# Patient Record
Sex: Female | Born: 1965 | Race: Black or African American | Hispanic: No | Smoking: Former smoker
Health system: Southern US, Community
[De-identification: ages and names within clinical notes are randomized; demographics above are authoritative.]

## PROBLEM LIST (undated history)

## (undated) DIAGNOSIS — F1990 Other psychoactive substance use, unspecified, uncomplicated: Secondary | ICD-10-CM

## (undated) DIAGNOSIS — Z86718 Personal history of other venous thrombosis and embolism: Secondary | ICD-10-CM

## (undated) DIAGNOSIS — F319 Bipolar disorder, unspecified: Secondary | ICD-10-CM

## (undated) DIAGNOSIS — E669 Obesity, unspecified: Secondary | ICD-10-CM

## (undated) DIAGNOSIS — M1711 Unilateral primary osteoarthritis, right knee: Secondary | ICD-10-CM

## (undated) DIAGNOSIS — R569 Unspecified convulsions: Secondary | ICD-10-CM

## (undated) DIAGNOSIS — J189 Pneumonia, unspecified organism: Secondary | ICD-10-CM

## (undated) DIAGNOSIS — M549 Dorsalgia, unspecified: Secondary | ICD-10-CM

## (undated) DIAGNOSIS — R5383 Other fatigue: Secondary | ICD-10-CM

## (undated) DIAGNOSIS — J40 Bronchitis, not specified as acute or chronic: Secondary | ICD-10-CM

## (undated) DIAGNOSIS — F419 Anxiety disorder, unspecified: Secondary | ICD-10-CM

## (undated) DIAGNOSIS — N946 Dysmenorrhea, unspecified: Secondary | ICD-10-CM

## (undated) DIAGNOSIS — E785 Hyperlipidemia, unspecified: Secondary | ICD-10-CM

## (undated) DIAGNOSIS — I10 Essential (primary) hypertension: Secondary | ICD-10-CM

## (undated) DIAGNOSIS — R0602 Shortness of breath: Secondary | ICD-10-CM

## (undated) DIAGNOSIS — M2550 Pain in unspecified joint: Secondary | ICD-10-CM

## (undated) DIAGNOSIS — J101 Influenza due to other identified influenza virus with other respiratory manifestations: Secondary | ICD-10-CM

## (undated) DIAGNOSIS — Z720 Tobacco use: Secondary | ICD-10-CM

## (undated) DIAGNOSIS — R7303 Prediabetes: Secondary | ICD-10-CM

## (undated) DIAGNOSIS — I82402 Acute embolism and thrombosis of unspecified deep veins of left lower extremity: Secondary | ICD-10-CM

## (undated) DIAGNOSIS — F1010 Alcohol abuse, uncomplicated: Secondary | ICD-10-CM

## (undated) DIAGNOSIS — I639 Cerebral infarction, unspecified: Secondary | ICD-10-CM

## (undated) DIAGNOSIS — D179 Benign lipomatous neoplasm, unspecified: Secondary | ICD-10-CM

## (undated) DIAGNOSIS — J441 Chronic obstructive pulmonary disease with (acute) exacerbation: Secondary | ICD-10-CM

## (undated) HISTORY — DX: Alcohol abuse, uncomplicated: F10.10

## (undated) HISTORY — DX: Prediabetes: R73.03

## (undated) HISTORY — DX: Dorsalgia, unspecified: M54.9

## (undated) HISTORY — DX: Shortness of breath: R06.02

## (undated) HISTORY — DX: Essential (primary) hypertension: I10

## (undated) HISTORY — PX: CYSTECTOMY: SUR359

## (undated) HISTORY — DX: Personal history of other venous thrombosis and embolism: Z86.718

## (undated) HISTORY — DX: Benign lipomatous neoplasm, unspecified: D17.9

## (undated) HISTORY — DX: Other psychoactive substance use, unspecified, uncomplicated: F19.90

## (undated) HISTORY — DX: Pain in unspecified joint: M25.50

## (undated) HISTORY — DX: Obesity, unspecified: E66.9

## (undated) HISTORY — DX: Bipolar disorder, unspecified: F31.9

## (undated) HISTORY — PX: TUBAL LIGATION: SHX77

## (undated) HISTORY — DX: Bronchitis, not specified as acute or chronic: J40

## (undated) HISTORY — DX: Unspecified convulsions: R56.9

## (undated) HISTORY — DX: Tobacco use: Z72.0

## (undated) HISTORY — DX: Hyperlipidemia, unspecified: E78.5

## (undated) HISTORY — DX: Cerebral infarction, unspecified: I63.9

## (undated) HISTORY — DX: Unilateral primary osteoarthritis, right knee: M17.11

## (undated) HISTORY — DX: Other fatigue: R53.83

---
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
## 2003-08-10 ENCOUNTER — Encounter: Admission: RE | Admit: 2003-08-10 | Discharge: 2003-08-10 | Payer: Self-pay | Admitting: Internal Medicine

## 2003-10-12 ENCOUNTER — Encounter: Admission: RE | Admit: 2003-10-12 | Discharge: 2003-10-12 | Payer: Self-pay | Admitting: Internal Medicine

## 2003-11-09 ENCOUNTER — Emergency Department (HOSPITAL_COMMUNITY): Admission: EM | Admit: 2003-11-09 | Discharge: 2003-11-09 | Payer: Self-pay | Admitting: Emergency Medicine

## 2005-03-04 ENCOUNTER — Encounter (INDEPENDENT_AMBULATORY_CARE_PROVIDER_SITE_OTHER): Payer: Self-pay | Admitting: *Deleted

## 2005-03-04 LAB — CONVERTED CEMR LAB

## 2005-03-27 ENCOUNTER — Other Ambulatory Visit: Admission: RE | Admit: 2005-03-27 | Discharge: 2005-03-27 | Payer: Self-pay | Admitting: Family Medicine

## 2005-03-27 ENCOUNTER — Ambulatory Visit: Payer: Self-pay | Admitting: Family Medicine

## 2005-04-03 ENCOUNTER — Ambulatory Visit: Payer: Self-pay | Admitting: Family Medicine

## 2005-04-30 ENCOUNTER — Ambulatory Visit: Payer: Self-pay | Admitting: Sports Medicine

## 2005-08-09 ENCOUNTER — Ambulatory Visit: Payer: Self-pay | Admitting: Family Medicine

## 2005-08-17 ENCOUNTER — Emergency Department (HOSPITAL_COMMUNITY): Admission: EM | Admit: 2005-08-17 | Discharge: 2005-08-17 | Payer: Self-pay | Admitting: Emergency Medicine

## 2005-08-30 ENCOUNTER — Ambulatory Visit: Payer: Self-pay | Admitting: Family Medicine

## 2005-08-30 ENCOUNTER — Encounter: Admission: RE | Admit: 2005-08-30 | Discharge: 2005-08-30 | Payer: Self-pay | Admitting: Family Medicine

## 2005-09-12 ENCOUNTER — Ambulatory Visit: Payer: Self-pay | Admitting: Family Medicine

## 2005-11-19 ENCOUNTER — Ambulatory Visit: Payer: Self-pay | Admitting: Family Medicine

## 2006-02-07 ENCOUNTER — Ambulatory Visit: Payer: Self-pay | Admitting: Family Medicine

## 2006-11-12 ENCOUNTER — Ambulatory Visit: Payer: Self-pay | Admitting: Family Medicine

## 2006-11-12 ENCOUNTER — Other Ambulatory Visit: Admission: RE | Admit: 2006-11-12 | Discharge: 2006-11-12 | Payer: Self-pay | Admitting: Family Medicine

## 2006-11-24 ENCOUNTER — Ambulatory Visit: Payer: Self-pay | Admitting: Sports Medicine

## 2006-11-24 ENCOUNTER — Encounter (INDEPENDENT_AMBULATORY_CARE_PROVIDER_SITE_OTHER): Payer: Self-pay | Admitting: Family Medicine

## 2006-11-24 LAB — CONVERTED CEMR LAB
BUN: 10 mg/dL (ref 6–23)
CO2: 22 meq/L (ref 19–32)
Calcium: 9.1 mg/dL (ref 8.4–10.5)
Chloride: 105 meq/L (ref 96–112)
Cholesterol: 209 mg/dL — ABNORMAL HIGH (ref 0–200)
Creatinine, Ser: 0.7 mg/dL (ref 0.40–1.20)
Glucose, Bld: 81 mg/dL (ref 70–99)
HDL: 36 mg/dL — ABNORMAL LOW (ref >39)
LDL Cholesterol: 144 mg/dL — ABNORMAL HIGH (ref 0–99)
Potassium: 4.3 meq/L (ref 3.5–5.3)
Sodium: 140 meq/L (ref 135–145)
Total CHOL/HDL Ratio: 5.8
Triglycerides: 144 mg/dL (ref <150)
VLDL: 29 mg/dL (ref 0–40)

## 2006-12-09 ENCOUNTER — Encounter: Admission: RE | Admit: 2006-12-09 | Discharge: 2006-12-09 | Payer: Self-pay | Admitting: Family Medicine

## 2006-12-10 ENCOUNTER — Ambulatory Visit: Payer: Self-pay | Admitting: Family Medicine

## 2006-12-19 ENCOUNTER — Ambulatory Visit: Payer: Self-pay | Admitting: Family Medicine

## 2007-01-01 DIAGNOSIS — Z6841 Body Mass Index (BMI) 40.0 and over, adult: Secondary | ICD-10-CM

## 2007-01-01 DIAGNOSIS — I10 Essential (primary) hypertension: Secondary | ICD-10-CM | POA: Insufficient documentation

## 2007-01-02 ENCOUNTER — Encounter (INDEPENDENT_AMBULATORY_CARE_PROVIDER_SITE_OTHER): Payer: Self-pay | Admitting: *Deleted

## 2007-01-26 ENCOUNTER — Ambulatory Visit: Payer: Self-pay | Admitting: Family Medicine

## 2007-02-23 ENCOUNTER — Telehealth: Payer: Self-pay | Admitting: *Deleted

## 2007-02-24 ENCOUNTER — Encounter: Admission: RE | Admit: 2007-02-24 | Discharge: 2007-02-24 | Payer: Self-pay | Admitting: Sports Medicine

## 2007-02-24 ENCOUNTER — Ambulatory Visit: Payer: Self-pay | Admitting: Family Medicine

## 2007-02-24 ENCOUNTER — Ambulatory Visit (HOSPITAL_COMMUNITY): Admission: RE | Admit: 2007-02-24 | Discharge: 2007-02-24 | Payer: Self-pay | Admitting: Family Medicine

## 2007-03-03 ENCOUNTER — Telehealth: Payer: Self-pay | Admitting: *Deleted

## 2007-03-05 ENCOUNTER — Ambulatory Visit: Payer: Self-pay | Admitting: Sports Medicine

## 2007-03-19 ENCOUNTER — Ambulatory Visit: Payer: Self-pay | Admitting: Family Medicine

## 2007-03-23 ENCOUNTER — Ambulatory Visit: Payer: Self-pay | Admitting: Sports Medicine

## 2007-03-23 ENCOUNTER — Telehealth (INDEPENDENT_AMBULATORY_CARE_PROVIDER_SITE_OTHER): Payer: Self-pay | Admitting: *Deleted

## 2007-03-23 LAB — CONVERTED CEMR LAB: Rapid Strep: POSITIVE

## 2007-04-02 ENCOUNTER — Ambulatory Visit: Payer: Self-pay | Admitting: Family Medicine

## 2007-04-30 ENCOUNTER — Ambulatory Visit: Payer: Self-pay | Admitting: Sports Medicine

## 2007-04-30 ENCOUNTER — Encounter (INDEPENDENT_AMBULATORY_CARE_PROVIDER_SITE_OTHER): Payer: Self-pay | Admitting: Family Medicine

## 2007-04-30 DIAGNOSIS — E785 Hyperlipidemia, unspecified: Secondary | ICD-10-CM | POA: Insufficient documentation

## 2007-04-30 LAB — CONVERTED CEMR LAB
Cholesterol: 199 mg/dL (ref 0–200)
HDL: 33 mg/dL — ABNORMAL LOW (ref >39)
LDL Cholesterol: 126 mg/dL — ABNORMAL HIGH (ref 0–99)
Total CHOL/HDL Ratio: 6
Triglycerides: 200 mg/dL — ABNORMAL HIGH (ref <150)
VLDL: 40 mg/dL (ref 0–40)

## 2007-05-05 ENCOUNTER — Ambulatory Visit: Payer: Self-pay | Admitting: Sports Medicine

## 2007-05-06 ENCOUNTER — Encounter (INDEPENDENT_AMBULATORY_CARE_PROVIDER_SITE_OTHER): Payer: Self-pay | Admitting: Family Medicine

## 2007-05-22 ENCOUNTER — Encounter (INDEPENDENT_AMBULATORY_CARE_PROVIDER_SITE_OTHER): Payer: Self-pay | Admitting: Family Medicine

## 2007-06-22 ENCOUNTER — Emergency Department (HOSPITAL_COMMUNITY): Admission: EM | Admit: 2007-06-22 | Discharge: 2007-06-22 | Payer: Self-pay | Admitting: Emergency Medicine

## 2007-09-02 ENCOUNTER — Ambulatory Visit: Payer: Self-pay | Admitting: Family Medicine

## 2007-09-21 ENCOUNTER — Ambulatory Visit: Payer: Self-pay | Admitting: Family Medicine

## 2007-11-03 ENCOUNTER — Ambulatory Visit: Payer: Self-pay | Admitting: Family Medicine

## 2007-11-10 ENCOUNTER — Encounter (INDEPENDENT_AMBULATORY_CARE_PROVIDER_SITE_OTHER): Payer: Self-pay | Admitting: *Deleted

## 2007-11-13 ENCOUNTER — Telehealth: Payer: Self-pay | Admitting: *Deleted

## 2007-11-23 ENCOUNTER — Encounter (INDEPENDENT_AMBULATORY_CARE_PROVIDER_SITE_OTHER): Payer: Self-pay | Admitting: Family Medicine

## 2007-11-23 ENCOUNTER — Ambulatory Visit: Payer: Self-pay | Admitting: Sports Medicine

## 2007-11-30 ENCOUNTER — Ambulatory Visit: Payer: Self-pay | Admitting: Family Medicine

## 2007-12-07 ENCOUNTER — Telehealth: Payer: Self-pay | Admitting: Psychology

## 2007-12-08 LAB — CONVERTED CEMR LAB
Glucose, Bld: 83 mg/dL (ref 70–99)
Triglycerides: 173 mg/dL — ABNORMAL HIGH (ref <150)
Vit D, 1,25-Dihydroxy: 8 — ABNORMAL LOW (ref 30–89)

## 2007-12-10 ENCOUNTER — Ambulatory Visit: Payer: Self-pay | Admitting: Family Medicine

## 2007-12-10 ENCOUNTER — Encounter: Payer: Self-pay | Admitting: Family Medicine

## 2007-12-14 ENCOUNTER — Ambulatory Visit: Payer: Self-pay | Admitting: Family Medicine

## 2007-12-14 ENCOUNTER — Encounter: Admission: RE | Admit: 2007-12-14 | Discharge: 2007-12-14 | Payer: Self-pay | Admitting: Family Medicine

## 2007-12-21 ENCOUNTER — Ambulatory Visit: Payer: Self-pay | Admitting: Sports Medicine

## 2007-12-21 ENCOUNTER — Encounter (INDEPENDENT_AMBULATORY_CARE_PROVIDER_SITE_OTHER): Payer: Self-pay | Admitting: Family Medicine

## 2007-12-31 ENCOUNTER — Ambulatory Visit: Payer: Self-pay | Admitting: Family Medicine

## 2008-01-05 ENCOUNTER — Telehealth: Payer: Self-pay | Admitting: Psychology

## 2008-01-18 ENCOUNTER — Telehealth: Payer: Self-pay | Admitting: Psychology

## 2008-01-20 ENCOUNTER — Encounter (INDEPENDENT_AMBULATORY_CARE_PROVIDER_SITE_OTHER): Payer: Self-pay | Admitting: Family Medicine

## 2008-01-20 ENCOUNTER — Ambulatory Visit: Payer: Self-pay | Admitting: Family Medicine

## 2008-01-20 DIAGNOSIS — D179 Benign lipomatous neoplasm, unspecified: Secondary | ICD-10-CM | POA: Insufficient documentation

## 2008-01-20 HISTORY — DX: Benign lipomatous neoplasm, unspecified: D17.9

## 2008-01-21 ENCOUNTER — Telehealth (INDEPENDENT_AMBULATORY_CARE_PROVIDER_SITE_OTHER): Payer: Self-pay | Admitting: Family Medicine

## 2008-01-25 ENCOUNTER — Encounter: Payer: Self-pay | Admitting: *Deleted

## 2008-02-01 ENCOUNTER — Ambulatory Visit: Payer: Self-pay | Admitting: Family Medicine

## 2008-02-04 ENCOUNTER — Ambulatory Visit: Payer: Self-pay | Admitting: Family Medicine

## 2008-02-15 ENCOUNTER — Encounter: Payer: Self-pay | Admitting: Psychology

## 2008-03-02 ENCOUNTER — Telehealth (INDEPENDENT_AMBULATORY_CARE_PROVIDER_SITE_OTHER): Payer: Self-pay | Admitting: *Deleted

## 2008-04-19 ENCOUNTER — Ambulatory Visit: Payer: Self-pay | Admitting: Family Medicine

## 2008-05-11 ENCOUNTER — Encounter (INDEPENDENT_AMBULATORY_CARE_PROVIDER_SITE_OTHER): Payer: Self-pay | Admitting: *Deleted

## 2008-05-23 ENCOUNTER — Telehealth (INDEPENDENT_AMBULATORY_CARE_PROVIDER_SITE_OTHER): Payer: Self-pay | Admitting: Family Medicine

## 2008-05-23 ENCOUNTER — Encounter: Payer: Self-pay | Admitting: *Deleted

## 2008-06-02 ENCOUNTER — Encounter (INDEPENDENT_AMBULATORY_CARE_PROVIDER_SITE_OTHER): Payer: Self-pay | Admitting: *Deleted

## 2008-06-02 ENCOUNTER — Ambulatory Visit: Payer: Self-pay | Admitting: Family Medicine

## 2008-07-12 ENCOUNTER — Encounter (INDEPENDENT_AMBULATORY_CARE_PROVIDER_SITE_OTHER): Payer: Self-pay | Admitting: Family Medicine

## 2008-07-12 ENCOUNTER — Other Ambulatory Visit: Admission: RE | Admit: 2008-07-12 | Discharge: 2008-07-12 | Payer: Self-pay | Admitting: Family Medicine

## 2008-07-12 ENCOUNTER — Ambulatory Visit: Payer: Self-pay | Admitting: Family Medicine

## 2008-07-12 LAB — CONVERTED CEMR LAB
Chlamydia, DNA Probe: NEGATIVE
GC Probe Amp, Genital: NEGATIVE
Pap Smear: NORMAL

## 2008-07-13 ENCOUNTER — Encounter (INDEPENDENT_AMBULATORY_CARE_PROVIDER_SITE_OTHER): Payer: Self-pay | Admitting: Family Medicine

## 2008-07-14 ENCOUNTER — Ambulatory Visit: Payer: Self-pay | Admitting: Family Medicine

## 2008-07-14 ENCOUNTER — Encounter (INDEPENDENT_AMBULATORY_CARE_PROVIDER_SITE_OTHER): Payer: Self-pay | Admitting: Family Medicine

## 2008-07-25 ENCOUNTER — Encounter (INDEPENDENT_AMBULATORY_CARE_PROVIDER_SITE_OTHER): Payer: Self-pay | Admitting: Family Medicine

## 2008-07-25 LAB — CONVERTED CEMR LAB
Cholesterol: 196 mg/dL (ref 0–200)
HDL: 40 mg/dL (ref >39)
LDL Cholesterol: 125 mg/dL — ABNORMAL HIGH (ref 0–99)
Total CHOL/HDL Ratio: 4.9
Triglycerides: 155 mg/dL — ABNORMAL HIGH (ref <150)
VLDL: 31 mg/dL (ref 0–40)
Vit D, 1,25-Dihydroxy: 24 — ABNORMAL LOW (ref 30–89)

## 2008-08-30 ENCOUNTER — Ambulatory Visit: Payer: Self-pay | Admitting: Family Medicine

## 2008-10-03 ENCOUNTER — Telehealth: Payer: Self-pay | Admitting: *Deleted

## 2008-11-15 ENCOUNTER — Telehealth (INDEPENDENT_AMBULATORY_CARE_PROVIDER_SITE_OTHER): Payer: Self-pay | Admitting: Family Medicine

## 2008-11-16 ENCOUNTER — Telehealth: Payer: Self-pay | Admitting: *Deleted

## 2008-11-18 ENCOUNTER — Telehealth: Payer: Self-pay | Admitting: *Deleted

## 2008-12-07 ENCOUNTER — Telehealth (INDEPENDENT_AMBULATORY_CARE_PROVIDER_SITE_OTHER): Payer: Self-pay | Admitting: Family Medicine

## 2008-12-28 ENCOUNTER — Telehealth (INDEPENDENT_AMBULATORY_CARE_PROVIDER_SITE_OTHER): Payer: Self-pay | Admitting: Family Medicine

## 2009-01-04 ENCOUNTER — Ambulatory Visit: Payer: Self-pay | Admitting: Family Medicine

## 2009-01-13 ENCOUNTER — Telehealth: Payer: Self-pay | Admitting: *Deleted

## 2009-03-21 ENCOUNTER — Ambulatory Visit: Payer: Self-pay | Admitting: Family Medicine

## 2009-03-21 ENCOUNTER — Encounter (INDEPENDENT_AMBULATORY_CARE_PROVIDER_SITE_OTHER): Payer: Self-pay | Admitting: Family Medicine

## 2009-03-21 LAB — CONVERTED CEMR LAB: Whiff Test: POSITIVE

## 2009-03-22 LAB — CONVERTED CEMR LAB
Chlamydia, DNA Probe: NEGATIVE
GC Probe Amp, Genital: NEGATIVE

## 2009-04-19 ENCOUNTER — Ambulatory Visit (HOSPITAL_COMMUNITY): Admission: RE | Admit: 2009-04-19 | Discharge: 2009-04-19 | Payer: Self-pay | Admitting: Family Medicine

## 2009-04-19 ENCOUNTER — Ambulatory Visit: Payer: Self-pay | Admitting: Family Medicine

## 2009-04-20 ENCOUNTER — Encounter (INDEPENDENT_AMBULATORY_CARE_PROVIDER_SITE_OTHER): Payer: Self-pay | Admitting: Family Medicine

## 2009-04-21 ENCOUNTER — Telehealth (INDEPENDENT_AMBULATORY_CARE_PROVIDER_SITE_OTHER): Payer: Self-pay | Admitting: Family Medicine

## 2009-05-17 ENCOUNTER — Telehealth: Payer: Self-pay | Admitting: Family Medicine

## 2009-05-18 ENCOUNTER — Telehealth (INDEPENDENT_AMBULATORY_CARE_PROVIDER_SITE_OTHER): Payer: Self-pay | Admitting: *Deleted

## 2009-05-22 ENCOUNTER — Ambulatory Visit: Payer: Self-pay | Admitting: Family Medicine

## 2009-06-26 ENCOUNTER — Ambulatory Visit: Payer: Self-pay | Admitting: Family Medicine

## 2009-07-25 ENCOUNTER — Telehealth: Payer: Self-pay | Admitting: Family Medicine

## 2009-08-23 ENCOUNTER — Ambulatory Visit: Payer: Self-pay | Admitting: Family Medicine

## 2009-08-23 DIAGNOSIS — Z87891 Personal history of nicotine dependence: Secondary | ICD-10-CM | POA: Insufficient documentation

## 2009-08-25 ENCOUNTER — Telehealth: Payer: Self-pay | Admitting: *Deleted

## 2009-09-21 ENCOUNTER — Telehealth: Payer: Self-pay | Admitting: Family Medicine

## 2009-10-02 ENCOUNTER — Telehealth: Payer: Self-pay | Admitting: Family Medicine

## 2009-11-09 ENCOUNTER — Ambulatory Visit: Payer: Self-pay | Admitting: Family Medicine

## 2009-12-12 ENCOUNTER — Telehealth: Payer: Self-pay | Admitting: Family Medicine

## 2009-12-14 ENCOUNTER — Ambulatory Visit: Payer: Self-pay | Admitting: Family Medicine

## 2009-12-15 ENCOUNTER — Ambulatory Visit: Payer: Self-pay | Admitting: Vascular Surgery

## 2009-12-15 ENCOUNTER — Ambulatory Visit (HOSPITAL_COMMUNITY): Admission: RE | Admit: 2009-12-15 | Discharge: 2009-12-15 | Payer: Self-pay | Admitting: Family Medicine

## 2009-12-15 ENCOUNTER — Encounter (INDEPENDENT_AMBULATORY_CARE_PROVIDER_SITE_OTHER): Payer: Self-pay | Admitting: Nephrology

## 2009-12-18 ENCOUNTER — Ambulatory Visit: Payer: Self-pay | Admitting: Family Medicine

## 2010-01-22 ENCOUNTER — Telehealth: Payer: Self-pay | Admitting: Family Medicine

## 2010-02-14 ENCOUNTER — Encounter: Payer: Self-pay | Admitting: Family Medicine

## 2010-02-14 ENCOUNTER — Ambulatory Visit: Payer: Self-pay | Admitting: Family Medicine

## 2010-02-14 ENCOUNTER — Encounter: Admission: RE | Admit: 2010-02-14 | Discharge: 2010-02-14 | Payer: Self-pay | Admitting: General Surgery

## 2010-02-14 ENCOUNTER — Ambulatory Visit (HOSPITAL_COMMUNITY): Admission: RE | Admit: 2010-02-14 | Discharge: 2010-02-14 | Payer: Self-pay | Admitting: Family Medicine

## 2010-02-16 ENCOUNTER — Telehealth: Payer: Self-pay | Admitting: Family Medicine

## 2010-02-16 LAB — CONVERTED CEMR LAB
ALT: 10 units/L (ref 0–35)
AST: 11 units/L (ref 0–37)
Albumin: 3.7 g/dL (ref 3.5–5.2)
Alkaline Phosphatase: 45 units/L (ref 39–117)
BUN: 10 mg/dL (ref 6–23)
CO2: 20 meq/L (ref 19–32)
Calcium: 9.4 mg/dL (ref 8.4–10.5)
Chloride: 105 meq/L (ref 96–112)
Cholesterol: 202 mg/dL — ABNORMAL HIGH (ref 0–200)
Creatinine, Ser: 0.67 mg/dL (ref 0.40–1.20)
Glucose, Bld: 95 mg/dL (ref 70–99)
HCT: 38.3 % (ref 36.0–46.0)
HDL: 37 mg/dL — ABNORMAL LOW (ref >39)
Hemoglobin: 12 g/dL (ref 12.0–15.0)
LDL Cholesterol: 129 mg/dL — ABNORMAL HIGH (ref 0–99)
MCHC: 31.3 g/dL (ref 30.0–36.0)
MCV: 79.1 fL (ref 78.0–100.0)
Platelets: 400 10*3/uL (ref 150–400)
Potassium: 4.3 meq/L (ref 3.5–5.3)
RBC: 4.84 M/uL (ref 3.87–5.11)
RDW: 16.2 % — ABNORMAL HIGH (ref 11.5–15.5)
Sodium: 140 meq/L (ref 135–145)
Total Bilirubin: 0.3 mg/dL (ref 0.3–1.2)
Total CHOL/HDL Ratio: 5.5
Total Protein: 6.8 g/dL (ref 6.0–8.3)
Triglycerides: 180 mg/dL — ABNORMAL HIGH (ref <150)
VLDL: 36 mg/dL (ref 0–40)
WBC: 10.7 10*3/uL — ABNORMAL HIGH (ref 4.0–10.5)

## 2010-02-19 ENCOUNTER — Encounter: Payer: Self-pay | Admitting: Family Medicine

## 2010-04-10 ENCOUNTER — Ambulatory Visit: Payer: Self-pay | Admitting: Family Medicine

## 2010-06-25 ENCOUNTER — Encounter: Payer: Self-pay | Admitting: Family Medicine

## 2010-07-03 ENCOUNTER — Ambulatory Visit: Payer: Self-pay | Admitting: Family Medicine

## 2010-07-03 DIAGNOSIS — F319 Bipolar disorder, unspecified: Secondary | ICD-10-CM | POA: Insufficient documentation

## 2010-07-10 ENCOUNTER — Telehealth: Payer: Self-pay | Admitting: Psychology

## 2010-07-20 ENCOUNTER — Ambulatory Visit: Payer: Self-pay | Admitting: Family Medicine

## 2010-07-20 ENCOUNTER — Encounter: Payer: Self-pay | Admitting: Family Medicine

## 2010-07-20 ENCOUNTER — Other Ambulatory Visit: Admission: RE | Admit: 2010-07-20 | Discharge: 2010-07-20 | Payer: Self-pay | Admitting: Family Medicine

## 2010-07-20 LAB — CONVERTED CEMR LAB
Chlamydia, DNA Probe: NEGATIVE
GC Probe Amp, Genital: NEGATIVE
Pap Smear: NEGATIVE
Whiff Test: POSITIVE

## 2010-07-23 ENCOUNTER — Telehealth: Payer: Self-pay | Admitting: *Deleted

## 2010-07-24 ENCOUNTER — Encounter: Payer: Self-pay | Admitting: Family Medicine

## 2010-07-24 ENCOUNTER — Telehealth: Payer: Self-pay | Admitting: *Deleted

## 2010-08-24 ENCOUNTER — Telehealth: Payer: Self-pay | Admitting: *Deleted

## 2010-09-04 ENCOUNTER — Encounter: Payer: Self-pay | Admitting: Family Medicine

## 2010-09-13 ENCOUNTER — Telehealth: Payer: Self-pay | Admitting: *Deleted

## 2010-09-20 ENCOUNTER — Telehealth: Payer: Self-pay | Admitting: Family Medicine

## 2010-11-13 ENCOUNTER — Encounter: Payer: Self-pay | Admitting: Family Medicine

## 2010-11-13 ENCOUNTER — Ambulatory Visit: Admission: RE | Admit: 2010-11-13 | Discharge: 2010-11-13 | Payer: Self-pay | Source: Home / Self Care

## 2010-11-13 LAB — CONVERTED CEMR LAB
BUN: 7 mg/dL (ref 6–23)
CO2: 26 meq/L (ref 19–32)
Calcium: 9.4 mg/dL (ref 8.4–10.5)
Chloride: 103 meq/L (ref 96–112)
Creatinine, Ser: 0.63 mg/dL (ref 0.40–1.20)
Glucose, Bld: 87 mg/dL (ref 70–99)
Potassium: 3.9 meq/L (ref 3.5–5.3)
Sodium: 140 meq/L (ref 135–145)

## 2010-11-25 ENCOUNTER — Encounter: Payer: Self-pay | Admitting: Family Medicine

## 2010-11-26 ENCOUNTER — Encounter: Payer: Self-pay | Admitting: Family Medicine

## 2010-11-27 ENCOUNTER — Ambulatory Visit: Admit: 2010-11-27 | Payer: Self-pay

## 2010-12-04 NOTE — Miscellaneous (Signed)
Summary: Orders Update  Clinical Lists Changes  Problems: Added new problem of UNSPECIFIED SUBJECTIVE VISUAL DISTURBANCE (ICD-368.10) Orders: Added new Referral order of Ophthalmology Referral (Ophthalmology) - Signed

## 2010-12-04 NOTE — Miscellaneous (Signed)
Summary: crying  Clinical Lists Changes called crying. states she is having a hard time x 1 wk. states she does not have a hx of depression. missed her appt with Dr. Jeanice Lim. told her we can reschedule. (later said she was on wellbutrin once)  she has separated from her husband 6 months ago after 16 years.  He left her. states there is no hope of reconciliation. she is also out of work. has a 45 yr old dtr. states dtr has been "fine". has 3 other children who are grown & out of home. mother & ex are helping "a little" financially.  states she can talk to her mom. has her best friend to talk to as well.  sleeping very little. does not get out of bed during the day. poor appetite. drinks ok. does not smoke. will drink beer at times. had one yesterday. rarely drinks more than 2. I explained that alcohol is a depressant. asked her to not drink any. states she is out of it anyway. used to walk for exercise. does not remember the last time she was out of the house. does not cook meals. dtr will fix if she wants something. she does have food in the house.   she denied SI or HI. will listen to music or watch tv at times. declined OV today. wants her pcp & has to find a way to get here as she does not drive.  Appt made for tomorrow with pcp. told her she is free to call back any time or come in. if she needs to speak with a doctor after we are closed, we do have someone on call at all times. told her about the [facility] or [facility] ED if she gets worse or is suicidal.  asked her to go get dressed, eat something and step outside for a few minutes to get some fresh air. when she comes back in pull out something for dinner & call her friend or mom. states she is going to call her friend. mom & friend are aware of her situation & are supportive per pt.  she was no longer crying at this point. praised her for the strengths she is showing- asking for help, keeping friends & family close, trying to not let her dtr know  how bad she feels. she is taking her bp meds as directed. she promised to keep appt & reach out for more help if she feels worse.Marland KitchenMarland KitchenGolden Circle RN  June 25, 2010 10:50 AM

## 2010-12-04 NOTE — Miscellaneous (Signed)
Summary: NPI for surgical consult  Clinical Lists Changes they are sseing her again for evaluation of abd mass & o=pain. gave npi for one visit. asked that she fax all OV notes to the pcp here. asked that she ask md if surgery is in the plan or not contemplated at this time. we are giving npi regularly. md wants to know the plan.Golden Circle RN  February 19, 2010 3:20 PM

## 2010-12-04 NOTE — Progress Notes (Signed)
Summary: re: labs  ---- Converted from flag ---- ---- 07/23/2010 2:00 PM, Milinda Antis MD wrote: Please call pt today and let her know her HIV test was negative Her test for Gonorrhea and Chlamydia was negative ------------------------------  attemtted to call pt, no answer no voicemailpt has upcoming appt 07-25-10 in [facility].Arlyss Repress CMA,  July 23, 2010 4:31 PM

## 2010-12-04 NOTE — Progress Notes (Signed)
Summary: phn msg  Phone Note Call from Patient Call back at [facility] Phone [phone]   Caller: Patient Summary of Call: Pt returning phone call from yesterday. Initial call taken by: Clydell Hakim,  July 24, 2010 9:10 AM  Follow-up for Phone Call        informed pt of neg labs Follow-up by: Arlyss Repress CMA,,  July 24, 2010 10:12 AM

## 2010-12-04 NOTE — Assessment & Plan Note (Signed)
Summary: f/u,df   Vital Signs:  Patient profile:   45 year old female Weight:      296.4 pounds BMI:     51.06 Temp:     98.1 degrees F Pulse rate:   71 / minute BP sitting:   143 / 87  Vitals Entered By: Starleen Blue RN (April 10, 2010 2:55 PM)  Serial Vital Signs/Assessments:  Time      Position  BP       Pulse  Resp  Temp     By 3:33 PM             122/78                         Loralee Pacas CMA  CC: f/y bp Is Patient Diabetic? No Pain Assessment Patient in pain? no        Primary Care Provider:  Milinda Antis MD  CC:  f/y bp.  History of Present Illness:  1.Weight loss- not using the Alli system, avoid fatty foods, exercising 1 hr - cardio, tennis.Marland Kitchen Work-outs with mother and daughter, plans to see Dr. Gerilyn Pilgrim on 14th  Eating fruit or activa yogurt for snacks, eating breakfast, lunch, dinner, now drinking milk , if hungry in middle of the night eating carrots-- down 5lbs today   2.HTN- tolerating medications, no side effects, no chest pain, no headache.      Habits & Providers  Alcohol-Tobacco-Diet     Tobacco Status: never  Current Medications (verified): 1)  Hydrochlorothiazide 25 Mg Tabs (Hydrochlorothiazide) .Marland Kitchen.. 1 By Mouth Daily  Allergies (verified): No Known Drug Allergies  Social History: Smoking Status:  never  Physical Exam  General:  Obese, NAD , AXOX3  Lungs:  CTAB Heart:  RRR, no murmur Pulses:  2+ radial, DP   Impression & Recommendations:  Problem # 1:  HYPERTENSION, BENIGN SYSTEMIC (ICD-401.1) Assessment Improved  no change in meds, use large cuff for adequate readings Her updated medication list for this problem includes:    Hydrochlorothiazide 25 Mg Tabs (Hydrochlorothiazide) .Marland Kitchen... 1 by mouth daily  Orders: [facility]- Est Level  3 ([address])  Problem # 2:  OBESITY, NOS (ICD-278.00) Assessment: Improved  Not usiing diet system as previously discussed, decided to try on her own as she could not afford . RTC with Dr. Gerilyn Pilgrim for  nutrition  Orders: [facility]- Est Level  3 ([address])  Complete Medication List: 1)  Hydrochlorothiazide 25 Mg Tabs (Hydrochlorothiazide) .Marland Kitchen.. 1 by mouth daily  Patient Instructions: 1)  Please follow-up your PAP Smear 2)  Please get your Mammogram 3)  I am very proud of your weight loss, keep up the good work. 4)  No change to your medications today 5)  Repeat blood work will be in the fall.    Prevention & Chronic Care Immunizations   Influenza vaccine: Fluvax 3+  (08/30/2008)   Influenza vaccine due: 09/01/2008    Tetanus booster: 08/04/2005: Done.   Tetanus booster due: 08/05/2015    Pneumococcal vaccine: Not documented  Other Screening   Pap smear: normal  (07/12/2008)   Pap smear due: 07/2009    Mammogram: normal  (12/15/2007)   Mammogram due: 12/14/2008   Smoking status: never  (04/10/2010)  Lipids   Total Cholesterol: 202  (02/14/2010)   LDL: 129  (02/14/2010)   LDL Direct: Not documented   HDL: 37  (02/14/2010)   Triglycerides: 180  (02/14/2010)    SGOT (AST): 11  (02/14/2010)  SGPT (ALT): 10  (02/14/2010)   Alkaline phosphatase: 45  (02/14/2010)   Total bilirubin: 0.3  (02/14/2010)  Hypertension   Last Blood Pressure: 143 / 87  (04/10/2010)   Serum creatinine: 0.67  (02/14/2010)   Serum potassium 4.3  (02/14/2010)    Hypertension flowsheet reviewed?: Yes   Progress toward BP goal: At goal  Self-Management Support :   Personal Goals (by the next clinic visit) :      Personal blood pressure goal: 140/90  (12/14/2009)     Personal LDL goal: 130  (04/10/2010)    Hypertension self-management support: Not documented    Lipid self-management support: Not documented

## 2010-12-04 NOTE — Progress Notes (Signed)
Summary: Rx Req  Phone Note Refill Request Call back at [phone] Message from:  Patient  Refills Requested: Medication #1:  NORVASC 10 MG TABS 1 by mouth daily for high blood pressure PT USES [facility] [address].  Initial call taken by: Clydell Hakim,  December 12, 2009 9:31 AM  Follow-up for Phone Call        will forward to MD. Follow-up by: Theresia Lo RN,  December 12, 2009 11:25 AM    Prescriptions: NORVASC 10 MG TABS (AMLODIPINE BESYLATE) 1 by mouth daily for high blood pressure  #30 x 2   Entered and Authorized by:   Milinda Antis MD   Signed by:   Milinda Antis MD on 12/12/2009   Method used:   Electronically to        [facility] [address]. [phone]* (retail)       [address] [place], [place]  [address]       Ph: [phone]       Fax: [phone]   RxID:   [id number]

## 2010-12-04 NOTE — Progress Notes (Signed)
  Phone Note Call from Patient   Caller: Patient Call For: [phone] Summary of Call: Patient calling regarding referral to Opthamologist.  Referral was put in but no appt showing.  Patient would like for nurse or provider to contact Dr. Alexandria Lodge office because they won't see her without it. Initial call taken by: Abundio Miu,  September 13, 2010 2:03 PM  Follow-up for Phone Call        called pt, told her that I would call her back this afternoon with the appt information after I schedule it. Follow-up by: Tessie Fass CMA,  September 14, 2010 11:58 AM  Additional Follow-up for Phone Call Additional follow up Details #1::        office closes early on firday's will schedule on monday Additional Follow-up by: Tessie Fass CMA,  September 14, 2010 3:10 PM    Additional Follow-up for Phone Call Additional follow up Details #2::    appt scheduled with Dr Emily Filbert 09/19/10 @ 2:15. pt ntified. referral faxed. Follow-up by: Tessie Fass CMA,  September 17, 2010 9:33 AM
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
## 2010-12-04 NOTE — Assessment & Plan Note (Signed)
Summary: f/up,tcb   Vital Signs:  Patient profile:   45 year old female Height:      64 inches Weight:      300 pounds BMI:     51.68 Temp:     97.7 degrees F oral Pulse rate:   76 / minute BP sitting:   149 / 79  (left arm) Cuff size:   large  Vitals Entered By: Tessie Fass CMA (December 14, 2009 2:19 PM) CC: F/U BP meds/ abdominal mass Is Patient Diabetic? No Pain Assessment Patient in pain? no        Primary Care Provider:  Levander Campion MD  CC:  F/U BP meds/ abdominal mass.  History of Present Illness:  Phone [phone] 1. HTN- out of BP meds x 2 days, complains of leg swelling which started shortly after starting new med, libido increased but pt now seperated from husband.  Right leg swells > than left  ROS- occasional SOB with exertion which is new for her, no HA, no chest pain  2. Abdominal mass- stated has had a mass in her epigastric region for approx 1 year. 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 Now has intermittant pain over mass that radiates to RUQ, pain not associated with food or exertion, denies protrusion of the mass. No chang einbowels, no change in size of mass noted  Quit smoking 3 months ago  Habits & Providers  Alcohol-Tobacco-Diet     Tobacco Status: quit  Current Medications (verified): 1)  Hydrochlorothiazide 25 Mg Tabs (Hydrochlorothiazide) .Marland Kitchen.. 1 By Mouth Daily 2)  Ativan 1 Mg Tabs (Lorazepam) .Marland Kitchen.. 1 By Mouth Prior To Procedure  Allergies (verified): No Known Drug Allergies  Social History: Smoking Status:  quit  Physical Exam  General:  Obese, NAD , AXOX3 Eyes:  EOMI. Perrla. Funduscopic exam benign, without hemorrhages, exudates or papilledema.  Lungs:  CTAB Heart:  RRR, no murmur Abdomen:  soft and normal bowel sounds.  3-4cm mass in epigastric region, non mobile , TTP, neg rosvings, no protrusion, unable to cause protrusion with valsalvar  maneuver Extremities:  +1 non pitting edema R Trace edema Calf NT   Impression & Recommendations:  Problem # 1:  HYPERTENSION, BENIGN SYSTEMIC (ICD-401.1) Assessment Deteriorated  Will d/c norvasc in setting of recent leg swelling. Discussed with pt other meds, states because she is seperated will return to HCTZ- she is not concerned with libido. BMET next visit The following medications were removed from the medication list:    Norvasc 10 Mg Tabs (Amlodipine besylate) .Marland Kitchen... 1 by mouth daily for high blood pressure Her updated medication list for this problem includes:    Hydrochlorothiazide 25 Mg Tabs (Hydrochlorothiazide) .Marland Kitchen... 1 by mouth daily  Orders: [facility]- Est  Level 4 ([address])  Problem # 2:  EDEMA LEG (ICD-782.3) Assessment: New Concern for DVT with more unliateral leg swelling and h/o SOB, other differential medication induced Her updated medication list for this problem includes:    Hydrochlorothiazide 25 Mg Tabs (Hydrochlorothiazide) .Marland Kitchen... 1 by mouth daily  Orders: Radiology other (Radiology Other) [facility]- Est  Level 4 ([address]34)  Problem # 3:  ABDOMINAL MASS (ICD-789.30) Assessment: Deteriorated Patient with abdominal pain at site of mass, diff lipoma, hernia, sarcoma. Send to surgery for evaluation, hold on imaging allow surgeon to decide Orders: Surgical Referral (Surgery) [facility]- Est  Level 4 ([address]259)  Complete Medication List: 1)  Hydrochlorothiazide 25 Mg  Tabs (Hydrochlorothiazide) .Marland Kitchen.. 1 by mouth daily 2)  Ativan 1 Mg Tabs (Lorazepam) .Marland Kitchen.. 1 by mouth prior to procedure  Patient Instructions: 1)  I will refer you to [place] [facility] and let them decide on which imaging study to do 2)  For your blood pressure please start the HCTZ once a day 3)  For your legs, get the ultrasound and I will call you with results 4)  Please update your phone number at the front desk 5)  Please schedule a follow-up appointment in 1 month for blood pressure Prescriptions: ATIVAN  1 MG TABS (LORAZEPAM) 1 by mouth prior to procedure  #2 x 0   Entered and Authorized by:   Milinda Antis MD   Signed by:   Milinda Antis MD on 12/14/2009   Method used:   Handwritten   RxID:   [id number] HYDROCHLOROTHIAZIDE 25 MG TABS (HYDROCHLOROTHIAZIDE) 1 by mouth daily  #31 x 3   Entered and Authorized by:   Milinda Antis MD   Signed by:   Milinda Antis MD on 12/14/2009   Method used:   Electronically to        [facility]. [phone]* (retail)       [address]       [place], [place]  [address]       Ph: [phone]       Fax: [phone]   RxID:   [id number]    Prevention & Chronic Care Immunizations   Influenza vaccine: Fluvax 3+  (08/30/2008)   Influenza vaccine due: 09/01/2008    Tetanus booster: 08/04/2005: Done.   Tetanus booster due: 08/05/2015    Pneumococcal vaccine: Not documented  Other Screening   Pap smear: normal  (07/12/2008)   Pap smear due: 07/2009    Mammogram: normal  (12/15/2007)   Mammogram due: 12/14/2008   Smoking status: quit  (12/14/2009)  Lipids   Total Cholesterol: 196  (07/14/2008)   LDL: 125  (07/14/2008)   LDL Direct: Not documented   HDL: 40  (07/14/2008)   Triglycerides: 155  (07/14/2008)    SGOT (AST): Not documented   SGPT (ALT): Not documented   Alkaline phosphatase: Not documented   Total bilirubin: Not documented  Hypertension   Last Blood Pressure: 149 / 79  (12/14/2009)   Serum creatinine: 0.70  (11/24/2006)   Serum potassium 4.3  (11/24/2006)    Hypertension flowsheet reviewed?: Yes   Progress toward BP goal: Deteriorated  Self-Management Support :   Personal Goals (by the next clinic visit) :      Personal blood pressure goal: 140/90  (12/14/2009)     Personal LDL goal: 100  (12/14/2009)    Hypertension self-management support: Not documented    Lipid self-management support: Not documented

## 2010-12-04 NOTE — Assessment & Plan Note (Signed)
Summary: f/up,tcb   Vital Signs:  Patient profile:   45 year old female Height:      64 inches Weight:      301 pounds BMI:     51.85 Temp:     98.6 degrees F oral Pulse rate:   80 / minute BP sitting:   160 / 80  (right arm) Cuff size:   large  Vitals Entered By: Tessie Fass CMA (February 14, 2010 11:38 AM) CC: F/U BP meds Is Patient Diabetic? No Pain Assessment Patient in pain? no        Primary Care Provider:  Milinda Antis MD  CC:  F/U BP meds.  History of Present Illness:   HTN- tolerating medications, no side effects, no chest pain, no headache. No HCTZ taken today secondary to imaging study, pt told to be NPO   Abdominal mass- pt to be evaluated by surgeon, imaging scheduled today  Weight loss- continues to follow-up with nutrition and exercises 3x a week with waking and weight tones,  now smoke free for 5  months, pt very upset as she has not lost any weight even with the measures above, she wants to try Alli     Leg swelling- no persistant swelling, Dopplers negative for DVT in Feb 2011    Habits & Providers  Alcohol-Tobacco-Diet     Tobacco Status: quit  Current Medications (verified): 1)  Hydrochlorothiazide 25 Mg Tabs (Hydrochlorothiazide) .Marland Kitchen.. 1 By Mouth Daily  Allergies (verified): No Known Drug Allergies  Review of Systems       Per HPI  Physical Exam  General:  Obese, NAD , AXOX3  Lungs:  CTAB Heart:  RRR, no murmur Extremities:  no edema pulses regular 2+   Impression & Recommendations:  Problem # 1:  HYPERTENSION, BENIGN SYSTEMIC (ICD-401.1) Assessment Unchanged No meds today secondary to procedure, continue current regimine EKG, labs done today EKG-NSR no LVF Her updated medication list for this problem includes:    Hydrochlorothiazide 25 Mg Tabs (Hydrochlorothiazide) .Marland Kitchen... 1 by mouth daily  Orders: CBC-[facility] ([address]) Comp Met-[facility] ([address]696) Lipid-[facility] (29528-41324) [facility]- Est Level  3 ([address])  Problem # 2:  OBESITY,  NOS (ICD-278.00) Assessment: Unchanged  Will plan to start Orilistat as pt has tried and continues with lifestyle changes but with difficulty boosting her weight loss. Discussed side effects of medication including flatus, oily stools, cramping , etc pt states as she does not work and stays at home would like to try it. Baseline cardiac labs done per above Will plan to start after surgical intervention, pt will continue with nutrition and exercise program, can not afford bariatric surgery  Orders: [facility]- Est Level  3 ([address]36)  Problem # 3:  HYPERLIPIDEMIA (ICD-272.4) Assessment: Unchanged Check lipids Orders: Lipid-[facility] (0011001100) [facility]- Est Level  3 ([address])  Complete Medication List: 1)  Hydrochlorothiazide 25 Mg Tabs (Hydrochlorothiazide) .Marland Kitchen.. 1 by mouth daily  Patient Instructions: 1)  Give me a call about the surgery 2)  We will start the weight loss medication after the surgery 3)  Continue your blood pressure pill daily 4)  Follow up after the surgery  5)  I will call you with your blood work  Appended Document: Orders Update    Clinical Lists Changes  Orders: Added new Test order of EKG- [facility] (EKG) - Signed

## 2010-12-04 NOTE — Progress Notes (Signed)
  Phone Note Outgoing Call   Call placed by: Milinda Antis MD,  February 16, 2010 12:19 PM Details for Reason: Discuss lab results Summary of Call: Discussed lab results with patient, will hold on statin at this time. PT diagnosed with cyst in abdomen by surgery but at this time does not want intervention, therefore we will start the weight loss program. Will send Orlistat over. Pt to f/u 1 month or call sooner if having any trouble .    New/Updated Medications: XENICAL 120 MG CAPS (ORLISTAT) 1 by mouth three times a day with meals Prescriptions: XENICAL 120 MG CAPS (ORLISTAT) 1 by mouth three times a day with meals  #90 x 6   Entered and Authorized by:   Milinda Antis MD   Signed by:   Milinda Antis MD on 02/16/2010   Method used:   Electronically to        [facility]. [phone]* (retail)       [address]       [place], [place]  [address]       Ph: [phone]       Fax: [phone]   RxID:   [id number]

## 2010-12-04 NOTE — Letter (Signed)
Summary: Normal Pap Smear  All     ,     Phone:   Fax:     July 24, 2010     [facility] [address] [place], [place] [address]    Dear Ms. [facility],  The results of your recent pap smear were: Normal, no evidence of abnormal or malignant cells.   Our recommendations are as follows: Repeat PAP smear in 1 year.  Your other blood work was normal, no other evidence of infections.We attempted to call you with these results.    Please contact our office if you have any questions.  Sincerely,   Milinda Antis MD  Appended Document: Normal Pap Smear mailed.

## 2010-12-04 NOTE — Progress Notes (Signed)
Summary: Rx Prob  Phone Note Call from Patient Call back at [phone]   Caller: Patient Summary of Call: The medication that was sent in for her Medicaid does not pay for.  Is there something else along the same lines that medicaid will pay for? Initial call taken by: Clydell Hakim,  February 16, 2010 4:50 PM  Follow-up for Phone Call        will forward to MD. Follow-up by: Theresia Lo RN,  February 16, 2010 5:06 PM  Additional Follow-up for Phone Call Additional follow up Details #1::        There is no other medication , besides the over the ecounter Alli system Additional Follow-up by: Milinda Antis MD,  February 17, 2010 11:02 AM     Appended Document: Rx Prob patient notified.Marland Kitchen

## 2010-12-04 NOTE — Progress Notes (Signed)
Summary: FYI-No Showed x 2 with [facility]  Phone Note From Other Clinic   Caller: [facility] Summary of Call: Calling to let us know that [place] has no showed two appointments with them. Initial call taken by: Terese Door,  September 20, 2010 4:24 PM

## 2010-12-04 NOTE — Progress Notes (Signed)
Summary: Schedule [facility]  Phone Note Call from Patient   Caller: Patient Call For: Spero Geralds, Psy.D. Summary of Call: Patient called to schedule an appt.  We decided on 07/25/10 at 11:00. Initial call taken by: Spero Geralds PsyD,  July 10, 2010 5:30 PM

## 2010-12-04 NOTE — Assessment & Plan Note (Signed)
Summary: F/U per Gerilyn Pilgrim / JCS   Vital Signs:  Patient profile:   45 year old female Height:      64 inches Weight:      298.6 pounds BMI:     51.44  Vitals Entered By: Wyona Almas PHD (November 09, 2009 1:57 PM)  Primary Care Provider:  Levander Campion MD   History of Present Illness: Assessment:  Spent 60 min with pt.  Carla Hanson has fallen off of her exercise routine during the holidays.  She plans to get back to walking regularly, but dysthmia often seems to contribute to skipping her exercise.  24-hr recall suggests kcal intake of 1500-1600: B (8 AM)- 2 scr eggs, 1 apple, 8 oz blueberry-pomegranate juice; L (12:30 PM)- 2 oz fried chx, 1 c corn, 1 1/2 c green beans, total of  ~1 t margarine; 10 oz Pepsi; Snk of 24 oz water; D (6:30 PM)- 2 slc homemade broc & cheese pizza made w/ bought thin crust, 1 c coffee w/ hazelnut creamer, 8 oz blueberry-pomegranate juice; Snk (PM)- water.    Nutrition Diagnosis:  Physical inactivity (NB-2.1) related to poor motivation as evidenced by no activity in last two weeks.  Inappropriate intake of types of carbohydrate (NI-53.3) related to beverages especially as evidenced by daily consumption of juice and Pepsi.    Intervention: See patient instructions.    Monitoring/Eval: Dietary intake, body weight, and exercise at 3-wk F/U.    Allergies: No Known Drug Allergies   Complete Medication List: 1)  Celexa 10 Mg Tabs (Citalopram hydrobromide) .Marland Kitchen.. 1 by mouth daily 2)  Norvasc 10 Mg Tabs (Amlodipine besylate) .Marland Kitchen.. 1 by mouth daily for high blood pressure 3)  Chantix Starting Month Pak 0.5 Mg X 11 & 1 Mg X 42 Tabs (Varenicline tartrate) .... Take as directed 4)  Chantix Continuing Month Pak 1 Mg Tabs (Varenicline tartrate) .... Take as directed  [facility]  Patient Instructions: 1)  The 5-minute rule of exercise:  Promise yourself that you'll walk at least 5 minutes.   2)  REASONS TO EXERCISE:  Maintaining  muscle mass (which means better balance, lower risk of falls and fractures, greater independence), and better psychological and cognitive function.   3)  Exercise goal: At least 20 minutes at least 5 X wk (w/ mom when possible).   4)  Your lipids in Sept were as follows:  196 Total, 125 LDL, 40 HDL, 155 Triglycerides.  Ideally, your TG would be under 150, and your HDL a bit higher.  Exercise, weight loss, and limiting your refined carbohydrates will help normalize these values.   5)  Dietary goals:  Limit beverages to water, fat-free or 1% milk, coffee, unsweetened tea, flavored (diet) water, and herb tea.   6)  Remember that sweet taste preference will be promoted by continuing to consume sweet beverages, so do your best to decrease the sweetness of your drinks.   7)  Food cravings:  Delay, Distract, and Distance yourself! 8)  Write out a list of "distractions," read, clean, sewing or other project, organize closet.   9)  Daily food record in WRITING.  Remember to review this regularly.  Bring this record to your next appt. on Jan 25 at 11:30.

## 2010-12-04 NOTE — Progress Notes (Signed)
Summary: triage  Phone Note Call from Patient Call back at [phone]   Caller: Patient Summary of Call: Wants to start the [facility] and wants to know if this is o.k. with the medications she currently takes? Initial call taken by: Clydell Hakim,  January 22, 2010 1:52 PM  Follow-up for Phone Call        told her md will have to approve. will call her when I get a response. told her how it worked & possible fecal incontinence at times. Follow-up by: Golden Circle RN,  January 22, 2010 1:56 PM  Additional Follow-up for Phone Call Additional follow up Details #1::        Alli or Orilistat does not interact with HCTZ, but it has not been found to cause any efficacious weight loss. Healthy diet and exercise is best. I do not reccomend any diet pills  Please remind her this medication causes fecal urgency/incontinence in many people Additional Follow-up by: Milinda Antis MD,  January 22, 2010 4:16 PM    Additional Follow-up for Phone Call Additional follow up Details #2::    told her what md said. urged her to avoid fats such as butter & oil as a start to losing weight as they have more than double the calories of any other foods. Follow-up by: Golden Circle RN,  January 22, 2010 4:39 PM

## 2010-12-04 NOTE — Assessment & Plan Note (Signed)
Summary: F/U per Gerilyn Pilgrim / JCS   Vital Signs:  Patient profile:   45 year old female Height:      64 inches Weight:      299.3 pounds BMI:     51.56  Vitals Entered By: Wyona Almas PHD (December 18, 2009 2:30 PM)  Primary Care Provider:  Levander Campion MD   History of Present Illness: Assessment:  Spent 30 minutes with pt.  [place] has been walking 30 min about 4 Xwk, and she has cut back on her sugar intake.  Still struggling to decrease flavored cream in her coffee.  24-hr recall suggests kcal intake of  ~1500 plus creamer: B (10 AM)- 1 pancake w/ 1 tsp lite syrup, 2 strips [place] bacon, 1 egg in olive oil, 8 oz juice, 20-oz coffee with flavored creamer; L (3 PM)- 3 hush puppies [facility] leftovers), 4 fried scallops, 8 fried shrimp, 12 oz juice, 1 banana, 6 oz vanilla l-f Greek yogurt; Snk (6:30 PM)- 21 Ritz toasted chips,  20-oz coffee with flavored creamer, water; D (PM)- None.  Yesterday was atypical from usual in that she didn't have veg's and she had very little water or milk.  She said her eating has been "off" recently as she struggles with issues in her life.  Restaurant food is only  ~2 X wk usually.  Shrita does not measure the creamer she puts in her 20-oz coffees, and is not sure of the kcal/fat/sugar content.  We discussed the need for awareness in both diet (eg, measuring potential big kcal sources) and exercise (eg, some measure of energy expenditure/exercise such as w/ a pedometer).    Nutrition Diagnosis:  Some progress on physical inactivity (NB-2.1) related to poor motivation as evidenced by 30-min walks 4 X wk.  Some progress on inappropriate intake of types of carbohydrate (NI-53.3) related to beverages especially as evidenced by self-reported decrease in SSB (w/ exception of flavored coffee creamer).   Intervention: See Patient Instructions.    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      Allergies: No Known Drug Allergies   Complete Medication List: 1)  Hydrochlorothiazide 25 Mg Tabs (Hydrochlorothiazide) .Marland Kitchen.. 1 by mouth daily 2)  Ativan 1 Mg Tabs (Lorazepam) .Marland Kitchen.. 1 by mouth prior to procedure  Other Orders: Reassessment Each 15 min unit- [facility] ([address])  Patient Instructions: 1)  Call Dr. Gerilyn Pilgrim with the nutrition facts from your cream label: serving size, # g total fat, saturated fat, trans fat, # g total sugar, total calories.   2)  DIETARY GOALS:  (a) Increase fruit (at least 3 serving/day); (b) increase water (at least 48 oz); (c) decrease coffee to medium cup; (d) MEASURE your cream!; (e) increase veg's to 2 X day.   3)  Continue walking at least 4 X wk, and look for exercise opportunities.   4)  Get a pedometer:  Omron or Digiwalker are good brands.   5)  Follow-up:  Call for Nutrition appt following your surgery consult appt:  [phone].

## 2010-12-04 NOTE — Progress Notes (Signed)
Summary: waiting for pt to call back/see note please/ts  Phone Note Call from Patient Call back at Home Phone [phone]   Caller: Patient Summary of Call: Pt needs a referral to Dr. Saralyn Pilar eye doctor. Initial call taken by: Clydell Hakim,  August 24, 2010 9:29 AM  Follow-up for Phone Call        called pt. 'long song playing'. waiting for pt to call back. PLEASE ASK:  did pt see dr.sigmon before? why does pt need referral? problems? thank you Follow-up by: Arlyss Repress CMA,,  August 24, 2010 9:43 AM     Appended Document: waiting for pt to call back/see note please/ts needs an OK since she is medicaid and he is a new doctor - seeing flashes in her eye [phone] is Dr Saralyn Pilar

## 2010-12-04 NOTE — Assessment & Plan Note (Signed)
Summary: CPE WITH PAP/KH   Vital Signs:  Patient profile:   45 year old female Height:      64 inches Weight:      295 pounds BMI:     50.82 Temp:     98.5 degrees F oral Pulse rate:   88 / minute BP sitting:   143 / 83  (left arm)  Vitals Entered By: Tessie Fass CMA (July 20, 2010 2:15 PM) CC: complete physical with pap Is Patient Diabetic? No Pain Assessment Patient in pain? no        Primary Care Provider:  Milinda Antis MD  CC:  complete physical with pap.  History of Present Illness:   Pt here for CPE with PAP, asking for STD check, as she recently found out her husband whom she is sepearted from has another female pregant. She has some discharge , clear, with mild irritation in vaginal area, no abdominal pain.  LMP- 3rd week of August  Mood- Per previous notes, pt mood had improved initially with help of mother and friends, until recently when she discovered her husband impregnanted a neightbor across the street whom she sees daily. Since then she has been on edge, high anxiety, crying spells especially when left alone, no SI. She has been staying at home mostly, hides her stress and feelings from her daughter who she is trying to be strong for. She feels she has done something in the past to cause her husband to leave her and now he is with another woman. Regarding the computer classes she was excited about, this is now on hold because of time and money. She is now unable to sleep very much at all,stays up thinking about situation. now thinks she needs meds to help her  Habits & Providers  Alcohol-Tobacco-Diet     Tobacco Status: current     Tobacco Counseling: to quit use of tobacco products     Cigarette Packs/Day: 0.25  Current Medications (verified): 1)  Hydrochlorothiazide 25 Mg Tabs (Hydrochlorothiazide) .Marland Kitchen.. 1 By Mouth Daily 2)  Ambien 5 Mg Tabs (Zolpidem Tartrate) .Marland Kitchen.. 1 By Mouth At Bedtime As Needed Insomnia 3)  Celexa 10 Mg Tabs (Citalopram  Hydrobromide) .Marland Kitchen.. 1 By Mouth Daily For Mood 4)  Flagyl 500 Mg Tabs (Metronidazole) .Marland Kitchen.. 1 By Mouth Two Times A Day X 7 Days  Allergies (verified): No Known Drug Allergies  Social History: Smoking Status:  current Packs/Day:  0.25  Physical Exam  General:  Obese, NAD , AXOX3 Crying during interviewing Breasts:  No mass, nodules, thickening, tenderness, bulging, retraction, inflamation, nipple discharge or skin changes noted.   Abdomen:  soft, non-tender, normal bowel sounds, and no distention.  Obese Genitalia:  Thin white non odorous dischare.normal introitus, no external lesions, mucosa pink and moist, no vaginal or cervical lesions, and no vaginal atrophy. 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 1 by mouth two times a day x 7 days  Orders: [facility] - Est  40-64 yrs ([address])  Problem # 2:  SCREENING FOR MALIGNANT NEOPLASM OF THE CERVIX (ICD-V76.2) Assessment: New  Orders: Pap Smear-[facility] ([id number]) [facility] - Est  40-64 yrs ([address])  Problem # 3:  DEPRESSION, MAJOR, RECURRENT (ICD-296.30) Assessment:  Deteriorated  Reviewed meds history, will restart Celexa 10mg , plan 6 months treatment, pt to follow-up with in house couseling and myself, no SI. Very difficult situation, I beleive she has good insight and had started a good plan to recovery and coping, until this new news of a child while she and her husband are still legally married and he does not care for their teenage daughter.RTC 2 weeks  Orders: [facility] - Est  40-64 yrs ([address])  Complete Medication List: 1)  Hydrochlorothiazide 25 Mg Tabs (Hydrochlorothiazide) .Marland Kitchen.. 1 by mouth daily 2)  Ambien 5 Mg Tabs (Zolpidem  tartrate) .Marland Kitchen.. 1 by mouth at bedtime as needed insomnia 3)  Celexa 10 Mg Tabs (Citalopram hydrobromide) .Marland Kitchen.. 1 by mouth daily for mood 4)  Flagyl 500 Mg Tabs (Metronidazole) .Marland Kitchen.. 1 by mouth two times a day x 7 days  Other Orders: Wet Prep- [facility] [phone]) GC/Chlamydia-[facility] ([id number]) HIV-[facility] ([id number])  Patient Instructions: 1)  Start the Celexa 2)  Return in 2 weeks to follow-up on the meds  3)  I will call you with your labs monday  Prescriptions: FLAGYL 500 MG TABS (METRONIDAZOLE) 1 by mouth two times a day X 7 DAYS  #14 x 0   Entered and Authorized by:   Milinda Antis MD   Signed by:   Milinda Antis MD on 07/20/2010   Method used:   Electronically to        [facility] [phone]* (retail)       [address]       [place], [place]  [address]       Ph: [phone]       Fax: [phone]   RxID:   [id number] CELEXA 10 MG TABS (CITALOPRAM HYDROBROMIDE) 1 by mouth daily for mood  #30 x 3   Entered and Authorized by:   Milinda Antis MD   Signed by:   Milinda Antis MD on 07/20/2010   Method used:   Electronically to        [facility]. [phone]* (retail)       [address]       [place], [place]  [address]       Ph: [phone]       Fax: [phone]   RxID:   [id number]   Laboratory Results  Date/Time Received: July 20, 2010 3:08 PM  Date/Time Reported: July 20, 2010 3:19 PM   Wet Roland Source: vag WBC/hpf: 3-8 Bacteria/hpf: 3+  Cocci Clue cells/hpf: moderate  Positive whiff Yeast/hpf: none Trichomonas/hpf: none Comments: ...............test performed by......Marland KitchenBonnie A. [place], MLS (ASCP)cm

## 2010-12-05 ENCOUNTER — Encounter: Payer: Self-pay | Admitting: *Deleted

## 2010-12-06 NOTE — Assessment & Plan Note (Signed)
Summary: f/u visit/bmc ( will take care of copay on next visit)   Vital Signs:  Patient profile:   45 year old female Height:      64 inches Weight:      291 pounds BMI:     50.13 Pulse rate:   69 / minute BP sitting:   159 / 87  (right arm) Cuff size:   large  Vitals Entered By: Arlyss Repress CMA, (November 13, 2010 2:32 PM) CC: f/up Celexa. pt is on 10mg  right now and it helps some.,nail problems Is Patient Diabetic? No Pain Assessment Patient in pain? no        Primary Care Provider:  Milinda Antis MD  CC:  f/up Celexa. pt is on 10mg  right now and it helps some. and nail problems.  History of Present Illness:   f/u Mood- Started a mentoring program , school 3 days a week helps with self appreciation, nutriontion and stress Started smoking  again , now 1 cig /day when she does smoke Feels meds are helping but needs a little more Celexa. Ambien still needed for sleep, sleep 5-6 hours a day, often wakes up with mind racing. Feels she is getting over her husband and understands she cant help the situation. She is happier now, doing more things with her daughter and getting out of the house.  Her biggest concern at this time is her weight and she wants to be able to "chop" off the weight. Crying episodes have improved pt states previoulsy had 9 bad days to 2 good days, now 5 bad days to 4 good days.      Pt removed most of nail on right thumb, thought it had a fungus, it was dark in color, does not like apperance  Habits & Providers  Alcohol-Tobacco-Diet     Tobacco Status: current     Tobacco Counseling: to quit use of tobacco products  Comments: only smokes one cigarette a day and is in the process of quitting  Current Medications (verified): 1)  Lisinopril-Hydrochlorothiazide 20-25 Mg Tabs (Lisinopril-Hydrochlorothiazide) .Marland Kitchen.. 1 By Mouth Daily For Blood Pressure 2)  Ambien 5 Mg Tabs (Zolpidem Tartrate) .Marland Kitchen.. 1 By Mouth At Bedtime As Needed Insomnia 3)  Celexa 20 Mg  Tabs (Citalopram Hydrobromide) .Marland Kitchen.. 1 By Mouth Daily For Mood  Allergies (verified): No Known Drug Allergies  Past History:  Past Medical History: Last updated: 07/03/2010 HTN SMoking--quit obesity depresion  Review of Systems       +depression  Physical Exam  General:  Obese, NAD , AXOX3 Vital signs noted  Skin:  Right thumb- nail partially removed, no subungal hematoma, but darkend appearance to nail, no yellow discoloration or friability of nail no pit in nail noted  other nails normal Psych:  Oriented X3, memory intact for recent and remote, normally interactive, good eye contact, not anxious appearing, and not depressed appearing.  smiling today, no crying during visit, no SI, good affect   Impression & Recommendations:  Problem # 1:  DEPRESSION, MAJOR, RECURRENT (ICD-296.30) Assessment Improved  Increase Celexa to 20mg , encouraged activites and pt to continue in self esteem classes Recheck 2 weeks Contracted for saftey  Orders: [facility] - Est  40-64 yrs ([address])  Orders: [facility]- Est  Level 4 ([address])  Problem # 2:  HYPERTENSION, BENIGN SYSTEMIC (ICD-401.1) Assessment: Deteriorated Add ACE, check BMET today, recheck 2 weeks Her updated medication list for this problem includes:    Lisinopril-hydrochlorothiazide 20-25 Mg Tabs (Lisinopril-hydrochlorothiazide) .Marland Kitchen... 1 by mouth daily for  blood pressure  Orders: Basic Met-[facility] ([id number]) [facility]- Est  Level 4 ([address])  Problem # 3:  TOBACCO ABUSE (ICD-305.1) Assessment: Deteriorated  Pt does not want any meds, given enouragement on quitting and cutting back  Orders: [facility]- Est  Level 4 ([address])  Problem # 4:  OBESITY, NOS (ICD-278.00) Assessment: Improved  Weight down 4lbs, undoubtlfully playing to depression. +nutritionist, discussed cardio for weight loss, pt to start Zumba   Orders: [facility]- Est  Level 4 ([address])  Complete Medication List: 1)  Lisinopril-hydrochlorothiazide 20-25 Mg Tabs  (Lisinopril-hydrochlorothiazide) .Marland Kitchen.. 1 by mouth daily for blood pressure 2)  Ambien 5 Mg Tabs (Zolpidem tartrate) .Marland Kitchen.. 1 by mouth at bedtime as needed insomnia 3)  Celexa 20 Mg Tabs (Citalopram hydrobromide) .Marland Kitchen.. 1 by mouth daily for mood  Other Orders: Influenza Vaccine NON MCR ([address])  Patient Instructions: 1)  Try the Zumba Class 2)  Increase your Celexa 20mg  daily, you can take 2 10mg  tablets as needed  3)  Start the new blood pressure pill 4)  Next visit in 2 weeks, recheck kidney, and recheck Celexa  Prescriptions: LISINOPRIL-HYDROCHLOROTHIAZIDE 20-25 MG TABS (LISINOPRIL-HYDROCHLOROTHIAZIDE) 1 by mouth daily for blood pressure  #30 x 3   Entered and Authorized by:   Milinda Antis MD   Signed by:   Milinda Antis MD on 11/13/2010   Method used:   Electronically to        [facility] [address] [phone]* (retail)       [address] [place], [place]  [address]       Ph: [phone]       Fax: [phone]   RxID:   [id number] CELEXA 20 MG TABS (CITALOPRAM HYDROBROMIDE) 1 by mouth daily for mood  #30 x 6   Entered and Authorized by:   Milinda Antis MD   Signed by:   Milinda Antis MD on 11/13/2010   Method used:   Electronically to        [facility] [address] [phone]* (retail)       [address] [place], [place]  [address]       Ph: [phone]       Fax: [phone]   RxID:   [id number]    Orders Added: 1)  Basic Met-[facility] [phone] 2)  Influenza Vaccine NON MCR [00028] 3)  [facility]- Est  Level 4 [[address]]   Immunizations Administered:  Influenza Vaccine # 1:    Vaccine Type: Fluvax Non-MCR    Site: left deltoid    Mfr: GlaxoSmithKline    Dose: 0.5 ml    Route: IM    Given by: Arlyss Repress CMA,    Exp. Date: 05/04/2011    Lot #: [id number]    VIS given: 05/29/10 version given November 13, 2010.  Flu Vaccine Consent Questions:    Do you have a history of severe allergic reactions to this vaccine? no    Any prior history of  allergic reactions to egg and/or gelatin? no    Do you have a sensitivity to the preservative Thimersol? no    Do you have a past history of Guillan-Barre Syndrome? no    Do you currently have an acute febrile illness? no    Have you ever had a severe reaction to latex? no    Vaccine information given and explained to patient? yes    Are you currently pregnant? no   Immunizations Administered:  Influenza Vaccine # 1:  Vaccine Type: Fluvax Non-MCR    Site: left deltoid    Mfr: GlaxoSmithKline    Dose: 0.5 ml    Route: IM    Given by: Arlyss Repress CMA,    Exp. Date: 05/04/2011    Lot #: [id number]    VIS given: 05/29/10 version given November 13, 2010.   Prevention & Chronic Care Immunizations   Influenza vaccine: Fluvax Non-MCR  (11/13/2010)   Influenza vaccine due: 09/01/2008    Tetanus booster: 08/04/2005: Done.   Tetanus booster due: 08/05/2015    Pneumococcal vaccine: Not documented  Other Screening   Pap smear: NEGATIVE FOR INTRAEPITHELIAL LESIONS OR MALIGNANCY.  (07/20/2010)   Pap smear due: 07/2009    Mammogram: normal  (12/15/2007)   Mammogram due: 12/14/2008   Smoking status: current  (11/13/2010)  Lipids   Total Cholesterol: 202  (02/14/2010)   LDL: 129  (02/14/2010)   LDL Direct: Not documented   HDL: 37  (02/14/2010)   Triglycerides: 180  (02/14/2010)    SGOT (AST): 11  (02/14/2010)   SGPT (ALT): 10  (02/14/2010)   Alkaline phosphatase: 45  (02/14/2010)   Total bilirubin: 0.3  (02/14/2010)  Hypertension   Last Blood Pressure: 159 / 87  (11/13/2010)   Serum creatinine: 0.67  (02/14/2010)   Serum potassium 4.3  (02/14/2010)    Hypertension flowsheet reviewed?: Yes   Progress toward BP goal: Deteriorated  Self-Management Support :   Personal Goals (by the next clinic visit) :      Personal blood pressure goal: 140/90  (12/14/2009)     Personal LDL goal: 130  (04/10/2010)    Hypertension self-management support: Not documented    Lipid  self-management support: Not documented

## 2010-12-08 ENCOUNTER — Encounter: Payer: Self-pay | Admitting: Family Medicine

## 2010-12-17 ENCOUNTER — Other Ambulatory Visit: Payer: Self-pay | Admitting: Family Medicine

## 2010-12-17 NOTE — Telephone Encounter (Signed)
Please review and refill

## 2010-12-18 ENCOUNTER — Ambulatory Visit: Payer: Self-pay | Admitting: Family Medicine

## 2011-02-06 ENCOUNTER — Ambulatory Visit: Payer: Self-pay | Admitting: Family Medicine

## 2011-02-27 ENCOUNTER — Ambulatory Visit (INDEPENDENT_AMBULATORY_CARE_PROVIDER_SITE_OTHER): Payer: Medicaid Other | Admitting: Family Medicine

## 2011-02-27 ENCOUNTER — Encounter: Payer: Self-pay | Admitting: Family Medicine

## 2011-02-27 VITALS — BP 144/80 | HR 80 | Temp 97.9°F | Ht 65.0 in | Wt 285.0 lb

## 2011-02-27 DIAGNOSIS — M79605 Pain in left leg: Secondary | ICD-10-CM

## 2011-02-27 DIAGNOSIS — M79609 Pain in unspecified limb: Secondary | ICD-10-CM

## 2011-02-27 MED ORDER — TRAMADOL HCL 50 MG PO TABS: 50.0000 mg | ORAL_TABLET | Freq: Four times a day (QID) | ORAL | Status: AC | PRN

## 2011-02-27 NOTE — Progress Notes (Signed)
  Subjective:    Patient ID: Carla Hanson, female    DOB: Dec 30, 1965, 45 y.o.   MRN: [id number]  HPI  Left leg pain  Present 1 week.   Description: Feels like sharp pins sticking into her thigh. Location: Pain starts from left hip and travels to lateral aspect of thigh and then travels medially across the thigh to end at pubic area.   She has been taking Aleve without much relief. Injury: no, but pt does did some dance moves last week.  No numbness, no weakness, no fever, no chills, no ataxia, no tingling Saddle anesthesia: no Urinary or bowel incontinence: no Worse with standing. Better with sitting or lying down. Back pain: yes, but this is chronic, and not worse than normal.  Chronic steroid use:no   Review of Systems Per hpi     Objective:   Physical Exam  Constitutional: She appears well-developed and well-nourished. No distress.  Musculoskeletal:       Left hip: She exhibits decreased range of motion, tenderness and bony tenderness. She exhibits normal strength, no swelling, no crepitus, no deformity and no laceration.       Gait: antalgic, favoring L leg +tenderness to deep palpation around area of greater trochanter.  Exam was difficult due to body habitus.            Assessment & Plan:

## 2011-02-27 NOTE — Assessment & Plan Note (Addendum)
Etiology unclear, can be spasm vs trochanter bursitis vs meralgia paresthetica.  Exam difficult due to body habitus.  Does not seem to have radicular symptoms.  Will treat with ultram for now.  We discussed injection if pain not resolved, but may be difficult and may need to be referred to [facility].  Pt would like to avoid injection if possible.

## 2011-03-05 HISTORY — PX: LIPOMA EXCISION: SHX5283

## 2011-03-06 ENCOUNTER — Ambulatory Visit: Payer: Medicaid Other | Admitting: Family Medicine

## 2011-03-06 ENCOUNTER — Encounter: Payer: Medicaid Other | Admitting: Family Medicine

## 2011-03-06 ENCOUNTER — Encounter: Payer: Self-pay | Admitting: Family Medicine

## 2011-03-07 ENCOUNTER — Other Ambulatory Visit: Payer: Self-pay | Admitting: General Surgery

## 2011-03-07 ENCOUNTER — Other Ambulatory Visit (HOSPITAL_COMMUNITY): Payer: Self-pay | Admitting: General Surgery

## 2011-03-07 ENCOUNTER — Encounter (HOSPITAL_COMMUNITY): Payer: Medicaid Other

## 2011-03-07 ENCOUNTER — Ambulatory Visit (HOSPITAL_COMMUNITY)
Admission: RE | Admit: 2011-03-07 | Discharge: 2011-03-07 | Disposition: A | Discharge status: Home / Self Care | Payer: Medicaid Other | Source: Ambulatory Visit | Attending: General Surgery | Admitting: General Surgery

## 2011-03-07 DIAGNOSIS — Z01812 Encounter for preprocedural laboratory examination: Secondary | ICD-10-CM | POA: Insufficient documentation

## 2011-03-07 DIAGNOSIS — Z01818 Encounter for other preprocedural examination: Secondary | ICD-10-CM

## 2011-03-07 DIAGNOSIS — I10 Essential (primary) hypertension: Secondary | ICD-10-CM | POA: Insufficient documentation

## 2011-03-07 DIAGNOSIS — F17200 Nicotine dependence, unspecified, uncomplicated: Secondary | ICD-10-CM | POA: Insufficient documentation

## 2011-03-07 DIAGNOSIS — R1900 Intra-abdominal and pelvic swelling, mass and lump, unspecified site: Secondary | ICD-10-CM | POA: Insufficient documentation

## 2011-03-07 DIAGNOSIS — Z01810 Encounter for preprocedural cardiovascular examination: Secondary | ICD-10-CM | POA: Insufficient documentation

## 2011-03-07 LAB — BASIC METABOLIC PANEL
Chloride: 97 mEq/L (ref 96–112)
GFR calc Af Amer: >60 mL/min (ref >60)
GFR calc non Af Amer: >60 mL/min (ref >60)
Potassium: 3.6 mEq/L (ref 3.5–5.1)
Sodium: 136 mEq/L (ref 135–145)

## 2011-03-07 LAB — CBC
HCT: 39.7 % (ref 36.0–46.0)
MCH: 25.6 pg — ABNORMAL LOW (ref 26.0–34.0)
MCV: 78.9 fL (ref 78.0–100.0)
RBC: 5.03 MIL/uL (ref 3.87–5.11)
WBC: 10.8 10*3/uL — ABNORMAL HIGH (ref 4.0–10.5)

## 2011-03-07 LAB — URINALYSIS, ROUTINE W REFLEX MICROSCOPIC
Bilirubin Urine: NEGATIVE
Ketones, ur: NEGATIVE mg/dL
Specific Gravity, Urine: 1.014 (ref 1.005–1.030)
pH: 6 (ref 5.0–8.0)

## 2011-03-07 LAB — URINE MICROSCOPIC-ADD ON

## 2011-03-07 LAB — HCG, SERUM, QUALITATIVE: Preg, Serum: NEGATIVE

## 2011-03-07 LAB — SURGICAL PCR SCREEN
MRSA, PCR: NEGATIVE
Staphylococcus aureus: NEGATIVE

## 2011-03-07 NOTE — Progress Notes (Signed)
This encounter was created in error - please disregard.

## 2011-03-12 ENCOUNTER — Ambulatory Visit (INDEPENDENT_AMBULATORY_CARE_PROVIDER_SITE_OTHER): Payer: Medicaid Other | Admitting: Family Medicine

## 2011-03-12 ENCOUNTER — Encounter: Payer: Self-pay | Admitting: Family Medicine

## 2011-03-12 VITALS — BP 138/79 | HR 68 | Temp 98.2°F | Ht 64.0 in | Wt 280.0 lb

## 2011-03-12 DIAGNOSIS — M79609 Pain in unspecified limb: Secondary | ICD-10-CM

## 2011-03-12 DIAGNOSIS — M79605 Pain in left leg: Secondary | ICD-10-CM

## 2011-03-12 DIAGNOSIS — F339 Major depressive disorder, recurrent, unspecified: Secondary | ICD-10-CM

## 2011-03-12 DIAGNOSIS — F17200 Nicotine dependence, unspecified, uncomplicated: Secondary | ICD-10-CM

## 2011-03-12 DIAGNOSIS — I10 Essential (primary) hypertension: Secondary | ICD-10-CM

## 2011-03-12 NOTE — Patient Instructions (Signed)
Do not take the Lisinopril/hCTZ - the day of surgery take it the next day Do not take ultram before the surgery  You can take the Celexa Congratulations on the weight loss Make an appt in 1 month

## 2011-03-12 NOTE — Progress Notes (Signed)
  Subjective:    Patient ID: Carla Hanson, female    DOB: 07/03/1966, 45 y.o.   MRN: [id number]  HPI f/u leg pain and HTN, pt scheduled for surgery to have abd wall lipoma removed on Friday Left leg pain- now improved, concerned for bursitis on initial exam- pain was located over left lateral thgh , takes ultram rarely at this point, able to ambulate without pain, unsure of any particular injury remembers dancing during the weekend before  Concerned about her varicose veins- showed me a old pic where one ruptured and left a large bruise, she has tried a few vein specialist and they do not take Medicaid  Mood- doing well on Celexa,, taking 20mg  daily, feels excited about the future, now in a new relationship which she thinks is promising,feels ready to face the divorce proceedings, feels good about herself, has been losing weight, continues to f/u with the counselor and self help group she joined  HTN- tolerating meds, no HA, no side effects   Review of Systems per above     Objective:   Physical Exam   GEN- NAD, alert and oriented, obese   MSK- normal ROM at hip, no pain with IR/ER, non tender over left trochanteric region, neg hip rock, neg SLR, motor equal bilat lower ext, sensation in tact   Normal gait  Mood- very excited today, smiling, happy mood, normal affect  Skin- multiple varicose veins on lower ext- non tender, no erythema surrounding, no ecchymosis       Assessment & Plan:   Varicose veins- I called dermatology - Medicaid will not cover any cosmetic procedure, varicose veins are not covered by most specialties, will let pt know she could try a payment plan

## 2011-03-13 ENCOUNTER — Encounter: Payer: Self-pay | Admitting: Family Medicine

## 2011-03-13 ENCOUNTER — Telehealth: Payer: Self-pay | Admitting: *Deleted

## 2011-03-13 NOTE — Assessment & Plan Note (Signed)
Mood much improved today, pt very optimistic about the future with her new relationship and her self help group- this is a new attitude for pt and I commended her on this big step Continue celexa, no evidence of SI

## 2011-03-13 NOTE — Assessment & Plan Note (Addendum)
DIscussed cessation, pt to think about Chantix, which helped her in the past Discussed risk of tobacco with anesthesia for upcoming surgery

## 2011-03-13 NOTE — Assessment & Plan Note (Signed)
Reviewed pre-op labs ,creatinine stable on ACE Continue, pt will hold per instructions for surgery

## 2011-03-13 NOTE — Assessment & Plan Note (Signed)
Improved, will monitor for now Other differential- MSK pain from new activity, no red flags

## 2011-03-13 NOTE — Telephone Encounter (Signed)
Called and gave patient message below.Carla Hanson, Carla Hanson

## 2011-03-13 NOTE — Telephone Encounter (Signed)
Message copied by Tessie Fass on Wed Mar 13, 2011 11:22 AM ------      Message from: Milinda Antis      Created: Wed Mar 13, 2011 10:31 AM      Regarding: Derm Referral       Please let pt know, I have called dermatology and spoken with the billing people, Medicaid will not cover any varicose vein treatments, you will have to pay out of pocket wherever she goes, therefore I did not make a referral.      She can call around other vein specialist if she likes, but will likely run into the same problem, unless she can pay out of pocket.

## 2011-03-15 ENCOUNTER — Ambulatory Visit (HOSPITAL_COMMUNITY)
Admission: RE | Admit: 2011-03-15 | Discharge: 2011-03-15 | Disposition: A | Discharge status: Home / Self Care | Payer: Medicaid Other | Source: Ambulatory Visit | Attending: General Surgery | Admitting: General Surgery

## 2011-03-15 ENCOUNTER — Other Ambulatory Visit: Payer: Self-pay | Admitting: General Surgery

## 2011-03-15 DIAGNOSIS — F17200 Nicotine dependence, unspecified, uncomplicated: Secondary | ICD-10-CM | POA: Insufficient documentation

## 2011-03-15 DIAGNOSIS — I10 Essential (primary) hypertension: Secondary | ICD-10-CM | POA: Insufficient documentation

## 2011-03-15 DIAGNOSIS — R1906 Epigastric swelling, mass or lump: Secondary | ICD-10-CM | POA: Insufficient documentation

## 2011-03-25 ENCOUNTER — Other Ambulatory Visit: Payer: Self-pay | Admitting: Family Medicine

## 2011-03-25 NOTE — Telephone Encounter (Signed)
Refill request

## 2011-04-04 NOTE — Op Note (Signed)
  NAMECHEETARA, HOGE                ACCOUNT NO.:  [id number]  MEDICAL RECORD NO.:  [id number]           PATIENT TYPE:  O  LOCATION:  DAYL                         FACILITY:  [facility]  PHYSICIAN:  Almond Lint, MD       DATE OF BIRTH:  Aug 22, 1966  DATE OF PROCEDURE:  03/15/2011 DATE OF DISCHARGE:                              OPERATIVE REPORT   PREOPERATIVE DIAGNOSIS:  Abdominal wall mass.  POSTOPERATIVE DIAGNOSIS:  Abdominal wall mass.  PROCEDURE PERFORMED:  Excision of abdominal wall mass 6 x 5 x 4 cm in the subcutaneous region.  SURGEON:  Almond Lint, M.D.  ASSISTANT:  None.  ANESTHESIA:  General and local.  FINDINGS:  Lipoma.  SPECIMEN:  Mass to Pathology.  ESTIMATED BLOOD LOSS:  Minimal.  COMPLICATIONS:  None known.  PROCEDURE IN DETAIL:  Ms. Segar is a 45 year old female who had a bothersome abdominal wall mass that was growing.  She was identified in the holding area and taken to the operating room where she was placed supine on the operating room table.  General anesthesia was induced. Her abdomen was prepped and draped in the standard fashion.  A time-out was performed according to the surgical safety check list.  When all was correct we continued.  The mass was identified in the epigastrium and a midline incision was made over this area.  Subcutaneous tissues were divided with cautery.  The mass was identified and dissected digitally. This came right off of the other attachments easily.  The mass was then passed off of the table once the pedicle was taken off.  The skin was irrigated and then closed with 3-0 Vicryl deep dermal interrupted sutures and 4-0 Monocryl running subcuticular sutures.  The wound was then cleaned, dried and dressed with Dermabond.  The patient was awakened from anesthesia and taken to the PACU in stable condition. Needle, sponge, and instrument counts were correct.     Almond Lint, MD     FB/MEDQ  D:  03/15/2011  T:  03/15/2011   Job:  [id number]  cc:   Milinda Antis, MD  Electronically Signed by Almond Lint MD on 04/04/2011 10:19:04 AM

## 2011-04-12 ENCOUNTER — Ambulatory Visit: Payer: Medicaid Other | Admitting: Family Medicine

## 2011-05-07 ENCOUNTER — Encounter: Payer: Self-pay | Admitting: Family Medicine

## 2011-05-28 ENCOUNTER — Encounter: Payer: Medicaid Other | Admitting: Family Medicine

## 2011-06-19 ENCOUNTER — Encounter: Payer: Medicaid Other | Admitting: Family Medicine

## 2011-07-02 ENCOUNTER — Other Ambulatory Visit: Payer: Self-pay | Admitting: Family Medicine

## 2011-07-02 DIAGNOSIS — Z1231 Encounter for screening mammogram for malignant neoplasm of breast: Secondary | ICD-10-CM

## 2011-07-26 ENCOUNTER — Ambulatory Visit: Payer: Medicaid Other

## 2011-08-02 ENCOUNTER — Encounter: Payer: Self-pay | Admitting: Family Medicine

## 2011-08-02 ENCOUNTER — Ambulatory Visit (INDEPENDENT_AMBULATORY_CARE_PROVIDER_SITE_OTHER): Payer: Medicaid Other | Admitting: Family Medicine

## 2011-08-02 VITALS — BP 143/83 | HR 71 | Ht 64.0 in | Wt 283.0 lb

## 2011-08-02 DIAGNOSIS — I10 Essential (primary) hypertension: Secondary | ICD-10-CM

## 2011-08-02 DIAGNOSIS — Z0000 Encounter for general adult medical examination without abnormal findings: Secondary | ICD-10-CM

## 2011-08-02 DIAGNOSIS — Z124 Encounter for screening for malignant neoplasm of cervix: Secondary | ICD-10-CM

## 2011-08-02 DIAGNOSIS — E669 Obesity, unspecified: Secondary | ICD-10-CM

## 2011-08-02 DIAGNOSIS — F17200 Nicotine dependence, unspecified, uncomplicated: Secondary | ICD-10-CM

## 2011-08-02 DIAGNOSIS — E785 Hyperlipidemia, unspecified: Secondary | ICD-10-CM

## 2011-08-02 DIAGNOSIS — N63 Unspecified lump in unspecified breast: Secondary | ICD-10-CM

## 2011-08-02 DIAGNOSIS — F339 Major depressive disorder, recurrent, unspecified: Secondary | ICD-10-CM

## 2011-08-02 MED ORDER — VARENICLINE TARTRATE 0.5 MG PO TABS: 0.5000 mg | ORAL_TABLET | Freq: Two times a day (BID) | ORAL | Status: AC

## 2011-08-02 NOTE — Patient Instructions (Addendum)
For your weight: Walk in the evening at 6:30, at the park if the weather is nice, or walking video, walking at the mall, walking track at the [employer].   Walk .  Diet- make an appointment to meet again with [facility].  Talk about ways to get breakfast in.  You need to eat 3 meals a day.   Blood pressure: Elevated today.  Make an appointment with the nurse.  For a blood pressure recheck.  If it stay elevated we need to increase your blood pressure medicine.  Exercise can also help.  Decrease sodium intake.    Smoking: Consider calling 1-800-quit-now for extra support.  Take chantix as we discussed.  Initial: Days 1-3: 0.5 mg once daily Days 4-7: 0.5 mg twice daily Maintenance (? Day 8): 1 mg twice daily for 11 weeks Note: Start 1 week before target quit date. If you successfully quit smoking at the end of the 12 weeks, may continue for another 12 weeks to help maintain success. If not successful in first 12 weeks, then we will stop medication and reassess factors contributing to failure.    Return in 1 month for blood pressure, smoking, and weight loss recheck.

## 2011-08-06 ENCOUNTER — Ambulatory Visit: Payer: Medicaid Other

## 2011-08-06 ENCOUNTER — Encounter: Payer: Self-pay | Admitting: Family Medicine

## 2011-08-06 DIAGNOSIS — N63 Unspecified lump in unspecified breast: Secondary | ICD-10-CM | POA: Insufficient documentation

## 2011-08-06 NOTE — Assessment & Plan Note (Signed)
Pt wanting to explore both medical and surgical options for weight loss.  Medical option is monthly follow up alternating between nutritionist and medical doctor to monitor weight and help with goal setting/problem solving.  Surgical options are available- pt to call [facility] to find out when bariatric surgery information sessions are scheduled. Pt to return in 1 month for weight check.  Pt goals are to : eat breakfast, increase exercise (pt wasn't able to create measureable goal at time of visit)

## 2011-08-06 NOTE — Assessment & Plan Note (Signed)
Stable on celexa, at f/up appt will obtain phq-9

## 2011-08-06 NOTE — Assessment & Plan Note (Signed)
Most likely this is a lipoma, pt has mammography scheduled for next week.  Pt to go as scheduled for this appt for further eval.

## 2011-08-06 NOTE — Assessment & Plan Note (Signed)
Not on any cholesterol medications.  Pt to have lipid panel at f/up appointment.

## 2011-08-06 NOTE — Progress Notes (Signed)
  Subjective:    Patient ID: Carla Hanson, female    DOB: 05-28-1966, 45 y.o.   MRN: [id number]  HPI Here for annual exam:  Obesity: Walking 2 x per week, pt states that she does "cheat" a lot with her diet, skips breakfast daily,   Tobacco: 1/2 ppd, increased recently due to stress, states she is ready to stop, tired of spending money.  Requesting rx for chantix, states she has used in the past and it really helped her.    Pap smear- Normal 1 year ago, has never had abormal, can weight 1-2 more years prior to recheck.   Breast lump- Left mid sternal area, "knot" present in this area of medial left breast. Feels soft. Non tender.  Present x months, no getting larger.    BP- 163/83, states she is taking her lisinopril/hctz.     Review of Systems As per above    Objective:   Physical Exam  Constitutional: She is oriented to person, place, and time. She appears well-developed and well-nourished.  HENT:  Head: Normocephalic and atraumatic.  Pulmonary/Chest: Effort normal and breath sounds normal. No respiratory distress.       Quarter sized nodule- consistency soft like lipoma, located on medial left breast near midline of sternum.    Abdominal: Soft. She exhibits no distension.  Neurological: She is alert and oriented to person, place, and time.  Skin: No rash noted.          Assessment & Plan:

## 2011-08-06 NOTE — Assessment & Plan Note (Addendum)
On lisinopril /hctz 20/25 po daily, bp 143/83, pt to return in 1 month for follow up, will get cmet at that time.

## 2011-08-13 ENCOUNTER — Other Ambulatory Visit: Payer: Self-pay | Admitting: Family Medicine

## 2011-08-20 ENCOUNTER — Ambulatory Visit: Payer: Medicaid Other

## 2011-08-26 ENCOUNTER — Ambulatory Visit: Payer: Medicaid Other | Admitting: Family Medicine

## 2011-09-05 ENCOUNTER — Ambulatory Visit: Payer: Medicaid Other

## 2011-11-12 ENCOUNTER — Ambulatory Visit: Payer: Medicaid Other | Admitting: Family Medicine

## 2011-11-13 ENCOUNTER — Other Ambulatory Visit: Payer: Self-pay | Admitting: Family Medicine

## 2011-11-15 ENCOUNTER — Ambulatory Visit (INDEPENDENT_AMBULATORY_CARE_PROVIDER_SITE_OTHER): Payer: Medicaid Other | Admitting: Family Medicine

## 2011-11-15 ENCOUNTER — Telehealth: Payer: Self-pay | Admitting: Family Medicine

## 2011-11-15 DIAGNOSIS — F339 Major depressive disorder, recurrent, unspecified: Secondary | ICD-10-CM

## 2011-11-15 NOTE — Telephone Encounter (Signed)
Needs refills on Citalopram and Lisinopril.  [facility] on [address] sent the requests over and have told the patient they were denied.  Carla Hanson would like to know why they were denied because she is out and needs her medications.  She did not know that she missed her appt today and has rescheduled for next Wednesday and would like enough meds to last until her appt.

## 2011-11-15 NOTE — Telephone Encounter (Signed)
Will forward to Dr Caviness 

## 2011-11-18 ENCOUNTER — Telehealth: Payer: Self-pay | Admitting: Family Medicine

## 2011-11-18 NOTE — Telephone Encounter (Signed)
Carla Hanson need her lisinopril and citalopram called to pharmacy due to her appt being on Wed and she is completely out of med.

## 2011-11-19 ENCOUNTER — Other Ambulatory Visit: Payer: Self-pay | Admitting: Family Medicine

## 2011-11-19 NOTE — Telephone Encounter (Signed)
Refill request

## 2011-11-20 ENCOUNTER — Encounter: Payer: Self-pay | Admitting: Family Medicine

## 2011-11-20 ENCOUNTER — Ambulatory Visit (INDEPENDENT_AMBULATORY_CARE_PROVIDER_SITE_OTHER): Payer: Medicaid Other | Admitting: Family Medicine

## 2011-11-20 DIAGNOSIS — I10 Essential (primary) hypertension: Secondary | ICD-10-CM

## 2011-11-20 DIAGNOSIS — F17200 Nicotine dependence, unspecified, uncomplicated: Secondary | ICD-10-CM

## 2011-11-20 DIAGNOSIS — E669 Obesity, unspecified: Secondary | ICD-10-CM

## 2011-11-20 MED ORDER — VARENICLINE TARTRATE 0.5 MG PO TABS: 0.5000 mg | ORAL_TABLET | ORAL | Status: AC

## 2011-11-20 NOTE — Patient Instructions (Signed)
Weight loss: Exercise goal: exercise 4 x per week (once at [facility]- and 3 x with videos at home)- for  Nutrition goal: keep food diary for 1-3 days.  (track every little thing that you eat) and bring in to appt with nutritionist. Schedule appt with nutritionist.   Go to information session on surgical options if you would like.  Smoking: I will give you a refill on chantix.  See handout.  Blood pressure:  126/82, we can do a trial off of your bp pressure meds to see if they are still needed. , return in 2 weeks and we will follow up on blood pressure.

## 2011-11-20 NOTE — Telephone Encounter (Signed)
Refill complete 

## 2011-11-21 NOTE — Telephone Encounter (Signed)
Refilled yesterday at f/up appt.

## 2011-11-22 NOTE — Progress Notes (Signed)
  Subjective:    Patient ID: Carla Hanson, female    DOB: 08-08-1966, 46 y.o.   MRN: [id number]  HPI No show    Review of Systems     Objective:   Physical Exam        Assessment & Plan:

## 2011-11-23 ENCOUNTER — Encounter: Payer: Self-pay | Admitting: Family Medicine

## 2011-11-23 NOTE — Assessment & Plan Note (Signed)
Pt states that she has not been taking bp med and her bp is wnl today.  Pt would like to do a trial off bp medications.  Will return in 2 weeks for bp recheck.  If we decided to restart pt is due for regular creatinine screening.

## 2011-11-23 NOTE — Assessment & Plan Note (Signed)
Pt restarted on chantix.  Pt to return in 2 weeks for f/up.

## 2011-11-23 NOTE — Assessment & Plan Note (Signed)
Pt's goal is to exercise 4 x per week.  1 x with classmates at gym- walking for or more.  The other 3 days doing workout videos at home.  Pt to make an appt with nutritionist and bring food record.  Pt also expressing interest in surgical options.  Encouraged her to call [facility] about information sessions that are provideded on this topic.  If pt goes and would like to proceed I will put in a referral at that time. Pt to return to f/up and weight check in 2-4 weeks.

## 2011-11-23 NOTE — Progress Notes (Signed)
  Subjective:    Patient ID: Carla Hanson, female    DOB: September 28, 1966, 46 y.o.   MRN: [id number]  HPI Weight loss: Pt states that she is very motivated to get healthy and lose weight currently.  She states that her weight is affecting how she feels about herself.  She states that she doesn't eat excessively but that fried foods are her weakness.  She states that she exercises for 1 x per week currently with a class/group from her college.  They go to the [facility] together 1 x per week.   No recent weight gain or loss.  Has been overweight consistently for many years.  So shortness of breath with activity.  No chestpain.   Smoking: 1/2 ppd.  Pt stating that she is ready to quit.  Started chantix for a few days in the past but didn't follow through with plan to quit.  Pt states that she would like to try chantix again.   Blood pressure: BP elevated in past.  Has been off of bp med x 2 weeks.  Since bp wnl today asking to do a trial off her bp.  Pt bp today 126/82.  Very minimal exercise currently.  No dizziness. No headaches. No blurry vision.     Review of Systems As per above.     Objective:   Physical Exam  Constitutional: She is oriented to person, place, and time.       Morbidly obese.   HENT:  Head: Normocephalic and atraumatic.  Cardiovascular: Normal rate, regular rhythm and normal heart sounds.   No murmur heard. Pulmonary/Chest: Effort normal. No respiratory distress. She has no wheezes.  Abdominal: Soft. She exhibits no distension.  Musculoskeletal: She exhibits no edema.  Neurological: She is alert and oriented to person, place, and time.  Skin: No rash noted.  Psychiatric: She has a normal mood and affect. Her behavior is normal.          Assessment & Plan:

## 2011-12-05 ENCOUNTER — Ambulatory Visit: Payer: Medicaid Other | Admitting: Family Medicine

## 2011-12-31 ENCOUNTER — Ambulatory Visit (INDEPENDENT_AMBULATORY_CARE_PROVIDER_SITE_OTHER): Payer: Medicaid Other | Admitting: Family Medicine

## 2011-12-31 ENCOUNTER — Encounter: Payer: Self-pay | Admitting: Family Medicine

## 2011-12-31 DIAGNOSIS — F17200 Nicotine dependence, unspecified, uncomplicated: Secondary | ICD-10-CM

## 2011-12-31 DIAGNOSIS — N946 Dysmenorrhea, unspecified: Secondary | ICD-10-CM

## 2011-12-31 DIAGNOSIS — I10 Essential (primary) hypertension: Secondary | ICD-10-CM

## 2012-01-05 DIAGNOSIS — N946 Dysmenorrhea, unspecified: Secondary | ICD-10-CM | POA: Insufficient documentation

## 2012-01-05 HISTORY — DX: Dysmenorrhea, unspecified: N94.6

## 2012-01-05 NOTE — Progress Notes (Signed)
  Subjective:    Patient ID: Carla Hanson, female    DOB: 02-02-1966, 46 y.o.   MRN: [id number]  HPI 1)HTN: Has not been checking Blood pressure at home.  Is 139/79 today.  Stopped taking bp medication after last appt but began to have swelling in hands and feet.  Restarted bp medication 2 weeks ago and this improved.  No dizziness. No h/a.  No vision changes.   2) smoking: Has rx for chantix taking 1 tablet per day, did not take as directed on rx instructions.  Continues to smoke with taking chantix. Has not quit completely.   3)cramps in lower abd: Always occurs monthly during time of menstrual period.  Resolves at end of menstruation.  Tylenol and motrin do not seem to help with discomfort.  Nothing seems to make it better.  Nothings makes it worse.  Periods are regular/monthly  Last 5-7 days.  Moderate flow.  No dizziness.  No syncope.     Review of Systems As per above.     Objective:   Physical Exam  Constitutional: She appears well-developed and well-nourished.  HENT:  Head: Normocephalic and atraumatic.  Cardiovascular: Normal rate, regular rhythm and normal heart sounds.   No murmur heard. Pulmonary/Chest: Effort normal and breath sounds normal. No respiratory distress. She has no wheezes. She has no rales.  Abdominal: Soft. She exhibits no distension and no mass. There is tenderness (mild tenderness in lower abdominal area. ). There is no rebound and no guarding.  Musculoskeletal: She exhibits no edema.       No hand or ankle edema.   Neurological: She is alert.  Skin: Skin is warm. No rash noted.          Assessment & Plan:

## 2012-01-05 NOTE — Assessment & Plan Note (Signed)
Continue medication as directed.  BP WNL today.

## 2012-01-05 NOTE — Assessment & Plan Note (Signed)
Gave rx of tramadol for pain relief.  Pain occurs only with menstruation, no red flags on exam, no further work up at this time.

## 2012-01-05 NOTE — Assessment & Plan Note (Signed)
Reviewed with pt the correct usage of chantix.  Handout on proper usage given to patient.  Pt encouraged to pick start date and follow instruction sheet for dosing.

## 2012-02-25 ENCOUNTER — Encounter: Payer: Medicaid Other | Admitting: Family Medicine

## 2012-03-13 ENCOUNTER — Inpatient Hospital Stay (HOSPITAL_COMMUNITY)
Admission: EM | Admit: 2012-03-13 | Discharge: 2012-03-15 | DRG: 494 | Disposition: A | Discharge status: Home Health | Payer: Medicaid Other | Attending: Specialist | Admitting: Specialist

## 2012-03-13 ENCOUNTER — Encounter (HOSPITAL_COMMUNITY)
Admission: EM | Disposition: A | Discharge status: Home Health | Payer: Self-pay | Source: Home / Self Care | Attending: Specialist

## 2012-03-13 ENCOUNTER — Emergency Department (HOSPITAL_COMMUNITY): Payer: Medicaid Other

## 2012-03-13 ENCOUNTER — Emergency Department (HOSPITAL_COMMUNITY): Payer: Medicaid Other | Admitting: Anesthesiology

## 2012-03-13 ENCOUNTER — Encounter (HOSPITAL_COMMUNITY): Payer: Self-pay | Admitting: *Deleted

## 2012-03-13 ENCOUNTER — Encounter (HOSPITAL_COMMUNITY): Payer: Self-pay | Admitting: Anesthesiology

## 2012-03-13 DIAGNOSIS — W101XXA Fall (on)(from) sidewalk curb, initial encounter: Secondary | ICD-10-CM | POA: Diagnosis present

## 2012-03-13 DIAGNOSIS — S82891A Other fracture of right lower leg, initial encounter for closed fracture: Secondary | ICD-10-CM | POA: Diagnosis present

## 2012-03-13 DIAGNOSIS — E785 Hyperlipidemia, unspecified: Secondary | ICD-10-CM | POA: Diagnosis present

## 2012-03-13 DIAGNOSIS — F3289 Other specified depressive episodes: Secondary | ICD-10-CM | POA: Diagnosis present

## 2012-03-13 DIAGNOSIS — S82899A Other fracture of unspecified lower leg, initial encounter for closed fracture: Secondary | ICD-10-CM

## 2012-03-13 DIAGNOSIS — F17200 Nicotine dependence, unspecified, uncomplicated: Secondary | ICD-10-CM | POA: Diagnosis present

## 2012-03-13 DIAGNOSIS — I10 Essential (primary) hypertension: Secondary | ICD-10-CM | POA: Diagnosis present

## 2012-03-13 DIAGNOSIS — F329 Major depressive disorder, single episode, unspecified: Secondary | ICD-10-CM | POA: Diagnosis present

## 2012-03-13 HISTORY — PX: ORIF ANKLE FRACTURE: SHX5408

## 2012-03-13 LAB — PROTIME-INR: INR: 0.93 (ref 0.00–1.49)

## 2012-03-13 LAB — CBC
Hemoglobin: 12.8 g/dL (ref 12.0–15.0)
MCH: 26.8 pg (ref 26.0–34.0)
MCV: 80.5 fL (ref 78.0–100.0)
RBC: 4.78 MIL/uL (ref 3.87–5.11)

## 2012-03-13 LAB — BASIC METABOLIC PANEL
CO2: 23 mEq/L (ref 19–32)
Calcium: 8.6 mg/dL (ref 8.4–10.5)
Creatinine, Ser: 0.55 mg/dL (ref 0.50–1.10)
Glucose, Bld: 98 mg/dL (ref 70–99)
Sodium: 139 mEq/L (ref 135–145)

## 2012-03-13 SURGERY — OPEN REDUCTION INTERNAL FIXATION (ORIF) ANKLE FRACTURE
Anesthesia: General | Site: Ankle | Laterality: Right | Wound class: Clean

## 2012-03-13 MED ORDER — DEXAMETHASONE SODIUM PHOSPHATE 10 MG/ML IJ SOLN
INTRAMUSCULAR | Status: DC | PRN
  Administered 2012-03-13: 10 mg via INTRAVENOUS

## 2012-03-13 MED ORDER — CEFAZOLIN SODIUM-DEXTROSE 2-3 GM-% IV SOLR: 2.0000 g | Freq: Once | INTRAVENOUS | Status: DC

## 2012-03-13 MED ORDER — SUCCINYLCHOLINE CHLORIDE 20 MG/ML IJ SOLN
INTRAMUSCULAR | Status: DC | PRN
  Administered 2012-03-13: 100 mg via INTRAVENOUS

## 2012-03-13 MED ORDER — PROPOFOL 10 MG/ML IV BOLUS
INTRAVENOUS | Status: DC | PRN
  Administered 2012-03-13 (×2): 20 mg via INTRAVENOUS
  Administered 2012-03-13: 200 mg via INTRAVENOUS

## 2012-03-13 MED ORDER — FENTANYL CITRATE 0.05 MG/ML IJ SOLN
INTRAMUSCULAR | Status: DC | PRN
  Administered 2012-03-13: 50 ug via INTRAVENOUS
  Administered 2012-03-13 (×2): 100 ug via INTRAVENOUS

## 2012-03-13 MED ORDER — FENTANYL CITRATE 0.05 MG/ML IJ SOLN
50.0000 ug | Freq: Once | INTRAMUSCULAR | Status: AC
  Administered 2012-03-13: 50 ug via INTRAVENOUS
  Filled 2012-03-13: qty 2

## 2012-03-13 MED ORDER — FENTANYL CITRATE 0.05 MG/ML IJ SOLN
100.0000 ug | Freq: Once | INTRAMUSCULAR | Status: AC
  Administered 2012-03-13: 100 ug via INTRAVENOUS
  Filled 2012-03-13: qty 2

## 2012-03-13 MED ORDER — MIDAZOLAM HCL 5 MG/5ML IJ SOLN
INTRAMUSCULAR | Status: DC | PRN
  Administered 2012-03-13: 2 mg via INTRAVENOUS

## 2012-03-13 MED ORDER — ETOMIDATE 2 MG/ML IV SOLN
INTRAVENOUS | Status: AC
  Filled 2012-03-13: qty 10

## 2012-03-13 MED ORDER — LIDOCAINE HCL (CARDIAC) 20 MG/ML IV SOLN
INTRAVENOUS | Status: DC | PRN
  Administered 2012-03-13 (×2): 50 mg via INTRAVENOUS

## 2012-03-13 MED ORDER — BACITRACIN ZINC 500 UNIT/GM EX OINT
TOPICAL_OINTMENT | CUTANEOUS | Status: AC
  Administered 2012-03-13: 17:00:00
  Filled 2012-03-13: qty 0.9

## 2012-03-13 MED ORDER — CEFAZOLIN SODIUM-DEXTROSE 2-3 GM-% IV SOLR
INTRAVENOUS | Status: AC
  Filled 2012-03-13: qty 50

## 2012-03-13 MED ORDER — HYDROMORPHONE HCL PF 1 MG/ML IJ SOLN
INTRAMUSCULAR | Status: AC
  Filled 2012-03-13: qty 1

## 2012-03-13 MED ORDER — HYDROMORPHONE HCL PF 1 MG/ML IJ SOLN
1.0000 mg | Freq: Once | INTRAMUSCULAR | Status: AC
  Administered 2012-03-13: 1 mg via INTRAVENOUS
  Filled 2012-03-13: qty 1

## 2012-03-13 MED ORDER — ONDANSETRON HCL 4 MG/2ML IJ SOLN
INTRAMUSCULAR | Status: DC | PRN
  Administered 2012-03-13: 4 mg via INTRAVENOUS

## 2012-03-13 MED ORDER — ONDANSETRON HCL 4 MG/2ML IJ SOLN
4.0000 mg | Freq: Once | INTRAMUSCULAR | Status: AC
  Administered 2012-03-13: 4 mg via INTRAVENOUS
  Filled 2012-03-13: qty 2

## 2012-03-13 MED ORDER — TETANUS-DIPHTH-ACELL PERTUSSIS 5-2.5-18.5 LF-MCG/0.5 IM SUSP
0.5000 mL | Freq: Once | INTRAMUSCULAR | Status: DC
  Filled 2012-03-13: qty 0.5

## 2012-03-13 MED ORDER — FENTANYL CITRATE 0.05 MG/ML IJ SOLN
INTRAMUSCULAR | Status: AC
  Administered 2012-03-13: 50 ug
  Filled 2012-03-13: qty 2

## 2012-03-13 MED ORDER — LACTATED RINGERS IV SOLN
INTRAVENOUS | Status: DC | PRN
  Administered 2012-03-13: 22:00:00 via INTRAVENOUS

## 2012-03-13 MED ORDER — BUPIVACAINE HCL (PF) 0.5 % IJ SOLN
INTRAMUSCULAR | Status: AC
  Filled 2012-03-13: qty 30

## 2012-03-13 MED ORDER — VANCOMYCIN HCL 1000 MG IV SOLR
1500.0000 mg | INTRAVENOUS | Status: DC
  Filled 2012-03-13: qty 1500

## 2012-03-13 MED ORDER — ETOMIDATE 2 MG/ML IV SOLN
12.0000 mg | Freq: Once | INTRAVENOUS | Status: AC
  Administered 2012-03-13: 10 mg via INTRAVENOUS

## 2012-03-13 MED ORDER — EPHEDRINE SULFATE 50 MG/ML IJ SOLN
INTRAMUSCULAR | Status: DC | PRN
  Administered 2012-03-13 (×2): 5 mg via INTRAVENOUS

## 2012-03-13 SURGICAL SUPPLY — 49 items
BAG SPEC THK2 15X12 ZIP CLS (MISCELLANEOUS) ×1
BAG ZIPLOCK 12X15 (MISCELLANEOUS) ×2 IMPLANT
BANDAGE ELASTIC 3 VELCRO ST LF (GAUZE/BANDAGES/DRESSINGS) ×1 IMPLANT
BANDAGE ELASTIC 4 VELCRO ST LF (GAUZE/BANDAGES/DRESSINGS) ×1 IMPLANT
BANDAGE ELASTIC 6 VELCRO ST LF (GAUZE/BANDAGES/DRESSINGS) ×1 IMPLANT
BANDAGE GAUZE ELAST BULKY 4 IN (GAUZE/BANDAGES/DRESSINGS) ×1 IMPLANT
CLOTH BEACON ORANGE TIMEOUT ST (SAFETY) ×2 IMPLANT
CUFF TOURN SGL QUICK 44 (TOURNIQUET CUFF) ×1 IMPLANT
DECANTER SPIKE VIAL GLASS SM (MISCELLANEOUS) ×1 IMPLANT
DRAPE C-ARM 42X72 X-RAY (DRAPES) ×2 IMPLANT
DRAPE U-SHAPE 47X51 STRL (DRAPES) ×2 IMPLANT
DRSG PAD ABDOMINAL 8X10 ST (GAUZE/BANDAGES/DRESSINGS) ×2 IMPLANT
ELECT REM PT RETURN 9FT ADLT (ELECTROSURGICAL) ×2
ELECTRODE REM PT RTRN 9FT ADLT (ELECTROSURGICAL) ×1 IMPLANT
GAUZE XEROFORM 5X9 LF (GAUZE/BANDAGES/DRESSINGS) ×2 IMPLANT
GLOVE ECLIPSE 8.5 STRL (GLOVE) ×7 IMPLANT
GOWN STRL REIN XL XLG (GOWN DISPOSABLE) ×2 IMPLANT
KIT 1/3 TUB PL 5H 61M (Orthopedic Implant) IMPLANT
KIT BASIN OR (CUSTOM PROCEDURE TRAY) ×1 IMPLANT
MANIFOLD NEPTUNE II (INSTRUMENTS) ×2 IMPLANT
NEEDLE HYPO 22GX1.5 SAFETY (NEEDLE) ×2 IMPLANT
PACK LOWER EXTREMITY WL (CUSTOM PROCEDURE TRAY) ×2 IMPLANT
PAD CAST 4YDX4 CTTN HI CHSV (CAST SUPPLIES) ×2 IMPLANT
PADDING CAST COTTON 4X4 STRL (CAST SUPPLIES) ×2
PADDING CAST COTTON 6X4 STRL (CAST SUPPLIES) ×2 IMPLANT
POSITIONER SURGICAL ARM (MISCELLANEOUS) ×1 IMPLANT
PROS 1/3 TUB PL 5H 61M (Orthopedic Implant) ×2 IMPLANT
PROS RECONST PL 5H (Orthopedic Implant) ×2 IMPLANT
PROSTHESIS RECONST PL 5H (Orthopedic Implant) IMPLANT
SCREW CANC FT 4.0X20 (Screw) ×1 IMPLANT
SCREW CORTEX 3.5 14MM (Screw) ×1 IMPLANT
SCREW CORTEX 3.5 18MM (Screw) ×3 IMPLANT
SCREW CORTEX 3.5 22MM (Screw) ×1 IMPLANT
SCREW CORTEX 3.5 24MM (Screw) ×1 IMPLANT
SCREW LOCK CORT ST 3.5X14 (Screw) IMPLANT
SCREW LOCK CORT ST 3.5X18 (Screw) IMPLANT
SCREW LOCK CORT ST 3.5X22 (Screw) IMPLANT
SCREW LOCK CORT ST 3.5X24 (Screw) IMPLANT
SPLINT PLASTER CAST XFAST 5X30 (CAST SUPPLIES) IMPLANT
SPLINT PLASTER XFAST SET 5X30 (CAST SUPPLIES) ×2
SPONGE GAUZE 4X4 12PLY (GAUZE/BANDAGES/DRESSINGS) ×2 IMPLANT
STAPLER VISISTAT (STAPLE) ×2 IMPLANT
SUT ETHILON 4 0 PS 2 18 (SUTURE) ×2 IMPLANT
SUT VIC AB 0 CT1 27 (SUTURE) ×2
SUT VIC AB 0 CT1 27XBRD ANTBC (SUTURE) ×1 IMPLANT
SUT VIC AB 2-0 CT1 27 (SUTURE) ×2
SUT VIC AB 2-0 CT1 TAPERPNT 27 (SUTURE) ×1 IMPLANT
SYR CONTROL 10ML LL (SYRINGE) ×2 IMPLANT
TOWEL OR 17X26 10 PK STRL BLUE (TOWEL DISPOSABLE) ×4 IMPLANT

## 2012-03-13 NOTE — ED Notes (Addendum)
Per EMS pt was leaving dentist office and mis-stepped. Pt has obvious deformity to right ankle. Pt was given of fentanyl IV en route to hospital.

## 2012-03-13 NOTE — ED Provider Notes (Addendum)
History     CSN: [id number]  Arrival date & time 03/13/12  1220   First MD Initiated Contact with Patient 03/13/12 1255      Chief Complaint  Patient presents with  . Ankle Pain    (Consider location/radiation/quality/duration/timing/severity/associated sxs/prior treatment) Patient is a 46 y.o. female presenting with ankle pain. The history is provided by the patient.  Ankle Pain   pt s/p fall at dentist today. Twisted right ankle, c/o right ankle pain and deformity. Constant, severe dull pain worse w movement/palpation. No numbness/weakness. Denies knee pain. Denies faintness or loc. No headache. No neck or back pain. Denies other pain.   Past Medical History  Diagnosis Date  . Depression   . Hypertension   . Lipoma     Abdomen  . Obesity   . Hyperlipidemia   . Tobacco abuse   . LIPOMA 01/20/2008    Past Surgical History  Procedure Date  . Lipoma removed 03/2011    Family History  Problem Relation Age of Onset  . Asthma Father     History  Substance Use Topics  . Smoking status: Current Everyday Smoker -- 0.3 packs/day    Types: Cigarettes  . Smokeless tobacco: Never Used   Comment: in process of quitting  . Alcohol Use: Yes     1 beer / week    OB History    Grav Para Term Preterm Abortions TAB SAB Ect Mult Living                  Review of Systems  Constitutional: Negative for fever and chills.  HENT: Negative for neck pain.   Eyes: Negative for pain.  Respiratory: Negative for shortness of breath.   Cardiovascular: Negative for chest pain.  Gastrointestinal: Negative for vomiting and abdominal pain.  Genitourinary: Negative for flank pain.  Musculoskeletal: Negative for back pain.  Skin: Negative for rash.  Neurological: Negative for headaches.  Hematological: Does not bruise/bleed easily.  Psychiatric/Behavioral: Negative for confusion.    Allergies  Review of patient's allergies indicates no known allergies.  Home Medications   Current  Outpatient Rx  Name Route Sig Dispense Refill  . CITALOPRAM HYDROBROMIDE 20 MG PO TABS  TAKE ONE TABLET BY MOUTH DAILY FOR MOOD 30 tablet 6  . IBUPROFEN 200 MG PO TABS Oral Take 200 mg by mouth every 6 (six) hours as needed. pain    . LISINOPRIL-HYDROCHLOROTHIAZIDE 20-25 MG PO TABS  TAKE 1 TABLET BY MOUTH EVERY DAY FOR BLOOD PRESSURE 30 tablet 2    BP 142/70  Pulse 81  Temp(Src) 98.9 F (37.2 C) (Oral)  Resp 20  SpO2 95%  Physical Exam  Nursing note and vitals reviewed. Constitutional: She is oriented to person, place, and time. She appears well-developed and well-nourished. No distress.  HENT:  Head: Atraumatic.  Eyes: Conjunctivae are normal. Pupils are equal, round, and reactive to light. No scleral icterus.  Neck: Neck supple. No tracheal deviation present.  Cardiovascular: Normal rate, regular rhythm, normal heart sounds and intact distal pulses.   Pulmonary/Chest: Effort normal and breath sounds normal. No respiratory distress. She exhibits no tenderness.  Abdominal: Soft. Normal appearance. She exhibits no distension. There is no tenderness.  Musculoskeletal:       Deformity right ankle. Dp/pt palp. Cap refill toes intact. Skin w abrasion but no open fx noted.   Neurological: She is alert and oriented to person, place, and time.       Able to move toes, feel light  touch/pressure/movement toes.   Skin: Skin is warm and dry. No rash noted.    ED Course  Procedures (including critical care time)  Dg Ankle Complete Right  03/13/2012  *RADIOLOGY REPORT*  Clinical Data: Ankle pain post fall  RIGHT ANKLE - COMPLETE 3+ VIEW  Comparison: None.  Findings: Two views of the right ankle submitted.  There is complete ankle dislocation with anterior and medial position of the distal tibia and disruption of the ankle mortise.  Displaced fracture of the distal right fibula. Anterior and medial migration of the superior fibula. Distal fibular fragment is still articulated with the talus.   IMPRESSION: There is displaced fracture of distal left fibula.  Ankle dislocation and disruption of the ankle mortise with anterior and medial migration of the tibia from the tibiotalar joint.  Original Report Authenticated By: Natasha Mead, M.D.      MDM  Iv ns. Dilaudid 1 mg iv. zofran iv. Pt arrived w splint/intact.   Prepared to reduce, procedural sedating protocol.  Orthopedic md on call paged - Dr Otelia Sergeant in or, he will see in ed, requests we reduce.  Etomidate iv.  Ankle reduced, post splint w stirrups. Ankle elevated/iced.   Recheck distal pulse palpable, normal cap refill in toes, pain much improved.   Repeat xrays, reduction of dislocation.   Ortho coming to see.     Procedural sedation Performed by: Suzi Roots Consent: Verbal consent obtained. Risks and benefits: risks, benefits and alternatives were discussed Required items: required blood products, implants, devices, and special equipment available Patient identity confirmed: arm band and provided demographic data Time out: Immediately prior to procedure a "time out" was called to verify the correct patient, procedure, equipment, support staff and site/side marked as required.  Sedation type: moderate (conscious) sedation NPO time confirmed and considedered  Sedatives: ETOMIDATE  Physician Time at Bedside: 30  Vitals: Vital signs were monitored during sedation. Cardiac Monitor, pulse oximeter Patient tolerance: Patient tolerated the procedure well with no immediate complications. Comments: Pt with uneventful recovered. Returned to pre-procedural sedation baseline  Small abrasion to right knee anteriorly. Knee stable, no effusion or gross ligament laxity. Tetanus unknown. Tet im.    Pt signed out to Dr I Lynelle Doctor that orthopedics coming to see/admit, and that pre-op labs remain pending currently.   Recheck pt comfortable, pain controlled. dp palp. Normal cap refill.   Suzi Roots, MD 03/13/12 1544  Suzi Roots, MD 03/13/12 1610  Suzi Roots, MD 03/13/12 [phone]

## 2012-03-13 NOTE — Progress Notes (Signed)
ED CM spoke with pt who has not previously used home health services.  Reviewed d/c planning services and cm consult from admission RN.  Pt states she has 3 daughters that will be assisting at home Agrees to review home health agency list so if services are needed at d/c she will be able to provide her choice of agencies.  ED RN also assisted in explaining services to pt when she inquired if admission nurse "thought I am crazy" . ED RN and Cm explained to pt home health services are not related to mental issues. Pending pt choice of agency if services needed at d/c

## 2012-03-13 NOTE — ED Notes (Addendum)
Spoke with Surgeon Otelia Sergeant, reports that he is in OR and will see pt in a few hours after he completes current surgery. Requests staff to keep pt NPO and "we plan to work on her tonight." pt informed of delay. Pt frustrated and states "I can't stay here, I'm hungry, thirsty and my tongue is dry, I can't do this." Pt encouraged to relax. Pt then stated "can I go to the BR, no one has offered to help me to the BR." Wheelchair obtained and pt assisted to BR. Pt did not bear weight on right leg. Pt now sitting in chair at bedside. "states I'm tired of laying in bed."

## 2012-03-13 NOTE — Progress Notes (Signed)
Home health list shared with pt She has a copy  HOME HEALTH AGENCIES SERVING [place]   Agencies that are Medicare-Certified and are affiliated with [facility]  Telephone Number Address  [facility]   The [facility] has ownership interest in this company; however, you are under no obligation to use this agency. [phone] or  [phone] [address] [place], [place] [address]   Agencies that are Medicare-Certified and are not affiliated with [facility] Agency Telephone Number Address  [facility] [phone] Fax [phone] [address] [place], [place]  [address]  [facility] [phone] or [phone] Fax [phone] [address] [place], [place] [address]  [facility] [phone] Fax [phone] [address] [place], [place] [address]  [facility] [phone] Fax [phone] [address] [place], [place]  [address]  [facility] [phone] Fax [phone] [address], [address] [place], [place] [address]  [facility] [phone] [address] [place], [place] [address]  [facility] [phone]  [address] [place], [place] [address]  [facility] [phone] or [phone] Fax [phone] [address] [place], [place]  [address]  [facility] [phone] Fax [phone] [address] [place], [place]  [address]  [facility]  [phone] Fax [phone] [address] [place], [place] [address]

## 2012-03-13 NOTE — ED Notes (Signed)
Pt assisted back to bed

## 2012-03-13 NOTE — ED Notes (Signed)
ZOX:[id number]<AV> Expected date:03/13/12<BR> Expected time:<BR> Means of arrival:<BR> Comments:<BR> EMS 11 GC - ankle injury

## 2012-03-13 NOTE — H&P (Signed)
Carla Hanson is an 46 y.o. female.   Chief Complaint: Right ankle Pain and deformity. HPI: 46 year old female in usual good health till this morning  1100 AM when she fell while walking down a curb After just leaving the dentist's office after undergoing a wisdom tooth extraction. Presented to [facility] with A right ankle with obvious deformity. Call received this afternoon 1430 while performing surgery at [facility]. Requested the ER physician Dr. Arizona Constable perform closed reduction with conscious sedation. This was done  With excellent improvement in alignment. Xrays show the fibula fracture well reduced but persistent widening Of the medial joint line. She also has left ankle pain with swelling and discomfort.  Past Medical History  Diagnosis Date  . Depression   . Hypertension   . Lipoma     Abdomen  . Obesity   . Hyperlipidemia   . Tobacco abuse   . LIPOMA 01/20/2008    Past Surgical History  Procedure Date  . Lipoma removed 03/2011  . Cesarean section     Family History  Problem Relation Age of Onset  . Asthma Father    Social History:  reports that she has been smoking Cigarettes.  She has a 7.5 pack-year smoking history. She has never used smokeless tobacco. She reports that she drinks alcohol. She reports that she does not use illicit drugs.  Allergies: No Known Allergies   (Not in a hospital admission)  Results for orders placed during the hospital encounter of 03/13/12 (from the past 48 hour(s))  CBC     Status: Abnormal   Collection Time   03/13/12  4:40 PM      Component Value Range Comment   WBC 11.6 (*) 4.0 - 10.5 (K/uL)    RBC 4.78  3.87 - 5.11 (MIL/uL)    Hemoglobin 12.8  12.0 - 15.0 (g/dL)    HCT 16.1  09.6 - 04.5 (%)    MCV 80.5  78.0 - 100.0 (fL)    MCH 26.8  26.0 - 34.0 (pg)    MCHC 33.2  30.0 - 36.0 (g/dL)    RDW 40.9 (*) 81.1 - 15.5 (%)    Platelets 346  150 - 400 (K/uL)   BASIC METABOLIC PANEL     Status: Normal   Collection Time   03/13/12  4:40 PM   Component Value Range Comment   Sodium 139  135 - 145 (mEq/L)    Potassium 3.6  3.5 - 5.1 (mEq/L)    Chloride 104  96 - 112 (mEq/L)    CO2 23  19 - 32 (mEq/L)    Glucose, Bld 98  70 - 99 (mg/dL)    BUN 10  6 - 23 (mg/dL)    Creatinine, Ser 9.14  0.50 - 1.10 (mg/dL)    Calcium 8.6  8.4 - 10.5 (mg/dL)    GFR calc non Af Amer >90  >90 (mL/min)    GFR calc Af Amer >90  >90 (mL/min)   PROTIME-INR     Status: Normal   Collection Time   03/13/12  4:40 PM      Component Value Range Comment   Prothrombin Time 12.7  11.6 - 15.2 (seconds)    INR 0.93  0.00 - 1.49     Dg Ankle Complete Right  03/13/2012  *RADIOLOGY REPORT*  Clinical Data: Postreduction  RIGHT ANKLE - COMPLETE 3+ VIEW  Comparison: Prior films same day  Findings: Postreduction there is significant improvement with near anatomic alignment of the right  ankle joint.  There is significant improvement in alignment of fibular fracture with minimal step-off of the bony fragment  in distal fibula. Study is limited by casting material artifact.   IMPRESSION:   Postreduction there is significant improvement with near anatomic alignment of the right ankle joint.  There is significant improvement in alignment of fibular fracture with minimal step-off of the bony fragment  in distal fibula.  Original Report Authenticated By: Natasha Mead, M.D.   Dg Ankle Complete Right  03/13/2012  *RADIOLOGY REPORT*  Clinical Data: Ankle pain post fall  RIGHT ANKLE - COMPLETE 3+ VIEW  Comparison: None.  Findings: Two views of the right ankle submitted.  There is complete ankle dislocation with anterior and medial position of the distal tibia and disruption of the ankle mortise.  Displaced fracture of the distal right fibula. Anterior and medial migration of the superior fibula. Distal fibular fragment is still articulated with the talus.  IMPRESSION: There is displaced fracture of distal left fibula.  Ankle dislocation and disruption of the ankle mortise with anterior and  medial migration of the tibia from the tibiotalar joint.  Original Report Authenticated By: Natasha Mead, M.D.    Review of Systems  Constitutional: Negative for fever, chills, weight loss, malaise/fatigue and diaphoresis.  HENT: Negative for hearing loss, ear pain, nosebleeds, congestion, sore throat, neck pain, tinnitus and ear discharge.   Eyes: Negative for blurred vision, double vision, photophobia, pain, discharge and redness.  Respiratory: Negative for cough, hemoptysis, sputum production, shortness of breath, wheezing and stridor.   Cardiovascular: Negative for chest pain, palpitations, orthopnea, claudication, leg swelling and PND.  Gastrointestinal: Negative for heartburn, nausea, vomiting, abdominal pain, diarrhea, constipation, blood in stool and melena.  Genitourinary: Negative for dysuria, urgency, frequency, hematuria and flank pain.  Musculoskeletal: Negative for myalgias, back pain, joint pain and falls.  Skin: Negative for itching and rash.  Neurological: Negative for dizziness, tingling, tremors, sensory change, speech change, focal weakness, seizures, loss of consciousness, weakness and headaches.  Endo/Heme/Allergies: Negative for environmental allergies and polydipsia. Does not bruise/bleed easily.  Psychiatric/Behavioral: Negative for depression, suicidal ideas, hallucinations, memory loss and substance abuse. The patient is not nervous/anxious and does not have insomnia.     Blood pressure 142/69, pulse 87, temperature 98.9 F (37.2 C), temperature source Oral, resp. rate 18, last menstrual period 02/17/2012, SpO2 98.00%. Physical Exam  Constitutional: She appears well-developed and well-nourished. No distress.  HENT:  Head: Normocephalic and atraumatic.  Right Ear: External ear normal.  Left Ear: External ear normal.  Nose: Nose normal.  Mouth/Throat: Oropharynx is clear and moist. No oropharyngeal exudate.  Eyes: Conjunctivae and EOM are normal. Pupils are equal,  round, and reactive to light. Right eye exhibits no discharge. Left eye exhibits no discharge. No scleral icterus.  Neck: Neck supple. No JVD present. No tracheal deviation present. No thyromegaly present.  Cardiovascular: Normal rate, regular rhythm and intact distal pulses.  Exam reveals no gallop and no friction rub.   No murmur heard. Respiratory: Effort normal and breath sounds normal. No respiratory distress. She has no wheezes. She has no rales. She exhibits no tenderness.  GI: Soft. Bowel sounds are normal. She exhibits no distension and no mass. There is no tenderness. There is no rebound and no guarding.  Musculoskeletal: Normal range of motion. She exhibits no edema and no tenderness.  Lymphadenopathy:    She has no cervical adenopathy.  Neurological: She displays normal reflexes. No cranial nerve deficit. She exhibits normal muscle tone.  Coordination normal.  Skin: Skin is warm and dry. No rash noted. She is not diaphoretic. No erythema. No pallor.  Psychiatric: She has a normal mood and affect. Her behavior is normal. Judgment and thought content normal.  Orthopedic Eval: Right foot and ankle with swelling no break in skin. Right foot N-V intact pulses normal. Sensory normal Left ankle with swelling over the left anterolateral ankle ligaments. No open wounds tender to touch over the Right anterolateral ankle ligaments. Pulses PT and DP are normal Left ankle xrays show widening of the medial joint space right ankle s/p reduction of the right lateral malleolus. The  Lateral malleolus reduction is nearly anatomic but the medial joint line remains widened. This may be due to soft tissue Interposed posterior tibialis tendon or deltoid ligament.  Assessment/Plan Left ankle fracture dislocation. S/p reduction with persistence of widened medial joint line c/w  Soft tissue interposed Or rotational deformity of the fibula. Right ankle sprain  Plan:OR tonight to undergo right ankle ORIF  with possible exploration of the medial joint line and repair of deltoid  Ligament. Admit post op for rehab due to sprain opposite ankle may be some delay in PT/OT progress.    Keva Darty E 03/13/2012, 9:25 PM

## 2012-03-13 NOTE — Anesthesia Procedure Notes (Signed)
Procedure Name: Intubation Date/Time: 03/13/2012 10:46 PM Performed by: Doran Clay Pre-anesthesia Checklist: Patient identified, Timeout performed, Emergency Drugs available, Suction available and Patient being monitored Patient Re-evaluated:Patient Re-evaluated prior to inductionOxygen Delivery Method: Circle system utilized Preoxygenation: Pre-oxygenation with 100% oxygen Intubation Type: IV induction, Cricoid Pressure applied and Rapid sequence Laryngoscope Size: Mac and 4 Grade View: Grade I Tube type: Oral Tube size: 7.0 mm Number of attempts: 1 Airway Equipment and Method: Stylet Placement Confirmation: ETT inserted through vocal cords under direct vision,  breath sounds checked- equal and bilateral and positive ETCO2 Secured at: 22 cm Tube secured with: Tape Dental Injury: Teeth and Oropharynx as per pre-operative assessment

## 2012-03-13 NOTE — Progress Notes (Addendum)
Received call from ER patient with fracture dislocation of the Right ankle closed. I am in surgery at [facility] and not available till later today to see the patient. I requested the ER physician Dr. Denton Lank to reduct the fracture and splint with Conscious sedation and the post reduction is excellent. However I expect the decree of swelling due to this injury over several days will prevent intervention for up to 2 weeks To stablilize the injury. I plan to see the patient as soon as I Can and proceed to OR tonight . She should be maintained NPO.

## 2012-03-13 NOTE — Anesthesia Preprocedure Evaluation (Signed)
Anesthesia Evaluation  Patient identified by MRN, date of birth, ID band Patient awake  General Assessment Comment:NPO today  Reviewed: Allergy & Precautions, H&P , NPO status , Patient's Chart, lab work & pertinent test results, reviewed documented beta blocker date and time   Airway Mallampati: III TM Distance: >3 FB Neck ROM: Full    Dental  (+) Dental Advisory Given and Teeth Intact   Pulmonary Current Smoker,  breath sounds clear to auscultation        Cardiovascular hypertension, Pt. on medications Rhythm:Regular Rate:Normal  Denies cardiac symptoms   Neuro/Psych negative neurological ROS  negative psych ROS   GI/Hepatic negative GI ROS, Neg liver ROS,   Endo/Other  Morbid obesity  Renal/GU negative Renal ROS  negative genitourinary   Musculoskeletal   Abdominal   Peds negative pediatric ROS (+)  Hematology negative hematology ROS (+)   Anesthesia Other Findings   Reproductive/Obstetrics negative OB ROS                           Anesthesia Physical Anesthesia Plan  ASA: III and Emergent  Anesthesia Plan: General   Post-op Pain Management:    Induction: Intravenous, Rapid sequence and Cricoid pressure planned  Airway Management Planned: Oral ETT  Additional Equipment:   Intra-op Plan:   Post-operative Plan: Extubation in OR  Informed Consent: I have reviewed the patients History and Physical, chart, labs and discussed the procedure including the risks, benefits and alternatives for the proposed anesthesia with the patient or authorized representative who has indicated his/her understanding and acceptance.   Dental advisory given  Plan Discussed with: CRNA and Surgeon  Anesthesia Plan Comments:         Anesthesia Quick Evaluation

## 2012-03-14 ENCOUNTER — Inpatient Hospital Stay (HOSPITAL_COMMUNITY): Payer: Medicaid Other

## 2012-03-14 ENCOUNTER — Encounter (HOSPITAL_COMMUNITY): Payer: Self-pay

## 2012-03-14 MED ORDER — ONDANSETRON HCL 4 MG/2ML IJ SOLN: 4.0000 mg | Freq: Four times a day (QID) | INTRAMUSCULAR | Status: DC | PRN

## 2012-03-14 MED ORDER — METHOCARBAMOL 500 MG PO TABS
500.0000 mg | ORAL_TABLET | Freq: Four times a day (QID) | ORAL | Status: DC | PRN
  Administered 2012-03-14 – 2012-03-15 (×3): 500 mg via ORAL
  Filled 2012-03-14 (×3): qty 1

## 2012-03-14 MED ORDER — CEFAZOLIN SODIUM-DEXTROSE 2-3 GM-% IV SOLR
2.0000 g | Freq: Four times a day (QID) | INTRAVENOUS | Status: AC
  Administered 2012-03-14 (×3): 2 g via INTRAVENOUS
  Filled 2012-03-14 (×3): qty 50

## 2012-03-14 MED ORDER — HYDROMORPHONE HCL PF 1 MG/ML IJ SOLN
0.2500 mg | INTRAMUSCULAR | Status: DC | PRN
  Administered 2012-03-14 (×2): 0.5 mg via INTRAVENOUS

## 2012-03-14 MED ORDER — CITALOPRAM HYDROBROMIDE 20 MG PO TABS
20.0000 mg | ORAL_TABLET | Freq: Every day | ORAL | Status: DC
  Filled 2012-03-14 (×2): qty 1

## 2012-03-14 MED ORDER — LISINOPRIL-HYDROCHLOROTHIAZIDE 20-25 MG PO TABS: 1.0000 | ORAL_TABLET | Freq: Every day | ORAL | Status: DC

## 2012-03-14 MED ORDER — BUPIVACAINE HCL (PF) 0.5 % IJ SOLN
INTRAMUSCULAR | Status: DC | PRN
  Administered 2012-03-14: 10 mL

## 2012-03-14 MED ORDER — 0.9 % SODIUM CHLORIDE (POUR BTL) OPTIME
TOPICAL | Status: DC | PRN
  Administered 2012-03-14: 100 mL

## 2012-03-14 MED ORDER — DIPHENHYDRAMINE HCL 12.5 MG/5ML PO ELIX: 12.5000 mg | ORAL_SOLUTION | ORAL | Status: DC | PRN

## 2012-03-14 MED ORDER — HYDROMORPHONE HCL PF 1 MG/ML IJ SOLN
INTRAMUSCULAR | Status: AC
  Filled 2012-03-14: qty 1

## 2012-03-14 MED ORDER — HYDROCHLOROTHIAZIDE 25 MG PO TABS
25.0000 mg | ORAL_TABLET | Freq: Every day | ORAL | Status: DC
  Administered 2012-03-14 – 2012-03-15 (×2): 25 mg via ORAL
  Filled 2012-03-14 (×2): qty 1

## 2012-03-14 MED ORDER — OXYCODONE HCL 5 MG PO TABS
5.0000 mg | ORAL_TABLET | ORAL | Status: DC | PRN
  Administered 2012-03-14 (×5): 10 mg via ORAL
  Filled 2012-03-14 (×5): qty 2

## 2012-03-14 MED ORDER — METOCLOPRAMIDE HCL 5 MG/ML IJ SOLN: 5.0000 mg | Freq: Three times a day (TID) | INTRAMUSCULAR | Status: DC | PRN

## 2012-03-14 MED ORDER — METOCLOPRAMIDE HCL 10 MG PO TABS: 5.0000 mg | ORAL_TABLET | Freq: Three times a day (TID) | ORAL | Status: DC | PRN

## 2012-03-14 MED ORDER — METHOCARBAMOL 100 MG/ML IJ SOLN
500.0000 mg | Freq: Four times a day (QID) | INTRAVENOUS | Status: DC | PRN
  Administered 2012-03-14: 500 mg via INTRAVENOUS
  Filled 2012-03-14: qty 5

## 2012-03-14 MED ORDER — HYDROMORPHONE HCL PF 1 MG/ML IJ SOLN
0.5000 mg | INTRAMUSCULAR | Status: DC | PRN
  Administered 2012-03-14: 0.5 mg via INTRAVENOUS
  Filled 2012-03-14: qty 1

## 2012-03-14 MED ORDER — HYDROCODONE-ACETAMINOPHEN 7.5-325 MG PO TABS
1.0000 | ORAL_TABLET | ORAL | Status: DC | PRN
  Administered 2012-03-14 – 2012-03-15 (×4): 2 via ORAL
  Filled 2012-03-14 (×4): qty 2

## 2012-03-14 MED ORDER — CHLORHEXIDINE GLUCONATE 4 % EX LIQD: 60.0000 mL | Freq: Once | CUTANEOUS | Status: DC

## 2012-03-14 MED ORDER — TEMAZEPAM 15 MG PO CAPS: 15.0000 mg | ORAL_CAPSULE | Freq: Every evening | ORAL | Status: DC | PRN

## 2012-03-14 MED ORDER — ONDANSETRON HCL 4 MG PO TABS: 4.0000 mg | ORAL_TABLET | Freq: Four times a day (QID) | ORAL | Status: DC | PRN

## 2012-03-14 MED ORDER — ENOXAPARIN SODIUM 30 MG/0.3ML ~~LOC~~ SOLN
30.0000 mg | Freq: Two times a day (BID) | SUBCUTANEOUS | Status: DC
  Administered 2012-03-14 – 2012-03-15 (×3): 30 mg via SUBCUTANEOUS
  Filled 2012-03-14 (×4): qty 0.3

## 2012-03-14 MED ORDER — LISINOPRIL 20 MG PO TABS
20.0000 mg | ORAL_TABLET | Freq: Every day | ORAL | Status: DC
  Administered 2012-03-14 – 2012-03-15 (×2): 20 mg via ORAL
  Filled 2012-03-14 (×2): qty 1

## 2012-03-14 NOTE — Anesthesia Postprocedure Evaluation (Signed)
  Anesthesia Post-op Note  Patient: Carla Hanson  Procedure(s) Performed: Procedure(s) (LRB): OPEN REDUCTION INTERNAL FIXATION (ORIF) ANKLE FRACTURE (Right)  Patient Location: PACU  Anesthesia Type: General  Level of Consciousness: oriented and sedated  Airway and Oxygen Therapy: Patient Spontanous Breathing and Patient connected to nasal cannula oxygen  Post-op Pain: mild  Post-op Assessment: Post-op Vital signs reviewed, Patient's Cardiovascular Status Stable, Respiratory Function Stable and Patent Airway  Post-op Vital Signs: stable  Complications: No apparent anesthesia complications

## 2012-03-14 NOTE — Evaluation (Signed)
Occupational Therapy Evaluation Patient Details Name: Carla Hanson MRN: [id number] DOB: Jan 01, 1966 Today's Date: 03/14/2012 Time: 4098-1191 OT Time Calculation (min): 33 min  OT Assessment / Plan / Recommendation Clinical Impression  Pt is a 46 yo female who presents with a R ankle fx s/p ORIF and L ankle sprain and is NWB on the R. Skilled OT recommended to maximize I w/BADLs to min A level in prep for safe d/c home with HHOT.    OT Assessment  Patient needs continued OT Services    Follow Up Recommendations  Home health OT    Barriers to Discharge      Equipment Recommendations  Other (comment) (bari standard walker and 3:1)    Recommendations for Other Services    Frequency  Min 2X/week    Precautions / Restrictions Precautions Precautions: Fall Restrictions Weight Bearing Restrictions: Yes RLE Weight Bearing: Non weight bearing   Pertinent Vitals/Pain     ADL  Grooming: Simulated;Set up Where Assessed - Grooming: Unsupported sitting Upper Body Bathing: Simulated;Set up Where Assessed - Upper Body Bathing: Sitting, bed;Unsupported Lower Body Bathing: Simulated;+1 Total assistance Where Assessed - Lower Body Bathing: Sit to stand from bed Upper Body Dressing: Simulated;Set up Where Assessed - Upper Body Dressing: Sitting, bed;Unsupported Lower Body Dressing: Simulated;+1 Total assistance Where Assessed - Lower Body Dressing: Sit to stand from bed Toilet Transfer: Performed Toilet Transfer Method: Ambulating Toilet Transfer Equipment: Bedside commode Toileting - Clothing Manipulation: Performed Where Assessed - Glass blower/designer Manipulation: Standing Toileting - Hygiene: Performed Where Assessed - Toileting Hygiene: Standing Tub/Shower Transfer: Not assessed Tub/Shower Transfer Method: Not assessed Ambulation Related to ADLs: Pt fatigues quickly, difficulty ambulating due to L ankle sprain as well.    OT Diagnosis: Generalized weakness  OT Problem List:  Decreased activity tolerance;Decreased safety awareness;Decreased knowledge of use of DME or AE;Pain OT Treatment Interventions: Self-care/ADL training;Therapeutic activities;DME and/or AE instruction;Patient/family education   OT Goals Acute Rehab OT Goals OT Goal Formulation: With patient/family Time For Goal Achievement: 03/28/12 Potential to Achieve Goals: Good ADL Goals Pt Will Perform Grooming: with supervision;Standing at sink ADL Goal: Grooming - Progress: Goal set today Pt Will Perform Lower Body Bathing: with min assist;Sit to stand from chair;Sit to stand from bed ADL Goal: Lower Body Bathing - Progress: Goal set today Pt Will Perform Lower Body Dressing: with min assist;Sit to stand from bed;Sit to stand from chair ADL Goal: Lower Body Dressing - Progress: Goal set today Pt Will Transfer to Toilet: Other (comment);3-in-1;Stand pivot transfer;Ambulation (Minguard A) ADL Goal: Toilet Transfer - Progress: Goal set today Pt Will Perform Toileting - Clothing Manipulation: with min assist;Sitting on 3-in-1 or toilet;Standing ADL Goal: Toileting - Clothing Manipulation - Progress: Goal set today Pt Will Perform Toileting - Hygiene: with min assist;Sit to stand from 3-in-1/toilet ADL Goal: Toileting - Hygiene - Progress: Goal set today  Visit Information  Last OT Received On: 03/14/12 Assistance Needed: +2 PT/OT Co-Evaluation/Treatment: Yes    Subjective Data  Subjective: "i dont want to use that old lady walker."   Prior Functioning  Home Living Lives With: Daughter Available Help at Discharge: Family;Friend(s) (staying with mother) Type of Home: Apartment Home Access: Level entry Entrance Stairs-Rails: Left Home Layout: One level Bathroom Shower/Tub: Engineer, manufacturing systems: Standard Home Adaptive Equipment: Grab bars in shower;Grab bars around toilet Prior Function Level of Independence: Independent Able to Take Stairs?: Yes Driving: Yes Vocation:  Student Communication Communication: No difficulties Dominant Hand: Right    Cognition  Overall Cognitive  Status: Appears within functional limits for tasks assessed/performed Arousal/Alertness: Awake/alert Orientation Level: Appears intact for tasks assessed Behavior During Session: [facility] for tasks performed    Extremity/Trunk Assessment Right Upper Extremity Assessment RUE ROM/Strength/Tone: [facility] for tasks assessed Left Upper Extremity Assessment LUE ROM/Strength/Tone: WFL for tasks assessed Right Lower Extremity Assessment RLE ROM/Strength/Tone: Unable to fully assess;Due to precautions RLE Sensation: WFL - Light Touch RLE Coordination: WFL - gross motor Left Lower Extremity Assessment LLE ROM/Strength/Tone: WFL for tasks assessed LLE Sensation: WFL - Light Touch LLE Coordination: WFL - gross motor Trunk Assessment Trunk Assessment: Normal   Mobility Bed Mobility Bed Mobility: Supine to Sit Supine to Sit: HOB elevated;4: Min assist Details for Bed Mobility Assistance: Min assist for RLE off of bed with cues for hand placement and for continued deep breathing.  Transfers Sit to Stand: 1: +2 Total assist;With upper extremity assist;From bed;From elevated surface Sit to Stand: Patient Percentage: 70% Stand to Sit: 1: +2 Total assist;Without upper extremity assist;With armrests;To chair/3-in-1 Stand to Sit: Patient Percentage: 70% Details for Transfer Assistance: Some assist to rise and +2 for steadying/safety/line management.  cues for hand placement and to maintain NWB.     Exercise    Balance    End of Session OT - End of Session Activity Tolerance: Patient limited by pain;Patient limited by fatigue Patient left: in chair;with call bell/phone within reach   Carla Hanson A OTR/L [phone] 03/14/2012, 2:02 PM

## 2012-03-14 NOTE — Progress Notes (Signed)
Report given to [facility] RN pt to room 1540

## 2012-03-14 NOTE — Evaluation (Signed)
Physical Therapy Evaluation Patient Details Name: Carla Hanson MRN: [id number] DOB: 11/04/1966 Today's Date: 03/14/2012 Time: 4098-1191 PT Time Calculation (min): 34 min  PT Assessment / Plan / Recommendation Clinical Impression  Pt presents with R fibula fx s/p ORIF with decreased strength, ROM, and mobility.  Tolerated some steps from bed to 3in1, however noted that she was very shaky and due to L ankle sprain, ambulation is difficult.  Educated pt in alphabet exercises for L ankle.  Pt will benefit from skilled PT in acute venue to address deficits.  PT recommends HHPT for follow up in order to return pt to PLOF.      PT Assessment  Patient needs continued PT services    Follow Up Recommendations  Home health PT    Barriers to Discharge None Pt will be staying with her mother.     lEquipment Recommendations  Standard walker;Other (comment) (will need bari standard walker)    Recommendations for Other Services OT consult (already on board)   Frequency Min 5X/week    Precautions / Restrictions Precautions Precautions: Fall Restrictions Weight Bearing Restrictions: Yes RLE Weight Bearing: Non weight bearing   Pertinent Vitals/Pain 5/10 pain       Mobility  Bed Mobility Bed Mobility: Supine to Sit Supine to Sit: HOB elevated;4: Min assist Details for Bed Mobility Assistance: Min assist for RLE off of bed with cues for hand placement and for continued deep breathing.  Transfers Transfers: Sit to Stand;Stand to Sit Sit to Stand: 1: +2 Total assist;With upper extremity assist;From bed;From elevated surface Sit to Stand: Patient Percentage: 70% Stand to Sit: 1: +2 Total assist;Without upper extremity assist;With armrests;To chair/3-in-1 Stand to Sit: Patient Percentage: 70% Details for Transfer Assistance: Some assist to rise and +2 for steadying/safety/line management.  cues for hand placement and to maintain NWB.   Ambulation/Gait Ambulation/Gait Assistance: 1: +2 Total  assist Ambulation/Gait: Patient Percentage: 70% Ambulation Distance (Feet): 8 Feet Assistive device: Standard walker Ambulation/Gait Assistance Details: Cues for sequencing/technique with standard walker and to maintain NWB status.   Gait Pattern: Step-to pattern;Decreased step length - left Gait velocity: decreased General Gait Details: Pt very shaky when ambulating and due to L ankle sprain, ambulation is difficult at this time.  Stairs: No Wheelchair Mobility Wheelchair Mobility: No    Exercises     PT Diagnosis: Difficulty walking;Abnormality of gait;Generalized weakness;Acute pain  PT Problem List: Decreased strength;Decreased range of motion;Decreased activity tolerance;Decreased balance;Decreased mobility;Decreased knowledge of use of DME;Pain PT Treatment Interventions: DME instruction;Stair training;Functional mobility training;Therapeutic activities;Therapeutic exercise;Balance training;Patient/family education   PT Goals Acute Rehab PT Goals PT Goal Formulation: With patient Time For Goal Achievement: 03/14/12 Potential to Achieve Goals: Fair Pt will go Supine/Side to Sit: with supervision PT Goal: Supine/Side to Sit - Progress: Goal set today Pt will go Sit to Supine/Side: with supervision PT Goal: Sit to Supine/Side - Progress: Goal set today Pt will go Sit to Stand: with supervision PT Goal: Sit to Stand - Progress: Goal set today Pt will go Stand to Sit: with supervision PT Goal: Stand to Sit - Progress: Goal set today Pt will Ambulate: 16 - 50 feet;with supervision;with least restrictive assistive device PT Goal: Ambulate - Progress: Goal set today Pt will Perform Home Exercise Program: Independently PT Goal: Perform Home Exercise Program - Progress: Goal set today  Visit Information  Last PT Received On: 03/14/12 Assistance Needed: +2 (for safety) PT/OT Co-Evaluation/Treatment: Yes    Subjective Data  Subjective: I don't want to use  an "old lady"  walker. Patient Stated Goal: I want to go to the bathroom.     Prior Functioning  Home Living Lives With: Daughter Available Help at Discharge: Family;Friend(s) (staying with mother) Type of Home: Apartment Home Access: Level entry Entrance Stairs-Rails: Left Home Layout: One level Bathroom Shower/Tub: Engineer, manufacturing systems: Standard Home Adaptive Equipment: Grab bars in shower;Grab bars around toilet Prior Function Level of Independence: Independent Able to Take Stairs?: Yes Driving: Yes Vocation: Holiday representative Communication: No difficulties Dominant Hand: Right    Cognition  Overall Cognitive Status: Appears within functional limits for tasks assessed/performed Arousal/Alertness: Awake/alert Orientation Level: Appears intact for tasks assessed Behavior During Session: [facility] for tasks performed    Extremity/Trunk Assessment Right Lower Extremity Assessment RLE ROM/Strength/Tone: Unable to fully assess;Due to precautions RLE Sensation: WFL - Light Touch RLE Coordination: WFL - gross motor Left Lower Extremity Assessment LLE ROM/Strength/Tone: WFL for tasks assessed LLE Sensation: WFL - Light Touch LLE Coordination: WFL - gross motor Trunk Assessment Trunk Assessment: Normal   Balance    End of Session PT - End of Session Activity Tolerance: Patient limited by pain Patient left: in chair;with call bell/phone within reach;with family/visitor present Nurse Communication: Mobility status   Page, Meribeth Mattes 03/14/2012, 11:49 AM

## 2012-03-14 NOTE — Transfer of Care (Signed)
Immediate Anesthesia Transfer of Care Note  Patient: Carla Hanson  Procedure(s) Performed: Procedure(s) (LRB): OPEN REDUCTION INTERNAL FIXATION (ORIF) ANKLE FRACTURE (Right)  Patient Location: PACU  Anesthesia Type: General  Level of Consciousness: sedated  Airway & Oxygen Therapy: Patient Spontanous Breathing and Patient connected to face mask oxygen  Post-op Assessment: Report given to PACU RN and Post -op Vital signs reviewed and stable  Post vital signs: Reviewed and stable  Complications: No apparent anesthesia complications

## 2012-03-14 NOTE — Progress Notes (Signed)
Subjective: 1 Day Post-Op Procedure(s) (LRB): OPEN REDUCTION INTERNAL FIXATION (ORIF) ANKLE FRACTURE (Right)Mild pain, will start PT  Patient reports pain as mild.    Objective:   VITALS:  Temp:  [97.2 F (36.2 C)-99.4 F (37.4 C)] 98.3 F (36.8 C) (05/11 0928) Pulse Rate:  [76-110] 86  (05/11 0928) Resp:  [13-20] 20  (05/11 0928) BP: (113-158)/(61-77) 146/65 mmHg (05/11 0928) SpO2:  [94 %-100 %] 96 % (05/11 0928)  Neurologically intact ABD soft Neurovascular intact Sensation intact distally Intact pulses distally Dorsiflexion/Plantar flexion intact Incision: dressing C/D/I   LABS  Basename 03/13/12 1640  HGB 12.8  WBC 11.6*  PLT 346    Basename 03/13/12 1640  NA 139  K 3.6  CL 104  CO2 23  BUN 10  CREATININE 0.55  GLUCOSE 98    Basename 03/13/12 1640  LABPT --  INR 0.93     Assessment/Plan: 1 Day Post-Op Procedure(s) (LRB): OPEN REDUCTION INTERNAL FIXATION (ORIF) ANKLE FRACTURE (Right)  Advance diet Up with therapy TKO IVF keep for antibiotics and d/c in am.  Anneth Brunell E 03/14/2012, 10:49 AM

## 2012-03-14 NOTE — Discharge Instructions (Signed)
Keep short leg splint and dressing dry. May use water impervious bag or cast bag and tub chair to shower Tape the top of bag to skin to avoid moisture soaking the dressing on the leg. Call if there is odor or saturation of dressing or worsening pain not controlled with medications. Call if fever greater than 101.5. Use crutches or walker no weight bearing on the ankle fracture leg. Please follow up with an appointment with Dr. Otelia Hanson  2 weeks from the time of surgery. Elevate as often as possible during the first week after surgery gradually increasing the time the leg is dependent or down there after. If swelling recurrs then elevate again. Wheel chair for longer distances. Take anticoagulant daily at 6 PM. Cast or Splint Care Casts and splints support injured limbs and keep bones from moving while they heal.  HOME CARE  Keep the cast or splint uncovered during the drying period.   A plaster cast can take 24 to 48 hours to dry.   A fiberglass cast will dry in less than 1 hour.   Do not rest the cast on anything harder than a pillow for 24 hours.   Do not put weight on your injured limb. Do not put pressure on the cast. Wait for your doctor's approval.   Keep the cast or splint dry.   Cover the cast or splint with a plastic bag during baths or wet weather.   If you have a cast over your chest and belly (trunk), take sponge baths until the cast is taken off.   Keep your cast or splint clean. Wash a dirty cast with a damp cloth.   Do not put any objects under your cast or splint. Do not scratch the skin under the cast with an object.   Do not take out the padding from inside your cast.   Exercise your joints near the cast as told by your doctor.   Raise (elevate) your injured limb on 1 or 2 pillows for the first 1 to 3 days.  GET HELP RIGHT AWAY IF:  Your cast or splint cracks.   Your cast or splint is too tight or too loose.   You itch badly under the cast.   Your cast  gets wet or has a soft spot.   You have a bad smell coming from the cast.   You get an object stuck under the cast.   Your skin around the cast becomes red or raw.   You have new or more pain after the cast is put on.   You have fluid leaking through the cast.   You cannot move your fingers or toes.   Your fingers or toes turn colors or are cool, painful, or puffy (swollen).   You have tingling or lose feeling (numbness) around the injured area.   You have pain or pressure under the cast.   You have trouble breathing or have shortness of breath.   You have chest pain.  MAKE SURE YOU:  Understand these instructions.   Will watch your condition.   Will get help right away if you are not doing well or get worse.  Document Released: 02/20/2011 Document Revised: 10/10/2011 Document Reviewed: 02/20/2011 [facility] Patient Information 2012 [place], [place].Ankle Fracture with Rehab Two bones in the ankle, the shinbone (tibia) and the bone of the outer ankle and lower leg (fibula), are susceptible to being fractured. The fracture may be a complete or an incomplete break of the bone.  It is also common for the ligaments of the ankle joint to be injured at the same time as a fracture. SYMPTOMS   Severe pain in the ankle at the time of injury and/or when trying to move the ankle.   Feeling of popping or tearing in the inner or outer part of the ankle, sometimes as if the ankle joint was temporarily dislocated and popped back into place.   Cracking or other sounds may be heard at the time of fracture.   Severe tenderness in the ankle.   Swelling in the ankle and foot   Blisters around the ankle (uncommon).   Bleeding and bruising (contusion) in the ankle and foot.   Inability to stand or bear weight on the injured foot.   Visible deformity if the fracture is complete and the bone fragments separate enough to distort normal leg contours.   Numbness and coldness in the foot if the  blood supply is impaired.  CAUSES  Bones break when subjected to a force that is greater than the their strength. Most fractures are due to direct trauma, such as being hit with an object or falling.   Fractured may also be caused by indirect stress, such as twisting, pivoting, or violent muscle contraction .  RISK INCREASES WITH:  Sports that require quick changes in direction (football, soccer, or skiing).   Sports that require jumping (basketball, volleyball, distance jumping, or high jumping).   Walking or running on uneven or rough surfaces.   Shoes with inadequate support to prevent the foot and ankle from rolling over when stress occurs.   Bony abnormalities (osteoporosis or bone tumors).   Metabolic disorders, hormone problems, and nutritional deficiencies and disorders.   Poor strength and flexibility   Previous ankle injury.  PREVENTION   Warm up and stretch properly before activity.   Maintain physical fitness:   Leg and ankle strength.   Flexibility and endurance.   Cardiovascular fitness.   Wear properly fitted protective equipment (high-top shoes or when appropriate and ankle bracing, taping, or splinting), especially for the first 12 months after an ankle injury.  PROGNOSIS  If treated properly, ankle fractures typically heal well. RELATED COMPLICATIONS   Failure to heal (nonunion).   Healing in poor position (malunion).   Arrest of normal bone growth in children.   Proneness to repeated ankle injury.   Stiff ankle.   Unstable or arthritic ankle.   Infected skin blisters.   Prolonged healing time if activity is resumed too quickly.  Risks of surgery, including infection, bleeding, injury to nerves (numbness, weakness, paralysis), and need for further surgery. TREATMENT  Treatment initially consists of ice and medication to help reduce pain and inflammation. The joint must be immobilized to allow for healing. If the fracture is where the bones  are out of alignment (displaced), then surgery may be necessary to realign (reduce) them. Surgery usually involves placing pins and screws in the bones to hold them in place while the fracture heals. After surgery the joint is immobilized. Bone growth stimulators may be used to promote bone growth, but this is uncommon, Strengthening and stretching exercises are usually necessary after immobilization in order to regain strength and a full range of motion. These exercises may be completed at home or with a therapist. If pins and screws are placed in the bone, they are not usually removed unless they become a source of pain. MEDICATION   If pain medication is necessary, then nonsteroidal anti-inflammatory medications, such as  aspirin and ibuprofen, or other minor pain relievers, such as acetaminophen, are often recommended.   Do not take pain medication within 7 days before surgery.   Prescription pain relievers may be prescribed if deemed necessary by your caregiver. Use only as directed and only as much as you need.  SEEK MEDICAL CARE IF:   Symptoms get worse or do not improve in 2 weeks despite treatment.   The following occur after immobilization or surgery:   Swelling above or below the fracture site.   Severe, persistent pain.   Blue or gray skin below the fracture site, especially under the nails, or numbness or loss of feeling below the fracture site. Report any of these signs immediately.   New, unexplained symptoms develop (drugs used in treatment may produce side effects).  EXERCISES  RANGE OF MOTION (ROM) AND STRETCHING EXERCISES - Ankle Fracture These exercises may help you when beginning to rehabilitate your injury. Your symptoms may resolve with or without further involvement from your physician, physical therapist or athletic trainer. While completing these exercises, remember:   Restoring tissue flexibility helps normal motion to return to the joints. This allows healthier,  less painful movement and activity.   An effective stretch should be held for at least 30 seconds.   A stretch should never be painful. You should only feel a gentle lengthening or release in the stretched tissue.  RANGE OF MOTION - Dorsi/Plantar Flexion  While sitting with your right / left knee straight, draw the top of your foot upwards by flexing your ankle. Then reverse the motion, pointing your toes downward.   Hold each position for __________ seconds.   After completing your first set of exercises, repeat this exercise with your knee bent.  Repeat __________ times. Complete this exercise __________ times per day.  RANGE OF MOTION- Ankle Plantar Flexion   Sit with your right / left leg crossed over your opposite knee.   Use your opposite hand to pull the top of your foot and toes toward you.   You should feel a gentle stretch on the top of your foot/ankle. Hold this position for __________ seconds.  Repeat __________ times. Complete __________ times per day.  RANGE OF MOTION - Ankle Eversion  Sit with your right / left ankle crossed over your opposite knee.   Grip your foot with your opposite hand, placing your thumb on the top of your foot and your fingers across the bottom of your foot.   Gently push your foot downward with a slight rotation so your littlest toes rise slightly   You should feel a gentle stretch on the inside of your ankle. Hold the stretch for __________ seconds.  Repeat __________ times. Complete this exercise __________ times per day.  RANGE OF MOTION - Ankle Inversion  Sit with your right / left ankle crossed over your opposite knee.   Grip your foot with your opposite hand, placing your thumb on the bottom of your foot and your fingers across the top of your foot.   Gently pull your foot so the smallest toe comes toward you and your thumb pushes the inside of the ball of your foot away from you.   You should feel a gentle stretch on the outside of  your ankle. Hold the stretch for __________ seconds.  Repeat __________ times. Complete this exercise __________ times per day.  RANGE OF MOTION - Ankle Alphabet  Imagine your right / left big toe is a pen.   Keeping  your hip and knee still, write out the entire alphabet with your "pen." Make the letters as large as you can without increasing any discomfort.  Repeat __________ times. Complete this exercise __________ times per day.  RANGE OF MOTION - Ankle Dorsiflexion, Active Assisted   Remove shoes and sit on a chair that is preferably not on a carpeted surface.   Place right / left foot under knee. Extend your opposite leg for support.   Keeping your heel down, slide your right / left foot back toward the chair until you feel a stretch at your ankle or calf. If you do not feel a stretch, slide your bottom forward to the edge of the chair, while still keeping your heel down.   Hold this stretch for __________ seconds.  Repeat __________ times. Complete this stretch __________ times per day.  STRETCH - Gastrocsoleus   Sit with your right / left leg extended. Holding onto both ends of a belt or towel, loop it around the ball of your foot.   Keeping your right / left ankle and foot relaxed and your knee straight, pull your foot and ankle toward you using the belt/towel.   You should feel a gentle stretch behind your calf or knee. Hold this position for __________ seconds.  Repeat __________ times. Complete this stretch __________ times per day.  STRENGTHENING EXERCISES - Ankle Fracture These exercises may help you when beginning to rehabilitate your injury. They may resolve your symptoms with or without further involvement from your physician, physical therapist or athletic trainer. While completing these exercises, remember:   Muscles can gain both the endurance and the strength needed for everyday activities through controlled exercises.   Complete these exercises as instructed by  your physician, physical therapist or athletic trainer. Progress the resistance and repetitions only as guided.   You may experience muscle soreness or fatigue, but the pain or discomfort you are trying to eliminate should never worsen during these exercises. If this pain does worsen, stop and make certain you are following the directions exactly. If the pain is still present after adjustments, discontinue the exercise until you can discuss the trouble with your clinician.  STRENGTH - Dorsiflexors  Secure a rubber exercise band/tubing to a fixed object (table, pole) and loop the other end around your right / left foot.   Sit on the floor facing the fixed object. The band/tubing should be slightly tense when your foot is relaxed.   Slowly draw your foot back toward you using your ankle and toes.   Hold this position for __________ seconds. Slowly release the tension in the band and return your foot to the starting position.  Repeat __________ times. Complete this exercise __________ times per day.  STRENGTH - Plantar-flexors  Sit with your right / left leg extended. Holding onto both ends of a rubber exercise band/tubing, loop it around the ball of your foot. Keep a slight tension in the band.   Slowly push your toes away from you, pointing them downward.   Hold this position for __________ seconds. Return slowly, controlling the tension in the band/tubing.  Repeat __________ times. Complete this exercise __________ times per day.  STRENGTH - Ankle Eversion  Secure one end of a rubber exercise band/tubing to a fixed object (table, pole). Loop the other end around your foot just before your toes.   Place your fists between your knees. This will focus your strengthening at your ankle.   Drawing the band/tubing across your opposite  foot, slowly, pull your little toe out and up. Make sure the band/tubing is positioned to resist the entire motion.   Hold this position for __________ seconds.    Have your muscles resist the band/tubing as it slowly pulls your foot back to the starting position.  Repeat __________ times. Complete this exercise __________ times per day.  STRENGTH - Ankle Inversion  Secure one end of a rubber exercise band/tubing to a fixed object (table, pole). Loop the other end around your foot just before your toes.   Place your fists between your knees. This will focus your strengthening at your ankle.   Slowly, pull your big toe up and in, making sure the band/tubing is positioned to resist the entire motion.   Hold this position for __________ seconds.   Have your muscles resist the band/tubing as it slowly pulls your foot back to the starting position.  Repeat __________ times. Complete this exercises __________ times per day.  STRENGTH - Towel Curls  Sit in a chair positioned on a non-carpeted surface.   Place your foot on a towel, keeping your heel on the floor.   Pull the towel toward your heel by only curling your toes. Keep your heel on the floor.   If instructed by your physician, physical therapist or athletic trainer, add weight at the end of the towel.  Repeat __________ times. Complete this exercise __________ times per day. STRENGTH - Plantar-flexors, Standing   Stand with your feet shoulder width apart. Steady yourself with a wall or table using as little support as needed.   Keeping your weight evenly spread over the width of your feet, rise up on your toes.*   Hold this position for __________ seconds.  Repeat __________ times. Complete this exercise __________ times per day.  *If this is too easy, shift your weight toward your right / left leg until you feel challenged. Ultimately, you may be asked to do this exercise with your right / left foot only. Document Released: 05/22/2005 Document Revised: 10/10/2011 Document Reviewed: 02/02/2009 [facility] Patient Information 2012 [place], [place].

## 2012-03-14 NOTE — Op Note (Signed)
03/13/2012 - 03/14/2012  12:37 AM  PATIENT:  Carla Hanson  46 y.o. female  MRN: [id number]  OPERATIVE REPORT  PRE-OPERATIVE DIAGNOSIS:  fractured right ankle  POST-OPERATIVE DIAGNOSIS:  fractured right ankle  PROCEDURE:  Procedure(s): OPEN REDUCTION INTERNAL FIXATION (ORIF) ANKLE FRACTURE    SURGEON:  Kerrin Champagne, MD     ANESTHESIA:  General, Dr. Shireen Quan supplemented with local 0.5%marcaine 10 cc.    COMPLICATIONS:  None.     TOURNQUET TIME: 61 min @ 300 mmHg PROCEDURE:  The patient was met in the holding area, and the appropriate right foot and short leg splint identified and marked with an "X" and my initials.The patient was then transported to OR and was placed on the operative table in a supine position. The patient was then placed under general anesthesia without difficulty. The patient received appropriate preoperative antibiotic prophylaxis Ancef.  Tourniquet was applied to the operative thigh. Leg was then prepped using sterile conditions and draped using sterile technique. A bump placed under the right buttock for internal rotation. Time-out procedure was called and correct  The was elevated and Esmarch exsanguinated with a thigh  tourniquet elevated to 300 mmHg. incision was then made after timeout over the lateral aspect of the right fibula approximately 8 cm in length based at the expected fibular fracture site approximately 5 cm proximal to the plafond. Incision was undermined proximally using a periosteal elevator in the subperiosteal plane also distally on the superficial portion of the fibula and posterior aspect. Fracture site identified and carefully opened with slight external rotation of the right leg and debridement of interposed muscle with dental pick. Fracture was then carefully aligned and held in place with reducing tenaculum. Longitudinal traction was necessary to reduce the fracture. Fracture held in place then a 3.5 lag screw was placed from anterior  proximal to distal posterior drilling the superficial cortex over the anterior small fragment obliquely the 3.5 drill bit then placing top hat sleeve and drilling the deep cortex with a 2.5 drill and placing the appropriate length screafter measuring for depth.w this provided adequate initial fixation. A 5-hole 3.5 one third semitubular plate was carefully aligned along the lateral posterior aspect of the crest of the fibula 2 holes on either side of the fracture line. Drill holes were then placed proximal to the fracture line springing of the fibula medially. These screw holes were made first drilling with a 2.5 drill entering depth and placing the appropriate screw. Then from the most proximal to distal screw the fracture was compressed by placing drill holes in eccentric within the plate opening and extending distal and then placing the appropriate cortical screws after measuring for depth biting some DCP action with the first 2 screws just distal to the fracture line. A last screw was a 4.0 fully threaded cancellus screw placed in neutral position. Mini C-arm was draped sterilely and brought into the field and the fixation felt to be near anatomic Mini C-arm then used to observe the fixation of the right ankle joint ankle mortise was well reduced with lateral malleolus in excellent position alignment. The medial joint line no longer showed widening. The fibular fracture with some comminution of the fracture site however position alignment appeared to be quite stable. Distal tib-fib syndesmosis appeared reduced. I Lateral incision site was then irrigated with copious amounts of irrigant solution the deep subcutaneous layers were then approximated with interrupted 0 Vicryl sutures more superficial layers with interrupted 2-0 Vicryl sutures and the skin  closed with stainless steel staples. Xeroform gauze 4 x 4s ABDs pad then fixed to the incision sites with sterile Webril. And a well-padded short leg posterior  splint applied with posterior strap and medial and lateral strap. Tourniquet was released total tourniquet time was 61 minutes at 300 mm Hg. All instrument and sponge counts were correct patient was then reactivated extubated returned to her bed and to the recovery room in satisfactory condition.   Nishant Schrecengost E 03/14/2012, 12:37 AM    .

## 2012-03-14 NOTE — Progress Notes (Signed)
Cm spoke with pt concerning MD order for PT/OT. Pt offered choice for [facility]. Pt states has a friend who works for a [employer], would like to refer with friend to inquire which agency she works for before Teacher, music from list. Pt request RW,3N1, shower seat. Pt states needs DME to mother's residence whom will assist with home care at [address], [address], [place] [address], [phone]. [facility] rep Talmadge Coventry notified of DME referral.   Leonie Green [phone]

## 2012-03-14 NOTE — Brief Op Note (Signed)
03/13/2012 - 03/14/2012  12:33 AM  PATIENT:  Carla Hanson  46 y.o. female  PRE-OPERATIVE DIAGNOSIS:  fractured right ankle, lateral malleolus fracture with medial joint line widening.  POST-OPERATIVE DIAGNOSIS:  fractured right ankle, lateral malleolus fx with deltoid sprain. Reduced medial Joint with ORIF of lateral malleolus. Moribid obesity.  PROCEDURE:  Procedure(s) (LRB): OPEN REDUCTION INTERNAL FIXATION (ORIF) ANKLE FRACTURE (Right)Lateral malleolus with single 3.5 lag screw and 5 hole 1/3 semitubular plate.  SURGEON:  Surgeon(s) and Role:    Kerrin Champagne, MD - Primary  ANESTHESIA:   local and general  EBL:  Total I/O In: 1000 [I.V.:1000] Out: 10 [Blood:10]  BLOOD ADMINISTERED:none  DRAINS: none   LOCAL MEDICATIONS USED:  MARCAINE    and Amount: 10 ml  SPECIMEN:  No Specimen  DISPOSITION OF SPECIMEN:  N/A  COUNTS:  YES  TOURNIQUET:   Total Tourniquet Time Documented: Thigh (Right) - 61 minutes  DICTATION: .Reubin Milan Dictation  PLAN OF CARE: Admit to inpatient   PATIENT DISPOSITION:  PACU - hemodynamically stable.   Delay start of Pharmacological VTE agent (>24hrs) due to surgical blood loss or risk of bleeding: no

## 2012-03-15 MED ORDER — METHOCARBAMOL 500 MG PO TABS: 500.0000 mg | ORAL_TABLET | Freq: Three times a day (TID) | ORAL | Status: AC

## 2012-03-15 MED ORDER — ASPIRIN EC 325 MG PO TBEC: 325.0000 mg | DELAYED_RELEASE_TABLET | Freq: Two times a day (BID) | ORAL | Status: AC

## 2012-03-15 MED ORDER — HYDROCODONE-ACETAMINOPHEN 7.5-325 MG PO TABS: 1.0000 | ORAL_TABLET | ORAL | Status: AC | PRN

## 2012-03-15 NOTE — Progress Notes (Signed)
Physical Therapy Treatment Patient Details Name: Carla Hanson MRN: [id number] DOB: February 23, 1966 Today's Date: 03/15/2012 Time: 1103-1130 PT Time Calculation (min): 27 min  PT Assessment / Plan / Recommendation Comments on Treatment Session  Pt continues to be very limited with ambulation, however is making some progress.  Pt wanted to try crutches, therefore attempted, and PT/pt agree that standard walker is safer.      Follow Up Recommendations  Home health PT    Barriers to Discharge        Equipment Recommendations  Other (comment) (bari standard walker, 3in1, and shower bench )    Recommendations for Other Services    Frequency Min 5X/week   Plan Discharge plan remains appropriate    Precautions / Restrictions Precautions Precautions: Fall Restrictions Weight Bearing Restrictions: Yes RLE Weight Bearing: Non weight bearing   Pertinent Vitals/Pain No pain    Mobility  Bed Mobility Details for Bed Mobility Assistance: Pt was already in recliner when PT arrived.  Transfers Transfers: Sit to Stand;Stand to Sit Sit to Stand: 1: +2 Total assist;With armrests;From chair/3-in-1 Sit to Stand: Patient Percentage: 80% Stand to Sit: 1: +2 Total assist;Without upper extremity assist;With armrests;To chair/3-in-1 Stand to Sit: Patient Percentage: 80% Details for Transfer Assistance: Some assist to rise and +2 for steadying/safety/line management.  cues for hand placement and to maintain NWB.   Ambulation/Gait Ambulation/Gait Assistance: 1: +2 Total assist Ambulation/Gait: Patient Percentage: 70% Ambulation Distance (Feet): 12 Feet Assistive device: Standard walker Ambulation/Gait Assistance Details: Continue to provide cues for sequencing/technique with cues for safety of RLE Gait Pattern: Step-to pattern;Decreased step length - left Gait velocity: decreased General Gait Details: Pt very shaky when ambulating and due to L ankle sprain, ambulation is difficult at this time.     Exercises     PT Diagnosis:    PT Problem List:   PT Treatment Interventions:     PT Goals Acute Rehab PT Goals PT Goal Formulation: With patient Time For Goal Achievement: 03/14/12 Potential to Achieve Goals: Fair Pt will go Sit to Stand: with supervision PT Goal: Sit to Stand - Progress: Progressing toward goal Pt will go Stand to Sit: with supervision PT Goal: Stand to Sit - Progress: Progressing toward goal Pt will Ambulate: 16 - 50 feet;with supervision;with least restrictive assistive device PT Goal: Ambulate - Progress: Progressing toward goal Pt will Perform Home Exercise Program: Independently PT Goal: Perform Home Exercise Program - Progress: Progressing toward goal  Visit Information  Last PT Received On: 03/15/12 Assistance Needed: +2    Subjective Data  Subjective: I want to try crutches Patient Stated Goal: I want to go home.    Cognition  Overall Cognitive Status: Appears within functional limits for tasks assessed/performed Arousal/Alertness: Awake/alert Orientation Level: Appears intact for tasks assessed Behavior During Session: [facility] for tasks performed    Balance     End of Session PT - End of Session Activity Tolerance: Patient limited by fatigue Patient left: in chair;with call bell/phone within reach;with family/visitor present Nurse Communication: Mobility status    Page, Meribeth Mattes 03/15/2012, 12:04 PM

## 2012-03-15 NOTE — Progress Notes (Signed)
Subjective: 2 Days Post-Op Procedure(s) (LRB): OPEN REDUCTION INTERNAL FIXATION (ORIF) ANKLE FRACTURE (Right)Awake alert Oriented x 4. Can I go home?  Patient reports pain as mild.    Objective:   VITALS:  Temp:  [98 F (36.7 C)-99.1 F (37.3 C)] 98 F (36.7 C) (05/12 0945) Pulse Rate:  [77-97] 81  (05/12 0945) Resp:  [16-18] 18  (05/12 0945) BP: (117-129)/(47-74) 124/61 mmHg (05/12 0945) SpO2:  [96 %-98 %] 96 % (05/12 0945) Weight:  [132 kg (291 lb 0.1 oz)] 132 kg (291 lb 0.1 oz) (05/11 1748)  Neurologically intact ABD soft Neurovascular intact Sensation intact distally Intact pulses distally Dorsiflexion/Plantar flexion intact Incision: dressing C/D/I   LABS  Basename 03/13/12 1640  HGB 12.8  WBC 11.6*  PLT 346    Basename 03/13/12 1640  NA 139  K 3.6  CL 104  CO2 23  BUN 10  CREATININE 0.55  GLUCOSE 98    Basename 03/13/12 1640  LABPT --  INR 0.93     Assessment/Plan: 2 Days Post-Op Procedure(s) (LRB): OPEN REDUCTION INTERNAL FIXATION (ORIF) ANKLE FRACTURE (Right)  Advance diet Up with therapy D/C IV fluids Discharge home with home health  Carla Hanson E 03/15/2012, 10:07 AM

## 2012-03-15 NOTE — Progress Notes (Signed)
Cm spoke with pt concerning Dc planning. Per pt no need for home PT/OT. MD made aware of pt's decision. PT agrees with pt's decision. PT recommends bariatric standard walker, shower transfer bench, and bariatric 3n1. [facility] to deliver standard walker to room prior to discharge. 3n1 & shower tx bench delivery scheduled to pt's mother's home where pt will reside for assistance with care. As previously noted the address for delivery is [address], [address] [place] [address], [phone].    Leonie Green [phone]

## 2012-03-15 NOTE — Progress Notes (Signed)
Occupational Therapy Treatment Patient Details Name: Carla Hanson MRN: [id number] DOB: December 21, 1965 Today's Date: 03/15/2012 Time: 4098-1191 OT Time Calculation (min): 22 min  OT Assessment / Plan / Recommendation Comments on Treatment Session      Follow Up Recommendations  Home health OT    Barriers to Discharge       Equipment Recommendations  Tub/shower bench;3 in 1 bedside comode;Standard walker;Other (comment) (all bariatric/ extra wide)    Recommendations for Other Services    Frequency Min 2X/week   Plan Discharge plan remains appropriate    Precautions / Restrictions Precautions Precautions: Fall Restrictions Weight Bearing Restrictions: Yes RLE Weight Bearing: Non weight bearing   Pertinent Vitals/Pain     ADL  Lower Body Dressing: Performed;Supervision/safety Where Assessed - Lower Body Dressing: Sitting, chair Equipment Used: Standard walker ADL Comments: Pt instructed in benefits of reacher to prevent falling in the event that pt drops something while alone.  Pt is able to access her L foot from a sitting position without AE.  Instructed pt in use and similar alternatives for toileting tongs.  Also demonstrated use of tub transfer bench and instructed in how to protect R LE from water when showering.  Pt concerned that her girth would not be accommodated with DME.  Called CM to inform her about pt's needs/concerns.    OT Diagnosis:    OT Problem List:   OT Treatment Interventions:     OT Goals ADL Goals Pt Will Perform Lower Body Bathing: with min assist;Sit to stand from chair;Sit to stand from bed ADL Goal: Lower Body Bathing - Progress: Progressing toward goals Pt Will Perform Lower Body Dressing: with min assist;Sit to stand from bed;Sit to stand from chair ADL Goal: Lower Body Dressing - Progress: Progressing toward goals Pt Will Perform Toileting - Hygiene: with min assist;Sit to stand from 3-in-1/toilet ADL Goal: Toileting - Hygiene - Progress:  Progressing toward goals  Visit Information  Last OT Received On: 03/15/12 Assistance Needed: +2    Subjective Data      Prior Functioning       Cognition  Overall Cognitive Status: Appears within functional limits for tasks assessed/performed Arousal/Alertness: Awake/alert Orientation Level: Appears intact for tasks assessed Behavior During Session: [facility] for tasks performed    Mobility      Exercises    Balance    End of Session OT - End of Session Activity Tolerance: Patient tolerated treatment well Patient left: in chair;with call bell/phone within reach   Evern Bio 03/15/2012, 12:28 PM

## 2012-03-15 NOTE — Discharge Summary (Signed)
Physician Discharge Summary  Patient ID: Carla Hanson MRN: [id number] DOB/AGE: 1966-08-01 46 y.o.  Admit date: 03/13/2012 Discharge date:   Admission Diagnoses:  Principal Problem:  *Closed right ankle fracture   Discharge Diagnoses:  Same  Past Medical History  Diagnosis Date  . Depression   . Hypertension   . Lipoma     Abdomen  . Obesity   . Hyperlipidemia   . Tobacco abuse   . LIPOMA 01/20/2008    Surgeries: Procedure(s): OPEN REDUCTION INTERNAL FIXATION (ORIF) ANKLE FRACTURE on 03/13/2012 - 03/14/2012   Consultants:    Discharged Condition: Improved  Hospital Course: [name] is an 46 y.o. female who was admitted 03/13/2012 with a chief complaint of  Chief Complaint  Patient presents with  . Ankle Pain  , and found to have a diagnosis of Closed right ankle fracture.  They were brought to the operating room on 03/13/2012 - 03/14/2012 and underwent the above named procedures.    They were given perioperative antibiotics:  Anti-infectives     Start     Dose/Rate Route Frequency Ordered Stop   03/14/12 0230   ceFAZolin (ANCEF) IVPB 2 g/50 mL premix        2 g 100 mL/hr over 30 Minutes Intravenous Every 6 hours 03/14/12 0217 03/14/12 1435   03/13/12 2215   ceFAZolin (ANCEF) IVPB 2 g/50 mL premix  Status:  Discontinued        2 g 100 mL/hr over 30 Minutes Intravenous  Once 03/13/12 2213 03/14/12 0153   03/13/12 2159   vancomycin (VANCOCIN) 1,500 mg in sodium chloride 0.9 % 500 mL IVPB  Status:  Discontinued        1,500 mg 250 mL/hr over 120 Minutes Intravenous 120 min pre-op 03/13/12 2159 03/13/12 2213        .  They were given sequential compression devices, early ambulation, and chemoprophylaxis for DVT prophylaxis. Underwent PT on POD #1 and #2 and was discharged home On POD#2. Will be maintained on Aspirin 325 mg BID for anti-DVT prophylaxis.  They benefited maximally from their hospital stay and there were no complications.    Recent vital  signs:  Filed Vitals:   03/15/12 0945  BP: 124/61  Pulse: 81  Temp: 98 F (36.7 C)  Resp: 18    Recent laboratory studies:  Results for orders placed during the hospital encounter of 03/13/12  CBC      Component Value Range   WBC 11.6 (*) 4.0 - 10.5 (K/uL)   RBC 4.78  3.87 - 5.11 (MIL/uL)   Hemoglobin 12.8  12.0 - 15.0 (g/dL)   HCT 40.9  81.1 - 91.4 (%)   MCV 80.5  78.0 - 100.0 (fL)   MCH 26.8  26.0 - 34.0 (pg)   MCHC 33.2  30.0 - 36.0 (g/dL)   RDW 78.2 (*) 95.6 - 15.5 (%)   Platelets 346  150 - 400 (K/uL)  BASIC METABOLIC PANEL      Component Value Range   Sodium 139  135 - 145 (mEq/L)   Potassium 3.6  3.5 - 5.1 (mEq/L)   Chloride 104  96 - 112 (mEq/L)   CO2 23  19 - 32 (mEq/L)   Glucose, Bld 98  70 - 99 (mg/dL)   BUN 10  6 - 23 (mg/dL)   Creatinine, Ser 2.13  0.50 - 1.10 (mg/dL)   Calcium 8.6  8.4 - 08.6 (mg/dL)   GFR calc non Af Amer >90  >90 (mL/min)  GFR calc Af Amer >90  >90 (mL/min)  PROTIME-INR      Component Value Range   Prothrombin Time 12.7  11.6 - 15.2 (seconds)   INR 0.93  0.00 - 1.49     Discharge Medications:   Medication List  As of 03/15/2012 10:15 AM   TAKE these medications         aspirin EC 325 MG tablet   Take 1 tablet (325 mg total) by mouth 2 (two) times daily before lunch and supper.      citalopram 20 MG tablet   Commonly known as: CELEXA   TAKE ONE TABLET BY MOUTH DAILY FOR MOOD      HYDROcodone-acetaminophen 7.5-325 MG per tablet   Commonly known as: NORCO   Take 1-2 tablets by mouth every 4 (four) hours as needed.      ibuprofen 200 MG tablet   Commonly known as: ADVIL,MOTRIN   Take 200 mg by mouth every 6 (six) hours as needed. pain      lisinopril-hydrochlorothiazide 20-25 MG per tablet   Commonly known as: PRINZIDE,ZESTORETIC   TAKE 1 TABLET BY MOUTH EVERY DAY FOR BLOOD PRESSURE      methocarbamol 500 MG tablet   Commonly known as: ROBAXIN   Take 1 tablet (500 mg total) by mouth 3 (three) times daily.             Diagnostic Studies: Dg Ankle Complete Right  03/13/2012  *RADIOLOGY REPORT*  Clinical Data: Postreduction  RIGHT ANKLE - COMPLETE 3+ VIEW  Comparison: Prior films same day  Findings: Postreduction there is significant improvement with near anatomic alignment of the right ankle joint.  There is significant improvement in alignment of fibular fracture with minimal step-off of the bony fragment  in distal fibula. Study is limited by casting material artifact.   IMPRESSION:   Postreduction there is significant improvement with near anatomic alignment of the right ankle joint.  There is significant improvement in alignment of fibular fracture with minimal step-off of the bony fragment  in distal fibula.  Original Report Authenticated By: Carla Hanson, M.D.   Dg Ankle Complete Right  03/13/2012  *RADIOLOGY REPORT*  Clinical Data: Ankle pain post fall  RIGHT ANKLE - COMPLETE 3+ VIEW  Comparison: None.  Findings: Two views of the right ankle submitted.  There is complete ankle dislocation with anterior and medial position of the distal tibia and disruption of the ankle mortise.  Displaced fracture of the distal right fibula. Anterior and medial migration of the superior fibula. Distal fibular fragment is still articulated with the talus.  IMPRESSION: There is displaced fracture of distal left fibula.  Ankle dislocation and disruption of the ankle mortise with anterior and medial migration of the tibia from the tibiotalar joint.  Original Report Authenticated By: Carla Hanson, M.D.   Dg Ankle Right Port  03/14/2012  *RADIOLOGY REPORT*  Clinical Data: Postop right ankle ORIF  PORTABLE RIGHT ANKLE - 2 VIEW  Comparison: 03/13/2012  Findings: Interval postoperative changes with plate and screw fixation of a fracture of the lateral malleolus of the right ankle. Skin clips present.  Overlying cast material obscures bony detail. Alignment and position of the fracture fragments appears to be near anatomic.  Screw heads are  not flushed with the plate.  IMPRESSION: Interval plate screw fixation of distal fibular fracture with near anatomic alignment and position noted.  Original Report Authenticated By: Carla Hanson, M.D.    Disposition: 01-Home or Self Care  Discharge Orders  Future Orders Please Complete By Expires   Diet - low sodium heart healthy      Call MD / Call 911      Comments:   If you experience chest pain or shortness of breath, CALL 911 and be transported to the hospital emergency room.  If you develope a fever above 101 F, pus (white drainage) or increased drainage or redness at the wound, or calf pain, call your surgeon's office.   Constipation Prevention      Comments:   Drink plenty of fluids.  Prune juice may be helpful.  You may use a stool softener, such as Colace (over the counter) 100 mg twice a day.  Use MiraLax (over the counter) for constipation as needed.   Increase activity slowly as tolerated      Weight Bearing as taught in Physical Therapy      Comments:   Use a walker or crutches as instructed.   Discharge instructions      Comments:   Keep short leg splint and dressing dry. May use water impervious bag or cast bag and tub chair to shower Tape the top of bag to skin to avoid moisture soaking the dressing on the leg. Call if there is odor or saturation of dressing or worsening pain not controlled with medications. Call if fever greater than 101.5. Use crutches or walker no weight bearing on the ankle fracture leg. Please follow up with an appointment with Dr. Otelia Sergeant  2 weeks from the time of surgery. Elevate as often as possible during the first week after surgery gradually increasing the time the leg is dependent or down there after. If swelling recurrs then elevate again. Wheel chair for longer distances. Take anticoagulant daily at 6 PM.   Driving restrictions      Comments:   No driving for 8 weeks   Lifting restrictions      Comments:   No lifting for 6  weeks      Follow-up Information    Follow up with Karalina Tift E, MD. Schedule an appointment as soon as possible for a visit in 2 weeks.   Contact information:   [employer] [address] [place] [address] [phone]           Signed: Kerrin Champagne 03/15/2012, 10:15 AM

## 2012-03-16 NOTE — Progress Notes (Signed)
Utilization review completed.  

## 2012-03-17 ENCOUNTER — Other Ambulatory Visit: Payer: Self-pay | Admitting: Family Medicine

## 2012-03-19 ENCOUNTER — Telehealth: Payer: Self-pay | Admitting: Family Medicine

## 2012-03-19 ENCOUNTER — Encounter (HOSPITAL_COMMUNITY): Payer: Self-pay | Admitting: Specialist

## 2012-03-19 NOTE — Telephone Encounter (Signed)
Pt has a broken ankle and cannot leave her house.  Needs Lisinopril called until she can get in.

## 2012-03-19 NOTE — Telephone Encounter (Signed)
Patient notified that Rx was sent in on 05/14. Pharmacy verified they have Rx also.

## 2012-05-04 ENCOUNTER — Emergency Department (HOSPITAL_COMMUNITY): Payer: Medicaid Other

## 2012-05-04 ENCOUNTER — Encounter (HOSPITAL_COMMUNITY): Payer: Self-pay | Admitting: Emergency Medicine

## 2012-05-04 ENCOUNTER — Inpatient Hospital Stay (HOSPITAL_COMMUNITY)
Admission: EM | Admit: 2012-05-04 | Discharge: 2012-05-05 | DRG: 176 | Disposition: A | Discharge status: Home / Self Care | Payer: Medicaid Other | Attending: Family Medicine | Admitting: Family Medicine

## 2012-05-04 DIAGNOSIS — F3289 Other specified depressive episodes: Secondary | ICD-10-CM | POA: Diagnosis present

## 2012-05-04 DIAGNOSIS — I2699 Other pulmonary embolism without acute cor pulmonale: Principal | ICD-10-CM | POA: Diagnosis present

## 2012-05-04 DIAGNOSIS — Z6841 Body Mass Index (BMI) 40.0 and over, adult: Secondary | ICD-10-CM

## 2012-05-04 DIAGNOSIS — E785 Hyperlipidemia, unspecified: Secondary | ICD-10-CM

## 2012-05-04 DIAGNOSIS — Z7982 Long term (current) use of aspirin: Secondary | ICD-10-CM

## 2012-05-04 DIAGNOSIS — I10 Essential (primary) hypertension: Secondary | ICD-10-CM

## 2012-05-04 DIAGNOSIS — Z79899 Other long term (current) drug therapy: Secondary | ICD-10-CM

## 2012-05-04 DIAGNOSIS — E669 Obesity, unspecified: Secondary | ICD-10-CM | POA: Diagnosis present

## 2012-05-04 DIAGNOSIS — F329 Major depressive disorder, single episode, unspecified: Secondary | ICD-10-CM | POA: Diagnosis present

## 2012-05-04 DIAGNOSIS — F17200 Nicotine dependence, unspecified, uncomplicated: Secondary | ICD-10-CM

## 2012-05-04 DIAGNOSIS — Z86711 Personal history of pulmonary embolism: Secondary | ICD-10-CM | POA: Diagnosis present

## 2012-05-04 HISTORY — DX: Anxiety disorder, unspecified: F41.9

## 2012-05-04 LAB — BASIC METABOLIC PANEL
BUN: 6 mg/dL (ref 6–23)
CO2: 22 mEq/L (ref 19–32)
Chloride: 101 mEq/L (ref 96–112)
GFR calc Af Amer: >90 mL/min (ref >90)
Potassium: 3.5 mEq/L (ref 3.5–5.1)

## 2012-05-04 LAB — PROTIME-INR
INR: 1.07 (ref 0.00–1.49)
Prothrombin Time: 14.1 seconds (ref 11.6–15.2)

## 2012-05-04 LAB — CBC WITH DIFFERENTIAL/PLATELET
Basophils Relative: 0 % (ref 0–1)
Hemoglobin: 11.3 g/dL — ABNORMAL LOW (ref 12.0–15.0)
Lymphocytes Relative: 13 % (ref 12–46)
MCHC: 32.7 g/dL (ref 30.0–36.0)
Monocytes Relative: 6 % (ref 3–12)
Neutro Abs: 9.8 10*3/uL — ABNORMAL HIGH (ref 1.7–7.7)
Neutrophils Relative %: 80 % — ABNORMAL HIGH (ref 43–77)
RBC: 4.33 MIL/uL (ref 3.87–5.11)
WBC: 12.2 10*3/uL — ABNORMAL HIGH (ref 4.0–10.5)

## 2012-05-04 LAB — POCT I-STAT TROPONIN I: Troponin i, poc: 0 ng/mL (ref 0.00–0.08)

## 2012-05-04 MED ORDER — IPRATROPIUM BROMIDE 0.02 % IN SOLN
RESPIRATORY_TRACT | Status: AC
  Filled 2012-05-04: qty 2.5

## 2012-05-04 MED ORDER — HEPARIN (PORCINE) IN NACL 100-0.45 UNIT/ML-% IJ SOLN
18.0000 [IU]/kg/h | INTRAMUSCULAR | Status: DC
  Administered 2012-05-04: 18 [IU]/kg/h via INTRAVENOUS
  Filled 2012-05-04 (×2): qty 250

## 2012-05-04 MED ORDER — IOHEXOL 350 MG/ML SOLN
100.0000 mL | Freq: Once | INTRAVENOUS | Status: AC | PRN
  Administered 2012-05-04: 87 mL via INTRAVENOUS

## 2012-05-04 MED ORDER — SODIUM CHLORIDE 0.9 % IV SOLN
INTRAVENOUS | Status: AC
  Administered 2012-05-04: 23:00:00 via INTRAVENOUS

## 2012-05-04 MED ORDER — SODIUM CHLORIDE 0.9 % IV BOLUS (SEPSIS)
1000.0000 mL | Freq: Once | INTRAVENOUS | Status: AC
  Administered 2012-05-04: 1000 mL via INTRAVENOUS

## 2012-05-04 MED ORDER — HEPARIN BOLUS VIA INFUSION
5000.0000 [IU] | Freq: Once | INTRAVENOUS | Status: AC
  Administered 2012-05-04: 5000 [IU] via INTRAVENOUS

## 2012-05-04 NOTE — ED Notes (Signed)
Pt began to have some difficulty breathing and cough after visiting friend's house.  No hx of respiratory disease, although family has hx of respiratory diseases.   Pt was coughing up clear sputum until she arrived here and began to have yellow sputum.  At 1000 today she called EMS who gave a breathing tx, felt better afterwords, but later this afternoon began to feel unwell again.  Upon EMS arrival pt had labored breathing, and was almost in tripod position.  EMS gave 2 breathing txs.  Pt began to have labored breathing after trying to ambulate to stretcher.  Pt has expiratory wheezing, esp in lower lobes.  No distress at present, o2 sat 94% on RA.

## 2012-05-04 NOTE — ED Provider Notes (Signed)
History     CSN: [id number]  Arrival date & time 05/04/12  1646   First MD Initiated Contact with Patient 05/04/12 1712      Chief Complaint  Patient presents with  . Shortness of Breath    (Consider location/radiation/quality/duration/timing/severity/associated sxs/prior treatment) HPI  Past Medical History  Diagnosis Date  . Depression   . Hypertension   . Lipoma     Abdomen  . Obesity   . Hyperlipidemia   . Tobacco abuse   . LIPOMA 01/20/2008    Past Surgical History  Procedure Date  . Lipoma removed 03/2011  . Cesarean section   . Orif ankle fracture 03/13/2012    Procedure: OPEN REDUCTION INTERNAL FIXATION (ORIF) ANKLE FRACTURE;  Surgeon: Kerrin Champagne, MD;  Location: [facility];  Service: Orthopedics;  Laterality: Right;    Family History  Problem Relation Age of Onset  . Asthma Father     History  Substance Use Topics  . Smoking status: Current Everyday Smoker -- 0.3 packs/day for 25 years    Types: Cigarettes  . Smokeless tobacco: Never Used   Comment: in process of quitting  . Alcohol Use: Yes     2 beer / week    OB History    Grav Para Term Preterm Abortions TAB SAB Ect Mult Living                  Review of Systems  Allergies  Review of patient's allergies indicates no known allergies.  Home Medications   Current Outpatient Rx  Name Route Sig Dispense Refill  . ASPIRIN 325 MG PO TABS Oral Take 325 mg by mouth daily.    . GUAIFENESIN 100 MG/5ML PO LIQD Oral Take 200 mg by mouth 3 (three) times daily as needed.    Marland Kitchen HYDROCODONE-ACETAMINOPHEN 5-325 MG PO TABS Oral Take 1 tablet by mouth every 4 (four) hours as needed. pain    . LISINOPRIL-HYDROCHLOROTHIAZIDE 20-25 MG PO TABS  TAKE 1 TABLET BY MOUTH EVERY DAY FOR BLOOD PRESSURE 30 tablet 1    Needs MD appt    BP 148/74  Pulse 110  Temp 99 F (37.2 C) (Oral)  Resp 17  Ht 5\' 4"  (1.626 m)  Wt 289 lb (131.09 kg)  BMI 49.61 kg/m2  SpO2 100%  LMP 04/08/2012  Physical Exam  ED Course    Procedures (including critical care time)  Labs Reviewed  CBC WITH DIFFERENTIAL - Abnormal; Notable for the following:    WBC 12.2 (*)     Hemoglobin 11.3 (*)     HCT 34.6 (*)     Neutrophils Relative 80 (*)     Neutro Abs 9.8 (*)     All other components within normal limits  BASIC METABOLIC PANEL - Abnormal; Notable for the following:    Glucose, Bld 117 (*)     Creatinine, Ser 0.47 (*)     All other components within normal limits  POCT I-STAT TROPONIN I   Dg Chest 2 View  05/04/2012  *RADIOLOGY REPORT*  Clinical Data: Dyspnea  CHEST - 2 VIEW  Comparison: 04/03/2011  Findings: The heart size and mediastinal contours are within normal limits.  Both lungs are clear.  The visualized skeletal structures are unremarkable.  IMPRESSION: Negative exam.  Original Report Authenticated By: Rosealee Albee, M.D.   Ct Angio Chest W/cm &/or Wo Cm  05/04/2012  *RADIOLOGY REPORT*  Clinical Data: Shortness of breath, tachycardia, cough.  CT ANGIOGRAPHY CHEST  Technique:  Multidetector CT imaging of the chest using the standard protocol during bolus administration of intravenous contrast. Multiplanar reconstructed images including MIPs were obtained and reviewed to evaluate the vascular anatomy.  Contrast: 87mL OMNIPAQUE IOHEXOL 350 MG/ML SOLN  Comparison: None.  Findings: Study is somewhat suboptimal due to respiratory motion. There are multiple pulmonary emboli however still visualized within the both right upper lobe and right lower lobe pulmonary arterial branches.  Possible smaller emboli within the left lower lobe, not as well visualized due to respiratory motion.  Heart is normal size.  Aorta is normal caliber. No mediastinal, hilar, or axillary adenopathy.  Small scattered mediastinal lymph nodes, none pathologically enlarged. Visualized thyroid and chest wall soft tissues unremarkable.  Linear areas of atelectasis in the lower lobes bilaterally.  No effusions.  Imaging into the upper abdomen shows no  acute findings.  IMPRESSION: Bilateral pulmonary emboli, most pronounced in the right lower lobe.  Bibasilar atelectasis.  These results were called to Scarlette Calico, physician assistant in the [facility] emergency room at the time of interpretation.  Original Report Authenticated By: Cyndie Chime, M.D.     Pulmonary Emboli   MDM  Patient here with shortness of breath for the past 2 days - noted to have PE on CTA - plan to transfer to [facility] where PCP is.  Please see C. Mayford Knife, PA-C note for HPI, ROS and PE.        Carla Hanson [place], [place] 05/04/12 2243

## 2012-05-04 NOTE — Progress Notes (Signed)
ANTICOAGULATION CONSULT NOTE - Initial Consult  Pharmacy Consult for heparin Indication: pulmonary embolus  No Known Allergies  Patient Measurements: Height: 5\' 4"  (162.6 cm) Weight: 289 lb (131.09 kg) IBW/kg (Calculated) : 54.7  Heparin Dosing Weight:   Vital Signs: Temp: 99 F (37.2 C) (07/01 1703) Temp src: Oral (07/01 1703) BP: 148/74 mmHg (07/01 2006) Pulse Rate: 110  (07/01 2006)  Labs:  [facility] 05/04/12 1826  HGB 11.3*  HCT 34.6*  PLT 311  APTT --  LABPROT --  INR --  HEPARINUNFRC --  CREATININE 0.47*  CKTOTAL --  CKMB --  TROPONINI --    Estimated Creatinine Clearance: 119.6 ml/min (by C-G formula based on Cr of 0.47).   Medical History: Past Medical History  Diagnosis Date  . Depression   . Hypertension   . Lipoma     Abdomen  . Obesity   . Hyperlipidemia   . Tobacco abuse   . LIPOMA 01/20/2008    Medications:   (Not in a hospital admission) Infusions:    . heparin      Assessment: Patient with PE and SOB.  Heparin to be started in ED.  Awaiting baseline labs, told by ED staff will draw promptly  Goal of Therapy:  Heparin level 0.3-0.7 units/ml Monitor platelets by anticoagulation protocol: Yes   Plan:  Heparin bolus 5000 units iv x1, then 1550 units/hr Daily CBC/heparin level.  Check heparin level in ~6hours  Aleene Davidson [place] 05/04/2012,11:15 PM

## 2012-05-04 NOTE — ED Provider Notes (Signed)
Medical screening examination/treatment/procedure(s) were conducted as a shared visit with non-physician practitioner(s) and myself.  I personally evaluated the patient during the encounter  Patient has bilateral pulmonary emboli on her CT scan. I discussed the findings with her. She remained mildly tachycardic but otherwise does not appear to be in any significant distress. IV heparin has been ordered. The patient be transferred to [facility] as she goes to the family practice clinic for treatment  Celene Kras, MD 05/04/12 2238

## 2012-05-04 NOTE — ED Notes (Signed)
[id number]<AV> Expected date:<BR> Expected time: 4:40 PM<BR> Means of arrival:<BR> Comments:<BR> M61- 46yoF-sob, wheezing

## 2012-05-04 NOTE — ED Provider Notes (Signed)
History     CSN: [id number]  Arrival date & time 05/04/12  1646   First MD Initiated Contact with Patient 05/04/12 1712      Chief Complaint  Patient presents with  . Shortness of Breath    (Consider location/radiation/quality/duration/timing/severity/associated sxs/prior treatment) HPI Comments: Pt presents with 2 days of shortness of breath and cough productive of yellow sputum. She reports that this began suddenly yesterday. She denies any associated chest pain, palpitations, diaphoresis, nausea/vomiting. Has never had anything like this before. She has no known hx of asthma or pulmonary diagnoses. SOB gets worse with lying flat, improves with sitting upright. Pt called EMS this morning and was given a neb; she felt better with this and declined transport. Called EMS this afternoon again when she began to feel unwell again - was found to be in tripod position. Given 2 nebs en route.   Pt has had recent ORIF of R ankle, about 6 weeks ago. Pt reports recovery has been uneventful. Denies any leg swelling, erythema, pain. She is not currently on exogenous estrogen, has no hx CA; reports smoking 1/3 pack cigarettes/day.  Patient is a 46 y.o. female presenting with shortness of breath. The history is provided by the patient.  Shortness of Breath  The current episode started yesterday. The onset was sudden. The problem occurs continuously. The problem has been unchanged. The problem is moderate. Nothing relieves the symptoms. Associated symptoms include cough and shortness of breath. Pertinent negatives include no chest pain, no fever, no sore throat and no wheezing.    Past Medical History  Diagnosis Date  . Depression   . Hypertension   . Lipoma     Abdomen  . Obesity   . Hyperlipidemia   . Tobacco abuse   . LIPOMA 01/20/2008    Past Surgical History  Procedure Date  . Lipoma removed 03/2011  . Cesarean section   . Orif ankle fracture 03/13/2012    Procedure: OPEN REDUCTION  INTERNAL FIXATION (ORIF) ANKLE FRACTURE;  Surgeon: Kerrin Champagne, MD;  Location: [facility];  Service: Orthopedics;  Laterality: Right;    Family History  Problem Relation Age of Onset  . Asthma Father     History  Substance Use Topics  . Smoking status: Current Everyday Smoker -- 0.3 packs/day for 25 years    Types: Cigarettes  . Smokeless tobacco: Never Used   Comment: in process of quitting  . Alcohol Use: Yes     2 beer / week    OB History    Grav Para Term Preterm Abortions TAB SAB Ect Mult Living                  Review of Systems  Constitutional: Negative for fever and chills.  HENT: Negative for congestion and sore throat.   Respiratory: Positive for cough and shortness of breath. Negative for choking and wheezing.   Cardiovascular: Negative for chest pain, palpitations and leg swelling.  Gastrointestinal: Negative for nausea, vomiting and abdominal pain.  Skin: Negative for color change and rash.  Neurological: Negative for dizziness and weakness.  All other systems reviewed and are negative.    Allergies  Review of patient's allergies indicates no known allergies.  Home Medications   Current Outpatient Rx  Name Route Sig Dispense Refill  . ASPIRIN 325 MG PO TABS Oral Take 325 mg by mouth daily.    . GUAIFENESIN 100 MG/5ML PO LIQD Oral Take 200 mg by mouth 3 (three) times daily as  needed.    Marland Kitchen HYDROCODONE-ACETAMINOPHEN 5-325 MG PO TABS Oral Take 1 tablet by mouth every 4 (four) hours as needed. pain    . LISINOPRIL-HYDROCHLOROTHIAZIDE 20-25 MG PO TABS  TAKE 1 TABLET BY MOUTH EVERY DAY FOR BLOOD PRESSURE 30 tablet 1    Needs MD appt    BP 148/74  Pulse 110  Temp 99 F (37.2 C) (Oral)  Resp 17  SpO2 100%  LMP 04/08/2012  Physical Exam  Nursing note and vitals reviewed. Constitutional: She appears well-developed and well-nourished. No distress.  HENT:  Head: Normocephalic and atraumatic.  Eyes:       Normal appearance  Neck: Normal range of motion.    Cardiovascular: Regular rhythm and normal heart sounds.  Exam reveals no gallop and no friction rub.   No murmur heard.      tachycardia  Pulmonary/Chest: Effort normal and breath sounds normal. She exhibits no tenderness.       Pt sitting upright on stretcher Rhonchi and prolonged expiration, more prominent over L lung field  Abdominal: Soft. Bowel sounds are normal. There is no tenderness. There is no rebound and no guarding.  Musculoskeletal: Normal range of motion.       Pt in cam walker, no gross edema or tenderness to palpation to RLE  Neurological: She is alert.  Skin: Skin is warm and dry. She is not diaphoretic.  Psychiatric: She has a normal mood and affect.    ED Course  Procedures (including critical care time)   Date: 05/04/2012  Rate: 122  Rhythm: sinus tachycardia  QRS Axis: normal  Intervals: normal  ST/T Wave abnormalities: borderline T changes diffusely  Conduction Disutrbances:none  Narrative Interpretation:   Old EKG Reviewed: as compared with May 2012 rate increased  Labs Reviewed  CBC WITH DIFFERENTIAL - Abnormal; Notable for the following:    WBC 12.2 (*)     Hemoglobin 11.3 (*)     HCT 34.6 (*)     Neutrophils Relative 80 (*)     Neutro Abs 9.8 (*)     All other components within normal limits  BASIC METABOLIC PANEL - Abnormal; Notable for the following:    Glucose, Bld 117 (*)     Creatinine, Ser 0.47 (*)     All other components within normal limits  POCT I-STAT TROPONIN I   Dg Chest 2 View  05/04/2012  *RADIOLOGY REPORT*  Clinical Data: Dyspnea  CHEST - 2 VIEW  Comparison: 04/03/2011  Findings: The heart size and mediastinal contours are within normal limits.  Both lungs are clear.  The visualized skeletal structures are unremarkable.  IMPRESSION: Negative exam.  Original Report Authenticated By: Rosealee Albee, M.D.     No diagnosis found.    MDM  Pt with no hx bronchitis or asthma presents with cough and dyspnea x 2 days. She is  tachycardic on my exam to 120s; ECG with sinus tach. This may be an after-effect of albuterol, but pt has had recent orthopedic procedure to LEs, which is concerning. CXR nl. Mild leukocytosis of 12k. Pt awaiting CTA chest to r/o PE. Signed out to Dr. Donia Ast, PA-C.        Grant Fontana, PA-C 05/04/12 2116

## 2012-05-05 ENCOUNTER — Encounter (HOSPITAL_COMMUNITY): Payer: Self-pay | Admitting: *Deleted

## 2012-05-05 DIAGNOSIS — I2699 Other pulmonary embolism without acute cor pulmonale: Secondary | ICD-10-CM

## 2012-05-05 DIAGNOSIS — Z86711 Personal history of pulmonary embolism: Secondary | ICD-10-CM | POA: Diagnosis present

## 2012-05-05 LAB — CBC
MCH: 26.1 pg (ref 26.0–34.0)
MCHC: 32.1 g/dL (ref 30.0–36.0)
MCV: 81.2 fL (ref 78.0–100.0)
Platelets: 373 10*3/uL (ref 150–400)
RBC: 4.41 MIL/uL (ref 3.87–5.11)
RDW: 15.5 % (ref 11.5–15.5)

## 2012-05-05 LAB — LIPID PANEL
LDL Cholesterol: 122 mg/dL — ABNORMAL HIGH (ref 0–99)
Total CHOL/HDL Ratio: 4.2 RATIO
Triglycerides: 102 mg/dL (ref <150)
VLDL: 20 mg/dL (ref 0–40)

## 2012-05-05 MED ORDER — RIVAROXABAN 15 MG PO TABS
15.0000 mg | ORAL_TABLET | Freq: Two times a day (BID) | ORAL | Status: DC
  Administered 2012-05-05: 15 mg via ORAL
  Filled 2012-05-05 (×3): qty 1

## 2012-05-05 MED ORDER — ALBUTEROL SULFATE (5 MG/ML) 0.5% IN NEBU
2.5000 mg | INHALATION_SOLUTION | RESPIRATORY_TRACT | Status: DC | PRN
  Administered 2012-05-05: 2.5 mg via RESPIRATORY_TRACT
  Filled 2012-05-05: qty 0.5

## 2012-05-05 MED ORDER — RIVAROXABAN 20 MG PO TABS: 20.0000 mg | ORAL_TABLET | Freq: Every day | ORAL | Status: DC

## 2012-05-05 MED ORDER — ALBUTEROL SULFATE HFA 108 (90 BASE) MCG/ACT IN AERS: 2.0000 | INHALATION_SPRAY | RESPIRATORY_TRACT | Status: DC | PRN

## 2012-05-05 MED ORDER — ALBUTEROL SULFATE (5 MG/ML) 0.5% IN NEBU
INHALATION_SOLUTION | RESPIRATORY_TRACT | Status: AC
  Administered 2012-05-05: 01:00:00
  Filled 2012-05-05: qty 1

## 2012-05-05 MED ORDER — NICOTINE 7 MG/24HR TD PT24
7.0000 mg | MEDICATED_PATCH | Freq: Every day | TRANSDERMAL | Status: DC
  Filled 2012-05-05: qty 1

## 2012-05-05 MED ORDER — LISINOPRIL 20 MG PO TABS
20.0000 mg | ORAL_TABLET | Freq: Every day | ORAL | Status: DC
  Administered 2012-05-05: 20 mg via ORAL
  Filled 2012-05-05: qty 1

## 2012-05-05 MED ORDER — RIVAROXABAN 15 MG PO TABS
15.0000 mg | ORAL_TABLET | Freq: Two times a day (BID) | ORAL | Status: DC
  Filled 2012-05-05 (×2): qty 1

## 2012-05-05 MED ORDER — LISINOPRIL-HYDROCHLOROTHIAZIDE 20-25 MG PO TABS: 1.0000 | ORAL_TABLET | Freq: Every day | ORAL | Status: DC

## 2012-05-05 MED ORDER — BIOTENE DRY MOUTH MT LIQD
15.0000 mL | Freq: Two times a day (BID) | OROMUCOSAL | Status: DC
  Administered 2012-05-05: 15 mL via OROMUCOSAL

## 2012-05-05 MED ORDER — NICOTINE 7 MG/24HR TD PT24: 1.0000 | MEDICATED_PATCH | Freq: Every day | TRANSDERMAL | Status: DC

## 2012-05-05 MED ORDER — ALBUTEROL SULFATE HFA 108 (90 BASE) MCG/ACT IN AERS
2.0000 | INHALATION_SPRAY | RESPIRATORY_TRACT | Status: DC | PRN
  Administered 2012-05-05: 2 via RESPIRATORY_TRACT
  Filled 2012-05-05: qty 6.7

## 2012-05-05 MED ORDER — SODIUM CHLORIDE 0.9 % IJ SOLN
3.0000 mL | Freq: Two times a day (BID) | INTRAMUSCULAR | Status: DC
  Administered 2012-05-05: 3 mL via INTRAVENOUS

## 2012-05-05 MED ORDER — HYDROCHLOROTHIAZIDE 25 MG PO TABS
25.0000 mg | ORAL_TABLET | Freq: Every day | ORAL | Status: DC
  Administered 2012-05-05: 25 mg via ORAL
  Filled 2012-05-05: qty 1

## 2012-05-05 MED ORDER — RIVAROXABAN 15 MG PO TABS: 15.0000 mg | ORAL_TABLET | Freq: Two times a day (BID) | ORAL | Status: DC

## 2012-05-05 MED ORDER — HYDROCODONE-ACETAMINOPHEN 5-325 MG PO TABS: 1.0000 | ORAL_TABLET | ORAL | Status: DC | PRN

## 2012-05-05 NOTE — Care Management Note (Unsigned)
    Page 1 of 1   05/05/2012     11:11:48 AM   CARE MANAGEMENT NOTE 05/05/2012  Patient:  Carla Hanson, Carla Hanson   Account Number:  [id number]  Date Initiated:  05/05/2012  Documentation initiated by:  SIMMONS,Dawaun Brancato  Subjective/Objective Assessment:   ADMITTED WITH BILATERAL PE'S; LIVES AT HOME WITH MOTHER; WAS IPTA.     Action/Plan:   DISCHARGE PLANNING   Anticipated DC Date:  05/06/2012   Anticipated DC Plan:  HOME/SELF CARE      DC Planning Services  CM consult      Choice offered to / List presented to:             Status of service:  In process, will continue to follow Medicare Important Message given?   (If response is "NO", the following Medicare IM given date fields will be blank) Date Medicare IM given:   Date Additional Medicare IM given:    Discharge Disposition:    Per UR Regulation:  Reviewed for med. necessity/level of care/duration of stay  If discussed at Long Length of Stay Meetings, dates discussed:    Comments:  05/05/12  1111  Cayce Paschal SIMMONS RN, BSN [phone] NCM WILL FOLLOW.

## 2012-05-05 NOTE — H&P (Signed)
Family Medicine Teaching [facility] Admission History and Physical Service Pager: [phone]  Patient name: Carla Hanson Medical record number: [id number] Date of birth: 1966-06-20 Age: 46 y.o. Gender: female  Primary Care Provider: Ellin Mayhew, MD  Chief Complaint: Shortness of breath and cough  Assessment and Plan: Carla Hanson is a 46 y.o. year old female presenting with SOB/cough and b/l PEs 1. PE's: PE would fall under the category of provoked due to recent surgery. Accordingly I will not initiate any type of hypercoagulable state workup. Patient's PEs will be treated and she will be discharged home on therapeutic anticoagulation. Smoking cessation will be highly encouraged. 1. Heparin drip per pharm 2. Discuss coumadin vs xarelto 3. B/L LE doplers to find source 4. Watch bp, O2 sats, hr.  No concerning findings at this time 2. HTN: Will continue OP meds.  No problems with hypotension at this tims. 3. HLD: Not on any meds at this time.  Will check FLP this AM as patient is here and is due for one in the OP setting. 4. Tobacco abuse: A brother patient with nicotine patch while in the hospital. She seems quite motivated to stop smoking at this time. 5. FEN/GI: Regular diet 6. Prophylaxis: Therapeutic heparin 7. Disposition: Tele floor, inpatient status 8. Code Status: Full  History of Present Illness: Carla Hanson is a 46 y.o. year old female presenting with shortness of breath and cough of 2 days' duration.  Patient reports that she awoke yesterday with a relatively nonproductive cough. This cough persisted throughout the day and she began to develop problems with shortness of breath by yesterday evening. She had a hard time sleeping last night secondary to shortness of breath and presented to the emergency department today due to her shortness of breath. She admits to some chest pain with deep rest. Pain is located on the right and is nonradiating. Patient also is to a  nonproductive cough. Denies, this is. Denies upper respiratory tract infection symptoms. Denies fevers or chills. Patient is a smoker who recently underwent surgery (approximately 6 weeks ago) for an ankle fracture. Patient is not on any anticoagulation. Patient takes minimal medications at home.  No recent travel or long immobilizations.  No unusual swelling of legs or pain.  Patient Active Problem List  Diagnosis  . HYPERLIPIDEMIA  . OBESITY, NOS  . DEPRESSION, MAJOR, RECURRENT  . TOBACCO ABUSE  . HYPERTENSION, BENIGN SYSTEMIC  . Breast nodule  . Painful menstrual periods  . Closed right ankle fracture  . Pulmonary embolism   Past Medical History: Past Medical History  Diagnosis Date  . Depression   . Hypertension   . Lipoma     Abdomen  . Obesity   . Hyperlipidemia   . Tobacco abuse   . LIPOMA 01/20/2008   Past Surgical History: Past Surgical History  Procedure Date  . Lipoma removed 03/2011  . Cesarean section   . Orif ankle fracture 03/13/2012    Procedure: OPEN REDUCTION INTERNAL FIXATION (ORIF) ANKLE FRACTURE;  Surgeon: Kerrin Champagne, MD;  Location: [facility];  Service: Orthopedics;  Laterality: Right;   Social History: History  Substance Use Topics  . Smoking status: Current Everyday Smoker -- 0.3 packs/day for 25 years    Types: Cigarettes  . Smokeless tobacco: Never Used   Comment: in process of quitting  . Alcohol Use: Yes     2 beer / week   For any additional social history documentation, please refer to relevant sections of  EMR.  Family History: Family History  Problem Relation Age of Onset  . Asthma Father    Allergies: No Known Allergies No current facility-administered medications on file prior to encounter.   Current Outpatient Prescriptions on File Prior to Encounter  Medication Sig Dispense Refill  . lisinopril-hydrochlorothiazide (PRINZIDE,ZESTORETIC) 20-25 MG per tablet TAKE 1 TABLET BY MOUTH EVERY DAY FOR BLOOD PRESSURE  30 tablet  1    Review Of Systems: Per HPI with the following additions: Denies headache, visual changes, vertigo, problems swallowing, hemoptysis, abdominal pain, diarrhea, dysuria, or lower extremity swelling that is different from normal. She does admit to shortness of breath per history of present illness and cough per history of present illness. Otherwise 12 point review of systems was performed and was unremarkable.  Physical Exam: BP 135/79  Pulse 102  Temp 99 F (37.2 C) (Oral)  Resp 17  Ht 5\' 4"  (1.626 m)  Wt 289 lb (131.09 kg)  BMI 49.61 kg/m2  SpO2 98%  LMP 04/08/2012 Exam: General: Obese female, breathing slightly heavily, no distress HEENT: Mucous membranes moist, pupils were reactive to light, extraocular movements intact, no JVD Cardiovascular: Regular rate and rhythm, no extra heart sounds appreciated Respiratory: Scattered and expiratory wheezes but good air movement throughout Abdomen: Obese, nontender, nondistended Extremities: Right lower extremity and walking cast. No pain on palpation in the calf. No appreciable edema. No skin changes. There is no edema on the left. There is no pain on palpation of the left calf. Skin: No rashes, bruising, or signs of infection Neuro: Cranial nerves 2-12 intact  Labs and Imaging: CBC BMET   Lab 05/04/12 1826  WBC 12.2*  HGB 11.3*  HCT 34.6*  PLT 311    Lab 05/04/12 1826  NA 136  K 3.5  CL 101  CO2 22  BUN 6  CREATININE 0.47*  GLUCOSE 117*  CALCIUM 9.2    CXR: No acute process CTA: b/l PEs Troponins negative Dam Ashraf, MD 05/05/2012, 2:01 AM

## 2012-05-05 NOTE — Progress Notes (Signed)
VASCULAR LAB PRELIMINARY  PRELIMINARY  PRELIMINARY  PRELIMINARY  Bilateral lower extremity venous duplex completed.    Preliminary report:  Right ; Positive for acute DVT - coursing from the proximal posterior tibial vein in the mid calf through the popliteal and femoral veins. The veins appear occluded wih some minute flow in the proximal femoral vein. There is no propagation into the common femoral. Peroneal, or profunda veins at this time. There is no evidence of superficial thrombosis or Baker's cyst .                                 Left -  There is no evidence of DVT , superficial thrombosis, or Baker's cyst.  Kayman Snuffer, RVS 05/05/2012, 10:53 AM

## 2012-05-05 NOTE — H&P (Signed)
Family Medicine Teaching Service Attending Note  I interviewed and examined patient Carla Hanson and reviewed their tests and x-rays.  I discussed with Dr. Louanne Belton and reviewed their note for today.  I agree with their assessment and plan.     Additionally  This Am continues to feel shortness of breath is coughing up sputum and is fatigued Alert tired appearing Lungs - diffuse expiratory wheeze bilaterally without crackles H - tachy RR no murmur heard but difficult exam due to wheeze and habitus Extrem - no apparent asymetry  Pulmonary emboli on CT with signifant bronchospastic component on exam.  Question 2 processes?  Treat for Pulmonary emboli with Xarelto Trial of bronchodilator to see if improves wheeze Monitor for resolution of hypoxia and improved exercise tolerance

## 2012-05-05 NOTE — Discharge Summary (Signed)
Discharge Summary 05/05/2012 5:48 PM  Carla Hanson DOB: 1966-10-11 MRN: [id number]  Date of Admission: 05/04/2012 Date of Discharge: 05/05/2012  PCP: Ellin Mayhew, MD Consultants: None  Reason for Admission: Shortness of breath/cough and bilateral pulmonary emboli    Discharge Diagnosis Primary 1. Bilateral pulmonary emboli Secondary 1. Hypertension 2. Obesity 3. Hyperlipidemia 4. Tobacco abuse  Hospital Course:  1. Pulmonary emboli - Pt with recent surgery in setting of tobacco abuse with new pulmonary emboli seen on chest CT Angiography.  PE was provoked and therefore no hypercoagulable state workup was done.  Pt was admitted to [facility] Medicine service for treatment of PEs.  She was started on a heparin drip per pharmacy and xarelto after confirming pt was not in renal failure and had birth control via tubal ligation.  Bilateral LE dopplers showed an acute DVT in the right leg.  Pt was given albuterol inhaler prn wheezing throughout hospitalization with mild improvement.  Prior to discharge, patient was trialed off of supplemental O2 and remained stable with O2 sat >90% on room air.  She was set up with follow-up at [facility] in 1 week and discharged home on therapeutic anticoagulation of xarelto 15 mg BID x 21 days total followed by 20 mg daily for a total 3 months of treatment indicated after first VTE event.  Smoking cessation was highly encouraged and pt stated she wished to quit.  Pt was given education materials on xarelto, a prescription for hydrocodone-acetaminophen 5-325 prn pain, and a prescription for an albuterol inhaler prn wheezing.  2. HLD - Chronic medical problem, though patient was not on medications for this on admission.  Checked a fasting lipid panel and LDL elevated at 122 - follow-up as outpatient.  3. HTN - Continued patient's outpatient medications with no issues throughout hospitalization.  4. Tobacco abuse - Pt seems motivated to quit smoking, so provided  nicotine patch for patient while in hospital and prescription for nicotine replacement patches as an outpatient.  Daily Physical Exam:  Vital signs: Blood pressure 170/95, pulse 93, temperature 98.8 F (37.1 C), temperature source Oral, resp. rate 20, height 5\' 4"  (1.626 m), weight 287 lb 14.7 oz (130.6 kg), last menstrual period 04/08/2012, SpO2 94.00%. Gen: obese female in hospital gown seated in no apparent distress Pulm: cough productive of moderate yellow sputum, expiratory wheeze which improves slightly with albuterol administration  Procedures:  Chest CT Angiography 05/04/12 - IMPRESSION: Bilateral pulmonary emboli, most pronounced in the right lower  lobe.    Bilateral lower extremity venous duplex - 05/05/12 - Preliminary report: Right ; Positive for acute DVT - coursing from the proximal posterior tibial vein in the mid calf through the popliteal and femoral veins. The veins appear occluded wih some minute flow in the proximal femoral vein. There is no propagation into the common femoral. Peroneal, or profunda veins at this time. There is no evidence of superficial thrombosis or Baker's cyst .      Discharge Medications Medication List  As of 05/05/2012  5:48 PM   START taking these medications         albuterol 108 (90 BASE) MCG/ACT inhaler   Commonly known as: PROVENTIL HFA;VENTOLIN HFA   Inhale 2 puffs into the lungs every 4 (four) hours as needed for wheezing or shortness of breath.      nicotine 7 mg/24hr patch   Commonly known as: NICODERM CQ - dosed in mg/24 hr   Place 1 patch onto the skin daily.      *  Rivaroxaban 15 MG Tabs tablet   Commonly known as: XARELTO   Take 1 tablet (15 mg total) by mouth 2 (two) times daily with a meal.      * Rivaroxaban 20 MG Tabs   Commonly known as: XARELTO   Take 1 tablet (20 mg total) by mouth daily with breakfast.   Start taking on: 05/26/2012     * Notice: This list has 2 medication(s) that are the same as other medications  prescribed for you. Read the directions carefully, and ask your doctor or other care provider to review them with you.       CHANGE how you take these medications         HYDROcodone-acetaminophen 5-325 MG per tablet   Commonly known as: NORCO   Take 1 tablet by mouth every 4 (four) hours as needed for pain. pain   What changed: reasons to take the med         CONTINUE taking these medications         lisinopril-hydrochlorothiazide 20-25 MG per tablet   Commonly known as: PRINZIDE,ZESTORETIC   TAKE 1 TABLET BY MOUTH EVERY DAY FOR BLOOD PRESSURE         STOP taking these medications         aspirin 325 MG tablet      guaiFENesin 100 MG/5ML liquid          Where to get your medications    These are the prescriptions that you need to pick up. We sent them to a specific pharmacy, so you will need to go there to get them.   [facility] [address], [place] - [address] AT [facility] [address]    [address] [place] [address]    Phone: [phone]        albuterol 108 (90 BASE) MCG/ACT inhaler   nicotine 7 mg/24hr patch   Rivaroxaban 15 MG Tabs tablet   Rivaroxaban 20 MG Tabs         You may get these medications from any pharmacy.         HYDROcodone-acetaminophen 5-325 MG per tablet            Pertinent Hospital Labs LDL 05/05/12 - 122  Prothrombin Time 05/04/12 - 14.1 INR 05/04/12 - 1.07 aPTT 05/04/12 - 30  Discharge instructions:  See AVS  Condition at discharge: stable  Disposition: Home  Pending Tests: None  Follow up: Follow-up Information    Follow up with [facility], Dr Gwenlyn Saran on 05/15/2012. (at 1:30 pm. Call the clinic if you have to change appointment.)          Follow up Issues:  - Please make sure patient was able to get and start xarelto and is taking it at correct dose - Review PE symptoms for improvement/worsening - f/u efforts at smoking cessation - follow-up LDL  cholesterol

## 2012-05-06 NOTE — Discharge Summary (Signed)
FMTS Attending  Patient case discussed with resident team, I agree with plan for discharge on Xarelto with close follow up in [facility].  Paula Compton, MD

## 2012-05-15 ENCOUNTER — Encounter: Payer: Self-pay | Admitting: Family Medicine

## 2012-05-15 ENCOUNTER — Ambulatory Visit (INDEPENDENT_AMBULATORY_CARE_PROVIDER_SITE_OTHER): Payer: Medicaid Other | Admitting: Family Medicine

## 2012-05-15 VITALS — BP 131/83 | HR 98 | Ht 64.0 in | Wt 287.0 lb

## 2012-05-15 DIAGNOSIS — E785 Hyperlipidemia, unspecified: Secondary | ICD-10-CM

## 2012-05-15 DIAGNOSIS — R011 Cardiac murmur, unspecified: Secondary | ICD-10-CM | POA: Insufficient documentation

## 2012-05-15 DIAGNOSIS — I10 Essential (primary) hypertension: Secondary | ICD-10-CM

## 2012-05-15 DIAGNOSIS — I2699 Other pulmonary embolism without acute cor pulmonale: Secondary | ICD-10-CM

## 2012-05-15 MED ORDER — LISINOPRIL-HYDROCHLOROTHIAZIDE 20-25 MG PO TABS: 1.0000 | ORAL_TABLET | Freq: Every day | ORAL | Status: DC

## 2012-05-15 NOTE — Assessment & Plan Note (Addendum)
Patient doing well. Shortness of breath resolved. Only having residual pain. Continues XARELTO 15 mg twice a day for 21 days followed by 20 mg daily. Continue smoking cessation.

## 2012-05-15 NOTE — Progress Notes (Signed)
Patient ID: Carla Hanson, female   DOB: [birth year], 46 y.o.   MRN: [id number] Patient ID: Carla Hanson    DOB: [birth year], 46 y.o.   MRN: [id number] --- Subjective:  Carla Hanson is a 46 y.o.female who presents for hospital follow up for acute PE which occurred on July 1. Patient was treated with heparin drip and transitioned to xarelto. Patient has been compliant with her xarelto taking it twice a day. No shortness of breath, some mild pain that's intermittent 3/10 short lasting. Goes from her back to her mid right breast similar location to where it was when she first presented with her PE in early July. Otherwise doing well. She has completely stopped smoking and does not think she needs any nicotine replacement. No swelling of her lower extremities, no pain in her lower extremities.   patient also wanting to lose weight: Diet has been modified using baked foods smaller meals drinks water no soda, light juices 40 calories per serving. Does not exercise yet she has moved from right ankle surgery will be removed in August  ROS: see HPI Past Medical History: reviewed and updated medications and allergies. Social History: Tobacco: Recently quit 05/04/2012  Objective: Filed Vitals:   05/15/12 1333  BP: 131/83  Pulse: 98    Physical Examination:   General appearance - alert, well appearing, and in no distress Neck - supple, no significant adenopathy Chest - clear to auscultation, no wheezes, rales or rhonchi, symmetric air entry Heart - normal rate, regular rhythm, normal S1, S2, +systolic flow murmur 2/6,no rubs, clicks or gallops Abdomen - soft, nontender, nondistended, no masses or organomegaly Extremities - peripheral pulses present bilaterally. Trace edema bilaterally. No calf tenderness negative Homans

## 2012-05-15 NOTE — Assessment & Plan Note (Signed)
Patient mentions having been told in the past that she has a murmur. Not symptomatic. Will recheck at next visit and potentially get an ultrasound.

## 2012-05-15 NOTE — Patient Instructions (Signed)
Everything looks great. Continue with the xarelto and the smoking cessation.  I would like to see you back in 2 months to see how everything is going.   For weight loss, continue with the weight change and incorporate walking for exercise.   Cholesterol Control Diet Cholesterol levels in your body are determined significantly by your diet. Cholesterol levels may also be related to heart disease. The following material helps to explain this relationship and discusses what you can do to help keep your heart healthy. Not all cholesterol is bad. Low-density lipoprotein (LDL) cholesterol is the "bad" cholesterol. It may cause fatty deposits to build up inside your arteries. High-density lipoprotein (HDL) cholesterol is "good." It helps to remove the "bad" LDL cholesterol from your blood. Cholesterol is a very important risk factor for heart disease. Other risk factors are high blood pressure, smoking, stress, heredity, and weight. The heart muscle gets its supply of blood through the coronary arteries. If your LDL cholesterol is high and your HDL cholesterol is low, you are at risk for having fatty deposits build up in your coronary arteries. This leaves less room through which blood can flow. Without sufficient blood and oxygen, the heart muscle cannot function properly and you may feel chest pains (angina pectoris). When a coronary artery closes up entirely, a part of the heart muscle may die, causing a heart attack (myocardial infarction). CHECKING CHOLESTEROL When your caregiver sends your blood to a lab to be analyzed for cholesterol, a complete lipid (fat) profile may be done. With this test, the total amount of cholesterol and levels of LDL and HDL are determined. Triglycerides are a type of fat that circulates in the blood and can also be used to determine heart disease risk. The list below describes what the numbers should be: Test: Total Cholesterol.  Less than 200 mg/dl.  Test: LDL "bad  cholesterol."  Less than 100 mg/dl.   Less than 70 mg/dl if you are at very high risk of a heart attack or sudden cardiac death.  Test: HDL "good cholesterol."  Greater than 50 mg/dl for women.   Greater than 40 mg/dl for men.  Test: Triglycerides.  Less than 150 mg/dl.  CONTROLLING CHOLESTEROL WITH DIET Although exercise and lifestyle factors are important, your diet is key. That is because certain foods are known to raise cholesterol and others to lower it. The goal is to balance foods for their effect on cholesterol and more importantly, to replace saturated and trans fat with other types of fat, such as monounsaturated fat, polyunsaturated fat, and omega-3 fatty acids. On average, a person should consume no more than 15 to 17 g of saturated fat daily. Saturated and trans fats are considered "bad" fats, and they will raise LDL cholesterol. Saturated fats are primarily found in animal products such as meats, butter, and cream. However, that does not mean you need to sacrifice all your favorite foods. Today, there are good tasting, low-fat, low-cholesterol substitutes for most of the things you like to eat. Choose low-fat or nonfat alternatives. Choose round or loin cuts of red meat, since these types of cuts are lowest in fat and cholesterol. Chicken (without the skin), fish, veal, and ground [place] breast are excellent choices. Eliminate fatty meats, such as hot dogs and salami. Even shellfish have little or no saturated fat. Have a 3 oz (85 g) portion when you eat lean meat, poultry, or fish. Trans fats are also called "partially hydrogenated oils." They are oils that have been  scientifically manipulated so that they are solid at room temperature resulting in a longer shelf life and improved taste and texture of foods in which they are added. Trans fats are found in stick margarine, some tub margarines, cookies, crackers, and baked goods.  When baking and cooking, oils are an excellent  substitute for butter. The monounsaturated oils are especially beneficial since it is believed they lower LDL and raise HDL. The oils you should avoid entirely are saturated tropical oils, such as coconut and palm.  Remember to eat liberally from food groups that are naturally free of saturated and trans fat, including fish, fruit, vegetables, beans, grains (barley, rice, couscous, bulgur wheat), and pasta (without cream sauces).  IDENTIFYING FOODS THAT LOWER CHOLESTEROL  Soluble fiber may lower your cholesterol. This type of fiber is found in fruits such as apples, vegetables such as broccoli, potatoes, and carrots, legumes such as beans, peas, and lentils, and grains such as barley. Foods fortified with plant sterols (phytosterol) may also lower cholesterol. You should eat at least 2 g per day of these foods for a cholesterol lowering effect.  Read package labels to identify low-saturated fats, trans fats free, and low-fat foods at the supermarket. Select cheeses that have only 2 to 3 g saturated fat per ounce. Use a heart-healthy tub margarine that is free of trans fats or partially hydrogenated oil. When buying baked goods (cookies, crackers), avoid partially hydrogenated oils. Breads and muffins should be made from whole grains (whole-wheat or whole oat flour, instead of "flour" or "enriched flour"). Buy non-creamy canned soups with reduced salt and no added fats.  FOOD PREPARATION TECHNIQUES  Never deep-fry. If you must fry, either stir-fry, which uses very little fat, or use non-stick cooking sprays. When possible, broil, bake, or roast meats, and steam vegetables. Instead of dressing vegetables with butter or margarine, use lemon and herbs, applesauce and cinnamon (for squash and sweet potatoes), nonfat yogurt, salsa, and low-fat dressings for salads.  LOW-SATURATED FAT / LOW-FAT FOOD SUBSTITUTES Meats / Saturated Fat (g)  Avoid: Steak, marbled (3 oz/85 g) / 11 g   Choose: Steak, lean (3 oz/85 g)  / 4 g   Avoid: Hamburger (3 oz/85 g) / 7 g   Choose: Hamburger, lean (3 oz/85 g) / 5 g   Avoid: Ham (3 oz/85 g) / 6 g   Choose: Ham, lean cut (3 oz/85 g) / 2.4 g   Avoid: Chicken, with skin, dark meat (3 oz/85 g) / 4 g   Choose: Chicken, skin removed, dark meat (3 oz/85 g) / 2 g   Avoid: Chicken, with skin, light meat (3 oz/85 g) / 2.5 g   Choose: Chicken, skin removed, light meat (3 oz/85 g) / 1 g  Dairy / Saturated Fat (g)  Avoid: Whole milk (1 cup) / 5 g   Choose: Low-fat milk, 2% (1 cup) / 3 g   Choose: Low-fat milk, 1% (1 cup) / 1.5 g   Choose: Skim milk (1 cup) / 0.3 g   Avoid: Hard cheese (1 oz/28 g) / 6 g   Choose: Skim milk cheese (1 oz/28 g) / 2 to 3 g   Avoid: Cottage cheese, 4% fat (1 cup) / 6.5 g   Choose: Low-fat cottage cheese, 1% fat (1 cup) / 1.5 g   Avoid: Ice cream (1 cup) / 9 g   Choose: Sherbet (1 cup) / 2.5 g   Choose: Nonfat frozen yogurt (1 cup) / 0.3 g   Choose:  Frozen fruit bar / trace   Avoid: Whipped cream (1 tbs) / 3.5 g   Choose: Nondairy whipped topping (1 tbs) / 1 g  Condiments / Saturated Fat (g)  Avoid: Mayonnaise (1 tbs) / 2 g   Choose: Low-fat mayonnaise (1 tbs) / 1 g   Avoid: Butter (1 tbs) / 7 g   Choose: Extra light margarine (1 tbs) / 1 g   Avoid: Coconut oil (1 tbs) / 11.8 g   Choose: Olive oil (1 tbs) / 1.8 g   Choose: Corn oil (1 tbs) / 1.7 g   Choose: Safflower oil (1 tbs) / 1.2 g   Choose: Sunflower oil (1 tbs) / 1.4 g   Choose: Soybean oil (1 tbs) / 2.4 g   Choose: Canola oil (1 tbs) / 1 g  Document Released: 10/21/2005 Document Revised: 10/10/2011 Document Reviewed: 04/11/2011 [facility] Patient Information 2012 [place], [place].

## 2012-05-15 NOTE — Assessment & Plan Note (Signed)
Continue lisinopril HCTZ 20/25 daily.

## 2012-05-15 NOTE — Assessment & Plan Note (Addendum)
LDL in the 120s. Dietary modification per patient's request. Will recheck in 2-3 months.

## 2012-05-17 ENCOUNTER — Other Ambulatory Visit: Payer: Self-pay | Admitting: Family Medicine

## 2012-05-19 ENCOUNTER — Telehealth: Payer: Self-pay | Admitting: Family Medicine

## 2012-05-19 NOTE — Telephone Encounter (Signed)
Spoke with patient on phone.  Pt states she feels well.  No dizziness. No syncope. But is simply concerned about the volume of her periods since this is her first period since starting the xarelto. Discussed with patient again the risks and benefits of the xarelto and that some increased bleeding with menstruation is expected. Pt states that she has transportation issues and can not come in for evaluation today.  Reviewed red flags with pt that would warrant immediate care.  Pt will come in tomorrow for a Hbg check.  Will ask admin staff to please call patient and schedule lab appointment for tomorrow.

## 2012-05-19 NOTE — Telephone Encounter (Signed)
Patient states she started period 2 days ago. Has used 18 pads out of a box of 24. Started on Xarelto 07/02. Will send message to MD.

## 2012-05-19 NOTE — Telephone Encounter (Signed)
Is on xorelto 15 mg 2 x daily and is on her period and is flowing a lot heavier than normal and wants to know what to do

## 2012-05-19 NOTE — Telephone Encounter (Signed)
Patient was contacted and appointment set for tomorrow at 11:00 for lab.

## 2012-05-20 ENCOUNTER — Other Ambulatory Visit (INDEPENDENT_AMBULATORY_CARE_PROVIDER_SITE_OTHER): Payer: Medicaid Other

## 2012-05-20 DIAGNOSIS — N920 Excessive and frequent menstruation with regular cycle: Secondary | ICD-10-CM

## 2012-05-20 LAB — POCT HEMOGLOBIN: Hemoglobin: 10.5 g/dL — AB (ref 12.2–16.2)

## 2012-05-20 NOTE — Progress Notes (Signed)
Follow up from Dr. Edmonia James phone conversation with patient yesterday.  Period has slowed down.  Pt states she feels well. No dizziness. No syncope.  Hgb = 10.5 mg/dL   Paged Dr. Edmonia James with result then discussed with Dr. Jennette Kettle.  Molli Knock for patient to leave. Dewitt Hoes, MLS

## 2012-05-28 ENCOUNTER — Telehealth: Payer: Self-pay | Admitting: Emergency Medicine

## 2012-05-28 NOTE — Telephone Encounter (Signed)
Called and spoke with patient regarding travel.  Patient reports will be traveling in car or train.  Discussed walking for 10 minutes every 2 hours.  Also discussed avoiding air travel for another 2 months or so.  Answered patient's questions.

## 2012-05-28 NOTE — Telephone Encounter (Signed)
Carla Hanson calling to inquire about whether or not it is safe for her to travel since she was diagnosed with a pulmonary embolism.  Need to have a call back asap.

## 2012-07-09 ENCOUNTER — Ambulatory Visit: Payer: Medicaid Other | Admitting: Emergency Medicine

## 2012-07-23 ENCOUNTER — Encounter: Payer: Self-pay | Admitting: Emergency Medicine

## 2012-07-23 ENCOUNTER — Ambulatory Visit (INDEPENDENT_AMBULATORY_CARE_PROVIDER_SITE_OTHER): Payer: Medicaid Other | Admitting: Emergency Medicine

## 2012-07-23 VITALS — BP 130/75 | HR 75 | Temp 98.8°F | Ht 64.0 in | Wt 293.0 lb

## 2012-07-23 DIAGNOSIS — I2699 Other pulmonary embolism without acute cor pulmonale: Secondary | ICD-10-CM

## 2012-07-23 DIAGNOSIS — I10 Essential (primary) hypertension: Secondary | ICD-10-CM

## 2012-07-23 DIAGNOSIS — F17200 Nicotine dependence, unspecified, uncomplicated: Secondary | ICD-10-CM

## 2012-07-23 MED ORDER — RIVAROXABAN 20 MG PO TABS: 20.0000 mg | ORAL_TABLET | Freq: Every day | ORAL | Status: DC

## 2012-07-23 NOTE — Assessment & Plan Note (Signed)
Working on cutting back; down to 1-4 cigarettes per day.  Discussed appt with Dr. Raymondo Band if needed; pt declines at this time.

## 2012-07-23 NOTE — Assessment & Plan Note (Signed)
Well controlled. Continue current medications  

## 2012-07-23 NOTE — Patient Instructions (Addendum)
It was nice to see you!  Congratulations on cutting back your smoking!  It sounds like you have a good plan in place to gradually stop smoking.  If you decide you want some help with this, call our office and ask for a smoking cessation appointment with Dr. Raymondo Band.  I would also like to check your pulmonary function once your cold is better.  Sometime in October, call the office to set up an appointment for PFTs.  You can eat as many greens as you like.  It will not affect the blood thinner you are taking.  Follow up with me in 3 months.

## 2012-07-23 NOTE — Progress Notes (Signed)
  Subjective:    Patient ID: Carla Hanson, female    DOB: 11/05/1965, 46 y.o.   MRN: [id number]  HPI RAIVEN BELIZAIRE is here for follow up.  1. Pulmonary embolism: Hospitalized in June for provoked PE.  Complaint with xarelto.  Concerned about ability to eat greens while on xarelto.  Down to 1-4 cigarettes per day.  Planning on continuing to cut back until she has quit. Also been cleared for exercise by ortho for ankle fracture.  2. Cough: Reports cough for the last 2 days with a scratchy throat.  Drinking hot tea with lemon or honey in it and using cough drops as needed.  Has been using albuterol inhaler more frequently with cough (2-4x per day).  Does report some wheezing intermittently even when not coughing.  Hypertension Well controlled: yes Compliant with medication: yes Side effects from medication: no Check BP at home: no  Chest pain: no Palpitations: no Vision changes: no Leg edema: no Dizziness: no  I have reviewed and updated the following as appropriate: allergies and current medications SHx: current smoker, working on quitting    Review of Systems See HPI    Objective:   Physical Exam BP 130/75  Pulse 75  Temp 98.8 F (37.1 C) (Oral)  Ht 5\' 4"  (1.626 m)  Wt 293 lb (132.904 kg)  BMI 50.29 kg/m2 Gen: alert, cooperative, NAD HEENT: AT/[place], sclera white, MMM, mild pharyngeal erythema, no exudate, no nasal discharge CV: RRR, no murmurs Pulm: CTAB, no wheezes or rales      Assessment & Plan:  Cough: Likely viral URI, okay to continue with cough drops and albuterol as needed.  Will have her return to clinic for PFTs once cold resolves.

## 2012-07-23 NOTE — Assessment & Plan Note (Signed)
Doing well with xarelto.  Okay to eat greens with this medication.  Will continue treatment for 6 months.

## 2012-08-27 ENCOUNTER — Ambulatory Visit: Payer: Medicaid Other | Admitting: Pharmacist

## 2012-09-03 ENCOUNTER — Ambulatory Visit: Payer: Medicaid Other | Admitting: Pharmacist

## 2012-09-08 ENCOUNTER — Ambulatory Visit: Payer: Medicaid Other | Admitting: Pharmacist

## 2012-09-10 ENCOUNTER — Telehealth: Payer: Self-pay | Admitting: Psychology

## 2012-09-10 NOTE — Telephone Encounter (Signed)
Patient left a VM to schedule an appointment.  I think I have seen her in the past but not recent past.  I am not taking new patients now.  Called her back and left a VM.  Will get a sense of the reason for referral (not clear from last couple of notes) and identify an appropriate resource.  Will send to PCP for clarification.

## 2012-09-14 NOTE — Telephone Encounter (Signed)
Connected with patient by phone.  She is seeing a therapist / Dance movement psychotherapist at [facility].  She says she is going to school there but that this therapist is a mental health therapist.  The therapist has recommended that she see someone to discuss medications for mood.  Was on Celexa for some time.  Last note (03/12/11) said she was doing well on 20 mg.  Patient reports today that she does not think the medication was that helpful.  Patient says therapist is questioning whether she might have Bipolar Disorder.  First available MDC is not until 12/18 at 10:30 for a new patient.  Patient has an appointment with Dr. Elwyn Reach on November 25th.  Recommended that she attend that appointment or schedule another one to discuss mood issues exclusively with a follow-up for MDC on the 18th.  She voiced an understanding.  I told her the following: -  If she is unable to make the appointment she needs to call me. -  If she misses the appointment without a phone call, I won't be able to schedule her back in my clinic. She voiced an understanding and was able to repeat the appointment date and time back to me.

## 2012-09-28 ENCOUNTER — Ambulatory Visit: Payer: Medicaid Other | Admitting: Emergency Medicine

## 2012-10-15 ENCOUNTER — Encounter: Payer: Self-pay | Admitting: Emergency Medicine

## 2012-10-15 ENCOUNTER — Ambulatory Visit (INDEPENDENT_AMBULATORY_CARE_PROVIDER_SITE_OTHER): Payer: Medicaid Other | Admitting: Emergency Medicine

## 2012-10-15 VITALS — BP 159/89 | HR 90 | Ht 64.0 in | Wt 286.0 lb

## 2012-10-15 DIAGNOSIS — I10 Essential (primary) hypertension: Secondary | ICD-10-CM

## 2012-10-15 DIAGNOSIS — F17200 Nicotine dependence, unspecified, uncomplicated: Secondary | ICD-10-CM

## 2012-10-15 DIAGNOSIS — I2699 Other pulmonary embolism without acute cor pulmonale: Secondary | ICD-10-CM

## 2012-10-15 DIAGNOSIS — F339 Major depressive disorder, recurrent, unspecified: Secondary | ICD-10-CM

## 2012-10-15 LAB — CBC
Hemoglobin: 9.4 g/dL — ABNORMAL LOW (ref 12.0–15.0)
MCH: 20 pg — ABNORMAL LOW (ref 26.0–34.0)
Platelets: 499 10*3/uL — ABNORMAL HIGH (ref 150–400)
RBC: 4.7 MIL/uL (ref 3.87–5.11)
WBC: 11.5 10*3/uL — ABNORMAL HIGH (ref 4.0–10.5)

## 2012-10-15 LAB — TSH: TSH: 0.709 u[IU]/mL (ref 0.350–4.500)

## 2012-10-15 MED ORDER — VARENICLINE TARTRATE 0.5 MG X 11 & 1 MG X 42 PO MISC: ORAL | Status: DC

## 2012-10-15 MED ORDER — RIVAROXABAN 20 MG PO TABS: 20.0000 mg | ORAL_TABLET | Freq: Every day | ORAL | Status: DC

## 2012-10-15 NOTE — Progress Notes (Addendum)
  Subjective:    Patient ID: Carla Hanson, female    DOB: [birth year], 46 y.o.   MRN: [id number]  HPI Carla Hanson is here for f/u and mood.  Hypertension Well controlled: no - elevated today, but typically well controlled Compliant with medication: yes Side effects from medication: no Check BP at home: no  Chest pain: no Palpitations: no Vision changes: no Leg edema: no Dizziness: no  Pulmonary embolism She is now 6 months out from her provoked DVT/PE.  Compliant with xarelto.   Mood She reports increased irritability, racing thoughts, decreased mood and increased stress.  She has been seeing a Veterinary surgeon.  She has been journaling a little as well which she shared with me today.  Many of the entries involve irritability and racing thoughts.  She has an MDC appt next week.  She was previously diagnosed with depression and has been on Celexa in the past which she state did not help much.  She has never been told she has bipolar.  Does have a family history of bipolar.   I have reviewed and updated the following as appropriate: allergies, current medications and problem list SHx: current smoker, wants to quit, wants to try Chantix   Review of Systems Also complaining of pica (ice) and cold intolerance    Objective:   Physical Exam BP 159/89  Pulse 90  Ht 5\' 4"  (1.626 m)  Wt 286 lb (129.729 kg)  BMI 49.09 kg/m2 Gen: alert, cooperative, NAD, no pressured speech HEENT: AT/[place], sclera white, conjunctiva pale, MMM CV: RRR, no murmurs Pulm: CTAB, no wheezes or rales Ext: no edema     Assessment & Plan:

## 2012-10-15 NOTE — Assessment & Plan Note (Signed)
Elevated today, but typically well controlled.  Will recheck at follow up in 1 month and adjust medications if needed.

## 2012-10-15 NOTE — Patient Instructions (Addendum)
It was nice to see you! I'm glad you are seeing Dr. Pascal Lux next week. Your blood pressure is a little elevated today, but it has been good previously.  We will recheck it at your follow up appointment. We are also going to check some blood work today.  I will give you a call with the results, probably Friday or Monday. Please follow up with me the beginning of January.

## 2012-10-15 NOTE — Assessment & Plan Note (Signed)
6 months out.  On Xarelto.  Discussed extensively with patient.  My preference would be to keep her on Xarelto another 6 months and discontinue at that time.  Patient agrees with this plan.

## 2012-10-15 NOTE — Assessment & Plan Note (Signed)
Wants to quit, but has been unsuccessful on her own.  Would like to try Chantix.  Will given prescription for Chantix.  Follow up in 1 month.

## 2012-10-15 NOTE — Assessment & Plan Note (Signed)
PHQ-9: 19 (3 for sleep, energy and appetite; 2 for anhedonia, depression, failure, concentration and movement).  Denies SI/HI. MDQ: yes to everything except foolish action, spending money, and being told she has bipolar.  Based on this and her journal comments, suspect she may be more on the bipolar spectrum.  She has an appointment in [facility] next week.  I will hold off on starting anything until she has been seen in [facility].  She will f/u with me in early January.

## 2012-10-16 ENCOUNTER — Telehealth: Payer: Self-pay | Admitting: Emergency Medicine

## 2012-10-16 NOTE — Telephone Encounter (Signed)
Called and spoke with patient regarding lab results.  TSH in normal.  Hgb is low with an elevated RDW and microcytosis.  Given her report of heavier periods on Xarelto, this is the likely the cause of iron deficiency anemia.  Instructed her to started taking OTC ferrous sulfate 325mg  BID with food.  Has follow up in January and will recheck CBC at that time.  If anemia does not improve with iron supplementation, may need to consider stopping xarelto at 9 months rather than the full year.

## 2012-10-21 ENCOUNTER — Telehealth: Payer: Self-pay | Admitting: Emergency Medicine

## 2012-10-21 ENCOUNTER — Encounter: Payer: Self-pay | Admitting: Psychology

## 2012-10-21 ENCOUNTER — Ambulatory Visit (INDEPENDENT_AMBULATORY_CARE_PROVIDER_SITE_OTHER): Payer: Medicaid Other | Admitting: Psychology

## 2012-10-21 DIAGNOSIS — F319 Bipolar disorder, unspecified: Secondary | ICD-10-CM

## 2012-10-21 MED ORDER — LISINOPRIL-HYDROCHLOROTHIAZIDE 20-25 MG PO TABS: 1.0000 | ORAL_TABLET | Freq: Every day | ORAL | Status: DC

## 2012-10-21 NOTE — Patient Instructions (Addendum)
I am glad you have a follow-up scheduled with Dr. Elwyn Reach.  She will get a Depakote level during that appointment and check on how you are tolerating the medicine, safety and whether you are noticing any benefit. Please schedule a follow up in [facility] on January 29th at 10:30.  Call [phone] with any questions or concerns. There is a website called [email or web address] that can give you some helpful information about Bipolar Disorder.  Write down any questions you may have and we can address at your next appointment.   Please bring in Mood Chart for your appointment with Dr. Elwyn Reach and your follow-up with [place].   For your Depakote, please take one pill for three days and then two pills.  Always with a meal.

## 2012-10-21 NOTE — Telephone Encounter (Signed)
Prescription sent to [facility] electronically.

## 2012-10-21 NOTE — Assessment & Plan Note (Addendum)
Report of mood is good today.  Affect is within normal limits.  Thoughts are clear and goal directed.  Speech is normal in rate.  Denies hallucinations or delusions.  Denies suicidal ideation.  Admits to homicidal ideation against ex-boyfriend but denies plan or intent.    History provided today is consistent with Bipolar Disorder.  She may meet criteria for Bipolar I and definitely meets criteria for Bipolar II.  Discussed treatment options and specifically considered Depakote and Latuda.  Wanted to stay away from medications that might lead to weight gain.  Carla Hanson was most interested in targeting the irritability.  Dr. Kathrynn Running recommended Depakote.  See patient instructions for further plan.

## 2012-10-21 NOTE — Telephone Encounter (Signed)
Fwd. To Dr.Booth for refill .Carla Hanson  

## 2012-10-21 NOTE — Telephone Encounter (Signed)
Dr Elwyn Reach was supposed to call in her lisinopril and pharmacy still doesn't have it.  [facility] [address]

## 2012-10-21 NOTE — Progress Notes (Signed)
Carla Hanson presents for an initial [facility] evaluation.  I saw her in my [facility] two times in 2009.  She presents today at the urging of her therapist, Carla Hanson, who reportedly questions whether [place] might have Bipolar Disorder.  Was last seen by her PCP on 10/15/12.  She had a positive MDQ and a score of 19 on the PHQ-9.  Report of symptoms (SIGECAPS): Carla Hanson reports she is struggling with depression, racing thoughts, isolation, crying spells and aggravation.  She notes a pattern of behavior where she has 3-4 days of energy, racing thoughts and aggravation.  She cleans during this time period as a way to focus her attention and distract herself from her racing thoughts.  Aggravation is such that she has started arguments and breaks dishes and glasses.  At times, she has purchased glasses for the express purpose of breaking them.  She has also called friends in the middle of the night as a result of her energy and inability to sleep.  She redecorates and rearranges furniture as well.  She has a decreased need for sleep during this period of time and then eventually "crashes."  Sadness is present during crashes.  Has had difficulty getting out of bed in the past.    Duration of symptoms: She says these symptoms started around age 89 or 63 and have persisted most of the time since that time.  Impact on function: She is applying for disability for her weight, a plate in her ankle and her mental health issues.  She is attending school currently.    Psychiatric History (include age of onset of first mood disturbance): She denies prior treatment for mood issues except for Celexa.  In our old EMR, I noted she was tried on Wellbutrin, Ativan and Ambien in the past.    - Diagnoses:  She has never been told she has Bipolar Disorder.  She has been told she has depression.  - Treatment (include hospitalizations, pharmacotherapy and outpatient therapy):  Denies hospitalization in her or her family members  for mood issues.  Currently getting therapy at [facility].  Family history of psychiatric issues: Reports that one sister and one maternal aunt has been diagnosed and is being treated for Bipolar Disorder.  She says her other sister has significant mental health issues.  States that her father's side of the family had mental health issues but she does not know any specifics.    Current and history of substance use: Drinks alcohol about two weekends out of the month.  Drinks about three drinks each time.  Used to be a "pothead" (her word) but stopped.  Last use was one month ago.    Problem list and medications reviewed.    Of note - review of my notes from 2009.  Significant stressor related to husband.  Tumultuous relationship.

## 2012-10-30 ENCOUNTER — Encounter: Payer: Self-pay | Admitting: *Deleted

## 2012-10-30 ENCOUNTER — Telehealth: Payer: Self-pay | Admitting: Emergency Medicine

## 2012-10-30 MED ORDER — ALBUTEROL SULFATE HFA 108 (90 BASE) MCG/ACT IN AERS: 2.0000 | INHALATION_SPRAY | RESPIRATORY_TRACT | Status: DC | PRN

## 2012-10-30 NOTE — Telephone Encounter (Signed)
Is asking for refill on her albuterol inhaler - needs this today  [facility] [address]

## 2012-10-30 NOTE — Telephone Encounter (Signed)
Dr. Elwyn Reach is unavailable and there is no note on her box as to who is taking her messages. Will send this message to Dr. Earnest Bailey for refill.  Looks like it was prescribed once  at hospital visit  05/05/2012.Marland Kitchen

## 2012-11-02 NOTE — Telephone Encounter (Signed)
This encounter was created in error - please disregard.

## 2012-11-09 ENCOUNTER — Telehealth: Payer: Self-pay | Admitting: Psychology

## 2012-11-09 NOTE — Telephone Encounter (Signed)
Patient left a VM on Friday, January 3rd at 4:39 pm stating that the Depakote pills are too big for her to swallow.  She has not taken any of the medicine.  The medicine was prescribed on 12/18.  I asked if there were any other reasons why she was uncomfortable taking the medicine and she said she was unsure about Depakote (but couldn't state a reason why) but was willing to try Latuda.  I will discuss with Dr. Kathrynn Running.  She has a follow-up with Dr. Elwyn Reach on 11/11/12 to check on medication and then a follow-up in [facility] on 1/29.

## 2012-11-11 ENCOUNTER — Ambulatory Visit (INDEPENDENT_AMBULATORY_CARE_PROVIDER_SITE_OTHER): Payer: Medicaid Other | Admitting: Emergency Medicine

## 2012-11-11 ENCOUNTER — Encounter: Payer: Self-pay | Admitting: Emergency Medicine

## 2012-11-11 VITALS — BP 145/74 | HR 77 | Ht 64.0 in | Wt 288.0 lb

## 2012-11-11 DIAGNOSIS — F319 Bipolar disorder, unspecified: Secondary | ICD-10-CM

## 2012-11-11 DIAGNOSIS — F17200 Nicotine dependence, unspecified, uncomplicated: Secondary | ICD-10-CM

## 2012-11-11 DIAGNOSIS — E669 Obesity, unspecified: Secondary | ICD-10-CM

## 2012-11-11 DIAGNOSIS — I10 Essential (primary) hypertension: Secondary | ICD-10-CM

## 2012-11-11 MED ORDER — LURASIDONE HCL 40 MG PO TABS: 20.0000 mg | ORAL_TABLET | Freq: Every day | ORAL | Status: DC

## 2012-11-11 NOTE — Assessment & Plan Note (Signed)
Interested in weight loss.  Appears very motivated now that she has quit smoking.  Discussed gradual return to activity.  Will get final clearance from ortho next month.  She is working on cutting back her sugar intake and increasing whole grains and vegetables.  Mother is a good role model and motivator for the patient.  Follow up in 1 month.

## 2012-11-11 NOTE — Progress Notes (Signed)
  Subjective:    Patient ID: Carla Hanson, female    DOB: 10-04-1966, 47 y.o.   MRN: [id number]  HPI Carla Hanson is here for mood and weight.  1. Bipolar: She was recently seen in [facility] and started on Depakote.  However, she has not taken any depakote due to the size of the pills.  We discussed alternative options, and she mentioned that Dr. Kathrynn Hanson had talked about Latuda and was interested in trying that medicine.  States today is a "good day."  2. Weight: She is interested to try and loose some weight now that she has quit smoking.  She states that her mother is an excellent role model and partner in fitness.  She is concerned about doing too much too soon, especially with her ankle fracture and ORIF last year.  She states she is working on cutting back on sugar and has cut out the sugary drinks.  3. Smoking cessation: Going well with Chantix.  No smoking in the last 2 weeks.  I have reviewed and updated the following as appropriate: allergies and current medications SHx: former smoker   Review of Systems See HPI    Objective:   Physical Exam BP 145/74  Pulse 77  Ht 5\' 4"  (1.626 m)  Wt 288 lb (130.636 kg)  BMI 49.44 kg/m2 Gen: alert, cooperative, NAD HEENT: AT/[place], sclera white, MMM Neck: supple, no LAD CV: RRR, no murmurs Pulm: CTAB, no wheezes or rales      Assessment & Plan:

## 2012-11-11 NOTE — Assessment & Plan Note (Signed)
Currently on chantix.  Going very well.  No cigarettes for 2 weeks.

## 2012-11-11 NOTE — Assessment & Plan Note (Signed)
Mildly elevated today.  Will recheck at f/u in 1 month and adjust medication as needed.  Hopefully, with her diet and exercises changes, we will not need to increase the medicine.

## 2012-11-11 NOTE — Assessment & Plan Note (Addendum)
Doing pretty well today.  Will switch to Latuda; prescription given for 40mg  tablets, take 1/2 tab daily.  Has follow up in [facility] on 1/29.   Discussed side effects.

## 2012-11-11 NOTE — Patient Instructions (Addendum)
It was nice to see you! I sent a prescription for Latuda.  Take 1/2 a tablet daily with breakfast (or at bedtime if it makes you sleepy).   Follow up with Dr. Pascal Lux and Dr. Kathrynn Running as scheduled. You have a great plan for working on your weight! Follow up with me in February.

## 2012-11-17 ENCOUNTER — Telehealth: Payer: Self-pay | Admitting: Emergency Medicine

## 2012-11-17 MED ORDER — VARENICLINE TARTRATE 1 MG PO TABS: 1.0000 mg | ORAL_TABLET | Freq: Two times a day (BID) | ORAL | Status: DC

## 2012-11-17 NOTE — Telephone Encounter (Signed)
Prescription sent electronically

## 2012-11-17 NOTE — Telephone Encounter (Signed)
Patient is calling because she needs a refill on Chantix sent to [facility] on [address] and [address].

## 2012-11-17 NOTE — Telephone Encounter (Signed)
Will fwd. To PCP for refill. .Teja Costen  

## 2012-11-19 ENCOUNTER — Other Ambulatory Visit: Payer: Self-pay | Admitting: Emergency Medicine

## 2012-11-19 DIAGNOSIS — Z1231 Encounter for screening mammogram for malignant neoplasm of breast: Secondary | ICD-10-CM

## 2012-12-02 ENCOUNTER — Telehealth: Payer: Self-pay | Admitting: Psychology

## 2012-12-02 ENCOUNTER — Ambulatory Visit: Payer: Medicaid Other | Admitting: Psychology

## 2012-12-02 NOTE — Telephone Encounter (Signed)
Carla Hanson left a VM canceling her [facility] follow-up secondary to the snow.  Called her back and discussed follow-up for 2/19 at 9:30.  She states she has an appointment with Dr. Elwyn Reach on 2/7.  She says she is doing well on the medicine and is noticing a difference.

## 2012-12-11 ENCOUNTER — Ambulatory Visit: Payer: Medicaid Other | Admitting: Emergency Medicine

## 2012-12-16 ENCOUNTER — Ambulatory Visit: Payer: Medicaid Other

## 2012-12-23 ENCOUNTER — Ambulatory Visit (INDEPENDENT_AMBULATORY_CARE_PROVIDER_SITE_OTHER): Payer: Medicaid Other | Admitting: Psychology

## 2012-12-23 ENCOUNTER — Ambulatory Visit: Payer: Medicaid Other | Admitting: Psychology

## 2012-12-23 DIAGNOSIS — F319 Bipolar disorder, unspecified: Secondary | ICD-10-CM

## 2012-12-23 NOTE — Assessment & Plan Note (Signed)
Report of mood is depressed but somewhat improved.  Tolerating medicine fine.  Dr. Kathrynn Running recommended increasing to 40 mg of Latuda and to follow-up.  Therapy is highly recommended based on the feelings she expressed to me about smoking the marijuana and also about wrapping her brain around her diagnosis.    Quite tearful around the disclosures at the end of our visit.  Denied SI.  See patient instructions for further plan.

## 2012-12-23 NOTE — Progress Notes (Signed)
Carla Hanson presents for follow-up.  She brings her mood chart and a journal because the mood chart proved overwhelming.  She is nervous with three people in the room.  Reported she is taking 20 mg of Latuda without side effects.  She thinks she is getting better sleep and thinking better (less cloudy).  She is crying a lot lately but thinks this might be slightly better as well.  There has been a recent change in a relationship so tears are related to this.  They also come out of nowhere.    She is going to school at [employer] and says her function is better here.  She thinks she is a little "manic" with her house cleaning.  Up at night cleaning three times int he last month.    Near the end of our appointment, she asked to meet with me alone.  Disclosed that about a week ago she smoked marijuana one time because she couldn't sleep.

## 2012-12-23 NOTE — Patient Instructions (Signed)
Please schedule a follow up for [facility] for March 19th at 9:30. Please schedule a therapy appointment for Feb. 21st at 9:00. Thank you for your honesty.  I promise you - it was more important for your well being to tell me that then it is for me to know it.   Increase your Latuda to 40 mg a day.  Always with a meal.

## 2012-12-25 ENCOUNTER — Ambulatory Visit (INDEPENDENT_AMBULATORY_CARE_PROVIDER_SITE_OTHER): Payer: Medicaid Other | Admitting: Psychology

## 2012-12-25 DIAGNOSIS — F319 Bipolar disorder, unspecified: Secondary | ICD-10-CM

## 2012-12-25 NOTE — Progress Notes (Signed)
Carla Hanson presents for a therapy appointment.  She was seen in [facility] two days ago and she requested therapy.  I had forgotten that she reported seeing a counselor - Financial controller through the [facility] mentoring program.  Carla Hanson said that Carla Hanson thought someone with more expertise in treating Bipolar Disorder would be good.  Carla Hanson plans on continuing to meet with Carla Hanson - about twice a week for 30-40 minutes.  Got a little more family history today.  Carla Hanson has four children ages 3 to 64.  All are out of the house except the 47 year old.  She is separated from her husband and getting a divorce.  They had a history of multiple separations.  He ended up having an affair with the woman across the street and she got pregnant.  That was the final straw for Carla Hanson.  That was about 2+ years ago.  He no longer keeps in touch with his 43 year old daughter and Carla Hanson has minimal contact with him.  Carla Hanson is maintaining a friendship with Carla Hanson - a man with whom she had a romantic relationship.  Discussed this at length.    Discussed drinking.  She does not drink regularly but will binge drink (ie a twelve pack in a day).    Prior to her appointment, I reviewed Carla Hanson's journal she brought.  She has identified a pattern for herself where for 3-4 days she has a low mood where it is hard to motivate.  She might be in bed most of the time.  She will take a shower and often times tries to create something different via cleaning or listening to music.  She feels drained though and ends up back in bed.  She also has high moods where she feels good.  She spends this time usually cleaning her house and rearranging furniture.  She gets irritated if her daughter tries to interfere.  Her daughter knows just to stay out of the way and usually leaves the house.

## 2012-12-25 NOTE — Patient Instructions (Signed)
Please schedule a follow up for:  March 10th at 11:00. The website that might give you good information is [email or web address]

## 2012-12-25 NOTE — Assessment & Plan Note (Signed)
Report of mood today is euthymic.  Affect is consistent.  Thoughts are clear and goal directed.  She is tolerating the increased dose of Latuda (40 mg) fine.    I will call her therapist to see if there is any overlap between what the two of [place] are doing and to ensure we are providing consistent messages.  I forgot to print out the AVS so will call [place] with the website information.  Discussed concept of emotional control today.  With pharmacotherapy and psychotherapy, the hope is that she will be able to experience a normal range of emotions and not act on them in unhealthy ways.  Function will remain intact.

## 2012-12-31 ENCOUNTER — Ambulatory Visit (INDEPENDENT_AMBULATORY_CARE_PROVIDER_SITE_OTHER): Payer: Medicaid Other | Admitting: Emergency Medicine

## 2012-12-31 ENCOUNTER — Encounter: Payer: Self-pay | Admitting: Emergency Medicine

## 2012-12-31 VITALS — BP 125/70 | HR 87 | Ht 64.0 in | Wt 295.0 lb

## 2012-12-31 DIAGNOSIS — Z5181 Encounter for therapeutic drug level monitoring: Secondary | ICD-10-CM

## 2012-12-31 DIAGNOSIS — F17200 Nicotine dependence, unspecified, uncomplicated: Secondary | ICD-10-CM

## 2012-12-31 DIAGNOSIS — F319 Bipolar disorder, unspecified: Secondary | ICD-10-CM

## 2012-12-31 DIAGNOSIS — E669 Obesity, unspecified: Secondary | ICD-10-CM

## 2012-12-31 DIAGNOSIS — I10 Essential (primary) hypertension: Secondary | ICD-10-CM

## 2012-12-31 LAB — CBC
Hemoglobin: 7.3 g/dL — ABNORMAL LOW (ref 12.0–15.0)
MCHC: 29.3 g/dL — ABNORMAL LOW (ref 30.0–36.0)
Platelets: 550 10*3/uL — ABNORMAL HIGH (ref 150–400)
RBC: 4.08 MIL/uL (ref 3.87–5.11)
RDW: 18.3 % — ABNORMAL HIGH (ref 11.5–15.5)
WBC: 10.8 10*3/uL — ABNORMAL HIGH (ref 4.0–10.5)

## 2012-12-31 NOTE — Assessment & Plan Note (Signed)
Provided encouragement.  Discussed low-carb diet with handout provided and brisk walking 15 minutes daily.  Will follow up in 2 months.  Could consider referral for bariatric surgery if continues to have no success with diet and exercise.

## 2012-12-31 NOTE — Assessment & Plan Note (Signed)
Seen in [facility].  Latuda increased to 40mg  daily recently.  Will check cbc and a1c today as she is at risk for developing diabetes.

## 2012-12-31 NOTE — Assessment & Plan Note (Signed)
Well controlled. Continue current medications  

## 2012-12-31 NOTE — Progress Notes (Signed)
  Subjective:    Patient ID: Carla Hanson, female    DOB: 03/14/1966, 47 y.o.   MRN: [id number]  HPI HIILEI GERST is here for f/u of medical issues.  Hypertension Well controlled: yes Compliant with medication: yes Side effects from medication: no Check BP at home: no  Chest pain: no Palpitations: no Vision changes: no Leg edema: yes - in left ankle from old ankle injury Dizziness: no  Obesity She expresses frustration and disappointment that she is unable to loss weight.  She wants to and has been cutting out fried food and sweets.  She does states that since she has quit smoking, she craves sweets a little more.  Eats a lot of pasta and potatoes.  Walks at a slow pace most days.   Bipolar Followed by [facility].  Latuda increased to 40mg  daily recently.  She is concerned about her blood sugar on this medicine.  Tobacco Abuse Currently on last month of Chantix.  Continues to be cigarette free.  I have reviewed and updated the following as appropriate: allergies, current medications, past family history, past medical history, past social history and past surgical history SHx: former smoker  Review of Systems See HPI    Objective:   Physical Exam BP 125/70  Pulse 87  Ht 5\' 4"  (1.626 m)  Wt 295 lb (133.811 kg)  BMI 50.61 kg/m2  LMP 12/16/2012 Gen: alert, cooperative, NAD, appears tired, obese HEENT: AT/[place], sclera white, MMM Neck: supple CV: RRR, no murmurs Pulm: CTAB, no wheezes or rales Ext: no edema on right, trace on left ankle only      Assessment & Plan:

## 2012-12-31 NOTE — Assessment & Plan Note (Signed)
On last month of Chantix.  Doing well with cessation.

## 2012-12-31 NOTE — Patient Instructions (Addendum)
It was nice to see you! Don't get discouraged with the weight loss - it takes time. We are going to try the following... -Eat 75% of your food off the low-carb foods list I gave you -walk 15 minutes every day - your heart rate should go up to the 150s or 160s at the end of the walk. If we need to, I can refer you to bariatrics for possible surgery. We are going to check your sugar level and blood counts today.  If anything is wrong, I will call you. Follow up in 2 months.

## 2013-01-01 ENCOUNTER — Encounter: Payer: Self-pay | Admitting: Emergency Medicine

## 2013-01-01 ENCOUNTER — Telehealth: Payer: Self-pay | Admitting: Emergency Medicine

## 2013-01-01 NOTE — Telephone Encounter (Signed)
Called and spoke with patient regarding lab results.   A1c is pre-diabetic.  Discussed importance of following the low-carb diet discussed at the appt yesterday.  CBC showed anemia, likely iron deficient with low MCV.  Instructed her to get iron supplements OTC ferrous sulfate and take TID.  Discussed possible constipation as side effect, if a problem take 1-2 tablets daily.  Patient expressed understanding and agreement.  All her questions were answered.  Follow up in office in 2 months.

## 2013-01-11 ENCOUNTER — Telehealth: Payer: Self-pay | Admitting: Psychology

## 2013-01-11 ENCOUNTER — Ambulatory Visit: Payer: Medicaid Other | Admitting: Psychology

## 2013-01-11 NOTE — Telephone Encounter (Signed)
Patient called this am to cancel 11:00 beh med appointment.  She has a bad cold.  Rescheduled for 3/24 at 9:00.  She has a follow-up for [facility] before then.

## 2013-01-18 ENCOUNTER — Encounter: Payer: Self-pay | Admitting: Emergency Medicine

## 2013-01-18 ENCOUNTER — Telehealth: Payer: Self-pay | Admitting: Emergency Medicine

## 2013-01-18 ENCOUNTER — Ambulatory Visit: Payer: Medicaid Other

## 2013-01-18 NOTE — Telephone Encounter (Signed)
Error

## 2013-01-18 NOTE — Telephone Encounter (Signed)
Returned call to patient.  Wants to know what she can try for her cough.  Informed she can try OTC Delsym.  Can also use throat lozenges and drink plenty of fluids.  Patient states she has been using lozenges and drinking fluids.  Wants an appt, but unable to come in today due to transportation issues.  Has an appt with Dr. Pascal Lux on 01/20/13 at 9:30 am and wants to come in to see Dr. Elwyn Reach.  Appt scheduled with Dr. Elwyn Reach for 01/20/13 at 10:15 am.  Patient will try Delsym until appt.   Gaylene Brooks, RN

## 2013-01-18 NOTE — Telephone Encounter (Signed)
Patient is calling because she caught a cold from her granddaughter about a week ago, it is getting better but the cough is continuous.  She doesn't know if there is something over the counter that she can take with the other meds she is on.

## 2013-01-20 ENCOUNTER — Encounter: Payer: Self-pay | Admitting: Emergency Medicine

## 2013-01-20 ENCOUNTER — Ambulatory Visit (INDEPENDENT_AMBULATORY_CARE_PROVIDER_SITE_OTHER): Payer: Medicaid Other | Admitting: Psychology

## 2013-01-20 ENCOUNTER — Ambulatory Visit (INDEPENDENT_AMBULATORY_CARE_PROVIDER_SITE_OTHER): Payer: Medicaid Other | Admitting: Emergency Medicine

## 2013-01-20 VITALS — BP 120/81 | HR 92 | Temp 98.2°F | Ht 63.0 in | Wt 285.0 lb

## 2013-01-20 DIAGNOSIS — J209 Acute bronchitis, unspecified: Secondary | ICD-10-CM

## 2013-01-20 DIAGNOSIS — F319 Bipolar disorder, unspecified: Secondary | ICD-10-CM

## 2013-01-20 MED ORDER — DOXYCYCLINE HYCLATE 100 MG PO TABS: 100.0000 mg | ORAL_TABLET | Freq: Two times a day (BID) | ORAL | Status: DC

## 2013-01-20 MED ORDER — ALBUTEROL SULFATE HFA 108 (90 BASE) MCG/ACT IN AERS: 2.0000 | INHALATION_SPRAY | Freq: Four times a day (QID) | RESPIRATORY_TRACT | Status: DC | PRN

## 2013-01-20 NOTE — Patient Instructions (Addendum)
It was nice to see you! I think you have a bronchitis. Take doxycyline (an antibiotic), 1 pill twice a day for 10 days. I sent in a prescription for ProAir, use it as needed. Follow up with me as scheduled later this month.

## 2013-01-20 NOTE — Patient Instructions (Addendum)
It was great to see you today.  Please schedule a follow-up for for:  April 30th at 9:30. Dr. Kathrynn Running recommend that you stay at 40 mg of Latuda for now.  You have been on it a month and have noticed some improvement. During your therapy appointment, I will ask you more about some of the behaviors we touched on today - specifically the cleaning.

## 2013-01-20 NOTE — Progress Notes (Signed)
Carla Hanson presents for follow-up.  She does not have anything for the agenda.  She is taking 40 mg of Latuda with no missed doses reproted.  She reports that in the last two weeks, she has had a four episodes of bothersome depressed mood that lasted a few hours.  She notes that this is an improvement over how she was feeling.  She notes about the same or more irritable episodes in the same time frame.  She gets most irritable it seems when the house is not cleaned or picked up according to her standards.  This is a challenge with her 39 year old daughter in the home.    Carla Hanson reports she uses affirmation and self-talk to help her manage her moods.

## 2013-01-20 NOTE — Assessment & Plan Note (Addendum)
Improvement in symptoms after one month of Latuda 40 mg.  Complete remission of symptoms is not yet present.  Dr. Kathrynn Running thought it reasonable to give the medicine more time especially now since Carla Hanson is attending therapy.  I will see her in therapy on the 24th and she will follow in [facility] as scheduled.  See patient instructions.

## 2013-01-20 NOTE — Progress Notes (Signed)
  Subjective:    Patient ID: Carla Hanson, female    DOB: 07-07-1966, 47 y.o.   MRN: [id number]  HPI [name] is here for a SDA for cough.  She reports that 2 weeks ago she caught a cold from her granddaughter.  At the beginning, she had cough, runny nose and fever.  Everything has resolved except for a persistent cough that is productive of green sputum.  She has tried OTC delsym with some improvement. Mild dyspnea. Using her albuterol inhaler helps some with the cough.  I have reviewed and updated the following as appropriate: allergies and current medications SHx: quit smoking 3 months ago  Review of Systems See HPI    Objective:   Physical Exam BP 120/81  Pulse 92  Temp(Src) 98.2 F (36.8 C) (Oral)  Ht 5\' 3"  (1.6 m)  Wt 285 lb (129.275 kg)  BMI 50.5 kg/m2  SpO2 94%  LMP 12/16/2012 Gen: alert, cooperative, NAD HEENT: AT/[place], sclera white, MMM Neck: supple CV: RRR, no murmurs Pulm: CTAB, no wheezes or rales     Assessment & Plan:

## 2013-01-20 NOTE — Assessment & Plan Note (Signed)
Cough may be post-viral; however with the sputum production and recently quitting smoking, I am concerned for a bronchitis.  Will treat with doxy 100mg  BID x10s.  Refilled albuterol as well.  Discussed expected course with patient.  She has follow up with me next month.  Will return if worsening.

## 2013-01-22 ENCOUNTER — Other Ambulatory Visit: Payer: Self-pay | Admitting: Emergency Medicine

## 2013-01-22 DIAGNOSIS — F319 Bipolar disorder, unspecified: Secondary | ICD-10-CM

## 2013-01-22 MED ORDER — LURASIDONE HCL 40 MG PO TABS: 40.0000 mg | ORAL_TABLET | Freq: Every day | ORAL | Status: DC

## 2013-01-25 ENCOUNTER — Ambulatory Visit (INDEPENDENT_AMBULATORY_CARE_PROVIDER_SITE_OTHER): Payer: Medicaid Other | Admitting: Psychology

## 2013-01-25 DIAGNOSIS — F319 Bipolar disorder, unspecified: Secondary | ICD-10-CM

## 2013-01-25 NOTE — Assessment & Plan Note (Addendum)
Report of mood is euthymic today.  Affect is consistent.  I do not think she meets criteria for OCD.  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  She does not see it as a problem.    With regards to mood issues, working on moving past this relationship might help decrease irritability and recurrent depressed periods (she reported four days out of two weeks last MDC visit).  Looked at a couple of options including cutting off contact and forgiveness.  Ultimately, she decided an attempt to shift her angry feelings might be a good starting place.  She recognizes that when she treats him badly, that it hurts her more than him.    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

## 2013-01-25 NOTE — Progress Notes (Signed)
Carla Hanson presents for follow-up.  She has nothing for the agenda.  Reviewed the symptoms she reported last clinic with regards to cleanliness and need for order.  She does notice herself "counting" sometimes while cleaning - making sure she has wiped a surface a certain number of times.  She views her cleaning and organizing as a stress reliever and while clutter and messes stress her, she has not found that they interfere with her function in any appreciable way.  Focused next on the relationship with Carla Hanson that ended over a year ago.  Dated 8 months.  She was in love.  He was heavily influenced by his family and broke up with her rather suddenly over something that sounds very trivial.  She has struggled to make sense of this since this time and has maintained a relationship with him that she identifies as unhealthy but difficult to end.

## 2013-02-08 ENCOUNTER — Ambulatory Visit: Payer: Medicaid Other | Admitting: Psychology

## 2013-02-15 ENCOUNTER — Telehealth: Payer: Self-pay | Admitting: Emergency Medicine

## 2013-02-15 ENCOUNTER — Ambulatory Visit: Payer: Medicaid Other | Admitting: Psychology

## 2013-02-15 NOTE — Telephone Encounter (Signed)
Carla Hanson came in today thinking that her appointment with you was at 9:30.  I told her that it was in the computer for 11.  She said that she would not be able to stay for come back for that time.   She will need to reschedule it with you.

## 2013-02-15 NOTE — Telephone Encounter (Signed)
Carla Hanson's MDC appointment on Wednesday is scheduled for 9:30 so that is probably what she was thinking.  Called to discuss.  She says she will be able to attend Wednesday and will call back to reschedule therapy.

## 2013-03-02 ENCOUNTER — Encounter: Payer: Self-pay | Admitting: Emergency Medicine

## 2013-03-02 ENCOUNTER — Ambulatory Visit (INDEPENDENT_AMBULATORY_CARE_PROVIDER_SITE_OTHER): Payer: Medicaid Other | Admitting: Emergency Medicine

## 2013-03-02 VITALS — BP 141/83 | HR 78 | Ht 64.0 in | Wt 294.0 lb

## 2013-03-02 DIAGNOSIS — E669 Obesity, unspecified: Secondary | ICD-10-CM

## 2013-03-02 DIAGNOSIS — R7309 Other abnormal glucose: Secondary | ICD-10-CM

## 2013-03-02 DIAGNOSIS — E785 Hyperlipidemia, unspecified: Secondary | ICD-10-CM

## 2013-03-02 DIAGNOSIS — I10 Essential (primary) hypertension: Secondary | ICD-10-CM

## 2013-03-02 DIAGNOSIS — R7303 Prediabetes: Secondary | ICD-10-CM

## 2013-03-02 MED ORDER — METFORMIN HCL 500 MG PO TABS: 500.0000 mg | ORAL_TABLET | Freq: Two times a day (BID) | ORAL | Status: DC

## 2013-03-02 NOTE — Assessment & Plan Note (Signed)
Starting metformin for weight loss.

## 2013-03-02 NOTE — Assessment & Plan Note (Signed)
Will plan on rechecking lipids at follow up, especially as she is now on Jordan.

## 2013-03-02 NOTE — Patient Instructions (Addendum)
It was nice to see you! Please call the number to find out more about bariatric surgery. Start metformin 500mg  twice a day to help with weight loss. I will see you back in 1 month.

## 2013-03-02 NOTE — Assessment & Plan Note (Signed)
Mildly elevated today, but she has not taken her blood pressure medication yet this morning.  No changes.

## 2013-03-02 NOTE — Progress Notes (Signed)
  Subjective:    Patient ID: Carla Hanson, female    DOB: Mar 18, 1966, 47 y.o.   MRN: [id number]  HPI Carla Hanson is here for follow up.  Hypertension Well controlled: no Compliant with medication: yes - patient has not taken meds yet today Side effects from medication: no Check BP at home: no  Chest pain: no Palpitations: no Vision changes: no Leg edema: no Dizziness: no  Pre-Diabetes She reports being very concerned about this as diabetes does run in her family.  Obesity She has been trying to loose weight on her own for the last 6 months or so.  She has been working on her diet - cutting out sugary drinks and adding fruits/veggies.  Still does not do much in the way of exercise.  Has not made any progress.  She is aware that the Jordan will not make weight loss any easier, but it is working very well for her bipolar.  She is asking about Belviq to help jumpstart weight loss.  I have reviewed and updated the following as appropriate: allergies and current medications SHx: former smoker  Review of Systems See HPI    Objective:   Physical Exam BP 141/83  Pulse 78  Ht 5\' 4"  (1.626 m)  Wt 294 lb (133.358 kg)  BMI 50.44 kg/m2 Gen: alert, cooperative, NAD, obese HEENT: AT/[place], sclera white, MMM Neck: supple CV: RRR, no murmurs Pulm: CTAB, no wheezes or rales     Assessment & Plan:

## 2013-03-02 NOTE — Assessment & Plan Note (Signed)
Discussed options with patient including Belviq, metformin and bariatric surgery.  Best evidence for sustained weight loss is with surgery.  Belviq has some concerns particularly with her Latuda and possible serotonin syndrome.  After much discussion, she will attend a bariatric seminar for more information.  We will also start metformin 500mg  BID.  Follow up in 1 month.

## 2013-03-03 ENCOUNTER — Other Ambulatory Visit: Payer: Self-pay

## 2013-03-03 ENCOUNTER — Ambulatory Visit (INDEPENDENT_AMBULATORY_CARE_PROVIDER_SITE_OTHER): Payer: Medicaid Other | Admitting: Psychology

## 2013-03-03 DIAGNOSIS — F319 Bipolar disorder, unspecified: Secondary | ICD-10-CM

## 2013-03-03 DIAGNOSIS — Z1231 Encounter for screening mammogram for malignant neoplasm of breast: Secondary | ICD-10-CM

## 2013-03-03 NOTE — Progress Notes (Signed)
Carla Hanson presents for follow-up.  Her main complaint is difficulty with sleep.  She goes to bed between 9 and 11 and is back up anywhere from 2:00 - 4 or 5:00 a.m. And is up for for two hours or so.  She typically wakes up to use the bathroom but then can not readily fall back asleep.  Occasionally she is able to relax in bed but often she is aggravated about not being able to go back to sleep.  Denies watching television in bed or doing other things other than reading just prior to going to sleep.  This problem has been going on about a month.  In the last month, she has noted about 6 to 8 days of bothersome depressed mood.  She can't remember many details but does remember a recent 3 day period where there was no known trigger for increased agitation and irritability.  It was accompanied by racing thoughts, restlessness.  Her coping mechanism was to withdraw for fear that she might pick fights with others.  Her son was visiting at the time.    She is working on weight loss.

## 2013-03-03 NOTE — Patient Instructions (Addendum)
Thanks for coming in to see [place].  Please schedule a follow-up for: June 25th at 9:00 for [facility].  You can schedule a beh med appointment for:  May 13th at 9:00. Dr. Kathrynn Running thinks that we might be able to get a bit better mood control if you increase your dose to 60 mg.  It might also help the sleep problem that you described today.  If it feels like too much medicine, call me at [phone].  Always take this medicine with food to make sure it gets into your body the way you need it to. There are some very clear BEHAVIORS that you can change that will help you with your sleep.  I printed out a sleep hygiene sheet for you to review.  We can talk about it at your beh med follow-up. Please bring your journal to your next MDC.  If you can review beforehand to look for patterns, that would be great!

## 2013-03-03 NOTE — Assessment & Plan Note (Addendum)
Report of mood today is euthymic.  Affect is consistent.  She is brighter than she has been compared to earlier [facility] visits.  Thoughts are clear and goal directed.  She said she has worked on forgiveness - something we will pick up during her next therapy session.  She likely has some sleep hygiene to work on and accepted a handout on this.  Will take a closer look during next be med appointment.    Dr. Kathrynn Running did recommend increasing her dose of Latuda to 60 mg to see if we can greater efficacy.  See patient instructions for further plan.    25 minutes of face to face counseling with the patient.

## 2013-03-16 ENCOUNTER — Ambulatory Visit (INDEPENDENT_AMBULATORY_CARE_PROVIDER_SITE_OTHER): Payer: Medicaid Other | Admitting: Psychology

## 2013-03-16 DIAGNOSIS — F319 Bipolar disorder, unspecified: Secondary | ICD-10-CM

## 2013-03-16 NOTE — Assessment & Plan Note (Signed)
Mood is euthymic.  Affect is consistent.  Thought content is positive and realistic.  She has a plan to focus on herself for now (no dating, relationships etc...), get her GED and attend college.  She feels really "comfortable" with where she is right now.  When talking about emotional "storms" and how to manage them, she brought up some angry feelings about her ex husband.  She wants to devote her next session to working through this.

## 2013-03-16 NOTE — Patient Instructions (Addendum)
Great to see you today.  See your print out for your next appointments. I am happy to hear you are feeling well and taking good care of yourself.  Awesome work. We will plan to talk about your ex husband next visit.

## 2013-03-16 NOTE — Progress Notes (Signed)
Carla Hanson presents for follow-up.  She is taking 60 mg of Latuda and tolerating it well.  She thinks her sleep is better - noting one or two times in the last two weeks where she awoke and had difficulty getting back to sleep.  This is a definite improvement.  Last night she went to bed at 10:00 and did not awaken until a phone call at 6:30.  She thinks her mood is better as well.  She had 1-2 (but not 3) "episodes" where she felt irritable or angry in the last two weeks.  She used a number of coping strategies including self-talk to work her way out of it and she did.  She is walking three times a week.  She has cut off an unhealthy relationship completely and feels good about that.  She had a great mother's day.

## 2013-03-30 ENCOUNTER — Other Ambulatory Visit: Payer: Self-pay | Admitting: *Deleted

## 2013-03-30 MED ORDER — LISINOPRIL-HYDROCHLOROTHIAZIDE 20-25 MG PO TABS: 1.0000 | ORAL_TABLET | Freq: Every day | ORAL | Status: DC

## 2013-04-01 ENCOUNTER — Ambulatory Visit
Admission: RE | Admit: 2013-04-01 | Discharge: 2013-04-01 | Disposition: A | Discharge status: Home / Self Care | Payer: Medicaid Other | Source: Ambulatory Visit

## 2013-04-01 ENCOUNTER — Ambulatory Visit: Payer: Medicaid Other | Admitting: Emergency Medicine

## 2013-04-01 DIAGNOSIS — Z1231 Encounter for screening mammogram for malignant neoplasm of breast: Secondary | ICD-10-CM

## 2013-04-02 ENCOUNTER — Encounter: Payer: Self-pay | Admitting: Emergency Medicine

## 2013-04-02 ENCOUNTER — Ambulatory Visit (INDEPENDENT_AMBULATORY_CARE_PROVIDER_SITE_OTHER): Payer: Medicaid Other | Admitting: Psychology

## 2013-04-02 ENCOUNTER — Ambulatory Visit (INDEPENDENT_AMBULATORY_CARE_PROVIDER_SITE_OTHER): Payer: Medicaid Other | Admitting: Emergency Medicine

## 2013-04-02 VITALS — BP 147/82 | HR 75 | Ht 64.0 in | Wt 293.0 lb

## 2013-04-02 DIAGNOSIS — R7309 Other abnormal glucose: Secondary | ICD-10-CM

## 2013-04-02 DIAGNOSIS — R7303 Prediabetes: Secondary | ICD-10-CM

## 2013-04-02 DIAGNOSIS — E669 Obesity, unspecified: Secondary | ICD-10-CM

## 2013-04-02 DIAGNOSIS — F319 Bipolar disorder, unspecified: Secondary | ICD-10-CM

## 2013-04-02 DIAGNOSIS — E785 Hyperlipidemia, unspecified: Secondary | ICD-10-CM

## 2013-04-02 NOTE — Assessment & Plan Note (Signed)
Will recheck lipid profile at follow up in July as she is not fasting today.  Instructions on fasting provided to patient.

## 2013-04-02 NOTE — Assessment & Plan Note (Signed)
She is planning on doing the bariatric surgery seminar with her mother in the next month.  Doing well with metformin.  Weight down 1lb.  Continue metformin 500mg  BID.  Follow up in 2 months.

## 2013-04-02 NOTE — Patient Instructions (Addendum)
Please schedule a follow-up for:  June 17th at 9:00 You did some really hard work today.  You may feel a little low the rest of the day or not.  Sometimes after this kind of work, you feel more hurt or vulnerable.  You have opened up a wound so this makes sense.  Be gentle and kind with yourself today. We talked about ways that you could start to shift your feelings.  We talked about praying for Bernerd Pho and working on not scowling when you see her.  You may also want to talk to your daughter about boundaries.  I think you deserve that.

## 2013-04-02 NOTE — Progress Notes (Signed)
  Subjective:    Patient ID: Carla Hanson, female    DOB: [birth year], 47 y.o.   MRN: [id number]  HPI Carla Hanson is here for f/u obesity.  She reports tolerating the metformin well without GI upset.  She does think it is helping to suppress her appetite.  She continues to make gradual changes in her diet.  She has not gone to the bariatric surgery seminar yet, but is planning.  She is asking if it would be okay to bring her mom with her.  Wants to know if it is time to recheck her A1c for the pre-diabetes yet.  Also asking about cholesterol monitoring.   I have reviewed and updated the following as appropriate: allergies and current medications SHx: former smoker  Review of Systems See HPI    Objective:   Physical Exam BP 147/82  Pulse 75  Ht 5\' 4"  (1.626 m)  Wt 293 lb (132.904 kg)  BMI 50.27 kg/m2 Gen: alert, cooperative, NAD, obese HEENT: AT/[place], sclera white, MMM Neck: supple CV: RRR, no murmurs Pulm: CTAB, no wheezes or rales     Assessment & Plan:

## 2013-04-02 NOTE — Patient Instructions (Addendum)
It was nice to see you! Keep taking the metformin. We are checking some blood work today.  I will call you if anything is wrong, otherwise you will get a letter with the results in the mail in 1-2 weeks. Follow up in July.  Please come to the appointment fasting - nothing to eat or drink except water and black coffee for 8 hours.

## 2013-04-02 NOTE — Progress Notes (Signed)
Carla Hanson presents for follow-up.  Like she mentioned last time, she wished to talk about her anger about her ex husband.  She described intense anger directed at the daughter that her husband had with the woman he cheated with while they were still married.  The daughter visits her grandmother regularly - the grandmother lives across the parking lot from [place].  This was the focus of the visit.

## 2013-04-02 NOTE — Assessment & Plan Note (Signed)
Underneath the anger is a great deal of hurt and I tried to get at fear (of being unlovable, not worthy etc...).  The feeling of loss (loss of marriage, commitment) resonates with her more.  She has a good sense that this cheating was her husband's behavior and had little to do with her.  Looked at the feelings, thoughts and behaviors around this little girl and the effect her current approach has on [place].  The best way to describe it might be like a cancer - eating her alive.  She would like to move through it but is not confident she can.  I think she might need to act "as if" before her feelings can switch.  If she practices being more living, she may feel more loving.  This will be a process.  Scheduled for as quickly as I could get her back in which was June 17th at 9:00.  See patient instructions for further plan.

## 2013-04-02 NOTE — Assessment & Plan Note (Signed)
Will check A1c today. 

## 2013-04-20 ENCOUNTER — Ambulatory Visit (INDEPENDENT_AMBULATORY_CARE_PROVIDER_SITE_OTHER): Payer: Medicaid Other | Admitting: Psychology

## 2013-04-20 DIAGNOSIS — F319 Bipolar disorder, unspecified: Secondary | ICD-10-CM

## 2013-04-20 NOTE — Assessment & Plan Note (Signed)
Carla Hanson has noticed a shift in feeling since making these behavioral changes.  She believes she has a long way to go and that it will take some time.  Discussed the interaction between thoughts, feelings, behaviors and physiology.  Discussed continuing to intervene in the behavioral realm with the hopes of shifting these feelings more.  Short-term goals include praying for Bernerd Pho and saying hello to her.  Longer term goal is to have a conversation with her.    Recommended a book about bariatric surgery.  [name] reports no bothersome symptoms currently.  She is focused on getting out of the house more regularly (history museum for instance).  She is no longer cleaning in the middle of the night but does tend to clean a lot during the day so getting out of the house helps put her focus elsewhere.    Will follow on July 7th for Beh-med and June 25th for [facility].  Sooner if need be.

## 2013-04-20 NOTE — Progress Notes (Signed)
Carla Hanson presents for follow-up.  She does not have anything for the agenda.  I brought up her conversation with Dr. Elwyn Reach about Bariatric Surgery and we discussed briefly. Spent the bulk of the time revisiting the issue of how she interacts with the baby that her husband had with another woman.  She has made a little progress in that she has stopped scowling at the girl and is working on not referring to her as "that baby" but rather calling her by her name Carla Hanson).  She also talked with her daughter about not allowing Amaya in the home - at least for now.  Her daughter stated she understood her mother's feelings and respected the limit.    Also got some additional information about her family that I had forgotten or never got.  Carla Hanson has four kids and 8 grandkids:  Carla Hanson (5 kids), Carla Hanson (2 kids), Carla Hanson (1 daughter) and Carla Hanson (just turned 26).  She reports good relationships with all of her children and says she is especially close to [place].

## 2013-04-28 ENCOUNTER — Telehealth: Payer: Self-pay | Admitting: Psychology

## 2013-04-28 ENCOUNTER — Ambulatory Visit: Payer: Medicaid Other | Admitting: Psychology

## 2013-04-28 NOTE — Telephone Encounter (Signed)
Carla Hanson did not show for her appointment. I called to check on her.  She said she did not sleep at all last night and then overslept this morning.  She said this is happening about twice a week.  I have a beh med follow-up scheduled for 7/7.  She plans to attend and we can talk about a MDC follow-up then.  She has one refill left on her Latuda.

## 2013-05-10 ENCOUNTER — Ambulatory Visit (INDEPENDENT_AMBULATORY_CARE_PROVIDER_SITE_OTHER): Payer: Medicaid Other | Admitting: Psychology

## 2013-05-10 DIAGNOSIS — F319 Bipolar disorder, unspecified: Secondary | ICD-10-CM

## 2013-05-10 NOTE — Progress Notes (Signed)
Carla Hanson presents for follow-up.  She notes periods of aggravation lately.  She ties them to her son living in the house with her for the past few months.  He broke his ankle, is out of work and staying with her.  She will give him a certain amount of time after his ankle to find a job and then plans on making sure he is out of the house.  She says she is quite sensitive to smells and the smell of a man is overpowering to her and is aggravating.  She also continues to be aggravated by her house being out of order.  She continues to have about two days (occasionally three) where she is unable to sleep at night.  Other nights she is fine.  Reports she feels like she is wired on caffeine but is not.  Discussed how important it was to get better sleep and how confident she is she can get it.

## 2013-05-10 NOTE — Assessment & Plan Note (Signed)
Reports overall her mood is well managed.  Aggravation is tied to specific stress that she plans to manage soon.  Discussed ways to be less reactive to things that bother her but are not inherently damaging (smells, full trashcan etc...).  She is not at all confident that she can take a step back in the ways we discussed.  She has found going to her room, closing the door, breathing and doing some self-talk to be effective.    She is not interested in the sleep hygiene program I have available.  In her experience, the television helps her go to sleep.  Also, she has no "other place" to go to when she is not able to sleep.  Her living room is downstairs and walking up and down stairs in order to train her mind and body that the bed is for sleep is not something she is willing to do.  On nights she can not sleep, she plans to shut off the television and "relax" until she falls asleep.  She does not use alcohol or caffeine, states she does not eat a big meal close to bed, has a comfortable sleeping space.  She does not have a regular awake time however and will sleep to 10:00 or 11:00 on occasion.  Will continue to monitor.  Rescheduled for MDC for 8/20.  Will need a refill on Latuda which Dr. Kathrynn Running authorized and I called into her pharmacy.  Will follow-up in beh med on 7/30.

## 2013-05-12 ENCOUNTER — Ambulatory Visit (INDEPENDENT_AMBULATORY_CARE_PROVIDER_SITE_OTHER): Payer: Medicaid Other | Admitting: Emergency Medicine

## 2013-05-12 ENCOUNTER — Telehealth: Payer: Self-pay | Admitting: Psychology

## 2013-05-12 ENCOUNTER — Encounter: Payer: Self-pay | Admitting: Emergency Medicine

## 2013-05-12 VITALS — BP 133/84 | HR 77 | Temp 98.4°F | Resp 16 | Ht 64.0 in | Wt 289.2 lb

## 2013-05-12 DIAGNOSIS — E785 Hyperlipidemia, unspecified: Secondary | ICD-10-CM

## 2013-05-12 DIAGNOSIS — E669 Obesity, unspecified: Secondary | ICD-10-CM

## 2013-05-12 LAB — LIPID PANEL
Cholesterol: 200 mg/dL (ref 0–200)
Total CHOL/HDL Ratio: 4.9 Ratio
Triglycerides: 178 mg/dL — ABNORMAL HIGH (ref <150)
VLDL: 36 mg/dL (ref 0–40)

## 2013-05-12 NOTE — Telephone Encounter (Signed)
Called Carla Hanson to let her know that we will not have a [facility] appointment on August 20th.  Left a VM asking her to confirm an appointment for August 13th at 9:00 in [facility].

## 2013-05-12 NOTE — Progress Notes (Signed)
  Subjective:    Patient ID: Carla Hanson, female    DOB: 1966/01/17, 47 y.o.   MRN: [id number]  HPI Carla Hanson is here for f/u obesity.  She reports doing well.  Thinks the metformin really helps with appetite control.  She states she is eating "everything," but smaller portions.  Does occasionally eat some fried chicken.  Currently working on increasing the number of vegetables in her diet.  She has not been to the bariatric surgery seminar as the current system is working well for her.  I have reviewed and updated the following as appropriate: allergies and current medications SHx: former smoker  Review of Systems See HPI    Objective:   Physical Exam BP 133/84  Pulse 77  Temp(Src) 98.4 F (36.9 C)  Resp 16  Ht 5\' 4"  (1.626 m)  Wt 289 lb 3.2 oz (131.18 kg)  BMI 49.62 kg/m2  SpO2 99% Gen: alert, cooperative, NAD HEENT: AT/[place], sclera white, MMM Neck: supple CV: RRR, no murmurs Pulm: CTAB, no wheezes or rales     Assessment & Plan:

## 2013-05-12 NOTE — Assessment & Plan Note (Signed)
Down 4lbs in 2 months.  Continue metformin 500mg  BID.  Agree with deferring bariatric surgery as lifestyle changes are working.

## 2013-05-12 NOTE — Assessment & Plan Note (Signed)
Will check fasting lipid panel today as she is on latuda.

## 2013-05-12 NOTE — Patient Instructions (Addendum)
It was nice to see you! You are doing excellent with weight loss! We are checking some blood work today.  I will call you if anything is wrong, otherwise you will get a letter with the results in the mail in 1-2 weeks. I will see you back in about 3 months to touch base with the weight loss again.

## 2013-05-12 NOTE — Progress Notes (Signed)
Patient is here for follow up and lab work

## 2013-05-14 ENCOUNTER — Encounter: Payer: Self-pay | Admitting: Emergency Medicine

## 2013-06-02 ENCOUNTER — Ambulatory Visit (INDEPENDENT_AMBULATORY_CARE_PROVIDER_SITE_OTHER): Payer: Medicaid Other | Admitting: Psychology

## 2013-06-02 DIAGNOSIS — F319 Bipolar disorder, unspecified: Secondary | ICD-10-CM

## 2013-06-02 NOTE — Progress Notes (Signed)
Carla Hanson presents for follow-up.  She reports she has menstrual cramps and a headache.  In addition, she reports about 3 weeks of irritable mood.  Sleep remains a problem.  No change in medications.  Her 47 year old son who is currently living with her keeps getting in trouble.  He is currently in jail but will likely get out and come back to her house.  This is a great source of irritation for her (him being in her "comfort zone" and him not getting his life in order).

## 2013-06-02 NOTE — Assessment & Plan Note (Signed)
Mood is reported as irritable and affect is consistent.  She looks angry and acts irritable with me although she is able to take a step back occasionally.  Thoughts seem mildly grandiose.  She is quite resistant to change right now - not interested in changing behaviors with the hope of shifting mood.  She says she has a plan to get her son out of the house in three months time or less.  If he is a trigger for her irritability - this might be too long of a time frame.  Denied safety issues (both SI and HI).  Stopped going to school but plans to restart.  Not interested in moving her body.  Might consider turning off the television (currently watching all day).  Not interested in sleep hygiene.  She reported it feels better to vent her feelings and that was all she came to do.  She ended the session early and wanted to schedule again in a few weeks.  Scheduled for:  August 25th at 9:00.  She has to have a 9:00 appointment secondary to transportation and this was the first available.  Will see in [facility] on August 13th.

## 2013-06-15 ENCOUNTER — Telehealth: Payer: Self-pay | Admitting: Psychology

## 2013-06-15 NOTE — Telephone Encounter (Signed)
Called Carla Hanson to let her know Dr. Kathrynn Running wouldn't be present for her [facility] appointment tomorrow.  Gave her options to see me and I would consult with him or to reschedule.  She elected to reschedule for next available which was 9/17 at 9:30.  I am scheduled to see her in [facility] on August 25th.

## 2013-06-16 ENCOUNTER — Ambulatory Visit: Payer: Medicaid Other | Admitting: Psychology

## 2013-06-23 ENCOUNTER — Ambulatory Visit: Payer: Medicaid Other | Admitting: Psychology

## 2013-06-28 ENCOUNTER — Ambulatory Visit (INDEPENDENT_AMBULATORY_CARE_PROVIDER_SITE_OTHER): Payer: Medicaid Other | Admitting: Psychology

## 2013-06-28 DIAGNOSIS — F319 Bipolar disorder, unspecified: Secondary | ICD-10-CM

## 2013-06-28 NOTE — Progress Notes (Signed)
Carla Hanson presents for follow-up.  She reports that her son has gotten a job and she hopes this will lead to him moving out soon.  Other news is that her 47 year old daughter is about [redacted] weeks pregnant.  Carla Hanson and she have talked and have developed a plan.  Carla Hanson is still feeling a bit unsettled about the situation but overall thinks her daughter will manage and that she will help in specific ways.  She is very angry with Berna Spare, the father of the baby and was hesitant to talk about her feelings in this regard.  Carla Hanson's main concern is difficulty with sleep.  She has tried turning off the television without benefit.  She has difficulty 3-4 nights a week and at the most on these nights, gets 4 hours of sleep.  Naps occasionally during the day for an hour.    She says she is currently taking a "break" and "doing nothing" and that this is a good thing.  She says this is a change from going to school and worrying about everyone - where her head would get crowded and she would feel overwhelmed.  This "doing nothing" feels good to her.

## 2013-06-28 NOTE — Assessment & Plan Note (Signed)
Report of mood is mildly irritable.  Less so than last time.  She does not report being anhedonic.  Function appears to be fine but perhaps because she is not pushing herself to do anything.  She believes this is a healthy choice for her at this time.  Discussed issue of denial vs. Knowing her body and mind well and respecting it.  She feels like she is doing what she needs to do right now to handle the stressors in her life.    Discussed possibility of looking at medication option to see if she could get some better sleep.  Will discuss at [facility] appointment.  She elected to schedule the week after her MDC follow-up:  Sept 23rd at 9:00.

## 2013-07-09 ENCOUNTER — Other Ambulatory Visit: Payer: Self-pay | Admitting: Emergency Medicine

## 2013-07-09 MED ORDER — LISINOPRIL-HYDROCHLOROTHIAZIDE 20-25 MG PO TABS: 1.0000 | ORAL_TABLET | Freq: Every day | ORAL | Status: DC

## 2013-07-21 ENCOUNTER — Ambulatory Visit: Payer: Medicaid Other | Admitting: Psychology

## 2013-07-21 ENCOUNTER — Telehealth: Payer: Self-pay | Admitting: Psychology

## 2013-07-21 NOTE — Telephone Encounter (Signed)
Carla Hanson left a VM this morning canceling her MDC appointment secondary to her mother having emergency surgery.  She asked that I call her back to reschedule.  I did and had to leave a VM.

## 2013-07-27 ENCOUNTER — Ambulatory Visit: Payer: Medicaid Other | Admitting: Psychology

## 2013-08-13 ENCOUNTER — Telehealth: Payer: Self-pay | Admitting: Psychology

## 2013-08-13 NOTE — Telephone Encounter (Signed)
Carla Hanson called to reschedule her [facility] appointment that she missed secondary to her mom's emergency surgery.  Rescheduled for 10/29 at 11:00.  Needs refill on Latuda.  Dr. Kathrynn Running authorized.  Called in Latuda 40 mg #30 with no refills to [facility].

## 2013-08-25 ENCOUNTER — Ambulatory Visit: Payer: Medicaid Other | Admitting: Emergency Medicine

## 2013-09-01 ENCOUNTER — Ambulatory Visit (INDEPENDENT_AMBULATORY_CARE_PROVIDER_SITE_OTHER): Payer: Medicaid Other | Admitting: Psychology

## 2013-09-01 DIAGNOSIS — F319 Bipolar disorder, unspecified: Secondary | ICD-10-CM

## 2013-09-01 NOTE — Patient Instructions (Signed)
Please schedule a follow-up for:  11/26 at 10:30. We discussed your mood and sleep today.  Dr. Kathrynn Running thinks that you had a hypomanic episode this past week and that you need changes in your medication.  We discussed a few different options including increasing Latuda - the medicine that you think has been helpful.  Increase this medicine to 60 mg.  You can take one and a half pills of the pills you have at home and then fill the new prescription. Dr. Kathrynn Running also thinks trying a sleep medicine is reasonable.  He prescribed Trazodone which is an antidepressant that makes people sleepy.  Take 50 mg at bedtime.   Call me at [phone] if you have any difficulty.  Also - call me if you want to get back into therapy.

## 2013-09-01 NOTE — Progress Notes (Signed)
Carla Hanson presents for follow-up.  She was last seen in [facility] in mid-May.  She has followed up a few times in [facility] since then.  Today she reports she is in a "nasty mood" and is sleep deprived.  She had a period last week lasting at least three days when she was focused on cleaning her house and prepping it to be painted.  She had a decreased need for sleep, increased energy, increased goal directed activity and more focus.  After this, she said she "crashed."    She is interested in sleeping better and getting better mood management.  She thinks Jordan has been helpful.  She was taking 60 mg at one point and remembers being told to bring it back to 40 mg (not sure when that happened).  Discussed possible sleeping medications.

## 2013-09-01 NOTE — Assessment & Plan Note (Addendum)
Report of mood is irritable.  Affect is consistent.  History consistent with a recent hypomanic episode.  Mood overall is not well managed.  Discussed options.  Agreed to bump Latuda to 60 mg to see if she can get more benefit.  Also added Trazodone to hopefully help her sleep.  She was very resistant to the idea of wearing a CPAP.  Will continue to think about this but for now decided to defer a recommendation for a sleep study because she is not interested in the treatment.  Because sleep apnea might be linked to a number of her issues and she has been noted to snore loudly (daughter's report), it might be prudent to revisit.    Near the end of our meeting, she asked about follow-up for therapy.  Asked her to call me to schedule.    30 minutes of face to face counseling.

## 2013-09-06 ENCOUNTER — Encounter: Payer: Self-pay | Admitting: Emergency Medicine

## 2013-09-06 ENCOUNTER — Ambulatory Visit: Payer: Medicaid Other | Admitting: Emergency Medicine

## 2013-09-16 ENCOUNTER — Ambulatory Visit: Payer: Medicaid Other | Admitting: Emergency Medicine

## 2013-09-21 ENCOUNTER — Emergency Department (HOSPITAL_COMMUNITY)
Admission: EM | Admit: 2013-09-21 | Discharge: 2013-09-21 | Disposition: A | Discharge status: Home / Self Care | Payer: Medicaid Other | Attending: Emergency Medicine | Admitting: Emergency Medicine

## 2013-09-21 ENCOUNTER — Encounter (HOSPITAL_COMMUNITY): Payer: Self-pay | Admitting: Emergency Medicine

## 2013-09-21 DIAGNOSIS — Z87898 Personal history of other specified conditions: Secondary | ICD-10-CM | POA: Insufficient documentation

## 2013-09-21 DIAGNOSIS — F17200 Nicotine dependence, unspecified, uncomplicated: Secondary | ICD-10-CM

## 2013-09-21 DIAGNOSIS — M79609 Pain in unspecified limb: Secondary | ICD-10-CM

## 2013-09-21 DIAGNOSIS — Z79899 Other long term (current) drug therapy: Secondary | ICD-10-CM | POA: Insufficient documentation

## 2013-09-21 DIAGNOSIS — E669 Obesity, unspecified: Secondary | ICD-10-CM | POA: Insufficient documentation

## 2013-09-21 DIAGNOSIS — I82402 Acute embolism and thrombosis of unspecified deep veins of left lower extremity: Secondary | ICD-10-CM

## 2013-09-21 DIAGNOSIS — I82409 Acute embolism and thrombosis of unspecified deep veins of unspecified lower extremity: Secondary | ICD-10-CM | POA: Insufficient documentation

## 2013-09-21 DIAGNOSIS — I10 Essential (primary) hypertension: Secondary | ICD-10-CM | POA: Insufficient documentation

## 2013-09-21 DIAGNOSIS — F411 Generalized anxiety disorder: Secondary | ICD-10-CM | POA: Insufficient documentation

## 2013-09-21 DIAGNOSIS — F319 Bipolar disorder, unspecified: Secondary | ICD-10-CM | POA: Insufficient documentation

## 2013-09-21 MED ORDER — RIVAROXABAN 15 MG PO TABS: 15.0000 mg | ORAL_TABLET | Freq: Two times a day (BID) | ORAL | Status: DC

## 2013-09-21 MED ORDER — RIVAROXABAN (XARELTO) EDUCATION KIT FOR DVT/PE PATIENTS
PACK | Freq: Once | Status: AC
  Administered 2013-09-21: 20:00:00
  Filled 2013-09-21: qty 1

## 2013-09-21 MED ORDER — RIVAROXABAN 15 MG PO TABS
15.0000 mg | ORAL_TABLET | Freq: Two times a day (BID) | ORAL | Status: DC
  Administered 2013-09-21: 15 mg via ORAL
  Filled 2013-09-21 (×2): qty 1

## 2013-09-21 NOTE — ED Provider Notes (Signed)
CSN: [id number]     Arrival date & time 09/21/13  1604 History   First MD Initiated Contact with Patient 09/21/13 1709     Chief Complaint  Patient presents with  . Leg Pain   (Consider location/radiation/quality/duration/timing/severity/associated sxs/prior Treatment) The history is provided by the patient and medical records. No language interpreter was used.    Carla Hanson is a 47 y.o. female  with a hx of DVT, HTN presents to the Emergency Department complaining of gradual, persistent, progressively worsening L calf pain onset 3 days ago.  Pt reports she has aching, tenderness and soreness in the posterior calf of her left leg.  Pt denies falling or known injury to the leg.  She denies new or strenuous activities. Associated symptoms include left leg swelling.  Pt reports taking 2 tylenol this AM which resolved the pain but it has since returned.  Walking makes it worse.  Pt denies fever, chills, headache, neck pain, chest pain, SOB, abd pain, N/V/D, weakness, dizziness, syncope.   Pt reports DVT of the right leg after breaking her right ankle last year;  Treated with Xarelto which was d/c in July 2014.  No recent surgery, no long car trips, no exogenous estrogen; she does admit to returning to smoking in the last several months.     Past Medical History  Diagnosis Date  . Hypertension   . Lipoma     Abdomen  . Obesity   . Hyperlipidemia   . Tobacco abuse   . LIPOMA 01/20/2008  . Anxiety   . Bipolar disorder    Past Surgical History  Procedure Laterality Date  . Lipoma removed  03/2011  . Cesarean section    . Orif ankle fracture  03/13/2012    Procedure: OPEN REDUCTION INTERNAL FIXATION (ORIF) ANKLE FRACTURE;  Surgeon: Kerrin Champagne, MD;  Location: [facility];  Service: Orthopedics;  Laterality: Right;   Family History  Problem Relation Age of Onset  . Asthma Father    History  Substance Use Topics  . Smoking status: Former Smoker -- 0.30 packs/day for 25 years    Types:  Cigarettes    Quit date: 10/05/2012  . Smokeless tobacco: Never Used     Comment: in process of quitting on Chantix  . Alcohol Use: Yes     Comment: 2 beer / week   OB History   Grav Para Term Preterm Abortions TAB SAB Ect Mult Living                 Review of Systems  Constitutional: Negative for fever, chills, diaphoresis, appetite change, fatigue and unexpected weight change.  HENT: Negative for mouth sores.   Eyes: Negative for visual disturbance.  Respiratory: Negative for cough, chest tightness and shortness of breath.   Cardiovascular: Negative for chest pain.  Gastrointestinal: Negative for nausea, vomiting, abdominal pain, diarrhea and constipation.  Endocrine: Negative for polydipsia, polyphagia and polyuria.  Genitourinary: Negative for dysuria, urgency, frequency and hematuria.  Musculoskeletal: Positive for arthralgias, joint swelling and myalgias. Negative for back pain, neck pain and neck stiffness.  Skin: Negative for rash and wound.  Allergic/Immunologic: Negative for immunocompromised state.  Neurological: Negative for syncope, light-headedness, numbness and headaches.  Hematological: Does not bruise/bleed easily.  Psychiatric/Behavioral: Negative for sleep disturbance. The patient is not nervous/anxious.   All other systems reviewed and are negative.    Allergies  Review of patient's allergies indicates no known allergies.  Home Medications   Current Outpatient Rx  Name  Route  Sig  Dispense  Refill  . acetaminophen (TYLENOL) 500 MG tablet   Oral   Take 1,000 mg by mouth every 6 (six) hours as needed.         Marland Kitchen lisinopril-hydrochlorothiazide (PRINZIDE,ZESTORETIC) 20-25 MG per tablet   Oral   Take 1 tablet by mouth daily.   30 tablet   4   . Lurasidone HCl (LATUDA) 60 MG TABS   Oral   Take 60 mg by mouth at bedtime.         . traZODone (DESYREL) 50 MG tablet   Oral   Take 50 mg by mouth at bedtime. Take one pill at night to help with sleep.   #30 with 5 refills per Dr. Kathrynn Running.         Marland Kitchen EXPIRED: lurasidone (LATUDA) 40 MG TABS   Oral   Take 40 mg by mouth daily. Per Dr. Kathrynn Running in [facility].  #30 with no refills to ensure she has enough medicine to make it to her [facility] appointment.         Marland Kitchen EXPIRED: Lurasidone HCl (LATUDA) 60 MG TABS   Oral   Take 60 mg by mouth as directed. Per Dr. Kathrynn Running in [facility].  Take once daily with food.  #30 with 2 refills         . Rivaroxaban (XARELTO) 15 MG TABS tablet   Oral   Take 1 tablet (15 mg total) by mouth 2 (two) times daily. For 3 weeks then completion per PCP   42 tablet   0    BP 165/79  Pulse 82  Temp(Src) 98.7 F (37.1 C) (Oral)  SpO2 99%  LMP 09/19/2013 Physical Exam  Nursing note and vitals reviewed. Constitutional: She appears well-developed and well-nourished. No distress.  Awake, alert, nontoxic appearance Obese   HENT:  Head: Normocephalic and atraumatic.  Mouth/Throat: Oropharynx is clear and moist. No oropharyngeal exudate.  Eyes: Conjunctivae are normal. No scleral icterus.  Neck: Normal range of motion. Neck supple.  Cardiovascular: Normal rate, regular rhythm, normal heart sounds and intact distal pulses.   No murmur heard. Capillary refill < 3 sec  Pulmonary/Chest: Effort normal and breath sounds normal. No respiratory distress. She has no wheezes.  Abdominal: Soft. Bowel sounds are normal. She exhibits no mass. There is no tenderness. There is no rebound and no guarding.  Musculoskeletal: Normal range of motion. She exhibits tenderness. She exhibits no edema.  ROM: Full ROM of all joints of the LLE;  No visible swelling or effusion of the knee (exam is difficulty 2/2 patient obesity) Pain to palpation of the L calf and popliteal space Mild swelling of the calf without pitting edema   Neurological: She is alert. Coordination normal.  Sensation intact to dull and sharp in the bilateral lower extremities Strength 5/5 in the bilateral LE  Skin: Skin is warm and  dry. No rash noted. She is not diaphoretic. No erythema.  No erythema or induration  Prominent varicosities of the LLE - pt reports these are normal for her  Psychiatric: She has a normal mood and affect.    ED Course  Procedures (including critical care time) Labs Review Labs Reviewed - No data to display Imaging Review No results found.  EKG Interpretation   None       MDM   1. DVT of leg (deep venous thrombosis), left   2. Smoking addiction      Carla Hanson presents with LLE calf pain and hx of  DVT; suspicious of same tonight.  Pt has been off her Xarelto for 4 mos.  Will obtain Venous duplex.  Vascular Ultrasound  Left lower extremity venous duplex has been completed. Preliminary findings: DVT involving the left popliteal and posterior tibial veins.  7:28 PM Discussed with patient anticoagulation options and she prefers to return to the Xarelto.  Will dose here in the ED and d/c home with RX.  Pt has an appointment with her PCP in 1 week for follow-up.  Pt without tachycardia, CP, SOB, DOE, hypoxia or hemoptysis to indicate concern for PE.    It has been determined that no acute conditions requiring further emergency intervention are present at this time. The patient/guardian have been advised of the diagnosis and plan. We have discussed signs and symptoms that warrant return to the ED, such as changes or worsening in symptoms.   Vital signs are stable at discharge.   BP 165/79  Pulse 82  Temp(Src) 98.7 F (37.1 C) (Oral)  SpO2 99%  LMP 09/19/2013  Patient/guardian has voiced understanding and agreed to follow-up with the PCP or specialist.       Dierdre Forth, PA-C 09/21/13 1935

## 2013-09-21 NOTE — ED Notes (Signed)
Pt states R calf pain x 3 days. No memory of injury. Hx of blood clot in R leg that developed into PE before. Denies SOB.

## 2013-09-21 NOTE — Progress Notes (Signed)
*  PRELIMINARY RESULTS* Vascular Ultrasound Left lower extremity venous duplex has been completed.  Preliminary findings: DVT involving the left popliteal and posterior tibial veins.  Gave verbal results to [place], [place].    Farrel Demark, RDMS, RVT  09/21/2013, 6:29 PM

## 2013-09-22 NOTE — ED Provider Notes (Signed)
Medical screening examination/treatment/procedure(s) were performed by non-physician practitioner and as supervising physician I was immediately available for consultation/collaboration.  EKG Interpretation   None        Raeford Razor, MD 09/22/13 1436

## 2013-09-29 ENCOUNTER — Ambulatory Visit: Payer: Medicaid Other | Admitting: Psychology

## 2013-09-29 ENCOUNTER — Encounter: Payer: Self-pay | Admitting: Emergency Medicine

## 2013-09-29 ENCOUNTER — Ambulatory Visit (INDEPENDENT_AMBULATORY_CARE_PROVIDER_SITE_OTHER): Payer: Medicaid Other | Admitting: Emergency Medicine

## 2013-09-29 VITALS — BP 122/73 | HR 64 | Temp 97.9°F | Ht 64.0 in | Wt 284.0 lb

## 2013-09-29 DIAGNOSIS — I82402 Acute embolism and thrombosis of unspecified deep veins of left lower extremity: Secondary | ICD-10-CM | POA: Insufficient documentation

## 2013-09-29 DIAGNOSIS — E669 Obesity, unspecified: Secondary | ICD-10-CM

## 2013-09-29 DIAGNOSIS — F17200 Nicotine dependence, unspecified, uncomplicated: Secondary | ICD-10-CM

## 2013-09-29 DIAGNOSIS — I82409 Acute embolism and thrombosis of unspecified deep veins of unspecified lower extremity: Secondary | ICD-10-CM

## 2013-09-29 HISTORY — DX: Acute embolism and thrombosis of unspecified deep veins of left lower extremity: I82.402

## 2013-09-29 MED ORDER — RIVAROXABAN 20 MG PO TABS: 20.0000 mg | ORAL_TABLET | Freq: Every day | ORAL | Status: DC

## 2013-09-29 NOTE — Patient Instructions (Signed)
It was nice to see you!  I'm sorry you got another blood clot. Take all the pills you got in the ER. Once those run out, start taking Xarelto 20mg  tablets 1 pill once a day.  I will send this prescription to your pharmacy.  You should really be on xarelto for the rest of your life to prevent future blood clots. Think about this.   If you really do not want to be on lifelong xarelto, we could consider doing a big work up to find out if you have a genetic predisposition to form clots.    Follow up in 3 months.

## 2013-09-29 NOTE — Progress Notes (Signed)
   Subjective:    Patient ID: Carla Hanson, female    DOB: 05/29/1966, 47 y.o.   MRN: [id number]  HPI LATASHA BUCZKOWSKI is here for f/u ED.  ED f/u - left leg DVT She states that about 10 days ago she developed swelling in the left lower leg and pain in the calf.  She went to the ER on 11/17 where she was diagnosed with a left leg DVT.  She was started on xarelto 15mg  BID x21 days and instructed to f/u with PCP.  She states that prior to developing this DVT she had restarted smoking.  States that since being on xarelto the swelling and pain have improved but not resolved.  Tobacco Abuse Discussed risks of tobacco use.  Patient states that she has quit again since diagnosis with DVT.  She is very motivated to stay quit this time.  Obesity She continues to try and eat healthy and get exercise.  She is walking every day - this is motivated by wanting to "keep her blood moving" to prevent additional clots.  I have reviewed and updated the following as appropriate: allergies, current medications and problem list SHx: former smoker (quit again 1 week ago)  Health Maintenance: declined flu shot; will need pap smear at f/u   Review of Systems See HPI    Objective:   Physical Exam BP 122/73  Pulse 64  Temp(Src) 97.9 F (36.6 C) (Oral)  Ht 5\' 4"  (1.626 m)  Wt 284 lb (128.822 kg)  BMI 48.72 kg/m2  LMP 09/19/2013 Gen: alert, cooperative, NAD HEENT: AT/[place], sclera white, MMM Neck: supple Ext: 1+ edema on left to mid shin; no calf tenderness      Assessment & Plan:

## 2013-09-29 NOTE — Assessment & Plan Note (Signed)
Continues with lifestyle changes. Weight down another 5lbs in the last 3 months. Provided encouragement.

## 2013-09-29 NOTE — Assessment & Plan Note (Signed)
Counseling given. Patient very motivated to stay quit this time.

## 2013-09-29 NOTE — Assessment & Plan Note (Signed)
Repeat DVT. Discussed smoking cessation - patient had quit then restarted, now quit again. On xarelto 15mg  BID for now; prescription for 20mg  tablets once daily sent when completed 21 days of BID dosing. Discussed life long anticoagulation.  She is reluctant to committee to this. Consider thrombophilia work up at f/u in 3 months.

## 2013-10-04 ENCOUNTER — Ambulatory Visit: Payer: Medicaid Other | Admitting: Emergency Medicine

## 2013-10-05 ENCOUNTER — Telehealth: Payer: Self-pay | Admitting: Psychology

## 2013-10-05 NOTE — Telephone Encounter (Signed)
Carla Hanson called last week to cancel her [facility] appointment.  She left a VM and asked that I call her to reschedule.  I called and left a VM.

## 2013-10-18 ENCOUNTER — Telehealth: Payer: Self-pay | Admitting: Psychology

## 2013-10-18 NOTE — Telephone Encounter (Signed)
Carma called to schedule a follow-up [facility].  First available was January 21st.  We scheduled for 10:30.  I asked her to call if she is unable to attend her appointment.

## 2013-11-24 ENCOUNTER — Ambulatory Visit (INDEPENDENT_AMBULATORY_CARE_PROVIDER_SITE_OTHER): Payer: Medicaid Other | Admitting: Psychology

## 2013-11-24 DIAGNOSIS — F319 Bipolar disorder, unspecified: Secondary | ICD-10-CM

## 2013-11-24 NOTE — Patient Instructions (Signed)
Please schedule a follow-up for:  February 18th at 10:30. Dr. Tammi Klippel recommended starting a medicine called Lamictal.  Take 25 mg for one week and then two pills (50 mg) until we see you.  Most common side effect is a headache.  If you notice a rash and / or have flu like symptoms, please let us know at [phone].  Although it would be rare to get a rash, it can be very serious.   Consider the 5 minute rule on days when you think you can force yourself to move a little.  It can be very effective in helping you feel better in some situations.

## 2013-11-24 NOTE — Assessment & Plan Note (Addendum)
Bipolar condition is not optimally managed although patient is reporting less irritability than usual (about 1/2 as much).  Discussed options that would hopefully put a floor on the depression that might also bring her hypomanic symptoms more to normal as well.  Dr. Tammi Klippel recommended Lamictal.  Patient was in agreement with adding this medication.  AE discussed.     See patient instructions for further plan.  30 minutes of face to face contact.

## 2013-11-24 NOTE — Progress Notes (Signed)
Carla Hanson presents for follow-up.  Last appointment was 08/2013 with a few cancellations in between.  She reports she has continued to take her medicine and overall is doing reasonably well with less irritability.  Upon further questioning, she reports a pattern that includes 2-3 days of significantly impairing depressed mood followed by  3-4 days of "cleaning mode" where she is driven with energy to primarily clean but also cook, talk etc..Marland KitchenShe notes a more active mind and decreased need for sleep during this period.  After this, she notes about a week's period of time where she is "chill" or normal.  She would like the depressive periods to go away and is ambivalent about losing the cleaning modes.  Functionally, she reports her mood is impairing to exercise although she does not exercise regularly when in a normal mood.  Does not cite any other type of impairment although she has reported relationship difficulty in the past.    Son has moved out.  Has a positive relationship with her daughter that lives with her.  She is 6 months pregnant and having a boy.    Carla Hanson is taking 60 mg of Latuda and 50 mg of Trazodone.

## 2013-11-30 ENCOUNTER — Telehealth: Payer: Self-pay | Admitting: Psychology

## 2013-11-30 NOTE — Telephone Encounter (Signed)
Carla Hanson called reporting she is on Xarelto and wanted to make sure it was okay to take Lamictal - the medicine Dr. Tammi Klippel most recently prescribed in [facility].  Checked with a medical preceptor Adrian Blackwater) who looked it up and said there were no identified drug-drug interactions.  Called patient back and let her know.

## 2013-12-22 ENCOUNTER — Ambulatory Visit: Payer: Medicaid Other | Admitting: Psychology

## 2013-12-29 ENCOUNTER — Encounter: Payer: Self-pay | Admitting: Emergency Medicine

## 2013-12-29 ENCOUNTER — Ambulatory Visit (INDEPENDENT_AMBULATORY_CARE_PROVIDER_SITE_OTHER): Payer: Medicaid Other | Admitting: Emergency Medicine

## 2013-12-29 VITALS — BP 112/70 | HR 77 | Temp 98.7°F | Wt 296.0 lb

## 2013-12-29 DIAGNOSIS — I82402 Acute embolism and thrombosis of unspecified deep veins of left lower extremity: Secondary | ICD-10-CM

## 2013-12-29 DIAGNOSIS — I82409 Acute embolism and thrombosis of unspecified deep veins of unspecified lower extremity: Secondary | ICD-10-CM

## 2013-12-29 DIAGNOSIS — R300 Dysuria: Secondary | ICD-10-CM

## 2013-12-29 DIAGNOSIS — R1011 Right upper quadrant pain: Secondary | ICD-10-CM

## 2013-12-29 LAB — COMPLETE METABOLIC PANEL WITH GFR
ALT: 8 U/L (ref 0–35)
AST: 8 U/L (ref 0–37)
Albumin: 3.8 g/dL (ref 3.5–5.2)
Alkaline Phosphatase: 50 U/L (ref 39–117)
BILIRUBIN TOTAL: 0.3 mg/dL (ref 0.2–1.2)
BUN: 8 mg/dL (ref 6–23)
CHLORIDE: 100 meq/L (ref 96–112)
CO2: 26 mEq/L (ref 19–32)
Calcium: 9.3 mg/dL (ref 8.4–10.5)
Creat: 0.65 mg/dL (ref 0.50–1.10)
GFR, Est African American: >89 mL/min
GLUCOSE: 98 mg/dL (ref 70–99)
Potassium: 3.6 mEq/L (ref 3.5–5.3)
Sodium: 135 mEq/L (ref 135–145)
Total Protein: 7.8 g/dL (ref 6.0–8.3)

## 2013-12-29 LAB — POCT URINALYSIS DIPSTICK
Bilirubin, UA: NEGATIVE
GLUCOSE UA: NEGATIVE
Ketones, UA: NEGATIVE
Leukocytes, UA: NEGATIVE
NITRITE UA: NEGATIVE
Protein, UA: NEGATIVE
Spec Grav, UA: 1.015
Urobilinogen, UA: 0.2
pH, UA: 6

## 2013-12-29 LAB — POCT UA - MICROSCOPIC ONLY

## 2013-12-29 MED ORDER — CEPHALEXIN 500 MG PO CAPS: 500.0000 mg | ORAL_CAPSULE | Freq: Three times a day (TID) | ORAL | Status: DC

## 2013-12-29 NOTE — Progress Notes (Signed)
   Subjective:    Patient ID: Carla Hanson, female    DOB: 1966/07/31, 48 y.o.   MRN: [id number]  HPI KALEISHA BHARGAVA is here for f/u blood clot, dysuria and abdominal pain.  Left leg DVT Doing well on xarelto.  She is still not convinced that she will need life long anticoagulation.  She would like to go ahead with testing for hypercoagulable states to help make a final decision.  On the plus side, she has quit smoking!  Dysuria Present for 3-4 days.  Pain with urination and suprapubic pain.  +urgency.  No hematuria.  Subjective fever.  No flank pain.  Has been drinking lots of water and cranberry juice.  RUQ pain She reports intermittent RUQ pain over the last few months.  State is is a dull ache and sometimes will be a little tender.  Tends to occur after meals; worse with fatty foods.  No nausea or vomiting.  She has no prior history of gallbladder problems, but she has gained weight since she quit smoking.  Current Outpatient Prescriptions on File Prior to Visit  Medication Sig Dispense Refill  . acetaminophen (TYLENOL) 500 MG tablet Take 1,000 mg by mouth every 6 (six) hours as needed.      . lamoTRIgine (LAMICTAL) 25 MG tablet Take 25 mg by mouth as directed. Per Dr. Tammi Klippel in [facility].  Take 25 mg daily for one week and then two a day (50 mg) until we see you again.  #60 with one refill.      Marland Kitchen lisinopril-hydrochlorothiazide (PRINZIDE,ZESTORETIC) 20-25 MG per tablet Take 1 tablet by mouth daily.  30 tablet  4  . lurasidone (LATUDA) 40 MG TABS Take 40 mg by mouth daily. Per Dr. Tammi Klippel in [facility].  #30 with no refills to ensure she has enough medicine to make it to her [facility] appointment.      . Lurasidone HCl (LATUDA) 60 MG TABS Take 60 mg by mouth as directed. Per Dr. Tammi Klippel in [facility].  Take once daily with food.  #30 with 2 refills      . Lurasidone HCl (LATUDA) 60 MG TABS Take 60 mg by mouth at bedtime.      . Lurasidone HCl (LATUDA) 60 MG TABS Take 60 mg by mouth daily. Per Dr. Tammi Klippel in  [facility].  #30 with 10 refills.      . Rivaroxaban (XARELTO) 20 MG TABS tablet Take 1 tablet (20 mg total) by mouth daily with supper.  30 tablet  10  . traZODone (DESYREL) 50 MG tablet Take 50 mg by mouth at bedtime. Take one pill at night to help with sleep.  #30 with 5 refills per Dr. Tammi Klippel.       No current facility-administered medications on file prior to visit.    I have reviewed and updated the following as appropriate: allergies and current medications SHx: former smoker - quit 09/2013   Review of Systems See HPI    Objective:   Physical Exam BP 112/70  Pulse 77  Temp(Src) 98.7 F (37.1 C) (Oral)  Wt 296 lb (134.265 kg) Gen: alert, cooperative, NAD HEENT: AT/[place], sclera white, MMM Neck: supple CV: RRR, no murmurs Abd: +BS, soft, ND, tender in suprapubic region; no Murphy's sign today      Assessment & Plan:

## 2013-12-29 NOTE — Patient Instructions (Addendum)
It was nice to see you!  We are checking some blood work today.  I will call you if anything is wrong, otherwise you will get a letter with the results in the mail in 1-2 weeks.  We are also getting you set up for an ultrasound of your belly to look for gallstones.  I will call you with the results.  Take Keflex 1 pill 3 times a day for 3 days.  Follow up in 3 months or sooner if needed.

## 2013-12-29 NOTE — Addendum Note (Signed)
Addended by: [place], Brynnley Dayrit on: 12/29/2013 09:51 AM   Modules accepted: Orders

## 2013-12-29 NOTE — Assessment & Plan Note (Signed)
This is her second blood clot.  First was provoked. She is reluctant to commit to lifelong anticoagulation. Will check factor V leiden, cardiolipin, protein c, and protein s. Continue xarelto for total of 1 year. Discussed that if any labs come back positive, will need lifelong; or that if she goes off xarelto and gets a third blood clot she needs lifelong. F/u in 3 months.

## 2013-12-29 NOTE — Assessment & Plan Note (Signed)
Sounds like biliary colic. Will check CMP and get an ultrasound to look for gallstones. Discussed avoiding fatty foods. Follow up as needed.

## 2013-12-29 NOTE — Assessment & Plan Note (Addendum)
History and exam consistent with UTI. UA only with trace blood. Urine sent for culture. Will treat with keflex 500mg  TID x3 days.

## 2013-12-30 LAB — FACTOR 5 LEIDEN

## 2013-12-31 LAB — PROTEIN C, TOTAL AND FUNCTIONAL PANEL
PROTEIN C, TOTAL: 90 % (ref 72–160)
Protein C Activity: 136 % — ABNORMAL HIGH (ref 75–133)

## 2013-12-31 LAB — PROTEIN S, TOTAL AND FUNCTIONAL PANEL
Protein S Activity: 100 % (ref 69–129)
Protein S Total: 103 % (ref 60–150)

## 2013-12-31 LAB — CARDIOLIPIN ANTIBODY: PHOSPHOLIPIDS: 230 mg/dL (ref 151–264)

## 2013-12-31 LAB — URINE CULTURE
Colony Count: NO GROWTH
ORGANISM ID, BACTERIA: NO GROWTH

## 2014-01-03 ENCOUNTER — Telehealth: Payer: Self-pay | Admitting: Psychology

## 2014-01-03 NOTE — Telephone Encounter (Signed)
Carla Hanson called to reschedule her [place] appointment.  First available was March 18th at 11:00.  Scheduled for then.  Asked for a phone call if she is unable to attend.  She reported she had enough medicine to last her.

## 2014-01-05 ENCOUNTER — Ambulatory Visit (HOSPITAL_COMMUNITY)
Admission: RE | Admit: 2014-01-05 | Discharge: 2014-01-05 | Disposition: A | Discharge status: Home / Self Care | Payer: Medicaid Other | Source: Ambulatory Visit | Attending: Family Medicine | Admitting: Family Medicine

## 2014-01-05 DIAGNOSIS — R1011 Right upper quadrant pain: Secondary | ICD-10-CM

## 2014-01-06 ENCOUNTER — Other Ambulatory Visit: Payer: Self-pay | Admitting: Emergency Medicine

## 2014-01-06 ENCOUNTER — Telehealth: Payer: Self-pay | Admitting: Emergency Medicine

## 2014-01-06 MED ORDER — OMEPRAZOLE 40 MG PO CPDR: 40.0000 mg | DELAYED_RELEASE_CAPSULE | Freq: Every day | ORAL | Status: DC

## 2014-01-06 NOTE — Telephone Encounter (Signed)
Pt called back and would like Dr. Bridgett Larsson to call her back. jw

## 2014-01-06 NOTE — Telephone Encounter (Signed)
Left voicemail requesting patient call back so we can discuss her lab results and ultrasound.  I am unsure if the [phone] is correct as the message said [place].  Her blood work for hypercoaguability was negative.  The ultrasound was negative for gallstones.  If she continues to have the RUQ pain after food, I would like her to f/u with me so we can discuss additional testing with a HIDA scan.

## 2014-01-06 NOTE — Telephone Encounter (Signed)
Spoke with patient regarding her lab results.  Will stop xarelto after she completes 1 year - discussed that if she gets a 3rd blood clot, she will need life long anticoagulation.  Also reviewed her ultrasound.  She has been doing some dietary modification which helps, but still having pain with meats/sauces/spicy foods.  Will do a trial of omeprazole.  If no improvement, will likely need HIDA scan.  All of her questions were answered.

## 2014-01-19 ENCOUNTER — Telehealth: Payer: Self-pay | Admitting: Psychology

## 2014-01-19 ENCOUNTER — Ambulatory Visit: Payer: Medicaid Other | Admitting: Psychology

## 2014-01-19 NOTE — Telephone Encounter (Signed)
Carla Hanson left a VM at 7:57 this morning canceling her 11:00 a.m. Appointment.  She said she would call to reschedule.

## 2014-02-09 ENCOUNTER — Telehealth: Payer: Self-pay | Admitting: Psychology

## 2014-02-09 NOTE — Telephone Encounter (Signed)
Carla Hanson called and left a VM to reschedule her missed [place] appointment.  I called her back and left a VM.  I left the appointment time of May 6th at 10:00 on her VM and asked her to call back to confirm.

## 2014-02-22 ENCOUNTER — Other Ambulatory Visit: Payer: Self-pay | Admitting: Emergency Medicine

## 2014-02-22 ENCOUNTER — Other Ambulatory Visit: Payer: Self-pay

## 2014-02-22 DIAGNOSIS — Z1231 Encounter for screening mammogram for malignant neoplasm of breast: Secondary | ICD-10-CM

## 2014-02-24 ENCOUNTER — Ambulatory Visit (INDEPENDENT_AMBULATORY_CARE_PROVIDER_SITE_OTHER): Payer: Medicaid Other | Admitting: Emergency Medicine

## 2014-02-24 ENCOUNTER — Other Ambulatory Visit: Payer: Self-pay | Admitting: Emergency Medicine

## 2014-02-24 ENCOUNTER — Encounter: Payer: Self-pay | Admitting: Emergency Medicine

## 2014-02-24 VITALS — BP 130/78 | HR 86 | Temp 97.6°F | Ht 64.0 in | Wt 298.0 lb

## 2014-02-24 DIAGNOSIS — J069 Acute upper respiratory infection, unspecified: Secondary | ICD-10-CM

## 2014-02-24 DIAGNOSIS — R1011 Right upper quadrant pain: Secondary | ICD-10-CM

## 2014-02-24 DIAGNOSIS — B9789 Other viral agents as the cause of diseases classified elsewhere: Principal | ICD-10-CM

## 2014-02-24 MED ORDER — FLUTICASONE PROPIONATE 50 MCG/ACT NA SUSP: 2.0000 | Freq: Every day | NASAL | Status: DC

## 2014-02-24 MED ORDER — GUAIFENESIN ER 600 MG PO TB12: 600.0000 mg | ORAL_TABLET | Freq: Two times a day (BID) | ORAL | Status: DC

## 2014-02-24 NOTE — Patient Instructions (Signed)
It was nice to see you!  I'm sorry you aren't feeling well.  Take mucinex 1 pill twice a day for the next week. Use Flonase nasal spray daily for the next week. Use nasal saline spray 3-4 times a day for the next week.  You should start to feel better over the weekend. If things are getting worse instead of better, please come back.

## 2014-02-24 NOTE — Progress Notes (Signed)
   Subjective:    Patient ID: Carla Hanson, female    DOB: 10-11-1966, 48 y.o.   MRN: [id number]  HPI Carla Hanson is here for cold symptoms.  She reports a 2-3 day history of sinus congestion, nasal congestion, watery eyes, and coughing/sneezing.  Reports subjective fevers as well.  She has had some mild nausea but no vomiting or diarrhea.  She is drinking fluids well but does not have much of an appetite.  No sick contacts.  No history of prior allergies.  She also states that her abdominal pain has resolved with the omeprazole.  Current Outpatient Prescriptions on File Prior to Visit  Medication Sig Dispense Refill  . acetaminophen (TYLENOL) 500 MG tablet Take 1,000 mg by mouth every 6 (six) hours as needed.      . lamoTRIgine (LAMICTAL) 25 MG tablet Take 25 mg by mouth as directed. Per Dr. Tammi Klippel in [facility].  Take 25 mg daily for one week and then two a day (50 mg) until we see you again.  #60 with one refill.      Marland Kitchen lisinopril-hydrochlorothiazide (PRINZIDE,ZESTORETIC) 20-25 MG per tablet TAKE 1 TABLET BY MOUTH DAILY  30 tablet  6  . Lurasidone HCl (LATUDA) 60 MG TABS Take 60 mg by mouth daily. Per Dr. Tammi Klippel in [facility].  #30 with 10 refills.      Marland Kitchen omeprazole (PRILOSEC) 40 MG capsule TAKE ONE CAPSULE BY MOUTH DAILY  90 capsule  1  . Rivaroxaban (XARELTO) 20 MG TABS tablet Take 1 tablet (20 mg total) by mouth daily with supper.  30 tablet  10  . traZODone (DESYREL) 50 MG tablet Take 50 mg by mouth at bedtime. Take one pill at night to help with sleep.  #30 with 5 refills per Dr. Tammi Klippel.       No current facility-administered medications on file prior to visit.    I have reviewed and updated the following as appropriate: allergies and current medications SHx: former smoker  Health Maintenance: mammogram scheduled for June; will need to discuss pap with her a f/u   Review of Systems See HPI    Objective:   Physical Exam BP 130/78  Pulse 86  Temp(Src) 97.6 F (36.4 C) (Oral)   Ht 5\' 4"  (1.626 m)  Wt 298 lb (135.172 kg)  BMI 51.13 kg/m2  LMP 01/31/2014 Gen: alert, cooperative, NAD HEENT: AT/, sclera white, MMM, mild pharyngeal erythema without exudate; nasal mucosa erythematous with clear discharge present; L TM mildly retracted, R TM normal; no sinus tenderness Neck: supple, no LAD CV: RRR, no murmurs Pulm: CTAB, no wheezes or rales      Assessment & Plan:

## 2014-02-24 NOTE — Assessment & Plan Note (Signed)
History and exam consistent. No signs of pneumonia. Symptomatic care as in AVS. F/u if not improving next week.

## 2014-02-24 NOTE — Assessment & Plan Note (Signed)
Likely GERD as it has improved on omeprazole.

## 2014-03-09 ENCOUNTER — Ambulatory Visit: Payer: Medicaid Other | Admitting: Psychology

## 2014-03-09 ENCOUNTER — Telehealth: Payer: Self-pay | Admitting: Psychology

## 2014-03-09 NOTE — Telephone Encounter (Signed)
Carla Hanson left a VM stating she thought her appointment was at 10:30 rather than 10:00.  She would like to reschedule and needs a refill on her Latuda.  Reviewed her chart - she was given enough refills to last her through November of this year.  Asked her to call her pharmacy to initiate the refill.  If for some reason they don't have it, she should call me back and I can authorize per Dr. Tammi Klippel.  Scheduled an appointment for June 17th at 9:30.

## 2014-04-04 ENCOUNTER — Ambulatory Visit: Payer: Medicaid Other

## 2014-04-20 ENCOUNTER — Ambulatory Visit: Payer: Medicaid Other | Admitting: Psychology

## 2014-04-20 ENCOUNTER — Telehealth: Payer: Self-pay | Admitting: Psychology

## 2014-04-20 NOTE — Telephone Encounter (Signed)
Carla Hanson left a VM at 7:35 to cancel her appointment for later this morning.  This is the 5th canceled and rescheduled appointment.  Called patient back to discuss.  Agreed to schedule her on July 1st at 11:30.  We discussed her missed appointments.  She states she likes coming to the clinic - was too down / unmotivated to come today.  Discussed how this feeling might be the reason she NEEDS to attend her appointment.  Looked at how she could increase her chances of coming to a scheduled appointment and decided she would partner with her mother who has been helpful in the past.  She was able to repeat the date and time of her appointment back to me.

## 2014-05-04 ENCOUNTER — Ambulatory Visit (INDEPENDENT_AMBULATORY_CARE_PROVIDER_SITE_OTHER): Payer: Medicaid Other | Admitting: Psychology

## 2014-05-04 DIAGNOSIS — F319 Bipolar disorder, unspecified: Secondary | ICD-10-CM

## 2014-05-04 NOTE — Assessment & Plan Note (Signed)
Report of mood is irritable.  Affect is consistent.  She reports she DOES NOT want to think.  I pushed her a couple of different ways and she was able to smile a few times and provide some information.  Treatment team thinks that while irritability is definitely present, she has the choice to manage it better but does not because it serves a purpose.    Used MI techniques today to try to resolve ambivalence about taking a medicine to help her mood.  She was a 5 on the confidence scale for taking the already filled Lamictal she has at home.  Attempts to resolve this were not fruitful.  Used a patient-centered approach to gauge desire for follow-up.  She said, "whenever you tell me to come back in."  She said she would make that appointment.  I asked her to call in between with questions or concerns.  See patient instructions for further plan.

## 2014-05-04 NOTE — Patient Instructions (Signed)
Please schedule a follow-up and PLEASE attend your follow-up for:  August 12th at 10:00. We recommend that you start the medicine that we prescribed last time.  Lamictal has a good chance at helping you feel less down and irritable.  Start with 25 mg for two weeks and then 50 mg for two weeks and then 100 mg.  We should see you back sometime between 50 and 100 mg.  Most common side effect is a headache.  If you develop an unexpected rash, call me at [phone].

## 2014-05-04 NOTE — Progress Notes (Signed)
Carla Hanson presents for follow-up.  She hasn't been seen since January secondary to multiple cancellations.  She states that she is very irritable today and only wishes to be in her bed.  She came because of our last conversation about missed appointment.  It is somewhat difficult to illicit information about her mood other than she is irritable and has been for about two weeks.  She does not recall a mood prior to that.  She denies triggers for this mood.  She does note there is increased stress in the house with her two-month old grandson but reports that her daughter is handling things well.    Of note, she filled but never started taking the Lamictal prescribed at her last visit.  When she discovered it was an anti-seizure medicine, she was ambivalent about taking it.

## 2014-06-15 ENCOUNTER — Ambulatory Visit (INDEPENDENT_AMBULATORY_CARE_PROVIDER_SITE_OTHER): Payer: Medicaid Other | Admitting: Psychology

## 2014-06-15 DIAGNOSIS — F319 Bipolar disorder, unspecified: Secondary | ICD-10-CM

## 2014-06-15 NOTE — Patient Instructions (Addendum)
It was good to see you today Indy.  Please schedule a follow-up for 9/16 at 10:00. We continued to discuss your medicine today.  We understand you are unsure about whether or not to take it.  The other options we provided were not to your liking.  We do think that Lamictal has the chance to help you with your mood.  If you decide to start the medicine, please let us know at [phone].  You can call for an appointment as needed.

## 2014-06-15 NOTE — Progress Notes (Signed)
Carla Hanson presents for follow-up.  She reports she is feeling well and has been for the last few weeks.  She did not start the Lamictal stating that she just can't bring herself to do that.  Explored (again) in depth.  Current report of symptoms includes nothing bothersome.  She takes care of her 48 month old grandson while her daughter works and she enjoys this.

## 2014-06-15 NOTE — Assessment & Plan Note (Signed)
Report of mood is euthymic.  Affect is brighter - seems less irritable / guarded than usual.  Used MI techniques to assess thoughts on Lamictal.  Biggest issue seems to be the fact it is an anti-seizure medicine.  She was unable to state much more than that.  Discussed other options including Lithium.  She is not interested in this medicine.  Surprisingly, she is a 10 on importance for taking a medicine to help her mood and she is a 9 on confidence that a medicine would be helpful.  She remains ambivalent however.    We were going to allow her to call whenever she decided she wanted to follow-up.  She asked to schedule an appointment.  Did so for 9/16 at 10:00.  See patient instructions for further plan.

## 2014-06-16 ENCOUNTER — Encounter: Payer: Medicaid Other | Admitting: Family Medicine

## 2014-07-20 ENCOUNTER — Telehealth: Payer: Self-pay | Admitting: Psychology

## 2014-07-20 ENCOUNTER — Ambulatory Visit: Payer: Medicaid Other | Admitting: Psychology

## 2014-07-20 NOTE — Telephone Encounter (Signed)
Carla Hanson called this morning at 9:37 to say that she is having car trouble and will be unable to attend her follow-up today.  I called her back and left a VM.

## 2014-07-29 NOTE — Telephone Encounter (Signed)
Carla Hanson called to reschedule her missed appointment.  Scheduled for 10/21 at 11:30.  She said she would call if unable to attend.

## 2014-08-24 ENCOUNTER — Ambulatory Visit (INDEPENDENT_AMBULATORY_CARE_PROVIDER_SITE_OTHER): Payer: Medicaid Other | Admitting: Psychology

## 2014-08-24 DIAGNOSIS — F3177 Bipolar disorder, in partial remission, most recent episode mixed: Secondary | ICD-10-CM

## 2014-08-24 NOTE — Assessment & Plan Note (Signed)
Report of mood is good today.  Affect is consistent - she appears less irritable than usual.  She is not interested in making any changes in her treatment and doesn't have a lot to talk about today.  I asked if she wished to call to follow or to schedule now.  She elected to schedule now and wanted an appointment for about a month.  See patient instructions for further plan.

## 2014-08-24 NOTE — Patient Instructions (Signed)
Please schedule a follow-up for:  December 2nd at 9:00. It was great to see you today.  Just wait until he starts walking!!!

## 2014-08-24 NOTE — Progress Notes (Signed)
Carla Hanson presents for follow-up.  Last seen in August.  She reports she is busy taking care of her 42 month old grandson while her daughter works two jobs.  She finds this overwhelming at times.  It pushes her to do more than she normally would and this, on occasion, is a good thing.    She has not started the Lamictal.  She is not interested in taking an additional medicine to manage her mood.  She thinks the [place] is helpful in terms of helping her to be less irritable and less sad.  On occasion, she awakens in an irritable mood that lasts a few hours and sometimes into the next day.  It is accompanied by "pacing," an urge to clean, and a short attention span.

## 2014-09-05 ENCOUNTER — Telehealth: Payer: Self-pay | Admitting: Psychology

## 2014-09-05 NOTE — Telephone Encounter (Signed)
Carla Hanson called to request a beh med appointment and expressed interest in her mom attending with her.  We scheduled for Thursday the 12th at 10:00.

## 2014-09-12 ENCOUNTER — Ambulatory Visit: Payer: Medicaid Other | Admitting: Family Medicine

## 2014-09-15 ENCOUNTER — Ambulatory Visit (INDEPENDENT_AMBULATORY_CARE_PROVIDER_SITE_OTHER): Payer: Self-pay | Admitting: Psychology

## 2014-09-15 DIAGNOSIS — F3177 Bipolar disorder, in partial remission, most recent episode mixed: Secondary | ICD-10-CM

## 2014-09-15 NOTE — Assessment & Plan Note (Signed)
Interesting family dynamics here.  Attempted to focus on boundaries (mom's) and feelings (Sharda's and mom's).  Clarified what Sulay most wanted to be heard and mom was able to do that.  Also attempted to clarify what mom was saying and Ashunti was able to state that she needs more of a life.  There is a good deal of ambivalence around this however.  At one point, early on, Loma said that she wanted to have a better relationship with her sister.  Over time, it became clear that trust is a major factor as is hurt and scared feelings.  A better relationship is a low priority for Tristina presently (a 2 on a 10 point scale).    Starlett wished to come back to discuss further.  We scheduled for:  10/06/14 at 10:00.

## 2014-09-15 NOTE — Progress Notes (Signed)
Carla Hanson presented with her mother, Carla Hanson.  Her stated goal for the visit was to share feelings she has about her sister, Carla Hanson, and her perception of how Carla Hanson treats their mother.  This is something that Carla Hanson has discussed before with her mother but she has often ended up feeling not heard or understood.  She wanted to discuss it in a "therapeutic environment" where she felt safe.    Carla Hanson reported on a long history of Carla Hanson "lying" about Carla Hanson and about other things.  She thinks Carla Hanson takes advantage of their mother and thinks that their mother favors Carla Hanson in ways that are hurtful to Carla Hanson.    Carla Hanson (mom) states that she loves all of her children the same despite the fact none of them are perfect.  She feels comfortable with her relationship with Carla Hanson (does not feel manipulated or used), and that part of the reason she spends time at Carla Hanson rather than Carla Hanson is because Carla Hanson is always at [employer].  Carla Hanson added that she really feels like she needs some space from Carla Hanson.

## 2014-09-23 ENCOUNTER — Other Ambulatory Visit: Payer: Self-pay | Admitting: *Deleted

## 2014-09-26 MED ORDER — LISINOPRIL-HYDROCHLOROTHIAZIDE 20-25 MG PO TABS: 1.0000 | ORAL_TABLET | Freq: Every day | ORAL | Status: DC

## 2014-09-28 ENCOUNTER — Other Ambulatory Visit: Payer: Self-pay | Admitting: *Deleted

## 2014-10-05 ENCOUNTER — Ambulatory Visit: Payer: Medicaid Other | Admitting: Psychology

## 2014-10-06 ENCOUNTER — Ambulatory Visit: Payer: Medicaid Other | Admitting: Psychology

## 2014-10-10 ENCOUNTER — Ambulatory Visit: Payer: Medicaid Other | Admitting: Family Medicine

## 2014-10-14 ENCOUNTER — Ambulatory Visit: Payer: Medicaid Other | Admitting: Psychology

## 2014-10-19 ENCOUNTER — Ambulatory Visit (INDEPENDENT_AMBULATORY_CARE_PROVIDER_SITE_OTHER): Payer: Medicaid Other | Admitting: Psychology

## 2014-10-19 DIAGNOSIS — F3177 Bipolar disorder, in partial remission, most recent episode mixed: Secondary | ICD-10-CM

## 2014-10-19 NOTE — Patient Instructions (Signed)
Please schedule a follow-up for: January 6th at 11:00.  Call me at [phone] if unable to attend.   We are sorry you have not had your medicine for a month or so and that you are feeling so down.  Dr. Tammi Klippel thinks restarting will help you considerably.  Please call us if your new Medicaid does not allow you to get this prescription.  Dr. Tammi Klippel will send you some samples. If you do start to have thoughts about hurting yourself or others, you can call 911 or go to the [facility] Emergency Department.

## 2014-10-19 NOTE — Assessment & Plan Note (Signed)
Report of mood is depressed and irritable.  Affect is consistent.  Most appropriate treatment strategy is to restart medicine.  Patient agrees.  She states she has not had thoughts about hurting herself or others and therefore, hospitalization is not indicated - especially since she can restart a medicine that has been helpful to her in the past and has confidence that it will be helpful.  She elected to follow-up in November 09, 2014.    She is supposed to call us when she has her medicine or if she is unable to get it, we can provide samples through Dr. Tammi Klippel.

## 2014-10-19 NOTE — Progress Notes (Signed)
Carla Hanson presents for follow-up.  She reports her mood is poor.  She has been out of Latuda for a reported month secondary to a change in her Medicaid.  She is not sure whether she can get Latuda with her new medicaid and has not yet checked it out.  She thinks restarting her medicine will help her mood.  She does not note any other thing to talk about.  Near the end of the visit, she asked about hospitalization.  She isn't sure what it would do for her other than allow her to rest.

## 2014-10-20 ENCOUNTER — Telehealth: Payer: Self-pay | Admitting: Psychology

## 2014-10-20 NOTE — Telephone Encounter (Signed)
Patient left a VM yesterday stating that Medicaid will not cover her Latuda.  Dr. Tammi Klippel will send samples and we need to get her connected with the patient assistance program.

## 2014-10-31 ENCOUNTER — Ambulatory Visit: Payer: Medicaid Other

## 2014-11-01 ENCOUNTER — Ambulatory Visit: Payer: Medicaid Other | Admitting: Psychology

## 2014-11-01 ENCOUNTER — Ambulatory Visit: Payer: Medicaid Other | Admitting: Family Medicine

## 2014-11-08 ENCOUNTER — Ambulatory Visit (INDEPENDENT_AMBULATORY_CARE_PROVIDER_SITE_OTHER): Payer: Medicaid Other | Admitting: Family Medicine

## 2014-11-08 ENCOUNTER — Encounter: Payer: Self-pay | Admitting: Family Medicine

## 2014-11-08 VITALS — BP 172/81 | HR 83 | Temp 98.4°F | Ht 64.0 in | Wt 283.0 lb

## 2014-11-08 DIAGNOSIS — R7309 Other abnormal glucose: Secondary | ICD-10-CM | POA: Diagnosis not present

## 2014-11-08 DIAGNOSIS — E785 Hyperlipidemia, unspecified: Secondary | ICD-10-CM | POA: Diagnosis not present

## 2014-11-08 DIAGNOSIS — I10 Essential (primary) hypertension: Secondary | ICD-10-CM

## 2014-11-08 DIAGNOSIS — F3177 Bipolar disorder, in partial remission, most recent episode mixed: Secondary | ICD-10-CM | POA: Diagnosis not present

## 2014-11-08 DIAGNOSIS — R7303 Prediabetes: Secondary | ICD-10-CM

## 2014-11-08 MED ORDER — LISINOPRIL-HYDROCHLOROTHIAZIDE 20-25 MG PO TABS: 1.0000 | ORAL_TABLET | Freq: Every day | ORAL | Status: DC

## 2014-11-08 NOTE — Progress Notes (Signed)
Patient ID: Carla Hanson, female   DOB: June 27, 1966, 49 y.o.   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   HTN:  Currently on lisinopril-HCTZ 20-25mg ; no missed doses. Does not take BPs at home.  Has had dietary indiscretion with more salt intake recently  Does not exercise. No chest pain, SOB, or light headedness; no medication side effects Requires a refill  Bipolar: Currently on Latuda; not taking Lamictal. Concerned about metabolic side effects of Latuda and would like testing. Feels bipolar is stable, has follow up in [facility] tomorrow.   H/o PE and DVT Patient has been off Xarelto for 1-2 months due to financial restraints, did not inform anyone since she was "almost done anyways." No extremity pain/swelling/redness, chest pain, or SOB.   Smoking status noted.  Past Medical History Patient Active Problem List   Diagnosis Date Noted  . Viral URI with cough 02/24/2014  . RUQ abdominal pain 12/29/2013  . 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 Pre-diabetes 03/02/2013  . History of pulmonary embolism 05/05/2012  . Painful menstrual periods 01/05/2012  . Bipolar disorder 07/03/2010  . History of tobacco abuse 08/23/2009  . Hyperlipidemia 04/30/2007  . OBESITY, NOS 01/01/2007  . HYPERTENSION, BENIGN SYSTEMIC 01/01/2007    Medications- reviewed and updated Current Outpatient Prescriptions  Medication Sig Dispense Refill  . lisinopril-hydrochlorothiazide (PRINZIDE,ZESTORETIC) 20-25 MG per tablet Take 1 tablet by mouth daily. 30 tablet 3  . traZODone (DESYREL) 50 MG tablet Take 50 mg by mouth at bedtime. Take one pill at night to help with sleep.  #30 with 11 refills per Dr. Tammi Klippel.    Marland Kitchen acetaminophen (TYLENOL) 500 MG tablet Take 1,000 mg by mouth every 6 (six) hours as needed.    .  lamoTRIgine (LAMICTAL) 25 MG tablet Take 25 mg by mouth as directed. Per Dr. Tammi Klippel in [facility].  Take 25 mg daily for one week and then two a day (50 mg) until we see you again.  #60 with one refill.    . Lurasidone HCl (LATUDA) 60 MG TABS Take 60 mg by mouth daily. Per Dr. Tammi Klippel in [facility].  #30 with 10 refills.    . Rivaroxaban (XARELTO) 20 MG TABS tablet Take 1 tablet (20 mg total) by mouth daily with supper. (Patient not taking: Reported on 11/08/2014) 30 tablet 10   No current facility-administered medications for this visit.    Objective: BP 172/81 mmHg  Pulse 83  Temp(Src) 98.4 F (36.9 C) (Oral)  Ht 5\' 4"  (1.626 m)  Wt 283 lb (128.368 kg)  BMI 48.55 kg/m2  Repeat manuel BP 166/76 Gen: No acute distress. Alert, cooperative with exam HEENT: Atraumatic Oropharynx clear. MMM CV: RRR. No murmurs, rubs, or gallops noted. 2+ radial pulses bilaterally. Resp: CTAB. No wheezing, crackles, or rhonchi noted. Ext: No edema. No gross deformities. Neuro: Alert and oriented, Normal gait.    Assessment/Plan:  HYPERTENSION, BENIGN SYSTEMIC Blood pressure elevated on exam this morning, with repeat manual blood pressure by me of 166/76. Patient endorses dietary indiscretion and no exercise. As the patient is already on the maximum lisinopril-HCTZ dose, we discussed starting a new medication. The patient endorses that she would like to attempt lifestyle modification, as her blood pressure has previously been under well control with her current regimen. Of note,  the patient has actually had weight loss from 298 pounds in April to 283 pounds at this visit. -Blood pressure elevated, discussed the need for additional medications such as a calcium channel blocker BP is not improved with lifestyle modifications. -Lisinopril-HCTZ refilled today -CMET ordered for tomorrow -Hemoglobin A1c ordered for tomorrow -Patient to follow-up with PCP in approximately 1 month for a Pap smear, at which time her blood pressure  should be re-evaluated.  Hyperlipidemia Ordered a fasting lipid panel for tomorrow (prior to her appointment at mood disorder clinic) as the patient is on [place].    Orders Placed This Encounter  Procedures  . Lipid Panel    Standing Status: Future     Number of Occurrences:      Standing Expiration Date: 11/09/2015  . Comprehensive metabolic panel    Standing Status: Future     Number of Occurrences:      Standing Expiration Date: 11/09/2015  . POCT HgB A1C    Standing Status: Future     Number of Occurrences:      Standing Expiration Date: 05/09/2015    Meds ordered this encounter  Medications  . lisinopril-hydrochlorothiazide (PRINZIDE,ZESTORETIC) 20-25 MG per tablet    Sig: Take 1 tablet by mouth daily.    Dispense:  30 tablet    Refill:  3

## 2014-11-08 NOTE — Assessment & Plan Note (Signed)
Ordered a fasting lipid panel for tomorrow (prior to her appointment at mood disorder clinic) as the patient is on [place].

## 2014-11-08 NOTE — Patient Instructions (Addendum)
It was nice meeting you. Please come back for blood work fasting (no food/beverages except water or black coffee without sugar); try to make a lab appointment for tomorrow prior to your mood disorder clinic appointment. I will call you with any ABNORMAL lab results. Please work on avoiding high salt foods; make an appointment with your PCP to get a pap smear and if your blood pressure is still elevated we may need to make changes to your current medications.

## 2014-11-08 NOTE — Assessment & Plan Note (Addendum)
Blood pressure elevated on exam this morning, with repeat manual blood pressure by me of 166/76. Patient endorses dietary indiscretion and no exercise. As the patient is already on the maximum lisinopril-HCTZ dose, we discussed starting a new medication. The patient endorses that she would like to attempt lifestyle modification, as her blood pressure has previously been under well control with her current regimen. Of note, the patient has actually had weight loss from 298 pounds in April to 283 pounds at this visit. -Blood pressure elevated, discussed the need for additional medications such as a calcium channel blocker BP is not improved with lifestyle modifications. -Lisinopril-HCTZ refilled today -CMET ordered for tomorrow -Hemoglobin A1c ordered for tomorrow -Patient to follow-up with PCP in approximately 1 month for a Pap smear, at which time her blood pressure should be re-evaluated.

## 2014-11-09 ENCOUNTER — Ambulatory Visit (INDEPENDENT_AMBULATORY_CARE_PROVIDER_SITE_OTHER): Payer: Medicaid Other | Admitting: Psychology

## 2014-11-09 ENCOUNTER — Other Ambulatory Visit (INDEPENDENT_AMBULATORY_CARE_PROVIDER_SITE_OTHER): Payer: Medicaid Other

## 2014-11-09 DIAGNOSIS — I10 Essential (primary) hypertension: Secondary | ICD-10-CM

## 2014-11-09 DIAGNOSIS — R7309 Other abnormal glucose: Secondary | ICD-10-CM

## 2014-11-09 DIAGNOSIS — F3177 Bipolar disorder, in partial remission, most recent episode mixed: Secondary | ICD-10-CM

## 2014-11-09 DIAGNOSIS — R7303 Prediabetes: Secondary | ICD-10-CM

## 2014-11-09 DIAGNOSIS — F3178 Bipolar disorder, in full remission, most recent episode mixed: Secondary | ICD-10-CM

## 2014-11-09 LAB — LIPID PANEL
CHOL/HDL RATIO: 4.7 ratio
Cholesterol: 200 mg/dL (ref 0–200)
HDL: 43 mg/dL (ref >39)
LDL Cholesterol: 134 mg/dL — ABNORMAL HIGH (ref 0–99)
Triglycerides: 114 mg/dL (ref <150)
VLDL: 23 mg/dL (ref 0–40)

## 2014-11-09 LAB — COMPREHENSIVE METABOLIC PANEL
ALT: 9 U/L (ref 0–35)
AST: 11 U/L (ref 0–37)
Albumin: 3.7 g/dL (ref 3.5–5.2)
Alkaline Phosphatase: 43 U/L (ref 39–117)
BILIRUBIN TOTAL: 0.3 mg/dL (ref 0.2–1.2)
BUN: 11 mg/dL (ref 6–23)
CO2: 22 mEq/L (ref 19–32)
Calcium: 9.2 mg/dL (ref 8.4–10.5)
Chloride: 103 mEq/L (ref 96–112)
Creat: 0.57 mg/dL (ref 0.50–1.10)
Glucose, Bld: 91 mg/dL (ref 70–99)
Potassium: 4.1 mEq/L (ref 3.5–5.3)
Sodium: 136 mEq/L (ref 135–145)
TOTAL PROTEIN: 7.5 g/dL (ref 6.0–8.3)

## 2014-11-09 LAB — POCT GLYCOSYLATED HEMOGLOBIN (HGB A1C): Hemoglobin A1C: 5.3

## 2014-11-09 NOTE — Progress Notes (Signed)
CMP,FLP AND A1C DONE TODAY Jc Veron

## 2014-11-09 NOTE — Patient Instructions (Signed)
Please schedule a follow-up:  February 17th at 10:30 for [facility].  Call in the meantime if you have any difficulty getting your medicine.  Dr. Tammi Klippel recommended you follow-up with the company with the following information:  My doctor faxed forms for the patient assistance program.  I want to make sure it goes to the correct pharmacy.  The [facility] where I get healthcare does not accept medicine so it needs to go to my pharmacy. So happy to hear that you are doing well.

## 2014-11-10 NOTE — Progress Notes (Signed)
Carla Hanson presents for follow-up.  She received the medication (Latuda 20 mg) in the mail and has been taking it consistently without difficulty.  She has noticed a significant change and feels good about her ability to manager her mood presently.  She has no complaints today and nothing she hopes to accomplish other than get the paperwork completed for the Patient Assistance Program through [employer].

## 2014-11-10 NOTE — Assessment & Plan Note (Signed)
Report of mood is euthymic.  Affect is fine - she is always a bit guarded / irritable appearing even when she reports everything is fine.  Thoughts are clear and goal directed.  No evidence of HI / SI.  Function in all areas covered is where she would like it to be.     Dr. Tammi Klippel completed the paperwork and she plans to fax this to the company.  Dr. Tammi Klippel will send additional medication until we hear back.    Follow-up on 12/21/14 at 10:30 or sooner as needed.  She did not wish to reschedule her missed individual therapy appointment at this time.

## 2014-11-24 ENCOUNTER — Ambulatory Visit: Payer: Medicaid Other

## 2014-12-21 ENCOUNTER — Ambulatory Visit (INDEPENDENT_AMBULATORY_CARE_PROVIDER_SITE_OTHER): Payer: Medicaid Other | Admitting: Family Medicine

## 2014-12-21 ENCOUNTER — Ambulatory Visit (INDEPENDENT_AMBULATORY_CARE_PROVIDER_SITE_OTHER): Payer: Self-pay | Admitting: Psychology

## 2014-12-21 ENCOUNTER — Encounter: Payer: Self-pay | Admitting: Family Medicine

## 2014-12-21 ENCOUNTER — Other Ambulatory Visit (HOSPITAL_COMMUNITY)
Admission: RE | Admit: 2014-12-21 | Discharge: 2014-12-21 | Disposition: A | Discharge status: Home / Self Care | Payer: Medicaid Other | Source: Ambulatory Visit | Attending: Family Medicine | Admitting: Family Medicine

## 2014-12-21 VITALS — BP 133/76 | HR 60 | Temp 97.8°F | Ht 64.0 in | Wt 285.3 lb

## 2014-12-21 DIAGNOSIS — Z113 Encounter for screening for infections with a predominantly sexual mode of transmission: Secondary | ICD-10-CM | POA: Diagnosis not present

## 2014-12-21 DIAGNOSIS — E785 Hyperlipidemia, unspecified: Secondary | ICD-10-CM

## 2014-12-21 DIAGNOSIS — Z01419 Encounter for gynecological examination (general) (routine) without abnormal findings: Secondary | ICD-10-CM | POA: Insufficient documentation

## 2014-12-21 DIAGNOSIS — N760 Acute vaginitis: Secondary | ICD-10-CM | POA: Insufficient documentation

## 2014-12-21 DIAGNOSIS — Z124 Encounter for screening for malignant neoplasm of cervix: Secondary | ICD-10-CM | POA: Diagnosis not present

## 2014-12-21 DIAGNOSIS — Z1151 Encounter for screening for human papillomavirus (HPV): Secondary | ICD-10-CM | POA: Insufficient documentation

## 2014-12-21 DIAGNOSIS — F3178 Bipolar disorder, in full remission, most recent episode mixed: Secondary | ICD-10-CM

## 2014-12-21 DIAGNOSIS — I10 Essential (primary) hypertension: Secondary | ICD-10-CM

## 2014-12-21 DIAGNOSIS — Z0000 Encounter for general adult medical examination without abnormal findings: Secondary | ICD-10-CM

## 2014-12-21 LAB — POCT WET PREP (WET MOUNT): CLUE CELLS WET PREP WHIFF POC: POSITIVE

## 2014-12-21 MED ORDER — ATORVASTATIN CALCIUM 40 MG PO TABS: 40.0000 mg | ORAL_TABLET | Freq: Every day | ORAL | Status: DC

## 2014-12-21 NOTE — Patient Instructions (Signed)
It was nice to meet you today!  Start lipitor $RemoveBefor'40mg'CJBFOWmCmonc$  daily for cholesterol and cardiovascular risk reduction Your tetanus shot will be due in October 2016 See the handout on how to schedule your mammogram. This is an important test to screen for breast cancer.  I will call you if your test results are not normal.  Otherwise, I will send you a letter.  If you do not hear from me with in 2 weeks please call our office.     I want you to schedule an appointment with colposcopy clinic to have your cervix examined more closely.  Be well, Dr. Ardelia Mems     Health Maintenance Adopting a healthy lifestyle and getting preventive care can go a long way to promote health and wellness. Talk with your health care provider about what schedule of regular examinations is right for you. This is a good chance for you to check in with your provider about disease prevention and staying healthy. In between checkups, there are plenty of things you can do on your own. Experts have done a lot of research about which lifestyle changes and preventive measures are most likely to keep you healthy. Ask your health care provider for more information. WEIGHT AND DIET  Eat a healthy diet  Be sure to include plenty of vegetables, fruits, low-fat dairy products, and lean protein.  Do not eat a lot of foods high in solid fats, added sugars, or salt.  Get regular exercise. This is one of the most important things you can do for your health.  Most adults should exercise for at least 150 minutes each week. The exercise should increase your heart rate and make you sweat (moderate-intensity exercise).  Most adults should also do strengthening exercises at least twice a week. This is in addition to the moderate-intensity exercise.  Maintain a healthy weight  Body mass index (BMI) is a measurement that can be used to identify possible weight problems. It estimates body fat based on height and weight. Your health care provider can  help determine your BMI and help you achieve or maintain a healthy weight.  For females 86 years of age and older:   A BMI below 18.5 is considered underweight.  A BMI of 18.5 to 24.9 is normal.  A BMI of 25 to 29.9 is considered overweight.  A BMI of 30 and above is considered obese.  Watch levels of cholesterol and blood lipids  You should start having your blood tested for lipids and cholesterol at 49 years of age, then have this test every 5 years.  You may need to have your cholesterol levels checked more often if:  Your lipid or cholesterol levels are high.  You are older than 49 years of age.  You are at high risk for heart disease.  CANCER SCREENING   Lung Cancer  Lung cancer screening is recommended for adults 76-32 years old who are at high risk for lung cancer because of a history of smoking.  A yearly low-dose CT scan of the lungs is recommended for people who:  Currently smoke.  Have quit within the past 15 years.  Have at least a 30-pack-year history of smoking. A pack year is smoking an average of one pack of cigarettes a day for 1 year.  Yearly screening should continue until it has been 15 years since you quit.  Yearly screening should stop if you develop a health problem that would prevent you from having lung cancer treatment.  Breast  Cancer  Practice breast self-awareness. This means understanding how your breasts normally appear and feel.  It also means doing regular breast self-exams. Let your health care provider know about any changes, no matter how small.  If you are in your 20s or 30s, you should have a clinical breast exam (CBE) by a health care provider every 1-3 years as part of a regular health exam.  If you are 31 or older, have a CBE every year. Also consider having a breast X-ray (mammogram) every year.  If you have a family history of breast cancer, talk to your health care provider about genetic screening.  If you are at high  risk for breast cancer, talk to your health care provider about having an MRI and a mammogram every year.  Breast cancer gene (BRCA) assessment is recommended for women who have family members with BRCA-related cancers. BRCA-related cancers include:  Breast.  Ovarian.  Tubal.  Peritoneal cancers.  Results of the assessment will determine the need for genetic counseling and BRCA1 and BRCA2 testing. Cervical Cancer Routine pelvic examinations to screen for cervical cancer are no longer recommended for nonpregnant women who are considered low risk for cancer of the pelvic organs (ovaries, uterus, and vagina) and who do not have symptoms. A pelvic examination may be necessary if you have symptoms including those associated with pelvic infections. Ask your health care provider if a screening pelvic exam is right for you.   The Pap test is the screening test for cervical cancer for women who are considered at risk.  If you had a hysterectomy for a problem that was not cancer or a condition that could lead to cancer, then you no longer need Pap tests.  If you are older than 65 years, and you have had normal Pap tests for the past 10 years, you no longer need to have Pap tests.  If you have had past treatment for cervical cancer or a condition that could lead to cancer, you need Pap tests and screening for cancer for at least 20 years after your treatment.  If you no longer get a Pap test, assess your risk factors if they change (such as having a new sexual partner). This can affect whether you should start being screened again.  Some women have medical problems that increase their chance of getting cervical cancer. If this is the case for you, your health care provider may recommend more frequent screening and Pap tests.  The human papillomavirus (HPV) test is another test that may be used for cervical cancer screening. The HPV test looks for the virus that can cause cell changes in the cervix.  The cells collected during the Pap test can be tested for HPV.  The HPV test can be used to screen women 68 years of age and older. Getting tested for HPV can extend the interval between normal Pap tests from three to five years.  An HPV test also should be used to screen women of any age who have unclear Pap test results.  After 49 years of age, women should have HPV testing as often as Pap tests.  Colorectal Cancer  This type of cancer can be detected and often prevented.  Routine colorectal cancer screening usually begins at 49 years of age and continues through 49 years of age.  Your health care provider may recommend screening at an earlier age if you have risk factors for colon cancer.  Your health care provider may also recommend using home  test kits to check for hidden blood in the stool.  A small camera at the end of a tube can be used to examine your colon directly (sigmoidoscopy or colonoscopy). This is done to check for the earliest forms of colorectal cancer.  Routine screening usually begins at age 22.  Direct examination of the colon should be repeated every 5-10 years through 49 years of age. However, you may need to be screened more often if early forms of precancerous polyps or small growths are found. Skin Cancer  Check your skin from head to toe regularly.  Tell your health care provider about any new moles or changes in moles, especially if there is a change in a mole's shape or color.  Also tell your health care provider if you have a mole that is larger than the size of a pencil eraser.  Always use sunscreen. Apply sunscreen liberally and repeatedly throughout the day.  Protect yourself by wearing long sleeves, pants, a wide-brimmed hat, and sunglasses whenever you are outside. HEART DISEASE, DIABETES, AND HIGH BLOOD PRESSURE   Have your blood pressure checked at least every 1-2 years. High blood pressure causes heart disease and increases the risk of  stroke.  If you are between 56 years and 26 years old, ask your health care provider if you should take aspirin to prevent strokes.  Have regular diabetes screenings. This involves taking a blood sample to check your fasting blood sugar level.  If you are at a normal weight and have a low risk for diabetes, have this test once every three years after 49 years of age.  If you are overweight and have a high risk for diabetes, consider being tested at a younger age or more often. PREVENTING INFECTION  Hepatitis B  If you have a higher risk for hepatitis B, you should be screened for this virus. You are considered at high risk for hepatitis B if:  You were born in a country where hepatitis B is common. Ask your health care provider which countries are considered high risk.  Your parents were born in a high-risk country, and you have not been immunized against hepatitis B (hepatitis B vaccine).  You have HIV or AIDS.  You use needles to inject street drugs.  You live with someone who has hepatitis B.  You have had sex with someone who has hepatitis B.  You get hemodialysis treatment.  You take certain medicines for conditions, including cancer, organ transplantation, and autoimmune conditions. Hepatitis C  Blood testing is recommended for:  Everyone born from [birth year] through 1965.  Anyone with known risk factors for hepatitis C. Sexually transmitted infections (STIs)  You should be screened for sexually transmitted infections (STIs) including gonorrhea and chlamydia if:  You are sexually active and are younger than 49 years of age.  You are older than 49 years of age and your health care provider tells you that you are at risk for this type of infection.  Your sexual activity has changed since you were last screened and you are at an increased risk for chlamydia or gonorrhea. Ask your health care provider if you are at risk.  If you do not have HIV, but are at risk, it may be  recommended that you take a prescription medicine daily to prevent HIV infection. This is called pre-exposure prophylaxis (PrEP). You are considered at risk if:  You are sexually active and do not regularly use condoms or know the HIV status of your partner(s).  You take drugs by injection.  You are sexually active with a partner who has HIV. Talk with your health care provider about whether you are at high risk of being infected with HIV. If you choose to begin PrEP, you should first be tested for HIV. You should then be tested every 3 months for as long as you are taking PrEP.  PREGNANCY   If you are premenopausal and you may become pregnant, ask your health care provider about preconception counseling.  If you may become pregnant, take 400 to 800 micrograms (mcg) of folic acid every day.  If you want to prevent pregnancy, talk to your health care provider about birth control (contraception). OSTEOPOROSIS AND MENOPAUSE   Osteoporosis is a disease in which the bones lose minerals and strength with aging. This can result in serious bone fractures. Your risk for osteoporosis can be identified using a bone density scan.  If you are 68 years of age or older, or if you are at risk for osteoporosis and fractures, ask your health care provider if you should be screened.  Ask your health care provider whether you should take a calcium or vitamin D supplement to lower your risk for osteoporosis.  Menopause may have certain physical symptoms and risks.  Hormone replacement therapy may reduce some of these symptoms and risks. Talk to your health care provider about whether hormone replacement therapy is right for you.  HOME CARE INSTRUCTIONS   Schedule regular health, dental, and eye exams.  Stay current with your immunizations.   Do not use any tobacco products including cigarettes, chewing tobacco, or electronic cigarettes.  If you are pregnant, do not drink alcohol.  If you are  breastfeeding, limit how much and how often you drink alcohol.  Limit alcohol intake to no more than 1 drink per day for nonpregnant women. One drink equals 12 ounces of beer, 5 ounces of wine, or 1 ounces of hard liquor.  Do not use street drugs.  Do not share needles.  Ask your health care provider for help if you need support or information about quitting drugs.  Tell your health care provider if you often feel depressed.  Tell your health care provider if you have ever been abused or do not feel safe at home. Document Released: 05/06/2011 Document Revised: 03/07/2014 Document Reviewed: 09/22/2013 [facility] Patient Information 2015 [place], [place]. This information is not intended to replace advice given to you by your health care provider. Make sure you discuss any questions you have with your health care provider.

## 2014-12-21 NOTE — Assessment & Plan Note (Signed)
Will await follow-up.

## 2014-12-21 NOTE — Assessment & Plan Note (Signed)
Well-controlled.  Continue current regimen. 

## 2014-12-21 NOTE — Assessment & Plan Note (Signed)
10 year ASCVD risk score 9.9%. Discussed recommendation of starting statin with pt today. She is agreeable. Discussed possible side effects. Rx sent in for lipitor 40mg  daily.

## 2014-12-21 NOTE — Progress Notes (Signed)
This encounter was created in error - please disregard.  Patient presented for an appointment with her PCP prior to her [place].  Her appointment with her PCP took too long for her to attend her [place] appointment (transportation issues on her end).  Will await follow-up from her.

## 2014-12-21 NOTE — Progress Notes (Signed)
Patient ID: Carla Hanson, female   DOB: May 04, 1966, 49 y.o.   MRN: [id number] HPI:  Patient presents today for a well woman exam. It is my first time meeting her.  Concerns today: nothing specific Periods: just had period last month, has skipped a month. Had two periods in December then one in January. Contraception: abstinent during last year Pelvic symptoms: no pelvic pain, has had some vaginal discharge, no itching/burning or odor Sexual activity: last sexual activity over a year ago, had just one female partner STD Screening: would like gc/chlamydia/trich/HIV/RPR testing done today Pap smear status: due to today, no abnormal paps in past Exercise: none Smoking: current smoker Alcohol: occasional social drinking Drugs: none Advance directives: would want to be full code  ROS: See HPI. No CP or SOB  PMFSH:  Cancers in family: none  PHYSICAL EXAM: BP 133/76 mmHg  Pulse 60  Temp(Src) 97.8 F (36.6 C) (Oral)  Ht 5\' 4"  (1.626 m)  Wt 285 lb 4.8 oz (129.411 kg)  BMI 48.95 kg/m2  LMP 12/12/2014 (Approximate) Gen: NAD, pleasant, cooperative HEENT: NCAT, PERRL, no palpable thyromegaly or anterior cervical lymphadenopathy Heart: RRR, no murmurs Lungs: CTAB, NWOB Abdomen: soft, nontender to palpation. Morbidly obese. Neuro: grossly nonfocal, speech normal GU: normal appearing external genitalia without lesions. Vagina is moist with frothy white discharge. Cervix with possible lesion at [facility] position near os (difficult to tell if this was abnormal cervix or discharge, despite multiple attempts at removal with foxswabs). No cervical motion tenderness or tenderness on bimanual exam. No adnexal masses.   ASSESSMENT/PLAN:  Health Maintenance -STD screening: will check gc/chlamydia/trichomonas, HIV, RPR, acute hepatitis panel today  -pap smear: done today. Also refer to [place] [facility] for better exam/visualization of cervix given possible lesion seen during pelvic exam today -mammogram:  gave handout on how to schedule -lipid screening: had lipids last month, needs statin (see problem charting below) -immunizations: declines flu shot today. Tetanus appears due October of this year. -menses: discussed that having two periods in December likely represents perimenopausal variation in cycles. If she were to have persistent intermenstrual bleeding would need eval for abnormal uterine bleeding (likely pelvic u/s and endometrial bx). Pt will return if intermenstrual bleeding persists. -advance directives: per discussion with patient, she would want resuscitation -encouraged exercise and smoking cessation -handout given on health maintenance topics  Hyperlipidemia 10 year ASCVD risk score 9.9%. Discussed recommendation of starting statin with pt today. She is agreeable. Discussed possible side effects. Rx sent in for lipitor 40mg  daily.   HYPERTENSION, BENIGN SYSTEMIC Well controlled. Continue current regimen.     FOLLOW UP: Schedule appt with colpo clinic F/u in 3 months for HTN  [name] J. Ardelia Mems, [place]

## 2014-12-22 LAB — HEPATITIS PANEL, ACUTE
HCV Ab: NEGATIVE
Hep A IgM: NONREACTIVE
Hep B C IgM: NONREACTIVE
Hepatitis B Surface Ag: NEGATIVE

## 2014-12-22 LAB — HIV ANTIBODY (ROUTINE TESTING W REFLEX): HIV 1&2 Ab, 4th Generation: NONREACTIVE

## 2014-12-22 LAB — CYTOLOGY - PAP

## 2014-12-22 LAB — RPR

## 2014-12-23 ENCOUNTER — Telehealth: Payer: Self-pay | Admitting: Family Medicine

## 2014-12-23 MED ORDER — METRONIDAZOLE 500 MG PO TABS: 500.0000 mg | ORAL_TABLET | Freq: Two times a day (BID) | ORAL | Status: DC

## 2014-12-23 NOTE — Telephone Encounter (Signed)
Called patient to discuss results of testing. All STD tests were negative. Her Pap smear was normal, but I do recommend she still has a colposcopy evaluation given the possible abnormality that I saw on her cervix. Her wet prep does show signs of bacterial vaginosis, and patient does request treatment. Will send in prescription for metronidazole. Counseled patient on importance of not drinking alcohol while on this medication.  Leeanne Rio, MD

## 2014-12-27 ENCOUNTER — Telehealth: Payer: Self-pay | Admitting: Family Medicine

## 2014-12-27 ENCOUNTER — Telehealth: Payer: Self-pay | Admitting: Psychology

## 2014-12-27 NOTE — Telephone Encounter (Signed)
Called pharmacy and pt picked up rx around 1. Deseree Kennon Holter, CMA

## 2014-12-27 NOTE — Telephone Encounter (Signed)
Carla Hanson called to reschedule her missed [place] appointment.  Offered her three options via VM and will await follow-up.

## 2014-12-27 NOTE — Telephone Encounter (Signed)
She elected the 3/2 at 10:30 appointment.

## 2014-12-27 NOTE — Telephone Encounter (Signed)
Pt called because she said that the pharmacy never received the Flagyl. jw

## 2015-01-04 ENCOUNTER — Ambulatory Visit: Payer: Medicaid Other | Admitting: Psychology

## 2015-02-07 ENCOUNTER — Telehealth: Payer: Self-pay | Admitting: Psychology

## 2015-02-07 NOTE — Telephone Encounter (Signed)
Carla Hanson called to cancel her [facility] appointment for tomorrow stating that she is not feeling well.  She rescheduled for 4/20 at 11:00.

## 2015-02-22 ENCOUNTER — Ambulatory Visit (INDEPENDENT_AMBULATORY_CARE_PROVIDER_SITE_OTHER): Payer: Self-pay | Admitting: Psychology

## 2015-02-22 DIAGNOSIS — F3160 Bipolar disorder, current episode mixed, unspecified: Secondary | ICD-10-CM

## 2015-02-22 NOTE — Progress Notes (Signed)
Carla Hanson presents for follow-up.  Last seen in early January.  She is irritable, aggravated and tired today.  She reports that she thinks her aggravation is tied to her lack of sleep.  Hasn't "slept" in four days.  Maybe and hour or two.  Out of Trazodone since November and recently started really struggling with sleep.  Denies other difficulty but does cite stress with caring for her grandson.

## 2015-02-22 NOTE — Patient Instructions (Signed)
Please schedule a follow-up for:  May 18th at 11:30. Hopefully you can get your medicine at [facility].  Please call me at [phone] and let me know how you are doing.

## 2015-02-22 NOTE — Assessment & Plan Note (Signed)
Report of mood is irritable.  She appears the same.  Over the course of the visit, she does brighten a bit and smiles appropriately.  She says Trazodone is $15 a month and she can't afford it.  She has not tried over the counter aids; treatment team thinks they would approach the cost of the Trazodone and the latter has a proven track record.    Determined that [facility] has 150 mg for about $11.  She can take 50 to 150 mg as needed for sleep.  She said she would work hard to get this medicine because she thinks it is central to her feeling better.  We attempted to discern if there were other issues at play.  She kept coming back to the sleep issue.  We were reassured that her affect brightened some.  She has a tendency to present as guarded or irritable but she seemed especially so today.    Will follow in one month.  I asked her to call in between to let me know how she is doing.

## 2015-03-22 ENCOUNTER — Ambulatory Visit: Payer: Medicaid Other | Admitting: Psychology

## 2015-04-03 ENCOUNTER — Emergency Department (HOSPITAL_COMMUNITY)
Admission: EM | Admit: 2015-04-03 | Discharge: 2015-04-03 | Disposition: A | Discharge status: Home / Self Care | Payer: Medicaid Other | Attending: Emergency Medicine | Admitting: Emergency Medicine

## 2015-04-03 ENCOUNTER — Encounter (HOSPITAL_COMMUNITY): Payer: Self-pay | Admitting: Emergency Medicine

## 2015-04-03 ENCOUNTER — Emergency Department (HOSPITAL_BASED_OUTPATIENT_CLINIC_OR_DEPARTMENT_OTHER)
Admission: EM | Admit: 2015-04-03 | Discharge: 2015-04-03 | Disposition: A | Discharge status: Home / Self Care | Payer: Medicaid Other | Source: Home / Self Care

## 2015-04-03 DIAGNOSIS — Z720 Tobacco use: Secondary | ICD-10-CM | POA: Insufficient documentation

## 2015-04-03 DIAGNOSIS — F419 Anxiety disorder, unspecified: Secondary | ICD-10-CM | POA: Insufficient documentation

## 2015-04-03 DIAGNOSIS — Z79899 Other long term (current) drug therapy: Secondary | ICD-10-CM | POA: Insufficient documentation

## 2015-04-03 DIAGNOSIS — M79609 Pain in unspecified limb: Secondary | ICD-10-CM

## 2015-04-03 DIAGNOSIS — Z86018 Personal history of other benign neoplasm: Secondary | ICD-10-CM | POA: Insufficient documentation

## 2015-04-03 DIAGNOSIS — E785 Hyperlipidemia, unspecified: Secondary | ICD-10-CM | POA: Insufficient documentation

## 2015-04-03 DIAGNOSIS — I10 Essential (primary) hypertension: Secondary | ICD-10-CM | POA: Insufficient documentation

## 2015-04-03 DIAGNOSIS — Z7901 Long term (current) use of anticoagulants: Secondary | ICD-10-CM | POA: Insufficient documentation

## 2015-04-03 DIAGNOSIS — F319 Bipolar disorder, unspecified: Secondary | ICD-10-CM | POA: Insufficient documentation

## 2015-04-03 DIAGNOSIS — M79605 Pain in left leg: Secondary | ICD-10-CM

## 2015-04-03 DIAGNOSIS — E669 Obesity, unspecified: Secondary | ICD-10-CM | POA: Insufficient documentation

## 2015-04-03 MED ORDER — METHOCARBAMOL 500 MG PO TABS: 1000.0000 mg | ORAL_TABLET | Freq: Four times a day (QID) | ORAL | Status: DC

## 2015-04-03 NOTE — ED Notes (Signed)
Pt c/o L hip/lateral upper thigh pain x 1 week. Pt denies injury. Pt sts "It feels like a charley horse that just will not let up." Pt A&Ox4 and ambulatory. Denies numbness or tingling. Pt sts pain eases with movement.

## 2015-04-03 NOTE — Discharge Instructions (Signed)
Please read and follow all provided instructions.  Your diagnoses today include:  1. Left leg pain    Tests performed today include:  Vital signs - see below for your results today  Ultrasound - does not show any blood clots in the leg   Medications prescribed:   Robaxin (methocarbamol) - muscle relaxer medication  DO NOT drive or perform any activities that require you to be awake and alert because this medicine can make you drowsy.   Take any prescribed medications only as directed.  Home care instructions:   Follow any educational materials contained in this packet  Please rest, use ice or heat on your back for the next several days  Do not lift, push, pull anything more than 10 pounds for the next week  Follow-up instructions: Please follow-up with your primary care provider in the next 1 week for further evaluation of your symptoms.   Return instructions:  SEEK IMMEDIATE MEDICAL ATTENTION IF YOU HAVE:  New numbness, tingling, weakness, or problem with the use of your arms or legs  Severe back pain not relieved with medications  Loss control of your bowels or bladder  Increasing pain in any areas of the body (such as chest or abdominal pain)  Shortness of breath, dizziness, or fainting.   Worsening nausea (feeling sick to your stomach), vomiting, fever, or sweats  Any other emergent concerns regarding your health   Additional Information:  Your vital signs today were: BP 114/73 mmHg   Pulse 66   Temp(Src) 98.3 F (36.8 C) (Oral)   Resp 18   SpO2 98%   LMP 12/12/2014 (Approximate) If your blood pressure (BP) was elevated above 135/85 this visit, please have this repeated by your doctor within one month. --------------

## 2015-04-03 NOTE — ED Notes (Signed)
Bed: WA11 Expected date:  Expected time:  Means of arrival:  Comments: Hold for [facility] 

## 2015-04-03 NOTE — ED Provider Notes (Signed)
CSN: [id number]     Arrival date & time 04/03/15  1418 History   First MD Initiated Contact with Patient 04/03/15 1633     Chief Complaint  Patient presents with  . 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 No new injury. No swelling of the lower extremity. Patient states it feels like a muscle spasm that will not let go. She states that she is concerned and wants to make sure that she does not another blood clot. No weakness, numbness, or tingling in her lower leg. Patient states that the pain is worse in the mornings after she gets up. It improves with activity. Patient denies warning symptoms of back pain including: fecal incontinence, urinary retention or overflow incontinence, night sweats, waking from sleep with back pain, unexplained fevers or weight loss, h/o cancer, IVDU.    Patient is a 49 y.o. female presenting with leg pain. The history is provided by the patient and medical records.  Leg Pain Associated symptoms: no fever     Past Medical History  Diagnosis Date  . Hypertension   . Lipoma     Abdomen  . Obesity   . Hyperlipidemia   . Tobacco abuse   . LIPOMA 01/20/2008  . Anxiety   . Bipolar disorder    Past Surgical History  Procedure Laterality Date  . Lipoma removed  03/2011  . Cesarean section    . Orif ankle fracture  03/13/2012    Procedure: OPEN REDUCTION INTERNAL FIXATION (ORIF) ANKLE FRACTURE;  Surgeon: Jessy Oto, MD;  Location: [facility];  Service: Orthopedics;  Laterality: Right;   Family History  Problem Relation Age of Onset  . Asthma Father    History  Substance Use Topics  . Smoking status: Light Tobacco Smoker -- 0.30 packs/day for 25 years    Types: Cigarettes    Last Attempt to Quit: 09/05/2013  . Smokeless  tobacco: Never Used     Comment: in process of quitting on Chantix  . Alcohol Use: 0.0 oz/week    0 Standard drinks or equivalent per week     Comment: 2 beer / week   OB History    No data available     Review of Systems  Constitutional: Negative for fever.  HENT: Negative for rhinorrhea and sore throat.   Eyes: Negative for redness.  Respiratory: Negative for cough.   Cardiovascular: Negative for chest pain.  Gastrointestinal: Negative for nausea, vomiting, abdominal pain and diarrhea.  Genitourinary: Negative for dysuria.  Musculoskeletal: Positive for myalgias. Negative for arthralgias.  Skin: Negative for rash.  Neurological: Negative for headaches.    Allergies  Review of patient's allergies indicates no known allergies.  Home Medications   Prior to Admission medications   Medication Sig Start Date End Date Taking? Authorizing Provider  lisinopril-hydrochlorothiazide (PRINZIDE,ZESTORETIC) 20-25 MG per tablet Take 1 tablet by mouth daily. 11/08/14  Yes Archie Patten, MD  Lurasidone HCl (LATUDA) 60 MG TABS Take 60 mg by mouth daily.  11/24/13 04/03/15 Yes Historical Provider, MD  traZODone (DESYREL) 50 MG tablet Take 50 mg by mouth at bedtime. Take one pill at night to help with sleep.  #30 with 11 refills per Dr. Tammi Klippel. 08/24/14 08/25/15 Yes Historical Provider, MD  atorvastatin (LIPITOR) 40 MG tablet Take 1 tablet (40 mg total) by mouth daily.  Patient not taking: Reported on 04/03/2015 12/21/14   Leeanne Rio, MD  metroNIDAZOLE (FLAGYL) 500 MG tablet Take 1 tablet (500 mg total) by mouth 2 (two) times daily. For 7 days. Do not drink alcohol while taking this medicine. Patient not taking: Reported on 04/03/2015 12/23/14   Leeanne Rio, MD  Rivaroxaban (XARELTO) 20 MG TABS tablet Take 1 tablet (20 mg total) by mouth daily with supper. Patient not taking: Reported on 11/08/2014 09/29/13   Melony Overly, MD   BP 170/68 mmHg  Pulse 81  Temp(Src) 98.3 F (36.8 C)  (Oral)  Resp 18  SpO2 98%  LMP 12/12/2014 (Approximate)   Physical Exam  Constitutional: She appears well-developed and well-nourished.  HENT:  Head: Normocephalic and atraumatic.  Mouth/Throat: Oropharynx is clear and moist.  Eyes: Conjunctivae are normal. Right eye exhibits no discharge. Left eye exhibits no discharge.  Neck: Normal range of motion. Neck supple.  Cardiovascular: Normal rate, regular rhythm and normal heart sounds.   No murmur heard. Pulses:      Dorsalis pedis pulses are 2+ on the right side, and 2+ on the left side.       Posterior tibial pulses are 2+ on the right side, and 2+ on the left side.  Pulmonary/Chest: Effort normal and breath sounds normal. No respiratory distress. She has no wheezes. She has no rales.  Abdominal: Soft. There is no tenderness.  Musculoskeletal: She exhibits no edema or tenderness.  No gross tenderness or swelling of left lower extremity, including left lateral thigh.  Neurological: She is alert.  Skin: Skin is warm and dry.  Psychiatric: She has a normal mood and affect.  Nursing note and vitals reviewed.   ED Course  Procedures (including critical care time) Labs Review Labs Reviewed - No data to display  Imaging Review No results found.   EKG Interpretation None       4:49 PM Patient seen and examined. Work-up initiated.    Vital signs reviewed and are as follows: BP 170/68 mmHg  Pulse 81  Temp(Src) 98.3 F (36.8 C) (Oral)  Resp 18  SpO2 98%  LMP 12/12/2014 (Approximate)  7:17 PM US performed and is negative. Patient informed of results. Will discharge to home with Robaxin. Encouraged warm compresses, heating pad as well as stretching and massage.  Patient counseled on proper use of muscle relaxant medication.  They were told not to drink alcohol, drive any vehicle, or do any dangerous activities while taking this medication.  Patient verbalized understanding.   MDM   Final diagnoses:  Left leg pain    Patient with L thigh pain. History of DVT, no DVT today. No signs of cellulitis. Full range of motion of hip. Tenderness is over the lateral aspect of the left thigh. Will treat with Robaxin and have patient follow-up with PCP. No emergent conditions suspected. Patient is ambulatory. Lower extremity is neurovascularly intact. No dangerous or life-threatening conditions suspected or identified by history, physical exam, and by work-up. No indications for hospitalization identified.      Carlisle Cater, PA-C 04/03/15 [place], MD 04/03/15 [phone]

## 2015-04-03 NOTE — ED Notes (Signed)
PA at bedside.

## 2015-04-03 NOTE — Progress Notes (Signed)
VASCULAR LAB PRELIMINARY  PRELIMINARY  PRELIMINARY  PRELIMINARY  Left lower extremity venous Doppler completed.    Preliminary report:  There is no DVT or SVT noted in the left lower extremity.   Lashunta Frieden, RVT 04/03/2015, 6:47 PM

## 2015-04-07 ENCOUNTER — Ambulatory Visit: Payer: Medicaid Other | Admitting: Family Medicine

## 2015-04-19 ENCOUNTER — Ambulatory Visit: Payer: Medicaid Other | Admitting: Psychology

## 2015-04-20 ENCOUNTER — Telehealth: Payer: Self-pay | Admitting: Psychology

## 2015-04-20 NOTE — Telephone Encounter (Signed)
Carla Hanson called to see when her appointment was.  It had already passed.  Rescheduled for 7/20 at 10:30.  If she fails to show again, will let her know that we will be unable to see her back in the clinic with another [facility].

## 2015-05-05 ENCOUNTER — Other Ambulatory Visit: Payer: Self-pay | Admitting: Family Medicine

## 2015-05-05 NOTE — Telephone Encounter (Signed)
Hello! It looks like Dr. Mingo Amber prescribed this medication today for this patient and that I have an appointment with her on July 20th. Is the request for a 90-day prescription instead of 30 day supply? I was hesitant to prescribe it if Dr. Mingo Amber only did a 30-day supply because of no-show visits. Or is it that since I am her PCP, I ultimately must approve it. If so, I'll approve.  Thank you, [facility]

## 2015-05-24 ENCOUNTER — Ambulatory Visit (INDEPENDENT_AMBULATORY_CARE_PROVIDER_SITE_OTHER): Payer: Self-pay | Admitting: Psychology

## 2015-05-24 DIAGNOSIS — F319 Bipolar disorder, unspecified: Secondary | ICD-10-CM

## 2015-05-24 NOTE — Assessment & Plan Note (Signed)
Affect is brighter than usual - less irritable.  She looks more relaxed.  She seems less guarded.  She reports she hasn't had anything to drink since the weekend.  Used MI strategies to assess motivation to change.  Currently, she is not interested in making any changes in smoking or drinking.  A self-identified warning sign that she might need to make a change is increased use.  She accepted the information about health consequences.  Not sure if it had any impact.    Initially, treatment team which included Dr. Dallas Schimke and myself (Dr. Tammi Klippel was unable to attend), thought that patient's mood had improved from previous visits but upon learning of the increased substance use, we question whether self-medication is playing a role.    Patient reports she is interested in getting Trazodone filled.  I told her I would discuss with Dr. Tammi Klippel and call in a script to [facility] if he thought it was reasonable given the information she provided today.  Discussed how alcohol tends to make people sleepy but tends to interfere with restorative sleep and therefore, Trazodone would be a better way to try to get some rest.  She voiced an understanding.    She requested follow-up in one month.  See patient instructions for further plan.

## 2015-05-24 NOTE — Progress Notes (Signed)
Carla Hanson presents for follow-up.  She was last seen in April.  At that time, she was having a difficult time with insomnia that was affecting her mood.  She had taken Trazodone before but stopped secondary to cost.  Determined that she could get it at [facility] for a reasonable price and she agreed to this plan.  Today she mentions she lost the script and never got it filled.  She has been drinking beer regularly and occasionally using it as a sleep aide.  Discussed.  Overall she reports an improvement in her mood since our last meeting.  Irritability is significantly better.  Mood is reported as depressed to a bothersome degree 4 / 7 days most of the day.  She cleans as a way to get out of a bad mood.  Her grandson is about one year old now and it has gotten easier to watch him.    She notes she has picked up smoking.  She can not name anything this does for her initially but eventually says it calms her nerves.  Reports smoking about 5 a day.  She also mentions she is drinking beer about 6 days a week - anywhere from 36 to 50 ounces daily.  She reports she likes beer.  She volunteers that she does not get drunk.  Denies a history of alcohol abuse.  She does not know how many drinks per week puts women at risk for health related problems.

## 2015-05-24 NOTE — Patient Instructions (Signed)
Please schedule a follow-up for August 17th at 10:30.  Call me at [phone] if you can not make it. I will check with Dr. Tammi Klippel about phoning in that script to [facility] for Trazodone.  I'll call you if there is an issue with this. We talked about your smoking and drinking.  It sounds like these are not things you wish to change right now.  Increasing use of cigarettes are beer was a warning sign you identified to take a closer look.

## 2015-05-25 NOTE — Progress Notes (Signed)
Discussed with Dr. Tammi Klippel who agreed with Trazodone prescription.  I called in 50 mg #60 with five refills to [facility] and called and left Jamiyla a VM.

## 2015-05-25 NOTE — Addendum Note (Signed)
Addended by: Zella Ball E on: 05/25/2015 11:47 AM   Modules accepted: Medications

## 2015-05-30 ENCOUNTER — Other Ambulatory Visit: Payer: Self-pay

## 2015-06-12 ENCOUNTER — Encounter: Payer: Self-pay | Admitting: Family Medicine

## 2015-06-12 ENCOUNTER — Ambulatory Visit (HOSPITAL_COMMUNITY)
Admission: RE | Admit: 2015-06-12 | Discharge: 2015-06-12 | Disposition: A | Discharge status: Home / Self Care | Payer: Medicaid Other | Source: Ambulatory Visit | Attending: Family Medicine | Admitting: Family Medicine

## 2015-06-12 ENCOUNTER — Ambulatory Visit (INDEPENDENT_AMBULATORY_CARE_PROVIDER_SITE_OTHER): Payer: Self-pay | Admitting: Family Medicine

## 2015-06-12 VITALS — BP 158/78 | HR 83 | Temp 98.4°F | Ht 64.0 in | Wt 287.6 lb

## 2015-06-12 DIAGNOSIS — R059 Cough, unspecified: Secondary | ICD-10-CM

## 2015-06-12 DIAGNOSIS — R0989 Other specified symptoms and signs involving the circulatory and respiratory systems: Secondary | ICD-10-CM | POA: Insufficient documentation

## 2015-06-12 DIAGNOSIS — R05 Cough: Secondary | ICD-10-CM

## 2015-06-12 DIAGNOSIS — R0602 Shortness of breath: Secondary | ICD-10-CM | POA: Insufficient documentation

## 2015-06-12 DIAGNOSIS — F17200 Nicotine dependence, unspecified, uncomplicated: Secondary | ICD-10-CM | POA: Insufficient documentation

## 2015-06-12 MED ORDER — AMOXICILLIN 500 MG PO TABS: 500.0000 mg | ORAL_TABLET | Freq: Three times a day (TID) | ORAL | Status: DC

## 2015-06-12 MED ORDER — GUAIFENESIN ER 600 MG PO TB12: 600.0000 mg | ORAL_TABLET | Freq: Two times a day (BID) | ORAL | Status: DC | PRN

## 2015-06-12 NOTE — Progress Notes (Signed)
Patient ID: Carla Hanson, female   DOB: January 16, 1966, 49 y.o.   MRN: [id number]  HPI:  Pt presents for a same day appointment to discuss cough and congestion.  Began having cold symptoms a few weeks ago. States cough worsening over the last 2 weeks. Productive of thick yellow sputum. Feels congested in her nose. R ear has been very painful for 2 days. No vomiting, fever, or rash. No tick exposures. Eating and drinking normally.  Has tried OTC medicines without relief.  ROS: See HPI  [place]: hx hLD, obesity, bipolar, tobacco abuse, HTN, hx DVT/PE  PHYSICAL EXAM: BP 158/78 mmHg  Pulse 83  Temp(Src) 98.4 F (36.9 C) (Oral)  Ht 5\' 4"  (1.626 m)  Wt 287 lb 9.6 oz (130.455 kg)  BMI 49.34 kg/m2  LMP 06/06/2015 Gen: NAD, appears as though she does not feel well HEENT: NCAT. MMM. L TM clear. R TM with erythema. Nares with some congestion. Oropharynx clear. Heart: RRR, no murmur Lungs: CTAB. Normal respiratory effort. Frequent cough. No wheezes or crackles auscultated Neuro: grossly nonfocal, speech normal  ASSESSMENT/PLAN:  1. URI sx's - possibly viral in etiology, but given duration of sx's over 2 weeks, warrants further investigation. She may have R otitis media given complaint of severe R ear pain and erythema noted on exam (although no bulging or obvious pus behind drum). Worsening cough also concerning for PNA. Will obtain CXR and rx amoxicillin to cover for both CAP and AOM. Also rx albuterol. F/u later this week if not improving, sooner if worsening.  FOLLOW UP: F/u as needed if symptoms worsen or do not improve.   [place]. Ardelia Mems, [place]

## 2015-06-12 NOTE — Patient Instructions (Signed)
Take amoxicillin 500mg  every 8 hours for 7 days This will treat for ear infection or pneumonia Please also take mucinex twice a day as needed to help with cough Drink plenty of liquids Go get chest xray - will call or send letter with results Follow up later this week if not better  Be well, Dr. Ardelia Mems

## 2015-06-13 ENCOUNTER — Encounter: Payer: Self-pay | Admitting: Family Medicine

## 2015-06-16 ENCOUNTER — Telehealth: Payer: Self-pay | Admitting: Psychology

## 2015-06-16 NOTE — Telephone Encounter (Signed)
Called Carla Hanson to reschedule her appointment from 8/17 to 9/7 secondary to a schedule issue on our end.  She is still not feeling well but says she thinks the ABX are helping (recently seen in [facility] for URI symptoms).  I asked if it was okay to switch her appointment and she said yes - new appointment is 9/7 at 11:00.

## 2015-06-20 ENCOUNTER — Other Ambulatory Visit: Payer: Self-pay | Admitting: Family Medicine

## 2015-06-21 ENCOUNTER — Other Ambulatory Visit: Payer: Self-pay | Admitting: Internal Medicine

## 2015-06-21 ENCOUNTER — Ambulatory Visit: Payer: Medicaid Other | Admitting: Psychology

## 2015-06-21 DIAGNOSIS — I10 Essential (primary) hypertension: Secondary | ICD-10-CM

## 2015-06-21 MED ORDER — LISINOPRIL-HYDROCHLOROTHIAZIDE 20-25 MG PO TABS: 1.0000 | ORAL_TABLET | Freq: Every day | ORAL | Status: DC

## 2015-06-21 NOTE — Telephone Encounter (Signed)
Pt calling because she needs her lisinopril refilled and sent to [facility] on [address]. Sadie Reynolds, ASA

## 2015-07-12 ENCOUNTER — Ambulatory Visit (INDEPENDENT_AMBULATORY_CARE_PROVIDER_SITE_OTHER): Payer: Self-pay | Admitting: Psychology

## 2015-07-12 DIAGNOSIS — F3130 Bipolar disorder, current episode depressed, mild or moderate severity, unspecified: Secondary | ICD-10-CM

## 2015-07-12 NOTE — Patient Instructions (Signed)
Please schedule an appointment for October 5th at 10:00 for Dr. Gwenlyn Saran. We talked about choices today.  You have some tough ones.  Check in with yourself to see if your choices are getting you closer to or further away from feeling well.

## 2015-07-12 NOTE — Assessment & Plan Note (Signed)
Treatment team felt about as stuck as the patient was reportedly feeling.  We used MI strategies including a lot of reflection.  Caedence was contradictory on occasion and also agreeable reflecting a great deal of ambivalence.  In the end, we acknowledged that she had choices before her albeit hard ones.  Specifically, Juletta thinks the Trazodone is very important in terms of her well-being and from her report, it sounds like she could afford to buy it if she made some changes in smoking habits or asked for help from her friends that buy her beer.  Dr. Tammi Klippel, when asked, explained that he thought the amount of alcohol she was drinking could negatively impact her mood.  We discussed options here but she is not interested in change presently.  When asked whether she wanted to call to make another appointment, she elected to schedule one for one month out.    No medication changes. She likely needs a therapist but has no way to pay for it currently.  Even a sliding fee schedule would be cost-prohibitive.  She did point out that she "talks to [place] and he's free."  Endorsed that and the trazodone as a sleep aide as potentially the next best steps.

## 2015-07-12 NOTE — Progress Notes (Signed)
Carla Hanson presents for follow-up.  Last appointment was 05/24/2015.  She states today she is feeling aggravated. Upon questioning, she does not know what she wants to talk about and does not know what she would like from the visit.  She was resistant to further questioning for the first 5 or so minutes of the visit.  She did state she has something she needs to talk about but it is too deep to talk about today.  She is afraid it hurts too much.  She thinks this might be a contributor to her irritability but she isn't sure.  She would like trazodone and hasn't been able to afford the $11 to get it.  She is drinking anywhere from 6 to 12 beers nightly to go to sleep.  Her friends reportedly by her beer.  [name] is buying her own cigarettes however and reports smoking about a 1/2 pack per day.  She also recently smoked some marijuana and this "mellowed" her out.

## 2015-07-21 ENCOUNTER — Telehealth: Payer: Self-pay | Admitting: Internal Medicine

## 2015-07-21 ENCOUNTER — Encounter: Payer: Self-pay | Admitting: Internal Medicine

## 2015-07-21 NOTE — Telephone Encounter (Signed)
Called patient at the number listed to inform her that her Medicaid is only "Family Planning", meaning that her next visit with Dr. Gwenlyn Saran will not be covered. I needed to explain to her that if she is unable to qualify for the full benefits that Medicaid can offer then she would need to get into contact with Pamala Hurry, our [employer], so that she can pursue coverage. Otherwise, she most likely will be turned away at the front desk. Sadie Reynolds, ASA

## 2015-07-25 NOTE — Telephone Encounter (Signed)
Dr. Gwenlyn Saran - FYI   Attempted to contact pt once more to explain message below. No answer; left VM once more. Sadie Reynolds, ASA

## 2015-07-27 ENCOUNTER — Telehealth: Payer: Self-pay | Admitting: Internal Medicine

## 2015-07-28 NOTE — Telephone Encounter (Signed)
Error

## 2015-07-28 NOTE — Telephone Encounter (Signed)
Pt returned call. I informed pt of message below. She states that she contacted Medicaid and they should have fixed it. I asked when she contacted them and she stated that it was just last week. I informed her that the change should take place on "our end" by October and to keep her from wasting any time she is encouraged to call at least by 10/4 before her appt on 10/5 to ensure the change took place. Patient expressed understanding.   Dr. Gwenlyn Saran- [address] ASA

## 2015-08-09 ENCOUNTER — Ambulatory Visit: Payer: Medicaid Other | Admitting: Psychology

## 2015-08-09 ENCOUNTER — Telehealth: Payer: Self-pay | Admitting: Psychology

## 2015-08-09 NOTE — Telephone Encounter (Signed)
Carla Hanson left a VM to state she could not attend her appointment today.  She asked to reschedule.  I called her back and left a VM.

## 2015-08-09 NOTE — Telephone Encounter (Signed)
Carla Hanson returned my phone call.  She rescheduled for the 19th at 10:30.  She said she has the insurance issue resolved - hasn't gotten her new card yet but expects it soon.

## 2015-08-17 ENCOUNTER — Telehealth: Payer: Self-pay | Admitting: Psychology

## 2015-08-17 NOTE — Telephone Encounter (Signed)
Jaquel called to her cancel her upcoming [place] appointment.  On her VM, I offered her 11/2 at 10:30.

## 2015-08-23 ENCOUNTER — Ambulatory Visit: Payer: Medicaid Other | Admitting: Psychology

## 2015-09-06 ENCOUNTER — Ambulatory Visit (INDEPENDENT_AMBULATORY_CARE_PROVIDER_SITE_OTHER): Payer: Self-pay | Admitting: Psychology

## 2015-09-06 DIAGNOSIS — F3130 Bipolar disorder, current episode depressed, mild or moderate severity, unspecified: Secondary | ICD-10-CM

## 2015-09-06 NOTE — Patient Instructions (Signed)
Please schedule a follow-up for:  January 4th at 10:30.   We hope the Trazodone prescription helps with your sleep.  Decreasing the amount of alcohol that you drink would likely help your sleep.

## 2015-09-06 NOTE — Progress Notes (Signed)
Carla Hanson presents for follow-up.  She reports she is feeling sleepy today.  She has nothing for the agenda.  I set the agenda to talk about trazodone, alcohol, smoking and mood.  She had nothing much to say about any of these topics but responded to questions.  Drinking beer every day - 6 per night in order to get to sleep.  Smokes "when she can" to relieve stress.  Sometimes buys cigarettes.  Reports her friends continue to buy her beer.  She did open up a bit later in the visit and talked about her 77 year old daughter is moving out of the apartment to an apartment in the complex.  Carla Hanson will continue to take care of her 72 month old grandson.

## 2015-09-06 NOTE — Assessment & Plan Note (Signed)
Met a fair amount of resistance again today.  It is difficult to discern what keeps her coming back although she did ask today if we had heard anything about disability paperwork.  Dr. Manning thought it reasonable to try to help her secure Trazodone for at least one month to see if she notices a difference.  He was able to evoke from her that she might drink less beer if she had another alternative to getting to sleep.  We did discuss the negative effects beer has on sleep but this is not Carla Hanson's experience thus far.  She is not interested in changing other behaviors presently.  It was nice to hear her talk about her living situation changing.  She was appropriately tearful and much less guarded when discussing this.  I brought up the fact she was struggling with something "too deep to discuss" last visit (two months ago).  She said she still didn't want to talk about it but it was much better.    Will follow in early January.  Offered her December appointments but she did not want to take them.   

## 2015-11-07 ENCOUNTER — Telehealth: Payer: Self-pay | Admitting: Psychology

## 2015-11-07 NOTE — Telephone Encounter (Signed)
Patient called to cancel her [facility] appointment for tomorrow stating that her mother wasn't feeling well.  I called her back and left a VM asking her to call to reschedule if she would like.

## 2015-11-08 ENCOUNTER — Ambulatory Visit: Payer: Medicaid Other | Admitting: Psychology

## 2015-11-15 NOTE — Telephone Encounter (Signed)
Connected with patient and scheduled follow-up [place] appointment for 2/15 at 9:30.

## 2015-12-08 ENCOUNTER — Telehealth: Payer: Self-pay | Admitting: Psychology

## 2015-12-08 NOTE — Telephone Encounter (Signed)
Patient left a VM.  She needs more Latuda but needs to be re-enrolled in their financial assistance program.  Shari Heritage is coming.  Wonders about samples.  I am checking with Dr. Tammi Klippel and let her know.

## 2015-12-20 ENCOUNTER — Ambulatory Visit (INDEPENDENT_AMBULATORY_CARE_PROVIDER_SITE_OTHER): Payer: Self-pay | Admitting: Psychology

## 2015-12-20 DIAGNOSIS — F3175 Bipolar disorder, in partial remission, most recent episode depressed: Secondary | ICD-10-CM

## 2015-12-20 NOTE — Progress Notes (Signed)
We were running behind in clinic and Carla Hanson reported feeling very aggravated by this.  She said that generally speaking though, her mood has been up and down since we last saw her and mostly up.  Her daughter and grandson moved out but not far and this has largely been good.  Sleep continues to be a problem with early morning awakening at 3:00 or 3:30.  She drinks beer to get to sleep.  She was tried on Trazodone 50 to 100 mg and she took for four days before she lost the prescription.  She said it helped somewhat and wonders whether a higher dose would be helpful.

## 2015-12-20 NOTE — Patient Instructions (Addendum)
Please schedule a follow-up for:  April 5th at 11:00. Dr. Tammi Klippel thought it was reasonable to increase the Trazodone to 100 mg nightly.  You can also break it in half if you think that works best.  Elbert Ewings on to this prescription! We called the patient assistance company and got the paperwork faxed over and completed.

## 2015-12-20 NOTE — Assessment & Plan Note (Addendum)
Mood is reported as aggravated largely due to her long wait today.  She rebounded reasonably well.  Overall, her report of mood is euthymic.  Functional limitations are hard to assess but she keeps up with household tasks and caring for her grandson.  Fatigue / low energy (she attributes to her poor sleep) gets in the way.  Denies napping.    She reports drinking two to three 40 oz beers daily although she states "she doesn't count them."  This likely contributes to her poor sleep.  As per earlier reports, she is not interested in changing this.  The Trazodone was previously prescribed in hopes that she would sleep better and then decrease her alcohol use.  She lost the pill bottle.    She requested financial assistance to help get the Trazodone (we provided it last time).  I explained that it is one time help with prescription from our indigent care fund.    Asked when she would like to return reminding her she usually says one month and rarely if ever actually attends this appointment (frequent calls to cancel).  She said one month and said she would attend.

## 2016-01-10 ENCOUNTER — Telehealth: Payer: Self-pay | Admitting: Psychology

## 2016-01-10 NOTE — Telephone Encounter (Signed)
[employer] called to say Carla Hanson is missing form 4506T and they need that in order to process her medication assistance.  Talked to [place].  She doesn't have internet access at home.  When she comes in tomorrow for her appointment, we will complete the form and I will fax to [phone].  Dr. Tammi Klippel has provided samples in the meantime.

## 2016-01-11 ENCOUNTER — Ambulatory Visit (INDEPENDENT_AMBULATORY_CARE_PROVIDER_SITE_OTHER): Payer: Self-pay | Admitting: Psychology

## 2016-01-11 DIAGNOSIS — F1021 Alcohol dependence, in remission: Secondary | ICD-10-CM | POA: Insufficient documentation

## 2016-01-11 DIAGNOSIS — F1020 Alcohol dependence, uncomplicated: Secondary | ICD-10-CM

## 2016-01-11 DIAGNOSIS — F3175 Bipolar disorder, in partial remission, most recent episode depressed: Secondary | ICD-10-CM

## 2016-01-11 NOTE — Patient Instructions (Signed)
Please follow-up with me on March 23rd at 10:30. I gave you the name of a book that you mind find helpful.  You can probably order it from [employer] if it is not in.

## 2016-01-11 NOTE — Assessment & Plan Note (Signed)
Last report, patient was drinking 2-3 40 ounce beers daily.  She was doing this because she "likes beer" and it helps her sleep.  Went one day without drinking and ended up having two beers the next morning.  Discussed the risks of alcohol withdrawal including the possibility of death.  She voiced an understanding.  Discussed detox.  Would like to try on her own first.  Unable to identify warning signs that she needs detox.  Provided the example of needing a beer in the morning in order to feel okay again.  Voiced an understanding.  Discussed bibliotherapy and provided her a resource.  Will follow in two weeks to check-in and she has [facility] follow-up after that.

## 2016-01-11 NOTE — Assessment & Plan Note (Signed)
Samples of Latuda arrived in the mail.  She is taking 60 mg per day.  Completed form 4506T which is a verification of not-filing.  I will fax that to the company.    Current alcohol use is likely negatively impacting treatment efficacy.

## 2016-01-11 NOTE — Progress Notes (Signed)
Reason for follow-up:  Carla Hanson presented for integrated care to discuss her alcohol consumption.  She was here with her mother (who had a hospital follow-up appointment) and only had 20 minutes to meet.  Issues discussed:  Paperwork for medication assistance.  Alcohol consumption.  Identified goals:  She would like to quit drinking or cut back.  She does not think she needs detox presently.

## 2016-01-12 ENCOUNTER — Other Ambulatory Visit: Payer: Self-pay | Admitting: Internal Medicine

## 2016-01-12 DIAGNOSIS — I10 Essential (primary) hypertension: Secondary | ICD-10-CM

## 2016-01-25 ENCOUNTER — Ambulatory Visit: Payer: Medicaid Other | Admitting: Psychology

## 2016-02-07 ENCOUNTER — Telehealth: Payer: Self-pay | Admitting: Psychology

## 2016-02-07 ENCOUNTER — Ambulatory Visit: Payer: Medicaid Other | Admitting: Psychology

## 2016-02-07 NOTE — Telephone Encounter (Signed)
I called the patient to ask if she could come in earlier.  Left a VM.  She called me back and said she has so much going on, she would like to reschedule.  Did that for 4/26 at 11:00.  She missed a follow-up with me in between to discuss alcohol abuse.  Will discuss.

## 2016-02-28 ENCOUNTER — Ambulatory Visit: Payer: Self-pay | Admitting: Psychology

## 2016-02-28 ENCOUNTER — Telehealth: Payer: Self-pay | Admitting: Psychology

## 2016-02-28 NOTE — Telephone Encounter (Signed)
Patient was scheduled today.  She arrived on time.  We were running behind.  She left before we were able to see her.  Called and left a VM to discuss.

## 2016-03-18 ENCOUNTER — Encounter: Payer: Medicaid Other | Admitting: Internal Medicine

## 2016-03-20 ENCOUNTER — Encounter: Payer: Medicaid Other | Admitting: Internal Medicine

## 2016-03-20 ENCOUNTER — Ambulatory Visit: Payer: Medicaid Other | Admitting: Psychology

## 2016-03-21 ENCOUNTER — Telehealth: Payer: Self-pay | Admitting: Psychology

## 2016-03-21 NOTE — Telephone Encounter (Signed)
Patient called on Tuesday to cancel [facility] appointment.  Called her back to reschedule per her request.  She did not feel up to coming in.  Reports she is down.  Doesn't feel like doing much of anything.  States she is no longer drinking.  Socializing with her mother and still takes care of her grandson.  Denies SI.  We can see her back in [facility] on 6/14 at 10:00.  She requested I schedule that appointment for her.  Discussed possibility of her getting into a consistent therapy relationship.  She says she has no insurance and no way to pay.  Will continue to consider options.  [employer] operates on a sliding fee schedule but I do not think this will be a Development worker, international aid for [employer].  ? Whether [facility] might be a better match.    Asked her to call between now and her scheduled appointment as needed.

## 2016-03-31 ENCOUNTER — Emergency Department (HOSPITAL_COMMUNITY)
Admission: EM | Admit: 2016-03-31 | Discharge: 2016-04-01 | Disposition: A | Discharge status: Home / Self Care | Payer: Medicaid Other | Attending: Emergency Medicine | Admitting: Emergency Medicine

## 2016-03-31 ENCOUNTER — Encounter (HOSPITAL_COMMUNITY): Payer: Self-pay | Admitting: Emergency Medicine

## 2016-03-31 ENCOUNTER — Emergency Department (HOSPITAL_COMMUNITY): Payer: Medicaid Other

## 2016-03-31 DIAGNOSIS — Z79899 Other long term (current) drug therapy: Secondary | ICD-10-CM | POA: Insufficient documentation

## 2016-03-31 DIAGNOSIS — I10 Essential (primary) hypertension: Secondary | ICD-10-CM | POA: Insufficient documentation

## 2016-03-31 DIAGNOSIS — Z7952 Long term (current) use of systemic steroids: Secondary | ICD-10-CM | POA: Insufficient documentation

## 2016-03-31 DIAGNOSIS — F17210 Nicotine dependence, cigarettes, uncomplicated: Secondary | ICD-10-CM | POA: Insufficient documentation

## 2016-03-31 DIAGNOSIS — J209 Acute bronchitis, unspecified: Secondary | ICD-10-CM | POA: Insufficient documentation

## 2016-03-31 DIAGNOSIS — F319 Bipolar disorder, unspecified: Secondary | ICD-10-CM | POA: Insufficient documentation

## 2016-03-31 MED ORDER — IPRATROPIUM-ALBUTEROL 0.5-2.5 (3) MG/3ML IN SOLN
3.0000 mL | Freq: Once | RESPIRATORY_TRACT | Status: AC
  Administered 2016-03-31: 3 mL via RESPIRATORY_TRACT
  Filled 2016-03-31: qty 3

## 2016-03-31 NOTE — ED Notes (Signed)
Bed: WA23 Expected date:  Expected time:  Means of arrival:  Comments: shob 

## 2016-03-31 NOTE — ED Provider Notes (Signed)
CSN: [id number]     Arrival date & time 03/31/16  2213 History  By signing my name below, I, Carla Hanson, attest that this documentation has been prepared under the direction and in the presence of Carla Fuel, MD. Electronically Signed: Altamease Hanson, ED Scribe. 03/31/2016. 11:39 PM   Chief Complaint  Patient presents with  . Shortness of Breath   The history is provided by the patient. No language interpreter was used.   Brought in by EMS from home, Carla Hanson is a 50 y.o. female with PMHx of HTN, HLD, and obesity who presents to the Emergency Department complaining of new and constant SOB with onset tonight. Pt states that over the last 3 days she has had chest congestion, a cough productive of yellow sputum, and yellow nasal discharge. Tonight her symptoms progressed to the point that she became short of breath.  Her breathing is exacerbation by exertion. A breathing treatment and Solumedrol by EMS provided some improvement in her symptoms.  Pt denies fever, chills, sweats, and chest pain. She smokes less than 1/2 PPD.    Past Medical History  Diagnosis Date  . Hypertension   . Lipoma     Abdomen  . Obesity   . Hyperlipidemia   . Tobacco abuse   . LIPOMA 01/20/2008  . Anxiety   . Bipolar disorder [facility])    Past Surgical History  Procedure Laterality Date  . Lipoma removed  03/2011  . Cesarean section    . Orif ankle fracture  03/13/2012    Procedure: OPEN REDUCTION INTERNAL FIXATION (ORIF) ANKLE FRACTURE;  Surgeon: Jessy Oto, MD;  Location: [facility] ORS;  Service: Orthopedics;  Laterality: Right;   Family History  Problem Relation Age of Onset  . Asthma Father    Social History  Substance Use Topics  . Smoking status: Light Tobacco Smoker -- 0.30 packs/day for 25 years    Types: Cigarettes    Last Attempt to Quit: 09/05/2013  . Smokeless tobacco: Never Used     Comment: in process of quitting on Chantix  . Alcohol Use: 0.0 oz/week    0 Standard drinks or equivalent  per week     Comment: 2 beer / week   OB History    No data available     Review of Systems  Constitutional: Negative for fever, chills and diaphoresis.  HENT: Positive for congestion and rhinorrhea.   Respiratory: Positive for cough and shortness of breath.   Cardiovascular: Negative for chest pain.  All other systems reviewed and are negative.  Allergies  Review of patient's allergies indicates no known allergies.  Home Medications   Prior to Admission medications   Medication Sig Start Date End Date Taking? Authorizing Provider  LATUDA 60 MG TABS Take 60 mg by mouth daily. Per Dr. Tammi Klippel in [facility].  Called for paperwork from patient assistance to be faxed over.  Patient to complete.  Script written. 12/20/15 04/18/16 Yes Historical Provider, MD  lisinopril-hydrochlorothiazide (PRINZIDE,ZESTORETIC) 20-25 MG tablet TAKE 1 TABLET BY MOUTH DAILY 01/12/16  Yes Hillary Corinda Gubler, MD  traZODone (DESYREL) 100 MG tablet Take 100 mg by mouth at bedtime. #30.  Take one at bedtime.  5 refills.  She can take a half a tablet if that works for her.  Per Dr. Tammi Klippel in [facility]. 12/20/15 05/18/16 Yes Historical Provider, MD  Lurasidone HCl (LATUDA) 60 MG TABS Take 60 mg by mouth daily.  11/24/13 04/03/15  Historical Provider, MD  predniSONE (DELTASONE) 20 MG  tablet Take 2 tablets (40 mg total) by mouth daily. [id number]   Carla Fuel, MD  traZODone (DESYREL) 50 MG tablet Take 50 mg by mouth at bedtime. Take one to two pills at night to help with sleep.  #60 with 5 refills per Dr. Tammi Klippel. 05/25/15 10/26/15  Historical Provider, MD   BP 160/83 mmHg  Pulse 98  Temp(Src) 98.6 F (37 C) (Axillary)  Resp 25  SpO2 99%  LMP 03/10/2016 Physical Exam  Constitutional: She is oriented to person, place, and time. She appears well-developed and well-nourished. No distress.  Obese   HENT:  Head: Normocephalic and atraumatic.  Eyes: EOM are normal. Pupils are equal, round, and reactive to light.  Neck: Normal range  of motion. Neck supple. No JVD present.  Cardiovascular: Normal rate, regular rhythm and normal heart sounds.   Pulmonary/Chest: Effort normal. No respiratory distress. She has no rales. She exhibits no tenderness.  Mild diffuse expiratory wheezes.   Abdominal: Soft. Bowel sounds are normal. She exhibits no distension and no mass. There is no tenderness.  Musculoskeletal: Normal range of motion. She exhibits no edema.  Lymphadenopathy:    She has no cervical adenopathy.  Neurological: She is alert and oriented to person, place, and time. No cranial nerve deficit. She exhibits normal muscle tone. Coordination normal.  Skin: Skin is warm and dry. No rash noted.  Psychiatric: She has a normal mood and affect. Her behavior is normal. Judgment and thought content normal.  Nursing note and vitals reviewed.   ED Course  Procedures (including critical care time) DIAGNOSTIC STUDIES: Oxygen Saturation is 99% on RA,  normal by my interpretation.    COORDINATION OF CARE: 11:17 PM Discussed treatment plan which includes CXR, EKG, and a breathing treatment with pt at bedside and pt agreed to plan.  Labs Review Labs Reviewed - No data to display  Imaging Review Dg Chest 2 View  03/31/2016  CLINICAL DATA:  Acute onset of shortness of breath and cough. Initial encounter. EXAM: CHEST  2 VIEW COMPARISON:  Chest radiograph performed 06/12/2015 FINDINGS: The lungs are well-aerated. Mild vascular congestion is noted. Minimal bibasilar atelectasis is seen. There is no evidence of pleural effusion or pneumothorax. The heart is borderline normal in size. No acute osseous abnormalities are seen. IMPRESSION: Mild vascular congestion noted.  Minimal bibasilar atelectasis seen. Electronically Signed   By: Garald Balding M.D.   On: 03/31/2016 22:53   I have personally reviewed and evaluated these images as part of my medical decision-making.   EKG Interpretation   Date/Time:  Sunday Mar 31 2016 22:30:01  EDT Ventricular Rate:  95 PR Interval:  174 QRS Duration: 100 QT Interval:  377 QTC Calculation: 474 R Axis:   98 Text Interpretation:  Sinus rhythm Borderline right axis deviation  Borderline T abnormalities, anterior leads When compared with ECG of  05/04/2012, No significant change was found Confirmed by [facility]  MD, Tynisha Ogan  ([id number]) on 03/31/2016 11:18:06 PM      MDM   Final diagnoses:  Acute bronchitis with bronchospasm    Cough with wheezing. We'll need to get x-ray to rule out pneumonia. She has had significant improvement with 2 nebulizer treatments in the ambulance and is on he received steroids. Old records were reviewed and she has no similar past visits. Chest x-rays obtained showing no evidence of pneumonia. I do not see indication for antibiotics. She was given albuterol with ipratropium via nebulizer in the ED with complete resolution of wheezing and significant  improvement in the cough. She is given an albuterol inhaler to take home and is sent home with prescription for prednisone.  I personally performed the services described in this documentation, which was scribed in my presence. The recorded information has been reviewed and is accurate.      Carla Fuel, MD [id number]

## 2016-03-31 NOTE — ED Notes (Signed)
Brought in by EMS from home with c/o shortness of breath.  Per EMS, pt reported that she has been having cough and congestion for the past 2 days and tonight, symptoms got worse that she "could hardly breath".  Pt was using accessory muscles with her respirations on EMS' arrival at the scene--- pt has expiratory wheezing throughout her lungs by EMS.  Was given Albuterol 5 mg neb tx initially but it did not resolve shortness of breath.  Pt was then given Solu-Medrol 125 mg IV and repeat neb tx with Albuterol 5 mg and Atrovent 0.5 mg.  Pt denies hx of COPD/Asthma or CHF.

## 2016-04-01 MED ORDER — ALBUTEROL SULFATE HFA 108 (90 BASE) MCG/ACT IN AERS
2.0000 | INHALATION_SPRAY | RESPIRATORY_TRACT | Status: DC | PRN
  Administered 2016-04-01: 2 via RESPIRATORY_TRACT
  Filled 2016-04-01: qty 6.7

## 2016-04-01 MED ORDER — PREDNISONE 20 MG PO TABS: 40.0000 mg | ORAL_TABLET | Freq: Every day | ORAL | Status: DC

## 2016-04-01 NOTE — Discharge Instructions (Signed)
Acute Bronchitis Bronchitis is inflammation of the airways that extend from the windpipe into the lungs (bronchi). The inflammation often causes mucus to develop. This leads to a cough, which is the most common symptom of bronchitis.  In acute bronchitis, the condition usually develops suddenly and goes away over time, usually in a couple weeks. Smoking, allergies, and asthma can make bronchitis worse. Repeated episodes of bronchitis may cause further lung problems.  CAUSES Acute bronchitis is most often caused by the same virus that causes a cold. The virus can spread from person to person (contagious) through coughing, sneezing, and touching contaminated objects. SIGNS AND SYMPTOMS   Cough.   Fever.   Coughing up mucus.   Body aches.   Chest congestion.   Chills.   Shortness of breath.   Sore throat.  DIAGNOSIS  Acute bronchitis is usually diagnosed through a physical exam. Your health care provider will also ask you questions about your medical history. Tests, such as chest X-rays, are sometimes done to rule out other conditions.  TREATMENT  Acute bronchitis usually goes away in a couple weeks. Oftentimes, no medical treatment is necessary. Medicines are sometimes given for relief of fever or cough. Antibiotic medicines are usually not needed but may be prescribed in certain situations. In some cases, an inhaler may be recommended to help reduce shortness of breath and control the cough. A cool mist vaporizer may also be used to help thin bronchial secretions and make it easier to clear the chest.  HOME CARE INSTRUCTIONS  Get plenty of rest.   Drink enough fluids to keep your urine clear or pale yellow (unless you have a medical condition that requires fluid restriction). Increasing fluids may help thin your respiratory secretions (sputum) and reduce chest congestion, and it will prevent dehydration.   Take medicines only as directed by your health care provider.  If  you were prescribed an antibiotic medicine, finish it all even if you start to feel better.  Avoid smoking and secondhand smoke. Exposure to cigarette smoke or irritating chemicals will make bronchitis worse. If you are a smoker, consider using nicotine gum or skin patches to help control withdrawal symptoms. Quitting smoking will help your lungs heal faster.   Reduce the chances of another bout of acute bronchitis by washing your hands frequently, avoiding people with cold symptoms, and trying not to touch your hands to your mouth, nose, or eyes.   Keep all follow-up visits as directed by your health care provider.  SEEK MEDICAL CARE IF: Your symptoms do not improve after 1 week of treatment.  SEEK IMMEDIATE MEDICAL CARE IF:  You develop an increased fever or chills.   You have chest pain.   You have severe shortness of breath.  You have bloody sputum.   You develop dehydration.  You faint or repeatedly feel like you are going to pass out.  You develop repeated vomiting.  You develop a severe headache. MAKE SURE YOU:   Understand these instructions.  Will watch your condition.  Will get help right away if you are not doing well or get worse.   This information is not intended to replace advice given to you by your health care provider. Make sure you discuss any questions you have with your health care provider.   Document Released: 11/28/2004 Document Revised: 11/11/2014 Document Reviewed: 04/13/2013 Elsevier Interactive Patient Education 2016 Else[employer] Inc.  Albuterol inhalation aerosol What is this medicine? ALBUTEROL (al [place]) is a bronchodilator. It helps open  up the airways in your lungs to make it easier to breathe. This medicine is used to treat and to prevent bronchospasm. This medicine may be used for other purposes; ask your health care provider or pharmacist if you have questions. What should I tell my health care provider before I take this  medicine? They need to know if you have any of the following conditions: -diabetes -heart disease or irregular heartbeat -high blood pressure -pheochromocytoma -seizures -thyroid disease -an unusual or allergic reaction to albuterol, levalbuterol, sulfites, other medicines, foods, dyes, or preservatives -pregnant or trying to get pregnant -breast-feeding How should I use this medicine? This medicine is for inhalation through the mouth. Follow the directions on your prescription label. Take your medicine at regular intervals. Do not use more often than directed. Make sure that you are using your inhaler correctly. Ask you doctor or health care provider if you have any questions. Talk to your pediatrician regarding the use of this medicine in children. Special care may be needed. Overdosage: If you think you have taken too much of this medicine contact a poison control center or emergency room at once. NOTE: This medicine is only for you. Do not share this medicine with others. What if I miss a dose? If you miss a dose, use it as soon as you can. If it is almost time for your next dose, use only that dose. Do not use double or extra doses. What may interact with this medicine? -anti-infectives like chloroquine and pentamidine -caffeine -cisapride -diuretics -medicines for colds -medicines for depression or for emotional or psychotic conditions -medicines for weight loss including some herbal products -methadone -some antibiotics like clarithromycin, erythromycin, levofloxacin, and linezolid -some heart medicines -steroid hormones like dexamethasone, cortisone, hydrocortisone -theophylline -thyroid hormones This list may not describe all possible interactions. Give your health care provider a list of all the medicines, herbs, non-prescription drugs, or dietary supplements you use. Also tell them if you smoke, drink alcohol, or use illegal drugs. Some items may interact with your  medicine. What should I watch for while using this medicine? Tell your doctor or health care professional if your symptoms do not improve. Do not use extra albuterol. If your asthma or bronchitis gets worse while you are using this medicine, call your doctor right away. If your mouth gets dry try chewing sugarless gum or sucking hard candy. Drink water as directed. What side effects may I notice from receiving this medicine? Side effects that you should report to your doctor or health care professional as soon as possible: -allergic reactions like skin rash, itching or hives, swelling of the face, lips, or tongue -breathing problems -chest pain -feeling faint or lightheaded, falls -high blood pressure -irregular heartbeat -fever -muscle cramps or weakness -pain, tingling, numbness in the hands or feet -vomiting Side effects that usually do not require medical attention (report to your doctor or health care professional if they continue or are bothersome): -cough -difficulty sleeping -headache -nervousness or trembling -stomach upset -stuffy or runny nose -throat irritation -unusual taste This list may not describe all possible side effects. Call your doctor for medical advice about side effects. You may report side effects to FDA at 1-800-FDA-1088. Where should I keep my medicine? Keep out of the reach of children. Store at room temperature between 15 and 30 degrees C (59 and 86 degrees F). The contents are under pressure and may burst when exposed to heat or flame. Do not freeze. This medicine does not work as  well if it is too cold. Throw away any unused medicine after the expiration date. Inhalers need to be thrown away after the labeled number of puffs have been used or by the expiration date; whichever comes first. Ventolin HFA should be thrown away 12 months after removing from foil pouch. Check the instructions that come with your medicine. °NOTE: This sheet is a summary. It may  not cover all possible information. If you have questions about this medicine, talk to your doctor, pharmacist, or health care provider. °  °© 2016, Elsevier/Gold Standard. (2013-04-08 10:57:17) ° °Prednisone tablets °What is this medicine? °PREDNISONE (PRED ni sone) is a corticosteroid. It is commonly used to treat inflammation of the skin, joints, lungs, and other organs. Common conditions treated include asthma, allergies, and arthritis. It is also used for other conditions, such as blood disorders and diseases of the adrenal glands. °This medicine may be used for other purposes; ask your health care provider or pharmacist if you have questions. °What should I tell my health care provider before I take this medicine? °They need to know if you have any of these conditions: °-Cushing's syndrome °-diabetes °-glaucoma °-heart disease °-high blood pressure °-infection (especially a virus infection such as chickenpox, cold sores, or herpes) °-kidney disease °-liver disease °-mental illness °-myasthenia gravis °-osteoporosis °-seizures °-stomach or intestine problems °-thyroid disease °-an unusual or allergic reaction to lactose, prednisone, other medicines, foods, dyes, or preservatives °-pregnant or trying to get pregnant °-breast-feeding °How should I use this medicine? °Take this medicine by mouth with a glass of water. Follow the directions on the prescription label. Take this medicine with food. If you are taking this medicine once a day, take it in the morning. Do not take more medicine than you are told to take. Do not suddenly stop taking your medicine because you may develop a severe reaction. Your doctor will tell you how much medicine to take. If your doctor wants you to stop the medicine, the dose may be slowly lowered over time to avoid any side effects. °Talk to your pediatrician regarding the use of this medicine in children. Special care may be needed. °Overdosage: If you think you have taken too much  of this medicine contact a poison control center or emergency room at once. °NOTE: This medicine is only for you. Do not share this medicine with others. °What if I miss a dose? °If you miss a dose, take it as soon as you can. If it is almost time for your next dose, talk to your doctor or health care professional. You may need to miss a dose or take an extra dose. Do not take double or extra doses without advice. °What may interact with this medicine? °Do not take this medicine with any of the following medications: °-metyrapone °-mifepristone °This medicine may also interact with the following medications: °-aminoglutethimide °-amphotericin B °-aspirin and aspirin-like medicines °-barbiturates °-certain medicines for diabetes, like glipizide or glyburide °-cholestyramine °-cholinesterase inhibitors °-cyclosporine °-digoxin °-diuretics °-ephedrine °-female hormones, like estrogens and birth control pills °-isoniazid °-ketoconazole °-NSAIDS, medicines for pain and inflammation, like ibuprofen or naproxen °-phenytoin °-rifampin °-toxoids °-vaccines °-warfarin °This list may not describe all possible interactions. Give your health care provider a list of all the medicines, herbs, non-prescription drugs, or dietary supplements you use. Also tell them if you smoke, drink alcohol, or use illegal drugs. Some items may interact with your medicine. °What should I watch for while using this medicine? °Visit your doctor or health care professional for   regular checks on your progress. If you are taking this medicine over a prolonged period, carry an identification card with your name and address, the type and dose of your medicine, and your doctor's name and address. This medicine may increase your risk of getting an infection. Tell your doctor or health care professional if you are around anyone with measles or chickenpox, or if you develop sores or blisters that do not heal properly. If you are going to have surgery, tell  your doctor or health care professional that you have taken this medicine within the last twelve months. Ask your doctor or health care professional about your diet. You may need to lower the amount of salt you eat. This medicine may affect blood sugar levels. If you have diabetes, check with your doctor or health care professional before you change your diet or the dose of your diabetic medicine. What side effects may I notice from receiving this medicine? Side effects that you should report to your doctor or health care professional as soon as possible: -allergic reactions like skin rash, itching or hives, swelling of the face, lips, or tongue -changes in emotions or moods -changes in vision -depressed mood -eye pain -fever or chills, cough, sore throat, pain or difficulty passing urine -increased thirst -swelling of ankles, feet Side effects that usually do not require medical attention (report to your doctor or health care professional if they continue or are bothersome): -confusion, excitement, restlessness -headache -nausea, vomiting -skin problems, acne, thin and shiny skin -trouble sleeping -weight gain This list may not describe all possible side effects. Call your doctor for medical advice about side effects. You may report side effects to FDA at 1-800-FDA-1088. Where should I keep my medicine? Keep out of the reach of children. Store at room temperature between 15 and 30 degrees C (59 and 86 degrees F). Protect from light. Keep container tightly closed. Throw away any unused medicine after the expiration date. NOTE: This sheet is a summary. It may not cover all possible information. If you have questions about this medicine, talk to your doctor, pharmacist, or health care provider.    2016, Elsevier/Gold Standard. (2011-06-06 10:57:14)

## 2016-04-03 ENCOUNTER — Telehealth: Payer: Self-pay | Admitting: Psychology

## 2016-04-03 ENCOUNTER — Ambulatory Visit (INDEPENDENT_AMBULATORY_CARE_PROVIDER_SITE_OTHER): Payer: Self-pay | Admitting: Internal Medicine

## 2016-04-03 ENCOUNTER — Encounter: Payer: Self-pay | Admitting: Internal Medicine

## 2016-04-03 VITALS — BP 163/90 | HR 82 | Temp 98.1°F | Ht 64.0 in | Wt 295.0 lb

## 2016-04-03 DIAGNOSIS — R05 Cough: Secondary | ICD-10-CM

## 2016-04-03 DIAGNOSIS — R062 Wheezing: Secondary | ICD-10-CM

## 2016-04-03 DIAGNOSIS — R059 Cough, unspecified: Secondary | ICD-10-CM

## 2016-04-03 DIAGNOSIS — J209 Acute bronchitis, unspecified: Secondary | ICD-10-CM

## 2016-04-03 MED ORDER — IPRATROPIUM BROMIDE HFA 17 MCG/ACT IN AERS: 2.0000 | INHALATION_SPRAY | RESPIRATORY_TRACT | Status: DC | PRN

## 2016-04-03 MED ORDER — HYDROCODONE-HOMATROPINE 5-1.5 MG/5ML PO SYRP: 5.0000 mL | ORAL_SOLUTION | Freq: Four times a day (QID) | ORAL | Status: DC | PRN

## 2016-04-03 NOTE — Assessment & Plan Note (Addendum)
-   Counseled patient that cough would likely continue for several weeks. - Complete 5-day course of prednisone.  - Prescribed hycodan to help control cough - Prescribed ipratropium inhaler for patient to try for wheezing in addition to albuterol inhaler she already has

## 2016-04-03 NOTE — Telephone Encounter (Signed)
Tiane called to say that she is almost out of her [place].  Last seen in March.  Appointments missed in between.  Scheduled for 6/14.  Agreed to call in a month's supply but will need to see her in June.    She thinks the patient assistance paperwork went through fine and she should be able to pick up the medicine ([facility] on [address]).  I told her I will be out of town next week so if there is any difficulty getting it, she needs to call me before the end of this week.  She voiced an understanding.

## 2016-04-03 NOTE — Telephone Encounter (Signed)
Dr. Tammi Klippel not available on the 14th.  We rescheduled for June 28th at 9:30.  She needs a non-filing form printed out.

## 2016-04-03 NOTE — Addendum Note (Signed)
Addended by: Zella Ball E on: 04/03/2016 10:59 AM   Modules accepted: Medications

## 2016-04-03 NOTE — Patient Instructions (Signed)
Carla Hanson,  I'm sorry you aren't feeling well.   You likely have a viral bronchitis. I have prescribed a cough syrup that may help. I have also prescribed another inhaler called ipratropium that you may find more helpful than the albuterol inhaler.  I expect you will be feeling somewhat better in a few days with the steroids. However, your cough will likely linger for several weeks.  Once you are feeling better, I would recommend getting breathing testing done with Dr. Valentina Lucks at our clinic.  Best, Dr. Ola Spurr

## 2016-04-03 NOTE — Progress Notes (Signed)
   Subjective:    Patient ID: Carla Hanson, female    DOB: [birth year], 50 y.o.   MRN: [id number]  HPI  Carla Hanson is a 49-y/o female with history of HTN, obesity, HLD, past DVTs, tobacco abuse and bipolar disorder who presents with continued shortness of breath, cough, and wheezing after being seen in the Emergency Department 3 days ago.  Shortness of breath: - Symptoms began about 1 week ago. Started with sore throat, followed by cough and congestion. Cough has continued with some production of sputum. Now has wheezing and sensation of chest tightness with shortness of breath.  - Denies fevers. Denies worsening of SOB with lying flat. Denies previous episodes of wheezing.  - Assessed in ED 5/28 and thought to have acute bronchitis with bronchospasm. CXR negative for pneumonia or pleural effusion at that time. Sent home with an albuterol inhaler that she has been using every 3.5-4 hours, which she thinks "helps a little." She was also prescribed 40 mg steroids x 5 days, which she first started taking yesterday.  - She continues to smoke less than 1/2 a PPD and is trying to cut back.   Review of Systems  Constitutional: Negative for fever and chills.  HENT: Positive for congestion.   Respiratory: Positive for cough, chest tightness and shortness of breath.   Gastrointestinal: Negative for nausea, vomiting and diarrhea.  Musculoskeletal: Negative for myalgias.   Social: Current Smoker    Objective: Blood pressure 163/90, pulse 82, temperature 98.1 F (36.7 C), temperature source Oral, height 5\' 4"  (1.626 m), weight 295 lb (133.811 kg), last menstrual period 03/10/2016. SpO2 96% resting, 93% ambulating.    Physical Exam  Constitutional: No distress.  Obese, tired-appearing female.  Cardiovascular: Normal rate, regular rhythm and normal heart sounds.   No murmur heard. Pulmonary/Chest: Effort normal. No respiratory distress. She has wheezes (Diffuse expiratory wheezes.).    Musculoskeletal: Normal range of motion.  Skin: Skin is warm and dry. No rash noted. No erythema.  Psychiatric:  Somewhat flattened affect.      Assessment & Plan:  Patient presents with cough and wheezing x 1 week that is improved somewhat with albuterol inhaler. Given lack of fever, no focal findings on lung exam, and CXR negative for pneumonia at ED visit, suspect she has a viral bronchiolitis and do not think antibiotics are indicated at this time. Patient has never been diagnosed with COPD or treated for a presumed exacerbation, but lungs did appear hyperinflated on CXR 5/28. Only has 7.5 pack year history, however.    Return in about 1 month once cough has resolved for PFTs to assess for COPD. Encouraged to return to discuss smoking cessation when ready.   Acute bronchitis - Counseled patient that cough would likely continue for several weeks. - Complete 5-day course of prednisone.  - Prescribed hycodan to help control cough - Prescribed ipratropium inhaler for patient to try for wheezing in addition to albuterol inhaler she already has   Olene Floss, MD [facility], PGY-1

## 2016-04-04 ENCOUNTER — Encounter: Payer: Self-pay | Admitting: Psychology

## 2016-04-17 ENCOUNTER — Emergency Department (HOSPITAL_COMMUNITY)
Admission: EM | Admit: 2016-04-17 | Discharge: 2016-04-17 | Disposition: A | Discharge status: Home / Self Care | Payer: Self-pay | Attending: Emergency Medicine | Admitting: Emergency Medicine

## 2016-04-17 ENCOUNTER — Emergency Department (HOSPITAL_BASED_OUTPATIENT_CLINIC_OR_DEPARTMENT_OTHER)
Admission: EM | Admit: 2016-04-17 | Discharge: 2016-04-17 | Disposition: A | Discharge status: Home / Self Care | Payer: Self-pay | Source: Home / Self Care

## 2016-04-17 ENCOUNTER — Encounter (HOSPITAL_COMMUNITY): Payer: Self-pay | Admitting: Emergency Medicine

## 2016-04-17 DIAGNOSIS — M79609 Pain in unspecified limb: Secondary | ICD-10-CM

## 2016-04-17 DIAGNOSIS — F319 Bipolar disorder, unspecified: Secondary | ICD-10-CM | POA: Insufficient documentation

## 2016-04-17 DIAGNOSIS — Z79899 Other long term (current) drug therapy: Secondary | ICD-10-CM | POA: Insufficient documentation

## 2016-04-17 DIAGNOSIS — M79605 Pain in left leg: Secondary | ICD-10-CM

## 2016-04-17 DIAGNOSIS — F17210 Nicotine dependence, cigarettes, uncomplicated: Secondary | ICD-10-CM | POA: Insufficient documentation

## 2016-04-17 DIAGNOSIS — I10 Essential (primary) hypertension: Secondary | ICD-10-CM | POA: Insufficient documentation

## 2016-04-17 DIAGNOSIS — E785 Hyperlipidemia, unspecified: Secondary | ICD-10-CM | POA: Insufficient documentation

## 2016-04-17 DIAGNOSIS — I824Z2 Acute embolism and thrombosis of unspecified deep veins of left distal lower extremity: Secondary | ICD-10-CM | POA: Insufficient documentation

## 2016-04-17 LAB — I-STAT CHEM 8, ED
BUN: 4 mg/dL — ABNORMAL LOW (ref 6–20)
CREATININE: 0.7 mg/dL (ref 0.44–1.00)
Calcium, Ion: 1.16 mmol/L (ref 1.12–1.23)
Chloride: 102 mmol/L (ref 101–111)
Glucose, Bld: 78 mg/dL (ref 65–99)
HEMATOCRIT: 44 % (ref 36.0–46.0)
HEMOGLOBIN: 15 g/dL (ref 12.0–15.0)
POTASSIUM: 3.5 mmol/L (ref 3.5–5.1)
Sodium: 139 mmol/L (ref 135–145)
TCO2: 25 mmol/L (ref 0–100)

## 2016-04-17 LAB — PROTIME-INR
INR: 1.04 (ref 0.00–1.49)
Prothrombin Time: 13.4 seconds (ref 11.6–15.2)

## 2016-04-17 MED ORDER — KETOROLAC TROMETHAMINE 30 MG/ML IJ SOLN
30.0000 mg | Freq: Once | INTRAMUSCULAR | Status: DC
  Filled 2016-04-17: qty 1

## 2016-04-17 MED ORDER — RIVAROXABAN (XARELTO) EDUCATION KIT FOR DVT/PE PATIENTS
PACK | Freq: Once | Status: AC
  Administered 2016-04-17: 15:00:00
  Filled 2016-04-17: qty 1

## 2016-04-17 MED ORDER — HYDROCODONE-ACETAMINOPHEN 5-325 MG PO TABS: 1.0000 | ORAL_TABLET | Freq: Four times a day (QID) | ORAL | Status: DC | PRN

## 2016-04-17 MED ORDER — HYDROCODONE-ACETAMINOPHEN 5-325 MG PO TABS
1.0000 | ORAL_TABLET | Freq: Once | ORAL | Status: AC
  Administered 2016-04-17: 1 via ORAL
  Filled 2016-04-17: qty 1

## 2016-04-17 MED ORDER — RIVAROXABAN (XARELTO) VTE STARTER PACK (15 & 20 MG): ORAL_TABLET | ORAL | Status: DC

## 2016-04-17 MED ORDER — RIVAROXABAN 15 MG PO TABS
15.0000 mg | ORAL_TABLET | Freq: Once | ORAL | Status: AC
  Administered 2016-04-17: 15 mg via ORAL
  Filled 2016-04-17: qty 1

## 2016-04-17 NOTE — ED Notes (Signed)
Pt c/o leg pain that began last Thursday with no injury.  Hx of DVT.

## 2016-04-17 NOTE — ED Provider Notes (Signed)
CSN: [id number]     Arrival date & time 04/17/16  1017 History   First MD Initiated Contact with Patient 04/17/16 1228     Chief Complaint  Patient presents with  . Leg Pain     (Consider location/radiation/quality/duration/timing/severity/associated sxs/prior Treatment) HPI Comments: Carla Hanson is a 50 y.o. 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 Patient states that she was dancing around her house last week, and several hours after this activity she noticed some pain in the posterior knee that then gradually became left lateral lower leg pain. She describes the pain currently as 3/10 aching intermittent nonradiating pain in the left lateral lower leg, worse with the first few steps in the morning, and improved with Tylenol. No other known aggravating or alleviating factors. She denies any injuries or trauma. This is the same like she has had a prior DVT in, she is no longer on anticoagulation since she was taken off of it after her labs for thrombophilia were negative and it was determined that she didn't need any further anticoagulation.   She denies any leg swelling, erythema, warmth, loss of range of motion, fevers, chills, chest pain, shortness breath, abdominal pain, nausea, vomiting, diarrhea, constipation, dysuria, hematuria, numbness, tingling, or focal weakness.  Patient is a 50 y.o. female presenting with leg pain. The history is provided by the patient and medical records. No language interpreter was used.  Leg Pain Location:  Leg Time since incident:  6 days Injury: no   Leg location:  L lower leg Pain details:    Quality:  Aching   Radiates to:  Does not radiate   Severity:  Mild   Onset quality:  Gradual   Duration:  6 days   Timing:  Intermittent  Progression:  Unchanged Chronicity:  Recurrent Prior injury to area:  No Relieved by:  Acetaminophen Exacerbated by: first few steps in the morning. Ineffective treatments:  None tried Associated symptoms: no decreased ROM, no fever, no muscle weakness, no numbness, no swelling and no tingling     Past Medical History  Diagnosis Date  . Hypertension   . Lipoma     Abdomen  . Obesity   . Hyperlipidemia   . Tobacco abuse   . LIPOMA 01/20/2008  . Anxiety   . Bipolar disorder [facility])    Past Surgical History  Procedure Laterality Date  . Lipoma removed  03/2011  . Cesarean section    . Orif ankle fracture  03/13/2012    Procedure: OPEN REDUCTION INTERNAL FIXATION (ORIF) ANKLE FRACTURE;  Surgeon: Kerrin Champagne, MD;  Location: [facility] ORS;  Service: Orthopedics;  Laterality: Right;   Family History  Problem Relation Age of Onset  . Asthma Father    Social History  Substance Use Topics  . Smoking status: Light Tobacco Smoker -- 0.30 packs/day for 25 years    Types: Cigarettes    Last Attempt to Quit: 09/05/2013  . Smokeless tobacco: Never Used     Comment: in process of quitting on Chantix  . Alcohol Use: 0.0 oz/week    0 Standard drinks or equivalent per week     Comment: 2 beer / week   OB History    No data available     Review of Systems  Constitutional: Negative for  fever and chills.  Respiratory: Negative for shortness of breath.   Cardiovascular: Negative for chest pain and leg swelling.  Gastrointestinal: Negative for nausea, vomiting, abdominal pain, diarrhea and constipation.  Genitourinary: Negative for dysuria and hematuria.  Musculoskeletal: Positive for myalgias (lateral LLE). Negative for arthralgias.  Skin: Negative for color change.  Allergic/Immunologic: Negative for immunocompromised state.  Neurological: Negative for weakness and numbness.  Hematological: Does not bruise/bleed easily.  Psychiatric/Behavioral: Negative for confusion.   10 Systems reviewed  and are negative for acute change except as noted in the HPI.    Allergies  Review of patient's allergies indicates no known allergies.  Home Medications   Prior to Admission medications   Medication Sig Start Date End Date Taking? Authorizing Provider  HYDROcodone-homatropine (HYCODAN) 5-1.5 MG/5ML syrup Take 5 mLs by mouth every 6 (six) hours as needed for cough. 04/03/16   Hillary Corinda Gubler, MD  ipratropium (ATROVENT HFA) 17 MCG/ACT inhaler Inhale 2 puffs into the lungs every 4 (four) hours as needed for wheezing. 04/03/16   Rogue Bussing, MD  LATUDA 60 MG TABS Take 60 mg by mouth daily. Per Dr. Tammi Klippel in [facility].  #30 with no refills called in on 04/03/16.  Follow-up scheduled 04/17/16. 12/20/15 04/18/16  Historical Provider, MD  lisinopril-hydrochlorothiazide (PRINZIDE,ZESTORETIC) 20-25 MG tablet TAKE 1 TABLET BY MOUTH DAILY 01/12/16   Rogue Bussing, MD  Lurasidone HCl (LATUDA) 60 MG TABS Take 60 mg by mouth daily.  11/24/13 04/03/15  Historical Provider, MD  predniSONE (DELTASONE) 20 MG tablet Take 2 tablets (40 mg total) by mouth daily. 2/83/15   Delora Fuel, MD  traZODone (DESYREL) 100 MG tablet Take 100 mg by mouth at bedtime. #30.  Take one at bedtime.  5 refills.  She can take a half a tablet if that works for her.  Per Dr. Tammi Klippel in [facility]. 12/20/15 05/18/16  Historical Provider, MD  traZODone (DESYREL) 50 MG tablet Take 50 mg by mouth at bedtime. Take one to two pills at night to help with sleep.  #60 with 5 refills per Dr. Tammi Klippel. 05/25/15 10/26/15  Historical Provider, MD   BP 133/71 mmHg  Pulse 73  Temp(Src) 99.4 F (37.4 C) (Oral)  Resp 16  SpO2 97%  LMP 04/14/2016 Physical Exam  Constitutional: She is oriented to person, place, and time. Vital signs are normal. She appears well-developed and well-nourished.  Non-toxic appearance. No distress.  Afebrile, nontoxic, NAD  HENT:  Head: Normocephalic and atraumatic.  Mouth/Throat: Oropharynx is clear and moist and  mucous membranes are normal.  Eyes: Conjunctivae and EOM are normal. Right eye exhibits no discharge. Left eye exhibits no discharge.  Neck: Normal range of motion. Neck supple.  Cardiovascular: Normal rate, regular rhythm, normal heart sounds and intact distal pulses.  Exam reveals no gallop and no friction rub.   No murmur heard. Pulmonary/Chest: Effort normal and breath sounds normal. No respiratory distress. She has no decreased breath sounds. She has no wheezes. She has no rhonchi. She has no rales.  Abdominal: Soft. Normal appearance and bowel sounds are normal. She exhibits no distension. There is no tenderness. There is no rigidity, no rebound, no guarding, no CVA tenderness, no tenderness at McBurney's point and negative Murphy's sign.  Musculoskeletal: Normal range of motion.       Left lower leg: She exhibits tenderness. She exhibits no bony tenderness, no swelling and no edema.       Legs: L lower leg with mild TTP along the lateral aspect  in the peroneus longus/brevis muscles, which have slight spasms noted, with FROM intact in all joints of the LE, no swelling or edema, no skin changes, no focal bony TTP, with strength and sensation grossly intact, distal pulses intact, and soft compartments. Neg homan's bilaterally.  Neurological: She is alert and oriented to person, place, and time. She has normal strength. No sensory deficit.  Skin: Skin is warm, dry and intact. No rash noted.  Psychiatric: She has a normal mood and affect.  Nursing note and vitals reviewed.   ED Course  Procedures (including critical care time) Labs Review Labs Reviewed  I-STAT CHEM 8, ED - Abnormal; Notable for the following:    BUN 4 (*)    All other components within normal limits  PROTIME-INR    Imaging Review No results found.   Progress Notes by Chapman Fitch at 04/17/2016 1:47 PM    Author: Chapman Fitch Service: Vascular Lab Author Type: Cardiovascular Sonographer   Filed: 04/17/2016 1:48  PM Note Time: 04/17/2016 1:47 PM Status: Signed   Editor: Chapman Fitch (Cardiovascular Sonographer)     Expand All Collapse All   *PRELIMINARY RESULTS* Vascular Ultrasound Left lower extremity venous duplex has been completed. Preliminary findings: Acute DVT noted in the left popliteal vein, gastroc veins, and peroneal veins. 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   Gave results to Oberlin, McCormick, RDMS, RVT 04/17/2016, 1:47 PM       LLE DVT STUDY 09/21/13 Summary: - Findings consistent with acute versus subacute deep vein thrombosis involving the left popliteal vein and left posterial tibial vein. - No evidence of Baker's cyst on the left. Other specific details can be found in the table(s) above.  Prepared and Electronically Authenticated by Gae Gallop [id number]  I have personally reviewed and evaluated these images and lab results as part of my medical decision-making.   EKG Interpretation None      MDM   Final diagnoses:  Acute deep vein thrombosis (DVT) of distal vein of left lower extremity (HCC)  Left leg pain    50 y.o. female here with LLE pain over the lateral aspect of the LLE, same leg she had a prior DVT in. The peroneus longus and brevis are tender to palpation and have some spasms noted, extremities NVI with soft compartments. Likely just a muscle spasm/strain, but given that she's had 2 prior DVTs, want to obtain U/S to ensure no repeat DVT. States she had thrombophilia panel in the past which was negative so she is no longer on anticoagulation. Will give Toradol IM for pain, and reassess after U/S.   1:47 PM U/S tech in to advise me that U/S has acute DVT in the lower leg, no upper leg DVTs. Last baseline labs were 11/2014, will get chem 8 and INR for baseline (prior kidney function always good), but will start xarelto again. Pt hadn't gotten toradol, and now prefers a pill rather than a shot-- will  give norco instead. Will await chem8 then d/c home with xarelto.  3:20 PM INR WNL, chem 8 WNL, kidney function adequate. Case manager will f/up with pt tomorrow via phone, pharmacy gave pt rx cards to help with copay. Will d/c home with rx for starter pack of xarelto and norco rx for pain. Discussed f/up with PCP in 1 week for ongoing management of her DVT, and potentially to transition her to coumadin instead which is more affordable. I explained the diagnosis and have  given explicit precautions to return to the ER including for any other new or worsening symptoms. The patient understands and accepts the medical plan as it's been dictated and I have answered their questions. Discharge instructions concerning home care and prescriptions have been given. The patient is STABLE and is discharged to home in good condition.  BP 131/86 mmHg  Pulse 59  Temp(Src) 99.4 F (37.4 C) (Oral)  Resp 16  SpO2 100%  LMP 04/14/2016  Meds ordered this encounter  Medications  . rivaroxaban [employer]) Education Kit for DVT/PE patients    Sig:   . Rivaroxaban (XARELTO) tablet 15 mg    Sig:   . HYDROcodone-acetaminophen (NORCO/VICODIN) 5-325 MG per tablet 1 tablet    Sig:   . Rivaroxaban 15 & 20 MG TBPK    Sig: Take as directed on package: Start with one '15mg'$  tablet by mouth twice a day with food. On Day 22, switch to one '20mg'$  tablet once a day with food.    Dispense:  58 each    Refill:  0    Order Specific Question:  Supervising Provider    Answer:  MILLER, BRIAN [[id number]]  . HYDROcodone-acetaminophen (NORCO) 5-325 MG tablet    Sig: Take 1 tablet by mouth every 6 (six) hours as needed for severe pain.    Dispense:  6 tablet    Refill:  0    Order Specific Question:  Supervising Provider    Answer:  Noemi Chapel [[id number]]       Carla Zanetti Camprubi-Soms, PA-C 04/17/16 1527  Leo Grosser, MD 04/17/16 2003

## 2016-04-17 NOTE — Discharge Instructions (Addendum)
Your leg pain is due to a blood clot in your leg. Take xarelto as directed for this condition. You were given your first dose here today, so continue with the rest of the starter pack as directed on the packaging. Use norco as directed as needed for additional pain relief but don't drive while taking this medication. Elevate your leg to help with pain and swelling. Follow up with your regular doctor in 1 week for ongoing management of your leg pain/blood clot, and for recheck of symptoms. Return to the ER for changes or worsening symptoms.   Information on my medicine - XARELTO (rivaroxaban)  This medication education was reviewed with me or my healthcare representative as part of my discharge preparation.  The pharmacist that spoke with me during my hospital stay was:  Rudean Haskell, [place]? Xarelto was prescribed to treat blood clots that may have been found in the veins of your legs (deep vein thrombosis) or in your lungs (pulmonary embolism) and to reduce the risk of them occurring again.  What do you need to know about Xarelto? The starting dose is one 15 mg tablet taken TWICE daily with food for the FIRST 21 DAYS then on July 5th, the dose is changed to one 20 mg tablet taken ONCE A DAY with your evening meal.  DO NOT stop taking Xarelto without talking to the health care provider who prescribed the medication.  Refill your prescription for 20 mg tablets before you run out.  After discharge, you should have regular check-up appointments with your healthcare provider that is prescribing your Xarelto.  In the future your dose may need to be changed if your kidney function changes by a significant amount.  What do you do if you miss a dose? If you are taking Xarelto TWICE DAILY and you miss a dose, take it as soon as you remember. You may take two 15 mg tablets (total 30 mg) at the same time then resume your regularly scheduled 15 mg twice daily the  next day.  If you are taking Xarelto ONCE DAILY and you miss a dose, take it as soon as you remember on the same day then continue your regularly scheduled once daily regimen the next day. Do not take two doses of Xarelto at the same time.   Important Safety Information Xarelto is a blood thinner medicine that can cause bleeding. You should call your healthcare provider right away if you experience any of the following: ? Bleeding from an injury or your nose that does not stop. ? Unusual colored urine (red or dark brown) or unusual colored stools (red or black). ? Unusual bruising for unknown reasons. ? A serious fall or if you hit your head (even if there is no bleeding).  Some medicines may interact with Xarelto and might increase your risk of bleeding while on Xarelto. To help avoid this, consult your healthcare provider or pharmacist prior to using any new prescription or non-prescription medications, including herbals, vitamins, non-steroidal anti-inflammatory drugs (NSAIDs) and supplements.  This website has more information on Xarelto: [email or web address]  Deep Vein Thrombosis A deep vein thrombosis (DVT) is a blood clot (thrombus) that usually occurs in a deep, larger vein of the lower leg or the pelvis, or in an upper extremity such as the arm. These are dangerous and can lead to serious and even life-threatening complications if the clot travels to the lungs. A DVT can damage the valves in your  leg veins so that instead of flowing upward, the blood pools in the lower leg. This is called post-thrombotic syndrome, and it can result in pain, swelling, discoloration, and sores on the leg. CAUSES A DVT is caused by the formation of a blood clot in your leg, pelvis, or arm. Usually, several things contribute to the formation of blood clots. A clot may develop when:  Your blood flow slows down.  Your vein becomes damaged in some way.  You have a condition that makes your blood clot  more easily. RISK FACTORS A DVT is more likely to develop in:  People who are older, especially over 55 years of age.  People who are overweight (obese).  People who sit or lie still for a long time, such as during long-distance travel (over 4 hours), bed rest, hospitalization, or during recovery from certain medical conditions like a stroke.  People who do not engage in much physical activity (sedentary lifestyle).  People who have chronic breathing disorders.  People who have a personal or family history of blood clots or blood clotting disease.  People who have peripheral vascular disease (PVD), diabetes, or some types of cancer.  People who have heart disease, especially if the person had a recent heart attack or has congestive heart failure.  People who have neurological diseases that affect the legs (leg paresis).  People who have had a traumatic injury, such as breaking a hip or leg.  People who have recently had major or lengthy surgery, especially on the hip, knee, or abdomen.  People who have had a central line placed inside a large vein.  People who take medicines that contain the hormone estrogen. These include birth control pills and hormone replacement therapy.  Pregnancy or during childbirth or the postpartum period.  Long plane flights (over 8 hours). SIGNS AND SYMPTOMS Symptoms of a DVT can include:   Swelling of your leg or arm, especially if one side is much worse.  Warmth and redness of your leg or arm, especially if one side is much worse.  Pain in your arm or leg. If the clot is in your leg, symptoms may be more noticeable or worse when you stand or walk.  A feeling of pins and needles, if the clot is in the arm. The symptoms of a DVT that has traveled to the lungs (pulmonary embolism, PE) usually start suddenly and include:  Shortness of breath while active or at rest.  Coughing or coughing up blood or blood-tinged mucus.  Chest pain that is  often worse with deep breaths.  Rapid or irregular heartbeat.  Feeling light-headed or dizzy.  Fainting.  Feeling anxious.  Sweating. There may also be pain and swelling in a leg if that is where the blood clot started. These symptoms may represent a serious problem that is an emergency. Do not wait to see if the symptoms will go away. Get medical help right away. Call your local emergency services (911 in the [place] Do not drive yourself to the hospital. DIAGNOSIS Your health care provider will take a medical history and perform a physical exam. You may also have other tests, including:  Blood tests to assess the clotting properties of your blood.  Imaging tests, such as CT, ultrasound, MRI, X-ray, and other tests to see if you have clots anywhere in your body. TREATMENT After a DVT is identified, it can be treated. The type of treatment that you receive depends on many factors, such as the cause of  your DVT, your risk for bleeding or developing more clots, and other medical conditions that you have. Sometimes, a combination of treatments is necessary. Treatment options may be combined and include:  Monitoring the blood clot with ultrasound.  Taking medicines by mouth, such as newer blood thinners (anticoagulants), thrombolytics, or warfarin.  Taking anticoagulant medicine by injection or through an IV tube.  Wearing compression stockings or using different types ofdevices.  Surgery (rare) to remove the blood clot or to place a filter in your abdomen to stop the blood clot from traveling to your lungs. Treatments for a DVT are often divided into immediate treatment and long-term treatment (up to 3 months after DVT). You can work with your health care provider to choose the treatment program that is best for you. HOME CARE INSTRUCTIONS If you are taking a newer oral anticoagulant:  Take the medicine every single day at the same time each day.  Understand what foods and drugs  interact with this medicine.  Understand that there are no regular blood tests required when using this medicine.  Understand the side effects of this medicine, including excessive bruising or bleeding. Ask your health care provider or pharmacist about other possible side effects. If you are taking warfarin:  Understand how to take warfarin and know which foods can affect how warfarin works in Veterinary surgeon.  Understand that it is dangerous to take too much or too little warfarin. Too much warfarin increases the risk of bleeding. Too little warfarin continues to allow the risk for blood clots.  Follow your PT and INR blood testing schedule. The PT and INR results allow your health care provider to adjust your dose of warfarin. It is very important that you have your PT and INR tested as often as told by your health care provider.  Avoid major changes in your diet, or tell your health care provider before you change your diet. Arrange a visit with a registered dietitian to answer your questions. Many foods, especially foods that are high in vitamin K, can interfere with warfarin and affect the PT and INR results. Eat a consistent amount of foods that are high in vitamin K, such as:  Spinach, kale, broccoli, cabbage, collard greens, turnip greens, Brussels sprouts, peas, cauliflower, seaweed, and parsley.  Beef liver and pork liver.  Green tea.  Soybean oil.  Tell your health care provider about any and all medicines, vitamins, and supplements that you take, including aspirin and other over-the-counter anti-inflammatory medicines. Be especially cautious with aspirin and anti-inflammatory medicines. Do not take those before you ask your health care provider if it is safe to do so. This is important because many medicines can interfere with warfarin and affect the PT and INR results.  Do not start or stop taking any over-the-counter or prescription medicine unless your health care provider or  pharmacist tells you to do so. If you take warfarin, you will also need to do these things:  Hold pressure over cuts for longer than usual.  Tell your dentist and other health care providers that you are taking warfarin before you have any procedures in which bleeding may occur.  Avoid alcohol or drink very small amounts. Tell your health care provider if you change your alcohol intake.  Do not use tobacco products, including cigarettes, chewing tobacco, and e-cigarettes. If you need help quitting, ask your health care provider.  Avoid contact sports. General Instructions  Take over-the-counter and prescription medicines only as told by your health care  provider. Anticoagulant medicines can have side effects, including easy bruising and difficulty stopping bleeding. If you are prescribed an anticoagulant, you will also need to do these things:  Hold pressure over cuts for longer than usual.  Tell your dentist and other health care providers that you are taking anticoagulants before you have any procedures in which bleeding may occur.  Avoid contact sports.  Wear a medical alert bracelet or carry a medical alert card that says you have had a PE.  Ask your health care provider how soon you can go back to your normal activities. Stay active to prevent new blood clots from forming.  Make sure to exercise while traveling or when you have been sitting or standing for a long period of time. It is very important to exercise. Exercise your legs by walking or by tightening and relaxing your leg muscles often. Take frequent walks.  Wear compression stockings as told by your health care provider to help prevent more blood clots from forming.  Do not use tobacco products, including cigarettes, chewing tobacco, and e-cigarettes. If you need help quitting, ask your health care provider.  Keep all follow-up appointments with your health care provider. This is important. PREVENTION Take these  actions to decrease your risk of developing another DVT:  Exercise regularly. For at least 30 minutes every day, engage in:  Activity that involves moving your arms and legs.  Activity that encourages good blood flow through your body by increasing your heart rate.  Exercise your arms and legs every hour during long-distance travel (over 4 hours). Drink plenty of water and avoid drinking alcohol while traveling.  Avoid sitting or lying in bed for long periods of time without moving your legs.  Maintain a weight that is appropriate for your height. Ask your health care provider what weight is healthy for you.  If you are a woman who is over 74 years of age, avoid unnecessary use of medicines that contain estrogen. These include birth control pills.  Do not smoke, especially if you take estrogen medicines. If you need help quitting, ask your health care provider. If you are hospitalized, prevention measures may include:  Early walking after surgery, as soon as your health care provider says that it is safe.  Receiving anticoagulants to prevent blood clots.If you cannot take anticoagulants, other options may be available, such as wearing compression stockings or using different types of devices. SEEK IMMEDIATE MEDICAL CARE IF:  You have new or increased pain, swelling, or redness in an arm or leg.  You have numbness or tingling in an arm or leg.  You have shortness of breath while active or at rest.  You have chest pain.  You have a rapid or irregular heartbeat.  You feel light-headed or dizzy.  You cough up blood.  You notice blood in your vomit, bowel movement, or urine. These symptoms may represent a serious problem that is an emergency. Do not wait to see if the symptoms will go away. Get medical help right away. Call your local emergency services (911 in the [place] Do not drive yourself to the hospital.   This information is not intended to replace advice given to you by  your health care provider. Make sure you discuss any questions you have with your health care provider.   Document Released: 10/21/2005 Document Revised: 07/12/2015 Document Reviewed: 02/15/2015 Elsevier Interactive Patient Education 2016 [place].  Venous Thromboembolism Prevention Venous thromboembolism (VTE) is a condition in which a blood clot (  thrombus) develops in the body. A thrombus usually occurs in a deep vein in the leg or the pelvis (DVT), but it can also occur in the arm. Sometimes, pieces of a thrombus can break off from its original place of development and travel through the bloodstream to other parts of the body. When that happens, the thrombus is called an embolus. An embolus that travels to one or both lungs is called a pulmonary embolism. An embolism can block the blood flow in the blood vessels of other organs as well. VTE is a serious health condition that can cause disability or death. It is very important to get help right away and to not ignore symptoms. HOW CAN A VTE BE PREVENTED?  Exercise regularly. Take a brisk 30 minute walk every day. Staying active and moving around can help you to prevent blood clots.  Avoid sitting or lying in bed for long periods of time without moving your legs. Change your position often, especially during long-distance travel (over 4 hours).  If you are a woman who is over 36 years of age, avoid unnecessary use of medicines that contain estrogen. These include birth control pills and hormone replacement therapy.  Do not smoke, especially if you take estrogen medicines. If you need help quitting, ask your health care provider.  Eat plenty of fruits and vegetables. Ask your health care provider or dietitian if there are foods that you should avoid.  Maintain a weight that is appropriate for your height. Ask your health care provider what weight is healthy for you.  Wear loose-fitting clothing. Avoid constrictive or tight clothing around  your legs or waist.  Try not to bump or injure your legs. Avoid crossing your legs when you are sitting.  Do not use pillows under your knees while lying down unless told by your health care provider.  Wear support hose (compression stockings or TED hose) as told by your health care provider Compression stockings increase blood flow in your legs and can help prevent blood clots. Do not let them bunch up when you are wearing them. HOW CAN I PREVENT VTE WHEN I TRAVEL? Long-distance travel (over 4 hours) can increase the risk of a VTE. To prevent VTE when traveling:  Exercise your legs every hour by standing, stretching, and bending and straightening your legs. If you are traveling by airplane, train, or bus, walk up and down the aisle as often as possible to get your blood moving. If you are traveling by car, stop and get out of the car every hour to exercise your legs and stretch. Other types of exercise might include:  Keeping your feet flat on the ground and raising your toes.  Switching from tightening the muscles in your calves and thighs to relaxing those same muscles while you are sitting.  Pointing and flexing your feet at the ankle joints while you are sitting.  Stay well hydrated while traveling. Drink enough water to keep your urine clear or pale yellow.  Avoid drinking alcohol during long travel. Generally, it is not recommended that you take medicines to prevent DVT during routine travel. HOW CAN VTE BE PREVENTED IF I AM HOSPITALIZED? A VTE may be prevented by taking medicines that are prescribed to prevent blood clots (anticoagulants). You can also help to prevent VTE while in the hospital by taking these actions:  Get out of bed and walk. Ask your health care provider if this is safe for you to do.  Request the use of a  sequential compression device (SCD). This is a machine that pumps air into compression sleeves that are wrapped around your legs.  Request the use of  compression stockings, which are tight, elastic stockings that apply pressure to the lower legs. Compression stockings are sometimes used with SCDs. HOW CAN I PREVENT VTE AFTER SURGERY? Understand that there is an increased risk for VTE for the first 4-6 weeks after surgery. During this time:  Avoid long-distance travel (over 4 hours). If you must travel during this time, ask your health care provider about additional preventive actions that you can take. These might include exercising your arms and legs every hour while you travel.  Avoid sitting or lying still for too long. If possible, get up and walk around one time every hour. Ask your health care provider when this is safe for you to do. SEEK IMMEDIATE MEDICAL CARE IF:  You have new or increased pain, swelling, or redness in an arm or leg.  You have numbness or tingling in an arm or leg.  You have shortness of breath while active or at rest.  You have chest pain.  You have a rapid or irregular heartbeat.  You feel light-headed or dizzy.  You cough up blood.  You notice blood in your vomit, bowel movement, or urine. These symptoms may represent a serious problem that is an emergency. Do not wait to see if the symptoms will go away. Get medical help right away. Call your local emergency services (911 in the [place] Do not drive yourself to the hospital.   This information is not intended to replace advice given to you by your health care provider. Make sure you discuss any questions you have with your health care provider.   Document Released: 10/09/2009 Document Revised: 07/12/2015 Document Reviewed: 02/15/2015 Elsevier Interactive Patient Education [employer].

## 2016-04-17 NOTE — Progress Notes (Addendum)
ED CM able to see pt prior to her leaving the ED  Pt has the xarelto starter kit and she is familiar with use of 30 day trial free card.  Pt knows that she is to take the card to her pharmacy ED CM and ED PA/NP met with pt to strongly encouraged pt to get an follow up appt with pcp (pt confirmed pcp is still being seen and she will call for an appt) As soon as possible so that the pcp can transition her to a cost effective medication for her. ED NP discussed coumadin cost and transition process - CM and ED PA discussed that with pt hx she should be on long term therapy for prevention of further blood clots Pt voiced understanding Pt apologized for her tone of frustration Pt stated she was in formed that he pharmacy needed to speak with the ED CM  1529 ED Cm called [facility] inpatient pharmacy to speak with colleen She was not present ED CM spoke with pharmacist mary ann

## 2016-04-17 NOTE — Progress Notes (Addendum)
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

## 2016-04-17 NOTE — Progress Notes (Signed)
*  PRELIMINARY RESULTS* Vascular Ultrasound Left lower extremity venous duplex has been completed.  Preliminary findings: Acute DVT noted in the left popliteal vein, gastroc veins, and peroneal veins. 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   Gave results to [place], [place], RDMS, RVT  04/17/2016, 1:47 PM

## 2016-04-18 ENCOUNTER — Encounter: Payer: Self-pay | Admitting: Licensed Clinical Social Worker

## 2016-04-18 ENCOUNTER — Ambulatory Visit (INDEPENDENT_AMBULATORY_CARE_PROVIDER_SITE_OTHER): Payer: Self-pay | Admitting: Family Medicine

## 2016-04-18 VITALS — BP 129/79 | HR 80 | Temp 98.3°F | Wt 298.0 lb

## 2016-04-18 DIAGNOSIS — I82432 Acute embolism and thrombosis of left popliteal vein: Secondary | ICD-10-CM

## 2016-04-18 NOTE — Progress Notes (Signed)
   Subjective:    Patient ID: Carla Hanson, female    DOB: 1965-12-07, 50 y.o.   MRN: [id number]  Seen for Same day visit for   CC: DVT  Recently diagnosed in ED with right lower extremity DVT.  She has been started on Xarelto and given a card for 30 day free trial.  She is concerned that she will not be healed afford this as she is currently without insurance.  Has history of previous DVT, treated with Xarelto.  She denies worsening leg pain or swelling.  Denies current chest pain, palpitations, shortness of breath.  She is interested in meeting with social worker today to discuss applying for Medicare/Medicaid and [facility] card.   Smoking history noted Review of Systems   See HPI for ROS. Objective:  BP 129/79 mmHg  Pulse 80  Temp(Src) 98.3 F (36.8 C) (Oral)  Wt 298 lb (135.172 kg)  SpO2 95%  LMP 04/14/2016  General: NAD; Obese Cardiac: RRR, normal heart sounds, no murmurs. 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  Recommend lifelong anticoagulation.  We'll continue with Xarelto given that she has a free 30 day trial, but suspect we may have to transition to Coumadin if she does not qualify for Medicaid or [place] card.  - [place] with social worker today to discuss application for financial assistance programs - Continue Xarelto twice a day 3 weeks - Recommend following up in 3 weeks to transition to daily dosing as well as rediscuss transitioning to Coumadin if unable to [place] patient to return sooner if she develops worsening lower extremity swelling, chest pain, shortness of breath

## 2016-04-18 NOTE — Assessment & Plan Note (Signed)
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  Recommend lifelong anticoagulation.  We'll continue with Xarelto given that she has a free 30 day trial, but suspect we may have to transition to Coumadin if she does not qualify for Medicaid or [place] card.  - [place] with social worker today to discuss application for financial assistance programs - Continue Xarelto twice a day 3 weeks - Recommend following up in 3 weeks to transition to daily dosing as well as rediscuss transitioning to Coumadin if unable to [place] patient to return sooner if she develops worsening lower extremity swelling, chest pain, shortness of breath

## 2016-04-18 NOTE — Patient Instructions (Signed)
Continue the Xarelto as prescribed.  - Follow-up with the social worker Neoma Laming as recommended to help you with applying for Medicare/Medicaid as well as the [employer] card [employer] financial assistance)

## 2016-04-18 NOTE — Progress Notes (Signed)
Reason for consultation:  Shaneia had called to comment on her follow-up [place] appointment.  When I called her back, she said she was in the [facility].  I spoke with her to get a sense of how she is handling the recent diagnosis of DVT.  Issues discussed:  Brief review of stressors include:  DVT and the lifestyle changes it will mean, financial difficulties, weight gain, alcohol use (reduced per patient report), mood.    Identified goals:  First things first - attempting to get the medication she needs.  She agreed to meet with [facility] social worker. I touched based with Casimer Lanius and will send her the patient information.  Rydia has follow-up with [place] on June 28th.  We can discuss whether Latuda may be contributing to weight gain.  Also offered ongoing Behavioral Health Consultation.  It is free.  She thinks she might have trouble getting a ride.  Will follow up with this at [facility] appointment.    She frequently calls to cancel her [facility] appointment.  Regular follow-up may be difficult.

## 2016-04-18 NOTE — Progress Notes (Signed)
Patient ID: [name], female   DOB: 10/11/1966, 50 y.o.   MRN: [id number]  CSW consult from Dr. Gwenlyn Saran while patient is in office to assist with resources for medication.  Patient has no insurance.  Per patient, she had Medicaid several years ago but lost coverage when her daughter turned 90.  She has no income.  States has applied for disability last year. CSW provided patient with an application for [facility]) and a list of needed items to process the application.  Also provided patient with information for the Medication Assistance Program (MAP) with Public Health.  Patient is in agreement to contact the MAP for medication assistance and schedule an appointment with the [facility] financial counselor next week to complete the application for her [employer].  Patient will contact CSW if she has questions.  Casimer Lanius. LCSW Clinical Social Work, [place]   [phone] 11:29 AM

## 2016-04-19 ENCOUNTER — Telehealth: Payer: Self-pay | Admitting: Internal Medicine

## 2016-04-19 NOTE — Telephone Encounter (Signed)
Was diagnosed with blood clot in leg. Pt wanders if she can walk for exercise.  Please advise

## 2016-04-20 NOTE — Telephone Encounter (Signed)
Called patient to answer question about walking with DVT. Let her know it is good for her to walk for exercise and that staying active can help prevent blood clots from forming.

## 2016-04-25 ENCOUNTER — Ambulatory Visit: Payer: Medicaid Other | Admitting: Pharmacist

## 2016-05-01 ENCOUNTER — Ambulatory Visit (INDEPENDENT_AMBULATORY_CARE_PROVIDER_SITE_OTHER): Payer: Self-pay | Admitting: Psychology

## 2016-05-01 DIAGNOSIS — Z87891 Personal history of nicotine dependence: Secondary | ICD-10-CM

## 2016-05-01 DIAGNOSIS — F3175 Bipolar disorder, in partial remission, most recent episode depressed: Secondary | ICD-10-CM

## 2016-05-01 NOTE — Assessment & Plan Note (Signed)
Success in the past with Chantax.  Family is helping with her attempts to cut back.  At only 6 cigarettes per day, non-pharmacologic options might be reasonable but patient is very interested in Chantax.  Patient assistance paperwork printed and Dr. Tammi Klippel completed his portion.  She said she would mail today.

## 2016-05-01 NOTE — Assessment & Plan Note (Addendum)
Report of mood is depressed and irritable.  She did not wish to talk at the beginning of the appointment but with silence, eventually started talking.  That is when she began talking about the "baby steps" she is taking to better her health.  She is feeling overwhelmed and said several times that this is "hard."  She runs the risk of flushing things but currently, seems motivated.  Will continue to support her through this process.  She is needing to get connected with the orange card.  Will speak to front office about that and let patient know.  Will also readdress integrated care at a future meeting.    Script for [employer] provided.

## 2016-05-01 NOTE — Patient Instructions (Signed)
Please schedule a follow-up for:  August 2nd at 10:00  We have a program here called Integrated Care.  Behavioral Health Consultants can meet with you to help you work toward your goal of weight loss, stopping smoking, and increased activity.  Call me at [phone] if you want to schedule this.  Dr. Tammi Klippel and I think you are making great steps toward better health.  It is hard.  There may be setbacks.  You can do hard things.  Feel free to call me if you need support.    We gave you paperwork for medication assistance for Chantix.  Hopefully you can get this.

## 2016-05-01 NOTE — Progress Notes (Signed)
Reason for follow-up:  Follow-up mood issues.  She was recently diagnosed with a DVT and was adjusting to what this might mean in terms of medication, finances, and lifestyle changes.  Issues discussed:  DVT.  She blames herself at least in part stating she thinks her smoking cigarettes and lack of activity contributed to her third DVT.  She has been working on activity by walking three days a week for 20 minutes or more this past week.  Is also trying to cut back further on smoking.  States she smokes about 6 per day now.  Success several years ago with Chantix but can't afford it now.  Identified goals:  Try to get Chantix through patient assistance program.  Also mentioned smoking diary.  Not interested in food log or any other tracking of health behaviors.  Might be interested in meeting with [place] for continued work on lifestyle changes IF she can get transportation.  Relies on mom and doesn't want to burden her.

## 2016-05-02 ENCOUNTER — Encounter: Payer: Self-pay | Admitting: Pharmacist

## 2016-05-02 ENCOUNTER — Ambulatory Visit (INDEPENDENT_AMBULATORY_CARE_PROVIDER_SITE_OTHER): Payer: Self-pay | Admitting: Pharmacist

## 2016-05-02 VITALS — BP 140/69 | HR 79 | Ht 64.5 in | Wt 299.2 lb

## 2016-05-02 DIAGNOSIS — Z87891 Personal history of nicotine dependence: Secondary | ICD-10-CM

## 2016-05-02 DIAGNOSIS — J209 Acute bronchitis, unspecified: Secondary | ICD-10-CM

## 2016-05-02 MED ORDER — MOMETASONE FURO-FORMOTEROL FUM 200-5 MCG/ACT IN AERO: 1.0000 | INHALATION_SPRAY | Freq: Two times a day (BID) | RESPIRATORY_TRACT | Status: DC

## 2016-05-02 NOTE — Patient Instructions (Signed)
Thank you for coming in today!  Start using Dulera 1 puff twice a day.   Continue to use your albuterol inhaler as needed for shortness of breath.   Stop smoking 10 to 14 days after your start Chantix.    Follow up with Dr. Valentina Lucks in one month.

## 2016-05-02 NOTE — Progress Notes (Signed)
Patient ID: Carla Hanson, female   DOB: 06-Jul-1966, 50 y.o.   MRN: [id number] Reviewed: Agree with Dr. Graylin Shiver documentation and management.

## 2016-05-02 NOTE — Assessment & Plan Note (Signed)
Spirometry evaluation reveals moderately severe obstructive lung disease corresponding to GOLD Classification B based on spirometry, no history of exacerbations in the past year and CAT score of 11.  Post nebulized albuterol tx revealed significant improvement.  Patient has been experiencing shortness of breath for several months and taking albuterol as needed.  Initiated patient on Dulera 1 puff BID and continued albuterol PRN.  Educated patient on purpose, proper use, potential adverse effects including the risk of esophageal candidiasis and need to rinse mouth after each use.  Instructed on use of inhaler and had patient demonstrate inhaler technique and take initial dose of Dulera in clinic.  Reviewed results of pulmonary function tests.  Pt verbalized understanding of results and education.  Will reassess PFTs at future visit once patient reports she is at 100%.    Moderate Nicotine Dependence of 35 years duration in a patient who is good candidate for success b/c of concern about her lungs.  Initiated varenicline tx.  Patient is in the process of getting Chantix from MAP, patient reports she got her paperwork from MAP yesterday.  Patient counseled on purpose, proper use, and potential adverse effects, including GI upset, and potential change in mood.  Counseled patient to stop smoking 10-14 days after starting Chantix.

## 2016-05-02 NOTE — Assessment & Plan Note (Signed)
Spirometry evaluation reveals moderately severe obstructive lung disease corresponding to GOLD Classification B based on spirometry, no history of exacerbations in the past year and CAT score of 11.  Post nebulized albuterol tx revealed significant improvement.  Patient has been experiencing shortness of breath for several months and taking albuterol as needed.  Initiated patient on Dulera 1 puff BID and continued albuterol PRN.  Educated patient on purpose, proper use, potential adverse effects including the risk of esophageal candidiasis and need to rinse mouth after each use.  Instructed on use of inhaler and had patient demonstrate inhaler technique and take initial dose of Dulera in clinic.  Reviewed results of pulmonary function tests.  Pt verbalized understanding of results and education.  Will reassess PFTs at future visit once patient reports she is at 100%.

## 2016-05-02 NOTE — Progress Notes (Signed)
   S:    Patient arrives in good spirits, ambulating without assistance.  Presents for lung function evaluation and tobacco cessation evaluation.  Patient was referred on 04/03/16.  Patient was last seen by Primary Care Provider on 04/03/16.    Patient reports breathing has been fair - reports feeling winded in the past couple of weeks.  Reports she feels SOB when walking.  Patient reports she is 80% recovered from her acute bronchitis episode in May.    Patient reports last dose of albuterol was several weeks ago.  Reports she only used it when she had bronchitis.    Age when started using tobacco on a daily basis 50 yo. Number of Cigarettes per day 6. Brand smoked [place]. Estimated Nicotine Content per Cigarette (mg) 0.8 to 1.0.  Estimated Nicotine intake per day 6 mg.   Smokes first cigarette 2-3 hours after waking. Denies waking to smoke  Most recent quit attempt was a couple of years ago  Longest time ever been tobacco free: unsure - reports a couple of months.  Medications (NRT, bupropion, varenicline) used in prior in past cessation efforts include: Chantix (reports she thinks it helped and is interested in getting back on it).   Rates IMPORTANCE of quitting tobacco on 1-10 scale of 10. Rates CONFIDENCE of quitting tobacco on 1-10 scale of 3 without Chantix, 10 with Chantix.  Most common triggers to use tobacco include: stress, social    Motivation to quit: lung health   O: mMRC score= 0 CAT score= 11 See Documentation Flowsheet - CAT/COPD for complete symptom scoring.  See "scanned report" or Documentation Flowsheet (discrete results - PFTs) for  Spirometry results. Patient provided good effort while attempting spirometry.   Lung Age = 50 yo Albuterol Neb  Lot# [id number]     Exp. Jan 19  A/P: Spirometry evaluation reveals moderately severe obstructive lung disease corresponding to GOLD Classification B based on spirometry, no history of exacerbations in the past year  and CAT score of 11.  Post nebulized albuterol tx revealed significant improvement.  Patient has been experiencing shortness of breath for several months and taking albuterol as needed.  Initiated patient on Dulera 1 puff BID and continued albuterol PRN.  Educated patient on purpose, proper use, potential adverse effects including the risk of esophageal candidiasis and need to rinse mouth after each use.  Instructed on use of inhaler and had patient demonstrate inhaler technique and take initial dose of Dulera in clinic.  Reviewed results of pulmonary function tests.  Pt verbalized understanding of results and education.  Will reassess PFTs at future visit once patient reports she is at 100%.    Moderate Nicotine Dependence of 35 years duration in a patient who is good candidate for success b/c of concern about her lungs.  Initiated varenicline tx.  Patient is in the process of getting Chantix from MAP, patient reports she got her paperwork from MAP yesterday.  Patient counseled on purpose, proper use, and potential adverse effects, including GI upset, and potential change in mood.  Counseled patient to stop smoking 10-14 days after starting Chantix.    Written pt instructions provided.  Provided information on 1 800-QUIT NOW support program.  F/U Clinic visit in one month.   Total time in face to face counseling 45 minutes.  Patient seen with Elisabeth Most, PharmD Resident.

## 2016-05-08 ENCOUNTER — Encounter: Payer: Self-pay | Admitting: Internal Medicine

## 2016-05-08 ENCOUNTER — Ambulatory Visit (INDEPENDENT_AMBULATORY_CARE_PROVIDER_SITE_OTHER): Payer: Self-pay | Admitting: Internal Medicine

## 2016-05-08 VITALS — BP 137/81 | HR 72 | Temp 98.2°F | Ht 64.5 in | Wt 301.6 lb

## 2016-05-08 DIAGNOSIS — E669 Obesity, unspecified: Secondary | ICD-10-CM

## 2016-05-08 DIAGNOSIS — I82432 Acute embolism and thrombosis of left popliteal vein: Secondary | ICD-10-CM

## 2016-05-08 DIAGNOSIS — Z720 Tobacco use: Secondary | ICD-10-CM

## 2016-05-08 DIAGNOSIS — J449 Chronic obstructive pulmonary disease, unspecified: Secondary | ICD-10-CM

## 2016-05-08 DIAGNOSIS — I82402 Acute embolism and thrombosis of unspecified deep veins of left lower extremity: Secondary | ICD-10-CM

## 2016-05-08 MED ORDER — RIVAROXABAN 20 MG PO TABS: 20.0000 mg | ORAL_TABLET | Freq: Every day | ORAL | Status: DC

## 2016-05-08 NOTE — Patient Instructions (Signed)
Ms. Lu,  Please follow-up with [place] in 2 months to see how weight loss and smoking cessation is going.  Please call at least a week before you are out of xarelto to ensure you don't run out.  Best, Dr. Ola Spurr

## 2016-05-08 NOTE — Progress Notes (Signed)
[facility] Progress Note  Subjective:  Carla Hanson is a 50 y/o female who presents for follow-up of DVT. PMH is significant for HTN, HLD, Obesity, Anxiety, Bipolar Disorder, and Tobacco Abuse.  DVT: - Diagnosed in June, having presented with left leg pain - No longer has leg pain - Pt does not wish to be on long-term anticoagulation but understands she is at high risk of continued recurrence with history of prior DVT and PE and will continue therapy with xarelto at this time.  - States first blood clot in 2013 was triggered by immobility after fracturing her right ankle and that subsequent clots have been due to sedentary lifestyle - Wants to address her risk factors of smoking, HTN, obesity to reduce her risk of recurrence  - Is about to start her 4th week of her xarelto starter pack; is applying to MAP but does not have active coverage yet ROS: No CP, SOB.   Tobacco Abuse: - Has cut back to 5 cigarettes a day - Plans to start Chantix once active in MAP, then will pick a quit date ROS: No cough  Obesity: - Has started walking twice a week. Also trying to increase physical activity by parking far away when running errands. - Has cut out soda from her diet x 1 week. Plans to continue this change.  - Has met with nutritionist before; would like to try making diet modifications on her own at this time  COPD: - Recently started on [facility] after visit for spirometry with Dr. Valentina Lucks - Also takes albuterol prn - States breathing much improved since adding Dulera ROS: Denies wheezing, SOB  No Known Allergies   Social: Current smoker  Objective: Blood pressure 156/70, pulse 72, temperature 98.2 F (36.8 C), temperature source Oral, height 5' 4.5" (1.638 m), weight 301 lb 9.6 oz (136.805 kg), last menstrual period 04/14/2016. Repeat BP 137/81. Constitutional: Obese, pleasant female in NAD Cardiovascular: RRR, S1, S2, no m/r/g.  Pulmonary/Chest: Effort normal and breath  sounds normal. No respiratory distress.  Musculoskeletal: Legs symmetric. No increased swelling of L > R LE. Negative Homan's sign bilaterally. No TTP of LEs. Full ROM.  Skin: Skin is warm and dry. No rash noted. No erythema.  Psychiatric: Normal mood and anxious affect.  Vitals reviewed  Assessment/Plan: Left leg DVT - Improved/stable. Without pain on exam. Has been adherent to xarelto.  - Provided xarelto samples at dose of 20 mg to cover pt for 1.5 months. - Continue to discuss risks and benefits of longterm anticoagulation  Tobacco abuse - Intends to quit with pharmacologic assistance - Will pick quit date once she can receive Chantix through MAP  OBESITY, NOS - Weight stable. Has not lost any weight. - No longer drinking soda x 1 week and trying to increase frequency of walking.   Chronic obstructive pulmonary disease (COPD) (HCC) - GOLD Classification B. Well controlled on current treatment.  - Continue Dulera and albuterol - Pt to quit smoking once on Chantix - Repeat PFTs in a few months to assess for improvement   Follow-up in 1 month to assess success with weight loss and check in on status of MAP, as may require more xarelto samples until fully enrolled.  Olene Floss, MD [place], PGY-2

## 2016-05-10 DIAGNOSIS — J441 Chronic obstructive pulmonary disease with (acute) exacerbation: Secondary | ICD-10-CM

## 2016-05-10 DIAGNOSIS — Z720 Tobacco use: Secondary | ICD-10-CM | POA: Insufficient documentation

## 2016-05-10 HISTORY — DX: Chronic obstructive pulmonary disease with (acute) exacerbation: J44.1

## 2016-05-10 NOTE — Assessment & Plan Note (Signed)
-   Weight stable. Has not lost any weight. - No longer drinking soda x 1 week and trying to increase frequency of walking.

## 2016-05-10 NOTE — Assessment & Plan Note (Signed)
-   GOLD Classification B. Well controlled on current treatment.  - Continue Dulera and albuterol - Pt to quit smoking once on Chantix - Repeat PFTs in a few months to assess for improvement

## 2016-05-10 NOTE — Assessment & Plan Note (Signed)
-   Improved/stable. Without pain on exam. Has been adherent to xarelto.  - Provided xarelto samples at dose of 20 mg to cover pt for 1.5 months. - Continue to discuss risks and benefits of longterm anticoagulation

## 2016-05-10 NOTE — Assessment & Plan Note (Signed)
-   Intends to quit with pharmacologic assistance - Will pick quit date once she can receive Chantix through MAP

## 2016-05-21 ENCOUNTER — Ambulatory Visit: Payer: Medicaid Other

## 2016-05-30 ENCOUNTER — Ambulatory Visit: Payer: Medicaid Other | Admitting: Pharmacist

## 2016-06-05 ENCOUNTER — Ambulatory Visit (INDEPENDENT_AMBULATORY_CARE_PROVIDER_SITE_OTHER): Payer: Self-pay | Admitting: Psychology

## 2016-06-05 ENCOUNTER — Encounter: Payer: Self-pay | Admitting: Psychology

## 2016-06-05 DIAGNOSIS — Z720 Tobacco use: Secondary | ICD-10-CM

## 2016-06-05 DIAGNOSIS — F3175 Bipolar disorder, in partial remission, most recent episode depressed: Secondary | ICD-10-CM

## 2016-06-05 DIAGNOSIS — F1020 Alcohol dependence, uncomplicated: Secondary | ICD-10-CM

## 2016-06-05 NOTE — Progress Notes (Signed)
Reason for follow-up:  Continued mood management and checking in on chronic illness and behaviors that might be negatively impacting her.  Issues discussed:  She did not have anything at the outset.  Does not have Chantix yet but Dr. Tammi Klippel heard from the Patient Assistance Program this morning.  She remains interested in taking it.  Reports drinking about 6 beers a week now.  This feels like a healthier range for her than her previous amount.  Lost her Trazodone script.  Thinks it helps with her sleep and she has not been sleeping well.  Cost was a barrier but she thinks her brother might be able to help her.    Continues to walk nearly every day.  No goal.  No pattern.  Doesn't want either one.    Identified goals:  Get Chantix.  Further contemplate Behavioral Health Consultation.  May ask to see [facility] Consultant when next I to see Dr. Ola Spurr.

## 2016-06-05 NOTE — Assessment & Plan Note (Signed)
Working on quitting.  Dr. Tammi Klippel able to speak to Carla Hanson who should be mailing out Chantix soon.  Possible adverse reactions discussed.  Patient did well on the medication before and is expecting to do the same.

## 2016-06-05 NOTE — Patient Instructions (Signed)
It was good to see you. Please schedule a follow-up:  September 6th at 10:00 Dr. Tammi Klippel says your Chantix is on the way!  Let us know how this goes for you. Keep Latuda the same. Walking is good for you mental health as you know!  [facility] Patients Your primary care provider may refer you to a [place] Consultant for a 15-30 minute visit. The [facility] will focus on a particular problem.  After talking to you, the [facility] will help you make any changes you want to make centered around your health. [facility] can help you with: .             Difficult life problems .             Stress, depression or anxiety .             Coping with medical problems .             Reflect on harmful habits (alcohol, tobacco and drugs),  .             Learning relaxation skills  .             Sleep difficulties  .             Mental health concerns  Call [phone] to schedule an appointment

## 2016-06-05 NOTE — Assessment & Plan Note (Signed)
PHQ-9 of 10.  Sleep is a big issue for her and she is not currently sleeping well.  Presents as aggravated and this is typical for her.  Opens up over the course of the visit, becoming less guarded.  Smiles.  Seems to have some hope for the future regarding her chronic issues and the things she can do to help.

## 2016-06-05 NOTE — Assessment & Plan Note (Signed)
Reported drinks per day and week seem decreased from prior report.  Current report puts her in the 7 drink per week range.  With her mood and health issues however, it would likely be best to abstain.  This is not of interest currently.  She does recognize the beer likely negatively effects her weight.

## 2016-06-06 ENCOUNTER — Encounter: Payer: Self-pay | Admitting: Pharmacist

## 2016-06-06 ENCOUNTER — Ambulatory Visit (INDEPENDENT_AMBULATORY_CARE_PROVIDER_SITE_OTHER): Payer: Self-pay | Admitting: Pharmacist

## 2016-06-06 DIAGNOSIS — Z720 Tobacco use: Secondary | ICD-10-CM

## 2016-06-06 DIAGNOSIS — J209 Acute bronchitis, unspecified: Secondary | ICD-10-CM

## 2016-06-06 DIAGNOSIS — J449 Chronic obstructive pulmonary disease, unspecified: Secondary | ICD-10-CM

## 2016-06-06 MED ORDER — MOMETASONE FURO-FORMOTEROL FUM 200-5 MCG/ACT IN AERO: 1.0000 | INHALATION_SPRAY | Freq: Two times a day (BID) | RESPIRATORY_TRACT | 0 refills | Status: DC

## 2016-06-06 NOTE — Assessment & Plan Note (Signed)
Moderate Nicotine Dependence of  >30 years duration in a patient who is good candidate for success b/c of previous success with use of Chantix.  Patient is currently awaiting varenicline supply (previously prescribed).  Plan to set quit date 1 week after she starts her chantix. Will plan for at least 1-2 months smoke free before taper Chantix. Patient counseled on purpose, proper use, and potential adverse effects, including GI upset, and potential change in mood.   Lung function tests deferred due to current treatment plan changes and desire to check after quitting smoking and increase in dose of Dulera.  Dulera was increased to 2 puffs BID.   Sample provided.

## 2016-06-06 NOTE — Assessment & Plan Note (Signed)
Lung function tests deferred due to current treatment plan changes and desire to check after quitting smoking and increase in dose of Dulera.  Dulera was increased to 2 puffs BID.   Sample provided.

## 2016-06-06 NOTE — Progress Notes (Signed)
   S:  Patient arrives ambulating without assistance in normal spirits.   Patient arrives for evaluation/assistance with tobacco dependence.  Patient last seen in pharmacy clinic on 05/02/2016 and recently seen in by both PCP and [facility].  Upon arrival she states "I don't have to do the breathing test today".  She is currently awaiting for Chantix to arrive from MAP.  She reports that her breathing has improved and after started [facility] has not needed albuterol.  Reports that she skips Xarelto 3 days per week with her cycle.  Reports she rinses out her mouth and denies s/sx of thrush.  She ran out of her [facility] yesterday.  has not had Dulera this AM.   Age when started using tobacco on a daily basis 50 years old. Number of Cigarettes per day 6 for the past 2 months(Down from 10)   Most recent quit attempt a couple years ago. Longest time ever been tobacco free 6 months.  Medications (varenicline) used in prior in past cessation efforts include: Has successfully quit with Chantix in the past. has also tried to quit cold [place] in the past without success.  States the Quit line did not work for her.   Rates IMPORTANCE of quitting tobacco on 1-10 scale of 10. Rates CONFIDENCE of quitting tobacco on 1-10 scale of 10 when she starts her Chantix.  Most common triggers to use tobacco include: Nervous, aggravation, boredom, socially with other smokers.   Motivation to quit: Is tired of smoking and knows risk.    A/P: Moderate Nicotine Dependence of  >30 years duration in a patient who is good candidate for success b/c of previous success with use of Chantix.  Patient is currently awaiting varenicline supply (previously prescribed).  Plan to set quit date 1 week after she starts her chantix. Will plan for at least 1-2 months smoke free before taper Chantix. Patient counseled on purpose, proper use, and potential adverse effects, including GI upset, and potential change in mood.   Lung  function tests deferred due to current treatment plan changes and desire to check after quitting smoking and increase in dose of Dulera.  Dulera was increased to 2 puffs BID.   Sample provided.   Written information provided. Denied interest in using 1-800 quit now support.  F/U Rx Clinic Visit in two weeks.  Total time in face-to-face counseling 15 minutes.  Patient seen with Mechele Dawley, PharmD Candidate and Deirdre Pippins, PharmD Resident and Bennye Alm, PharmD Resident.

## 2016-06-06 NOTE — Patient Instructions (Signed)
When you receive you Chantix from medication assistance please start as directed on packet.  Plan to set quit date 1 week after you receive your Chantix.  Increase Dulera to 2 puffs twice daily   Followup with Dr Valentina Lucks in 2 weeks

## 2016-06-06 NOTE — Progress Notes (Signed)
Patient ID: Carla Hanson, female   DOB: 02-21-1966, 50 y.o.   MRN: [id number] Reviewed.  Agree with Dr. Graylin Shiver documentation and management.

## 2016-06-07 ENCOUNTER — Ambulatory Visit (INDEPENDENT_AMBULATORY_CARE_PROVIDER_SITE_OTHER): Payer: Self-pay | Admitting: Internal Medicine

## 2016-06-07 VITALS — BP 138/78 | HR 67 | Temp 98.4°F | Ht 65.0 in | Wt 306.6 lb

## 2016-06-07 DIAGNOSIS — J449 Chronic obstructive pulmonary disease, unspecified: Secondary | ICD-10-CM

## 2016-06-07 DIAGNOSIS — I82432 Acute embolism and thrombosis of left popliteal vein: Secondary | ICD-10-CM

## 2016-06-07 DIAGNOSIS — Z720 Tobacco use: Secondary | ICD-10-CM

## 2016-06-07 DIAGNOSIS — Z713 Dietary counseling and surveillance: Secondary | ICD-10-CM

## 2016-06-07 DIAGNOSIS — I82402 Acute embolism and thrombosis of unspecified deep veins of left lower extremity: Secondary | ICD-10-CM

## 2016-06-07 NOTE — Patient Instructions (Signed)
Carla Hanson,  Thank you for coming in today. I am sorry for your loss.  I have prescribed another month of xarelto 20 mg to take daily. Once you get your orange card, you can start getting your medications through the medication assistance program.  Please follow-up with me in about 1 month to review weight loss and blood pressure.  Best, Dr. Ola Spurr

## 2016-06-07 NOTE — Progress Notes (Signed)
[facility] Progress Note  Subjective:  Carla Hanson is a 49-y/o female who presents for follow-up to discuss weight loss.  Weight loss: - Admits to not making much progress since last visit but now feels motivated to make changes - Motivation is to improve her health and hopefully get off of blood pressure medications - Believes fried foods have contributed significantly to weight gain - Has been trying to bake meats; now portioning out snacks like chips - Plans to trade out kool-aid for crystal light - Had recent loss in her family of cousin who was shot but denies this is impacting weight and feels she is coping well  Tobacco abuse: - Says chantix has been sent to her through [employer] medication assistance program - Will pick a quit date once she receives medication  DVT: - Taking xarelto 20 mg daily, only has about 15 pills left and needs to go out of town for a couple of weeks for cousin's funeral - Is in process of getting orange card to then be on medication assistance program; thinks this process will be complete in a few weeks ROS: No leg pain, no increased SOB  COPD: - Dulera increased to 2 puffs a day; just started 1 day ago so has not noticed difference    No Known Allergies   Social: Current smoker  Objective: Blood pressure 138/78, pulse 67, temperature 98.4 F (36.9 C), temperature source Oral, height 5\' 5"  (1.651 m), weight (!) 306 lb 9.6 oz (139.1 kg). Constitutional: Well-appearing, obese female in NAD Cardiovascular: RRR, S1, S2, no m/r/g.  Pulmonary/Chest: Effort normal and breath sounds normal. No respiratory distress.  Abdominal: Soft. +BS, soft, NT, ND, no rebound or guarding.  Musculoskeletal: Legs symmetric. No erythema or TTP. Psychiatric: Normal mood and somewhat flat affect.  Vitals reviewed  Assessment/Plan: Encounter for weight loss counseling - Patient has concrete goals of reducing portions, making more baked foods, and  reducing sugary beverage intake - Likes to walk - Does not want to meet with nutritionist at this time; agreeable to follow-up in clinic to assess progress  Tobacco abuse - Has been cutting back and intends to quit using chantix once Rx received  Left leg DVT - Gave another month of xarelto samples (#28) - Patient to receive pills through MAP once qualified  Chronic obstructive pulmonary disease (COPD) ([place]) - Following with Dr. Valentina Lucks - Plan for repeat PFTs once has quit smoking and trialed increase dose of dulera    Follow-up in about 1 month to assess weight loss.   Olene Floss, MD [place], PGY-2

## 2016-06-09 ENCOUNTER — Encounter: Payer: Self-pay | Admitting: Internal Medicine

## 2016-06-09 DIAGNOSIS — Z713 Dietary counseling and surveillance: Secondary | ICD-10-CM | POA: Insufficient documentation

## 2016-06-09 MED ORDER — RIVAROXABAN 20 MG PO TABS: 20.0000 mg | ORAL_TABLET | Freq: Every day | ORAL | 0 refills | Status: DC

## 2016-06-09 NOTE — Assessment & Plan Note (Signed)
-   Gave another month of xarelto samples (#28) - Patient to receive pills through MAP once qualified

## 2016-06-09 NOTE — Assessment & Plan Note (Signed)
-   Patient has concrete goals of reducing portions, making more baked foods, and reducing sugary beverage intake - Likes to walk - Does not want to meet with nutritionist at this time; agreeable to follow-up in clinic to assess progress

## 2016-06-09 NOTE — Assessment & Plan Note (Signed)
-   Following with Dr. Valentina Lucks - Plan for repeat PFTs once has quit smoking and trialed increase dose of dulera

## 2016-06-09 NOTE — Assessment & Plan Note (Signed)
-   Has been cutting back and intends to quit using chantix once Rx received

## 2016-06-14 ENCOUNTER — Telehealth: Payer: Self-pay | Admitting: Psychology

## 2016-06-14 NOTE — Telephone Encounter (Signed)
Patient contacted pharmaceutical company.  They said medication was mailed to [facility] office on 8/3.  Dr. Tammi Klippel hasn't seen it yet.  Will work to track it down and update the patient.  She is headed out of town for a few weeks and would like it before she goes.

## 2016-06-20 ENCOUNTER — Ambulatory Visit: Payer: Medicaid Other | Admitting: Pharmacist

## 2016-06-26 ENCOUNTER — Telehealth: Payer: Self-pay | Admitting: Internal Medicine

## 2016-06-26 NOTE — Telephone Encounter (Signed)
I have the Chantix in my office but don't know anything about the Xarelto.  Can you clarify Carla Hanson?

## 2016-06-26 NOTE — Telephone Encounter (Signed)
Pt will be back in town on Friday and will be able to pick up her  Chantix and Zarelto that day.

## 2016-06-28 ENCOUNTER — Telehealth: Payer: Self-pay | Admitting: Internal Medicine

## 2016-07-01 ENCOUNTER — Encounter: Payer: Self-pay | Admitting: Pharmacist

## 2016-07-01 ENCOUNTER — Ambulatory Visit (INDEPENDENT_AMBULATORY_CARE_PROVIDER_SITE_OTHER): Payer: Self-pay | Admitting: Pharmacist

## 2016-07-01 DIAGNOSIS — Z720 Tobacco use: Secondary | ICD-10-CM

## 2016-07-01 DIAGNOSIS — J209 Acute bronchitis, unspecified: Secondary | ICD-10-CM

## 2016-07-01 DIAGNOSIS — J449 Chronic obstructive pulmonary disease, unspecified: Secondary | ICD-10-CM

## 2016-07-01 MED ORDER — MOMETASONE FURO-FORMOTEROL FUM 200-5 MCG/ACT IN AERO: 2.0000 | INHALATION_SPRAY | Freq: Two times a day (BID) | RESPIRATORY_TRACT | 0 refills | Status: DC

## 2016-07-01 MED ORDER — MOMETASONE FURO-FORMOTEROL FUM 200-5 MCG/ACT IN AERO: 2.0000 | INHALATION_SPRAY | Freq: Two times a day (BID) | RESPIRATORY_TRACT | 11 refills | Status: DC

## 2016-07-01 NOTE — Progress Notes (Signed)
   S:    Patient arrives in usual state of health, ambulating without assistance.  Presents for lung function evaluation and follow up on new Chantix start/smoking cessation.   Patient was last seen in pharmacy clinic on 06/06/16 and seen by Primary Care Provider on 06/07/16.  Patient reports breathing has been worse since she has run out of [place].  Patient reports last dose of Dulera was at least 1 week ago as she ran out while she was vacation. She last used her albuterol inhaler this AM however pt reports she has only needed to use it 3x since last appointment. Pt states that she did not appreciate an improvement in her breathing after uptitration of Dulera at last visit; however, she notices her breathing has worsened since she ran out of [place].  Pt reports she started taking Chantix 3 days ago and reports she is tolerating well despite a "stupid, funny" dream. She reports she has a harder time falling asleep but the dream did not bother her. Pt states she does not take Chantix with food but denies nausea.   Pt reports nothing has changed with smoking status, still smoking ~6 cigarettes/day but plans to quit on Saturday (1 week after starting Chantix).  Pt is not interested in PFTs today as she has not quit smoking yet. Rates CONFIDENCE of quitting tobacco on 1-10 scale of 10 after starting Chantix.  P: Moderate Nicotine Dependence of  >30 years duration in a patient who is good candidate for success b/c of previous success with use of Chantix.  Patient has recently started varenicline (previously prescribed).  Quit date is this Saturday. Will plan for at least 1-2 months smoke free before taper varenicline as long as pt is tolerating. Patient counseled on purpose, proper use, and potential adverse effects, including GI upset, and potential change in mood.    Current smoker experiencing difficulty breathing and taking Dulera, proair. no change to  treatment plan at this time. Provided pt with two  Dulera sample inhalers for 1 month supply. Prescription for [facility] sent to [facility]. F/U with a telephone call in 1 week.Lung function tests deferred due to current treatment plan changes and desire to check after quitting smoking and restarting Dulera.  Written information provided. F/U Rx Clinic Visit with telephone call in one week.  Total time in face-to-face counseling 20 minutes.  Patient seen with Mechele Dawley, PharmD Candidate and Deirdre Pippins, PharmD Resident.

## 2016-07-01 NOTE — Assessment & Plan Note (Signed)
Moderate Nicotine Dependence of  >30 years duration in a patient who is good candidate for success b/c of previous success with use of Chantix.  Patient has recently started varenicline (previously prescribed).  Quit date is this Saturday. Will plan for at least 1-2 months smoke free before taper varenicline as long as pt is tolerating. Patient counseled on purpose, proper use, and potential adverse effects, including GI upset, and potential change in mood.

## 2016-07-01 NOTE — Progress Notes (Signed)
Patient ID: Carla Hanson, female   DOB: January 13, 1966, 50 y.o.   MRN: [id number] Reviewed: Agree with Dr. Graylin Shiver documentation and management.

## 2016-07-01 NOTE — Assessment & Plan Note (Signed)
Current smoker experiencing difficulty breathing and taking Dulera, proair. no change to treatment plan at this time. Provided pt with two Dulera sample inhalers for 1 month supply. Prescription for [facility] sent to [facility]. F/U with a telephone call in 1 week.Lung function tests deferred due to current treatment plan changes and desire to check after quitting smoking and restarting Dulera.

## 2016-07-01 NOTE — Patient Instructions (Addendum)
Thank you for coming to see [place] today!   1. Keep following your Chantix pill pack as directed. Take it with food to prevent nausea. If your dreams become worrisome, we can adjust the Chantix schedule. 2. Your quit day is Saturday-You can do it! Think about your "special treat" for Saturday when you quit! 3. Go to the health department to set up your eligibility so you can get your [facility]. It may take ~1 month for MAP to get the [facility] in.   We will call you next week to see how you are doing.

## 2016-07-10 ENCOUNTER — Ambulatory Visit: Payer: Medicaid Other | Admitting: Psychology

## 2016-07-11 ENCOUNTER — Telehealth: Payer: Self-pay | Admitting: Pharmacist

## 2016-07-11 NOTE — Telephone Encounter (Signed)
Called patient in follow up of quit smoking date.  she admits to smoking 1 cigarettes since quit date.   She continues to work on complete abstinence from tobacco.  She was encouraged to continue her work on quitting.  Follow up plan 1 month - PFTs in pharmacy clinic.

## 2016-07-19 ENCOUNTER — Ambulatory Visit (INDEPENDENT_AMBULATORY_CARE_PROVIDER_SITE_OTHER): Payer: Self-pay | Admitting: Internal Medicine

## 2016-07-19 DIAGNOSIS — Z720 Tobacco use: Secondary | ICD-10-CM

## 2016-07-19 DIAGNOSIS — I82402 Acute embolism and thrombosis of unspecified deep veins of left lower extremity: Secondary | ICD-10-CM

## 2016-07-19 DIAGNOSIS — I82432 Acute embolism and thrombosis of left popliteal vein: Secondary | ICD-10-CM

## 2016-07-19 DIAGNOSIS — E669 Obesity, unspecified: Secondary | ICD-10-CM

## 2016-07-19 MED ORDER — RIVAROXABAN 20 MG PO TABS: 20.0000 mg | ORAL_TABLET | Freq: Every day | ORAL | 0 refills | Status: DC

## 2016-07-19 NOTE — Progress Notes (Signed)
[facility] Progress Note  Subjective:  Carla Hanson is a 49-y/o female who presents to follow-up weight loss. PMH is significant for recent DVT, HTN, HLD, Obesity, Anxiety, Bipolar Disorder, and Tobacco Abuse.  Obesity: - Has been focusing on reducing portion size - Has replaced sugary beverages (kool-aid) with water - Walks about 3 times a week around the neighborhood, could not give defined period of time - Goal is to lose enough weight to become comfortable running again. Says she loved to run when she was younger. - Takes care of 2-y/o grandson during the day, which also keeps her active  Recent DVT: - Has only 1 week left of xarelto samples - Is now signed up for [employer] but has yet to enroll in MAP. Says she plans to get enrolled next week but is unsure how long it will take for her to get approved. ROS: No CP, no increased SOB  Tobacco abuse: - Taking chantix - Notices improvement in cravings - Is still smoking about 2 cigarettes every other day - Has had some weird/vivid dreams but does not bother her  No Known Allergies  Objective: Blood pressure 140/67, pulse 80, temperature 98.4 F (36.9 C), temperature source Oral, height 5\' 5"  (1.651 m), weight (!) 307 lb (139.3 kg), last menstrual period 07/12/2016. Constitutional: Obese, pleasant female, in NAD Cardiovascular: RRR, S1, S2, no m/r/g.  Pulmonary/Chest: Effort normal and breath sounds normal. No respiratory distress.  Musculoskeletal: No LE edema.  Psychiatric: Normal mood and affect.  Vitals reviewed  Wt 307 lbs, compared to 311 lb 12.8 oz 07/01/16. Wt 306 lb 9.6 oz 06/07/16.   Assessment/Plan: OBESITY, NOS - Pt very motivated to lose weight. Would like to be fit enough to run again and reduce need for medications. Plans to increase frequency of walking and continue to focus on portion size. Has substituted water for sugary beverages. - Provided encouragement and congratulated on 4 lb weight  loss since last visit.   Left leg DVT - Provided 1 month of xarelto samples. Urged patient to enroll in MAP as soon as possible for continued supply.   Tobacco abuse - Has had success cutting back on chantix. Still smoking every other day. - Has follow-up with Dr. Valentina Lucks in October.   Follow-up in next few months to follow-up weight loss.  Olene Floss, MD [place], PGY-2

## 2016-07-19 NOTE — Patient Instructions (Signed)
Ms. Ho,  Thank you for coming in today!  Please follow-up at your convenience in the next few months to follow-up on weight loss or sooner if anything comes up.  Best, Dr. Ola Spurr

## 2016-07-21 ENCOUNTER — Encounter: Payer: Self-pay | Admitting: Internal Medicine

## 2016-07-21 NOTE — Assessment & Plan Note (Addendum)
-   Provided 1 month of xarelto samples. Urged patient to enroll in MAP as soon as possible for continued supply.

## 2016-07-21 NOTE — Assessment & Plan Note (Addendum)
-   Pt very motivated to lose weight. Would like to be fit enough to run again and reduce need for medications. Plans to increase frequency of walking and continue to focus on portion size. Has substituted water for sugary beverages. - Provided encouragement and congratulated on 4 lb weight loss since last visit.

## 2016-07-21 NOTE — Assessment & Plan Note (Signed)
-   Has had success cutting back on chantix. Still smoking every other day. - Has follow-up with Dr. Valentina Lucks in October.

## 2016-07-24 ENCOUNTER — Ambulatory Visit (INDEPENDENT_AMBULATORY_CARE_PROVIDER_SITE_OTHER): Payer: Self-pay | Admitting: Psychology

## 2016-07-24 DIAGNOSIS — F3175 Bipolar disorder, in partial remission, most recent episode depressed: Secondary | ICD-10-CM

## 2016-07-24 NOTE — Patient Instructions (Signed)
Please schedule a follow-up for:  October 18th at 10:30.    I am sorry you have been unable to sleep.  Let us know if you would like a script for Trazodone.

## 2016-07-24 NOTE — Assessment & Plan Note (Signed)
Hard to elicit information secondary to presentation.  This feels at least partially personality mediated.  Her mood disorder and not sleeping well affect her presentation as well.  She has expressed interest in Trazodone but reports she can't afford.  Patient Assistance fund has helped on one occasion to secure this medicine.  Typically, her initial presentation is similar and she eventually softens and makes good use of the appointment.  She was not able to get there today despite our efforts.  We offered a three month follow-up (given her propensity to cancel) and she requested one month.  This is confusing to the treatment team as she almost always states she has nothing to discuss and doesn't want to talk.  We might make this a more explicit part of the conversation next visit.

## 2016-07-24 NOTE — Progress Notes (Signed)
Reason for follow-up:  Carla Hanson presented for follow-up of mood and smoking cessation.  Issues discussed:  She reported she was tired, didn't want to talk, had nothing to discuss, and attended the appointment only because she "didn't want to miss another appointment."    Identified goals:  None this visit.

## 2016-08-01 ENCOUNTER — Encounter: Payer: Self-pay | Admitting: Pharmacist

## 2016-08-01 NOTE — Progress Notes (Signed)
Medication Samples have been provided to the patient's mother who was seen in clinic today. Patient was available via phone.   She states she hopes to have MAP medication of Dulera within the next 4-8 weeks.   She states she is smoking.   She is willing to attempt a complete quit within the next week or two.   She was willing to follow up in pharmacy clinic if she is still smoking in 3 weeks.    Drug name: [name]       Strength: 200/5        Qty: 2  LOTRA:[id number]  Exp.Date: 05/19/2017  Dosing instructions: 2 inhalations twice daily.   The patient has been instructed regarding the correct time, dose, and frequency of taking this medication, including desired effects and most common side effects.   Leavy Cella 5:06 PM 08/01/2016

## 2016-08-21 ENCOUNTER — Ambulatory Visit (INDEPENDENT_AMBULATORY_CARE_PROVIDER_SITE_OTHER): Payer: Self-pay | Admitting: Psychology

## 2016-08-21 DIAGNOSIS — F3175 Bipolar disorder, in partial remission, most recent episode depressed: Secondary | ICD-10-CM

## 2016-08-21 NOTE — Progress Notes (Signed)
Reason for follow-up:  Carla Hanson presents for continued mood management and work on smoking cessation.  She brings in paperwork from [place] in hopes of getting more Chantix.  Issues discussed:  Started Chantix about 6 weeks ago.  Smoked last cigarette yesterday.  Reports vivid dreams and no cravings for cigarettes.  She plans to stay quit.  Denies drinking during the week.  Drinks about 6 beers each weekend day.  Likes beer.  Is having difficulty staying alone at her house.  Feels lonely.  Not fearful or afraid but sad.  Is coping by spending most nights at her mother's or daughter's house.  She does not think they mind terribly but she thinks that she needs to learn how to live alone.    Identified goals:  Get more comfortable living alone.

## 2016-08-21 NOTE — Patient Instructions (Signed)
Please call me at [phone] when you figure out a time your mom can bring you back.  Great job with your smoking.  Keep up the good work.    There are a number of books available for learning ways others have manage living alone.  You may want to see if you can get one - [employer] is a good option.  One thing to consider is structure / purpose.  People without either one of these tend to find themselves feeling down and a bit lost.  Sounds like cleaning and going to your mom's or daughter's are the main ways you structure your life.  I don't hear much purpose or meaning.  If you would think about that before we meet again, I would love to have a conversation with you.

## 2016-08-21 NOTE — Assessment & Plan Note (Signed)
Report of mood is "better" today.  Notes 2-3 weeks of pretty low mood but feeling like she is moving out of it a bit today.  PHQ-9 was 15 with "very difficult."  Affect is notably brighter.  There is some irritability at the outset but it seems to dissipate quickly.  She smiles frequently and is interactive and receptive.  This is a marked change from recent visits.    She says she is interested in meeting individually to work on loneliness.  I think her avoidance is likely further reinforcing the feeling and the behavior.  See patient instructions for further plan.

## 2016-08-28 ENCOUNTER — Other Ambulatory Visit: Payer: Self-pay | Admitting: Family Medicine

## 2016-08-28 DIAGNOSIS — Z1231 Encounter for screening mammogram for malignant neoplasm of breast: Secondary | ICD-10-CM

## 2016-09-04 ENCOUNTER — Other Ambulatory Visit: Payer: Self-pay | Admitting: Internal Medicine

## 2016-09-04 ENCOUNTER — Telehealth: Payer: Self-pay | Admitting: Internal Medicine

## 2016-09-04 DIAGNOSIS — J449 Chronic obstructive pulmonary disease, unspecified: Secondary | ICD-10-CM

## 2016-09-04 DIAGNOSIS — J209 Acute bronchitis, unspecified: Secondary | ICD-10-CM

## 2016-09-04 DIAGNOSIS — I10 Essential (primary) hypertension: Secondary | ICD-10-CM

## 2016-09-04 DIAGNOSIS — R062 Wheezing: Secondary | ICD-10-CM

## 2016-09-04 MED ORDER — MOMETASONE FURO-FORMOTEROL FUM 200-5 MCG/ACT IN AERO: 2.0000 | INHALATION_SPRAY | Freq: Two times a day (BID) | RESPIRATORY_TRACT | 1 refills | Status: DC

## 2016-09-04 MED ORDER — OLMESARTAN MEDOXOMIL-HCTZ 20-12.5 MG PO TABS: 1.0000 | ORAL_TABLET | Freq: Every day | ORAL | 3 refills | Status: DC

## 2016-09-04 MED ORDER — ALBUTEROL SULFATE HFA 108 (90 BASE) MCG/ACT IN AERS: 1.0000 | INHALATION_SPRAY | Freq: Four times a day (QID) | RESPIRATORY_TRACT | 1 refills | Status: DC | PRN

## 2016-09-04 NOTE — Telephone Encounter (Signed)
Received fax from MAP saying lisinopril HCTZ 20/25 not available for free but benicar HCTZ is. Called patient to ask her preference. She said she would like to try the benicar but will finish her current prescription first. Asked her to make an appointment about 2 weeks after starting benicar HCTZ. Will fax Rx to MAP. Combination pill with 20 mg benicar only has option of 12.5 mg HCTZ. May need to increase HCTZ component separately if inadequate BP control at follow-up or increase to 40/25 dosing.   Olene Floss, MD [place], PGY-2

## 2016-09-04 NOTE — Telephone Encounter (Signed)
Pt would like to know if Dr. Valentina Lucks has any more samples of Dulera. Pt only has 4 pumps left. Pt would like to pick up today. Please advise. Thanks! ep

## 2016-09-04 NOTE — Telephone Encounter (Signed)
Spoke with patient and informed her that we don't have any samples as of today but maybe there would be a shipment in a couple of days.  Advised her to check back tomorrow afternoon or Friday morning since she is waiting for the map program to get her medication in. [facility]

## 2016-09-09 ENCOUNTER — Other Ambulatory Visit: Payer: Self-pay | Admitting: Internal Medicine

## 2016-09-09 DIAGNOSIS — J209 Acute bronchitis, unspecified: Secondary | ICD-10-CM

## 2016-09-09 DIAGNOSIS — J449 Chronic obstructive pulmonary disease, unspecified: Secondary | ICD-10-CM

## 2016-09-11 NOTE — Telephone Encounter (Signed)
Needs some more samples Xarelto.  Could they be mailed to pt? She is having car trouble and cannot come pick them up. She is also out of dulara . Dr Valentina Lucks said he was expecting some but it hadnt come in.

## 2016-09-12 MED ORDER — MOMETASONE FURO-FORMOTEROL FUM 200-5 MCG/ACT IN AERO: 2.0000 | INHALATION_SPRAY | Freq: Two times a day (BID) | RESPIRATORY_TRACT | 1 refills | Status: DC

## 2016-09-12 MED ORDER — RIVAROXABAN 20 MG PO TABS: 20.0000 mg | ORAL_TABLET | Freq: Every day | ORAL | 0 refills | Status: DC

## 2016-09-12 NOTE — Addendum Note (Signed)
Addended by: Levert Feinstein F on: 09/12/2016 10:31 AM   Modules accepted: Orders

## 2016-09-12 NOTE — Telephone Encounter (Signed)
Spoke with patient, informed her that we do have some samples of each medication but we would not be able to mail them to her. Patient states she will come pick up medications, will leave samples up front. FYI to PCP.

## 2016-09-18 ENCOUNTER — Other Ambulatory Visit: Payer: Self-pay | Admitting: *Deleted

## 2016-09-18 DIAGNOSIS — I10 Essential (primary) hypertension: Secondary | ICD-10-CM

## 2016-09-23 ENCOUNTER — Other Ambulatory Visit: Payer: Self-pay | Admitting: *Deleted

## 2016-09-23 DIAGNOSIS — I10 Essential (primary) hypertension: Secondary | ICD-10-CM

## 2016-09-23 MED ORDER — LISINOPRIL-HYDROCHLOROTHIAZIDE 20-25 MG PO TABS: 1.0000 | ORAL_TABLET | Freq: Every day | ORAL | 3 refills | Status: DC

## 2016-09-23 NOTE — Telephone Encounter (Signed)
Patient called and states that she does want to still fill her lisinopril and that it is something that she can afford with MAP.  Will forward to MD to fax this to the health department since they share the same fax number. Jazmin Hartsell,CMA

## 2016-09-30 ENCOUNTER — Other Ambulatory Visit: Payer: Self-pay | Admitting: Internal Medicine

## 2016-09-30 DIAGNOSIS — I82499 Acute embolism and thrombosis of other specified deep vein of unspecified lower extremity: Secondary | ICD-10-CM

## 2016-09-30 NOTE — Telephone Encounter (Signed)
Pt would like a refill on Xarelto and would like it as soon as possible. Pt would like someone to call her when Rx is ready for pick up. Please advise. Thanks! ep

## 2016-10-02 NOTE — Telephone Encounter (Signed)
Pt called again about her xarelto. She says she was only given a 2week supply and she hasnt had any pills in 2 weeks.  She says she needs to take this everyday. Please advise

## 2016-10-03 MED ORDER — RIVAROXABAN 20 MG PO TABS: 20.0000 mg | ORAL_TABLET | Freq: Every day | ORAL | 11 refills | Status: DC

## 2016-10-03 MED ORDER — RIVAROXABAN 20 MG PO TABS: 20.0000 mg | ORAL_TABLET | Freq: Every day | ORAL | 0 refills | Status: DC

## 2016-10-03 NOTE — Telephone Encounter (Signed)
Called patient about xarelto. She now has free coverage through [employer] for 1 year but needs a prescription. She also requests about 2 weeks' worth of samples until she can pick up the Rx. I will send prescription to [facility] on [address] and write for 2 weeks of sample. Please pull samples for patient. She is hoping to pick them up this afternoon.

## 2016-10-11 ENCOUNTER — Ambulatory Visit: Payer: Medicaid Other | Admitting: Internal Medicine

## 2016-10-14 ENCOUNTER — Telehealth: Payer: Self-pay | Admitting: Psychology

## 2016-10-14 NOTE — Telephone Encounter (Signed)
Patient called to let me know the Chantix that Dr. Tammi Klippel mailed never arrived.  I emailed Dr. Tammi Klippel to determine next steps.  Patient called again.  Will follow-up with Dr. Tammi Klippel ASAP.  Patient reports smoking 2-3 cigarettes a day.  Thinks Chantix would help her stop all together.  Patient does not know if she will be able to attend 12/20 appointment secondary to transportation.  Her mom's cataracts have gotten so bad she isn't driving.  This is Carla Hanson's normal ride.  She will try to find another one and let me know.

## 2016-10-18 NOTE — Telephone Encounter (Signed)
Dr. Tammi Klippel did not have tracking on the package.  It was likely lost in the mail.  [place] Patient Assitance program [phone] to see if we could get more mailed out.  Spoke with Carla Hanson.  She authorized the medicine to be reordered on 12/19 (the day patient was due for a reorder).  Will have it mailed to the [facility] where [name] can pick it up.

## 2016-10-31 ENCOUNTER — Observation Stay (HOSPITAL_COMMUNITY): Payer: Self-pay

## 2016-10-31 ENCOUNTER — Emergency Department (HOSPITAL_COMMUNITY): Payer: Self-pay

## 2016-10-31 ENCOUNTER — Encounter (HOSPITAL_COMMUNITY): Payer: Self-pay | Admitting: Nurse Practitioner

## 2016-10-31 ENCOUNTER — Inpatient Hospital Stay (HOSPITAL_COMMUNITY)
Admission: EM | Admit: 2016-10-31 | Discharge: 2016-11-03 | DRG: 194 | Disposition: A | Discharge status: Home / Self Care | Payer: Self-pay | Attending: Family Medicine | Admitting: Family Medicine

## 2016-10-31 DIAGNOSIS — J449 Chronic obstructive pulmonary disease, unspecified: Secondary | ICD-10-CM

## 2016-10-31 DIAGNOSIS — I10 Essential (primary) hypertension: Secondary | ICD-10-CM | POA: Diagnosis present

## 2016-10-31 DIAGNOSIS — I82402 Acute embolism and thrombosis of unspecified deep veins of left lower extremity: Secondary | ICD-10-CM | POA: Diagnosis present

## 2016-10-31 DIAGNOSIS — E785 Hyperlipidemia, unspecified: Secondary | ICD-10-CM | POA: Diagnosis present

## 2016-10-31 DIAGNOSIS — Z7901 Long term (current) use of anticoagulants: Secondary | ICD-10-CM

## 2016-10-31 DIAGNOSIS — Z86718 Personal history of other venous thrombosis and embolism: Secondary | ICD-10-CM

## 2016-10-31 DIAGNOSIS — J189 Pneumonia, unspecified organism: Principal | ICD-10-CM | POA: Diagnosis present

## 2016-10-31 DIAGNOSIS — F17210 Nicotine dependence, cigarettes, uncomplicated: Secondary | ICD-10-CM | POA: Diagnosis present

## 2016-10-31 DIAGNOSIS — Z6841 Body Mass Index (BMI) 40.0 and over, adult: Secondary | ICD-10-CM

## 2016-10-31 DIAGNOSIS — F319 Bipolar disorder, unspecified: Secondary | ICD-10-CM | POA: Diagnosis present

## 2016-10-31 DIAGNOSIS — J181 Lobar pneumonia, unspecified organism: Secondary | ICD-10-CM

## 2016-10-31 DIAGNOSIS — Z86711 Personal history of pulmonary embolism: Secondary | ICD-10-CM | POA: Diagnosis present

## 2016-10-31 DIAGNOSIS — J440 Chronic obstructive pulmonary disease with acute lower respiratory infection: Secondary | ICD-10-CM | POA: Diagnosis present

## 2016-10-31 DIAGNOSIS — E876 Hypokalemia: Secondary | ICD-10-CM | POA: Diagnosis present

## 2016-10-31 DIAGNOSIS — R0600 Dyspnea, unspecified: Secondary | ICD-10-CM | POA: Diagnosis present

## 2016-10-31 DIAGNOSIS — J441 Chronic obstructive pulmonary disease with (acute) exacerbation: Secondary | ICD-10-CM | POA: Diagnosis present

## 2016-10-31 LAB — BASIC METABOLIC PANEL
ANION GAP: 8 (ref 5–15)
BUN: 6 mg/dL (ref 6–20)
CALCIUM: 8.2 mg/dL — AB (ref 8.9–10.3)
CO2: 23 mmol/L (ref 22–32)
CREATININE: 0.66 mg/dL (ref 0.44–1.00)
Chloride: 104 mmol/L (ref 101–111)
GFR calc Af Amer: >60 mL/min (ref >60)
GFR calc non Af Amer: >60 mL/min (ref >60)
GLUCOSE: 153 mg/dL — AB (ref 65–99)
Potassium: 3 mmol/L — ABNORMAL LOW (ref 3.5–5.1)
Sodium: 135 mmol/L (ref 135–145)

## 2016-10-31 LAB — CBC WITH DIFFERENTIAL/PLATELET
Basophils Absolute: 0 10*3/uL (ref 0.0–0.1)
Basophils Relative: 0 %
EOS ABS: 0 10*3/uL (ref 0.0–0.7)
Eosinophils Relative: 0 %
HCT: 34.2 % — ABNORMAL LOW (ref 36.0–46.0)
HEMOGLOBIN: 10.8 g/dL — AB (ref 12.0–15.0)
Lymphocytes Relative: 13 %
Lymphs Abs: 0.8 10*3/uL (ref 0.7–4.0)
MCH: 22.6 pg — AB (ref 26.0–34.0)
MCHC: 31.6 g/dL (ref 30.0–36.0)
MCV: 71.7 fL — ABNORMAL LOW (ref 78.0–100.0)
MONOS PCT: 3 %
Monocytes Absolute: 0.2 10*3/uL (ref 0.1–1.0)
NEUTROS PCT: 84 %
Neutro Abs: 4.8 10*3/uL (ref 1.7–7.7)
Platelets: 320 10*3/uL (ref 150–400)
RBC: 4.77 MIL/uL (ref 3.87–5.11)
RDW: 17.3 % — ABNORMAL HIGH (ref 11.5–15.5)
WBC: 5.8 10*3/uL (ref 4.0–10.5)

## 2016-10-31 LAB — I-STAT CG4 LACTIC ACID, ED
LACTIC ACID, VENOUS: 3.87 mmol/L — AB (ref 0.5–1.9)
Lactic Acid, Venous: 3.97 mmol/L (ref 0.5–1.9)

## 2016-10-31 LAB — URINALYSIS, ROUTINE W REFLEX MICROSCOPIC
Bilirubin Urine: NEGATIVE
GLUCOSE, UA: 150 mg/dL — AB
Hgb urine dipstick: NEGATIVE
KETONES UR: NEGATIVE mg/dL
LEUKOCYTES UA: NEGATIVE
Nitrite: NEGATIVE
PH: 6 (ref 5.0–8.0)
Protein, ur: NEGATIVE mg/dL
Specific Gravity, Urine: 1.005 (ref 1.005–1.030)

## 2016-10-31 LAB — STREP PNEUMONIAE URINARY ANTIGEN: STREP PNEUMO URINARY ANTIGEN: NEGATIVE

## 2016-10-31 LAB — LACTIC ACID, PLASMA
LACTIC ACID, VENOUS: 1.4 mmol/L (ref 0.5–1.9)
Lactic Acid, Venous: 1.5 mmol/L (ref 0.5–1.9)

## 2016-10-31 LAB — TROPONIN I: Troponin I: <0.03 ng/mL (ref <0.03)

## 2016-10-31 MED ORDER — SODIUM CHLORIDE 0.9 % IV SOLN
INTRAVENOUS | Status: AC
Start: 2016-10-31 — End: 2016-11-01
  Administered 2016-10-31: 21:00:00 via INTRAVENOUS

## 2016-10-31 MED ORDER — RIVAROXABAN 20 MG PO TABS
20.0000 mg | ORAL_TABLET | Freq: Every day | ORAL | Status: DC
  Administered 2016-10-31 – 2016-11-02 (×3): 20 mg via ORAL
  Filled 2016-10-31 (×3): qty 1

## 2016-10-31 MED ORDER — IPRATROPIUM-ALBUTEROL 0.5-2.5 (3) MG/3ML IN SOLN: 3.0000 mL | Freq: Four times a day (QID) | RESPIRATORY_TRACT | Status: DC

## 2016-10-31 MED ORDER — OLMESARTAN MEDOXOMIL-HCTZ 20-12.5 MG PO TABS: 1.0000 | ORAL_TABLET | Freq: Every day | ORAL | Status: DC

## 2016-10-31 MED ORDER — ONDANSETRON HCL 4 MG/2ML IJ SOLN: 4.0000 mg | Freq: Four times a day (QID) | INTRAMUSCULAR | Status: DC | PRN

## 2016-10-31 MED ORDER — IOPAMIDOL (ISOVUE-370) INJECTION 76%
100.0000 mL | Freq: Once | INTRAVENOUS | Status: AC | PRN
  Administered 2016-10-31: 100 mL via INTRAVENOUS

## 2016-10-31 MED ORDER — SODIUM CHLORIDE 0.9 % IV BOLUS (SEPSIS)
1000.0000 mL | Freq: Once | INTRAVENOUS | Status: AC
  Administered 2016-10-31: 1000 mL via INTRAVENOUS

## 2016-10-31 MED ORDER — ACETAMINOPHEN 325 MG PO TABS: 650.0000 mg | ORAL_TABLET | Freq: Four times a day (QID) | ORAL | Status: DC | PRN

## 2016-10-31 MED ORDER — HYDROCHLOROTHIAZIDE 12.5 MG PO CAPS
12.5000 mg | ORAL_CAPSULE | Freq: Every day | ORAL | Status: DC
  Administered 2016-11-01 – 2016-11-03 (×3): 12.5 mg via ORAL
  Filled 2016-10-31 (×3): qty 1

## 2016-10-31 MED ORDER — ALBUTEROL SULFATE (2.5 MG/3ML) 0.083% IN NEBU
INHALATION_SOLUTION | RESPIRATORY_TRACT | Status: AC
  Filled 2016-10-31: qty 3

## 2016-10-31 MED ORDER — ALBUTEROL SULFATE (2.5 MG/3ML) 0.083% IN NEBU
2.5000 mg | INHALATION_SOLUTION | Freq: Four times a day (QID) | RESPIRATORY_TRACT | Status: DC | PRN
  Administered 2016-11-01: 2.5 mg via RESPIRATORY_TRACT
  Filled 2016-10-31: qty 3

## 2016-10-31 MED ORDER — IOPAMIDOL (ISOVUE-370) INJECTION 76%
INTRAVENOUS | Status: AC
  Administered 2016-10-31: 1 mL
  Filled 2016-10-31: qty 100

## 2016-10-31 MED ORDER — SODIUM CHLORIDE 0.9 % IV BOLUS (SEPSIS)
500.0000 mL | Freq: Once | INTRAVENOUS | Status: AC
Start: 2016-10-31 — End: 2016-10-31
  Administered 2016-10-31: 500 mL via INTRAVENOUS

## 2016-10-31 MED ORDER — LEVOFLOXACIN IN D5W 750 MG/150ML IV SOLN
750.0000 mg | Freq: Once | INTRAVENOUS | Status: AC
  Administered 2016-10-31: 750 mg via INTRAVENOUS
  Filled 2016-10-31: qty 150

## 2016-10-31 MED ORDER — POTASSIUM CHLORIDE CRYS ER 20 MEQ PO TBCR
40.0000 meq | EXTENDED_RELEASE_TABLET | ORAL | Status: AC
  Administered 2016-10-31 – 2016-11-01 (×2): 40 meq via ORAL
  Filled 2016-10-31 (×2): qty 2

## 2016-10-31 MED ORDER — IPRATROPIUM BROMIDE 0.02 % IN SOLN: 0.5000 mg | Freq: Four times a day (QID) | RESPIRATORY_TRACT | Status: DC

## 2016-10-31 MED ORDER — IPRATROPIUM-ALBUTEROL 0.5-2.5 (3) MG/3ML IN SOLN
3.0000 mL | Freq: Four times a day (QID) | RESPIRATORY_TRACT | Status: DC
  Administered 2016-10-31: 3 mL via RESPIRATORY_TRACT
  Filled 2016-10-31: qty 3

## 2016-10-31 MED ORDER — SODIUM CHLORIDE 0.9 % IV SOLN: INTRAVENOUS | Status: DC

## 2016-10-31 MED ORDER — SODIUM CHLORIDE 0.9 % IV SOLN
INTRAVENOUS | Status: DC
  Administered 2016-10-31: 1000 mL via INTRAVENOUS

## 2016-10-31 MED ORDER — IRBESARTAN 150 MG PO TABS
150.0000 mg | ORAL_TABLET | Freq: Every day | ORAL | Status: DC
  Administered 2016-11-01 – 2016-11-03 (×3): 150 mg via ORAL
  Filled 2016-10-31 (×3): qty 1

## 2016-10-31 MED ORDER — ONDANSETRON HCL 4 MG PO TABS: 4.0000 mg | ORAL_TABLET | Freq: Four times a day (QID) | ORAL | Status: DC | PRN

## 2016-10-31 MED ORDER — ALBUTEROL SULFATE (2.5 MG/3ML) 0.083% IN NEBU
2.5000 mg | INHALATION_SOLUTION | RESPIRATORY_TRACT | Status: DC | PRN
  Administered 2016-10-31: 2.5 mg via RESPIRATORY_TRACT

## 2016-10-31 MED ORDER — LEVOFLOXACIN IN D5W 750 MG/150ML IV SOLN
750.0000 mg | INTRAVENOUS | Status: DC
  Administered 2016-11-01 – 2016-11-02 (×2): 750 mg via INTRAVENOUS
  Filled 2016-10-31 (×3): qty 150

## 2016-10-31 MED ORDER — ALBUTEROL SULFATE (2.5 MG/3ML) 0.083% IN NEBU: 2.5000 mg | INHALATION_SOLUTION | Freq: Four times a day (QID) | RESPIRATORY_TRACT | Status: DC

## 2016-10-31 NOTE — ED Triage Notes (Signed)
Patient has had chills, productive cough yellow and thick, and SOB history of COPD and Asthma. Given 10 of Albuterol and 4 of Atrovent 125 of Soulmetrol.

## 2016-10-31 NOTE — H&P (Signed)
TRH H&P    Patient Demographics:    Maxime Neef, is a 50 y.o. female  MRN: [id number]  DOB - [birth year]  Admit Date - 10/31/2016  Referring MD/NP/PA: Dr Eulis Foster  Outpatient Primary MD for the patient is Darci Needle, MD  Patient coming from: Home  No chief complaint on file.     HPI:    Samanvitha Dorko  is a 50 y.o. 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 Patient also has been complaining of increasing tiredness and decreased appetite for several days. There is no history of nausea vomiting or diarrhea. She denies chest pain. Patient does smoke cigarettes, and occasional marijuana.   In the ED chest x-ray showed right lower lobe pneumonia and patient started on Levaquin per pharmacy consultation. Also had elevated lactate 3.87, repeat lactate 3.97. She was started on sepsis protocol, was given IV fluids 30 mL per KG but blood pressure has been stable.    Review of systems:    In addition to the HPI above,  No Fever-chills, No Headache, No changes with Vision or hearing, No problems swallowing food or Liquids, No Abdominal pain, No Nausea or Vomiting, bowel movements are regular, No Blood in stool or Urine, No dysuria, No new skin rashes or bruises, No new joints pains-aches,  No new weakness, tingling, numbness in any extremity, No recent weight gain or loss, No polyuria, polydypsia or polyphagia, No significant Mental Stressors.  A full 10 point Review of Systems was done, except as stated above, all other Review of Systems were negative.   With Past History of the following :    Past Medical History:  Diagnosis Date  . Anxiety   . Bipolar disorder ([place])   . Hyperlipidemia   . Hypertension   . Lipoma    Abdomen  . LIPOMA 01/20/2008  . Obesity   .  Tobacco abuse       Past Surgical History:  Procedure Laterality Date  . CESAREAN SECTION    . lipoma removed  03/2011  . ORIF ANKLE FRACTURE  03/13/2012   Procedure: OPEN REDUCTION INTERNAL FIXATION (ORIF) ANKLE FRACTURE;  Surgeon: Jessy Oto, MD;  Location: [facility];  Service: Orthopedics;  Laterality: Right;      Social History:      Social History  Substance Use Topics  . Smoking status: Light Tobacco Smoker    Packs/day: 0.30    Years: 30.00    Types: Cigarettes    Start date: 11/04/1981  . Smokeless tobacco: Never Used     Comment: in process of quitting on Chantix  . Alcohol use 0.0 oz/week     Comment: 2 beer / week       Family History :     Family History  Problem Relation Age of Onset  . Asthma Father       Home Medications:   Prior to Admission medications   Medication Sig Start Date  End Date Taking? Authorizing Provider  acetaminophen (TYLENOL) 325 MG tablet Take 650 mg by mouth every 6 (six) hours as needed for mild pain.   Yes Historical Provider, MD  albuterol (PROVENTIL HFA;VENTOLIN HFA) 108 (90 Base) MCG/ACT inhaler Inhale 1 puff into the lungs every 6 (six) hours as needed for wheezing or shortness of breath. 09/04/16  Yes Hillary Corinda Gubler, MD  mometasone-formoterol [facility]) 200-5 MCG/ACT AERO Inhale 2 puffs into the lungs 2 (two) times daily. 09/12/16  Yes Hillary Corinda Gubler, MD  olmesartan-hydrochlorothiazide (BENICAR HCT) 20-12.5 MG tablet Take 1 tablet by mouth daily. 09/04/16  Yes Hillary Corinda Gubler, MD  rivaroxaban (XARELTO) 20 MG TABS tablet Take 1 tablet (20 mg total) by mouth daily with supper. 10/03/16  Yes Hillary Corinda Gubler, MD  rivaroxaban (XARELTO) 20 MG TABS tablet Take 1 tablet (20 mg total) by mouth daily with supper. Patient taking differently: Take 20 mg by mouth daily.  10/03/16  Yes Hillary Corinda Gubler, MD  lisinopril-hydrochlorothiazide (PRINZIDE,ZESTORETIC) 20-25 MG tablet Take 1 tablet by mouth  daily. Patient not taking: Reported on 10/31/2016 09/23/16   Rogue Bussing, MD     Allergies:    No Known Allergies   Physical Exam:   Vitals  Blood pressure 152/69, pulse 86, temperature 98.8 F (37.1 C), temperature source Oral, resp. rate 20, height 5\' 4"  (1.626 m), weight 131.1 kg (289 lb), last menstrual period 10/24/2016, SpO2 96 %.  1.  General: Morbidly obese female in mild respiratory distress*  2. Psychiatric:  Intact judgement and  insight, awake alert, oriented x 3.  3. Neurologic: No focal neurological deficits, all cranial nerves intact.Strength 5/5 all 4 extremities, sensation intact all 4 extremities, plantars down going.  4. Eyes :  anicteric sclerae, moist conjunctivae with no lid lag. PERRLA.  5. ENMT:  Oropharynx clear with moist mucous membranes and good dentition  6. Neck:  supple, no cervical lymphadenopathy appriciated, No thyromegaly  7. Respiratory : Normal respiratory effort, scattered wheezing bilaterally  8. Cardiovascular : RRR, no gallops, rubs or murmurs, no leg edema  9. Gastrointestinal:  Positive bowel sounds, abdomen soft, non-tender to palpation,no hepatosplenomegaly, no rigidity or guarding       10. Skin:  No cyanosis, normal texture and turgor, no rash, lesions or ulcers  11.Musculoskeletal:  Good muscle tone,  joints appear normal , no effusions,  normal range of motion    Data Review:    CBC  Recent Labs Lab 10/31/16 1122  WBC 5.8  HGB 10.8*  HCT 34.2*  PLT 320  MCV 71.7*  MCH 22.6*  MCHC 31.6  RDW 17.3*  LYMPHSABS 0.8  MONOABS 0.2  EOSABS 0.0  BASOSABS 0.0   ------------------------------------------------------------------------------------------------------------------  Chemistries   Recent Labs Lab 10/31/16 1122  NA 135  K 3.0*  CL 104  CO2 23  GLUCOSE 153*  BUN 6  CREATININE 0.66  CALCIUM 8.2*    ------------------------------------------------------------------------------------------------------------------  ---------------------------------------------------------------Cardiac Enzymes:  Recent Labs Lab 10/31/16 1122  TROPONINI <0.03    --------------------------------------------------------------------------------------------------------------- Urine analysis:    Component Value Date/Time   COLORURINE YELLOW 10/31/2016 [place] 10/31/2016 1524   LABSPEC 1.005 10/31/2016 1524   PHURINE 6.0 10/31/2016 1524   GLUCOSEU 150 (A) 10/31/2016 1524   HGBUR NEGATIVE 10/31/2016 [place] 10/31/2016 1524   BILIRUBINUR NEG 12/29/2013 0900   KETONESUR NEGATIVE 10/31/2016 1524   PROTEINUR NEGATIVE 10/31/2016 1524   UROBILINOGEN 0.2 12/29/2013 0900   UROBILINOGEN 0.2 03/07/2011 1430  NITRITE NEGATIVE 10/31/2016 [place] 10/31/2016 1524      Imaging Results:    Dg Chest 2 View  Result Date: 10/31/2016 CLINICAL DATA:  Cough and congestion, short of breath EXAM: CHEST  2 VIEW COMPARISON:  03/31/2016 FINDINGS: Normal cardiac silhouette. There is airspace disease in the RIGHT lower lobe. No pneumothorax. Potential small RIGHT effusion. IMPRESSION: RIGHT lower lobe pneumonia. Electronically Signed   By: Suzy Bouchard M.D.   On: 10/31/2016 11:18      Assessment & Plan:    Active Problems:   History of pulmonary embolism   Left leg DVT (HCC)   Chronic obstructive pulmonary disease (COPD) ([place])   CAP (community acquired pneumonia)   Dyspnea   1. Right lower lobe pneumonia- we'll obtain strep pneumo urinary antigen, follow blood culture results. We'll start Levaquin per pharmacy consultation. Since patient's blood pressure has been stable throughout her ED stay, we'll not continue aggressive IV hydration, will repeat lactic acid. 2. Hypokalemia-replace potassium and check BMP in a.m. 3. Mild COPD  exacerbation/bronchitis- given Solu-Medrol in the ED, will start Solu-Medrol 60 mg IV every 6 hours, DuoNeb nebs every 6 hours, albuterol nebulizer every 2 hours when necessary. 4. History of venous thrombus embolism- patient has history of left leg DVT with pulmonary embolism and did not take result to for  a week when she was having periods. Will repeat CT angio chest to rule out recurrent PE. Continue Xarelto 5. Hypertension- blood pressure stable, continue Benicar HCT   DVT Prophylaxis-  patient on Xarelto*  AM Labs Ordered, also please review Full Orders  Family Communication: No family at bedside  Code Status:  Full code  Admission status: Observation   Time spent in minutes : 60 minutes  LAMA,GAGAN S M.D on 10/31/2016 at 5:56 PM  Between 7am to 7pm - Pager - [phone]. After 7pm go to [email or web address] - password [facility]  [facility] - Office  [phone]

## 2016-10-31 NOTE — Progress Notes (Signed)
Pharmacy Antibiotic Follow-up Note  Carla Hanson is a 50 y.o. year-old female admitted on 10/31/2016.  The patient is currently on day 1 of Levaquin for CAP.  Assessment/Plan: This patient's current antibiotics will be continued without adjustments.  Pharmacy will sign off protocol. Renal function wnl  Temp (24hrs), Avg:99 F (37.2 C), Min:98.8 F (37.1 C), Max:99.2 F (37.3 C)   Recent Labs Lab 10/31/16 1122  WBC 5.8    Recent Labs Lab 10/31/16 1122  CREATININE 0.66   Estimated Creatinine Clearance: 113.3 mL/min (by C-G formula based on SCr of 0.66 mg/dL).    No Known Allergies  Antimicrobials this admission: 12/28 Levaquin >>   Microbiology results: 12/28 BCx: sent 12/28 UCx: sent   Thank you for allowing pharmacy to be a part of this patient's care.  Minda Ditto PharmD 10/31/2016 8:36 PM

## 2016-10-31 NOTE — ED Notes (Signed)
Pt alert, NAD, calm, interactive, resps e/u, mild increased wob noted, speaking in clear complete sentences, speaking in clear complete sentences, skin W&D, VSS, denies pain, c/o sob. Report called to floor by previous RN, RLW.

## 2016-10-31 NOTE — ED Provider Notes (Signed)
[facility] DEPT Provider Note   CSN: [id number] Arrival date & time: 10/31/16  [id number]     History   Chief Complaint No chief complaint on file.   HPI Carla Hanson is a 50 y.o. female.  She presents for evaluation of 4 day history of cough, productive of white sputum, unrelieved by home medication, albuterol. Also complains of general achiness, tiredness and decreased appetite for several days. There's been no nausea, vomiting, focal weakness, paresthesia, or dizziness. No other recent similar problems. There are no other known modifying factors.  HPI  Past Medical History:  Diagnosis Date  . Anxiety   . Bipolar disorder ([place])   . Hyperlipidemia   . Hypertension   . Lipoma    Abdomen  . LIPOMA 01/20/2008  . Obesity   . Tobacco abuse     Patient Active Problem List   Diagnosis Date Noted  . Encounter for weight loss counseling 06/09/2016  . Tobacco abuse 05/10/2016  . Chronic obstructive pulmonary disease (COPD) ([place]) 05/10/2016  . RUQ abdominal pain 12/29/2013  . Left leg DVT ([place]) 09/29/2013  . Pre-diabetes 03/02/2013  . History of pulmonary embolism 05/05/2012  . Painful menstrual periods 01/05/2012  . Bipolar disorder ([place]) 07/03/2010  . History of tobacco abuse 08/23/2009  . Hyperlipidemia 04/30/2007  . OBESITY, NOS 01/01/2007  . HYPERTENSION, BENIGN SYSTEMIC 01/01/2007    Past Surgical History:  Procedure Laterality Date  . CESAREAN SECTION    . lipoma removed  03/2011  . ORIF ANKLE FRACTURE  03/13/2012   Procedure: OPEN REDUCTION INTERNAL FIXATION (ORIF) ANKLE FRACTURE;  Surgeon: Jessy Oto, MD;  Location: [facility];  Service: Orthopedics;  Laterality: Right;    OB History    No data available       Home Medications    Prior to Admission medications   Medication Sig Start Date End Date Taking? Authorizing Provider  acetaminophen (TYLENOL) 325 MG tablet Take 650 mg by mouth every 6 (six) hours as needed for mild pain.   Yes Historical  Provider, MD  albuterol (PROVENTIL HFA;VENTOLIN HFA) 108 (90 Base) MCG/ACT inhaler Inhale 1 puff into the lungs every 6 (six) hours as needed for wheezing or shortness of breath. 09/04/16  Yes Hillary Corinda Gubler, MD  mometasone-formoterol [facility]) 200-5 MCG/ACT AERO Inhale 2 puffs into the lungs 2 (two) times daily. 09/12/16  Yes Hillary Corinda Gubler, MD  olmesartan-hydrochlorothiazide (BENICAR HCT) 20-12.5 MG tablet Take 1 tablet by mouth daily. 09/04/16  Yes Hillary Corinda Gubler, MD  rivaroxaban (XARELTO) 20 MG TABS tablet Take 1 tablet (20 mg total) by mouth daily with supper. 10/03/16  Yes Hillary Corinda Gubler, MD  rivaroxaban (XARELTO) 20 MG TABS tablet Take 1 tablet (20 mg total) by mouth daily with supper. Patient taking differently: Take 20 mg by mouth daily.  10/03/16  Yes Hillary Corinda Gubler, MD  lisinopril-hydrochlorothiazide (PRINZIDE,ZESTORETIC) 20-25 MG tablet Take 1 tablet by mouth daily. Patient not taking: Reported on 10/31/2016 09/23/16   Rogue Bussing, MD    Family History Family History  Problem Relation Age of Onset  . Asthma Father     Social History Social History  Substance Use Topics  . Smoking status: Light Tobacco Smoker    Packs/day: 0.30    Years: 30.00    Types: Cigarettes    Start date: 11/04/1981  . Smokeless tobacco: Never Used     Comment: in process of quitting on Chantix  . Alcohol use 0.0 oz/week     Comment:  2 beer / week     Allergies   Patient has no known allergies.   Review of Systems Review of Systems  All other systems reviewed and are negative.    Physical Exam Updated Vital Signs BP 152/69 (BP Location: Left Arm)   Pulse 86   Temp 98.8 F (37.1 C) (Oral)   Resp 20   Ht 5\' 4"  (1.626 m)   Wt 289 lb (131.1 kg)   LMP 10/24/2016   SpO2 96%   BMI 49.61 kg/m   Physical Exam  Constitutional: She is oriented to person, place, and time. She appears well-developed. No distress.  Overweight, appears  older than stated age.  HENT:  Head: Normocephalic and atraumatic.  Eyes: Conjunctivae and EOM are normal. Pupils are equal, round, and reactive to light.  Neck: Normal range of motion and phonation normal. Neck supple.  Cardiovascular: Regular rhythm.   Tachycardic  Pulmonary/Chest: Effort normal. No respiratory distress. She has no rales. She exhibits no tenderness.  Somewhat decreased air movement bilaterally with scattered wheezes and rhonchi.  Abdominal: Soft. She exhibits no distension. There is no tenderness. There is no guarding.  Musculoskeletal: Normal range of motion. She exhibits no edema or deformity.  Neurological: She is alert and oriented to person, place, and time. She exhibits normal muscle tone.  Skin: Skin is warm and dry.  Psychiatric: She has a normal mood and affect. Her behavior is normal. Judgment and thought content normal.  Nursing note and vitals reviewed.    ED Treatments / Results  Labs (all labs ordered are listed, but only abnormal results are displayed) Labs Reviewed  BASIC METABOLIC PANEL - Abnormal; Notable for the following:       Result Value   Potassium 3.0 (*)    Glucose, Bld 153 (*)    Calcium 8.2 (*)    All other components within normal limits  CBC WITH DIFFERENTIAL/PLATELET - Abnormal; Notable for the following:    Hemoglobin 10.8 (*)    HCT 34.2 (*)    MCV 71.7 (*)    MCH 22.6 (*)    RDW 17.3 (*)    All other components within normal limits  URINALYSIS, ROUTINE W REFLEX MICROSCOPIC - Abnormal; Notable for the following:    Glucose, UA 150 (*)    All other components within normal limits  I-STAT CG4 LACTIC ACID, ED - Abnormal; Notable for the following:    Lactic Acid, Venous 3.87 (*)    All other components within normal limits  I-STAT CG4 LACTIC ACID, ED - Abnormal; Notable for the following:    Lactic Acid, Venous 3.97 (*)    All other components within normal limits  CULTURE, BLOOD (ROUTINE X 2)  CULTURE, BLOOD (ROUTINE X 2)    URINE CULTURE  TROPONIN I    EKG  EKG Interpretation None       Radiology Dg Chest 2 View  Result Date: 10/31/2016 CLINICAL DATA:  Cough and congestion, short of breath EXAM: CHEST  2 VIEW COMPARISON:  03/31/2016 FINDINGS: Normal cardiac silhouette. There is airspace disease in the RIGHT lower lobe. No pneumothorax. Potential small RIGHT effusion. IMPRESSION: RIGHT lower lobe pneumonia. Electronically Signed   By: Suzy Bouchard M.D.   On: 10/31/2016 11:18    Procedures Procedures (including critical care time)  Medications Ordered in ED Medications  0.9 %  sodium chloride infusion ( Intravenous Stopped 10/31/16 1611)  sodium chloride 0.9 % bolus 1,000 mL (0 mLs Intravenous Stopped 10/31/16 1715)  And  sodium chloride 0.9 % bolus 1,000 mL (1,000 mLs Intravenous New Bag/Given 10/31/16 1716)    And  sodium chloride 0.9 % bolus 1,000 mL (1,000 mLs Intravenous New Bag/Given 10/31/16 1716)    And  sodium chloride 0.9 % bolus 1,000 mL (not administered)  sodium chloride 0.9 % bolus 500 mL (0 mLs Intravenous Stopped 10/31/16 1314)  levofloxacin (LEVAQUIN) IVPB 750 mg (0 mg Intravenous Stopped 10/31/16 1611)     Initial Impression / Assessment and Plan / ED Course  I have reviewed the triage vital signs and the nursing notes.  Pertinent labs & imaging results that were available during my care of the patient were reviewed by me and considered in my medical decision making (see chart for details).  Clinical Course as of Oct 31 1724  Thu Oct 31, 2016  1256 She states that she is more comfortable at this time. She has been able to ambulate to the bathroom without respiratory distress. Currently, oxygen saturation is 98% on room air.  [EW]  1721 RLL PNeumonia DG Chest 2 View [EW]    Clinical Course User Index [EW] Daleen Bo, MD    Medications  0.9 %  sodium chloride infusion ( Intravenous Stopped 10/31/16 1611)  sodium chloride 0.9 % bolus 1,000 mL (0 mLs Intravenous  Stopped 10/31/16 1715)    And  sodium chloride 0.9 % bolus 1,000 mL (1,000 mLs Intravenous New Bag/Given 10/31/16 1716)    And  sodium chloride 0.9 % bolus 1,000 mL (1,000 mLs Intravenous New Bag/Given 10/31/16 1716)    And  sodium chloride 0.9 % bolus 1,000 mL (not administered)  sodium chloride 0.9 % bolus 500 mL (0 mLs Intravenous Stopped 10/31/16 1314)  levofloxacin (LEVAQUIN) IVPB 750 mg (0 mg Intravenous Stopped 10/31/16 1611)    Patient Vitals for the past 24 hrs:  BP Temp Temp src Pulse Resp SpO2 Height Weight  10/31/16 1633 152/69 98.8 F (37.1 C) Oral 86 20 96 % - -  10/31/16 1258 136/75 99.2 F (37.3 C) - 94 20 97 % - -  10/31/16 1009 125/88 98.9 F (37.2 C) Oral 102 22 90 % - -  10/31/16 0957 146/88 - - 80 18 98 % - -  10/31/16 0954 - - - - - - 5\' 4"  (1.626 m) 289 lb (131.1 kg)    5:24 PM Reevaluation with update and discussion. After initial assessment and treatment, an updated evaluation reveals No additional complaints. Findings discussed with the patient, and all questions were entered. Eymi Lipuma L   5:24 PM-Consult complete with hospitalist. Patient case explained and discussed. He agrees to admit patient for further evaluation and treatment. Call ended at 17:30   Final Clinical Impressions(s) / ED Diagnoses   Final diagnoses:  None    New Prescriptions New Prescriptions   No medications on file     Daleen Bo, MD 10/31/16 1732

## 2016-10-31 NOTE — ED Notes (Signed)
Bed: WA08 Expected date:  Expected time:  Means of arrival:  Comments: 

## 2016-11-01 ENCOUNTER — Observation Stay (HOSPITAL_BASED_OUTPATIENT_CLINIC_OR_DEPARTMENT_OTHER): Payer: Self-pay

## 2016-11-01 DIAGNOSIS — I509 Heart failure, unspecified: Secondary | ICD-10-CM

## 2016-11-01 LAB — COMPREHENSIVE METABOLIC PANEL
ALBUMIN: 2.9 g/dL — AB (ref 3.5–5.0)
ALT: 15 U/L (ref 14–54)
AST: 28 U/L (ref 15–41)
Alkaline Phosphatase: 36 U/L — ABNORMAL LOW (ref 38–126)
Anion gap: 7 (ref 5–15)
BUN: 6 mg/dL (ref 6–20)
CALCIUM: 7.7 mg/dL — AB (ref 8.9–10.3)
CO2: 20 mmol/L — ABNORMAL LOW (ref 22–32)
Chloride: 113 mmol/L — ABNORMAL HIGH (ref 101–111)
Creatinine, Ser: 0.53 mg/dL (ref 0.44–1.00)
GFR calc non Af Amer: >60 mL/min (ref >60)
Glucose, Bld: 135 mg/dL — ABNORMAL HIGH (ref 65–99)
POTASSIUM: 4.3 mmol/L (ref 3.5–5.1)
Sodium: 140 mmol/L (ref 135–145)
TOTAL PROTEIN: 6.5 g/dL (ref 6.5–8.1)
Total Bilirubin: 0.7 mg/dL (ref 0.3–1.2)

## 2016-11-01 LAB — URINE CULTURE

## 2016-11-01 LAB — BRAIN NATRIURETIC PEPTIDE: B Natriuretic Peptide: 50 pg/mL (ref 0.0–100.0)

## 2016-11-01 LAB — CBC
HCT: 33.9 % — ABNORMAL LOW (ref 36.0–46.0)
Hemoglobin: 10.5 g/dL — ABNORMAL LOW (ref 12.0–15.0)
MCH: 22.9 pg — ABNORMAL LOW (ref 26.0–34.0)
MCHC: 31 g/dL (ref 30.0–36.0)
MCV: 73.9 fL — AB (ref 78.0–100.0)
PLATELETS: 277 10*3/uL (ref 150–400)
RBC: 4.59 MIL/uL (ref 3.87–5.11)
RDW: 17.7 % — ABNORMAL HIGH (ref 11.5–15.5)
WBC: 6.1 10*3/uL (ref 4.0–10.5)

## 2016-11-01 LAB — ECHOCARDIOGRAM COMPLETE
HEIGHTINCHES: 63 in
Weight: 4624 oz

## 2016-11-01 MED ORDER — IPRATROPIUM-ALBUTEROL 0.5-2.5 (3) MG/3ML IN SOLN
3.0000 mL | RESPIRATORY_TRACT | Status: DC
  Administered 2016-11-01 (×4): 3 mL via RESPIRATORY_TRACT
  Filled 2016-11-01 (×4): qty 3

## 2016-11-01 MED ORDER — METHYLPREDNISOLONE SODIUM SUCC 125 MG IJ SOLR
60.0000 mg | Freq: Four times a day (QID) | INTRAMUSCULAR | Status: DC
  Administered 2016-11-01: 60 mg via INTRAVENOUS
  Administered 2016-11-01: 125 mg via INTRAVENOUS
  Administered 2016-11-01 – 2016-11-03 (×7): 60 mg via INTRAVENOUS
  Filled 2016-11-01 (×9): qty 2

## 2016-11-01 NOTE — Progress Notes (Signed)
  Echocardiogram 2D Echocardiogram has been performed.  Carla Hanson M 11/01/2016, 2:01 PM

## 2016-11-01 NOTE — Progress Notes (Signed)
[facility]  PROGRESS NOTE  Carla Hanson [id number] DOB: April 08, 1966 DOA: 10/31/2016 PCP: Darci Needle, MD   Brief HPI:   50 y.o. 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 Patient also has been complaining of increasing tiredness and decreased appetite for several days. There is no history of nausea vomiting or diarrhea. She denies chest pain. Patient does smoke cigarettes, and occasional marijuana.   In the ED chest x-ray showed right lower lobe pneumonia and patient started on Levaquin per pharmacy consultation. Also had elevated lactate 3.87, repeat lactate 3.97. She was started on sepsis protocol, was given IV fluids 30 mL per KG but blood pressure has been stable.    Subjective   Patient complains of shortness of breath this morning. CT angiogram was negative for pulmonary embolism   Assessment/Plan:     1. Right lower lobe pneumonia- Patient started on IV Levaquin, urinary strep pneumo antigen negative, lactic acid improved to 1.4, blood cultures 2 negative to date 2. Hypokalemia-potassium replaced. 3. COPD exacerbation/bronchitis-  start Solu-Medrol 60 mg IV every 6 hours, DuoNeb nebs every 6 hours, albuterol nebulizer every 2 hours when necessary. 4. History of venous thrombus embolism- patient has history of left leg DVT with pulmonary embolism and did not take result to for  a week when she was having periods. Will repeat CT angio chest to rule out recurrent PE. Continue Xarelto 5. Hypertension- blood pressure stable, continue Benicar HCT    DVT prophylaxis: Xarelto  Code Status: Full code  Family Communication: No family at bedside   Disposition Plan: Home when medically stable   Consultants:  None  Procedures:  None  Continuous infusions . sodium chloride Stopped (11/01/16 0900)      Antibiotics:    Anti-infectives    Start     Dose/Rate Route Frequency Ordered Stop   11/01/16 1400  levofloxacin (LEVAQUIN) IVPB 750 mg     750 mg 100 mL/hr over 90 Minutes Intravenous Every 24 hours 10/31/16 2040     10/31/16 1300  levofloxacin (LEVAQUIN) IVPB 750 mg     750 mg 100 mL/hr over 90 Minutes Intravenous  Once 10/31/16 1255 10/31/16 1611       Objective   Vitals:   11/01/16 0607 11/01/16 0851 11/01/16 1251 11/01/16 1420  BP: (!) 171/82   (!) 145/88  Pulse: 91   84  Resp: (!) 21 (!) 28  18  Temp: 98.7 F (37.1 C)   98.1 F (36.7 C)  TempSrc: Oral   Oral  SpO2: 94% 96% 98% 98%  Weight:      Height:        Intake/Output Summary (Last 24 hours) at 11/01/16 1606 Last data filed at 11/01/16 1147  Gross per 24 hour  Intake          4931.67 ml  Output                0 ml  Net          4931.67 ml   Filed Weights   10/31/16 0954  Weight: 131.1 kg (289 lb)     Physical Examination:  General exam: Appears calm and comfortable. Respiratory system: Bilateral wheezing Cardiovascular system:  RRR. No  murmurs, rubs, gallops. No pedal edema. GI system: Abdomen is nondistended, soft and nontender. No organomegaly.  Central nervous system. No focal neurological deficits. 5 x  5 power in all extremities. Skin: No rashes, lesions or ulcers. Psychiatry: Alert, oriented x 3.Judgement and insight appear normal. Affect normal.    Data Reviewed: I have personally reviewed following labs and imaging studies  CBG: No results for input(s): GLUCAP in the last 168 hours.  CBC:  Recent Labs Lab 10/31/16 1122 11/01/16 0535  WBC 5.8 6.1  NEUTROABS 4.8  --   HGB 10.8* 10.5*  HCT 34.2* 33.9*  MCV 71.7* 73.9*  PLT 320 99991111    Basic Metabolic Panel:  Recent Labs Lab 10/31/16 1122 11/01/16 0535  NA 135 140  K 3.0* 4.3  CL 104 113*  CO2 23 20*  GLUCOSE 153* 135*  BUN 6 6  CREATININE 0.66 0.53  CALCIUM 8.2* 7.7*    Recent Results (from the past 240 hour(s))  Culture,  blood (routine x 2)     Status: None (Preliminary result)   Collection Time: 10/31/16  1:31 PM  Result Value Ref Range Status   Specimen Description BLOOD RIGHT WRIST  Final   Special Requests BOTTLES DRAWN AEROBIC AND ANAEROBIC 5CC  Final   Culture   Final    NO GROWTH < 24 HOURS Performed at [facility]    Report Status PENDING  Incomplete  Culture, blood (routine x 2)     Status: None (Preliminary result)   Collection Time: 10/31/16  1:31 PM  Result Value Ref Range Status   Specimen Description BLOOD LEFT WRIST  Final   Special Requests BOTTLES DRAWN AEROBIC AND ANAEROBIC 5CC  Final   Culture   Final    NO GROWTH < 24 HOURS Performed at [facility]    Report Status PENDING  Incomplete  Urine culture     Status: Abnormal   Collection Time: 10/31/16  3:24 PM  Result Value Ref Range Status   Specimen Description URINE, CLEAN CATCH  Final   Special Requests NONE  Final   Culture MULTIPLE SPECIES PRESENT, SUGGEST RECOLLECTION (A)  Final   Report Status 11/01/2016 FINAL  Final     Liver Function Tests:  Recent Labs Lab 11/01/16 0535  AST 28  ALT 15  ALKPHOS 36*  BILITOT 0.7  PROT 6.5  ALBUMIN 2.9*   No results for input(s): LIPASE, AMYLASE in the last 168 hours. No results for input(s): AMMONIA in the last 168 hours.  Cardiac Enzymes:  Recent Labs Lab 10/31/16 1122  TROPONINI <0.03   BNP (last 3 results)  Recent Labs  11/01/16 0535  BNP 50.0      Studies: Dg Chest 2 View  Result Date: 10/31/2016 CLINICAL DATA:  Cough and congestion, short of breath EXAM: CHEST  2 VIEW COMPARISON:  03/31/2016 FINDINGS: Normal cardiac silhouette. There is airspace disease in the RIGHT lower lobe. No pneumothorax. Potential small RIGHT effusion. IMPRESSION: RIGHT lower lobe pneumonia. Electronically Signed   By: Suzy Bouchard M.D.   On: 10/31/2016 11:18   Ct Angio Chest Pe W Or Wo Contrast  Result Date: 10/31/2016 CLINICAL DATA:  Four-day history of  cough. Also generalized achiness and fatigue. Decreased appetite. EXAM: CT ANGIOGRAPHY CHEST WITH CONTRAST TECHNIQUE: Multidetector CT imaging of the chest was performed using the standard protocol during bolus administration of intravenous contrast. Multiplanar CT image reconstructions and MIPs were obtained to evaluate the vascular anatomy. CONTRAST:  100 mL of Isovue 370 intravenous contrast COMPARISON:  Current chest radiograph.  Chest CT, 05/04/2012. FINDINGS: Cardiovascular: There is satisfactory opacification of the pulmonary arteries. Study is mildly limited  due to respiratory motion, which limits assessment of the smaller segmental and subsegmental vessels most evident in the lower lobes. Allowing for this, there is no evidence of a pulmonary embolus. The heart is normal in size and configuration. The great vessels normal in caliber. No significant aortic plaque. No dissection. Mediastinum/Nodes: There are prominent mediastinal lymph nodes, none pathologically enlarged by size criteria. There is a mildly enlarged right suprahilar lymph node measuring 15 mm in short axis. No mediastinal or hilar masses. No neck base or axillary masses or adenopathy. Lungs/Pleura: There multiple ill-defined nodular opacities throughout the right lung with more confluent opacity in the right lower lobe. There are few similar-appearing airspace opacities in the left upper lobe with 1 small focus in the left lower lobe. There is no pleural effusion. No pneumothorax. Upper Abdomen: No acute abnormality. Musculoskeletal: No chest wall abnormality. No acute or significant osseous findings. Review of the MIP images confirms the above findings. IMPRESSION: 1. No evidence a pulmonary embolus. This study mildly limited due to respiratory motion. 2. Multifocal pneumonia most apparent throughout the right lung. Electronically Signed   By: Lajean Manes M.D.   On: 10/31/2016 18:37    Scheduled Meds: . irbesartan  150 mg Oral Daily    And  . hydrochlorothiazide  12.5 mg Oral Daily  . ipratropium-albuterol  3 mL Nebulization Q4H  . levofloxacin (LEVAQUIN) IV  750 mg Intravenous Q24H  . methylPREDNISolone (SOLU-MEDROL) injection  60 mg Intravenous Q6H  . rivaroxaban  20 mg Oral Q supper      Time spent: 25 min  [facility] Pager [phone]. If 7PM-7AM, please contact night-coverage at [email or web address], Office  [phone]  password [email or web address] 11/01/2016, 4:06 PM  LOS: 1 day

## 2016-11-02 DIAGNOSIS — I82432 Acute embolism and thrombosis of left popliteal vein: Secondary | ICD-10-CM

## 2016-11-02 LAB — BASIC METABOLIC PANEL
ANION GAP: 7 (ref 5–15)
BUN: 9 mg/dL (ref 6–20)
CALCIUM: 8.5 mg/dL — AB (ref 8.9–10.3)
CO2: 25 mmol/L (ref 22–32)
Chloride: 105 mmol/L (ref 101–111)
Creatinine, Ser: 0.51 mg/dL (ref 0.44–1.00)
GFR calc Af Amer: >60 mL/min (ref >60)
Glucose, Bld: 168 mg/dL — ABNORMAL HIGH (ref 65–99)
Potassium: 3.8 mmol/L (ref 3.5–5.1)
SODIUM: 137 mmol/L (ref 135–145)

## 2016-11-02 MED ORDER — IPRATROPIUM BROMIDE 0.02 % IN SOLN
0.5000 mg | Freq: Four times a day (QID) | RESPIRATORY_TRACT | Status: DC
  Administered 2016-11-02 – 2016-11-03 (×4): 0.5 mg via RESPIRATORY_TRACT
  Filled 2016-11-02 (×4): qty 2.5

## 2016-11-02 MED ORDER — IPRATROPIUM-ALBUTEROL 0.5-2.5 (3) MG/3ML IN SOLN
3.0000 mL | Freq: Four times a day (QID) | RESPIRATORY_TRACT | Status: DC
  Administered 2016-11-02: 3 mL via RESPIRATORY_TRACT
  Filled 2016-11-02: qty 3

## 2016-11-02 MED ORDER — LEVALBUTEROL HCL 0.63 MG/3ML IN NEBU
0.6300 mg | INHALATION_SOLUTION | Freq: Four times a day (QID) | RESPIRATORY_TRACT | Status: DC
  Administered 2016-11-02 – 2016-11-03 (×4): 0.63 mg via RESPIRATORY_TRACT
  Filled 2016-11-02 (×4): qty 3

## 2016-11-02 MED ORDER — ALBUTEROL SULFATE (2.5 MG/3ML) 0.083% IN NEBU: 2.5000 mg | INHALATION_SOLUTION | RESPIRATORY_TRACT | Status: DC | PRN

## 2016-11-02 NOTE — Progress Notes (Signed)
[facility]  PROGRESS NOTE  Carla Hanson [id number] DOB: Jun 14, 1966 DOA: 10/31/2016 PCP: Darci Needle, MD   Brief HPI:   50 y.o. 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 Patient also has been complaining of increasing tiredness and decreased appetite for several days. There is no history of nausea vomiting or diarrhea. She denies chest pain. Patient does smoke cigarettes, and occasional marijuana.   In the ED chest x-ray showed right lower lobe pneumonia and patient started on Levaquin per pharmacy consultation. Also had elevated lactate 3.87, repeat lactate 3.97. She was started on sepsis protocol, was given IV fluids 30 mL per KG but blood pressure has been stable.  CT angiogram was negative for pulmonary embolism   Subjective   Patient breathing better this morning.   Assessment/Plan:     1. Right lower lobe pneumonia- Patient started on IV Levaquin, urinary strep pneumo antigen negative, lactic acid improved to 1.4, blood cultures 2 negative to date 2. Hypokalemia-potassium replaced. 3. COPD exacerbation/bronchitis-   Improved after started on  Solu-Medrol 60 mg IV every 6 hours,Atrovent nebulizers every 6 hours. Will switch to Xopenex due to persistent tachycardia and tremors 4. History of venous thrombus embolism- patient has history of left leg DVT with pulmonary embolism and did not take result to for  a week when she was having periods. Will repeat CT angio chest to rule out recurrent PE. Continue Xarelto 5. Hypertension- blood pressure stable, continue Benicar HCT    DVT prophylaxis: Xarelto  Code Status: Full code  Family Communication: No family at bedside   Disposition Plan: Home when medically stable   Consultants:  None  Procedures:  None  Continuous infusions . sodium chloride Stopped (11/01/16 0900)       Antibiotics:   Anti-infectives    Start     Dose/Rate Route Frequency Ordered Stop   11/01/16 1400  levofloxacin (LEVAQUIN) IVPB 750 mg     750 mg 100 mL/hr over 90 Minutes Intravenous Every 24 hours 10/31/16 2040     10/31/16 1300  levofloxacin (LEVAQUIN) IVPB 750 mg     750 mg 100 mL/hr over 90 Minutes Intravenous  Once 10/31/16 1255 10/31/16 1611       Objective   Vitals:   11/02/16 1133 11/02/16 1400 11/02/16 1411 11/02/16 1614  BP:  (!) 138/45    Pulse: 71 95 78   Resp:  18 18   Temp:  97.9 F (36.6 C)    TempSrc:  Oral    SpO2: 96% 95% 96% 99%  Weight:      Height:        Intake/Output Summary (Last 24 hours) at 11/02/16 1649 Last data filed at 11/02/16 1300  Gross per 24 hour  Intake              510 ml  Output                0 ml  Net              510 ml   Filed Weights   10/31/16 0954 11/01/16 1900  Weight: 131.1 kg (289 lb) (!) 142.3 kg (313 lb 11.4 oz)     Physical Examination:  General exam: Appears calm and comfortable. Respiratory system: Good airflow bilaterally, scattered wheezing Cardiovascular system:  RRR. No  murmurs, rubs, gallops. No pedal edema. GI system:  Abdomen is nondistended, soft and nontender. No organomegaly.  Central nervous system. No focal neurological deficits. 5 x 5 power in all extremities. Skin: No rashes, lesions or ulcers. Psychiatry: Alert, oriented x 3.Judgement and insight appear normal. Affect normal.    Data Reviewed: I have personally reviewed following labs and imaging studies  CBG: No results for input(s): GLUCAP in the last 168 hours.  CBC:  Recent Labs Lab 10/31/16 1122 11/01/16 0535  WBC 5.8 6.1  NEUTROABS 4.8  --   HGB 10.8* 10.5*  HCT 34.2* 33.9*  MCV 71.7* 73.9*  PLT 320 99991111    Basic Metabolic Panel:  Recent Labs Lab 10/31/16 1122 11/01/16 0535 11/02/16 0516  NA 135 140 137  K 3.0* 4.3 3.8  CL 104 113* 105  CO2 23 20* 25  GLUCOSE 153* 135* 168*  BUN 6 6 9   CREATININE  0.66 0.53 0.51  CALCIUM 8.2* 7.7* 8.5*    Recent Results (from the past 240 hour(s))  Culture, blood (routine x 2)     Status: None (Preliminary result)   Collection Time: 10/31/16  1:31 PM  Result Value Ref Range Status   Specimen Description BLOOD RIGHT WRIST  Final   Special Requests BOTTLES DRAWN AEROBIC AND ANAEROBIC 5CC  Final   Culture   Final    NO GROWTH 2 DAYS Performed at [facility]    Report Status PENDING  Incomplete  Culture, blood (routine x 2)     Status: None (Preliminary result)   Collection Time: 10/31/16  1:31 PM  Result Value Ref Range Status   Specimen Description BLOOD LEFT WRIST  Final   Special Requests BOTTLES DRAWN AEROBIC AND ANAEROBIC 5CC  Final   Culture   Final    NO GROWTH 2 DAYS Performed at [facility]    Report Status PENDING  Incomplete  Urine culture     Status: Abnormal   Collection Time: 10/31/16  3:24 PM  Result Value Ref Range Status   Specimen Description URINE, CLEAN CATCH  Final   Special Requests NONE  Final   Culture MULTIPLE SPECIES PRESENT, SUGGEST RECOLLECTION (A)  Final   Report Status 11/01/2016 FINAL  Final     Liver Function Tests:  Recent Labs Lab 11/01/16 0535  AST 28  ALT 15  ALKPHOS 36*  BILITOT 0.7  PROT 6.5  ALBUMIN 2.9*   No results for input(s): LIPASE, AMYLASE in the last 168 hours. No results for input(s): AMMONIA in the last 168 hours.  Cardiac Enzymes:  Recent Labs Lab 10/31/16 1122  TROPONINI <0.03   BNP (last 3 results)  Recent Labs  11/01/16 0535  BNP 50.0      Studies: Ct Angio Chest Pe W Or Wo Contrast  Result Date: 10/31/2016 CLINICAL DATA:  Four-day history of cough. Also generalized achiness and fatigue. Decreased appetite. EXAM: CT ANGIOGRAPHY CHEST WITH CONTRAST TECHNIQUE: Multidetector CT imaging of the chest was performed using the standard protocol during bolus administration of intravenous contrast. Multiplanar CT image reconstructions and MIPs were  obtained to evaluate the vascular anatomy. CONTRAST:  100 mL of Isovue 370 intravenous contrast COMPARISON:  Current chest radiograph.  Chest CT, 05/04/2012. FINDINGS: Cardiovascular: There is satisfactory opacification of the pulmonary arteries. Study is mildly limited due to respiratory motion, which limits assessment of the smaller segmental and subsegmental vessels most evident in the lower lobes. Allowing for this, there is no evidence of a pulmonary embolus. The heart is normal in size and configuration.  The great vessels normal in caliber. No significant aortic plaque. No dissection. Mediastinum/Nodes: There are prominent mediastinal lymph nodes, none pathologically enlarged by size criteria. There is a mildly enlarged right suprahilar lymph node measuring 15 mm in short axis. No mediastinal or hilar masses. No neck base or axillary masses or adenopathy. Lungs/Pleura: There multiple ill-defined nodular opacities throughout the right lung with more confluent opacity in the right lower lobe. There are few similar-appearing airspace opacities in the left upper lobe with 1 small focus in the left lower lobe. There is no pleural effusion. No pneumothorax. Upper Abdomen: No acute abnormality. Musculoskeletal: No chest wall abnormality. No acute or significant osseous findings. Review of the MIP images confirms the above findings. IMPRESSION: 1. No evidence a pulmonary embolus. This study mildly limited due to respiratory motion. 2. Multifocal pneumonia most apparent throughout the right lung. Electronically Signed   By: Lajean Manes M.D.   On: 10/31/2016 18:37    Scheduled Meds: . irbesartan  150 mg Oral Daily   And  . hydrochlorothiazide  12.5 mg Oral Daily  . ipratropium  0.5 mg Nebulization Q6H  . levalbuterol  0.63 mg Nebulization Q6H  . levofloxacin (LEVAQUIN) IV  750 mg Intravenous Q24H  . methylPREDNISolone (SOLU-MEDROL) injection  60 mg Intravenous Q6H  . rivaroxaban  20 mg Oral Q supper       Time spent: 25 min  [facility] Pager [phone]. If 7PM-7AM, please contact night-coverage at [email or web address], Office  [phone]  password [email or web address] 11/02/2016, 4:49 PM  LOS: 2 days

## 2016-11-03 MED ORDER — PREDNISONE 10 MG PO TABS: ORAL_TABLET | ORAL | 0 refills | Status: DC

## 2016-11-03 MED ORDER — LEVOFLOXACIN 750 MG PO TABS: 750.0000 mg | ORAL_TABLET | Freq: Every day | ORAL | 0 refills | Status: DC

## 2016-11-03 NOTE — Discharge Summary (Signed)
Physician Discharge Summary  ORENDA NIE [id number] DOB: 08-12-1966 DOA: 10/31/2016  PCP: Darci Needle, MD  Admit date: 10/31/2016 Discharge date: 11/03/2016  Time spent: 25* minutes  Recommendations for Outpatient Follow-up:  1. 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 Patient also has been complaining of increasing tiredness and decreased appetite for several days. There is no history of nausea vomiting or diarrhea. She denies chest pain. Patient does smoke cigarettes, and occasional marijuana.   Hospital Course:  1. Right lower lobe pneumonia-Patient started on IV Levaquin, urinary strep pneumo antigen negative, lactic acid improved to 1.4, blood cultures 2 negative to date. Will discharge on Po Levaquin 750 mg daily x 5 days 2. Hypokalemia-potassium replaced. 3. COPD exacerbation/bronchitis-  Improved after started on  Solu-Medrol 60 mg IV every 6 hours,Atrovent nebulizers every 6 hours. Will discharge on Prednisone taper. 4. History of venous thrombus embolism- patient has history of left leg DVT with pulmonary embolism and did not take result to for a week when she was having periods.Will repeat CT angiochest to rule out recurrent PE. Continue Xarelto 5. Hypertension-blood pressure stable, continue Benicar HCT  Procedures:  None   Consultations:  None   Discharge Exam: Vitals:   11/03/16 0513  11/03/16 0915  BP: (!) 146/84   Pulse: 71 68  Resp: 18 20  Temp: 97.9 F (36.6 C)     General: Appears in no acute distress Cardiovascular: *RRR, S1S2 Respiratory: Clear bilaterally  Discharge Instructions   Discharge Instructions    Diet - low sodium heart healthy    Complete by:  As directed    Increase activity slowly    Complete by:  As directed      Current Discharge Medication List    START taking these medications   Details  levofloxacin (LEVAQUIN) 750 MG tablet Take 1 tablet (750 mg total) by mouth daily. Qty: 5 tablet, Refills: 0    predniSONE (DELTASONE) 10 MG tablet Prednisone 40 mg po daily x 1 day then Prednisone 30 mg po daily x 1 day then Prednisone 20 mg po daily x 1 day then Prednisone 10 mg daily x 1 day then stop... Qty: 10 tablet, Refills: 0      CONTINUE these medications which have NOT CHANGED   Details  acetaminophen (TYLENOL) 325 MG tablet Take 650 mg by mouth every 6 (six) hours as needed for mild pain.    albuterol (PROVENTIL HFA;VENTOLIN HFA) 108 (90 Base) MCG/ACT inhaler Inhale 1 puff into the lungs every 6 (six) hours as needed for wheezing or shortness of breath. Qty: 6.7 g, Refills: 1   Associated Diagnoses: Wheezing    mometasone-formoterol (DULERA) 200-5 MCG/ACT AERO Inhale 2 puffs into the lungs 2 (two) times daily. Qty: 1 Inhaler, Refills: 1   Associated Diagnoses: Acute bronchitis, unspecified organism; Chronic obstructive pulmonary disease, unspecified COPD type (HCC)    olmesartan-hydrochlorothiazide (BENICAR HCT) 20-12.5 MG  tablet Take 1 tablet by mouth daily. Qty: 30 tablet, Refills: 3   Associated Diagnoses: Essential hypertension    rivaroxaban (XARELTO) 20 MG TABS tablet Take 1 tablet (20 mg total) by mouth daily with supper. Qty: 14 tablet, Refills: 0   Associated Diagnoses: Deep vein thrombosis (DVT) of other vein of lower extremity, unspecified chronicity, unspecified laterality (HCC)      STOP taking these  medications     lisinopril-hydrochlorothiazide (PRINZIDE,ZESTORETIC) 20-25 MG tablet        No Known Allergies    The results of significant diagnostics from this hospitalization (including imaging, microbiology, ancillary and laboratory) are listed below for reference.    Significant Diagnostic Studies: Dg Chest 2 View  Result Date: 10/31/2016 CLINICAL DATA:  Cough and congestion, short of breath EXAM: CHEST  2 VIEW COMPARISON:  03/31/2016 FINDINGS: Normal cardiac silhouette. There is airspace disease in the RIGHT lower lobe. No pneumothorax. Potential small RIGHT effusion. IMPRESSION: RIGHT lower lobe pneumonia. Electronically Signed   By: Suzy Bouchard M.D.   On: 10/31/2016 11:18   Ct Angio Chest Pe W Or Wo Contrast  Result Date: 10/31/2016 CLINICAL DATA:  Four-day history of cough. Also generalized achiness and fatigue. Decreased appetite. EXAM: CT ANGIOGRAPHY CHEST WITH CONTRAST TECHNIQUE: Multidetector CT imaging of the chest was performed using the standard protocol during bolus administration of intravenous contrast. Multiplanar CT image reconstructions and MIPs were obtained to evaluate the vascular anatomy. CONTRAST:  100 mL of Isovue 370 intravenous contrast COMPARISON:  Current chest radiograph.  Chest CT, 05/04/2012. FINDINGS: Cardiovascular: There is satisfactory opacification of the pulmonary arteries. Study is mildly limited due to respiratory motion, which limits assessment of the smaller segmental and subsegmental vessels most evident in the lower lobes. Allowing for this, there is no evidence of a pulmonary embolus. The heart is normal in size and configuration. The great vessels normal in caliber. No significant aortic plaque. No dissection. Mediastinum/Nodes: There are prominent mediastinal lymph nodes, none pathologically enlarged by size criteria. There is a mildly enlarged right suprahilar lymph node measuring 15 mm in short axis. No mediastinal or hilar masses. No  neck base or axillary masses or adenopathy. Lungs/Pleura: There multiple ill-defined nodular opacities throughout the right lung with more confluent opacity in the right lower lobe. There are few similar-appearing airspace opacities in the left upper lobe with 1 small focus in the left lower lobe. There is no pleural effusion. No pneumothorax. Upper Abdomen: No acute abnormality. Musculoskeletal: No chest wall abnormality. No acute or significant osseous findings. Review of the MIP images confirms the above findings. IMPRESSION: 1. No evidence a pulmonary embolus. This study mildly limited due to respiratory motion. 2. Multifocal pneumonia most apparent throughout the right lung. Electronically Signed   By: Lajean Manes M.D.   On: 10/31/2016 18:37    Microbiology: Recent Results (from the past 240 hour(s))  Culture, blood (routine x 2)     Status: None (Preliminary result)   Collection Time: 10/31/16  1:31 PM  Result Value Ref Range Status   Specimen Description BLOOD RIGHT WRIST  Final   Special Requests BOTTLES DRAWN AEROBIC AND ANAEROBIC 5CC  Final   Culture   Final    NO GROWTH 2 DAYS Performed at [facility]    Report Status PENDING  Incomplete  Culture, blood (routine x 2)     Status: None (Preliminary result)   Collection Time: 10/31/16  1:31 PM  Result Value Ref Range Status   Specimen Description  BLOOD LEFT WRIST  Final   Special Requests BOTTLES DRAWN AEROBIC AND ANAEROBIC 5CC  Final   Culture   Final    NO GROWTH 2 DAYS Performed at [facility]    Report Status PENDING  Incomplete  Urine culture     Status: Abnormal   Collection Time: 10/31/16  3:24 PM  Result Value Ref Range Status   Specimen Description URINE, CLEAN CATCH  Final   Special Requests NONE  Final   Culture MULTIPLE SPECIES PRESENT, SUGGEST RECOLLECTION (A)  Final   Report Status 11/01/2016 FINAL  Final     Labs: Basic Metabolic Panel:  Recent Labs Lab 10/31/16 1122 11/01/16 0535  11/02/16 0516  NA 135 140 137  K 3.0* 4.3 3.8  CL 104 113* 105  CO2 23 20* 25  GLUCOSE 153* 135* 168*  BUN 6 6 9   CREATININE 0.66 0.53 0.51  CALCIUM 8.2* 7.7* 8.5*   Liver Function Tests:  Recent Labs Lab 11/01/16 0535  AST 28  ALT 15  ALKPHOS 36*  BILITOT 0.7  PROT 6.5  ALBUMIN 2.9*   No results for input(s): LIPASE, AMYLASE in the last 168 hours. No results for input(s): AMMONIA in the last 168 hours. CBC:  Recent Labs Lab 10/31/16 1122 11/01/16 0535  WBC 5.8 6.1  NEUTROABS 4.8  --   HGB 10.8* 10.5*  HCT 34.2* 33.9*  MCV 71.7* 73.9*  PLT 320 277   Cardiac Enzymes:  Recent Labs Lab 10/31/16 1122  TROPONINI <0.03   BNP: BNP (last 3 results)  Recent Labs  11/01/16 0535  BNP 50.0        Signed:  Eleonore Chiquito S MD.  [facility] 11/03/2016, 12:15 PM

## 2016-11-05 LAB — CULTURE, BLOOD (ROUTINE X 2)
Culture: NO GROWTH
Culture: NO GROWTH

## 2016-11-06 ENCOUNTER — Other Ambulatory Visit: Payer: Self-pay | Admitting: *Deleted

## 2016-11-06 DIAGNOSIS — R062 Wheezing: Secondary | ICD-10-CM

## 2016-11-06 MED ORDER — ALBUTEROL SULFATE HFA 108 (90 BASE) MCG/ACT IN AERS: 1.0000 | INHALATION_SPRAY | Freq: Four times a day (QID) | RESPIRATORY_TRACT | 1 refills | Status: DC | PRN

## 2016-11-08 ENCOUNTER — Emergency Department (HOSPITAL_COMMUNITY): Payer: Self-pay

## 2016-11-08 ENCOUNTER — Inpatient Hospital Stay (HOSPITAL_COMMUNITY)
Admission: EM | Admit: 2016-11-08 | Discharge: 2016-11-13 | DRG: 065 | Disposition: A | Discharge status: Home / Self Care | Payer: Self-pay | Attending: Neurology | Admitting: Neurology

## 2016-11-08 ENCOUNTER — Encounter (HOSPITAL_COMMUNITY): Payer: Self-pay

## 2016-11-08 DIAGNOSIS — I609 Nontraumatic subarachnoid hemorrhage, unspecified: Secondary | ICD-10-CM | POA: Diagnosis present

## 2016-11-08 DIAGNOSIS — Z7901 Long term (current) use of anticoagulants: Secondary | ICD-10-CM

## 2016-11-08 DIAGNOSIS — Z79899 Other long term (current) drug therapy: Secondary | ICD-10-CM

## 2016-11-08 DIAGNOSIS — I82432 Acute embolism and thrombosis of left popliteal vein: Secondary | ICD-10-CM

## 2016-11-08 DIAGNOSIS — Z91048 Other nonmedicinal substance allergy status: Secondary | ICD-10-CM

## 2016-11-08 DIAGNOSIS — I611 Nontraumatic intracerebral hemorrhage in hemisphere, cortical: Secondary | ICD-10-CM

## 2016-11-08 DIAGNOSIS — F17210 Nicotine dependence, cigarettes, uncomplicated: Secondary | ICD-10-CM | POA: Diagnosis present

## 2016-11-08 DIAGNOSIS — Z7951 Long term (current) use of inhaled steroids: Secondary | ICD-10-CM

## 2016-11-08 DIAGNOSIS — O2230 Deep phlebothrombosis in pregnancy, unspecified trimester: Secondary | ICD-10-CM

## 2016-11-08 DIAGNOSIS — I619 Nontraumatic intracerebral hemorrhage, unspecified: Principal | ICD-10-CM | POA: Diagnosis present

## 2016-11-08 DIAGNOSIS — I10 Essential (primary) hypertension: Secondary | ICD-10-CM | POA: Diagnosis present

## 2016-11-08 DIAGNOSIS — Z888 Allergy status to other drugs, medicaments and biological substances status: Secondary | ICD-10-CM

## 2016-11-08 DIAGNOSIS — T45515A Adverse effect of anticoagulants, initial encounter: Secondary | ICD-10-CM | POA: Diagnosis present

## 2016-11-08 DIAGNOSIS — I639 Cerebral infarction, unspecified: Secondary | ICD-10-CM

## 2016-11-08 DIAGNOSIS — I2699 Other pulmonary embolism without acute cor pulmonale: Secondary | ICD-10-CM

## 2016-11-08 DIAGNOSIS — Z6841 Body Mass Index (BMI) 40.0 and over, adult: Secondary | ICD-10-CM

## 2016-11-08 DIAGNOSIS — D689 Coagulation defect, unspecified: Secondary | ICD-10-CM | POA: Diagnosis present

## 2016-11-08 DIAGNOSIS — R569 Unspecified convulsions: Secondary | ICD-10-CM | POA: Diagnosis present

## 2016-11-08 DIAGNOSIS — R4701 Aphasia: Secondary | ICD-10-CM | POA: Diagnosis present

## 2016-11-08 HISTORY — DX: Pneumonia, unspecified organism: J18.9

## 2016-11-08 LAB — CBC
HEMATOCRIT: 38.8 % (ref 36.0–46.0)
Hemoglobin: 12.4 g/dL (ref 12.0–15.0)
MCH: 22.5 pg — ABNORMAL LOW (ref 26.0–34.0)
MCHC: 32 g/dL (ref 30.0–36.0)
MCV: 70.5 fL — ABNORMAL LOW (ref 78.0–100.0)
Platelets: 450 10*3/uL — ABNORMAL HIGH (ref 150–400)
RBC: 5.5 MIL/uL — ABNORMAL HIGH (ref 3.87–5.11)
RDW: 17.1 % — AB (ref 11.5–15.5)
WBC: 17.2 10*3/uL — ABNORMAL HIGH (ref 4.0–10.5)

## 2016-11-08 LAB — RAPID URINE DRUG SCREEN, HOSP PERFORMED
AMPHETAMINES: NOT DETECTED
BENZODIAZEPINES: NOT DETECTED
Barbiturates: NOT DETECTED
COCAINE: NOT DETECTED
OPIATES: NOT DETECTED
TETRAHYDROCANNABINOL: NOT DETECTED

## 2016-11-08 LAB — COMPREHENSIVE METABOLIC PANEL
ALBUMIN: 3.3 g/dL — AB (ref 3.5–5.0)
ALK PHOS: 39 U/L (ref 38–126)
ALT: 17 U/L (ref 14–54)
AST: 15 U/L (ref 15–41)
Anion gap: 12 (ref 5–15)
BILIRUBIN TOTAL: 0.8 mg/dL (ref 0.3–1.2)
BUN: 7 mg/dL (ref 6–20)
CO2: 22 mmol/L (ref 22–32)
CREATININE: 0.72 mg/dL (ref 0.44–1.00)
Calcium: 9.1 mg/dL (ref 8.9–10.3)
Chloride: 102 mmol/L (ref 101–111)
GFR calc Af Amer: >60 mL/min (ref >60)
GFR calc non Af Amer: >60 mL/min (ref >60)
GLUCOSE: 111 mg/dL — AB (ref 65–99)
POTASSIUM: 3.7 mmol/L (ref 3.5–5.1)
Sodium: 136 mmol/L (ref 135–145)
TOTAL PROTEIN: 7 g/dL (ref 6.5–8.1)

## 2016-11-08 LAB — URINALYSIS, ROUTINE W REFLEX MICROSCOPIC
BACTERIA UA: NONE SEEN
Bilirubin Urine: NEGATIVE
Glucose, UA: NEGATIVE mg/dL
Hgb urine dipstick: NEGATIVE
Ketones, ur: 5 mg/dL — AB
Nitrite: NEGATIVE
PROTEIN: 30 mg/dL — AB
SPECIFIC GRAVITY, URINE: 1.02 (ref 1.005–1.030)
pH: 6 (ref 5.0–8.0)

## 2016-11-08 LAB — I-STAT TROPONIN, ED: TROPONIN I, POC: 0.03 ng/mL (ref 0.00–0.08)

## 2016-11-08 LAB — CBG MONITORING, ED: Glucose-Capillary: 113 mg/dL — ABNORMAL HIGH (ref 65–99)

## 2016-11-08 LAB — APTT: aPTT: 27 seconds (ref 24–36)

## 2016-11-08 LAB — I-STAT CG4 LACTIC ACID, ED
Lactic Acid, Venous: 1.56 mmol/L (ref 0.5–1.9)
Lactic Acid, Venous: 1.55 mmol/L (ref 0.5–1.9)

## 2016-11-08 LAB — AMMONIA: Ammonia: 37 umol/L — ABNORMAL HIGH (ref 9–35)

## 2016-11-08 LAB — PROTIME-INR
INR: 1.08
PROTHROMBIN TIME: 14 s (ref 11.4–15.2)

## 2016-11-08 LAB — ETHANOL

## 2016-11-08 MED ORDER — PANTOPRAZOLE SODIUM 40 MG IV SOLR
40.0000 mg | Freq: Every day | INTRAVENOUS | Status: DC
  Administered 2016-11-08 – 2016-11-11 (×4): 40 mg via INTRAVENOUS
  Filled 2016-11-08 (×3): qty 40

## 2016-11-08 MED ORDER — ACETAMINOPHEN 325 MG PO TABS: 650.0000 mg | ORAL_TABLET | ORAL | Status: DC | PRN

## 2016-11-08 MED ORDER — GADOBENATE DIMEGLUMINE 529 MG/ML IV SOLN
20.0000 mL | Freq: Once | INTRAVENOUS | Status: AC | PRN
  Administered 2016-11-08: 20 mL via INTRAVENOUS

## 2016-11-08 MED ORDER — ACETAMINOPHEN 160 MG/5ML PO SOLN: 650.0000 mg | ORAL | Status: DC | PRN

## 2016-11-08 MED ORDER — SENNOSIDES-DOCUSATE SODIUM 8.6-50 MG PO TABS
1.0000 | ORAL_TABLET | Freq: Two times a day (BID) | ORAL | Status: DC
  Administered 2016-11-10: 1 via ORAL
  Filled 2016-11-08 (×6): qty 1

## 2016-11-08 MED ORDER — LABETALOL HCL 5 MG/ML IV SOLN: 10.0000 mg | INTRAVENOUS | Status: DC | PRN

## 2016-11-08 MED ORDER — STROKE: EARLY STAGES OF RECOVERY BOOK
Freq: Once | Status: DC
  Filled 2016-11-08: qty 1

## 2016-11-08 MED ORDER — ACETAMINOPHEN 650 MG RE SUPP: 650.0000 mg | RECTAL | Status: DC | PRN

## 2016-11-08 MED ORDER — LEVOFLOXACIN IN D5W 750 MG/150ML IV SOLN
750.0000 mg | INTRAVENOUS | Status: AC
  Administered 2016-11-08 – 2016-11-09 (×2): 750 mg via INTRAVENOUS
  Filled 2016-11-08 (×2): qty 150

## 2016-11-08 NOTE — ED Notes (Signed)
Code STEMI activation page sent out @ [phone].

## 2016-11-08 NOTE — ED Provider Notes (Signed)
[facility] DEPT Provider Note   CSN: [id number] Arrival date & time: 11/08/16  0920     History   Chief Complaint Chief Complaint  Patient presents with  . Altered Mental Status    HPI Carla Hanson is a 51 y.o. female.  HPI Carla Hanson is a 51 y.o. female with history of bipolar disorder, anxiety, hypertension, presents to emergency department with altered mental status. Patient recently admitted to the hospital and discharged just 3 days ago after being treated for pneumonia. Patient's mother is providing most of the history. She states approximately 2 days ago, patient has developed intermittent headaches, mainly after taking her antibiotic. She has also developed associated confusion and difficulty with speech. She did not receive her dose of antibiotics today or yesterday because of this. She has taken Tylenol for the headache which has not helped. Mother states the patient is having trouble expressing her thoughts and finding words and appears to be disoriented. She denies any drugs or alcohol. She denies any headache at this time. She believes that antibiotics are causing her symptoms. She was also placed on steroids which she has finished. No fever or chills. Coughing improved. No recent falls or injuries. No other complaints.  Past Medical History:  Diagnosis Date  . Anxiety   . Bipolar disorder ([name])   . Hyperlipidemia   . Hypertension   . Lipoma    Abdomen  . LIPOMA 01/20/2008  . Obesity   . Pneumonia   . Tobacco abuse     Patient Active Problem List   Diagnosis Date Noted  . CAP (community acquired pneumonia) 10/31/2016  . Dyspnea 10/31/2016  . Encounter for weight loss counseling 06/09/2016  . Tobacco abuse 05/10/2016  . Chronic obstructive pulmonary disease (COPD) ([place]) 05/10/2016  . RUQ abdominal pain 12/29/2013  . Left leg DVT ([place]) 09/29/2013  . Pre-diabetes 03/02/2013  . History of pulmonary embolism 05/05/2012  . Painful menstrual periods  01/05/2012  . Bipolar disorder ([name]) 07/03/2010  . History of tobacco abuse 08/23/2009  . Hyperlipidemia 04/30/2007  . OBESITY, NOS 01/01/2007  . HYPERTENSION, BENIGN SYSTEMIC 01/01/2007    Past Surgical History:  Procedure Laterality Date  . CESAREAN SECTION    . lipoma removed  03/2011  . ORIF ANKLE FRACTURE  03/13/2012   Procedure: OPEN REDUCTION INTERNAL FIXATION (ORIF) ANKLE FRACTURE;  Surgeon: Jessy Oto, MD;  Location: [facility];  Service: Orthopedics;  Laterality: Right;    OB History    No data available       Home Medications    Prior to Admission medications   Medication Sig Start Date End Date Taking? Authorizing Provider  acetaminophen (TYLENOL) 325 MG tablet Take 650 mg by mouth every 6 (six) hours as needed for mild pain.    Historical Provider, MD  albuterol (PROVENTIL HFA;VENTOLIN HFA) 108 (90 Base) MCG/ACT inhaler Inhale 1 puff into the lungs every 6 (six) hours as needed for wheezing or shortness of breath. 11/06/16   Hillary Corinda Gubler, MD  levofloxacin (LEVAQUIN) 750 MG tablet Take 1 tablet (750 mg total) by mouth daily. 11/03/16   Oswald Hillock, MD  mometasone-formoterol (DULERA) 200-5 MCG/ACT AERO Inhale 2 puffs into the lungs 2 (two) times daily. 09/12/16   Hillary Corinda Gubler, MD  olmesartan-hydrochlorothiazide (BENICAR HCT) 20-12.5 MG tablet Take 1 tablet by mouth daily. 09/04/16   Hillary Corinda Gubler, MD  predniSONE (DELTASONE) 10 MG tablet Prednisone 40 mg po daily x 1 day then Prednisone 30 mg  po daily x 1 day then Prednisone 20 mg po daily x 1 day then Prednisone 10 mg daily x 1 day then stop... 11/03/16   Oswald Hillock, MD  rivaroxaban (XARELTO) 20 MG TABS tablet Take 1 tablet (20 mg total) by mouth daily with supper. 10/03/16   Hillary Corinda Gubler, MD    Family History Family History  Problem Relation Age of Onset  . Asthma Father     Social History Social History  Substance Use Topics  . Smoking status: Light Tobacco Smoker     Packs/day: 0.30    Years: 30.00    Types: Cigarettes    Start date: 11/04/1981  . Smokeless tobacco: Never Used     Comment: in process of quitting on Chantix  . Alcohol use 0.0 oz/week     Comment: 2 beer / week     Allergies   Patient has no known allergies.   Review of Systems Review of Systems  Unable to perform ROS: Mental status change  Psychiatric/Behavioral: Positive for confusion.     Physical Exam Updated Vital Signs BP (!) 131/114 (BP Location: Right Arm)   Pulse 81   Temp 98.5 F (36.9 C) (Oral)   Resp 18   Ht 5\' 3"  (1.6 m)   Wt (!) 142.9 kg   LMP 10/24/2016   SpO2 98%   BMI 55.80 kg/m   Physical Exam  Constitutional: She appears well-developed and well-nourished. No distress.  HENT:  Head: Normocephalic and atraumatic.  Eyes: Conjunctivae and EOM are normal. Pupils are equal, round, and reactive to light.  Neck: Neck supple.  Cardiovascular: Normal rate, regular rhythm and normal heart sounds.   Pulmonary/Chest: Effort normal and breath sounds normal. No respiratory distress. She has no wheezes. She has no rales.  Abdominal: Soft. Bowel sounds are normal. She exhibits no distension. There is no tenderness. There is no rebound.  Musculoskeletal: She exhibits no edema.  Neurological: She is alert.  Patient is able to speak back, however states that she does not feel right, repeats this over and over again. She is following some commands but not others. She is able to move all extremities. Strength is 5 out of 5 and equal bilaterally.  Skin: Skin is warm and dry.  Psychiatric: She has a normal mood and affect. Her behavior is normal.  Nursing note and vitals reviewed.    ED Treatments / Results  Labs (all labs ordered are listed, but only abnormal results are displayed) Labs Reviewed  COMPREHENSIVE METABOLIC PANEL  CBC  AMMONIA  ETHANOL  URINALYSIS, ROUTINE W REFLEX MICROSCOPIC  RAPID URINE DRUG SCREEN, HOSP PERFORMED  CBG MONITORING, ED    I-STAT TROPOININ, ED  I-STAT CG4 LACTIC ACID, ED    EKG  EKG Interpretation None       Radiology No results found.  Procedures Procedures (including critical care time)  Medications Ordered in ED Medications - No data to display   Initial Impression / Assessment and Plan / ED Course  I have reviewed the triage vital signs and the nursing notes.  Pertinent labs & imaging results that were available during my care of the patient were reviewed by me and considered in my medical decision making (see chart for details).  Clinical Course    Patient is from home with altered mental status for 2 days, confusion, difficulty with speech. She is alert, in no acute distress. Will get CT head and labs. Mother at bedside.  11:11 AM CT showing hemorrhagic  strokes. Pt is on xarelto. Paged neurolgoy. Dr. Johnney Killian is aware and seen pt.   11:20 AM Discussed with Dr. Leonel Ramsay neurology, recommended MRV without contrast, MRI with and without contrast of the brain for further evaluation. He will consult later on.  1:03 PM Questioned placing pt on Kcentra, discussed again with Dr. Tobias Alexander, since symptoms for several days, will hold off for now.   Final Clinical Impressions(s) / ED Diagnoses   Final diagnoses:  None    New Prescriptions New Prescriptions   No medications on file     Jeannett Senior, PA-C 11/10/16 [place], MD 11/11/16 [phone]

## 2016-11-08 NOTE — ED Triage Notes (Signed)
Per Pt's family, Pt was admitted on Friday with Pneumonia and treated for three days. Pt was discharged home with steroid and antibiotics. About three days ago, mother noted that patient was no longer able to have conversational communication. She was having trouble getting out her thoughts, and she has been repetitively talking about "something being wrong with her eyes."

## 2016-11-08 NOTE — ED Notes (Signed)
CBG: 113 °

## 2016-11-08 NOTE — ED Notes (Signed)
Dr. Kirkpatrick at bedside 

## 2016-11-08 NOTE — H&P (Signed)
Neurology H&P  CC: Headaches and aphasia  History is obtained from: daughter  HPI: Carla Hanson is a 51 y.o. female with 3 day history of headaches and aphasia. Apparently this has been a static deficit for the past 3 days. On arrival to the emergency room, she had a head CT which shows a a parenchymal hemorrhage in the left parieto-occipital region that is at the service. She also has a little subarachnoid hemorrhage on the contralateral side. There is some associated restricted diffusion with the hemorrhage on the left.  She denies any fall, and daughter concurs.  LKW: 1/2 tpa given?: no, ICH ICH Score: 1   ROS:  Unable to obtain due to altered mental status.   Past Medical History:  Diagnosis Date  . Anxiety   . Bipolar disorder ([place])   . Hyperlipidemia   . Hypertension   . Lipoma    Abdomen  . LIPOMA 01/20/2008  . Obesity   . Pneumonia   . Tobacco abuse      Family History  Problem Relation Age of Onset  . Asthma Father      Social History:  reports that she has been smoking Cigarettes.  She started smoking about 35 years ago. She has a 9.00 pack-year smoking history. She has never used smokeless tobacco. She reports that she drinks alcohol. She reports that she does not use drugs.   Exam: Current vital signs: BP 132/88   Pulse 90   Temp 98.5 F (36.9 C) (Oral)   Resp 19   Ht 5\' 3"  (1.6 m)   Wt (!) 142.9 kg (315 lb)   LMP 10/24/2016   SpO2 93%   BMI 55.80 kg/m  Vital signs in last 24 hours: Temp:  [98.5 F (36.9 C)] 98.5 F (36.9 C) (01/05 0929) Pulse Rate:  [68-96] 90 (01/05 1715) Resp:  [10-23] 19 (01/05 1715) BP: (107-137)/(62-114) 132/88 (01/05 1715) SpO2:  [87 %-98 %] 93 % (01/05 1715) Weight:  [142.9 kg (315 lb)] 142.9 kg (315 lb) (01/05 0929)  Physical Exam  Constitutional: Appears well-developed and well-nourished.  Psych: Affect appropriate to situation Eyes: No scleral injection HENT: No OP obstrucion Head: Normocephalic.   Cardiovascular: Normal rate and regular rhythm.  Respiratory: Effort normal and breath sounds normal to anterior ascultation GI: Soft.  No distension. There is no tenderness.  Skin: WDI  Neuro: Mental Status: Patient is awake, alert, Is able to answer some questions She has perseverative speech, but when she does answer questions and appears to be accurate. Cranial Nerves: II: She is not reliable with visual field testing, with perseverative answers as well as not blinking to threat from either side. She does fixate and track clearly does have vision. Pupils are equal, round, and reactive to light.  III,IV, VI: EOMI without ptosis or diploplia.  V: Facial sensation is symmetric to temperature VII: Facial movement is symmetric.  VIII: hearing is intact to voice X: Uvula elevates symmetrically XI: Shoulder shrug is symmetric. XII: tongue is midline without atrophy or fasciculations.  Motor: Tone is normal. Bulk is normal. 5/5 strength was present in all four extremities.  Sensory: She endorses symmetric sensation bilaterally in the arms and legs Cerebellar: She has postural tremor bilaterally.  I have reviewed labs in epic and the results pertinent to this consultation are: INR and PTT are normal  I have reviewed the images obtained: MRI intraparenchymal hemorrhage at the brain surface on the left.  Impression: 51 year old female with a history of recent  pneumonia on anticoagulation who presents with intraparenchymal hemorrhage. Etiology at this time is not clear, with no history of fall or other reasons to suspect traumatic etiology. Given that she is now 3 days out, I think that a stepdown admission is reasonable, but would still repeat imaging in the morning.  I will also order vascular imaging to further evaluate possible etiologies.  Recommendations: 1) Admit to ICU 2) no antiplatelets or anticoagulants 3) blood pressure control with goal systolic < [id number] 4) Frequent neuro  checks 5) If symptoms worsen or there is decreased mental status, repeat stat head CT 6) PT,OT,ST 7) repeat head CT in the AM 8) continue levaquin for 2 doses(pt did nto take yet today)    Roland Rack, MD [facility] [phone]  If 7pm- 7am, please page neurology on call as listed in [place].

## 2016-11-08 NOTE — ED Notes (Signed)
Patient transported to MRI 

## 2016-11-08 NOTE — ED Notes (Signed)
Sister states the pt has  Nothing implanted. Same information given to MRI.

## 2016-11-08 NOTE — ED Notes (Signed)
Pt returns from MRI ° °

## 2016-11-08 NOTE — ED Notes (Signed)
Pt returns from ct  

## 2016-11-09 ENCOUNTER — Encounter (HOSPITAL_COMMUNITY): Payer: Self-pay | Admitting: Radiology

## 2016-11-09 ENCOUNTER — Inpatient Hospital Stay (HOSPITAL_COMMUNITY): Payer: Self-pay

## 2016-11-09 DIAGNOSIS — I612 Nontraumatic intracerebral hemorrhage in hemisphere, unspecified: Secondary | ICD-10-CM

## 2016-11-09 DIAGNOSIS — R569 Unspecified convulsions: Secondary | ICD-10-CM

## 2016-11-09 DIAGNOSIS — Z86718 Personal history of other venous thrombosis and embolism: Secondary | ICD-10-CM

## 2016-11-09 LAB — BLOOD CULTURE ID PANEL (REFLEXED)

## 2016-11-09 LAB — MRSA PCR SCREENING: MRSA BY PCR: NEGATIVE

## 2016-11-09 MED ORDER — SODIUM CHLORIDE 0.9 % IV SOLN
500.0000 mg | Freq: Two times a day (BID) | INTRAVENOUS | Status: DC
  Administered 2016-11-09 – 2016-11-11 (×5): 500 mg via INTRAVENOUS
  Filled 2016-11-09 (×6): qty 5

## 2016-11-09 MED ORDER — LORAZEPAM 2 MG/ML IJ SOLN
2.0000 mg | Freq: Once | INTRAMUSCULAR | Status: AC
  Administered 2016-11-09: 2 mg via INTRAVENOUS

## 2016-11-09 MED ORDER — LORAZEPAM 2 MG/ML IJ SOLN
INTRAMUSCULAR | Status: AC
  Filled 2016-11-09: qty 1

## 2016-11-09 MED ORDER — IOPAMIDOL (ISOVUE-370) INJECTION 76%
INTRAVENOUS | Status: AC
  Administered 2016-11-09: 100 mL
  Filled 2016-11-09: qty 100

## 2016-11-09 MED ORDER — LEVETIRACETAM 500 MG/5ML IV SOLN
1000.0000 mg | Freq: Once | INTRAVENOUS | Status: AC
  Administered 2016-11-09: 1000 mg via INTRAVENOUS
  Filled 2016-11-09: qty 10

## 2016-11-09 NOTE — Progress Notes (Signed)
VASCULAR LAB PRELIMINARY  PRELIMINARY  PRELIMINARY  PRELIMINARY  Bilateral lower extremity venous duplex completed.    Preliminary report:  There is no obvious evidence of DVT or SVT noted in the bilateral lower extremities.   Horace Wishon, RVT 11/09/2016, 11:56 AM

## 2016-11-09 NOTE — Progress Notes (Addendum)
STROKE TEAM PROGRESS NOTE   HISTORY OF PRESENT ILLNESS (per record) Carla Hanson is a 51 y.o. female with 3 day history of headaches and aphasia. Apparently this has been a static deficit for the past 3 days. On arrival to the emergency room, she had a head CT which shows a parenchymal hemorrhage in the left parieto-occipital region that is at the service. She also has a little subarachnoid hemorrhage on the contralateral side. There is some associated restricted diffusion with the hemorrhage on the left.  She denies any fall, and daughter concurs.  LKW: 1/2 tpa given?: no, ICH ICH Score: 1   SUBJECTIVE (INTERVAL HISTORY) Patient reportedly had a seizure witnessed by nurse. Eyes rolled back.  Neurohospitalist called and she was loaded with Keppra. Stat CT was stable. Talked to daughter Carla Hanson today at the bedside, answered all questions.   OBJECTIVE Temp:  [98.5 F (36.9 C)-99 F (37.2 C)] 98.8 F (37.1 C) (01/06 0427) Pulse Rate:  [68-97] 85 (01/06 0427) Cardiac Rhythm: Normal sinus rhythm (01/06 0427) Resp:  [10-23] 11 (01/06 0427) BP: (107-150)/(62-127) 124/81 (01/06 0427) SpO2:  [87 %-98 %] 97 % (01/06 0427) Weight:  [133.1 kg (293 lb 6.9 oz)-142.9 kg (315 lb)] 133.1 kg (293 lb 6.9 oz) (01/06 0110)  CBC:  Recent Labs Lab 11/08/16 1003  WBC 17.2*  HGB 12.4  HCT 38.8  MCV 70.5*  PLT 450*    Basic Metabolic Panel:  Recent Labs Lab 11/08/16 1003  NA 136  K 3.7  CL 102  CO2 22  GLUCOSE 111*  BUN 7  CREATININE 0.72  CALCIUM 9.1    Lipid Panel:    Component Value Date/Time   CHOL 200 11/09/2014 1032   TRIG 114 11/09/2014 1032   HDL 43 11/09/2014 1032   CHOLHDL 4.7 11/09/2014 1032   VLDL 23 11/09/2014 1032   LDLCALC 134 (H) 11/09/2014 1032   HgbA1c:  Lab Results  Component Value Date   HGBA1C 5.3 11/09/2014   Urine Drug Screen:    Component Value Date/Time   LABOPIA NONE DETECTED 11/08/2016 1117   COCAINSCRNUR NONE DETECTED 11/08/2016 1117    LABBENZ NONE DETECTED 11/08/2016 1117   AMPHETMU NONE DETECTED 11/08/2016 1117   THCU NONE DETECTED 11/08/2016 1117   LABBARB NONE DETECTED 11/08/2016 1117      IMAGING  Dg Chest 2 View 11/08/2016 1. No acute cardiopulmonary process.  2. Significant interval improvement in multifocal patchy airspace opacities compared 10/31/2016 consistent with a resolving pneumonia.  3. Stable cardiomegaly.    Ct Head Wo Contrast 11/08/2016 1. Edema in the left temporal occipital cortex with regional cortical/subarachnoid hemorrhage. This likely reflects a hemorrhagic infarct. Given location and demographics would include MRV at time of brain MRI.  2. Small volume subarachnoid hemorrhage at the high right frontal convexity, without neighboring brain edema. Question generalized vasculopathy. In this patient with recent pneumonia and elevated WBC, suggest post-contrast brain MRI as well.    Mr Jeri Cos Wo Contrast / Mr Mrv Head Wo Cm 11/08/2016   1. Stable trefoil focus of hemorrhage in the lateral left occipital lobe with surrounding edema and mild regional mass effect. There is possibly mild parenchymal infarct along the margins of the hemorrhage. Superimposed small focus of right vertex subarachnoid hemorrhages confirmed but lacks associated edema or mass effect.  2. Although this study is negative for underlying dural sinus thrombosis, it is possible that #1 are the sequelae of thrombosis of small cortical veins - especially if the patient  has been dehydrated or has an underlying coagulopathy.  3. No underlying chronic cerebral blood products to suggest a chronic angiopathy. No prior infarcts identified.   ADDENDUM REPORT:  Study discussed by telephone with Dr. Roland Rack 11/08/2016 at 1640 hours. We reviewed the patient medical record and laboratory studies which do not reveal evidence of dehydration. And also the patient is currently on Xarelto - which argues against the presence of acute venous  thrombosis. He inquired about septic emboli, which I doubt given the absence of other acute foci in the brain. I mentioned RCVS, but that diagnosis is not supposed to include parenchymal hemorrhage. I also advised the I see no evidence of an underlying AVM. Further, a left occipital AVM would not explain the apparent acute right vertex subarachnoid hemorrhage. Dr. Leonel Ramsay then inquired about the possibility of Coup-contrecoup injury (i.e. with a left lateral occipital hemorrhagic contusion and contralateral trace right vertex subarachnoid hemorrhage), and although cerebral contusions are generally frontal or temporal that diagnosis would explain the bilateral acute findings.     PHYSICAL EXAM  Afebrile. Head is nontraumatic. Neck is supple without bruit. Cardiac exam no murmur or gallop. Lungs are clear to auscultation. Distal pulses are well felt. Neurological Exam : .Awake, alert, said yes to her name, aphasic no dysarthria, can repeat but difficulty with answering questions could not tell me her name. EOMI. Conjugate midline gaze. Blinks to threat bilat. PERRL. Face symmetric.  Follows some simple commands. Tongue midline. Cough and gag intact. Motor system exam reveals: moving all extremities equally, withdraws in all extremities, 1+ reflexes, normal tone, toes downgoing.   Gait: Unable to test      ASSESSMENT/PLAN [name] is a 51 y.o. female with history of tobacco and alcohol abuse, pneumonia, obesity, hypertension, hyperlipidemia, bipolar disorder, and a hx of DVTs treated with Xarelto  presenting with a 3 day history of headaches and aphasia.  She did not receive IV t-PA due to [place]. After discussion with daughter today cannot rule out trauma as cause of hemorrhage.  Parenchymal hemorrhage in the left parieto-occipital region:  Dominant. On Xarelto PTA.  Resultant  aphasia  MRI - hemorrhage in the lateral left occipital lobe with surrounding edema and mild regional mass  effect.   MRV - negative for underlying dural sinus thrombosis.  Pending CTA to look for underlying vascular anomaly  Carotid Doppler - not indicated  2D Echo - 11/01/2016 - EF 55-60%. No cardiac source of emboli identified.  LDL - not indicated  HgbA1c - not indicated  VTE prophylaxis - SCDs  Diet NPO time specified  Xarelto (rivaroxaban) daily prior to admission, now on No antithrombotic secondary to parenchymal hemorrhage.  Ongoing aggressive stroke risk factor management  Therapy recommendations:  pending  Disposition:  Pending  Hypertension  Stable  Home blood pressure medications - Benicar / hydrochlorothiazide 20 / 12.5  Current blood pressure medications - PRN labetalol  Long-term BP goal normotensive   Hyperlipidemia  Home meds: No lipid lowering medications prior to admission   Other Stroke Risk Factors  Cigarette smoker - advised to stop smoking  ETOH use, advised to drink no more than 1 drink(s) a day  Obesity, Body mass index is 47.36 kg/m., recommend weight loss, diet and exercise as appropriate   History of DVTs - Xarelto PTA   Other Active Problems  History of DVTs - Xarelto PTA - Last LE dopplers 04/17/2016 - will order  Leukocytosis - 17.2 -> recheck in a.m. (afebrile)  Alcohol abuse, on CIWA protocol  Pneumonia on [facility] day # 1   Personally examined patient and images, and have participated in and made any corrections needed to history, physical, neuro exam,assessment and plan as stated above.  I have personally obtained the history, evaluated lab date, reviewed imaging studies and agree with radiology interpretations.    Sarina Ill, MD Stroke Neurology    To contact Stroke Continuity provider, please refer to [email or web address] After hours, contact General Neurology

## 2016-11-09 NOTE — Progress Notes (Signed)
SLP Cancellation Note  Patient Details Name: Carla Hanson MRN: [id number] DOB: [birth year]   Cancelled treatment:       Reason Eval/Treat Not Completed: Medical issues which prohibited therapy; pt had seizures this am; will f/u at a later date as pt is able   ADAMS,PAT, M.S., CCC-SLP 11/09/2016, 11:24 AM

## 2016-11-09 NOTE — Progress Notes (Signed)
OT Cancellation Note  Patient Details Name: CARRA COUSINO MRN: [id number] DOB: April 29, 1966   Cancelled Treatment:    Reason Eval/Treat Not Completed: Medical issues which prohibited therapy. Pt still on bedrest and with seizures this morning, spoke to RN and we will hold therapy eval for today.   Almon Register [id number] 11/09/2016, 10:33 AM

## 2016-11-09 NOTE — Progress Notes (Signed)
PT Cancellation Note  Patient Details Name: Carla Hanson MRN: [id number] DOB: Jan 19, 1966   Cancelled Treatment:    Reason Eval/Treat Not Completed: Medical issues which prohibited therapy. Pt with seizures this AM and on bedrest. Will cancel today and follow up at later date.   Shary Decamp [place] 11/09/2016, 10:38 AM Suanne Marker PT [phone]

## 2016-11-09 NOTE — Progress Notes (Signed)
At 0706 patient started seizing again, MD notified, ordered 2mg  Ativan (administered at 0716). Rapid response at bedside. MD arrived at bedside to assess patient. RN and rapid response nurse transferred patient to CT. After returning to the unit, EEG was set up. Patient resting, VS stable. RN will continue to monitor.

## 2016-11-09 NOTE — Progress Notes (Signed)
Patient appeared to have a seizure. RN walked into patients room to patients eyes rolled back and patient small amount of blood and saliva coming out of mouth. Elevated BP 211/89. Once patient stopped seizing, RN assessed pupils, which were reactive but not equal. R pupil sluggish. RN notified MD. STAT CT was ordered, as well as keppra BID. Rapid response was notified and did not think it was necessary to call code stroke due to previously being aware of patients subarachnoid hemorrhage. RN will continue to monitor patient. VS currently stable.

## 2016-11-09 NOTE — Procedures (Signed)
History: 51 yo F with seizures this am.   Sedation: Ativan prior to recording  Technique: This is a 21 channel routine scalp EEG performed at the bedside with bipolar and monopolar montages arranged in accordance to the international 10/20 system of electrode placement. One channel was dedicated to EKG recording.    Background: There is a posterior dominant rhythm of 9 Hz. There is mild generalized irregular delta slow activity superimposed on the background and a focal prominence of irregular delta in the left temporal region. There are also rare left temporal sharp waves. Sleep is recorded with symmetric structures.   Photic stimulation: Physiologic driving is not performed  EEG Abnormalities: 1) Left temporal sharp waves 2) Irregular left temporal delta activity 3) generalized irregular   Clinical Interpretation: This EEG is consistent with potential area of epileptogenicity in the left temporal region. There is also evidence of a generalized non-specific cerebral dysfunction(encephalopathy) as can be seen with post ictal state. No seizure was seen on this study.   Roland Rack, MD [facility] [phone]  If 7pm- 7am, please page neurology on call as listed in [place].

## 2016-11-09 NOTE — Progress Notes (Signed)
RN notified daughter Arnette Felts of mothers change in status overnight. Password set up.

## 2016-11-09 NOTE — Progress Notes (Signed)
EEG completed; results pending.    

## 2016-11-09 NOTE — Progress Notes (Signed)
Called to bedside for patient having seizure.  On my arrival to patients bedside, Rn at bedside, Keppra just hung.  Patient has a right gaze preference, extremities are rigid, and appears to have bit her tongue blood in her mouth.  Dr. Leonel Ramsay at bedside. 2mg  IV ativan given.  Patient brought down to CT scan, and transported back to her room.  Patient appears to be coming out of it.  RN to call if assistance needed

## 2016-11-09 NOTE — Progress Notes (Signed)
  PHARMACY - PHYSICIAN COMMUNICATION CRITICAL VALUE ALERT - BLOOD CULTURE IDENTIFICATION (BCID)  Results for orders placed or performed during the hospital encounter of 11/08/16  Blood Culture ID Panel (Reflexed) (Collected: 11/08/2016  7:59 PM)  Result Value Ref Range   Enterococcus species NOT DETECTED NOT DETECTED   Listeria monocytogenes NOT DETECTED NOT DETECTED   Staphylococcus species DETECTED (A) NOT DETECTED   Staphylococcus aureus NOT DETECTED NOT DETECTED   Methicillin resistance DETECTED (A) NOT DETECTED   Streptococcus species NOT DETECTED NOT DETECTED   Streptococcus agalactiae NOT DETECTED NOT DETECTED   Streptococcus pneumoniae NOT DETECTED NOT DETECTED   Streptococcus pyogenes NOT DETECTED NOT DETECTED   Acinetobacter baumannii NOT DETECTED NOT DETECTED   Enterobacteriaceae species NOT DETECTED NOT DETECTED   Enterobacter cloacae complex NOT DETECTED NOT DETECTED   Escherichia coli NOT DETECTED NOT DETECTED   Klebsiella oxytoca NOT DETECTED NOT DETECTED   Klebsiella pneumoniae NOT DETECTED NOT DETECTED   Proteus species NOT DETECTED NOT DETECTED   Serratia marcescens NOT DETECTED NOT DETECTED   Haemophilus influenzae NOT DETECTED NOT DETECTED   Neisseria meningitidis NOT DETECTED NOT DETECTED   Pseudomonas aeruginosa NOT DETECTED NOT DETECTED   Candida albicans NOT DETECTED NOT DETECTED   Candida glabrata NOT DETECTED NOT DETECTED   Candida krusei NOT DETECTED NOT DETECTED   Candida parapsilosis NOT DETECTED NOT DETECTED   Candida tropicalis NOT DETECTED NOT DETECTED   2 sets of BCx drawn from 1/5, 1 of 4 (aerobic bottle only) + for GPC. BCID + for CONS with methicillin resistance detected. Patient is not on any antibiotics, likely contaminant.  Name of physician (or Provider) Contacted: [place]  Changes to prescribed antibiotics required: none  Romona Curls 11/09/2016  8:33 PM

## 2016-11-10 DIAGNOSIS — S06350S Traumatic hemorrhage of left cerebrum without loss of consciousness, sequela: Secondary | ICD-10-CM

## 2016-11-10 LAB — COMPREHENSIVE METABOLIC PANEL
ALBUMIN: 2.7 g/dL — AB (ref 3.5–5.0)
ALK PHOS: 32 U/L — AB (ref 38–126)
ALT: 17 U/L (ref 14–54)
AST: 17 U/L (ref 15–41)
Anion gap: 8 (ref 5–15)
BILIRUBIN TOTAL: 0.7 mg/dL (ref 0.3–1.2)
BUN: 10 mg/dL (ref 6–20)
CO2: 23 mmol/L (ref 22–32)
CREATININE: 0.74 mg/dL (ref 0.44–1.00)
Calcium: 8.3 mg/dL — ABNORMAL LOW (ref 8.9–10.3)
Chloride: 108 mmol/L (ref 101–111)
GFR calc Af Amer: >60 mL/min (ref >60)
GLUCOSE: 89 mg/dL (ref 65–99)
Potassium: 3.6 mmol/L (ref 3.5–5.1)
Sodium: 139 mmol/L (ref 135–145)
TOTAL PROTEIN: 6 g/dL — AB (ref 6.5–8.1)

## 2016-11-10 LAB — CBC WITH DIFFERENTIAL/PLATELET
BASOS PCT: 0 %
Basophils Absolute: 0 10*3/uL (ref 0.0–0.1)
EOS PCT: 1 %
Eosinophils Absolute: 0.1 10*3/uL (ref 0.0–0.7)
HEMATOCRIT: 33 % — AB (ref 36.0–46.0)
HEMOGLOBIN: 10.3 g/dL — AB (ref 12.0–15.0)
Lymphocytes Relative: 25 %
Lymphs Abs: 3 10*3/uL (ref 0.7–4.0)
MCH: 22.6 pg — ABNORMAL LOW (ref 26.0–34.0)
MCHC: 31.2 g/dL (ref 30.0–36.0)
MCV: 72.4 fL — AB (ref 78.0–100.0)
MONO ABS: 0.8 10*3/uL (ref 0.1–1.0)
MONOS PCT: 7 %
NEUTROS PCT: 67 %
Neutro Abs: 8.1 10*3/uL — ABNORMAL HIGH (ref 1.7–7.7)
Platelets: 385 10*3/uL (ref 150–400)
RBC: 4.56 MIL/uL (ref 3.87–5.11)
RDW: 17.7 % — ABNORMAL HIGH (ref 11.5–15.5)
WBC: 12 10*3/uL — AB (ref 4.0–10.5)

## 2016-11-10 NOTE — Progress Notes (Signed)
PT Cancellation Note  Patient Details Name: Carla Hanson MRN: [id number] DOB: [birth year]   Cancelled Treatment:    Reason Eval/Treat Not Completed: Medical issues which prohibited therapy.  Patient remains on bedrest per orders.   **MD:  Please write activity orders when appropriate for patient.  PT will initiate evaluation at that time.  Thank you.   Despina Pole 11/10/2016, 12:26 PM Carita Pian. Sanjuana Kava, [place] Pager [phone]

## 2016-11-10 NOTE — Progress Notes (Signed)
Pt BP in the 80s. MD notified and ordered 2 L NS Bolus. After 556mL pts BP 121/72. MD notified of response and ordered to [facility] fluids and continue to monitor and give another 500 mL if pressure drops again.

## 2016-11-10 NOTE — Progress Notes (Signed)
STROKE TEAM PROGRESS NOTE   HISTORY OF PRESENT ILLNESS (per record) Carla Hanson is a 51 y.o. female with 3 day history of headaches and aphasia. Apparently this has been a static deficit for the past 3 days. On arrival to the emergency room, she had a head CT which shows a parenchymal hemorrhage in the left parieto-occipital region that is at the service. She also has a little subarachnoid hemorrhage on the contralateral side. There is some associated restricted diffusion with the hemorrhage on the left.  She denies any fall, and daughter concurs.  LKW: 1/2 tpa given?: no, ICH ICH Score: 1   SUBJECTIVE (INTERVAL HISTORY) She is improving with her speech, able to appropriately answer more with expressive aphasia. No family at bedside today.   OBJECTIVE Temp:  [97.4 F (36.3 C)-98.4 F (36.9 C)] 98.1 F (36.7 C) (01/07 0802) Pulse Rate:  [65-93] 83 (01/07 0802) Cardiac Rhythm: Normal sinus rhythm (01/07 0802) Resp:  [11-20] 14 (01/07 0802) BP: (112-152)/(65-83) 137/65 (01/07 0802) SpO2:  [93 %-98 %] 98 % (01/07 0802)  CBC:   Recent Labs Lab 11/08/16 1003 11/10/16 0230  WBC 17.2* 12.0*  NEUTROABS  --  8.1*  HGB 12.4 10.3*  HCT 38.8 33.0*  MCV 70.5* 72.4*  PLT 450* 0000000    Basic Metabolic Panel:   Recent Labs Lab 11/08/16 1003 11/10/16 0230  NA 136 139  K 3.7 3.6  CL 102 108  CO2 22 23  GLUCOSE 111* 89  BUN 7 10  CREATININE 0.72 0.74  CALCIUM 9.1 8.3*    Lipid Panel:     Component Value Date/Time   CHOL 200 11/09/2014 1032   TRIG 114 11/09/2014 1032   HDL 43 11/09/2014 1032   CHOLHDL 4.7 11/09/2014 1032   VLDL 23 11/09/2014 1032   LDLCALC 134 (H) 11/09/2014 1032   HgbA1c:  Lab Results  Component Value Date   HGBA1C 5.3 11/09/2014   Urine Drug Screen:     Component Value Date/Time   LABOPIA NONE DETECTED 11/08/2016 1117   COCAINSCRNUR NONE DETECTED 11/08/2016 1117   LABBENZ NONE DETECTED 11/08/2016 1117   AMPHETMU NONE DETECTED 11/08/2016  1117   THCU NONE DETECTED 11/08/2016 1117   LABBARB NONE DETECTED 11/08/2016 1117      IMAGING  Dg Chest 2 View 11/08/2016 1. No acute cardiopulmonary process.  2. Significant interval improvement in multifocal patchy airspace opacities compared 10/31/2016 consistent with a resolving pneumonia.  3. Stable cardiomegaly.    Ct Head Wo Contrast 11/08/2016 1. Edema in the left temporal occipital cortex with regional cortical/subarachnoid hemorrhage. This likely reflects a hemorrhagic infarct. Given location and demographics would include MRV at time of brain MRI.  2. Small volume subarachnoid hemorrhage at the high right frontal convexity, without neighboring brain edema. Question generalized vasculopathy. In this patient with recent pneumonia and elevated WBC, suggest post-contrast brain MRI as well.    Mr Jeri Cos Wo Contrast / Mr Mrv Head Wo Cm 11/08/2016   1. Stable trefoil focus of hemorrhage in the lateral left occipital lobe with surrounding edema and mild regional mass effect. There is possibly mild parenchymal infarct along the margins of the hemorrhage. Superimposed small focus of right vertex subarachnoid hemorrhages confirmed but lacks associated edema or mass effect.  2. Although this study is negative for underlying dural sinus thrombosis, it is possible that #1 are the sequelae of thrombosis of small cortical veins - especially if the patient has been dehydrated or has an underlying coagulopathy.  3. No underlying chronic cerebral blood products to suggest a chronic angiopathy. No prior infarcts identified.   ADDENDUM REPORT:  Study discussed by telephone with Dr. Roland Rack 11/08/2016 at 1640 hours. We reviewed the patient medical record and laboratory studies which do not reveal evidence of dehydration. And also the patient is currently on Xarelto - which argues against the presence of acute venous thrombosis. He inquired about septic emboli, which I doubt given the absence  of other acute foci in the brain. I mentioned RCVS, but that diagnosis is not supposed to include parenchymal hemorrhage. I also advised the I see no evidence of an underlying AVM. Further, a left occipital AVM would not explain the apparent acute right vertex subarachnoid hemorrhage. Dr. Leonel Ramsay then inquired about the possibility of Coup-contrecoup injury (i.e. with a left lateral occipital hemorrhagic contusion and contralateral trace right vertex subarachnoid hemorrhage), and although cerebral contusions are generally frontal or temporal that diagnosis would explain the bilateral acute findings.     CT Angio Head 11/09/2016 No significant intracranial stenosis or occlusion. Negative for intracranial vascular malformation Dural veins are not adequately opacified due to timing of the scan. Left occipital parietal hematoma stable. No evidence of underlying mass or vascular malformation.    PHYSICAL EXAM  Afebrile. Head is nontraumatic. Neck is supple without bruit. Cardiac exam no murmur or gallop. Lungs are clear to auscultation. Distal pulses are well felt. Neurological Exam : .Awake, alert, said yes to her name, expressive >> receptive aphasia no dysarthria, can repeat but difficulty with answering questions could tell me her name but not the month or year, can name pen and clock and can repeat "today is a sunny day", follows some simple commands such to lift both arms but could not lift just her right arm and lifted her left instead. EOMI. Conjugate midline gaze. Blinks to threat bilat. PERRL. Face symmetric.  Tongue midline. Cough and gag intact. Motor system exam reveals: moving all extremities equally, withdraws in all extremities, 1+ reflexes, normal tone, toes downgoing.   Gait: Unable to test      ASSESSMENT/PLAN Carla Hanson is a 51 y.o. female with history of tobacco and alcohol abuse, pneumonia, obesity, hypertension, hyperlipidemia, bipolar disorder, and a hx of DVTs  treated with Xarelto  presenting with a 3 day history of headaches and aphasia.  She did not receive IV t-PA due to [place]. After discussion with daughter today cannot rule out trauma as cause of hemorrhage.  Parenchymal hemorrhage in the left parieto-occipital region:  Dominant. On Xarelto PTA.  Resultant  aphasia  MRI - hemorrhage in the lateral left occipital lobe with surrounding edema and mild regional mass effect.   MRV - negative for underlying dural sinus thrombosis.  CTA Head - L occipital parietal hematoma stable. No evidence of underlying mass or vascular malformation.  Carotid Doppler - not indicated  Bilateral lower extremity duplex - negative for DVT  2D Echo - 11/01/2016 - EF 55-60%. No cardiac source of emboli identified.  LDL - not indicated  HgbA1c - not indicated  VTE prophylaxis - SCDs Diet Heart Room service appropriate? Yes; Fluid consistency: Thin  Xarelto (rivaroxaban) daily prior to admission, now on No antithrombotic secondary to parenchymal hemorrhage.  Ongoing aggressive stroke risk factor management  Therapy recommendations:  pending  Disposition:  Pending  Hypertension  Stable  Home blood pressure medications - Benicar / hydrochlorothiazide 20 / 12.5  Current blood pressure medications - PRN labetalol  Long-term BP goal normotensive  Hyperlipidemia  Home meds: No lipid lowering medications prior to admission   Other Stroke Risk Factors  Cigarette smoker - advised to stop smoking  Hx of lower extremity DVTs  ETOH use, advised to drink no more than 1 drink(s) a day  Obesity, Body mass index is 47.36 kg/m., recommend weight loss, diet and exercise as appropriate     Other Active Problems  Leukocytosis - 17.2 -> recheck in a.m. (afebrile) -> 12.0  Alcohol abuse, on CIWA protocol  Pneumonia on Levaquin  Hx of DVTs - Xarelto PTA - BLE duplex - 11/09/2016 - negative for DVT  Anemia - 10.3 / [facility] day #  2   Personally examined patient and images, and have participated in and made any corrections needed to history, physical, neuro exam,assessment and plan as stated above.  I have personally obtained the history, evaluated lab date, reviewed imaging studies and agree with radiology interpretations. Patient with left intraparenchymal parietal hemorrhage and right frontal SAH of unknown etiology at this time thought to be traumatic. Aphasia improving. 15 minutes was spent in the care of this patient.   Sarina Ill, MD Stroke Neurology    To contact Stroke Continuity provider, please refer to [email or web address] After hours, contact General Neurology

## 2016-11-10 NOTE — Progress Notes (Signed)
SLP Cancellation Note  Patient Details Name: Carla Hanson MRN: [id number] DOB: 25-Mar-1966   Cancelled treatment:       Reason Eval/Treat Not Completed: Patient declined, no reason specified.  Cognitive/linguistic evaluation was attempted.  The patient became easily frustrated and was unable or unwilling to answer posed questions and she requested that the therapist leave.  Nursing reported that last night she was having tremendous difficulty expressing herself and that the patient was very frustrated.  Nursing reported that it is better this AM.  When ST entered the room the patient was on the telephone ordering her breakfast.  ST will follow up next date to attempt evaluation.  Shelly Flatten, MA, [facility] SLP [phone] Lamar Sprinkles 11/10/2016, 9:47 AM

## 2016-11-11 ENCOUNTER — Inpatient Hospital Stay (HOSPITAL_COMMUNITY): Payer: Self-pay

## 2016-11-11 LAB — CULTURE, BLOOD (ROUTINE X 2)

## 2016-11-11 MED ORDER — GADOBENATE DIMEGLUMINE 529 MG/ML IV SOLN
20.0000 mL | Freq: Once | INTRAVENOUS | Status: AC | PRN
  Administered 2016-11-11: 20 mL via INTRAVENOUS

## 2016-11-11 NOTE — Progress Notes (Signed)
PT Cancellation Note  Patient Details Name: Carla Hanson MRN: [id number] DOB: 06/23/1966   Cancelled Treatment:    Reason Eval/Treat Not Completed: Patient not medically ready.  Pt currently on bedrest.  Please advance activity order once appropriate for PT and mobility.     [place], [place]  [phone] 11/11/2016, 8:41 AM

## 2016-11-11 NOTE — Progress Notes (Signed)
PT Cancellation Note  Patient Details Name: Carla Hanson MRN: [id number] DOB: 01/20/1966   Cancelled Treatment:    Reason Eval/Treat Not Completed: Other (comment)  Pt off bedrest, but now eating lunch.  Will try back another time.     Thornton Papas Maicy Filip 11/11/2016, 1:27 PM

## 2016-11-11 NOTE — Evaluation (Signed)
Speech Language Pathology Evaluation Patient Details Name: Carla Hanson MRN: [id number] DOB: [birth year] Today's Date: 11/11/2016 Time: 1010-1033 SLP Time Calculation (min) (ACUTE ONLY): 23 min  Problem List:  Patient Active Problem List   Diagnosis Date Noted  . Seizure ([place])   . ICH (intracerebral hemorrhage) ([place]) 11/08/2016  . CAP (community acquired pneumonia) 10/31/2016  . Dyspnea 10/31/2016  . Encounter for weight loss counseling 06/09/2016  . Tobacco abuse 05/10/2016  . Chronic obstructive pulmonary disease (COPD) ([place]) 05/10/2016  . RUQ abdominal pain 12/29/2013  . Left leg DVT ([place]) 09/29/2013  . Pre-diabetes 03/02/2013  . History of pulmonary embolism 05/05/2012  . Painful menstrual periods 01/05/2012  . Bipolar disorder ([place]) 07/03/2010  . History of tobacco abuse 08/23/2009  . Hyperlipidemia 04/30/2007  . OBESITY, NOS 01/01/2007  . HYPERTENSION, BENIGN SYSTEMIC 01/01/2007   Past Medical History:  Past Medical History:  Diagnosis Date  . Anxiety   . Bipolar disorder ([place])   . Hyperlipidemia   . Hypertension   . Lipoma    Abdomen  . LIPOMA 01/20/2008  . Obesity   . Pneumonia   . Tobacco abuse    Past Surgical History:  Past Surgical History:  Procedure Laterality Date  . CESAREAN SECTION    . lipoma removed  03/2011  . ORIF ANKLE FRACTURE  03/13/2012   Procedure: OPEN REDUCTION INTERNAL FIXATION (ORIF) ANKLE FRACTURE;  Surgeon: Jessy Oto, MD;  Location: [facility];  Service: Orthopedics;  Laterality: Right;   HPI:  51 y.o.femalewith history of tobacco and alcohol abuse, pneumonia, obesity, hypertension, hyperlipidemia, bipolar disorder, and a hx of DVTs treated with Xarelto presenting with a 3 day history of headaches and aphasia.Shedidnot receive IV t-PA due to [place] in the left parieto-occipital region. Surrounding efema and mild regional mass effect also noted as well as a little subarachnoid hemorrhage on the contralateral side.   Assessment /  Plan / Recommendation Clinical Impression  51t appears to have a receptive > expressive aphasia, although assessment is also largely impacted by pt's low frustration tolerance. She follows one step commands well and can repeat at the sentence level; however, as the complexity of the task increases, even to two step commands or more abstract ones, she becomes frustrated and does not want to participate. Suspect that this is related to decreased comprehension, as errors are noted when she does try. With divergent naming tasks she is only able to produce 5 words in one minute with Min cues, and she seems to have more anomia during spontaneous speech. Although pt does seem to have some emergent awareness, she does not show have intellectual awareness of her language deficits. Pt will need 24/7 assist and f/u SLP services upon d/c.    SLP Assessment  Patient needs continued Speech Lanaguage Pathology Services    Follow Up Recommendations   (tba)    Frequency and Duration min 2x/week  2 weeks      SLP Evaluation Cognition  Overall Cognitive Status: Difficult to assess (aphasia) Arousal/Alertness: Awake/alert Orientation Level: Oriented to person;Oriented to place;Disoriented to time;Disoriented to situation Attention: Sustained Sustained Attention: Impaired Sustained Attention Impairment: Verbal basic Awareness: Impaired Awareness Impairment: Intellectual impairment;Emergent impairment;Anticipatory impairment Problem Solving: Impaired Problem Solving Impairment: Verbal basic Behaviors: Poor frustration tolerance Safety/Judgment: Impaired Comments: question impact of effort        Comprehension  Auditory Comprehension Overall Auditory Comprehension: Impaired Commands: Impaired One Step Basic Commands: 75-100% accurate Two Step Basic Commands: 25-49% accurate Complex Commands: 25-49% accurate Conversation:  Simple Interfering Components: Processing speed;Working  Field seismologist: Repetition    Expression Expression Primary Mode of Expression: Verbal Verbal Expression Overall Verbal Expression: Impaired Initiation: No impairment Level of Generative/Spontaneous Verbalization: Sentence Repetition: No impairment Naming: Impairment Confrontation: Within functional limits Divergent: 25-49% accurate Pragmatics: Impairment Impairments: Eye contact Non-Verbal Means of Communication: Not applicable   Oral / Motor  Oral Motor/Sensory Function Overall Oral Motor/Sensory Function:  (appears WFL) Motor Speech Overall Motor Speech: Appears within functional limits for tasks assessed   GO                    Germain Osgood 11/11/2016, 10:59 AM  Germain Osgood, M.A. CCC-SLP [phone]

## 2016-11-11 NOTE — Progress Notes (Signed)
STROKE TEAM PROGRESS NOTE   HISTORY OF PRESENT ILLNESS (per record) Carla Hanson is a 51 y.o. female with 3 day history of headaches and aphasia. Apparently this has been a static deficit for the past 3 days. On arrival to the emergency room, she had a head CT which shows a parenchymal hemorrhage in the left parieto-occipital region that is at the service. She also has a little subarachnoid hemorrhage on the contralateral side. There is some associated restricted diffusion with the hemorrhage on the left.  She denies any fall, and daughter concurs.  LKW: 1/2 tpa given?: no, ICH ICH Score: 1   SUBJECTIVE (INTERVAL HISTORY) She is improving with her speech, able to appropriately answer more with expressive aphasia. No family at bedside today.   OBJECTIVE Temp:  [98 F (36.7 C)-99.6 F (37.6 C)] 98 F (36.7 C) (01/08 0821) Pulse Rate:  [73-102] 73 (01/08 0821) Cardiac Rhythm: Normal sinus rhythm;Heart block (01/08 0700) Resp:  [13-21] 13 (01/08 0821) BP: (82-142)/(42-90) 125/76 (01/08 0821) SpO2:  [95 %-100 %] 98 % (01/08 0821)  CBC:   Recent Labs Lab 11/08/16 1003 11/10/16 0230  WBC 17.2* 12.0*  NEUTROABS  --  8.1*  HGB 12.4 10.3*  HCT 38.8 33.0*  MCV 70.5* 72.4*  PLT 450* 0000000    Basic Metabolic Panel:   Recent Labs Lab 11/08/16 1003 11/10/16 0230  NA 136 139  K 3.7 3.6  CL 102 108  CO2 22 23  GLUCOSE 111* 89  BUN 7 10  CREATININE 0.72 0.74  CALCIUM 9.1 8.3*    Lipid Panel:     Component Value Date/Time   CHOL 200 11/09/2014 1032   TRIG 114 11/09/2014 1032   HDL 43 11/09/2014 1032   CHOLHDL 4.7 11/09/2014 1032   VLDL 23 11/09/2014 1032   LDLCALC 134 (H) 11/09/2014 1032   HgbA1c:  Lab Results  Component Value Date   HGBA1C 5.3 11/09/2014   Urine Drug Screen:     Component Value Date/Time   LABOPIA NONE DETECTED 11/08/2016 1117   COCAINSCRNUR NONE DETECTED 11/08/2016 1117   LABBENZ NONE DETECTED 11/08/2016 1117   AMPHETMU NONE DETECTED  11/08/2016 1117   THCU NONE DETECTED 11/08/2016 1117   LABBARB NONE DETECTED 11/08/2016 1117      IMAGING  No results found.     PHYSICAL EXAM  Afebrile. Head is nontraumatic. Neck is supple without bruit. Cardiac exam no murmur or gallop. Lungs are clear to auscultation. Distal pulses are well felt. Neurological Exam : .Awake, alert,  mild receptive greater than expressive aphasia. Decreased spontaneous speech but can talk short sentences. Able to name and repeat quite well., follows some simple commands such to lift both arms but could not lift just her right arm and lifted her left instead. EOMI. Conjugate midline gaze. Blinks to threat bilat. PERRL. Face symmetric.  Tongue midline. Cough and gag intact. Motor system exam reveals: moving all extremities equally, withdraws in all extremities, 1+ reflexes, normal tone, toes downgoing.   Gait: Unable to test      ASSESSMENT/PLAN [name] is a 51 y.o. female with history of tobacco and alcohol abuse, pneumonia, obesity, hypertension, hyperlipidemia, bipolar disorder, and a hx of DVTs treated with Xarelto  presenting with a 3 day history of headaches and aphasia.  She did not receive IV t-PA due to [place]. After discussion with daughter today cannot rule out trauma as cause of hemorrhage.  Parenchymal hemorrhage in the left parieto-occipital region:  Dominant.  On Xarelto PTA. Etiology indeterminate-traumatic versus spontaneous .unlikely to be hypertensive given normal blood pressure or, amyloid angiopathy given young age  Resultant mild aphasia  MRI - hemorrhage in the lateral left occipital lobe with surrounding edema and mild regional mass effect.   MRV - negative for underlying dural sinus thrombosis.  CTA Head - L occipital parietal hematoma stable. No evidence of underlying mass or vascular malformation.  Carotid Doppler - not indicated  Bilateral lower extremity duplex - negative for DVT  2D Echo - 11/01/2016 - EF  55-60%. No cardiac source of emboli identified.  LDL - not indicated  HgbA1c - not indicated  VTE prophylaxis - SCDs Diet Heart Room service appropriate? Yes; Fluid consistency: Thin  Xarelto (rivaroxaban) daily prior to admission, now on No antithrombotic secondary to parenchymal hemorrhage.  Ongoing aggressive stroke risk factor management  Therapy recommendations:  pending  Disposition:  Pending  Hypertension  Stable  Home blood pressure medications - Benicar / hydrochlorothiazide 20 / 12.5  Current blood pressure medications - PRN labetalol  Long-term BP goal normotensive   Hyperlipidemia  Home meds: No lipid lowering medications prior to admission   Other Stroke Risk Factors  Cigarette smoker - advised to stop smoking  Hx of lower extremity DVTs  ETOH use, advised to drink no more than 1 drink(s) a day  Obesity, Body mass index is 47.36 kg/m., recommend weight loss, diet and exercise as appropriate     Other Active Problems  Leukocytosis - 17.2 -> recheck in a.m. (afebrile) -> 12.0  Alcohol abuse, on CIWA protocol  Pneumonia on Levaquin  Hx of DVTs - Xarelto PTA - BLE duplex - 11/09/2016 - negative for DVT  Anemia - 10.3 / [facility] day # 3   Personally examined patient and images, and have participated in and made any corrections needed to history, physical, neuro exam,assessment and plan as stated above.  I have personally obtained the history, evaluated lab date, reviewed imaging studies and agree with radiology interpretations. Patient with left intraparenchymal parietal hemorrhage and right frontal SAH of indeterminate etiology at this time thought to be traumatic. Aphasia improving.Starleen Blue out of bed. Therapy consults. Transfer to neurology floor bed. Greater than 50% time during this 25 minute visit was spent on counseling and coordination of care about aphasia and brain hemorrhage Antony Contras,, MD Stroke Neurology    To contact  Stroke Continuity provider, please refer to [email or web address] After hours, contact General Neurology

## 2016-11-11 NOTE — Progress Notes (Signed)
OT Cancellation Note  Patient Details Name: Carla Hanson MRN: [id number] DOB: 1966/04/23   Cancelled Treatment:    Reason Eval/Treat Not Completed: Patient not medically ready (Pt with bedrest orders. Will continue to follow.)  Malka So 11/11/2016, 8:24 AM  [phone]

## 2016-11-12 MED ORDER — OLMESARTAN MEDOXOMIL-HCTZ 20-12.5 MG PO TABS: 1.0000 | ORAL_TABLET | Freq: Every day | ORAL | Status: DC

## 2016-11-12 MED ORDER — LEVETIRACETAM 500 MG PO TABS
500.0000 mg | ORAL_TABLET | Freq: Two times a day (BID) | ORAL | Status: DC
  Administered 2016-11-12 – 2016-11-13 (×3): 500 mg via ORAL
  Filled 2016-11-12 (×3): qty 1

## 2016-11-12 MED ORDER — IRBESARTAN 150 MG PO TABS
150.0000 mg | ORAL_TABLET | Freq: Every day | ORAL | Status: DC
  Administered 2016-11-12 – 2016-11-13 (×2): 150 mg via ORAL
  Filled 2016-11-12 (×2): qty 1

## 2016-11-12 MED ORDER — HYDROCHLOROTHIAZIDE 12.5 MG PO CAPS
12.5000 mg | ORAL_CAPSULE | Freq: Every day | ORAL | Status: DC
  Administered 2016-11-12 – 2016-11-13 (×2): 12.5 mg via ORAL
  Filled 2016-11-12 (×2): qty 1

## 2016-11-12 MED ORDER — PANTOPRAZOLE SODIUM 40 MG PO TBEC
40.0000 mg | DELAYED_RELEASE_TABLET | Freq: Every day | ORAL | Status: DC
  Administered 2016-11-12: 40 mg via ORAL
  Filled 2016-11-12: qty 1

## 2016-11-12 NOTE — Evaluation (Signed)
Occupational Therapy Evaluation Patient Details Name: Carla Hanson MRN: [id number] DOB: 05/30/1966 Today's Date: 11/12/2016    History of Present Illness Carla Hanson is a 51 y.o. female with history of tobacco and alcohol abuse, pneumonia, obesity, hypertension, hyperlipidemia, bipolar disorder, and a hx of DVTs treated with Xarelto  presenting with a 3 day history of headaches and aphasia.    left parieto-occipital IPH secondary to Xarelto associated coagulopathy   Clinical Impression   PTA, pt reports living alone and independence with all ADL and functional mobility. Pt currently presents with decreased short-term memory, need for increased processing time to follow single step commands, and decreased safety awareness. Pt additionally presents with decreased motor planning skills evidenced by difficulty sequencing tooth brushing task. Additionally, she attempted to brush her teeth with her toothpaste bottle, which may difficulty with ideation. Pt demonstrates emergent awareness but is not aware of her deficits. These difficulties are impacting pt's safety with ADL and she requires supervision at this time. During evaluation, pt demonstrated some expressive as well as receptive difficulties which may be impacting her ability to follow commands. She becomes easily agitated and reports that she just wants to go home. Pt lives alone and pt's mother reports that she is able to check in on her. OT will continue to follow acutely to improve safety with daily activities. Recommend neuro outpatient OT services for OT follow-up.     Follow Up Recommendations  Outpatient OT;Supervision - Intermittent (Neuro outpatient OT)    Equipment Recommendations  None recommended by OT    Recommendations for Other Services       Precautions / Restrictions Precautions Precautions: None Restrictions Weight Bearing Restrictions: No      Mobility Bed Mobility Overal bed mobility: Modified  Independent             General bed mobility comments: Sitting at EOB on arrival  Transfers Overall transfer level: Modified independent Equipment used: None                  Balance Overall balance assessment: No apparent balance deficits (not formally assessed)   Sitting balance-Leahy Scale: Good       Standing balance-Leahy Scale: Good Standing balance comment: able to turn in circle, pick up item from floor, eyes closed feet together 30 seconds, and briefly SLS                            ADL Overall ADL's : Needs assistance/impaired     Grooming: Supervision/safety;Standing   Upper Body Bathing: Supervision/ safety;Sitting   Lower Body Bathing: Supervison/ safety;Sit to/from stand   Upper Body Dressing : Supervision/safety;Sitting   Lower Body Dressing: Supervision/safety;Sit to/from stand   Toilet Transfer: Supervision/safety;Ambulation;BSC Toilet Transfer Details (indicate cue type and reason): Simulated with sit<>stand followed by ambulation. Toileting- Clothing Manipulation and Hygiene: Supervision/safety;Sit to/from stand   Tub/ Shower Transfer: Supervision/safety;Ambulation   Functional mobility during ADLs: Supervision/safety General ADL Comments: Pt able to complete ADL at supervision level. Pt with decreased awareness of need for supervision for safety.     Vision Vision Assessment?: Yes (limited assessment as pt agitated) Eye Alignment: Within Functional Limits Ocular Range of Motion: Within Functional Limits Alignment/Gaze Preference: Within Defined Limits Tracking/Visual Pursuits: Able to track stimulus in all quads without difficulty Additional Comments: Limited assessment performed as pt becoming agitated. No deficits noted functionally.   Perception     Praxis Praxis Praxis tested?: Deficits Deficits: Initiation;Ideation;Organization  Praxis-Other Comments: Pt initially attempting to use toothpaste as a toothbrush. When  asked to brush teeth, initially began to wash her hands. Pt realized this and began to brush teeth attempting to use toothpaste as a toothbrush. Able to correct with VC's.    Pertinent Vitals/Pain Pain Assessment: No/denies pain     Hand Dominance Right   Extremity/Trunk Assessment Upper Extremity Assessment Upper Extremity Assessment: Overall WFL for tasks assessed   Lower Extremity Assessment Lower Extremity Assessment: Overall WFL for tasks assessed       Communication Communication Communication: Receptive difficulties;Expressive difficulties;Other (comment) (mildly evident on eval)   Cognition Arousal/Alertness: Awake/alert Behavior During Therapy: Agitated Overall Cognitive Status: Difficult to assess Area of Impairment: Attention;Memory;Following commands;Safety/judgement;Awareness;Problem solving   Current Attention Level: Sustained Memory: Decreased short-term memory Following Commands: Follows one step commands with increased time;Follows multi-step commands inconsistently Safety/Judgement: Decreased awareness of safety;Decreased awareness of deficits Awareness: Emergent Problem Solving: Slow processing;Decreased initiation;Difficulty sequencing General Comments: Pt with low frustration tolerance and easily agitated during session. She demonstrates decreased short-term memory, little understanding of deficits/limitations as well as situation. Noted difficulty sequencing as well as increased time required to respond to questions. Unable to compelte clock drawing test with verbal cues as pt reporting she is not sure what therapist is asking her to do. Difficult to assess cognition as pt with receptive and expressive difficulties.   General Comments       Exercises       Shoulder Instructions      Home Living Family/patient expects to be discharged to:: Private residence Living Arrangements: Alone Available Help at Discharge: Family (Difficult to discern; mother  reports will check in on pt) Type of Home: Apartment Home Access: Level entry     Home Layout: One level     Bathroom Shower/Tub: Tub/shower unit Shower/tub characteristics: Architectural technologist: Standard     Home Equipment: Grab bars - tub/shower;None      Lives With: Alone    Prior Functioning/Environment Level of Independence: Independent                 OT Problem List: Decreased cognition;Decreased safety awareness (Impaired motor planning)   OT Treatment/Interventions: Self-care/ADL training;Therapeutic activities;Cognitive remediation/compensation;Patient/family education    OT Goals(Current goals can be found in the care plan section) Acute Rehab OT Goals Patient Stated Goal: to go home as soon as possible OT Goal Formulation: With patient/family Time For Goal Achievement: 11/19/16 Potential to Achieve Goals: Good ADL Goals Additional ADL Goal #1: Pt will demonstrate improved motor planning skills to sequence multi-step task with no errors. Additional ADL Goal #2: Pt will incorporate 2 compensatory strategies for memory in order to improve safety with daily activities. Additional ADL Goal #3: Pt will demonstrate improved problem solving skills in order to   OT Frequency: Min 2X/week   Barriers to D/C:            Co-evaluation              End of Session Nurse Communication: Mobility status  Activity Tolerance: Treatment limited secondary to agitation Patient left: with family/visitor present (sitting at EOB)   Time: 1452-1510 OT Time Calculation (min): 18 min Charges:  OT General Charges $OT Visit: 1 Procedure OT Evaluation $OT Eval Moderate Complexity: 1 Procedure  Norman Herrlich, OTR/L [phone] 11/12/2016, 4:39 PM

## 2016-11-12 NOTE — Progress Notes (Signed)
Pt transferred to 5M07 via wheelchair by SWOT RN. Vital signs stable, all belonging with patient (cell phone, flowers, bed room shoes etc.) Report called to receiving RN, all questions answered.

## 2016-11-12 NOTE — Progress Notes (Signed)
IR asked to eval for IVC filter placement Chart reviewed. Pt with prior hx of DVT and PE +DVT 04/2016, has been on Xarelto. 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 LE duplex this admission id negative for DVT.  Given current status of no DVT or PE, pt is > 6 months removed from most recent DVT. She shouldn't need further anticoagulation. I could not find a hx of hypercoagulable condition. IVC filter would not be indicated at present time. Risk factor reduction (ie weight loss and smoking cessation) would certainly be recommended.  Ascencion Dike PA-C Interventional Radiology 11/12/2016 12:19 PM

## 2016-11-12 NOTE — Progress Notes (Signed)
STROKE TEAM PROGRESS NOTE    SUBJECTIVE (INTERVAL HISTORY) Patient without complaints. Agreeable now to work with PT. Asking about Keppra.   OBJECTIVE Temp:  [97.5 F (36.4 C)-98.9 F (37.2 C)] 98.9 F (37.2 C) (01/09 1000) Pulse Rate:  [57-73] 57 (01/09 1000) Cardiac Rhythm: Normal sinus rhythm;Heart block (01/09 0709) Resp:  [14-20] 18 (01/09 1000) BP: (107-144)/(54-81) 114/64 (01/09 1000) SpO2:  [99 %-100 %] 100 % (01/09 1000) Weight:  [132.6 kg (292 lb 6.4 oz)] 132.6 kg (292 lb 6.4 oz) (01/08 2353)  CBC:   Recent Labs Lab 11/08/16 1003 11/10/16 0230  WBC 17.2* 12.0*  NEUTROABS  --  8.1*  HGB 12.4 10.3*  HCT 38.8 33.0*  MCV 70.5* 72.4*  PLT 450* 0000000    Basic Metabolic Panel:   Recent Labs Lab 11/08/16 1003 11/10/16 0230  NA 136 139  K 3.7 3.6  CL 102 108  CO2 22 23  GLUCOSE 111* 89  BUN 7 10  CREATININE 0.72 0.74  CALCIUM 9.1 8.3*   Urine Drug Screen:     Component Value Date/Time   LABOPIA NONE DETECTED 11/08/2016 1117   COCAINSCRNUR NONE DETECTED 11/08/2016 1117   LABBENZ NONE DETECTED 11/08/2016 1117   AMPHETMU NONE DETECTED 11/08/2016 1117   THCU NONE DETECTED 11/08/2016 1117   LABBARB NONE DETECTED 11/08/2016 1117      IMAGING  Mr Brain W Contrast  Result Date: 11/11/2016 CLINICAL DATA:  Continued surveillance of intracranial hemorrhage. Possible septic emboli. EXAM: MRI HEAD WITH CONTRAST TECHNIQUE: Multiplanar, multiecho pulse sequences of the brain and surrounding structures were obtained with intravenous contrast. CONTRAST:  20 mL MultiHance. COMPARISON:  Initial CT head 11/08/2016. MR head without and with contrast 11/08/2016. MRV 11/08/2016. CTA head neck 11/09/2016. FINDINGS: Precontrast T1 weighted imaging was not performed; therefore, it is not possible to distinguish between methemoglobin conversion of hemorrhage versus postcontrast enhancement. Brain: 41 x 38 x 36 mm LEFT lateral occipital contusion/hematoma, with primarily central  greater than peripheral intrinsic T1 shortening and/or postcontrast enhancement. No other areas of abnormal T1 shortening/enhancement are observed. Vascular: Flow voids are maintained. Major dural venous sinuses appear patent. Skull and upper cervical spine: Unchanged and negative. Sinuses/Orbits: Negative. Other: None. Compared with prior study 3 days ago, the lesion did not display significant T1 shortening. IMPRESSION: Intrinsic T1 shortening and/or postcontrast enhancement of the LEFT lateral occipital contusion/hematoma. No other areas of abnormal T1 shortening or enhancement are observed. Electronically Signed   By: Staci Righter M.D.   On: 11/11/2016 16:41   EEG 1) Left temporal sharp waves 2) Irregular left temporal delta activity 3) generalized irregular    PHYSICAL EXAM  Afebrile. Head is nontraumatic. Neck is supple without bruit. Cardiac exam no murmur or gallop. Lungs are clear to auscultation. Distal pulses are well felt. Neurological Exam : .Awake, alert,  mild receptive greater than expressive aphasia. Decreased spontaneous speech but can talk short sentences. Able to name and repeat quite well., follows some simple commands such to lift both arms but could not lift just her right arm and lifted her left instead. EOMI. Conjugate midline gaze. Blinks to threat bilat. PERRL. Face symmetric.  Tongue midline. Cough and gag intact. Motor system exam reveals: moving all extremities equally, withdraws in all extremities, 1+ reflexes, normal tone, toes downgoing.   Gait: Unable to test     ASSESSMENT/PLAN Ms. HALAH JAREMA is a 51 y.o. female with history of tobacco and alcohol abuse, pneumonia, obesity, hypertension, hyperlipidemia, bipolar disorder, and a  hx of DVTs treated with Xarelto  presenting with a 3 day history of headaches and aphasia.  She did not receive IV t-PA due to [place]. After discussion with daughter today cannot rule out trauma as cause of hemorrhage.  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 unlikely to be hypertensive given normal blood pressure or amyloid angiopathy given young age  Resultant mild aphasia  MRI - hemorrhage in the lateral left occipital lobe with surrounding edema and mild regional mass effect.   MRV - negative for underlying dural sinus thrombosis.  CTA Head - L occipital parietal hematoma stable. No evidence of underlying mass or vascular malformation.  Carotid Doppler - not indicated  Bilateral lower extremity duplex - negative for DVT  2D Echo - 11/01/2016 - EF 55-60%. No cardiac source of emboli identified.  VTE prophylaxis - SCDs Diet Heart Room service appropriate? Yes; Fluid consistency: Thin  Xarelto (rivaroxaban) daily prior to admission, now on No antithrombotic secondary to parenchymal hemorrhage.  Ongoing aggressive stroke risk factor management  Therapy recommendations:  pending  Disposition:  Pending  Hypertension  Stable  Home blood pressure medications - Benicar / hydrochlorothiazide 20 / 12.5  Current blood pressure medications - PRN labetalol Resume home BP meds  Seizures  Unwitnessed   Secondary to IPH  Abnormal EEG  Treated with IV Keppra, changed to PO  Will need Keppra for Life  Hx of lower extremity DVTs and PEs  on xarelto PTA  BLE duplex - 11/09/2016 - negative for DVT  Not a candidate for long - term AC due to brain hemorhagge  Needs IVC filter placed given no LT AC  Other Stroke Risk Factors  Cigarette smoker - advised to stop smoking  ETOH abuse, advised to drink no more than 1 drink(s) a day.  on CIWA protocol  Morbid Obesity, Body mass index is 47.19 kg/m., recommend weight loss, diet and exercise as appropriate   Other Active Problems  Leukocytosis - 17.2 -> recheck in a.m. (afebrile) -> 12.0  Pneumonia on Levaquin  Anemia - 10.3 / [facility] day # [place] for Pager  information 11/12/2016 10:48 AM  I have personally examined this patient, reviewed notes, independently viewed imaging studies, participated in medical decision making and plan of care.ROS completed by me personally and pertinent positives fully documented  I have made any additions or clarifications directly to the above note. Agree with note above.  Discuss need for IVC filter as patient is not a candidate for long-term anticoagulation. with interventional radiologist Dr. Pascal Lux who feels there is no emergent need  as there is no acute DVT or PE at present and I have discussed this with hematology and schedule electively as needed. I have reviewed patient's prior lab work which shows negative hypercoagulable panel in 2015. Greater than 50% time during this 25 minute visit was spent on counseling and coordination of care about his intracerebral hemorrhage, DVT and IVC filter discussion  Antony Contras, [place] Pager: [phone] 11/12/2016 4:02 PM  To contact Stroke Continuity provider, please refer to [email or web address] After hours, contact General Neurology

## 2016-11-12 NOTE — Evaluation (Signed)
Physical Therapy Evaluation Patient Details Name: Carla Hanson MRN: [id number] DOB: 03-16-1966 Today's Date: 11/12/2016   History of Present Illness  [name] is a 51 y.o. female with history of tobacco and alcohol abuse, pneumonia, obesity, hypertension, hyperlipidemia, bipolar disorder, and a hx of DVTs treated with Xarelto  presenting with a 3 day history of headaches and aphasia.    left parieto-occipital IPH secondary to Xarelto associated coagulopathy  Clinical Impression  Patient presents without mobility deficits at this time.  Seemed irritated to get up and move, but seems at baseline for mobility.  No further skilled PT needs at this time.  Will sign off.     Follow Up Recommendations No PT follow up    Equipment Recommendations  None recommended by PT    Recommendations for Other Services       Precautions / Restrictions Precautions Precautions: None      Mobility  Bed Mobility Overal bed mobility: Modified Independent                Transfers Overall transfer level: Modified independent                  Ambulation/Gait Ambulation/Gait assistance: Independent Ambulation Distance (Feet): 30 Feet Assistive device: None Gait Pattern/deviations: WFL(Within Functional Limits)     General Gait Details: slow pace, agreeable to in room only at this time, to doorway and back around bed then to bathroom   Stairs            Wheelchair Mobility    Modified Rankin (Stroke Patients Only) Modified Rankin (Stroke Patients Only) Pre-Morbid Rankin Score: No symptoms Modified Rankin: Slight disability     Balance Overall balance assessment: Independent   Sitting balance-Leahy Scale: Good       Standing balance-Leahy Scale: Good Standing balance comment: able to turn in circle, pick up item from floor, eyes closed feet together 30 seconds, and briefly SLS                             Pertinent Vitals/Pain Pain  Assessment: No/denies pain    Home Living Family/patient expects to be discharged to:: Private residence Living Arrangements: Alone   Type of Home: Apartment Home Access: Level entry     Home Layout: One level Home Equipment: Grab bars - tub/shower;None      Prior Function Level of Independence: Independent               Hand Dominance   Dominant Hand: Right    Extremity/Trunk Assessment   Upper Extremity Assessment Upper Extremity Assessment: Overall WFL for tasks assessed    Lower Extremity Assessment Lower Extremity Assessment: Overall WFL for tasks assessed       Communication   Communication: Other (comment) (reports some mild deficits, no overt signs during eval)  Cognition Arousal/Alertness: Awake/alert Behavior During Therapy: Agitated Overall Cognitive Status: Within Functional Limits for tasks assessed                      General Comments      Exercises     Assessment/Plan    PT Assessment Patent does not need any further PT services  PT Problem List            PT Treatment Interventions      PT Goals (Current goals can be found in the Care Plan section)  Acute Rehab PT Goals PT Goal  Formulation: All assessment and education complete, DC therapy    Frequency     Barriers to discharge        Co-evaluation               End of Session   Activity Tolerance: Patient tolerated treatment well Patient left: in bed;with call bell/phone within reach           Time: 1315-1328 PT Time Calculation (min) (ACUTE ONLY): 13 min   Charges:   PT Evaluation $PT Eval Low Complexity: 1 Procedure     PT G CodesReginia Naas 2016-12-01, 2:04 PM Magda Kiel, [place] 12-01-2016

## 2016-11-12 NOTE — Progress Notes (Signed)
Patient arrived to 5M07 from 3S. Patient alert and oriented. Patient has expressive aphasia and will only answer a few questions. Vital signs taken and are stable. Telemetry box 5M10 placed on patient.  Patient resting comfortably. Will continue to monitor.

## 2016-11-13 ENCOUNTER — Inpatient Hospital Stay (HOSPITAL_COMMUNITY): Payer: Self-pay

## 2016-11-13 LAB — CULTURE, BLOOD (ROUTINE X 2): CULTURE: NO GROWTH

## 2016-11-13 MED ORDER — LEVETIRACETAM 500 MG PO TABS: 500.0000 mg | ORAL_TABLET | Freq: Two times a day (BID) | ORAL | 2 refills | Status: DC

## 2016-11-13 MED ORDER — ASPIRIN 81 MG PO TBEC: 81.0000 mg | DELAYED_RELEASE_TABLET | Freq: Every day | ORAL | 12 refills | Status: DC

## 2016-11-13 MED ORDER — ASPIRIN EC 81 MG PO TBEC: 81.0000 mg | DELAYED_RELEASE_TABLET | Freq: Every day | ORAL | Status: DC

## 2016-11-13 NOTE — Progress Notes (Signed)
STROKE TEAM PROGRESS NOTE    SUBJECTIVE (INTERVAL HISTORY) Patient is stable. CT scan of the head done today shows stable appearance of hematoma without any increase in bleeding size. Patient wants to go home.   OBJECTIVE Temp:  [98.2 F (36.8 C)-99.2 F (37.3 C)] 98.7 F (37.1 C) (01/10 1002) Pulse Rate:  [55-77] 77 (01/10 1002) Resp:  [18-20] 20 (01/10 1002) BP: (120-140)/(59-83) 140/83 (01/10 1002) SpO2:  [94 %-100 %] 94 % (01/10 1002)  CBC:   Recent Labs Lab 11/08/16 1003 11/10/16 0230  WBC 17.2* 12.0*  NEUTROABS  --  8.1*  HGB 12.4 10.3*  HCT 38.8 33.0*  MCV 70.5* 72.4*  PLT 450* 0000000    Basic Metabolic Panel:   Recent Labs Lab 11/08/16 1003 11/10/16 0230  NA 136 139  K 3.7 3.6  CL 102 108  CO2 22 23  GLUCOSE 111* 89  BUN 7 10  CREATININE 0.72 0.74  CALCIUM 9.1 8.3*   Urine Drug Screen:     Component Value Date/Time   LABOPIA NONE DETECTED 11/08/2016 1117   COCAINSCRNUR NONE DETECTED 11/08/2016 1117   LABBENZ NONE DETECTED 11/08/2016 1117   AMPHETMU NONE DETECTED 11/08/2016 1117   THCU NONE DETECTED 11/08/2016 1117   LABBARB NONE DETECTED 11/08/2016 1117      IMAGING  No results found. EEG 1) Left temporal sharp waves 2) Irregular left temporal delta activity 3) generalized irregular    PHYSICAL EXAM  Afebrile. Head is nontraumatic. Neck is supple without bruit. Cardiac exam no murmur or gallop. Lungs are clear to auscultation. Distal pulses are well felt. Neurological Exam : .Awake, alert,  mild receptive greater than expressive aphasia. Decreased spontaneous speech but can talk short sentences. Able to name and repeat quite well., follows some simple commands such to lift both arms but could not lift just her right arm and lifted her left instead. EOMI. Conjugate midline gaze. Blinks to threat bilat. PERRL. Face symmetric.  Tongue midline. Cough and gag intact. Motor system exam reveals: moving all extremities equally, withdraws in all  extremities, 1+ reflexes, normal tone, toes downgoing.   Gait: Unable to test     ASSESSMENT/PLAN [name] is a 51 y.o. female with history of tobacco and alcohol abuse, pneumonia, obesity, hypertension, hyperlipidemia, bipolar disorder, and a hx of DVTs treated with Xarelto  presenting with a 3 day history of headaches and aphasia.  She did not receive IV t-PA due to [place]. After discussion with daughter today cannot rule out trauma as cause of hemorrhage.  Stroke: left parieto-occipital IPH secondary to Xarelto associated coagulopathy. unlikely to be hypertensive given normal blood pressure or amyloid angiopathy given young age  Resultant mild aphasia  MRI - hemorrhage in the lateral left occipital lobe with surrounding edema and mild regional mass effect.   MRV - negative for underlying dural sinus thrombosis.  CTA Head - L occipital parietal hematoma stable. No evidence of underlying mass or vascular malformation.  Carotid Doppler - not indicated  Bilateral lower extremity duplex - negative for DVT  2D Echo - 11/01/2016 - EF 55-60%. No cardiac source of emboli identified.  VTE prophylaxis - SCDs Diet Heart Room service appropriate? Yes; Fluid consistency: Thin  Xarelto (rivaroxaban) daily prior to admission, now on No antithrombotic secondary to parenchymal hemorrhage.  Ongoing aggressive stroke risk factor management  Therapy recommendations:  pending  Disposition:  Pending  Hypertension  Stable  Home blood pressure medications - Benicar / hydrochlorothiazide 20 / 12.5  Current blood pressure medications - PRN labetalol Resume home BP meds  Seizures  Unwitnessed   Secondary to IPH  Abnormal EEG  Treated with IV Keppra, changed to PO  Will need Keppra for Life  Hx of lower extremity DVTs and PEs  on xarelto PTA  BLE duplex - 11/09/2016 - negative for DVT  Not a candidate for long - term AC due to brain hemorhagge  No IVC filter at this  time.  Aspirin 81mg  daily      Follow up Dr. Beryle Beams as an OP   Other Stroke Risk Factors  Cigarette smoker - advised to stop smoking  ETOH abuse, advised to drink no more than 1 drink(s) a day.  on CIWA protocol  Morbid Obesity, Body mass index is 47.19 kg/m., recommend weight loss, diet and exercise as appropriate   Other Active Problems  Leukocytosis - 17.2 -> recheck in a.m. (afebrile) -> 12.0  Pneumonia on Levaquin  Anemia - 10.3 / [facility] day # [place] for Pager information 11/13/2016 10:43 AM   I have personally examined this patient, reviewed notes, independently viewed imaging studies, participated in medical decision making and plan of care.ROS completed by me personally and pertinent positives fully documented  I have made any additions or clarifications directly to the above note.  Plan start aspirin 81 mg daily. Discharge home today. Outpatient referral to occupational. Follow-up with Dr. Beryle Beams for decision on long-term anticoagulation given history of DVT and pulmonary embolism. Follow-up with Dr. Leonie Man in the stroke clinic in 6 weeks.p  Antony Contras, MD Medical Director [facility] Pager: [phone] 11/13/2016 1:38 PM   To contact Stroke Continuity provider, please refer to [email or web address] After hours, contact General Neurology

## 2016-11-13 NOTE — Discharge Summary (Addendum)
Physician Discharge Summary  Patient ID: MYRTHA HOLDERBY MRN: [id number] DOB/AGE: Apr 07, 1966 52 y.o.  Admit date: 11/08/2016 Discharge date: 11/13/2016  Admission Diagnoses: Headaches and aphasia  Discharge Diagnoses:  Active Problems:   ICH (intracerebral hemorrhage) (HCC) Left parieto-occipital due to Xarelto associated coagulopathy.   Seizure [facility]) symptomatic secondary to intracerebral hemorrhage   Discharged Condition: fair  Hospital Course:  HANAN SAJDAK is a 51 y.o. female with 3 day history of headaches and aphasia. Apparently this has been a static deficit for the past 3 days. On arrival to the emergency room, she had a head CT which shows a a parenchymal hemorrhage in the left parieto-occipital region that is at the service. She also has a little subarachnoid hemorrhage on the contralateral side. There is some associated restricted diffusion with the hemorrhage on the left. She denied any fall, and daughter concured. LKW: 1/2 tpa given?: no, ICH ICH Score: 1  she was admitted to the end ICU and blood pressure was tightly controlled. Follow-up imaging showed stable appearance of the hematoma. She had a witnessed seizure the day of admission when she was found to have eyes rolled back and being unresponsive. EEG was obtained which showed left temporal sharp waves and irritability. Patient was started on Keppra for seizures. Patient Xarelto was stopped. MRI scan of the brain confirmed left temporal lobar hematoma which was stable with mild surrounding edema. There are small amount of subarachnoid blood in the right convexity. MR venogram did not show any large cerebral venous sinus thrombosis. Transthoracic echo showed normal ejection fraction. Patient's aphasia and speech gradually improved. She was seen by physical and occupational therapy and felt to have only outpatient therapy needs. On the day of discharge patient had a follow-up CT scan done which showed stable appearance of the  left rectus hematoma. She was started on aspirin 81 mg daily. There was some discussion about possibly patient requiring IVC filter however after this this was discussed with interventional radiology as well as hematologist Dr. Beryle Beams  felt that this was not necessary. Patient was advised to have outpatient follow-up consultation by Dr. Beryle Beams to discuss long-term need for anticoagulation given her prior history of DVT as well as pulmonary embolism. Given recent intracerebral hemorrhage on Xarelto imaging at anticoagulation was not considered to be safe   Consults: None  Significant Diagnostic Studies  :MRI - hemorrhage in the lateral left occipital lobe with surrounding edema and mild regional mass effect.   MRV - negative for underlying dural sinus thrombosis.  CTA Head - L occipital parietal hematoma stable. No evidence of underlying mass or vascular malformation.  Carotid Doppler - not indicated  Bilateral lower extremity duplex - negative for DVT 2D Echo - 11/01/2016 - EF 55-60%. No cardiac source of emboli identified  EEG : 1) Left temporal sharp waves 2) Irregular left temporal delta activity 3) generalized irregular  Treatments: anticoagulation: ASA  Discharge Exam: Blood pressure 140/83, pulse 77, temperature 98.7 F (37.1 C), temperature source Oral, resp. rate 20, height 5\' 6"  (1.676 m), weight 292 lb 6.4 oz (132.6 kg), last menstrual period 10/24/2016, SpO2 94 %.  Afebrile. Head is nontraumatic. Neck is supple without bruit. Cardiac exam no murmur or gallop. Lungs are clear to auscultation. Distal pulses are well felt. Neurological Exam : .Awake, alert,  mild receptive greater than expressive aphasia. Decreased spontaneous speech but can talk short sentences. Able to name and repeat quite well., follows some simple commands such to lift both arms but could  not lift just her right arm and lifted her left instead. EOMI. Conjugate midline gaze. Blinks to threat  bilat. PERRL. Face symmetric.  Tongue midline. Cough and gag intact. Motor system exam reveals: moving all extremities equally, withdraws in all extremities, 1+ reflexes, normal tone, toes downgoing.    Disposition: 01-Home or Self Care  Discharge Instructions    Ambulatory referral to Hematology    Complete by:  As directed    Ambulatory referral to Neurology    Complete by:  As directed    Stroke patient. Dr. Leonie Man prefers follow up in 6 weeks   Ambulatory referral to Occupational Therapy    Complete by:  As directed    Driving Restrictions    Complete by:  As directed    We recommend no driving x 1 week   Increase activity slowly   MEDICATIONS :   Acetaminophen 650 mg q 6 h prn mild pain Albuterol inhaler 1 puff every 6 hours when necessary wheezing or shortness of breath Aspirin 81 mg enteric-coated daily Keppra 500 mg twice daily MOMETASONE-FORMOTEROL 200-5 TO TWICE A DAY  Olmesartan-hydrochlorothiazide 20-12.5 daily  Complete by:  As directed      Allergies as of 11/13/2016      Reactions   Latuda [lurasidone] Other (See Comments)   Made leg muscles twitch (went off this herself in December, 2017)   Tape Rash   EKG leads break out the patient's skin         Follow-up Information    [facility] Follow up.   Specialty:  Rehabilitation Why:  They will contact you for the first appointment.  Contact information: [address] [id number] [place] [phone]       Paytyn Mesta, MD Follow up in 6 week(s).   Specialties:  Neurology, Radiology Why:  stroke clinic. office will call with appt date and time Contact information: [place] [address] [phone]        Annia Belt, MD. Schedule an appointment as soon as possible for a visit.   Specialty:  Oncology Why:  hematology specialist in clots Contact information: [place]  [id number] [phone]         Total time spent on doing discharge summary 35 minutes  Signed: Zakee Deerman 11/13/2016, 1:36 PM

## 2016-11-13 NOTE — Progress Notes (Signed)
Speech Language Pathology Treatment: Cognitive-Linquistic  Patient Details Name: ANIAS Hanson MRN: [id number] DOB: July 17, 1966 Today's Date: 11/13/2016 Time: [id number] SLP Time Calculation (min) (ACUTE ONLY): 9 min  Assessment / Plan / Recommendation Clinical Impression  Skilled treatment session focused on speech language goals. SLP facilitated session by providng discharge information and need to pursue Outpatient ST services to address cognition and language function. Pt agreeable to appeared frustrated by SLP recommendations. Pt able to communicate simple conversation this session about discharge plans that are pending for this afternoon.    HPI HPI: 51 y.o.femalewith history of tobacco and alcohol abuse, pneumonia, obesity, hypertension, hyperlipidemia, bipolar disorder, and a hx of DVTs treated with Xarelto presenting with a 3 day history of headaches and aphasia.Shedidnot receive IV t-PA due to [place] in the left parieto-occipital region. Surrounding efema and mild regional mass effect also noted as well as a little subarachnoid hemorrhage on the contralateral side.      SLP Plan  Continue with current plan of care     Recommendations   Outpatient ST                General recommendations: Rehab consult Oral Care Recommendations: Oral care BID Follow up Recommendations: Outpatient SLP Plan: Continue with current plan of care       GO                Carla Hanson 11/13/2016, 3:26 PM

## 2016-11-13 NOTE — Progress Notes (Addendum)
CM consulted for outpatient therapy. CM met with the patient and asked about outpt therapy. Patient would like to attend at [facility] on [address]. Orders placed in EPIC and information on the AVS.  Pt also without insurance. She states she is seen in the [facility]. She states she gets her medications from the [facility]. She is not sure if we need to fax them her medications or have paper prescriptions. CM attempted to call the health department and they are at lunch. CM will try again.   Addendum: 1324: [facility] and pt has an orange card through them. Medications can be faxed to them and then picked up post d/c.  

## 2016-11-13 NOTE — Progress Notes (Signed)
RN called to room by shaun, RN. Patient states her regular pharmacy, [facility],  will not be open by the time patient leaves to pick up rx for keppra. RN [facility], no answer, VM states they close at 5:00pm. RN notified Dr. Leonie Man. RN is obtaining rx from [facility] neurology so patient may take rx to [facility] pharmacy on [address] closes at 9pm, patient aware of price between 12.00-29.00, given coupon for $8.66. Patient aware she needs to take this medication tonight and tomorrow am, needs to fill whole rx tomorrow. Dr. Cristobal Goldmann called in rx to [facility] off [address].

## 2016-11-13 NOTE — Progress Notes (Signed)
Pt being discharged from hospital per orders from MD. Pt educated on discharge instructions. Pt verbalized understanding of instructions. All questions and concerns were addressed. Pt's IV was removed prior to discharge. Pt exited hospital via wheelchair. 

## 2016-11-13 NOTE — Progress Notes (Signed)
OT Cancellation Note  Patient Details Name: Carla Hanson MRN: [id number] DOB: Mar 15, 1966   Cancelled Treatment:    Reason Eval/Treat Not Completed: Patient declined, no reason specified. Pt reporting that she is leaving this afternoon and will not participate with OT this session. Reviewed benefits of participation with therapy but pt continues to report "I'm just ready to go home." Reinforced education with pt and family concerning safety with ADL post-operatively. OT will check back as able.  [address] OTR/L [id number] 11/13/2016, 4:44 PM

## 2016-11-19 ENCOUNTER — Ambulatory Visit: Payer: Medicaid Other | Admitting: Occupational Therapy

## 2016-11-21 ENCOUNTER — Inpatient Hospital Stay: Payer: Medicaid Other | Admitting: Internal Medicine

## 2016-11-25 ENCOUNTER — Other Ambulatory Visit: Payer: Self-pay | Admitting: *Deleted

## 2016-11-25 DIAGNOSIS — J209 Acute bronchitis, unspecified: Secondary | ICD-10-CM

## 2016-11-25 DIAGNOSIS — J449 Chronic obstructive pulmonary disease, unspecified: Secondary | ICD-10-CM

## 2016-11-25 MED ORDER — MOMETASONE FURO-FORMOTEROL FUM 200-5 MCG/ACT IN AERO: 2.0000 | INHALATION_SPRAY | Freq: Two times a day (BID) | RESPIRATORY_TRACT | 1 refills | Status: DC

## 2016-11-26 ENCOUNTER — Ambulatory Visit (INDEPENDENT_AMBULATORY_CARE_PROVIDER_SITE_OTHER): Payer: Self-pay | Admitting: Internal Medicine

## 2016-11-26 ENCOUNTER — Encounter: Payer: Self-pay | Admitting: Internal Medicine

## 2016-11-26 ENCOUNTER — Encounter: Payer: Medicaid Other | Admitting: Occupational Therapy

## 2016-11-26 DIAGNOSIS — I10 Essential (primary) hypertension: Secondary | ICD-10-CM

## 2016-11-26 MED ORDER — LOSARTAN POTASSIUM-HCTZ 50-12.5 MG PO TABS: 1.0000 | ORAL_TABLET | Freq: Every day | ORAL | 2 refills | Status: DC

## 2016-11-26 NOTE — Patient Instructions (Signed)
It was nice meeting you today [name]!  Please STOP taking Benicar, and start taking Hyzaar instead. You will continue to take one tablet a day as with the other medication.   Please be sure to follow up with your neurologist as advised.   If you have any questions or concerns, please feel free to call the clinic.   Be well,  Dr. Avon Gully

## 2016-11-26 NOTE — Assessment & Plan Note (Signed)
BP well-controlled today at 130/64. Cr 0.7 two weeks ago.  Reporting dizziness since beginning Benicar, and would like to change today. Per records, patient previously on lisinopril-HCTZ, although it is unclear why this was discontinued and patient unsure if she developed a cough or not. As such, will not resume lisinopril-HCTZ today due to possible cough. Patient's description of symptoms does not sound consistent with orthostatic hypotension causing dizziness, as symptoms are described as worst when she is laying down, so doubt hypotension as cause of dizziness. Will switch to losartan-HCTZ. Patient to f/u if symptoms persist and will consider switching back to lisinopril-HCTZ at that time.

## 2016-11-26 NOTE — Progress Notes (Signed)
   Subjective:   Patient: Carla Hanson       Birthdate: [birth year]       MRN: [id number]      HPI  Carla Hanson is a 51 y.o. female presenting for hospital f/u.   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 Patient presented with headaches and aphasia x3d, at which point she was diagnosed with ICH. During her admission, she had a witnessed seizure. EEG confirmed seizure activity. She was subsequently started on Keppra. Due to [place], Xarelto was discontinued, and patient was discharged with aspirin alone.  Patient reports no further headaches, difficulty speaking, or seizures since discharge two weeks ago. She is taking Keppra and aspirin as prescribed. Reports some sleepiness with Keppra, which she is going to discuss with her neurologist (Dr. Beryle Beams) at her appointment on 1/30. Otherwise she has no complaints regarding her new medications.  Patient is complaining of dizziness which she attributes to Benicar. She says she has had dizziness since beginning this medication in 11/17. She says the dizziness is worst when laying down. Does not report worsening dizziness with sitting or standing. Cannot say whether it feels like the room is spinning or she is about to fall over. Patient is interested in beginning a new BP med today. She has previously been on lisinopril-HCTZ, but cannot remember why this was discontinued. She thinks she may have developed a cough but says she has been having difficulty remembering things since her stroke. She was also previously on Norvasc but says she needs to be on a combo med with HCTZ.   Smoking status reviewed. Patient is a former smoker.   Review of Systems See HPI.     Objective:  Physical Exam  Constitutional: She is oriented to person, place, and time and well-developed, well-nourished, and in no distress.  HENT:  Head: Normocephalic and atraumatic.  Eyes: Conjunctivae and EOM are normal.  Right eye exhibits no discharge. Left eye exhibits no discharge.  Pulmonary/Chest: Effort normal. No respiratory distress.  Neurological: She is alert and oriented to person, place, and time. Gait normal.  Slight resting tremor  Psychiatric: Affect and judgment normal.     Assessment & Plan:  HYPERTENSION, BENIGN SYSTEMIC BP well-controlled today at 130/64. Cr 0.7 two weeks ago.  Reporting dizziness since beginning Benicar, and would like to change today. Per records, patient previously on lisinopril-HCTZ, although it is unclear why this was discontinued and patient unsure if she developed a cough or not. As such, will not resume lisinopril-HCTZ today due to possible cough. Patient's description of symptoms does not sound consistent with orthostatic hypotension causing dizziness, as symptoms are described as worst when she is laying down, so doubt hypotension as cause of dizziness. Will switch to losartan-HCTZ. Patient to f/u if symptoms persist and will consider switching back to lisinopril-HCTZ at that time.   Adin Hector, MD, MPH PGY-2 [facility] Pager [phone]

## 2016-11-28 ENCOUNTER — Ambulatory Visit: Payer: Self-pay | Attending: Neurology | Admitting: Occupational Therapy

## 2016-11-28 DIAGNOSIS — I69115 Cognitive social or emotional deficit following nontraumatic intracerebral hemorrhage: Secondary | ICD-10-CM | POA: Insufficient documentation

## 2016-11-28 DIAGNOSIS — R41844 Frontal lobe and executive function deficit: Secondary | ICD-10-CM | POA: Insufficient documentation

## 2016-11-28 NOTE — Therapy (Addendum)
[facility] [address] [place], [place], [address] Phone: [phone]   Fax:  [phone]  Occupational Therapy Evaluation  Patient Details  Name: Carla Hanson MRN: [id number] Date of Birth: [birth year] Referring Provider: Dr. Leonie Man  Encounter Date: 11/28/2016      OT End of Session - 11/28/16 1542    Visit Number 1   Number of Visits 17   Date for OT Re-Evaluation 01/25/17   Authorization Type self pay   OT Start Time 1318   OT Stop Time 1400   OT Time Calculation (min) 42 min   Activity Tolerance Patient tolerated treatment well   Behavior During Therapy [facility] for tasks assessed/performed      Past Medical History:  Diagnosis Date  . Anxiety   . Bipolar disorder ([place])   . Hyperlipidemia   . Hypertension   . Lipoma    Abdomen  . LIPOMA 01/20/2008  . Obesity   . Pneumonia   . Tobacco abuse     Past Surgical History:  Procedure Laterality Date  . CESAREAN SECTION    . lipoma removed  03/2011  . ORIF ANKLE FRACTURE  03/13/2012   Procedure: OPEN REDUCTION INTERNAL FIXATION (ORIF) ANKLE FRACTURE;  Surgeon: Jessy Oto, MD;  Location: [facility];  Service: Orthopedics;  Laterality: Right;    There were no vitals filed for this visit.      Subjective Assessment - 11/28/16 1321    Subjective  Pt with hx of bipolar d/o presents  ICH with seizures   Pertinent History ICH with seizures, Bipolar, see epic   Patient Stated Goals compensate for memory changes   Currently in Pain? No/denies           [facility] OT Assessment - 11/28/16 1323      Assessment   Diagnosis ICH with seizure   Referring Provider Dr. Leonie Man   Onset Date 11/08/16   Prior Therapy acute OT     Precautions   Precaution Comments memory deficits     Balance Screen   Has the patient fallen in the past 6 months No   Has the patient had a decrease in activity level because of a fear of falling?  No     Home  Environment    Family/patient expects to be discharged to: Private residence   Type of [place] Access Level entry   [place] Shower/Tub Tub/Shower unit   Lives With Family  pt lived alone prior to CVA     Prior Function   Level of Independence Independent with basic ADLs;Independent with homemaking with ambulation  Pt does not drive   Vocation Unemployed  has applied for disability   Leisure listen to [employer], watch TV, cooking     ADL   Eating/Feeding Independent   Grooming Modified independent   Upper Body Bathing Independent   Lower Body Bathing Independent   Upper Body Dressing Independent   Lower Body Dressing Independent   Toilet Tranfer Independent   Tub/Shower Transfer Independent  grab bar     IADL   Shopping Needs to be accompanied on any shopping trip  family carries gorceries into house   Meal Prep --  needs assist with cooking, burned food this a.m.   Medication Management Is responsible for taking medication in correct dosages at correct time   Financial Management Requires assistance     Mobility   Mobility Status Independent     Written Expression  Handwriting 100% legible     Vision Assessment   Vision Assessment Vision impaired  _ to be further tested in functional context     Cognition   Overall Cognitive Status Impaired/Different from baseline   Area of Impairment Attention;Memory;Following commands;Safety/judgement;Problem solving   Current Attention Level Sustained   Memory Decreased short-term memory   Safety/Judgement Decreased awareness of safety;Decreased awareness of deficits   Attention Sustained  unable to correctly complete clock drawing without cues   Behaviors Poor frustration tolerance;Restless  did not complete trailmaking 2 due to attention/ poor  Frustration tolerance      Sensation   Light Touch Not tested     Coordination   Gross Motor Movements are Fluid and Coordinated --   Fine Motor Movements are Fluid and Coordinated --    9 Hole Peg Test Right;Left   Right 9 Hole Peg Test 21.72 secs   Left 9 Hole Peg Test 23.25 secs   Tremors bilateral hands     ROM / Strength   AROM / PROM / Strength AROM;Strength     AROM   Overall AROM  Within functional limits for tasks performed   Overall AROM Comments tremor noted bilaterally     Strength   Overall Strength Within functional limits for tasks performed                           OT Short Term Goals - 11/28/16 1544      OT SHORT TERM GOAL #1   Title Pt will verbalize understanding of memory and cognitive compensation strategies.   Time 4   Period Weeks   Status New     OT SHORT TERM GOAL #2   Title Pt will demonstrate ability to perform basic cooking with supervision/ min v.c.   Time 4   Period Weeks   Status New     OT SHORT TERM GOAL #3   Title Pt will demonstrate ability to perform basic financial task such as change making at a modified independent level   Time 4   Period Weeks   Status New           OT Long Term Goals - 11/28/16 1543      OT LONG TERM GOAL #1   Title Pt will demonstrate selective attention and sufficent frustration tolerance in order to  to complete a functional organization/ problem solving task  x15 mins  in a busy environment   Time 8   Period Weeks   Status New     OT LONG TERM GOAL #2   Title Pt will demonstrate ability to perform basic cooking at a modified independent level demonstrating good safety awareness.   Time 8   Period Weeks   Status New     OT LONG TERM GOAL #3   Title Pt will report she has resumed performance of home management activities at a modiifed independent level.   Time 4   Period Weeks   Status New               Plan - 11/28/16 1535    Clinical Impression Statement Pt is a 51 y.o female hospitalized 11/08/16-11/13/16 for ICH and seizure. Pt has a PMH significant for bipolar disorder, HTN, hyperlipidemia, DVT treated with Xarelto. Pt presents with cognitive  deficits and aphasia which impdes pts safety and independence with ADLs/ IADLs. Pt can benefit from skilled occupational therapy to maximize safety and independence with ADLs/  IADLs.    Rehab Potential Fair   OT Frequency 2x / week  plus eval, pt requests 1 x week initally   OT Duration 8 weeks   OT Treatment/Interventions Self-care/ADL training;DME and/or AE instruction;Patient/family education;Therapeutic activities;Cognitive remediation/compensation;Neuromuscular education;Cryotherapy;Energy conservation;Manual Therapy;Visual/perceptual remediation/compensation   Plan memory compensation strategies   Consulted and Agree with Plan of Care Patient      Patient will benefit from skilled therapeutic intervention in order to improve the following deficits and impairments:  Impaired vision/preception, Decreased cognition, Decreased activity tolerance, Decreased safety awareness, Decreased knowledge of use of DME  Visit Diagnosis: 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 Seizure ([place])   . ICH (intracerebral hemorrhage) ([place]) 11/08/2016  . CAP (community acquired pneumonia) 10/31/2016  . Dyspnea 10/31/2016  . Encounter for weight loss counseling 06/09/2016  . Tobacco abuse 05/10/2016  . Chronic obstructive pulmonary disease (COPD) ([place]) 05/10/2016  . RUQ abdominal pain 12/29/2013  . Left leg DVT ([employer]) 09/29/2013  . Pre-diabetes 03/02/2013  . History of pulmonary embolism 05/05/2012  . Painful menstrual periods 01/05/2012  . Bipolar disorder ([place]) 07/03/2010  . History of tobacco abuse 08/23/2009  . Hyperlipidemia 04/30/2007  . OBESITY, NOS 01/01/2007  . HYPERTENSION, BENIGN SYSTEMIC 01/01/2007    Khloie [place] 11/28/2016, 4:38 PM Theone Murdoch,  OTR/L [phone] Phone: [phone] 4:38 PM 01/25/18[name] [address] [place], [place], [address] Phone: [phone]   Fax:  [phone]  Name: ELLIZABETH NUTTLE MRN: [id number] Date of Birth: [birth year]

## 2016-11-29 ENCOUNTER — Telehealth: Payer: Self-pay | Admitting: Occupational Therapy

## 2016-11-29 ENCOUNTER — Other Ambulatory Visit: Payer: Self-pay | Admitting: *Deleted

## 2016-11-29 ENCOUNTER — Other Ambulatory Visit: Payer: Self-pay | Admitting: Neurology

## 2016-11-29 DIAGNOSIS — R4701 Aphasia: Secondary | ICD-10-CM

## 2016-11-29 NOTE — Telephone Encounter (Signed)
Dr. Leonie Man, Carla Hanson was seen for an occupational therapy evaluation. She reports word finding and expressive difficulties as well as cognitive deficits. Speech therapy was recommended by acute care therapist.  Pt can benefit from ST to address aphasia and cognitive deficits. If you agree please make a referral. Sincerely,  Theone Murdoch, OTR/L

## 2016-11-29 NOTE — Telephone Encounter (Signed)
Thanknyou and I will order ST

## 2016-11-29 NOTE — Patient Outreach (Signed)
[facility]) Care Management  11/29/2016  Carla Hanson Apr 19, 1966 [id number]  EMMI-Stroke referral for red dashboard for follow up appointment:  Telephone call to patient who was advised of reason for call. HIPPA verification given by patient.   Patient voices that she does have some memory problems at times since having stroke. States she is currently living with her mother while she recuperates. Voices that she is getting electronic calls and that she has had follow up appointment with Dr. Avon Gully at [facility] clinic 1/23. States she will see Dr. Ola Spurr at [facility] 12/09/16. Voices that her mother & sister takes her to MD appointments.  States she has also had appointment with outpatient rehabilitation yesterday 1/25.    Patient voices that she is taking medications as prescribed by her doctors. States she was having some dizziness and has discussed this with her MD at follow up visit. Patient was advised of falls prevention and voices understanding. Also reviewed stroke symptoms & action plan & patient voices understanding. Marland Kitchen  Re-enforcement of other follow up visits reviewed with patient. Patient was able to write down specialist names with phone numbers & addresses for neurologist & hematologist.. Patient states she will call to verify appointments.  Emmi referral has been addressed.  Plan; Send EMMI-educational information on stroke symptoms. Case case.     Sherrin Daisy, RN BSN [place] Management Coordinator [facility] Care Management  [phone]

## 2016-12-02 ENCOUNTER — Telehealth: Payer: Self-pay | Admitting: Neurology

## 2016-12-02 ENCOUNTER — Encounter: Payer: Self-pay | Admitting: *Deleted

## 2016-12-02 NOTE — Telephone Encounter (Signed)
Pt called to schedule appt. Says her mother called in before,talked with someone reg pt does not have insurance and was told she could bring $10 copay. Pt did not know who her mother talked with. Pt said she has applied for [place] financial assistance, it is in the beginning stages. I contacted Angie via [place] and she ok'd pt could bring $10 for copay.  FYI

## 2016-12-03 ENCOUNTER — Encounter: Payer: Self-pay | Admitting: Oncology

## 2016-12-03 ENCOUNTER — Ambulatory Visit (INDEPENDENT_AMBULATORY_CARE_PROVIDER_SITE_OTHER): Payer: Self-pay | Admitting: Oncology

## 2016-12-03 VITALS — BP 127/74 | HR 68 | Temp 97.9°F | Ht 64.0 in | Wt 309.5 lb

## 2016-12-03 DIAGNOSIS — I825Y2 Chronic embolism and thrombosis of unspecified deep veins of left proximal lower extremity: Secondary | ICD-10-CM

## 2016-12-03 DIAGNOSIS — Z8249 Family history of ischemic heart disease and other diseases of the circulatory system: Secondary | ICD-10-CM

## 2016-12-03 DIAGNOSIS — Z825 Family history of asthma and other chronic lower respiratory diseases: Secondary | ICD-10-CM

## 2016-12-03 DIAGNOSIS — I825Y3 Chronic embolism and thrombosis of unspecified deep veins of proximal lower extremity, bilateral: Secondary | ICD-10-CM

## 2016-12-03 DIAGNOSIS — Z8781 Personal history of (healed) traumatic fracture: Secondary | ICD-10-CM

## 2016-12-03 DIAGNOSIS — I825Y1 Chronic embolism and thrombosis of unspecified deep veins of right proximal lower extremity: Secondary | ICD-10-CM

## 2016-12-03 DIAGNOSIS — Z6841 Body Mass Index (BMI) 40.0 and over, adult: Secondary | ICD-10-CM

## 2016-12-03 DIAGNOSIS — H538 Other visual disturbances: Secondary | ICD-10-CM

## 2016-12-03 DIAGNOSIS — Z91048 Other nonmedicinal substance allergy status: Secondary | ICD-10-CM

## 2016-12-03 DIAGNOSIS — Z87891 Personal history of nicotine dependence: Secondary | ICD-10-CM

## 2016-12-03 DIAGNOSIS — I69098 Other sequelae following nontraumatic subarachnoid hemorrhage: Secondary | ICD-10-CM

## 2016-12-03 DIAGNOSIS — Z888 Allergy status to other drugs, medicaments and biological substances status: Secondary | ICD-10-CM

## 2016-12-03 DIAGNOSIS — I611 Nontraumatic intracerebral hemorrhage in hemisphere, cortical: Secondary | ICD-10-CM

## 2016-12-03 DIAGNOSIS — Z86711 Personal history of pulmonary embolism: Secondary | ICD-10-CM

## 2016-12-03 DIAGNOSIS — D509 Iron deficiency anemia, unspecified: Secondary | ICD-10-CM

## 2016-12-03 NOTE — Progress Notes (Signed)
New Patient Hematology   Carla Hanson [id number] 01/11/1966 51 y.o. 12/03/2016  CC: Dr. Olene Floss; Dr. 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 Louanne Skye. She states she was laid up for about 3 months. On 05/04/2012 she sustained a proximal right lower extremity DVT and bilateral pulmonary emboli. She was anticoagulated for a length of time but doesn't remember how long. On 09/21/2013 she developed acute pain in her left calf and was found to have an acute popliteal and posterior tibial vein thrombosis and put back on anticoagulation, most recently with Xarelto. A follow-up study done 04/03/2015 for recurrent symptoms was negative. However, a study done on 04/17/2016 again showed acute proximal left lower extremity DVT involving the popliteal and peroneal veins. A recent screening study  done on 11/09/2016 was negative.  She was admitted to the hospital on 10/31/2016 with cough and dyspnea. In view of her history, a CT angiogram was done of the chest on December 28 which did not show any pulmonary emboli. There were multifocal areas of pneumonia right greater than left. She was treated with antibiotics with IV Levaquin and discharged on December 31 to continue a course of oral Levaquin.  She was readmitted to the hospital on 11/08/2016 when she presented with expressive aphasia, headache, and change in vision and was found to have a hemorrhagic stroke with subarachnoid hemorrhage primarily in the left temporal occipital region with additional small amount of hemorrhage in the upper right frontal region. She had a witnessed seizure on the day of  admission and was started on Keppra. Anticoagulation obviously stopped. No evidence of cerebral venous sinus thrombosis on MR venogram. Transthoracic echocardiogram with normal left ventricular function and grade 1 diastolic dysfunction. Follow-up CT scan showed no further hemorrhage. She was discharged on January 10 on 81 mg of aspirin daily.  She has no signs or symptoms of a collagen vascular disorder. She had a limited hematology lab evaluation in the past and was negative for the factor V Leiden gene mutation. She had normal protein S and protein C levels all done 12/29/2013. She is HIV negative and RPR negative (February 2016).  She is up-to-date on her health maintenance exams. She just turned 50 and has not had a colonoscopy yet. She states that she gets mammograms on a regular basis but the last recorded test was done in May 2014. She had a negative Pap smear in February 2016. She has a chronic iron deficiency anemia documented at least back to 2011. At time of the 10/31/2016 admission hemoglobin 10.8, MCV 71.7. She denied any hematuria or hematochezia. She states that her menstrual cycles have been regular. She stopped smoking and using alcohol many years ago.  There is no family history of blood clots. Her mother is alive and well. Father died in his 72s of a heart attack. She has a brother and 2 sisters all younger than her who have not had any clotting problems.  She has no constitutional symptoms. Weight is steady. She has some residual blurred vision in her left eye since her recent hemorrhagic stroke. No significant headache. She is still having problems with memory and word finding. She denies any polyarthralgia or  polymyalgia. She does have excessive facial hair growth. She denies any change in bowel habit. No current dyspnea, chest pain, palpitations, calf pain or swelling.     PMH: Past Medical History:  Diagnosis Date  . Anxiety   . Bipolar disorder ([place])   . Hyperlipidemia    . Hypertension   . Lipoma    Abdomen    01/20/2008  . Obesity   . Pneumonia December, 2017        No history of diabetes, ulcers, hepatitis, yellow jaundice, any disease, liver disease, inflammatory arthritis. No thyroid disease.  Past Surgical History:  Procedure Laterality Date  . CESAREAN SECTION    . lipoma removed  03/2011  . ORIF ANKLE FRACTURE  03/13/2012   Procedure: OPEN REDUCTION INTERNAL FIXATION (ORIF) ANKLE FRACTURE;  Surgeon: Jessy Oto, MD;  Location: [facility];  Service: Orthopedics;  Laterality: Right;  Cyst removed from the mid neck.  Allergies: Allergies  Allergen Reactions  . Latuda [Lurasidone] Other (See Comments)    Made leg muscles twitch (went off this herself in December, 2017)  . Tape Rash    EKG leads break out the patient's skin    Medications:  Current Outpatient Prescriptions:  .  acetaminophen (TYLENOL) 325 MG tablet, Take 650 mg by mouth every 6 (six) hours as needed for mild pain., Disp: , Rfl:  .  albuterol (PROVENTIL HFA;VENTOLIN HFA) 108 (90 Base) MCG/ACT inhaler, Inhale 1 puff into the lungs every 6 (six) hours as needed for wheezing or shortness of breath., Disp: 6.7 g, Rfl: 1 .  aspirin EC 81 MG EC tablet, Take 1 tablet (81 mg total) by mouth daily., Disp: 30 tablet, Rfl: 12 .  levETIRAcetam (KEPPRA) 500 MG tablet, Take 1 tablet (500 mg total) by mouth 2 (two) times daily., Disp: 60 tablet, Rfl: 2 .  losartan-hydrochlorothiazide (HYZAAR) 50-12.5 MG tablet, Take 1 tablet by mouth daily., Disp: 30 tablet, Rfl: 2 .  mometasone-formoterol (DULERA) 200-5 MCG/ACT AERO, Inhale 2 puffs into the lungs 2 (two) times daily., Disp: 1 Inhaler, Rfl: 1  Social History: Single female, separated, for children's, son in his 17s who is the oldest, 3 daughters  reports that she has quit smoking. Her smoking use included Cigarettes. She started smoking about 35 years ago. She has a 9.00 pack-year smoking history. She has never used smokeless tobacco. She  reports that she does not drink alcohol or use drugs.  Family History: Family History  Problem Relation Age of Onset  . Asthma Father     Review of Systems: See history of present illness. Review of systems otherwise negative.   Physical Exam: Blood pressure 127/74, pulse 68, temperature 97.9 F (36.6 C), temperature source Oral, height 5\' 4"  (1.626 m), weight (!) 309 lb 8 oz (140.4 kg), last menstrual period 11/11/2016, SpO2 100 %. Wt Readings from Last 3 Encounters:  12/03/16 (!) 309 lb 8 oz (140.4 kg)  11/26/16 (!) 303 lb (137.4 kg)  11/11/16 292 lb 6.4 oz (132.6 kg)     General appearance: Anxious, obese, African-American woman HENNT: Increased hair growth on the upper lip and chin. Pharynx no erythema, exudate, mass, or ulcer. No thyromegaly or thyroid nodules Lymph nodes: No cervical, supraclavicular, or axillary lymphadenopathy Breasts:  Lungs: Clear to auscultation, resonant to percussion throughout Heart: Regular rhythm, no murmur, no gallop, no rub, no click, no edema Abdomen: Soft, obese, nontender, normal bowel sounds, no mass, no organomegaly Extremities: No edema, no calf tenderness Musculoskeletal: no joint deformities GU:  Vascular: Carotid pulses 2+, no bruits,  Neurologic: Alert, oriented, memory poor but speech fluent, no aphasia, PERRLA, I was unable to get a good look at her fundi, cranial nerves grossly normal, motor strength 5 over 5, reflexes 1+ symmetric, upper body coordination normal, finger to finger, rapid alternating movements, normal, gait normal, Skin: No rash or ecchymosis    Lab Results: Lab Results  Component Value Date   WBC 12.0 (H) 11/10/2016   HGB 10.3 (L) 11/10/2016   HCT 33.0 (L) 11/10/2016   MCV 72.4 (L) 11/10/2016   PLT 385 11/10/2016     Chemistry      Component Value Date/Time   NA 139 11/10/2016 0230   K 3.6 11/10/2016 0230   CL 108 11/10/2016 0230   CO2 23 11/10/2016 0230   BUN 10 11/10/2016 0230   CREATININE 0.74  11/10/2016 0230   CREATININE 0.57 11/09/2014 1032      Component Value Date/Time   CALCIUM 8.3 (L) 11/10/2016 0230   ALKPHOS 32 (L) 11/10/2016 0230   AST 17 11/10/2016 0230   ALT 17 11/10/2016 0230   BILITOT 0.7 11/10/2016 0230        Impression: Xarelto related spontaneous subarachnoid hemorrhage.  Her first right lower extremity DVT and pulmonary emboli were  provoked due to major orthopedic surgery and immobilization. Subsequent recurrent left lower extremity DVTs are harder to explain. Only obvious risk factor is her weight. She has had a microcytic anemia documented at least since April 2011. This has never been fully worked up. It is likely due to menstrual losses. If she had an occult malignancy it would have become obvious by now. I will obtain iron studies today. Urine analysis done on December 28 and January 5 negative for blood. Consider colonoscopy now that she has turned 51 years old. She has had a negative recent Pap smear.  Decision whether or not to go back on an anticoagulant is difficult given the unprovoked, recurrent nature, of the left lower extremity DVTs. I do think doing a more expanded hypercoagulation laboratory workup might be helpful in making a decision. If she is found to have a strong thrombophilia I would be inclined to put her back on anticoagulation. I would favor Coumadin over the newer oral anticoagulants since we would have the ability to monitor PT/INR and keep her low therapeutic. She is understandably anxious about going back on a blood thinner and became very tearful when we discussed this possibility. She wants to know if there was less risk for a intracranial bleed on Coumadin but I had to be honest and tell her that this would still be a possibility. Another consideration would be to go back on a dose reduced anticoagulant. There are now 2 studies in the literature the first with apixiban and the second with Xarelto that show equivalent protection  against recurrent thrombosis with decreased bleeding risk at a 50% dose reduction after an initial interval of 6-12 months of full anticoagulant dose. I would not be in favor of a vena cava filter if she has a general hypercoagulable state since it would not protect her from spontaneous thrombosis other than pulmonary emboli originating from her leg veins.  Recommendation: She is encouraged to continue low-dose aspirin at this time. This will afford her some protection against recurrent thrombosis without an excessive risk for bleeding although we have also have learned from the clinical trials that aspirin is not totally benign with respect to bleeding risk. I'm getting some additional special  hematology laboratory studies today and will see her back again in 2-3 weeks for further discussion. Unless the studies show a very strong inherited or acquired thrombophilia risk, I will just keep her on the aspirin unless and until she has another thrombotic event.         Murriel Hopper, MD, [place]  Hematology-Oncology/Internal Medicine  12/03/2016, 5:54 PM

## 2016-12-03 NOTE — Patient Instructions (Addendum)
To lab today Return visit 2-3 weeks to review results Stay on 81 mg aspirin one daily

## 2016-12-04 LAB — LUPUS ANTICOAGULANT PANEL
DRVVT: 34.8 s (ref 0.0–47.0)
PTT LA: 29 s (ref 0.0–51.9)

## 2016-12-04 LAB — CARDIOLIPIN ANTIBODIES, IGG, IGM, IGA
Anticardiolipin IgA: <9 APL U/mL (ref 0–11)
Anticardiolipin IgG: <9 GPL U/mL (ref 0–14)
Anticardiolipin IgM: <9 MPL U/mL (ref 0–12)

## 2016-12-04 LAB — CBC WITH DIFFERENTIAL/PLATELET
BASOS ABS: 0 10*3/uL (ref 0.0–0.2)
Basos: 0 %
EOS (ABSOLUTE): 0.2 10*3/uL (ref 0.0–0.4)
EOS: 2 %
HEMATOCRIT: 33.9 % — AB (ref 34.0–46.6)
Hemoglobin: 10.3 g/dL — ABNORMAL LOW (ref 11.1–15.9)
IMMATURE GRANULOCYTES: 0 %
Immature Grans (Abs): 0 10*3/uL (ref 0.0–0.1)
LYMPHS ABS: 2.5 10*3/uL (ref 0.7–3.1)
Lymphs: 26 %
MCH: 22.2 pg — ABNORMAL LOW (ref 26.6–33.0)
MCHC: 30.4 g/dL — AB (ref 31.5–35.7)
MCV: 73 fL — ABNORMAL LOW (ref 79–97)
MONOS ABS: 0.6 10*3/uL (ref 0.1–0.9)
Monocytes: 7 %
NEUTROS PCT: 65 %
Neutrophils Absolute: 6.3 10*3/uL (ref 1.4–7.0)
Platelets: 455 10*3/uL — ABNORMAL HIGH (ref 150–379)
RBC: 4.65 x10E6/uL (ref 3.77–5.28)
RDW: 18.6 % — AB (ref 12.3–15.4)
WBC: 9.6 10*3/uL (ref 3.4–10.8)

## 2016-12-04 LAB — BETA-2-GLYCOPROTEIN I ABS, IGG/M/A
Beta-2 Glyco 1 IgA: <9 GPI IgA units (ref 0–25)
Beta-2 Glyco 1 IgM: <9 GPI IgM units (ref 0–32)

## 2016-12-04 LAB — IRON AND TIBC
IRON: 32 ug/dL (ref 27–159)
Iron Saturation: 9 % — CL (ref 15–55)
TIBC: 363 ug/dL (ref 250–450)
UIBC: 331 ug/dL (ref 131–425)

## 2016-12-04 LAB — ANTITHROMBIN III: ANTITHROMB III FUNC: 106 % (ref 75–135)

## 2016-12-04 LAB — FERRITIN: Ferritin: 12 ng/mL — ABNORMAL LOW (ref 15–150)

## 2016-12-04 LAB — SEDIMENTATION RATE: SED RATE: 49 mm/h — AB (ref 0–40)

## 2016-12-09 ENCOUNTER — Ambulatory Visit (INDEPENDENT_AMBULATORY_CARE_PROVIDER_SITE_OTHER): Payer: Self-pay | Admitting: Internal Medicine

## 2016-12-09 ENCOUNTER — Encounter: Payer: Self-pay | Admitting: Internal Medicine

## 2016-12-09 VITALS — BP 118/70 | HR 85 | Temp 97.9°F | Ht 64.0 in | Wt 305.6 lb

## 2016-12-09 DIAGNOSIS — D649 Anemia, unspecified: Secondary | ICD-10-CM

## 2016-12-09 DIAGNOSIS — D509 Iron deficiency anemia, unspecified: Secondary | ICD-10-CM

## 2016-12-09 DIAGNOSIS — I10 Essential (primary) hypertension: Secondary | ICD-10-CM

## 2016-12-09 LAB — PROTHROMBIN GENE MUTATION

## 2016-12-09 MED ORDER — LOSARTAN POTASSIUM-HCTZ 50-12.5 MG PO TABS: 1.0000 | ORAL_TABLET | Freq: Every day | ORAL | 2 refills | Status: DC

## 2016-12-09 NOTE — Patient Instructions (Signed)
Ms. Matchette,  I have refilled your losartan-HCTZ.  Please take iron supplements (325 mg) daily.  Please either bring back or mail the stool card to check for blood.  You are due for TDAP. The cheapest place to have this done is the [facility].  Best, Dr. Ola Spurr

## 2016-12-09 NOTE — Progress Notes (Signed)
[facility] Progress Note  Subjective:  Carla Hanson is a 51 y.o. female who presents for HTN medication refill. PMH significant for HLD, HTN, recurrent PE/DVT. She was also hospitalized last month for [facility].   HTN: - Patient was switched from lisinopril-HCTZ to benicar last fall because the former is not covered by Medication Assistance Program. She was having a lot of dizziness, so she was prescribed losartan-HCTZ instead, which she says has been working well. - Would like to continue hyzaar - Last SCr 0.74 11/10/16 ROS: No headaches, has sleepiness she attributes to Keppra  Iron Deficiency Anemia: - Low hemoglobin at 10.3, low MCV at 73 - Low ferritin, low iron saturation (9), borderline low iron (32) - History of very heavy periods, but patient says this has improved since stopping xarelto due to her recent hemorrhagic stroke. This month's period was much lighter than in the past.  - Patient wary to start supplementation, as she is afraid of side effects any medication since having stroke on xarelto  Objective: Blood pressure 118/70, pulse 85, temperature 97.9 F (36.6 C), temperature source Oral, height 5\' 4"  (1.626 m), weight (!) 305 lb 9.6 oz (138.6 kg), last menstrual period 11/11/2016, SpO2 96 %. Body mass index is 52.46 kg/m. Constitutional: Morbidly obese female, in NAD Cardiovascular: RRR, S1, S2, no m/r/g.  Neurological: AOx3, speech normal, postural tremor bilaterally (previously noted) Psychiatric: Somewhat anxious affect Vitals reviewed  Assessment/Plan: HYPERTENSION, BENIGN SYSTEMIC - At BP goal on hyzaar 50-12.5 mg - Ordered refills - Recheck electrolytes with health maintenance exams  Iron deficiency anemia - Recommended supplementation. Patient preferred to buy iron OTC rather than have prescription.  - Suspect source has been heavy menstrual bleeding and will improve now that patient has stopped xarelto - Advised trying miralax if she  experiences constipation  Health Maintenance: Provided handout on mammogram. Provided stool cards, as patient declines colonoscopy. [facility] without insurance.   Follow-up in next 1-2 months for general check-up, including Hgb A1c.  Olene Floss, MD [place], PGY-2

## 2016-12-10 ENCOUNTER — Telehealth: Payer: Self-pay | Admitting: Psychology

## 2016-12-10 ENCOUNTER — Telehealth: Payer: Self-pay | Admitting: *Deleted

## 2016-12-10 NOTE — Telephone Encounter (Signed)
She can reschedule for April or May - I want to see how she responds to iron. CBC, Ferritin 1 week before visit.

## 2016-12-10 NOTE — Telephone Encounter (Signed)
Pt called / informed "all the tests we did to look for a possible inherited reason for her blood clot came back negative." per Dr Beryle Beams. She wants to know should she keep her appt Monday the 12th?

## 2016-12-10 NOTE — Telephone Encounter (Signed)
-----   Message from Annia Belt, MD sent at 12/09/2016  7:45 PM EST ----- Call pt: all the tests we did to lok for a possible inherited reason for her blood clot came back negative.

## 2016-12-10 NOTE — Telephone Encounter (Signed)
Patient called to schedule a follow-up.  She let me know she was hospitalized with pneumonia and then a stroke.  Discussed.  She states she has stopped smoking and drinking.  Is feeling better.  Speech is affected.  Doing therapy at home.    She has many doctor's appointments and difficulty with transportation.  She is following up with Dr. Ola Spurr on 3/5.  [facility].  Could not get appointments on the same day secondary to schedules.  She said she would discuss with her mother and let me know.

## 2016-12-11 NOTE — Telephone Encounter (Signed)
Pt called / informed  "She can reschedule for April or May - I want to see how she responds to iron. CBC, Ferritin 1 week before visit." per Dr Beryle Beams. I sent message to Susette Racer to re-schedule appt ; pt awared.

## 2016-12-12 DIAGNOSIS — D509 Iron deficiency anemia, unspecified: Secondary | ICD-10-CM | POA: Insufficient documentation

## 2016-12-12 NOTE — Assessment & Plan Note (Signed)
-   Recommended supplementation. Patient preferred to buy iron OTC rather than have prescription.  - Suspect source has been heavy menstrual bleeding and will improve now that patient has stopped xarelto - Advised trying miralax if she experiences constipation

## 2016-12-12 NOTE — Assessment & Plan Note (Signed)
-   At BP goal on hyzaar 50-12.5 mg - Ordered refills - Recheck electrolytes with health maintenance exams

## 2016-12-13 ENCOUNTER — Ambulatory Visit: Payer: Self-pay | Attending: Neurology | Admitting: Occupational Therapy

## 2016-12-13 DIAGNOSIS — I69115 Cognitive social or emotional deficit following nontraumatic intracerebral hemorrhage: Secondary | ICD-10-CM | POA: Insufficient documentation

## 2016-12-13 DIAGNOSIS — R41844 Frontal lobe and executive function deficit: Secondary | ICD-10-CM | POA: Insufficient documentation

## 2016-12-13 DIAGNOSIS — R41841 Cognitive communication deficit: Secondary | ICD-10-CM | POA: Insufficient documentation

## 2016-12-13 DIAGNOSIS — R2681 Unsteadiness on feet: Secondary | ICD-10-CM | POA: Insufficient documentation

## 2016-12-13 DIAGNOSIS — M6281 Muscle weakness (generalized): Secondary | ICD-10-CM | POA: Insufficient documentation

## 2016-12-13 NOTE — Therapy (Signed)
[facility] [address] [place], [place], [address] Phone: [phone]   Fax:  [phone]  Occupational Therapy Treatment  Patient Details  Name: Carla Hanson MRN: [id number] Date of Birth: [birth year] Referring Provider: Dr. Leonie Man  Encounter Date: 12/13/2016      OT End of Session - 12/13/16 1229    Visit Number 2   Number of Visits 17   Date for OT Re-Evaluation 01/25/17   Authorization Type self pay   OT Start Time 1148   OT Stop Time 1230   OT Time Calculation (min) 42 min      Past Medical History:  Diagnosis Date  . Anxiety   . Bipolar disorder ([place])   . Hyperlipidemia   . Hypertension   . Lipoma    Abdomen  . LIPOMA 01/20/2008  . Obesity   . Pneumonia   . Tobacco abuse     Past Surgical History:  Procedure Laterality Date  . CESAREAN SECTION    . lipoma removed  03/2011  . ORIF ANKLE FRACTURE  03/13/2012   Procedure: OPEN REDUCTION INTERNAL FIXATION (ORIF) ANKLE FRACTURE;  Surgeon: Jessy Oto, MD;  Location: [facility];  Service: Orthopedics;  Laterality: Right;    There were no vitals filed for this visit.      Subjective Assessment - 12/13/16 1150    Subjective  denies pain   Pertinent History ICH with seizures, Bipolar, see epic   Patient Stated Goals compensate for memory changes   Currently in Pain? No/denies         Treatment: Basic change making worksheet, mod v.c.for first 2 problems,  To get started then pt was able to correctly complete 7/8 problems. copying small peg design, pt became anxious and stopped task reporting it was too difficult. Reassurance provided then pt was given a basic square design and she was able to copy correct ly with increased time. Arm bike x 5 mins level 1 for conditioning. BP prior to task 159/86                     OT Education - 12/13/16 1218    Education provided Yes   Education Details memory compensation strategies   Person(s) Educated Patient   Methods Explanation   Comprehension Verbalized understanding;Returned demonstration          OT Short Term Goals - 11/28/16 1544      OT SHORT TERM GOAL #1   Title Pt will verbalize understanding of memory and cognitive compensation strategies.   Time 4   Period Weeks   Status New     OT SHORT TERM GOAL #2   Title Pt will demonstrate ability to perform basic cooking with supervision/ min v.c.   Time 4   Period Weeks   Status New     OT SHORT TERM GOAL #3   Title Pt will demonstrate ability to perform basic financial task such as change making at a modified independent level   Time 4   Period Weeks   Status New           OT Long Term Goals - 11/28/16 1543      OT LONG TERM GOAL #1   Title Pt will demonstrate selective attention and sufficent frustration tolerance in order to  to complete a functional organization/ problem solving task  x15 mins  in a busy environment   Time 8   Period Weeks   Status New  OT LONG TERM GOAL #2   Title Pt will demonstrate ability to perform basic cooking at a modified independent level demonstrating good safety awareness.   Time 8   Period Weeks   Status New     OT LONG TERM GOAL #3   Title Pt will report she has resumed performance of home management activities at a modiifed independent level.   Time 4   Period Weeks   Status New               Plan - 12/13/16 1230    Clinical Impression Statement Pt is progressing towards goals yet she requires significant encouragement due to low frustration tolerance.   Rehab Potential Fair   OT Frequency 2x / week   OT Duration 8 weeks   OT Treatment/Interventions Self-care/ADL training;DME and/or AE instruction;Patient/family education;Therapeutic activities;Cognitive remediation/compensation;Neuromuscular education;Cryotherapy;Energy conservation;Manual Therapy;Visual/perceptual remediation/compensation   Plan functional activities for attention/  problem solving, simple cooking/ home management   Consulted and Agree with Plan of Care Patient      Patient will benefit from skilled therapeutic intervention in order to improve the following deficits and impairments:  Impaired vision/preception, Decreased cognition, Decreased activity tolerance, Decreased safety awareness, Decreased knowledge of use of DME  Visit Diagnosis: Frontal lobe and executive function deficit  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 Iron deficiency anemia 12/12/2016  . Seizure ([place])   . ICH (intracerebral hemorrhage) ([place]) 11/08/2016  . CAP (community acquired pneumonia) 10/31/2016  . Dyspnea 10/31/2016  . Encounter for weight loss counseling 06/09/2016  . Tobacco abuse 05/10/2016  . Chronic obstructive pulmonary disease (COPD) ([place]) 05/10/2016  . RUQ abdominal pain 12/29/2013  . Left leg DVT ([place]) 09/29/2013  . Pre-diabetes 03/02/2013  . History of pulmonary embolism 05/05/2012  . Painful menstrual periods 01/05/2012  . Bipolar disorder ([place]) 07/03/2010  . History of tobacco abuse 08/23/2009  . Hyperlipidemia 04/30/2007  . OBESITY, NOS 01/01/2007  . HYPERTENSION, BENIGN SYSTEMIC 01/01/2007    Yoseph Haile 12/13/2016, 12:31 PM  [facility] [address] [place], [place], [address] Phone: [phone]   Fax:  [phone]  Name: Carla Hanson MRN: [id number] Date of Birth: 1965-11-07

## 2016-12-13 NOTE — Patient Instructions (Signed)

## 2016-12-16 ENCOUNTER — Ambulatory Visit: Payer: Self-pay | Admitting: Oncology

## 2016-12-16 NOTE — Telephone Encounter (Signed)
Patient called back and accepted appointment for 10:00 on 3/7.

## 2016-12-19 ENCOUNTER — Telehealth: Payer: Self-pay | Admitting: Occupational Therapy

## 2016-12-19 ENCOUNTER — Ambulatory Visit: Payer: Self-pay | Admitting: Occupational Therapy

## 2016-12-19 ENCOUNTER — Other Ambulatory Visit: Payer: Self-pay | Admitting: Neurology

## 2016-12-19 DIAGNOSIS — R2681 Unsteadiness on feet: Secondary | ICD-10-CM

## 2016-12-19 DIAGNOSIS — R41844 Frontal lobe and executive function deficit: Secondary | ICD-10-CM

## 2016-12-19 DIAGNOSIS — W19XXXS Unspecified fall, sequela: Secondary | ICD-10-CM

## 2016-12-19 DIAGNOSIS — I69115 Cognitive social or emotional deficit following nontraumatic intracerebral hemorrhage: Secondary | ICD-10-CM

## 2016-12-19 DIAGNOSIS — M6281 Muscle weakness (generalized): Secondary | ICD-10-CM

## 2016-12-19 NOTE — Telephone Encounter (Signed)
Thanks. will order it

## 2016-12-19 NOTE — Telephone Encounter (Signed)
Dr. Leonie Hanson, Carla Hanson is receiving occupational therapy. She reports stumbling and losing her balance. She would like a referral for PT. If you agree please make this referral. Thank you, Theone Murdoch, OTR/L

## 2016-12-19 NOTE — Therapy (Signed)
[facility] [address] [place], [place], [address] Phone: [phone]   Fax:  [phone]  Occupational Therapy Treatment  Patient Details  Name: Carla Hanson MRN: [id number] Date of Birth: Jul 26, 1966 Referring Provider: Dr. Leonie Man  Encounter Date: 12/19/2016      OT End of Session - 12/19/16 1227    Visit Number 3   Number of Visits 17   Date for OT Re-Evaluation 01/25/17   Authorization Type self pay   OT Start Time 1105   OT Stop Time 1145   OT Time Calculation (min) 40 min   Activity Tolerance Patient tolerated treatment well   Behavior During Therapy [facility] for tasks assessed/performed      Past Medical History:  Diagnosis Date  . Anxiety   . Bipolar disorder ([place])   . Hyperlipidemia   . Hypertension   . Lipoma    Abdomen  . LIPOMA 01/20/2008  . Obesity   . Pneumonia   . Tobacco abuse     Past Surgical History:  Procedure Laterality Date  . CESAREAN SECTION    . lipoma removed  03/2011  . ORIF ANKLE FRACTURE  03/13/2012   Procedure: OPEN REDUCTION INTERNAL FIXATION (ORIF) ANKLE FRACTURE;  Surgeon: Jessy Oto, MD;  Location: [facility];  Service: Orthopedics;  Laterality: Right;    There were no vitals filed for this visit.      Subjective Assessment - 12/19/16 1125    Subjective  denies pain   Pertinent History ICH with seizures, Bipolar, low frustration tolerance, see epic   Patient Stated Goals compensate for memory changes   Currently in Pain? No/denies            Treatment: Pt performed simple cooking task to locate items and scramble an egg. Pt located items, cooked the egg and turned off stove without cueing.Pt was able to wash dishes in standing with no LOB.  Pt reports he notices stumbling and tripping on left foot. Therapist has requested a PT order via telephone order. Activities for attention/ problem solving and visual perceptual skills. Pt completed a 25 piece puzzle with  min v.c. For organization. Copying a very basic peg design using left and right UE's, without verbal cues. Pt demonstrates improved attention and frustration tolerance. Task was performed in a min distracting environment. [name] She requests a HEP next visit.                    OT Short Term Goals - 12/19/16 1210      OT SHORT TERM GOAL #1   Title Pt will verbalize understanding of memory and cognitive compensation strategies.   Time 4   Period Weeks   Status On-going  Pt was educated regarding memory compensation strategies     OT SHORT TERM GOAL #2   Title Pt will demonstrate ability to perform basic cooking with supervision/ min v.c.   Time 4   Period Weeks   Status Achieved     OT SHORT TERM GOAL #3   Title Pt will demonstrate ability to perform basic financial task such as change making at a modified independent level   Time 4   Period Weeks   Status On-going           OT Long Term Goals - 11/28/16 1543      OT LONG TERM GOAL #1  Title Pt will demonstrate selective attention and sufficent frustration tolerance in order to  to complete a functional organization/ problem solving task  x15 mins  in a busy environment   Time 8   Period Weeks   Status New     OT LONG TERM GOAL #2   Title Pt will demonstrate ability to perform basic cooking at a modified independent level demonstrating good safety awareness.   Time 8   Period Weeks   Status New     OT LONG TERM GOAL #3   Title Pt will report she has resumed performance of home management activities at a modiifed independent level.   Time 4   Period Weeks   Status New               Plan - 12/19/16 1126    Clinical Impression Statement Pt was able to safely cook perform simple cooking task to scramble and egg.   Rehab Potential Fair   OT Frequency 2x / week   OT Duration 8 weeks   OT  Treatment/Interventions Self-care/ADL training;DME and/or AE instruction;Patient/family education;Therapeutic activities;Cognitive remediation/compensation;Neuromuscular education;Cryotherapy;Energy conservation;Manual Therapy;Visual/perceptual remediation/compensation   Plan UE HEP, (consider gentle strengthening/ endurance) pt reports arms fatigue, functional activities to address attention/ problem solving, home management   Consulted and Agree with Plan of Care Patient      Patient will benefit from skilled therapeutic intervention in order to improve the following deficits and impairments:  Impaired vision/preception, Decreased cognition, Decreased activity tolerance, Decreased safety awareness, Decreased knowledge of use of DME  Visit Diagnosis: Frontal lobe and executive function deficit  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 Iron deficiency anemia 12/12/2016  . Seizure ([place])   . ICH (intracerebral hemorrhage) ([place]) 11/08/2016  . CAP (community acquired pneumonia) 10/31/2016  . Dyspnea 10/31/2016  . Encounter for weight loss counseling 06/09/2016  . Tobacco abuse 05/10/2016  . Chronic obstructive pulmonary disease (COPD) ([place]) 05/10/2016  . RUQ abdominal pain 12/29/2013  . Left leg DVT ([place]) 09/29/2013  . Pre-diabetes 03/02/2013  . History of pulmonary embolism 05/05/2012  . Painful menstrual periods 01/05/2012  . Bipolar disorder ([place]) 07/03/2010  . History of tobacco abuse 08/23/2009  . Hyperlipidemia 04/30/2007  . OBESITY, NOS 01/01/2007  . HYPERTENSION, BENIGN SYSTEMIC 01/01/2007    Carla Hanson 12/19/2016, 12:35 PM  [facility] [address] [place], [place], [address] Phone: [phone]   Fax:  [phone]  Name: Carla Hanson MRN: [id number] Date of Birth: 18-Sep-1966

## 2016-12-23 ENCOUNTER — Ambulatory Visit: Payer: Self-pay | Admitting: Occupational Therapy

## 2016-12-23 ENCOUNTER — Encounter: Payer: Self-pay | Admitting: Occupational Therapy

## 2016-12-23 ENCOUNTER — Ambulatory Visit: Payer: Self-pay | Admitting: Speech Pathology

## 2016-12-23 DIAGNOSIS — R2681 Unsteadiness on feet: Secondary | ICD-10-CM

## 2016-12-23 DIAGNOSIS — I69115 Cognitive social or emotional deficit following nontraumatic intracerebral hemorrhage: Secondary | ICD-10-CM

## 2016-12-23 DIAGNOSIS — M6281 Muscle weakness (generalized): Secondary | ICD-10-CM

## 2016-12-23 DIAGNOSIS — R41844 Frontal lobe and executive function deficit: Secondary | ICD-10-CM

## 2016-12-23 DIAGNOSIS — R41841 Cognitive communication deficit: Secondary | ICD-10-CM

## 2016-12-23 NOTE — Patient Instructions (Addendum)
Home exercises for your left forearm:  Do these 2 times per day every day.  STOP and let your therapist know if you get pain and she will adjust the program.  Your arm may feel a little sore just from "working out" and that is ok but acute pain is not.    1.  Lay your arm on the table palm facing down with your hand hanging off the edge of the table.  Hod a 2-3 pound weight in your hand and bend your wrist back. Then return to starting position.  Do 12-15, rest then do 12-15 more.   2.  Lay your arm on the table with hand hanging off the edge with your palm facing up.  Hold a 2-3 pound weight in your hand.  Bend your wrist up and then return to the starting position.  Do 12-15, rest then do 12-15 more.    3.  Sit at table with your elbow bent and close to your side.  Hold a hammer in your hand. Holding the hammer turn your palm down to face the table  and then turn it all the up facing the ceiling.  Do 10 times, rest and to 10 more.  4.  Sit or stand and hold a 2-3 pound weight in your hand.  Keep your arm close to your body.  Bend elbow all the way and then straighten all the way.  Do 15, rest then do 15 more.

## 2016-12-23 NOTE — Therapy (Signed)
[facility] [address] [place], [place], [address] Phone: [phone]   Fax:  [phone]  Speech Language Pathology Evaluation  Patient Details  Name: Carla Hanson MRN: [id number] Date of Birth: 08/03/1966 Referring Provider: Garvin Fila, MD  Encounter Date: 12/23/2016      End of Session - 12/23/16 1836    Visit Number 1   Number of Visits 17   Date for SLP Re-Evaluation 02/21/17   Authorization Type self pay   SLP Start Time 0932   SLP Stop Time  1016   SLP Time Calculation (min) 44 min   Activity Tolerance Patient tolerated treatment well;Other (comment)  pt limited by frustration tolerance      Past Medical History:  Diagnosis Date  . Anxiety   . Bipolar disorder ([place])   . Hyperlipidemia   . Hypertension   . Lipoma    Abdomen  . LIPOMA 01/20/2008  . Obesity   . Pneumonia   . Tobacco abuse     Past Surgical History:  Procedure Laterality Date  . CESAREAN SECTION    . lipoma removed  03/2011  . ORIF ANKLE FRACTURE  03/13/2012   Procedure: OPEN REDUCTION INTERNAL FIXATION (ORIF) ANKLE FRACTURE;  Surgeon: Jessy Oto, MD;  [facility];  Service: Orthopedics;  Laterality: Right;    There were no vitals filed for this visit.      Subjective Assessment - 12/23/16 0935    Subjective "To help me with my speech," "I stutter sometimes, I say one thing but I mean another." re: why she's here today   Currently in Pain? No/denies            SLP Evaluation [facility] - 12/23/16 0935      SLP Visit Information   SLP Received On 12/23/16   Referring Provider Garvin Fila, MD   Onset Date 11/09/2016   Medical Diagnosis R47.01 (ICD-10-CM) - Aphasia     Subjective   Patient/Family Stated Goal work on my speech     General Information   HPI 51 y.o.femalewith history of tobacco and alcohol abuse, pneumonia, obesity, hypertension, hyperlipidemia, bipolar disorder, and a hx of DVTs treated  with Xarelto presented to [facility] with a 3 day history of headaches and aphasia.Shedidnot receive IV t-PA due to [place] in the left parieto-occipital region. Surrounding efema and mild regional mass effect also noted as well as a little subarachnoid hemorrhage on the contralateral side.SLP evaluation in hospital noted low frustration tolerance, reduced ability to follow commands, impaired divergent naming, suspected auditory comprehension deficits, and mild anomia in spontaneous speech.    Behavioral/Cognition alert, cooperative   Mobility Status ambulated to session independently     Prior Functional Status   Cognitive/Linguistic Baseline Within functional limits   Type of Home House    Lives With Other (Comment)  living with mother currently   Available Support Available 24 hours/day   Education 10th grade   Vocation Other (comment)  disability pending     Cognition   Overall Cognitive Status Impaired/Different from baseline   Area of Impairment Attention;Problem solving;Safety/judgement;Following commands   Current Attention Level Sustained   Attention Comments reduces with increased cognitive load, mildly complex instructions   Memory --  decreased delayed recall (4/5), suspect due to attention    Following Commands Follows one step commands consistently;Follows multi-step commands inconsistently;Follows multi-step commands with increased time   Safety/Judgement Decreased awareness of safety;Decreased awareness of deficits   Problem Solving Slow  processing;Decreased initiation;Requires verbal cues;Difficulty sequencing   Problem Solving Comments frequently expressed frustration, "i can't do that" with clock drawing, sequencing tasks   Attention Sustained   Sustained Attention Impaired   Sustained Attention Impairment Verbal complex;Functional complex   Memory --  suspect attention impacting memory   Problem Solving Impaired   Problem Solving Impairment Verbal basic;Functional basic    Corporate treasurer;Sequencing;Organizing;Decision Making;Initiating;Self Monitoring;Self Correcting   Reasoning Impaired   Sequencing Impaired   Organizing Impaired   Organizing Impairment Functional basic   Decision Making Impaired   Decision Making Impairment Functional complex;Verbal complex   Initiating Impaired   Self Monitoring Impaired   Self Correcting Impaired     Auditory Comprehension   Overall Auditory Comprehension Appears within functional limits for tasks assessed   Yes/No Questions Within Functional Limits   Commands Within Functional Limits  basic commands   Conversation Moderately complex   Other Conversation Comments no anomia noted   Interfering Components Attention;Processing speed;Working Software engineer [employer]     Reading Comprehension   Reading Status Within funtional limits     Expression   Primary Mode of Expression Verbal     Verbal Expression   Overall Verbal Expression Appears within functional limits for tasks assessed   Repetition No impairment   Naming Impairment   Responsive 76-100% accurate   Confrontation 75-100% accurate   Other Naming Comments generative naming impaired (8 words in 1 minute)     Written Expression   Dominant Hand Right   Written Expression Not tested     Oral Motor/Sensory Function   Overall Oral Motor/Sensory Function Appears within functional limits for tasks assessed     Motor Speech   Overall Motor Speech Appears within functional limits for tasks assessed     Standardized Assessments   Standardized Assessments  Boston Diagnostic Apashia Examiniation-3rd;Montreal Cognitive Assessment ([place])   Montreal Cognitive Assessment [facility])  19/30   Boston Diagnostic Apashia Examiniation-3rd edition  WNL                         SLP Education - 12/23/16 1835    Education provided Yes   Education Details plan of care, reducing  time pressure, strategies for attention   Person(s) Educated Patient   Methods Explanation   Comprehension Verbalized understanding          SLP Short Term Goals - 12/23/16 1848      SLP SHORT TERM GOAL #1   Title Patient will verbalize 4 strategies to improve attention over 2 sessions   Time 4   Period Weeks   Status New     SLP SHORT TERM GOAL #2   Title Patient will demo use of compensatory aids and strategies to improve attention and executive function for 85% accuracy during functional tasks with mod A    Time 4   Period Weeks   Status New          SLP Long Term Goals - 12/23/16 1857      SLP LONG TERM GOAL #1   Title Patient will report use of compensatory aids and strategies for attention outside of therapy tasks for 50% perceived improvement in functioning at home   Time [place] - 12/23/16 1839    Clinical Impression Statement Patient presents with deficits in attention, executive  function (problem solving, sequencing, judgment) and working memory deficits requiring additional processing time. Expressive and receptive language appear within normal limits. During testing, patient was noted to grimace and touch her head in response to multi-step directions, stated, "I'm supposed to do what, now?" Given additional processing time, she was able to verbalize and follow instructions. When encountering tasks which were difficult for her, including clock drawing, and generative naming, patient required cues for task persistence. For serial subtraction, patient stated, "I can't do that," refused to complete the task or an alternative sequencing task which was provided. She will benefit from skilled ST to address the above deficits and to improve quality of life and functional cognitive-communication.   Speech Therapy Frequency 2x / week   Duration Other (comment)  8 weeks   Treatment/Interventions Cueing hierarchy;SLP instruction and  feedback;Functional tasks;Cognitive reorganization;Compensatory strategies;Patient/family education;Internal/external aids   Potential to Achieve Goals Good   Potential Considerations Cooperation/participation level   Consulted and Agree with Plan of Care Patient      Patient will benefit from skilled therapeutic intervention in order to improve the following deficits and impairments:   Cognitive communication deficit    Problem List Patient Active Problem List   Diagnosis Date Noted  . Iron deficiency anemia 12/12/2016  . Seizure ([place])   . ICH (intracerebral hemorrhage) ([place]) 11/08/2016  . CAP (community acquired pneumonia) 10/31/2016  . Dyspnea 10/31/2016  . Encounter for weight loss counseling 06/09/2016  . Tobacco abuse 05/10/2016  . Chronic obstructive pulmonary disease (COPD) ([place]) 05/10/2016  . RUQ abdominal pain 12/29/2013  . Left leg DVT ([place]) 09/29/2013  . Pre-diabetes 03/02/2013  . History of pulmonary embolism 05/05/2012  . Painful menstrual periods 01/05/2012  . Bipolar disorder ([place]) 07/03/2010  . History of tobacco abuse 08/23/2009  . Hyperlipidemia 04/30/2007  . OBESITY, NOS 01/01/2007  . HYPERTENSION, BENIGN SYSTEMIC 01/01/2007   Deneise Lever, [place] CF-SLP Speech-Language Pathologist  Aliene Altes 12/23/2016, 7:04 PM  [facility] [address] [place], [place], [address] Phone: [phone]   Fax:  [phone]  Name: Carla Hanson MRN: [id number] Date of Birth: [birth year]

## 2016-12-23 NOTE — Therapy (Signed)
[facility] [address] [place], [place], [address] Phone: [phone]   Fax:  [phone]  Occupational Therapy Treatment  Patient Details  Name: Carla Hanson MRN: [id number] Date of Birth: 02/27/1966 Referring Provider: Dr. Leonie Man  Encounter Date: 12/23/2016      OT End of Session - 12/23/16 1645    Visit Number 4   Number of Visits 17   Date for OT Re-Evaluation 01/25/17   Authorization Type self pay   OT Start Time 1102   OT Stop Time 1143   OT Time Calculation (min) 41 min   Activity Tolerance Patient tolerated treatment well      Past Medical History:  Diagnosis Date  . Anxiety   . Bipolar disorder ([place])   . Hyperlipidemia   . Hypertension   . Lipoma    Abdomen  . LIPOMA 01/20/2008  . Obesity   . Pneumonia   . Tobacco abuse     Past Surgical History:  Procedure Laterality Date  . CESAREAN SECTION    . lipoma removed  03/2011  . ORIF ANKLE FRACTURE  03/13/2012   Procedure: OPEN REDUCTION INTERNAL FIXATION (ORIF) ANKLE FRACTURE;  Surgeon: Jessy Oto, MD;  Location: [facility];  Service: Orthopedics;  Laterality: Right;    There were no vitals filed for this visit.      Subjective Assessment - 12/23/16 1108    Subjective  I have a little head ache from speech therapy   Pertinent History ICH with seizures, Bipolar, low frustration tolerance, see epic   Patient Stated Goals compensate for memory changes   Currently in Pain? Yes   Pain Score 2    Pain Location Head   Pain Orientation Left;Anterior   Pain Descriptors / Indicators Aching;Dull   Pain Type Acute pain   Pain Onset Today   Pain Frequency Intermittent   Aggravating Factors  speech therapy brought it on   Pain Relieving Factors OTC meds help,                       OT Treatments/Exercises (OP) - 12/23/16 0001      Cognitive Exercises   Other Cognitive Exercises 1 Pt today able to copy moderately complex peg design  to address problem solving, organization and frustration tolerance.  Pt able to identify error and fix with increased time and did not show any issues with frustration tolerance.  Pt did express that her headach was worse at end of session "because of all this thinking."      Exercises   Exercises Elbow     Elbow Exercises   Other elbow exercises Pt reporting that her LUE fatigues with use.  Pt reports that it is only in her forearm and NOT in hand or shoulder.  Pt issued HEP and able to return demonstrate all activities - see pt instruction section for details.  Pt able to use between 2-3 pounds in clinic and states she isn't sure if home weight is 2 or 3 pounds. Pt to use what she has at home.                   OT Short Term Goals - 12/23/16 1644      OT SHORT TERM GOAL #1   Title Pt will verbalize understanding of memory and cognitive compensation strategies.   Time 4   Period Weeks   Status On-going  Pt was educated regarding memory compensation strategies  OT SHORT TERM GOAL #2   Title Pt will demonstrate ability to perform basic cooking with supervision/ min v.c.   Time 4   Period Weeks   Status Achieved     OT SHORT TERM GOAL #3   Title Pt will demonstrate ability to perform basic financial task such as change making at a modified independent level   Time 4   Period Weeks   Status On-going           OT Long Term Goals - 12/23/16 1644      OT LONG TERM GOAL #1   Title Pt will demonstrate selective attention and sufficent frustration tolerance in order to  to complete a functional organization/ problem solving task  x15 mins  in a busy environment   Time 8   Period Weeks   Status New     OT LONG TERM GOAL #2   Title Pt will demonstrate ability to perform basic cooking at a modified independent level demonstrating good safety awareness.   Time 8   Period Weeks   Status New     OT LONG TERM GOAL #3   Title Pt will report she has resumed performance  of home management activities at a modiifed independent level.   Time 8   Period Weeks   Status New     OT LONG TERM GOAL #4   Title I with HEP for increased strength and endurance  in prep for ADLs.   Time 8   Period Weeks   Status New               Plan - 12/23/16 1644    Clinical Impression Statement Pt progressing toward goals. Pt improving in basic problem solving, organization and frustration tolerance.     Rehab Potential Fair   OT Frequency 2x / week   OT Duration 8 weeks   OT Treatment/Interventions Self-care/ADL training;DME and/or AE instruction;Patient/family education;Therapeutic activities;Cognitive remediation/compensation;Neuromuscular education;Cryotherapy;Energy conservation;Manual Therapy;Visual/perceptual remediation/compensation   Plan check UE HEP, functional activities for attention, problem solving, home mgmt   Consulted and Agree with Plan of Care Patient      Patient will benefit from skilled therapeutic intervention in order to improve the following deficits and impairments:  Impaired vision/preception, Decreased cognition, Decreased activity tolerance, Decreased safety awareness, Decreased knowledge of use of DME  Visit Diagnosis: Frontal lobe and executive function deficit  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 Iron deficiency anemia 12/12/2016  . Seizure ([place])   . ICH (intracerebral hemorrhage) ([place]) 11/08/2016  . CAP (community acquired pneumonia) 10/31/2016  . Dyspnea 10/31/2016  . Encounter for weight loss counseling 06/09/2016  . Tobacco abuse 05/10/2016  . Chronic obstructive pulmonary disease (COPD) ([place]) 05/10/2016  . RUQ abdominal pain 12/29/2013  . Left leg DVT ([place]) 09/29/2013  . Pre-diabetes 03/02/2013  . History of pulmonary embolism 05/05/2012  .  Painful menstrual periods 01/05/2012  . Bipolar disorder ([place]) 07/03/2010  . History of tobacco abuse 08/23/2009  . Hyperlipidemia 04/30/2007  . OBESITY, NOS 01/01/2007  . HYPERTENSION, BENIGN SYSTEMIC 01/01/2007    Quay Burow, OTR/L 12/23/2016, 4:48 PM  [facility] [address] [place], [place], [address] Phone: [phone]   Fax:  [phone]  Name: GLORYA CANDELL MRN: [id number] Date of Birth: 12-Sep-1966

## 2016-12-31 ENCOUNTER — Ambulatory Visit: Payer: Self-pay | Admitting: Occupational Therapy

## 2016-12-31 ENCOUNTER — Encounter: Payer: Self-pay | Admitting: Physical Therapy

## 2016-12-31 ENCOUNTER — Ambulatory Visit: Payer: Self-pay | Admitting: Physical Therapy

## 2016-12-31 ENCOUNTER — Ambulatory Visit: Payer: Self-pay | Admitting: *Deleted

## 2016-12-31 DIAGNOSIS — R41844 Frontal lobe and executive function deficit: Secondary | ICD-10-CM

## 2016-12-31 DIAGNOSIS — R41841 Cognitive communication deficit: Secondary | ICD-10-CM

## 2016-12-31 DIAGNOSIS — R2681 Unsteadiness on feet: Secondary | ICD-10-CM

## 2016-12-31 DIAGNOSIS — M6281 Muscle weakness (generalized): Secondary | ICD-10-CM

## 2016-12-31 DIAGNOSIS — I69115 Cognitive social or emotional deficit following nontraumatic intracerebral hemorrhage: Secondary | ICD-10-CM

## 2016-12-31 NOTE — Patient Instructions (Signed)
Strategies to improve attention/help with focus and concentration  1. Minimize distractions - a quiet environment. Do things when [place] is napping  2. Make sure you are well rested - get a good night sleep each night  3. Take a walk - clear your head  4. Yoga, stretching, deep breathing  5. REST - give your brain a break  6. Go to your "happy place" using mental imagery (waterfalls)  7. Begin challenging tasks early in the day when you are fresh  8. Take breaks between tasks  9. Do one thing at a time  10. Keep your "energy bucket" in mind - what taxes your energy reserve? What refills it?   Strategies for improving memory  1. Keep therapy appointment printout handy  2. Write down activities, to-do lists on the paper using red or blue ink (or better yet, pencil)  3. Keep it simple - try to have only 1 place where you are writing things down  4. Use the strategies for improving attention and focus

## 2016-12-31 NOTE — Therapy (Signed)
[facility] [address] [place], [place], [address] Phone: [phone]   Fax:  [phone]  Occupational Therapy Treatment  Patient Details  Name: Carla Hanson MRN: [id number] Date of Birth: February 28, 1966 Referring Provider: Dr. Pearlean Brownie  Encounter Date: 12/31/2016      OT End of Session - 12/31/16 1340    Visit Number 5   Number of Visits 17   Date for OT Re-Evaluation 01/25/17   Authorization Type self pay   OT Start Time 1149   OT Stop Time 1230   OT Time Calculation (min) 41 min   Activity Tolerance Patient tolerated treatment well   Behavior During Therapy [facility] for tasks assessed/performed      Past Medical History:  Diagnosis Date  . Anxiety   . Bipolar disorder (HCC)   . Hyperlipidemia   . Hypertension   . Lipoma    Abdomen  . LIPOMA 01/20/2008  . Obesity   . Pneumonia   . Tobacco abuse     Past Surgical History:  Procedure Laterality Date  . CESAREAN SECTION    . lipoma removed  03/2011  . ORIF ANKLE FRACTURE  03/13/2012   Procedure: OPEN REDUCTION INTERNAL FIXATION (ORIF) ANKLE FRACTURE;  Surgeon: Kerrin Champagne, MD;  Location: [facility];  Service: Orthopedics;  Laterality: Right;    There were no vitals filed for this visit.      Subjective Assessment - 12/31/16 1338    Subjective  I have a little head ache from speech therapy   Pertinent History ICH with seizures, Bipolar, low frustration tolerance, see epic   Patient Stated Goals compensate for memory changes   Currently in Pain? Yes   Pain Score 2    Pain Location Head   Pain Descriptors / Indicators Aching   Pain Type Acute pain   Pain Onset Today   Aggravating Factors  concentrating brought it on   Pain Relieving Factors OTC meds help   Multiple Pain Sites No             Treatment: Reviewed HEP issued last visit, 10 reps each exercise, min v.c. intially then pt returned demonstration. Pt demonstrates ability to simulate  paying for items using exact change, pt however was unable to calculate correct change. She became very frustrated and tearful. Therapist provided reassurance. Pt reports paying for items with debit card currently. Arm bike x 5 mins level 3 for conditioning, pt maintained 40 rpm                 OT Education - 12/31/16 1341    Education provided Yes   Education Details reviewed short term memory strategies, and HEP issued last visit   Person(s) Educated Patient   Methods Explanation;Demonstration;Verbal cues;Handout   Comprehension Verbalized understanding;Returned demonstration;Verbal cues required          OT Short Term Goals - 12/31/16 1204      OT SHORT TERM GOAL #1   Title Pt will verbalize understanding of memory and cognitive compensation strategies.   Time 4     OT SHORT TERM GOAL #2   Title Pt will demonstrate ability to perform basic cooking with supervision/ min v.c.   Time 4   Period Weeks   Status Achieved     OT SHORT TERM GOAL #3   Title Pt will demonstrate ability to perform basic financial task such as change making at a modified independent level   Time 4   Period Weeks  Status Partially Met  Pt reports perfroming with debit card, unable to make change, however able to pay with correct amount.           OT Long Term Goals - 12/23/16 1644      OT LONG TERM GOAL #1   Title Pt will demonstrate selective attention and sufficent frustration tolerance in order to  to complete a functional organization/ problem solving task  x15 mins  in a busy environment   Time 8   Period Weeks   Status New     OT LONG TERM GOAL #2   Title Pt will demonstrate ability to perform basic cooking at a modified independent level demonstrating good safety awareness.   Time 8   Period Weeks   Status New     OT LONG TERM GOAL #3   Title Pt will report she has resumed performance of home management activities at a modiifed independent level.   Time 8   Period  Weeks   Status New     OT LONG TERM GOAL #4   Title I with HEP for increased strength and endurance  in prep for ADLs.   Time 8   Period Weeks   Status New               Plan - 12/31/16 1339    Clinical Impression Statement Pt is progressing towards goals. Pt continues to be limited by low frustration tolerance.   Rehab Potential Fair   OT Frequency 2x / week   OT Duration 8 weeks   OT Treatment/Interventions Self-care/ADL training;DME and/or AE instruction;Patient/family education;Therapeutic activities;Cognitive remediation/compensation;Neuromuscular education;Cryotherapy;Energy conservation;Manual Therapy;Visual/perceptual remediation/compensation   Plan address cognition in a functional context( home management/ cooking)   Consulted and Agree with Plan of Care Patient      Patient will benefit from skilled therapeutic intervention in order to improve the following deficits and impairments:  Impaired vision/preception, Decreased cognition, Decreased activity tolerance, Decreased safety awareness, Decreased knowledge of use of DME  Visit Diagnosis: 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 Iron deficiency anemia 12/12/2016  . Seizure ([place])   . ICH (intracerebral hemorrhage) ([place]) 11/08/2016  . CAP (community acquired pneumonia) 10/31/2016  . Dyspnea 10/31/2016  . Encounter for weight loss counseling 06/09/2016  . Tobacco abuse 05/10/2016  . Chronic obstructive pulmonary disease (COPD) ([place]) 05/10/2016  . RUQ abdominal pain 12/29/2013  . Left leg DVT ([place]) 09/29/2013  . Pre-diabetes 03/02/2013  . History of pulmonary embolism 05/05/2012  . Painful menstrual periods 01/05/2012  . Bipolar disorder ([place]) 07/03/2010  . History of tobacco abuse 08/23/2009  . Hyperlipidemia  04/30/2007  . OBESITY, NOS 01/01/2007  . HYPERTENSION, BENIGN SYSTEMIC 01/01/2007    RINE,KATHRYN 12/31/2016, 1:42 PM  [facility] [address] [place], [place], [address] Phone: [phone]   Fax:  [phone]  Name: Carla Hanson MRN: [id number] Date of Birth: 07/17/1966

## 2016-12-31 NOTE — Patient Instructions (Signed)

## 2016-12-31 NOTE — Therapy (Signed)
[facility] [address] [place], [place], [address] Phone: [phone]   Fax:  [phone]  Speech Language Pathology Treatment  Patient Details  Name: Carla Hanson MRN: [id number] Date of Birth: Jan 31, 1966 Referring Provider: Garvin Fila, MD  Encounter Date: 12/31/2016      End of Session - 12/31/16 1237    Visit Number 2   Number of Visits 17   Date for SLP Re-Evaluation 02/21/17   SLP Start Time 54   SLP Stop Time  1105   SLP Time Calculation (min) 40 min   Activity Tolerance Patient tolerated treatment well      Past Medical History:  Diagnosis Date  . Anxiety   . Bipolar disorder ([place])   . Hyperlipidemia   . Hypertension   . Lipoma    Abdomen  . LIPOMA 01/20/2008  . Obesity   . Pneumonia   . Tobacco abuse     Past Surgical History:  Procedure Laterality Date  . CESAREAN SECTION    . lipoma removed  03/2011  . ORIF ANKLE FRACTURE  03/13/2012   Procedure: OPEN REDUCTION INTERNAL FIXATION (ORIF) ANKLE FRACTURE;  Surgeon: Jessy Oto, MD;  Location: [facility];  Service: Orthopedics;  Laterality: Right;    There were no vitals filed for this visit.      Subjective Assessment - 12/31/16 1235    Subjective "My memory in general - its frustrating " re: reason for therapy   Currently in Pain? No/denies               ADULT SLP TREATMENT - 12/31/16 0001      General Information   Behavior/Cognition Alert;Cooperative;Pleasant mood     Treatment Provided   Treatment provided Cognitive-Linquistic     Pain Assessment   Pain Assessment No/denies pain     Cognitive-Linquistic Treatment   Treatment focused on Cognition;Patient/family/caregiver education   Skilled Treatment ST session focused on review of evaluation results and goals set last session. SLP and pt discussed methods of increasing organization for functional recall, and came up with several strategies for improving attention  and memory.      Assessment / Recommendations / Plan   Plan Continue with current plan of care     Progression Toward Goals   Progression toward goals Progressing toward goals          SLP Education - 12/31/16 1237    Education provided Yes   Education Details strategies for improving recall and attention   Person(s) Educated Patient   Methods Explanation;Demonstration;Verbal cues;Handout   Comprehension Verbalized understanding;Need further instruction;Verbal cues required          SLP Short Term Goals - 12/31/16 1240      SLP SHORT TERM GOAL #1   Title Patient will verbalize 4 strategies to improve attention over 2 sessions   Time 4   Period Weeks   Status On-going     SLP SHORT TERM GOAL #2   Title Patient will demo use of compensatory aids and strategies to improve attention and executive function for 85% accuracy during functional tasks with mod A    Time 4   Period Weeks   Status On-going          SLP Long Term Goals - 12/31/16 1240      SLP LONG TERM GOAL #1   Title Patient will report use of compensatory aids and strategies for attention outside of therapy tasks for 50% perceived improvement  in functioning at home   Time 8   Period Weeks   Status On-going          Plan - 12/31/16 1237    Clinical Impression Statement Pt returns to therapy after evaluation last session. She reports her difficulty with memory is frustrating, stating she "can't remember what she is supposed to remember". SLP and pt discussed and typed out a list of strategies for improvement of focus/concentration and functional recall. Continued ST intervention is recommended, targeting functional improvement of memory and attention   Speech Therapy Frequency 2x / week   Duration --  8 weeks   Treatment/Interventions Cueing hierarchy;SLP instruction and feedback;Functional tasks;Cognitive reorganization;Compensatory strategies;Patient/family education;Internal/external aids   Potential  to Achieve Goals Good   Potential Considerations Cooperation/participation level;Family/community support;Ability to learn/carryover information   SLP Home Exercise Plan reviewed and provided    Consulted and Agree with Plan of Care Patient      Patient will benefit from skilled therapeutic intervention in order to improve the following deficits and impairments:   Cognitive communication deficit    Problem List Patient Active Problem List   Diagnosis Date Noted  . Iron deficiency anemia 12/12/2016  . Seizure ([place])   . ICH (intracerebral hemorrhage) ([place]) 11/08/2016  . CAP (community acquired pneumonia) 10/31/2016  . Dyspnea 10/31/2016  . Encounter for weight loss counseling 06/09/2016  . Tobacco abuse 05/10/2016  . Chronic obstructive pulmonary disease (COPD) ([place]) 05/10/2016  . RUQ abdominal pain 12/29/2013  . Left leg DVT ([place]) 09/29/2013  . Pre-diabetes 03/02/2013  . History of pulmonary embolism 05/05/2012  . Painful menstrual periods 01/05/2012  . Bipolar disorder ([place]) 07/03/2010  . History of tobacco abuse 08/23/2009  . Hyperlipidemia 04/30/2007  . OBESITY, NOS 01/01/2007  . HYPERTENSION, BENIGN SYSTEMIC 01/01/2007   Carla Torre B. [place], MSP, CCC-SLP  Shonna Chock 12/31/2016, 12:41 PM  [facility] [address] [place], [place], [address] Phone: [phone]   Fax:  [phone]   Name: Carla Hanson MRN: [id number] Date of Birth: 1966/01/07

## 2017-01-01 ENCOUNTER — Ambulatory Visit (INDEPENDENT_AMBULATORY_CARE_PROVIDER_SITE_OTHER): Payer: Self-pay | Admitting: Neurology

## 2017-01-01 ENCOUNTER — Encounter: Payer: Self-pay | Admitting: Neurology

## 2017-01-01 VITALS — BP 135/89 | HR 77 | Ht 64.0 in | Wt 312.8 lb

## 2017-01-01 DIAGNOSIS — I619 Nontraumatic intracerebral hemorrhage, unspecified: Secondary | ICD-10-CM

## 2017-01-01 DIAGNOSIS — R4701 Aphasia: Secondary | ICD-10-CM

## 2017-01-01 MED ORDER — LEVETIRACETAM 500 MG PO TABS: 500.0000 mg | ORAL_TABLET | Freq: Two times a day (BID) | ORAL | 12 refills | Status: DC

## 2017-01-01 NOTE — Patient Instructions (Signed)
Remember to drink plenty of fluid, eat healthy meals and do not skip any meals. Try to eat protein with a every meal and eat a healthy snack such as fruit or nuts in between meals. Try to keep a regular sleep-wake schedule and try to exercise daily, particularly in the form of walking, 20-30 minutes a day, if you can.   As far as your medications are concerned, I would like to suggest: Continue current medications  As far as diagnostic testing: will repeat CT of the head and EEG in 6 months  I would like to see you back in 6 months, sooner if we need to. Please call us with any interim questions, concerns, problems, updates or refill requests.   Our phone number is [phone]. We also have an after hours call service for urgent matters and there is a physician on-call for urgent questions. For any emergencies you know to call 911 or go to the nearest emergency room

## 2017-01-01 NOTE — Progress Notes (Addendum)
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 Seizure [facility]) symptomatic secondary to intracerebral hemorrhage  HPI:  Carla Hanson is a 51 y.o. female here as a referral from Dr. Ola Spurr for Stroke follow up. Carla Hanson a 51 y.o.femalewith 3 day history of headaches and aphasia. Apparently this has been a static deficit for the past 3 days. On arrival to the emergency room, she had a head CT which shows a parenchymal hemorrhage in the left parieto-occipital region that is at the service. She also has a little subarachnoid hemorrhage on the contralateral side. There is some associated restricted diffusion with the hemorrhage on the left. Her aphasia has significantly improved, sometimes her words get "tangled", left arm weakness and is PT. Back to her previous baseline. No issues. She has a little difficulty processing, she went to OT for processing information and cognitive therapy. She goes 2x a week for 8 weeks. She doesn't remember falling. She just got a headache without etiology prior to the hemorrhage. She was on antibiotics and thought the headache was from that for several days. She was brought to the hospital. She remembers a lot of things but some things she doesn't remember.   Ct Head Wo Contrast 11/08/2016 1. Edema in the left temporal occipital cortex with regional cortical/subarachnoid hemorrhage. This likely reflects a hemorrhagic infarct. Given location and demographics would include MRV at time of brain MRI.  2. Small volume subarachnoid hemorrhage at the high right frontal convexity, without neighboring brain edema. Question generalized vasculopathy. In this patient with recent pneumonia and elevated WBC, suggest post-contrast brain MRI as well.     Mr Carla Hanson Wo Contrast / Mr Mrv Head Wo Cm 11/08/2016   1. Stable trefoil focus of hemorrhage in the lateral left occipital lobe with surrounding edema and mild regional mass effect. There is possibly mild parenchymal infarct along the margins of the hemorrhage. Superimposed small focus of right vertex subarachnoid hemorrhages confirmed but lacks associated edema or mass effect.  2. Although this study is negative for underlying dural sinus thrombosis, it is possible that #1 are the sequelae of thrombosis of small cortical veins - especially if the patient has been dehydrated or has an underlying coagulopathy.  3. No underlying chronic cerebral blood products to suggest a chronic angiopathy. No prior infarcts identified.   EEG 1) Left temporal sharp waves 2) Irregular left temporal delta activity 3) generalized irregular   Review of Systems: Patient complains of symptoms per HPI as well as the following symptoms: aphasia. Pertinent negatives per HPI. All others negative.   Social History   Social History  . Marital status: Legally Separated    Spouse name: N/A  . Number of children: 4  . Years of education: 10   Occupational History  . N/A    Social History Main Topics  . Smoking status: Former Smoker    Packs/day: 0.30    Years: 30.00    Types: Cigarettes    Start date: 11/04/1981    Quit date: 10/2016  . Smokeless tobacco: Never Used  . Alcohol use No     Comment: Quit 10/2016  . Drug use: No  . Sexual activity: Not on file     Comment: tubal   Other Topics Concern  . 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  Considering nursing.    Lives at home alone   Right-handed   Caffeine: not much    Family History  Problem Relation Age of Onset  . Asthma Father   . Heart attack Father   . Other Mother     High blood pressure runs in  the family  . Diabetes Maternal Aunt     Past Medical History:  Diagnosis Date  . Anxiety   . Bipolar disorder ([place])   . Hyperlipidemia   . Hypertension   . Lipoma    Abdomen  . LIPOMA 01/20/2008  . Obesity   . Pneumonia   . Tobacco abuse     Past Surgical History:  Procedure Laterality Date  . CESAREAN SECTION    . CYSTECTOMY    . LIPOMA EXCISION  03/2011  . ORIF ANKLE FRACTURE  03/13/2012   Procedure: OPEN REDUCTION INTERNAL FIXATION (ORIF) ANKLE FRACTURE;  Surgeon: Jessy Oto, MD;  Location: [facility];  Service: Orthopedics;  Laterality: Right;    Current Outpatient Prescriptions  Medication Sig Dispense Refill  . acetaminophen (TYLENOL) 325 MG tablet Take 650 mg by mouth every 6 (six) hours as needed for mild pain.    Marland Kitchen albuterol (PROVENTIL HFA;VENTOLIN HFA) 108 (90 Base) MCG/ACT inhaler Inhale 1 puff into the lungs every 6 (six) hours as needed for wheezing or shortness of breath. 6.7 g 1  . aspirin EC 81 MG EC tablet Take 1 tablet (81 mg total) by mouth daily. 30 tablet 12  . levETIRAcetam (KEPPRA) 500 MG tablet Take 1 tablet (500 mg total) by mouth 2 (two) times daily. 60 tablet 12  . losartan-hydrochlorothiazide (HYZAAR) 50-12.5 MG tablet Take 1 tablet by mouth daily. 30 tablet 2  . mometasone-formoterol (DULERA) 200-5 MCG/ACT AERO Inhale 2 puffs into the lungs 2 (two) times daily. 1 Inhaler 1   No current facility-administered medications for this visit.     Allergies as of 01/01/2017 - Review Complete 01/01/2017  Allergen Reaction Noted  . Latuda [lurasidone] Other (See Comments) 11/08/2016  . Tape Rash 11/08/2016    Vitals: BP 135/89   Pulse 77   Ht 5\' 4"  (1.626 m)   Wt (!) 312 lb 12.8 oz (141.9 kg)   BMI 53.69 kg/m  Last Weight:  Wt Readings from Last 1 Encounters:  01/01/17 (!) 312 lb 12.8 oz (141.9 kg)   Last Height:   Ht Readings from Last 1 Encounters:  01/01/17 5\' 4"  (1.626 m)   Physical exam: Exam: Gen: NAD, conversant, well nourised,  obese, well groomed                     CV: RRR, no MRG. No Carotid Bruits. No peripheral edema, warm, nontender Eyes: Conjunctivae clear without exudates or hemorrhage  Neuro: Detailed Neurologic Exam  Speech:    Speech with mild aphasia; fluent and spontaneous with normal comprehension.  Cognition:    The patient is oriented to person, place, and time;     recent and remote memory intact;     language mild aphasia;     normal attention, concentration,     fund of knowledge Cranial Nerves:    The pupils are equal, round, and reactive to light. The fundi are normal and spontaneous venous pulsations are present. Visual fields are full to finger confrontation. Extraocular movements are  intact. Trigeminal sensation is intact and the muscles of mastication are normal. The face is symmetric. The palate elevates in the midline. Hearing intact. Voice is normal. Shoulder shrug is normal. The tongue has normal motion without fasciculations.   Coordination:    Normal finger to nose and heel to shin. Normal rapid alternating movements.   Gait:    Heel-toe and tandem gait are normal.   Motor Observation:    No asymmetry, no atrophy, and no involuntary movements noted. Tone:    Normal muscle tone.    Posture:    Posture is normal. normal erect    Strength:    Strength is V/V in the upper and lower limbs.      Sensation: intact to LT     Reflex Exam:  DTR's:    Deep tendon reflexes in the upper and lower extremities are normal bilaterally.   Toes:    The toes are downgoing bilaterally.   Clonus:    Clonus is absent.   ASSESSMENT/PLAN Carla Hanson is a 51 y.o. female with history of tobacco and alcohol abuse, pneumonia, obesity, hypertension, hyperlipidemia, bipolar disorder, and a hx of DVTs treated with Xarelto  presenting with a 3 day history of headaches and aphasia.  She did not receive IV t-PA due to [place].   Stroke: left parieto-occipital IPH secondary to Xarelto  associated coagulopathy. unlikely to be hypertensive given normal blood pressure or amyloid angiopathy given young age  Resultant mild aphasia  MRI - hemorrhage in the lateral left occipital lobe with surrounding edema and mild regional mass effect.   MRV - negative for underlying dural sinus thrombosis.  CTA Head - L occipital parietal hematoma stable. No evidence of underlying mass or vascular malformation.  Bilateral lower extremity duplex - negative for DVT  2D Echo - 11/01/2016 - EF 55-60%. No cardiac source of emboli identified.  On aspirin, follows with hematology  Continue with OT and speech  Continue with anti epileptic  Repeat CT head and EEG in 5 months, follow up in 6 months  Cc: Darci Needle, MD  Sarina Ill, MD [facility]  A total of 30 minutes was spent face-to-face with this patient. Over half this time was spent on counseling patient on the hemmorhagic stroke diagnosis and different diagnostic and therapeutic options available.

## 2017-01-01 NOTE — Therapy (Signed)
[facility] [address] [place], [place], [address] Phone: [phone]   Fax:  [phone]  Physical Therapy Evaluation  Patient Details  Name: Carla Hanson MRN: [id number] Date of Birth: 1966/01/31 Referring Provider: Antony Contras, MD  Encounter Date: 12/31/2016      PT End of Session - 01/01/17 0704    Visit Number 1   Number of Visits 3   Authorization Type Self-Pay   PT Start Time 1026   PT Stop Time 1055   PT Time Calculation (min) 29 min   Activity Tolerance Patient limited by fatigue;Patient tolerated treatment well   Behavior During Therapy [facility] for tasks assessed/performed      Past Medical History:  Diagnosis Date  . Anxiety   . Bipolar disorder ([place])   . Hyperlipidemia   . Hypertension   . Lipoma    Abdomen  . LIPOMA 01/20/2008  . Obesity   . Pneumonia   . Tobacco abuse     Past Surgical History:  Procedure Laterality Date  . CESAREAN SECTION    . lipoma removed  03/2011  . ORIF ANKLE FRACTURE  03/13/2012   Procedure: OPEN REDUCTION INTERNAL FIXATION (ORIF) ANKLE FRACTURE;  Surgeon: Jessy Oto, MD;  Location: [facility];  Service: Orthopedics;  Laterality: Right;    There were no vitals filed for this visit.       Subjective Assessment - 12/31/16 1028    Subjective She had ICH on 11/08/2016 and was hospitalized thru 11/13/2016. She reports increased balance issues since that time.    Pertinent History ICH, seizure, ORIF right ankle 03/13/2012, BiPolar, HTN, Lipoma, obesity, DVT   Limitations Lifting;Standing;Walking   Patient Stated Goals Walk without worrying falling.    Currently in Pain? No/denies            [facility] PT Assessment - 12/31/16 1020      Assessment   Medical Diagnosis ICH, seizure   Referring Provider Antony Contras, MD   Onset Date/Surgical Date 11/08/16   Hand Dominance Right     Precautions   Precautions Fall     Balance Screen   Has the patient fallen in the  past 6 months No   Has the patient had a decrease in activity level because of a fear of falling?  No   Is the patient reluctant to leave their home because of a fear of falling?  No     Home Environment   Living Environment Private residence   Living Arrangements Alone   Type of Home Apartment   Home Access Level entry   Home Layout One level   Home Equipment None     Prior Function   Level of Independence Independent     Posture/Postural Control   Posture/Postural Control Postural limitations   Postural Limitations Rounded Shoulders;Forward head;Increased lumbar lordosis     ROM / Strength   AROM / PROM / Strength AROM;Strength     AROM   Overall AROM  Within functional limits for tasks performed   Overall AROM Comments limits from morbid obesity but [facility]     Strength   Overall Strength Within functional limits for tasks performed   Overall Strength Comments BLEs WFL     Transfers   Transfers Sit to Stand;Stand to Sit   Sit to Stand 7: Independent;Without upper extremity assist;From chair/3-in-1   Stand to Sit 7: Independent;Without upper extremity assist;To chair/3-in-1     Ambulation/Gait   Ambulation/Gait Yes  Ambulation/Gait Assistance 6: Modified independent (Device/Increase time)   Ambulation/Gait Assistance Details Dyspnea 3/4 with gait assessment   Ambulation Distance (Feet) 400 Feet   Assistive device None   Gait Pattern Within Functional Limits;Wide base of support   Ambulation Surface Indoor;Level   Gait velocity 3.19 ft/sec   Stairs Yes   Stairs Assistance 6: Modified independent (Device/Increase time)   Stair Management Technique One rail Right;Alternating pattern;Forwards   Number of Stairs 4   Ramp 6: Modified independent (Device)  no device   Curb 6: Modified independent (Device/increase time)  no device     Standardized Balance Assessment   Standardized Balance Assessment Berg Balance Test     Berg Balance Test   Sit to Stand Able to stand  without using hands and stabilize independently   Standing Unsupported Able to stand safely 2 minutes   Sitting with Back Unsupported but Feet Supported on Floor or Stool Able to sit safely and securely 2 minutes   Stand to Sit Sits safely with minimal use of hands   Transfers Able to transfer safely, minor use of hands   Standing Unsupported with Eyes Closed Able to stand 10 seconds safely   Standing Ubsupported with Feet Together Able to place feet together independently and stand 1 minute safely   From Standing, Reach Forward with Outstretched Arm Can reach confidently >25 cm (10")   From Standing Position, Pick up Object from Floor Able to pick up shoe safely and easily   From Standing Position, Turn to Look Behind Over each Shoulder Looks behind from both sides and weight shifts well   Turn 360 Degrees Able to turn 360 degrees safely in 4 seconds or less   Standing Unsupported, Alternately Place Feet on Step/Stool Able to stand independently and safely and complete 8 steps in 20 seconds   Standing Unsupported, One Foot in Front Able to plae foot ahead of the other independently and hold 30 seconds   Standing on One Leg Tries to lift leg/unable to hold 3 seconds but remains standing independently   Total Score 52     Functional Gait  Assessment   Gait assessed  Yes   Gait Level Surface Walks 20 ft in less than 7 sec but greater than 5.5 sec, uses assistive device, slower speed, mild gait deviations, or deviates 6-10 in outside of the 12 in walkway width.   Change in Gait Speed Able to change speed, demonstrates mild gait deviations, deviates 6-10 in outside of the 12 in walkway width, or no gait deviations, unable to achieve a major change in velocity, or uses a change in velocity, or uses an assistive device.   Gait with Horizontal Head Turns Performs head turns smoothly with no change in gait. Deviates no more than 6 in outside 12 in walkway width   Gait with Vertical Head Turns Performs  head turns with no change in gait. Deviates no more than 6 in outside 12 in walkway width.   Gait and Pivot Turn Pivot turns safely within 3 sec and stops quickly with no loss of balance.   Step Over Obstacle Is able to step over one shoe box (4.5 in total height) without changing gait speed. No evidence of imbalance.   Gait with Narrow Base of Support Is able to ambulate for 10 steps heel to toe with no staggering.   Gait with Eyes Closed Walks 20 ft, no assistive devices, good speed, no evidence of imbalance, normal gait pattern, deviates no more than  6 in outside 12 in walkway width. Ambulates 20 ft in less than 7 sec.   Ambulating Backwards Walks 20 ft, uses assistive device, slower speed, mild gait deviations, deviates 6-10 in outside 12 in walkway width.   Steps Alternating feet, must use rail.   Total Score 25                                PT Long Term Goals - 12/31/16 1710      PT LONG TERM GOAL #1   Title Patient demonstrates & verbalizes understanding of ongoing HEP / fitness plan. Target 01/17/2017               Plan - 12/31/16 1705    Clinical Impression Statement This 51yo female reports decreased balance following ICH & seizure on 11/08/2016. She presents for PT evaluation to assess balance & gait. Berg Balance 52/56 indicates 25% fall risk. Functional Gait Assessment 25/30 indicates low fall risk with gait. She had dyspnea with mild exertion of gait & balance assessments. Patient would benefit from skilled PT instruction in fitness / exercise program to address flexibility, strength, balance & endurance.    Rehab Potential Good   PT Frequency 1x / week   PT Duration 2 weeks   PT Treatment/Interventions Gait training;Therapeutic exercise;Balance training;Patient/family education   PT Next Visit Plan Instruct in HEP to address flexibility, strength, balance & endurance.   Consulted and Agree with Plan of Care Patient      Patient will benefit  from skilled therapeutic intervention in order to improve the following deficits and impairments:  Decreased activity tolerance, Decreased strength, Impaired flexibility, Decreased mobility  Visit Diagnosis: Muscle weakness (generalized)  Unsteadiness on feet     Problem List Patient Active Problem List   Diagnosis Date Noted  . Iron deficiency anemia 12/12/2016  . Seizure ([place])   . ICH (intracerebral hemorrhage) ([place]) 11/08/2016  . CAP (community acquired pneumonia) 10/31/2016  . Dyspnea 10/31/2016  . Encounter for weight loss counseling 06/09/2016  . Tobacco abuse 05/10/2016  . Chronic obstructive pulmonary disease (COPD) ([place]) 05/10/2016  . RUQ abdominal pain 12/29/2013  . Left leg DVT ([place]) 09/29/2013  . Pre-diabetes 03/02/2013  . History of pulmonary embolism 05/05/2012  . Painful menstrual periods 01/05/2012  . Bipolar disorder ([place]) 07/03/2010  . History of tobacco abuse 08/23/2009  . Hyperlipidemia 04/30/2007  . OBESITY, NOS 01/01/2007  . HYPERTENSION, BENIGN SYSTEMIC 01/01/2007    Jamey Reas PT, DPT 01/01/2017, 7:12 AM  [facility] [address] [place], [place], [address] Phone: [phone]   Fax:  [phone]  Name: ILMA LIMBACH MRN: [id number] Date of Birth: 13-Apr-1966

## 2017-01-02 ENCOUNTER — Ambulatory Visit: Payer: Self-pay

## 2017-01-06 ENCOUNTER — Ambulatory Visit
Admission: RE | Admit: 2017-01-06 | Discharge: 2017-01-06 | Disposition: A | Discharge status: Home / Self Care | Payer: No Typology Code available for payment source | Source: Ambulatory Visit | Attending: Family Medicine | Admitting: Family Medicine

## 2017-01-06 ENCOUNTER — Ambulatory Visit (INDEPENDENT_AMBULATORY_CARE_PROVIDER_SITE_OTHER): Payer: Self-pay | Admitting: Internal Medicine

## 2017-01-06 ENCOUNTER — Encounter: Payer: Self-pay | Admitting: Internal Medicine

## 2017-01-06 ENCOUNTER — Telehealth: Payer: Self-pay | Admitting: Neurology

## 2017-01-06 VITALS — BP 130/88 | HR 88 | Temp 98.4°F | Ht 65.0 in | Wt 313.0 lb

## 2017-01-06 DIAGNOSIS — I611 Nontraumatic intracerebral hemorrhage in hemisphere, cortical: Secondary | ICD-10-CM

## 2017-01-06 DIAGNOSIS — Z1231 Encounter for screening mammogram for malignant neoplasm of breast: Secondary | ICD-10-CM

## 2017-01-06 DIAGNOSIS — R7303 Prediabetes: Secondary | ICD-10-CM

## 2017-01-06 DIAGNOSIS — R202 Paresthesia of skin: Secondary | ICD-10-CM

## 2017-01-06 DIAGNOSIS — E6609 Other obesity due to excess calories: Secondary | ICD-10-CM

## 2017-01-06 DIAGNOSIS — Z6841 Body Mass Index (BMI) 40.0 and over, adult: Secondary | ICD-10-CM

## 2017-01-06 DIAGNOSIS — R200 Anesthesia of skin: Secondary | ICD-10-CM

## 2017-01-06 DIAGNOSIS — IMO0001 Reserved for inherently not codable concepts without codable children: Secondary | ICD-10-CM

## 2017-01-06 DIAGNOSIS — R739 Hyperglycemia, unspecified: Secondary | ICD-10-CM

## 2017-01-06 LAB — POCT GLYCOSYLATED HEMOGLOBIN (HGB A1C): Hemoglobin A1C: 5.7

## 2017-01-06 MED ORDER — LEVETIRACETAM 500 MG PO TABS: 500.0000 mg | ORAL_TABLET | Freq: Two times a day (BID) | ORAL | 11 refills | Status: DC

## 2017-01-06 NOTE — Patient Instructions (Signed)
Ms. Frutos,  Please get your cholesterol checked before your visit Wednesday. I'll call you with those results.  A weight loss goal of about 30 lbs for now would help blood sugars a lot. I'll get back to you about meeting with Dr. Jenne Campus.  If you blood pressure is staying above 140/90 most times it's checked, let me know, and I'll suggest changes to your medicines.  Best, Dr. Ola Spurr

## 2017-01-06 NOTE — Progress Notes (Signed)
[facility] Progress Note  Subjective:  Carla Hanson is a 51 y.o. with history of bipolar disorder, HTN, hemorrhagic stroke, and obesity who presents for follow-up after stroke and complaint of numbness of buttocks/thighs/lower back when exercising.  Numbness: - Feels in buttocks/thighs/lower back when exercising - Noticed for the first time within the last week - Has started walking a lot more as part of strategy to lose weight and reduce stress - Sensation goes away after resting - Associated with a burning sensation as well - Denies pain - Does not radiate elsewhere ROS: No rash, no loss of bowel or bladder control  Recent Stroke: - Has not been screened for HLD and diabetes recently - Has quit smoking - Goal is to lose weight by increasing exercise and eating better. Used to speak more specifically about reducing portion sizes but now focusing on picking fresh options for snacks like fruit instead of chips - Is on aspirin and hopes to minimize need for more medications by losing weight - Is frustrated that her health has become so poor and is determined to focus more on herself than always helping others - Feels her strength in her L arm and speech are getting better and better with rehab   Allergies  Allergen Reactions  . Latuda [Lurasidone] Other (See Comments)    Made leg muscles twitch (went off this herself in December, 2017)    Objective: Blood pressure (!) 160/90, pulse 88, temperature 98.4 F (36.9 C), temperature source Oral, height 5\' 5"  (1.651 m), weight (!) 313 lb (142 kg), SpO2 98 %. Repeat 130/88. Body mass index is 52.09 kg/m. Constitutional: Obese female, in NAD Cardiovascular: RRR, S1, S2, no m/r/g.  Pulmonary/Chest: Effort normal and breath sounds normal. No respiratory distress.  Neurological: AOx3, left arm strength 5-/5 compared to 5/5 of right arm. Speech not slurred. Sensation intact across back, buttocks and thighs. No midline  spinal tenderness. No pain with internal or external rotation of thighs.  Psychiatric: Occasionally tearful.  Vitals reviewed  Assessment/Plan: Numbness and tingling - Occurring bilaterally in large muscle groups during exercise and not reproducible on exam. Improves with rest and not brought on by certain positions. - Suspect benign given unchanged neuro exam and normal gait. May be muscle fatigue from exercise. - Asked patient to continue to monitor for signs of focal numbness/weakness given recent stroke but to continue exercise  ICH (intracerebral hemorrhage) ([place]) - Discussed modifiable risk factors for stroke.  - Hgb A1c elevated at 5.7. Counseled on how weight loss can improve glycemic control with goal of 30 lb weight loss within near future.  - Patient would like to meet with nutritionist Dr. Jenne Campus, with whom she's worked in the past. Is currently uninsured so will ask Dr. Jenne Campus about availability in resident clinic with her. - To obtain lipid panel 3/7 when she can be fasting prior to appointment with Dr. Gwenlyn Saran in mood clinic - Introduced idea of bariatric surgery.   Follow-up prn. Will await lipid panel but plan to start statin given stroke and obesity -- had prescribed previously but patient never took.   Olene Floss, MD [place], PGY-2

## 2017-01-06 NOTE — Telephone Encounter (Signed)
Rx e-scribed to correct pharmacy per pt request.

## 2017-01-06 NOTE — Telephone Encounter (Signed)
Pt says levETIRAcetam (KEPPRA) 500 MG tablet should be sent to [facility].

## 2017-01-07 ENCOUNTER — Telehealth: Payer: Self-pay | Admitting: Internal Medicine

## 2017-01-07 ENCOUNTER — Encounter: Payer: Self-pay | Admitting: Internal Medicine

## 2017-01-07 ENCOUNTER — Ambulatory Visit: Payer: Self-pay | Attending: Neurology | Admitting: Occupational Therapy

## 2017-01-07 DIAGNOSIS — R41844 Frontal lobe and executive function deficit: Secondary | ICD-10-CM | POA: Insufficient documentation

## 2017-01-07 DIAGNOSIS — I69115 Cognitive social or emotional deficit following nontraumatic intracerebral hemorrhage: Secondary | ICD-10-CM | POA: Insufficient documentation

## 2017-01-07 DIAGNOSIS — M6281 Muscle weakness (generalized): Secondary | ICD-10-CM | POA: Insufficient documentation

## 2017-01-07 DIAGNOSIS — R41841 Cognitive communication deficit: Secondary | ICD-10-CM | POA: Insufficient documentation

## 2017-01-07 DIAGNOSIS — R200 Anesthesia of skin: Secondary | ICD-10-CM | POA: Insufficient documentation

## 2017-01-07 DIAGNOSIS — R202 Paresthesia of skin: Principal | ICD-10-CM

## 2017-01-07 NOTE — Therapy (Signed)
[facility] [address] [place], [place], [address] Phone: [phone]   Fax:  [phone]  Occupational Therapy Treatment  Patient Details  Name: Carla Hanson MRN: [id number] Date of Birth: 1966-03-21 Referring Provider: Dr. Leonie Man  Encounter Date: 01/07/2017      OT End of Session - 01/07/17 1225    Visit Number 6   Number of Visits 17   Date for OT Re-Evaluation 01/25/17   Authorization Type self pay   OT Start Time 1150  2 units, pt on phone   OT Stop Time 1230   OT Time Calculation (min) 40 min   Activity Tolerance Patient tolerated treatment well   Behavior During Therapy [facility] for tasks assessed/performed      Past Medical History:  Diagnosis Date  . Anxiety   . Bipolar disorder ([place])   . Hyperlipidemia   . Hypertension   . Lipoma    Abdomen  . LIPOMA 01/20/2008  . Obesity   . Pneumonia   . Tobacco abuse     Past Surgical History:  Procedure Laterality Date  . CESAREAN SECTION    . CYSTECTOMY    . LIPOMA EXCISION  03/2011  . ORIF ANKLE FRACTURE  03/13/2012   Procedure: OPEN REDUCTION INTERNAL FIXATION (ORIF) ANKLE FRACTURE;  Surgeon: Jessy Oto, MD;  Location: [facility];  Service: Orthopedics;  Laterality: Right;    There were no vitals filed for this visit.      Subjective Assessment - 01/07/17 1205    Subjective  Pt reports she is not having a good day,    Patient Stated Goals compensate for memory changes   Currently in Pain? No/denies            Treatment: BP 134/94 Pt reports her keppra made her dizzy today as she did not eat enough. Pt was offered crackers however she declined, pt was receptive to drinking a coke. Gentle strengthening exercises: shoulder flexion, chest press, biceps curls and wrist curls with bilateral UE's, 2 set of 10 reps with bilateral UE's, min v.c. Then pt returned demonstration. Pt planned a meal and generated a corresponding grocery list with only 1  v.c. In a min distracting environment. Word search for improved attention, pt attended to task for 8 mins without only min v.c. To locate several words .and she did not demonstrate frustration.                     OT Short Term Goals - 01/07/17 1158      OT SHORT TERM GOAL #1   Title Pt will verbalize understanding of memory and cognitive compensation strategies.   Time 4   Status On-going     OT SHORT TERM GOAL #2   Title Pt will demonstrate ability to perform basic cooking with supervision/ min v.c.   Time 4   Period Weeks   Status Achieved     OT SHORT TERM GOAL #3   Title Pt will demonstrate ability to perform basic financial task such as change making at a modified independent level   Time 4   Period Weeks   Status Partially Met  Pt reports perfroming with debit card, unable to make change, however able to pay with correct amount.           OT Long Term Goals - 01/07/17 1159      OT LONG TERM GOAL #1   Title Pt will demonstrate selective attention and sufficent  frustration tolerance in order to  to complete a functional organization/ problem solving task  x15 mins  in a busy environment   Time 8   Period Weeks   Status On-going  Pt performed in a minimally distractting environment     OT LONG TERM GOAL #2   Title Pt will demonstrate ability to perform basic cooking at a modified independent level demonstrating good safety awareness.   Time 8   Period Weeks   Status On-going     OT LONG TERM GOAL #3   Title Pt will report she has resumed performance of home management activities at a modiifed independent level.   Time 8   Period Weeks   Status Achieved     OT LONG TERM GOAL #4   Title I with HEP for increased strength and endurance  in prep for ADLs.   Time 8   Period Weeks   Status Achieved               Plan - 01/07/17 1223    Clinical Impression Statement Pt is progressing towards goals. Pt is in agreement with plans to d/c from  OT next week.   Rehab Potential Fair   OT Frequency 2x / week   OT Duration 8 weeks   OT Treatment/Interventions Self-care/ADL training;DME and/or AE instruction;Patient/family education;Therapeutic activities;Cognitive remediation/compensation;Neuromuscular education;Cryotherapy;Energy conservation;Manual Therapy;Visual/perceptual remediation/compensation   Plan simple cooking task  follow instructions.   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 Numbness and tingling 01/07/2017  . Iron deficiency anemia 12/12/2016  . Seizure ([place])   . ICH (intracerebral hemorrhage) ([place]) 11/08/2016  . CAP (community acquired pneumonia) 10/31/2016  . Dyspnea 10/31/2016  . Encounter for weight loss counseling 06/09/2016  . Tobacco abuse 05/10/2016  . Chronic obstructive pulmonary disease (COPD) ([place]) 05/10/2016  . RUQ abdominal pain 12/29/2013  . Left leg DVT ([place]) 09/29/2013  . Pre-diabetes 03/02/2013  . History of pulmonary embolism 05/05/2012  . Painful menstrual periods 01/05/2012  . Bipolar disorder ([place]) 07/03/2010  . History of tobacco abuse 08/23/2009  . Hyperlipidemia 04/30/2007  . OBESITY, NOS 01/01/2007  . HYPERTENSION, BENIGN SYSTEMIC 01/01/2007    RINE,KATHRYN 01/07/2017, 12:26 PM  [facility] [address] [place], [place], [address] Phone: [phone]   Fax:  [phone]  Name: Carla Hanson MRN:  [id number] Date of Birth: [birth year]

## 2017-01-07 NOTE — Assessment & Plan Note (Addendum)
-   Discussed modifiable risk factors for stroke.  - Hgb A1c elevated at 5.7. Counseled on how weight loss can improve glycemic control with goal of 30 lb weight loss within near future.  - Patient would like to meet with nutritionist Dr. Jenne Campus, with whom she's worked in the past. Is currently uninsured so will ask Dr. Jenne Campus about availability in resident clinic with her. - To obtain lipid panel 3/7 when she can be fasting prior to appointment with Dr. Gwenlyn Saran in mood clinic - Introduced idea of bariatric surgery.

## 2017-01-07 NOTE — Assessment & Plan Note (Signed)
-   Occurring bilaterally in large muscle groups during exercise and not reproducible on exam. Improves with rest and not brought on by certain positions. - Suspect benign given unchanged neuro exam and normal gait. May be muscle fatigue from exercise. - Asked patient to continue to monitor for signs of focal numbness/weakness given recent stroke but to continue exercise

## 2017-01-08 ENCOUNTER — Other Ambulatory Visit: Payer: Self-pay

## 2017-01-08 ENCOUNTER — Ambulatory Visit (INDEPENDENT_AMBULATORY_CARE_PROVIDER_SITE_OTHER): Payer: Self-pay | Admitting: Psychology

## 2017-01-08 DIAGNOSIS — F3176 Bipolar disorder, in full remission, most recent episode depressed: Secondary | ICD-10-CM

## 2017-01-08 DIAGNOSIS — F1021 Alcohol dependence, in remission: Secondary | ICD-10-CM

## 2017-01-08 DIAGNOSIS — IMO0001 Reserved for inherently not codable concepts without codable children: Secondary | ICD-10-CM

## 2017-01-08 LAB — LIPID PANEL
CHOL/HDL RATIO: 6.3 ratio — AB (ref <5.0)
Cholesterol: 213 mg/dL — ABNORMAL HIGH (ref <200)
HDL: 34 mg/dL — AB (ref >50)
LDL CALC: 139 mg/dL — AB (ref <100)
Triglycerides: 199 mg/dL — ABNORMAL HIGH (ref <150)
VLDL: 40 mg/dL — AB (ref <30)

## 2017-01-08 NOTE — Assessment & Plan Note (Signed)
Patient reported that the minimized the amount of beer she was drinking previously.  Today she reported she drank more than a 12 pack daily.  She used it so she wouldn't have to think.  She would like to abstain.  She feels better in her mind and her body without the alcohol and is learning to "face things."  Her irritability and guardedness was a major barrier to progress in treatment.  Today she presents as open and euthymic with a bright affect.  She reports no desire to drink alcohol presently.  Still hangs out with people who do.

## 2017-01-08 NOTE — Patient Instructions (Signed)
Please schedule a follow-up for May 2nd at 11:00.  Call me at [phone] if you need to talk prior to.  I can schedule to see you individually (without Dr. Tammi Klippel) to talk about the changes you are making and how you are handling them.

## 2017-01-08 NOTE — Progress Notes (Signed)
Reason for follow-up:  Patient was followed in [facility] since around 2013.  She presents today after a break secondary to having a stroke.    Issues discussed:  Her mood is significantly improved.  She reports she no longer drinks any alcohol and has stopped smoking.  She feels as though there has been an "awakening" with this stroke.  She reported the stroke was the scariest thing she had ever encountered.  She was happy to see her mom, children, fathers of her children, and friends around her bedside in the hospital.  She is recovering from her stroke, taking advantage of rehab and hoping to improve her diet through meeting with Dr. Jenne Campus.

## 2017-01-08 NOTE — Assessment & Plan Note (Addendum)
Not currently on any mood medicines per her report.  Affect is bright.  Mood is reported as euthymic.  She reports some down days but for the most part, she is feeling so much better than prior to her stroke.  Is connecting more with people including her sister.  Is working toward improved health.    We are mindful of the incidence of depression after stroke.  Will continue to monitor her.  Presently, we believe the abstinence from alcohol and perhaps the Kepra, are having a significantly positive effect on her mood.    Will follow on May 2nd in [facility].  She is welcome to call to schedule with me for an Integrated Care appointment prior to if needed.

## 2017-01-09 ENCOUNTER — Encounter: Payer: Self-pay | Admitting: Speech Pathology

## 2017-01-09 ENCOUNTER — Ambulatory Visit: Payer: Self-pay | Admitting: Physical Therapy

## 2017-01-09 NOTE — Telephone Encounter (Signed)
Pt would like to talk to dr fitzgerald.  Dr fitzgerald was to talk to dr Jenne Campus about a visit because the pt is self pay.

## 2017-01-10 ENCOUNTER — Encounter: Payer: Self-pay | Admitting: Occupational Therapy

## 2017-01-10 NOTE — Telephone Encounter (Signed)
Please call patient and let her know next available date of resident clinic is the morning of 4/12 (Thurs) with Dr. Lorenso Courier if she is available for an appointment.   Spoke with patient 01/09/2017. I let her know I had contacted Dr. Jenne Campus. We reviewed her cholesterol results, but she declines to start a statin at this time and prefers to focus on losing weight.

## 2017-01-13 ENCOUNTER — Ambulatory Visit: Payer: Self-pay | Admitting: Speech Pathology

## 2017-01-13 NOTE — Telephone Encounter (Signed)
**   Clarification. This appointment would be with Dr. Jenne Campus, along with Dr. Lorenso Courier. Please help pt schedule this appointment.

## 2017-01-14 ENCOUNTER — Ambulatory Visit: Payer: Self-pay | Admitting: Occupational Therapy

## 2017-01-14 NOTE — Telephone Encounter (Signed)
Patient already has appointment scheduled for 4/26 with [facility].

## 2017-01-16 ENCOUNTER — Ambulatory Visit: Payer: Self-pay | Admitting: Occupational Therapy

## 2017-01-16 ENCOUNTER — Ambulatory Visit: Payer: Self-pay | Admitting: Speech Pathology

## 2017-01-16 ENCOUNTER — Ambulatory Visit: Payer: Self-pay | Admitting: Physical Therapy

## 2017-01-16 DIAGNOSIS — R41841 Cognitive communication deficit: Secondary | ICD-10-CM

## 2017-01-16 NOTE — Therapy (Signed)
[facility] [address] [place], [place], [address] Phone: [phone]   Fax:  [phone]  Speech Language Pathology Treatment  Patient Details  Name: Carla Hanson MRN: [id number] Date of Birth: 11-Jul-1966 Referring Provider: Garvin Fila, MD  Encounter Date: 01/16/2017      End of Session - 01/16/17 1049    Visit Number 3   Number of Visits 17   Date for SLP Re-Evaluation 02/21/17   Authorization Type self pay   SLP Start Time 0935   SLP Stop Time  1020   SLP Time Calculation (min) 45 min   Activity Tolerance Patient tolerated treatment well      Past Medical History:  Diagnosis Date  . Anxiety   . Bipolar disorder ([place])   . Hyperlipidemia   . Hypertension   . Lipoma    Abdomen  . LIPOMA 01/20/2008  . Obesity   . Pneumonia   . Tobacco abuse     Past Surgical History:  Procedure Laterality Date  . CESAREAN SECTION    . CYSTECTOMY    . LIPOMA EXCISION  03/2011  . ORIF ANKLE FRACTURE  03/13/2012   Procedure: OPEN REDUCTION INTERNAL FIXATION (ORIF) ANKLE FRACTURE;  Surgeon: Jessy Oto, MD;  Location: [facility];  Service: Orthopedics;  Laterality: Right;    There were no vitals filed for this visit.      Subjective Assessment - 01/16/17 0934    Subjective "I think it's getting a little better... I'm working on writing things down more."   Currently in Pain? No/denies               ADULT SLP TREATMENT - 01/16/17 0936      General Information   Behavior/Cognition Alert;Cooperative;Pleasant mood   Patient Positioning Upright in chair     Treatment Provided   Treatment provided Cognitive-Linquistic     Pain Assessment   Pain Assessment No/denies pain     Cognitive-Linquistic Treatment   Treatment focused on Cognition;Patient/family/caregiver education   Skilled Treatment SLP facilitated patient's recall of targets in previous therapy session. Patient required verbal cue to recall  making a list with clinician of strategies to improve her memory and attention. Patient recalled only one strategy from the list, stating, "I think she put down some stuff about going to a quiet place." Provided patient with verbal education and a typed handout of attention and memory strategies. Targeted use of attention/memory strategies with visual recall activity. Patient required moderate cues to initiate and persist in the task, and to utilize suggested memory strategies for recall. Recall was 100%; patient required moderate cues to state the strategies used to facilitate recall. Functional short-term recall of grocery list items 80%, with verbal cues to use association, categorization. Provided patient with calendar worksheet for home exercise and instructed her to use for recording daily activities and memory lapses.     Assessment / Recommendations / Plan   Plan Continue with current plan of care     Progression Toward Goals   Progression toward goals Progressing toward goals          SLP Education - 01/16/17 1048    Education provided Yes   Education Details compensatory aid, HEP, attention and memory strategies   Person(s) Educated Patient   Methods Explanation;Demonstration;Verbal cues;Handout   Comprehension Verbalized understanding;Need further instruction;Verbal cues required          SLP Short Term Goals - 01/16/17 1053  SLP SHORT TERM GOAL #1   Title Patient will verbalize 4 strategies to improve attention over 2 sessions   Time 3   Period Weeks   Status On-going     SLP SHORT TERM GOAL #2   Title Patient will demo use of compensatory aids and strategies to improve attention and executive function for 85% accuracy during functional tasks with mod A    Time 3   Period Weeks   Status On-going          SLP Long Term Goals - 01/16/17 1053      SLP LONG TERM GOAL #1   Title Patient will report use of compensatory aids and strategies for attention outside of  therapy tasks for 50% perceived improvement in functioning at home   Time 7   Period Weeks   Status On-going          Plan - 01/16/17 1050    Clinical Impression Statement Pt reports she feels her memory is improving, however states "I forget something every day." She is unable to state what things she is forgetting.  She is able to identify strategies she can use to improve her attention and memory, however requires moderate cues to utilize these in basic cognitive tasks. She appears reluctant to utilize compensatory aid stating it would make her "anxious" to carry a calendar. Continued ST intervention is recommended, targeting functional improvement of memory and attention.   Speech Therapy Frequency 2x / week   Treatment/Interventions Cueing hierarchy;SLP instruction and feedback;Functional tasks;Cognitive reorganization;Compensatory strategies;Patient/family education;Internal/external aids   Potential to Achieve Goals Good   Potential Considerations Cooperation/participation level;Family/community support;Ability to learn/carryover information   SLP Home Exercise Plan reviewed and provided    Consulted and Agree with Plan of Care Patient      Patient will benefit from skilled therapeutic intervention in order to improve the following deficits and impairments:   Cognitive communication deficit    Problem List Patient Active Problem List   Diagnosis Date Noted  . Numbness and tingling 01/07/2017  . Iron deficiency anemia 12/12/2016  . Seizure ([place])   . ICH (intracerebral hemorrhage) ([place]) 11/08/2016  . CAP (community acquired pneumonia) 10/31/2016  . Dyspnea 10/31/2016  . Encounter for weight loss counseling 06/09/2016  . Tobacco abuse 05/10/2016  . Chronic obstructive pulmonary disease (COPD) ([place]) 05/10/2016  . History of alcohol dependence ([place]) 01/11/2016  . RUQ abdominal pain 12/29/2013  . Left leg DVT ([place]) 09/29/2013  . Pre-diabetes 03/02/2013  . History of  pulmonary embolism 05/05/2012  . Painful menstrual periods 01/05/2012  . Bipolar disorder ([place]) 07/03/2010  . History of tobacco abuse 08/23/2009  . Hyperlipidemia 04/30/2007  . OBESITY, NOS 01/01/2007  . HYPERTENSION, BENIGN SYSTEMIC 01/01/2007   Deneise Lever, [place] CF-SLP Speech-Language Pathologist  Aliene Altes 01/16/2017, 10:55 AM  [facility] [address] [place], [place], [address] Phone: [phone]   Fax:  [phone]   Name: Carla Hanson MRN: [id number] Date of Birth: 1966-09-22

## 2017-01-20 ENCOUNTER — Ambulatory Visit: Payer: Self-pay | Admitting: Speech Pathology

## 2017-01-23 ENCOUNTER — Ambulatory Visit: Payer: Self-pay | Admitting: Speech Pathology

## 2017-01-23 DIAGNOSIS — R41841 Cognitive communication deficit: Secondary | ICD-10-CM
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
## 2017-01-27 ENCOUNTER — Ambulatory Visit: Payer: Self-pay | Admitting: Speech Pathology

## 2017-01-27 DIAGNOSIS — R41841 Cognitive communication deficit: Secondary | ICD-10-CM
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
## 2017-01-28 DIAGNOSIS — Z0271 Encounter for disability determination: Secondary | ICD-10-CM

## 2017-01-30 ENCOUNTER — Ambulatory Visit: Payer: Self-pay | Admitting: Speech Pathology

## 2017-01-30 DIAGNOSIS — R41841 Cognitive communication deficit: Secondary | ICD-10-CM

## 2017-01-30 NOTE — Therapy (Signed)
[facility] [address] [place], [place], [address] Phone: [phone]   Fax:  [phone]  Speech Language Pathology Treatment  Patient Details  Name: Carla Hanson MRN: [id number] Date of Birth: 1965/12/16 Referring Provider: Garvin Fila, MD  Encounter Date: 01/30/2017      End of Session - 01/30/17 1303    Visit Number 6   Number of Visits 17   Date for SLP Re-Evaluation 02/21/17   Authorization Type self pay   SLP Start Time 1018   SLP Stop Time  1100   SLP Time Calculation (min) 42 min   Activity Tolerance Patient tolerated treatment well;Other (comment)  pt limited by frustration tolerance      Past Medical History:  Diagnosis Date  . Anxiety   . Bipolar disorder ([place])   . Hyperlipidemia   . Hypertension   . Lipoma    Abdomen  . LIPOMA 01/20/2008  . Obesity   . Pneumonia   . Tobacco abuse     Past Surgical History:  Procedure Laterality Date  . CESAREAN SECTION    . CYSTECTOMY    . LIPOMA EXCISION  03/2011  . ORIF ANKLE FRACTURE  03/13/2012   Procedure: OPEN REDUCTION INTERNAL FIXATION (ORIF) ANKLE FRACTURE;  Surgeon: Jessy Oto, MD;  Location: [facility];  Service: Orthopedics;  Laterality: Right;    There were no vitals filed for this visit.      Subjective Assessment - 01/30/17 1021    Subjective "I'm fine."   Currently in Pain? No/denies               ADULT SLP TREATMENT - 01/30/17 1030      General Information   Behavior/Cognition Alert;Cooperative   Patient Positioning Upright in chair   Oral care provided N/A     Treatment Provided   Treatment provided Cognitive-Linquistic     Pain Assessment   Pain Assessment No/denies pain     Cognitive-Linquistic Treatment   Treatment focused on Cognition;Patient/family/caregiver education   Skilled Treatment Pt returns with reasoning task she completed for homework, which she completed with 75% accuracy. SLP facilitated  awareness of incorrect answers, pt with low frustration tolerance as she attempted to identify alternative. With min-moderate cues, pt identified daily tasks which may be impacted by difficulties with reasoning, attention. SLP initially requested pt to create a list of details she should attend to for functional tasks. Pt stated, "I don't feel like this today. I can't. My brain doesn't want to." She was willing to do the task verbally but required moderate cues. Made appropriate inferences for functional scenarios with 80% accuracy, min cues. Executive function task for meal planning using weekly sales flyer; required verbal cues for initiation. Pt instructed to complete the activity for home exercise.      Assessment / Recommendations / Plan   Plan Continue with current plan of care     Progression Toward Goals   Progression toward goals Progressing toward goals          SLP Education - 01/30/17 1302    Education provided Yes   Education Details use of reasoning, problem solving in daily life   Person(s) Educated Patient   Methods Explanation;Verbal cues   Comprehension Verbalized understanding          SLP Short Term Goals - 01/30/17 1305      SLP SHORT TERM GOAL #1   Title Patient will verbalize 4 strategies to improve attention over 2  sessions   Time 2   Period Weeks   Status On-going     SLP SHORT TERM GOAL #2   Title Patient will demo use of compensatory aids and strategies to improve attention and executive function for 85% accuracy during functional tasks with mod A    Time 2   Period Weeks   Status On-going     SLP SHORT TERM GOAL #3   Title Pt will complete simple thought organization, problem solving and reasoning tasks with 90% accuracy given mod cues   Time 3   Period Weeks   Status On-going          SLP Long Term Goals - 01/30/17 1306      SLP LONG TERM GOAL #1   Title Patient will report use of compensatory aids and strategies for memory, attention and  problem solving outside of therapy tasks for 50% perceived improvement in functioning at home   Period Weeks   Status On-going          Plan - 01/30/17 1304    Clinical Impression Statement Pt continues to display impairments in attention, memory, executive function and awareness. Followed up with patient re: watching her grandson alone, she says her mother is "usually" there. Recommended pt have someone else in the home when she is minding her grandson. Continued ST intervention is recommended, targeting memory, attention, executive function deficits.   Speech Therapy Frequency 2x / week   Duration Other (comment)   Treatment/Interventions Cueing hierarchy;SLP instruction and feedback;Functional tasks;Cognitive reorganization;Compensatory strategies;Patient/family education;Internal/external aids   Potential to Achieve Goals Good   Potential Considerations Cooperation/participation level;Family/community support;Ability to learn/carryover information   SLP Home Exercise Plan reasoning and planning tasks   Consulted and Agree with Plan of Care Patient      Patient will benefit from skilled therapeutic intervention in order to improve the following deficits and impairments:   Cognitive communication deficit    Problem List Patient Active Problem List   Diagnosis Date Noted  . Numbness and tingling 01/07/2017  . Iron deficiency anemia 12/12/2016  . Seizure ([place])   . ICH (intracerebral hemorrhage) ([place]) 11/08/2016  . CAP (community acquired pneumonia) 10/31/2016  . Dyspnea 10/31/2016  . Encounter for weight loss counseling 06/09/2016  . Tobacco abuse 05/10/2016  . Chronic obstructive pulmonary disease (COPD) ([place]) 05/10/2016  . History of alcohol dependence ([place]) 01/11/2016  . RUQ abdominal pain 12/29/2013  . Left leg DVT ([place]) 09/29/2013  . Pre-diabetes 03/02/2013  . History of pulmonary embolism 05/05/2012  . Painful menstrual periods 01/05/2012  . Bipolar disorder ([place])  07/03/2010  . History of tobacco abuse 08/23/2009  . Hyperlipidemia 04/30/2007  . OBESITY, NOS 01/01/2007  . HYPERTENSION, BENIGN SYSTEMIC 01/01/2007   Deneise Lever, [place] CF-SLP Speech-Language Pathologist  Aliene Altes 01/30/2017, 1:07 PM  [facility] [address] [place], [place], [address] Phone: [phone]   Fax:  [phone]   Name: LEVAEH VICE MRN: [id number] Date of Birth: 1966-03-10

## 2017-02-03 ENCOUNTER — Ambulatory Visit: Payer: Self-pay | Attending: Neurology | Admitting: Speech Pathology

## 2017-02-03 DIAGNOSIS — R41841 Cognitive communication deficit: Secondary | ICD-10-CM | POA: Insufficient documentation

## 2017-02-03 NOTE — Therapy (Signed)
[facility] [address] [place], [place], [address] Phone: [phone]   Fax:  [phone]  Speech Language Pathology Treatment  Patient Details  Name: Carla Hanson MRN: [id number] Date of Birth: September 13, 1966 Referring Provider: Garvin Fila, MD  Encounter Date: 02/03/2017      End of Session - 02/03/17 1324    Visit Number 7   Number of Visits 17   Date for SLP Re-Evaluation 02/21/17   Authorization Type self pay   SLP Start Time 1154   SLP Stop Time  1232   SLP Time Calculation (min) 38 min   Activity Tolerance Patient tolerated treatment well      Past Medical History:  Diagnosis Date  . Anxiety   . Bipolar disorder ([place])   . Hyperlipidemia   . Hypertension   . Lipoma    Abdomen  . LIPOMA 01/20/2008  . Obesity   . Pneumonia   . Tobacco abuse     Past Surgical History:  Procedure Laterality Date  . CESAREAN SECTION    . CYSTECTOMY    . LIPOMA EXCISION  03/2011  . ORIF ANKLE FRACTURE  03/13/2012   Procedure: OPEN REDUCTION INTERNAL FIXATION (ORIF) ANKLE FRACTURE;  Surgeon: Jessy Oto, MD;  Location: [facility];  Service: Orthopedics;  Laterality: Right;    There were no vitals filed for this visit.      Subjective Assessment - 02/03/17 1156    Subjective "I almost burnt the gravy." "I gotta stop getting on the phone when I'm cooking, that's just not an option for me."   Currently in Pain? No/denies               ADULT SLP TREATMENT - 02/03/17 1316      General Information   Behavior/Cognition Alert;Cooperative   Patient Positioning Upright in chair   Oral care provided N/A     Treatment Provided   Treatment provided Cognitive-Linquistic     Pain Assessment   Pain Assessment No/denies pain     Cognitive-Linquistic Treatment   Treatment focused on Cognition;Patient/family/caregiver education   Skilled Treatment Patient describes cooking a meal at home last night, states a  phone call with her mother nearly caused her to burn something. She states she knows she should avoid this, states, "my focus is off." With verbal cues, pt identifies functional impact of attention impairment on reading, conversations, and daily tasks. With moderate cues, pt states 4 strategies to improve attention/memory. Provided additional handout with strategies; pt states "I need to put this on my fridge." Again introduced concept of using compensatory aid/notebook to improve functional recall, attention. Pt is more receptive of this strategy today and states she will begin using one this week. SLP facilitated use of strategies in verbal information retention activity. Patient implemented use of attention strategies during paragraph-level listening activity and answered detailed Y/N questions with 90% accuracy, multiple choice with 80% accuracy.     Assessment / Recommendations / Plan   Plan Continue with current plan of care     Progression Toward Goals   Progression toward goals Progressing toward goals          SLP Education - 02/03/17 1323    Education provided Yes   Education Details attention/memory strategies   Person(s) Educated Patient   Methods Explanation;Demonstration;Verbal cues;Handout   Comprehension Verbalized understanding;Returned demonstration          SLP Short Term Goals - 02/03/17 1328  SLP SHORT TERM GOAL #1   Title Patient will verbalize 4 strategies to improve attention over 2 sessions   Baseline 4.2.18   Time 1   Period Weeks   Status On-going     SLP SHORT TERM GOAL #2   Title Patient will demo use of compensatory aids and strategies to improve attention and executive function for 85% accuracy during functional tasks with mod A    Time 1   Period Weeks   Status On-going     SLP SHORT TERM GOAL #3   Title Pt will complete simple thought organization, problem solving and reasoning tasks with 90% accuracy given mod cues   Time 3   Period Weeks    Status On-going          SLP Long Term Goals - 02/03/17 1330      SLP LONG TERM GOAL #1   Title Patient will report use of compensatory aids and strategies for memory, attention and problem solving outside of therapy tasks for 50% perceived improvement in functioning at home   Time 5   Period Weeks   Status On-going          Plan - 02/03/17 1325    Clinical Impression Statement Pt continues to display impairments in attention, memory, executive function and awareness. Patient more open today and acknowledges several deficit areas. Frustration tolerance did not interfere in today's session; she was receptive to use of strategies and recommended compensations. Continued ST intervention is recommended, targeting memory, attention, executive function deficits.   Speech Therapy Frequency 2x / week   Duration Other (comment)   Treatment/Interventions Cueing hierarchy;SLP instruction and feedback;Functional tasks;Cognitive reorganization;Compensatory strategies;Patient/family education;Internal/external aids   Potential to Achieve Goals Good   Potential Considerations Cooperation/participation level;Family/community support;Ability to learn/carryover information   SLP Home Exercise Plan begin using notebook to write information/to do lists   Consulted and Agree with Plan of Care Patient      Patient will benefit from skilled therapeutic intervention in order to improve the following deficits and impairments:   Cognitive communication deficit    Problem List Patient Active Problem List   Diagnosis Date Noted  . Numbness and tingling 01/07/2017  . Iron deficiency anemia 12/12/2016  . Seizure ([place])   . ICH (intracerebral hemorrhage) ([place]) 11/08/2016  . CAP (community acquired pneumonia) 10/31/2016  . Dyspnea 10/31/2016  . Encounter for weight loss counseling 06/09/2016  . Tobacco abuse 05/10/2016  . Chronic obstructive pulmonary disease (COPD) ([place]) 05/10/2016  . History of  alcohol dependence ([place]) 01/11/2016  . RUQ abdominal pain 12/29/2013  . Left leg DVT ([place]) 09/29/2013  . Pre-diabetes 03/02/2013  . History of pulmonary embolism 05/05/2012  . Painful menstrual periods 01/05/2012  . Bipolar disorder ([place]) 07/03/2010  . History of tobacco abuse 08/23/2009  . Hyperlipidemia 04/30/2007  . OBESITY, NOS 01/01/2007  . HYPERTENSION, BENIGN SYSTEMIC 01/01/2007   Deneise Lever, [place] CF-SLP Speech-Language Pathologist  Aliene Altes 02/03/2017, 1:31 PM  [facility] [address] [place], [place], [address] Phone: [phone]   Fax:  [phone]   Name: Carla Hanson MRN: [id number] Date of Birth: May 20, 1966

## 2017-02-06 ENCOUNTER — Ambulatory Visit: Payer: Self-pay | Admitting: Speech Pathology

## 2017-02-06 DIAGNOSIS — R41841 Cognitive communication deficit: Secondary | ICD-10-CM

## 2017-02-06 NOTE — Therapy (Signed)
[facility] [address] [place], [place], [address] Phone: [phone]   Fax:  [phone]  Speech Language Pathology Treatment  Patient Details  Name: Carla Hanson MRN: [id number] Date of Birth: 03/21/1966 Referring Provider: Garvin Fila, MD  Encounter Date: 02/06/2017      End of Session - 02/06/17 1159    Visit Number 8   Number of Visits 17   Date for SLP Re-Evaluation 02/21/17   Authorization Type self pay   SLP Start Time 1108   SLP Stop Time  1145   SLP Time Calculation (min) 37 min   Activity Tolerance Patient tolerated treatment well;Patient limited by fatigue      Past Medical History:  Diagnosis Date  . Anxiety   . Bipolar disorder ([place])   . Hyperlipidemia   . Hypertension   . Lipoma    Abdomen  . LIPOMA 01/20/2008  . Obesity   . Pneumonia   . Tobacco abuse     Past Surgical History:  Procedure Laterality Date  . CESAREAN SECTION    . CYSTECTOMY    . LIPOMA EXCISION  03/2011  . ORIF ANKLE FRACTURE  03/13/2012   Procedure: OPEN REDUCTION INTERNAL FIXATION (ORIF) ANKLE FRACTURE;  Surgeon: Jessy Oto, MD;  Location: [facility];  Service: Orthopedics;  Laterality: Right;    There were no vitals filed for this visit.      Subjective Assessment - 02/06/17 1108    Subjective "I actually forgot what I was supposed to do"   Currently in Pain? No/denies               ADULT SLP TREATMENT - 02/06/17 0001      General Information   Behavior/Cognition Alert;Cooperative   Patient Positioning Upright in chair     Treatment Provided   Treatment provided Cognitive-Linquistic     Pain Assessment   Pain Assessment No/denies pain     Cognitive-Linquistic Treatment   Treatment focused on Cognition;Patient/family/caregiver education   Skilled Treatment Patient returns with no home tasks completed, stating, "I know you told me to do something and I should have written it down." States  she has been focusing on her attention strategies and lists several she has been attempting to use at home. Attempted simple thought organization and problem solving tasks; pt required max cues for intiation and has poor participation due to low frustration tolerance. She states she has not been sleeping well because her daughter and grandson have been staying with her for several days. Educated patient on impact of poor sleep on cognitive function, recommended strategies including scheduling rest breaks, planning activites for periods when she is more alert. With min verbal cue, patient wrote a list of home tasks to begin prior to next session.      Assessment / Recommendations / Plan   Plan Continue with current plan of care     Progression Toward Goals   Progression toward goals Progressing toward goals          SLP Education - 02/06/17 1159    Education provided Yes   Education Details sleep hygiene and cognitive function   Person(s) Educated Patient   Methods Explanation   Comprehension Verbalized understanding          SLP Short Term Goals - 02/06/17 1201      SLP SHORT TERM GOAL #1   Title Patient will verbalize 4 strategies to improve attention over 2 sessions   Baseline 4.2.18,  4.5.18   Time 1   Period Weeks   Status Achieved     SLP SHORT TERM GOAL #2   Title Patient will demo use of compensatory aids and strategies to improve attention and executive function for 85% accuracy during functional tasks with mod A    Time 1   Period Weeks   Status On-going     SLP SHORT TERM GOAL #3   Title Pt will complete simple thought organization, problem solving and reasoning tasks with 90% accuracy given mod cues   Time 2   Period Weeks   Status On-going          SLP Long Term Goals - 02/06/17 1202      SLP LONG TERM GOAL #1   Title Patient will report use of compensatory aids and strategies for memory, attention and problem solving outside of therapy tasks for 50%  perceived improvement in functioning at home   Time 5   Period Weeks   Status On-going          Plan - 02/06/17 1159    Clinical Impression Statement Pt continues to display impairments in attention, memory, executive function and awareness. Patient acknowledges deficits and states she should have used strategies to remember her home tasks for this week. Frustration tolerance low today; suspect poor sleep in the last few days maybe be impacting her attention and frustration tolerance. Continued ST intervention is recommended, targeting memory, attention, executive function deficits.   Speech Therapy Frequency 2x / week   Duration Other (comment)   Treatment/Interventions Cueing hierarchy;SLP instruction and feedback;Functional tasks;Cognitive reorganization;Compensatory strategies;Patient/family education;Internal/external aids   Potential to Achieve Goals Good   Potential Considerations Cooperation/participation level;Family/community support;Ability to learn/carryover information   SLP Home Exercise Plan begin using notebook to write information/to do lists   Consulted and Agree with Plan of Care Patient      Patient will benefit from skilled therapeutic intervention in order to improve the following deficits and impairments:   Cognitive communication deficit    Problem List Patient Active Problem List   Diagnosis Date Noted  . Numbness and tingling 01/07/2017  . Iron deficiency anemia 12/12/2016  . Seizure ([place])   . ICH (intracerebral hemorrhage) ([place]) 11/08/2016  . CAP (community acquired pneumonia) 10/31/2016  . Dyspnea 10/31/2016  . Encounter for weight loss counseling 06/09/2016  . Tobacco abuse 05/10/2016  . Chronic obstructive pulmonary disease (COPD) ([place]) 05/10/2016  . History of alcohol dependence ([place]) 01/11/2016  . RUQ abdominal pain 12/29/2013  . Left leg DVT ([place]) 09/29/2013  . Pre-diabetes 03/02/2013  . History of pulmonary embolism 05/05/2012  . Painful  menstrual periods 01/05/2012  . Bipolar disorder ([place]) 07/03/2010  . History of tobacco abuse 08/23/2009  . Hyperlipidemia 04/30/2007  . OBESITY, NOS 01/01/2007  . HYPERTENSION, BENIGN SYSTEMIC 01/01/2007   Deneise Lever, [place] CF-SLP Speech-Language Pathologist  Aliene Altes 02/06/2017, 12:03 PM  [facility] [address] [place], [place], [address] Phone: [phone]   Fax:  [phone]   Name: Carla Hanson MRN: [id number] Date of Birth: August 09, 1966

## 2017-02-07 ENCOUNTER — Other Ambulatory Visit: Payer: Self-pay | Admitting: Oncology

## 2017-02-07 DIAGNOSIS — D509 Iron deficiency anemia, unspecified: Secondary | ICD-10-CM

## 2017-02-10 ENCOUNTER — Other Ambulatory Visit (INDEPENDENT_AMBULATORY_CARE_PROVIDER_SITE_OTHER): Payer: Self-pay

## 2017-02-10 ENCOUNTER — Ambulatory Visit: Payer: Self-pay | Admitting: Speech Pathology

## 2017-02-10 DIAGNOSIS — R41841 Cognitive communication deficit: Secondary | ICD-10-CM

## 2017-02-10 DIAGNOSIS — D509 Iron deficiency anemia, unspecified: Secondary | ICD-10-CM

## 2017-02-10 NOTE — Therapy (Signed)
[facility] [address] [place], [place], [address] Phone: [phone]   Fax:  [phone]  Speech Language Pathology Treatment  Patient Details  Name: Carla Hanson MRN: [id number] Date of Birth: 11-11-1965 Referring Provider: Garvin Fila, MD  Encounter Date: 02/10/2017      End of Session - 02/10/17 1337    Visit Number 9   Number of Visits 17   Date for SLP Re-Evaluation 02/21/17   Authorization Type self pay   SLP Start Time 1148   SLP Stop Time  1233   SLP Time Calculation (min) 45 min   Activity Tolerance Patient tolerated treatment well;Patient limited by fatigue      Past Medical History:  Diagnosis Date  . Anxiety   . Bipolar disorder ([place])   . Hyperlipidemia   . Hypertension   . Lipoma    Abdomen  . LIPOMA 01/20/2008  . Obesity   . Pneumonia   . Tobacco abuse     Past Surgical History:  Procedure Laterality Date  . CESAREAN SECTION    . CYSTECTOMY    . LIPOMA EXCISION  03/2011  . ORIF ANKLE FRACTURE  03/13/2012   Procedure: OPEN REDUCTION INTERNAL FIXATION (ORIF) ANKLE FRACTURE;  Surgeon: Jessy Oto, MD;  Location: [facility];  Service: Orthopedics;  Laterality: Right;    There were no vitals filed for this visit.      Subjective Assessment - 02/10/17 1152    Subjective "I still have a hard time focusing."   Currently in Pain? Yes   Pain Score 1    Pain Location Head   Pain Orientation Left;Anterior   Pain Descriptors / Indicators Aching   Pain Type Other (Comment)   Pain Onset Today   Pain Frequency Intermittent   Aggravating Factors  medication   Pain Relieving Factors tylenol   Multiple Pain Sites No               ADULT SLP TREATMENT - 02/10/17 1150      General Information   Behavior/Cognition Alert;Cooperative   Patient Positioning Upright in chair   Oral care provided N/A     Treatment Provided   Treatment provided Cognitive-Linquistic     Cognitive-Linquistic Treatment   Treatment focused on Cognition;Patient/family/caregiver education   Skilled Treatment Patient returns with approximately 30% of home tasks completed. She has not yet gotten a notebook or used a notepad provided by SLP to begin recording to do lists, daily activities as instructed. She requested to reschedule her final appointment (4/19), however only available appointments prior to recertification are with different therapists, which she declines. Provided education re: limited progress toward goals, importance of home assignments and strategies at home in order to achieve functional gains. Targeted focused, sustained attention during functional reading task. SLP provided mod cues to utilize strategies including environmental/visual modifications, verbalizing, association. Pt recalls basic information with 80% accuracy, specific details 60% accuracy. Assigned no additional homework this session and requested pt focus on sole task of beginning her daily list/schedule.     Assessment / Recommendations / Plan   Plan Continue with current plan of care     Progression Toward Goals   Progression toward goals Goals downgraded          SLP Education - 02/10/17 1338    Education provided Yes   Education Details role of home tasks in promoting carryover, functional gains   Person(s) Educated Patient   Methods Explanation   Comprehension  Verbalized understanding          SLP Short Term Goals - 02/10/17 1248      SLP SHORT TERM GOAL #1   Title Patient will verbalize 4 strategies to improve attention over 2 sessions   Baseline 4.2.18, 4.5.18   Period Weeks   Status Achieved     SLP SHORT TERM GOAL #2   Title Patient will demo use of compensatory aids and strategies to improve attention and executive function for 85% accuracy during functional tasks with mod A    Time 0   Period Weeks   Status Not Met     SLP SHORT TERM GOAL #3   Title Pt will complete  simple thought organization, problem solving and reasoning tasks with 90% accuracy given mod cues   Time 2   Period Weeks   Status On-going     SLP SHORT TERM GOAL #4   Title Patient will report use of compensatory aids and strategies for memory, attention and problem solving outside of therapy tasks for 25% perceived improvement in functioning at home   Time 2   Period Weeks   Status New          SLP Long Term Goals - 02/10/17 1249      SLP LONG TERM GOAL #1   Title Patient will report use of compensatory aids and strategies for memory, attention and problem solving outside of therapy tasks for 25% perceived improvement in functioning at home   Time 2   Period Weeks   Status Revised          Plan - 02/10/17 1327    Clinical Impression Statement Pt continues to display impairments in attention, memory, executive function and awareness. Patient acknowledges deficits, however her progress has been limited by low frustration tolerance and incomplete follow-through with home tasks designed to promote carryover of strategies trained in therapy. Spoke with pt at length today regarding progress, plan of care, and importance of home tasks in generalization of therapy tasks. Goals have been downgraded; discussed need for functional gains to warrant continued ST intervention to address her cognitive communication deficits.    Speech Therapy Frequency 2x / week   Duration Other (comment)   Treatment/Interventions Cueing hierarchy;SLP instruction and feedback;Functional tasks;Cognitive reorganization;Compensatory strategies;Patient/family education;Internal/external aids   Potential to Achieve Goals Fair   Potential Considerations Cooperation/participation level;Family/community support;Ability to learn/carryover information   SLP Home Exercise Plan begin using notebook to write information/to do lists   Consulted and Agree with Plan of Care Patient      Patient will benefit from skilled  therapeutic intervention in order to improve the following deficits and impairments:   Cognitive communication deficit    Problem List Patient Active Problem List   Diagnosis Date Noted  . Numbness and tingling 01/07/2017  . Iron deficiency anemia 12/12/2016  . Seizure ([place])   . ICH (intracerebral hemorrhage) ([place]) 11/08/2016  . CAP (community acquired pneumonia) 10/31/2016  . Dyspnea 10/31/2016  . Encounter for weight loss counseling 06/09/2016  . Tobacco abuse 05/10/2016  . Chronic obstructive pulmonary disease (COPD) ([place]) 05/10/2016  . History of alcohol dependence ([place]) 01/11/2016  . RUQ abdominal pain 12/29/2013  . Left leg DVT ([place]) 09/29/2013  . Pre-diabetes 03/02/2013  . History of pulmonary embolism 05/05/2012  . Painful menstrual periods 01/05/2012  . Bipolar disorder ([place]) 07/03/2010  . History of tobacco abuse 08/23/2009  . Hyperlipidemia 04/30/2007  . OBESITY, NOS 01/01/2007  . HYPERTENSION, BENIGN SYSTEMIC 01/01/2007  Deneise Lever, [place] CF-SLP Speech-Language Pathologist  Aliene Altes 02/10/2017, 1:51 PM  [facility] [address] [place], [place], [address] Phone: [phone]   Fax:  [phone]   Name: ELAINNA ESHLEMAN MRN: [id number] Date of Birth: Sep 04, 1966

## 2017-02-11 LAB — CBC WITH DIFFERENTIAL/PLATELET
BASOS ABS: 0.1 10*3/uL (ref 0.0–0.2)
Basos: 1 %
EOS (ABSOLUTE): 0.3 10*3/uL (ref 0.0–0.4)
Eos: 3 %
Hematocrit: 38.5 % (ref 34.0–46.6)
Hemoglobin: 12.2 g/dL (ref 11.1–15.9)
Immature Grans (Abs): 0 10*3/uL (ref 0.0–0.1)
Immature Granulocytes: 0 %
LYMPHS ABS: 2.6 10*3/uL (ref 0.7–3.1)
Lymphs: 30 %
MCH: 24.7 pg — ABNORMAL LOW (ref 26.6–33.0)
MCHC: 31.7 g/dL (ref 31.5–35.7)
MCV: 78 fL — ABNORMAL LOW (ref 79–97)
MONOCYTES: 5 %
MONOS ABS: 0.4 10*3/uL (ref 0.1–0.9)
Neutrophils Absolute: 5.4 10*3/uL (ref 1.4–7.0)
Neutrophils: 61 %
PLATELETS: 386 10*3/uL — AB (ref 150–379)
RBC: 4.93 x10E6/uL (ref 3.77–5.28)
RDW: 19.8 % — AB (ref 12.3–15.4)
WBC: 8.8 10*3/uL (ref 3.4–10.8)

## 2017-02-11 LAB — FERRITIN: FERRITIN: 24 ng/mL (ref 15–150)

## 2017-02-12 ENCOUNTER — Telehealth: Payer: Self-pay | Admitting: *Deleted

## 2017-02-12 NOTE — Telephone Encounter (Signed)
Pt called / informed "blood count improved - up from 10 to 12" per Dr Beryle Beams. Informed Ferritin up to 24. Stated thank you for calling.

## 2017-02-12 NOTE — Telephone Encounter (Signed)
-----   Message from Annia Belt, MD sent at 02/11/2017  1:56 PM EDT ----- call pt: blood count improved - up from 10 to 12

## 2017-02-13 ENCOUNTER — Encounter: Payer: Self-pay | Admitting: Speech Pathology

## 2017-02-13 ENCOUNTER — Telehealth: Payer: Self-pay | Admitting: *Deleted

## 2017-02-13 NOTE — Telephone Encounter (Signed)
-----   Message from Annia Belt, MD sent at 02/13/2017  2:28 PM EDT ----- She can skip visit - have her schedule a follow up in 1 year ----- Message ----- From: Ebbie Latus, RN Sent: 02/13/2017   1:46 PM To: Annia Belt, MD  Dr Darnell Level Pt called / stated she had received her lab results -   wants to know if she needs to keep her appt on Tuesday the 17th? Thanks

## 2017-02-13 NOTE — Telephone Encounter (Signed)
Pt called / stated she had received her lab results -   wants to know if she needs to keep her appt on Tuesday the 17th?

## 2017-02-13 NOTE — Telephone Encounter (Signed)
Called pt - informed she could skip her appt on Tuesday but schedule f/u in 1 year. Asked her to call back and talk w/Doris Elisabeth Cara to make the appt  stated she will. ;also I will send Tamela Oddi a message.

## 2017-02-14 ENCOUNTER — Telehealth: Payer: Self-pay | Admitting: Neurology

## 2017-02-14 NOTE — Telephone Encounter (Signed)
Pt is wanting to know if she should continue taking Bayer aspirin daily. Please call

## 2017-02-17 ENCOUNTER — Encounter: Payer: Self-pay | Admitting: Speech Pathology

## 2017-02-18 ENCOUNTER — Ambulatory Visit: Payer: Self-pay | Admitting: Oncology

## 2017-02-18 NOTE — Telephone Encounter (Signed)
I would advise her to call hematology. She was on Xarelto for recent DVTs and had an intraparenchymal hemorrhage. She needs to follow up with hematology to see how they want to treat her now for her DVT and if aspirin is enough. thanks

## 2017-02-18 NOTE — Telephone Encounter (Signed)
Returned pt TC w/ recommendations for her to contact hematology with advice on anticoagulant therapy due to her hx of ICH and DVT. May call back w/ any questions/concerns.

## 2017-02-18 NOTE — Telephone Encounter (Signed)
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

## 2017-02-19 ENCOUNTER — Telehealth: Payer: Self-pay | Admitting: Oncology

## 2017-02-19 NOTE — Telephone Encounter (Signed)
Pt needs to speak to nurse

## 2017-02-20 ENCOUNTER — Encounter: Payer: Self-pay | Admitting: Speech Pathology

## 2017-02-25 ENCOUNTER — Other Ambulatory Visit: Payer: Self-pay | Admitting: Internal Medicine

## 2017-02-25 ENCOUNTER — Encounter: Payer: Self-pay | Admitting: Oncology

## 2017-02-25 DIAGNOSIS — I10 Essential (primary) hypertension: Secondary | ICD-10-CM

## 2017-02-25 NOTE — Telephone Encounter (Signed)
Pt calls and states she does not remember what dr Beryle Beams advised about ASA therapy, whether to continue or to take something else, please advise

## 2017-02-25 NOTE — Telephone Encounter (Signed)
Pt  calling to request refill of:  Name of Medication(s):  Losartan-hydrochlorothiazide Last date of OV:  01-08-17 Pharmacy:  [facility]  Will route refill request to Clinic RN.  Discussed with patient policy to call pharmacy for future refills.  Also, discussed refills may take up to 48 hours to approve or deny.  Renella Cunas

## 2017-02-26 NOTE — Telephone Encounter (Signed)
This was addressed

## 2017-02-27 ENCOUNTER — Ambulatory Visit (INDEPENDENT_AMBULATORY_CARE_PROVIDER_SITE_OTHER): Payer: Self-pay | Admitting: Family Medicine

## 2017-02-27 VITALS — Ht 64.0 in | Wt 308.0 lb

## 2017-02-27 DIAGNOSIS — E785 Hyperlipidemia, unspecified: Secondary | ICD-10-CM

## 2017-02-27 DIAGNOSIS — E669 Obesity, unspecified: Secondary | ICD-10-CM

## 2017-02-27 DIAGNOSIS — IMO0001 Reserved for inherently not codable concepts without codable children: Secondary | ICD-10-CM

## 2017-02-27 DIAGNOSIS — Z6841 Body Mass Index (BMI) 40.0 and over, adult: Secondary | ICD-10-CM

## 2017-02-27 NOTE — Progress Notes (Signed)
[facility]  Assessment:  Primary concerns today: Weight management.  Goals for nutrition: Wants to loose weight, meal plans, make better choices, look at sodium intake  Learning Readiness:   Change in progress  Barriers to learning/adherence to lifestyle change: knowledge   Usual eating pattern includes 2 meals and 3 snacks per day. Frequent foods and beverages include bananas, water, diet green tea, spinach salad, tossed salads, baked chicken, [place] on whole wheat, cheese.  Avoided foods include no.   Usual physical activity includes try to get in 3 days of walking (15 minutes) but inconsistent  24-hr recall: (Up at 930 AM) B ( 10 AM)-   Banana, water Snk ( AM)-   L (2 PM)-        4 chicken drummets, 6 broccoli  Tots, Lipton lemon tea, water   Snk ( PM)-      banana   D (8 PM)-       Banana sandwich on whole wheat with light mayo, diet green tea, water  Snk ( PM)-   Typical day? Yes.     Sleep: 1am-10am  Progress Towards Goal(s):  1. Exercise for 30 minutes 3x/week.  2. Go bed no later than 1am through this week, 12:30am  through the week of May 6th, 12am through the week of May 13th, 11:30pm through the week May 20th, and by 11pm after that.  3.  Get a protein source, vegetable, and grain for lunch and dinner every day.     Handouts given during visit include:  AVS  Goals sheet  Demonstrated degree of understanding via:  Teach Back    I spent more than 50% of this 45 minute visit in direct counseling    Kathrine Cords, MD [place], PGY-3

## 2017-02-27 NOTE — Patient Instructions (Addendum)
Try to limit salt (sodium) consumption to 1,500mg  per day with an absolute max of 2,300mg .  Try mixing your juice with club soda or seltzer water Potatoes don't spike your blood sugar as much if you cook them and then cool them in the refrigerator. Eating something sour with them like lemons or plain yogurt will help that spike in blood sugar as well.   Goals to work towards:   1. Exercise for 30 minutes 3x/week.  2. Go bed no later than 1am through this week, 12:30am  through the week of May 6th, 12am through the week of May 13th, 11:30pm through the week May 20th, and by 11pm after that.  3.  Get a protein source, vegetable, and grain for lunch and dinner every day  Have the front desk schedule you with Dr. Jenne Campus on Thursday June 14th from 11:12pm.     [place] stands for "Dietary Approaches to Stop Hypertension." The DASH eating plan is a healthy eating plan that has been shown to reduce high blood pressure (hypertension). It may also reduce your risk for type 2 diabetes, heart disease, and stroke. The DASH eating plan may also help with weight loss. What are tips for following this plan? General guidelines   Avoid eating more than 2,300 mg (milligrams) of salt (sodium) a day. If you have hypertension, you may need to reduce your sodium intake to 1,500 mg a day.  Limit alcohol intake to no more than 1 drink a day for nonpregnant women and 2 drinks a day for men. One drink equals 12 oz of beer, 5 oz of wine, or 1 oz of hard liquor.  Work with your health care provider to maintain a healthy body weight or to lose weight. Ask what an ideal weight is for you.  Get at least 30 minutes of exercise that causes your heart to beat faster (aerobic exercise) most days of the week. Activities may include walking, swimming, or biking.  Work with your health care provider or diet and nutrition specialist (dietitian) to adjust your eating plan to your individual calorie needs. Reading  food labels   Check food labels for the amount of sodium per serving. Choose foods with less than 5 percent of the Daily Value of sodium. Generally, foods with less than 300 mg of sodium per serving fit into this eating plan.  To find whole grains, look for the word "whole" as the first word in the ingredient list. Shopping   Buy products labeled as "low-sodium" or "no salt added."  Buy fresh foods. Avoid canned foods and premade or frozen meals. Cooking   Avoid adding salt when cooking. Use salt-free seasonings or herbs instead of table salt or sea salt. Check with your health care provider or pharmacist before using salt substitutes.  Do not fry foods. Cook foods using healthy methods such as baking, boiling, grilling, and broiling instead.  Cook with heart-healthy oils, such as olive, canola, soybean, or sunflower oil. Meal planning    Eat a balanced diet that includes:  5 or more servings of fruits and vegetables each day. At each meal, try to fill half of your plate with fruits and vegetables.  Up to 6-8 servings of whole grains each day.  Less than 6 oz of lean meat, poultry, or fish each day. A 3-oz serving of meat is about the same size as a deck of cards. One egg equals 1 oz.  2 servings of low-fat dairy each day.  A serving of nuts, seeds, or beans 5 times each week.  Heart-healthy fats. Healthy fats called Omega-3 fatty acids are found in foods such as flaxseeds and coldwater fish, like sardines, salmon, and mackerel.  Limit how much you eat of the following:  Canned or prepackaged foods.  Food that is high in trans fat, such as fried foods.  Food that is high in saturated fat, such as fatty meat.  Sweets, desserts, sugary drinks, and other foods with added sugar.  Full-fat dairy products.  Do not salt foods before eating.  Try to eat at least 2 vegetarian meals each week.  Eat more home-cooked food and less restaurant, buffet, and fast food.  When  eating at a restaurant, ask that your food be prepared with less salt or no salt, if possible. What foods are recommended? The items listed may not be a complete list. Talk with your dietitian about what dietary choices are best for you. Grains  Whole-grain or whole-wheat bread. Whole-grain or whole-wheat pasta. Brown rice. [place]. Bulgur. Whole-grain and low-sodium cereals. Pita bread. Low-fat, low-sodium crackers. Whole-wheat flour tortillas. Vegetables  Fresh or frozen vegetables (raw, steamed, roasted, or grilled). Low-sodium or reduced-sodium tomato and vegetable juice. Low-sodium or reduced-sodium tomato sauce and tomato paste. Low-sodium or reduced-sodium canned vegetables. Fruits  All fresh, dried, or frozen fruit. Canned fruit in natural juice (without added sugar). Meat and other protein foods  Skinless chicken or [place]. Ground chicken or [place]. Pork with fat trimmed off. Fish and seafood. Egg whites. Dried beans, peas, or lentils. Unsalted nuts, nut butters, and seeds. Unsalted canned beans. Lean cuts of beef with fat trimmed off. Low-sodium, lean deli meat. Dairy  Low-fat (1%) or fat-free (skim) milk. Fat-free, low-fat, or reduced-fat cheeses. Nonfat, low-sodium ricotta or cottage cheese. Low-fat or nonfat yogurt. Low-fat, low-sodium cheese. Fats and oils  Soft margarine without trans fats. Vegetable oil. Low-fat, reduced-fat, or light mayonnaise and salad dressings (reduced-sodium). Canola, safflower, olive, soybean, and sunflower oils. Avocado. Seasoning and other foods  Herbs. Spices. Seasoning mixes without salt. Unsalted popcorn and pretzels. Fat-free sweets. What foods are not recommended? The items listed may not be a complete list. Talk with your dietitian about what dietary choices are best for you. Grains  Baked goods made with fat, such as croissants, muffins, or some breads. Dry pasta or rice meal packs. Vegetables  Creamed or fried vegetables. Vegetables in a  cheese sauce. Regular canned vegetables (not low-sodium or reduced-sodium). Regular canned tomato sauce and paste (not low-sodium or reduced-sodium). Regular tomato and vegetable juice (not low-sodium or reduced-sodium). Angie Fava. Olives. Fruits  Canned fruit in a light or heavy syrup. Fried fruit. Fruit in cream or butter sauce. Meat and other protein foods  Fatty cuts of meat. Ribs. Fried meat. Berniece Salines. Sausage. Bologna and other processed lunch meats. Salami. Fatback. Hotdogs. Bratwurst. Salted nuts and seeds. Canned beans with added salt. Canned or smoked fish. Whole eggs or egg yolks. Chicken or [place] with skin. Dairy  Whole or 2% milk, cream, and half-and-half. Whole or full-fat cream cheese. Whole-fat or sweetened yogurt. Full-fat cheese. Nondairy creamers. Whipped toppings. Processed cheese and cheese spreads. Fats and oils  Butter. Stick margarine. Lard. Shortening. Ghee. Bacon fat. Tropical oils, such as coconut, palm kernel, or palm oil. Seasoning and other foods  Salted popcorn and pretzels. Onion salt, garlic salt, seasoned salt, table salt, and sea salt. Worcestershire sauce. Tartar sauce. Barbecue sauce. Teriyaki sauce. Soy sauce, including reduced-sodium. Steak sauce. Canned and packaged gravies.  Fish sauce. Oyster sauce. Cocktail sauce. Horseradish that you find on the shelf. Ketchup. Mustard. Meat flavorings and tenderizers. Bouillon cubes. Hot sauce and Tabasco sauce. Premade or packaged marinades. Premade or packaged taco seasonings. Relishes. Regular salad dressings. Where to find more information:  National Heart, Lung, and [place]: https://wilson-eaton.com/  American Heart Association: www.heart.org Summary  The DASH eating plan is a healthy eating plan that has been shown to reduce high blood pressure (hypertension). It may also reduce your risk for type 2 diabetes, heart disease, and stroke.  With the DASH eating plan, you should limit salt (sodium) intake to 2,300 mg a  day. If you have hypertension, you may need to reduce your sodium intake to 1,500 mg a day.  When on the DASH eating plan, aim to eat more fresh fruits and vegetables, whole grains, lean proteins, low-fat dairy, and heart-healthy fats.  Work with your health care provider or diet and nutrition specialist (dietitian) to adjust your eating plan to your individual calorie needs. This information is not intended to replace advice given to you by your health care provider. Make sure you discuss any questions you have with your health care provider. Document Released: 10/10/2011 Document Revised: 10/14/2016 Document Reviewed: 10/14/2016 Elsevier Interactive Patient Education  2017 [employer].

## 2017-02-28 ENCOUNTER — Other Ambulatory Visit: Payer: Self-pay | Admitting: *Deleted

## 2017-02-28 DIAGNOSIS — R062 Wheezing: Secondary | ICD-10-CM

## 2017-02-28 MED ORDER — ALBUTEROL SULFATE HFA 108 (90 BASE) MCG/ACT IN AERS
1.0000 | INHALATION_SPRAY | Freq: Four times a day (QID) | RESPIRATORY_TRACT | 1 refills | Status: DC | PRN
Start: 2017-02-28 — End: 2017-04-01

## 2017-03-03 MED ORDER — LOSARTAN POTASSIUM-HCTZ 50-12.5 MG PO TABS: 1.0000 | ORAL_TABLET | Freq: Every day | ORAL | 3 refills | Status: DC

## 2017-03-03 NOTE — Telephone Encounter (Signed)
Relayed message from Dr. Beryle Beams to patient. She expressed understanding. Tried to call in Rx for hyzaar but [facility] closed. Asked Red Team to call on my behalf during business hours.  Barnet Pall, MD [place], PGY-2

## 2017-03-03 NOTE — Telephone Encounter (Signed)
She should stay on low dose aspirin 81 mg. 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 She decided to skip her follow up appointment. I will see her again in 1 year or as needed. Sorry for delay in reply. DrG

## 2017-03-04 NOTE — Telephone Encounter (Signed)
Rx called into [facility] pharmacy.

## 2017-03-05 ENCOUNTER — Ambulatory Visit: Payer: Self-pay | Admitting: Psychology

## 2017-03-06 ENCOUNTER — Ambulatory Visit: Payer: Self-pay | Attending: Neurology | Admitting: Speech Pathology

## 2017-03-06 DIAGNOSIS — R41841 Cognitive communication deficit: Secondary | ICD-10-CM | POA: Insufficient documentation

## 2017-03-06 NOTE — Patient Instructions (Signed)
  Cognitive Activities you can do at home:   - Solitaire  - Majong  - Scrabble  - Chess/Checkers  - Crosswords (easy level)  - Uno  - Card Games  - Board Games  - Connect 4  - Simon  - the Memory Game  On your computer, tablet or phone: BrainHQ Brainbashers.com Neuronation App Memory Match Game App Rush Hour Chocolate Fix Sort it out Scrabble pics App Cracker Barrel App Photo Quiz App MixTwo App What's the Word?App  

## 2017-03-06 NOTE — Therapy (Signed)
[facility] [address] [place], [place], [address] Phone: [phone]   Fax:  [phone]  Speech Language Pathology Renewal and Discharge Summary  Patient Details  Name: Carla Hanson MRN: [id number] Date of Birth: 09-Feb-1966 Referring Provider: Garvin Fila, MD  Encounter Date: 03/06/2017      End of Session - 03/06/17 1209    Visit Number 10   Number of Visits 17   Date for SLP Re-Evaluation 03/07/17   Authorization Type self pay   SLP Start Time 1105   SLP Stop Time  1134   SLP Time Calculation (min) 29 min   Activity Tolerance Patient tolerated treatment well      Past Medical History:  Diagnosis Date  . Anxiety   . Bipolar disorder ([place])   . Hyperlipidemia   . Hypertension   . Lipoma    Abdomen  . LIPOMA 01/20/2008  . Obesity   . Pneumonia   . Tobacco abuse     Past Surgical History:  Procedure Laterality Date  . CESAREAN SECTION    . CYSTECTOMY    . LIPOMA EXCISION  03/2011  . ORIF ANKLE FRACTURE  03/13/2012   Procedure: OPEN REDUCTION INTERNAL FIXATION (ORIF) ANKLE FRACTURE;  Surgeon: Jessy Oto, MD;  Location: [facility];  Service: Orthopedics;  Laterality: Right;    There were no vitals filed for this visit.      Subjective Assessment - 03/06/17 1108    Subjective "I've been writing things down... that actually helps a lot."   Currently in Pain? Yes   Pain Score 1    Pain Location Head   Pain Descriptors / Indicators Aching   Pain Type Acute pain   Pain Onset Today   Pain Frequency Intermittent   Aggravating Factors  medication, tension   Pain Relieving Factors tylenol, walking, fresh air   Multiple Pain Sites No               ADULT SLP TREATMENT - 03/06/17 1110      General Information   Behavior/Cognition Alert;Cooperative   Patient Positioning Upright in chair   Oral care provided N/A     Treatment Provided   Treatment provided Cognitive-Linquistic     Cognitive-Linquistic Treatment   Treatment focused on Cognition;Patient/family/caregiver education   Skilled Treatment Pt states she cancelled therapy sessions during previous 3 weeks due to her grandfather's illness. She has begun keeping daily list/schedule and using a calendar. SLP facilitated pt recall of strategies to improve memory, attention. At this time she agrees she has made at least 25% improvement in function with these strategies. Educated pt in maintenance home program for cognition.     Assessment / Recommendations / Plan   Plan Discharge SLP treatment due to (comment)  long term goal met     Progression Toward Goals   Progression toward goals Goals met, education completed, patient discharged from [facility] - 03/06/17 [place] #1   Baseline 4.2.18, 4.5.18   Time 1   Period Weeks   Status Achieved     SLP SHORT TERM GOAL #2   Title Patient will demo use of compensatory aids and strategies to improve attention and executive function for 85% accuracy during functional tasks with mod A    Time 0   Period Weeks   Status Not Met  SLP SHORT TERM GOAL #3   Title Pt will complete simple thought organization, problem solving and reasoning tasks with 90% accuracy given mod cues   Time 0   Period Weeks   Status Not Met     SLP SHORT TERM GOAL #4   Title Patient will report use of compensatory aids and strategies for memory, attention and problem solving outside of therapy tasks for 25% perceived improvement in functioning at home   Time 0   Period Weeks   Status Achieved          SLP Long Term Goals - 03/06/17 1151      SLP LONG TERM GOAL #1   Title Patient will report use of compensatory aids and strategies for memory, attention and problem solving outside of therapy tasks for 25% perceived improvement in functioning at home   Time 0   Period Weeks   Status Achieved          Plan - 03/06/17 1152     Clinical Impression Statement Pt returned for first time 3 weeks; she states her grandfather passed away and she had to miss therapy because he was ill. During previous session, goals were downgraded and spoke with pt at length re: use of strategies at home, follow-through with home tasks. Patient returns today stating she has been writing daily in a notebook and using a calendar to keep track of activities, and verbalizes strategies she is using to improve her attention during tasks such as cooking (with supervision). She feels she has made at least a 25% improvement in cognitive function with use of these aids and strategies, meeting her long-term goal. At this time, no further skilled ST intervention recommended, and pt is in agreement with discharge. Provided pt with written and verbal education re: cognitive home program and activities. Certification period ended 1/96/22, recertification completed for today's visit only, during which pt was trained and educated in ongoing use of aids and strategies for long term goal, met today. Pt discharged from therapy.   Speech Therapy Frequency Other (comment)  discharge from therapy   Duration --  discharge from therapy   Treatment/Interventions Cueing hierarchy;SLP instruction and feedback;Functional tasks;Cognitive reorganization;Compensatory strategies;Patient/family education;Internal/external aids   Potential to Achieve Goals Fair   Potential Considerations Cooperation/participation level;Family/community support;Ability to learn/carryover information   SLP Home Exercise Plan cognitive home program   Consulted and Agree with Plan of Care Patient      Patient will benefit from skilled therapeutic intervention in order to improve the following deficits and impairments:   Cognitive communication deficit    Problem List Patient Active Problem List   Diagnosis Date Noted  . Numbness and tingling 01/07/2017  . Iron deficiency anemia 12/12/2016  .  Seizure ([place])   . ICH (intracerebral hemorrhage) ([place]) 11/08/2016  . CAP (community acquired pneumonia) 10/31/2016  . Dyspnea 10/31/2016  . Encounter for weight loss counseling 06/09/2016  . Tobacco abuse 05/10/2016  . Chronic obstructive pulmonary disease (COPD) ([place]) 05/10/2016  . History of alcohol dependence ([place]) 01/11/2016  . RUQ abdominal pain 12/29/2013  . Left leg DVT ([place]) 09/29/2013  . Pre-diabetes 03/02/2013  . History of pulmonary embolism 05/05/2012  . Painful menstrual periods 01/05/2012  . Bipolar disorder ([place]) 07/03/2010  . History of tobacco abuse 08/23/2009  . Hyperlipidemia 04/30/2007  . OBESITY, NOS 01/01/2007  . HYPERTENSION, BENIGN SYSTEMIC 01/01/2007    SPEECH THERAPY DISCHARGE SUMMARY  Visits from Start of Care: 10  Current functional level related to goals /  functional outcomes: Patient reports she has been attempting to use strategies including writing down information, keeping a to-do list and calendar, avoiding multitasking, completing one task before beginning another and feels she has improved in functioning at home. For more involved cooking tasks, pt remains modified independent, states she doesn't "feel comfortable" cooking without another adult present.    Remaining deficits: Pt continues to display impairments in attention, memory, executive function and awareness. Patient acknowledges deficits, however her progress in therapy was limited by low frustration tolerance and incomplete follow-through with home tasks designed to promote carryover of strategies trained in therapy.    Education / Equipment: Continue use of calendars and to do lists, handout for cognitive home program provided. Plan: Patient agrees to discharge.  Patient goals were met. Patient is being discharged due to meeting the stated rehab goals.  ?????        Aliene Altes 03/06/2017, 1:10 PM   [facility], [place], [place] [address] [place], [place], [address] Phone: [phone]   Fax:  [phone]   Name: KWANA RINGEL MRN: [id number] Date of Birth: 08/28/1966

## 2017-03-11 ENCOUNTER — Encounter: Payer: Self-pay | Admitting: Internal Medicine

## 2017-03-11 ENCOUNTER — Ambulatory Visit (INDEPENDENT_AMBULATORY_CARE_PROVIDER_SITE_OTHER): Payer: Self-pay | Admitting: Internal Medicine

## 2017-03-11 VITALS — BP 126/76 | HR 77 | Temp 98.6°F | Ht 64.0 in | Wt 306.4 lb

## 2017-03-11 DIAGNOSIS — E6609 Other obesity due to excess calories: Secondary | ICD-10-CM

## 2017-03-11 NOTE — Progress Notes (Signed)
[facility] Progress Note  Subjective:  Carla Hanson is a 51 y.o. female with history of bipolar disorder, HTN, hemorrhagic stroke, DVT/PE and obesity who presents to follow-up weight loss.   #Weight Loss: - Is very motivated to make lifestyle changes in setting of recent stroke, with desire to avoid being on lots of medication (had stroke on xarelto so now wary to start other suggested medications like metformin or statin) - Saw dietician Dr. Jenne Campus with Dr. Lorenso Courier last month and made goals of exercising 30 minutes 3x/week; making bedtime earlier; and trying to balance her meals with protein, vegetables and grain at each meal.  - Currently is doing well with 2 of those goals. She walks around her neighborhood every morning, often with company (mom or a girlfriend) and now is up to a mile a day; reports dietary changes of having spinach salads; proteins of chicken and salmon and more [place] instead of pork; making replacement of syrup on waffle to peanut butter; still eating multiple bananas a day but will also eat fruits like apples - Has just started weight training and also interested in starting yoga to improve flexibility - Looking forward to meeting with Dr. Jenne Campus again in June and feeling proud of her progress so far - Continues to stay up late watching TV programs and does not wake up until about 10 a.m.  ROS: No weakness, no chest pain  Social: Former smoker  Objective: Blood pressure 126/76, pulse 77, temperature 98.6 F (37 C), temperature source Oral, height 5\' 4"  (1.626 m), weight (!) 306 lb 6.4 oz (139 kg), SpO2 95 %. Constitutional: Very pleasant, well appearing female in NAD Cardiovascular: RRR, S1, S2, no m/r/g.  Pulmonary/Chest: Effort normal and breath sounds normal. No respiratory distress.  Musculoskeletal: FROM Neurological: AOx3, no focal deficits (though patient still aware of some slurred/stuttering speech at times) Psychiatric: Normal mood and  affect.  Vitals reviewed  Assessment/Plan: OBESITY, NOS - Has lost about 7 lbs since March - Congratulated patient on her progress - Suggested using a stepcounter app or trying a fitbit to reach goal of 5,000 steps then increase to 10,000 steps per day - Current goal is to lose 30 lbs and continue from there - Would like to see patient back in June to recheck hgb A1c (5.7 in March); will hopefully see improvement with her lifestyle changes - Suggested taping/recording her late night shows or getting dvds to help sleep schedule   Future Appointments Date Time Provider [place]  04/17/2017 11:00 AM Kennith Center, RD [facility]  04/17/2017 1:30 PM Rogue Bussing, MD [facility] [place], MD [place], PGY-2

## 2017-03-11 NOTE — Patient Instructions (Signed)
Ms. Schneck,  Thank you for coming in today.  Congratulations on your weight loss.  Keep up the good work. Consider trying a step counter app or using a device like a fitbit to use step counts as a daily goal. 5000 steps would be a great first goal, eventually moving up to 10,000 steps.   Please see me back in June, and we'll recheck your blood sugar.  Best, Dr. Ola Spurr

## 2017-03-13 NOTE — Assessment & Plan Note (Signed)
-   Has lost about 7 lbs since March - Congratulated patient on her progress - Suggested using a stepcounter app or trying a fitbit to reach goal of 5,000 steps then increase to 10,000 steps per day - Current goal is to lose 30 lbs and continue from there - Would like to see patient back in June to recheck hgb A1c (5.7 in March); will hopefully see improvement with her lifestyle changes - Suggested taping/recording her late night shows or getting dvds to help sleep schedule

## 2017-03-19 ENCOUNTER — Telehealth: Payer: Self-pay | Admitting: *Deleted

## 2017-03-24 ENCOUNTER — Ambulatory Visit: Payer: Self-pay | Admitting: Internal Medicine

## 2017-04-01 ENCOUNTER — Other Ambulatory Visit: Payer: Self-pay | Admitting: *Deleted

## 2017-04-01 ENCOUNTER — Ambulatory Visit: Payer: Self-pay

## 2017-04-01 DIAGNOSIS — R062 Wheezing: Secondary | ICD-10-CM

## 2017-04-03 MED ORDER — ALBUTEROL SULFATE HFA 108 (90 BASE) MCG/ACT IN AERS: 1.0000 | INHALATION_SPRAY | Freq: Four times a day (QID) | RESPIRATORY_TRACT | 1 refills | Status: DC | PRN

## 2017-04-17 ENCOUNTER — Ambulatory Visit: Payer: Self-pay | Admitting: Internal Medicine

## 2017-04-17 ENCOUNTER — Ambulatory Visit (INDEPENDENT_AMBULATORY_CARE_PROVIDER_SITE_OTHER): Payer: Self-pay | Admitting: Family Medicine

## 2017-04-17 VITALS — BP 126/53 | Ht 64.0 in | Wt 299.6 lb

## 2017-04-17 DIAGNOSIS — E6609 Other obesity due to excess calories: Secondary | ICD-10-CM

## 2017-04-17 DIAGNOSIS — R7303 Prediabetes: Secondary | ICD-10-CM

## 2017-04-17 LAB — POCT GLYCOSYLATED HEMOGLOBIN (HGB A1C): Hemoglobin A1C: 5.7

## 2017-04-17 NOTE — Addendum Note (Signed)
Addended by: [place] Pink on: 04/17/2017 12:02 PM   Modules accepted: Orders

## 2017-04-17 NOTE — Progress Notes (Signed)
[facility]  Assessment:  Primary concerns today: Weight management.  Goals for nutrition: Wants to loose weight, meal plans, make better choices, look at sodium intake  Learning Readiness:   Change in progress  Barriers to learning/adherence to lifestyle change: knowledge   Had an off week this week due to sickness. Feeling well today. Trying to incorporate more fruits and vegetables.  Prior to this exercising 4 times weekly  Getting to bed earlier and sleeping more. Feeling good and less tired.  24-hr recall:  (Up at 9 AM) B (10 AM)-   Fruit bowl - 1c strawberries and 1 banana, 6oz low-fat peach yogurt  L (1 PM)-  Salad- iceberg lettuce mix, 2oz cheese, 1-2oz deli [place], cranberries, red onion, 1/4 c croutons, 3tbsp dressing (french), diet green tea (16oz)   D (5 PM)-  Salad- iceberg lettuce mix, 2oz cheese, 1-2oz deli [place], cranberries, red onion, 1/4 c croutons, 3tbsp dressing (french), 6oz low-fat yogurt, diet green tea (16oz), 1 banana Typical day? Yes.     Progress Towards Goal(s):   Previous goals: 1. Exercise for 30 minutes 3x/week. - yes at least 4x/wk - has gotten up to a 2 mile walk 2. Go bed no later than 1am through this week, 12:30am  through the week of May 6th, 12am through the week of May 13th, 11:30pm through the week May 20th, and by 11pm after that.  - currently getting to bed around 10:30-11:30pm 3.  Get a protein source, vegetable, and grain for lunch and dinner every day.    Goal(s):  1. Walk for 2 miles 5x/wk 2. Go bed no later 11:30pm 3. Geta protein source with breakfast daily.   Handouts given during visit include:  AVS  Goals sheet  Demonstrated degree of understanding via:  Teach Back    I spent more than 50% of this 45 minute visit in direct counseling    Bacigalupo, Dionne Bucy, MD, MPH PGY-3,  [facility] 04/17/2017 11:48 AM

## 2017-04-17 NOTE — Patient Instructions (Signed)
Goal(s):  1. Walk for 2 miles 5x/wk 2. Go bed no later 11:30pm 3. Geta protein source with breakfast daily.   Next appt (schedule at the front desk): 7/23 at 11:30am (60 min appt)

## 2017-04-28 ENCOUNTER — Encounter: Payer: Self-pay | Admitting: Internal Medicine

## 2017-04-28 ENCOUNTER — Ambulatory Visit (INDEPENDENT_AMBULATORY_CARE_PROVIDER_SITE_OTHER): Payer: Self-pay | Admitting: Internal Medicine

## 2017-04-28 VITALS — BP 128/72 | HR 66 | Temp 98.2°F | Ht 64.0 in | Wt 295.2 lb

## 2017-04-28 DIAGNOSIS — M79602 Pain in left arm: Secondary | ICD-10-CM

## 2017-04-28 DIAGNOSIS — M79601 Pain in right arm: Secondary | ICD-10-CM

## 2017-04-28 NOTE — Progress Notes (Signed)
[facility] Progress Note  Subjective:  Carla Hanson is a 51 y.o. female with history of obesity, HTN, CVA, DVT/PE, and bipolar disorder who presents for concern of bilateral arm pain.   #Arm pain - Says left arm feels "more like carpal tunnel" whereas R arm feels more like a soreness - Symptoms occurring for about 1 week - Has noticed that keeping her L arm straight and using ice helps some - She stopped lifting weights when pain started - Sensation like her L hand and forearm "falls asleep" lasts only a few seconds and is associated with sleeping on her arm "funny" - Thinks R arm pain is associated with sensation of tightness in R upper back - Has tried tylenol with little relief - Is concerned R arm pain could indicate a blood clot ROS: No shortness of breath, no chest pain  Objective: Height 5\' 4"  (1.626 m), weight 295 lb 3.2 oz (133.9 kg), last menstrual period 03/11/2017. Body mass index is 50.67 kg/m. Constitutional: Very pleasant, obese female in NAD Cardiovascular: RRR, S1, S2, no m/r/g.  Pulmonary/Chest: Effort normal and breath sounds normal. No respiratory distress.  Musculoskeletal: Negative Apley Scratch test on R. No pain to palpation over shoulder girdle. Increased tension over R upper trapezius compared to left. No midline spinal TTP. Strength 5/5 with internal and external rotation of shoulder bilaterally. UE strength, including hand grip, 5/5.  Neurological: AOx3, no focal deficits. Sensation to light touch and sharp touch intact and symmetric bilaterally over upper extremities. Negative tinel sign over L wrist and elbow but positive Phalen's test on L.  Skin: Skin is warm and dry. No rash noted. No erythema.  Psychiatric: Somewhat anxious affect  Vitals reviewed  Assessment/Plan: Pain in both upper extremities - Patient with recent stroke and concerned about implications of pain. Given the L arm symptoms are brought on by sleeping with flexed  elbow and wrist, suspect early cubital/carpal tunnel syndrome. Right arm pain is not reproducible and may have component of muscle spasm with increased tension over R upper back on exam. - Recommended trying to keep L arm straight by wearing elbow brace at night. Continue to apply ice to forearm when sore. - Recommended tylenol and heat therapy for right back/arm pain, as well as decreasing amount of weight she lifts vs taking a break. Recommended light stretching exercises to maintain mobility of shoulder.  - Return in about 1 month if symptoms not improving.   Follow-up in about 3 months for recheck A1c and lipid panel.  Olene Floss, MD [place], PGY-2

## 2017-04-28 NOTE — Patient Instructions (Addendum)
Ms. Kahler,  It's nice to see you today.  For your left arm pain, this does sound like carpal and cubital tunnel syndrome from sleeping funny. I recommend trying an elbow brace to keep your arm in a straight position while sleeping. Applying ice when your forearm is sore is also helpful.   For right arm and back pain, try heat. Continue tylenol as needed. Light stretching to keep the shoulder joint mobile is good to do, as well. Reduce heaviness of weights or take breaks as needed.  If symptoms aren't better in about 1 month, come back and see me. Otherwise, I will plan to see you in the early fall for diabetes and cholesterol check.  Best, Dr. Ola Spurr   Carpal Tunnel Syndrome Carpal tunnel syndrome is a condition that causes pain in your hand and arm. The carpal tunnel is a narrow area that is on the palm side of your wrist. Repeated wrist motion or certain diseases may cause swelling in the tunnel. This swelling can pinch the main nerve in the wrist (median nerve). Follow these instructions at home: If you have a splint:  Wear it as told by your doctor. Remove it only as told by your doctor.  Loosen the splint if your fingers: ? Become numb and tingle. ? Turn blue and cold.  Keep the splint clean and dry. General instructions  Take over-the-counter and prescription medicines only as told by your doctor.  Rest your wrist from any activity that may be causing your pain. If needed, talk to your employer about changes that can be made in your work, such as getting a wrist pad to use while typing.  If directed, apply ice to the painful area: ? Put ice in a plastic bag. ? Place a towel between your skin and the bag. ? Leave the ice on for 20 minutes, 2-3 times per day.  Keep all follow-up visits as told by your doctor. This is important.  Do any exercises as told by your doctor, physical therapist, or occupational therapist. Contact a doctor if:  You have new  symptoms.  Medicine does not help your pain.  Your symptoms get worse. This information is not intended to replace advice given to you by your health care provider. Make sure you discuss any questions you have with your health care provider. Document Released: 10/10/2011 Document Revised: 03/28/2016 Document Reviewed: 03/08/2015 Elsevier Interactive Patient Education  [employer].

## 2017-04-30 DIAGNOSIS — M79601 Pain in right arm: Secondary | ICD-10-CM | POA: Insufficient documentation

## 2017-04-30 DIAGNOSIS — M79602 Pain in left arm: Principal | ICD-10-CM

## 2017-04-30 NOTE — Assessment & Plan Note (Addendum)
-   Patient with recent stroke and concerned about implications of pain. Given the L arm symptoms are brought on by sleeping with flexed elbow and wrist, suspect early cubital/carpal tunnel syndrome. Right arm pain is not reproducible and may have component of muscle spasm with increased tension over R upper back on exam. - Recommended trying to keep L arm straight by wearing elbow brace at night. Continue to apply ice to forearm when sore. - Recommended tylenol and heat therapy for right back/arm pain, as well as decreasing amount of weight she lifts vs taking a break. Recommended light stretching exercises to maintain mobility of shoulder.  - Return in about 1 month if symptoms not improving.

## 2017-05-02 ENCOUNTER — Telehealth: Payer: Self-pay | Admitting: Internal Medicine

## 2017-05-02 MED ORDER — CYCLOBENZAPRINE HCL 5 MG PO TABS: 5.0000 mg | ORAL_TABLET | Freq: Three times a day (TID) | ORAL | 0 refills | Status: DC | PRN

## 2017-05-02 NOTE — Telephone Encounter (Signed)
Pt calling to request refill of:  Name of Medication(s):  Muscle relaxers, pt stated PCP would call them after last appointment  Last date of OV: 04-28-17 Pharmacy:  [facility]   Will route refill request to Clinic RN.  Discussed with patient policy to call pharmacy for future refills.  Also, discussed refills may take up to 48 hours to approve or deny.  Renella Cunas

## 2017-05-02 NOTE — Telephone Encounter (Signed)
Placed order for prn flexeril.

## 2017-05-12 ENCOUNTER — Other Ambulatory Visit: Payer: Self-pay | Admitting: *Deleted

## 2017-05-12 DIAGNOSIS — R062 Wheezing: Secondary | ICD-10-CM

## 2017-05-13 MED ORDER — ALBUTEROL SULFATE HFA 108 (90 BASE) MCG/ACT IN AERS: 1.0000 | INHALATION_SPRAY | Freq: Four times a day (QID) | RESPIRATORY_TRACT | 5 refills | Status: DC | PRN

## 2017-05-15 ENCOUNTER — Encounter: Payer: Self-pay | Admitting: Internal Medicine

## 2017-05-19 ENCOUNTER — Telehealth: Payer: Self-pay

## 2017-05-19 ENCOUNTER — Other Ambulatory Visit: Payer: Self-pay | Admitting: Neurology

## 2017-05-19 DIAGNOSIS — I619 Nontraumatic intracerebral hemorrhage, unspecified: Secondary | ICD-10-CM

## 2017-05-19 DIAGNOSIS — R569 Unspecified convulsions: Secondary | ICD-10-CM

## 2017-05-19 NOTE — Telephone Encounter (Signed)
Called pt who says that she is willing to have both the CT repeated and an EEG if recommended by Dr. Jaynee Eagles.

## 2017-05-19 NOTE — Telephone Encounter (Signed)
Yes, I will order it thanks!

## 2017-05-19 NOTE — Telephone Encounter (Signed)
-----   Message from Melvenia Beam, MD sent at 05/19/2017  1:48 PM EDT ----- Would you call patient please? We discussed repeating CT of the head and an eeg around this time and then having her follow up in the office with me. Let me know if she is still willing thanks  ----- Message ----- From: Melvenia Beam, MD Sent: 05/14/2017 To: Melvenia Beam, MD  Repeat CT head and eeg before appt

## 2017-05-22 ENCOUNTER — Telehealth: Payer: Self-pay | Admitting: Internal Medicine

## 2017-05-22 ENCOUNTER — Telehealth: Payer: Self-pay | Admitting: *Deleted

## 2017-05-22 NOTE — Telephone Encounter (Signed)
Pt would like PCP to call her today regarding menstrual issues. Pt is taking Asprin, but wants to know if it is ok to stop for a couple days due to bleeding. ep

## 2017-05-22 NOTE — Telephone Encounter (Signed)
Called patient. She says she is having a normal flow of her menstrual cycle but had some blood run down her leg earlier today. Not feeling dizzy and denies further episodes. Advised her to continue daily aspirin for cardiovascular risk prevention.

## 2017-05-22 NOTE — Telephone Encounter (Signed)
Call from pt - stated did not have menstrual cycle last month but on Saturday when she stood up to do something, suddenly there was a gush of blood. When she went to the bathroom, she passed a clot. No more heavy bleeding since that one time ; light bleeding "normal" today. States she feels fine - no h/a's, dizziness. She take ASA daily. Wants to know if she should stop taking ASA for 1 or 2 days? Thanks

## 2017-05-22 NOTE — Telephone Encounter (Signed)
Will forward to PCP for advise. Derl Barrow, RN

## 2017-05-23 ENCOUNTER — Ambulatory Visit
Admission: RE | Admit: 2017-05-23 | Discharge: 2017-05-23 | Disposition: A | Discharge status: Home / Self Care | Payer: No Typology Code available for payment source | Source: Ambulatory Visit | Attending: Neurology | Admitting: Neurology

## 2017-05-23 DIAGNOSIS — R569 Unspecified convulsions: Secondary | ICD-10-CM

## 2017-05-23 DIAGNOSIS — I619 Nontraumatic intracerebral hemorrhage, unspecified: Secondary | ICD-10-CM

## 2017-05-23 NOTE — Telephone Encounter (Signed)
She does not need to stop ASA. Unlikely to be related to her bleeding. She should call her GYN for advice.

## 2017-05-23 NOTE — Telephone Encounter (Signed)
Called pt - informed not to stop ASA "Unlikely to be related to her bleeding. She should call her GYN for advice." per Dr Beryle Beams. Said she called Dr Romeo Rabon, yesterday and she said the same thing "not to stop ASA". Pt also said the bleeding has slowed down.

## 2017-05-26 ENCOUNTER — Ambulatory Visit (INDEPENDENT_AMBULATORY_CARE_PROVIDER_SITE_OTHER): Payer: No Typology Code available for payment source | Admitting: Family Medicine

## 2017-05-26 ENCOUNTER — Telehealth: Payer: Self-pay | Admitting: Neurology

## 2017-05-26 ENCOUNTER — Encounter: Payer: Self-pay | Admitting: Family Medicine

## 2017-05-26 DIAGNOSIS — I10 Essential (primary) hypertension: Secondary | ICD-10-CM

## 2017-05-26 DIAGNOSIS — IMO0001 Reserved for inherently not codable concepts without codable children: Secondary | ICD-10-CM

## 2017-05-26 DIAGNOSIS — E669 Obesity, unspecified: Secondary | ICD-10-CM

## 2017-05-26 DIAGNOSIS — Z6841 Body Mass Index (BMI) 40.0 and over, adult: Secondary | ICD-10-CM

## 2017-05-26 DIAGNOSIS — R7303 Prediabetes: Secondary | ICD-10-CM

## 2017-05-26 NOTE — Telephone Encounter (Signed)
Pt request CT results she had on Friday. She is aware it can take up to 4 days for results

## 2017-05-26 NOTE — Telephone Encounter (Signed)
Looks good, the swelling and the blood have resolved. I will review the pictures with her at her appointment thanks

## 2017-05-26 NOTE — Progress Notes (Signed)
Medical Nutrition Therapy:  Appt start time: 1130 end time:  1230.  Assessment:  Primary concerns today: Weight management and Blood sugar control.  Carla Hanson has been meeting most of her goals, usually walking 4 X wk (goal is 5), bedtime by 11:30 PM, and protein at breakfast.  She has been having a couple of eggs and some [place] bacon most days, including hash browns or bread occasionally.  She has been trying to get into a consistent routine with respect to when she goes to bed (11:30 PM) and gets up (alarm set for 8 AM).  Daily routine includes studying the bible from ~10 or 10:30 AM till 2 or 2:30 PM.  Carla Hanson said she can certainly get more vegetables, and is interested in making this one of her goals.    Learning Readiness: Change in progress  Recent physical activity includes walking 2 miles 4-5 X wk, strength training (uses 3- & 5-lb DBs with a 15-min strength CD) 2-3 X wk.  She sometimes gets sore from this workout.  For her walking she usually uses a [employer] walking CD inside, but occasionally walks outside.    24-hr recall: (Up at 9 AM) B (10 AM)-   12 oz light swt tea (80 kcal), 1 1/2 c Raisin Bran, 1 c 2% milk, 5 oz low-sugar o.j. (50 kcal/8 oz) Snk ( AM)-   1 oz cheetos, water L (2:30 PM)-  2 c spin salad, 1 chx brst, 2 T chs, 10 str'ber's, 2 T dried cranber's, 3 T drsng, diet drink (5 kcal), water  Snk (3 PM)-  12 oz light swt tea  D (8:30 PM)-  2 soft beef tacos, 12 oz light swt tea  Snk ( PM)-  water Typical day? No.  Usually has no more than 1 or 2 teas per day.    Progress Towards Goal(s):  In progress.   Nutritional Diagnosis:  [place]-3.3 Overweight/obesity As related to energy balance.  As evidenced by BMI >50.    Intervention:  Nutrition education  Handouts given during visit include:  AVS  Goals Sheet  Demonstrated degree of understanding via:  Teach Back  Barriers to learning/adherence to lifestyle change: No obvious barriers; pt is enthusiastic and seems to  have good understanding of recommendations.   Monitoring/Evaluation:  Dietary intake, exercise, and body weight in 6 week(s).

## 2017-05-26 NOTE — Patient Instructions (Addendum)
-   Try a recipe for crustless quiche; find any recipe on the Internet.   - When you are studying, be sure to take a 5-min break to move some for each hour that you sit.    - Carb control:   - Limit starchy foods to 2 portions per meal.  One portion of a carb (starchy) food equals:  - 1 slice of bread or its equivalent  - 1/2 cup of scoopable carb's such as potatoes, corn, pasta, and rice.    - 15 grams of TOTAL CARBOHYDRATE as shown on a food label.  - Generally speaking, the less processing a food has had, the lower GLYCEMIC it is likely to be (will not raise your blood sugar as much).   - Eating more (non-starchy) vegetables twice daily will also help keep your blood sugar levels down b/c of the soluble fiber in veg's.  These vegetables will also be good for your blood pressure b/c all veg's are good to excellent sources of potassium.   [employer] on [employer]:  Try some beets, sweet potato, onion, and garlic tossed in olive oil and roasted at ~375 degrees for ~1 hour.    Goals:  1. Walk for 2 miles 5 X wk.  2. Get to bed by 11:30 PM every night. 3. Obtain twice as many veg's as protein or carbohydrate foods for both lunch and dinner.    Call Dr. Jenne Campus a progress report, letting me know how you are doing on [facility] goal.   Complete your Goals Sheet, and bring to follow-up appt.

## 2017-05-28 NOTE — Telephone Encounter (Signed)
Called pt w/ CT results. Voiced understanding and was extremely appreciative of call.

## 2017-05-30 NOTE — Telephone Encounter (Signed)
Told patient to try ibuprofen 600 mg twice daily over the weekend. If still having prolonged bleeding, asked her to call clinic on Monday for appointment.

## 2017-05-30 NOTE — Telephone Encounter (Signed)
Pt stated she is still bleeding and she is concerned. Wants to know if it is ok to still bleeding, would like PCP to call her today. ep

## 2017-06-02 NOTE — Telephone Encounter (Signed)
Pt called and would like to speak to Dr. Ola Spurr about a personal issue. jw

## 2017-06-03 NOTE — Telephone Encounter (Signed)
Called patient. Menstrual bleeding improved with NSAIDs. Has only very light flow at this time. Asked her to schedule an appointment if bleeding becomes heavier.

## 2017-06-04 ENCOUNTER — Ambulatory Visit (INDEPENDENT_AMBULATORY_CARE_PROVIDER_SITE_OTHER): Payer: Self-pay | Admitting: Neurology

## 2017-06-04 DIAGNOSIS — I619 Nontraumatic intracerebral hemorrhage, unspecified: Secondary | ICD-10-CM

## 2017-06-04 DIAGNOSIS — R569 Unspecified convulsions: Secondary | ICD-10-CM

## 2017-06-04 NOTE — Procedures (Signed)
    History:  Carla Hanson is a 52 year old patient with a history of a prior intracranial hemorrhage associated with Xarelto. The patient has an associated aphasia. Prior EEG studies have shown left temporal sharp wave activity. The patient is being evaluated for this issue.  This is a routine EEG. No skull defects are noted. Medications include Tylenol, Proventil, aspirin, Keppra, Hyzaar, and Dulera.   EEG classification: Normal awake  Description of the recording: The background rhythms of this recording consists of a fairly well modulated medium amplitude alpha rhythm of 10 Hz that is reactive to eye opening and closure. As the record progresses, the patient appears to remain in the waking state throughout the recording. Photic stimulation was performed, resulting in a bilateral and symmetric photic driving response. Hyperventilation was also performed, resulting in a minimal buildup of the background rhythm activities without significant slowing seen. At no time during the recording does there appear to be evidence of spike or spike wave discharges or evidence of focal slowing. EKG monitor shows no evidence of cardiac rhythm abnormalities with a heart rate of 72.  Impression: This is a normal EEG recording in the waking state. No evidence of ictal or interictal discharges are seen.

## 2017-06-06 ENCOUNTER — Telehealth: Payer: Self-pay | Admitting: *Deleted

## 2017-06-06 NOTE — Telephone Encounter (Signed)
Spoke with patient and informed her that her EEG is normal. Patient then asked if she should stop taking her Keppra. This RN advised she not stop. Patient stated that Dr Jaynee Eagles told her if her CT scan and EEG came back normal, she may take her off [place]. This RN informed patient the office is now closed. Advised her this RN will route her question to Dr Jaynee Eagles, and she will get a call back. Advised her the call will probably be next Monday.  Patient stated she would continue taking Keppra as prescribed until she hears back from Dr Jaynee Eagles. She verbalized understanding, appreciation of call.

## 2017-06-08 NOTE — Telephone Encounter (Signed)
I tried calling patient, goes straight to voicemail and the message says the recipient's name is [name] so I hesitate to leave a message. Would you call and see if you can talk to [patient please Carla Hanson? The swelling and blood in her brain has resolved.  However there is some damage to the brain in the area of the bleed that is permanent. I can't promise that this part of the brain is not a seizure focus but the eeg was normal and we can stop the keppra. Would take it once daily for 1 week then stop.Again, a normal eeg is not a guarantee but it is reassuring. If we stop the keppra and she has another seizure we would have to restart. thanks

## 2017-06-10 ENCOUNTER — Encounter: Payer: Self-pay | Admitting: Internal Medicine

## 2017-06-10 ENCOUNTER — Ambulatory Visit (INDEPENDENT_AMBULATORY_CARE_PROVIDER_SITE_OTHER): Payer: No Typology Code available for payment source | Admitting: Internal Medicine

## 2017-06-10 VITALS — BP 140/82 | HR 77 | Temp 98.2°F | Ht 64.0 in | Wt 290.0 lb

## 2017-06-10 DIAGNOSIS — I10 Essential (primary) hypertension: Secondary | ICD-10-CM

## 2017-06-10 DIAGNOSIS — M25561 Pain in right knee: Secondary | ICD-10-CM

## 2017-06-10 MED ORDER — LOSARTAN POTASSIUM-HCTZ 50-12.5 MG PO TABS: 1.0000 | ORAL_TABLET | Freq: Every day | ORAL | 9 refills | Status: DC

## 2017-06-10 NOTE — Patient Instructions (Addendum)
Carla Hanson,  I believe you strained your knee, and this should get better with icing, rest, elevation and compression (if you want to invest in a compression sleeve--over the counter is fine). Let pain be your guide. Hopefully, you will feel well enough to start walking mid-next week. Take 500 mg tylenol twice daily to help with inflammation.  Your neurologist advised taking keppra only once daily for 1 week, then to stop, as there was no seizure activity on your EEG.  I will fax the medication order to your pharmacy over lunch.  Please follow-up mid-September for lipid recheck and blood sugar check.  Best, Dr. Ola Spurr

## 2017-06-10 NOTE — Telephone Encounter (Signed)
Pt returned call. Rn was skyped, she will call the patient back.

## 2017-06-10 NOTE — Telephone Encounter (Signed)
Called pt w/ instructions for tapering off of Keppra and discussed possible risk of experiencing another seizure if not on medication. Questions answered. Pt verbalized understanding of returning to ER in case of emergency and/or letting our office know of any s/s of seizure so that medication can be restarted. Voiced appreciation for call.

## 2017-06-10 NOTE — Progress Notes (Signed)
[facility] Progress Note  Subjective:  Carla Hanson is a 51 y.o. female with history of HTN, T2DM, bipolar disorder, HLD and obesity who presents for R knee pain x 1 week. She is not sure how exactly it began but did notice a "twinge" in her outer R knee after twisting her knee somewhat when taking a step. Occasionally pops. She has been icing the area and elevating. She initially noted swelling but says this has improved. She normally enjoys walking a couple miles a day but has been refraining since pain started. She has not taken any medication for discomfort. Says pain has improved somewhat.  ROS: No falls, no fever  Objective: Blood pressure 140/82, pulse 77, temperature 98.2 F (36.8 C), temperature source Oral, height 5\' 4"  (1.626 m), weight 290 lb (131.5 kg), last menstrual period 05/17/2017, SpO2 97 %. Body mass index is 49.78 kg/m. Constitutional: Obese female in NAD, very pleasant Cardiovascular: RRR, S1, S2, no m/r/g.  Pulmonary/Chest: Effort normal and breath sounds normal. No respiratory distress.  Musculoskeletal: No knee effusion noted, no increased warmth or redness. Mild bilateral crepitus. Somewhat increased laxity of R patella. Slight discomfort with patellar grind on R. TTP over medial and lateral joint lines of R knee and over medial joint line of L knee. No discomfort with McMurray's and minimal discomfort with Thessaly's. Negative anterior and posterior drawer testing.   Neurological: AOx3, no focal deficits. Skin: Skin is warm and dry. No rash noted. No erythema.  Psychiatric: Anxious affect.  Vitals reviewed  Assessment/Plan: Acute pain of right knee - Improving with rest, ice and elevation. Differential includes patellofemoral syndrome and patellar tendonopathy. Low suspicion for meniscal injury given good mobility on exam without clicking/catcing and more generalized discomfort rather than within joint line itself. - Recommended continuing  conservative measures. Suggested taking tylenol for pain and trying compression sleeve to help remind patient to "baby" her injury. Resume walking as pain allows.  Follow-up next month for lipid recheck and blood sugar check.  Olene Floss, MD [place], PGY-3

## 2017-06-12 DIAGNOSIS — M25561 Pain in right knee: Secondary | ICD-10-CM | POA: Insufficient documentation

## 2017-06-12 NOTE — Assessment & Plan Note (Signed)
-   Improving with rest, ice and elevation. Differential includes patellofemoral syndrome and patellar tendonopathy. Low suspicion for meniscal injury given good mobility on exam without clicking/catcing and more generalized discomfort rather than within joint line itself. - Recommended continuing conservative measures. Suggested taking tylenol for pain and trying compression sleeve to help remind patient to "baby" her injury. Resume walking as pain allows.

## 2017-06-16 ENCOUNTER — Telehealth: Payer: Self-pay | Admitting: Neurology

## 2017-06-16 NOTE — Telephone Encounter (Signed)
She should still be able to taper off Keppra as planned.

## 2017-06-16 NOTE — Telephone Encounter (Signed)
Pt was seen by Dr. Jaynee Eagles in Feb after hospitalization in Feb for hemorrhagic stroke associated w/ Xarelto and s/s of seizure secondary to [place] for which she was prescribed Keppra 500 mg BID. Repeat CT in July showed resolution of edema/bleed and repeat EEG earlier this month was negative for seizure activity. Pt reported side effects of headache and tiredness when on Keppra. Due to normal EEG and pt request, she was instructed on how to taper off of the medication last week and has been taking 500 mg daily since 06/11/17. She called today reporting tremors in her legs. Said that she's had this happen a couple of times a day, maybe 3-5 days while on the Keppra BID but it happened again last night for 3-5 mins while on daily Keppra. She plans on taking her last daily dose of Keppra on 06/18/17 and then will stop it unless advised otherwise. Denies any other s/s of seizure.

## 2017-06-16 NOTE — Telephone Encounter (Signed)
Patient calling to discuss having tremors in her legs and taking levETIRAcetam (KEPPRA) 500 MG tablet.

## 2017-06-17 NOTE — Telephone Encounter (Signed)
Pt returned RN's call. She was instructed to continue tapering off keppra per RN.

## 2017-06-20 ENCOUNTER — Encounter: Payer: Self-pay | Admitting: Internal Medicine

## 2017-06-20 ENCOUNTER — Ambulatory Visit (INDEPENDENT_AMBULATORY_CARE_PROVIDER_SITE_OTHER): Payer: No Typology Code available for payment source | Admitting: Internal Medicine

## 2017-06-20 DIAGNOSIS — M25561 Pain in right knee: Secondary | ICD-10-CM

## 2017-06-20 NOTE — Progress Notes (Signed)
Date of Visit: 06/20/2017   HPI:  [name] is a 51 y.o. female with history of obesity, HTN, CVA, DVT/PE, and bipolar disorder presents to clinic today for follow up of right knee pain. She was seen in clinic last week for assessment of this pain, which she attributes to twisting her knee in her kitchen. Over the past week, her pain has improved. She now mainly complains of some lingering stiffness in the R knee. She does mention that if she stands for too long or walks for a significant distance she will have some pain, but is able to walk around her houses and handle ADLs without disturbance. She has tried icing and elevating her knee along with Tylenol for pain management with tolerable pain. Patient denies SOB or swelling of legs.  ROS: See HPI.  [place]: Patient states that she was starting to begin walking for exercise in an attempt to lose weight when this injury occurred. She mentions that walking on a treadmill is her favorite for of exercise    PHYSICAL EXAM: BP 122/80   Pulse 70   Temp 98.8 F (37.1 C) (Oral)   Wt 294 lb 12.8 oz (133.7 kg)   SpO2 98%   BMI 50.60 kg/m  Gen: NAD, pleasant  Heart: RRR, nMRG, nl S1, S2 Lungs: CAB, normal work of breathing  Neuro: AOx3, no focal defects  Ext: no peripheral edema or swelling of extremities  Msk: no swelling of right knee, no pain to palpation of medical or lateral surfaces of R knee, no pain to manipulation of patella, strength intact bilateral lower extremities, no weakness of flexion or extension of lower extremities, crepitus palpable on passive motion of bilateral knees   ASSESSMENT/PLAN:   Acute pain of right knee Patient is her for one week follow up for right knee pain after twisting it in kitchen. She endorses an improvement in her pain and relief from residual pain by ice and tylenol. She does mention that there is still stiffness, which could be expected following acute knee injury. -counseled that knee seems to be  improving, stiffness is normal and should resolve as injury continues to heal -continue taking tylenol ands using ice as needed to manage pain -provided advise on exercise methods such as pool aerobics and exercise biking that would produce less stress on patient's knee while it heals

## 2017-06-20 NOTE — Patient Instructions (Addendum)
Ms. Rauen,  I recommend trying some pool walking and/or stationary bike activity to help with stiffness. You can also try stretches with knee to chest.  I am glad your pain is improving. Continue tylenol, elevating, and icing.  Best, Dr. Ola Spurr

## 2017-06-20 NOTE — Assessment & Plan Note (Signed)
Patient is her for one week follow up for right knee pain after twisting it in kitchen. She endorses an improvement in her pain and relief from residual pain by ice and tylenol. She does mention that there is still stiffness, which could be expected following acute knee injury. -counseled that knee seems to be improving, stiffness is normal and should resolve as injury continues to heal -continue taking tylenol ands using ice as needed to manage pain -provided advise on exercise methods such as pool aerobics and exercise biking that would produce less stress on patient's knee while it heals

## 2017-06-26 ENCOUNTER — Telehealth: Payer: Self-pay | Admitting: *Deleted

## 2017-06-26 NOTE — Telephone Encounter (Signed)
Left message for patient. Asked if she had any specific concerns. Let her know there were no specific symptoms I would be worried about for her taking hyzaar. Reaffirmed that her kidney and liver function is excellent and that we would continue to monitor labs annually. Suggested speaking to her local pharmacist for additional medication counseling.  Olene Floss, MD [place], PGY-3

## 2017-06-26 NOTE — Telephone Encounter (Signed)
Pt calls the nurse line.  She wants to know what the risk and side effects are of the Hyzaar.  She has been on it one year, but would like to know "what to expect".  She request that Dr. Ola Spurr calls her back. Manish Ruggiero, Salome Spotted, CMA

## 2017-07-09 ENCOUNTER — Telehealth: Payer: Self-pay | Admitting: Internal Medicine

## 2017-07-09 NOTE — Telephone Encounter (Signed)
Patient would like for you to call her, she says she has a couple of questions but would not say what, thanks.  Best number [phone]

## 2017-07-10 NOTE — Telephone Encounter (Signed)
Called patient. She is having an occasional "whooshing" in her ears. Can be either ear, more left than right though. Denies dizziness or weakness. Denies congestion. She is concerned she is not splitting her blood pressure medications equally (was given hyzaar 100-25 mg pills at [facility] to split). Suggested asking pharmacy to pre-split these or getting a pill cutter. Patient has an appointment with me 10/8. Offered sooner appointment, but she has some commitments until then. Provided reassurance.

## 2017-07-10 NOTE — Telephone Encounter (Signed)
Pt is calling back and would really like to speak to her doctor. jw

## 2017-07-14 ENCOUNTER — Ambulatory Visit: Payer: No Typology Code available for payment source | Admitting: Family Medicine

## 2017-07-25 ENCOUNTER — Ambulatory Visit: Payer: No Typology Code available for payment source | Admitting: Internal Medicine

## 2017-08-11 ENCOUNTER — Ambulatory Visit (INDEPENDENT_AMBULATORY_CARE_PROVIDER_SITE_OTHER): Payer: Self-pay | Admitting: Internal Medicine

## 2017-08-11 ENCOUNTER — Encounter: Payer: Self-pay | Admitting: Internal Medicine

## 2017-08-11 VITALS — BP 130/85 | HR 75 | Temp 98.8°F | Ht 64.0 in | Wt 292.2 lb

## 2017-08-11 DIAGNOSIS — Z23 Encounter for immunization: Secondary | ICD-10-CM

## 2017-08-11 DIAGNOSIS — R5383 Other fatigue: Secondary | ICD-10-CM

## 2017-08-11 DIAGNOSIS — E785 Hyperlipidemia, unspecified: Secondary | ICD-10-CM

## 2017-08-11 DIAGNOSIS — Z8673 Personal history of transient ischemic attack (TIA), and cerebral infarction without residual deficits: Secondary | ICD-10-CM

## 2017-08-11 DIAGNOSIS — R7303 Prediabetes: Secondary | ICD-10-CM

## 2017-08-11 DIAGNOSIS — N926 Irregular menstruation, unspecified: Secondary | ICD-10-CM

## 2017-08-11 DIAGNOSIS — Z1211 Encounter for screening for malignant neoplasm of colon: Secondary | ICD-10-CM

## 2017-08-11 LAB — POCT HEMOGLOBIN: Hemoglobin: 8 g/dL — AB (ref 12.2–16.2)

## 2017-08-11 LAB — POCT GLYCOSYLATED HEMOGLOBIN (HGB A1C): HEMOGLOBIN A1C: 5.6

## 2017-08-11 NOTE — Patient Instructions (Addendum)
Ms. Faeth,  I will call you with your cholesterol level results.  Your blood sugar is improved from last check--you are no longer in prediabetes range!  Continue daily exercise, and I love your plan to buy whole foods as much as able.  When looking at labels, your goal is less than 2 g of sodium a day.  Please see me back if your blood pressure is above 140/90.  Otherwise, see me back in a few months to see how your weight is doing.  Best, Dr. Ola Spurr

## 2017-08-11 NOTE — Progress Notes (Signed)
[facility] Progress Note  Subjective:  Carla Hanson is a 51 y.o. female with history of anxiety, bipolar disorder, HTN, hemorrhagic stroke, DVT/PE and obesity. She presents to have lipid and A1c screening. She also notes concern of changes to her menstrual cycle.  #Hx stroke: - Had hemorrhagic stroke on xarelto (for history of blood clots) in February. Have been working to decrease risk factors for repeat stroke. However, patient wary of medications and trying lifestyle modification before wanting to consider metformin or statin. - A1c in June was 5.7 - LDL was 139 in March - Does daily chair exercises and starting to do walking after recent knee injury. Says diet has not been as strict lately due to being out of her normal routine since grandchild born; plans to re-commit to baked foods and whole foods.  #Changes to menstrual cycle: - Has had more irregularity of her periods. She had a prolonged cycle this summer, a short cycle Sept 13-15th and is currently menstruating and has been for the last 8-9 days though bleeding now very light.  - Feeling more fatigued lately - History of iron deficiency anemia. No longer taking OTC iron supplements.  Allergies  Allergen Reactions  . Latuda [Lurasidone] Other (See Comments)    Made leg muscles twitch (went off this herself in December, 2017)   Social: Former smoker  Objective: Blood pressure 130/85, pulse 75, temperature 98.8 F (37.1 C), temperature source Oral, height 5\' 4"  (1.626 m), weight 292 lb 3.2 oz (132.5 kg), SpO2 100 %. Body mass index is 50.16 kg/m. Constitutional: Morbidly obese female in NAD Cardiovascular: RRR, S1, S2, no m/r/g.  Pulmonary/Chest: Effort normal and breath sounds normal. No respiratory distress. Abd: +BS, soft, NT  Musculoskeletal: No LE edema Neurological: AOx3, no focal deficits. Psychiatric: Anxious affect.  Vitals reviewed  POC hemoglobin 8.0 (BL ~ 10)  Assessment/Plan: History of  stroke - BP well controlled today. Patient would like to come off medication, but counseled on importance of remaining on anti-hypertensive to reduce risk of heart attack and stroke. May be able to wean off if has substantial weight loss. - A1c now in prediabetes range. Encouraged patient to continue daily exercise and avoiding processed foods. - Will obtain lipid panel - Continue daily asa  Irregular menstrual cycle - Suspect patient is perimenopausal.  - Given drop in hgb, recommended restarting iron supplementation as patient has documented history of iron deficiency anemia. Does not want colonoscopy, so provided stool cards. - Would consider obtaining [place] level, transvaginal US if has prolonged and HEAVY menstrual bleeding  TDAP given, as patient due and has newborn in home. Declines flu shot at this time.   Follow-up in about 6 weeks for CBC recheck or sooner if periods remain heavy.  Olene Floss, MD [place], PGY-3

## 2017-08-12 ENCOUNTER — Other Ambulatory Visit: Payer: Self-pay | Admitting: Internal Medicine

## 2017-08-12 LAB — LIPID PANEL
CHOLESTEROL TOTAL: 192 mg/dL (ref 100–199)
Chol/HDL Ratio: 4.3 ratio (ref 0.0–4.4)
HDL: 45 mg/dL (ref >39)
LDL Calculated: 112 mg/dL — ABNORMAL HIGH (ref 0–99)
TRIGLYCERIDES: 177 mg/dL — AB (ref 0–149)
VLDL Cholesterol Cal: 35 mg/dL (ref 5–40)

## 2017-08-12 MED ORDER — FERROUS SULFATE 325 (65 FE) MG PO TABS: 325.0000 mg | ORAL_TABLET | Freq: Two times a day (BID) | ORAL | 2 refills | Status: DC

## 2017-08-13 DIAGNOSIS — N926 Irregular menstruation, unspecified: Secondary | ICD-10-CM | POA: Insufficient documentation

## 2017-08-13 DIAGNOSIS — Z8673 Personal history of transient ischemic attack (TIA), and cerebral infarction without residual deficits: Secondary | ICD-10-CM | POA: Insufficient documentation

## 2017-08-13 NOTE — Assessment & Plan Note (Signed)
-   BP well controlled today. Patient would like to come off medication, but counseled on importance of remaining on anti-hypertensive to reduce risk of heart attack and stroke. May be able to wean off if has substantial weight loss. - A1c now in prediabetes range. Encouraged patient to continue daily exercise and avoiding processed foods. - Will obtain lipid panel - Continue daily asa

## 2017-08-13 NOTE — Assessment & Plan Note (Addendum)
-   Suspect patient is perimenopausal.  - Given drop in hgb, recommended restarting iron supplementation as patient has documented history of iron deficiency anemia. Does not want colonoscopy, so provided stool cards. - Would consider obtaining [place] level, transvaginal US if has prolonged and HEAVY menstrual bleeding

## 2017-08-18 ENCOUNTER — Ambulatory Visit (INDEPENDENT_AMBULATORY_CARE_PROVIDER_SITE_OTHER): Payer: No Typology Code available for payment source | Admitting: Family Medicine

## 2017-08-18 ENCOUNTER — Encounter: Payer: Self-pay | Admitting: Family Medicine

## 2017-08-18 DIAGNOSIS — Z6841 Body Mass Index (BMI) 40.0 and over, adult: Secondary | ICD-10-CM

## 2017-08-18 DIAGNOSIS — I10 Essential (primary) hypertension: Secondary | ICD-10-CM

## 2017-08-18 NOTE — Progress Notes (Signed)
Medical Nutrition Therapy:  Appt start time: 1330 end time:  1430.  Assessment:  Primary concerns today: Weight management and Blood sugar control.  Novice has not exercised as much recently; busy with her new grandson (<1 mo old), and she has twisted her knee again.  In addition, her hgb was down to 8 last week, so she is winded easily with exercise.  She is taking an Fe supplement daily, trying to take along with orange juice.    We did not complete a 24-hr recall today, but Jeanenne said she has increased her vegetable intake, including going to the farmer's market weekly.   She is interested in doing even better in terms of veg intake.    Learning Readiness: Change in progress  Recent physical activity includes ~15 min a few times a week of some kind of weight training with dumbbells.    Progress Towards Goal(s):  In progress.   Nutritional Diagnosis:  No progress noted on [place]-3.3 Overweight/obesity As related to energy balance.  As evidenced by BMI >50.    Intervention:  Nutrition education  Handouts given during visit include:  AVS  Goals Sheet (revised)  Demonstrated degree of understanding via:  Teach Back  Barriers to learning/adherence to lifestyle change: No obvious barriers; pt is enthusiastic and seems to have good understanding of recommendations.   Monitoring/Evaluation:  Dietary intake, exercise, and body weight in 8 week(s).

## 2017-08-18 NOTE — Patient Instructions (Addendum)
Goals: 1. When you take your iron supplement, try to take it on an empty stomach (1 hr before eating or 2 hrs after), AND take it with about 200 mg vitamin C.  (More is also ok if you can't find a 200 mg dose.)   If iron 2 X day bothers your stomach or gives you constipation, cut back to once a day.   2. Obtain twice as many veg's as protein or carbohydrate foods for both lunch and dinner.  In other words, carb (starchy) foods should make up no more than 1/4 of your meal.   3. Walk at least 10 min 3 X wk.  Recommendation: GO EARLY IN THE DAY.   - Complete your Goals Sheet, and bring to follow-up on Dec 10.   - Email [email or web address] or call [phone] to report progress no later than the week of November 12.      An important part of being able to decrease or discontinue BP med's is to not only decrease intake of sodium, but to INCREASE potassium.  ALL fruits and veg's are good to excellent sources of potassium.    Lab Results from 08/11/2017  Component Value Normal   CHOL 192 <200   HDL 45 >40   LDL 112 (H) <100   TRIG 177 (H) <150   CHOLHDL 4.3 <4.4   Your triglyceride level is a type of fat in your blood, but it is increased by dietary carbohydrate, especially refined carb's (like sugar).  To help decrease TRIG, limit the amount of sugary foods, fruit juice, and white-flour foods (starchy foods such as baked goods, breads, crackers, cereal).    Remember that taste preferences are learned.  This means you can learn to like coffee without the flavored creamer.  For example, try using regular milk and a small amount of sugar.  The creamer you have been using has the equivalent of more than 1 tsp of sugar in each tablespoon of creamer.  You might want to try using instead of flavored (sweetened) creamer, milk along with some sugar, and maybe some cinnamon and ginger.    Collards (or any leafy greens): In a small amount of olive oil & minced garlic, add greens, and with a spatula,  mix them around, trying to get at least a bit of oil on all of it.  Saute just a couple of minutes, then add a small amount (~1/4 cup) of water, cover, and turn off the heat.

## 2017-09-02 ENCOUNTER — Encounter: Payer: Self-pay | Admitting: Internal Medicine

## 2017-09-02 ENCOUNTER — Ambulatory Visit (INDEPENDENT_AMBULATORY_CARE_PROVIDER_SITE_OTHER): Payer: Self-pay | Admitting: Internal Medicine

## 2017-09-02 VITALS — BP 118/68 | HR 55 | Temp 98.4°F | Ht 64.0 in | Wt 293.0 lb

## 2017-09-02 DIAGNOSIS — R05 Cough: Secondary | ICD-10-CM

## 2017-09-02 DIAGNOSIS — D509 Iron deficiency anemia, unspecified: Secondary | ICD-10-CM

## 2017-09-02 DIAGNOSIS — R059 Cough, unspecified: Secondary | ICD-10-CM

## 2017-09-02 DIAGNOSIS — M546 Pain in thoracic spine: Secondary | ICD-10-CM

## 2017-09-02 DIAGNOSIS — R062 Wheezing: Secondary | ICD-10-CM

## 2017-09-02 LAB — POCT HEMOGLOBIN: Hemoglobin: 10 g/dL — AB (ref 12.2–16.2)

## 2017-09-02 MED ORDER — PREDNISONE 20 MG PO TABS: 40.0000 mg | ORAL_TABLET | Freq: Every day | ORAL | 0 refills | Status: DC

## 2017-09-02 MED ORDER — ALBUTEROL SULFATE (2.5 MG/3ML) 0.083% IN NEBU
2.5000 mg | INHALATION_SOLUTION | Freq: Once | RESPIRATORY_TRACT | Status: AC
  Administered 2017-09-02: 2.5 mg via RESPIRATORY_TRACT

## 2017-09-02 MED ORDER — ALBUTEROL SULFATE (2.5 MG/3ML) 0.083% IN NEBU: 2.5000 mg | INHALATION_SOLUTION | Freq: Four times a day (QID) | RESPIRATORY_TRACT | 12 refills | Status: DC | PRN

## 2017-09-02 MED ORDER — CYCLOBENZAPRINE HCL 10 MG PO TABS: 5.0000 mg | ORAL_TABLET | Freq: Three times a day (TID) | ORAL | 0 refills | Status: DC | PRN

## 2017-09-02 MED ORDER — CYCLOBENZAPRINE HCL 5 MG PO TABS: 5.0000 mg | ORAL_TABLET | Freq: Three times a day (TID) | ORAL | 0 refills | Status: DC | PRN

## 2017-09-02 MED ORDER — IPRATROPIUM BROMIDE 0.02 % IN SOLN
0.5000 mg | Freq: Once | RESPIRATORY_TRACT | Status: AC
  Administered 2017-09-02: 0.5 mg via RESPIRATORY_TRACT

## 2017-09-02 MED ORDER — DOXYCYCLINE HYCLATE 100 MG PO TABS: 100.0000 mg | ORAL_TABLET | Freq: Two times a day (BID) | ORAL | 0 refills | Status: DC

## 2017-09-02 NOTE — Patient Instructions (Signed)
Ms. Spaeth,  Please take doxycycline 100 mg twice daily for the next 7 days and prednisone 40 mg (2 tabs) with breakfast for the next 5 days. Continue albuterol as needed.  Please take half a tablet of flexeril up to 3 times daily for back spasm. Tylenol and heat can help as well.  If your next menstrual cycle is prolonged or heavy, please let me know, and I'll schedule you for an ultrasound.  Best, Dr. Ola Spurr

## 2017-09-02 NOTE — Addendum Note (Signed)
Addended by: [place] Pink on: 09/02/2017 09:35 AM   Modules accepted: Orders

## 2017-09-03 DIAGNOSIS — R059 Cough, unspecified: Secondary | ICD-10-CM | POA: Insufficient documentation

## 2017-09-03 DIAGNOSIS — R05 Cough: Secondary | ICD-10-CM | POA: Insufficient documentation

## 2017-09-03 DIAGNOSIS — M546 Pain in thoracic spine: Secondary | ICD-10-CM | POA: Insufficient documentation

## 2017-09-03 NOTE — Progress Notes (Signed)
[facility] Progress Note  Subjective:  Carla Hanson is a 51 y.o. female with history of anxiety, bipolar disorder, HTN, hemorrhagic stroke, DVT/PE and obesity. She presents for cough and back pain.   #Back pain: - Began 2 days ago. Thinks it may have begun during church Sunday with sitting funny. - No radiation of pain down legs.  - Pain comes and goes and feels like a tightness in her mid back. Has taken a couple tylenol without much relief. ROS: No bowel or bladder incontinence, no trouble with gait  #Cough: - Present for the last week. Notes some increased shortness of breath and need to use inhaler. Cough productive of phlegm.  - Quit smoking earlier this year - Last treated for pna and COPD exacerbation 10/2016  Allergies  Allergen Reactions  . Latuda [Lurasidone] Other (See Comments)    Made leg muscles twitch (went off this herself in December, 2017)    Social History  Substance Use Topics  . Smoking status: Former Smoker    Packs/day: 0.30    Years: 30.00    Types: Cigarettes    Start date: 11/04/1981    Quit date: 10/2016  . Smokeless tobacco: Never Used  . Alcohol use No     Comment: Quit 10/2016    Objective: Blood pressure 118/68, pulse (!) 55, temperature 98.4 F (36.9 C), temperature source Oral, height 5\' 4"  (1.626 m), weight 293 lb (132.9 kg), SpO2 99 %. Body mass index is 50.29 kg/m. Constitutional: Obese female, sitting twisted in her chair in mild discomfort HENT: MMM, mildly erythematous posterior oropharynx Cardiovascular: RRR, S1, S2, no m/r/g.  Pulmonary/Chest: Diffuse inspiratory and expiratory wheezes but no increased WOB and talking easily in complete sentences.   MSK: No midline spinal TTP. Somewhat increased tension and TTP over thoracic paraspinal muscles.  Neurological: AOx3, no focal deficits. Psychiatric: Somewhat anxious Vitals reviewed  Assessment/Plan: Cough - Suspect mild COPD exacerbation given productive cough  and increased shortness of breath. No focal lung findings to suggest pneumonia and afebrile. - Ordered duoneb treatment; patient reported improvement in breathing prior to leaving clinic - Ordered doxycycline x 7 days and prednisone 40 mg x 5 days (confirmed with [facility] pharmacy that these are in stock and would be available today) - Refilled albuterol  Acute bilateral thoracic back pain - Exam consistent with muscle strain. Cough possible trigger. No midline spinal tenderness to suggest disc problem.  - Recommended heat therapy, flexeril prn and tylenol  Patient also requests repeat POC hgb, as was low at last visit. Has been on iron supplementation only about 3 weeks. However, improved at 10.0 from 8.0 at last visit.  Follow-up prn. Advised to call if next menstrual cycle is abnormal; would proceed with transvaginal US at that point.   Olene Floss, MD [place], PGY-3

## 2017-09-03 NOTE — Assessment & Plan Note (Signed)
-   Suspect mild COPD exacerbation given productive cough and increased shortness of breath. No focal lung findings to suggest pneumonia and afebrile. - Ordered duoneb treatment; patient reported improvement in breathing prior to leaving clinic - Ordered doxycycline x 7 days and prednisone 40 mg x 5 days (confirmed with [facility] pharmacy that these are in stock and would be available today) - Refilled albuterol

## 2017-09-03 NOTE — Assessment & Plan Note (Signed)
-   Exam consistent with muscle strain. Cough possible trigger. No midline spinal tenderness to suggest disc problem.  - Recommended heat therapy, flexeril prn and tylenol

## 2017-09-05 LAB — FECAL OCCULT BLOOD, IMMUNOCHEMICAL: FECAL OCCULT BLD: NEGATIVE

## 2017-09-11 ENCOUNTER — Other Ambulatory Visit: Payer: Self-pay | Admitting: Internal Medicine

## 2017-09-11 ENCOUNTER — Telehealth: Payer: Self-pay | Admitting: Internal Medicine

## 2017-09-11 DIAGNOSIS — N920 Excessive and frequent menstruation with regular cycle: Secondary | ICD-10-CM

## 2017-09-11 NOTE — Telephone Encounter (Signed)
Received refill request from [facility] for [place]. Called patient to ask if she is still taking this. She is not. Will not fill.  In addition, discussed having transvaginal ultrasound performed for heavier menstrual periods and drop in hemoglobin with recent normal FIT testing. She is agreeable to this. Will request assistance with scheduling appointment.   Olene Floss, MD [place], PGY-3

## 2017-09-17 ENCOUNTER — Ambulatory Visit (HOSPITAL_COMMUNITY): Payer: Self-pay

## 2017-09-23 ENCOUNTER — Ambulatory Visit (HOSPITAL_COMMUNITY): Admission: RE | Admit: 2017-09-23 | Payer: Self-pay | Source: Ambulatory Visit

## 2017-10-13 ENCOUNTER — Ambulatory Visit: Payer: Self-pay | Admitting: Family Medicine

## 2017-10-29 ENCOUNTER — Encounter: Payer: Self-pay | Admitting: Family Medicine

## 2017-10-29 ENCOUNTER — Other Ambulatory Visit: Payer: Self-pay

## 2017-10-29 ENCOUNTER — Ambulatory Visit (INDEPENDENT_AMBULATORY_CARE_PROVIDER_SITE_OTHER): Payer: Self-pay | Admitting: Family Medicine

## 2017-10-29 DIAGNOSIS — J069 Acute upper respiratory infection, unspecified: Secondary | ICD-10-CM

## 2017-10-29 MED ORDER — ALBUTEROL SULFATE (2.5 MG/3ML) 0.083% IN NEBU: 2.5000 mg | INHALATION_SOLUTION | Freq: Four times a day (QID) | RESPIRATORY_TRACT | 12 refills | Status: DC | PRN

## 2017-10-29 MED ORDER — PREDNISONE 20 MG PO TABS: 40.0000 mg | ORAL_TABLET | Freq: Every day | ORAL | 0 refills | Status: DC

## 2017-10-29 NOTE — Assessment & Plan Note (Addendum)
Symptoms consistent with URI, likely viral.  She is otherwise afebrile and well-appearing without red flags on exam. Audible wheeze, however O2 sat 98% and comfortable on RA; expect her symptoms will improve with steroid taper and albuterol.  Do not suspect pneumonia as no focal lung findings and she has been afebrile.  -Rx Refill albuterol and Prednisone for chest tightness and wheezing  -Advised Mucinex for cough, warm fluids and honey for sore throat -May take Tylenol for fevers or discomfort -Advised frequent handwashing to avoid spread  -return precautions discussed

## 2017-10-29 NOTE — Patient Instructions (Addendum)
You were seen in clinic for cough which is most likely due to an upper respiratory tract infection.  I have refilled your albuterol neb solution and prednisone.  You can take 2 tabs (40 mg daily) for 5 days.  I expect your breathing should improve with these. It is important that you continue to drink plenty of fluids to stay well-hydrated as you recover. You may take Tylenol if you have a fever or body aches.  If you develop any new or worsening symptoms, I would like for you to come in and be seen again.    Be well, Lovenia Kim, MD

## 2017-10-29 NOTE — Progress Notes (Signed)
   Subjective:   Patient ID: Carla Hanson    DOB: [birth year], 51 y.o. female   MRN: [id number]  CC: cough, chills   HPI: Carla Hanson is a 51 y.o. female who presents to clinic today for cough.   Cough Symptoms persistent x 1 week.  She reports a tightness in her chest and wheezing.  Cough is present x 1.5 weeks and productive of clear thick sputum.  Also with chills and body aches which have improved.   She has been doing breathing treatments which help and she is out of albuterol.  Denies  fevers, nausea, vomiting, diarrhea, abdominal pain, chest pain, shortness of breath, leg swelling, palpitations.  No history of allergies. Has been eating and drinking well.  Has not had her flu shot, declines it today.   ROS: See HPI for pertinent ROS.   Social: former smoker  Medications reviewed.  Objective:   BP 128/78   Pulse 86   Temp 98.2 F (36.8 C) (Oral)   Wt (!) 300 lb 9.6 oz (136.4 kg)   SpO2 98%   BMI 51.60 kg/m  Vitals and nursing note reviewed.  General: 51 yo female, NAD  HEENT: NCAT, EOMI, PERRL, MMM, o/o clear, no tonsillar exudate or erythema  Neck: supple, non-tender  CV: RRR no MRG Lungs: CTAB, normal WOB, expiratory wheeze present bilaterally  Abdomen: soft,NTND, +bs  Skin: warm, dry, no rash  Extremities: warm and well perfused   Assessment & Plan:   URI (upper respiratory infection) Symptoms consistent with URI, likely viral.  She is otherwise afebrile and well-appearing without red flags on exam. Audible wheeze, however O2 sat 98% and comfortable on RA; expect her symptoms will improve with steroid taper and albuterol.  Do not suspect pneumonia as no focal lung findings and she has been afebrile.  -Rx Refill albuterol and Prednisone for chest tightness and wheezing  -Advised Mucinex for cough, warm fluids and honey for sore throat -May take Tylenol for fevers or discomfort -Advised frequent handwashing to avoid spread  -return precautions discussed  Meds  ordered this encounter  Medications  . albuterol (PROVENTIL) (2.5 MG/3ML) 0.083% nebulizer solution    Sig: Take 3 mLs (2.5 mg total) by nebulization every 6 (six) hours as needed for wheezing or shortness of breath.    Dispense:  75 mL    Refill:  12  . predniSONE (DELTASONE) 20 MG tablet    Sig: Take 2 tablets (40 mg total) by mouth daily with breakfast.    Dispense:  10 tablet    Refill:  0   Follow up: if symptoms worsen or fail to improve   Lovenia Kim, MD [place], PGY-2 10/29/2017 3:49 PM

## 2017-10-30 ENCOUNTER — Telehealth: Payer: Self-pay | Admitting: Internal Medicine

## 2017-10-30 NOTE — Telephone Encounter (Signed)
Patient needs albuterol solution for nebulizer asap.  She is waiting at pharmacy now [facility]  She says she could not get through here to nurse (after transferred).

## 2017-10-30 NOTE — Telephone Encounter (Signed)
Appears that the Rx for these was set for phone in and was not sent electronically.  [place] Dept and called these in. [name], April D, [place]

## 2017-11-13 ENCOUNTER — Telehealth: Payer: Self-pay | Admitting: Internal Medicine

## 2017-11-13 NOTE — Telephone Encounter (Signed)
Called patient and made her an appointment for the 11/18/2017 at 0950 for referral.[name], Carla Hanson

## 2017-11-13 NOTE — Telephone Encounter (Signed)
Pt would like a referral to a dentist asap, she has broken a tooth and wants to get it fixed.

## 2017-11-18 ENCOUNTER — Ambulatory Visit: Payer: Self-pay | Admitting: Internal Medicine

## 2017-11-18 ENCOUNTER — Telehealth: Payer: Self-pay | Admitting: Internal Medicine

## 2017-11-18 ENCOUNTER — Ambulatory Visit: Payer: Self-pay | Admitting: Family Medicine

## 2017-11-18 DIAGNOSIS — K0889 Other specified disorders of teeth and supporting structures: Secondary | ICD-10-CM

## 2017-11-18 NOTE — Telephone Encounter (Signed)
Called patient, as she missed today's appointment for tooth pain. She said she had to help her daughter with something and meant to call to cancel. She reports having broken her tooth. She says her [place] is still active. I placed referral to Dentistry within [facility]. I also left a list of dental resources up front for patient. She expressed understanding.   Olene Floss, MD [place], PGY-3

## 2017-11-18 NOTE — Telephone Encounter (Signed)
Pt would like some muscle relaxer[facility]

## 2017-11-19 ENCOUNTER — Other Ambulatory Visit: Payer: Self-pay | Admitting: Internal Medicine

## 2017-11-19 MED ORDER — CYCLOBENZAPRINE HCL 10 MG PO TABS: 5.0000 mg | ORAL_TABLET | Freq: Three times a day (TID) | ORAL | 0 refills | Status: DC | PRN

## 2017-11-19 NOTE — Telephone Encounter (Signed)
Placed paper Rx in fax pile to [facility].  Olene Floss, MD [place], PGY-3

## 2017-12-03 ENCOUNTER — Ambulatory Visit: Payer: Self-pay | Admitting: Internal Medicine

## 2017-12-09 ENCOUNTER — Ambulatory Visit (INDEPENDENT_AMBULATORY_CARE_PROVIDER_SITE_OTHER): Payer: Self-pay | Admitting: Internal Medicine

## 2017-12-09 VITALS — BP 118/60 | HR 90 | Temp 98.1°F | Ht 64.0 in | Wt 295.4 lb

## 2017-12-09 DIAGNOSIS — M5442 Lumbago with sciatica, left side: Secondary | ICD-10-CM

## 2017-12-09 DIAGNOSIS — G8929 Other chronic pain: Secondary | ICD-10-CM

## 2017-12-09 MED ORDER — METHOCARBAMOL 500 MG PO TABS: 500.0000 mg | ORAL_TABLET | Freq: Four times a day (QID) | ORAL | 1 refills | Status: DC | PRN

## 2017-12-09 NOTE — Patient Instructions (Signed)
Ms. Schwarz,  Please try robaxin 500 mg up to 4 times a day as needed. If 500 mg does not make you sleepy, you can take 2 pills at a time.  I have ordered a lower back x-ray. Please have this done at your convenience.  You may have some benefit from stretches to open up your sacroiliac joints.  Please see me back in about 1 month or sooner if having worsening pain.  Ask about setting up an appointment for orange card when you check out.  Best, Dr. Ola Spurr

## 2017-12-14 ENCOUNTER — Encounter: Payer: Self-pay | Admitting: Internal Medicine

## 2017-12-14 DIAGNOSIS — G8929 Other chronic pain: Secondary | ICD-10-CM | POA: Insufficient documentation

## 2017-12-14 DIAGNOSIS — M5442 Lumbago with sciatica, left side: Principal | ICD-10-CM

## 2017-12-14 NOTE — Progress Notes (Signed)
[facility] Progress Note  Subjective:  Carla Hanson is a 52 y.o. female with history of anxiety, bipolar disorder, HTN, hemorrhagic stroke, DVT/PE and obesity who presents for ongoing back pain. Patient last seen for this by me at the end of October. Had a period of time when pain got better but returned for the last 2-3 weeks. Denies inciting injury. She takes care of her grandkids and pain has prevented her from doing this without her mom's help. She has trouble lifting her legs at times but describes the pain mostly as a soreness and tightness but sometimes has sensation of numbness down left thigh. Has been taking tylenol and flexeril without much relief. Has not been able to exercise due to discomfort and has been afraid legs will give way but has not actually fallen. Her orange card has run out. ROS: No fevers, no bowel or bladder incontinence   Allergies  Allergen Reactions  . Latuda [Lurasidone] Other (See Comments)    Made leg muscles twitch (went off this herself in December, 2017)    Social History   Tobacco Use  . Smoking status: Former Smoker    Packs/day: 0.30    Years: 30.00    Pack years: 9.00    Types: Cigarettes    Start date: 11/04/1981    Last attempt to quit: 10/2016    Years since quitting: 1.1  . Smokeless tobacco: Never Used  Substance Use Topics  . Alcohol use: No    Alcohol/week: 0.0 oz    Comment: Quit 10/2016    Objective: Blood pressure 118/60, pulse 90, temperature 98.1 F (36.7 C), temperature source Oral, height 5\' 4"  (1.626 m), weight 295 lb 6.4 oz (134 kg), SpO2 99 %. Body mass index is 50.71 kg/m. Constitutional: Morbidly obese female in NAD Cardiovascular: RRR, S1, S2, no m/r/g.  Pulmonary/Chest: Effort normal and breath sounds normal.  Musculoskeletal: TTP over lumbar paraspinal muscles, over SI joints and midline lumbar spine. Able to flex spine forward and twist to side with mild discomfort.  Neurological: Straight leg  raise does not cause numbness down legs bilaterally and sensation intact across LEs. Strength 5/5 and gait normal.  Skin: Skin is warm and dry. No rash noted.  Psychiatric: Anxious affect.  Vitals reviewed  Lumbar spine xray 04/2009 with intervertebral disc space maintained.   Assessment/Plan: Chronic bilateral low back pain with left-sided sciatica - Acute on chronic. Current flare not brought on by any known injury. Description of pain sounds more like muscle pain but has occasional thigh numbness that could represent sciatica, though could not reproduce on exam. Pain worst over bilateral SI joints but did have mild midline TTP over lumbar spine and tenderness over paraspinal muscles. 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 No recent back imaging.  - Prescribed robaxin 500 mg to take 1-2 tabs as needed for muscle spasm. - Ordered lumbar spine xray but expect delay in imaging, as does not have insurance - Provided handout on SI joint stretches and had patient demonstrate pretzel stretch to open up back - Will need to continue discuss role of weight and options for management  Follow-up in about 1 month to see how pain is doing.  Olene Floss, MD [employer], PGY-3

## 2017-12-14 NOTE — Assessment & Plan Note (Addendum)
-   Acute on chronic. Current flare not brought on by any known injury. Description of pain sounds more like muscle pain but has occasional thigh numbness that could represent sciatica, though could not reproduce on exam. Pain worst over bilateral SI joints but did have mild midline TTP over lumbar spine and tenderness over paraspinal muscles. 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 No recent back imaging.  - Prescribed robaxin 500 mg to take 1-2 tabs as needed for muscle spasm. - Ordered lumbar spine xray but expect delay in imaging, as does not have insurance - Provided handout on SI joint stretches and had patient demonstrate pretzel stretch to open up back - Will need to continue discuss role of weight and options for management

## 2017-12-17 ENCOUNTER — Ambulatory Visit: Payer: Self-pay

## 2017-12-19 ENCOUNTER — Encounter: Payer: Self-pay | Admitting: Family Medicine

## 2017-12-19 NOTE — Progress Notes (Signed)
End of Mood Disorder Clinic - Review  1. Date of last mood disorder clinic visit: 01/08/17  2. Next expected mood disorder clinic visit: 03/05/17 but did not attend follow up   3. PCP: Rogue Bussing, MD  4. Current psychiatric medications (list): none - canceled prescriptions written by Dr. Tammi Klippel (yes/no): no, patient was not on any psychiatric medications at the time of last mood disorder clinic appt - re-prescribed existing prescriptions (yes/no): no, n/a  5. Recommend psychiatry referral or continued medication management at the [facility]? Continued management at the [facility] (though not on any medications)   6. Office visit follow up: unclear to me that she needs office visit for follow up of [place] issues as she was not on any psychiatric medications at time of last [facility] appointment in March 2018.  Will route to Drs. Tomasita Morrow for review.  Chrisandra Netters, MD [place]

## 2017-12-24 ENCOUNTER — Other Ambulatory Visit: Payer: Self-pay | Admitting: Internal Medicine

## 2017-12-24 NOTE — Telephone Encounter (Signed)
Pt would like Rx for cyclobenzaprine sent to [facility] Dept.  The other muscle relaxer isnt working.

## 2017-12-26 NOTE — Telephone Encounter (Signed)
Called in prescription for flexeril 10 mg #30 with instructions to take 0.5-1 tablet up to three times daily as needed for muscle spasm.

## 2018-01-06 ENCOUNTER — Ambulatory Visit (INDEPENDENT_AMBULATORY_CARE_PROVIDER_SITE_OTHER): Payer: Self-pay | Admitting: Family Medicine

## 2018-01-06 ENCOUNTER — Ambulatory Visit: Payer: Self-pay | Admitting: Family Medicine

## 2018-01-06 VITALS — BP 140/82 | HR 73 | Temp 98.8°F | Ht 64.0 in | Wt 290.0 lb

## 2018-01-06 DIAGNOSIS — M25511 Pain in right shoulder: Secondary | ICD-10-CM

## 2018-01-06 MED ORDER — PREDNISONE 10 MG (21) PO TBPK
ORAL_TABLET | ORAL | 0 refills | Status: DC
Start: 2018-01-06 — End: 2018-02-03

## 2018-01-06 MED ORDER — KETOROLAC TROMETHAMINE 30 MG/ML IJ SOLN
30.0000 mg | Freq: Once | INTRAMUSCULAR | Status: AC
  Administered 2018-01-06: 30 mg via INTRAMUSCULAR

## 2018-01-06 NOTE — Progress Notes (Signed)
toradol    Subjective:    Patient ID: Carla Hanson, female    DOB: 05/05/1966, 52 y.o.   MRN: [id number]   CC: right shoulder pain  HPI: "agonizing pain across trap muscle", could not sleep last night. Has been going on several days. Nothing makes it better. Has tried robaxin but this just made her sleepy. Flexeril also makes her sleepy does not help much with pain. Moving makes it worse. She states she normally has lower back spasms but never has had spasm this high up her back. She takes tylenol without much relief. She does not take ibuprofen because it contains pork products.   Smoking status reviewed- former smoker  Review of Systems- no fevers, chills, injury or trauma to area   Objective:  BP 140/82 (BP Location: Right Arm)   Pulse 73   Temp 98.8 F (37.1 C) (Oral)   Ht 5\' 4"  (1.626 m)   Wt 290 lb (131.5 kg)   SpO2 95%   BMI 49.78 kg/m  Vitals and nursing note reviewed  General: obese woman, well nourished, in no acute distress Cardiac: RRR, clear S1 and S2, no murmurs, rubs, or gallops Respiratory: clear to auscultation bilaterally, no increased work of breathing Back: tender to palpation over left upper thoracic musculature Skin: warm and dry, no rashes noted Neuro: alert and oriented, no focal deficits   Assessment & Plan:   1. Acute pain of right shoulder Patient has appointment with PCP tomorrow to discuss plans for managing low back pain, encouraged her to keep this appointment. In the meantime, will give toradol in office and steroid dose pack to help with acute pain. Offered PT, which would likely help patient regain some functionality over long term, and she said she would think about it and discuss with PCP tomorrow at her visit. - ketorolac (TORADOL) 30 MG/ML injection 30 mg   Return if symptoms worsen or fail to improve.   Lucila Maine, DO Family Medicine Resident PGY-2

## 2018-01-06 NOTE — Patient Instructions (Signed)
   It was nice to meet you today. Sorry you are having so much pain.  Please take the steroids as prescribed and follow up with your PCP tomorrow to discuss further options. PT could be a good option for you to help with your pain.  If you have questions or concerns please do not hesitate to call at [phone].  Lucila Maine, DO PGY-2, [facility] 01/06/2018 2:25 PM

## 2018-01-07 ENCOUNTER — Ambulatory Visit: Payer: Self-pay | Admitting: Internal Medicine

## 2018-01-07 ENCOUNTER — Telehealth: Payer: Self-pay | Admitting: Internal Medicine

## 2018-01-07 ENCOUNTER — Ambulatory Visit (HOSPITAL_COMMUNITY)
Admission: RE | Admit: 2018-01-07 | Discharge: 2018-01-07 | Disposition: A | Discharge status: Home / Self Care | Payer: Self-pay | Source: Ambulatory Visit | Attending: Family Medicine | Admitting: Family Medicine

## 2018-01-07 DIAGNOSIS — M47816 Spondylosis without myelopathy or radiculopathy, lumbar region: Secondary | ICD-10-CM | POA: Insufficient documentation

## 2018-01-07 DIAGNOSIS — G8929 Other chronic pain: Secondary | ICD-10-CM | POA: Insufficient documentation

## 2018-01-07 DIAGNOSIS — M5442 Lumbago with sciatica, left side: Secondary | ICD-10-CM | POA: Insufficient documentation

## 2018-01-07 NOTE — Telephone Encounter (Signed)
Pt is calling to let the doctor know that she is on the way to do her back x-ray. She would like for the doctor call her once the results come in and she canceled today's appointment and will come back after the doctor reviews her x-rays. jw

## 2018-01-08 NOTE — Telephone Encounter (Signed)
Left message for patient about x-ray results. Suggested trying a steroid taper to help with inflammation. Asked patient to call back if she would like me to order this for her. Physical therapy would be beneficial; asked patient to clarify her current insurance status to know if this would be feasible.  Olene Floss, MD [place], PGY-3

## 2018-01-19 ENCOUNTER — Ambulatory Visit: Payer: Self-pay | Admitting: Internal Medicine

## 2018-01-21 ENCOUNTER — Encounter: Payer: Self-pay | Admitting: Family Medicine

## 2018-01-21 ENCOUNTER — Ambulatory Visit (INDEPENDENT_AMBULATORY_CARE_PROVIDER_SITE_OTHER): Payer: Self-pay | Admitting: Family Medicine

## 2018-01-21 VITALS — BP 112/80 | HR 82 | Temp 98.4°F | Ht 64.0 in | Wt 294.4 lb

## 2018-01-21 DIAGNOSIS — G8929 Other chronic pain: Secondary | ICD-10-CM | POA: Insufficient documentation

## 2018-01-21 DIAGNOSIS — M25511 Pain in right shoulder: Secondary | ICD-10-CM

## 2018-01-21 IMAGING — MR MR MRV HEAD W/O CM
10 of 14 series · 27 of 48 positions shown · IV contrast (agent unspecified)
Comparison: Head CT 1990 hours today.

CONTRAST:  20mL MULTIHANCE GADOBENATE DIMEGLUMINE 529 MG/ML IV SOLN

ADDENDUM:
Study discussed by telephone with Dr. Tewodros Middleton 11/08/2016
at 4485 hours.
We reviewed the patient medical record and laboratory studies which
do not reveal evidence of dehydration. And also the patient is
currently on Xarelto - which argues against the presence of acute
venous thrombosis.

He inquired about septic emboli, which I doubt given the absence of
other acute foci in the brain.
I mentioned RCVS, but that diagnosis is not supposed to include
parenchymal hemorrhage.
I also advised the I see no evidence of an underlying AVM. Further,
a left occipital AVM would not explain the apparent acute right
vertex subarachnoid hemorrhage.
Dr. Delrio then inquired about the possibility of
Coup-contrecoup injury (i.e. with a left lateral occipital
hemorrhagic contusion and contralateral trace right vertex
subarachnoid hemorrhage), and although cerebral contusions are
generally frontal or temporal that diagnosis would explain the
bilateral acute findings.
CLINICAL DATA: 50-year-old female recently admitted with pneumonia,
but following discharge began having abnormal speech and complaining
of visual changes. Found have acute left occipital lobe hemorrhage
on noncontrast head CT today. Initial encounter.
EXAM:
MRI HEAD WITHOUT AND WITH CONTRAST
MRV HEAD WITHOUT CONTRAST
TECHNIQUE: Multiplanar, multiecho pulse sequences of the brain and surrounding
structures were obtained without and with intravenous contrast.
Angiographic images of the intracranial venous structures were
obtained using MRV technique without intravenous contrast.

[Series 3: DWI · axial · 3.0mm · 1.02mm/px · z∈[-93,+38]mm · 6 of 100 slices shown (1 of 2)]
[im 1/100]
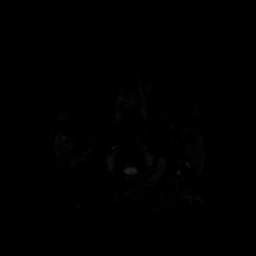
[im 20/100]
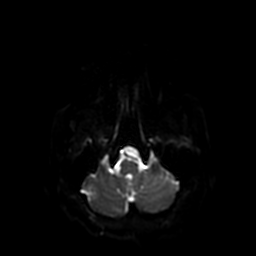
[im 40/100]
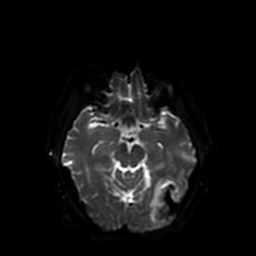
[im 60/100]
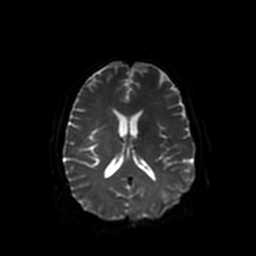
[im 80/100]
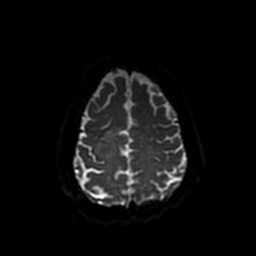
[im 100/100]
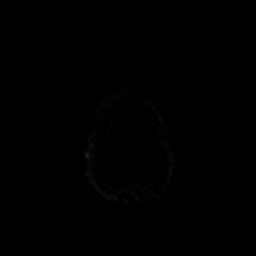

[Series 4: DWI · coronal · 5.0mm · 1.02mm/px · 5 of 70 slices shown (2 of 2)]
[im 1/70]
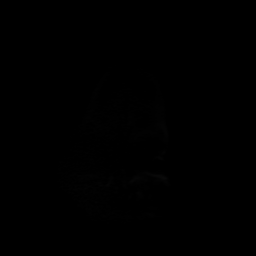
[im 18/70]
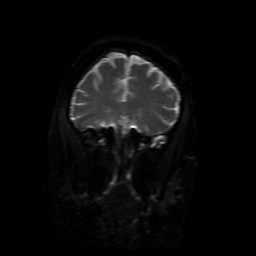
[im 35/70]
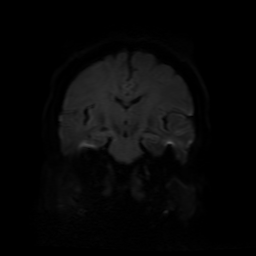
[im 52/70]
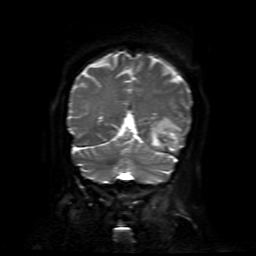
[im 70/70]
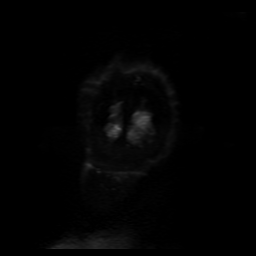

[Series 5: FLAIR · sagittal · 5.0mm · 0.47mm/px · 2 of 24 slices shown (1 of 3)]
[im 1/24]
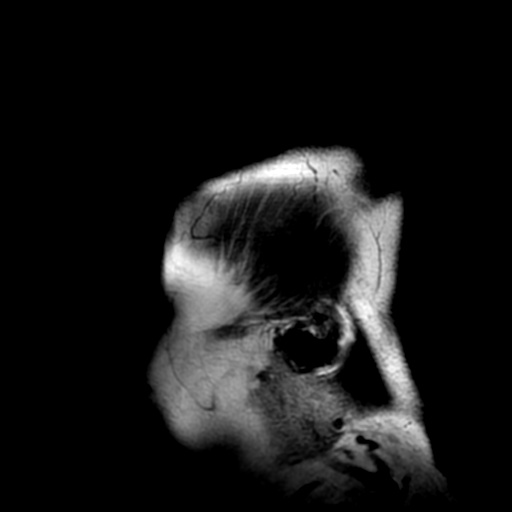
[im 24/24]
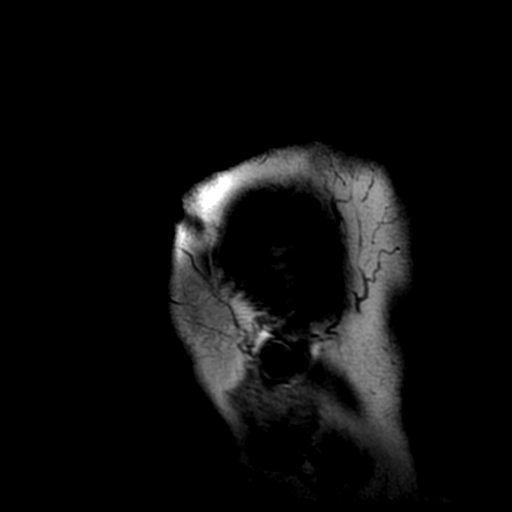

[Series 6: T2 · axial · 5.0mm · 0.47mm/px · z∈[-85,+43]mm · 2 of 25 slices shown (1 of 2)]
[im 1/25]
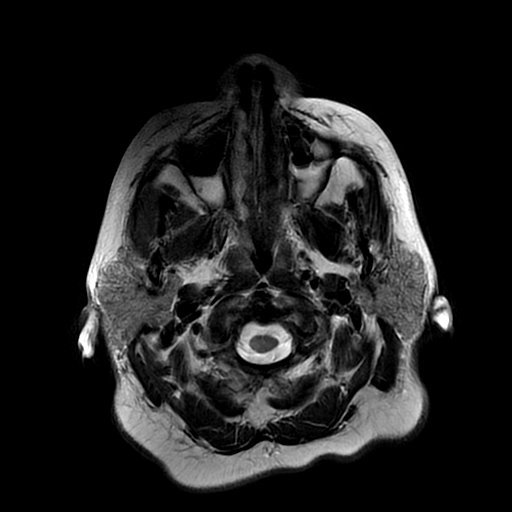
[im 25/25]
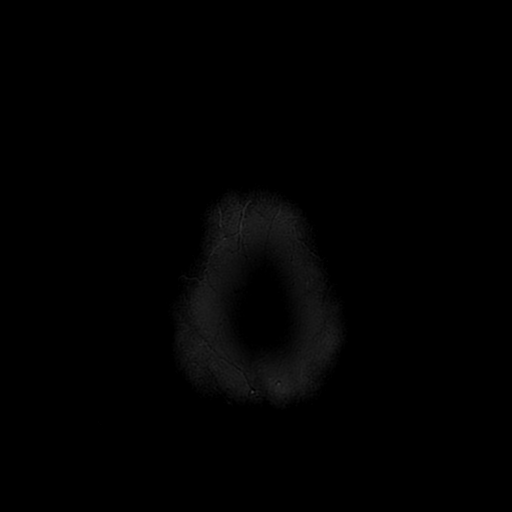

[Series 7: FLAIR · axial · 5.0mm · 0.47mm/px · z∈[-85,+43]mm · 2 of 25 slices shown (2 of 3)]
[im 1/25]
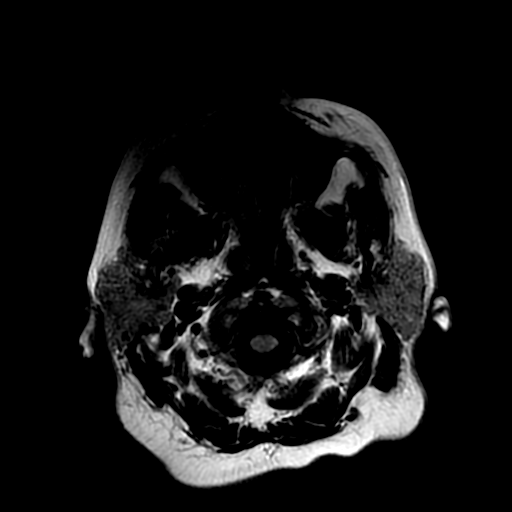
[im 25/25]
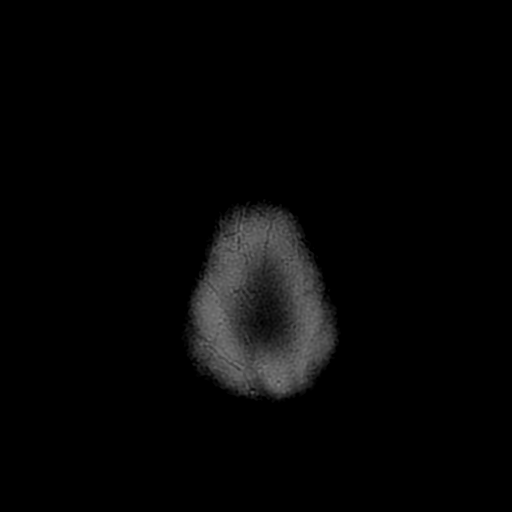

[Series 11: T2 · coronal · 5.0mm · 0.39mm/px · 1 of 18 slices shown (2 of 2)]
[im 1/18]
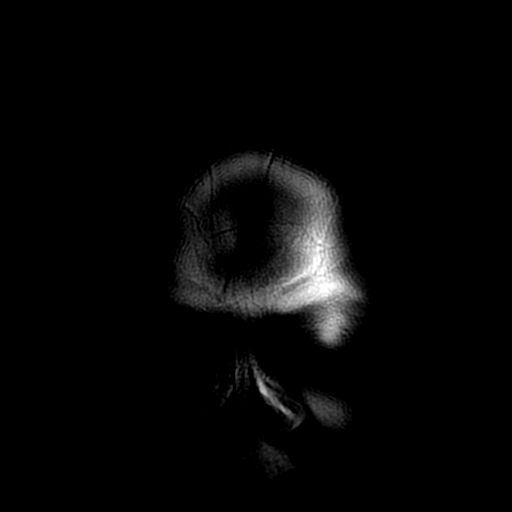

[Series 13: T1 · coronal · 5.0mm · 0.39mm/px · 2 of 35 slices shown]
[im 1/35]
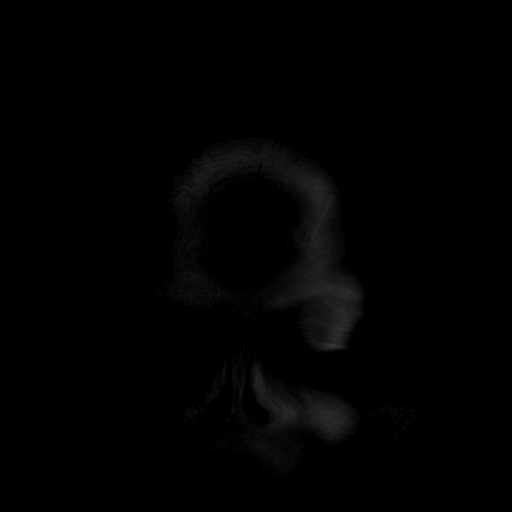
[im 35/35]
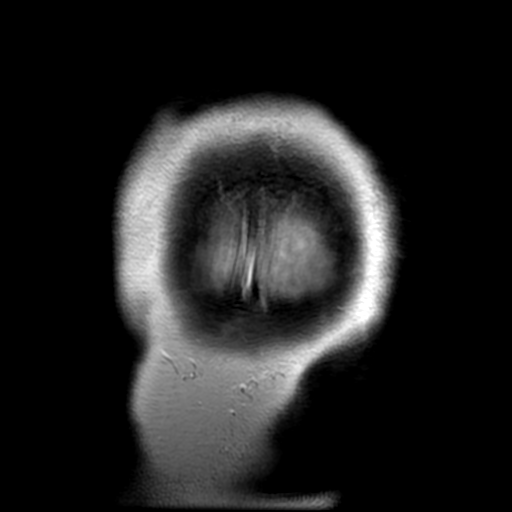

[Series 14: FLAIR · sagittal · 5.0mm · 0.47mm/px · 2 of 24 slices shown (3 of 3)]
[im 1/24]
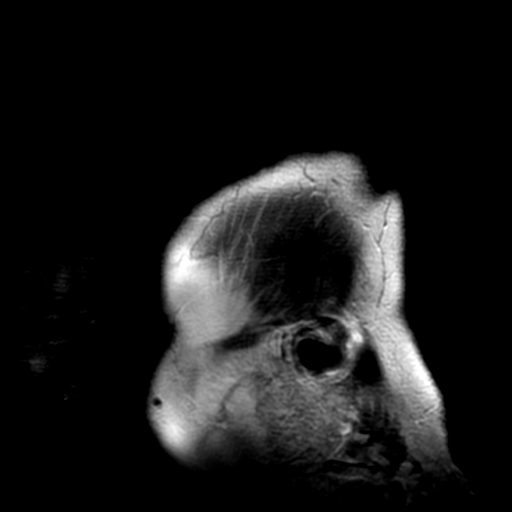
[im 24/24]
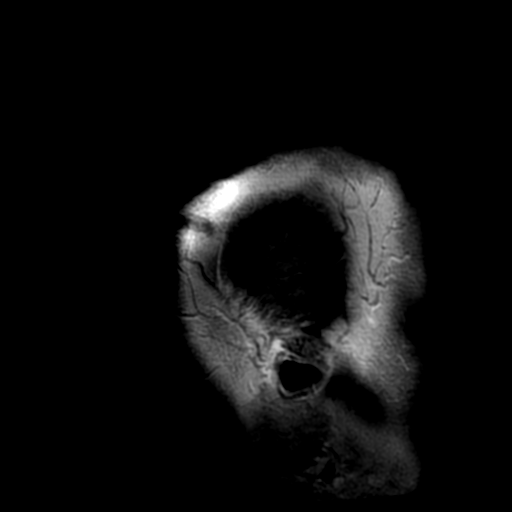

[Series 350: ADC · axial · 3.0mm · 1.02mm/px · z∈[-93,+38]mm · 3 of 50 slices shown (1 of 2)]
[im 1/50]
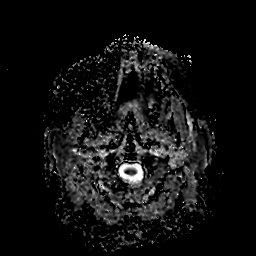
[im 25/50]
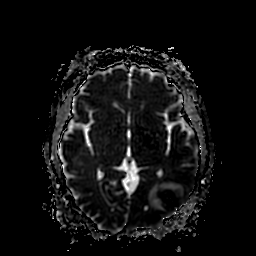
[im 50/50]
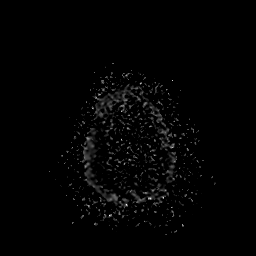

[Series 450: ADC · coronal · 5.0mm · 1.02mm/px · 2 of 35 slices shown (2 of 2)]
[im 1/35]
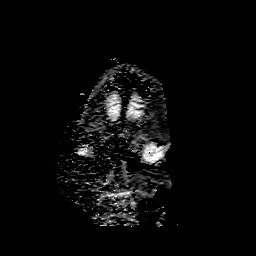
[im 35/35]
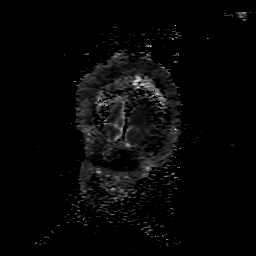

[27 of 48 positions shown; findings below may reference images not displayed]

FINDINGS: MR HEAD FINDINGS

Brain: Trefoil area of isointense T1 and hypointense T2 hemorrhage
along the lateral aspect of the left occipital lobe with surrounding
T2 and FLAIR hyperintense edema. Diffusion-weighted imaging shows
mostly susceptibility artifact from the blood products, but there is
evidence of some peripheral restricted diffusion along the area,
especially the anterior superior margin (series 3, image 27)
following contrast no suspicious enhancement. Mild regional mass
effect. No midline shift.

No other areas of restricted diffusion.

There are subtle subarachnoid blood products at the right vertex
corresponding to the area identified by CT. These are seen best on
susceptibility imaging (series 9, image 86). These track inferiorly
along the right convexity. No associated mass effect.

No areas of chronic hemorrhage identified elsewhere. No
intraventricular hemorrhage, ventriculomegaly. Normal basilar
cisterns. Partially empty sella. Cervicomedullary junction within
normal limits.

Elsewhere gray and white matter signal is within normal limits. No
cortical encephalomalacia. No abnormal parenchymal enhancement.

Vascular: Major intracranial vascular flow voids are preserved.

Skull and upper cervical spine: Negative.

Sinuses/Orbits: Orbits soft tissues appear within normal limits.

Small fluid levels in the ethmoid, maxillary, and left sphenoid
sinuses.

Other: Visible internal auditory structures appear normal. Mastoids
are clear. Negative scalp soft tissues.

MRV HEAD FINDINGS

The post-contrast images on the above MRI demonstrate preserved
enhancement of the dural venous sinuses. Likewise, there is
preserved flow signal on time-of-flight MRV images within the
superior sagittal sinus, the torcula, the straight sinus, the vein
of Gedzgelevskiy, and the internal cerebral veins.

The right transverse sinus appears to be dominant. There is
preserved flow signal in the right transverse, sigmoid sinuses and
right IJ bulb.

The left transverse sinus appears non dominant but does demonstrate
preserved flow signal which continues into the left sigmoid sinus
and left IJ bulb. In the area of acute hemorrhage and edema there
are small cortical veins which demonstrate preserved flow signal.
However, on both the axial postcontrast images and the MRV source
images there is a conspicuous filling defect of the adjacent sigmoid
sinus near the hemorrhage (series 12, image 17 and series 8, image
40) although this might simply reflect an arachnoid granulation.

There is preserved flow signal in what appear to be the bilateral
veins of Labbe.
IMPRESSION: 1. Stable trefoil focus of hemorrhage in the lateral left occipital
lobe with surrounding edema and mild regional mass effect. There is
possibly mild parenchymal infarct along the margins of the
hemorrhage.
Superimposed Small focus of right vertex subarachnoid hemorrhages
confirmed but lacks associated edema or mass effect.
2. Although this study is negative for underlying dural sinus
thrombosis, it is possible that #1 are the sequelae of thrombosis of
small cortical veins - especially if the patient has been dehydrated
or has an underlying coagulopathy.
3. No underlying chronic cerebral blood products to suggest a
chronic angiopathy. No prior infarcts identified.

## 2018-01-21 MED ORDER — CYCLOBENZAPRINE HCL 10 MG PO TABS: 10.0000 mg | ORAL_TABLET | Freq: Three times a day (TID) | ORAL | 0 refills | Status: DC | PRN

## 2018-01-21 MED ORDER — LIDOCAINE 5 % EX OINT: 1.0000 "application " | TOPICAL_OINTMENT | CUTANEOUS | 0 refills | Status: DC | PRN

## 2018-01-21 NOTE — Patient Instructions (Addendum)
It was great meeting you today! I am sorry that you have been having so much pain in your right shoulder! I think there are a number of therapies we can try. We discussed more aggressive therapies such as long acting injections at the point that hurts the most. We opted to do a more conservative strategy today. I think it will be ok to increase your flexeril to 10mg  three times per day as needed. I gave you a refill of this. I also gave you a prescription of a lidocaine ointment that you can apply up to 4 times per day. People seem to get the best results from applying it when they wake up in the morning and when they go to sleep at night and 1 or 2 times throughout the day.  If you are still having issues with your shoulder please come back and see [place] in 1-2 weeks. At that time we can consider an injection. We can also think about getting an xray of that shoulder.  Applying a warm towel and heating pad will also be very helpful. You can also try various stretches to help with the range of motion in this area.

## 2018-01-23 ENCOUNTER — Encounter: Payer: Self-pay | Admitting: Family Medicine

## 2018-01-23 NOTE — Assessment & Plan Note (Signed)
Patient with right shoulder pain that seems to originate from point on right trapezius. Would likely benefit trigger point injection. Patient opted for more conservative therapy. Gave lidocaine ointment. Can apply qid. Also refilled script for flexeril. Gave return precautions for 2-3 weeks if pain not resolved, can consider trigger point injection at that visit. Can also consider shoulder xray, although this pain does not seem to be in the joint. - lidocaine ointment - tylenol for pain - flexeril prn

## 2018-01-23 NOTE — Progress Notes (Signed)
   HPI 52 year old female who presents as a same day appointment for shoulder pain. She was recently seen on 3/5 and was treated with a toradol injection in addition to her already present flexeril dose and nsaid. She states that the toradol helped for about one day and then stopped working. It is causing her significant limitation in the range of motion in that shoulder. She can only raise it to about 90 degrees. She has tried stretching.  The pain seems to be limited to one area of significant tenderness in her right trapezius and seems to radiate out from that area. It is with motion that she has this tenderness. She has not really tried stretching or any over the counter medications.   CC: right shoulder pain   ROS:   Review of Systems See HPI for ROS.   CC, SH/smoking status, and VS noted  Objective: BP 112/80   Pulse 82   Temp 98.4 F (36.9 C) (Oral)   Ht 5\' 4"  (1.626 m)   Wt 294 lb 6.4 oz (133.5 kg)   SpO2 97%   BMI 50.53 kg/m  Gen: NAD, alert, cooperative, and pleasant. Obese african [place] female.  HEENT: NCAT, EOMI, PERRL CV: RRR, no murmur Resp: CTAB, no wheezes, non-labored Abd: SNTND, BS present, no guarding or organomegaly Ext: No edema, warm Neuro: Alert and oriented, Speech clear, No gross deficits Extremity: able to raise right shoulder to 90 degrees and even more with a lot of effort. Painful tenderness to palpation right point on right trapezius.    Assessment and plan:  Chronic right shoulder pain Patient with right shoulder pain that seems to originate from point on right trapezius. Would likely benefit trigger point injection. Patient opted for more conservative therapy. Gave lidocaine ointment. Can apply qid. Also refilled script for flexeril. Gave return precautions for 2-3 weeks if pain not resolved, can consider trigger point injection at that visit. Can also consider shoulder xray, although this pain does not seem to be in the joint. - lidocaine  ointment - tylenol for pain - flexeril prn   No orders of the defined types were placed in this encounter.   Meds ordered this encounter  Medications  . cyclobenzaprine (FLEXERIL) 10 MG tablet    Sig: Take 1 tablet (10 mg total) by mouth 3 (three) times daily as needed for muscle spasms.    Dispense:  30 tablet    Refill:  0  . lidocaine (XYLOCAINE) 5 % ointment    Sig: Apply 1 application topically as needed.    Dispense:  50 g    Refill:  0     Guadalupe [name] MD PGY-1 Family Medicine Resident 01/23/2018 9:32 AM

## 2018-01-24 IMAGING — MR MR HEAD W/ CM
3 of 4 series · 19 of 48 positions shown · IV contrast (20 mh)
Comparison: Initial CT head 11/08/2016.

CLINICAL DATA: Continued surveillance of intracranial hemorrhage.
Possible septic emboli.

EXAM:
MRI HEAD WITH CONTRAST
TECHNIQUE: Multiplanar, multiecho pulse sequences of the brain and surrounding
structures were obtained with intravenous contrast.
CONTRAST:  20 mL MultiHance.

[Series 4: T1 · axial · 5.0mm · 0.47mm/px · z∈[-110,+14]mm · 6 of 24 slices shown (1 of 3)]
[im 1/24]
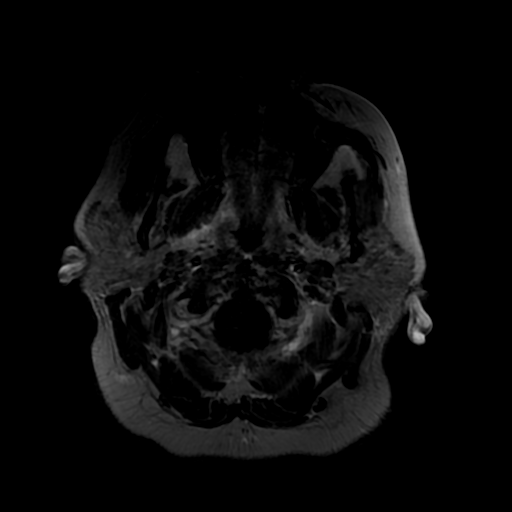
[im 5/24]
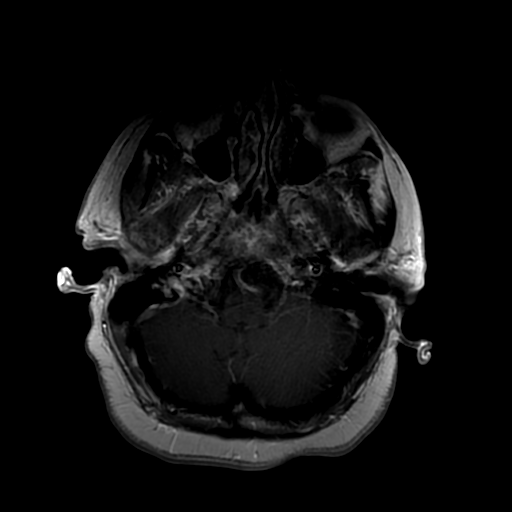
[im 10/24]
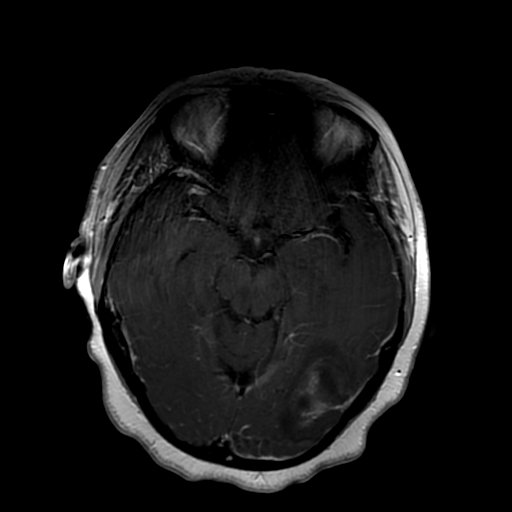
[im 14/24]
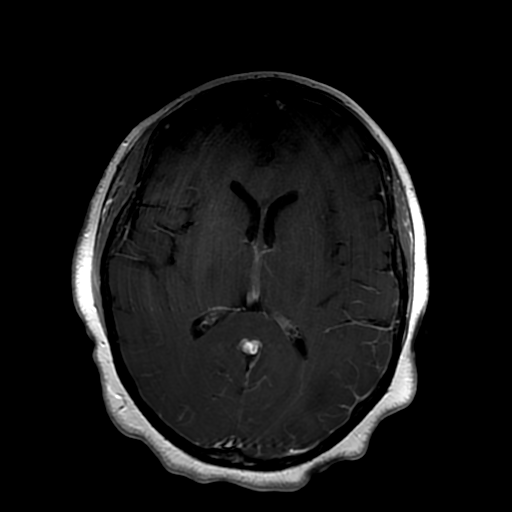
[im 19/24]
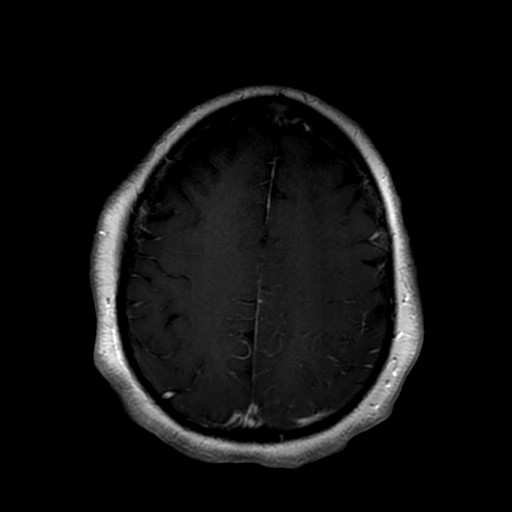
[im 24/24]
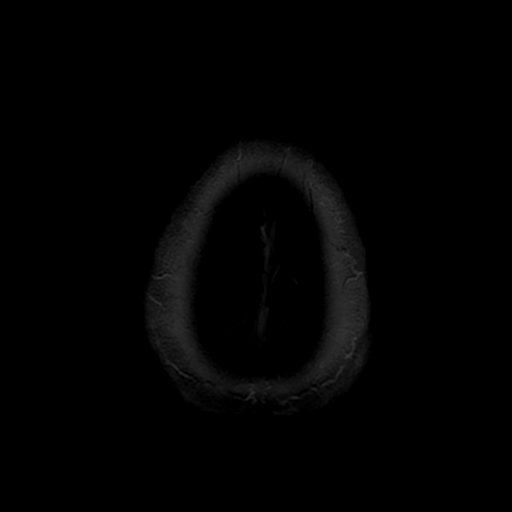

[Series 5: T1 · coronal · 5.0mm · 0.47mm/px · 7 of 27 slices shown (2 of 3)]
[im 1/27]
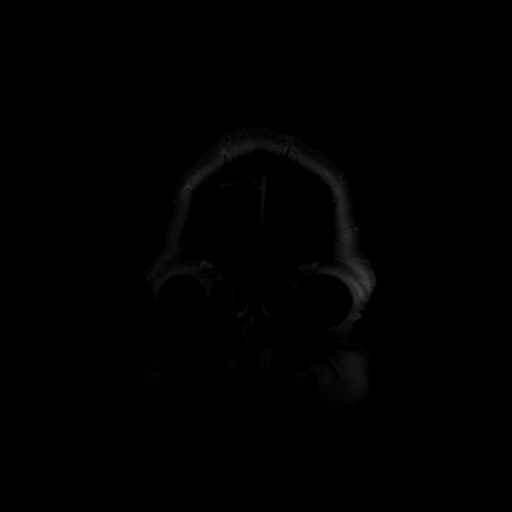
[im 5/27]
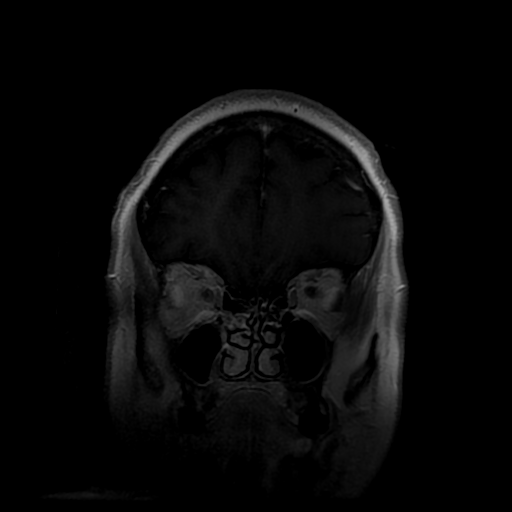
[im 9/27]
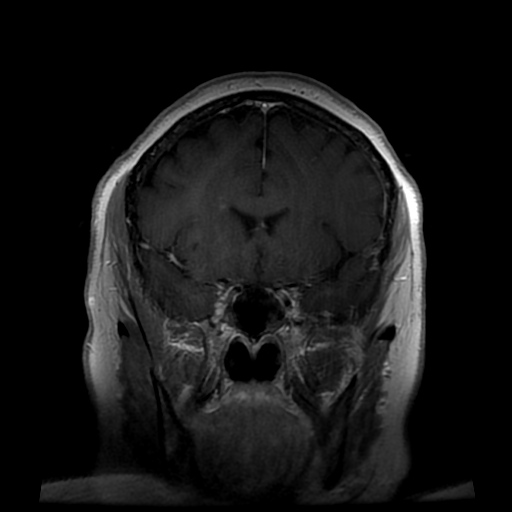
[im 14/27]
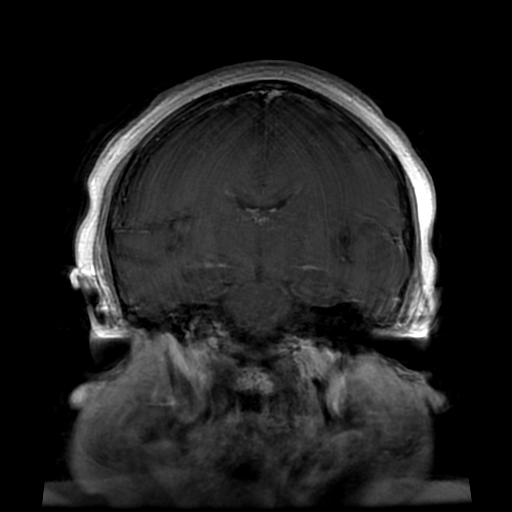
[im 18/27]
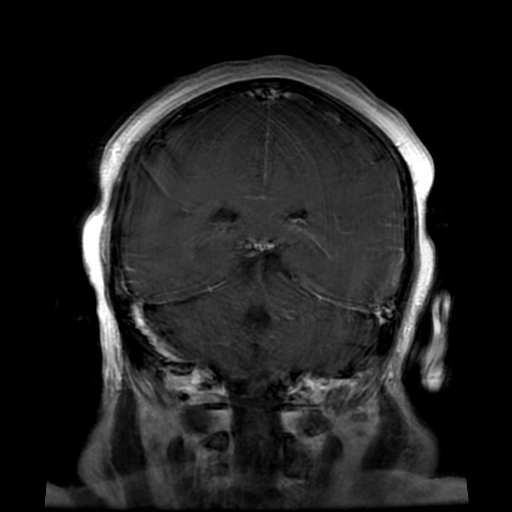
[im 22/27]
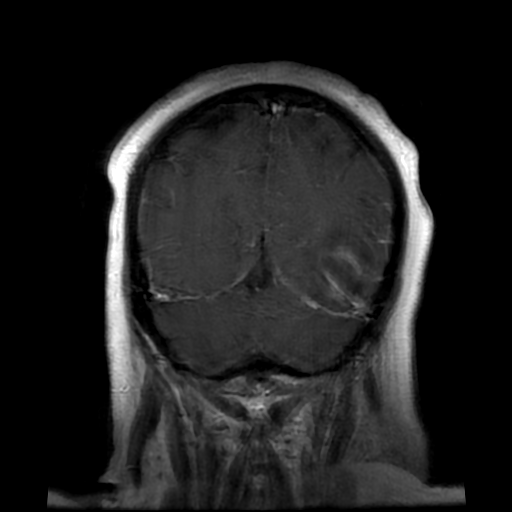
[im 27/27]
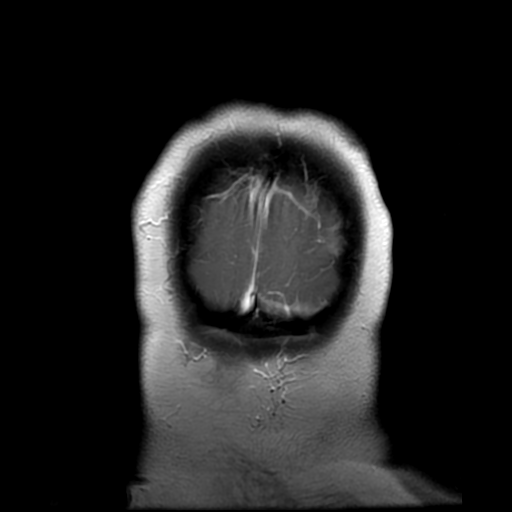

[Series 6: T1 · sagittal · 5.0mm · 0.47mm/px · 6 of 23 slices shown (3 of 3)]
[im 1/23]
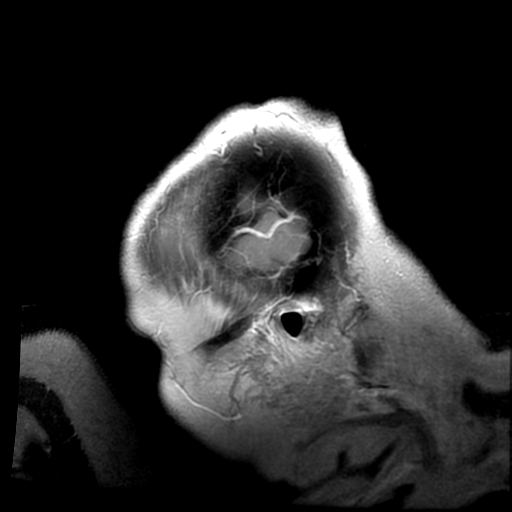
[im 5/23]
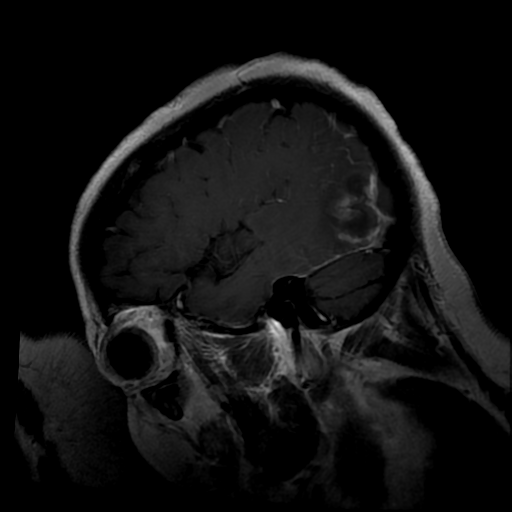
[im 9/23]
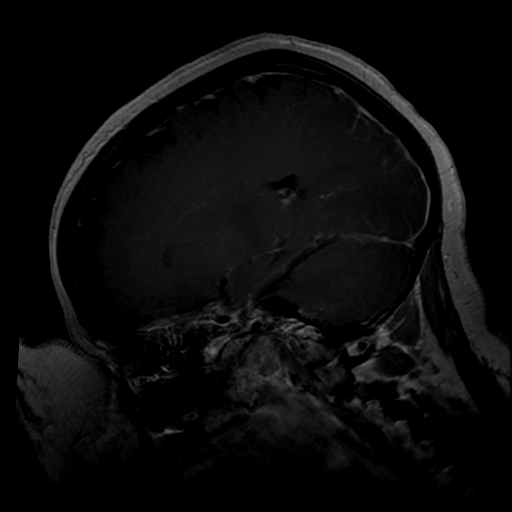
[im 14/23]
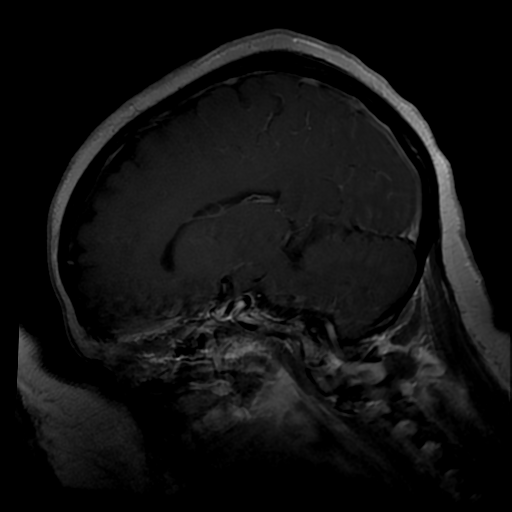
[im 18/23]
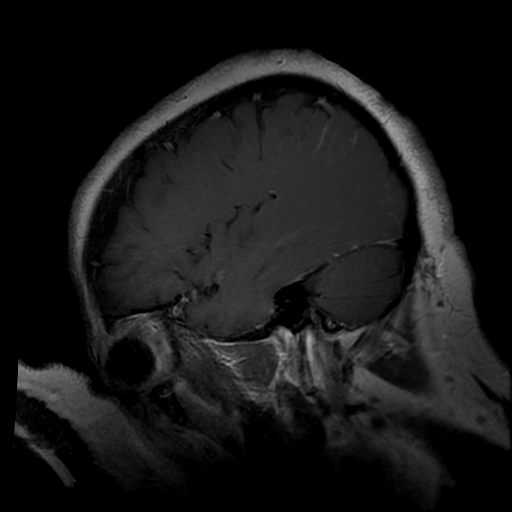
[im 23/23]
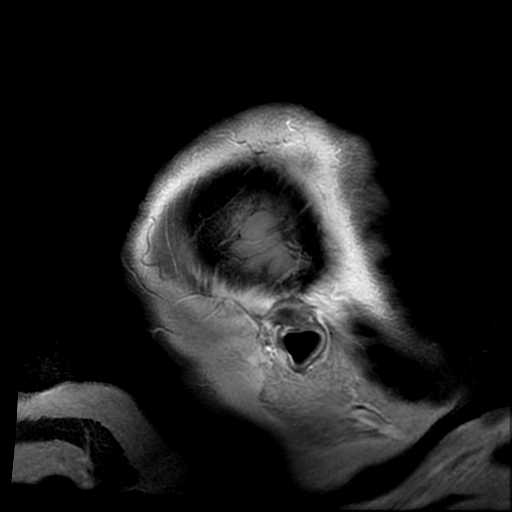

[19 of 48 positions shown; findings below may reference images not displayed]

MR head without and with
contrast 11/08/2016. MRV 11/08/2016. CTA head neck 11/09/2016.
FINDINGS: Precontrast T1 weighted imaging was not performed; therefore, it is
not possible to distinguish between methemoglobin conversion of
hemorrhage versus postcontrast enhancement.

Brain: 41 x 38 x 36 mm LEFT lateral occipital contusion/hematoma,
with primarily central greater than peripheral intrinsic T1
shortening and/or postcontrast enhancement. No other areas of
abnormal T1 shortening/enhancement are observed.

Vascular: Flow voids are maintained. Major dural venous sinuses
appear patent.

Skull and upper cervical spine: Unchanged and negative.

Sinuses/Orbits: Negative.

Other: None.

Compared with prior study 3 days ago, the lesion did not display
significant T1 shortening.
IMPRESSION: Intrinsic T1 shortening and/or postcontrast enhancement of the LEFT
lateral occipital contusion/hematoma. No other areas of abnormal T1
shortening or enhancement are observed.

## 2018-02-03 ENCOUNTER — Ambulatory Visit (INDEPENDENT_AMBULATORY_CARE_PROVIDER_SITE_OTHER): Payer: Self-pay | Admitting: Family Medicine

## 2018-02-03 DIAGNOSIS — Z6841 Body Mass Index (BMI) 40.0 and over, adult: Secondary | ICD-10-CM

## 2018-02-03 DIAGNOSIS — I10 Essential (primary) hypertension: Secondary | ICD-10-CM

## 2018-02-03 NOTE — Patient Instructions (Addendum)
-  When you use peanut butter (or other condiment that are mostly or all fat), measure what you use.    - Spread the pb out on your banana (or whatever) before you start to eat.  PLATE all you eat.    Goals: 1. Limit [place] bacon to one slice per meal.  Include a piece of fruit for breakfast.  (Check each breakfast that has no more than 1 slice bacon PLUS some fruit.) 2. Eliminate soda and sweet tea.  Substitute unsweet tea with lemon.  (Check each day you have no sweet drinks.) 3. Do a walking or tai chi video at least 4 X wk.  (Track # minutes / steps.)  - Complete GOALS SHEET, and bring to follow-up appt.    - Talk with Dr. Ola Spurr about a PT appt for your shoulder and back.

## 2018-02-03 NOTE — Progress Notes (Signed)
Medical Nutrition Therapy:  Appt start time: 0768 end time:  1630.  Assessment:  Primary concerns today: Weight management and Blood sugar control.  Carla Hanson had been doing very well with her eating and exercise until about three months ago when she started having various orthopedic problems - knee, back, then shoulder.  She is just starting to get back to baseline, and has tried out a video of a tai chi class.      [name] watches her 3-year and 6-mo old grandchildren most days of the week, when her daughter works, 4:30 AM to 11:30 AM or 7:30 AM to 5 PM or 6 AM to 1:30 PM.  She always feeds the children, but often skips her own meal at that time.  She admits that she has regularly included sweet drinks and foods in her diet in recent months.  Sounds like she has struggled with mood and motivation, but she also sounds more optimistic at this point.  I assured [name] that it is normal and understandable to look to food for comfort, especially when dealing with physical pain as she has been.    24-hr recall:  (Up at 8:30 AM) B (9:30 AM)-  16 oz soda, 2 scrmbld eggs, 2 slc [place] bacon, 1/2 avocado, water Snk ( AM)-  water L ( PM)-  --- Snk (2 PM)-  1 tbsp peanut butter, banana, 16 oz soda, water D ( PM)-  5 fried chx tenders, 1 c fries, 20 oz sweet tea Snk ( PM)-  1 Coke float (2/3 c ice cream, 4-6 oz cola), water Typical day? Yes.  Has not been doing much cooking; daughter brings take-out or she has been going out.    Learning Readiness: Change in progress  Recent physical activity includes none.     Progress Towards Goal(s):  In progress.   Nutritional Diagnosis:  No progress noted on [place]-3.3 Overweight/obesity As related to energy balance.  As evidenced by BMI >50.    Intervention:  Nutrition education  Handouts given during visit include:  AVS  Goals Sheet (revised)  Demonstrated degree of understanding via:  Teach Back  Barriers to learning/adherence to lifestyle change: Recent  behavioral setback due to orthopedic pain and lost motivation.    Monitoring/Evaluation:  Dietary intake, exercise, and body weight in 7 week(s).

## 2018-02-09 ENCOUNTER — Encounter: Payer: Self-pay | Admitting: Internal Medicine

## 2018-02-09 ENCOUNTER — Ambulatory Visit (INDEPENDENT_AMBULATORY_CARE_PROVIDER_SITE_OTHER): Payer: Self-pay | Admitting: Internal Medicine

## 2018-02-09 VITALS — BP 110/80 | HR 86 | Temp 98.7°F | Ht 64.0 in | Wt 294.6 lb

## 2018-02-09 DIAGNOSIS — R7303 Prediabetes: Secondary | ICD-10-CM

## 2018-02-09 DIAGNOSIS — Z6841 Body Mass Index (BMI) 40.0 and over, adult: Secondary | ICD-10-CM

## 2018-02-09 DIAGNOSIS — M25511 Pain in right shoulder: Secondary | ICD-10-CM

## 2018-02-09 DIAGNOSIS — I10 Essential (primary) hypertension: Secondary | ICD-10-CM

## 2018-02-09 DIAGNOSIS — D649 Anemia, unspecified: Secondary | ICD-10-CM

## 2018-02-09 DIAGNOSIS — G8929 Other chronic pain: Secondary | ICD-10-CM

## 2018-02-09 DIAGNOSIS — M549 Dorsalgia, unspecified: Secondary | ICD-10-CM

## 2018-02-09 LAB — POCT GLYCOSYLATED HEMOGLOBIN (HGB A1C): Hemoglobin A1C: 5.6

## 2018-02-09 MED ORDER — LOSARTAN POTASSIUM-HCTZ 50-12.5 MG PO TABS: 1.0000 | ORAL_TABLET | Freq: Every day | ORAL | 2 refills | Status: DC

## 2018-02-09 MED ORDER — LOSARTAN POTASSIUM-HCTZ 50-12.5 MG PO TABS: 1.0000 | ORAL_TABLET | Freq: Every day | ORAL | 5 refills | Status: DC

## 2018-02-09 NOTE — Patient Instructions (Signed)
Ms. Humphres,  I will refer you to physical therapy. If you do not feel you've had some improvement over the next few weeks, that's when I would consider trigger point injection.  Keep up the good work with avoiding soda!  I will call with lab results regarding anemia.  Best, Dr. Ola Spurr

## 2018-02-10 LAB — CBC
Hematocrit: 33 % — ABNORMAL LOW (ref 34.0–46.6)
Hemoglobin: 10.5 g/dL — ABNORMAL LOW (ref 11.1–15.9)
MCH: 23.2 pg — AB (ref 26.6–33.0)
MCHC: 31.8 g/dL (ref 31.5–35.7)
MCV: 73 fL — AB (ref 79–97)
PLATELETS: 413 10*3/uL — AB (ref 150–379)
RBC: 4.52 x10E6/uL (ref 3.77–5.28)
RDW: 16.5 % — AB (ref 12.3–15.4)
WBC: 8.5 10*3/uL (ref 3.4–10.8)

## 2018-02-10 LAB — FERRITIN: FERRITIN: 13 ng/mL — AB (ref 15–150)

## 2018-02-11 ENCOUNTER — Encounter: Payer: Self-pay | Admitting: Internal Medicine

## 2018-02-11 DIAGNOSIS — D649 Anemia, unspecified: Secondary | ICD-10-CM | POA: Insufficient documentation

## 2018-02-11 DIAGNOSIS — S22000A Wedge compression fracture of unspecified thoracic vertebra, initial encounter for closed fracture: Secondary | ICD-10-CM | POA: Insufficient documentation

## 2018-02-11 DIAGNOSIS — M549 Dorsalgia, unspecified: Secondary | ICD-10-CM | POA: Insufficient documentation

## 2018-02-11 DIAGNOSIS — M545 Low back pain, unspecified: Secondary | ICD-10-CM | POA: Insufficient documentation

## 2018-02-11 NOTE — Assessment & Plan Note (Signed)
-   Continues to restrict patient's activities due to discomfort. No signs of frozen shoulder on physical exam with normal ROM with internal and external rotation of shoulder. Appears to have trapezius muscle spasm and likely shoulder strain given pain with shoulder abduction.  - Will refer to physical therapy. - Could consider trigger point injection. - Continue prn flexeril. Can try OTC capsaicin.

## 2018-02-11 NOTE — Progress Notes (Signed)
[facility] Progress Note  Subjective:  Carla Hanson is a 52 y.o. female with anxiety,bipolar disorder, HTN, hemorrhagic stroke, DVT/PE and obesity who presents for ongoing back pain. For more than a month, pain has been primarily over right upper back. Physical therapy and trigger point injections were both suggested in March, as had a lot of localized pain over trapezius muscle. Previous lower back pain has not been as bothersome and denies any radiation of pain down arms or legs. Flexeril helps just a little. Could not afford lidocaine ointment. Has tried icy hot without much relief. She has been able to lift her right arm above her shoulder with more ease recently but still reports discomfort. Denies weakness. ROS: No rashes, no falls  Regarding obesity, patient reports doing well with goal of cutting out soda. She says she was nervous to get A1c checked because of having a lot of soda the last few months. She has been limited with her exercise due to pain but has recently started doing Tai Chi and has really been enjoying that.   Regarding anemia, patient denies having any periods for the last couple of months (previously was having prolonged and heavy menstruation). Denies dark stools. She is not taking iron supplementation. Denies increased fatigue.  Allergies  Allergen Reactions  . Latuda [Lurasidone] Other (See Comments)    Made leg muscles twitch (went off this herself in December, 2017)    Social History   Tobacco Use  . Smoking status: Former Smoker    Packs/day: 0.30    Years: 30.00    Pack years: 9.00    Types: Cigarettes    Start date: 11/04/1981    Last attempt to quit: 10/2016    Years since quitting: 1.3  . Smokeless tobacco: Never Used  Substance Use Topics  . Alcohol use: No    Alcohol/week: 0.0 oz    Comment: Quit 10/2016    Objective: Blood pressure 110/80, pulse 86, temperature 98.7 F (37.1 C), temperature source Oral, height 5' 4" (1.626  m), weight 294 lb 9.6 oz (133.6 kg), SpO2 97 %. Body mass index is 50.57 kg/m. Constitutional: Pleasant, morbidly obese female in NAD Cardiovascular: RRR, S1, S2, no m/r/g.  Pulmonary/Chest: Effort normal and breath sounds normal.  Musculoskeletal: Increased muscle tension over right upper back. FROM of neck. Able place right hand behind back, can move right arm above right shoulder. Normal ROM with external and internal rotation. Negative empty can test bilaterally.  Neurological: AOx3, no focal deficits. Negative straight leg raise.  Skin: Skin is warm and dry. No rash noted.  Psychiatric: Somewhat anxious affect.  Vitals reviewed  Assessment/Plan: Chronic right shoulder pain - Continues to restrict patient's activities due to discomfort. No signs of frozen shoulder on physical exam with normal ROM with internal and external rotation of shoulder. Appears to have trapezius muscle spasm and likely shoulder strain given pain with shoulder abduction.  - Will refer to physical therapy. - Could consider trigger point injection. - Continue prn flexeril. Can try OTC capsaicin.   OBESITY, NOS - Encouraged patient to continue with goal of not drinking soda and trying to become more active as pain allows. - A1c stable at 5.6 today. Patient motivated to limit medications and pleased with being outside of the prediabetes range.  Anemia - Will check CBC and ferritin level  Follow-up in a few weeks if no improvement.   Olene Floss, MD [facility], PGY-3

## 2018-02-11 NOTE — Assessment & Plan Note (Signed)
-   Will check CBC and ferritin level

## 2018-02-11 NOTE — Assessment & Plan Note (Signed)
-   Encouraged patient to continue with goal of not drinking soda and trying to become more active as pain allows. - A1c stable at 5.6 today. Patient motivated to limit medications and pleased with being outside of the prediabetes range.

## 2018-02-18 ENCOUNTER — Other Ambulatory Visit: Payer: Self-pay

## 2018-02-18 DIAGNOSIS — Z1231 Encounter for screening mammogram for malignant neoplasm of breast: Secondary | ICD-10-CM

## 2018-02-23 ENCOUNTER — Encounter: Payer: Self-pay | Admitting: Oncology

## 2018-02-23 ENCOUNTER — Other Ambulatory Visit: Payer: Self-pay

## 2018-02-23 ENCOUNTER — Ambulatory Visit (INDEPENDENT_AMBULATORY_CARE_PROVIDER_SITE_OTHER): Payer: Self-pay | Admitting: Oncology

## 2018-02-23 VITALS — BP 155/80 | HR 78 | Temp 98.0°F | Ht 64.0 in | Wt 300.6 lb

## 2018-02-23 DIAGNOSIS — Z888 Allergy status to other drugs, medicaments and biological substances status: Secondary | ICD-10-CM

## 2018-02-23 DIAGNOSIS — Z86711 Personal history of pulmonary embolism: Secondary | ICD-10-CM

## 2018-02-23 DIAGNOSIS — I611 Nontraumatic intracerebral hemorrhage in hemisphere, cortical: Secondary | ICD-10-CM

## 2018-02-23 DIAGNOSIS — Z86718 Personal history of other venous thrombosis and embolism: Secondary | ICD-10-CM

## 2018-02-23 DIAGNOSIS — Z7982 Long term (current) use of aspirin: Secondary | ICD-10-CM

## 2018-02-23 DIAGNOSIS — N920 Excessive and frequent menstruation with regular cycle: Secondary | ICD-10-CM

## 2018-02-23 DIAGNOSIS — Z87820 Personal history of traumatic brain injury: Secondary | ICD-10-CM

## 2018-02-23 DIAGNOSIS — I825Y2 Chronic embolism and thrombosis of unspecified deep veins of left proximal lower extremity: Secondary | ICD-10-CM

## 2018-02-23 DIAGNOSIS — D500 Iron deficiency anemia secondary to blood loss (chronic): Secondary | ICD-10-CM

## 2018-02-23 DIAGNOSIS — Z8781 Personal history of (healed) traumatic fracture: Secondary | ICD-10-CM

## 2018-02-23 NOTE — Progress Notes (Signed)
Hematology and Oncology Follow Up Visit  Carla Hanson [id number] May 25, 1966 52 y.o. 02/23/2018 3:37 PM   Principle Diagnosis: Encounter Diagnoses  Name Primary?  . History of pulmonary embolism Yes  . Chronic deep vein thrombosis (DVT) of proximal vein of left lower extremity ([place])   . Nontraumatic cortical hemorrhage of right cerebral hemisphere [facility])      Interim History: Follow-up visit for this 52 year old woman evaluated in January 2018 for advice on anticoagulation.  She has a complicated history detailed in my December 03, 2016 office note.  Briefly, she suffered bilateral pulmonary emboli and a right lower extremity DVT 2 months after a right ankle fracture requiring open reduction and internal fixation in July, 2013.  She received short-term anticoagulation.  She had a proximal unprovoked DVT in the left calf and in November 2014 was put back on anticoagulation.  She had another unprovoked left lower extremity DVT in June 2017.  She was on Xarelto anticoagulation when she presented on November 08, 2016 with acute intracerebral hemorrhagic  with subarachnoid bleed in the left temporal occipital region with an additional amount of hemorrhage right frontal region.  A witnessed seizure at that time.  Anticoagulation discontinued.  She was discharged on January 10 on 81 mg of aspirin daily.  Subsequent hypercoagulation evaluation revealed no obvious abnormalities.  She tested negative for both the factor V Leiden and prothrombin gene mutations, normal protein S, C, and Antithrombin levels.  Negative anticardiolipin antibodies, antibodies to beta-2 glycoprotein 1, and negative lupus anticoagulant.  There were no signs or symptoms to suggest underlying malignancy.  No signs or symptoms of a collagen vascular disorder.  She does have a chronic microcytic anemia which she attributes to heavy menstrual periods.  She denies hematochezia or hematuria. We discussed options at length.  She did not want to  go back on anticoagulation even at attenuated doses.  I recommended that she stay on the low-dose aspirin.  I also recommended a baseline colonoscopy in view of chronic microcytic anemia.  She elected not to get a colonoscopy.  She had stool guaiac card testing reported to her as negative.  She denies any change in bowel habit.  No hematochezia.  No family history of cancer. She has been on and off iron supplements.  It does not appear that she malabsorbs iron.  Iron started back in January 2018 when hemoglobin was 10.3.  Hemoglobin up to 12 when it was rechecked on February 10, 2017.  Off iron, hemoglobin fell back down to 8 g on October 2018 and she was put back on iron.  Recent value in anticipation of today's visit on February 09, 2018 hemoglobin is 10.5, MCV 73.  She has no new symptoms today.  She denies any dyspnea, chest pain, palpitations, leg pain or swelling.  No change in bowel habit.  No hematuria, no hematochezia or melena, she states that her menstrual periods are now very irregular and she has not had one since December but when she does get one they are heavy.    Medications: Current Outpatient Medications:  .  acetaminophen (TYLENOL) 325 MG tablet, Take 650 mg by mouth every 6 (six) hours as needed for mild pain., Disp: , Rfl:  .  albuterol (PROVENTIL HFA;VENTOLIN HFA) 108 (90 Base) MCG/ACT inhaler, Inhale 1 puff into the lungs every 6 (six) hours as needed for wheezing or shortness of breath., Disp: 8 g, Rfl: 5 .  albuterol (PROVENTIL) (2.5 MG/3ML) 0.083% nebulizer solution, Take 3 mLs (2.5  mg total) by nebulization every 6 (six) hours as needed for wheezing or shortness of breath., Disp: 75 mL, Rfl: 12 .  aspirin EC 81 MG EC tablet, Take 1 tablet (81 mg total) by mouth daily., Disp: 30 tablet, Rfl: 12 .  cyclobenzaprine (FLEXERIL) 10 MG tablet, Take 1 tablet (10 mg total) by mouth 3 (three) times daily as needed for muscle spasms., Disp: 30 tablet, Rfl: 0 .  ferrous sulfate 325 (65 FE) MG  tablet, Take 1 tablet (325 mg total) by mouth 2 (two) times daily with a meal. (Patient not taking: Reported on 02/03/2018), Disp: 60 tablet, Rfl: 2 .  losartan-hydrochlorothiazide (HYZAAR) 50-12.5 MG tablet, Take 1 tablet by mouth daily., Disp: 90 tablet, Rfl: 2  Allergies:  Allergies  Allergen Reactions  . Latuda [Lurasidone] Other (See Comments)    Made leg muscles twitch (went off this herself in December, 2017)    Review of Systems: See interim history Remaining ROS negative:   Physical Exam: Blood pressure (!) 155/80, pulse 78, temperature 98 F (36.7 C), height 5\' 4"  (1.626 m), weight (!) 300 lb 9.6 oz (136.4 kg), SpO2 100 %. Wt Readings from Last 3 Encounters:  02/23/18 (!) 300 lb 9.6 oz (136.4 kg)  02/09/18 294 lb 9.6 oz (133.6 kg)  02/03/18 299 lb 12.8 oz (136 kg)     General appearance: Pleasant obese African-American woman HENNT: Pharynx no erythema, exudate, mass, or ulcer. No thyromegaly or thyroid nodules Lymph nodes: No cervical, or supraclavicular  lymphadenopathy Breasts:  Lungs: Clear to auscultation, resonant to percussion throughout Heart: Regular rhythm, no murmur, no gallop, no rub, no click, no edema Abdomen: Soft, nontender, normal bowel sounds, no mass, no organomegaly Extremities: No edema, no calf tenderness Musculoskeletal: no joint deformities GU:  Vascular: Carotid pulses 2+, no bruits, distal pulses: Dorsalis pedis 1+ symmetric Neurologic: Alert, oriented, PERRLA,  , cranial nerves grossly normal, motor strength 5 over 5, reflexes 1+ symmetric, upper body coordination normal, finger to finger, rapid alternating movements, gait normal, Skin: No rash or ecchymosis  Lab Results: CBC W/Diff    Component Value Date/Time   WBC 8.5 02/09/2018 1501   WBC 12.0 (H) 11/10/2016 0230   RBC 4.52 02/09/2018 1501   RBC 4.56 11/10/2016 0230   HGB 10.5 (L) 02/09/2018 1501   HCT 33.0 (L) 02/09/2018 1501   PLT 413 (H) 02/09/2018 1501   MCV 73 (L) 02/09/2018  1501   MCH 23.2 (L) 02/09/2018 1501   MCH 22.6 (L) 11/10/2016 0230   MCHC 31.8 02/09/2018 1501   MCHC 31.2 11/10/2016 0230   RDW 16.5 (H) 02/09/2018 1501   LYMPHSABS 2.6 02/10/2017 1031   MONOABS 0.8 11/10/2016 0230   EOSABS 0.3 02/10/2017 1031   BASOSABS 0.1 02/10/2017 1031     Chemistry      Component Value Date/Time   NA 139 11/10/2016 0230   K 3.6 11/10/2016 0230   CL 108 11/10/2016 0230   CO2 23 11/10/2016 0230   BUN 10 11/10/2016 0230   CREATININE 0.74 11/10/2016 0230   CREATININE 0.57 11/09/2014 1032      Component Value Date/Time   CALCIUM 8.3 (L) 11/10/2016 0230   ALKPHOS 32 (L) 11/10/2016 0230   AST 17 11/10/2016 0230   ALT 17 11/10/2016 0230   BILITOT 0.7 11/10/2016 0230       Radiological Studies: No results found.  Impression:  1.  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  Anticoagulation complicated by occurrence of a  Xarelto related intracranial hemorrhage in January, 2018. She is encouraged to continue low-dose aspirin and she is even reluctant to do this.  There is no contraindication to going back on an anticoagulant if she has another event.  2. Chronic iron deficiency anemia: She is again encouraged to get a colonoscopy and she is reluctant to do this.  I told her as a compromise, stool testing with the new Cologuard test would be more sensitive than the routine guaiac cards. I encouraged her to stay on her oral iron replacement.  CC: Patient Care Team: Rogue Bussing, MD as PCP - General Beryle Beams Alyson Locket, MD as Consulting Physician (Oncology) Garvin Fila, MD as Consulting Physician (Neurology) Kennith Center, RD as Dietitian (Family Medicine)   Murriel Hopper, MD, [place]  Hematology-Oncology/Internal Medicine     4/22/20193:37 PM

## 2018-02-23 NOTE — Patient Instructions (Addendum)
Return as needed Stay on your iron tablets Talk to your primary care MD about the Cologuard stool test

## 2018-02-24 ENCOUNTER — Telehealth: Payer: Self-pay | Admitting: Internal Medicine

## 2018-02-24 NOTE — Telephone Encounter (Signed)
Pt is having eye surgery may 2 9am.  Pt needs dr to check for the release forms from [place]. Please call pt when these forms are received.

## 2018-02-25 NOTE — Telephone Encounter (Signed)
Completed form and placed in fax pile. Please inform patient.  Olene Floss, MD [place], PGY-3

## 2018-02-25 NOTE — Telephone Encounter (Signed)
Form has been faxed to Nobie Putnam at [facility] at 1730 on 02/25/2018. Marland KitchenOzella Almond, CMA

## 2018-02-26 NOTE — Telephone Encounter (Signed)
Patient wanted to know if she needed an appointment with me before her eye surgery. I let her know that she did not and that I had completed the form for Ophthalmology.

## 2018-03-03 ENCOUNTER — Other Ambulatory Visit: Payer: Self-pay | Admitting: Internal Medicine

## 2018-03-03 ENCOUNTER — Encounter: Payer: Self-pay | Admitting: Physical Therapy

## 2018-03-03 ENCOUNTER — Other Ambulatory Visit: Payer: Self-pay

## 2018-03-03 ENCOUNTER — Ambulatory Visit: Payer: Self-pay | Attending: Family Medicine | Admitting: Physical Therapy

## 2018-03-03 DIAGNOSIS — G8929 Other chronic pain: Secondary | ICD-10-CM | POA: Insufficient documentation

## 2018-03-03 DIAGNOSIS — R262 Difficulty in walking, not elsewhere classified: Secondary | ICD-10-CM | POA: Insufficient documentation

## 2018-03-03 DIAGNOSIS — M25511 Pain in right shoulder: Secondary | ICD-10-CM | POA: Insufficient documentation

## 2018-03-03 DIAGNOSIS — M25611 Stiffness of right shoulder, not elsewhere classified: Secondary | ICD-10-CM | POA: Insufficient documentation

## 2018-03-03 DIAGNOSIS — Z1231 Encounter for screening mammogram for malignant neoplasm of breast: Secondary | ICD-10-CM

## 2018-03-03 DIAGNOSIS — M5442 Lumbago with sciatica, left side: Secondary | ICD-10-CM | POA: Insufficient documentation

## 2018-03-03 DIAGNOSIS — M6281 Muscle weakness (generalized): Secondary | ICD-10-CM | POA: Insufficient documentation

## 2018-03-03 DIAGNOSIS — M5441 Lumbago with sciatica, right side: Secondary | ICD-10-CM | POA: Insufficient documentation

## 2018-03-04 ENCOUNTER — Encounter: Payer: Self-pay | Admitting: Physical Therapy

## 2018-03-04 NOTE — Therapy (Signed)
[place], [place], [address] Phone: [phone]   Fax:  [phone]  Physical Therapy Evaluation  Patient Details  Name: Carla Hanson MRN: [id number] Date of Birth: 1966/05/29 Referring Provider: Dr Rob Bunting   Encounter Date: 03/03/2018  PT End of Session - 03/04/18 0754    Visit Number  1    Number of Visits  16    Date for PT Re-Evaluation  04/29/18    Authorization Type  Self-pay     PT Start Time  1545    PT Stop Time  1632    PT Time Calculation (min)  47 min    Activity Tolerance  Patient limited by pain    Behavior During Therapy  [facility] for tasks assessed/performed       Past Medical History:  Diagnosis Date  . Anxiety   . Bipolar disorder ([place])   . Hyperlipidemia   . Hypertension   . Lipoma    Abdomen  . LIPOMA 01/20/2008  . Obesity   . Pneumonia   . Tobacco abuse     Past Surgical History:  Procedure Laterality Date  . CESAREAN SECTION    . CYSTECTOMY    . LIPOMA EXCISION  03/2011  . ORIF ANKLE FRACTURE  03/13/2012   Procedure: OPEN REDUCTION INTERNAL FIXATION (ORIF) ANKLE FRACTURE;  Surgeon: Jessy Oto, MD;  Location: [facility];  Service: Orthopedics;  Laterality: Right;    There were no vitals filed for this visit.   Subjective Assessment - 03/03/18 1549    Subjective  Patient reports that she is experiencing pain in the middle of her lower back that started years ago. The LBP extends down to both hips. Patient experiences occasional numbness on both sides of her hip. Patient is also experiencing pain in her right shoulder which wraps around anteriorly, under the shoulder and in the back. The shoulder pain came on gradually about 2-3 months ago.     How long can you stand comfortably?  <15mins     Diagnostic tests  shifting to the right     Patient Stated Goals  "I just dont want to hurt"    Pain Score  8     Pain Location  Back    Pain Orientation  Lower    Pain  Descriptors / Indicators  Burning    Pain Type  Chronic pain    Pain Onset  More than a month ago    Pain Frequency  Constant    Aggravating Factors   prolonged sitting; exercising; bending forward     Pain Relieving Factors  walking while holding on to stroller; icy hot lying on side and bending knee     Multiple Pain Sites  Yes    Pain Location  Shoulder    Pain Orientation  Right    Pain Descriptors / Indicators  Sharp    Pain Type  Chronic pain;Acute pain    Pain Radiating Towards  down deltoid and upper trap     Pain Onset  More than a month ago    Pain Frequency  Constant    Aggravating Factors   lifting too much; sweeping the floor    Pain Relieving Factors  muscle relaxers     Effect of Pain on Daily Activities  diffculty with ADLs         [facility] PT Assessment - 03/04/18 0001      Assessment   Medical Diagnosis  Low  Back Pain     Referring Provider  Dr Deidre Ala Fitegerald    Onset Date/Surgical Date  -- LBP: years ago Shoulder: 2-3 months     Hand Dominance  Right    Next MD Visit  -- Not scheduled    Prior Therapy  No      Precautions   Precautions  None      Restrictions   Weight Bearing Restrictions  No      Balance Screen   Has the patient fallen in the past 6 months  No    Has the patient had a decrease in activity level because of a fear of falling?   No    Is the patient reluctant to leave their home because of a fear of falling?   No      Home Environment   Living Environment  Private residence    Living Arrangements  Alone    Type of [place] Access  Level entry    Home Layout  One level      Prior Function   Level of Independence  Independent    Vocation  Unemployed      Cognition   Overall Cognitive Status  Within Functional Limits for tasks assessed    Attention  Sustained    Sustained Attention  Appears intact    Memory  Appears intact    Awareness  Appears intact    Problem Solving  Appears intact      Observation/Other  Assessments   Observations  Pt leaning to the left in the chair       Sensation   Light Touch  Appears Intact    Stereognosis  Appears Intact    Hot/Cold  Appears Intact    Proprioception  Appears Intact    Additional Comments  patient reports occasional numbness of the hips bilaterally       Coordination   Gross Motor Movements are Fluid and Coordinated  Yes    Fine Motor Movements are Fluid and Coordinated  Yes      Posture/Postural Control   Posture Comments  rounded shoulders       AROM   Right Shoulder Flexion  84 Degrees    Right Shoulder Internal Rotation  -- Can reach to S1     Right Shoulder External Rotation  -- can reach the back of her head with pain and compensation     Lumbar Flexion  75% limited to the right     Lumbar Extension  only to neutral     Lumbar - Right Side Bend  50% limited     Lumbar - Left Side Bend  50% limited       PROM   Left Hip Flexion  58      Strength   Right Hip Flexion  3/5    Right Hip ABduction  3/5    Right Hip ADduction  3/5    Left Hip Flexion  3/5    Left Hip ABduction  3/5      Bed Mobility   Bed Mobility  -- difficulty transfering from seated to supine       Ambulation/Gait   Gait Comments  decreased bilateral hip flexion and decreased bilateral hip rotation                 Objective measurements completed on examination: See above findings.      [facility] Adult PT Treatment/Exercise - 03/04/18 0001  Lumbar Exercises: Seated   Other Seated Lumbar Exercises  hamstring stretch 3x20 sec hold     Other Seated Lumbar Exercises  side lying signle knee to chest stretch 3x20 sec hold      Shoulder Exercises: Standing   Other Standing Exercises  standing pendulum 3 way  x10 each              PT Education - 03/04/18 0754    Education provided  Yes    Education Details  initial HEP; Exercise technique    Person(s) Educated  Patient    Methods  Explanation;Demonstration;Tactile cues;Verbal cues;Handout     Comprehension  Verbalized understanding;Returned demonstration;Need further instruction       PT Short Term Goals - 03/03/18 1613      PT SHORT TERM GOAL #1   Title  Patient will increase right active shoulder felxion by 25 degrees    Time  4    Period  Weeks    Status  New    Target Date  03/31/18      PT SHORT TERM GOAL #2   Title  Patient will increase lumbar range of motion by 25%     Time  4    Period  Weeks    Status  New    Target Date  04/01/18      PT SHORT TERM GOAL #3   Title  Patient will increase right hip flexion to 100 degrees with < 2/10 pain     Time  4    Period  Weeks    Status  New    Target Date  04/01/18      PT SHORT TERM GOAL #4   Title  Patient will increase bilateral hip strength to 4+/5 gross     Time  4    Period  Weeks    Status  New    Target Date  04/01/18        PT Long Term Goals - 03/04/18 0825      PT LONG TERM GOAL #1   Title  Patient demonstrates & verbalizes understanding of ongoing HEP / fitness plan without a significant increase in pain     Time  8    Period  Weeks    Status  New    Target Date  04/29/18      PT LONG TERM GOAL #2   Title  Patient will go stand for 30 minutes without self report of increased pain in order to improve ability to perfrom ADL's     Time  8    Period  Weeks    Status  New    Target Date  04/29/18      PT LONG TERM GOAL #3   Title  Patient will increase lumbar spine motion by 50% in order to bend down and put her shoes on    Time  8    Period  Weeks    Status  New    Target Date  04/29/18      PT LONG TERM GOAL #4   Title  Patient will demonstrate a 45% limitation on FOTO     Time  8    Period  Weeks    Status  New    Target Date  04/29/18             Plan - 03/03/18 1639    Clinical Impression Statement  Patient is a 52 year old female reporting to physical therapy  with chronic low back pain and acute on chronic right shoulder pain. Patient displays decrease bilateral  hip mobility and decreased hip strength. Patient also has decrease lumbar active range of motion limited by pain. Patient has increased muscle spasming in the lumbosacral region and was tender to palpation. Patient also has decreased shoulder active and passive range. Patient unable to lift arm against gravity. Patient is tender to palpation and has increased muscle spasming through the right upper trap, deltoid, and bicep. Patient displays a "knot" near the right insertion of the deltoid. She reports a hisotry of lipomas. She is very tender in the area. Therapy will continue to to monitor the area.  Patient will benefit from skilled therapy in order to increase lumbar, hip, and shoulder range of motion, increase hip and shoulder strength, decrease muscle spasming, and decrease pain in order to return to functional daily activities.     History and Personal Factors relevant to plan of care:  Obesity     Clinical Presentation  Evolving    Clinical Decision Making  Moderate    Rehab Potential  Fair    PT Frequency  2x / week    PT Duration  8 weeks    PT Treatment/Interventions  Gait training;Balance training;Patient/family education;ADLs/Self Care Home Management;Cryotherapy;Electrical Stimulation;Iontophoresis 4mg /ml Dexamethasone;Moist Heat;Traction;Ultrasound;Stair training;Functional mobility training;Therapeutic activities;Therapeutic exercise;Manual techniques;Passive range of motion;Dry needling;Taping    PT Next Visit Plan  Shoulder PROM; Hip PROM; STM/IASTM to lumbar region; abdoiminal breathing; side bending to the right; consider ball roll out; consider wand flexion when able. May requoire more sitting and watanding exercises and shoulder exercises on a wedge.     PT Home Exercise Plan  Pendulums; Knee to chest stretch in sidelying; seated hamstring stretch     Consulted and Agree with Plan of Care  Patient       Patient will benefit from skilled therapeutic intervention in order to improve  the following deficits and impairments:  Abnormal gait, Pain, Increased muscle spasms, Decreased endurance, Decreased strength, Decreased range of motion  Visit Diagnosis: Chronic bilateral low back pain with bilateral sciatica  Muscle weakness (generalized)  Acute pain of right shoulder  Stiffness of right shoulder, not elsewhere classified  Difficulty in walking, not elsewhere classified     Problem List Patient Active Problem List   Diagnosis Date Noted  . Notalgia 02/11/2018  . Anemia 02/11/2018  . Chronic right shoulder pain 01/21/2018  . Chronic bilateral low back pain with left-sided sciatica 12/14/2017  . History of stroke 08/13/2017  . Irregular menstrual cycle 08/13/2017  . Iron deficiency anemia 12/12/2016  . Seizure ([place])   . ICH (intracerebral hemorrhage) ([place]) 11/08/2016  . Encounter for weight loss counseling 06/09/2016  . Tobacco abuse 05/10/2016  . Chronic obstructive pulmonary disease (COPD) ([place]) 05/10/2016  . History of alcohol dependence ([place]) 01/11/2016  . URI (upper respiratory infection) 02/24/2014  . Left leg DVT ([place]) 09/29/2013  . Pre-diabetes 03/02/2013  . History of pulmonary embolism 05/05/2012  . Painful menstrual periods 01/05/2012  . Bipolar disorder ([place]) 07/03/2010  . History of tobacco abuse 08/23/2009  . Hyperlipidemia 04/30/2007  . OBESITY, NOS 01/01/2007  . HYPERTENSION, BENIGN SYSTEMIC 01/01/2007    Carney Living PT DPT  03/04/2018, 8:39 AM Cooper Render PT DPT  03/04/2018 08:39AM    [place] [facility] [address] [place], [place], [address] Phone: [phone]   Fax:  [phone]  Name: Carla Hanson MRN: [id number] Date of Birth: December 02, 1965

## 2018-03-13 ENCOUNTER — Encounter

## 2018-03-17 ENCOUNTER — Ambulatory Visit: Payer: Self-pay | Attending: Family Medicine | Admitting: Physical Therapy

## 2018-03-17 ENCOUNTER — Encounter: Payer: Self-pay | Admitting: Physical Therapy

## 2018-03-17 ENCOUNTER — Ambulatory Visit: Payer: Self-pay | Admitting: Internal Medicine

## 2018-03-17 DIAGNOSIS — R262 Difficulty in walking, not elsewhere classified: Secondary | ICD-10-CM | POA: Insufficient documentation

## 2018-03-17 DIAGNOSIS — M25511 Pain in right shoulder: Secondary | ICD-10-CM | POA: Insufficient documentation

## 2018-03-17 DIAGNOSIS — M5442 Lumbago with sciatica, left side: Secondary | ICD-10-CM | POA: Insufficient documentation

## 2018-03-17 DIAGNOSIS — G8929 Other chronic pain: Secondary | ICD-10-CM | POA: Insufficient documentation

## 2018-03-17 DIAGNOSIS — M6281 Muscle weakness (generalized): Secondary | ICD-10-CM | POA: Insufficient documentation

## 2018-03-17 DIAGNOSIS — M25611 Stiffness of right shoulder, not elsewhere classified: Secondary | ICD-10-CM | POA: Insufficient documentation

## 2018-03-17 DIAGNOSIS — M5441 Lumbago with sciatica, right side: Secondary | ICD-10-CM | POA: Insufficient documentation

## 2018-03-17 NOTE — Therapy (Signed)
[place], [place], [address] Phone: [phone]   Fax:  [phone]  Physical Therapy Treatment  Patient Details  Name: Carla Hanson MRN: [id number] Date of Birth: 1966/07/04 Referring Provider: Dr Tye Savoy Fite[place]   Encounter Date: 03/17/2018  PT End of Session - 03/17/18 1805    Visit Number  2    Number of Visits  16    Date for PT Re-Evaluation  04/29/18    PT Start Time  1503    PT Stop Time  1544    PT Time Calculation (min)  41 min    Activity Tolerance  Patient tolerated treatment well    Behavior During Therapy  [facility] for tasks assessed/performed       Past Medical History:  Diagnosis Date  . Anxiety   . Bipolar disorder ([place])   . Hyperlipidemia   . Hypertension   . Lipoma    Abdomen  . LIPOMA 01/20/2008  . Obesity   . Pneumonia   . Tobacco abuse     Past Surgical History:  Procedure Laterality Date  . CESAREAN SECTION    . CYSTECTOMY    . LIPOMA EXCISION  03/2011  . ORIF ANKLE FRACTURE  03/13/2012   Procedure: OPEN REDUCTION INTERNAL FIXATION (ORIF) ANKLE FRACTURE;  Surgeon: Jessy Oto, MD;  Location: [facility];  Service: Orthopedics;  Laterality: Right;    There were no vitals filed for this visit.  Subjective Assessment - 03/17/18 1508    Subjective  has tried to do the exercises.  No pain yet.  has  pain constantly.     Currently in Pain?  Yes    Pain Score  8     Pain Location  Back    Pain Orientation  Lower;Right;Left    Pain Descriptors / Indicators  Aching;Tightness a band feeling  Right tighter than left.      Pain Type  Chronic pain    Pain Frequency  Constant    Aggravating Factors   ADL's    Pain Relieving Factors  frequent sitting,  leaning on elbows to do dishes    Effect of Pain on Daily Activities  Extra time with cleaning ADL's,  wakes her in pain    Pain Score  8    Pain Location  Shoulder    Pain Orientation  Right;Left RT>  LT    Pain  Descriptors / Indicators  Sharp    Pain Type  Chronic pain;Acute pain    Pain Radiating Towards  lateral mid deltoid.     Aggravating Factors   ADL's cleaning.   dressing ,  bathing    Pain Relieving Factors  leaning on sink to wash dishes,  sit in chair to arrange grogeries  etc,  sit the gets back up to clean.   Extra time      Effect of Pain on Daily Activities  Wakes from sleeping,  sit for kitchen work,  Extra time to do ADL's cleaning.  gets help to change bed.                        [place] [facility] PT Treatment/Exercise - 03/17/18 0001      Ambulation/Gait   Ambulation/Gait  Yes    Gait Comments  supportive shoes mirror used to show collapse  LT> Right      Posture/Postural Control   Posture Comments  able to decrease low back and upper  trap pain with roll parallel spine and practice      Self-Care   Self-Care  ADL's;Posture extra time with posture,  ADL handout sitting      Lumbar Exercises: Seated   Other Seated Lumbar Exercises  abdominal isometrics sitting both and single arms pressing thighs 10 X each   HEP after cues.      Shoulder Exercises: Seated   Retraction  10 reps after cues this feels good,  was hyper ext low back.       Shoulder Exercises: Standing   Other Standing Exercises  standing pendulum 3 way  x10 each  cued for technique  less pain.              PT Education - 03/17/18 1800    Education provided  Yes    Education Details  ADL,  HEP    Person(s) Educated  Patient    Methods  Explanation;Demonstration;Verbal cues;Handout    Comprehension  Returned demonstration;Verbalized understanding;Need further instruction ADL need more instructions.       PT Short Term Goals - 03/03/18 1613      PT SHORT TERM GOAL #1   Title  Patient will increase right active shoulder felxion by 25 degrees    Time  4    Period  Weeks    Status  New    Target Date  03/31/18      PT SHORT TERM GOAL #2   Title  Patient will increase lumbar range of  motion by 25%     Time  4    Period  Weeks    Status  New    Target Date  04/01/18      PT SHORT TERM GOAL #3   Title  Patient will increase right hip flexion to 100 degrees with < 2/10 pain     Time  4    Period  Weeks    Status  New    Target Date  04/01/18      PT SHORT TERM GOAL #4   Title  Patient will increase bilateral hip strength to 4+/5 gross     Time  4    Period  Weeks    Status  New    Target Date  04/01/18        PT Long Term Goals - 03/04/18 0825      PT LONG TERM GOAL #1   Title  Patient demonstrates & verbalizes understanding of ongoing HEP / fitness plan without a significant increase in pain     Time  8    Period  Weeks    Status  New    Target Date  04/29/18      PT LONG TERM GOAL #2   Title  Patient will go stand for 30 minutes without self report of increased pain in order to improve ability to perfrom ADL's     Time  8    Period  Weeks    Status  New    Target Date  04/29/18      PT LONG TERM GOAL #3   Title  Patient will increase lumbar spine motion by 50% in order to bend down and put her shoes on    Time  8    Period  Weeks    Status  New    Target Date  04/29/18      PT LONG TERM GOAL #4   Title  Patient will demonstrate a 45% limitation on FOTO  Time  8    Period  Weeks    Status  New    Target Date  04/29/18            Plan - 03/17/18 1806    Clinical Impression Statement  Patient needed cues with pendelum exercises.  Session focuesed on posture ed and ADL's.  Patient was able to have decreased pain doing shoulder retraction without overcorrecting and extending low back.     Current shoes are unsupportive and she cannot afford any.  She recently started walking for exercises however if shoes are not supportive, I fear she will have increased knee pain.     PT Next Visit Plan  Review all exercises and see if they can be done without a lot of pain,  answer any posture ADL questions.  Shoulder PROM; Hip PROM; STM/IASTM to  lumbar region; abdoiminal breathing; side bending to the right; consider ball roll out; consider wand flexion when able. May requoire more sitting and watanding exercises and shoulder exercises on a wedge.     PT Home Exercise Plan  Pendulums; Knee to chest stretch in sidelying; seated hamstring stretch abdominal isometrics.    Consulted and Agree with Plan of Care  Patient       Patient will benefit from skilled therapeutic intervention in order to improve the following deficits and impairments:     Visit Diagnosis: Chronic bilateral low back pain with bilateral sciatica  Muscle weakness (generalized)  Acute pain of right shoulder  Stiffness of right shoulder, not elsewhere classified  Difficulty in walking, not elsewhere classified     Problem List Patient Active Problem List   Diagnosis Date Noted  . Notalgia 02/11/2018  . Anemia 02/11/2018  . Chronic right shoulder pain 01/21/2018  . Chronic bilateral low back pain with left-sided sciatica 12/14/2017  . History of stroke 08/13/2017  . Irregular menstrual cycle 08/13/2017  . Iron deficiency anemia 12/12/2016  . Seizure ([place])   . ICH (intracerebral hemorrhage) ([place]) 11/08/2016  . Encounter for weight loss counseling 06/09/2016  . Tobacco abuse 05/10/2016  . Chronic obstructive pulmonary disease (COPD) ([place]) 05/10/2016  . History of alcohol dependence ([place]) 01/11/2016  . URI (upper respiratory infection) 02/24/2014  . Left leg DVT ([place]) 09/29/2013  . Pre-diabetes 03/02/2013  . History of pulmonary embolism 05/05/2012  . Painful menstrual periods 01/05/2012  . Bipolar disorder ([place]) 07/03/2010  . History of tobacco abuse 08/23/2009  . Hyperlipidemia 04/30/2007  . OBESITY, NOS 01/01/2007  . HYPERTENSION, BENIGN SYSTEMIC 01/01/2007    HARRIS,KAREN PTA 03/17/2018, 6:11 PM  [facility] [address] [place], [place], [address] Phone: [phone]   Fax:   [phone]  Name: LAVONYA HOERNER MRN: [id number] Date of Birth: 07-14-1966

## 2018-03-17 NOTE — Patient Instructions (Addendum)

## 2018-03-19 ENCOUNTER — Encounter: Payer: Self-pay | Admitting: Internal Medicine

## 2018-03-19 ENCOUNTER — Other Ambulatory Visit: Payer: Self-pay

## 2018-03-19 ENCOUNTER — Ambulatory Visit (INDEPENDENT_AMBULATORY_CARE_PROVIDER_SITE_OTHER): Payer: Self-pay | Admitting: Internal Medicine

## 2018-03-19 VITALS — BP 124/64 | HR 72 | Temp 98.8°F | Ht 64.0 in | Wt 304.2 lb

## 2018-03-19 DIAGNOSIS — M7989 Other specified soft tissue disorders: Secondary | ICD-10-CM

## 2018-03-19 DIAGNOSIS — Z6841 Body Mass Index (BMI) 40.0 and over, adult: Secondary | ICD-10-CM

## 2018-03-19 NOTE — Patient Instructions (Addendum)
Carla Hanson,  I am so glad you are walking every day and working to cut out sugary beverages.   I think getting more information about the gastric sleeve would be great. I have worked with Dr. Kieth Brightly at [facility]. They usually like for you to attend an information session before having a consultation. I am not sure about payment options or whether they would accept the orange card. I recommend calling [phone] to get more information about financial options.  For leg swelling, limiting salt will help. Your blood pressure medicine also should help. You can try compression socks or stockings. If you would like a prescription for these, let me know.  Best, Dr. Ola Spurr   DASH Eating Plan DASH stands for "Dietary Approaches to Stop Hypertension." The DASH eating plan is a healthy eating plan that has been shown to reduce high blood pressure (hypertension). It may also reduce your risk for type 2 diabetes, heart disease, and stroke. The DASH eating plan may also help with weight loss. What are tips for following this plan? General guidelines  Avoid eating more than 2,300 mg (milligrams) of salt (sodium) a day. If you have hypertension, you may need to reduce your sodium intake to 1,500 mg a day.  Limit alcohol intake to no more than 1 drink a day for nonpregnant women and 2 drinks a day for men. One drink equals 12 oz of beer, 5 oz of wine, or 1 oz of hard liquor.  Work with your health care provider to maintain a healthy body weight or to lose weight. Ask what an ideal weight is for you.  Get at least 30 minutes of exercise that causes your heart to beat faster (aerobic exercise) most days of the week. Activities may include walking, swimming, or biking.  Work with your health care provider or diet and nutrition specialist (dietitian) to adjust your eating plan to your individual calorie needs. Reading food labels  Check food labels for the amount of sodium per serving.  Choose foods with less than 5 percent of the Daily Value of sodium. Generally, foods with less than 300 mg of sodium per serving fit into this eating plan.  To find whole grains, look for the word "whole" as the first word in the ingredient list. Shopping  Buy products labeled as "low-sodium" or "no salt added."  Buy fresh foods. Avoid canned foods and premade or frozen meals. Cooking  Avoid adding salt when cooking. Use salt-free seasonings or herbs instead of table salt or sea salt. Check with your health care provider or pharmacist before using salt substitutes.  Do not fry foods. Cook foods using healthy methods such as baking, boiling, grilling, and broiling instead.  Cook with heart-healthy oils, such as olive, canola, soybean, or sunflower oil. Meal planning   Eat a balanced diet that includes: ? 5 or more servings of fruits and vegetables each day. At each meal, try to fill half of your plate with fruits and vegetables. ? Up to 6-8 servings of whole grains each day. ? Less than 6 oz of lean meat, poultry, or fish each day. A 3-oz serving of meat is about the same size as a deck of cards. One egg equals 1 oz. ? 2 servings of low-fat dairy each day. ? A serving of nuts, seeds, or beans 5 times each week. ? Heart-healthy fats. Healthy fats called Omega-3 fatty acids are found in foods such as flaxseeds and coldwater fish, like sardines, salmon, and mackerel.  Limit how much you eat of the following: ? Canned or prepackaged foods. ? Food that is high in trans fat, such as fried foods. ? Food that is high in saturated fat, such as fatty meat. ? Sweets, desserts, sugary drinks, and other foods with added sugar. ? Full-fat dairy products.  Do not salt foods before eating.  Try to eat at least 2 vegetarian meals each week.  Eat more home-cooked food and less restaurant, buffet, and fast food.  When eating at a restaurant, ask that your food be prepared with less salt or no  salt, if possible. What foods are recommended? The items listed may not be a complete list. Talk with your dietitian about what dietary choices are best for you. Grains Whole-grain or whole-wheat bread. Whole-grain or whole-wheat pasta. Brown rice. [place]. Bulgur. Whole-grain and low-sodium cereals. Pita bread. Low-fat, low-sodium crackers. Whole-wheat flour tortillas. Vegetables Fresh or frozen vegetables (raw, steamed, roasted, or grilled). Low-sodium or reduced-sodium tomato and vegetable juice. Low-sodium or reduced-sodium tomato sauce and tomato paste. Low-sodium or reduced-sodium canned vegetables. Fruits All fresh, dried, or frozen fruit. Canned fruit in natural juice (without added sugar). Meat and other protein foods Skinless chicken or [place]. Ground chicken or [place]. Pork with fat trimmed off. Fish and seafood. Egg whites. Dried beans, peas, or lentils. Unsalted nuts, nut butters, and seeds. Unsalted canned beans. Lean cuts of beef with fat trimmed off. Low-sodium, lean deli meat. Dairy Low-fat (1%) or fat-free (skim) milk. Fat-free, low-fat, or reduced-fat cheeses. Nonfat, low-sodium ricotta or cottage cheese. Low-fat or nonfat yogurt. Low-fat, low-sodium cheese. Fats and oils Soft margarine without trans fats. Vegetable oil. Low-fat, reduced-fat, or light mayonnaise and salad dressings (reduced-sodium). Canola, safflower, olive, soybean, and sunflower oils. Avocado. Seasoning and other foods Herbs. Spices. Seasoning mixes without salt. Unsalted popcorn and pretzels. Fat-free sweets. What foods are not recommended? The items listed may not be a complete list. Talk with your dietitian about what dietary choices are best for you. Grains Baked goods made with fat, such as croissants, muffins, or some breads. Dry pasta or rice meal packs. Vegetables Creamed or fried vegetables. Vegetables in a cheese sauce. Regular canned vegetables (not low-sodium or reduced-sodium). Regular  canned tomato sauce and paste (not low-sodium or reduced-sodium). Regular tomato and vegetable juice (not low-sodium or reduced-sodium). Carla Hanson. Olives. Fruits Canned fruit in a light or heavy syrup. Fried fruit. Fruit in cream or butter sauce. Meat and other protein foods Fatty cuts of meat. Ribs. Fried meat. Carla Hanson. Sausage. Bologna and other processed lunch meats. Salami. Fatback. Hotdogs. Bratwurst. Salted nuts and seeds. Canned beans with added salt. Canned or smoked fish. Whole eggs or egg yolks. Chicken or [place] with skin. Dairy Whole or 2% milk, cream, and half-and-half. Whole or full-fat cream cheese. Whole-fat or sweetened yogurt. Full-fat cheese. Nondairy creamers. Whipped toppings. Processed cheese and cheese spreads. Fats and oils Butter. Stick margarine. Lard. Shortening. Ghee. Bacon fat. Tropical oils, such as coconut, palm kernel, or palm oil. Seasoning and other foods Salted popcorn and pretzels. Onion salt, garlic salt, seasoned salt, table salt, and sea salt. Worcestershire sauce. Tartar sauce. Barbecue sauce. Teriyaki sauce. Soy sauce, including reduced-sodium. Steak sauce. Canned and packaged gravies. Fish sauce. Oyster sauce. Cocktail sauce. Horseradish that you find on the shelf. Ketchup. Mustard. Meat flavorings and tenderizers. Bouillon cubes. Hot sauce and Tabasco sauce. Premade or packaged marinades. Premade or packaged taco seasonings. Relishes. Regular salad dressings. Where to find more information:  National Heart, Lung, and Blood  Institute: https://wilson-eaton.com/  American Heart Association: www.heart.org Summary  The DASH eating plan is a healthy eating plan that has been shown to reduce high blood pressure (hypertension). It may also reduce your risk for type 2 diabetes, heart disease, and stroke.  With the DASH eating plan, you should limit salt (sodium) intake to 2,300 mg a day. If you have hypertension, you may need to reduce your sodium intake to 1,500 mg a  day.  When on the DASH eating plan, aim to eat more fresh fruits and vegetables, whole grains, lean proteins, low-fat dairy, and heart-healthy fats.  Work with your health care provider or diet and nutrition specialist (dietitian) to adjust your eating plan to your individual calorie needs. This information is not intended to replace advice given to you by your health care provider. Make sure you discuss any questions you have with your health care provider. Document Released: 10/10/2011 Document Revised: 10/14/2016 Document Reviewed: 10/14/2016 Elsevier Interactive Patient Education  [employer].

## 2018-03-20 ENCOUNTER — Ambulatory Visit: Payer: Self-pay | Admitting: Physical Therapy

## 2018-03-20 NOTE — Progress Notes (Signed)
[facility] Progress Note  Subjective:  Carla Hanson is a 52 y.o. female with history of hemorrhagic stroke, obesity, and former tobacco abuse who presents for check-up and to discuss weight. She reports feeling very frustrated by her weight. She would like to be more active but is limited by back pain. She has only had an intro session with PT and 1 formal session yesterday, so she says she can't say yet whether or not this is helping. She has noted some leg swelling the past week but also notes eating more salt. She does walk daily when she takes her grandchildren out and lifts her spirit by reading the bible. She has been working to cut out soda and sweet tea and plans to replace it with water with lemon. She does not regularly eat out meals, now maybe once a week. ROS: No orthopnea, no SOB  Per record review, her hematologist would like her to have colonoscopy or at least cologuard despite negative FOBT within last year given chronic microcytic anemia. Patient says she will think about this.   Allergies  Allergen Reactions  . Latuda [Lurasidone] Other (See Comments)    Made leg muscles twitch (went off this herself in December, 2017)    Social History   Tobacco Use  . Smoking status: Former Smoker    Packs/day: 0.30    Years: 30.00    Pack years: 9.00    Types: Cigarettes    Start date: 11/04/1981    Last attempt to quit: 10/2016    Years since quitting: 1.4  . Smokeless tobacco: Never Used  Substance Use Topics  . Alcohol use: No    Alcohol/week: 0.0 oz    Comment: Quit 10/2016    Objective: Blood pressure 124/64, pulse 72, temperature 98.8 F (37.1 C), temperature source Oral, height 5\' 4"  (1.626 m), weight (!) 304 lb 3.2 oz (138 kg), SpO2 99 %. Body mass index is 52.22 kg/m. Constitutional: Pleasant, morbidly obese female in NAD Cardiovascular: RRR, S1, S2, no m/r/g.  Pulmonary/Chest: Effort normal and breath sounds normal.  Musculoskeletal: Trace LE  edema near ankles. Negative Homan's sign.  Neurological: AOx3, no focal deficits. Skin: Skin is warm and dry. No rash noted.  Psychiatric: Normal mood and affect.  Vitals reviewed  Depression screen [facility] 2/9 03/19/2018 02/23/2018 01/21/2018  Decreased Interest 0 0 0  Down, Depressed, Hopeless 0 0 0  PHQ - 2 Score 0 0 0  Altered sleeping 0 - -  Tired, decreased energy 0 - -  Change in appetite 0 - -  Feeling bad or failure about yourself  1 - -  Trouble concentrating 0 - -  Moving slowly or fidgety/restless 0 - -  Suicidal thoughts 0 - -  PHQ-9 Score 1 - -  Difficult doing work/chores Not difficult at all - -  Some recent data might be hidden   Assessment/Plan: Class 3 severe obesity due to excess calories with serious comorbidity and body mass index (BMI) of 50.0 to 59.9 in adult [facility]) - Patient with history of stroke and decreased mobility due to pain.  - Reinforced her goal of limiting sugary beverages.  - Discussed bariatric surgery and discussed that she may be candidate for gastric sleeve, which she was more interested in than gastric bypass procedure. She asked for more information about this. Unsure if patient would be able to afford this or be able to receive financial assistance for this through [employer]. Provided number of [facility] for her to inquire about financial options/whether she needs referral to attend information session.  - Continue PT for increased mobility  Leg swelling - Only trace edema present on exam. Recommended DASH diet and provided handout. Suggested compression hose if bothers her. No dyspnea or crackles on lung exam to suggest symptomatic heart failure. No pain, warmth or asymmetry to suggest DVT.   Health maintenance: still considering colonoscopy. Cologuard would be very expensive for patient on [employer].   Follow-up in a few months to monitor weight loss efforts.  Olene Floss, MD [place], PGY-3

## 2018-03-22 ENCOUNTER — Encounter: Payer: Self-pay | Admitting: Internal Medicine

## 2018-03-22 DIAGNOSIS — M7989 Other specified soft tissue disorders: Secondary | ICD-10-CM | POA: Insufficient documentation

## 2018-03-22 NOTE — Assessment & Plan Note (Addendum)
-   Only trace edema present on exam. Recommended DASH diet and provided handout. Suggested compression hose if bothers her. No dyspnea or crackles on lung exam to suggest symptomatic heart failure. No pain, warmth or asymmetry to suggest DVT.

## 2018-03-22 NOTE — Assessment & Plan Note (Signed)
-   Patient with history of stroke and decreased mobility due to pain.  - Reinforced her goal of limiting sugary beverages.  - Discussed bariatric surgery and discussed that she may be candidate for gastric sleeve, which she was more interested in than gastric bypass procedure. She asked for more information about this. Unsure if patient would be able to afford this or be able to receive financial assistance for this through [employer]. Provided number of [facility] for her to inquire about financial options/whether she needs referral to attend information session.  - Continue PT for increased mobility

## 2018-03-24 ENCOUNTER — Ambulatory Visit: Payer: Self-pay | Admitting: Family Medicine

## 2018-03-24 ENCOUNTER — Ambulatory Visit: Payer: Self-pay | Admitting: Physical Therapy

## 2018-03-26 ENCOUNTER — Ambulatory Visit: Payer: Self-pay | Admitting: Physical Therapy

## 2018-03-31 ENCOUNTER — Other Ambulatory Visit: Payer: Self-pay | Admitting: Internal Medicine

## 2018-03-31 ENCOUNTER — Encounter: Payer: Self-pay | Admitting: Physical Therapy

## 2018-03-31 DIAGNOSIS — I10 Essential (primary) hypertension: Secondary | ICD-10-CM

## 2018-03-31 MED ORDER — LOSARTAN POTASSIUM-HCTZ 50-12.5 MG PO TABS: 1.0000 | ORAL_TABLET | Freq: Every day | ORAL | 2 refills | Status: DC

## 2018-03-31 NOTE — Telephone Encounter (Signed)
Pt needs refill on her losartan today. Please call pt and let her know when this has been done.

## 2018-03-31 NOTE — Telephone Encounter (Signed)
Called patient and let her know medication was ordered.  Olene Floss, MD [place], PGY-3

## 2018-04-02 ENCOUNTER — Encounter: Payer: Self-pay | Admitting: Physical Therapy

## 2018-04-07 ENCOUNTER — Encounter: Payer: Self-pay | Admitting: Physical Therapy

## 2018-04-07 ENCOUNTER — Ambulatory Visit: Payer: Self-pay | Attending: Family Medicine | Admitting: Physical Therapy

## 2018-04-07 DIAGNOSIS — M5441 Lumbago with sciatica, right side: Secondary | ICD-10-CM | POA: Insufficient documentation

## 2018-04-07 DIAGNOSIS — M25611 Stiffness of right shoulder, not elsewhere classified: Secondary | ICD-10-CM | POA: Insufficient documentation

## 2018-04-07 DIAGNOSIS — R262 Difficulty in walking, not elsewhere classified: Secondary | ICD-10-CM | POA: Insufficient documentation

## 2018-04-07 DIAGNOSIS — M5442 Lumbago with sciatica, left side: Secondary | ICD-10-CM | POA: Insufficient documentation

## 2018-04-07 DIAGNOSIS — M6281 Muscle weakness (generalized): Secondary | ICD-10-CM | POA: Insufficient documentation

## 2018-04-07 DIAGNOSIS — G8929 Other chronic pain: Secondary | ICD-10-CM | POA: Insufficient documentation

## 2018-04-07 DIAGNOSIS — M25511 Pain in right shoulder: Secondary | ICD-10-CM | POA: Insufficient documentation

## 2018-04-07 NOTE — Patient Instructions (Signed)
Shoulder Retraction / External Rotation With Band    With band held in front, keep elbows at side while rotating hands apart and pulling shoulder blades back. Hold __1_ seconds. Repeat __10__ times. Do __1 -2__ sessions per day.  yellow band.  Copyright  VHI. All rights reserved.

## 2018-04-07 NOTE — Therapy (Signed)
[place], [place], [address] Phone: [phone]   Fax:  [phone]  Physical Therapy Treatment  Patient Details  Name: Carla Hanson MRN: [id number] Date of Birth: Dec 11, 1965 Referring Provider: Dr Tye Savoy Fitegerald   Encounter Date: 04/07/2018  PT End of Session - 04/07/18 1738    Visit Number  3    Number of Visits  16    Date for PT Re-Evaluation  04/29/18    PT Start Time  1603 short session due to patient late    PT Stop Time  1630    PT Time Calculation (min)  27 min    Activity Tolerance  Patient tolerated treatment well    Behavior During Therapy  [facility] for tasks assessed/performed       Past Medical History:  Diagnosis Date  . Anxiety   . Bipolar disorder ([place])   . Hyperlipidemia   . Hypertension   . Lipoma    Abdomen  . LIPOMA 01/20/2008  . Obesity   . Pneumonia   . Tobacco abuse     Past Surgical History:  Procedure Laterality Date  . CESAREAN SECTION    . CYSTECTOMY    . LIPOMA EXCISION  03/2011  . ORIF ANKLE FRACTURE  03/13/2012   Procedure: OPEN REDUCTION INTERNAL FIXATION (ORIF) ANKLE FRACTURE;  Surgeon: Jessy Oto, MD;  Location: [facility];  Service: Orthopedics;  Laterality: Right;    There were no vitals filed for this visit.  Subjective Assessment - 04/07/18 1604    Subjective  Has eye surgery,  no new precautions. No right knee pain.  i almost fell after getting up from studying.    Currently in Pain?  Yes    Pain Score  -- 7 to 10/10    Pain Location  Back    Pain Orientation  Lower;Right;Left    Pain Descriptors / Indicators  Aching;Numbness hips numb    Pain Radiating Towards  right hip  catches every 4-5 th step.  pain to back of knee    Pain Frequency  Constant    Aggravating Factors   walking    Pain Relieving Factors  sitting,  leaning on elbows after washind dishes,  sleeping pill    Multiple Pain Sites  -- knee left 8/10    Pain Score  0    Pain  Location  Shoulder    Aggravating Factors   cleaning activities shoulder                       [facility] Adult PT Treatment/Exercise - 04/07/18 0001      Lumbar Exercises: Stretches   Figure 4 Stretch  1 rep;10 seconds sitting increased shoulder pain 5-6/10      Lumbar Exercises: Seated   Other Seated Lumbar Exercises  abdominal bracing with pelvic floor  10 x ,  ball squeeze with abdominal bracing,  posterior tilt panful X 3,   tilt seated painful.    Other Seated Lumbar Exercises  isometric abdominals 10 x both,  gluteal set,10 x      Shoulder Exercises: Seated   Retraction  10 reps cued for avoidance of compensation.    External Rotation  10 reps 2 sets 1 with yellow band HEP             PT Education - 04/07/18 1628    Education provided  Yes    Education Details  HEP  Person(s) Educated  Patient    Methods  Explanation;Demonstration;Tactile cues;Verbal cues;Handout    Comprehension  Verbalized understanding;Returned demonstration;Verbal cues required       PT Short Term Goals - 04/07/18 1743      PT SHORT TERM GOAL #1   Title  Patient will increase right active shoulder felxion by 25 degrees    Time  4    Period  Weeks    Status  Unable to assess      PT SHORT TERM GOAL #2   Title  Patient will increase lumbar range of motion by 25%     Time  4    Period  Weeks    Status  Unable to assess      PT SHORT TERM GOAL #3   Title  Patient will increase right hip flexion to 100 degrees with < 2/10 pain     Baseline  pain with hip flexion in sitting 90+ degrees with use of hands    Time  4    Period  Weeks    Status  On-going      PT SHORT TERM GOAL #4   Title  Patient will increase bilateral hip strength to 4+/5 gross     Time  4    Period  Weeks    Status  Unable to assess        PT Long Term Goals - 03/04/18 0825      PT LONG TERM GOAL #1   Title  Patient demonstrates & verbalizes understanding of ongoing HEP / fitness plan without a  significant increase in pain     Time  8    Period  Weeks    Status  New    Target Date  04/29/18      PT LONG TERM GOAL #2   Title  Patient will go stand for 30 minutes without self report of increased pain in order to improve ability to perfrom ADL's     Time  8    Period  Weeks    Status  New    Target Date  04/29/18      PT LONG TERM GOAL #3   Title  Patient will increase lumbar spine motion by 50% in order to bend down and put her shoes on    Time  8    Period  Weeks    Status  New    Target Date  04/29/18      PT LONG TERM GOAL #4   Title  Patient will demonstrate a 45% limitation on FOTO     Time  8    Period  Weeks    Status  New    Target Date  04/29/18            Plan - 04/07/18 1740    Clinical Impression Statement  Short session due to patient late.  Patient had brief pain flares with exercise.  She continues to report high pain levels.  She needed to exercise in sitting today for comfort.  Shoulder exercises were able to be progressed without pain increase.  Pain decreased at end of session no number given when asked.  Patient declined the need of a walker when suggested for pain control.    PT Next Visit Plan  review ER band,  may need to sit for lumbar stabilization,  figure 4 stretch as able,     PT Home Exercise Plan  Pendulums; Knee to chest stretch in sidelying; seated  hamstring stretch abdominal isometrics.  Shoulder ER, yellow band    Consulted and Agree with Plan of Care  Patient       Patient will benefit from skilled therapeutic intervention in order to improve the following deficits and impairments:     Visit Diagnosis: Chronic bilateral low back pain with bilateral sciatica  Muscle weakness (generalized)  Acute pain of right shoulder  Stiffness of right shoulder, not elsewhere classified  Difficulty in walking, not elsewhere classified     Problem List Patient Active Problem List   Diagnosis Date Noted  . Leg swelling 03/22/2018   . Notalgia 02/11/2018  . Anemia 02/11/2018  . Chronic right shoulder pain 01/21/2018  . Chronic bilateral low back pain with left-sided sciatica 12/14/2017  . History of stroke 08/13/2017  . Irregular menstrual cycle 08/13/2017  . Iron deficiency anemia 12/12/2016  . Seizure ([place])   . ICH (intracerebral hemorrhage) ([place]) 11/08/2016  . Encounter for weight loss counseling 06/09/2016  . Chronic obstructive pulmonary disease (COPD) ([place]) 05/10/2016  . History of alcohol dependence ([place]) 01/11/2016  . URI (upper respiratory infection) 02/24/2014  . Left leg DVT ([place]) 09/29/2013  . Pre-diabetes 03/02/2013  . History of pulmonary embolism 05/05/2012  . Painful menstrual periods 01/05/2012  . Bipolar disorder ([place]) 07/03/2010  . History of tobacco abuse 08/23/2009  . Hyperlipidemia 04/30/2007  . Class 3 severe obesity due to excess calories with serious comorbidity and body mass index (BMI) of 50.0 to 59.9 in adult ([place]) 01/01/2007  . HYPERTENSION, BENIGN SYSTEMIC 01/01/2007    HARRIS,KAREN  PTA 04/07/2018, 5:48 PM  [facility] [address] [place], [place], [address] Phone: [phone]   Fax:  [phone]  Name: Carla Hanson MRN: [id number] Date of Birth: 1966/01/19

## 2018-04-09 ENCOUNTER — Ambulatory Visit: Payer: Self-pay | Admitting: Physical Therapy

## 2018-04-13 ENCOUNTER — Ambulatory Visit: Payer: Self-pay | Admitting: Family Medicine

## 2018-04-16 ENCOUNTER — Other Ambulatory Visit: Payer: Self-pay | Admitting: *Deleted

## 2018-04-16 MED ORDER — ALBUTEROL SULFATE (2.5 MG/3ML) 0.083% IN NEBU: 2.5000 mg | INHALATION_SOLUTION | Freq: Four times a day (QID) | RESPIRATORY_TRACT | 12 refills | Status: DC | PRN

## 2018-04-23 ENCOUNTER — Ambulatory Visit: Payer: Self-pay | Admitting: Physical Therapy

## 2018-04-23 ENCOUNTER — Encounter: Payer: Self-pay | Admitting: Physical Therapy

## 2018-04-23 DIAGNOSIS — M25511 Pain in right shoulder: Secondary | ICD-10-CM

## 2018-04-23 DIAGNOSIS — R262 Difficulty in walking, not elsewhere classified: Secondary | ICD-10-CM

## 2018-04-23 DIAGNOSIS — M5441 Lumbago with sciatica, right side: Principal | ICD-10-CM

## 2018-04-23 DIAGNOSIS — M25611 Stiffness of right shoulder, not elsewhere classified: Secondary | ICD-10-CM

## 2018-04-23 DIAGNOSIS — G8929 Other chronic pain: Secondary | ICD-10-CM

## 2018-04-23 DIAGNOSIS — M6281 Muscle weakness (generalized): Secondary | ICD-10-CM

## 2018-04-23 DIAGNOSIS — M5442 Lumbago with sciatica, left side: Principal | ICD-10-CM

## 2018-04-23 NOTE — Patient Instructions (Addendum)
ISOMETRIC  ABDOMINALS  Sit with good posture Place hands on thighs. Breath in Breath out as you press hands into thighs  5 to 10 X   Repeat with single handsLeg Extension (Hamstring)    Sit toward front edge of chair, with leg out straight, heel on floor, toes pointing toward body. Keeping back straight, bend forward at hip, hold 10 to 30 seconds for a light stretch. Repeat _3__ times. Repeat with other leg. Hold _30__ seconds   Copyright  VHI. All rights reserved.

## 2018-04-23 NOTE — Therapy (Signed)
[place], [place], [address] Phone: [phone]   Fax:  [phone]  Physical Therapy Treatment  Patient Details  Name: Carla Hanson MRN: [id number] Date of Birth: 1966-05-19 Referring Provider: Dr Tye Savoy Fitegerald   Encounter Date: 04/23/2018  PT End of Session - 04/23/18 1505    Visit Number  4    Number of Visits  16    Date for PT Re-Evaluation  04/29/18    PT Start Time  5003    PT Stop Time  1502    PT Time Calculation (min)  45 min    Activity Tolerance  Patient tolerated treatment well    Behavior During Therapy  [facility] for tasks assessed/performed       Past Medical History:  Diagnosis Date  . Anxiety   . Bipolar disorder ([place])   . Hyperlipidemia   . Hypertension   . Lipoma    Abdomen  . LIPOMA 01/20/2008  . Obesity   . Pneumonia   . Tobacco abuse     Past Surgical History:  Procedure Laterality Date  . CESAREAN SECTION    . CYSTECTOMY    . LIPOMA EXCISION  03/2011  . ORIF ANKLE FRACTURE  03/13/2012   Procedure: OPEN REDUCTION INTERNAL FIXATION (ORIF) ANKLE FRACTURE;  Surgeon: Jessy Oto, MD;  [facility];  Service: Orthopedics;  Laterality: Right;    There were no vitals filed for this visit.  Subjective Assessment - 04/23/18 1423    Subjective  I have been doing the exercises with the bands.  Eye surgery last week.  Every thing went well.      Currently in Pain?  Yes    Pain Score  6     Pain Location  Back    Pain Orientation  Right;Left;Lower    Pain Descriptors / Indicators  Numbness stiff    Pain Type  Chronic pain    Pain Frequency  Constant    Aggravating Factors   walking    Pain Relieving Factors  sitting,  leaning on elbpww                        [facility] Adult PT Treatment/Exercise - 04/23/18 0001      Self-Care   Self-Care  Posture cued fatigues easily mod cues      Lumbar Exercises: Stretches   Active Hamstring Stretch  3  reps;30 seconds sitting    Pelvic Tilt  5 reps sitting      Lumbar Exercises: Aerobic   Nustep  6 minutes L2  UE/LE 413 steps      Lumbar Exercises: Seated   Other Seated Lumbar Exercises  neutral spine with reach overhead,  also with 1 LB weights.  Cued to avoid over extension in low back for this.     Other Seated Lumbar Exercises  isometric abdominals 3 positiond cued initially added to HEP      Shoulder Exercises: Seated   Row  10 reps red band cued for abdominal bracing    External Rotation  10 reps red band cued for abdominal bracing to decrease low back pai    Other Seated Exercises  Abdominal isometrics both and single added to HEP      Manual Therapy   Manual therapy comments  tennis ball fro soft tissue work sitting  felt good but also hurt.  issued ball for home use.  PT Education - 04/23/18 1505    Education provided  Yes    Education Details  HEP,  self care    Person(s) Educated  Patient    Methods  Explanation;Demonstration;Tactile cues;Verbal cues;Handout    Comprehension  Verbalized understanding;Returned demonstration       PT Short Term Goals - 04/07/18 1743      PT SHORT TERM GOAL #1   Title  Patient will increase right active shoulder felxion by 25 degrees    Time  4    Period  Weeks    Status  Unable to assess      PT SHORT TERM GOAL #2   Title  Patient will increase lumbar range of motion by 25%     Time  4    Period  Weeks    Status  Unable to assess      PT SHORT TERM GOAL #3   Title  Patient will increase right hip flexion to 100 degrees with < 2/10 pain     Baseline  pain with hip flexion in sitting 90+ degrees with use of hands    Time  4    Period  Weeks    Status  On-going      PT SHORT TERM GOAL #4   Title  Patient will increase bilateral hip strength to 4+/5 gross     Time  4    Period  Weeks    Status  Unable to assess        PT Long Term Goals - 03/04/18 0825      PT LONG TERM GOAL #1   Title  Patient  demonstrates & verbalizes understanding of ongoing HEP / fitness plan without a significant increase in pain     Time  8    Period  Weeks    Status  New    Target Date  04/29/18      PT LONG TERM GOAL #2   Title  Patient will go stand for 30 minutes without self report of increased pain in order to improve ability to perfrom ADL's     Time  8    Period  Weeks    Status  New    Target Date  04/29/18      PT LONG TERM GOAL #3   Title  Patient will increase lumbar spine motion by 50% in order to bend down and put her shoes on    Time  8    Period  Weeks    Status  New    Target Date  04/29/18      PT LONG TERM GOAL #4   Title  Patient will demonstrate a 45% limitation on FOTO     Time  8    Period  Weeks    Status  New    Target Date  04/29/18            Plan - 04/23/18 1506    Clinical Impression Statement  Patient able to progress HEP.  Sitting posture in neutral continues to be a challange with patient fatigue.  Manual decreased and increased her low back pain.  Patient is not always consistant with the HEP per demo of exercises.     PT Next Visit Plan  review Hamstring stretch ,  isometric abdominals.  ,  may need to sit for lumbar stabilization,  figure 4 stretch as able, R  Likes Nu step.    PT Home Exercise Plan  Pendulums; Knee  to chest stretch in sidelying; seated hamstring stretch abdominal isometrics.  Shoulder ER, yellow band  Isometric abdominals. Tennis ball for soft tissue work     Oncologist with Plan of Care  Patient       Patient will benefit from skilled therapeutic intervention in order to improve the following deficits and impairments:     Visit Diagnosis: Chronic bilateral low back pain with bilateral sciatica  Muscle weakness (generalized)  Acute pain of right shoulder  Stiffness of right shoulder, not elsewhere classified  Difficulty in walking, not elsewhere classified     Problem List Patient Active Problem List   Diagnosis  Date Noted  . Leg swelling 03/22/2018  . Notalgia 02/11/2018  . Anemia 02/11/2018  . Chronic right shoulder pain 01/21/2018  . Chronic bilateral low back pain with left-sided sciatica 12/14/2017  . History of stroke 08/13/2017  . Irregular menstrual cycle 08/13/2017  . Iron deficiency anemia 12/12/2016  . Seizure ([place])   . ICH (intracerebral hemorrhage) ([place]) 11/08/2016  . Encounter for weight loss counseling 06/09/2016  . Chronic obstructive pulmonary disease (COPD) ([place]) 05/10/2016  . History of alcohol dependence ([place]) 01/11/2016  . URI (upper respiratory infection) 02/24/2014  . Left leg DVT ([place]) 09/29/2013  . Pre-diabetes 03/02/2013  . History of pulmonary embolism 05/05/2012  . Painful menstrual periods 01/05/2012  . Bipolar disorder ([place]) 07/03/2010  . History of tobacco abuse 08/23/2009  . Hyperlipidemia 04/30/2007  . Class 3 severe obesity due to excess calories with serious comorbidity and body mass index (BMI) of 50.0 to 59.9 in adult ([place]) 01/01/2007  . HYPERTENSION, BENIGN SYSTEMIC 01/01/2007    HARRIS,KAREN PTA 04/23/2018, 3:11 PM  [facility] [address] [place], [place], [address] Phone: [phone]   Fax:  [phone]  Name: Carla Hanson MRN: [id number] Date of Birth: 05-29-1966

## 2018-04-28 ENCOUNTER — Other Ambulatory Visit: Payer: Self-pay | Admitting: *Deleted

## 2018-04-28 DIAGNOSIS — R062 Wheezing: Secondary | ICD-10-CM

## 2018-04-29 MED ORDER — ALBUTEROL SULFATE HFA 108 (90 BASE) MCG/ACT IN AERS: 1.0000 | INHALATION_SPRAY | Freq: Four times a day (QID) | RESPIRATORY_TRACT | 5 refills | Status: DC | PRN

## 2018-05-05 ENCOUNTER — Other Ambulatory Visit: Payer: Self-pay | Admitting: *Deleted

## 2018-05-05 DIAGNOSIS — I10 Essential (primary) hypertension: Secondary | ICD-10-CM

## 2018-05-05 MED ORDER — CYCLOBENZAPRINE HCL 10 MG PO TABS: 10.0000 mg | ORAL_TABLET | Freq: Three times a day (TID) | ORAL | 0 refills | Status: DC | PRN

## 2018-05-05 MED ORDER — LOSARTAN POTASSIUM-HCTZ 50-12.5 MG PO TABS: 1.0000 | ORAL_TABLET | Freq: Every day | ORAL | 2 refills | Status: DC

## 2018-05-12 ENCOUNTER — Ambulatory Visit: Payer: Self-pay | Admitting: Physical Therapy

## 2018-05-14 ENCOUNTER — Encounter: Payer: Self-pay | Admitting: Physical Therapy

## 2018-05-14 ENCOUNTER — Ambulatory Visit: Payer: Self-pay | Attending: Family Medicine | Admitting: Physical Therapy

## 2018-05-14 DIAGNOSIS — M25611 Stiffness of right shoulder, not elsewhere classified: Secondary | ICD-10-CM

## 2018-05-14 DIAGNOSIS — R262 Difficulty in walking, not elsewhere classified: Secondary | ICD-10-CM

## 2018-05-14 DIAGNOSIS — G8929 Other chronic pain: Secondary | ICD-10-CM

## 2018-05-14 DIAGNOSIS — M5442 Lumbago with sciatica, left side: Secondary | ICD-10-CM | POA: Insufficient documentation

## 2018-05-14 DIAGNOSIS — M5441 Lumbago with sciatica, right side: Secondary | ICD-10-CM | POA: Insufficient documentation

## 2018-05-14 DIAGNOSIS — M6281 Muscle weakness (generalized): Secondary | ICD-10-CM

## 2018-05-14 DIAGNOSIS — M25511 Pain in right shoulder: Secondary | ICD-10-CM

## 2018-05-15 NOTE — Therapy (Signed)
[place], [place], [address] Phone: [phone]   Fax:  [phone]  Physical Therapy Treatment/Re-Cert  Patient Details  Name: Carla Hanson MRN: [id number] Date of Birth: 28-Sep-1966 Referring Provider: Dr Tye Savoy Fitegerald   Encounter Date: 05/14/2018  PT End of Session - 05/14/18 1506    Visit Number  5    Number of Visits  16    Date for PT Re-Evaluation  04/29/18    Authorization Type  Self-pay     PT Start Time  1500    PT Stop Time  1540    PT Time Calculation (min)  40 min    Activity Tolerance  Patient tolerated treatment well    Behavior During Therapy  [facility] for tasks assessed/performed       Past Medical History:  Diagnosis Date  . Anxiety   . Bipolar disorder ([place])   . Hyperlipidemia   . Hypertension   . Lipoma    Abdomen  . LIPOMA 01/20/2008  . Obesity   . Pneumonia   . Tobacco abuse     Past Surgical History:  Procedure Laterality Date  . CESAREAN SECTION    . CYSTECTOMY    . LIPOMA EXCISION  03/2011  . ORIF ANKLE FRACTURE  03/13/2012   Procedure: OPEN REDUCTION INTERNAL FIXATION (ORIF) ANKLE FRACTURE;  Surgeon: Jessy Oto, MD;  [facility];  Service: Orthopedics;  Laterality: Right;    There were no vitals filed for this visit.  Subjective Assessment - 05/14/18 1507    Subjective  Patient rpeorts no real change in her back pain. She is having numbness in the sides of her legs . She can not stand for long. She is only doing her exercises when she is not in pain. She has been having shoulder pain but it is not really bother ing her.     Pertinent History  ICH, seizure, ORIF right ankle 03/13/2012, BiPolar, HTN, Lipoma, obesity, DVT    How long can you stand comfortably?  <22mins     Currently in Pain?  Yes    Pain Score  7     Pain Orientation  Right;Left;Lower    Pain Descriptors / Indicators  Aching    Pain Type  Chronic pain    Pain Onset  More than a month  ago    Pain Frequency  Constant    Aggravating Factors   walking     Pain Relieving Factors  sitting     Effect of Pain on Daily Activities  extra time with cleaning and ADL;s          [facility] PT Assessment - 05/15/18 0001      AROM   Right Shoulder Flexion  79 Degrees    Lumbar Flexion  85-90% limited with pain     Lumbar Extension  only able to come to nutral     Lumbar - Right Side Bend  50% limited     Lumbar - Left Side Bend  50% limited       Strength   Right Hip Flexion  3/5    Right Hip ABduction  3/5    Right Hip ADduction  3/5    Left Hip Flexion  3/5    Left Hip ABduction  3/5                   [facility] Adult PT Treatment/Exercise - 05/15/18 0001      Self-Care  Self-Care  ADL's;Posture    Posture  significant education provided on posture with ADL's and walking. Talked with patient about the improtance of exercise and exercise progression. Educated patient on symptom mangement at home.       Lumbar Exercises: Stretches   Active Hamstring Stretch  3 reps;30 seconds sitting      Lumbar Exercises: Seated   Other Seated Lumbar Exercises  seated ball squeeze with cuing for breathing and technique 2x10; seated abdominal breathing x10; Seated hip abduction 2x10 yellow       Shoulder Exercises: Seated   Other Seated Exercises  scap retraction 2x10 with cuing       Manual Therapy   Manual therapy comments   Reviewed tennis ball for soft tissue work sitting  felt good but also hurt.  issued ball for home use.              PT Education - 05/15/18 1220    Education provided  Yes    Education Details  reviewed HEP and self care    Person(s) Educated  Patient    Methods  Explanation;Demonstration;Tactile cues;Verbal cues    Comprehension  Verbalized understanding;Returned demonstration;Verbal cues required;Tactile cues required       PT Short Term Goals - 05/15/18 1229      PT SHORT TERM GOAL #1   Title  Patient will increase right active shoulder  felxion by 25 degrees    Baseline  decreased shoulder flexion     Time  4    Period  Weeks    Status  On-going      PT SHORT TERM GOAL #2   Title  Patient will increase lumbar range of motion by 25%     Baseline  less motion     Time  4    Period  Weeks    Status  On-going      PT SHORT TERM GOAL #3   Title  Patient will increase right hip flexion to 100 degrees with < 2/10 pain     Baseline  pain with hip flexion in sitting 90+ degrees with use of hands    Time  4    Period  Weeks    Status  On-going      PT SHORT TERM GOAL #4   Title  Patient will increase bilateral hip strength to 4+/5 gross     Time  4    Period  Weeks    Status  On-going        PT Long Term Goals - 05/15/18 1230      PT LONG TERM GOAL #1   Title  Patient demonstrates & verbalizes understanding of ongoing HEP / fitness plan without a significant increase in pain     Time  8    Period  Weeks    Status  On-going      PT LONG TERM GOAL #2   Title  Patient will go stand for 30 minutes without self report of increased pain in order to improve ability to perfrom ADL's     Time  8    Period  Weeks    Status  On-going      PT LONG TERM GOAL #3   Title  Patient will increase lumbar spine motion by 50% in order to bend down and put her shoes on    Time  8    Period  Weeks    Status  On-going      PT  LONG TERM GOAL #4   Title  Patient will demonstrate a 45% limitation on FOTO     Baseline  55 % limitation     Time  8    Period  Weeks    Status  On-going            Plan - 05/15/18 1220    Clinical Impression Statement  Patient reports she has not been doing her HEP much and has not been able to attend PT much. Thisis just her 5th visit since April. In that time she has had some personal matters and an eye surgery to attend to. Therapy talked with her about her current situation and the improtance of consitency with her home exercises and PT. She feels like at this time her other issues have  resolved and she will be able to be more complaint. 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 She was given a scaled back HEP to work on at home.     Clinical Presentation  Evolving    Clinical Decision Making  Moderate    Rehab Potential  Good    PT Frequency  2x / week    PT Duration  8 weeks    PT Treatment/Interventions  Gait training;Balance training;Patient/family education;ADLs/Self Care Home Management;Cryotherapy;Electrical Stimulation;Iontophoresis 4mg /ml Dexamethasone;Moist Heat;Traction;Ultrasound;Stair training;Functional mobility training;Therapeutic activities;Therapeutic exercise;Manual techniques;Passive range of motion;Dry needling;Taping    PT Next Visit Plan  review Hamstring stretch ,  isometric abdominals.  ,  may need to sit for lumbar stabilization,  figure 4 stretch as able, R  Likes Nu step.    PT Home Exercise Plan  Pendulums; Knee to chest stretch in sidelying; seated hamstring stretch abdominal isometrics.  Shoulder ER, yellow band  Isometric abdominals. Tennis ball for soft tissue work     Oncologist with Plan of Care  Patient       Patient will benefit from skilled therapeutic intervention in order to improve the following deficits and impairments:  Abnormal gait, Pain, Increased muscle spasms, Decreased endurance, Decreased strength, Decreased range of motion  Visit Diagnosis: Chronic bilateral low back pain with bilateral sciatica  Muscle weakness (generalized)  Acute pain of right shoulder  Stiffness of right shoulder, not elsewhere classified  Difficulty in walking, not elsewhere classified     Problem List Patient Active Problem List   Diagnosis Date Noted  . Leg swelling 03/22/2018  . Notalgia 02/11/2018  . Anemia 02/11/2018  . Chronic right shoulder pain 01/21/2018  . Chronic bilateral low back pain with left-sided sciatica 12/14/2017  . History of stroke 08/13/2017  .  Irregular menstrual cycle 08/13/2017  . Iron deficiency anemia 12/12/2016  . Seizure ([place])   . ICH (intracerebral hemorrhage) ([place]) 11/08/2016  . Encounter for weight loss counseling 06/09/2016  . Chronic obstructive pulmonary disease (COPD) ([place]) 05/10/2016  . History of alcohol dependence ([place]) 01/11/2016  . URI (upper respiratory infection) 02/24/2014  . Left leg DVT ([place]) 09/29/2013  . Pre-diabetes 03/02/2013  . History of pulmonary embolism 05/05/2012  . Painful menstrual periods 01/05/2012  . Bipolar disorder ([place]) 07/03/2010  . History of tobacco abuse 08/23/2009  . Hyperlipidemia 04/30/2007  . Class 3 severe obesity due to excess calories with serious comorbidity and body mass index (BMI) of 50.0 to 59.9 in adult ([place]) 01/01/2007  . HYPERTENSION, BENIGN SYSTEMIC 01/01/2007    Carney Living PT DPT  05/15/2018,  12:32 PM  [place], [place], [address] Phone: [phone]   Fax:  [phone]  Name: Carla Hanson MRN: [id number] Date of Birth: 22-Nov-1965

## 2018-05-18 ENCOUNTER — Other Ambulatory Visit: Payer: Self-pay

## 2018-05-18 NOTE — Telephone Encounter (Signed)
It looks like I filled this 1 week ago- did patient not get the prescription filled?  Lucila Maine, DO PGY-2, [facility] 05/18/2018 4:47 PM

## 2018-05-18 NOTE — Telephone Encounter (Signed)
Spoke with patient and asked her to contact health department to see if it was received last week and if not I would call it in tomorrow.  Patient voiced understanding. Jazmin Hartsell,CMA

## 2018-05-19 ENCOUNTER — Ambulatory Visit (INDEPENDENT_AMBULATORY_CARE_PROVIDER_SITE_OTHER): Payer: Self-pay | Admitting: Family Medicine

## 2018-05-19 ENCOUNTER — Encounter: Payer: Self-pay | Admitting: Family Medicine

## 2018-05-19 ENCOUNTER — Ambulatory Visit: Payer: Self-pay | Admitting: Physical Therapy

## 2018-05-19 DIAGNOSIS — I10 Essential (primary) hypertension: Secondary | ICD-10-CM

## 2018-05-19 DIAGNOSIS — Z6841 Body Mass Index (BMI) 40.0 and over, adult: Secondary | ICD-10-CM

## 2018-05-19 NOTE — Progress Notes (Signed)
Medical Nutrition Therapy:  Appt start time: 1100 end time:  1200  Assessment:  Primary concerns today: Weight management and Blood sugar control.  Garielle had cataract surgery in May and June, and being busy with grandchildren and church has gotten in the way of her behavior change efforts.  She has just recently started to get back on track with respect to diet and exercise.  She has eliminated most of the sugar in her diet with the exception of 1 c sweet tea with lunch and dinner.  Usually 1-2 X week she uses lifesaver mints when feeling the urge for something sweet.  She usually drinks ~64 oz of water daily.    24-hr recall suggests intake of ~2140 kcal:  (Up at 9:30 AM) B (10:30 AM)-  2 c Raisin Bran w/ dried bananas, 1 c 2% milk, 8 oz o.j.      640    Snk ( AM)-  4 oz [place] yogurt, water         150 L ( PM)-  1 chsbgr on bun, must, 1/2 tsp mayo, 1 c swt tea, 1/2 c fruit salad (cant, str'ber, ban) 600 Snk ( PM)-  Water  -- WALKED 30 min --            750 D (8:30 PM)-  1 chsbgr on bun, must, 1/2 tsp avocado mayo, 8 oz swt tea, 1/2 c fruit salad (cant, str'ber, ban), 1 c potato wedges Snk ( PM)-  water Typical day? Yes.    Learning Readiness: Change in progress  Recent physical activity includes walking 30 min 6 days a week with her mom.  (Just started 10 days ago.)   Still having leg and back pain,  As well as shoulder pain, although she has been doing PT for weeks.    Progress Towards Goal(s):  In progress.   Nutritional Diagnosis:  Progress noted on [place]-3.3 Overweight/obesity As related to energy balance.  As evidenced by significant dietary and physical activity changes, although no weight loss yet.    Intervention:  Nutrition education  Handouts given during visit include:  AVS  Goals Sheet (revised)  Demonstrated degree of understanding via:  Teach Back  Barriers to learning/adherence to lifestyle change: Continued leg and back pain makes exercise difficult, but Rivers is  committed to walking nonetheless, and is doing PT exercises regularly.    Monitoring/Evaluation:  Dietary intake, exercise, and body weight in 10 week(s).

## 2018-05-19 NOTE — Patient Instructions (Addendum)
-   Take your iron on an empty stomach for best absorption (or at least do not take at the same time as consuming milk or any other source of calcium).  You will also get best absorption of iron if you take at least 200 mg of vitamin C with it.    - The reality is that behavior change is HARD.  We need to allow ourselves some grace when we are struggling.  SELF-COMPASSION means forgiving AND supporting ourselves when the going gets tough.    - Practicing self-compassion helps you to be MORE successful in your behavior change.  This is because self-compassion allows you to avoid getting into that downward spiral of feeling bad, which is important b/c when you feel especially bad, you cannot learn from the experience.    - Check your sweet tea label to find out how much sugar and how many calories each 8 oz has.    - Mix the sweet with unsweetened tea, and you may be surprised to see how quickly you can adapt to and come to prefer the less sweet (or even UNsweet) tea!  Be consistent in how you drink your tea to make your taste preference change happen faster.    - Complete Goals Sheet, and bring to follow-up appt Sept 16 at 2:30 PM.   - AT F/U APPT: Raisin Bran w/ Bananas = 200 kcal/cup. Consider reducing portion size &/or adding fresh fruit.

## 2018-05-21 ENCOUNTER — Ambulatory Visit: Payer: Self-pay | Admitting: Physical Therapy

## 2018-05-26 ENCOUNTER — Ambulatory Visit: Payer: Self-pay | Admitting: Physical Therapy

## 2018-05-26 ENCOUNTER — Encounter: Payer: Self-pay | Admitting: Physical Therapy

## 2018-05-26 DIAGNOSIS — M5441 Lumbago with sciatica, right side: Principal | ICD-10-CM

## 2018-05-26 DIAGNOSIS — M5442 Lumbago with sciatica, left side: Principal | ICD-10-CM

## 2018-05-26 DIAGNOSIS — G8929 Other chronic pain: Secondary | ICD-10-CM

## 2018-05-26 DIAGNOSIS — M6281 Muscle weakness (generalized): Secondary | ICD-10-CM

## 2018-05-27 NOTE — Therapy (Signed)
[place], [place], [address] Phone: [phone]   Fax:  [phone]  Physical Therapy Treatment  Patient Details  Name: Carla Hanson MRN: [id number] Date of Birth: 06/07/1966 Referring Provider: Dr Tye Savoy Fitegerald   Encounter Date: 05/26/2018  PT End of Session - 05/26/18 1531    Visit Number  6    Number of Visits  16    Date for PT Re-Evaluation  04/29/18    Authorization Type  Self-pay     PT Start Time  1424    PT Stop Time  1520    PT Time Calculation (min)  56 min    Activity Tolerance  Patient tolerated treatment well    Behavior During Therapy  [facility] for tasks assessed/performed       Past Medical History:  Diagnosis Date  . Anxiety   . Bipolar disorder ([place])   . Hyperlipidemia   . Hypertension   . Lipoma    Abdomen  . LIPOMA 01/20/2008  . Obesity   . Pneumonia   . Tobacco abuse     Past Surgical History:  Procedure Laterality Date  . CESAREAN SECTION    . CYSTECTOMY    . LIPOMA EXCISION  03/2011  . ORIF ANKLE FRACTURE  03/13/2012   Procedure: OPEN REDUCTION INTERNAL FIXATION (ORIF) ANKLE FRACTURE;  Surgeon: Jessy Oto, MD;  Location: [facility];  Service: Orthopedics;  Laterality: Right;    There were no vitals filed for this visit.  Subjective Assessment - 05/26/18 1427    Subjective  Patient reports she has been doing her exercises. Her pain levels are still high but they are better then the last time. she has tried to be more conceus of her posture with gait.     Pertinent History  ICH, seizure, ORIF right ankle 03/13/2012, BiPolar, HTN, Lipoma, obesity, DVT    Limitations  Lifting;Standing;Walking    How long can you stand comfortably?  <4mins     Diagnostic tests  shifting to the right     Patient Stated Goals  "I just dont want to hurt"    Currently in Pain?  Yes    Pain Score  5     Pain Location  Back    Pain Orientation  Right;Left;Lower    Pain Descriptors  / Indicators  Sore    Pain Type  Chronic pain    Pain Onset  More than a month ago    Aggravating Factors   walking     Pain Relieving Factors  sitting     Effect of Pain on Daily Activities  extra time cleaneing                        [facility] Adult PT Treatment/Exercise - 05/27/18 0001      Lumbar Exercises: Stretches   Active Hamstring Stretch  3 reps;30 seconds sitting      Lumbar Exercises: Seated   Other Seated Lumbar Exercises  seated ball squeze 2x10       Shoulder Exercises: Seated   External Rotation  10 reps 2x10 rewd     Other Seated Exercises  setaed ball role x10; to the left for the right x5; ball press x10;     Other Seated Exercises  scap retraction 2x10 with cuing yellow       Modalities   Modalities  Electrical Stimulation;Moist Heat      Moist Heat Therapy  Number Minutes Moist Heat  10 Minutes    Moist Heat Location  Lumbar Spine      Electrical Stimulation   Electrical Stimulation Location  right lumbar spine     Electrical Stimulation Action  IFC     Electrical Stimulation Parameters  to tolerance    Electrical Stimulation Goals  Pain             PT Education - 05/26/18 1430    Education provided  Yes    Person(s) Educated  Patient    Methods  Explanation;Demonstration;Tactile cues;Verbal cues    Comprehension  Verbalized understanding;Returned demonstration;Verbal cues required;Tactile cues required       PT Short Term Goals - 05/15/18 1229      PT SHORT TERM GOAL #1   Title  Patient will increase right active shoulder felxion by 25 degrees    Baseline  decreased shoulder flexion     Time  4    Period  Weeks    Status  On-going      PT SHORT TERM GOAL #2   Title  Patient will increase lumbar range of motion by 25%     Baseline  less motion     Time  4    Period  Weeks    Status  On-going      PT SHORT TERM GOAL #3   Title  Patient will increase right hip flexion to 100 degrees with < 2/10 pain     Baseline  pain  with hip flexion in sitting 90+ degrees with use of hands    Time  4    Period  Weeks    Status  On-going      PT SHORT TERM GOAL #4   Title  Patient will increase bilateral hip strength to 4+/5 gross     Time  4    Period  Weeks    Status  On-going        PT Long Term Goals - 05/15/18 1230      PT LONG TERM GOAL #1   Title  Patient demonstrates & verbalizes understanding of ongoing HEP / fitness plan without a significant increase in pain     Time  8    Period  Weeks    Status  On-going      PT LONG TERM GOAL #2   Title  Patient will go stand for 30 minutes without self report of increased pain in order to improve ability to perfrom ADL's     Time  8    Period  Weeks    Status  On-going      PT LONG TERM GOAL #3   Title  Patient will increase lumbar spine motion by 50% in order to bend down and put her shoes on    Time  8    Period  Weeks    Status  On-going      PT LONG TERM GOAL #4   Title  Patient will demonstrate a 45% limitation on FOTO     Baseline  55 % limitation     Time  8    Period  Weeks    Status  On-going            Plan - 05/26/18 1533    Clinical Impression Statement  Patient given light scpaular stengthening exercises to work on her posture. She has been more aware of the posture iin the past few days. She was able to sit up  straighter this visit without cuing. The patient also perfromed Therapy also added e-stim and heat. She reported improved pain after heat.     Clinical Presentation  Evolving    Clinical Decision Making  Moderate    Rehab Potential  Good    PT Frequency  2x / week    PT Duration  8 weeks    PT Treatment/Interventions  Gait training;Balance training;Patient/family education;ADLs/Self Care Home Management;Cryotherapy;Electrical Stimulation;Iontophoresis 4mg /ml Dexamethasone;Moist Heat;Traction;Ultrasound;Stair training;Functional mobility training;Therapeutic activities;Therapeutic exercise;Manual techniques;Passive range of  motion;Dry needling;Taping    PT Next Visit Plan  review Hamstring stretch ,  isometric abdominals.  ,  may need to sit for lumbar stabilization,  figure 4 stretch as able, R  Likes Nu step. consider going through ADL's with back pain     PT Home Exercise Plan  Pendulums; Knee to chest stretch in sidelying; seated hamstring stretch abdominal isometrics.  Shoulder ER, yellow band  Isometric abdominals. Tennis ball for soft tissue work     Oncologist with Plan of Care  Patient       Patient will benefit from skilled therapeutic intervention in order to improve the following deficits and impairments:  Abnormal gait, Pain, Increased muscle spasms, Decreased endurance, Decreased strength, Decreased range of motion  Visit Diagnosis: Chronic bilateral low back pain with bilateral sciatica  Muscle weakness (generalized)     Problem List Patient Active Problem List   Diagnosis Date Noted  . Leg swelling 03/22/2018  . Notalgia 02/11/2018  . Anemia 02/11/2018  . Chronic right shoulder pain 01/21/2018  . Chronic bilateral low back pain with left-sided sciatica 12/14/2017  . History of stroke 08/13/2017  . Irregular menstrual cycle 08/13/2017  . Iron deficiency anemia 12/12/2016  . Seizure ([place])   . ICH (intracerebral hemorrhage) ([place]) 11/08/2016  . Encounter for weight loss counseling 06/09/2016  . Chronic obstructive pulmonary disease (COPD) ([place]) 05/10/2016  . History of alcohol dependence ([place]) 01/11/2016  . URI (upper respiratory infection) 02/24/2014  . Left leg DVT ([place]) 09/29/2013  . Pre-diabetes 03/02/2013  . History of pulmonary embolism 05/05/2012  . Painful menstrual periods 01/05/2012  . Bipolar disorder ([place]) 07/03/2010  . History of tobacco abuse 08/23/2009  . Hyperlipidemia 04/30/2007  . Class 3 severe obesity due to excess calories with serious comorbidity and body mass index (BMI) of 50.0 to 59.9 in adult ([place]) 01/01/2007  . HYPERTENSION, BENIGN SYSTEMIC  01/01/2007    Carney Living PT DPT  05/27/2018, 1:49 PM  [facility] [address] [place], [place], [address] Phone: [phone]   Fax:  [phone]  Name: Carla Hanson MRN: [id number] Date of Birth: 05-08-1966

## 2018-05-28 ENCOUNTER — Encounter: Payer: Self-pay | Admitting: Physical Therapy

## 2018-05-28 ENCOUNTER — Ambulatory Visit: Payer: Self-pay | Admitting: Physical Therapy

## 2018-05-28 DIAGNOSIS — M25511 Pain in right shoulder: Secondary | ICD-10-CM

## 2018-05-28 DIAGNOSIS — M5441 Lumbago with sciatica, right side: Principal | ICD-10-CM

## 2018-05-28 DIAGNOSIS — G8929 Other chronic pain: Secondary | ICD-10-CM

## 2018-05-28 DIAGNOSIS — M25611 Stiffness of right shoulder, not elsewhere classified: Secondary | ICD-10-CM

## 2018-05-28 DIAGNOSIS — R262 Difficulty in walking, not elsewhere classified: Secondary | ICD-10-CM

## 2018-05-28 DIAGNOSIS — M6281 Muscle weakness (generalized): Secondary | ICD-10-CM

## 2018-05-28 DIAGNOSIS — M5442 Lumbago with sciatica, left side: Principal | ICD-10-CM

## 2018-05-28 NOTE — Therapy (Signed)
[facility] [place], [place], [address] Phone: [phone]   Fax:  [phone]  Physical Therapy Treatment  Patient Details  Name: Carla Hanson MRN: [id number] Date of Birth: 08-05-1966 Referring Provider: Dr Tye Savoy Fitegerald   Encounter Date: 05/28/2018  PT End of Session - 05/28/18 1802    Visit Number  7    Number of Visits  16    Date for PT Re-Evaluation  04/29/18    PT Start Time  0623 short session due to patient late and had heat    PT Stop Time  1515    PT Time Calculation (min)  50 min    Activity Tolerance  Patient tolerated treatment well    Behavior During Therapy  [facility] for tasks assessed/performed       Past Medical History:  Diagnosis Date  . Anxiety   . Bipolar disorder ([place])   . Hyperlipidemia   . Hypertension   . Lipoma    Abdomen  . LIPOMA 01/20/2008  . Obesity   . Pneumonia   . Tobacco abuse     Past Surgical History:  Procedure Laterality Date  . CESAREAN SECTION    . CYSTECTOMY    . LIPOMA EXCISION  03/2011  . ORIF ANKLE FRACTURE  03/13/2012   Procedure: OPEN REDUCTION INTERNAL FIXATION (ORIF) ANKLE FRACTURE;  Surgeon: Jessy Oto, MD;  Location: [facility];  Service: Orthopedics;  Laterality: Right;    There were no vitals filed for this visit.  Subjective Assessment - 05/28/18 1515    Subjective  Does her exercises 2 x  a day.  2nd set hurts  ,  I am able to ease pain with stretching.  I have been working on my posture.  I know I bend over to reach down.  My grandson weighs 40 pounds.     Patient is accompained by:  --    Currently in Pain?  Yes    Aggravating Factors   walking, bending over,  dish washing,  doing things with grandson.    Pain Relieving Factors  sitting,  correcting my posture    Effect of Pain on Daily Activities  limits activities    Pain Score  -- mild moderate right knee                       [facility] Adult PT Treatment/Exercise -  05/28/18 0001      Self-Care   Self-Care  ADL's;Posture    Posture  entire session spent in ADL education.  handout reviewed.  Modification suggestions were multiple ,  Patient was open to changing techniques.       Moist Heat Therapy   Number Minutes Moist Heat  15 Minutes    Moist Heat Location  Lumbar Spine sitting             PT Education - 05/28/18 1801    Education provided  Yes    Education Details  ADL/  posture    Person(s) Educated  Patient    Methods  Explanation;Demonstration;Verbal cues;Handout    Comprehension  Verbalized understanding       PT Short Term Goals - 05/15/18 1229      PT SHORT TERM GOAL #1   Title  Patient will increase right active shoulder felxion by 25 degrees    Baseline  decreased shoulder flexion     Time  4    Period  Weeks  Status  On-going      PT SHORT TERM GOAL #2   Title  Patient will increase lumbar range of motion by 25%     Baseline  less motion     Time  4    Period  Weeks    Status  On-going      PT SHORT TERM GOAL #3   Title  Patient will increase right hip flexion to 100 degrees with < 2/10 pain     Baseline  pain with hip flexion in sitting 90+ degrees with use of hands    Time  4    Period  Weeks    Status  On-going      PT SHORT TERM GOAL #4   Title  Patient will increase bilateral hip strength to 4+/5 gross     Time  4    Period  Weeks    Status  On-going        PT Long Term Goals - 05/15/18 1230      PT LONG TERM GOAL #1   Title  Patient demonstrates & verbalizes understanding of ongoing HEP / fitness plan without a significant increase in pain     Time  8    Period  Weeks    Status  On-going      PT LONG TERM GOAL #2   Title  Patient will go stand for 30 minutes without self report of increased pain in order to improve ability to perfrom ADL's     Time  8    Period  Weeks    Status  On-going      PT LONG TERM GOAL #3   Title  Patient will increase lumbar spine motion by 50% in order to  bend down and put her shoes on    Time  8    Period  Weeks    Status  On-going      PT LONG TERM GOAL #4   Title  Patient will demonstrate a 45% limitation on FOTO     Baseline  55 % limitation     Time  8    Period  Weeks    Status  On-going            Plan - 05/28/18 1805    Clinical Impression Statement  Education focus. Multiple modifications suggested.  Patient receptive to suggestions.     PT Next Visit Plan  review Hamstring stretch ,  isometric abdominals.  ,  may need to sit for lumbar stabilization,  figure 4 stretch as able, R  Likes Nu step. Answer any ADL/posture questions.    PT Home Exercise Plan  Pendulums; Knee to chest stretch in sidelying; seated hamstring stretch abdominal isometrics.  Shoulder ER, yellow band  Isometric abdominals. Tennis ball for soft tissue work     Oncologist with Plan of Care  Patient       Patient will benefit from skilled therapeutic intervention in order to improve the following deficits and impairments:     Visit Diagnosis: Chronic bilateral low back pain with bilateral sciatica  Muscle weakness (generalized)  Acute pain of right shoulder  Stiffness of right shoulder, not elsewhere classified  Difficulty in walking, not elsewhere classified     Problem List Patient Active Problem List   Diagnosis Date Noted  . Leg swelling 03/22/2018  . Notalgia 02/11/2018  . Anemia 02/11/2018  . Chronic right shoulder pain 01/21/2018  . Chronic bilateral low back pain  with left-sided sciatica 12/14/2017  . History of stroke 08/13/2017  . Irregular menstrual cycle 08/13/2017  . Iron deficiency anemia 12/12/2016  . Seizure ([place])   . ICH (intracerebral hemorrhage) ([place]) 11/08/2016  . Encounter for weight loss counseling 06/09/2016  . Chronic obstructive pulmonary disease (COPD) ([place]) 05/10/2016  . History of alcohol dependence ([place]) 01/11/2016  . URI (upper respiratory infection) 02/24/2014  . Left leg DVT ([place])  09/29/2013  . Pre-diabetes 03/02/2013  . History of pulmonary embolism 05/05/2012  . Painful menstrual periods 01/05/2012  . Bipolar disorder ([place]) 07/03/2010  . History of tobacco abuse 08/23/2009  . Hyperlipidemia 04/30/2007  . Class 3 severe obesity due to excess calories with serious comorbidity and body mass index (BMI) of 50.0 to 59.9 in adult ([place]) 01/01/2007  . HYPERTENSION, BENIGN SYSTEMIC 01/01/2007    Meyli Boice PTA 05/28/2018, 6:09 PM  [facility] [address] [place], [place], [address] Phone: [phone]   Fax:  [phone]  Name: ANNABEL GIBEAU MRN: [id number] Date of Birth: 01/11/1966

## 2018-05-28 NOTE — Patient Instructions (Signed)

## 2018-06-02 ENCOUNTER — Ambulatory Visit: Payer: Self-pay | Admitting: Physical Therapy

## 2018-06-02 DIAGNOSIS — M25611 Stiffness of right shoulder, not elsewhere classified: Secondary | ICD-10-CM

## 2018-06-02 DIAGNOSIS — M6281 Muscle weakness (generalized): Secondary | ICD-10-CM

## 2018-06-02 DIAGNOSIS — M5442 Lumbago with sciatica, left side: Principal | ICD-10-CM

## 2018-06-02 DIAGNOSIS — R262 Difficulty in walking, not elsewhere classified: Secondary | ICD-10-CM

## 2018-06-02 DIAGNOSIS — G8929 Other chronic pain: Secondary | ICD-10-CM

## 2018-06-02 DIAGNOSIS — M5441 Lumbago with sciatica, right side: Principal | ICD-10-CM

## 2018-06-02 DIAGNOSIS — M25511 Pain in right shoulder: Secondary | ICD-10-CM

## 2018-06-02 NOTE — Therapy (Signed)
[place], [place], [address] Phone: [phone]   Fax:  [phone]  Physical Therapy Treatment  Patient Details  Name: Carla Hanson MRN: [id number] Date of Birth: 1966/05/19 Referring Provider: Dr Tye Savoy Fitegerald   Encounter Date: 06/02/2018  PT End of Session - 06/02/18 1418    Visit Number  8    Number of Visits  16    Date for PT Re-Evaluation  06/12/18    Authorization Type  Self-pay     PT Start Time  1415    PT Stop Time  1456    PT Time Calculation (min)  41 min    Activity Tolerance  Patient tolerated treatment well    Behavior During Therapy  [facility] for tasks assessed/performed       Past Medical History:  Diagnosis Date  . Anxiety   . Bipolar disorder ([place])   . Hyperlipidemia   . Hypertension   . Lipoma    Abdomen  . LIPOMA 01/20/2008  . Obesity   . Pneumonia   . Tobacco abuse     Past Surgical History:  Procedure Laterality Date  . CESAREAN SECTION    . CYSTECTOMY    . LIPOMA EXCISION  03/2011  . ORIF ANKLE FRACTURE  03/13/2012   Procedure: OPEN REDUCTION INTERNAL FIXATION (ORIF) ANKLE FRACTURE;  Surgeon: Jessy Oto, MD;  Location: [facility];  Service: Orthopedics;  Laterality: Right;    There were no vitals filed for this visit.  Subjective Assessment - 06/02/18 1418    Subjective  Patient reports today is a good day. her pain level is about a 2/10. She has been working on her exercises. She has also been walking further with her grandson.     Pertinent History  ICH, seizure, ORIF right ankle 03/13/2012, BiPolar, HTN, Lipoma, obesity, DVT    Limitations  Lifting;Standing;Walking    How long can you stand comfortably?  <16mins     Diagnostic tests  shifting to the right     Patient Stated Goals  "I just dont want to hurt"    Currently in Pain?  Yes    Pain Score  2     Pain Location  Back    Pain Orientation  Right;Left;Lower    Pain Descriptors / Indicators  Sore     Pain Type  Chronic pain    Pain Onset  More than a month ago    Pain Frequency  Constant    Aggravating Factors   walking, bending over, dish wishing doing things     Pain Relieving Factors  sitting and correcting posture     Effect of Pain on Daily Activities  limits activity                        [facility] Adult PT Treatment/Exercise - 06/02/18 0001      Lumbar Exercises: Stretches   Active Hamstring Stretch  3 reps;30 seconds sitting      Shoulder Exercises: Seated   Other Seated Exercises  seated ball role x10; to the left for the right x5; ball press x10;       Shoulder Exercises: Standing   Other Standing Exercises  scap retraction x10; scap extension x10;       Moist Heat Therapy   Number Minutes Moist Heat  10 Minutes    Moist Heat Location  Lumbar Spine      Manual Therapy  Manual therapy comments  seated resting on ball trigger point release to r lumbar parapsinals with tennis ball                PT Short Term Goals - 05/15/18 1229      PT SHORT TERM GOAL #1   Title  Patient will increase right active shoulder felxion by 25 degrees    Baseline  decreased shoulder flexion     Time  4    Period  Weeks    Status  On-going      PT SHORT TERM GOAL #2   Title  Patient will increase lumbar range of motion by 25%     Baseline  less motion     Time  4    Period  Weeks    Status  On-going      PT SHORT TERM GOAL #3   Title  Patient will increase right hip flexion to 100 degrees with < 2/10 pain     Baseline  pain with hip flexion in sitting 90+ degrees with use of hands    Time  4    Period  Weeks    Status  On-going      PT SHORT TERM GOAL #4   Title  Patient will increase bilateral hip strength to 4+/5 gross     Time  4    Period  Weeks    Status  On-going        PT Long Term Goals - 05/15/18 1230      PT LONG TERM GOAL #1   Title  Patient demonstrates & verbalizes understanding of ongoing HEP / fitness plan without a  significant increase in pain     Time  8    Period  Weeks    Status  On-going      PT LONG TERM GOAL #2   Title  Patient will go stand for 30 minutes without self report of increased pain in order to improve ability to perfrom ADL's     Time  8    Period  Weeks    Status  On-going      PT LONG TERM GOAL #3   Title  Patient will increase lumbar spine motion by 50% in order to bend down and put her shoes on    Time  8    Period  Weeks    Status  On-going      PT LONG TERM GOAL #4   Title  Patient will demonstrate a 45% limitation on FOTO     Baseline  55 % limitation     Time  8    Period  Weeks    Status  On-going            Plan - 06/02/18 1426    Clinical Impression Statement  Patient is making progress. She was able to perfrom exercises without increased pain. Therapy reviewed how to turn upper extremity exercises into core exercises as well.     Clinical Presentation  Evolving    Clinical Decision Making  Moderate    Rehab Potential  Good    PT Frequency  2x / week    PT Duration  8 weeks    PT Treatment/Interventions  Gait training;Balance training;Patient/family education;ADLs/Self Care Home Management;Cryotherapy;Electrical Stimulation;Iontophoresis 4mg /ml Dexamethasone;Moist Heat;Traction;Ultrasound;Stair training;Functional mobility training;Therapeutic activities;Therapeutic exercise;Manual techniques;Passive range of motion;Dry needling;Taping    PT Next Visit Plan  review Hamstring stretch ,  isometric abdominals.  ,  may need  to sit for lumbar stabilization,  figure 4 stretch as able, R  Likes Nu step. Answer any ADL/posture questions.    PT Home Exercise Plan  Pendulums; Knee to chest stretch in sidelying; seated hamstring stretch abdominal isometrics.  Shoulder ER, yellow band  Isometric abdominals. Tennis ball for soft tissue work     Oncologist with Plan of Care  Patient       Patient will benefit from skilled therapeutic intervention in order to  improve the following deficits and impairments:  Abnormal gait, Pain, Increased muscle spasms, Decreased endurance, Decreased strength, Decreased range of motion  Visit Diagnosis: Chronic bilateral low back pain with bilateral sciatica  Muscle weakness (generalized)  Acute pain of right shoulder  Stiffness of right shoulder, not elsewhere classified  Difficulty in walking, not elsewhere classified     Problem List Patient Active Problem List   Diagnosis Date Noted  . Leg swelling 03/22/2018  . Notalgia 02/11/2018  . Anemia 02/11/2018  . Chronic right shoulder pain 01/21/2018  . Chronic bilateral low back pain with left-sided sciatica 12/14/2017  . History of stroke 08/13/2017  . Irregular menstrual cycle 08/13/2017  . Iron deficiency anemia 12/12/2016  . Seizure ([place])   . ICH (intracerebral hemorrhage) ([place]) 11/08/2016  . Encounter for weight loss counseling 06/09/2016  . Chronic obstructive pulmonary disease (COPD) ([place]) 05/10/2016  . History of alcohol dependence ([place]) 01/11/2016  . URI (upper respiratory infection) 02/24/2014  . Left leg DVT ([place]) 09/29/2013  . Pre-diabetes 03/02/2013  . History of pulmonary embolism 05/05/2012  . Painful menstrual periods 01/05/2012  . Bipolar disorder ([place]) 07/03/2010  . History of tobacco abuse 08/23/2009  . Hyperlipidemia 04/30/2007  . Class 3 severe obesity due to excess calories with serious comorbidity and body mass index (BMI) of 50.0 to 59.9 in adult ([place]) 01/01/2007  . HYPERTENSION, BENIGN SYSTEMIC 01/01/2007    Carney Living PT DPT  06/02/2018, 4:08 PM  [facility] [address] [place], [place], [address] Phone: [phone]   Fax:  [phone]  Name: ZUNAIRA LAMY MRN: [id number] Date of Birth: Apr 30, 1966

## 2018-06-04 ENCOUNTER — Encounter: Payer: Self-pay | Admitting: Physical Therapy

## 2018-06-04 ENCOUNTER — Ambulatory Visit: Payer: Self-pay | Attending: Family Medicine | Admitting: Physical Therapy

## 2018-06-04 DIAGNOSIS — R2681 Unsteadiness on feet: Secondary | ICD-10-CM | POA: Insufficient documentation

## 2018-06-04 DIAGNOSIS — M5441 Lumbago with sciatica, right side: Secondary | ICD-10-CM | POA: Insufficient documentation

## 2018-06-04 DIAGNOSIS — M5442 Lumbago with sciatica, left side: Secondary | ICD-10-CM | POA: Insufficient documentation

## 2018-06-04 DIAGNOSIS — M25511 Pain in right shoulder: Secondary | ICD-10-CM | POA: Insufficient documentation

## 2018-06-04 DIAGNOSIS — M6281 Muscle weakness (generalized): Secondary | ICD-10-CM | POA: Insufficient documentation

## 2018-06-04 DIAGNOSIS — R262 Difficulty in walking, not elsewhere classified: Secondary | ICD-10-CM | POA: Insufficient documentation

## 2018-06-04 DIAGNOSIS — G8929 Other chronic pain: Secondary | ICD-10-CM | POA: Insufficient documentation

## 2018-06-04 DIAGNOSIS — M25611 Stiffness of right shoulder, not elsewhere classified: Secondary | ICD-10-CM | POA: Insufficient documentation

## 2018-06-04 NOTE — Therapy (Signed)
[place], [place], [address] Phone: [phone]   Fax:  [phone]  Physical Therapy Treatment  Patient Details  Name: Carla Hanson MRN: [id number] Date of Birth: April 30, 1966 Referring Provider: Dr Tye Savoy Fitegerald   Encounter Date: 06/04/2018  PT End of Session - 06/04/18 1511    Visit Number  9    Number of Visits  16    Date for PT Re-Evaluation  06/12/18    PT Start Time  1324 short session patient late.      PT Stop Time  1502    PT Time Calculation (min)  36 min    Activity Tolerance  Patient tolerated treatment well    Behavior During Therapy  [facility] for tasks assessed/performed       Past Medical History:  Diagnosis Date  . Anxiety   . Bipolar disorder ([place])   . Hyperlipidemia   . Hypertension   . Lipoma    Abdomen  . LIPOMA 01/20/2008  . Obesity   . Pneumonia   . Tobacco abuse     Past Surgical History:  Procedure Laterality Date  . CESAREAN SECTION    . CYSTECTOMY    . LIPOMA EXCISION  03/2011  . ORIF ANKLE FRACTURE  03/13/2012   Procedure: OPEN REDUCTION INTERNAL FIXATION (ORIF) ANKLE FRACTURE;  Surgeon: Jessy Oto, MD;  Location: [facility];  Service: Orthopedics;  Laterality: Right;    There were no vitals filed for this visit.  Subjective Assessment - 06/04/18 1432    Subjective  Today is another good day.  She has been doing the exercises.  She is able to walk 30 minutes.  I do the strenght training 3 days a week.  Slept good last night.    Currently in Pain?  Yes    Pain Score  4     Pain Location  Back    Pain Orientation  Right;Left;Lower    Pain Descriptors / Indicators  Sore    Pain Type  Chronic pain    Pain Frequency  Intermittent    Pain Score  -- mild    Pain Location  Shoulder    Pain Orientation  Right;Left    Pain Type  Chronic pain    Pain Frequency  Constant    Aggravating Factors   ADL's , cleaning    Pain Relieving Factors  rest          [facility] PT Assessment - 06/04/18 0001      AROM   Right Shoulder Flexion  105 Degrees extra time                   [facility] Adult PT Treatment/Exercise - 06/04/18 0001      Lumbar Exercises: Aerobic   Nustep  8 minutes  L4  UE/LE      Lumbar Exercises: Seated   Other Seated Lumbar Exercises  March with abdominals  HEP  pain in back improved with core activation.      Shoulder Exercises: Seated   Flexion  AROM    Other Seated Exercises  seated ball role x10; to the left for the right x5;     Other Seated Exercises  Abdominal isometrics both and single .             PT Education - 06/04/18 1510    Education provided  Yes    Education Details  HEP    Person(s) Educated  Patient  Methods  Explanation;Demonstration;Verbal cues;Handout    Comprehension  Verbalized understanding;Returned demonstration       PT Short Term Goals - 06/04/18 1753      PT SHORT TERM GOAL #1   Title  Patient will increase right active shoulder felxion by 25 degrees    Baseline  105 AROM    Time  4    Period  Weeks    Status  Partially Met      PT SHORT TERM GOAL #2   Title  Patient will increase lumbar range of motion by 25%     Time  4    Period  Weeks    Status  Unable to assess      PT SHORT TERM GOAL #3   Title  Patient will increase right hip flexion to 100 degrees with < 2/10 pain     Baseline  not getting to 90 degrees with sitting hip flexion,  did not use hands  some pain not assessed    Time  4    Period  Weeks    Status  On-going      PT SHORT TERM GOAL #4   Title  Patient will increase bilateral hip strength to 4+/5 gross     Time  4    Period  Weeks    Status  Unable to assess        PT Long Term Goals - 05/15/18 1230      PT LONG TERM GOAL #1   Title  Patient demonstrates & verbalizes understanding of ongoing HEP / fitness plan without a significant increase in pain     Time  8    Period  Weeks    Status  On-going      PT LONG TERM GOAL #2    Title  Patient will go stand for 30 minutes without self report of increased pain in order to improve ability to perfrom ADL's     Time  8    Period  Weeks    Status  On-going      PT LONG TERM GOAL #3   Title  Patient will increase lumbar spine motion by 50% in order to bend down and put her shoes on    Time  8    Period  Weeks    Status  On-going      PT LONG TERM GOAL #4   Title  Patient will demonstrate a 45% limitation on FOTO     Baseline  55 % limitation     Time  8    Period  Weeks    Status  On-going            Plan - 06/04/18 1750    Clinical Impression Statement  Short session due to patient late.    STG#1 partially met.  She was able to AROM shoulder flexion to 105 with extra time.  She had mild increases during exercises today which eased with rest.     PT Next Visit Plan  review Hamstring stretch and sitting march exercise ,  isometric abdominals.  ,  may need to sit for lumbar stabilization,  figure 4 stretch as able, R  Likes Nu step.     PT Home Exercise Plan  Pendulums; Knee to chest stretch in sidelying; seated hamstring stretch abdominal isometrics.  Shoulder ER, yellow band  Isometric abdominals. Tennis ball for soft tissue work   Sitting march with abdominals braced.     Consulted and  Agree with Plan of Care  Patient       Patient will benefit from skilled therapeutic intervention in order to improve the following deficits and impairments:     Visit Diagnosis: Chronic bilateral low back pain with bilateral sciatica  Muscle weakness (generalized)  Acute pain of right shoulder  Stiffness of right shoulder, not elsewhere classified  Difficulty in walking, not elsewhere classified     Problem List Patient Active Problem List   Diagnosis Date Noted  . Leg swelling 03/22/2018  . Notalgia 02/11/2018  . Anemia 02/11/2018  . Chronic right shoulder pain 01/21/2018  . Chronic bilateral low back pain with left-sided sciatica 12/14/2017  . History  of stroke 08/13/2017  . Irregular menstrual cycle 08/13/2017  . Iron deficiency anemia 12/12/2016  . Seizure ([place])   . ICH (intracerebral hemorrhage) ([place]) 11/08/2016  . Encounter for weight loss counseling 06/09/2016  . Chronic obstructive pulmonary disease (COPD) ([place]) 05/10/2016  . History of alcohol dependence ([place]) 01/11/2016  . URI (upper respiratory infection) 02/24/2014  . Left leg DVT ([place]) 09/29/2013  . Pre-diabetes 03/02/2013  . History of pulmonary embolism 05/05/2012  . Painful menstrual periods 01/05/2012  . Bipolar disorder ([place]) 07/03/2010  . History of tobacco abuse 08/23/2009  . Hyperlipidemia 04/30/2007  . Class 3 severe obesity due to excess calories with serious comorbidity and body mass index (BMI) of 50.0 to 59.9 in adult ([place]) 01/01/2007  . HYPERTENSION, BENIGN SYSTEMIC 01/01/2007    Arthuro Canelo PTA 06/04/2018, 5:56 PM  [facility] [address] [place], [place], [address] Phone: [phone]   Fax:  [phone]  Name: ANNELYSE REY MRN: [id number] Date of Birth: 1966/06/21

## 2018-06-04 NOTE — Patient Instructions (Signed)
Sit with good posture Tip belly button up Do not move nose March in place 10 x 1-3 X a day

## 2018-06-15 ENCOUNTER — Ambulatory Visit: Payer: Self-pay | Admitting: Physical Therapy

## 2018-06-15 ENCOUNTER — Encounter: Payer: Self-pay | Admitting: Physical Therapy

## 2018-06-15 DIAGNOSIS — M5442 Lumbago with sciatica, left side: Principal | ICD-10-CM

## 2018-06-15 DIAGNOSIS — G8929 Other chronic pain: Secondary | ICD-10-CM

## 2018-06-15 DIAGNOSIS — M5441 Lumbago with sciatica, right side: Principal | ICD-10-CM

## 2018-06-15 DIAGNOSIS — M25511 Pain in right shoulder: Secondary | ICD-10-CM

## 2018-06-15 DIAGNOSIS — M25611 Stiffness of right shoulder, not elsewhere classified: Secondary | ICD-10-CM

## 2018-06-15 DIAGNOSIS — M6281 Muscle weakness (generalized): Secondary | ICD-10-CM

## 2018-06-15 DIAGNOSIS — R262 Difficulty in walking, not elsewhere classified: Secondary | ICD-10-CM

## 2018-06-15 NOTE — Therapy (Signed)
[place], [place], [address] Phone: [phone]   Fax:  [phone]  Physical Therapy Treatment  Patient Details  Name: Carla Hanson MRN: [id number] Date of Birth: [birth year] Referring Provider: Dr Tye Savoy Fitegerald   Encounter Date: 06/15/2018  PT End of Session - 06/15/18 1320    Visit Number  10    Number of Visits  16    Date for PT Re-Evaluation  06/12/18    Authorization Type  Self-pay     PT Start Time  1105   Patient was 5 min late to treatment    PT Stop Time  1143    PT Time Calculation (min)  38 min    Activity Tolerance  Patient tolerated treatment well    Behavior During Therapy  [facility] for tasks assessed/performed       Past Medical History:  Diagnosis Date  . Anxiety   . Bipolar disorder ([place])   . Hyperlipidemia   . Hypertension   . Lipoma    Abdomen  . LIPOMA 01/20/2008  . Obesity   . Pneumonia   . Tobacco abuse     Past Surgical History:  Procedure Laterality Date  . CESAREAN SECTION    . CYSTECTOMY    . LIPOMA EXCISION  03/2011  . ORIF ANKLE FRACTURE  03/13/2012   Procedure: OPEN REDUCTION INTERNAL FIXATION (ORIF) ANKLE FRACTURE;  Surgeon: Jessy Oto, MD;  Location: [facility];  Service: Orthopedics;  Laterality: Right;    There were no vitals filed for this visit.  Subjective Assessment - 06/15/18 1108    Subjective  Patient reports her back is sore this morning. She also reports that her knees are hurting her as well. She reports yesterday her back and knees were around a 10/10.     Pertinent History  ICH, seizure, ORIF right ankle 03/13/2012, BiPolar, HTN, Lipoma, obesity, DVT    Limitations  Lifting;Standing;Walking    How long can you stand comfortably?  <27mns     Diagnostic tests  shifting to the right     Patient Stated Goals  "I just dont want to hurt"    Currently in Pain?  Yes    Pain Score  5     Pain Location  Back    Pain Orientation   Right;Left;Lower    Pain Descriptors / Indicators  Sore    Pain Type  Acute pain;Chronic pain    Pain Radiating Towards  right hip catches     Pain Onset  More than a month ago    Pain Frequency  Intermittent    Aggravating Factors   walking, bending, dish washing     Pain Relieving Factors  sitting and correct postrue     Effect of Pain on Daily Activities  limits activity     Pain Score  6    Pain Location  Knee    Pain Orientation  Right;Left    Pain Descriptors / Indicators  Aching    Pain Onset  More than a month ago    Pain Frequency  Constant    Aggravating Factors   mopping     Pain Relieving Factors  rest    Effect of Pain on Daily Activities  difficulty cleaning her house                        [facility] - [facility]  Lumbar Exercises: Aerobic   Nustep  5 min L3       Lumbar Exercises: Seated   Other Seated Lumbar Exercises  ball squeeze x20 with abdominal breathing; hip abduction red 2x10;       Shoulder Exercises: Seated   Retraction  Limitations    Retraction Limitations  2x10 yellow     Other Seated Exercises  seated ball role x10; to the left for the right x5; bilateral ER yellow 2x10    Other Seated Exercises  Abdominal isometrics both and single .      Moist Heat Therapy   Number Minutes Moist Heat  10 Minutes    Moist Heat Location  Lumbar Spine             PT Education - 06/15/18 1136    Education provided  Yes    Education Details  reviewed HEP; symptom mangement when he is sore.     Person(s) Educated  Patient    Methods  Explanation;Demonstration;Tactile cues;Verbal cues    Comprehension  Verbalized understanding;Returned demonstration;Verbal cues required;Tactile cues required;Need further instruction       PT Short Term Goals - 06/04/18 1753      PT SHORT TERM GOAL #1   Title  Patient will increase right active shoulder felxion by 25 degrees    Baseline  105 AROM    Time  4    Period   Weeks    Status  Partially Met      PT SHORT TERM GOAL #2   Title  Patient will increase lumbar range of motion by 25%     Time  4    Period  Weeks    Status  Unable to assess      PT SHORT TERM GOAL #3   Title  Patient will increase right hip flexion to 100 degrees with < 2/10 pain     Baseline  not getting to 90 degrees with sitting hip flexion,  did not use hands  some pain not assessed    Time  4    Period  Weeks    Status  On-going      PT SHORT TERM GOAL #4   Title  Patient will increase bilateral hip strength to 4+/5 gross     Time  4    Period  Weeks    Status  Unable to assess        PT Long Term Goals - 05/15/18 1230      PT LONG TERM GOAL #1   Title  Patient demonstrates & verbalizes understanding of ongoing HEP / fitness plan without a significant increase in pain     Time  8    Period  Weeks    Status  On-going      PT LONG TERM GOAL #2   Title  Patient will go stand for 30 minutes without self report of increased pain in order to improve ability to perfrom ADL's     Time  8    Period  Weeks    Status  On-going      PT LONG TERM GOAL #3   Title  Patient will increase lumbar spine motion by 50% in order to bend down and put her shoes on    Time  8    Period  Weeks    Status  On-going      PT LONG TERM GOAL #4   Title  Patient will demonstrate a 45% limitation on FOTO  Baseline  55 % limitation     Time  8    Period  Weeks    Status  On-going            Plan - 06/15/18 1322    Clinical Impression Statement  Patient reported improved pain with manual therapy and stretching. She also reported improved pain with heat. She was advised to stretch and use her tennis ball when she is having a bad day. She has been working on walking more at home.     Clinical Presentation  Evolving    Clinical Decision Making  Moderate    Rehab Potential  Good    PT Frequency  2x / week    PT Duration  8 weeks    PT Treatment/Interventions  Gait  training;Balance training;Patient/family education;ADLs/Self Care Home Management;Cryotherapy;Electrical Stimulation;Iontophoresis '4mg'$ /ml Dexamethasone;Moist Heat;Traction;Ultrasound;Stair training;Functional mobility training;Therapeutic activities;Therapeutic exercise;Manual techniques;Passive range of motion;Dry needling;Taping    PT Next Visit Plan  review Hamstring stretch and sitting march exercise ,  isometric abdominals.  ,  may need to sit for lumbar stabilization,  figure 4 stretch as able, R  Likes Nu step.     PT Home Exercise Plan  Pendulums; Knee to chest stretch in sidelying; seated hamstring stretch abdominal isometrics.  Shoulder ER, yellow band  Isometric abdominals. Tennis ball for soft tissue work   Sitting march with abdominals braced.     Consulted and Agree with Plan of Care  Patient       Patient will benefit from skilled therapeutic intervention in order to improve the following deficits and impairments:  Abnormal gait, Pain, Increased muscle spasms, Decreased endurance, Decreased strength, Decreased range of motion  Visit Diagnosis: Chronic bilateral low back pain with bilateral sciatica  Muscle weakness (generalized)  Acute pain of right shoulder  Stiffness of right shoulder, not elsewhere classified  Difficulty in walking, not elsewhere classified     Problem List Patient Active Problem List   Diagnosis Date Noted  . Leg swelling 03/22/2018  . Notalgia 02/11/2018  . Anemia 02/11/2018  . Chronic right shoulder pain 01/21/2018  . Chronic bilateral low back pain with left-sided sciatica 12/14/2017  . History of stroke 08/13/2017  . Irregular menstrual cycle 08/13/2017  . Iron deficiency anemia 12/12/2016  . Seizure ([place])   . ICH (intracerebral hemorrhage) ([place]) 11/08/2016  . Encounter for weight loss counseling 06/09/2016  . Chronic obstructive pulmonary disease (COPD) ([place]) 05/10/2016  . History of alcohol dependence ([place]) 01/11/2016  . URI (upper  respiratory infection) 02/24/2014  . Left leg DVT ([place]) 09/29/2013  . Pre-diabetes 03/02/2013  . History of pulmonary embolism 05/05/2012  . Painful menstrual periods 01/05/2012  . Bipolar disorder ([place]) 07/03/2010  . History of tobacco abuse 08/23/2009  . Hyperlipidemia 04/30/2007  . Class 3 severe obesity due to excess calories with serious comorbidity and body mass index (BMI) of 50.0 to 59.9 in adult ([place]) 01/01/2007  . HYPERTENSION, BENIGN SYSTEMIC 01/01/2007    Carney Living PT DPT  06/15/2018, 1:26 PM  [facility] [address] [place], [place], [address] Phone: [phone]   Fax:  [phone]  Name: Carla Hanson MRN: [id number] Date of Birth: 09-Mar-1966

## 2018-06-16 ENCOUNTER — Telehealth: Payer: Self-pay | Admitting: Family Medicine

## 2018-06-16 NOTE — Telephone Encounter (Signed)
Will forward to MD. Burma Ketcher,CMA  

## 2018-06-16 NOTE — Telephone Encounter (Signed)
Pt would like to know if it is ok to take a multivitamin like geritol. She already an aspirin once a day and an iron pill.  Please advise

## 2018-06-17 NOTE — Telephone Encounter (Signed)
That's fine however if new vitamin also has iron in it would watch out so she is not doubling up on iron which could cause GI upset and constipation.   Lucila Maine, DO PGY-3, [facility] 06/17/2018 10:42 AM

## 2018-06-17 NOTE — Telephone Encounter (Signed)
Patient informed and was planning on stopping the iron pill once starting the multivitamin.  Carla Hanson,CMA

## 2018-06-18 ENCOUNTER — Ambulatory Visit: Payer: Self-pay | Admitting: Physical Therapy

## 2018-06-18 ENCOUNTER — Encounter: Payer: Self-pay | Admitting: Physical Therapy

## 2018-06-18 DIAGNOSIS — M5441 Lumbago with sciatica, right side: Principal | ICD-10-CM

## 2018-06-18 DIAGNOSIS — R262 Difficulty in walking, not elsewhere classified: Secondary | ICD-10-CM

## 2018-06-18 DIAGNOSIS — M6281 Muscle weakness (generalized): Secondary | ICD-10-CM

## 2018-06-18 DIAGNOSIS — M5442 Lumbago with sciatica, left side: Principal | ICD-10-CM

## 2018-06-18 DIAGNOSIS — M25511 Pain in right shoulder: Secondary | ICD-10-CM

## 2018-06-18 DIAGNOSIS — M25611 Stiffness of right shoulder, not elsewhere classified: Secondary | ICD-10-CM

## 2018-06-18 DIAGNOSIS — G8929 Other chronic pain: Secondary | ICD-10-CM

## 2018-06-19 ENCOUNTER — Encounter: Payer: Self-pay | Admitting: Physical Therapy

## 2018-06-19 NOTE — Therapy (Signed)
[place], [place], [address] Phone: [phone]   Fax:  [phone]  Physical Therapy Treatment  Patient Details  Name: Carla Hanson MRN: [id number] Date of Birth: May 29, 1966 Referring Provider: Dr Tye Savoy Fitegerald   Encounter Date: 06/18/2018  PT End of Session - 06/19/18 0738    Visit Number  11    Number of Visits  16    Date for PT Re-Evaluation  06/12/18    Authorization Type  Self-pay     PT Start Time  1434    PT Stop Time  1515    PT Time Calculation (min)  41 min    Activity Tolerance  Patient tolerated treatment well    Behavior During Therapy  [facility] for tasks assessed/performed       Past Medical History:  Diagnosis Date  . Anxiety   . Bipolar disorder ([place])   . Hyperlipidemia   . Hypertension   . Lipoma    Abdomen  . LIPOMA 01/20/2008  . Obesity   . Pneumonia   . Tobacco abuse     Past Surgical History:  Procedure Laterality Date  . CESAREAN SECTION    . CYSTECTOMY    . LIPOMA EXCISION  03/2011  . ORIF ANKLE FRACTURE  03/13/2012   Procedure: OPEN REDUCTION INTERNAL FIXATION (ORIF) ANKLE FRACTURE;  Surgeon: Jessy Oto, MD;  Location: [facility];  Service: Orthopedics;  Laterality: Right;    There were no vitals filed for this visit.  Subjective Assessment - 06/18/18 1437    Subjective  Patient was 17 minutes late to her appointment because she was doing exercises. She was advised that when she comes to PT she is going to be doing exercises as well. She is now in significant pain.     Pertinent History  ICH, seizure, ORIF right ankle 03/13/2012, BiPolar, HTN, Lipoma, obesity, DVT    Limitations  Lifting;Standing;Walking    How long can you stand comfortably?  <37mns     Diagnostic tests  shifting to the right     Patient Stated Goals  "I just dont want to hurt"    Pain Score  10-Worst pain ever    Pain Location  Back    Pain Orientation  Right;Left;Lower    Pain  Descriptors / Indicators  Sore    Pain Type  Acute pain;Chronic pain    Pain Onset  More than a month ago    Pain Frequency  Intermittent    Aggravating Factors   walking, beding, dishwashing     Pain Relieving Factors  aitting with correct posture     Effect of Pain on Daily Activities  limits activity     Pain Orientation  Right;Left    Pain Onset  More than a month ago    Pain Frequency  Constant    Aggravating Factors   mopping     Pain Relieving Factors  rest     Effect of Pain on Daily Activities  difficulty cleaning her house                        [facility] - 06/19/18 0001      Lumbar Exercises: Stretches   Active Hamstring Stretch Limitations  3x20sec     Other Lumbar Stretch Exercise  light rotational stretch 3x20 sec hold     Other Lumbar Stretch Exercise  setaed reach up stretch 3x20 sec  Moist Heat Therapy   Number Minutes Moist Heat  15 Minutes    Moist Heat Location  Lumbar Spine      Electrical Stimulation   Electrical Stimulation Location  right lumbar spine     Electrical Stimulation Action  IFC     Electrical Stimulation Parameters  to tolerance     Electrical Stimulation Goals  Pain      Manual Therapy   Manual therapy comments  seated resting on ball trigger point release to r lumbar parapsinals with tennis ball              PT Education - 06/19/18 0737    Education provided  Yes    Education Details  improtance of moderation and symptom mangement     Person(s) Educated  Patient    Methods  Explanation    Comprehension  Verbalized understanding;Returned demonstration;Verbal cues required;Tactile cues required;Need further instruction       PT Short Term Goals - 06/04/18 1753      PT SHORT TERM GOAL #1   Title  Patient will increase right active shoulder felxion by 25 degrees    Baseline  105 AROM    Time  4    Period  Weeks    Status  Partially Met      PT SHORT TERM GOAL #2   Title   Patient will increase lumbar range of motion by 25%     Time  4    Period  Weeks    Status  Unable to assess      PT SHORT TERM GOAL #3   Title  Patient will increase right hip flexion to 100 degrees with < 2/10 pain     Baseline  not getting to 90 degrees with sitting hip flexion,  did not use hands  some pain not assessed    Time  4    Period  Weeks    Status  On-going      PT SHORT TERM GOAL #4   Title  Patient will increase bilateral hip strength to 4+/5 gross     Time  4    Period  Weeks    Status  Unable to assess        PT Long Term Goals - 05/15/18 1230      PT LONG TERM GOAL #1   Title  Patient demonstrates & verbalizes understanding of ongoing HEP / fitness plan without a significant increase in pain     Time  8    Period  Weeks    Status  On-going      PT LONG TERM GOAL #2   Title  Patient will go stand for 30 minutes without self report of increased pain in order to improve ability to perfrom ADL's     Time  8    Period  Weeks    Status  On-going      PT LONG TERM GOAL #3   Title  Patient will increase lumbar spine motion by 50% in order to bend down and put her shoes on    Time  8    Period  Weeks    Status  On-going      PT LONG TERM GOAL #4   Title  Patient will demonstrate a 45% limitation on FOTO     Baseline  55 % limitation     Time  8    Period  Weeks    Status  On-going  Plan - 06/19/18 0739    Clinical Impression Statement  Patient did not tolerate light activity well today. She was strongly advised not to exercise hard right before therapy. Therapy perfromed manual trigger pointrelease in sitting with a tennis ball. She was also given e-stim.     History and Personal Factors relevant to plan of care:  obesity     Clinical Presentation  Evolving    Clinical Decision Making  Moderate    Rehab Potential  Good    PT Frequency  2x / week    PT Duration  8 weeks    PT Treatment/Interventions  Gait training;Balance  training;Patient/family education;ADLs/Self Care Home Management;Cryotherapy;Electrical Stimulation;Iontophoresis 60m/ml Dexamethasone;Moist Heat;Traction;Ultrasound;Stair training;Functional mobility training;Therapeutic activities;Therapeutic exercise;Manual techniques;Passive range of motion;Dry needling;Taping    PT Next Visit Plan  review Hamstring stretch and sitting march exercise ,  isometric abdominals.  ,  may need to sit for lumbar stabilization,  figure 4 stretch as able, R  Likes Nu step.     PT Home Exercise Plan  Pendulums; Knee to chest stretch in sidelying; seated hamstring stretch abdominal isometrics.  Shoulder ER, yellow band  Isometric abdominals. Tennis ball for soft tissue work   Sitting march with abdominals braced.     Consulted and Agree with Plan of Care  Patient       Patient will benefit from skilled therapeutic intervention in order to improve the following deficits and impairments:  Abnormal gait, Pain, Increased muscle spasms, Decreased endurance, Decreased strength, Decreased range of motion  Visit Diagnosis: Chronic bilateral low back pain with bilateral sciatica  Muscle weakness (generalized)  Acute pain of right shoulder  Stiffness of right shoulder, not elsewhere classified  Difficulty in walking, not elsewhere classified     Problem List Patient Active Problem List   Diagnosis Date Noted  . Leg swelling 03/22/2018  . Notalgia 02/11/2018  . Anemia 02/11/2018  . Chronic right shoulder pain 01/21/2018  . Chronic bilateral low back pain with left-sided sciatica 12/14/2017  . History of stroke 08/13/2017  . Irregular menstrual cycle 08/13/2017  . Iron deficiency anemia 12/12/2016  . Seizure ([place]   . ICH (intracerebral hemorrhage) ([place] 11/08/2016  . Encounter for weight loss counseling 06/09/2016  . Chronic obstructive pulmonary disease (COPD) ([place] 05/10/2016  . History of alcohol dependence ([place] 01/11/2016  . URI (upper respiratory  infection) 02/24/2014  . Left leg DVT ([place] 09/29/2013  . Pre-diabetes 03/02/2013  . History of pulmonary embolism 05/05/2012  . Painful menstrual periods 01/05/2012  . Bipolar disorder ([place] 07/03/2010  . History of tobacco abuse 08/23/2009  . Hyperlipidemia 04/30/2007  . Class 3 severe obesity due to excess calories with serious comorbidity and body mass index (BMI) of 50.0 to 59.9 in adult ([place] 01/01/2007  . HYPERTENSION, BENIGN SYSTEMIC 01/01/2007    DCarney LivingPT DPT  06/19/2018, 7:51 AM  [facility]GAir[address] [place] [address]Phone: [phone]  Fax:  [phone] Name: AAYEN VIVIANOMRN: [id number] of Birth: 1October 17, 1967

## 2018-06-23 ENCOUNTER — Ambulatory Visit: Payer: Self-pay | Admitting: Physical Therapy

## 2018-06-23 DIAGNOSIS — M5441 Lumbago with sciatica, right side: Principal | ICD-10-CM

## 2018-06-23 DIAGNOSIS — M6281 Muscle weakness (generalized): Secondary | ICD-10-CM

## 2018-06-23 DIAGNOSIS — M25511 Pain in right shoulder: Secondary | ICD-10-CM

## 2018-06-23 DIAGNOSIS — R262 Difficulty in walking, not elsewhere classified: Secondary | ICD-10-CM

## 2018-06-23 DIAGNOSIS — M5442 Lumbago with sciatica, left side: Principal | ICD-10-CM

## 2018-06-23 DIAGNOSIS — G8929 Other chronic pain: Secondary | ICD-10-CM

## 2018-06-23 DIAGNOSIS — M25611 Stiffness of right shoulder, not elsewhere classified: Secondary | ICD-10-CM

## 2018-06-24 ENCOUNTER — Encounter: Payer: Self-pay | Admitting: Physical Therapy

## 2018-06-24 NOTE — Therapy (Signed)
[place], [place], [address] Phone: [phone]   Fax:  [phone]  Physical Therapy Treatment/ Progress Note   Patient Details  Name: Carla Hanson MRN: [id number] Date of Birth: [birth year] Referring Provider: Dr Tye Savoy Fitegerald   Encounter Date: 06/23/2018  PT End of Session - 06/23/18 1439    Visit Number  12    Number of Visits  16    Date for PT Re-Evaluation  07/22/18    Authorization Type  Self-pay     PT Start Time  1416    PT Stop Time  1513    PT Time Calculation (min)  57 min    Activity Tolerance  Patient tolerated treatment well    Behavior During Therapy  [facility] for tasks assessed/performed       Past Medical History:  Diagnosis Date  . Anxiety   . Bipolar disorder ([place])   . Hyperlipidemia   . Hypertension   . Lipoma    Abdomen  . LIPOMA 01/20/2008  . Obesity   . Pneumonia   . Tobacco abuse     Past Surgical History:  Procedure Laterality Date  . CESAREAN SECTION    . CYSTECTOMY    . LIPOMA EXCISION  03/2011  . ORIF ANKLE FRACTURE  03/13/2012   Procedure: OPEN REDUCTION INTERNAL FIXATION (ORIF) ANKLE FRACTURE;  Surgeon: Jessy Oto, MD;  Location: [facility];  Service: Orthopedics;  Laterality: Right;    There were no vitals filed for this visit.  Subjective Assessment - 06/23/18 1422    Subjective  Patient reports that her back is just sore today. She is having knee pain as well. She has been stiff in her knees.     Pertinent History  ICH, seizure, ORIF right ankle 03/13/2012, BiPolar, HTN, Lipoma, obesity, DVT    Limitations  Lifting;Standing;Walking    How long can you stand comfortably?  <58mins     Diagnostic tests  shifting to the right     Patient Stated Goals  "I just dont want to hurt"    Currently in Pain?  Yes    Pain Score  8     Pain Location  Back    Pain Orientation  Right;Left;Lower    Pain Descriptors / Indicators  Aching    Pain Type  Acute  pain;Chronic pain    Pain Onset  More than a month ago    Pain Frequency  Intermittent    Aggravating Factors   walking, bending,dischwashing          [facility] PT Assessment - 06/24/18 0001      Sensation   Additional Comments  Patient is still having numbness into her hips       AROM   Right Shoulder Flexion  84 Degrees    Lumbar Extension  able to come to neutral better       Strength   Right Hip Flexion  3/5    Right Hip ABduction  4/5    Right Hip ADduction  4/5    Left Hip Flexion  4/5    Left Hip ABduction  4/5      Palpation   Palpation comment  cotinued spasming in the lower back                    [facility] Adult PT Treatment/Exercise - 06/24/18 0001      Ambulation/Gait   Gait Comments  continues to have decreasedweight bearing  on the right with significant lateral movement with gait.       Lumbar Exercises: Stretches   Active Hamstring Stretch Limitations  3x20sec       Shoulder Exercises: Seated   Other Seated Exercises  seated ball role x10; to the left for the right x5; bilateral ER yellow 2x10    Other Seated Exercises  bilateral ER 2x10 yellow x10 adduction       Shoulder Exercises: Standing   Other Standing Exercises  scap retraction2 x10 yellow;  scap extension x10 yellow;       Modalities   Modalities  Electrical Stimulation;Moist Research officer, political party Location  right lumbar spine     Electrical Stimulation Action  IFC to tolerance     Electrical Stimulation Goals  Pain      Manual Therapy   Manual therapy comments  seated resting on ball trigger point release to r lumbar parapsinals with tennis ball; attmepted LAD but pstirnt had increased pain and reported cramping              PT Education - 06/23/18 1438    Education provided  Yes    Education Details  reviewed HEP     Person(s) Educated  Patient    Methods  Explanation;Demonstration;Tactile cues;Verbal cues;Handout    Comprehension   Verbalized understanding;Returned demonstration;Verbal cues required;Tactile cues required       PT Short Term Goals - 06/23/18 1442      PT SHORT TERM GOAL #1   Title  Patient will increase right active shoulder felxion by 25 degrees    Baseline  decreased from last visit     Time  4    Period  Weeks    Status  On-going      PT SHORT TERM GOAL #2   Title  Patient will increase lumbar range of motion by 25%     Baseline  improved to 75% with monor pain at end range     Time  4    Period  Weeks    Status  Achieved      PT SHORT TERM GOAL #3   Title  Patient will increase right hip flexion to 100 degrees with < 2/10 pain     Baseline  able to sit without pain at 90 degrees. Unable to assess further 2nd to unable to lie down     Time  4    Period  Weeks    Status  On-going      PT SHORT TERM GOAL #4   Title  Patient will increase bilateral hip strength to 4+/5 gross     Baseline  still weakness in right leower extremity left is progressing. see flow sheet     Time  4    Period  Weeks    Status  On-going        PT Long Term Goals - 05/15/18 1230      PT LONG TERM GOAL #1   Title  Patient demonstrates & verbalizes understanding of ongoing HEP / fitness plan without a significant increase in pain     Time  8    Period  Weeks    Status  On-going      PT LONG TERM GOAL #2   Title  Patient will go stand for 30 minutes without self report of increased pain in order to improve ability to perfrom ADL's     Time  8  Period  Weeks    Status  On-going      PT LONG TERM GOAL #3   Title  Patient will increase lumbar spine motion by 50% in order to bend down and put her shoes on    Time  8    Period  Weeks    Status  On-going      PT LONG TERM GOAL #4   Title  Patient will demonstrate a 45% limitation on FOTO     Baseline  55 % limitation     Time  8    Period  Weeks    Status  On-going            Plan - 06/23/18 1439    Clinical Impression Statement  Patients  strength has improved slightly as well as her lumbar flexion. She has also increased her ability to walk and perfrom exercises. She still requires some education on symptom mangement. At this point the patient has made some progrgress but not enough to justify many more visits. She has a complete HEP. Therapy tried some different positioning for manual therapy but the patient was unbable to tolerate. The patient has this week and next week scheduled. We will use this time to finalize her HEP and work on symptom management and activity managment at home.     Clinical Presentation  Evolving    Rehab Potential  Good    PT Frequency  2x / week    PT Duration  8 weeks    PT Treatment/Interventions  Gait training;Balance training;Patient/family education;ADLs/Self Care Home Management;Cryotherapy;Electrical Stimulation;Iontophoresis 4mg /ml Dexamethasone;Moist Heat;Traction;Ultrasound;Stair training;Functional mobility training;Therapeutic activities;Therapeutic exercise;Manual techniques;Passive range of motion;Dry needling;Taping    PT Next Visit Plan  review Hamstring stretch and sitting march exercise ,  isometric abdominals.  ,  may need to sit for lumbar stabilization,  figure 4 stretch as able, R  Likes Nu step.     PT Home Exercise Plan  Pendulums; Knee to chest stretch in sidelying; seated hamstring stretch abdominal isometrics.  Shoulder ER, yellow band  Isometric abdominals. Tennis ball for soft tissue work   Sitting march with abdominals braced.     Consulted and Agree with Plan of Care  Patient       Patient will benefit from skilled therapeutic intervention in order to improve the following deficits and impairments:  Abnormal gait, Pain, Increased muscle spasms, Decreased endurance, Decreased strength, Decreased range of motion  Visit Diagnosis: Chronic bilateral low back pain with bilateral sciatica - Plan: PT plan of care cert/re-cert  Muscle weakness (generalized) - Plan: PT plan of care  cert/re-cert  Acute pain of right shoulder - Plan: PT plan of care cert/re-cert  Stiffness of right shoulder, not elsewhere classified - Plan: PT plan of care cert/re-cert  Difficulty in walking, not elsewhere classified - Plan: PT plan of care cert/re-cert     Problem List Patient Active Problem List   Diagnosis Date Noted  . Leg swelling 03/22/2018  . Notalgia 02/11/2018  . Anemia 02/11/2018  . Chronic right shoulder pain 01/21/2018  . Chronic bilateral low back pain with left-sided sciatica 12/14/2017  . History of stroke 08/13/2017  . Irregular menstrual cycle 08/13/2017  . Iron deficiency anemia 12/12/2016  . Seizure ([place])   . ICH (intracerebral hemorrhage) ([place]) 11/08/2016  . Encounter for weight loss counseling 06/09/2016  . Chronic obstructive pulmonary disease (COPD) ([place]) 05/10/2016  . History of alcohol dependence ([place]) 01/11/2016  . URI (upper respiratory infection) 02/24/2014  .  Left leg DVT ([place]) 09/29/2013  . Pre-diabetes 03/02/2013  . History of pulmonary embolism 05/05/2012  . Painful menstrual periods 01/05/2012  . Bipolar disorder ([place]) 07/03/2010  . History of tobacco abuse 08/23/2009  . Hyperlipidemia 04/30/2007  . Class 3 severe obesity due to excess calories with serious comorbidity and body mass index (BMI) of 50.0 to 59.9 in adult ([place]) 01/01/2007  . HYPERTENSION, BENIGN SYSTEMIC 01/01/2007    Carney Living 06/24/2018, 11:44 AM  [facility] [address] [place], [place], [address] Phone: [phone]   Fax:  [phone]  Name: BRIAH NARY MRN: [id number] Date of Birth: July 17, 1966

## 2018-06-25 ENCOUNTER — Ambulatory Visit: Payer: Self-pay | Admitting: Physical Therapy

## 2018-06-25 ENCOUNTER — Encounter: Payer: Self-pay | Admitting: Physical Therapy

## 2018-06-25 DIAGNOSIS — R262 Difficulty in walking, not elsewhere classified: Secondary | ICD-10-CM

## 2018-06-25 DIAGNOSIS — M25511 Pain in right shoulder: Secondary | ICD-10-CM

## 2018-06-25 DIAGNOSIS — R2681 Unsteadiness on feet: Secondary | ICD-10-CM

## 2018-06-25 DIAGNOSIS — M5441 Lumbago with sciatica, right side: Principal | ICD-10-CM

## 2018-06-25 DIAGNOSIS — M5442 Lumbago with sciatica, left side: Principal | ICD-10-CM

## 2018-06-25 DIAGNOSIS — G8929 Other chronic pain: Secondary | ICD-10-CM

## 2018-06-25 DIAGNOSIS — M25611 Stiffness of right shoulder, not elsewhere classified: Secondary | ICD-10-CM

## 2018-06-25 DIAGNOSIS — M6281 Muscle weakness (generalized): Secondary | ICD-10-CM

## 2018-06-25 NOTE — Therapy (Signed)
[place], [place], [address] Phone: [phone]   Fax:  [phone]  Physical Therapy Treatment  Patient Details  Name: Carla Hanson MRN: [id number] Date of Birth: [birth year] Referring Provider: Dr Tye Savoy Fitegerald   Encounter Date: 06/25/2018  PT End of Session - 06/25/18 1830    Visit Number  12    Number of Visits  16    Date for PT Re-Evaluation  07/22/18    PT Start Time  2458    PT Stop Time  1520    PT Time Calculation (min)  60 min    Activity Tolerance  Patient tolerated treatment well    Behavior During Therapy  [facility] for tasks assessed/performed       Past Medical History:  Diagnosis Date  . Anxiety   . Bipolar disorder ([place])   . Hyperlipidemia   . Hypertension   . Lipoma    Abdomen  . LIPOMA 01/20/2008  . Obesity   . Pneumonia   . Tobacco abuse     Past Surgical History:  Procedure Laterality Date  . CESAREAN SECTION    . CYSTECTOMY    . LIPOMA EXCISION  03/2011  . ORIF ANKLE FRACTURE  03/13/2012   Procedure: OPEN REDUCTION INTERNAL FIXATION (ORIF) ANKLE FRACTURE;  Surgeon: Jessy Oto, MD;  Location: [facility];  Service: Orthopedics;  Laterality: Right;    There were no vitals filed for this visit.  Subjective Assessment - 06/25/18 1423    Subjective  7/10 back pain .  Does not feel well.     Currently in Pain?  Yes    Pain Score  7     Pain Location  Back    Pain Score  7    Pain Location  Knee    Pain Orientation  Right;Left    Pain Descriptors / Indicators  Aching   feel like they are going to lock up   Pain Type  Chronic pain    Aggravating Factors   walking    Pain Relieving Factors  rest    Effect of Pain on Daily Activities  limited                       [facility] Adult PT Treatment/Exercise - 06/25/18 0001      Ambulation/Gait   Gait Comments  suggested she use a cane to assist walking and decrease pain.  patient has no money and no way  to get cane.  What I can't do , i don't worry about.      Lumbar Exercises: Stretches   Active Hamstring Stretch Limitations  1 X 20    Figure 4 Stretch  2 reps;20 seconds;Limitations   both sitting     Lumbar Exercises: Seated   Other Seated Lumbar Exercises  sitting with purple ball at back:  pelvic tilt    tilt with ball squeeze,  and marching   Other Seated Lumbar Exercises  isometric abdominals double and single hands pressing  10 x each cued breathing.       Knee/Hip Exercises: Seated   Long Arc Quad  10 reps;Right    Heel Slides  10 reps      Shoulder Exercises: Seated   Retraction  10 reps      Moist Heat Therapy   Number Minutes Moist Heat  15 Minutes    Moist Heat Location  Lumbar Spine      Cryotherapy  Number Minutes Cryotherapy  15 Minutes    Cryotherapy Location  Knee   right   Type of Cryotherapy  --   cold pack            PT Education - 06/25/18 1830    Education provided  Yes    Education Details  how to improve pain and gait with cane    Person(s) Educated  Patient    Methods  Explanation    Comprehension  Verbalized understanding       PT Short Term Goals - 06/23/18 1442      PT SHORT TERM GOAL #1   Title  Patient will increase right active shoulder felxion by 25 degrees    Baseline  decreased from last visit     Time  4    Period  Weeks    Status  On-going      PT SHORT TERM GOAL #2   Title  Patient will increase lumbar range of motion by 25%     Baseline  improved to 75% with monor pain at end range     Time  4    Period  Weeks    Status  Achieved      PT SHORT TERM GOAL #3   Title  Patient will increase right hip flexion to 100 degrees with < 2/10 pain     Baseline  able to sit without pain at 90 degrees. Unable to assess further 2nd to unable to lie down     Time  4    Period  Weeks    Status  On-going      PT SHORT TERM GOAL #4   Title  Patient will increase bilateral hip strength to 4+/5 gross     Baseline  still  weakness in right leower extremity left is progressing. see flow sheet     Time  4    Period  Weeks    Status  On-going        PT Long Term Goals - 05/15/18 1230      PT LONG TERM GOAL #1   Title  Patient demonstrates & verbalizes understanding of ongoing HEP / fitness plan without a significant increase in pain     Time  8    Period  Weeks    Status  On-going      PT LONG TERM GOAL #2   Title  Patient will go stand for 30 minutes without self report of increased pain in order to improve ability to perfrom ADL's     Time  8    Period  Weeks    Status  On-going      PT LONG TERM GOAL #3   Title  Patient will increase lumbar spine motion by 50% in order to bend down and put her shoes on    Time  8    Period  Weeks    Status  On-going      PT LONG TERM GOAL #4   Title  Patient will demonstrate a 45% limitation on FOTO     Baseline  55 % limitation     Time  8    Period  Weeks    Status  On-going            Plan - 06/25/18 1834    Clinical Impression Statement  Modalitits helpful.  patient could benifit form the use of a cane.  For financial reasons she will not use one.  Light exercises tolerated today.    PT Next Visit Plan  review Hamstring stretch and sitting march exercise ,  isometric abdominals.  ,  may need to sit for lumbar stabilization,  figure 4 stretch as able, R  Likes Nu step.     PT Home Exercise Plan  Pendulums; Knee to chest stretch in sidelying; seated hamstring stretch abdominal isometrics.  Shoulder ER, yellow band  Isometric abdominals. Tennis ball for soft tissue work   Sitting march with abdominals braced.     Consulted and Agree with Plan of Care  Patient       Patient will benefit from skilled therapeutic intervention in order to improve the following deficits and impairments:     Visit Diagnosis: Chronic bilateral low back pain with bilateral sciatica  Muscle weakness (generalized)  Difficulty in walking, not elsewhere  classified  Unsteadiness on feet  Acute pain of right shoulder  Stiffness of right shoulder, not elsewhere classified     Problem List Patient Active Problem List   Diagnosis Date Noted  . Leg swelling 03/22/2018  . Notalgia 02/11/2018  . Anemia 02/11/2018  . Chronic right shoulder pain 01/21/2018  . Chronic bilateral low back pain with left-sided sciatica 12/14/2017  . History of stroke 08/13/2017  . Irregular menstrual cycle 08/13/2017  . Iron deficiency anemia 12/12/2016  . Seizure ([place])   . ICH (intracerebral hemorrhage) ([place]) 11/08/2016  . Encounter for weight loss counseling 06/09/2016  . Chronic obstructive pulmonary disease (COPD) ([place]) 05/10/2016  . History of alcohol dependence ([place]) 01/11/2016  . URI (upper respiratory infection) 02/24/2014  . Left leg DVT ([place]) 09/29/2013  . Pre-diabetes 03/02/2013  . History of pulmonary embolism 05/05/2012  . Painful menstrual periods 01/05/2012  . Bipolar disorder ([place]) 07/03/2010  . History of tobacco abuse 08/23/2009  . Hyperlipidemia 04/30/2007  . Class 3 severe obesity due to excess calories with serious comorbidity and body mass index (BMI) of 50.0 to 59.9 in adult ([place]) 01/01/2007  . HYPERTENSION, BENIGN SYSTEMIC 01/01/2007    HARRIS,KAREN PTA 06/25/2018, 6:39 PM  [facility] [address] [place], [place], [address] Phone: [phone]   Fax:  [phone]  Name: Carla Hanson MRN: [id number] Date of Birth: [birth year]

## 2018-06-30 ENCOUNTER — Ambulatory Visit: Payer: Self-pay | Admitting: Physical Therapy

## 2018-06-30 ENCOUNTER — Encounter: Payer: Self-pay | Admitting: Physical Therapy

## 2018-06-30 DIAGNOSIS — R262 Difficulty in walking, not elsewhere classified: Secondary | ICD-10-CM

## 2018-06-30 DIAGNOSIS — M5441 Lumbago with sciatica, right side: Principal | ICD-10-CM

## 2018-06-30 DIAGNOSIS — M6281 Muscle weakness (generalized): Secondary | ICD-10-CM

## 2018-06-30 DIAGNOSIS — M5442 Lumbago with sciatica, left side: Principal | ICD-10-CM

## 2018-06-30 DIAGNOSIS — G8929 Other chronic pain: Secondary | ICD-10-CM

## 2018-06-30 NOTE — Therapy (Signed)
[place], [place], [address] Phone: [phone]   Fax:  [phone]  Physical Therapy Treatment  Patient Details  Name: Carla Hanson MRN: [id number] Date of Birth: [birth year] Referring Provider: Dr Tye Savoy Fitegerald   Encounter Date: 06/30/2018  PT End of Session - 06/30/18 1609    Visit Number  13    Number of Visits  16    Date for PT Re-Evaluation  07/22/18    PT Start Time  1332    PT Stop Time  1410    PT Time Calculation (min)  38 min    Activity Tolerance  Patient tolerated treatment well    Behavior During Therapy  [facility] for tasks assessed/performed       Past Medical History:  Diagnosis Date  . Anxiety   . Bipolar disorder ([place])   . Hyperlipidemia   . Hypertension   . Lipoma    Abdomen  . LIPOMA 01/20/2008  . Obesity   . Pneumonia   . Tobacco abuse     Past Surgical History:  Procedure Laterality Date  . CESAREAN SECTION    . CYSTECTOMY    . LIPOMA EXCISION  03/2011  . ORIF ANKLE FRACTURE  03/13/2012   Procedure: OPEN REDUCTION INTERNAL FIXATION (ORIF) ANKLE FRACTURE;  Surgeon: Jessy Oto, MD;  [facility];  Service: Orthopedics;  Laterality: Right;    There were no vitals filed for this visit.  Subjective Assessment - 06/30/18 1335    Subjective  Back sore vs pain  4/10.  I am having a good day, I do have cramps ( going through the change)  I need to do exercises in the chair.      Currently in Pain?  Yes    Pain Score  4     Pain Location  Back    Pain Orientation  Right;Left;Lower    Pain Descriptors / Indicators  Sore    Pain Frequency  Constant    Aggravating Factors   walking any bending,  dishwashing.    Pain Relieving Factors  sitting with correct posture    Pain Score  3    Pain Location  Knee    Pain Orientation  Right;Left;Anterior    Pain Type  Chronic pain    Aggravating Factors   bending    Pain Relieving Factors  rest                        [facility] Adult PT Treatment/Exercise - 06/30/18 0001      Lumbar Exercises: Stretches   Pelvic Tilt  10 reps   sitting     Lumbar Exercises: Seated   Other Seated Lumbar Exercises  tlit with lumbar support  unable due to female cramping    Other Seated Lumbar Exercises  isometric abdominals double and single hands pressing  10 x each cued breathing.       Knee/Hip Exercises: Seated   Long Arc Quad  10 reps   with ball squeeze   Heel Slides  10 reps    Hamstring Curl  10 reps;2 sets    Hamstring Limitations  red band,  both      Ultrasound   Ultrasound Location  knee right anterior lateral    Ultrasound Parameters  100% 1.5 watts /c m2,  1 Mhz    Ultrasound Goals  Pain  PT Education - 06/30/18 1609    Education provided  No       PT Short Term Goals - 06/23/18 1442      PT SHORT TERM GOAL #1   Title  Patient will increase right active shoulder felxion by 25 degrees    Baseline  decreased from last visit     Time  4    Period  Weeks    Status  On-going      PT SHORT TERM GOAL #2   Title  Patient will increase lumbar range of motion by 25%     Baseline  improved to 75% with monor pain at end range     Time  4    Period  Weeks    Status  Achieved      PT SHORT TERM GOAL #3   Title  Patient will increase right hip flexion to 100 degrees with < 2/10 pain     Baseline  able to sit without pain at 90 degrees. Unable to assess further 2nd to unable to lie down     Time  4    Period  Weeks    Status  On-going      PT SHORT TERM GOAL #4   Title  Patient will increase bilateral hip strength to 4+/5 gross     Baseline  still weakness in right leower extremity left is progressing. see flow sheet     Time  4    Period  Weeks    Status  On-going        PT Long Term Goals - 05/15/18 1230      PT LONG TERM GOAL #1   Title  Patient demonstrates & verbalizes understanding of ongoing HEP / fitness plan without a  significant increase in pain     Time  8    Period  Weeks    Status  On-going      PT LONG TERM GOAL #2   Title  Patient will go stand for 30 minutes without self report of increased pain in order to improve ability to perfrom ADL's     Time  8    Period  Weeks    Status  On-going      PT LONG TERM GOAL #3   Title  Patient will increase lumbar spine motion by 50% in order to bend down and put her shoes on    Time  8    Period  Weeks    Status  On-going      PT LONG TERM GOAL #4   Title  Patient will demonstrate a 45% limitation on FOTO     Baseline  55 % limitation     Time  8    Period  Weeks    Status  On-going            Plan - 06/30/18 1610    Clinical Impression Statement  US helpful to right knee pain.  Mild exercises today see subjective.  Gait less antalgic post session due to decreased knee pain.     PT Next Visit Plan   Consider wall sit.   , Check goals and progress. may need to sit for lumbar stabilization,  figure 4 stretch as able, R  Likes Nu step.     PT Home Exercise Plan  Pendulums; Knee to chest stretch in sidelying; seated hamstring stretch abdominal isometrics.  Shoulder ER, yellow band  Isometric abdominals. Tennis ball for soft tissue  work   Sitting march with abdominals braced.     Consulted and Agree with Plan of Care  Patient       Patient will benefit from skilled therapeutic intervention in order to improve the following deficits and impairments:     Visit Diagnosis: Chronic bilateral low back pain with bilateral sciatica  Muscle weakness (generalized)  Difficulty in walking, not elsewhere classified     Problem List Patient Active Problem List   Diagnosis Date Noted  . Leg swelling 03/22/2018  . Notalgia 02/11/2018  . Anemia 02/11/2018  . Chronic right shoulder pain 01/21/2018  . Chronic bilateral low back pain with left-sided sciatica 12/14/2017  . History of stroke 08/13/2017  . Irregular menstrual cycle 08/13/2017  . Iron  deficiency anemia 12/12/2016  . Seizure ([place])   . ICH (intracerebral hemorrhage) ([place]) 11/08/2016  . Encounter for weight loss counseling 06/09/2016  . Chronic obstructive pulmonary disease (COPD) ([place]) 05/10/2016  . History of alcohol dependence ([place]) 01/11/2016  . URI (upper respiratory infection) 02/24/2014  . Left leg DVT ([place]) 09/29/2013  . Pre-diabetes 03/02/2013  . History of pulmonary embolism 05/05/2012  . Painful menstrual periods 01/05/2012  . Bipolar disorder ([place]) 07/03/2010  . History of tobacco abuse 08/23/2009  . Hyperlipidemia 04/30/2007  . Class 3 severe obesity due to excess calories with serious comorbidity and body mass index (BMI) of 50.0 to 59.9 in adult ([place]) 01/01/2007  . HYPERTENSION, BENIGN SYSTEMIC 01/01/2007    HARRIS,KAREN PTA 06/30/2018, 4:14 PM  [facility] [address] [place], [place], [address] Phone: [phone]   Fax:  [phone]  Name: Carla Hanson MRN: [id number] Date of Birth: Aug 25, 1966

## 2018-07-02 ENCOUNTER — Ambulatory Visit: Payer: Self-pay | Admitting: Physical Therapy

## 2018-07-02 DIAGNOSIS — G8929 Other chronic pain: Secondary | ICD-10-CM

## 2018-07-02 DIAGNOSIS — M6281 Muscle weakness (generalized): Secondary | ICD-10-CM

## 2018-07-02 DIAGNOSIS — R2681 Unsteadiness on feet: Secondary | ICD-10-CM

## 2018-07-02 DIAGNOSIS — M5442 Lumbago with sciatica, left side: Principal | ICD-10-CM

## 2018-07-02 DIAGNOSIS — R262 Difficulty in walking, not elsewhere classified: Secondary | ICD-10-CM

## 2018-07-02 DIAGNOSIS — M5441 Lumbago with sciatica, right side: Principal | ICD-10-CM

## 2018-07-02 DIAGNOSIS — M25511 Pain in right shoulder: Secondary | ICD-10-CM

## 2018-07-02 DIAGNOSIS — M25611 Stiffness of right shoulder, not elsewhere classified: Secondary | ICD-10-CM

## 2018-07-02 NOTE — Therapy (Signed)
[place], [place], [address] Phone: [phone]   Fax:  [phone]  Physical Therapy Treatment  Patient Details  Name: Carla Hanson MRN: [id number] Date of Birth: Jan 03, 1966 Referring Provider: Dr Tye Savoy Fitegerald   Encounter Date: 07/02/2018  PT End of Session - 07/02/18 1815    Visit Number  14    Number of Visits  16    Date for PT Re-Evaluation  07/22/18    PT Start Time  1332    PT Stop Time  1413    PT Time Calculation (min)  41 min    Activity Tolerance  Patient tolerated treatment well    Behavior During Therapy  [name] for tasks assessed/performed       Past Medical History:  Diagnosis Date  . Anxiety   . Bipolar disorder ([place])   . Hyperlipidemia   . Hypertension   . Lipoma    Abdomen  . LIPOMA 01/20/2008  . Obesity   . Pneumonia   . Tobacco abuse     Past Surgical History:  Procedure Laterality Date  . CESAREAN SECTION    . CYSTECTOMY    . LIPOMA EXCISION  03/2011  . ORIF ANKLE FRACTURE  03/13/2012   Procedure: OPEN REDUCTION INTERNAL FIXATION (ORIF) ANKLE FRACTURE;  Surgeon: Jessy Oto, MD;  Location: [facility];  Service: Orthopedics;  Laterality: Right;    There were no vitals filed for this visit.  Subjective Assessment - 07/02/18 1338    Subjective  [place] helped my knee a lot.  My back is sore,  my shoulder is sore.  I still have the female flux.  I  want the [place] and light work today.  No Nu step.      Currently in Pain?  Yes    Pain Score  6    back up to 10/10 yesterday,   worse with cooking   Pain Location  Back    Pain Orientation  Right;Left;Lower    Pain Descriptors / Indicators  Numbness   tingles and numb both legs with standing   Multiple Pain Sites  --   shoulder lateral anterior 6/10   sore worse with abduction,  hip catches with making a popping sliding noise.  Increases with taking so many steps.    Pain Location  Knee    Pain Orientation  Right;Left     Pain Descriptors / Indicators  Sore    Pain Frequency  Constant    Aggravating Factors   longer standing,  getting up from sitting     Pain Relieving Factors  rest    Effect of Pain on Daily Activities  limited standing 2 minutes with washing dishes.          [facility] PT Assessment - 07/02/18 0001      AROM   Right Shoulder Flexion  140 Degrees                   [facility] Adult PT Treatment/Exercise - 07/02/18 0001      Lumbar Exercises: Stretches   Active Hamstring Stretch Limitations  leg on stool to assist stretch sitting    Pelvic Tilt  10 reps      Lumbar Exercises: Seated   Other Seated Lumbar Exercises  Tilt with march      Knee/Hip Exercises: Seated   Heel Slides  10 reps      Shoulder Exercises: Seated   Retraction  10 reps  External Rotation  AROM    Flexion  AROM    Other Seated Exercises  isometric 5 X 5  abduction, IR,  ER extension.      Ultrasound   Ultrasound Location  knee anterior lateral    Ultrasound Parameters  100%, 1.5 watts/cm2, 1 MHz    Ultrasound Goals  Pain               PT Short Term Goals - 07/02/18 1810      PT SHORT TERM GOAL #1   Title  Patient will increase right active shoulder felxion by 25 degrees    Baseline  140    Time  4    Period  Weeks    Status  Achieved      PT SHORT TERM GOAL #2   Title  Patient will increase lumbar range of motion by 25%     Time  4    Period  Weeks    Status  Achieved      PT SHORT TERM GOAL #3   Title  Patient will increase right hip flexion to 100 degrees with < 2/10 pain     Baseline  sitting march to 85 could not get to 90 actively,  able  to sit with foot on stool to get 90.    Time  4    Period  Weeks    Status  On-going      PT SHORT TERM GOAL #4   Title  Patient will increase bilateral hip strength to 4+/5 gross     Time  4    Period  Weeks    Status  Unable to assess        PT Long Term Goals - 05/15/18 1230      PT LONG TERM GOAL #1   Title  Patient  demonstrates & verbalizes understanding of ongoing HEP / fitness plan without a significant increase in pain     Time  8    Period  Weeks    Status  On-going      PT LONG TERM GOAL #2   Title  Patient will go stand for 30 minutes without self report of increased pain in order to improve ability to perfrom ADL's     Time  8    Period  Weeks    Status  On-going      PT LONG TERM GOAL #3   Title  Patient will increase lumbar spine motion by 50% in order to bend down and put her shoes on    Time  8    Period  Weeks    Status  On-going      PT LONG TERM GOAL #4   Title  Patient will demonstrate a 45% limitation on FOTO     Baseline  55 % limitation     Time  8    Period  Weeks    Status  On-going            Plan - 07/02/18 1817    Clinical Impression Statement  Patient continues to want light exercise due to Menopause changes, .  STG#1 met.  [place] helpful with pain with gait however functionally she is able to stand 5-7 minutes. with 10 minutes being the most.  Back pain continues to also limit her ADL.  Today Carla Hanson was here 32  tear old.     PT Next Visit Plan   Consider wall sit.   , and progress.  may need to sit for lumbar stabilization,  figure 4 stretch as able, R  Likes Nu step.     PT Home Exercise Plan  Pendulums; Knee to chest stretch in sidelying; seated hamstring stretch abdominal isometrics.  Shoulder ER, yellow band  Isometric abdominals. Tennis ball for soft tissue work   Sitting march with abdominals braced.     Consulted and Agree with Plan of Care  Patient       Patient will benefit from skilled therapeutic intervention in order to improve the following deficits and impairments:     Visit Diagnosis: Chronic bilateral low back pain with bilateral sciatica  Muscle weakness (generalized)  Difficulty in walking, not elsewhere classified  Unsteadiness on feet  Acute pain of right shoulder  Stiffness of right shoulder, not elsewhere classified     Problem  List Patient Active Problem List   Diagnosis Date Noted  . Leg swelling 03/22/2018  . Notalgia 02/11/2018  . Anemia 02/11/2018  . Chronic right shoulder pain 01/21/2018  . Chronic bilateral low back pain with left-sided sciatica 12/14/2017  . History of stroke 08/13/2017  . Irregular menstrual cycle 08/13/2017  . Iron deficiency anemia 12/12/2016  . Seizure ([place])   . ICH (intracerebral hemorrhage) ([place]) 11/08/2016  . Encounter for weight loss counseling 06/09/2016  . Chronic obstructive pulmonary disease (COPD) ([place]) 05/10/2016  . History of alcohol dependence ([place]) 01/11/2016  . URI (upper respiratory infection) 02/24/2014  . Left leg DVT ([place]) 09/29/2013  . Pre-diabetes 03/02/2013  . History of pulmonary embolism 05/05/2012  . Painful menstrual periods 01/05/2012  . Bipolar disorder ([place]) 07/03/2010  . History of tobacco abuse 08/23/2009  . Hyperlipidemia 04/30/2007  . Class 3 severe obesity due to excess calories with serious comorbidity and body mass index (BMI) of 50.0 to 59.9 in adult ([place]) 01/01/2007  . HYPERTENSION, BENIGN SYSTEMIC 01/01/2007    Carla Hanson PTA 07/02/2018, 6:21 PM  [facility] [address] [place], [place], [address] Phone: [phone]   Fax:  [phone]  Name: Carla Hanson MRN: [id number] Date of Birth: 07-Oct-1966

## 2018-07-14 ENCOUNTER — Ambulatory Visit: Payer: Self-pay | Attending: Family Medicine | Admitting: Physical Therapy

## 2018-07-14 ENCOUNTER — Encounter: Payer: Self-pay | Admitting: Physical Therapy

## 2018-07-14 DIAGNOSIS — G8929 Other chronic pain: Secondary | ICD-10-CM | POA: Insufficient documentation

## 2018-07-14 DIAGNOSIS — M5441 Lumbago with sciatica, right side: Secondary | ICD-10-CM | POA: Insufficient documentation

## 2018-07-14 DIAGNOSIS — R262 Difficulty in walking, not elsewhere classified: Secondary | ICD-10-CM | POA: Insufficient documentation

## 2018-07-14 DIAGNOSIS — R2681 Unsteadiness on feet: Secondary | ICD-10-CM | POA: Insufficient documentation

## 2018-07-14 DIAGNOSIS — M5442 Lumbago with sciatica, left side: Secondary | ICD-10-CM | POA: Insufficient documentation

## 2018-07-14 DIAGNOSIS — M6281 Muscle weakness (generalized): Secondary | ICD-10-CM | POA: Insufficient documentation

## 2018-07-14 NOTE — Therapy (Addendum)
[place], [place], [address] Phone: [phone]   Fax:  [phone]  Physical Therapy Treatment/Discharge   Patient Details  Name: Carla Hanson MRN: [id number] Date of Birth: Jul 03, 1966 Referring Provider: Dr Tye Savoy Fitegerald   Encounter Date: 07/14/2018  PT End of Session - 07/14/18 1422    Visit Number  15    Number of Visits  16    Date for PT Re-Evaluation  07/22/18    PT Start Time  1340   short session due to patient late, and had moist heat 15 minutes   PT Stop Time  1430    PT Time Calculation (min)  50 min    Activity Tolerance  Patient tolerated treatment well    Behavior During Therapy  [facility] for tasks assessed/performed       Past Medical History:  Diagnosis Date  . Anxiety   . Bipolar disorder ([place])   . Hyperlipidemia   . Hypertension   . Lipoma    Abdomen  . LIPOMA 01/20/2008  . Obesity   . Pneumonia   . Tobacco abuse     Past Surgical History:  Procedure Laterality Date  . CESAREAN SECTION    . CYSTECTOMY    . LIPOMA EXCISION  03/2011  . ORIF ANKLE FRACTURE  03/13/2012   Procedure: OPEN REDUCTION INTERNAL FIXATION (ORIF) ANKLE FRACTURE;  Surgeon: Jessy Oto, MD;  Location: [facility];  Service: Orthopedics;  Laterality: Right;    There were no vitals filed for this visit.  Subjective Assessment - 07/14/18 1344    Subjective  Pain right hip pain with walking at grocery store 1 1/2 hours .  MD 27 th  of this month.  Legs shake  starting deep inside and works its way outward.  ( not in a couple of months)uncontrolled lasting 20 - 10 minutes better with resting calmly,  She can tell when it is going to happen    Currently in Pain?  Yes    Pain Score  8     Pain Location  Back    Pain Orientation  Right;Left;Lower    Pain Descriptors / Indicators  Sore   more sore than pain   Multiple Pain Sites  --   shoulder pain mild /  4/10   Pain Score  10    Pain Location  Hip     Pain Orientation  Right    Pain Descriptors / Indicators  Burning;Numbness   pain   Pain Radiating Towards  waist,  lateral hip    Pain Frequency  Constant   pain varies   Aggravating Factors   walking in store    Pain Relieving Factors  rest,  massage                       [facility] Adult PT Treatment/Exercise - 07/14/18 0001      Lumbar Exercises: Aerobic   Nustep  L5 7 minutes  UE/LE  cued stretch      Moist Heat Therapy   Number Minutes Moist Heat  10 Minutes    Moist Heat Location  Hip      Manual Therapy   Manual Therapy  Soft tissue mobilization    Manual therapy comments  instrument assist,  roller,  and manual  multiple trnder areas,  light pressure tolerated,  able to stand straighter post manual.      Soft tissue mobilization  taught patient  how to do at home.              PT Education - 07/14/18 1422    Education provided  Yes    Education Details  how to do manuat to hip    Person(s) Educated  Patient    Methods  Explanation;Demonstration    Comprehension  Verbalized understanding       PT Short Term Goals - 07/14/18 1426      PT SHORT TERM GOAL #3   Title  Patient will increase right hip flexion to 100 degrees with < 2/10 pain     Baseline  90 degrees on nu step    Time  4    Period  Weeks    Status  On-going      PT SHORT TERM GOAL #4   Title  Patient will increase bilateral hip strength to 4+/5 gross     Baseline  limited by pain    Time  4    Period  Weeks    Status  Unable to assess        PT Long Term Goals - 05/15/18 1230      PT LONG TERM GOAL #1   Title  Patient demonstrates & verbalizes understanding of ongoing HEP / fitness plan without a significant increase in pain     Time  8    Period  Weeks    Status  On-going      PT LONG TERM GOAL #2   Title  Patient will go stand for 30 minutes without self report of increased pain in order to improve ability to perfrom ADL's     Time  8    Period  Weeks    Status   On-going      PT LONG TERM GOAL #3   Title  Patient will increase lumbar spine motion by 50% in order to bend down and put her shoes on    Time  8    Period  Weeks    Status  On-going      PT LONG TERM GOAL #4   Title  Patient will demonstrate a 45% limitation on FOTO     Baseline  55 % limitation     Time  8    Period  Weeks    Status  On-going            Plan - 07/14/18 1423    Clinical Impression Statement   Trial of manual due to patient pain increased to 10/10.  Posture improved in standing post manual/stretch.  Patient was able to stand 1.5 hours in store with 10/10.      PT Next Visit Plan  POC through 9/18,      PT Home Exercise Plan  Pendulums; Knee to chest stretch in sidelying; seated hamstring stretch abdominal isometrics.  Shoulder ER, yellow band  Isometric abdominals. Tennis ball for soft tissue work   Sitting march with abdominals braced.     Consulted and Agree with Plan of Care  Patient       Patient will benefit from skilled therapeutic intervention in order to improve the following deficits and impairments:     Visit Diagnosis: Chronic bilateral low back pain with bilateral sciatica  Muscle weakness (generalized)  Difficulty in walking, not elsewhere classified  Unsteadiness on feet     Problem List Patient Active Problem List   Diagnosis Date Noted  . Leg swelling 03/22/2018  . Notalgia 02/11/2018  .  Anemia 02/11/2018  . Chronic right shoulder pain 01/21/2018  . Chronic bilateral low back pain with left-sided sciatica 12/14/2017  . History of stroke 08/13/2017  . Irregular menstrual cycle 08/13/2017  . Iron deficiency anemia 12/12/2016  . Seizure ([place])   . ICH (intracerebral hemorrhage) ([place]) 11/08/2016  . Encounter for weight loss counseling 06/09/2016  . Chronic obstructive pulmonary disease (COPD) ([place]) 05/10/2016  . History of alcohol dependence ([place]) 01/11/2016  . URI (upper respiratory infection) 02/24/2014  . Left leg DVT ([place])  09/29/2013  . Pre-diabetes 03/02/2013  . History of pulmonary embolism 05/05/2012  . Painful menstrual periods 01/05/2012  . Bipolar disorder ([place]) 07/03/2010  . History of tobacco abuse 08/23/2009  . Hyperlipidemia 04/30/2007  . Class 3 severe obesity due to excess calories with serious comorbidity and body mass index (BMI) of 50.0 to 59.9 in adult ([place]) 01/01/2007  . HYPERTENSION, BENIGN SYSTEMIC 01/01/2007   PHYSICAL THERAPY DISCHARGE SUMMARY  Visits from Start of Care: 15  Current functional level related to goals / functional outcomes: Patient did not show for last 2 visits. D/C    Remaining deficits: Unknown. Patient had made improvement but was still in pain    Education / Equipment: HEP   Plan: Patient agrees to discharge.  Patient goals were not met. Patient is being discharged due to lack of progress.  ?????      Carolyne Littles PT DPT  07/14/2018    Melvenia Needles PTA 07/14/2018, 6:32 PM  [facility] [address] [place], [place], [address] Phone: [phone]   Fax:  [phone]  Name: ZETHA KUHAR MRN: [id number] Date of Birth: 1966-02-11

## 2018-07-17 ENCOUNTER — Ambulatory Visit: Payer: Self-pay | Admitting: Physical Therapy

## 2018-07-20 ENCOUNTER — Ambulatory Visit: Payer: Self-pay | Admitting: Family Medicine

## 2018-07-21 ENCOUNTER — Ambulatory Visit: Payer: Self-pay | Admitting: Physical Therapy

## 2018-07-23 ENCOUNTER — Ambulatory Visit: Payer: Self-pay | Admitting: Physical Therapy

## 2018-07-31 ENCOUNTER — Ambulatory Visit: Payer: Self-pay | Admitting: Family Medicine

## 2018-08-10 ENCOUNTER — Other Ambulatory Visit: Payer: Self-pay

## 2018-08-10 ENCOUNTER — Ambulatory Visit (INDEPENDENT_AMBULATORY_CARE_PROVIDER_SITE_OTHER): Payer: Self-pay | Admitting: Family Medicine

## 2018-08-10 VITALS — BP 124/70 | HR 77 | Temp 98.6°F | Wt 303.0 lb

## 2018-08-10 DIAGNOSIS — I10 Essential (primary) hypertension: Secondary | ICD-10-CM

## 2018-08-10 DIAGNOSIS — J441 Chronic obstructive pulmonary disease with (acute) exacerbation: Secondary | ICD-10-CM

## 2018-08-10 MED ORDER — PREDNISONE 20 MG PO TABS: 40.0000 mg | ORAL_TABLET | Freq: Every day | ORAL | 0 refills | Status: AC

## 2018-08-10 MED ORDER — AZITHROMYCIN 250 MG PO TABS: ORAL_TABLET | ORAL | 0 refills | Status: DC

## 2018-08-10 NOTE — Patient Instructions (Signed)
It was wonderful to see you today.  Thank you for choosing [facility].   Please call [phone] with any questions about today's appointment.  Please be sure to schedule follow up at the front  desk before you leave today.   Try honey with tea  Humidifer in your room  Return if your breathing worsens, you develop fevers, or chest pains.   Dorris Singh, MD  Family Medicine

## 2018-08-10 NOTE — Progress Notes (Signed)
  Patient Name: Carla Hanson Date of Birth: 15-Aug-1966 Date of Visit: 08/10/18 PCP: Steve Rattler, DO  Chief Complaint: cough and congestion   Subjective: Carla Hanson is a pleasant 52 y.o. year old with history of tobacco abuse, morbid obesity, and hypertension presenting today with cough and congestion.  Ms. Steffey she reports 2 weeks of cough and congestion.  She reports she is having wheezing which triggers her cough and is most bothersome.  She reports a productive cough with greenish sputum.  She has been using her inhaler and nebulizer every 4 hours.  She previously smoked but quit 2 years ago.  She does endorse a history of seasonal allergies and reports several sick contacts.  She denies lower extremity edema, orthopnea, paroxysmal nocturnal dyspnea, chest pain with exertion, palpitations, fevers.  History also significant for pulmonary embolism.  She denies chest pain or lower extremity edema.  She has had no recent travel.  She is eating and drinking well.  She feels that this is 1 of her typical bronchitis exacerbations.   ROS:  ROS Negative except for as above I have reviewed the patient's medical, surgical, family, and social history as appropriate.   Vitals:   08/10/18 0858  BP: 124/70  Pulse: 77  Temp: 98.6 F (37 C)  SpO2: 99%   Filed Weights   08/10/18 0858  Weight: (!) 303 lb (137.4 kg)   Well-appearing obese woman no distress. HEENT: Sclera anicteric. Dentition is moderate. Appears well hydrated. Neck: Supple Cardiac: Regular rate and rhythm. Normal S1/S2. No murmurs, rubs, or gallops appreciated. Lungs: Diminished bilateral breath sounds with wheezing throughout the upper lobes.  This changes with coughing with improved expiratory force.  No egophony.    [place] was seen today for nasal congestion and cough.  Diagnoses and all orders for this visit:  Essential hypertension -     Basic Metabolic Panel  Chronic obstructive pulmonary disease with  exacerbation.  This is an acute problem newly worsened.  We reviewed symptomatic management including honey, hydration, decongestants without pseudoephedrine.  No signs of pneumonia, pulmonary embolism or heart failure.  Would benefit from the addition of a long-acting antimuscarinic.  She has a history of moderate obstructive pulmonary disease by pulmonary function testing in 2017.  Reviewed reasons to return to care and call. -     predniSONE (DELTASONE) 20 MG tablet; Take 2 tablets (40 mg total) by mouth daily with breakfast for 5 days. -     azithromycin (ZITHROMAX) 250 MG tablet; Take 2 tablets first day then 1 tablet every day after until finished.   Dorris Singh, MD  Family Medicine Teaching Service

## 2018-08-11 ENCOUNTER — Encounter: Payer: Self-pay | Admitting: Family Medicine

## 2018-08-11 LAB — BASIC METABOLIC PANEL
BUN/Creatinine Ratio: 14 (ref 9–23)
BUN: 8 mg/dL (ref 6–24)
CALCIUM: 9.4 mg/dL (ref 8.7–10.2)
CO2: 25 mmol/L (ref 20–29)
Chloride: 100 mmol/L (ref 96–106)
Creatinine, Ser: 0.56 mg/dL — ABNORMAL LOW (ref 0.57–1.00)
GFR calc non Af Amer: 108 mL/min/{1.73_m2} (ref >59)
GFR, EST AFRICAN AMERICAN: 125 mL/min/{1.73_m2} (ref >59)
Glucose: 92 mg/dL (ref 65–99)
POTASSIUM: 4 mmol/L (ref 3.5–5.2)
Sodium: 140 mmol/L (ref 134–144)

## 2018-08-31 ENCOUNTER — Ambulatory Visit: Payer: Self-pay | Admitting: Family Medicine

## 2018-09-01 ENCOUNTER — Telehealth: Payer: Self-pay | Admitting: Family Medicine

## 2018-09-01 NOTE — Telephone Encounter (Signed)
Spoke with pt reminding them about/confirming their next-day appt. -[place]

## 2018-09-02 ENCOUNTER — Ambulatory Visit (INDEPENDENT_AMBULATORY_CARE_PROVIDER_SITE_OTHER): Payer: Self-pay | Admitting: Student in an Organized Health Care Education/Training Program

## 2018-09-02 ENCOUNTER — Ambulatory Visit
Admission: RE | Admit: 2018-09-02 | Discharge: 2018-09-02 | Disposition: A | Discharge status: Home / Self Care | Payer: Self-pay | Source: Ambulatory Visit | Attending: Family Medicine | Admitting: Family Medicine

## 2018-09-02 VITALS — BP 130/80 | HR 84 | Temp 98.2°F | Wt 306.6 lb

## 2018-09-02 DIAGNOSIS — J441 Chronic obstructive pulmonary disease with (acute) exacerbation: Secondary | ICD-10-CM

## 2018-09-02 DIAGNOSIS — R05 Cough: Secondary | ICD-10-CM | POA: Diagnosis not present

## 2018-09-02 DIAGNOSIS — R062 Wheezing: Secondary | ICD-10-CM

## 2018-09-02 MED ORDER — IPRATROPIUM BROMIDE 0.02 % IN SOLN
0.5000 mg | Freq: Once | RESPIRATORY_TRACT | Status: AC
  Administered 2018-09-02: 0.5 mg via RESPIRATORY_TRACT

## 2018-09-02 MED ORDER — ALBUTEROL SULFATE (2.5 MG/3ML) 0.083% IN NEBU
2.5000 mg | INHALATION_SOLUTION | Freq: Once | RESPIRATORY_TRACT | Status: AC
  Administered 2018-09-02: 2.5 mg via RESPIRATORY_TRACT

## 2018-09-02 MED ORDER — ALBUTEROL SULFATE HFA 108 (90 BASE) MCG/ACT IN AERS: 1.0000 | INHALATION_SPRAY | Freq: Four times a day (QID) | RESPIRATORY_TRACT | 5 refills | Status: DC | PRN

## 2018-09-02 MED ORDER — PREDNISONE 20 MG PO TABS: 40.0000 mg | ORAL_TABLET | Freq: Every day | ORAL | 0 refills | Status: AC

## 2018-09-02 MED ORDER — AZITHROMYCIN 250 MG PO TABS: ORAL_TABLET | ORAL | 0 refills | Status: DC

## 2018-09-02 NOTE — Assessment & Plan Note (Signed)
1. COPD exacerbation ([place]) -patient was most recently treated for a COPD exacerbation 3 weeks ago.  She reports that her symptoms completely resolved and then returned 2 weeks ago.  Not having any chest pain or shortness of breath to think of a pulmonary embolism.  Her symptoms have persisted for 2 weeks I think it is reasonable to go ahead and get a chest x-ray at this time.  The chest x-ray shows a lobar pneumonia, would consider broadening to Levaquin given that she has had antibiotics in the last 3 months and would require pseudomonal coverage in that case. - breathing treatment in the office today - DG Chest 2 View; Future - azithromycin (ZITHROMAX) 250 MG tablet; Take two tablets on the first day. Take one tablet per day starting tomorrow until you have completed the 5 day course.  Dispense: 6 tablet; Refill: 0 - predniSONE (DELTASONE) 20 MG tablet; Take 2 tablets (40 mg total) by mouth daily with breakfast for 5 days.  Dispense: 10 tablet; Refill: 0 - Refilled albuterol (PROVENTIL HFA;VENTOLIN HFA) 108 (90 Base) MCG/ACT inhaler; Inhale 1 puff into the lungs every 6 (six) hours as needed for wheezing or shortness of breath.  Dispense: 8 g; Refill: 5 - patient would benefit from a controller medication, however she reports that she is not ready to start this at this time.  Advised her to schedule a follow-up appointment in 2 weeks in order to discuss a COPD controller medication when she is feeling better.

## 2018-09-02 NOTE — Patient Instructions (Signed)
It was a pleasure seeing you today in our clinic.  Here is the treatment plan we have discussed and agreed upon together:   Please schedule follow up in 2 weeks so we can discuss prevention of further COPD attacks.  We ordered a chest XR at today's visit. I will call or send you a letter with these results. If you do not hear from me within the next week, please give our office a call.  Our clinic's number is [phone]. Please call with questions or concerns about what we discussed today.  Be well, Dr. Burr Medico

## 2018-09-02 NOTE — Progress Notes (Signed)
CC: coughing and wheeznig  HPI: Carla Hanson is a 52 y.o. female with PMH significant for COPD, HTN, Hx of DVT, Hx of intracerebral hemorrhage, hx of PE, Hx of stroke, bipolar disorder, HLD, hx of tobacco abuse who presents to [facility] today with wheezing and cough of 2 weeks duration.   Patient presents with 2 weeks of wheezing and cough productive of light yellow sputum. She reports that she has required her albuterol rescue inhaler or nebulizer three times per day for the past two weeks and she has also had nighttime awakenings with cough and wheezing. She was most recently treated for a COPD exacerbation bout 3 weeks ago and reports that her symptoms completely resolved after prednisone burst/azithromycin. However, her current symptoms have persisted for 2 weeks. She is not currently open to starting a controller inhaler. She has not had fevers.  No nausea, vomiting or diarrhea. No dyspnea/palpitations/chest pain to think of PE as is in her history.  Review of Symptoms:  See HPI for ROS.   CC, SH/smoking status, and VS noted.  Objective: BP 130/80   Pulse 84   Temp 98.2 F (36.8 C)   Wt (!) 306 lb 9.6 oz (139.1 kg)   SpO2 97%   BMI 52.63 kg/m  GEN: NAD, alert, cooperative, and pleasant. EYE: no conjunctival injection, pupils equally round and reactive to light ENMT: normal tympanic light reflex, no nasal polyps,no rhinorrhea, no pharyngeal erythema or exudates NECK: full ROM RESPIRATORY: +coarse breath sounds heard throughout with soft wheezes appreciated throughout. Good effort. Comfortable work of breathing. Speaks in full sentences. clear to auscultation bilaterally with no wheezes, rhonchi or rales, good effort CV: RRR, no m/r/g, no peripheral edema GI: soft, non-tender, non-distended, no hepatosplenomegaly SKIN: warm and dry, no rashes or lesions NEURO: II-XII grossly intact, normal gait, peripheral sensation intact PSYCH: AAOx3, appropriate affect  Assessment and  plan:  COPD exacerbation ([place]) 1. COPD exacerbation ([place]) -patient was most recently treated for a COPD exacerbation 3 weeks ago.  She reports that her symptoms completely resolved and then returned 2 weeks ago.  Not having any chest pain or shortness of breath to think of a pulmonary embolism.  Her symptoms have persisted for 2 weeks I think it is reasonable to go ahead and get a chest x-ray at this time.  The chest x-ray shows a lobar pneumonia, would consider broadening to Levaquin given that she has had antibiotics in the last 3 months and would require pseudomonal coverage in that case. - breathing treatment in the office today - DG Chest 2 View; Future - azithromycin (ZITHROMAX) 250 MG tablet; Take two tablets on the first day. Take one tablet per day starting tomorrow until you have completed the 5 day course.  Dispense: 6 tablet; Refill: 0 - predniSONE (DELTASONE) 20 MG tablet; Take 2 tablets (40 mg total) by mouth daily with breakfast for 5 days.  Dispense: 10 tablet; Refill: 0 - Refilled albuterol (PROVENTIL HFA;VENTOLIN HFA) 108 (90 Base) MCG/ACT inhaler; Inhale 1 puff into the lungs every 6 (six) hours as needed for wheezing or shortness of breath.  Dispense: 8 g; Refill: 5 - patient would benefit from a controller medication, however she reports that she is not ready to start this at this time.  Advised her to schedule a follow-up appointment in 2 weeks in order to discuss a COPD controller medication when she is feeling better.    Orders Placed This Encounter  Procedures  . DG Chest 2 View  Standing Status:   Future    Standing Expiration Date:   11/02/2019    Order Specific Question:   Reason for Exam (SYMPTOM  OR DIAGNOSIS REQUIRED)    Answer:   COPD exacerbation rule out PNA    Order Specific Question:   Is the patient pregnant?    Answer:   No    Order Specific Question:   Preferred imaging location?    Answer:   [facility]    Meds ordered this encounter   Medications  . albuterol (PROVENTIL HFA;VENTOLIN HFA) 108 (90 Base) MCG/ACT inhaler    Sig: Inhale 1 puff into the lungs every 6 (six) hours as needed for wheezing or shortness of breath.    Dispense:  8 g    Refill:  5  . azithromycin (ZITHROMAX) 250 MG tablet    Sig: Take two tablets on the first day. Take one tablet per day starting tomorrow until you have completed the 5 day course.    Dispense:  6 tablet    Refill:  0  . predniSONE (DELTASONE) 20 MG tablet    Sig: Take 2 tablets (40 mg total) by mouth daily with breakfast for 5 days.    Dispense:  10 tablet    Refill:  0    Everrett Coombe, MD,MS,  PGY3 09/02/2018 9:21 AM

## 2018-09-14 ENCOUNTER — Telehealth: Payer: Self-pay | Admitting: *Deleted

## 2018-09-14 NOTE — Telephone Encounter (Signed)
-----   Message from Everrett Coombe, MD sent at 09/11/2018  3:23 PM EST ----- Please call and let thep atient know that her chest XR was negative for evidence of pneumonia.

## 2018-09-14 NOTE — Telephone Encounter (Signed)
Pt informed of below. Zimmerman Rumple, April D, CMA  

## 2018-09-15 ENCOUNTER — Encounter: Payer: Self-pay | Admitting: Family Medicine

## 2018-09-15 ENCOUNTER — Other Ambulatory Visit: Payer: Self-pay

## 2018-09-15 ENCOUNTER — Ambulatory Visit (INDEPENDENT_AMBULATORY_CARE_PROVIDER_SITE_OTHER): Payer: Self-pay | Admitting: Family Medicine

## 2018-09-15 VITALS — BP 124/60 | HR 67 | Temp 98.4°F | Ht 64.0 in | Wt 311.1 lb

## 2018-09-15 DIAGNOSIS — I10 Essential (primary) hypertension: Secondary | ICD-10-CM

## 2018-09-15 DIAGNOSIS — M5442 Lumbago with sciatica, left side: Secondary | ICD-10-CM

## 2018-09-15 DIAGNOSIS — D509 Iron deficiency anemia, unspecified: Secondary | ICD-10-CM | POA: Diagnosis not present

## 2018-09-15 DIAGNOSIS — E785 Hyperlipidemia, unspecified: Secondary | ICD-10-CM

## 2018-09-15 DIAGNOSIS — E66813 Obesity, class 3: Secondary | ICD-10-CM

## 2018-09-15 DIAGNOSIS — Z6841 Body Mass Index (BMI) 40.0 and over, adult: Secondary | ICD-10-CM

## 2018-09-15 DIAGNOSIS — G8929 Other chronic pain: Secondary | ICD-10-CM

## 2018-09-15 MED ORDER — LOSARTAN POTASSIUM-HCTZ 50-12.5 MG PO TABS: 1.0000 | ORAL_TABLET | Freq: Every day | ORAL | 2 refills | Status: DC

## 2018-09-15 NOTE — Progress Notes (Signed)
Medical Nutrition Therapy:  Appt start time: 1100 end time:  1200  Assessment:  Primary concerns today: Weight management and Blood sugar control.   Carla Hanson has been getting back on track with respect to diet and exercise.  She is eager to make changes, and wants to lose some weight.  She identifies sweet tea and other sweet beverages as problematic for her.  While I recommended she wean herself off of the highly sweet tea at last nutrition appt, she feels she cannot make incremental changes, but rather needs to just stop drinking sweet drinks altogether.  Carla Hanson insisted this is easier for her.      Usual eating pattern:  2 meals and 0-2 snacks/day.   24-hr recall attempted, but patient could not remember all foods consumed:  (Up at 7 AM) B (9 AM)-  16 oz [employer] swt tea, 1 [employer] chx biscuit Snk ( AM)-   L (1 PM)-  ?? Turk bacon&egg sandw, 12 oz Coke ?? Snk (3 PM)-  1 banana D ( PM)-  --- Snk ( PM)-  --- Typical day? No.  Patient could not remember foods from yesterday.   Learning Readiness: Change in progress  Recent physical activity includes walking 30 min >5 days a week with her mom.  (Just started back last week.)   Still feels some leg and back pain, and does PT (~10 min) only occasionally.      Progress Towards Goal(s):  In progress.   Nutritional Diagnosis:  No progress noted on [place]-3.3 Overweight/obesity As related to energy balance.  As evidenced by 7-lb weight gain since July.    Intervention:  Nutrition education  Handouts given during visit include:  AVS  Goals Sheet (revised)  Demonstrated degree of understanding via:  Teach Back  Barriers to learning/adherence to lifestyle change: Continued leg and back pain makes exercise difficult, but Carla Hanson is committed to walking nonetheless, and is doing PT exercises regularly.    Monitoring/Evaluation:  Dietary intake, exercise, and body weight in January 2020.

## 2018-09-15 NOTE — Patient Instructions (Addendum)
-   If your hemoglobin is low today, restart your iron supplement.  Take it on an empty stomach with at least 200 mg vitamin C.    - Do your 10 minutes of PT exercises EVERY DAY (or at least 6 days a week).  Aim for getting this done before lunch time!  - In addition to specific goals outlined below, I encourage you to get 3 real meals each day (real meal = includes protein, at least a small amount of starch, vegs' and/or fruit.   - Use the Meal Planning Form to devise 7 meals.  Criteria for meals: You like the foods, and each meal is relatively easy and quick to prepare.  Use your meal plans as a basis for shopping, so you have the ingredients for all meals at any given time.    Goals: 1. Limit drinks to those without sugar or sweeteners, using plain water, infused water, water with lemon, seltzer water or flavored seltzer.    2. Walk at least 30 minutes (or 1800 steps) 6 X wk.  (Track # minutes / steps.)  Record the weekly average in the extra box at the end of each week.   3. At least 10 minutes of PT exercises 6 days a week.

## 2018-09-15 NOTE — Patient Instructions (Addendum)
  Nice to see you today! I put the referral in for PT again for your low back. We'll check cholesterol and blood counts today. I'll let you know the results.  If you have questions or concerns please do not hesitate to call at [phone].  Lucila Maine, DO PGY-3, [facility] 09/15/2018 3:51 PM

## 2018-09-15 NOTE — Progress Notes (Signed)
    Subjective:    Patient ID: Carla Hanson, female    DOB: 05-31-1966, 52 y.o.   MRN: [id number]   CC: check up  HPI:  Wants to get lipids checked, iron levels checked. Feeling well overall Meeting w/ Dr. Jenne Campus today to discuss weight loss Needs BP meds refilled.  She reports compliance with her medications. She would like to get referral to PT for her low back pain. She had been seeing them and making progress but ran out of sessions and needs new referral.  Smoking status reviewed- former smoker, quit 2 years ago.  Review of Systems- She denies CP, SOB, DOE, leg swelling.    Objective:  BP 124/60   Pulse 67   Temp 98.4 F (36.9 C) (Oral)   Ht 5\' 4"  (1.626 m)   Wt (!) 311 lb 2 oz (141.1 kg)   SpO2 99%   BMI 53.40 kg/m  Vitals and nursing note reviewed  General: well nourished, in no acute distress HEENT: normocephalic, TM's visualized bilaterally, no scleral icterus or conjunctival pallor, no nasal discharge, moist mucous membranes, good dentition without erythema or discharge noted in posterior oropharynx Neck: supple, non-tender, without lymphadenopathy Cardiac: RRR, clear S1 and S2, no murmurs, rubs, or gallops Respiratory: clear to auscultation bilaterally, no increased work of breathing Abdomen: soft, nontender, nondistended, no masses or organomegaly. Bowel sounds present Extremities: no edema or cyanosis. Warm, well perfused. 2+ radial and PT pulses bilaterally Skin: warm and dry, no rashes noted Neuro: alert and oriented, no focal deficits   Assessment & Plan:    Hyperlipidemia  History of elevated lipids, not currently on statin. Will repeat today.  Class 3 severe obesity due to excess calories with serious comorbidity and body mass index (BMI) of 50.0 to 59.9 in adult [facility]) Working with RD Dr Jenne Campus to improve diet. Has appointment with her today. Encouraged her weight loss efforts.  HYPERTENSION, BENIGN SYSTEMIC  Chronic, well controlled.  Continue arb-hctz combo. Refilled BP meds today.  Iron deficiency anemia  Chronic, on iron supplementation. Repeat CBC today. Patient needs colonoscopy to evaluate further.  Chronic bilateral low back pain with left-sided sciatica  Referred patient back to PT per her request. She had improvement in symptoms w/ PT.   Return in about 6 months (around 03/16/2019) for HTN.   Lucila Maine, DO Family Medicine Resident PGY-3

## 2018-09-16 LAB — LIPID PANEL
CHOLESTEROL TOTAL: 231 mg/dL — AB (ref 100–199)
Chol/HDL Ratio: 5.8 ratio — ABNORMAL HIGH (ref 0.0–4.4)
HDL: 40 mg/dL (ref >39)
LDL CALC: 153 mg/dL — AB (ref 0–99)
TRIGLYCERIDES: 191 mg/dL — AB (ref 0–149)
VLDL Cholesterol Cal: 38 mg/dL (ref 5–40)

## 2018-09-16 LAB — CBC
HEMATOCRIT: 31.6 % — AB (ref 34.0–46.6)
HEMOGLOBIN: 10.1 g/dL — AB (ref 11.1–15.9)
MCH: 24.7 pg — AB (ref 26.6–33.0)
MCHC: 32 g/dL (ref 31.5–35.7)
MCV: 77 fL — ABNORMAL LOW (ref 79–97)
Platelets: 434 10*3/uL (ref 150–450)
RBC: 4.09 x10E6/uL (ref 3.77–5.28)
RDW: 15.1 % (ref 12.3–15.4)
WBC: 10.8 10*3/uL (ref 3.4–10.8)

## 2018-09-17 ENCOUNTER — Other Ambulatory Visit: Payer: Self-pay

## 2018-09-17 ENCOUNTER — Encounter: Payer: Self-pay | Admitting: Family Medicine

## 2018-09-17 DIAGNOSIS — I10 Essential (primary) hypertension: Secondary | ICD-10-CM

## 2018-09-17 MED ORDER — LOSARTAN POTASSIUM-HCTZ 50-12.5 MG PO TABS: 1.0000 | ORAL_TABLET | Freq: Every day | ORAL | 2 refills | Status: DC

## 2018-09-17 NOTE — Assessment & Plan Note (Signed)
  Chronic, on iron supplementation. Repeat CBC today. Patient needs colonoscopy to evaluate further.

## 2018-09-17 NOTE — Assessment & Plan Note (Signed)
  Referred patient back to PT per her request. She had improvement in symptoms w/ PT.

## 2018-09-17 NOTE — Assessment & Plan Note (Signed)
  Chronic, well controlled. Continue arb-hctz combo. Refilled BP meds today.

## 2018-09-17 NOTE — Assessment & Plan Note (Signed)
  History of elevated lipids, not currently on statin. Will repeat today.

## 2018-09-17 NOTE — Assessment & Plan Note (Signed)
Working with RD Dr Jenne Campus to improve diet. Has appointment with her today. Encouraged her weight loss efforts.

## 2018-09-17 NOTE — Telephone Encounter (Signed)
Losartan/HCTZ re-sent to [facility] at patient request.  Danley Danker, RN [facility] RN)

## 2018-09-22 ENCOUNTER — Telehealth: Payer: Self-pay

## 2018-09-22 NOTE — Telephone Encounter (Signed)
Patient left message asking for lab results from last visit.  Call back is [phone]  Danley Danker, RN [facility] [place])

## 2018-09-22 NOTE — Telephone Encounter (Signed)
Will forward to Dr. Riccio.  Jazmin Hartsell,CMA  

## 2018-09-23 ENCOUNTER — Encounter: Payer: Self-pay | Admitting: Gastroenterology

## 2018-09-24 ENCOUNTER — Telehealth: Payer: Self-pay

## 2018-09-24 NOTE — Telephone Encounter (Signed)
Spoke with patient and explained. Pt stated she will not be having a colonoscopy done, "I do the stick and poop test." Pt stated her anemia is coming from the "change" and not her colon.

## 2018-09-24 NOTE — Telephone Encounter (Signed)
This is for colonoscopy which she should have done to evaluate her anemia.

## 2018-09-24 NOTE — Telephone Encounter (Signed)
Patient calling she got a letter from [place] saying she was referred by PCP. She does not know what this is about and states no one here spoke with her about it.  Call back is [phone]  Danley Danker, RN [facility] [place])

## 2018-09-24 NOTE — Telephone Encounter (Signed)
I had sent letter to patient 11/14. Please let her know:  Blood counts are stable. Your cholesterol is elevated but not to a point where we would add medication. Continue working on diet and exercise.

## 2018-09-25 NOTE — Telephone Encounter (Signed)
Patient informed and she has received letter.  Jazmin Hartsell,CMA

## 2018-10-05 ENCOUNTER — Other Ambulatory Visit: Payer: Self-pay

## 2018-10-05 ENCOUNTER — Encounter: Payer: Self-pay | Admitting: Physical Therapy

## 2018-10-05 ENCOUNTER — Ambulatory Visit: Payer: Medicaid Other | Attending: Family Medicine | Admitting: Physical Therapy

## 2018-10-05 DIAGNOSIS — G8929 Other chronic pain: Secondary | ICD-10-CM

## 2018-10-05 DIAGNOSIS — M5441 Lumbago with sciatica, right side: Secondary | ICD-10-CM | POA: Insufficient documentation

## 2018-10-05 DIAGNOSIS — R262 Difficulty in walking, not elsewhere classified: Secondary | ICD-10-CM

## 2018-10-05 DIAGNOSIS — M6281 Muscle weakness (generalized): Secondary | ICD-10-CM

## 2018-10-05 DIAGNOSIS — M5442 Lumbago with sciatica, left side: Secondary | ICD-10-CM | POA: Insufficient documentation

## 2018-10-05 DIAGNOSIS — R2681 Unsteadiness on feet: Secondary | ICD-10-CM | POA: Insufficient documentation

## 2018-10-05 NOTE — Therapy (Addendum)
[place], [place], [address] Phone: [phone]   Fax:  [phone]  Physical Therapy Evaluation  Patient Details  Name: Carla Hanson MRN: [id number] Date of Birth: Nov 20, 1965 Referring Provider (PT): Steve Rattler ,DO   Encounter Date: 10/05/2018  PT End of Session - 10/05/18 1236    Visit Number  1    Number of Visits  16    Date for PT Re-Evaluation  11/30/18    Authorization Type  CFA    PT Start Time  1025    PT Stop Time  1100    PT Time Calculation (min)  35 min    Activity Tolerance  Patient limited by pain    Behavior During Therapy  [facility] for tasks assessed/performed       Past Medical History:  Diagnosis Date  . Anxiety   . Bipolar disorder ([place])   . Hyperlipidemia   . Hypertension   . Lipoma    Abdomen  . LIPOMA 01/20/2008  . Obesity   . Pneumonia   . Tobacco abuse     Past Surgical History:  Procedure Laterality Date  . CESAREAN SECTION    . CYSTECTOMY    . LIPOMA EXCISION  03/2011  . ORIF ANKLE FRACTURE  03/13/2012   Procedure: OPEN REDUCTION INTERNAL FIXATION (ORIF) ANKLE FRACTURE;  Surgeon: Jessy Oto, MD;  Location: [facility];  Service: Orthopedics;  Laterality: Right;    There were no vitals filed for this visit.   Subjective Assessment - 10/05/18 1030    Subjective  Patient reports she never had complete pain relief from last PT in September but has worsened since she stopped. Now has burning/numbing sensation into bil hip region with increased walking activities. Reports she was doing HEP at home after discharge in Sept. and was helping but she went out of town on a trip and has been really busy in the past few months so hasn't had the time she needs to keep up with exercises at home. Reports bil knee pain at time of session as well with R>L.    Pertinent History  ICH, seizure, ORIF right ankle 03/13/2012, BiPolar, HTN, Lipoma, obesity, DVT    Limitations   Lifting;Standing;Walking    How long can you stand comfortably?  30 mins    How long can you walk comfortably?  30 mins    Patient Stated Goals  decrease pain to walk with grandchildren and clean    Currently in Pain?  Yes    Pain Score  4    can get 10/10 with walking activities   Pain Location  Back    Pain Orientation  Right;Left;Lower    Pain Descriptors / Indicators  Tightness;Sore;Burning   stiff   Pain Type  Chronic pain    Pain Radiating Towards  bil hip posterior/lateral    Pain Onset  More than a month ago    Pain Frequency  Intermittent    Aggravating Factors   walking, bending, cleaning, shopping    Pain Relieving Factors  heat     Effect of Pain on Daily Activities  limits activity     Multiple Pain Sites  Yes   Bil knee pain with R>L        [facility] PT Assessment - 10/05/18 0001      Assessment   Medical Diagnosis  Chronic bil low back pain    Referring Provider (PT)  Steve Rattler ,DO  Onset Date/Surgical Date  11/08/16    Hand Dominance  Right    Prior Therapy  Yes ended PT for low back in September       Precautions   Precautions  None      Restrictions   Weight Bearing Restrictions  No      Balance Screen   Has the patient fallen in the past 6 months  No    Has the patient had a decrease in activity level because of a fear of falling?   No    Is the patient reluctant to leave their home because of a fear of falling?   No      Home Film/video editor residence      Prior Function   Level of Independence  Independent    Vocation  Unemployed    Leisure  shopping, walking with grandsons, cleaning      Cognition   Overall Cognitive Status  Within Functional Limits for tasks assessed    Attention  Focused    Sustained Attention  Appears intact    Memory  Appears intact    Awareness  Appears intact    Problem Solving  Appears intact      Observation/Other Assessments   Observations  Pt leaning to the left in the chair        Sensation   Light Touch  Appears Intact    Stereognosis  Appears Intact    Hot/Cold  Appears Intact    Proprioception  Appears Intact    Additional Comments  Burning and numbness down bil hips; denies n/t into hands or feet       Coordination   Gross Motor Movements are Fluid and Coordinated  Yes    Fine Motor Movements are Fluid and Coordinated  Yes      Posture/Postural Control   Postural Limitations  Rounded Shoulders;Forward head;Increased lumbar lordosis      AROM   Overall AROM Comments  Lumbar rotation 75% limited Bil    Lumbar Flexion  10    Lumbar Extension  5    Lumbar - Right Side Bend  50% limited     Lumbar - Left Side Bend  50% limited       Strength   Overall Strength Comments  Bil knee flexion/extension 4-/5 with pain in R knee    Right Hip Flexion  3/5    Right Hip ABduction  4-/5    Right Hip ADduction  3+/5   L hip adduction 4-/5   Left Hip Flexion  3/5    Left Hip ABduction  4-/5      Palpation   Palpation comment  spasming and increased tightness in bil paraspinals and gluts      Special Tests   Other special tests  pt unable to tolerate supine positioning       Ambulation/Gait   Gait Comments  Increased bil knee valgus, decreased hip and knee flexoin, decreased trunk rotation                Objective measurements completed on examination: See above findings.      [place] Adult PT Treatment/Exercise - 10/05/18 0001      Lumbar Exercises: Stretches   Active Hamstring Stretch  2 reps;30 seconds    Active Hamstring Stretch Limitations  seated with cues for netrual spine    Other Lumbar Stretch Exercise  piriformis stretch seated without pull towards chest 2x30sec  Lumbar Exercises: Seated   Other Seated Lumbar Exercises  ab sets 1x5 wit hcues for breathing; ab sets with clam shells 1x10 yellow tband cues for posture             PT Education - 10/05/18 1221    Education provided  Yes    Education Details  New HEP,  performing exercises from PT instead of exercises from TV program, symptom manangement     Person(s) Educated  Patient    Methods  Explanation;Demonstration;Tactile cues;Verbal cues;Handout    Comprehension  Verbalized understanding;Returned demonstration;Verbal cues required       PT Short Term Goals - 10/05/18 1302      PT SHORT TERM GOAL #1   Title  Patient will improve Bil hip flexion to 4-/5    Baseline  R hip flex 3/5; L hip flexion 3/5     Time  4    Period  Weeks    Status  New    Target Date  11/02/18      PT SHORT TERM GOAL #2   Title  Patient will increase lumbar range of motion by 25%     Baseline  Lumbar flexion 10 deg; ext 5 deg; Bil rotation limited 75%    Time  4    Period  Weeks    Status  New    Target Date  11/02/18      PT SHORT TERM GOAL #3   Title  Pt will be able to stand > 30 minutes without increased pain     Baseline  30 minutes increases pain and burning sensation into hips     Time  4    Period  Weeks    Status  New    Target Date  11/02/18        PT Long Term Goals - 10/05/18 1305      PT LONG TERM GOAL #1   Title  Patient will be able to stand >30 minutes without increase pain or burning in bil LE in order to clean house.     Baseline  burning and 10/10 pain 30 minutes or less     Time  8    Period  Weeks    Status  New    Target Date  11/30/18      PT LONG TERM GOAL #2   Title  Pt will increase L gross hip strength to 4+/5 in order to improve transfers and gait on level surfaces    Baseline  trendelenburg gait pattern with ambulation, increased bil knee valgus, increased time required for sit>stand, utilizing chair and mat table to stand    Time  8    Period  Weeks    Status  New    Target Date  11/30/18      PT LONG TERM GOAL #3   Title  Patient will increase lumbar spine motion by 50% in order to complete ADLs     Baseline  Lumbar rotaiton (Bil) limited by 75% , lumbar flexion 10 deg, lumbar ext 5 deg     Time  8    Period   Weeks    Status  New    Target Date  11/30/18             Plan - 10/05/18 1240    Clinical Impression Statement  Pt is a 52 y.o. female presenting with significant decreased active lumbar ROM and Bil LE strength. Upon palpation pt with increased bil paraspinal  and glut tightness, with increased pain upon mild palpation. Pt reports she has not been compliant with HEP since previously discharged in September and now has worsening "burning" into bil hips with increased activity. Pt also reports increased pain and swelling in R knee secondary to descending from a step wrong. Encrouaged pt to visit MD if knee pain persists and/or worsens. Pt will benefit from skilled PT in order to improve lumbar and hip ROM, strength, and functional mobility.    History and Personal Factors relevant to plan of care:  obesity     Clinical Presentation  Evolving    Clinical Presentation due to:  lumbar pain with radiculopathy progressively worsening mobility     Clinical Decision Making  Moderate    Rehab Potential  Good    PT Frequency  2x / week    PT Duration  8 weeks    PT Treatment/Interventions  Gait training;Balance training;Patient/family education;ADLs/Self Care Home Management;Cryotherapy;Electrical Stimulation;Iontophoresis 4mg /ml Dexamethasone;Moist Heat;Traction;Ultrasound;Stair training;Functional mobility training;Therapeutic activities;Therapeutic exercise;Manual techniques;Passive range of motion;Dry needling;Taping;Neuromuscular re-education    PT Next Visit Plan  Assess hip ROM if able to tolerate supine positioning; IASTM to paraspinals and gluts, LAD as able,show tennis ball release, hamstring, piriformis, and SKTC stretch, light core stabilization,clamshells, pelvic tilts, bent knee raises, swiss ball ab sets, swiss ball stretches as needed if unable to tolerate supine, LE strenghtening, decompressive series, modalities as needed    PT Home Exercise Plan  Ab sets, ab sets with clam shells,  Hamstring stretch seated, piriformis stretch seated     Consulted and Agree with Plan of Care  Patient       Patient will benefit from skilled therapeutic intervention in order to improve the following deficits and impairments:  Abnormal gait, Pain, Increased muscle spasms, Decreased endurance, Decreased strength, Decreased range of motion, Decreased activity tolerance, Difficulty walking, Obesity  Visit Diagnosis: Chronic bilateral low back pain with bilateral sciatica  Muscle weakness (generalized)  Difficulty in walking, not elsewhere classified     Problem List Patient Active Problem List   Diagnosis Date Noted  . Notalgia 02/11/2018  . Anemia 02/11/2018  . Chronic right shoulder pain 01/21/2018  . Chronic bilateral low back pain with left-sided sciatica 12/14/2017  . History of stroke 08/13/2017  . Irregular menstrual cycle 08/13/2017  . Iron deficiency anemia 12/12/2016  . Seizure ([place])   . ICH (intracerebral hemorrhage) ([place]) 11/08/2016  . COPD exacerbation ([place]) 05/10/2016  . History of alcohol dependence ([place]) 01/11/2016  . URI (upper respiratory infection) 02/24/2014  . Left leg DVT ([place]) 09/29/2013  . Pre-diabetes 03/02/2013  . History of pulmonary embolism 05/05/2012  . Painful menstrual periods 01/05/2012  . Bipolar disorder ([place]) 07/03/2010  . History of tobacco abuse 08/23/2009  . Hyperlipidemia 04/30/2007  . Class 3 severe obesity due to excess calories with serious comorbidity and body mass index (BMI) of 50.0 to 59.9 in adult ([place]) 01/01/2007  . HYPERTENSION, BENIGN SYSTEMIC 01/01/2007   Carolyne Littles PT DPT  10/05/2018   Einar Crow  SPT 10/05/2018, 1:20 PM   During this treatment session, the therapist was present, participating in and directing the treatment.  [place], [place], [address] Phone: [phone]   Fax:  [phone]  Name: KINLEIGH NAULT MRN: [id number] Date  of Birth: August 02, 1966

## 2018-10-05 NOTE — Patient Instructions (Signed)
Seated Piriformis Stretch reps: 10 sets: 3 daily: 1 weekly: 7   Exercise image step 1   Exercise image step 2  Setup  Begin sitting upright in a chair. Cross one leg over the other so that your ankle is resting on top of your opposite thigh. Movement  Gently pull your bent knee across your body toward your opposite shoulder. You should feel a stretch through the back of your hip and buttocks.  Tip  Try to not to arch your back or lean to one side as you stretch. Seated Hamstring Stretch reps: 10 sets: 3 daily: 1 weekly: 7   Exercise image step 1   Exercise image step 2  Setup  Begin sitting upright with one leg straight forward and your heel resting on the ground. Movement  Bend your trunk forward, hinging at your hips until you feel a stretch in the back of your leg. Hold this position.  Tip  Make sure to keep your knee straight during the stretch and do not let your back arch or slump. Disclaimer: This program provides exercises related to your condition that you can perform at home. As there is a risk of injury with any activity, use caution when performing exercises. If you experience any pain or discomfort, discontinue the exercises and contact your health care provider.  Login URL: [email or web address] . Access Code: [email or web address] . Date printed: 10/05/2018 Page 2  Seated Transversus Abdominis Bracing reps: 10 sets: 3 daily: 1 weekly: 7   Exercise image step 1  Setup  Begin sitting in an upright position with your hands on your lower abdominals. Movement  Slowly draw your navel in toward your spine, bracing your deep abdominal muscles. Hold, then relax and repeat. Tip  Make sure to sit tall throughout the exercise. Avoid bending your trunk forward and do not hold your breath. Seated Hip Abduction reps: 10 sets: 3 daily: 1 weekly: 7   Exercise image step 1   Exercise image step 2  Setup  Begin sitting upright in a chair. Movement  Push your legs outward, keeping  your feet flat on the ground, then slowly bring them back together and repeat. Tip  Make sure to keep your movements slow and controlled and continue breathing evenly during the exercise.

## 2018-10-09 ENCOUNTER — Ambulatory Visit (INDEPENDENT_AMBULATORY_CARE_PROVIDER_SITE_OTHER): Payer: Self-pay | Admitting: Family Medicine

## 2018-10-09 ENCOUNTER — Other Ambulatory Visit: Payer: Self-pay

## 2018-10-09 VITALS — BP 144/80 | HR 76 | Temp 98.8°F | Ht 64.0 in | Wt 311.6 lb

## 2018-10-09 DIAGNOSIS — J441 Chronic obstructive pulmonary disease with (acute) exacerbation: Secondary | ICD-10-CM

## 2018-10-09 MED ORDER — AZITHROMYCIN 250 MG PO TABS: ORAL_TABLET | ORAL | 0 refills | Status: DC

## 2018-10-09 MED ORDER — PREDNISONE 20 MG PO TABS: ORAL_TABLET | ORAL | 0 refills | Status: DC

## 2018-10-09 NOTE — Patient Instructions (Addendum)
It was great meeting you today! I am sorry you have been struggling with your bronchitis. It is likely due to the cold temperature as well as the smoke exposure. When you have people visit you can have them smoke outside with a rain jacket on, to prevent it getting on your clothes.   I will send in a zpak and some steroids. You will take each of these for 5 days. Please let me know if you are not feeling any better by that time and we can discuss further options.

## 2018-10-12 NOTE — Progress Notes (Signed)
   HPI 52 year old who presents for "bronchitis". She states that for the last 3-4 days she has had increasing wheezing and shortness of breath along with a productive cough. It first started when she had family come and visit. She was exposed to smoke exposure during this visit and states that her symptoms started that night. Patient is a former smoker but has not smoked in several years. She states that the cold weather is occasionally a trigger for her as well.  A z-pak and prednisone usually work well for her. In the past she has received 40mg  daily for 5 days. Has been doing 2 puffs every 4 hours for last 3 days without much improvement.  CC: cough, wheezing   ROS:  Review of Systems See HPI for ROS.   CC, SH/smoking status, and VS noted  Objective: BP (!) 144/80   Pulse 76   Temp 98.8 F (37.1 C) (Oral)   Ht 5\' 4"  (1.626 m)   Wt (!) 311 lb 9.6 oz (141.3 kg)   SpO2 98%   BMI 53.49 kg/m  Gen:  Pleasant 52 year old AA female, no acute distress, audible wheezing HEENT: NCAT, EOMI, PERRL. Dry mucous membranes CV: RRR, no murmur. Resp: diffuse end expiratory wheezing in all lung fields. No accessory muscle use, no distress. Audible wheezing without stethescope. Abd: SNTND, BS present, no guarding or organomegaly Ext: No edema, warm Neuro: Alert and oriented, Speech clear, No gross deficits   Assessment and plan:  COPD exacerbation (HCC) Likely copd exacerbation in context of smoke exposure and weather. Will give z-pak and 40mg  daily prednisone for 5 days. Follow up as needed. Albuterol as needed. Question if patient would benefit from controller med. Likely GOLD C based on PFTs and symptomatology, but will defer to PCP.   No orders of the defined types were placed in this encounter.   Meds ordered this encounter  Medications  . azithromycin (ZITHROMAX) 250 MG tablet    Sig: Take 2 tablets on day 1, and 1 tablet each day after    Dispense:  6 tablet    Refill:  0  .  predniSONE (DELTASONE) 20 MG tablet    Sig: Take 2 tablets (20mg  each) with breakfast each day for 5 days    Dispense:  10 tablet    Refill:  0     Guadalupe [name] MD PGY-2 Family Medicine Resident  10/12/2018 5:44 PM

## 2018-10-12 NOTE — Assessment & Plan Note (Signed)
Likely copd exacerbation in context of smoke exposure and weather. Will give z-pak and 40mg  daily prednisone for 5 days. Follow up as needed. Albuterol as needed. Question if patient would benefit from controller med. Likely GOLD C based on PFTs and symptomatology, but will defer to PCP.

## 2018-10-14 ENCOUNTER — Encounter

## 2018-10-15 ENCOUNTER — Ambulatory Visit: Payer: Medicaid Other | Admitting: Physical Therapy

## 2018-10-15 ENCOUNTER — Encounter: Payer: Self-pay | Admitting: Physical Therapy

## 2018-10-15 DIAGNOSIS — G8929 Other chronic pain: Secondary | ICD-10-CM

## 2018-10-15 DIAGNOSIS — R262 Difficulty in walking, not elsewhere classified: Secondary | ICD-10-CM

## 2018-10-15 DIAGNOSIS — M6281 Muscle weakness (generalized): Secondary | ICD-10-CM

## 2018-10-15 DIAGNOSIS — M5442 Lumbago with sciatica, left side: Principal | ICD-10-CM

## 2018-10-15 DIAGNOSIS — M5441 Lumbago with sciatica, right side: Principal | ICD-10-CM

## 2018-10-15 NOTE — Therapy (Signed)
[place], [place], [address] Phone: [phone]   Fax:  [phone]  Physical Therapy Treatment  Patient Details  Name: Carla Hanson MRN: [id number] Date of Birth: Aug 18, 1966 Referring Provider (PT): Steve Rattler ,DO   Encounter Date: 10/15/2018  PT End of Session - 10/15/18 0954    Visit Number  2    Number of Visits  16    Date for PT Re-Evaluation  11/30/18    Authorization Type  CFA    PT Start Time  0822   Patient 15 minutes late and therapy not paged when she did come `   PT Stop Time  0845    PT Time Calculation (min)  23 min    Activity Tolerance  Patient tolerated treatment well    Behavior During Therapy  [facility] for tasks assessed/performed       Past Medical History:  Diagnosis Date  . Anxiety   . Bipolar disorder ([place])   . Hyperlipidemia   . Hypertension   . Lipoma    Abdomen  . LIPOMA 01/20/2008  . Obesity   . Pneumonia   . Tobacco abuse     Past Surgical History:  Procedure Laterality Date  . CESAREAN SECTION    . CYSTECTOMY    . LIPOMA EXCISION  03/2011  . ORIF ANKLE FRACTURE  03/13/2012   Procedure: OPEN REDUCTION INTERNAL FIXATION (ORIF) ANKLE FRACTURE;  Surgeon: Jessy Oto, MD;  Location: [facility];  Service: Orthopedics;  Laterality: Right;    There were no vitals filed for this visit.  Subjective Assessment - 10/15/18 0826    Subjective  Patient reports her pain was about a 5/10 this morning. She is sore form doing a workout video. She feels feels like the stretching has helped.     Pertinent History  ICH, seizure, ORIF right ankle 03/13/2012, BiPolar, HTN, Lipoma, obesity, DVT, COPD    Limitations  Lifting;Standing;Walking    How long can you stand comfortably?  30 mins    How long can you walk comfortably?  30 mins    Diagnostic tests  shifting to the right     Patient Stated Goals  decrease pain to walk with grandchildren and clean    Currently in Pain?  Yes     Pain Score  5     Pain Location  Back    Pain Orientation  Right;Left;Lower    Pain Descriptors / Indicators  Aching;Tightness    Pain Type  Chronic pain    Pain Onset  More than a month ago    Pain Frequency  Intermittent    Aggravating Factors   walking, bending, cleaning, shopping     Pain Relieving Factors  heat     Effect of Pain on Daily Activities  limits activity                        [facility] Adult PT Treatment/Exercise - 10/15/18 0001      Lumbar Exercises: Stretches   Active Hamstring Stretch  2 reps;30 seconds    Active Hamstring Stretch Limitations  seated with cues for netrual spine      Lumbar Exercises: Seated   Other Seated Lumbar Exercises  seated on air-ex bilateral er 2x10 yellow; seated adduction x10 yellow;     Other Seated Lumbar Exercises  seated ball roll x10; seated diagnal x5 each; ball press x10;  PT Education - 10/15/18 0954    Education provided  Yes    Education Details  reviewed the improtance of HEP and the importance of coming ontime     Person(s) Educated  Patient    Methods  Explanation;Demonstration;Tactile cues    Comprehension  Verbalized understanding;Returned demonstration;Verbal cues required;Tactile cues required       PT Short Term Goals - 10/05/18 1302      PT SHORT TERM GOAL #1   Title  Patient will improve Bil hip flexion to 4-/5    Baseline  R hip flex 3/5; L hip flexion 3/5     Time  4    Period  Weeks    Status  New    Target Date  11/02/18      PT SHORT TERM GOAL #2   Title  Patient will increase lumbar range of motion by 25%     Baseline  Lumbar flexion 10 deg; ext 5 deg; Bil rotation limited 75%    Time  4    Period  Weeks    Status  New    Target Date  11/02/18      PT SHORT TERM GOAL #3   Title  Pt will be able to stand > 30 minutes without increased pain     Baseline  30 minutes increases pain and burning sensation into hips     Time  4    Period  Weeks    Status  New     Target Date  11/02/18        PT Long Term Goals - 10/05/18 1305      PT LONG TERM GOAL #1   Title  Patient will be able to stand >30 minutes without increase pain or burning in bil LE in order to clean house.     Baseline  burning and 10/10 pain 30 minutes or less     Time  8    Period  Weeks    Status  New    Target Date  11/30/18      PT LONG TERM GOAL #2   Title  Pt will increase L gross hip strength to 4+/5 in order to improve transfers and gait on level surfaces    Baseline  trendelenburg gait pattern with ambulation, increased bil knee valgus, increased time required for sit>stand, utilizing chair and mat table to stand    Time  8    Period  Weeks    Status  New    Target Date  11/30/18      PT LONG TERM GOAL #3   Title  Patient will increase lumbar spine motion by 50% in order to complete ADLs     Baseline  Lumbar rotaiton (Bil) limited by 75% , lumbar flexion 10 deg, lumbar ext 5 deg     Time  8    Period  Weeks    Status  New    Target Date  11/30/18            Plan - 10/15/18 0955    Clinical Impression Statement  Therapy worked on ball stretching and core balance activity> She reported feeling no increase in pain but she could feel her abdominal muscles. Therapy was limited today by time. No manual therapy perfromed 2nd to patient being late. Pateint needs to focus on ther-ex. She was advised not to do any videos that,make her hurt more.     Clinical Presentation  Evolving    Clinical  Decision Making  Moderate    Rehab Potential  Good    PT Frequency  2x / week    PT Duration  8 weeks    PT Treatment/Interventions  Gait training;Balance training;Patient/family education;ADLs/Self Care Home Management;Cryotherapy;Electrical Stimulation;Iontophoresis 4mg /ml Dexamethasone;Moist Heat;Traction;Ultrasound;Stair training;Functional mobility training;Therapeutic activities;Therapeutic exercise;Manual techniques;Passive range of motion;Dry  needling;Taping;Neuromuscular re-education    PT Next Visit Plan  Assess hip ROM if able to tolerate supine positioning; IASTM to paraspinals and gluts, LAD as able,show tennis ball release, hamstring, piriformis, and SKTC stretch, light core stabilization,clamshells, pelvic tilts, bent knee raises, swiss ball ab sets, swiss ball stretches as needed if unable to tolerate supine, LE strenghtening, decompressive series, modalities as needed    PT Home Exercise Plan  Ab sets, ab sets with clam shells, Hamstring stretch seated, piriformis stretch seated        Patient will benefit from skilled therapeutic intervention in order to improve the following deficits and impairments:  Abnormal gait, Pain, Increased muscle spasms, Decreased endurance, Decreased strength, Decreased range of motion, Decreased activity tolerance, Difficulty walking, Obesity  Visit Diagnosis: Chronic bilateral low back pain with bilateral sciatica  Muscle weakness (generalized)  Difficulty in walking, not elsewhere classified     Problem List Patient Active Problem List   Diagnosis Date Noted  . Notalgia 02/11/2018  . Anemia 02/11/2018  . Chronic right shoulder pain 01/21/2018  . Chronic bilateral low back pain with left-sided sciatica 12/14/2017  . History of stroke 08/13/2017  . Irregular menstrual cycle 08/13/2017  . Iron deficiency anemia 12/12/2016  . Seizure ([place])   . ICH (intracerebral hemorrhage) ([place]) 11/08/2016  . COPD exacerbation ([place]) 05/10/2016  . Acute bronchitis 04/03/2016  . History of alcohol dependence ([place]) 01/11/2016  . URI (upper respiratory infection) 02/24/2014  . Left leg DVT ([place]) 09/29/2013  . Pre-diabetes 03/02/2013  . History of pulmonary embolism 05/05/2012  . Painful menstrual periods 01/05/2012  . Bipolar disorder ([place]) 07/03/2010  . History of tobacco abuse 08/23/2009  . Hyperlipidemia 04/30/2007  . Class 3 severe obesity due to excess calories with serious comorbidity and  body mass index (BMI) of 50.0 to 59.9 in adult ([place]) 01/01/2007  . HYPERTENSION, BENIGN SYSTEMIC 01/01/2007    Carney Living PT DPT  10/15/2018, 1:15 PM  [facility] [address] [place], [place], [address] Phone: [phone]   Fax:  [phone]  Name: Carla Hanson MRN: [id number] Date of Birth: 1965/11/19

## 2018-10-20 ENCOUNTER — Ambulatory Visit: Payer: Self-pay | Admitting: Gastroenterology

## 2018-10-20 ENCOUNTER — Ambulatory Visit: Payer: Medicaid Other | Admitting: Physical Therapy

## 2018-10-22 ENCOUNTER — Ambulatory Visit: Payer: Medicaid Other | Admitting: Physical Therapy

## 2018-10-27 ENCOUNTER — Ambulatory Visit: Payer: Medicaid Other | Admitting: Physical Therapy

## 2018-10-27 ENCOUNTER — Encounter: Payer: Self-pay | Admitting: Physical Therapy

## 2018-10-27 DIAGNOSIS — R262 Difficulty in walking, not elsewhere classified: Secondary | ICD-10-CM

## 2018-10-27 DIAGNOSIS — G8929 Other chronic pain: Secondary | ICD-10-CM

## 2018-10-27 DIAGNOSIS — M5441 Lumbago with sciatica, right side: Principal | ICD-10-CM

## 2018-10-27 DIAGNOSIS — M5442 Lumbago with sciatica, left side: Principal | ICD-10-CM

## 2018-10-27 DIAGNOSIS — R2681 Unsteadiness on feet: Secondary | ICD-10-CM

## 2018-10-27 DIAGNOSIS — M6281 Muscle weakness (generalized): Secondary | ICD-10-CM

## 2018-10-27 NOTE — Patient Instructions (Signed)
Hamstring Curl: Resisted (Sitting)    Facing anchor with tubing on right ankle, leg straight out, bend knee. Repeat __10__ times per set. Do __ 1 to 3 __ sets per session. Do _1___ sessions per day.   http://orth.exer.us/669   Copyright  VHI. All rights reserved.

## 2018-10-27 NOTE — Therapy (Signed)
[place], [place], [address] Phone: [phone]   Fax:  [phone]  Physical Therapy Treatment  Patient Details  Name: Carla Hanson MRN: [id number] Date of Birth: 12-Sep-1966 Referring Provider (PT): Steve Rattler ,DO   Encounter Date: 10/27/2018  PT End of Session - 10/27/18 1236    Visit Number  3    Number of Visits  16    Date for PT Re-Evaluation  11/30/18    Authorization Type  CFA    PT Start Time  1105    PT Stop Time  1146    PT Time Calculation (min)  41 min    Activity Tolerance  Patient tolerated treatment well    Behavior During Therapy  [facility] for tasks assessed/performed       Past Medical History:  Diagnosis Date  . Anxiety   . Bipolar disorder ([place])   . Hyperlipidemia   . Hypertension   . Lipoma    Abdomen  . LIPOMA 01/20/2008  . Obesity   . Pneumonia   . Tobacco abuse     Past Surgical History:  Procedure Laterality Date  . CESAREAN SECTION    . CYSTECTOMY    . LIPOMA EXCISION  03/2011  . ORIF ANKLE FRACTURE  03/13/2012   Procedure: OPEN REDUCTION INTERNAL FIXATION (ORIF) ANKLE FRACTURE;  Surgeon: Jessy Oto, MD;  [facility];  Service: Orthopedics;  Laterality: Right;    There were no vitals filed for this visit.  Subjective Assessment - 10/27/18 1107    Subjective    More sore than pain.  sleeping just fine    Currently in Pain?  Yes    Pain Score  3     Pain Location  Back    Pain Orientation  Right;Left;Lower    Pain Descriptors / Indicators  Sore    Pain Frequency  Intermittent                       [facility] Adult PT Treatment/Exercise - 10/27/18 0001      Lumbar Exercises: Aerobic   Nustep  7 minutes      Lumbar Exercises: Machines for Strengthening   Leg Press  1 plate 30 X cued initially      Lumbar Exercises: Seated   Long Arc Quad on Chair  Both;20 reps   25   Other Seated Lumbar Exercises  sitting on ball pelvic  mobility,  close SBA  stabilization  march,  bounce bounce and reach   abdominal isometrics.        Knee/Hip Exercises: Seated   Hamstring Curl  Both;2 sets;10 reps;Strengthening    Hamstring Limitations  green band with core activation               PT Education - 10/27/18 1128    Education provided  Yes    Education Details  HEP    Person(s) Educated  Patient    Methods  Explanation;Demonstration;Tactile cues;Verbal cues;Handout    Comprehension  Verbalized understanding;Returned demonstration       PT Short Term Goals - 10/27/18 1239      PT SHORT TERM GOAL #1   Title  Patient will improve Bil hip flexion to 4-/5    Baseline  addressing with exercise    Time  4    Period  Weeks    Status  On-going      PT SHORT TERM GOAL #2  Title  Patient will increase lumbar range of motion by 25%     Time  4    Period  Weeks    Status  Unable to assess      PT SHORT TERM GOAL #3   Title  Pt will be able to stand > 30 minutes without increased pain     Baseline  washing dishes at sink challanging    Time  4    Period  Weeks    Status  Unable to assess      PT SHORT TERM GOAL #4   Baseline  working on with exercise    Time  4    Period  Weeks    Status  On-going        PT Long Term Goals - 10/05/18 1305      PT LONG TERM GOAL #1   Title  Patient will be able to stand >30 minutes without increase pain or burning in bil LE in order to clean house.     Baseline  burning and 10/10 pain 30 minutes or less     Time  8    Period  Weeks    Status  New    Target Date  11/30/18      PT LONG TERM GOAL #2   Title  Pt will increase L gross hip strength to 4+/5 in order to improve transfers and gait on level surfaces    Baseline  trendelenburg gait pattern with ambulation, increased bil knee valgus, increased time required for sit>stand, utilizing chair and mat table to stand    Time  8    Period  Weeks    Status  New    Target Date  11/30/18      PT LONG TERM GOAL #3    Title  Patient will increase lumbar spine motion by 50% in order to complete ADLs     Baseline  Lumbar rotaiton (Bil) limited by 75% , lumbar flexion 10 deg, lumbar ext 5 deg     Time  8    Period  Weeks    Status  New    Target Date  11/30/18            Plan - 10/27/18 1117    Clinical Impression Statement  Patient likes leg press.  stabilization focus with soreness from new exercises.  It is a good soreness.  i am going to invest in getting a ball.     PT Next Visit Plan  Assess hip ROM if able to tolerate supine positioning; leg presses,  sitting on ball.  IASTM to paraspinals and gluts, LAD as able,show tennis ball release, hamstring, piriformis, and SKTC stretch, light core stabilization,clamshells, pelvic tilts, bent knee raises, swiss ball ab sets, swiss ball stretches as needed if unable to tolerate supine, LE strenghtening, decompressive series, modalities as needed    PT Home Exercise Plan  Ab sets, ab sets with clam shells, Hamstring stretch seated, piriformis stretch seated  hamstring curls.    Consulted and Agree with Plan of Care  Patient       Patient will benefit from skilled therapeutic intervention in order to improve the following deficits and impairments:     Visit Diagnosis: Chronic bilateral low back pain with bilateral sciatica  Muscle weakness (generalized)  Difficulty in walking, not elsewhere classified  Unsteadiness on feet     Problem List Patient Active Problem List   Diagnosis Date Noted  . Notalgia 02/11/2018  .  Anemia 02/11/2018  . Chronic right shoulder pain 01/21/2018  . Chronic bilateral low back pain with left-sided sciatica 12/14/2017  . History of stroke 08/13/2017  . Irregular menstrual cycle 08/13/2017  . Iron deficiency anemia 12/12/2016  . Seizure ([place])   . ICH (intracerebral hemorrhage) ([place]) 11/08/2016  . COPD exacerbation ([place]) 05/10/2016  . Acute bronchitis 04/03/2016  . History of alcohol dependence ([place]) 01/11/2016  .  URI (upper respiratory infection) 02/24/2014  . Left leg DVT ([place]) 09/29/2013  . Pre-diabetes 03/02/2013  . History of pulmonary embolism 05/05/2012  . Painful menstrual periods 01/05/2012  . Bipolar disorder ([place]) 07/03/2010  . History of tobacco abuse 08/23/2009  . Hyperlipidemia 04/30/2007  . Class 3 severe obesity due to excess calories with serious comorbidity and body mass index (BMI) of 50.0 to 59.9 in adult ([place]) 01/01/2007  . HYPERTENSION, BENIGN SYSTEMIC 01/01/2007    Maykayla Highley PTA 10/27/2018, 12:41 PM  [facility] [address] [place], [place], [address] Phone: [phone]   Fax:  [phone]  Name: Carla Hanson MRN: [id number] Date of Birth: [birth year]

## 2018-10-29 ENCOUNTER — Encounter: Payer: Self-pay | Admitting: Physical Therapy

## 2018-10-29 ENCOUNTER — Ambulatory Visit: Payer: Medicaid Other | Admitting: Physical Therapy

## 2018-10-29 DIAGNOSIS — R262 Difficulty in walking, not elsewhere classified: Secondary | ICD-10-CM

## 2018-10-29 DIAGNOSIS — M6281 Muscle weakness (generalized): Secondary | ICD-10-CM

## 2018-10-29 DIAGNOSIS — M5442 Lumbago with sciatica, left side: Principal | ICD-10-CM

## 2018-10-29 DIAGNOSIS — M5441 Lumbago with sciatica, right side: Principal | ICD-10-CM

## 2018-10-29 DIAGNOSIS — G8929 Other chronic pain: Secondary | ICD-10-CM

## 2018-10-30 ENCOUNTER — Encounter: Payer: Self-pay | Admitting: Physical Therapy

## 2018-10-30 NOTE — Therapy (Signed)
[place], [place], [address] Phone: [phone]   Fax:  [phone]  Physical Therapy Treatment  Patient Details  Name: Carla Hanson MRN: [id number] Date of Birth: 30-May-1966 Referring Provider (PT): Steve Rattler ,DO   Encounter Date: 10/29/2018  PT End of Session - 10/29/18 1422    Visit Number  4    Number of Visits  16    Date for PT Re-Evaluation  11/30/18    Authorization Type  CFA    PT Start Time  1416    PT Stop Time  1506    PT Time Calculation (min)  50 min    Activity Tolerance  Patient tolerated treatment well    Behavior During Therapy  [facility] for tasks assessed/performed       Past Medical History:  Diagnosis Date  . Anxiety   . Bipolar disorder ([place])   . Hyperlipidemia   . Hypertension   . Lipoma    Abdomen  . LIPOMA 01/20/2008  . Obesity   . Pneumonia   . Tobacco abuse     Past Surgical History:  Procedure Laterality Date  . CESAREAN SECTION    . CYSTECTOMY    . LIPOMA EXCISION  03/2011  . ORIF ANKLE FRACTURE  03/13/2012   Procedure: OPEN REDUCTION INTERNAL FIXATION (ORIF) ANKLE FRACTURE;  Surgeon: Jessy Oto, MD;  [facility];  Service: Orthopedics;  Laterality: Right;    There were no vitals filed for this visit.  Subjective Assessment - 10/29/18 1420    Subjective  Patient reports her back is sore but not painful. When asked the pain scale she says no pain. She feels the soreness in the lower part of her back.     Pertinent History  ICH, seizure, ORIF right ankle 03/13/2012, BiPolar, HTN, Lipoma, obesity, DVT, COPD    Limitations  Lifting;Standing;Walking    How long can you stand comfortably?  30 mins    How long can you walk comfortably?  30 mins    Diagnostic tests  shifting to the right     Patient Stated Goals  decrease pain to walk with grandchildren and clean    Currently in Pain?  No/denies                       [facility] Adult PT  Treatment/Exercise - 10/30/18 0001      Lumbar Exercises: Stretches   Active Hamstring Stretch  2 reps;30 seconds    Active Hamstring Stretch Limitations  seated with cues for netrual spine      Lumbar Exercises: Aerobic   Nustep  5 min       Lumbar Exercises: Machines for Strengthening   Leg Press  1 plate 30 X cued initially      Lumbar Exercises: Seated   Long Arc Quad on Chair  Both;20 reps   25   Other Seated Lumbar Exercises  seated ball roll x10; seated diagnal x5 each; ball press x10;       Knee/Hip Exercises: Standing   Heel Raises  20 reps    Hip ADduction  2 sets;10 reps    Hip Extension  1 set;15 reps      Knee/Hip Exercises: Seated   Ball Squeeze  x20     Clamshell with TheraBand  Green   x20    Hamstring Curl  Both;2 sets;10 reps;Strengthening    Hamstring Limitations  green band with core activation  Moist Heat Therapy   Number Minutes Moist Heat  10 Minutes    Moist Heat Location  Lumbar Spine             PT Education - 10/29/18 1422    Education provided  Yes    Education Details  reviewed technique with ther-ex     Person(s) Educated  Patient    Methods  Demonstration;Tactile cues;Verbal cues;Explanation    Comprehension  Verbalized understanding;Returned demonstration;Verbal cues required;Tactile cues required       PT Short Term Goals - 10/27/18 1239      PT SHORT TERM GOAL #1   Title  Patient will improve Bil hip flexion to 4-/5    Baseline  addressing with exercise    Time  4    Period  Weeks    Status  On-going      PT SHORT TERM GOAL #2   Title  Patient will increase lumbar range of motion by 25%     Time  4    Period  Weeks    Status  Unable to assess      PT SHORT TERM GOAL #3   Title  Pt will be able to stand > 30 minutes without increased pain     Baseline  washing dishes at sink challanging    Time  4    Period  Weeks    Status  Unable to assess      PT SHORT TERM GOAL #4   Baseline  working on with exercise     Time  4    Period  Weeks    Status  On-going        PT Long Term Goals - 10/05/18 1305      PT LONG TERM GOAL #1   Title  Patient will be able to stand >30 minutes without increase pain or burning in bil LE in order to clean house.     Baseline  burning and 10/10 pain 30 minutes or less     Time  8    Period  Weeks    Status  New    Target Date  11/30/18      PT LONG TERM GOAL #2   Title  Pt will increase L gross hip strength to 4+/5 in order to improve transfers and gait on level surfaces    Baseline  trendelenburg gait pattern with ambulation, increased bil knee valgus, increased time required for sit>stand, utilizing chair and mat table to stand    Time  8    Period  Weeks    Status  New    Target Date  11/30/18      PT LONG TERM GOAL #3   Title  Patient will increase lumbar spine motion by 50% in order to complete ADLs     Baseline  Lumbar rotaiton (Bil) limited by 75% , lumbar flexion 10 deg, lumbar ext 5 deg     Time  8    Period  Weeks    Status  New    Target Date  11/30/18            Plan - 10/30/18 0814    Clinical Impression Statement  Patient tolerated ther-ex well. She reported a " different" feeling while perfroming standing ther-ex. It is questionable whether she is doing the exer cises that we are giving her or just doing work out videoes from [employer]. She was encouraged to do her specific core strengthening exercies.  Clinical Presentation  Evolving    Clinical Decision Making  Moderate    Rehab Potential  Good    PT Frequency  2x / week    PT Duration  8 weeks    PT Treatment/Interventions  Gait training;Balance training;Patient/family education;ADLs/Self Care Home Management;Cryotherapy;Electrical Stimulation;Iontophoresis 4mg /ml Dexamethasone;Moist Heat;Traction;Ultrasound;Stair training;Functional mobility training;Therapeutic activities;Therapeutic exercise;Manual techniques;Passive range of motion;Dry needling;Taping;Neuromuscular  re-education    PT Next Visit Plan  Assess hip ROM if able to tolerate supine positioning; leg presses,  sitting on ball.  IASTM to paraspinals and gluts, LAD as able,show tennis ball release, hamstring, piriformis, and SKTC stretch, light core stabilization,clamshells, pelvic tilts, bent knee raises, swiss ball ab sets, swiss ball stretches as needed if unable to tolerate supine, LE strenghtening, decompressive series, modalities as needed    PT Home Exercise Plan  Ab sets, ab sets with clam shells, Hamstring stretch seated, piriformis stretch seated  hamstring curls.    Consulted and Agree with Plan of Care  Patient       Patient will benefit from skilled therapeutic intervention in order to improve the following deficits and impairments:  Abnormal gait, Pain, Increased muscle spasms, Decreased endurance, Decreased strength, Decreased range of motion, Decreased activity tolerance, Difficulty walking, Obesity  Visit Diagnosis: Chronic bilateral low back pain with bilateral sciatica  Muscle weakness (generalized)  Difficulty in walking, not elsewhere classified     Problem List Patient Active Problem List   Diagnosis Date Noted  . Notalgia 02/11/2018  . Anemia 02/11/2018  . Chronic right shoulder pain 01/21/2018  . Chronic bilateral low back pain with left-sided sciatica 12/14/2017  . History of stroke 08/13/2017  . Irregular menstrual cycle 08/13/2017  . Iron deficiency anemia 12/12/2016  . Seizure ([place])   . ICH (intracerebral hemorrhage) ([place]) 11/08/2016  . COPD exacerbation ([place]) 05/10/2016  . Acute bronchitis 04/03/2016  . History of alcohol dependence ([place]) 01/11/2016  . URI (upper respiratory infection) 02/24/2014  . Left leg DVT ([place]) 09/29/2013  . Pre-diabetes 03/02/2013  . History of pulmonary embolism 05/05/2012  . Painful menstrual periods 01/05/2012  . Bipolar disorder ([place]) 07/03/2010  . History of tobacco abuse 08/23/2009  . Hyperlipidemia 04/30/2007  . Class 3  severe obesity due to excess calories with serious comorbidity and body mass index (BMI) of 50.0 to 59.9 in adult ([place]) 01/01/2007  . HYPERTENSION, BENIGN SYSTEMIC 01/01/2007    Carney Living PT DPT  10/30/2018, 9:38 AM  [facility] [address] [place], [place], [address] Phone: [phone]   Fax:  [phone]  Name: Carla Hanson MRN: [id number] Date of Birth: [birth year]

## 2018-11-03 ENCOUNTER — Other Ambulatory Visit: Payer: Self-pay | Admitting: Oncology

## 2018-11-03 ENCOUNTER — Ambulatory Visit: Payer: Medicaid Other | Admitting: Physical Therapy

## 2018-11-03 ENCOUNTER — Encounter: Payer: Self-pay | Admitting: Physical Therapy

## 2018-11-03 DIAGNOSIS — Z86711 Personal history of pulmonary embolism: Secondary | ICD-10-CM

## 2018-11-03 DIAGNOSIS — M6281 Muscle weakness (generalized): Secondary | ICD-10-CM

## 2018-11-03 DIAGNOSIS — R2681 Unsteadiness on feet: Secondary | ICD-10-CM

## 2018-11-03 DIAGNOSIS — G8929 Other chronic pain: Secondary | ICD-10-CM

## 2018-11-03 DIAGNOSIS — I825Y2 Chronic embolism and thrombosis of unspecified deep veins of left proximal lower extremity: Secondary | ICD-10-CM

## 2018-11-03 DIAGNOSIS — I611 Nontraumatic intracerebral hemorrhage in hemisphere, cortical: Secondary | ICD-10-CM

## 2018-11-03 DIAGNOSIS — R262 Difficulty in walking, not elsewhere classified: Secondary | ICD-10-CM

## 2018-11-03 DIAGNOSIS — D689 Coagulation defect, unspecified: Secondary | ICD-10-CM

## 2018-11-03 DIAGNOSIS — M5442 Lumbago with sciatica, left side: Secondary | ICD-10-CM | POA: Diagnosis not present

## 2018-11-03 DIAGNOSIS — M5441 Lumbago with sciatica, right side: Principal | ICD-10-CM

## 2018-11-03 NOTE — Therapy (Addendum)
[name], [place], [address] Phone: [phone]   Fax:  [phone]  Physical Therapy Treatment/Discharge   Patient Details  Name: Carla Hanson MRN: [id number] Date of Birth: [birth year] Referring Provider (PT): Steve Rattler ,DO   Encounter Date: 11/03/2018  PT End of Session - 11/03/18 1249    Visit Number  5    Number of Visits  16    Date for PT Re-Evaluation  11/30/18    Authorization Type  CFA    PT Start Time  1103    PT Stop Time  1145    PT Time Calculation (min)  42 min    Activity Tolerance  Patient tolerated treatment well    Behavior During Therapy  [facility] for tasks assessed/performed       Past Medical History:  Diagnosis Date  . Anxiety   . Bipolar disorder ([place])   . Hyperlipidemia   . Hypertension   . Lipoma    Abdomen  . LIPOMA 01/20/2008  . Obesity   . Pneumonia   . Tobacco abuse     Past Surgical History:  Procedure Laterality Date  . CESAREAN SECTION    . CYSTECTOMY    . LIPOMA EXCISION  03/2011  . ORIF ANKLE FRACTURE  03/13/2012   Procedure: OPEN REDUCTION INTERNAL FIXATION (ORIF) ANKLE FRACTURE;  Surgeon: Jessy Oto, MD;  Location: [facility];  Service: Orthopedics;  Laterality: Right;    There were no vitals filed for this visit.  Subjective Assessment - 11/03/18 1107    Subjective  patient forgot to tell us that she fell prior to starting PT. She sat in chair and it broke and she landed right hip.  Needed help to get up.   Middle of back felt jammed.  That is waht made my back a little sore.      Currently in Pain?  Yes    Pain Score  --   no number.  Not hurting hurting  just sore   Pain Location  Back    Pain Orientation  Right;Left;Lower   RT > LT   Pain Descriptors / Indicators  Sore    Pain Type  Chronic pain    Aggravating Factors   hips stiff    Pain Score  8    Pain Location  Knee    Pain Orientation  Right;Left;Anterior    Pain Descriptors /  Indicators  Aching    Pain Radiating Towards  into shin    Aggravating Factors   getting up in the morning    Pain Relieving Factors  moving around ,  stretches.          [facility] PT Assessment - 11/03/18 0001      AROM   Lumbar Flexion  able to reach 8 inches from floor   end range pain                  [facility] Adult PT Treatment/Exercise - 11/03/18 0001      Lumbar Exercises: Stretches   Active Hamstring Stretch  2 reps;30 seconds    Active Hamstring Stretch Limitations  seated with cues for netrual spine    Double Knee to Chest Stretch  10 seconds;5 reps    Double Knee to Chest Stretch Limitations  AA legs on red ball    Lower Trunk Rotation  5 reps    Lower Trunk Rotation Limitations  legs on red ball cues initially  Lumbar Exercises: Seated   Other Seated Lumbar Exercises  march with abdominal work,       Lumbar Exercises: Supine   Bent Knee Raise  10 reps    Bent Knee Raise Limitations  lifting legs from red ball     Dead Bug Limitations  modified with legs lifting from red ball    Bridge Limitations  6 X twinge in low back,  legs over red ball    Other Supine Lumbar Exercises  hip adductor squeeze 10 x 2 sets      Knee/Hip Exercises: Stretches   Passive Hamstring Stretch  3 reps;30 seconds;Both    Other Knee/Hip Stretches  Trunk rotation 10 seconds holds 3 X.  trunk flexion X 1 10 seconds reach toward ground.        Knee/Hip Exercises: Seated   Other Seated Knee/Hip Exercises  df 10 X sitting             PT Education - 11/03/18 1249    Education provided  Yes    Education Details  exercise form    Person(s) Educated  Patient    Methods  Explanation;Demonstration;Tactile cues;Verbal cues    Comprehension  Returned demonstration;Verbalized understanding       PT Short Term Goals - 11/03/18 1118      PT SHORT TERM GOAL #1   Title  Patient will improve Bil hip flexion to 4-/5    Baseline  4- LT 4/5 right    Time  4    Period  Weeks     Status  Achieved      PT SHORT TERM GOAL #2   Title  Patient will increase lumbar range of motion by 25%     Baseline  ,  reaches 6 inches from floor while sitting  reaching 8 inches from floor from standing  10/10 pain  per report vs physical signs    Time  4    Period  Weeks    Status  On-going      PT SHORT TERM GOAL #3   Title  Pt will be able to stand > 30 minutes without increased pain     Baseline  5-10 minutes    Time  4    Period  Weeks    Status  On-going      PT SHORT TERM GOAL #4   Title  Patient will increase bilateral hip strength to 4+/5 gross     Baseline  working on with exercise    Time  4    Period  Weeks    Status  On-going        PT Long Term Goals - 10/05/18 1305      PT LONG TERM GOAL #1   Title  Patient will be able to stand >30 minutes without increase pain or burning in bil LE in order to clean house.     Baseline  burning and 10/10 pain 30 minutes or less     Time  8    Period  Weeks    Status  New    Target Date  11/30/18      PT LONG TERM GOAL #2   Title  Pt will increase L gross hip strength to 4+/5 in order to improve transfers and gait on level surfaces    Baseline  trendelenburg gait pattern with ambulation, increased bil knee valgus, increased time required for sit>stand, utilizing chair and mat table to stand    Time  8    Period  Weeks    Status  New    Target Date  11/30/18      PT LONG TERM GOAL #3   Title  Patient will increase lumbar spine motion by 50% in order to complete ADLs     Baseline  Lumbar rotaiton (Bil) limited by 75% , lumbar flexion 10 deg, lumbar ext 5 deg     Time  8    Period  Weeks    Status  New    Target Date  11/30/18            Plan - 11/03/18 1250    Clinical Impression Statement  Patient met STG#1 for hip strength  RT 4/5 LT 4-/5 flexion.  Exercise today helped decrease pain and stiffness.  Trunk ROM improving  .  She is able to tolerate a 30 minute exercise video at home low impact and knee  friendly.      PT Next Visit Plan   continue ball work either sitting or supine.  work toward goals.    PT Home Exercise Plan  Ab sets, ab sets with clam shells, Hamstring stretch seated, piriformis stretch seated  hamstring curls.    Consulted and Agree with Plan of Care  Patient       Patient will benefit from skilled therapeutic intervention in order to improve the following deficits and impairments:     Visit Diagnosis: Chronic bilateral low back pain with bilateral sciatica  Muscle weakness (generalized)  Difficulty in walking, not elsewhere classified  Unsteadiness on feet    PHYSICAL THERAPY DISCHARGE SUMMARY  Visits from Start of Care: 5 Current functional level related to goals / functional outcomes: Patient did not return     Remaining deficits: Did not return    Education / Equipment: Did not return   Plan: Patient agrees to discharge.  Patient goals were not met. Patient is being discharged due to not returning since the last visit.  ?????      Problem List Patient Active Problem List   Diagnosis Date Noted  . Notalgia 02/11/2018  . Anemia 02/11/2018  . Chronic right shoulder pain 01/21/2018  . Chronic bilateral low back pain with left-sided sciatica 12/14/2017  . History of stroke 08/13/2017  . Irregular menstrual cycle 08/13/2017  . Iron deficiency anemia 12/12/2016  . Seizure ([place])   . ICH (intracerebral hemorrhage) ([place]) 11/08/2016  . COPD exacerbation ([place]) 05/10/2016  . Acute bronchitis 04/03/2016  . History of alcohol dependence ([place]) 01/11/2016  . URI (upper respiratory infection) 02/24/2014  . Left leg DVT ([place]) 09/29/2013  . Pre-diabetes 03/02/2013  . History of pulmonary embolism 05/05/2012  . Painful menstrual periods 01/05/2012  . Bipolar disorder ([place]) 07/03/2010  . History of tobacco abuse 08/23/2009  . Hyperlipidemia 04/30/2007  . Class 3 severe obesity due to excess calories with serious comorbidity and body mass index (BMI) of  50.0 to 59.9 in adult ([place]) 01/01/2007  . HYPERTENSION, BENIGN SYSTEMIC 01/01/2007    ,  PTA 11/03/2018, 12:54 PM  [facility] [address] [place], [place], [address] Phone: [phone]   Fax:  [phone]  Name: JAKIA KENNEBREW MRN: [id number] Date of Birth: Jan 24, 1966

## 2018-11-05 ENCOUNTER — Ambulatory Visit: Payer: Medicaid Other | Attending: Family Medicine | Admitting: Physical Therapy

## 2018-11-12 ENCOUNTER — Ambulatory Visit: Payer: Self-pay | Admitting: Family Medicine

## 2018-11-25 ENCOUNTER — Other Ambulatory Visit: Payer: Self-pay

## 2018-11-25 DIAGNOSIS — R062 Wheezing: Secondary | ICD-10-CM

## 2018-11-25 MED ORDER — ALBUTEROL SULFATE HFA 108 (90 BASE) MCG/ACT IN AERS: 1.0000 | INHALATION_SPRAY | Freq: Four times a day (QID) | RESPIRATORY_TRACT | 5 refills | Status: DC | PRN

## 2018-12-01 ENCOUNTER — Ambulatory Visit (INDEPENDENT_AMBULATORY_CARE_PROVIDER_SITE_OTHER): Payer: Medicaid Other | Admitting: Family Medicine

## 2018-12-01 ENCOUNTER — Ambulatory Visit (HOSPITAL_COMMUNITY)
Admission: RE | Admit: 2018-12-01 | Discharge: 2018-12-01 | Disposition: A | Discharge status: Home / Self Care | Payer: Medicaid Other | Source: Ambulatory Visit | Attending: Family Medicine | Admitting: Family Medicine

## 2018-12-01 ENCOUNTER — Telehealth: Payer: Self-pay | Admitting: Family Medicine

## 2018-12-01 ENCOUNTER — Encounter: Payer: Self-pay | Admitting: Family Medicine

## 2018-12-01 ENCOUNTER — Other Ambulatory Visit: Payer: Self-pay

## 2018-12-01 VITALS — BP 144/72 | HR 95 | Temp 101.1°F | Wt 299.0 lb

## 2018-12-01 DIAGNOSIS — R509 Fever, unspecified: Secondary | ICD-10-CM | POA: Diagnosis not present

## 2018-12-01 DIAGNOSIS — R042 Hemoptysis: Secondary | ICD-10-CM | POA: Insufficient documentation

## 2018-12-01 DIAGNOSIS — R05 Cough: Secondary | ICD-10-CM | POA: Insufficient documentation

## 2018-12-01 DIAGNOSIS — R059 Cough, unspecified: Secondary | ICD-10-CM

## 2018-12-01 DIAGNOSIS — J101 Influenza due to other identified influenza virus with other respiratory manifestations: Secondary | ICD-10-CM

## 2018-12-01 HISTORY — DX: Influenza due to other identified influenza virus with other respiratory manifestations: J10.1

## 2018-12-01 LAB — POCT INFLUENZA A/B
Influenza A, POC: POSITIVE — AB
Influenza B, POC: NEGATIVE

## 2018-12-01 MED ORDER — DOXYCYCLINE HYCLATE 100 MG PO TABS: 100.0000 mg | ORAL_TABLET | Freq: Two times a day (BID) | ORAL | 0 refills | Status: AC

## 2018-12-01 MED ORDER — OSELTAMIVIR PHOSPHATE 75 MG PO CAPS: 75.0000 mg | ORAL_CAPSULE | Freq: Two times a day (BID) | ORAL | 0 refills | Status: AC

## 2018-12-01 NOTE — Patient Instructions (Addendum)
It was nice seeing you today. You do have flu infection. Please use Tamiflu as prescribed for 5 days. Use Tylenol as needed for fever and pain. Get xray done as instructed because of blood in your sputum. If you continue to have worsening cough despite treatment, please go to the ED.   Influenza, Adult Influenza is also called "the flu." It is an infection in the lungs, nose, and throat (respiratory tract). It is caused by a virus. The flu causes symptoms that are similar to symptoms of a cold. It also causes a high fever and body aches. The flu spreads easily from person to person (is contagious). Getting a flu shot (influenza vaccination) every year is the best way to prevent the flu. What are the causes? This condition is caused by the influenza virus. You can get the virus by:  Breathing in droplets that are in the air from the cough or sneeze of a person who has the virus.  Touching something that has the virus on it (is contaminated) and then touching your mouth, nose, or eyes. What increases the risk? Certain things may make you more likely to get the flu. These include:  Not washing your hands often.  Having close contact with many people during cold and flu season.  Touching your mouth, eyes, or nose without first washing your hands.  Not getting a flu shot every year. You may have a higher risk for the flu, along with serious problems such as a lung infection (pneumonia), if you:  Are older than 65.  Are pregnant.  Have a weakened disease-fighting system (immune system) because of a disease or taking certain medicines.  Have a long-term (chronic) illness, such as: ? Heart, kidney, or lung disease. ? Diabetes. ? Asthma.  Have a liver disorder.  Are very overweight (morbidly obese).  Have anemia. This is a condition that affects your red blood cells. What are the signs or symptoms? Symptoms usually begin suddenly and last 4-14 days. They may include:  Fever and  chills.  Headaches, body aches, or muscle aches.  Sore throat.  Cough.  Runny or stuffy (congested) nose.  Chest discomfort.  Not wanting to eat as much as normal (poor appetite).  Weakness or feeling tired (fatigue).  Dizziness.  Feeling sick to your stomach (nauseous) or throwing up (vomiting). How is this treated? If the flu is found early, you can be treated with medicine that can help reduce how bad the illness is and how long it lasts (antiviral medicine). This may be given by mouth (orally) or through an IV tube. Taking care of yourself at home can help your symptoms get better. Your doctor may suggest:  Taking over-the-counter medicines.  Drinking plenty of fluids. The flu often goes away on its own. If you have very bad symptoms or other problems, you may be treated in a hospital. Follow these instructions at home:     Activity  Rest as needed. Get plenty of sleep.  Stay home from work or school as told by your doctor. ? Do not leave home until you do not have a fever for 24 hours without taking medicine. ? Leave home only to visit your doctor. Eating and drinking  Take an ORS (oral rehydration solution). This is a drink that is sold at pharmacies and stores.  Drink enough fluid to keep your pee (urine) pale yellow.  Drink clear fluids in small amounts as you are able. Clear fluids include: ? Water. ? Ice chips. ?  Fruit juice that has water added (diluted fruit juice). ? Low-calorie sports drinks.  Eat bland, easy-to-digest foods in small amounts as you are able. These foods include: ? Bananas. ? Applesauce. ? Rice. ? Lean meats. ? Toast. ? Crackers.  Do not eat or drink: ? Fluids that have a lot of sugar or caffeine. ? Alcohol. ? Spicy or fatty foods. General instructions  Take over-the-counter and prescription medicines only as told by your doctor.  Use a cool mist humidifier to add moisture to the air in your home. This can make it easier  for you to breathe.  Cover your mouth and nose when you cough or sneeze.  Wash your hands with soap and water often, especially after you cough or sneeze. If you cannot use soap and water, use alcohol-based hand sanitizer.  Keep all follow-up visits as told by your doctor. This is important. How is this prevented?   Get a flu shot every year. You may get the flu shot in late summer, fall, or winter. Ask your doctor when you should get your flu shot.  Avoid contact with people who are sick during fall and winter (cold and flu season). Contact a doctor if:  You get new symptoms.  You have: ? Chest pain. ? Watery poop (diarrhea). ? A fever.  Your cough gets worse.  You start to have more mucus.  You feel sick to your stomach.  You throw up. Get help right away if you:  Have shortness of breath.  Have trouble breathing.  Have skin or nails that turn a bluish color.  Have very bad pain or stiffness in your neck.  Get a sudden headache.  Get sudden pain in your face or ear.  Cannot eat or drink without throwing up. Summary  Influenza ("the flu") is an infection in the lungs, nose, and throat. It is caused by a virus.  Take over-the-counter and prescription medicines only as told by your doctor.  Getting a flu shot every year is the best way to avoid getting the flu. This information is not intended to replace advice given to you by your health care provider. Make sure you discuss any questions you have with your health care provider. Document Released: 07/30/2008 Document Revised: 04/08/2018 Document Reviewed: 04/08/2018 Elsevier Interactive Patient Education  2019 [employer].

## 2018-12-01 NOTE — Assessment & Plan Note (Signed)
Presenting with cough and fever. She is well appearing and clinically stable. Given hx of blood tint sputum, I will obtain xray and call her with the result. ED precaution discussed. Keep well hydrated and rest at home. Tamiflu prescribed. Tylenol as needed for fever.

## 2018-12-01 NOTE — Progress Notes (Signed)
poct

## 2018-12-01 NOTE — Telephone Encounter (Signed)
Xray report discussed with her. O2 Sat during visit was normal. She is hemodynamically stable. She will benefit from outpatient management of her pneumonia and influenza.  I gave admission for observation option as well but she declined.  Start Doxyc for CAP in addition to Tamiflu.  Strict ED precaution discussed.  F/U in 1 week with PCP for reassessment.   Doxy escribed.

## 2018-12-01 NOTE — Progress Notes (Signed)
Subjective:     Patient ID: Carla Hanson, female   DOB: 28-Aug-1966, 53 y.o.   MRN: [id number]  Fever   This is a new problem. The current episode started in the past 7 days (started about 2 days ago). The problem occurs intermittently. The problem has been unchanged. Her temperature was unmeasured (She felt warm at home) prior to arrival. Associated symptoms include chest pain, coughing, headaches, muscle aches and nausea. Pertinent negatives include no abdominal pain, congestion, sore throat or vomiting. Associated symptoms comments: Loose bowel, feels weak, muscle ache. Treatments tried: Mucinex for cough. The treatment provided mild relief.  Risk factors: no recent sickness and no sick contacts   Cough  This is a new problem. The current episode started in the past 7 days. The problem has been unchanged. The problem occurs every few minutes. The cough is productive of blood-tinged sputum. Associated symptoms include chest pain, a fever, headaches, hemoptysis and shortness of breath. Pertinent negatives include no sore throat. Associated symptoms comments: Used albuterol today and she felt better with her breathing. Nothing aggravates the symptoms. She has tried a beta-agonist inhaler for the symptoms. The treatment provided significant relief. Her past medical history is significant for COPD.   Current Outpatient Medications on File Prior to Visit  Medication Sig Dispense Refill  . aspirin EC 81 MG EC tablet Take 1 tablet (81 mg total) by mouth daily. 30 tablet 12  . ferrous sulfate 325 (65 FE) MG tablet Take 1 tablet (325 mg total) by mouth 2 (two) times daily with a meal. 60 tablet 2  . losartan-hydrochlorothiazide (HYZAAR) 50-12.5 MG tablet Take 1 tablet by mouth daily. 90 tablet 2  . acetaminophen (TYLENOL) 325 MG tablet Take 650 mg by mouth every 6 (six) hours as needed for mild pain.    Marland Kitchen albuterol (PROVENTIL HFA;VENTOLIN HFA) 108 (90 Base) MCG/ACT inhaler Inhale 1 puff into the lungs  every 6 (six) hours as needed for wheezing or shortness of breath. 8 g 5  . albuterol (PROVENTIL) (2.5 MG/3ML) 0.083% nebulizer solution Take 3 mLs (2.5 mg total) by nebulization every 6 (six) hours as needed for wheezing or shortness of breath. 75 mL 12  . azithromycin (ZITHROMAX) 250 MG tablet Take 2 tablets on day 1, and 1 tablet each day after 6 tablet 0  . predniSONE (DELTASONE) 20 MG tablet Take 2 tablets (20mg  each) with breakfast each day for 5 days (Patient not taking: Reported on 12/01/2018) 10 tablet 0   No current facility-administered medications on file prior to visit.    Past Medical History:  Diagnosis Date  . Anxiety   . Bipolar disorder ([place])   . Hyperlipidemia   . Hypertension   . Lipoma    Abdomen  . LIPOMA 01/20/2008  . Obesity   . Pneumonia   . Tobacco abuse    Vitals:   12/01/18 0926  BP: (!) 144/72  Pulse: 95  Temp: (!) 101.1 F (38.4 C)  TempSrc: Oral  SpO2: 97%  Weight: 299 lb (135.6 kg)     Review of Systems  Constitutional: Positive for fever.  HENT: Negative for congestion and sore throat.   Respiratory: Positive for cough, hemoptysis and shortness of breath.   Cardiovascular: Positive for chest pain.  Gastrointestinal: Positive for nausea. Negative for abdominal pain and vomiting.  Neurological: Positive for headaches.  All other systems reviewed and are negative.      Objective:   Physical Exam Vitals signs and nursing note reviewed.  Constitutional:      General: She is not in acute distress.    Appearance: She is not ill-appearing or toxic-appearing.  HENT:     Right Ear: Tympanic membrane and ear canal normal.     Left Ear: Tympanic membrane and ear canal normal.     Mouth/Throat:     Pharynx: No oropharyngeal exudate or posterior oropharyngeal erythema.  Eyes:     Conjunctiva/sclera: Conjunctivae normal.     Pupils: Pupils are equal, round, and reactive to light.  Neck:     Musculoskeletal: Normal range of motion. No neck  rigidity.  Cardiovascular:     Rate and Rhythm: Normal rate and regular rhythm.     Pulses: Normal pulses.     Heart sounds: Normal heart sounds. No murmur.  Pulmonary:     Effort: Pulmonary effort is normal. No respiratory distress.     Breath sounds: Normal breath sounds. No stridor. No wheezing, rhonchi or rales.  Abdominal:     General: Bowel sounds are normal. There is no distension.     Palpations: Abdomen is soft. There is no mass.     Tenderness: There is no abdominal tenderness.     Hernia: No hernia is present.  Neurological:     General: No focal deficit present.     Mental Status: She is alert and oriented to person, place, and time. Mental status is at baseline.        Assessment:     Fever Cough Hemoptysis    Plan:     Check problem list.

## 2018-12-10 ENCOUNTER — Telehealth: Payer: Self-pay

## 2018-12-10 NOTE — Telephone Encounter (Signed)
Call received from pharmacy. Please send as two separate prescriptions.  Danley Danker, RN [facility] RN)

## 2018-12-10 NOTE — Telephone Encounter (Signed)
Patient needs alternative sent to pharmacy for Losartan/HCTZ due to national backorder on Losartan.  Call back is [phone]  Danley Danker, RN [facility])

## 2018-12-11 ENCOUNTER — Other Ambulatory Visit: Payer: Self-pay | Admitting: Family Medicine

## 2018-12-11 ENCOUNTER — Ambulatory Visit (INDEPENDENT_AMBULATORY_CARE_PROVIDER_SITE_OTHER): Payer: Medicaid Other | Admitting: Family Medicine

## 2018-12-11 ENCOUNTER — Telehealth: Payer: Self-pay | Admitting: *Deleted

## 2018-12-11 ENCOUNTER — Encounter: Payer: Self-pay | Admitting: Family Medicine

## 2018-12-11 DIAGNOSIS — J449 Chronic obstructive pulmonary disease, unspecified: Secondary | ICD-10-CM | POA: Diagnosis not present

## 2018-12-11 MED ORDER — TELMISARTAN 40 MG PO TABS: 40.0000 mg | ORAL_TABLET | Freq: Every day | ORAL | 2 refills | Status: DC

## 2018-12-11 MED ORDER — MOMETASONE FURO-FORMOTEROL FUM 100-5 MCG/ACT IN AERO: 2.0000 | INHALATION_SPRAY | Freq: Two times a day (BID) | RESPIRATORY_TRACT | 0 refills | Status: DC

## 2018-12-11 MED ORDER — HYDROCHLOROTHIAZIDE 12.5 MG PO CAPS: 12.5000 mg | ORAL_CAPSULE | Freq: Every day | ORAL | 2 refills | Status: DC

## 2018-12-11 MED ORDER — VALSARTAN 40 MG PO TABS: 40.0000 mg | ORAL_TABLET | Freq: Every day | ORAL | 0 refills | Status: DC

## 2018-12-11 MED ORDER — LOSARTAN POTASSIUM 50 MG PO TABS: 50.0000 mg | ORAL_TABLET | Freq: Every day | ORAL | 2 refills | Status: DC

## 2018-12-11 NOTE — Patient Instructions (Signed)
Please schedule PFT appointment with Dr. Valentina Lucks to reassess your COPD. Start Dulera inhlaer in addition to your albuterol. See PCP in 2 weeks.    Pulmonary Function Tests Pulmonary function tests (PFTs) are used to measure how well your lungs work, find out what is causing your lung problems, and figure out the best treatment for you. You may have PFTs:  When you have an illness involving the lungs.  To follow changes in your lung function over time if you have a chronic lung disease.  If you are an Nature conservation officer. This checks the effects of being exposed to chemicals over a long period of time.  To check lung function before having surgery or other procedures.  To check your lungs if you smoke.  To check if prescribed medicines or treatments are helping your lungs. Your results will be compared to the expected lung function of someone with healthy lungs who is similar to you in:  Age.  Gender.  Height.  Weight.  Race or ethnicity. This is done to show how your lungs compare to normal lung function (percent predicted). This is how your health care provider knows if your lung function is normal or not. If you have had PFTs done before, your health care provider will compare your current results with past results. This shows if your lung function is better, worse, or the same as before. Tell a health care provider about:  Any allergies you have.  All medicines you are taking, including inhaler or nebulizer medicines, vitamins, herbs, eye drops, creams, and over-the-counter medicines.  Any blood disorders you have.  Any surgeries you have had, especially recent eye surgery, abdominal surgery, or chest surgery. These can make PFTs difficult or unsafe.  Any medical conditions you have, including chest pain or heart problems, tuberculosis, or respiratory infections such as pneumonia, a cold, or the flu.  Any fear of being in closed spaces (claustrophobia). Some of your  tests may be in a closed space. What are the risks? Generally, this is a safe procedure. However, problems may occur, including:  Light-headedness due to over-breathing (hyperventilation).  An asthma attack from deep breathing.  A collapsed lung. What happens before the procedure?  Take over-the-counter and prescription medicines only as told by your health care provider. If you take inhaler or nebulizer medicines, ask your health care provider which medicines you should take on the day of your testing. Some inhaler medicines may interfere with PFTs if they are taken shortly before the tests.  Follow your health care provider's instructions on eating and drinking restrictions. This may include avoiding eating large meals and drinking alcohol before the testing.  Do not use any products that contain nicotine or tobacco, such as cigarettes and e-cigarettes. If you need help quitting, ask your health care provider.  Wear comfortable clothing that will not interfere with breathing. What happens during the procedure?   You will be given a soft nose clip to wear. This is done so all of your breaths will go through your mouth instead of your nose.  You will be given a germ-free (sterile) mouthpiece. It will be attached to a machine that measures your breathing (spirometer).  You will be asked to do various breathing maneuvers. The maneuvers will be done by breathing in (inhaling) and breathing out (exhaling). You may be asked to repeat the maneuvers several times before the testing is done.  It is important to follow the instructions exactly to get accurate results. Make sure  to blow as hard and as fast as you can when you are told to do so.  You may be given a medicine that makes the small air passages in your lungs larger (bronchodilator) after testing has been done. This medicine will make it easier for you to breathe.  The tests will be repeated after the bronchodilator has taken  effect.  You will be monitored carefully during the procedure for faintness, dizziness, trouble breathing, or any other problems. The procedure may vary among health care providers and hospitals. What happens after the procedure?  It is up to you to get your test results. Ask your health care provider, or the department that is doing the tests, when your results will be ready. After you have received your test results, talk with your health care provider about treatment options, if necessary. Summary  Pulmonary function tests (PFTs) are used to measure how well your lungs work, find out what is causing your lung problems, and figure out the best treatment for you.  Wear comfortable clothing that will not interfere with breathing.  It is up to you to get your test results. After you have received them, talk with your health care provider about treatment options, if necessary. This information is not intended to replace advice given to you by your health care provider. Make sure you discuss any questions you have with your health care provider. Document Released: 06/13/2004 Document Revised: 09/12/2016 Document Reviewed: 09/12/2016 Elsevier Interactive Patient Education  2019 [employer].

## 2018-12-11 NOTE — Telephone Encounter (Signed)
  Checked PDL, valsartan sent to pharmacy instead. Patient still needs to come in for 2 week nurse BP check  Lucila Maine, DO PGY-3, [facility] 12/11/2018 1:55 PM

## 2018-12-11 NOTE — Telephone Encounter (Signed)
Could you send that to her PCP please? Thanks.

## 2018-12-11 NOTE — Telephone Encounter (Signed)
Request received from pharmacy to send as two separate prescriptions, one for Losartan 50 and one for HCTZ 12.5 mg.  They can provide this to patient if sent for separate components.  Danley Danker, RN [facility] RN)

## 2018-12-11 NOTE — Telephone Encounter (Signed)
That I had sent in separately

## 2018-12-11 NOTE — Telephone Encounter (Signed)
Oh. I misread that. Thought they were out of losartan completely. OK, will send in losartan separately, ignore the follow up and other prescriptions

## 2018-12-11 NOTE — Assessment & Plan Note (Signed)
She completed treated for influenza and bacterial PNA. Pulm exam stable. Saturating well on room air and she is not in distress. She might be dealing with residual COPD exacerbation from her last visit secondary to PNA and influenza. Upon reviewing her record, she had PFT done 2 yrs ago which shows moderately severe obstruction. [name] was started by Dr. Valentina Lucks then. I will restart Dulera. Continue Albuterol prn. F/U with D. Koval for repeat PFT. May increase Dulera dose or d/c it per PFT report. Return precaution discussed. She verbalized understanding.

## 2018-12-11 NOTE — Telephone Encounter (Signed)
Send in HCTZ too please.  Danley Danker, RN [facility] RN)

## 2018-12-11 NOTE — Progress Notes (Signed)
  Subjective:     Patient ID: Carla Hanson, female   DOB: 13-Jun-1966, 53 y.o.   MRN: [id number]  HPI COPD/PNA/Influenza: Taking her breathing treatment more often than usual. Former smoker, quit 2 yrs ago. Still coughing up phlegm mostly in the morning. Occasional SOB and wheezing. She had needed to use her albuterol twice a day which is not her usual. This helps a lot. Denies fever. She had been on Dulera in the past but had not been refilled lately.  Current Outpatient Medications on File Prior to Visit  Medication Sig Dispense Refill  . acetaminophen (TYLENOL) 325 MG tablet Take 650 mg by mouth every 6 (six) hours as needed for mild pain.    Marland Kitchen albuterol (PROVENTIL HFA;VENTOLIN HFA) 108 (90 Base) MCG/ACT inhaler Inhale 1 puff into the lungs every 6 (six) hours as needed for wheezing or shortness of breath. 8 g 5  . albuterol (PROVENTIL) (2.5 MG/3ML) 0.083% nebulizer solution Take 3 mLs (2.5 mg total) by nebulization every 6 (six) hours as needed for wheezing or shortness of breath. 75 mL 12  . aspirin EC 81 MG EC tablet Take 1 tablet (81 mg total) by mouth daily. 30 tablet 12  . ferrous sulfate 325 (65 FE) MG tablet Take 1 tablet (325 mg total) by mouth 2 (two) times daily with a meal. 60 tablet 2  . losartan-hydrochlorothiazide (HYZAAR) 50-12.5 MG tablet Take 1 tablet by mouth daily. 90 tablet 2  . predniSONE (DELTASONE) 20 MG tablet Take 2 tablets (20mg  each) with breakfast each day for 5 days (Patient not taking: Reported on 12/01/2018) 10 tablet 0   No current facility-administered medications on file prior to visit.    Past Medical History:  Diagnosis Date  . Anxiety   . Bipolar disorder ([place])   . Hyperlipidemia   . Hypertension   . Lipoma    Abdomen  . LIPOMA 01/20/2008  . Obesity   . Pneumonia   . Tobacco abuse    Vitals:   12/11/18 0912 12/11/18 0929  BP: 134/72   Pulse: 76   Temp: 98.5 F (36.9 C)   TempSrc: Oral   SpO2: 98%   Weight: (!) 306 lb (138.8 kg)   PF: 220  L/min 200 L/min   FEV1/FVC ratio: 79%  Review of Systems  Respiratory: Positive for cough, shortness of breath and wheezing.   Cardiovascular: Negative.   Gastrointestinal: Negative.   Genitourinary: Negative.   All other systems reviewed and are negative.      Objective:   Physical Exam Vitals signs and nursing note reviewed.  Constitutional:      Appearance: She is well-developed.  Cardiovascular:     Rate and Rhythm: Normal rate and regular rhythm.     Pulses: Normal pulses.     Heart sounds: Normal heart sounds. No murmur.  Pulmonary:     Effort: Pulmonary effort is normal. No tachypnea.     Breath sounds: Normal breath sounds. No decreased breath sounds, wheezing or rhonchi.  Abdominal:     General: Bowel sounds are normal. There is no distension.     Palpations: Abdomen is soft. There is no mass.     Tenderness: There is no abdominal tenderness.  Neurological:     Mental Status: She is alert.        Assessment:     COPD PNA and influenza F/U: Resolved.    Plan:     Check problem list.

## 2018-12-11 NOTE — Telephone Encounter (Signed)
I apologize, this was sent in place of losartan/HCTZ.  I saw that she had an appt today and assumed it was started @ that appt.   To PCP to address PA. Athan Casalino, Salome Spotted, CMA

## 2018-12-11 NOTE — Addendum Note (Signed)
Addended by: Lucila Maine C on: 12/11/2018 02:00 PM   Modules accepted: Orders

## 2018-12-11 NOTE — Telephone Encounter (Signed)
Received fax from [place] requesting prior authorization of telmisartan 40mg  .  Form placed in MD's box for completion along with Medicaid formulary.  Zaidee Rion, Salome Spotted, CMA

## 2018-12-11 NOTE — Telephone Encounter (Signed)
OK...... I contacted pharmacy they actually had one months worth of the losartan/hctz combo medication.    They will provide this month of combo but will keep the separate medications on file for the next month if the combo is not available.   I have canceled the micardis and valsartan script @ pharmacy.  Dr. Vanetta Shawl, We do not need to set her up with a BP check now correct since she is continuing same medication?  [place], Carla Hanson, CMA

## 2018-12-11 NOTE — Telephone Encounter (Signed)
  Sent in new rx, please have patient come in for nurse check in about 2 weeks to check BP as the conversion dose is not 1:1, want to make sure I don't need to change dose.  Thanks!

## 2018-12-14 NOTE — Telephone Encounter (Signed)
Correct no BP check needed thanks!

## 2018-12-21 ENCOUNTER — Ambulatory Visit: Payer: Self-pay | Admitting: Family Medicine

## 2018-12-24 ENCOUNTER — Ambulatory Visit: Payer: Medicaid Other | Admitting: Pharmacist

## 2018-12-25 ENCOUNTER — Ambulatory Visit: Payer: Medicaid Other | Admitting: Family Medicine

## 2018-12-28 ENCOUNTER — Ambulatory Visit: Payer: Self-pay | Admitting: Family Medicine

## 2019-01-04 ENCOUNTER — Encounter: Payer: Self-pay | Admitting: *Deleted

## 2019-01-12 ENCOUNTER — Encounter: Payer: Self-pay | Admitting: Physical Therapy

## 2019-01-15 ENCOUNTER — Ambulatory Visit: Payer: Medicaid Other | Admitting: Pharmacist

## 2019-01-15 ENCOUNTER — Ambulatory Visit: Payer: Medicaid Other | Admitting: Family Medicine

## 2019-01-18 ENCOUNTER — Ambulatory Visit: Payer: Medicaid Other | Admitting: Family Medicine

## 2019-02-01 ENCOUNTER — Other Ambulatory Visit: Payer: Self-pay | Admitting: Internal Medicine

## 2019-02-01 DIAGNOSIS — R062 Wheezing: Secondary | ICD-10-CM

## 2019-02-03 ENCOUNTER — Encounter: Payer: Self-pay | Admitting: Oncology

## 2019-02-03 ENCOUNTER — Telehealth: Payer: Self-pay | Admitting: Oncology

## 2019-02-03 NOTE — Telephone Encounter (Signed)
A new hem appt has been scheduled for the pt to see Dr. Alen Blew on 4/30 at 11am. Letter mailed.

## 2019-02-22 ENCOUNTER — Telehealth: Payer: Self-pay

## 2019-02-22 ENCOUNTER — Telehealth (INDEPENDENT_AMBULATORY_CARE_PROVIDER_SITE_OTHER): Payer: Medicaid Other | Admitting: Family Medicine

## 2019-02-22 ENCOUNTER — Other Ambulatory Visit: Payer: Self-pay

## 2019-02-22 DIAGNOSIS — M25562 Pain in left knee: Secondary | ICD-10-CM

## 2019-02-22 DIAGNOSIS — I10 Essential (primary) hypertension: Secondary | ICD-10-CM

## 2019-02-22 DIAGNOSIS — J449 Chronic obstructive pulmonary disease, unspecified: Secondary | ICD-10-CM

## 2019-02-22 DIAGNOSIS — D509 Iron deficiency anemia, unspecified: Secondary | ICD-10-CM

## 2019-02-22 DIAGNOSIS — M1712 Unilateral primary osteoarthritis, left knee: Secondary | ICD-10-CM

## 2019-02-22 MED ORDER — ALBUTEROL SULFATE HFA 108 (90 BASE) MCG/ACT IN AERS: 2.0000 | INHALATION_SPRAY | Freq: Four times a day (QID) | RESPIRATORY_TRACT | 2 refills | Status: DC | PRN

## 2019-02-22 MED ORDER — HYDROCHLOROTHIAZIDE 12.5 MG PO CAPS: 12.5000 mg | ORAL_CAPSULE | Freq: Every day | ORAL | 2 refills | Status: DC

## 2019-02-22 MED ORDER — MOMETASONE FURO-FORMOTEROL FUM 100-5 MCG/ACT IN AERO: 2.0000 | INHALATION_SPRAY | Freq: Two times a day (BID) | RESPIRATORY_TRACT | 1 refills | Status: DC

## 2019-02-22 MED ORDER — LOSARTAN POTASSIUM 50 MG PO TABS: 50.0000 mg | ORAL_TABLET | Freq: Every day | ORAL | 2 refills | Status: DC

## 2019-02-22 MED ORDER — DICLOFENAC SODIUM 1 % TD GEL: 4.0000 g | Freq: Four times a day (QID) | TRANSDERMAL | 3 refills | Status: DC

## 2019-02-22 NOTE — Telephone Encounter (Signed)
Pt called nurse line requesting a DME order to be put in by PCP for a "walking cane." Pt stated she is having continued knee pain, pt is also requesting a prescription for pain medication. I scheduled her for a telehealth visit for this am.

## 2019-02-22 NOTE — Progress Notes (Signed)
[place] Telemedicine Visit  Patient consented to have virtual visit. Method of visit: Video which did not work due to patient's home wifi signal being low   Encounter participants: Patient: Carla Hanson - located at home  Provider: Martyn Malay - located at [facility]   Chief Complaint: spasms   HPI:  Carla Hanson is a 53 year old with history of pulmonary embolism, obesity, and HTN presenting via virtual visit for left knee pain.  Video visit was recommended which was attempted.  This was cut short as the patient's home Wi-Fi was not working well.  The patient reports a 3 week history of left knee pain. This has occurred several times before. She has been doing many more exercises at home due to the stay at home order. She has tried Tylenol and stretches. Pain is sometimes worse at night, always worse when she twists. She is adamant she needs a cane to get to her mailbox. Pain is anteriorly located in knee just behind the patella and medially. No posterior knee swelling.  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 Specifically denies knee swelling, redness, fevers, calf swelling, or leg swelling. Endorses some clicking but not catching, popping, or locking. No falls, weakness, numbness, hip or back pain.   ROS: per HPI  Pertinent PMHx:  Obesity PE- both provoked and unprovoked DVT, seen by Hematology who recommended ASA  HTN  Anemia  Exam:  Respiratory: Speaking in full sentences, no increased work of breathing   Assessment/Plan:  Diagnoses and all orders for this visit:  Microcytic anemia, recommended colonoscopy (screening Cologuard is suboptimal given signs of anemia and history of VTE, discussed with patient, she is certain she only wants Cologuard). Also recommend consideration of evaluation of heavy menses given age.   Acute pain of left knee, likely due to   Primary osteoarthritis of left knee, possibly meniscal issue given clicking and medially located pain. Discussed limitations in examination given above information and lack of exam. Discussed signs and symptoms of DVT, patient aware of symptoms and will call if she develops posterior knee pain, calf pain, or calf swelling.  -     diclofenac sodium (VOLTAREN) 1 % GEL; Apply 4 g topically 4 (four) times daily. -     For home use only DME Cane  COPD, moderate (HCC) -     albuterol (PROVENTIL HFA) 108 (90 Base) MCG/ACT inhaler; Inhale 2 puffs into the lungs every 6 (six) hours as needed for wheezing or shortness of breath. -     mometasone-formoterol (DULERA) 100-5 MCG/ACT AERO; Inhale 2 puffs into the lungs 2 times daily at 12 noon and 4 pm.  HTN- refilled, last BMP WNL.  -     losartan (COZAAR) 50 MG tablet; Take 1 tablet (50 mg total) by mouth daily. -     hydrochlorothiazide (MICROZIDE) 12.5 MG capsule; Take 1 capsule (12.5 mg total) by mouth daily.  Time spent during visit with patient: 15 minutes

## 2019-02-23 ENCOUNTER — Telehealth: Payer: Self-pay | Admitting: *Deleted

## 2019-02-23 NOTE — Telephone Encounter (Signed)
Prior approval for diclofenac gel completed via [employer].  Med approved for 02/23/2019 - 03/25/2019. Prior approval # [id number].  [place] pharmacy informed.  Christen Bame, CMA

## 2019-03-04 ENCOUNTER — Encounter: Payer: Self-pay | Admitting: Family Medicine

## 2019-03-04 ENCOUNTER — Inpatient Hospital Stay: Payer: Medicaid Other | Attending: Oncology | Admitting: Oncology

## 2019-03-04 ENCOUNTER — Other Ambulatory Visit: Payer: Self-pay

## 2019-03-04 ENCOUNTER — Telehealth (INDEPENDENT_AMBULATORY_CARE_PROVIDER_SITE_OTHER): Payer: Medicaid Other | Admitting: Family Medicine

## 2019-03-04 VITALS — BP 126/79

## 2019-03-04 DIAGNOSIS — F411 Generalized anxiety disorder: Secondary | ICD-10-CM

## 2019-03-04 NOTE — Progress Notes (Signed)
[place] Telemedicine Visit  Patient consented to have virtual visit. Method of visit: Telephone  Encounter participants: Patient: Carla Hanson - located at Home Provider: Andrena Mews - located at Office Others (if applicable): NA  Chief Complaint: Palpitation and anxiety  HPI: Anxiety  Presents for follow-up visit. Symptoms include nervous/anxious behavior and palpitations. Patient reports no chest pain, confusion, decreased concentration, depressed mood, dizziness, excessive worry, feeling of choking, hyperventilation, insomnia or suicidal ideas. Primary symptoms comment: started 5 days ago, she started feeling better 2 days ago.. Symptoms occur most days. The most recent episode lasted 4 days. The severity of symptoms is moderate. The quality of sleep is good. Nighttime awakenings: none.   Treatment side effects: Denies hallucination.  Patient stated that her symptoms improved 2 days ago and completely resolved yesterday.    ROS: per HPI  Pertinent PMHx: Problem list reviewed  Exam:  Respiratory: No distress GAD 7: score of 2 (Becoming easily annoyed or irritable) PHQ9: Score of 1 (Feeling tired or having little energy?)  Assessment/Plan:  Anxiety state: Feels much better. Likely related to the current situation with COVID-19 Patient stated that she was able to get out and walk around two days ago which made her feel better. She is currently asymptomatic. I recommended [facility] counseling but she declined. We will monitor closely. Return precaution discussed.  Time spent during visit with patient: 16 minutes

## 2019-03-04 NOTE — Addendum Note (Signed)
Addended by: Hulan Fray on: 03/04/2019 06:49 PM   Modules accepted: Orders

## 2019-03-09 NOTE — Telephone Encounter (Signed)
Community message sent to [employer] and Darlina Guys @ [facility] to process DME order for walking cane.   Christen Bame, CMA

## 2019-03-09 NOTE — Telephone Encounter (Signed)
Carla Hanson has received order and will process now. Christen Bame, CMA

## 2019-04-12 ENCOUNTER — Telehealth: Payer: Self-pay

## 2019-04-12 MED ORDER — ALBUTEROL SULFATE HFA 108 (90 BASE) MCG/ACT IN AERS: 2.0000 | INHALATION_SPRAY | RESPIRATORY_TRACT | 2 refills | Status: DC | PRN

## 2019-04-12 NOTE — Telephone Encounter (Signed)
proair sent to preferred pharmacy  Lucila Maine, DO PGY-3, [facility] 04/12/2019 8:02 PM

## 2019-04-12 NOTE — Telephone Encounter (Signed)
Patient called nurse line stating her current inhaler, Proventil, is not working well. Patient stated she has been using her mothers inhaler, Pro Air, and this works much better. Patient is requesting Pro Air to be sent to her pharmacy. Please advise. [facility] on [address].

## 2019-04-21 ENCOUNTER — Other Ambulatory Visit: Payer: Self-pay

## 2019-04-21 ENCOUNTER — Telehealth (INDEPENDENT_AMBULATORY_CARE_PROVIDER_SITE_OTHER): Payer: Medicaid Other | Admitting: Family Medicine

## 2019-04-21 DIAGNOSIS — J441 Chronic obstructive pulmonary disease with (acute) exacerbation: Secondary | ICD-10-CM | POA: Diagnosis not present

## 2019-04-21 MED ORDER — PREDNISONE 20 MG PO TABS: 40.0000 mg | ORAL_TABLET | Freq: Every day | ORAL | 0 refills | Status: AC

## 2019-04-21 NOTE — Progress Notes (Signed)
BP- 126/ 64  WT- 300LB  T- No fever [facility] [address]

## 2019-04-21 NOTE — Progress Notes (Signed)
[place] Telemedicine Visit  Patient consented to have virtual visit. Method of visit: Telephone  Encounter participants: Patient: Carla Hanson - located at home Provider: Steve Rattler - located at [facility] Others (if applicable): none  Chief Complaint: wheezing, SOB  HPI:  Wheezing and shortness of breath for past 5 days Not coughing anything up  No fevers Eating and drinking okay No sick contacts Feels like the pollen has given her problems Last "bronchitis" flare "was a really long time ago"  Using albuterol every 4-6 hours, has not really noticed much improvement. Still has tightness.   She is taking dulera "when I need it"  ROS: per HPI  Pertinent PMHx: COPD  Exam:  Respiratory: speaks in clear full sentences, cough present  Assessment/Plan:  1. COPD exacerbation ([place]) Advised continued use of albuterol 2 puffs q4-6 hours, use dulera every day no matter what 2 puffs BID. Will do burst of steroids 40 mg x 5 days. rx sent to pharmacy. Advised her to follow up in person if no improvement in 2-3 days or if she has worsening SOB. Patient verbalized understanding and agreement with plan.   Time spent during visit with patient: 8 minutes  Lucila Maine, DO PGY-3, [place] [facility] 04/21/2019 4:51 PM

## 2019-04-22 ENCOUNTER — Telehealth: Payer: Medicaid Other | Admitting: Family Medicine

## 2019-04-27 ENCOUNTER — Other Ambulatory Visit: Payer: Self-pay

## 2019-04-27 ENCOUNTER — Ambulatory Visit: Payer: Medicaid Other | Admitting: Family Medicine

## 2019-04-27 ENCOUNTER — Ambulatory Visit (INDEPENDENT_AMBULATORY_CARE_PROVIDER_SITE_OTHER): Payer: Medicaid Other | Admitting: Family Medicine

## 2019-04-27 ENCOUNTER — Encounter: Payer: Self-pay | Admitting: Family Medicine

## 2019-04-27 ENCOUNTER — Ambulatory Visit
Admission: RE | Admit: 2019-04-27 | Discharge: 2019-04-27 | Disposition: A | Discharge status: Home / Self Care | Payer: Medicaid Other | Source: Ambulatory Visit | Attending: Family Medicine | Admitting: Family Medicine

## 2019-04-27 VITALS — BP 132/68 | HR 74

## 2019-04-27 DIAGNOSIS — M25561 Pain in right knee: Secondary | ICD-10-CM | POA: Diagnosis not present

## 2019-04-27 DIAGNOSIS — M1711 Unilateral primary osteoarthritis, right knee: Secondary | ICD-10-CM | POA: Diagnosis not present

## 2019-04-27 NOTE — Progress Notes (Signed)
    Subjective:    Patient ID: Carla Hanson, female    DOB: 03-11-1966, 53 y.o.   MRN: [id number]   CC: right knee pain  HPI: Has been doing home exercising the past 10 days in an effort to lose weight but now right knee is swollen and painful. This has happened in the past about 2 years ago after she twisted her right knee. She has been doing well since then. She is doing an at home exercise program and is motivated to lose weight. She reports her knee swelling is bothering her more than the pain- the pain is intermittent and fleeting. She is taking 500 mg tylenol as needed with some relief. She also has used icyhot with some relief. She has not tried any icing or other interventions. She cannot take NSAIDs given her hx of HTN, hemorrhagic stroke. She was prescribed voltaren gel in the past but could not afford it.  Smoking status reviewed- former smoker  Review of Systems- no fevers or chills, no other joints swollen, no knee redness   Objective:  BP 132/68   Pulse 74   SpO2 98%  Vitals and nursing note reviewed  General: well nourished, in no acute distress HEENT: normocephalic, MMM Cardiac: regular rate Respiratory: no increased work of breathing Extremities: excessive adipose tissue around knees bilaterally making it difficult to appreciate any effusion. Minimal joint line tenderness laterally of right knee. Crepitus with R knee extension. No increased warmth or redness. Left knee without tenderness. Neurovascularly in tact distally.  Skin: warm and dry, no rashes noted Neuro: alert and oriented, no focal deficits   Assessment & Plan:    1. Acute pain of right knee Patient feels knee is swollen but I am unable to appreciate that on exam given her body habitus. Will obtain standing xrays. Advised ice and scheduled tylenol. Patient unable to take NSAIDs. She was unable to afford voltaren gel previously prescribed to her. Referral made to sports med for further management. I  encouraged her to continue exercising with pain as her guide as weight loss is in her best interest and will help knees in the long run.  - Ambulatory referral to Sports Medicine - DG Knee Bilateral Standing AP; Future   Return if symptoms worsen or fail to improve.   Lucila Maine, DO Family Medicine Resident PGY-3

## 2019-04-27 NOTE — Patient Instructions (Addendum)
  Continue exercising! Let pain be your guide for whether or not to do an exercise.   Ice your knee liberally especially after exercising  Take tylenol scheduled 3 times a day  Please go to [place] to get your knee x-ray.  I have also referred you to Sports Medicine for management of knee pain. You will receive a call from our office to schedule this appointment. If you do not hear from our office in 1-2 weeks please call us to check on the status of your referral at [phone].  Lucila Maine, DO PGY-3, [facility] 04/27/2019 11:04 AM

## 2019-04-28 NOTE — Progress Notes (Signed)
I called Carla Hanson and relayed her results. Mild arthritis in the right medial knee compartment. She has appt w/ sports med on 6/30 and I encouraged her to keep that appointment. She reports the icing is helping and today feels slightly better.

## 2019-05-04 ENCOUNTER — Encounter: Payer: Self-pay | Admitting: Family Medicine

## 2019-05-04 ENCOUNTER — Ambulatory Visit (INDEPENDENT_AMBULATORY_CARE_PROVIDER_SITE_OTHER): Payer: Medicaid Other | Admitting: Family Medicine

## 2019-05-04 ENCOUNTER — Other Ambulatory Visit: Payer: Self-pay

## 2019-05-04 VITALS — Ht 64.0 in

## 2019-05-04 DIAGNOSIS — M1711 Unilateral primary osteoarthritis, right knee: Secondary | ICD-10-CM

## 2019-05-04 NOTE — Patient Instructions (Signed)
Your pain is due to arthritis. These are the different medications you can take for this: Tylenol 500mg  1-2 tabs three times a day for pain. Capsaicin, aspercreme, or biofreeze topically up to four times a day may also help with pain. Some supplements that may help for arthritis: Boswellia extract, curcumin, pycnogenol Cortisone injections are an option. If cortisone injections do not help, there are different types of shots that may help but they take longer to take effect. It's important that you continue to stay active. Straight leg raises, knee extensions 3 sets of 10 once a day (add ankle weight if these become too easy). Start physical therapy to strengthen muscles around the joint that hurts to take pressure off of the joint itself. Shoe inserts with good arch support may be helpful. Heat or ice 15 minutes at a time 3-4 times a day as needed to help with pain. Water aerobics and cycling with low resistance are the best two types of exercise for arthritis though any exercise is ok as long as it doesn't worsen the pain. Follow up with me in 1 month.

## 2019-05-04 NOTE — Progress Notes (Signed)
PCP and consultation requested by: Steve Rattler, DO  Subjective:   HPI: Patient is a 53 y.o. female here for right knee pain.  Patient reports she's had problems with knee pain previously. States about 2-3 weeks ago she started working out more doing walking for exercise. No acute injury but after this felt pain anterior right knee more than the left with associated swelling. Pain medial and lateral right knee. Does feel like knee will 'slip' at times. Associated popping and cracking. Pain is 0/10 at rest, feels more after prolonged rest, an ache. Could not afford voltaren gel. Taking tylenol, icyhot. No skin changes, numbness.  Past Medical History:  Diagnosis Date  . Anxiety   . Bipolar disorder ([place])   . Hyperlipidemia   . Hypertension   . Lipoma    Abdomen  . LIPOMA 01/20/2008  . Obesity   . Pneumonia   . Tobacco abuse     Current Outpatient Medications on File Prior to Visit  Medication Sig Dispense Refill  . acetaminophen (TYLENOL) 325 MG tablet Take 650 mg by mouth every 6 (six) hours as needed for mild pain.    Marland Kitchen albuterol (VENTOLIN HFA) 108 (90 Base) MCG/ACT inhaler Inhale 2 puffs into the lungs every 4 (four) hours as needed for wheezing (or cough). 2 Inhaler 2  . aspirin EC 81 MG EC tablet Take 1 tablet (81 mg total) by mouth daily. 30 tablet 12  . ferrous sulfate 325 (65 FE) MG tablet Take 1 tablet (325 mg total) by mouth 2 (two) times daily with a meal. 60 tablet 2  . hydrochlorothiazide (MICROZIDE) 12.5 MG capsule Take 1 capsule (12.5 mg total) by mouth daily. 90 capsule 2  . losartan (COZAAR) 50 MG tablet Take 1 tablet (50 mg total) by mouth daily. 90 tablet 2  . mometasone-formoterol (DULERA) 100-5 MCG/ACT AERO Inhale 2 puffs into the lungs 2 times daily at 12 noon and 4 pm. 8.8 g 1   No current facility-administered medications on file prior to visit.     Past Surgical History:  Procedure Laterality Date  . CESAREAN SECTION    . CYSTECTOMY    .  LIPOMA EXCISION  03/2011  . ORIF ANKLE FRACTURE  03/13/2012   Procedure: OPEN REDUCTION INTERNAL FIXATION (ORIF) ANKLE FRACTURE;  Surgeon: Jessy Oto, MD;  Location: [facility];  Service: Orthopedics;  Laterality: Right;    Allergies  Allergen Reactions  . Latuda [Lurasidone] Other (See Comments)    Made leg muscles twitch (went off this herself in December, 2017)    Social History   Socioeconomic History  . Marital status: Legally Separated    Spouse name: Not on file  . Number of children: 4  . Years of education: 10  . Highest education level: Not on file  Occupational History  . Occupation: N/A  Social Needs  . Financial resource strain: Not on file  . Food insecurity    Worry: Not on file    Inability: Not on file  . Transportation needs    Medical: Not on file    Non-medical: Not on file  Tobacco Use  . Smoking status: Former Smoker    Packs/day: 0.30    Years: 30.00    Pack years: 9.00    Types: Cigarettes    Start date: 11/04/1981    Quit date: 10/2016    Years since quitting: 2.5  . Smokeless tobacco: Never Used  Substance and Sexual Activity  . Alcohol use: No  Alcohol/week: 0.0 standard drinks    Comment: Quit 10/2016  . Drug use: No  . Sexual activity: Not on file    Comment: tubal  Lifestyle  . Physical activity    Days per week: Not on file    Minutes per session: Not on file  . Stress: Not on file  Relationships  . Social Herbalist on phone: Not on file    Gets together: Not on file    Attends religious service: Not on file    Active member of club or organization: Not on file    Attends meetings of clubs or organizations: Not on file    Relationship status: Not on file  . Intimate partner violence    Fear of current or ex partner: Not on file    Emotionally abused: Not on file    Physically abused: Not on file    Forced sexual activity: Not on file  Other Topics Concern  . 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  Considering nursing.    Lives at home alone   Right-handed   Caffeine: not much    Family History  Problem Relation Age of Onset  . Asthma Father   . Heart attack Father   . Other Mother        High blood pressure runs in the family  . Diabetes Maternal Aunt     Ht 5\' 4"  (1.626 m)   BMI 52.52 kg/m   Review of Systems: See HPI above.     Objective:  Physical Exam:  Gen: NAD, comfortable in exam room  Right knee: No obvious effusion but difficult to assess due to habitus.  No gross deformity, ecchymoses. TTP medial > lateral joint line.  No other tenderness. FROM with 5/5 strength flexion and extension.. Negative ant/post drawers. Negative valgus/varus testing. Negative lachmans. Negative mcmurrays, apleys, patellar apprehension. NV intact distally.  Left knee: No deformity. FROM with 5/5 strength. No tenderness to palpation. NVI distally.   Assessment & Plan:  1. Right knee pain - independently reviewed radiographs and noted mild medial arthritis consistent with her history and exam.  Exam otherwise reassuring.  Tylenol, topical medications, supplements reviewed.  Avoid NSAIDs with cardiac, HTN history.  Ice or heat.  Start physical therapy.  Consider injection - declined at this time.  F/u in 1 month.

## 2019-05-11 ENCOUNTER — Telehealth: Payer: Self-pay | Admitting: *Deleted

## 2019-05-11 NOTE — Telephone Encounter (Signed)
Pt is requesting to become a pt of Dr.Eniola since Dr. Vanetta Shawl is no longer here.  She states that she has seen her before and really liked her.  Advised I would send a message to MD.  Christen Bame, CMA

## 2019-05-11 NOTE — Telephone Encounter (Signed)
That will be fine. Thanks 

## 2019-05-12 ENCOUNTER — Other Ambulatory Visit: Payer: Self-pay

## 2019-05-12 ENCOUNTER — Other Ambulatory Visit: Payer: Self-pay | Admitting: *Deleted

## 2019-05-12 ENCOUNTER — Ambulatory Visit (INDEPENDENT_AMBULATORY_CARE_PROVIDER_SITE_OTHER): Payer: Medicaid Other | Admitting: Family Medicine

## 2019-05-12 DIAGNOSIS — J449 Chronic obstructive pulmonary disease, unspecified: Secondary | ICD-10-CM

## 2019-05-12 DIAGNOSIS — R251 Tremor, unspecified: Secondary | ICD-10-CM | POA: Insufficient documentation

## 2019-05-12 HISTORY — DX: Tremor, unspecified: R25.1

## 2019-05-12 MED ORDER — MOMETASONE FURO-FORMOTEROL FUM 100-5 MCG/ACT IN AERO: 2.0000 | INHALATION_SPRAY | Freq: Two times a day (BID) | RESPIRATORY_TRACT | 3 refills | Status: DC

## 2019-05-12 NOTE — Progress Notes (Signed)
     Subjective: Chief Complaint  Patient presents with  . Tremors     HPI: Carla Hanson is a 53 y.o. presenting to clinic today to discuss the following:  Tremors Started "years ago" before her stroke she had 2 years ago. They occur about 2 every 6 months and do not seem to be getting worse but not getting better. She describes the tremors as starting in her thighs and it is movement that she cannot control. It then spreads down her legs and to her arms and is just an uncontrolled shaking. She has not noticed any involuntary movement of her hands or fingers and denies any difficulty walking. When she feels them start she gets nervous but not before and does not endorse any type of generalized anxiety. When they occur they seem random and she does not develop any other associated symptoms such as LOC, dizziness, loss of bowel or bladder function, or pain. She denies any nausea or vomiting during these episodes. She does endorse having palpitations and some mild LE swelling. No rash or vision changes and she is completely conscious during the entire episode. No headaches  ROS noted in HPI.   Past Medical, Surgical, Social, and Family History Reviewed & Updated per EMR.   Pertinent Historical Findings include:   Social History   Tobacco Use  Smoking Status Former Smoker  . Packs/day: 0.30  . Years: 30.00  . Pack years: 9.00  . Types: Cigarettes  . Start date: 11/04/1981  . Quit date: 10/2016  . Years since quitting: 2.6  Smokeless Tobacco Never Used    Objective: BP (!) 145/80   Pulse 80   SpO2 97%  Vitals and nursing notes reviewed  Physical Exam Gen: Alert and Oriented x 3, NAD HEENT: Normocephalic, atraumatic CV: RRR, no murmurs, normal S1, S2 split Resp: CTAB, no wheezing, rales, or rhonchi, comfortable work of breathing Abd: non-distended, non-tender, soft, +bs in all four quadrants Ext: no clubbing, cyanosis, mild LE non-pitting edema Neuro: CN II-XII intact, no  shuffling gait, normal finger to nose, UE and LE strength 5/5, gross sensation intact Skin: warm, dry, intact, no rashes  Assessment/Plan:  Occasional tremors Unclear cause. Not consistent with essential tremor as it is not provoked by movement. I did consider anxiety as playing a role but she denies feeling anxious in general and only gets "scared" when the actual tremor starts making anxiety the cause unlikely. She has no symptoms consistent with Parkinson's and no memory issues or concerns consistent with Lewy Body Dementia. Given the long history tremors and it is not getting worse with a normal neuro exam we discussed not getting any imaging as this is not likely caused by any tumor. She agreed with the plan. - cont to monitor; if she returns to clinic with increased or changing symptoms I would start with a head MRI.   PATIENT EDUCATION PROVIDED: See AVS    Diagnosis and plan along with any newly prescribed medication(s) were discussed in detail with this patient today. The patient verbalized understanding and agreed with the plan. Patient advised if symptoms worsen return to clinic or ER.    Harolyn Rutherford, DO 05/12/2019, 2:19 PM PGY-3 [place]

## 2019-05-12 NOTE — Patient Instructions (Signed)
It was great to meet you today! Thank you for letting me participate in your care!  Today, we discussed your history of episodic tremors. I don't have a definitive diagnosis for you today but please keep monitoring them and return if they become more frequent, more intense, and/or start interfering with your everyday life.   Try to keep a diary and record exactly what happens during these episodes.  Be well, Carla Rutherford, DO PGY-3, [facility]

## 2019-05-13 ENCOUNTER — Encounter: Payer: Self-pay | Admitting: Physical Therapy

## 2019-05-13 ENCOUNTER — Other Ambulatory Visit: Payer: Self-pay

## 2019-05-13 ENCOUNTER — Ambulatory Visit: Payer: Medicaid Other | Attending: Sports Medicine | Admitting: Physical Therapy

## 2019-05-13 DIAGNOSIS — M25561 Pain in right knee: Secondary | ICD-10-CM | POA: Diagnosis not present

## 2019-05-13 DIAGNOSIS — M25661 Stiffness of right knee, not elsewhere classified: Secondary | ICD-10-CM

## 2019-05-13 DIAGNOSIS — R262 Difficulty in walking, not elsewhere classified: Secondary | ICD-10-CM

## 2019-05-13 DIAGNOSIS — M1711 Unilateral primary osteoarthritis, right knee: Secondary | ICD-10-CM | POA: Insufficient documentation

## 2019-05-13 NOTE — Addendum Note (Signed)
Addended by: Herma Carson on: 05/13/2019 06:12 PM   Modules accepted: Orders

## 2019-05-13 NOTE — Therapy (Signed)
[place] [place], [place], [address] Phone: [phone]   Fax:  [phone]  Physical Therapy Evaluation  Patient Details  Name: Carla Hanson MRN: [id number] Date of Birth: [birth year] Referring Provider (PT): Lilia Argue, DO   Encounter Date: 05/13/2019  PT End of Session - 05/13/19 1703    Visit Number  1    Number of Visits  13    Date for PT Re-Evaluation  07/08/19    Authorization Type  Medicaid-requesting 3 visits    PT Start Time  1645    PT Stop Time  1730    PT Time Calculation (min)  45 min    Activity Tolerance  Patient limited by pain    Behavior During Therapy  [facility] for tasks assessed/performed       Past Medical History:  Diagnosis Date  . Anxiety   . Bipolar disorder ([place])   . Hyperlipidemia   . Hypertension   . Lipoma    Abdomen  . LIPOMA 01/20/2008  . Obesity   . Pneumonia   . Tobacco abuse     Past Surgical History:  Procedure Laterality Date  . CESAREAN SECTION    . CYSTECTOMY    . LIPOMA EXCISION  03/2011  . ORIF ANKLE FRACTURE  03/13/2012   Procedure: OPEN REDUCTION INTERNAL FIXATION (ORIF) ANKLE FRACTURE;  Surgeon: Jessy Oto, MD;  Location: [facility];  Service: Orthopedics;  Laterality: Right;    There were no vitals filed for this visit.   Subjective Assessment - 05/13/19 1608    Subjective  Pt. reports insidious onset onset right knee pain around early June-no mechanism of injury noted but had increased activity with walking/exercise. X-rays showed mild degenerative changes in both knees.    Pertinent History  ICH, seizure, ORIF right ankle 03/13/2012, BiPolar, HTN, Lipoma, obesity, DVT, COPD    Limitations  Standing;Walking;House hold activities    How long can you stand comfortably?  15 minutes    How long can you walk comfortably?  15-20 minutes but reports has to hold on to shopping cart with prolonged walking for shopping.    Diagnostic tests  X-rays    Patient  Stated Goals  "stand properly", learn how to self-manage symptoms    Currently in Pain?  Yes    Pain Score  8     Pain Location  Knee    Pain Orientation  Right    Pain Descriptors / Indicators  Sore    Pain Type  Acute pain    Pain Onset  1 to 4 weeks ago    Pain Frequency  Constant    Aggravating Factors   standing and walking    Pain Relieving Factors  rest, ice, elevation    Effect of Pain on Daily Activities  limits tolerance and ability for standing and walking, ability to exercise         [facility] PT Assessment - 05/13/19 0001      Assessment   Medical Diagnosis  Right knee arthritis    Referring Provider (PT)  Lilia Argue, DO    Onset Date/Surgical Date  04/13/19    Hand Dominance  Right    Prior Therapy  past PT last year for LBP      Precautions   Precautions  None      Restrictions   Weight Bearing Restrictions  No      Balance Screen   Has the patient fallen in the past  6 months  No      Home Film/video editor residence      Prior Function   Level of Independence  Independent with community mobility without device      Cognition   Overall Cognitive Status  Within Functional Limits for tasks assessed      AROM   Overall AROM Comments  Hip AROM/PROM grossly WFL    AROM Assessment Site  Knee    Right/Left Knee  Right;Left    Right Knee Extension  0    Right Knee Flexion  100    Left Knee Extension  0    Left Knee Flexion  105      Strength   Strength Assessment Site  Hip;Knee    Right/Left Hip  Right;Left    Right Hip Flexion  4-/5    Right Hip Extension  4-/5    Right Hip External Rotation   4-/5    Right Hip Internal Rotation  4-/5    Right Hip ABduction  4-/5    Right Hip ADduction  4-/5    Left Hip Flexion  4/5    Left Hip Extension  4+/5    Left Hip External Rotation  4+/5    Left Hip Internal Rotation  5/5    Left Hip ABduction  4/5    Left Hip ADduction  4+/5    Right/Left Knee  Right;Left    Right Knee  Flexion  4/5    Right Knee Extension  4/5    Left Knee Flexion  5/5    Left Knee Extension  5/5      Palpation   Palpation comment  global tenderness to palpation right knee medial/lateral joint line and anterior knee      Special Tests   Other special tests  Varus/valgus stress tests and anterior/posterior drawer (-), pain but no crepitus with McMurray's      Ambulation/Gait   Gait Comments  Pt. ambulates independently without AD with antalgia gait                Objective measurements completed on examination: See above findings.      [facility] Adult PT Treatment/Exercise - 05/13/19 0001      Knee/Hip Exercises: Seated   Long Arc Quad  AROM;Right;20 reps    Long [employer] Limitations  with alt. ankle PF/DF for knee swelling      Knee/Hip Exercises: Supine   Other Supine Knee/Hip Exercises  additional HEP instruction quad sets, SAQ, heel raises and standing hip abduction             PT Education - 05/13/19 1643    Education provided  Yes    Education Details  HEP, knee anatomy and symptoms etiology, POC    Person(s) Educated  Patient    Methods  Explanation;Demonstration;Verbal cues;Handout    Comprehension  Verbalized understanding;Returned demonstration       PT Short Term Goals - 05/13/19 1742      PT SHORT TERM GOAL #1   Title  Independent with basic HEP    Baseline  instructed today    Time  4    Period  Weeks    Status  New    Target Date  06/10/19      PT SHORT TERM GOAL #2   Title  Increase right knee flexion AROM 5 deg or greater to improve ability to navigate stairs and donn shoes    Baseline  100  deg    Time  4    Period  Weeks    Status  New    Target Date  06/10/19      PT SHORT TERM GOAL #3   Title  Increase right quadricep strength at least 1/2 MMT grade to improve knee stability for community ambulation and stair navigation    Baseline  4/5    Time  4    Period  Weeks    Status  New    Target Date  06/10/19        PT Long  Term Goals - 05/13/19 1744      PT LONG TERM GOAL #1   Title  Right knee strength 5/5 to improve knee stability for community ambulation including over uneven surfaces as well as stair navigation    Baseline  4/5    Time  8    Period  Weeks    Status  New    Target Date  07/08/19      PT LONG TERM GOAL #2   Title  Tolerate standing at least 30 min or greater for doing chores and cooking at home    Baseline  limited to 15 minutes "on a good day" per pt.    Time  8    Period  Weeks    Status  New    Target Date  07/08/19      PT LONG TERM GOAL #3   Title  Tolerate ambulation periods 30-40 min for grocery shopping without having to lean heavily on grocery cart with knee pain 3/10 or less    Baseline  limited to 15-20 minutes and reports reliance on leaning on cart    Time  8    Period  Weeks    Status  New    Target Date  06/10/19      PT LONG TERM GOAL #4   Title  Independent with advanced HEP for continued independent progression after d/c from formal therapy    Baseline  basic HEP at eval    Time  8    Period  Weeks    Status  New    Target Date  07/08/19             Plan - 05/13/19 1704    Clinical Impression Statement  Pt. presents with right knee pain with decreased ROM/stiffness, swelling and associated muscle weakness. Global tenderness to palpation along medial and lateral joint lines and anterior knee-no clinically significant ligamentous laxity noted but difficulty assessing special test due to tenderness/findings equivocal. Pt. would benefit from PT to help decrease knee pain and address current associated functional limitations for mobility.    Personal Factors and Comorbidities  Comorbidity 3+    Comorbidities  obesity, COPD, anxiety, bipolar    Examination-Participation Restrictions  Cleaning;Community Activity;Shop    Stability/Clinical Decision Making  Evolving/Moderate complexity    Clinical Decision Making  Moderate    Clinical Presentation due to:   high pain level and difficulty tolerating ROM during assessment but mild OA per imaging, differential diagnosis OA vs. meniscal or soft tissue contributions    Rehab Potential  Fair    PT Frequency  1x / week    PT Duration  4 weeks    PT Treatment/Interventions  ADLs/Self Care Home Management;Cryotherapy;Electrical Stimulation;Therapeutic activities;Therapeutic exercise;Patient/family education;Neuromuscular re-education;Functional mobility training;Iontophoresis 4mg /ml Dexamethasone;Passive range of motion;Vasopneumatic Device;Taping    PT Next Visit Plan  review HEP as needed, NUSTEP warm up, initial open chain  knee and hip ROM/strengthening focus but add/progress closed chain + functional activities as tolerated pending pain    PT Home Exercise Plan  LAQ, quad sets, SAQ, standing hip abduction, heel raises    Consulted and Agree with Plan of Care  Patient       Patient will benefit from skilled therapeutic intervention in order to improve the following deficits and impairments:  Difficulty walking, Decreased strength, Decreased activity tolerance, Decreased range of motion, Increased edema, Obesity, Pain, Impaired flexibility  Visit Diagnosis: 1. Primary osteoarthritis of right knee   2. Acute pain of right knee   3. Stiffness of right knee, not elsewhere classified   4. Difficulty in walking, not elsewhere classified        Problem List Patient Active Problem List   Diagnosis Date Noted  . Occasional tremors 05/12/2019  . COPD, moderate ([place]) 12/11/2018  . Influenza A 12/01/2018  . Notalgia 02/11/2018  . Anemia 02/11/2018  . Chronic right shoulder pain 01/21/2018  . Chronic bilateral low back pain with left-sided sciatica 12/14/2017  . History of stroke 08/13/2017  . Irregular menstrual cycle 08/13/2017  . Iron deficiency anemia 12/12/2016  . Seizure ([place])   . ICH (intracerebral hemorrhage) ([place]) 11/08/2016  . COPD exacerbation ([place]) 05/10/2016  . History of alcohol  dependence ([place]) 01/11/2016  . Left leg DVT ([place]) 09/29/2013  . Pre-diabetes 03/02/2013  . History of pulmonary embolism 05/05/2012  . Painful menstrual periods 01/05/2012  . Bipolar disorder ([place]) 07/03/2010  . History of tobacco abuse 08/23/2009  . Hyperlipidemia 04/30/2007  . Class 3 severe obesity due to excess calories with serious comorbidity and body mass index (BMI) of 50.0 to 59.9 in adult ([place]) 01/01/2007  . HYPERTENSION, BENIGN SYSTEMIC 01/01/2007    Beaulah Dinning, PT, DPT 05/13/19 5:59 PM  [facility] [address] [place], [place], [address] Phone: [phone]   Fax:  [phone]  Name: Carla Hanson MRN: [id number] Date of Birth: 1966-05-26

## 2019-05-14 NOTE — Assessment & Plan Note (Signed)
Unclear cause. Not consistent with essential tremor as it is not provoked by movement. I did consider anxiety as playing a role but she denies feeling anxious in general and only gets "scared" when the actual tremor starts making anxiety the cause unlikely. She has no symptoms consistent with Parkinson's and no memory issues or concerns consistent with Lewy Body Dementia. Given the long history tremors and it is not getting worse with a normal neuro exam we discussed not getting any imaging as this is not likely caused by any tumor. She agreed with the plan. - cont to monitor; if she returns to clinic with increased or changing symptoms I would start with a head MRI.

## 2019-05-25 ENCOUNTER — Ambulatory Visit: Payer: Medicaid Other | Admitting: Physical Therapy

## 2019-05-25 ENCOUNTER — Other Ambulatory Visit: Payer: Self-pay

## 2019-05-25 ENCOUNTER — Encounter: Payer: Self-pay | Admitting: Physical Therapy

## 2019-05-25 DIAGNOSIS — M25561 Pain in right knee: Secondary | ICD-10-CM

## 2019-05-25 DIAGNOSIS — M25661 Stiffness of right knee, not elsewhere classified: Secondary | ICD-10-CM | POA: Diagnosis not present

## 2019-05-25 DIAGNOSIS — M1711 Unilateral primary osteoarthritis, right knee: Secondary | ICD-10-CM

## 2019-05-25 DIAGNOSIS — R262 Difficulty in walking, not elsewhere classified: Secondary | ICD-10-CM | POA: Diagnosis not present

## 2019-05-25 NOTE — Therapy (Signed)
[place], [place], [address] Phone: [phone]   Fax:  [phone]  Physical Therapy Treatment  Patient Details  Name: Carla Hanson MRN: [id number] Date of Birth: 1966-03-26 Referring Provider (PT): Lilia Argue, DO   Encounter Date: 05/25/2019  PT End of Session - 05/25/19 1131    Visit Number  2    Number of Visits  13    Date for PT Re-Evaluation  07/08/19    Authorization Type  Medicaid-requesting 3 visits    Authorization Time Period  approved 3 visits through 7/16 -8/3    Authorization - Visit Number  1    Authorization - Number of Visits  3    PT Start Time  1132    PT Stop Time  1217    PT Time Calculation (min)  45 min    Activity Tolerance  Patient tolerated treatment well       Past Medical History:  Diagnosis Date  . Anxiety   . Bipolar disorder ([place])   . Hyperlipidemia   . Hypertension   . Lipoma    Abdomen  . LIPOMA 01/20/2008  . Obesity   . Pneumonia   . Tobacco abuse     Past Surgical History:  Procedure Laterality Date  . CESAREAN SECTION    . CYSTECTOMY    . LIPOMA EXCISION  03/2011  . ORIF ANKLE FRACTURE  03/13/2012   Procedure: OPEN REDUCTION INTERNAL FIXATION (ORIF) ANKLE FRACTURE;  Surgeon: Jessy Oto, MD;  Location: [facility];  Service: Orthopedics;  Laterality: Right;    There were no vitals filed for this visit.  Subjective Assessment - 05/25/19 1136    Subjective  Pt reports she still has soreness and stiffness, participating in water classes 3x/wk and doing HEP at home.    Currently in Pain?  Yes    Pain Score  3     Pain Location  Knee    Pain Orientation  Right    Pain Descriptors / Indicators  Sore;Other (Comment)    Pain Frequency  Constant    Aggravating Factors   after sitting for a long time    Pain Relieving Factors  rest and ice.         [facility] PT Assessment - 05/25/19 0001      Assessment   Medical Diagnosis  Right knee arthritis     Referring Provider (PT)  Lilia Argue, DO                   [facility] Adult PT Treatment/Exercise - 05/25/19 0001      Knee/Hip Exercises: Stretches   Quad Stretch  Both;3 reps;30 seconds   s/l with strap     Knee/Hip Exercises: Aerobic   Nustep  L4x5'      Knee/Hip Exercises: Standing   Terminal Knee Extension  Strengthening;Right;10 reps   5 sec hold pressing into ball on wall   Terminal Knee Extension Limitations  verbal cues for good alignment    Hip ADduction  Strengthening;Both;2 sets;10 reps    Hip ADduction Limitations  pt ER hip - VC to keep toes FWD    Functional Squat  10 reps;2 sets   mini squats   Functional Squat Limitations  tapping buttocks to wall       Knee/Hip Exercises: Seated   Long Arc Quad  Strengthening;Right;2 sets;10 reps;Weights    Long Arc Quad Weight  5 lbs.    Other Seated Knee/Hip Exercises  30 reps hip abduction , green band around knees, feet supported.       Knee/Hip Exercises: Supine   Monico Blitz;Both;10 reps;1 set   some back/sacral pain    Straight Leg Raise with External Rotation  Strengthening;Right;2 sets;10 reps             PT Education - 05/25/19 1210    Education provided  Yes    Education Details  s/l quad stretch    Person(s) Educated  Patient    Methods  Explanation;Handout    Comprehension  Verbalized understanding;Returned demonstration       PT Short Term Goals - 05/13/19 1742      PT SHORT TERM GOAL #1   Title  Independent with basic HEP    Baseline  instructed today    Time  4    Period  Weeks    Status  New    Target Date  06/10/19      PT SHORT TERM GOAL #2   Title  Increase right knee flexion AROM 5 deg or greater to improve ability to navigate stairs and donn shoes    Baseline  100 deg    Time  4    Period  Weeks    Status  New    Target Date  06/10/19      PT SHORT TERM GOAL #3   Title  Increase right quadricep strength at least 1/2 MMT grade to improve knee stability for  community ambulation and stair navigation    Baseline  4/5    Time  4    Period  Weeks    Status  New    Target Date  06/10/19        PT Long Term Goals - 05/13/19 1744      PT LONG TERM GOAL #1   Title  Right knee strength 5/5 to improve knee stability for community ambulation including over uneven surfaces as well as stair navigation    Baseline  4/5    Time  8    Period  Weeks    Status  New    Target Date  07/08/19      PT LONG TERM GOAL #2   Title  Tolerate standing at least 30 min or greater for doing chores and cooking at home    Baseline  limited to 15 minutes "on a good day" per pt.    Time  8    Period  Weeks    Status  New    Target Date  07/08/19      PT LONG TERM GOAL #3   Title  Tolerate ambulation periods 30-40 min for grocery shopping without having to lean heavily on grocery cart with knee pain 3/10 or less    Baseline  limited to 15-20 minutes and reports reliance on leaning on cart    Time  8    Period  Weeks    Status  New    Target Date  06/10/19      PT LONG TERM GOAL #4   Title  Independent with advanced HEP for continued independent progression after d/c from formal therapy    Baseline  basic HEP at eval    Time  8    Period  Weeks    Status  New    Target Date  07/08/19            Plan - 05/25/19 1202    Clinical Impression Statement  This  is Carla Hanson's first visit after her eval.  She is doing her HEP and water classes.  She has weakness in her legs and requires some rests.  Pt tends to hyperextend her knees for stability in standing. She is highly motivated to improve.Limited ability to perform exercise in supine d/t sacral pain.    PT Duration  8 weeks    PT Treatment/Interventions  ADLs/Self Care Home Management;Cryotherapy;Electrical Stimulation;Therapeutic activities;Therapeutic exercise;Patient/family education;Neuromuscular re-education;Functional mobility training;Iontophoresis 4mg /ml Dexamethasone;Passive range of  motion;Vasopneumatic Device;Taping    PT Next Visit Plan  progress lower body strengthening.    Consulted and Agree with Plan of Care  Patient       Patient will benefit from skilled therapeutic intervention in order to improve the following deficits and impairments:  Difficulty walking, Decreased strength, Decreased activity tolerance, Decreased range of motion, Increased edema, Obesity, Pain, Impaired flexibility  Visit Diagnosis: 1. Acute pain of right knee   2. Primary osteoarthritis of right knee   3. Stiffness of right knee, not elsewhere classified   4. Difficulty in walking, not elsewhere classified        Problem List Patient Active Problem List   Diagnosis Date Noted  . Occasional tremors 05/12/2019  . COPD, moderate ([place]) 12/11/2018  . Influenza A 12/01/2018  . Notalgia 02/11/2018  . Anemia 02/11/2018  . Chronic right shoulder pain 01/21/2018  . Chronic bilateral low back pain with left-sided sciatica 12/14/2017  . History of stroke 08/13/2017  . Irregular menstrual cycle 08/13/2017  . Iron deficiency anemia 12/12/2016  . Seizure ([place])   . ICH (intracerebral hemorrhage) ([place]) 11/08/2016  . COPD exacerbation ([place]) 05/10/2016  . History of alcohol dependence ([place]) 01/11/2016  . Left leg DVT ([place]) 09/29/2013  . Pre-diabetes 03/02/2013  . History of pulmonary embolism 05/05/2012  . Painful menstrual periods 01/05/2012  . Bipolar disorder ([place]) 07/03/2010  . History of tobacco abuse 08/23/2009  . Hyperlipidemia 04/30/2007  . Class 3 severe obesity due to excess calories with serious comorbidity and body mass index (BMI) of 50.0 to 59.9 in adult ([place]) 01/01/2007  . HYPERTENSION, BENIGN SYSTEMIC 01/01/2007    Jeral Pinch PT  05/25/2019, 12:18 PM  [facility] [address] [place], [place], [address] Phone: [phone]   Fax:  [phone]  Name: Carla Hanson MRN: [id number] Date of Birth: February 11, 1966

## 2019-05-28 ENCOUNTER — Encounter: Payer: Self-pay | Admitting: Family Medicine

## 2019-06-01 ENCOUNTER — Ambulatory Visit (INDEPENDENT_AMBULATORY_CARE_PROVIDER_SITE_OTHER): Payer: Medicaid Other | Admitting: Sports Medicine

## 2019-06-01 ENCOUNTER — Ambulatory Visit: Payer: Medicaid Other | Admitting: Physical Therapy

## 2019-06-01 ENCOUNTER — Other Ambulatory Visit: Payer: Self-pay

## 2019-06-01 ENCOUNTER — Encounter: Payer: Self-pay | Admitting: Sports Medicine

## 2019-06-01 VITALS — BP 140/71 | Ht 64.0 in

## 2019-06-01 DIAGNOSIS — M1711 Unilateral primary osteoarthritis, right knee: Secondary | ICD-10-CM

## 2019-06-01 NOTE — Progress Notes (Signed)
SUBJECTIVE:      Chief Complaint:  Right knee pain   HPI:  Carla Hanson is following up for right knee pain.  Since last visit she has started formal physical therapy and has noticed 50% improvement in her pain.  She has also lost 2 pounds since her last appointment.  [facility] 3 times a week.  And she is doing home exercise 7 days a week.  She endorses intermittent lateral lower leg pain that comes and goes.  At rest her pain is 0 out of 10, and she only notices pain after a workout or exercise.     ROS:     See HPI. All other reviewed systems negative.   PERTINENT  PMH / PSH / FH / SH:  Past Medical, Surgical, Social, and Family History Reviewed & Updated in the EMR.  Pertinent findings include:  Morbid obesity, tobacco abuse   OBJECTIVE:  BP 140/71   Ht 5\' 4"  (1.626 m)   BMI 52.52 kg/m  Physical Exam:  Vital signs are reviewed.   GEN: Alert and oriented, NAD Pulm: Breathing unlabored PSY: normal mood, congruent affect  MSK: Right knee: No obvious effusion but difficult to assess due to habitus.  No ecchymoses TTP medial > lateral joint line. Mild TTP along lateral lower leg. Varicosities noted on lower leg  No other tenderness. FROM with 5/5 strength flexion and extension.. Negative ant/post drawers. Negative valgus/varus testing. Negative lachmans. Negative mcmurrays, apleys, patellar apprehension. NV intact distally.  ASSESSMENT & PLAN:   1. Right knee pain Likely secondary to mild osteoarthritis.  She is progressing well with her exercise and rehab plan.  We discussed diet and exercise benefits, specifically losing weight.  Continue formal physical therapy and water therapy.  She will follow-up as needed   Lanier Clam, DO, ATC Sports Medicine Fellow  Patient seen and evaluated with the sports medicine fellow.  I agree with the above plan of care.  Patient is progressing well with formal PT and home exercises.  She may wean  from formal physical therapy per the therapist discretion.  We also discussed the importance of weight loss.  Follow-up as needed.

## 2019-06-02 ENCOUNTER — Ambulatory Visit: Payer: Medicaid Other | Admitting: Physical Therapy

## 2019-06-02 ENCOUNTER — Encounter: Payer: Self-pay | Admitting: Physical Therapy

## 2019-06-02 DIAGNOSIS — R262 Difficulty in walking, not elsewhere classified: Secondary | ICD-10-CM | POA: Diagnosis not present

## 2019-06-02 DIAGNOSIS — M25561 Pain in right knee: Secondary | ICD-10-CM

## 2019-06-02 DIAGNOSIS — M1711 Unilateral primary osteoarthritis, right knee: Secondary | ICD-10-CM

## 2019-06-02 DIAGNOSIS — M25661 Stiffness of right knee, not elsewhere classified: Secondary | ICD-10-CM | POA: Diagnosis not present

## 2019-06-02 NOTE — Therapy (Addendum)
[place], [place], [address] Phone: [phone]   Fax:  [phone]  Physical Therapy Treatment/Discharge   Patient Details  Name: Carla Hanson MRN: [id number] Date of Birth: 03-18-1966 Referring Provider (PT): Lilia Argue, DO   Encounter Date: 06/02/2019  PT End of Session - 06/02/19 1052    Visit Number  3    Number of Visits  13    Date for PT Re-Evaluation  07/08/19    Authorization Type  [place] 3 visits    Authorization - Visit Number  2    Authorization - Number of Visits  3    PT Start Time  7530    PT Stop Time  1123    PT Time Calculation (min)  38 min    Activity Tolerance  Patient tolerated treatment well    Behavior During Therapy  [facility] for tasks assessed/performed       Past Medical History:  Diagnosis Date  . Anxiety   . Bipolar disorder ([place])   . Hyperlipidemia   . Hypertension   . Lipoma    Abdomen  . LIPOMA 01/20/2008  . Obesity   . Pneumonia   . Tobacco abuse     Past Surgical History:  Procedure Laterality Date  . CESAREAN SECTION    . CYSTECTOMY    . LIPOMA EXCISION  03/2011  . ORIF ANKLE FRACTURE  03/13/2012   Procedure: OPEN REDUCTION INTERNAL FIXATION (ORIF) ANKLE FRACTURE;  Surgeon: Jessy Oto, MD;  Location: [facility];  Service: Orthopedics;  Laterality: Right;    There were no vitals filed for this visit.  Subjective Assessment - 06/02/19 1051    Subjective  Patient reports her pain today is about a 7/10. She was doing cardio yesterday which stiffened her up    Pertinent History  ICH, seizure, ORIF right ankle 03/13/2012, BiPolar, HTN, Lipoma, obesity, DVT, COPD    Limitations  Standing;Walking;House hold activities    How long can you stand comfortably?  15 minutes    How long can you walk comfortably?  15-20 minutes but reports has to hold on to shopping cart with prolonged walking for shopping.    Diagnostic tests  X-rays    Patient Stated  Goals  "stand properly", learn how to self-manage symptoms    Currently in Pain?  Yes    Pain Score  7     Pain Location  Knee    Pain Orientation  Right    Pain Descriptors / Indicators  Aching    Pain Type  Chronic pain    Pain Onset  1 to 4 weeks ago    Pain Frequency  Constant    Aggravating Factors   sitting for a long time    Pain Relieving Factors  rest    Effect of Pain on Daily Activities  limits tolerance to activity                       [facility] Adult PT Treatment/Exercise - 06/02/19 0001      Lumbar Exercises: Stretches   Active Hamstring Stretch  2 reps;30 seconds    Active Hamstring Stretch Limitations  seated with cues for netrual spine    Quad Stretch Limitations  side lying with strap 3x30 sec hold     Gastroc Stretch  2 reps;30 seconds;Right;Left    Gastroc Stretch Limitations  with strap       Knee/Hip Exercises: Standing  Heel Raises Limitations  x20     Hip Flexion Limitations  standing march 2x10       Knee/Hip Exercises: Seated   Long Arc Quad  AROM;Right;20 reps    Long [employer] Limitations  with alt. ankle PF/DF for knee swelling    Other Seated Knee/Hip Exercises  30 reps hip abduction , green band around knees, feet supported.     Abd/Adduction Limitations  ball squueze x30       Knee/Hip Exercises: Supine   Quad Sets Limitations  2x10 5 sec hold bilateral seated              PT Education - 06/02/19 1052    Education Details  reviewed symptom mangement    Person(s) Educated  Patient    Methods  Explanation;Demonstration;Tactile cues;Verbal cues    Comprehension  Verbalized understanding;Verbal cues required;Tactile cues required       PT Short Term Goals - 06/02/19 1111      PT SHORT TERM GOAL #1   Title  Independent with basic HEP    Baseline  instructed today    Time  4    Period  Weeks    Status  On-going      PT SHORT TERM GOAL #2   Title  Increase right knee flexion AROM 5 deg or greater to improve ability to  navigate stairs and donn shoes    Baseline  100 deg    Time  4    Period  Weeks    Status  On-going    Target Date  06/10/19      PT SHORT TERM GOAL #3   Title  Increase right quadricep strength at least 1/2 MMT grade to improve knee stability for community ambulation and stair navigation    Baseline  4/5    Time  4    Period  Weeks    Status  On-going      PT SHORT TERM GOAL #4   Title  Patient will increase bilateral hip strength to 4+/5 gross     Baseline  working on with exercise    Time  4    Period  Weeks    Status  On-going    Target Date  04/01/18        PT Long Term Goals - 05/13/19 1744      PT LONG TERM GOAL #1   Title  Right knee strength 5/5 to improve knee stability for community ambulation including over uneven surfaces as well as stair navigation    Baseline  4/5    Time  8    Period  Weeks    Status  New    Target Date  07/08/19      PT LONG TERM GOAL #2   Title  Tolerate standing at least 30 min or greater for doing chores and cooking at home    Baseline  limited to 15 minutes "on a good day" per pt.    Time  8    Period  Weeks    Status  New    Target Date  07/08/19      PT LONG TERM GOAL #3   Title  Tolerate ambulation periods 30-40 min for grocery shopping without having to lean heavily on grocery cart with knee pain 3/10 or less    Baseline  limited to 15-20 minutes and reports reliance on leaning on cart    Time  8    Period  Weeks  Status  New    Target Date  06/10/19      PT LONG TERM GOAL #4   Title  Independent with advanced HEP for continued independent progression after d/c from formal therapy    Baseline  basic HEP at eval    Time  8    Period  Weeks    Status  New    Target Date  07/08/19            Plan - 06/02/19 1053    Clinical Impression Statement  Patient was limited today. She reported being sore after doing her water aerovbics. Patient was Lynnell Grain that doing her water aerobics beofre PT may be too much for  her. Her water aerobics at this point are probably more beneficial then the PT. We will re-assess the patient next visit. If she is comfortable with her program we may D/C to her aerobics program.    Personal Factors and Comorbidities  Comorbidity 3+    Comorbidities  obesity, COPD, anxiety, bipolar    Examination-Participation Restrictions  Cleaning;Community Activity;Shop    Stability/Clinical Decision Making  Evolving/Moderate complexity    Clinical Decision Making  Moderate    Rehab Potential  Fair    PT Duration  8 weeks    PT Treatment/Interventions  ADLs/Self Care Home Management;Cryotherapy;Electrical Stimulation;Therapeutic activities;Therapeutic exercise;Patient/family education;Neuromuscular re-education;Functional mobility training;Iontophoresis '4mg'$ /ml Dexamethasone;Passive range of motion;Vasopneumatic Device;Taping    PT Next Visit Plan  progress lower body strengthening.    PT Home Exercise Plan  LAQ, quad sets, SAQ, standing hip abduction, heel raises    Consulted and Agree with Plan of Care  Patient       Patient will benefit from skilled therapeutic intervention in order to improve the following deficits and impairments:  Difficulty walking, Decreased strength, Decreased activity tolerance, Decreased range of motion, Increased edema, Obesity, Pain, Impaired flexibility  Visit Diagnosis: 1. Acute pain of right knee   2. Primary osteoarthritis of right knee   3. Stiffness of right knee, not elsewhere classified   4. Difficulty in walking, not elsewhere classified     PHYSICAL THERAPY DISCHARGE SUMMARY  Visits from Start of Care: 3  Current functional level related to goals / functional outcomes: Unknown patient did not return    Remaining deficits: Unknown    Education / Equipment: HEP   Plan: Patient agrees to discharge.  Patient goals were not met. Patient is being discharged due to meeting the stated rehab goals.  ?????       Problem List Patient Active  Problem List   Diagnosis Date Noted  . Occasional tremors 05/12/2019  . COPD, moderate ([place]) 12/11/2018  . Influenza A 12/01/2018  . Notalgia 02/11/2018  . Anemia 02/11/2018  . Chronic right shoulder pain 01/21/2018  . Chronic bilateral low back pain with left-sided sciatica 12/14/2017  . History of stroke 08/13/2017  . Irregular menstrual cycle 08/13/2017  . Iron deficiency anemia 12/12/2016  . Seizure ([place])   . ICH (intracerebral hemorrhage) ([place]) 11/08/2016  . COPD exacerbation ([place]) 05/10/2016  . History of alcohol dependence ([place]) 01/11/2016  . Left leg DVT ([place]) 09/29/2013  . Pre-diabetes 03/02/2013  . History of pulmonary embolism 05/05/2012  . Painful menstrual periods 01/05/2012  . Bipolar disorder ([place]) 07/03/2010  . Former tobacco use 08/23/2009  . Hyperlipidemia 04/30/2007  . Class 3 severe obesity due to excess calories with serious comorbidity and body mass index (BMI) of 50.0 to 59.9 in adult ([place]) 01/01/2007  . HYPERTENSION, BENIGN SYSTEMIC  01/01/2007    Carney Living PT DPT   06/02/2019, 8:53 PM  [facility] [address] [place], [place], [address] Phone: [phone]   Fax:  [phone]  Name: Carla Hanson MRN: [id number] Date of Birth: [birth year]

## 2019-06-08 ENCOUNTER — Ambulatory Visit: Payer: Medicaid Other | Admitting: Physical Therapy

## 2019-06-21 ENCOUNTER — Ambulatory Visit: Payer: Medicaid Other | Attending: Sports Medicine | Admitting: Physical Therapy

## 2019-06-25 ENCOUNTER — Encounter: Payer: Self-pay | Admitting: Family Medicine

## 2019-06-25 ENCOUNTER — Telehealth: Payer: Self-pay | Admitting: *Deleted

## 2019-06-25 NOTE — Telephone Encounter (Signed)
Pt is requesting a letter to allow her to exercise @ [employer].  Due to covid pt needs a letter from the provider stating that "she would benefit from exercising".  To PCP\   Christen Bame, CMA

## 2019-06-25 NOTE — Telephone Encounter (Signed)
Letter faxed to the [facility] per patients request.

## 2019-06-25 NOTE — Progress Notes (Signed)
Letter for School/Work  Fleeger, Laban Emperor, CMA routed conversation to You 2 hours ago (9:39 AM)    Fleeger, Laban Emperor, CMA 2 hours ago (9:39 AM)     Pt is requesting a letter to allow her to exercise @ [employer].  Due to covid pt needs a letter from the provider stating that "she would benefit from exercising".  To PCP\   Christen Bame, [place]       Documentation     Van, [place] [id number]  Fleeger, Laban Emperor, CMA

## 2019-06-25 NOTE — Telephone Encounter (Signed)
Letter is on file. Please print for her whenever she comes in to pick up.

## 2019-07-19 ENCOUNTER — Other Ambulatory Visit: Payer: Self-pay | Admitting: Family Medicine

## 2019-07-26 DIAGNOSIS — Z23 Encounter for immunization: Secondary | ICD-10-CM | POA: Diagnosis not present

## 2019-08-02 ENCOUNTER — Encounter: Payer: Self-pay | Admitting: Family Medicine

## 2019-08-02 DIAGNOSIS — Z86718 Personal history of other venous thrombosis and embolism: Secondary | ICD-10-CM | POA: Insufficient documentation

## 2019-08-02 DIAGNOSIS — Z87898 Personal history of other specified conditions: Secondary | ICD-10-CM | POA: Insufficient documentation

## 2019-08-02 DIAGNOSIS — Z8679 Personal history of other diseases of the circulatory system: Secondary | ICD-10-CM | POA: Insufficient documentation

## 2019-08-03 ENCOUNTER — Ambulatory Visit: Payer: Medicaid Other | Admitting: Family Medicine

## 2019-08-27 ENCOUNTER — Encounter: Payer: Self-pay | Admitting: Family Medicine

## 2019-08-27 ENCOUNTER — Other Ambulatory Visit: Payer: Self-pay

## 2019-08-27 ENCOUNTER — Ambulatory Visit (INDEPENDENT_AMBULATORY_CARE_PROVIDER_SITE_OTHER): Payer: Medicaid Other | Admitting: Family Medicine

## 2019-08-27 VITALS — BP 138/82 | HR 88 | Temp 98.4°F | Ht 64.0 in | Wt 324.0 lb

## 2019-08-27 DIAGNOSIS — E785 Hyperlipidemia, unspecified: Secondary | ICD-10-CM | POA: Diagnosis not present

## 2019-08-27 DIAGNOSIS — J449 Chronic obstructive pulmonary disease, unspecified: Secondary | ICD-10-CM | POA: Diagnosis not present

## 2019-08-27 DIAGNOSIS — D649 Anemia, unspecified: Secondary | ICD-10-CM

## 2019-08-27 DIAGNOSIS — I10 Essential (primary) hypertension: Secondary | ICD-10-CM | POA: Diagnosis not present

## 2019-08-27 DIAGNOSIS — Z6841 Body Mass Index (BMI) 40.0 and over, adult: Secondary | ICD-10-CM | POA: Diagnosis not present

## 2019-08-27 DIAGNOSIS — R251 Tremor, unspecified: Secondary | ICD-10-CM

## 2019-08-27 DIAGNOSIS — R7309 Other abnormal glucose: Secondary | ICD-10-CM | POA: Diagnosis not present

## 2019-08-27 DIAGNOSIS — F3176 Bipolar disorder, in full remission, most recent episode depressed: Secondary | ICD-10-CM

## 2019-08-27 DIAGNOSIS — Z1231 Encounter for screening mammogram for malignant neoplasm of breast: Secondary | ICD-10-CM

## 2019-08-27 DIAGNOSIS — R7303 Prediabetes: Secondary | ICD-10-CM | POA: Diagnosis not present

## 2019-08-27 DIAGNOSIS — F1021 Alcohol dependence, in remission: Secondary | ICD-10-CM | POA: Diagnosis not present

## 2019-08-27 DIAGNOSIS — E66813 Obesity, class 3: Secondary | ICD-10-CM

## 2019-08-27 LAB — POCT GLYCOSYLATED HEMOGLOBIN (HGB A1C): HbA1c, POC (controlled diabetic range): 6 % (ref 0.0–7.0)

## 2019-08-27 NOTE — Assessment & Plan Note (Signed)
A1C worsened today. She will continue to work on diet and exercise for weight loss. I advised her that she might not become diabetic if she loses weight. She is motivated to do so.

## 2019-08-27 NOTE — Assessment & Plan Note (Signed)
BP okay on current regimen. Check Cmet today.

## 2019-08-27 NOTE — Assessment & Plan Note (Signed)
Doing well with abstinence.

## 2019-08-27 NOTE — Assessment & Plan Note (Signed)
Stable on current regimen   

## 2019-08-27 NOTE — Patient Instructions (Signed)

## 2019-08-27 NOTE — Progress Notes (Addendum)
Subjective:     Patient ID: Carla Hanson, female   DOB: 07/14/1966, 53 y.o.   MRN: [id number]  HPI Tremor:C/O generalized tremors intermittently since she had a stroke. She will have 1-3 episodes of generalized body shakes per week lasting 10-30 min duration. Last episode was this morning. No pain, no blurry vision, no headaches. She stays still and relax and she feels better.  Improves with relaxation. No LOC. Had neuro workup after her stroke. She denies Seizure activity. She feels okay now. HTN:   She is compliant with HCTZ 12.5 mg qd and Losartan 50 mg qd. COPD:Compliant with her Dulera. She has not needed to use her albuterol as often. Denies SOB, no chest pain or chest tightness. HLD: She is not on meds.  Bipolar:Denies any symptoms for more than 3 years. She is not on meds and not seeing a Psych. Obese: She started going to the Gym since 3 months. She goes about 3-4 times per week and she does water aerobics, walk and lift weight. Her most recently checked weight at home was 336 lbs.  ETOH:Stopped drinking more than 3 yrs ago. Pre-DM: Here for f/u.  Current Outpatient Medications on File Prior to Visit  Medication Sig Dispense Refill  . aspirin EC 81 MG EC tablet Take 1 tablet (81 mg total) by mouth daily. 30 tablet 12  . ferrous sulfate 325 (65 FE) MG tablet Take 1 tablet (325 mg total) by mouth 2 (two) times daily with a meal. 60 tablet 2  . hydrochlorothiazide (MICROZIDE) 12.5 MG capsule Take 1 capsule (12.5 mg total) by mouth daily. 90 capsule 2  . losartan (COZAAR) 50 MG tablet Take 1 tablet (50 mg total) by mouth daily. 90 tablet 2  . mometasone-formoterol (DULERA) 100-5 MCG/ACT AERO Inhale 2 puffs into the lungs 2 times daily at 12 noon and 4 pm. 8.8 g 3  . acetaminophen (TYLENOL) 325 MG tablet Take 650 mg by mouth every 6 (six) hours as needed for mild pain.    Marland Kitchen albuterol (VENTOLIN HFA) 108 (90 Base) MCG/ACT inhaler INHALE 2 PUFFS INTO THE LUNGS EVERY 4 HOURS AS NEEDED FOR  WHEEZING OR COUGH (Patient not taking: Reported on 08/27/2019) 13.4 g 6   No current facility-administered medications on file prior to visit.    Past Medical History:  Diagnosis Date  . Anxiety   . Bipolar disorder ([place])   . COPD exacerbation ([place]) 05/10/2016  . Hyperlipidemia   . Hypertension   . Influenza A 12/01/2018  . Left leg DVT ([place]) 09/29/2013  . Lipoma    Abdomen  . LIPOMA 01/20/2008  . Obesity   . Painful menstrual periods 01/05/2012  . Pneumonia   . Seizure ([place])   . Tobacco abuse      Review of Systems  Respiratory: Negative.   Cardiovascular: Negative.   Gastrointestinal: Negative.   Musculoskeletal: Negative.   Psychiatric/Behavioral: Negative for agitation, behavioral problems, confusion, hallucinations and self-injury. The patient is not nervous/anxious.   All other systems reviewed and are negative.      Vitals:   08/27/19 0951  BP: 138/82  Pulse: 88  Temp: 98.4 F (36.9 C)  TempSrc: Oral  SpO2: 98%  Weight: (!) 324 lb (147 kg)  Height: 5\' 4"  (1.626 m)    Objective:   Physical Exam Vitals signs and nursing note reviewed.  HENT:     Head: Normocephalic.  Cardiovascular:     Rate and Rhythm: Normal rate and regular rhythm.  Heart sounds: Normal heart sounds. No murmur.  Pulmonary:     Effort: Pulmonary effort is normal. No respiratory distress.     Breath sounds: Normal breath sounds. No stridor. No wheezing or rhonchi.  Abdominal:     General: Abdomen is flat. There is no distension.     Palpations: Abdomen is soft. There is no mass.     Tenderness: There is no abdominal tenderness.  Musculoskeletal:     Right lower leg: No edema.     Left lower leg: No edema.  Neurological:     General: No focal deficit present.     Mental Status: She is alert and oriented to person, place, and time.     Cranial Nerves: No cranial nerve deficit.     Sensory: No sensory deficit.     Motor: Tremor present. No weakness.     Coordination: Coordination  normal.     Gait: Gait normal.     Comments: Fine hand tremor+ with outstretched hands and eyes closed.  Psychiatric:        Mood and Affect: Mood normal.        Behavior: Behavior normal.        Judgment: Judgment normal.          Office Visit from 08/27/2019 in [facility]  PHQ-9 Total Score  0     GAD 7 : Generalized Anxiety Score 08/27/2019 03/20/2018  Nervous, Anxious, on Edge 0 0  Control/stop worrying 0 0  Worry too much - different things 0 0  Trouble relaxing 0 0  Restless 0 0  Easily annoyed or irritable 0 0  Afraid - awful might happen 0 0  Total GAD 7 Score 0 0  Anxiety Difficulty - Not difficult at all     Assessment:     Tremors HTN COPD HLD Bipolar ETOH abuse PreDM Morbid obesity    Plan:     Check problem list.  Mammogram slip given and order placed for her yearly screening.

## 2019-08-27 NOTE — Assessment & Plan Note (Addendum)
She declined starting Statin. She will work on losing weight. FLP check in Nov/Dec.

## 2019-08-27 NOTE — Assessment & Plan Note (Signed)
Stable off meds. ?

## 2019-08-27 NOTE — Assessment & Plan Note (Signed)
I discussed the various comorbid conditions associated with morbid obesity including prediabetes and HTN. HShe is interested in losing weight.  I recommended a reduction in caloric intake, advised regarding diet, snacking and exercise.  She declined referral to Nutrition Services.  She will follow-up in 2-3 month(s) for reassessment and weight management.

## 2019-08-27 NOTE — Assessment & Plan Note (Signed)
Etiology unclear. Differentials include, anxiety, essential tremors, parkinsonism. Given the circumstances as described by the patient, it sounds more anxiety/panic related. GAD7 = 0. Lab work completed today to r/o endocrine pathology. Parkinsonism is less likely. However, since symptoms started post-stroke, I will refer her to neurologist for reassessment. If cleared by neuro, I will treat for essential tremor and panic. She agreed with the plan.

## 2019-08-28 LAB — CMP14+EGFR
ALT: 17 IU/L (ref 0–32)
AST: 16 IU/L (ref 0–40)
Albumin/Globulin Ratio: 1.4 (ref 1.2–2.2)
Albumin: 4.3 g/dL (ref 3.8–4.9)
Alkaline Phosphatase: 51 IU/L (ref 39–117)
BUN/Creatinine Ratio: 23 (ref 9–23)
BUN: 14 mg/dL (ref 6–24)
Bilirubin Total: 0.2 mg/dL (ref 0.0–1.2)
CO2: 23 mmol/L (ref 20–29)
Calcium: 10.2 mg/dL (ref 8.7–10.2)
Chloride: 100 mmol/L (ref 96–106)
Creatinine, Ser: 0.61 mg/dL (ref 0.57–1.00)
GFR calc Af Amer: 121 mL/min/{1.73_m2} (ref >59)
GFR calc non Af Amer: 105 mL/min/{1.73_m2} (ref >59)
Globulin, Total: 3.1 g/dL (ref 1.5–4.5)
Glucose: 107 mg/dL — ABNORMAL HIGH (ref 65–99)
Potassium: 4 mmol/L (ref 3.5–5.2)
Sodium: 140 mmol/L (ref 134–144)
Total Protein: 7.4 g/dL (ref 6.0–8.5)

## 2019-08-28 LAB — CBC WITH DIFFERENTIAL/PLATELET
Basophils Absolute: 0.1 10*3/uL (ref 0.0–0.2)
Basos: 1 %
EOS (ABSOLUTE): 0.2 10*3/uL (ref 0.0–0.4)
Eos: 3 %
Hematocrit: 38.6 % (ref 34.0–46.6)
Hemoglobin: 12.5 g/dL (ref 11.1–15.9)
Immature Grans (Abs): 0 10*3/uL (ref 0.0–0.1)
Immature Granulocytes: 0 %
Lymphocytes Absolute: 2.5 10*3/uL (ref 0.7–3.1)
Lymphs: 29 %
MCH: 25.7 pg — ABNORMAL LOW (ref 26.6–33.0)
MCHC: 32.4 g/dL (ref 31.5–35.7)
MCV: 79 fL (ref 79–97)
Monocytes Absolute: 0.4 10*3/uL (ref 0.1–0.9)
Monocytes: 4 %
Neutrophils Absolute: 5.4 10*3/uL (ref 1.4–7.0)
Neutrophils: 63 %
Platelets: 402 10*3/uL (ref 150–450)
RBC: 4.86 x10E6/uL (ref 3.77–5.28)
RDW: 14.2 % (ref 11.7–15.4)
WBC: 8.5 10*3/uL (ref 3.4–10.8)

## 2019-08-28 LAB — TSH: TSH: 1.39 u[IU]/mL (ref 0.450–4.500)

## 2019-08-29 NOTE — Addendum Note (Signed)
Addended by: Andrena Mews T on: 08/29/2019 11:15 AM   Modules accepted: Orders

## 2019-08-30 ENCOUNTER — Telehealth: Payer: Self-pay | Admitting: *Deleted

## 2019-08-30 NOTE — Telephone Encounter (Signed)
LM for patient ok per DPR.  Labs were within normal limits and asked her to call back to schedule her follow up appt with PCP.  Verdis Bassette,CMA

## 2019-08-30 NOTE — Telephone Encounter (Signed)
-----   Message from Kinnie Feil, MD sent at 08/29/2019 11:09 AM EDT ----- Please advise this patient that her test results looks fine. I have placed referral to the neurologist for her tremors. F/U with me in the next 2-3 months as planned.

## 2019-08-31 ENCOUNTER — Other Ambulatory Visit: Payer: Self-pay | Admitting: *Deleted

## 2019-08-31 DIAGNOSIS — I10 Essential (primary) hypertension: Secondary | ICD-10-CM

## 2019-08-31 DIAGNOSIS — J449 Chronic obstructive pulmonary disease, unspecified: Secondary | ICD-10-CM

## 2019-09-01 MED ORDER — LOSARTAN POTASSIUM 50 MG PO TABS: 50.0000 mg | ORAL_TABLET | Freq: Every day | ORAL | 2 refills | Status: DC

## 2019-09-01 MED ORDER — HYDROCHLOROTHIAZIDE 12.5 MG PO CAPS: 12.5000 mg | ORAL_CAPSULE | Freq: Every day | ORAL | 2 refills | Status: DC

## 2019-09-01 MED ORDER — MOMETASONE FURO-FORMOTEROL FUM 100-5 MCG/ACT IN AERO: 2.0000 | INHALATION_SPRAY | Freq: Two times a day (BID) | RESPIRATORY_TRACT | 3 refills | Status: DC

## 2019-09-02 ENCOUNTER — Telehealth: Payer: Self-pay

## 2019-09-02 ENCOUNTER — Other Ambulatory Visit: Payer: Self-pay | Admitting: Family Medicine

## 2019-09-02 DIAGNOSIS — R7303 Prediabetes: Secondary | ICD-10-CM

## 2019-09-02 NOTE — Telephone Encounter (Signed)
Pt LVM on nurse line asking for Dr. Gwendlyn Deutscher to give her a call. Pt has some questions about the blood work results. Ottis Stain, CMA

## 2019-09-02 NOTE — Progress Notes (Signed)
Wanted to discuss diet to improve her A1C. Brief counseling done to the best of my knowledge. I again recommended discussing with the nutritionist. She declined referral previously but will like to Talk to Dr. Jenne Campus now. She has Dr. Jenne Campus number and I have entered a referral for her.

## 2019-09-02 NOTE — Telephone Encounter (Signed)
Wanted to discuss diet to improve her A1C. Brief counseling done to the best of my knowledge. I again recommended discussing with the nutritionist. She declined referral previously but will like to Talk to Dr. Jenne Campus now. She has Dr. Jenne Campus number and I have entered a referral for her.

## 2019-09-21 ENCOUNTER — Ambulatory Visit (INDEPENDENT_AMBULATORY_CARE_PROVIDER_SITE_OTHER): Payer: Medicaid Other | Admitting: Family Medicine

## 2019-09-21 ENCOUNTER — Other Ambulatory Visit: Payer: Self-pay

## 2019-09-21 DIAGNOSIS — E109 Type 1 diabetes mellitus without complications: Secondary | ICD-10-CM | POA: Diagnosis not present

## 2019-09-21 DIAGNOSIS — Z6841 Body Mass Index (BMI) 40.0 and over, adult: Secondary | ICD-10-CM | POA: Diagnosis not present

## 2019-09-21 DIAGNOSIS — R7303 Prediabetes: Secondary | ICD-10-CM

## 2019-09-21 NOTE — Progress Notes (Signed)
Medical Nutrition Therapy Telehealth Encounter I connected with Carla Hanson (MRN [id number]) on 09/21/2019 by MyChart video-enabled, HIPAA-compliant telemedicine application, verified that I am speaking with the correct person using two identifiers, and that the patient was in a private environment conducive to confidentiality.  The patient agreed to proceed.  Provider was Kennith Center, PhD, RD, LDN, CEDRD Provider(s) located at [facility] during this telehealth encounter; patient was at home  Appt start time: 1000 end time: 1100 (1 hour)  Reason for telehealth visit: She was referred for MNT by Dr. Gwendlyn Deutscher for Medical Nutrition Therapy for weight management and blood sugar control.    Relevant history/background:  Carla Hanson's recent A1C was 6.0.  Carla Hanson said her weight has increased progressively during the pandemic, but she is now highly motivated to improve both weight and BG control.    Assessment:   Carla Hanson has made dietary changes and started exercising about 4 months ago.  She has lost weight from 336 to 318 lb (weighing at home).   Usual eating pattern: 2-3 meals and 0-1 snack per day. Frequent foods and beverages: water; eggs, chx, [place], veg's, fruit, especially apples.   Avoided foods: most bread, sugar, restaurant foods.   Usual physical activity: 3 hrs at [facility] 3 X wk: 60 min water exercise, 30-45 min TM waling and 15-20 min weights, and low-intensity aerobics class.  Walks 15-30 min 1-2 X wk.   Sleep: Estimates she gets 6-8 hours/night.  Lab Results  Component Value Date   HGBA1C 6.0 08/27/2019   24-hr recall: (Up at 5:45 AM) B (6:45 AM)-   1 apple, water  - went to gym for 3 hours -  Snk ( AM)-    L (2 PM)-  2 chx wings (air-fried), 1 avocado, 1 c Healthy Balance apple juice (6 g CHO) Snk (2 PM)-  --- D ( PM)-  --- Snk (10 PM)-  2 tbsp cashews  Typical day? Yes.  Some days she gets more, e.g., wraps with [place], chs, tomato.  Sometimes bkfst is  eggs.    Handouts given during visit include:  After-Visit Summary (AVS)  Intervention: Completed diet history, and established behavioral goals.    For recommendations and goals, see Patient Instructions.    Follow-up: Telehealth visit in 4 week(s).

## 2019-09-21 NOTE — Patient Instructions (Addendum)
  Diet Recommendations for blood glucose management: Carbohydrate includes starch, sugar, and fiber.  Of these, only sugar and starch raise blood glucose.  (Fiber is found in fruits, vegetables [especially skin, seeds, and stalks] and whole grains.)   Starchy (carb) foods: Bread, rice, pasta, potatoes, corn, cereal, grits, crackers, bagels, muffins, all baked goods.  (Fruit, milk, and yogurt also have carbohydrate, but most of these foods will not spike your blood sugar as most starchy foods will.)  A few fruits do cause high blood sugars; use small portions of bananas (limit to 1/2 at a time), grapes, watermelon, oranges, and most tropical fruits.   Protein foods: Meat, fish, poultry, eggs, dairy foods, and beans such as pinto and kidney beans (beans also provide carbohydrate).   1. Eat at least REAL 3 meals and 1 snacks per day. Have something to eat at least every 5 hours while awake.  Eat breakfast within the first hour of getting up.   2. Limit starchy foods to  ONE toTWO per meal and ONE per snack. ONE portion of a starchy food is equal to the following:   - ONE slice of bread (or its equivalent, such as half of a hamburger bun).   - 1/2 cup of a "scoopable" starchy food such as potatoes or rice.   - 15 grams of Total Carbohydrate as shown on food label.   - Every 4 ounces of a sweet drink (including fruit juice). 3. Include at every meal: a protein food, a carb food, and vegetables and/or fruit.   - Obtain twice the volume of veg's as protein or carbohydrate foods for both lunch and dinner.   - Fresh or frozen veg's are best.   - Keep frozen veg's on hand for a quick vegetable serving.      Suggestions - By not eating both lunch and dinner, you are not getting enough vegetables.  All vegetables and fruits are good to excellent sources of potassium, very important blood pressure control.   - If you use canned soup, get in the habit of first microwaving vegetables (fresh or frozen), add the  soup, and re-heat.   - Try mixing plain and sweetened yogurt.    Follow-up appt: Monday, Dec 14 at 11 AM via [email or web address]

## 2019-09-23 ENCOUNTER — Other Ambulatory Visit: Payer: Self-pay | Admitting: Family Medicine

## 2019-09-23 DIAGNOSIS — J449 Chronic obstructive pulmonary disease, unspecified: Secondary | ICD-10-CM

## 2019-10-18 ENCOUNTER — Telehealth: Payer: Medicaid Other | Admitting: Family Medicine

## 2019-10-20 ENCOUNTER — Ambulatory Visit: Payer: Medicaid Other

## 2019-11-11 ENCOUNTER — Ambulatory Visit (INDEPENDENT_AMBULATORY_CARE_PROVIDER_SITE_OTHER): Payer: Medicaid Other | Admitting: Family Medicine

## 2019-11-11 ENCOUNTER — Other Ambulatory Visit: Payer: Self-pay

## 2019-11-11 DIAGNOSIS — Z6841 Body Mass Index (BMI) 40.0 and over, adult: Secondary | ICD-10-CM | POA: Diagnosis not present

## 2019-11-11 DIAGNOSIS — R7303 Prediabetes: Secondary | ICD-10-CM | POA: Diagnosis not present

## 2019-11-11 NOTE — Patient Instructions (Addendum)
-   Reminder: ALL sweet drinks are highly effective at raising blood glucose.  Eat your fruit; don't drink it!  - Suggestion: Taste preferences are learned.  This means you can get to the point of preferring your beverages less sweet.  A way to help you make progress toward preferring less sweet foods/drinks is to decrease the amount of sweets or the degree of sweetness in those you have.  Try diluting juice with club soda.  Also, I recommend that you decrease the amount of honey in your coffee by just a small amount, but to do this each time you have coffee.  The key is to be consistent in these new behaviors.    - Think about our discussion about small habits over time:  Count up the number of drinks and foods you normally have in a week that are sources of sweet taste.  This needs to include artificially sweetened  foods and drinks too.  Consider which of those foods or drinks you can modify to be less sweet (like above examples), and establish that as a new habit.  Email Jeannie what you have decided to modify.    Goals (same as last appt):  1. Eat at least 3 REAL meals and 1-2 snacks per day.  Eat breakfast within one hour of getting up, and aim for no more than 5 hours between eating.  A REAL meal includes at least some protein, some starch, and vegetables and/or fruit.    2. Limit starchy foods to ONE to TWO per meal and ONE per snack. ONE portion of a starchy food is equal to the following:              - ONE slice of bread (or its equivalent, such as half of a hamburger bun).              - 1/2 cup of a "scoopable" starchy food such as potatoes or rice.              - 15 grams of Total Carbohydrate as shown on food label.              - Every 4 ounces of a sweet drink (including fruit juice).  Follow-up appt on Thursday, Feb 4 at 2:20 PM via [email or web address]

## 2019-11-11 NOTE — Progress Notes (Signed)
Medical Nutrition Therapy Telehealth Encounter I connected with JAHMIAH PEEK (MRN [id number]) on 11/11/2019 by MyChart video-enabled, HIPAA-compliant telemedicine application, verified that I am speaking with the correct person using two identifiers, and that the patient was in a private environment conducive to confidentiality.  The patient agreed to proceed.  Provider was Kennith Center, PhD, RD, LDN, CEDRD Provider(s) located at home during this telehealth encounter; patient was at home  Appt start time: 1000 end time: 1100 (1 hour)  Reason for telehealth visit: She was referred for MNT by Dr. Gwendlyn Deutscher for Medical Nutrition Therapy for weight management and blood sugar control.    Relevant history/background:  Charmian's recent A1C was 6.0.  Tela said her weight has increased progressively during the pandemic, but she is now highly motivated to improve both weight and BG control.    Assessment:   Mikah felt she did well through the holidays, although she still struggles with resisting sweets and with following a consistent eating schedule.  Has not weighed herself recently.   Recent eating pattern: 2-3 meals and 0-1 snack per day (was doing 3 meals until sinusitis.) Recent physical activity: Recovering from a sinus infection, but prior to the past week, had been continuing her 3 hrs at [facility] 3 X wk: 60 min water exercise, 30-45 min TM waling and 15-20 min weights, and low-intensity aerobics class.  Walks 15-30 min 1-2 X wk.    Lab Results  Component Value Date   HGBA1C 6.0 08/27/2019   24-hr recall:  (Up at 7 AM) B ( AM)-  water  Snk (10:30)-  1 steak biscuit, 110 oz o.j. L (3 PM)-  3 chx wings,1 1/2 c grn beans&corn, 1 tsp butter, 6 oz pink lemonade.  Snk (5 PM)-  12 oz coffee, 1 tbsp cream, 1 tbsp honey D ( PM)-   Snk (8 PM)-  1 avocado Typical day? No.  Appetite has been off since getting sick.   Handouts given during visit include:  After-Visit Summary (AVS)  Intervention:  Reviewed recommendations from last appt re. limiting carb portions at meals.  Also discussed better glycemic control through decreased intake of sweet drinks, and ways to decrease taste preference for sweetness.    For recommendations and goals, see Patient Instructions.    Follow-up: Telehealth visit in 4 week(s).

## 2019-11-16 ENCOUNTER — Other Ambulatory Visit: Payer: Self-pay | Admitting: *Deleted

## 2019-11-16 DIAGNOSIS — I10 Essential (primary) hypertension: Secondary | ICD-10-CM

## 2019-11-16 MED ORDER — LOSARTAN POTASSIUM 50 MG PO TABS: 50.0000 mg | ORAL_TABLET | Freq: Every day | ORAL | 2 refills | Status: DC

## 2019-12-03 ENCOUNTER — Other Ambulatory Visit: Payer: Self-pay

## 2019-12-03 ENCOUNTER — Telehealth (INDEPENDENT_AMBULATORY_CARE_PROVIDER_SITE_OTHER): Payer: Medicaid Other | Admitting: Family Medicine

## 2019-12-03 DIAGNOSIS — R002 Palpitations: Secondary | ICD-10-CM | POA: Diagnosis not present

## 2019-12-03 DIAGNOSIS — Z20828 Contact with and (suspected) exposure to other viral communicable diseases: Secondary | ICD-10-CM | POA: Diagnosis not present

## 2019-12-03 NOTE — Progress Notes (Signed)
[facility] Telemedicine Visit  Patient consented to have virtual visit. Method of visit: Video was attempted, but technology challenges prevented patient from using video, so visit was conducted via telephone.  Encounter participants: Patient: Carla Hanson - located at Home Provider: Danna Hefty - located at [facility] Others (if applicable): None  Chief Complaint: Heart palpitations, SOB  HPI: Patient notes she gets heart palpations and SOB that dont always occur at the same time. She notes occasional tightness in her chest. Denies any sweating, chest pain, jaw pain, numbness/tingling, or weakness. She notes she went this morning to get a COVID test, although she isnt having any other COVID symptoms. She was starting to feel very anxious and just felt she needed to get tested. She has a history of being very anxious, particularly about her health. She notes when she walks she gets SOB. She notes the palpitations come on when she eats food that is high in carbs. It was happening on a regular basis but now it isnt as regular. Denies any other symptoms at the time of heart palpitations. She notes it occurs now 1-2x/day. Denies chest pain with exertion.    ROS: per HPI  Pertinent PMHx: HTN, h/o DVT and PE, h/o hemorraghic Stroke, Pre-Diabetes , HLD  Exam:  Gen: very pleasant older lady, laughing and enjoying conversation Respiratory: Speaking in full sentences, breathing comfortably  Assessment/Plan:  Heart palpitations Acute and new onset for patient. Intermittent and not associated with other symptoms. Does endorse occasional chest tightness without pain or other anginal symptoms and SOB that occurs with exertion. She does note that these symptoms are improving. Most concerning differential includes paroxysmal atrial fibrillation vs paroxysmal SVT. Other differentials include anxiety vs angina, less likely CHF, hypercortisolism. TSH normal in Oct. 2020. Most recent  CBC in Oct. 2020 with normal Hgb. Given her risk factors and PMH, believe in-person evaluation is warranted. Would recommend vitals, EKG, heart auscultation at that time. Consider referral to cards for Holter monitor to evaluate for arrythmia. Can consider CBC to evaluate anemia and cortisol level (although very low on differential) for pheochromocytoma if initial work up unremarkable. Follow up scheduled for 12/08/19. Patient requested female provider. PCP not available. Strict return precautions discussed. Patient voiced understanding and agreement to plan.   Time spent during visit with patient: 21 minutes  Carla Hanson, [place], PGY2 12/03/19

## 2019-12-03 NOTE — Assessment & Plan Note (Addendum)
Acute and new onset for patient. Intermittent and not associated with other symptoms. Does endorse occasional chest tightness without pain or other anginal symptoms and SOB that occurs with exertion. She does note that these symptoms are improving. Most concerning differential includes paroxysmal atrial fibrillation vs paroxysmal SVT. Other differentials include anxiety vs angina, less likely CHF, hypercortisolism. TSH normal in Oct. 2020. Most recent CBC in Oct. 2020 with normal Hgb. Given her risk factors and PMH, believe in-person evaluation is warranted. Would recommend vitals, EKG, heart auscultation at that time. Consider referral to cards for Holter monitor to evaluate for arrythmia. Can consider CBC to evaluate anemia and cortisol level (although very low on differential) for pheochromocytoma if initial work up unremarkable. Follow up scheduled for 12/08/19. Patient requested female provider. PCP not available. Strict return precautions discussed. Patient voiced understanding and agreement to plan.

## 2019-12-06 ENCOUNTER — Telehealth: Payer: Self-pay

## 2019-12-06 NOTE — Telephone Encounter (Signed)
Pt called back and informed of below.   Talbot Grumbling, RN

## 2019-12-06 NOTE — Telephone Encounter (Signed)
It will likely not be covered by her insurance. \Not only is she pre-DM, she is also not on insulin treatment.  Please inform her.  If she is willing to pay out of pocket, she can buy it OTC from her pharmacy. However, she does not need to check her CBG since she does not have a DM and is not on meds for DM.

## 2019-12-06 NOTE — Telephone Encounter (Signed)
Pt calls nurse line requesting rx for blood glucose monitoring. According to office visit notes, patient is in pre-diabetic range, unsure if insurance will cover due to pre-diabetic diagnosis.   Please advise  To PCP  Talbot Grumbling, RN

## 2019-12-08 ENCOUNTER — Encounter: Payer: Self-pay | Admitting: Family Medicine

## 2019-12-08 ENCOUNTER — Other Ambulatory Visit: Payer: Self-pay

## 2019-12-08 ENCOUNTER — Ambulatory Visit (HOSPITAL_COMMUNITY)
Admission: RE | Admit: 2019-12-08 | Discharge: 2019-12-08 | Disposition: A | Discharge status: Home / Self Care | Payer: Medicaid Other | Source: Ambulatory Visit | Attending: Family Medicine | Admitting: Family Medicine

## 2019-12-08 ENCOUNTER — Ambulatory Visit (INDEPENDENT_AMBULATORY_CARE_PROVIDER_SITE_OTHER): Payer: Medicaid Other | Admitting: Family Medicine

## 2019-12-08 ENCOUNTER — Telehealth: Payer: Self-pay | Admitting: *Deleted

## 2019-12-08 VITALS — BP 128/82 | HR 76 | Ht 64.0 in | Wt 322.4 lb

## 2019-12-08 DIAGNOSIS — R002 Palpitations: Secondary | ICD-10-CM | POA: Diagnosis not present

## 2019-12-08 DIAGNOSIS — I440 Atrioventricular block, first degree: Secondary | ICD-10-CM | POA: Insufficient documentation

## 2019-12-08 NOTE — Patient Instructions (Addendum)
It was nice meeting you today Ms. Knick!  I am ordering a Holter monitor today to measure your heart rhythm for 48 hours.  You should get a call with more instructions about how to pick this up and drop it off.  I am also giving some labs today to check your blood count and electrolytes to make sure these are not contributing.  I am reassured that your heart palpitations are very short and are improving.  I will call you with the results of your Holter monitor.  If you have any questions or concerns, please feel free to call the clinic.   Be well,  Dr. Shan Levans

## 2019-12-08 NOTE — Telephone Encounter (Addendum)
3 day ZIO XT long term holter monitor to be mailed to the patients home.  Instructions reviewed briefly as they are included in the monitor kit. 

## 2019-12-08 NOTE — Progress Notes (Signed)
   CHIEF COMPLAINT / HPI:  Follow-up heart palpitations Occurs at most twice per day Worse when eating something sugary Is working with a nutritionist to cut down but still drinks sugary drinks Will feel two or three beats, palpitations are very short Denies chest pain although can feel tight No significant shortness of breath Anxiety is noticeable but mild Thinks wearing a mask makes it different for her to catch her breath Feels like palpitations are better Is taking aspirin regimen but is also taking Fe and Vitamin B No leg swelling  PERTINENT  PMH / PSH: hypertension, COPD, bipolar disorder, CVA, PE   OBJECTIVE: BP 128/82   Pulse 76   Ht 5\' 4"  (1.626 m)   Wt (!) 322 lb 6.4 oz (146.2 kg)   SpO2 98%   BMI 55.34 kg/m   General: Well-appearing, appears stated age CV: RRR, no MRG Respiratory: Normal work of breathing on room air, CTAB Psych: Appropriate mood and affect  GAD 7 : Generalized Anxiety Score 12/08/2019 08/27/2019 03/20/2018  Nervous, Anxious, on Edge 1 0 0  Control/stop worrying 0 0 0  Worry too much - different things 0 0 0  Trouble relaxing 1 0 0  Restless 1 0 0  Easily annoyed or irritable 0 0 0  Afraid - awful might happen 0 0 0  Total GAD 7 Score 3 0 0  Anxiety Difficulty Not difficult at all - Not difficult at all     ASSESSMENT / PLAN:  Heart palpitations Appears to be acute on chronic.  No red flag symptoms of chest pain or shortness of breath today.  Will obtain CBC and BMP.  EKG was performed which showed normal sinus rhythm.  Will order 48-hour Holter monitor to assess for rhythm at home.  Patient was congratulated on changing her lifestyle and encouraged to keep up with these efforts.     Kathrene Alu, MD [place]

## 2019-12-08 NOTE — Assessment & Plan Note (Signed)
Appears to be acute on chronic.  No red flag symptoms of chest pain or shortness of breath today.  Will obtain CBC and BMP.  EKG was performed which showed normal sinus rhythm.  Will order 48-hour Holter monitor to assess for rhythm at home.  Patient was congratulated on changing her lifestyle and encouraged to keep up with these efforts.

## 2019-12-09 ENCOUNTER — Telehealth: Payer: Self-pay

## 2019-12-09 ENCOUNTER — Ambulatory Visit (INDEPENDENT_AMBULATORY_CARE_PROVIDER_SITE_OTHER): Payer: Medicaid Other | Admitting: Family Medicine

## 2019-12-09 DIAGNOSIS — R7303 Prediabetes: Secondary | ICD-10-CM | POA: Diagnosis not present

## 2019-12-09 LAB — CBC
Hematocrit: 39.7 % (ref 34.0–46.6)
Hemoglobin: 12 g/dL (ref 11.1–15.9)
MCH: 24 pg — ABNORMAL LOW (ref 26.6–33.0)
MCHC: 30.2 g/dL — ABNORMAL LOW (ref 31.5–35.7)
MCV: 79 fL (ref 79–97)
Platelets: 391 10*3/uL (ref 150–450)
RBC: 5 x10E6/uL (ref 3.77–5.28)
RDW: 15.3 % (ref 11.7–15.4)
WBC: 9.8 10*3/uL (ref 3.4–10.8)

## 2019-12-09 LAB — BASIC METABOLIC PANEL
BUN/Creatinine Ratio: 16 (ref 9–23)
BUN: 8 mg/dL (ref 6–24)
CO2: 25 mmol/L (ref 20–29)
Calcium: 9.6 mg/dL (ref 8.7–10.2)
Chloride: 101 mmol/L (ref 96–106)
Creatinine, Ser: 0.51 mg/dL — ABNORMAL LOW (ref 0.57–1.00)
GFR calc Af Amer: 127 mL/min/{1.73_m2} (ref >59)
GFR calc non Af Amer: 110 mL/min/{1.73_m2} (ref >59)
Glucose: 96 mg/dL (ref 65–99)
Potassium: 3.9 mmol/L (ref 3.5–5.2)
Sodium: 139 mmol/L (ref 134–144)

## 2019-12-09 NOTE — Telephone Encounter (Signed)
-----   Message from Kathrene Alu, MD sent at 12/09/2019  1:36 PM EST ----- Would you let Ms. Reichl know that her CBC and BMP are normal?  Thank you!

## 2019-12-09 NOTE — Telephone Encounter (Signed)
Patient informed. Carla Hanson, CMA  

## 2019-12-09 NOTE — Progress Notes (Signed)
Medical Nutrition Therapy Telehealth Encounter I connected with Carla Hanson (MRN [id number]) on 12/09/2019 by MyChart video-enabled, HIPAA-compliant telemedicine application, verified that I am speaking with the correct person using two identifiers, and that the patient was in a private environment conducive to confidentiality.  The patient agreed to proceed.  Provider was Kennith Center, PhD, RD, LDN, CEDRD Provider(s) located at home during this telehealth encounter; patient was at home  Appt start time: 1430 end time: 1530 (1 hour)  Reason for telehealth visit: She was referred for MNT by Dr. Gwendlyn Deutscher for Medical Nutrition Therapy for weight management and blood sugar control.    Relevant history/background:  Sokhna's recent A1C was 6.0.  [name] said her weight has increased progressively during the pandemic, but she is now highly motivated to improve both weight and BG control.    Assessment:   [name] was seen at [facility] yesterday for heart palpitations.  Will complete Holter monitor data, and f/u with Dr. Gwendlyn Deutscher.  She recently got a glucometer, and tried out for the first time today.  Also purchased a BP cuff to monitor BP at home.   Has been drinking fruit juice only ~2 X wk now, and has been diluting it with club soda.   Is  usually eating [place] fries 3 X wk, and has often been drinking a smoothie at night instead of a real meal.   Recent eating pattern: 3 meals and 0-1 snack per day.   Recent usual physical activity: 25-minute YouTube workout at least 4 X wk.  Walks occasionally as well.   24-hr recall:  (Up at 6 AM) B ( AM)-  --- Snk ( AM)-   L (11:30 AM)-  1 grilled chx, let, tom, 1 c fries, 20 oz lemonade Snk ( PM)-  Water D (5 PM)-  1 8" tuna sub w/ cuc, chs, let, water Snk (8 PM)-  Smoothie (1/2 banana,4 strawber's 1/2 c blueber's, 1/2 avocado, 1 scoop pro powder, 1 c 2% milk) Typical day? No.  [place] appt early morning, so missed bkfst (which is usually just applesauce or fruit), and got  takeout for lunch.   Intervention: Reviewed recent diet and exercise hx, and established some new goals as well as means of documenting progress.    For recommendations and goals, see Patient Instructions.    Follow-up: Telehealth visit in 4 week(s).

## 2019-12-09 NOTE — Patient Instructions (Addendum)
-   Consider trying some flavored (non-sweetened)  club soda without juice.      - Check your blood glucose first thing in the morning before you eat anything, and record it in your logbook, along with your blood pressure.    Goals: 1. Limit starchy foods toONE to TWO per [place] ONE per snack. ONE portion of a starchy food is equal to the following:  - ONE slice of bread (or its equivalent, such as half of a hamburger bun).  - 1/2 cup of a "scoopable" starchy food such as potatoes or rice.  - 15 grams of Total Carbohydrate as shown on food label.  - Every 4 oz of a sweet drink (including fruit juice). 2. Use your morning time after bible study to plan ahead for meals and snacks, especially for days like yesterday when you have an early morning appt.   - Use a notebook for your planning.  3. Obtain twice the volume of vegetables as either protein or starchy foods 4 times per week.

## 2019-12-10 ENCOUNTER — Ambulatory Visit (INDEPENDENT_AMBULATORY_CARE_PROVIDER_SITE_OTHER): Payer: Medicaid Other

## 2019-12-10 DIAGNOSIS — R002 Palpitations: Secondary | ICD-10-CM

## 2019-12-14 ENCOUNTER — Ambulatory Visit: Payer: Medicaid Other | Admitting: Family Medicine

## 2019-12-20 ENCOUNTER — Telehealth: Payer: Self-pay

## 2019-12-20 NOTE — Telephone Encounter (Signed)
Patient calls nurse line requesting results of recent Holter Monitor placement. I do not see anything in chart regarding results. Will forward to provider to see if she has any information, if not, will call heart care tomorrow. amanda

## 2019-12-21 ENCOUNTER — Telehealth: Payer: Self-pay | Admitting: Family Medicine

## 2019-12-21 NOTE — Telephone Encounter (Signed)
Thank you Dr. Gwendlyn Deutscher!  I will discuss these options further with her then.

## 2019-12-21 NOTE — Telephone Encounter (Signed)
I called to discuss her Holter monitor. The only thing concerning is the Nonsustained tachycardia with 7 beats. I called and discussed this with Dr. Radford Pax. She recommended betablocker vs CCB for now and if no improvement of her symptoms to refer to cards.  I called and discussed plan with the patient. She is hesitant to start any meds now till f/u given that her BP is normal and she does not want anything to drop her low. This is reasonable especially if her symptoms is tolerable. Appointment made for her to see Dr. Shan Levans as I am not available next week.  ED visit if symptoms worsens.

## 2019-12-27 ENCOUNTER — Ambulatory Visit: Payer: Medicaid Other

## 2019-12-28 ENCOUNTER — Encounter: Payer: Self-pay | Admitting: Family Medicine

## 2019-12-28 ENCOUNTER — Other Ambulatory Visit: Payer: Self-pay

## 2019-12-28 ENCOUNTER — Ambulatory Visit (INDEPENDENT_AMBULATORY_CARE_PROVIDER_SITE_OTHER): Payer: Medicaid Other | Admitting: Family Medicine

## 2019-12-28 DIAGNOSIS — R002 Palpitations: Secondary | ICD-10-CM

## 2019-12-28 NOTE — Progress Notes (Signed)
    SUBJECTIVE:   CHIEF COMPLAINT / HPI:   Follow up of heart palpitations Carla Hanson reports that she is doing well overall and is no longer as concerned about her palpitations, although she does continue to feel them occasionally.  She reports some shortness of breath with activity but no shortness of breath with rest.  She continues to do aerobic exercise through YouTube and is looking forward to going back to the gym soon.  She is working on eating more vegetables and reducing her sugar intake.  She was counseled by Dr. Gwendlyn Deutscher about possible medications to treat her heart palpitations, but she would like to see what she can do to change her diet before trying any medications.  She denies chest pain.  PERTINENT  PMH / PSH: COPD, bipolar disorder, heart palpitations, history of PE, history of CVA, iron deficiency anemia  OBJECTIVE:   BP 126/72   Pulse 72   Wt (!) 322 lb 6.4 oz (146.2 kg)   SpO2 97%   BMI 55.34 kg/m   General: well appearing, appears stated age, obese Cardiac: RRR, no MRG Respiratory: CTAB, no rhonchi, rales, or wheezing, normal work of breathing Psych: appropriate mood and affect    ASSESSMENT/PLAN:   Heart palpitations Reassured by Holter monitor showing no arrhythmias and only short-lived episodes of tachycardia.  Reviewed with patient dietary changes to make palpitations less likely, including cessation of caffeine, reduction of sugars, staying well-hydrated with water and eating more fruits and vegetables for her overall health.  Congratulated patient on her exercise program.  She appears to be very motivated to continue this.  She will follow up in 2 months to get a repeat A1c check.     Kathrene Alu, MD [place]

## 2019-12-28 NOTE — Patient Instructions (Addendum)
It was nice seeing you today Ms. Bonner!  I'm glad that you're doing well.  Please avoid caffeine, reduce sugar intake, and work on eating plenty of vegetables and fruits and drinking plenty of water daily.  You are doing a fantastic job with your exercise.    If you would like to try medications for your heart palpitations, please let us know.  We can recheck your Hemoglobin A1c in about 2 months if you would like.  If you have any questions or concerns, please feel free to call the clinic.   Be well,  Dr. Shan Levans

## 2019-12-28 NOTE — Assessment & Plan Note (Signed)
Reassured by Holter monitor showing no arrhythmias and only short-lived episodes of tachycardia.  Reviewed with patient dietary changes to make palpitations less likely, including cessation of caffeine, reduction of sugars, staying well-hydrated with water and eating more fruits and vegetables for her overall health.  Congratulated patient on her exercise program.  She appears to be very motivated to continue this.  She will follow up in 2 months to get a repeat A1c check.

## 2020-01-05 ENCOUNTER — Other Ambulatory Visit: Payer: Self-pay

## 2020-01-05 MED ORDER — ALBUTEROL SULFATE HFA 108 (90 BASE) MCG/ACT IN AERS: INHALATION_SPRAY | RESPIRATORY_TRACT | 6 refills | Status: DC

## 2020-01-16 ENCOUNTER — Other Ambulatory Visit: Payer: Self-pay | Admitting: Internal Medicine

## 2020-01-19 ENCOUNTER — Telehealth (INDEPENDENT_AMBULATORY_CARE_PROVIDER_SITE_OTHER): Payer: Medicaid Other | Admitting: Family Medicine

## 2020-01-19 DIAGNOSIS — J441 Chronic obstructive pulmonary disease with (acute) exacerbation: Secondary | ICD-10-CM | POA: Diagnosis not present

## 2020-01-19 MED ORDER — AZITHROMYCIN 250 MG PO TABS: ORAL_TABLET | ORAL | 0 refills | Status: DC

## 2020-01-19 MED ORDER — PREDNISONE 20 MG PO TABS: 40.0000 mg | ORAL_TABLET | Freq: Every day | ORAL | 0 refills | Status: AC

## 2020-01-19 NOTE — Progress Notes (Signed)
[place] Telemedicine Visit I connected with  [name] on 01/19/20 by a video enabled telemedicine application and verified that I am speaking with the correct person using two identifiers.   I discussed the limitations of evaluation and management by telemedicine. The patient expressed understanding and agreed to proceed.  Patient consented to have virtual visit. Method of visit: Video  Encounter participants: Patient: Carla Hanson - located at [employer] (she was passenger and not driving)  Provider: Caroline More - located at [facility] Others (if applicable): None  Chief Complaint: cough   HPI: Cough Patient reports she has cough & congestion as well as SOB. Has been using her nebulizer and inhaler. Is having light colored mucous. Has had symptoms for 1.5 weeks. Patient reports SOB with exertion but mostly when she walks around. No blood in sputum. No fevers. No muscle aches. Still has taste and smell. No sick contacts. Staying at home. Patient is using her rescue inhaler tid every 4 hrs. Patient states she gets bronchitis 2/2 COPD. Has been a long time since she had this. Patient usually gets steroids and a z-pack and improves.   ROS: per HPI  Pertinent PMHx: COPD  Exam:  Respiratory: speaking full sentences, no increased WOB  Assessment/Plan:  COPD exacerbation (HCC) Acute exacerbation, with productive cough for 1.5 weeks .  Afebrile and and no increased WOB on video. Speaking full sentences. Will treat with steroid burst and abx. Patient understanding of this and understanding of when to return. Advised to go to urgent care vs. ED for worsening SOB. Advised of after hours emergency line in case of worsening symptoms overnight.  - Prednisone 40mg  qd x5d - Azithro (z-pack) x5d - Return precautions given, patient voiced understanding - may need CXR if worsening  - Advised that if she feels short of breath, she should go to either urgent care or ED  -  Continue home COPD medications      Time spent during visit with patient: 15 minutes

## 2020-01-19 NOTE — Assessment & Plan Note (Signed)
Acute exacerbation, with productive cough for 1.5 weeks .  Afebrile and and no increased WOB on video. Speaking full sentences. Will treat with steroid burst and abx. Patient understanding of this and understanding of when to return. Advised to go to urgent care vs. ED for worsening SOB. Advised of after hours emergency line in case of worsening symptoms overnight.  - Prednisone 40mg  qd x5d - Azithro (z-pack) x5d - Return precautions given, patient voiced understanding - may need CXR if worsening  - Advised that if she feels short of breath, she should go to either urgent care or ED  - Continue home COPD medications

## 2020-01-21 ENCOUNTER — Telehealth: Payer: Self-pay

## 2020-01-21 NOTE — Telephone Encounter (Signed)
Patient calls nurse line to check-in with Dr. Tammi Klippel. Patient states she was advised to contact Dr. Tammi Klippel by the end of the week regarding how she was feeling. Patient states that she is feeling much better. She still reports lingering cough but no shortness of breath.   To Dr. Tammi Klippel as Lucretia Kern, RN

## 2020-02-03 ENCOUNTER — Telehealth: Payer: Medicaid Other | Admitting: Family Medicine

## 2020-02-03 ENCOUNTER — Other Ambulatory Visit: Payer: Self-pay

## 2020-02-09 ENCOUNTER — Other Ambulatory Visit: Payer: Self-pay | Admitting: *Deleted

## 2020-02-09 DIAGNOSIS — J449 Chronic obstructive pulmonary disease, unspecified: Secondary | ICD-10-CM

## 2020-02-09 MED ORDER — DULERA 100-5 MCG/ACT IN AERO: INHALATION_SPRAY | RESPIRATORY_TRACT | 4 refills | Status: DC

## 2020-03-13 ENCOUNTER — Other Ambulatory Visit: Payer: Self-pay

## 2020-03-13 ENCOUNTER — Ambulatory Visit (INDEPENDENT_AMBULATORY_CARE_PROVIDER_SITE_OTHER): Payer: Medicaid Other | Admitting: Family Medicine

## 2020-03-13 DIAGNOSIS — R7303 Prediabetes: Secondary | ICD-10-CM

## 2020-03-13 NOTE — Progress Notes (Signed)
Medical Nutrition Therapy Telehealth Encounter  TEXT link: [phone] I connected with Carla Hanson (MRN [id number]) on 03/13/2020 by MyChart video-enabled, HIPAA-compliant telemedicine application, verified that I am speaking with the correct person using two identifiers, and that the patient was in a private environment conducive to confidentiality.  The patient agreed to proceed.  Provider was Kennith Center, PhD, RD, LDN, CEDRD Provider(s) located at home during this telehealth encounter; patient was at home  Appt start time: 1000 end time: 1100 (1 hour)  Reason for telehealth visit: She was referred for MNT by Dr. Gwendlyn Deutscher for Medical Nutrition Therapy for weight management and blood sugar control.    Relevant history/background:  [name]'s recent A1C was 6.0.  Although [name]'s weight increased progressively during the pandemic, she is highly motivated to improve both weight and BG control.    Assessment:   [name] has been limiting carbohydrate, although she did not remember specific dietary goal to keep carb portions at <2 per meal. She has limited exercise to inside b/c of pollen, but hopes to walk outside when allergy season subsides.  She is checking FBG most days, and has reduced juice intake to ~2 X wk, now usually diluting 1:1 with club soda.   Recent eating pattern: 3 meals (usually using smoothies in place of one meal) and 0-1 snack per day. Recent usual physical activity: 25-minute YouTube workout 4 X wk.  Walks occasionally as well.  Exercise was difficult recently with COPD exacerbation as well knee problems.   FBG: 96-103 (tracking in logbook) Weight: 313 lb (self-report); Home scale indicated 336 lb in late 2020.   24-hr recall:  (Up at 7 AM) B (9:30 AM)-  Protein shake (170 kcal, 25 g pro,  g sugar) Snk ( AM)-   L (12:30 PM)-  Zaxby's grilled chx sandw, 1/2 c fries, 16 oz lemonade Snk (4 PM)-  12 oz choc 2% milk D (7 PM)-  4 oz chx breast, 1 1/2 veg stir-fry, 1/2 c Chinese  noodles, water Snk (10 PM)-  1 granola bar Typical day? Yes.   Drinks water throughout day.   Intervention: Reviewed recent diet and exercise hx, and confirmed goals and means of documenting progress.  Emphasized importance of understanding what constitutes a carb portion.   For recommendations and goals, see Patient Instructions.    Follow-up: Telehealth visit in 6 week(s).

## 2020-03-13 NOTE — Patient Instructions (Addendum)
Keep track of today's recommendations, so you can maintain your focus on these goals.    Goals:  1. Limit starchy foods toONE to TWO per mealand ONE per snack. ONE portion of a starchy food is equal to the following:  - ONE slice of bread (or its equivalent, such as half of a hamburger bun).  - 1/2 cup of a "scoopable" starchy food such as potatoes or rice.  - 15 grams of Total Carbohydrate as shown on food label.  - Every 4 oz of a sweet drink (including fruit juice). 2. For the next two weeks, write down how many carb portions you have at each meal.   3. Obtain twice the volume of vegetables as either protein or starchy foods 4 times per week.   Follow-up: Telehealth visit via [place] on Monday, June 21 at 11 AM.  I willl text you an invitation link for the appt at that time.

## 2020-03-20 ENCOUNTER — Telehealth: Payer: Self-pay | Admitting: Family Medicine

## 2020-03-20 NOTE — Telephone Encounter (Signed)
Patient is calling to ask a question about blood Glucose. If you could please call patient to discuss. Call back number [phone] Thanks

## 2020-03-21 NOTE — Telephone Encounter (Signed)
Called patient back and answered questions regarding her daily glucose checks.  She has purchased a glucometer to check her sugars at home since her last appointment in October with pcp.  Patient states that she has changed her diet and exercise regimen and has lost almost 10 lbs.  Patient scheduled for a follow up appt with Dr. Gwendlyn Deutscher in June to follow up on her pre-diabetes.  Stephen Turnbaugh,CMA

## 2020-04-11 ENCOUNTER — Telehealth: Payer: Self-pay

## 2020-04-11 ENCOUNTER — Other Ambulatory Visit: Payer: Self-pay

## 2020-04-11 ENCOUNTER — Encounter: Payer: Self-pay | Admitting: Family Medicine

## 2020-04-11 ENCOUNTER — Ambulatory Visit (INDEPENDENT_AMBULATORY_CARE_PROVIDER_SITE_OTHER): Payer: Medicaid Other | Admitting: Family Medicine

## 2020-04-11 VITALS — BP 123/74 | HR 80 | Ht 64.0 in | Wt 324.0 lb

## 2020-04-11 DIAGNOSIS — Z1231 Encounter for screening mammogram for malignant neoplasm of breast: Secondary | ICD-10-CM

## 2020-04-11 DIAGNOSIS — F1021 Alcohol dependence, in remission: Secondary | ICD-10-CM

## 2020-04-11 DIAGNOSIS — I10 Essential (primary) hypertension: Secondary | ICD-10-CM | POA: Diagnosis not present

## 2020-04-11 DIAGNOSIS — E66813 Obesity, class 3: Secondary | ICD-10-CM

## 2020-04-11 DIAGNOSIS — F3176 Bipolar disorder, in full remission, most recent episode depressed: Secondary | ICD-10-CM | POA: Diagnosis not present

## 2020-04-11 DIAGNOSIS — R7309 Other abnormal glucose: Secondary | ICD-10-CM

## 2020-04-11 DIAGNOSIS — J019 Acute sinusitis, unspecified: Secondary | ICD-10-CM | POA: Diagnosis not present

## 2020-04-11 DIAGNOSIS — Z6841 Body Mass Index (BMI) 40.0 and over, adult: Secondary | ICD-10-CM | POA: Diagnosis not present

## 2020-04-11 DIAGNOSIS — J449 Chronic obstructive pulmonary disease, unspecified: Secondary | ICD-10-CM

## 2020-04-11 DIAGNOSIS — J329 Chronic sinusitis, unspecified: Secondary | ICD-10-CM | POA: Insufficient documentation

## 2020-04-11 DIAGNOSIS — Z1211 Encounter for screening for malignant neoplasm of colon: Secondary | ICD-10-CM

## 2020-04-11 DIAGNOSIS — E785 Hyperlipidemia, unspecified: Secondary | ICD-10-CM

## 2020-04-11 DIAGNOSIS — R7303 Prediabetes: Secondary | ICD-10-CM | POA: Diagnosis not present

## 2020-04-11 LAB — POCT GLYCOSYLATED HEMOGLOBIN (HGB A1C): HbA1c, POC (controlled diabetic range): 6.1 % (ref 0.0–7.0)

## 2020-04-11 MED ORDER — AZITHROMYCIN 250 MG PO TABS: ORAL_TABLET | ORAL | 0 refills | Status: DC

## 2020-04-11 MED ORDER — CETIRIZINE HCL 10 MG PO TABS: 10.0000 mg | ORAL_TABLET | Freq: Every day | ORAL | 1 refills | Status: DC

## 2020-04-11 NOTE — Assessment & Plan Note (Signed)
Stable off meds. Monitor closely.   Office Visit from 08/27/2019 in [place]  PHQ-9 Total Score  0

## 2020-04-11 NOTE — Patient Instructions (Signed)
It was nice seeing you today. Your A1C remains the same. Please work on diet and exercise as discussed. I will like to see you in about 1-2 or so for your PAP>

## 2020-04-11 NOTE — Assessment & Plan Note (Signed)
FLP checked today. I will contact her with the result.  

## 2020-04-11 NOTE — Assessment & Plan Note (Signed)
BP looks good. Continue current regimen. 

## 2020-04-11 NOTE — Assessment & Plan Note (Signed)
Likely seasonal. Cetirizine escribed. F/U soon if there is no improvement.

## 2020-04-11 NOTE — Assessment & Plan Note (Signed)
A1c 6.1 Diet and exercise counseling done. Cut back on Carbs and concentrated glucose diet. Increase fruit and vege as well as exercise.

## 2020-04-11 NOTE — Assessment & Plan Note (Addendum)
Body mass index is 55.61 kg/m. Diet and exercise counseling done. She is motivated to lose weight. Cut back on carbs. Increase protein and fiber in diet. Monitor closely for improvement. FLP tested today. I will call with the result.

## 2020-04-11 NOTE — Progress Notes (Signed)
SUBJECTIVE:   CHIEF COMPLAINT / HPI:  Cough Chronicity: C/O cough ongoing for more than two weeks. She feels this is related to her allergic rhinitis. The problem has been gradually worsening. The problem occurs constantly. The cough is productive of sputum (Productive of whitish sputum). Pertinent negatives include no chest pain, fever, nasal congestion, postnasal drip, sore throat, shortness of breath or wheezing. Nothing aggravates the symptoms. Treatments tried: Compliant with her COPD medication with minimal improvement of her symptoms. The treatment provided moderate relief. Her past medical history is significant for COPD.  Sinusitis This is a recurrent problem. The current episode started 1 to 4 weeks ago. The problem has been gradually worsening since onset. There has been no fever. Associated symptoms include coughing, sinus pressure and sneezing. Pertinent negatives include no hoarse voice, shortness of breath or sore throat. Past treatments include nothing.  HTN: She is compliant with HCTZ 12.5 mg qd and Losartan 50 mg qd. Home BP has been good.  Bipolar:Doing well off medication. She said this was triggered 3 years ago by her alcoholism. She has been sober now x 3 years going on her 4th year. She is in a good place mentally. HM: Due for PAP, colonoscopy and mammogram. PreDM/Weight: here for f/u. She exercise 4 days weekly, Keto bread for sandwich. Eats a lot of potatoes and salty food.    PERTINENT  PMH / PSH: PMX reviewed.  OBJECTIVE:   Vitals:   04/11/20 1110 04/11/20 1131  BP: 132/90 123/74  Pulse: 80   Weight: (!) 324 lb (147 kg)   Height: 5\' 4"  (1.626 m)      Physical Exam Vitals and nursing note reviewed.  Cardiovascular:     Rate and Rhythm: Normal rate and regular rhythm.     Heart sounds: No murmur.  Pulmonary:     Effort: Pulmonary effort is normal. No respiratory distress.     Breath sounds: Normal breath sounds. No wheezing or rhonchi.  Abdominal:    General: Abdomen is flat. Bowel sounds are normal.     Palpations: Abdomen is soft. There is no mass.     Tenderness: There is no abdominal tenderness.  Musculoskeletal:     Comments: trace right ankle swelling, no calf swelling or tenderness, no erythema.     ASSESSMENT/PLAN:   COPD, moderate (HCC) Current cough. No SOB. No fever. COVID in the differential vs Atypical pneumonia vs mild COPD exacerbation. I will not COVID test her since she is well appearing. O2 sat was unfortunately not checked during visit, although she did not have any respiratory distress. Continue albuterol as needed and Dulera for COPD. Zithromax for empirical treatment of atypical PNA.  Doubt COPD exacerbation, given no respiratory distress. Return soon if there is no improvement.  Sinusitis Likely seasonal. Cetirizine escribed. F/U soon if there is no improvement.  HYPERTENSION, BENIGN SYSTEMIC BP looks good. Continue current regimen.  Bipolar disorder ([place]) Stable off meds. Monitor closely.   Office Visit from 08/27/2019 in [place]  PHQ-9 Total Score  0       History of alcohol dependence ([place]) In remission x 3-4 years  Pre-diabetes A1c 6.1 Diet and exercise counseling done. Cut back on Carbs and concentrated glucose diet. Increase fruit and vege as well as exercise.  Class 3 severe obesity due to excess calories with serious comorbidity and body mass index (BMI) of 50.0 to 59.9 in adult [facility]) Body mass index is 55.61 kg/m. Diet and exercise counseling  done. She is motivated to lose weight. Cut back on carbs. Increase protein and fiber in diet. Monitor closely for improvement. FLP tested today. I will call with the result.  Hyperlipidemia FLP checked today. I will contact her with the result.  Health maintenance: Cologuard discussed and was ordered. She does not want to get colonoscopy. She will return for PAP. Mammogram ordered and she was given the  instruction to schedule an appointment.    Reduce salt intake, elevate LL and compression stockings recommended for trace RLL edema. Andrena Mews, MD [name]

## 2020-04-11 NOTE — Telephone Encounter (Signed)
Also advise her that I am uncertain if her insurance will cover it.

## 2020-04-11 NOTE — Telephone Encounter (Signed)
Patient calls nurse line regarding receiving rx for cetirizine. Patient states that she saw Dr. Gwendlyn Deutscher in clinic today and discussed this medication being sent in. Patient states pharmacy only received the azithromycin.   To PCP  Talbot Grumbling, RN

## 2020-04-11 NOTE — Assessment & Plan Note (Signed)
Current cough. No SOB. No fever. COVID in the differential vs Atypical pneumonia vs mild COPD exacerbation. I will not COVID test her since she is well appearing. O2 sat was unfortunately not checked during visit, although she did not have any respiratory distress. Continue albuterol as needed and Dulera for COPD. Zithromax for empirical treatment of atypical PNA.  Doubt COPD exacerbation, given no respiratory distress. Return soon if there is no improvement.

## 2020-04-11 NOTE — Assessment & Plan Note (Signed)
In remission x 3-4 years

## 2020-04-11 NOTE — Telephone Encounter (Signed)
I am just getting to her note and I have escribed her meds. Please let her know. Thanks.

## 2020-04-12 ENCOUNTER — Telehealth: Payer: Self-pay | Admitting: Family Medicine

## 2020-04-12 LAB — LIPID PANEL
Chol/HDL Ratio: 5.3 ratio — ABNORMAL HIGH (ref 0.0–4.4)
Cholesterol, Total: 216 mg/dL — ABNORMAL HIGH (ref 100–199)
HDL: 41 mg/dL (ref >39)
LDL Chol Calc (NIH): 152 mg/dL — ABNORMAL HIGH (ref 0–99)
Triglycerides: 129 mg/dL (ref 0–149)
VLDL Cholesterol Cal: 23 mg/dL (ref 5–40)

## 2020-04-12 NOTE — Telephone Encounter (Signed)
FLP result discussed with her. [place] ASCVD risk.  Moderate intensity Statins recommended. She declined. Prefers lifestyle modification for now.  Recheck in 6-12 months.

## 2020-04-24 ENCOUNTER — Other Ambulatory Visit: Payer: Self-pay

## 2020-04-24 ENCOUNTER — Ambulatory Visit (INDEPENDENT_AMBULATORY_CARE_PROVIDER_SITE_OTHER): Payer: Medicaid Other | Admitting: Family Medicine

## 2020-04-24 DIAGNOSIS — R7303 Prediabetes: Secondary | ICD-10-CM

## 2020-04-24 NOTE — Progress Notes (Signed)
Medical Nutrition Therapy Telehealth Encounter  TEXT link: [phone] I connected with CAROLLYNN PENNYWELL (MRN [id number]) on 04/24/2020 by MyChart video-enabled, HIPAA-compliant telemedicine application, verified that I am speaking with the correct person using two identifiers, and that the patient was in a private environment conducive to confidentiality.  The patient agreed to proceed.  Provider was Kennith Center, PhD, RD, LDN, CEDRD Provider(s) located at home during this telehealth encounter; patient was at home  Appt start time: 1000 end time: 1100 (1 hour)  Reason for telehealth visit: She was referred for MNT by Dr. Gwendlyn Deutscher for Medical Nutrition Therapy for weight management and blood sugar control.    Relevant history/background:  Kenetra's recent A1C was 6.0.  Although [name]'s weight increased progressively during the pandemic, she is highly motivated to improve both weight and BG control.    Assessment:   Sister has found it challenging to document progress on her goals.  She has, however, made a strong effort to limit carb intake.  She has been using Keto hamburger buns, breads, and cereals, which have ~half the amount of carbohydrate, most of which is fiber.  The cereals have Stevia and sugar alcohol.  If she has juice, she has been drinking 3 oz apple juice diluted to 12 oz with club soda.   Recent eating pattern: 2-3 meals (usually using smoothies in place of one meal) and 0-1 snack per day.  Has been having 32-oz smoothies 2-3 X day (1 c 2% milk, 4 oz Grk yogurt, 1/3 c blueberries, 1 1/2-2 c kale leaves, cinnamon, 1 small banana).   Recent usual physical activity: 25-minute YouTube workout 4 X wk.  Walks occasionally as well.   FBG: 93-96 (tracking in logbook); A1c 6.1 last week.   Weight: 319 lb on 6/15 (self-report); Home scale indicated 336 lb in late 2020.   24-hr recall:  (Up at 8 AM) B (11 AM)-  1 steamed burger, 1 fried egg, tomato & Amer cheese on Keto bun, 1 tsp mayo, 12 oz  low-Na V-8 juice, water Snk ( AM)-  water L (3 PM)-  --- Snk ( PM)-  2 Wholly Rollies (Keto protein balls - pnut butter, oats, dates, cacao nibs, coconut; 90 kcal/1, 6 g CHO, 1 g fiber, 3 g pro), water D (4:30 PM)-  3 oz ribeye steak, water Snk (8:30)-  1 c crockpot beef tips, Chinese noodles, 1 c broccoli Typical day? No.  Has at least 2 smoothies per day in place of meals 6 days a week.    Intervention: Reviewed progress on and confirmed dietary goals, and modified exercise goal.  Reviewed how to calculate carb content of a recipe and individual portion, and encouraged Annaclaire to do so, empowering her with knowledge of how much carb she consumes.    For recommendations and goals, see Patient Instructions.    Follow-up: In-office visit in 4 weeks.

## 2020-04-24 NOTE — Patient Instructions (Addendum)
Your A1c has gone up very slowly, but progressively in the past couple years.  The emphasis on limiting carbohydrate, increasing vegetables, and increasing exercise will help to bring your blood sugar down.    More vegetables every day will help with both blood sugar control and weight loss.   Since you prefer raw vegetables, make sure you keep plenty on hand, such as carrots, cucumbers, kale, spinach, salad greens, bell pepper, tomatoes, cabbage.    At meals that are not smoothies, be sure to include vegetables, aiming for twice the volume of vegetables as protein or starch foods.    Continue to limit carbohydrate to two portions per meal Remember that 15 grams of total carbohydrate = 1 carb portion.  You can probably have 2 carb portions per meal, and still keep your blood sugar in a good range.    NOTE: You can figure out the total amount of carb in any recipe you make:   1. Find the total carbohydrate content of one serving of the starchy ingredient (such as pasta or rice).    2. Multiply grams of total carbs by the number of servings of the starchy food you use in one recipe.    3. Figure out how many servings of your recipe you have made.    3. Divide the total carb content of the recipe by the number of servings you made, and that is the amount of carb per serving.    Do your video workouts at least 5 times a week.  Document in your (new) planner the minutes of exercise you get each day.   Follow-up in-office appt on Tues, July 20 at 10 AM.

## 2020-04-25 ENCOUNTER — Ambulatory Visit: Payer: Medicaid Other | Admitting: Family Medicine

## 2020-05-02 ENCOUNTER — Ambulatory Visit: Payer: Medicaid Other | Admitting: Family Medicine

## 2020-05-12 ENCOUNTER — Other Ambulatory Visit: Payer: Self-pay | Admitting: Family Medicine

## 2020-05-12 ENCOUNTER — Ambulatory Visit
Admission: RE | Admit: 2020-05-12 | Discharge: 2020-05-12 | Disposition: A | Discharge status: Home / Self Care | Payer: Medicaid Other | Source: Ambulatory Visit | Attending: Family Medicine | Admitting: Family Medicine

## 2020-05-12 ENCOUNTER — Other Ambulatory Visit: Payer: Self-pay

## 2020-05-12 DIAGNOSIS — Z1231 Encounter for screening mammogram for malignant neoplasm of breast: Secondary | ICD-10-CM

## 2020-05-12 DIAGNOSIS — N63 Unspecified lump in unspecified breast: Secondary | ICD-10-CM

## 2020-05-23 ENCOUNTER — Ambulatory Visit: Payer: Medicaid Other | Admitting: Family Medicine

## 2020-05-23 ENCOUNTER — Ambulatory Visit (INDEPENDENT_AMBULATORY_CARE_PROVIDER_SITE_OTHER): Payer: Medicaid Other | Admitting: Family Medicine

## 2020-05-23 ENCOUNTER — Encounter: Payer: Self-pay | Admitting: Family Medicine

## 2020-05-23 ENCOUNTER — Other Ambulatory Visit (HOSPITAL_COMMUNITY)
Admission: RE | Admit: 2020-05-23 | Discharge: 2020-05-23 | Disposition: A | Discharge status: Home / Self Care | Payer: Medicaid Other | Source: Ambulatory Visit | Attending: Family Medicine | Admitting: Family Medicine

## 2020-05-23 ENCOUNTER — Other Ambulatory Visit: Payer: Self-pay

## 2020-05-23 VITALS — BP 132/82 | HR 68 | Ht 64.0 in | Wt 324.4 lb

## 2020-05-23 DIAGNOSIS — Z1211 Encounter for screening for malignant neoplasm of colon: Secondary | ICD-10-CM | POA: Diagnosis not present

## 2020-05-23 DIAGNOSIS — Z01419 Encounter for gynecological examination (general) (routine) without abnormal findings: Secondary | ICD-10-CM

## 2020-05-23 DIAGNOSIS — Z124 Encounter for screening for malignant neoplasm of cervix: Secondary | ICD-10-CM | POA: Insufficient documentation

## 2020-05-23 NOTE — Progress Notes (Signed)
    SUBJECTIVE:   CHIEF COMPLAINT / HPI:   Gynecologic Exam The patient's pertinent negatives include no genital itching, genital lesions, vaginal bleeding or vaginal discharge. Primary symptoms comment: She is here for PAP. The patient is experiencing no pain. She is not pregnant. She is not sexually active (Last sexual activity was more than 3 years ago. She hope to get married sometime and will like to get GC/Chlamydia checked today regardless.Marland Kitchen).     PERTINENT  PMH / PSH: PMX reviewed  OBJECTIVE:   BP 132/82   Pulse 68   Ht _0  (1.626 m)   Wt (!) 324 lb 6 oz (147.1 kg)   SpO2 97%   BMI 55.68 kg/m   Physical Exam Vitals and nursing note reviewed. Exam conducted with a chaperone present Cherrie Distance Lygette).  Pulmonary:     Effort: Pulmonary effort is normal.  Abdominal:     General: There is no distension.     Palpations: Abdomen is soft.     Tenderness: There is no abdominal tenderness.  Genitourinary:    Labia:        Right: No tenderness or lesion.        Left: No tenderness or lesion.      Vagina: Normal.     Cervix: No cervical motion tenderness or discharge.     Uterus: Normal.      Adnexa: Right adnexa normal and left adnexa normal.      ASSESSMENT/PLAN:   Gyn exam: PAP completed. GC/Chlamydia and Trich included in the PAP order per her request. I will contact her soon with results.  Colon cancer screen:  She got her cologuard tool at home. Contemplating whether to go ahead with the test or not. Counseling provider. She will complete test at home as instructed and mail in her kit.   More than 50% of this 40 min face to face encounter was spent on counseling, procedure and coordination of care.  Andrena Mews, MD [place]

## 2020-05-23 NOTE — Patient Instructions (Signed)
pap Pap Test Why am I having this test? A Pap test, also called a Pap smear, is a screening test to check for signs of:  Cancer of the vagina, cervix, and uterus. The cervix is the lower part of the uterus that opens into the vagina.  Infection.  Changes that may be a sign that cancer is developing (precancerous changes). Women need this test on a regular basis. In general, you should have a Pap test every 3 years until you reach menopause or age 54. Women aged 30-60 may choose to have their Pap test done at the same time as an HPV (human papillomavirus) test every 5 years (instead of every 3 years). Your health care provider may recommend having Pap tests more or less often depending on your medical conditions and past Pap test results. What kind of sample is taken?  Your health care provider will collect a sample of cells from the surface of your cervix. This will be done using a small cotton swab, plastic spatula, or brush. This sample is often collected during a pelvic exam, when you are lying on your back on an exam table with feet in footrests (stirrups). In some cases, fluids (secretions) from the cervix or vagina may also be collected. How do I prepare for this test?  Be aware of where you are in your menstrual cycle. If you are menstruating on the day of the test, you may be asked to reschedule.  You may need to reschedule if you have a known vaginal infection on the day of the test.  Follow instructions from your health care provider about: ? Changing or stopping your regular medicines. Some medicines can cause abnormal test results, such as digitalis and tetracycline. ? Avoiding douching or taking a bath the day before or the day of the test. Tell a health care provider about:  Any allergies you have.  All medicines you are taking, including vitamins, herbs, eye drops, creams, and over-the-counter medicines.  Any blood disorders you have.  Any surgeries you have  had.  Any medical conditions you have.  Whether you are pregnant or may be pregnant. How are the results reported? Your test results will be reported as either abnormal or normal. A false-positive result can occur. A false positive is incorrect because it means that a condition is present when it is not. A false-negative result can occur. A false negative is incorrect because it means that a condition is not present when it is. What do the results mean? A normal test result means that you do not have signs of cancer of the vagina, cervix, or uterus. An abnormal result may mean that you have:  Cancer. A Pap test by itself is not enough to diagnose cancer. You will have more tests done in this case.  Precancerous changes in your vagina, cervix, or uterus.  Inflammation of the cervix.  An STD (sexually transmitted disease).  A fungal infection.  A parasite infection. Talk with your health care provider about what your results mean. Questions to ask your health care provider Ask your health care provider, or the department that is doing the test:  When will my results be ready?  How will I get my results?  What are my treatment options?  What other tests do I need?  What are my next steps? Summary  In general, women should have a Pap test every 3 years until they reach menopause or age 72.  Your health care provider will collect  a sample of cells from the surface of your cervix. This will be done using a small cotton swab, plastic spatula, or brush.  In some cases, fluids (secretions) from the cervix or vagina may also be collected. This information is not intended to replace advice given to you by your health care provider. Make sure you discuss any questions you have with your health care provider. Document Revised: 06/30/2017 Document Reviewed: 06/30/2017 Elsevier Patient Education  [place].

## 2020-05-26 ENCOUNTER — Telehealth: Payer: Self-pay | Admitting: Family Medicine

## 2020-05-26 LAB — CYTOLOGY - PAP
Chlamydia: NEGATIVE
Comment: NEGATIVE
Comment: NEGATIVE
Comment: NEGATIVE
Comment: NORMAL
Diagnosis: NEGATIVE
Diagnosis: REACTIVE
High risk HPV: NEGATIVE
Neisseria Gonorrhea: NEGATIVE
Trichomonas: NEGATIVE

## 2020-05-26 NOTE — Telephone Encounter (Signed)
Dental list mailed to patient.  Stanisha Lorenz,CMA

## 2020-05-26 NOTE — Telephone Encounter (Signed)
PAP result discussed.  Repeat in 5 years.  She requested dental referral. I advised that she does not need a referral. She can select any dentist from the area. I will mail her a list of dentist in the area.

## 2020-05-29 ENCOUNTER — Other Ambulatory Visit: Payer: Medicaid Other

## 2020-05-29 ENCOUNTER — Other Ambulatory Visit: Payer: Self-pay

## 2020-05-29 ENCOUNTER — Ambulatory Visit (INDEPENDENT_AMBULATORY_CARE_PROVIDER_SITE_OTHER): Payer: Medicaid Other | Admitting: Family Medicine

## 2020-05-29 DIAGNOSIS — R7303 Prediabetes: Secondary | ICD-10-CM | POA: Diagnosis not present

## 2020-05-29 NOTE — Progress Notes (Signed)
Medical Nutrition Therapy Telehealth Encounter  TEXT link: [phone] I connected with Carla Hanson (MRN [id number]) on 05/29/2020 by MyChart video-enabled, HIPAA-compliant telemedicine application, verified that I am speaking with the correct person using two identifiers, and that the patient was in a private environment conducive to confidentiality.  The patient agreed to proceed.  Provider was Kennith Center, PhD, RD, LDN, CEDRD Provider(s) located at home during this telehealth encounter; patient was at home  Appt start time: 1100 end time: 1200 (1 hour)  Reason for telehealth visit: She was referred for MNT by Dr. Gwendlyn Deutscher for Medical Nutrition Therapy for weight management and blood sugar control.    Relevant history/background:  Carla Hanson's recent A1C was 6.0.  Although Carla Hanson's weight increased progressively during the pandemic, she is highly motivated to improve both weight and BG control.    Assessment:   Carla Hanson said her poor appetite makes thinking ahead re. food especially challenging.  She is still not focused on carbohydrate content of meals b/c she is usually just avoiding carb's.  Main carb sources are keto bread (each slice = 11 g total carb, of which 10 g is fiber), orange juice (~5 oz w/ club soda), and `(limited portion sizes of) white potatoes.  She has been documenting her exercise, but recently dropped her planner behind her dresser, and has not yet been able to retrieve it.   Recent eating pattern: Same: 2-3 meals (usually using smoothies in place of one meal) and 0-1 snack per day.  Has been having 32-oz smoothies 1 X day (1 c 2% milk, milk or Grk yogurt, 1/3 c blueberries, 1 1/2-2 c kale leaves, cinnamon, 1 small banana).   Recent usual physical activity: Still the same: 25-minute YouTube workout ~4 X wk.   FBG: 86-93 (tracking in logbook).   Weight: Same, ~319 lb.  (Home scale indicated 336 lb in late 2020.)  24-hr recall:  (Up at 8 AM) B (9 AM)-  12 c coffee,4 tbsp diet  flavored cream (40 kcal) Snk (9:30)-  20 oz smoothie (1 banana, 1 c 2% milk, 1 c berries, 1 c carrots, pro pdr [20 g protein]) L (2 PM)-  3 slc fried green tomato, 4 slc fried zucchini, 1 scrmbled egg, 1 oz chs, 1 hash brown patty, 1 small avocado, 5 oz orange juice w/ club soda, water Snk (5 PM)-  1 Kind bar D (7:30 PM)-  1 Kind bar Snk ( PM)-  --- Typical day? No.  Appetite has been poor.  More typical is breakfast of protein smoothie, lunch of [place] sandwich on pita or keto cereal, and usually getting kale in smoothies or getting a cooked veg at dinner.    Intervention: Reviewed progress on and established new goals.  Emphasized value of documentation.    For recommendations and goals, see Patient Instructions.    Follow-up: In-office visit in 10 weeks.

## 2020-05-29 NOTE — Patient Instructions (Addendum)
Not feeling hungry does not mean you don't need to eat.  As you mentioned, you are more likely to get vegetables when you have sit-down meals.      - If you really aren't hungry, and it has been at least 5 hours since you last ate, then have some fruit, a smoothie with both fruits and veg's, or a salad for your meal.    Plan ahead for your meals and snacks, so you meet your nutrient needs.    Make good use of frozen vegetables, which will keep for  Along time, and will make it easier to have vegetables without a lot of work.    Reminder:  You committed to trying to retrieve your planner today.  (Good luck!)    Goals:  1. Video workouts at least 5 times a week.       Document in your planner the minutes of exercise you get each day.      Add up all the minutes of workouts each week, so you can see your weekly progress at a glance.  Your goal is 75 min per week (25 min X 5).    2. Sit down for at least 2 meals a day at least 4 days a week.       Document in your planner.  Also add the number of days you meet this goal, and write this down beside your weekly exercise minutes.  Your goal is 4!  Follow-up in-office appt on Thursday, Oct 7 at 11 AM.

## 2020-05-30 ENCOUNTER — Ambulatory Visit
Admission: RE | Admit: 2020-05-30 | Discharge: 2020-05-30 | Disposition: A | Discharge status: Home / Self Care | Payer: Medicaid Other | Source: Ambulatory Visit | Attending: Family Medicine | Admitting: Family Medicine

## 2020-05-30 ENCOUNTER — Other Ambulatory Visit: Payer: Self-pay

## 2020-05-30 DIAGNOSIS — N63 Unspecified lump in unspecified breast: Secondary | ICD-10-CM

## 2020-05-30 DIAGNOSIS — N6321 Unspecified lump in the left breast, upper outer quadrant: Secondary | ICD-10-CM | POA: Diagnosis not present

## 2020-05-30 DIAGNOSIS — R922 Inconclusive mammogram: Secondary | ICD-10-CM | POA: Diagnosis not present

## 2020-06-19 ENCOUNTER — Ambulatory Visit (INDEPENDENT_AMBULATORY_CARE_PROVIDER_SITE_OTHER): Payer: Medicaid Other | Admitting: Family Medicine

## 2020-06-19 ENCOUNTER — Other Ambulatory Visit: Payer: Self-pay

## 2020-06-19 DIAGNOSIS — J441 Chronic obstructive pulmonary disease with (acute) exacerbation: Secondary | ICD-10-CM

## 2020-06-19 MED ORDER — AZITHROMYCIN 250 MG PO TABS: ORAL_TABLET | ORAL | 0 refills | Status: DC

## 2020-06-19 MED ORDER — PREDNISONE 20 MG PO TABS: 40.0000 mg | ORAL_TABLET | Freq: Every day | ORAL | 0 refills | Status: AC

## 2020-06-19 NOTE — Assessment & Plan Note (Signed)
Chronic reoccurrence. C/f viral etiology (COVID; patient is unvaccinated with known sick contacts). Desaturation to 84% on RA w/ ambulation, 93% at rest. Endorsing increase work of breathing. Advised to go to ED for further evaulation due to concern for respiratory compromise. Patient informed that worsening of symptoms could result in a terrible outcome (at the worse death) if ignored. She voiced understanding. Does not want to wait in ED and plan to get COVID tested at local pharmacy. Encouraged patient to share results when available and go to ED if symptoms worsen. She voice understand and demonstrated capacity. For now will assist with Azithromycin and Prednisone x5 days.

## 2020-06-19 NOTE — Patient Instructions (Signed)
It was very nice to meet you today. Please enjoy the rest of your week. Today you were seen for a COPD exacerbation. I am concerned you may have COVID, please go to [email or web address]  For testing sites and time. Continue using your inhalers as prescribed. I have prescribed 5 days of azithromycin (500mg  on the first day and 250mg  for 4 additional days) and prednisone 40 mg daily for 5 days.   Because of your low oxygen in the clinic. I advise you to be evaluated in the ED. If you decide not to be seen in the ED please consider going if your shortness of breath gets worse as this could be a more serious cause and result in death if not attended to properly.   Please call the clinic at [phone] if you have any concerns. It was our pleasure to serve you.

## 2020-06-19 NOTE — Progress Notes (Signed)
    SUBJECTIVE:   CHIEF COMPLAINT / HPI:   Carla Hanson is a 75 F who presents for the concern below.  COPD exacerbation Started having symptoms of congestion, shortness of breath, headache (relieved by tylenol), and yellow mucus production for a duration of 4 days. Known sick contact of daughter and grandson whom has been sick for the same amount of time as patient. Endorses compliance with dulera and albuterol.  PERTINENT  PMH / PSH: COPD w/ recurrent bronchitis (tx Zithromax 04/11/20); Hx of DVT (04/17/2016)  OBJECTIVE:   LMP 05/30/2020 (Exact Date)   SpO2 (!) 84% Comment: ambulatory in room  General: Appears well, no acute distress. Age appropriate. Cardiac: RRR, normal heart sounds, no murmurs Respiratory: CTAB, mild increased effort w/ ambulation   ASSESSMENT/PLAN:   COPD exacerbation (HCC) Chronic reoccurrence. C/f viral etiology (COVID; patient is unvaccinated with known sick contacts). Desaturation to 84% on RA w/ ambulation, 93% at rest. Endorsing increase work of breathing. Advised to go to ED for further evaulation due to concern for respiratory compromise. Patient informed that worsening of symptoms could result in a terrible outcome (at the worse death) if ignored. She voiced understanding. Does not want to wait in ED and plan to get COVID tested at local pharmacy. Encouraged patient to share results when available and go to ED if symptoms worsen. She voice understand and demonstrated capacity. For now will assist with Azithromycin and Prednisone x5 days.    Gerlene Fee, [place]

## 2020-06-23 ENCOUNTER — Other Ambulatory Visit: Payer: Self-pay | Admitting: Family Medicine

## 2020-06-23 DIAGNOSIS — Z20822 Contact with and (suspected) exposure to covid-19: Secondary | ICD-10-CM | POA: Diagnosis not present

## 2020-06-23 DIAGNOSIS — J449 Chronic obstructive pulmonary disease, unspecified: Secondary | ICD-10-CM

## 2020-06-23 DIAGNOSIS — Z03818 Encounter for observation for suspected exposure to other biological agents ruled out: Secondary | ICD-10-CM | POA: Diagnosis not present

## 2020-06-27 ENCOUNTER — Telehealth: Payer: Self-pay

## 2020-06-27 NOTE — Telephone Encounter (Signed)
Received phone call from patient to report negative COVID test from 8/20.   FYI to PCP  Talbot Grumbling, RN

## 2020-08-02 ENCOUNTER — Other Ambulatory Visit: Payer: Self-pay | Admitting: Family Medicine

## 2020-08-02 DIAGNOSIS — I10 Essential (primary) hypertension: Secondary | ICD-10-CM

## 2020-08-10 ENCOUNTER — Ambulatory Visit: Payer: Medicaid Other | Admitting: Family Medicine

## 2020-08-10 ENCOUNTER — Other Ambulatory Visit: Payer: Self-pay | Admitting: Family Medicine

## 2020-08-10 DIAGNOSIS — I10 Essential (primary) hypertension: Secondary | ICD-10-CM

## 2020-10-02 ENCOUNTER — Other Ambulatory Visit: Payer: Self-pay

## 2020-10-02 ENCOUNTER — Encounter: Payer: Medicaid Other | Admitting: Family Medicine

## 2020-10-02 NOTE — Patient Instructions (Signed)
From 7/26:  - If you really aren't hungry, and it has been at least 5 hours since you last ate, then have some fruit, a smoothie with both fruits and veg's, or a salad for your meal.   Plan ahead for your meals and snacks, so you meet your nutrient needs.   Make good use of frozen vegetables, which will keep for a long time, and will make it easier to have vegetables without a lot of work.   Goals:  1. Video workouts at least 5 times a week.      Document in your planner the minutes of exercise you get each day.      Add up all the minutes of workouts each week, so you can see your weekly progress at a glance.  Your goal is 75 min per week (25 min X 5).   2. Sit down for at least 2 meals a day at least 4 days a week.       Document in your planner.  Also add the number of days you meet this goal, and write this down beside your weekly exercise minutes.  Your goal is 4! Follow-up video appt on

## 2020-10-02 NOTE — Progress Notes (Signed)
This encounter was created in error - please disregard.

## 2020-10-04 ENCOUNTER — Telehealth: Payer: Self-pay

## 2020-10-04 NOTE — Telephone Encounter (Signed)
Hello,  I have two back to back meeting set up from 3-5 PM and I will not be able to call her today. Please help her schedule a virtual appointment with me this Friday at 49. I have one slot opened. Please give her ED precautions. Thanks.

## 2020-10-04 NOTE — Telephone Encounter (Signed)
Patient calls nurse line requesting to speak with PCP regarding concerns with menstrual cycle. Patient reports having irregular periods, with most recent starting on 9/21. Patient reports that she is continuing to have bleeding. Patient reports light cramping, no dizziness, or other symptoms.   Patient is requesting to discuss with provider in further detail.   Please call patient back at [phone]  Talbot Grumbling, RN

## 2020-10-05 NOTE — Telephone Encounter (Signed)
I called to discuss with the patient. I suspected premenopausal symptoms. Her menses improved.   I offered her an appointment but she declined for now. She will call in the future if she experiences further abnormality.

## 2020-10-21 ENCOUNTER — Other Ambulatory Visit: Payer: Self-pay | Admitting: Family Medicine

## 2020-11-02 ENCOUNTER — Other Ambulatory Visit: Payer: Self-pay | Admitting: Family Medicine

## 2020-11-03 ENCOUNTER — Other Ambulatory Visit: Payer: Self-pay | Admitting: Family Medicine

## 2020-11-03 DIAGNOSIS — I10 Essential (primary) hypertension: Secondary | ICD-10-CM

## 2020-11-06 ENCOUNTER — Other Ambulatory Visit: Payer: Self-pay | Admitting: Family Medicine

## 2020-11-08 ENCOUNTER — Telehealth: Payer: Self-pay | Admitting: Family Medicine

## 2020-11-16 ENCOUNTER — Encounter: Payer: Medicaid Other | Admitting: Family Medicine

## 2020-11-16 ENCOUNTER — Other Ambulatory Visit: Payer: Self-pay

## 2020-11-16 NOTE — Progress Notes (Signed)
This encounter was created in error - please disregard.

## 2020-11-21 ENCOUNTER — Ambulatory Visit: Payer: Medicaid Other | Admitting: Family Medicine

## 2020-12-02 DIAGNOSIS — Z20822 Contact with and (suspected) exposure to covid-19: Secondary | ICD-10-CM | POA: Diagnosis not present

## 2020-12-10 DIAGNOSIS — Z20822 Contact with and (suspected) exposure to covid-19: Secondary | ICD-10-CM | POA: Diagnosis not present

## 2020-12-18 DIAGNOSIS — Z20822 Contact with and (suspected) exposure to covid-19: Secondary | ICD-10-CM | POA: Diagnosis not present

## 2020-12-19 ENCOUNTER — Encounter: Payer: Self-pay | Admitting: Family Medicine

## 2020-12-19 ENCOUNTER — Ambulatory Visit (INDEPENDENT_AMBULATORY_CARE_PROVIDER_SITE_OTHER): Payer: Medicaid Other | Admitting: Family Medicine

## 2020-12-19 ENCOUNTER — Other Ambulatory Visit: Payer: Self-pay

## 2020-12-19 VITALS — BP 128/62 | HR 81 | Ht 64.0 in | Wt 335.0 lb

## 2020-12-19 DIAGNOSIS — E785 Hyperlipidemia, unspecified: Secondary | ICD-10-CM | POA: Diagnosis not present

## 2020-12-19 DIAGNOSIS — Z6841 Body Mass Index (BMI) 40.0 and over, adult: Secondary | ICD-10-CM

## 2020-12-19 DIAGNOSIS — E559 Vitamin D deficiency, unspecified: Secondary | ICD-10-CM

## 2020-12-19 DIAGNOSIS — R070 Pain in throat: Secondary | ICD-10-CM | POA: Diagnosis not present

## 2020-12-19 DIAGNOSIS — R1013 Epigastric pain: Secondary | ICD-10-CM | POA: Diagnosis not present

## 2020-12-19 DIAGNOSIS — J449 Chronic obstructive pulmonary disease, unspecified: Secondary | ICD-10-CM | POA: Diagnosis not present

## 2020-12-19 DIAGNOSIS — R7303 Prediabetes: Secondary | ICD-10-CM | POA: Diagnosis not present

## 2020-12-19 DIAGNOSIS — Z23 Encounter for immunization: Secondary | ICD-10-CM

## 2020-12-19 LAB — POCT GLYCOSYLATED HEMOGLOBIN (HGB A1C): Hemoglobin A1C: 6.4 % — AB (ref 4.0–5.6)

## 2020-12-19 LAB — POCT RAPID STREP A (OFFICE): Rapid Strep A Screen: NEGATIVE

## 2020-12-19 MED ORDER — FAMOTIDINE 20 MG PO TABS
20.0000 mg | ORAL_TABLET | Freq: Two times a day (BID) | ORAL | 1 refills | Status: DC
Start: 2020-12-19 — End: 2021-05-15

## 2020-12-19 NOTE — Assessment & Plan Note (Signed)
Level checked today.  

## 2020-12-19 NOTE — Progress Notes (Signed)
SUBJECTIVE:   CHIEF COMPLAINT / HPI:   Abdominal Pain This is a new problem. The current episode started more than 1 month ago. The onset quality is gradual. The problem occurs intermittently (worse when she eats spicy meal). The problem has been waxing and waning. The pain is located in the epigastric region. Pain scale: Last flare was a few nights ago, mild to moderate. Quality: discomfort. The abdominal pain does not radiate. Associated symptoms include constipation. Pertinent negatives include no diarrhea, hematochezia, nausea or vomiting. Exacerbated by: spicy and fatty foot. Relieved by: avoiding food trigger. The treatment provided moderate relief.   Pre-DM2/Weight management:Here for follow-up.  COPD:Improved breathing. Compliant with her meds. Takes Albuterol prn.  TDD:UKGU control. Here for f/u.  Throat pain: Present since COVID-19 infection a few weeks ago. Mostly on the right side of her neck.    PERTINENT  PMH / PSH: PMX reviewed.  OBJECTIVE:   BP 128/62   Pulse 81   Ht 5\' 4"  (1.626 m)   Wt (!) 335 lb (152 kg)   LMP 09/25/2020   SpO2 98%   BMI 57.50 kg/m   Physical Exam Vitals and nursing note reviewed.  Constitutional:      Appearance: Normal appearance.  HENT:     Mouth/Throat:     Pharynx: Oropharynx is clear. No oropharyngeal exudate or posterior oropharyngeal erythema.     Comments: No oropharyngeal erythema or edema Cardiovascular:     Rate and Rhythm: Normal rate and regular rhythm.     Heart sounds: Normal heart sounds. No murmur heard.   Pulmonary:     Effort: Pulmonary effort is normal. No respiratory distress.     Breath sounds: Normal breath sounds. No wheezing.  Abdominal:     General: Bowel sounds are normal. There is no distension.     Palpations: Abdomen is soft. There is no mass.     Tenderness: There is no abdominal tenderness.  Musculoskeletal:     Cervical back: Neck supple.     Right lower leg: No edema.     Left lower leg:  No edema.  Lymphadenopathy:     Cervical: No cervical adenopathy.      ASSESSMENT/PLAN:  Epigastric pain: ?? GERD Urea breath test completed today. Start Pepcid. I encouraged her to complete colon cancer screen. She still has her cologuard at home and she agreed to send that in soon.  Bipolar disorder ([place]) Off med for years.  No concerns No meds for more than 5 yrs. Can safely take off her problem list  Pre-diabetes A1C increased to 6.4 She is concerned about the increase. As discussed with her, I will check with Dr. Jenne Campus regarding the best referral option for her which will include a diet and exercise program. I will contact her soon regarding this.  Class 3 severe obesity due to excess calories with serious comorbidity and body mass index (BMI) of 50.0 to 59.9 in adult [facility]) She is motivated to lose weight. As discussed with her, I will check with Dr. Jenne Campus regarding the best referral option for her which will include a diet and exercise program. I will contact her soon regarding this.  COPD, moderate ([place]) Doing well on current regimen.  Hyperlipidemia FLP checked today.  Vitamin D deficiency Level checked today.  Throat pain: Rapid strep neg. HEENT exam benign. Monitor closely. Neck U/S in the future if no improvement. I contacted her regarding this info and she agreed with the plan.  SHINGRIX/shingles vaccination discussed: She declined for now.     Andrena Mews, MD [place]

## 2020-12-19 NOTE — Assessment & Plan Note (Signed)
FLP checked today.

## 2020-12-19 NOTE — Assessment & Plan Note (Signed)
A1C increased to 6.4 She is concerned about the increase. As discussed with her, I will check with Dr. Jenne Campus regarding the best referral option for her which will include a diet and exercise program. I will contact her soon regarding this.

## 2020-12-19 NOTE — Assessment & Plan Note (Signed)
She is motivated to lose weight. As discussed with her, I will check with Dr. Jenne Campus regarding the best referral option for her which will include a diet and exercise program. I will contact her soon regarding this.

## 2020-12-19 NOTE — Assessment & Plan Note (Addendum)
Off med for years.  No concerns No meds for more than 5 yrs. Can safely take off her problem list

## 2020-12-19 NOTE — Assessment & Plan Note (Signed)
Doing well on current regimen .  

## 2020-12-19 NOTE — Patient Instructions (Signed)
Helicobacter Pylori Antibodies Test Why am I having this test? This test is used to check for a type of bacteria called Helicobacter pylori (H. pylori). H. pylori can be found in the cells that line the stomach. Having high levels of H. pylori in your stomach puts you at risk for:  Stomach ulcers and small bowel ulcers.  Long-term (chronic) inflammation of the lining of the stomach.  Ulcers in the part of the body that moves food from the mouth to the stomach (esophagus).  Stomach cancer if the infection is not treated. Most people with H. pylori in their stomach have no symptoms. Your health care provider may ask you to have this test if you have symptoms of a stomach ulcer or small bowel ulcer, such as stomach pain before or after eating, heartburn, or nausea after eating. What is being tested? This test checks your blood for antibodies to the H. pylori bacteria. Antibodies are proteins made by your immune system to fight germs and infection. The test checks for antibodies that the immune system produces in response to infection with H. pylori. What kind of sample is taken? A blood sample is required for this test. It is usually collected by inserting a needle into a blood vessel or by sticking a finger with a small needle.   Tell a health care provider about:  All medicines you are taking, including vitamins, herbs, eye drops, creams, and over-the-counter medicines. How are the results reported? Your test results will be reported as values that are categorized as positive, negative, or equivocal. Equivocal means that your results are neither positive nor negative. Your health care provider will compare your results to normal ranges that were established after testing a large group of people (reference ranges). Reference ranges may vary among labs and hospitals. For this test, common reference ranges for the two types of antibodies that may be tested are:  IgG antibodies: ? Less than 0.75  units/mL. This is negative. ? Greater than or equal to 1 unit/mL. This is positive. ? 0.75-0.99 units/mL. This is equivocal.  IgM antibodies: ? Less than or equal to 30 units/mL. This is negative. ? Greater than or equal to 40 units/mL. This is positive. ? 30.01-39.99 units/mL. This is equivocal. What do the results mean? Test results that are higher than normal, or positive, may indicate various health conditions, such as:  Short-term or long-term irritation of the stomach lining (gastritis).  Small bowel ulcer.  Stomach ulcer.  Stomach cancer. Talk with your health care provider about what your results mean. Questions to ask your health care provider Ask your health care provider, or the department that is doing the test:  When will my results be ready?  How will I get my results?  What are my treatment options?  What other tests do I need?  What are my next steps? Summary  This test is used to check for a type of bacteria called Helicobacter pylori (H. pylori). Having high levels of H. pylori in your stomach puts you at risk for ulcers in the gastrointestinal tract or stomach cancer.  This test checks your blood for antibodies to the H. pylori bacteria.  Most people with H. pylori in their stomach have no symptoms. Your health care provider may ask you to have this test if you have symptoms of a stomach ulcer or small bowel ulcer, such as stomach pain before or after eating, heartburn, or nausea after eating.  Talk with your health care provider about  what your results mean. This information is not intended to replace advice given to you by your health care provider. Make sure you discuss any questions you have with your health care provider. Document Revised: 10/03/2017 Document Reviewed: 06/03/2017 Elsevier Patient Education  [place].

## 2020-12-19 NOTE — Assessment & Plan Note (Signed)
No relapse 

## 2020-12-20 ENCOUNTER — Telehealth: Payer: Self-pay | Admitting: Family Medicine

## 2020-12-20 ENCOUNTER — Other Ambulatory Visit: Payer: Self-pay | Admitting: Family Medicine

## 2020-12-20 DIAGNOSIS — R7303 Prediabetes: Secondary | ICD-10-CM

## 2020-12-20 DIAGNOSIS — Z7689 Persons encountering health services in other specified circumstances: Secondary | ICD-10-CM

## 2020-12-20 DIAGNOSIS — E785 Hyperlipidemia, unspecified: Secondary | ICD-10-CM

## 2020-12-20 LAB — LIPID PANEL
Chol/HDL Ratio: 5.4 ratio — ABNORMAL HIGH (ref 0.0–4.4)
Cholesterol, Total: 209 mg/dL — ABNORMAL HIGH (ref 100–199)
HDL: 39 mg/dL — ABNORMAL LOW (ref >39)
LDL Chol Calc (NIH): 143 mg/dL — ABNORMAL HIGH (ref 0–99)
Triglycerides: 150 mg/dL — ABNORMAL HIGH (ref 0–149)
VLDL Cholesterol Cal: 27 mg/dL (ref 5–40)

## 2020-12-20 LAB — H. PYLORI BREATH TEST: H pylori Breath Test: NEGATIVE

## 2020-12-20 LAB — VITAMIN D 25 HYDROXY (VIT D DEFICIENCY, FRACTURES): Vit D, 25-Hydroxy: 13.7 ng/mL — ABNORMAL LOW (ref 30.0–100.0)

## 2020-12-20 MED ORDER — VITAMIN D (ERGOCALCIFEROL) 1.25 MG (50000 UNIT) PO CAPS: 50000.0000 [IU] | ORAL_CAPSULE | ORAL | 1 refills | Status: DC

## 2020-12-20 NOTE — Telephone Encounter (Signed)
HIPAA compliant callback message left.  Note:  Lipid profile is still elevated, although improved a bit. Triglyceride now high. She need to start Statin/Cholesterol meds I will escribe med if she agrees.   Vitamin D is low. Need supplement. Will like to start her on Vit D 50000 units weekly.

## 2020-12-20 NOTE — Addendum Note (Signed)
Addended by: Andrena Mews T on: 12/20/2020 11:34 AM   Modules accepted: Orders

## 2020-12-20 NOTE — Telephone Encounter (Signed)
I discussed test results with her. She does not want to start Statin therapy. She requested referral to nutritionist.  I gave her information to contact [facility] [place] at [phone]. I will also enter epic referral.  I discussed low Vit D level. She will like to start weekly supplement. Med Escribed. Recheck in 8 weeks.

## 2020-12-20 NOTE — Telephone Encounter (Signed)
Patient returns call to nurse line. Informed of below. Patient would like to speak with provider regarding addition of cholesterol medication.   To PCP  Talbot Grumbling, RN

## 2021-01-03 ENCOUNTER — Other Ambulatory Visit: Payer: Self-pay

## 2021-01-03 ENCOUNTER — Ambulatory Visit (INDEPENDENT_AMBULATORY_CARE_PROVIDER_SITE_OTHER): Payer: Medicaid Other | Admitting: Family Medicine

## 2021-01-03 ENCOUNTER — Encounter (INDEPENDENT_AMBULATORY_CARE_PROVIDER_SITE_OTHER): Payer: Self-pay | Admitting: Family Medicine

## 2021-01-03 VITALS — BP 143/73 | HR 72 | Temp 98.0°F | Ht 63.0 in | Wt 324.0 lb

## 2021-01-03 DIAGNOSIS — R5383 Other fatigue: Secondary | ICD-10-CM | POA: Diagnosis not present

## 2021-01-03 DIAGNOSIS — Z1331 Encounter for screening for depression: Secondary | ICD-10-CM | POA: Diagnosis not present

## 2021-01-03 DIAGNOSIS — D649 Anemia, unspecified: Secondary | ICD-10-CM | POA: Diagnosis not present

## 2021-01-03 DIAGNOSIS — R0602 Shortness of breath: Secondary | ICD-10-CM

## 2021-01-03 DIAGNOSIS — Z6841 Body Mass Index (BMI) 40.0 and over, adult: Secondary | ICD-10-CM

## 2021-01-03 DIAGNOSIS — Z0289 Encounter for other administrative examinations: Secondary | ICD-10-CM

## 2021-01-03 DIAGNOSIS — R7303 Prediabetes: Secondary | ICD-10-CM

## 2021-01-03 DIAGNOSIS — I10 Essential (primary) hypertension: Secondary | ICD-10-CM | POA: Diagnosis not present

## 2021-01-04 LAB — COMPREHENSIVE METABOLIC PANEL
ALT: 15 IU/L (ref 0–32)
AST: 14 IU/L (ref 0–40)
Albumin/Globulin Ratio: 1.3 (ref 1.2–2.2)
Albumin: 4.3 g/dL (ref 3.8–4.9)
Alkaline Phosphatase: 57 IU/L (ref 44–121)
BUN/Creatinine Ratio: 21 (ref 9–23)
BUN: 13 mg/dL (ref 6–24)
Bilirubin Total: 0.3 mg/dL (ref 0.0–1.2)
CO2: 23 mmol/L (ref 20–29)
Calcium: 9.8 mg/dL (ref 8.7–10.2)
Chloride: 102 mmol/L (ref 96–106)
Creatinine, Ser: 0.62 mg/dL (ref 0.57–1.00)
Globulin, Total: 3.3 g/dL (ref 1.5–4.5)
Glucose: 97 mg/dL (ref 65–99)
Potassium: 4.2 mmol/L (ref 3.5–5.2)
Sodium: 142 mmol/L (ref 134–144)
Total Protein: 7.6 g/dL (ref 6.0–8.5)
eGFR: 106 mL/min/{1.73_m2} (ref >59)

## 2021-01-04 LAB — CBC WITH DIFFERENTIAL/PLATELET
Basophils Absolute: 0 10*3/uL (ref 0.0–0.2)
Basos: 0 %
EOS (ABSOLUTE): 0.3 10*3/uL (ref 0.0–0.4)
Eos: 3 %
Hematocrit: 41.4 % (ref 34.0–46.6)
Hemoglobin: 13.2 g/dL (ref 11.1–15.9)
Immature Grans (Abs): 0 10*3/uL (ref 0.0–0.1)
Immature Granulocytes: 0 %
Lymphocytes Absolute: 2.7 10*3/uL (ref 0.7–3.1)
Lymphs: 31 %
MCH: 25.1 pg — ABNORMAL LOW (ref 26.6–33.0)
MCHC: 31.9 g/dL (ref 31.5–35.7)
MCV: 79 fL (ref 79–97)
Monocytes Absolute: 0.5 10*3/uL (ref 0.1–0.9)
Monocytes: 5 %
Neutrophils Absolute: 5.3 10*3/uL (ref 1.4–7.0)
Neutrophils: 61 %
Platelets: 346 10*3/uL (ref 150–450)
RBC: 5.25 x10E6/uL (ref 3.77–5.28)
RDW: 14.8 % (ref 11.7–15.4)
WBC: 8.8 10*3/uL (ref 3.4–10.8)

## 2021-01-04 LAB — INSULIN, RANDOM: INSULIN: 77.3 u[IU]/mL — ABNORMAL HIGH (ref 2.6–24.9)

## 2021-01-04 LAB — T4: T4, Total: 7.3 ug/dL (ref 4.5–12.0)

## 2021-01-04 LAB — TSH: TSH: 1.25 u[IU]/mL (ref 0.450–4.500)

## 2021-01-04 LAB — FOLATE: Folate: 6 ng/mL (ref >3.0)

## 2021-01-04 LAB — T3: T3, Total: 109 ng/dL (ref 71–180)

## 2021-01-04 NOTE — Progress Notes (Signed)
Chief Complaint:   OBESITY LAIKYNN POLLIO (MR# [id number]) is a 55 y.o. female who presents for evaluation and treatment of obesity and related comorbidities. Current BMI is Body mass index is 57.39 kg/m. Anelia has been struggling with her weight for many years and has been unsuccessful in either losing weight, maintaining weight loss, or reaching her healthy weight goal.  Tayona is currently in the action stage of change and ready to dedicate time achieving and maintaining a healthier weight. Damyah is interested in becoming our patient and working on intensive lifestyle modifications including (but not limited to) diet and exercise for weight loss.  Katrena's habits were reviewed today and are as follows: Her family eats meals together, her desired weight loss is 189 lbs, she started gaining weight after having children, her heaviest weight ever was 325 pounds, she has significant food cravings issues, she is frequently drinking liquids with calories, she frequently makes poor food choices, she frequently eats larger portions than normal and she struggles with emotional eating.  Depression Screen Scherrie's Food and Mood (modified PHQ-9) score was 9.  Depression screen [facility] 2/9 01/03/2021  Decreased Interest 2  Down, Depressed, Hopeless 1  PHQ - 2 Score 3  Altered sleeping 0  Tired, decreased energy 1  Change in appetite 2  Feeling bad or failure about yourself  1  Trouble concentrating 1  Moving slowly or fidgety/restless 1  Suicidal thoughts 0  PHQ-9 Score 9  Difficult doing work/chores Not difficult at all  Some recent data might be hidden   Subjective:   1. Other fatigue Kadesia admits to daytime somnolence and denies waking up still tired. Patent has a history of symptoms of daytime fatigue. Corneisha generally gets 6 or 8 hours of sleep per night, and states that she has generally restful sleep. Snoring is present. Apneic episodes are not present. Epworth Sleepiness Score is  5.  2. Shortness of breath on exertion Bhakti notes increasing shortness of breath with exercising and seems to be worsening over time with weight gain. She notes getting out of breath sooner with activity than she used to. This has not gotten worse recently. Jaicee denies shortness of breath at rest or orthopnea.  3. Pre-diabetes Berdene has a recent A1c of 6.4. She is working on diet and weight loss. She notes polyphagia.  4. Primary hypertension Daina's blood pressure is elevated today. She is on medications and she is ready to work on diet and weight loss.  5. Anemia, unspecified type Leiana has a history of anemia. She is on iron supplement and she notes fatigue.  Assessment/Plan:   1. Other fatigue Shereese does feel that her weight is causing her energy to be lower than it should be. Fatigue may be related to obesity, depression or many other causes. Labs will be ordered, and in the meanwhile, Monique will focus on self care including making healthy food choices, increasing physical activity and focusing on stress reduction.  - EKG 12-Lead - T3 - T4 - TSH - cetirizine (ZYRTEC) 10 MG chewable tablet; Chew 10 mg by mouth daily.  2. Shortness of breath on exertion Jillisa does feel that she gets out of breath more easily that she used to when she exercises. Aalaysia's shortness of breath appears to be obesity related and exercise induced. She has agreed to work on weight loss and gradually increase exercise to treat her exercise induced shortness of breath. Will continue to monitor closely.  3. Pre-diabetes Athena will  continue to work on weight loss, exercise, and decreasing simple carbohydrates to help decrease the risk of diabetes. We will check labs today.  - Comprehensive metabolic panel - Insulin, random  4. Primary hypertension Ova is working on healthy weight loss and exercise to improve blood pressure control. We will watch for signs of hypotension as she continues her  lifestyle modifications. We will check labs today, and we will recheck her blood pressure in 2 weeks.  5. Anemia, unspecified type We will check labs today. Kylii will continue to follow up as directed. Orders and follow up as documented in patient record.  - CBC with Differential/Platelet - ferrous sulfate 325 (65 FE) MG tablet; Take 325 mg by mouth daily with breakfast.  6. Screening for depression Nettye had a positive depression screening. Depression is commonly associated with obesity and often results in emotional eating behaviors. We will monitor this closely and work on CBT to help improve the non-hunger eating patterns. Referral to Psychology may be required if no improvement is seen as she continues in our clinic.  7. Class 3 severe obesity with serious comorbidity and body mass index (BMI) of 50.0 to 59.9 in adult, unspecified obesity type (HCC) Zayden is currently in the action stage of change and her goal is to continue with weight loss efforts. I recommend Meeghan begin the structured treatment plan as follows:  She has agreed to the Category 3 Plan.  Exercise goals: No exercise has been prescribed at this time.   Behavioral modification strategies: increasing lean protein intake and no skipping meals.  She was informed of the importance of frequent follow-up visits to maximize her success with intensive lifestyle modifications for her multiple health conditions. She was informed we would discuss her lab results at her next visit unless there is a critical issue that needs to be addressed sooner. Marrion agreed to keep her next visit at the agreed upon time to discuss these results.  Objective:   Blood pressure (!) 143/73, pulse 72, temperature 98 F (36.7 C), height 5\' 3"  (1.6 m), weight (!) 324 lb (147 kg), SpO2 96 %. Body mass index is 57.39 kg/m.  EKG: Normal sinus rhythm, rate 77 BPM.  Indirect Calorimeter completed today shows a VO2 of 363 and a REE of 2529.  Her  calculated basal metabolic rate is 2831 thus her basal metabolic rate is better than expected.  General: Cooperative, alert, well developed, in no acute distress. HEENT: Conjunctivae and lids unremarkable. Cardiovascular: Regular rhythm.  Lungs: Normal work of breathing. Neurologic: No focal deficits.   Lab Results  Component Value Date   CREATININE 0.62 01/03/2021   BUN 13 01/03/2021   NA 142 01/03/2021   K 4.2 01/03/2021   CL 102 01/03/2021   CO2 23 01/03/2021   Lab Results  Component Value Date   ALT 15 01/03/2021   AST 14 01/03/2021   ALKPHOS 57 01/03/2021   BILITOT 0.3 01/03/2021   Lab Results  Component Value Date   HGBA1C 6.4 (A) 12/19/2020   HGBA1C 6.1 04/11/2020   HGBA1C 6.0 08/27/2019   HGBA1C 5.6 02/09/2018   HGBA1C 5.6 08/11/2017   Lab Results  Component Value Date   INSULIN 77.3 (H) 01/03/2021   Lab Results  Component Value Date   TSH 1.250 01/03/2021   Lab Results  Component Value Date   CHOL 209 (H) 12/19/2020   HDL 39 (L) 12/19/2020   LDLCALC 143 (H) 12/19/2020   TRIG 150 (H) 12/19/2020   CHOLHDL  5.4 (H) 12/19/2020   Lab Results  Component Value Date   WBC 8.8 01/03/2021   HGB 13.2 01/03/2021   HCT 41.4 01/03/2021   MCV 79 01/03/2021   PLT 346 01/03/2021   Lab Results  Component Value Date   IRON 32 12/03/2016   TIBC 363 12/03/2016   FERRITIN 13 (L) 02/09/2018   Attestation Statements:   Reviewed by clinician on day of visit: allergies, medications, problem list, medical history, surgical history, family history, social history, and previous encounter notes.  Time spent on visit including pre-visit chart review and post-visit charting and care was 60 minutes.    I, Trixie Dredge, am acting as transcriptionist for Dennard Nip, MD.  I have reviewed the above documentation for accuracy and completeness, and I agree with the above. - Dennard Nip, MD

## 2021-01-09 ENCOUNTER — Encounter (INDEPENDENT_AMBULATORY_CARE_PROVIDER_SITE_OTHER): Payer: Self-pay | Admitting: Family Medicine

## 2021-01-09 NOTE — Telephone Encounter (Signed)
Dr.Beasley 

## 2021-01-11 ENCOUNTER — Encounter (INDEPENDENT_AMBULATORY_CARE_PROVIDER_SITE_OTHER): Payer: Self-pay | Admitting: Family Medicine

## 2021-01-12 ENCOUNTER — Encounter (INDEPENDENT_AMBULATORY_CARE_PROVIDER_SITE_OTHER): Payer: Self-pay | Admitting: Family Medicine

## 2021-01-14 ENCOUNTER — Other Ambulatory Visit: Payer: Self-pay | Admitting: Family Medicine

## 2021-01-15 NOTE — Telephone Encounter (Signed)
Last OV with Dr. Beasley 

## 2021-01-17 ENCOUNTER — Other Ambulatory Visit: Payer: Self-pay

## 2021-01-17 ENCOUNTER — Encounter (INDEPENDENT_AMBULATORY_CARE_PROVIDER_SITE_OTHER): Payer: Self-pay | Admitting: Family Medicine

## 2021-01-17 ENCOUNTER — Ambulatory Visit (INDEPENDENT_AMBULATORY_CARE_PROVIDER_SITE_OTHER): Payer: Medicaid Other | Admitting: Family Medicine

## 2021-01-17 VITALS — BP 138/76 | HR 73 | Temp 97.9°F | Ht 63.0 in | Wt 319.0 lb

## 2021-01-17 DIAGNOSIS — E559 Vitamin D deficiency, unspecified: Secondary | ICD-10-CM

## 2021-01-17 DIAGNOSIS — R7303 Prediabetes: Secondary | ICD-10-CM | POA: Diagnosis not present

## 2021-01-17 DIAGNOSIS — Z6841 Body Mass Index (BMI) 40.0 and over, adult: Secondary | ICD-10-CM

## 2021-01-17 MED ORDER — METFORMIN HCL 500 MG PO TABS: 500.0000 mg | ORAL_TABLET | Freq: Every day | ORAL | 0 refills | Status: DC

## 2021-01-22 ENCOUNTER — Other Ambulatory Visit: Payer: Self-pay | Admitting: Family Medicine

## 2021-01-22 DIAGNOSIS — R5383 Other fatigue: Secondary | ICD-10-CM

## 2021-01-22 MED ORDER — CETIRIZINE HCL 10 MG PO CHEW: 10.0000 mg | CHEWABLE_TABLET | Freq: Every day | ORAL | 1 refills | Status: DC

## 2021-01-23 ENCOUNTER — Encounter (INDEPENDENT_AMBULATORY_CARE_PROVIDER_SITE_OTHER): Payer: Self-pay | Admitting: Family Medicine

## 2021-01-23 NOTE — Progress Notes (Signed)
Chief Complaint:   OBESITY Carla Hanson is here to discuss her progress with her obesity treatment plan along with follow-up of her obesity related diagnoses. Carla Hanson is on the Category 3 Plan and states she is following her eating plan approximately 98% of the time. Harrietta states she is doing 0 minutes 0 times per week.  Today's visit was #: 2 Starting weight: 324 lbs Starting date: 01/03/2021 Today's weight: 319 lbs Today's date: 01/17/2021 Total lbs lost to date: 5 Total lbs lost since last in-office visit: 5  Interim History: Carla Hanson has done well with weight loss on her Category 3 plan. Her huger was mostly controlled, and she did well with meal planning and prepping.  Subjective:   1. Pre-diabetes Carla Hanson's A1c is elevated at 6.4, and her fasting insulin is very elevated. She notes her polyphagia has improved on her Category 3 plan. I discussed labs with the patient today.  2. Vitamin D deficiency Carla Hanson's Vit D level is very low. She is on Vit D prescription now, and she denies any side effects. I discussed labs with the patient today.  Assessment/Plan:   1. Pre-diabetes Alessandria agreed to start metformin 500 mg q AM with food, with no refills. She will continue diet, exercise, and decreasing simple carbohydrates to help decrease the risk of diabetes.   - metFORMIN (GLUCOPHAGE) 500 MG tablet; Take 1 tablet (500 mg total) by mouth daily with breakfast.  Dispense: 30 tablet; Refill: 0  2. Vitamin D deficiency Low Vitamin D level contributes to fatigue and are associated with obesity, breast, and colon cancer. Carla Hanson agreed to continue taking prescription Vitamin D 50,000 IU every week as is, and we will recheck labs in 3 months. She will follow-up for routine testing of Vitamin D, at least 2-3 times per year to avoid over-replacement.  3. Class 3 severe obesity with serious comorbidity and body mass index (BMI) of 50.0 to 59.9 in adult, unspecified obesity type (HCC) Carla Hanson is  currently in the action stage of change. As such, her goal is to continue with weight loss efforts. She has agreed to the Category 3 Plan.   Behavioral modification strategies: increasing lean protein intake, meal planning and cooking strategies and better snacking choices.  Carla Hanson has agreed to follow-up with our clinic in 2 weeks. She was informed of the importance of frequent follow-up visits to maximize her success with intensive lifestyle modifications for her multiple health conditions.   Objective:   Blood pressure 138/76, pulse 73, temperature 97.9 F (36.6 C), height 5\' 3"  (1.6 m), weight (!) 319 lb (144.7 kg). Body mass index is 56.51 kg/m.  General: Cooperative, alert, well developed, in no acute distress. HEENT: Conjunctivae and lids unremarkable. Cardiovascular: Regular rhythm.  Lungs: Normal work of breathing. Neurologic: No focal deficits.   Lab Results  Component Value Date   CREATININE 0.62 01/03/2021   BUN 13 01/03/2021   NA 142 01/03/2021   K 4.2 01/03/2021   CL 102 01/03/2021   CO2 23 01/03/2021   Lab Results  Component Value Date   ALT 15 01/03/2021   AST 14 01/03/2021   ALKPHOS 57 01/03/2021   BILITOT 0.3 01/03/2021   Lab Results  Component Value Date   HGBA1C 6.4 (A) 12/19/2020   HGBA1C 6.1 04/11/2020   HGBA1C 6.0 08/27/2019   HGBA1C 5.6 02/09/2018   HGBA1C 5.6 08/11/2017   Lab Results  Component Value Date   INSULIN 77.3 (H) 01/03/2021   Lab Results  Component Value  Date   TSH 1.250 01/03/2021   Lab Results  Component Value Date   CHOL 209 (H) 12/19/2020   HDL 39 (L) 12/19/2020   LDLCALC 143 (H) 12/19/2020   TRIG 150 (H) 12/19/2020   CHOLHDL 5.4 (H) 12/19/2020   Lab Results  Component Value Date   WBC 8.8 01/03/2021   HGB 13.2 01/03/2021   HCT 41.4 01/03/2021   MCV 79 01/03/2021   PLT 346 01/03/2021   Lab Results  Component Value Date   IRON 32 12/03/2016   TIBC 363 12/03/2016   FERRITIN 13 (L) 02/09/2018   Attestation  Statements:   Reviewed by clinician on day of visit: allergies, medications, problem list, medical history, surgical history, family history, social history, and previous encounter notes.  Time spent on visit including pre-visit chart review and post-visit care and charting was 45 minutes.    I, Trixie Dredge, am acting as transcriptionist for Dennard Nip, MD.  I have reviewed the above documentation for accuracy and completeness, and I agree with the above. -  Dennard Nip, MD

## 2021-01-24 ENCOUNTER — Encounter (INDEPENDENT_AMBULATORY_CARE_PROVIDER_SITE_OTHER): Payer: Self-pay | Admitting: Family Medicine

## 2021-01-24 NOTE — Telephone Encounter (Signed)
Please advise 

## 2021-01-28 ENCOUNTER — Encounter: Payer: Self-pay | Admitting: Family Medicine

## 2021-01-30 ENCOUNTER — Other Ambulatory Visit: Payer: Self-pay

## 2021-01-30 ENCOUNTER — Encounter: Payer: Self-pay | Admitting: Family Medicine

## 2021-01-30 ENCOUNTER — Telehealth (INDEPENDENT_AMBULATORY_CARE_PROVIDER_SITE_OTHER): Payer: Medicaid Other | Admitting: Family Medicine

## 2021-01-30 VITALS — Wt 314.4 lb

## 2021-01-30 DIAGNOSIS — J069 Acute upper respiratory infection, unspecified: Secondary | ICD-10-CM | POA: Diagnosis not present

## 2021-01-30 MED ORDER — FLUTICASONE PROPIONATE 50 MCG/ACT NA SUSP
2.0000 | Freq: Every day | NASAL | 1 refills | Status: DC
Start: 2021-01-30 — End: 2021-04-09

## 2021-01-30 NOTE — Patient Instructions (Signed)

## 2021-01-30 NOTE — Progress Notes (Signed)
[facility] Telemedicine Visit  Patient consented to have virtual visit and was identified by name and date of birth. Method of visit: Video was attempted but was interrupted: >50% of visit completed via video  Encounter participants: Patient: Carla Hanson - located at Home Provider: Andrena Mews - located at [facility] Others (if applicable): N/A  Chief Complaint: Cough  HPI:  Cough This is a new problem. Episode onset: She started coughing about a week ago. The problem has been gradually improving. Cough characteristics: Sputum production was initially brown, but now yellow, no blood in it. Associated symptoms include nasal congestion and wheezing. Pertinent negatives include no chest pain, chills, fever or headaches. Associated symptoms comments: A little short winded. Exacerbated by: Change in weather. Grandson also has URI. She has tried a beta-agonist inhaler (Mucinex helps a bit) for the symptoms. The treatment provided moderate relief.     ROS: per HPI  Pertinent PMHx: PMX reviewed  Exam:  Wt (!) 314 lb 6.4 oz (142.6 kg)   LMP 10/28/2020   BMI 55.69 kg/m   Physical Exam Constitutional:      Appearance: Normal appearance.  Pulmonary:     Effort: Pulmonary effort is normal. No respiratory distress.  Neurological:     Mental Status: She is alert.  Psychiatric:        Mood and Affect: Mood normal.        Behavior: Behavior normal.      Assessment/Plan: Cough:  Likely viral Hx of exposure to grandson with URI symptoms. I discussed conservative measures since she is improving. May continue Mucinex for chest congestion. Flonase escribed for nasal congestion. Continue Albuterol prn SOB and Dulera for asthma. I advised she reaches out to me soon if there is no improvement of her symptoms. She agreed with the plan and verbalized understanding. No problem-specific Assessment & Plan notes found for this encounter.    Time spent during visit with  patient: 15 minutes

## 2021-01-31 ENCOUNTER — Ambulatory Visit (INDEPENDENT_AMBULATORY_CARE_PROVIDER_SITE_OTHER): Payer: Medicaid Other | Admitting: Family Medicine

## 2021-01-31 ENCOUNTER — Encounter (INDEPENDENT_AMBULATORY_CARE_PROVIDER_SITE_OTHER): Payer: Self-pay | Admitting: Family Medicine

## 2021-01-31 ENCOUNTER — Other Ambulatory Visit: Payer: Self-pay

## 2021-01-31 VITALS — BP 133/71 | HR 77 | Temp 98.8°F | Ht 63.0 in | Wt 316.0 lb

## 2021-01-31 DIAGNOSIS — Z6841 Body Mass Index (BMI) 40.0 and over, adult: Secondary | ICD-10-CM

## 2021-01-31 DIAGNOSIS — R7303 Prediabetes: Secondary | ICD-10-CM

## 2021-01-31 DIAGNOSIS — I10 Essential (primary) hypertension: Secondary | ICD-10-CM

## 2021-02-07 ENCOUNTER — Other Ambulatory Visit: Payer: Self-pay | Admitting: Family Medicine

## 2021-02-07 DIAGNOSIS — I10 Essential (primary) hypertension: Secondary | ICD-10-CM

## 2021-02-07 NOTE — Progress Notes (Signed)
Chief Complaint:   OBESITY Carla Hanson is here to discuss her progress with her obesity treatment plan along with follow-up of her obesity related diagnoses. Carla Hanson is on the Category 3 Plan and states she is following her eating plan approximately 98% of the time. Carla Hanson states she is doing weights for 10-15 minutes 4 times per week.  Today's visit was #: 3 Starting weight: 324 lbs Starting date: 01/03/2021 Today's weight: 316 lbs Today's date: 01/31/2021 Total lbs lost to date: 8 Total lbs lost since last in-office visit: 3  Interim History: Carla Hanson continues to do well with weight loss. She is starting to substitute more for breakfast as it is difficult to eat eggs all the time.  Subjective:   1. Pre-diabetes Carla Hanson started metformin, and she had cramping for 1 day but this has resolved.  2. Essential hypertension Carla Hanson's blood pressure is controlled on her medications. She denies lightheadedness, and she is working on diet and weight loss.  Assessment/Plan:   1. Pre-diabetes Carla Hanson will continue to work on weight loss, exercise, and decreasing simple carbohydrates to help decrease the risk of diabetes. We will refill metformin 500 mg q AM #30 for 1 month, and she is make sure she takes it with food.  2. Essential hypertension Carla Hanson will continue her medications as is, and will working on healthy weight loss and exercise to improve blood pressure control. We will follow closely as she continues her lifestyle modifications.  3. Class 3 severe obesity due to excess calories with serious comorbidity and body mass index (BMI) of 50.0 to 59.9 in adult Carla Hanson) Carla Hanson is currently in the action stage of change. As such, her goal is to continue with weight loss efforts. She has agreed to the Category 3 Plan with breakfast options.   Exercise goals: As is, add chair yoga.  Behavioral modification strategies: increasing lean protein intake.  Carla Hanson has agreed to follow-up with our  clinic in 2 weeks. She was informed of the importance of frequent follow-up visits to maximize her success with intensive lifestyle modifications for her multiple health conditions.   Objective:   Blood pressure 133/71, pulse 77, temperature 98.8 F (37.1 C), height 5\' 3"  (1.6 m), weight (!) 316 lb (143.3 kg). Body mass index is 55.98 kg/m.  General: Cooperative, alert, well developed, in no acute distress. HEENT: Conjunctivae and lids unremarkable. Cardiovascular: Regular rhythm.  Lungs: Normal work of breathing. Neurologic: No focal deficits.   Lab Results  Component Value Date   CREATININE 0.62 01/03/2021   BUN 13 01/03/2021   NA 142 01/03/2021   K 4.2 01/03/2021   CL 102 01/03/2021   CO2 23 01/03/2021   Lab Results  Component Value Date   ALT 15 01/03/2021   AST 14 01/03/2021   ALKPHOS 57 01/03/2021   BILITOT 0.3 01/03/2021   Lab Results  Component Value Date   HGBA1C 6.4 (A) 12/19/2020   HGBA1C 6.1 04/11/2020   HGBA1C 6.0 08/27/2019   HGBA1C 5.6 02/09/2018   HGBA1C 5.6 08/11/2017   Lab Results  Component Value Date   INSULIN 77.3 (H) 01/03/2021   Lab Results  Component Value Date   TSH 1.250 01/03/2021   Lab Results  Component Value Date   CHOL 209 (H) 12/19/2020   HDL 39 (L) 12/19/2020   LDLCALC 143 (H) 12/19/2020   TRIG 150 (H) 12/19/2020   CHOLHDL 5.4 (H) 12/19/2020   Lab Results  Component Value Date   WBC 8.8 01/03/2021  HGB 13.2 01/03/2021   HCT 41.4 01/03/2021   MCV 79 01/03/2021   PLT 346 01/03/2021   Lab Results  Component Value Date   IRON 32 12/03/2016   TIBC 363 12/03/2016   FERRITIN 13 (L) 02/09/2018   Attestation Statements:   Reviewed by clinician on day of visit: allergies, medications, problem list, medical history, surgical history, family history, social history, and previous encounter notes.   I, Trixie Dredge, am acting as transcriptionist for Dennard Nip, MD.  I have reviewed the above documentation for accuracy  and completeness, and I agree with the above. -  Dennard Nip, MD

## 2021-02-08 ENCOUNTER — Other Ambulatory Visit (INDEPENDENT_AMBULATORY_CARE_PROVIDER_SITE_OTHER): Payer: Self-pay | Admitting: Family Medicine

## 2021-02-08 DIAGNOSIS — R7303 Prediabetes: Secondary | ICD-10-CM

## 2021-02-10 ENCOUNTER — Other Ambulatory Visit (INDEPENDENT_AMBULATORY_CARE_PROVIDER_SITE_OTHER): Payer: Self-pay | Admitting: Family Medicine

## 2021-02-10 DIAGNOSIS — R7303 Prediabetes: Secondary | ICD-10-CM

## 2021-02-12 NOTE — Telephone Encounter (Signed)
Dr.Beasley 

## 2021-02-12 NOTE — Telephone Encounter (Signed)
Would you like to refill? 

## 2021-02-12 NOTE — Telephone Encounter (Signed)
Ok x 1

## 2021-02-13 ENCOUNTER — Other Ambulatory Visit (INDEPENDENT_AMBULATORY_CARE_PROVIDER_SITE_OTHER): Payer: Self-pay | Admitting: Family Medicine

## 2021-02-13 DIAGNOSIS — R7303 Prediabetes: Secondary | ICD-10-CM

## 2021-02-13 MED ORDER — METFORMIN HCL 500 MG PO TABS: 500.0000 mg | ORAL_TABLET | Freq: Every day | ORAL | 0 refills | Status: DC

## 2021-02-13 NOTE — Telephone Encounter (Signed)
Dr.Beasley 

## 2021-02-14 ENCOUNTER — Ambulatory Visit (INDEPENDENT_AMBULATORY_CARE_PROVIDER_SITE_OTHER): Payer: Medicaid Other | Admitting: Family Medicine

## 2021-02-14 ENCOUNTER — Other Ambulatory Visit: Payer: Self-pay

## 2021-02-14 ENCOUNTER — Encounter (INDEPENDENT_AMBULATORY_CARE_PROVIDER_SITE_OTHER): Payer: Self-pay | Admitting: Family Medicine

## 2021-02-14 VITALS — BP 120/77 | HR 76 | Temp 98.7°F | Ht 63.0 in | Wt 313.0 lb

## 2021-02-14 DIAGNOSIS — R7303 Prediabetes: Secondary | ICD-10-CM | POA: Diagnosis not present

## 2021-02-14 DIAGNOSIS — Z6841 Body Mass Index (BMI) 40.0 and over, adult: Secondary | ICD-10-CM | POA: Diagnosis not present

## 2021-02-14 DIAGNOSIS — E559 Vitamin D deficiency, unspecified: Secondary | ICD-10-CM | POA: Diagnosis not present

## 2021-02-14 MED ORDER — VITAMIN D 125 MCG (5000 UT) PO CAPS: 1.0000 | ORAL_CAPSULE | Freq: Every day | ORAL | 0 refills | Status: DC

## 2021-02-15 ENCOUNTER — Other Ambulatory Visit (INDEPENDENT_AMBULATORY_CARE_PROVIDER_SITE_OTHER): Payer: Self-pay | Admitting: Family Medicine

## 2021-02-15 ENCOUNTER — Other Ambulatory Visit: Payer: Self-pay | Admitting: Family Medicine

## 2021-02-15 DIAGNOSIS — J449 Chronic obstructive pulmonary disease, unspecified: Secondary | ICD-10-CM

## 2021-02-15 DIAGNOSIS — R7303 Prediabetes: Secondary | ICD-10-CM

## 2021-02-15 NOTE — Telephone Encounter (Signed)
[name] 

## 2021-02-15 NOTE — Progress Notes (Signed)
Chief Complaint:   OBESITY Carla Hanson is here to discuss her progress with her obesity treatment plan along with follow-up of her obesity related diagnoses. Carla Hanson is on the Category 3 Plan with breakfast options and states she is following her eating plan approximately 98% of the time. [name] states she is doing cardio and lifting weights for 25-33 minutes 3-4 times per week.  Today's visit was #: 4 Starting weight: 324 lbs Starting date: 01/03/2021 Today's weight: 313 lbs Today's date: 02/14/2021 Total lbs lost to date: 11 lbs Total lbs lost since last in-office visit: 3 lbs  Interim History: Carla Hanson is doing a good job with the plan.  She just started eating the protein cereal for breakfast and is enjoying this.  She notes hunger is mostly satisfied.  She loves the food on the plan.  She is quite motivated to be successful with this.  Subjective:   1. Vitamin D deficiency She is not taking prescription vitamin D because it contains pork derivatives.  Last vitamin D was very low at 13.7.  2. Pre-diabetes Last A2c was 6.4.  Diabetes runs in her family.  She is on metformin and is tolerating it well.  Lab Results  Component Value Date   HGBA1C 6.4 (A) 12/19/2020   Lab Results  Component Value Date   INSULIN 77.3 (H) 01/03/2021   Assessment/Plan:   1. Vitamin D deficiency She will start OTC vitamin D 5,000 IU daily.  She will find one that is suitable to her.  - Start Cholecalciferol (VITAMIN D) 125 MCG (5000 UT) CAPS (OTC); Take 1 capsule by mouth daily.  Dispense: 30 capsule; Refill: 0  2. Pre-diabetes Continue metformin.   3. Class 3 severe obesity with serious comorbidity and body mass index (BMI) of 50.0 to 59.9 in adult, unspecified obesity type (HCC)  Carla Hanson is currently in the action stage of change. As such, her goal is to continue with weight loss efforts. She has agreed to the Category 3 Plan.   Exercise goals: As is.  Behavioral modification strategies: meal  planning and cooking strategies.  We discussed ways to use snack calories.  Carla Hanson has agreed to follow-up with our clinic in 2 weeks with Dr. Leafy Ro, and in 4 weeks with Jake Bathe, [place].   Objective:   Blood pressure 120/77, pulse 76, temperature 98.7 F (37.1 C), height 5\' 3"  (1.6 m), weight (!) 313 lb (142 kg), SpO2 97 %. Body mass index is 55.45 kg/m.  General: Cooperative, alert, well developed, in no acute distress. HEENT: Conjunctivae and lids unremarkable. Cardiovascular: Regular rhythm.  Lungs: Normal work of breathing. Neurologic: No focal deficits.   Lab Results  Component Value Date   CREATININE 0.62 01/03/2021   BUN 13 01/03/2021   NA 142 01/03/2021   K 4.2 01/03/2021   CL 102 01/03/2021   CO2 23 01/03/2021   Lab Results  Component Value Date   ALT 15 01/03/2021   AST 14 01/03/2021   ALKPHOS 57 01/03/2021   BILITOT 0.3 01/03/2021   Lab Results  Component Value Date   HGBA1C 6.4 (A) 12/19/2020   HGBA1C 6.1 04/11/2020   HGBA1C 6.0 08/27/2019   HGBA1C 5.6 02/09/2018   HGBA1C 5.6 08/11/2017   Lab Results  Component Value Date   INSULIN 77.3 (H) 01/03/2021   Lab Results  Component Value Date   TSH 1.250 01/03/2021   Lab Results  Component Value Date   CHOL 209 (H) 12/19/2020   HDL 39 (L)  12/19/2020   LDLCALC 143 (H) 12/19/2020   TRIG 150 (H) 12/19/2020   CHOLHDL 5.4 (H) 12/19/2020   Lab Results  Component Value Date   WBC 8.8 01/03/2021   HGB 13.2 01/03/2021   HCT 41.4 01/03/2021   MCV 79 01/03/2021   PLT 346 01/03/2021   Lab Results  Component Value Date   IRON 32 12/03/2016   TIBC 363 12/03/2016   FERRITIN 13 (L) 02/09/2018   Attestation Statements:   Reviewed by clinician on day of visit: allergies, medications, problem list, medical history, surgical history, family history, social history, and previous encounter notes.  I, Water quality scientist, CMA, am acting as Location manager for [employer], [place].  I have reviewed the above  documentation for accuracy and completeness, and I agree with the above. -  Georgianne Fick, FNP

## 2021-02-15 NOTE — Telephone Encounter (Signed)
Please review

## 2021-02-19 ENCOUNTER — Encounter (INDEPENDENT_AMBULATORY_CARE_PROVIDER_SITE_OTHER): Payer: Self-pay | Admitting: Family Medicine

## 2021-02-27 ENCOUNTER — Ambulatory Visit (INDEPENDENT_AMBULATORY_CARE_PROVIDER_SITE_OTHER): Payer: Medicaid Other | Admitting: Family Medicine

## 2021-02-27 ENCOUNTER — Other Ambulatory Visit: Payer: Self-pay

## 2021-02-27 ENCOUNTER — Encounter (INDEPENDENT_AMBULATORY_CARE_PROVIDER_SITE_OTHER): Payer: Self-pay | Admitting: Family Medicine

## 2021-02-27 VITALS — HR 72 | Temp 97.8°F | Ht 63.0 in | Wt 309.0 lb

## 2021-02-27 DIAGNOSIS — Z6841 Body Mass Index (BMI) 40.0 and over, adult: Secondary | ICD-10-CM

## 2021-02-27 DIAGNOSIS — I10 Essential (primary) hypertension: Secondary | ICD-10-CM

## 2021-02-27 DIAGNOSIS — E559 Vitamin D deficiency, unspecified: Secondary | ICD-10-CM | POA: Diagnosis not present

## 2021-02-28 ENCOUNTER — Encounter (INDEPENDENT_AMBULATORY_CARE_PROVIDER_SITE_OTHER): Payer: Self-pay | Admitting: Family Medicine

## 2021-02-28 MED ORDER — VITAMIN D (ERGOCALCIFEROL) 1.25 MG (50000 UNIT) PO CAPS: 50000.0000 [IU] | ORAL_CAPSULE | ORAL | 0 refills | Status: DC

## 2021-03-01 NOTE — Progress Notes (Signed)
Chief Complaint:   OBESITY Carla Hanson is here to discuss her progress with her obesity treatment plan along with follow-up of her obesity related diagnoses. Carla Hanson is on the Category 3 Plan and states she is following her eating plan approximately 98% of the time. Carla Hanson states she is toning, light cardio, and walking for 30 minutes 3-4 times per week.  Today's visit was #: 5 Starting weight: 324 lbs Starting date: 01/03/2021 Today's weight: 309 lbs Today's date: 02/27/2021 Total lbs lost to date: 15 Total lbs lost since last in-office visit: 4  Interim History: Carla Hanson continues to do well with weight loss on her plan. Her hunger is controlled and is exercising regularly with strengthening and cardio. She is getting creative with moving her food around to fit her needs better.  Subjective:   1. Vitamin D deficiency Carla Hanson was trying to take OTC Vit D, but it has pork-related gelatin in it and she is avoiding pork.  2. Essential hypertension Carla Hanson's blood pressure is elevated today, normally under better control. She denies chest pain.  Assessment/Plan:   1. Vitamin D deficiency Low Vitamin D level contributes to fatigue and are associated with obesity, breast, and colon cancer. Carla Hanson agreed to start prescription Vitamin D 50,000 IU every week with no refills. She is to contact her pharmacy to see if gelatin is in her Vit D prescription. She will follow-up for routine testing of Vitamin D, at least 2-3 times per year to avoid over-replacement.  - Vitamin D, Ergocalciferol, (DRISDOL) 1.25 MG (50000 UNIT) CAPS capsule; Take 1 capsule (50,000 Units total) by mouth every 7 (seven) days.  Dispense: 4 capsule; Refill: 0  2. Essential hypertension Carla Hanson will continues her medications and work on diet and decreasing Na+ to improve blood pressure control. We will recheck her blood pressure in 2 weeks. She will watch for signs of hypotension as she continues her lifestyle modifications.  3.  Obesity with current BMI of 54.8 Carla Hanson is currently in the action stage of change. As such, her goal is to continue with weight loss efforts. She has agreed to the Category 3 Plan.   Lean meat equivalents were discussed.  Exercise goals: As is, add small amount of stair stepping per day.  Behavioral modification strategies: increasing lean protein intake and meal planning and cooking strategies.  Carla Hanson has agreed to follow-up with our clinic in 2 weeks. She was informed of the importance of frequent follow-up visits to maximize her success with intensive lifestyle modifications for her multiple health conditions.   Objective:   Pulse 72, temperature 97.8 F (36.6 C), height 5\' 3"  (1.6 m), weight (!) 309 lb (140.2 kg), SpO2 99 %. Body mass index is 54.74 kg/m.  General: Cooperative, alert, well developed, in no acute distress. HEENT: Conjunctivae and lids unremarkable. Cardiovascular: Regular rhythm.  Lungs: Normal work of breathing. Neurologic: No focal deficits.   Lab Results  Component Value Date   CREATININE 0.62 01/03/2021   BUN 13 01/03/2021   NA 142 01/03/2021   K 4.2 01/03/2021   CL 102 01/03/2021   CO2 23 01/03/2021   Lab Results  Component Value Date   ALT 15 01/03/2021   AST 14 01/03/2021   ALKPHOS 57 01/03/2021   BILITOT 0.3 01/03/2021   Lab Results  Component Value Date   HGBA1C 6.4 (A) 12/19/2020   HGBA1C 6.1 04/11/2020   HGBA1C 6.0 08/27/2019   HGBA1C 5.6 02/09/2018   HGBA1C 5.6 08/11/2017   Lab Results  Component Value Date   INSULIN 77.3 (H) 01/03/2021   Lab Results  Component Value Date   TSH 1.250 01/03/2021   Lab Results  Component Value Date   CHOL 209 (H) 12/19/2020   HDL 39 (L) 12/19/2020   LDLCALC 143 (H) 12/19/2020   TRIG 150 (H) 12/19/2020   CHOLHDL 5.4 (H) 12/19/2020   Lab Results  Component Value Date   WBC 8.8 01/03/2021   HGB 13.2 01/03/2021   HCT 41.4 01/03/2021   MCV 79 01/03/2021   PLT 346 01/03/2021   Lab  Results  Component Value Date   IRON 32 12/03/2016   TIBC 363 12/03/2016   FERRITIN 13 (L) 02/09/2018   Attestation Statements:   Reviewed by clinician on day of visit: allergies, medications, problem list, medical history, surgical history, family history, social history, and previous encounter notes.   I, Trixie Dredge, am acting as transcriptionist for Dennard Nip, MD.  I have reviewed the above documentation for accuracy and completeness, and I agree with the above. -  Dennard Nip, MD

## 2021-03-05 ENCOUNTER — Other Ambulatory Visit: Payer: Self-pay | Admitting: Family Medicine

## 2021-03-05 DIAGNOSIS — E559 Vitamin D deficiency, unspecified: Secondary | ICD-10-CM

## 2021-03-11 ENCOUNTER — Other Ambulatory Visit: Payer: Self-pay | Admitting: Family Medicine

## 2021-03-11 DIAGNOSIS — I10 Essential (primary) hypertension: Secondary | ICD-10-CM

## 2021-03-12 ENCOUNTER — Encounter (INDEPENDENT_AMBULATORY_CARE_PROVIDER_SITE_OTHER): Payer: Self-pay | Admitting: Family Medicine

## 2021-03-13 ENCOUNTER — Ambulatory Visit (INDEPENDENT_AMBULATORY_CARE_PROVIDER_SITE_OTHER): Payer: Medicaid Other | Admitting: Family Medicine

## 2021-03-13 ENCOUNTER — Encounter (INDEPENDENT_AMBULATORY_CARE_PROVIDER_SITE_OTHER): Payer: Self-pay

## 2021-03-13 ENCOUNTER — Other Ambulatory Visit: Payer: Self-pay

## 2021-03-13 ENCOUNTER — Ambulatory Visit (INDEPENDENT_AMBULATORY_CARE_PROVIDER_SITE_OTHER): Payer: Medicaid Other | Admitting: Physician Assistant

## 2021-03-13 VITALS — BP 112/69 | HR 71 | Temp 98.1°F | Ht 63.0 in | Wt 305.0 lb

## 2021-03-13 DIAGNOSIS — Z6841 Body Mass Index (BMI) 40.0 and over, adult: Secondary | ICD-10-CM

## 2021-03-13 DIAGNOSIS — R7303 Prediabetes: Secondary | ICD-10-CM | POA: Diagnosis not present

## 2021-03-13 DIAGNOSIS — E559 Vitamin D deficiency, unspecified: Secondary | ICD-10-CM

## 2021-03-13 MED ORDER — METFORMIN HCL 500 MG PO TABS: 500.0000 mg | ORAL_TABLET | Freq: Every day | ORAL | 0 refills | Status: DC

## 2021-03-13 NOTE — Progress Notes (Signed)
Chief Complaint:   OBESITY Carla Hanson is here to discuss her progress with her obesity treatment plan along with follow-up of her obesity related diagnoses. Carla Hanson is on the Category 3 Plan and states she is following her eating plan approximately 98% of the time. Carla Hanson states she is doing cardio and weights for 25-35 minutes 4-5 times per week.  Today's visit was #: 6 Starting weight: 324 lbs Starting date: 01/03/2021 Today's weight: 305 lbs Today's date: 03/13/2021 Total lbs lost to date: 19 Total lbs lost since last in-office visit: 4  Interim History: Aylah did very well with weight loss. She is weighing her protein daily. She is consistently working out.  Subjective:   1. Pre-diabetes Carla Hanson is on metformin, and she denies nausea, vomiting, or diarrhea. She denies polyphagia.  2. Vitamin D deficiency Carla Hanson is on Vit D weekly, and she denies nausea, vomiting, or muscle weakness.  Assessment/Plan:   1. Pre-diabetes Carla Hanson will continue to work on weight loss, exercise, and decreasing simple carbohydrates to help decrease the risk of diabetes. We will refill metformin for 1 month.  - metFORMIN (GLUCOPHAGE) 500 MG tablet; Take 1 tablet (500 mg total) by mouth daily with breakfast.  Dispense: 30 tablet; Refill: 0  2. Vitamin D deficiency Low Vitamin D level contributes to fatigue and are associated with obesity, breast, and colon cancer. Carla Hanson agreed to continue taking prescription Vitamin D 50,000 IU every week and will follow-up for routine testing of Vitamin D, at least 2-3 times per year to avoid over-replacement.  3. Class 3 severe obesity due to excess calories with serious comorbidity and body mass index (BMI) of 50.0 to 59.9 in adult [facility]) Carla Hanson is currently in the action stage of change. As such, her goal is to continue with weight loss efforts. She has agreed to the Category 3 Plan.   Exercise goals: As is.  Behavioral modification strategies: increasing lean  protein intake and meal planning and cooking strategies.  Carla Hanson has agreed to follow-up with our clinic in 2 weeks. She was informed of the importance of frequent follow-up visits to maximize her success with intensive lifestyle modifications for her multiple health conditions.   Objective:   Blood pressure 112/69, pulse 71, temperature 98.1 F (36.7 C), height 5\' 3"  (1.6 m), weight (!) 305 lb (138.3 kg), SpO2 96 %. Body mass index is 54.03 kg/m.  General: Cooperative, alert, well developed, in no acute distress. HEENT: Conjunctivae and lids unremarkable. Cardiovascular: Regular rhythm.  Lungs: Normal work of breathing. Neurologic: No focal deficits.   Lab Results  Component Value Date   CREATININE 0.62 01/03/2021   BUN 13 01/03/2021   NA 142 01/03/2021   K 4.2 01/03/2021   CL 102 01/03/2021   CO2 23 01/03/2021   Lab Results  Component Value Date   ALT 15 01/03/2021   AST 14 01/03/2021   ALKPHOS 57 01/03/2021   BILITOT 0.3 01/03/2021   Lab Results  Component Value Date   HGBA1C 6.4 (A) 12/19/2020   HGBA1C 6.1 04/11/2020   HGBA1C 6.0 08/27/2019   HGBA1C 5.6 02/09/2018   HGBA1C 5.6 08/11/2017   Lab Results  Component Value Date   INSULIN 77.3 (H) 01/03/2021   Lab Results  Component Value Date   TSH 1.250 01/03/2021   Lab Results  Component Value Date   CHOL 209 (H) 12/19/2020   HDL 39 (L) 12/19/2020   LDLCALC 143 (H) 12/19/2020   TRIG 150 (H) 12/19/2020   CHOLHDL 5.4 (  H) 12/19/2020   Lab Results  Component Value Date   WBC 8.8 01/03/2021   HGB 13.2 01/03/2021   HCT 41.4 01/03/2021   MCV 79 01/03/2021   PLT 346 01/03/2021   Lab Results  Component Value Date   IRON 32 12/03/2016   TIBC 363 12/03/2016   FERRITIN 13 (L) 02/09/2018   Attestation Statements:   Reviewed by clinician on day of visit: allergies, medications, problem list, medical history, surgical history, family history, social history, and previous encounter notes.   Wilhemena Durie, am acting as transcriptionist for [employer], PA-C.  I have reviewed the above documentation for accuracy and completeness, and I agree with the above. Abby Potash, PA-C

## 2021-03-14 ENCOUNTER — Encounter (INDEPENDENT_AMBULATORY_CARE_PROVIDER_SITE_OTHER): Payer: Self-pay | Admitting: Family Medicine

## 2021-03-19 ENCOUNTER — Encounter (INDEPENDENT_AMBULATORY_CARE_PROVIDER_SITE_OTHER): Payer: Self-pay | Admitting: Family Medicine

## 2021-03-19 NOTE — Telephone Encounter (Signed)
Please advise 

## 2021-03-19 NOTE — Telephone Encounter (Signed)
Pt last seen by Tracey Aguilar, PA-C.  

## 2021-03-28 ENCOUNTER — Encounter (INDEPENDENT_AMBULATORY_CARE_PROVIDER_SITE_OTHER): Payer: Self-pay | Admitting: Family Medicine

## 2021-03-28 ENCOUNTER — Ambulatory Visit (INDEPENDENT_AMBULATORY_CARE_PROVIDER_SITE_OTHER): Payer: Medicaid Other | Admitting: Family Medicine

## 2021-03-28 ENCOUNTER — Other Ambulatory Visit: Payer: Self-pay

## 2021-03-28 VITALS — BP 136/77 | HR 74 | Temp 98.2°F | Ht 63.0 in | Wt 303.0 lb

## 2021-03-28 DIAGNOSIS — Z6841 Body Mass Index (BMI) 40.0 and over, adult: Secondary | ICD-10-CM | POA: Diagnosis not present

## 2021-03-28 DIAGNOSIS — R7303 Prediabetes: Secondary | ICD-10-CM

## 2021-03-28 DIAGNOSIS — E559 Vitamin D deficiency, unspecified: Secondary | ICD-10-CM

## 2021-03-29 NOTE — Progress Notes (Signed)
Chief Complaint:   OBESITY Carla Hanson is here to discuss her progress with her obesity treatment plan along with follow-up of her obesity related diagnoses. [name] is on the Category 3 Plan and states she is following her eating plan approximately 98% of the time. [name] states she is doing cardio, strength, and yoga for 30 minutes 4-5 times per week.  Today's visit was #: 7 Starting weight: 324 lbs Starting date: 01/03/2021 Today's weight: 303 lbs Today's date: 03/28/2021 Total lbs lost to date: 21 Total lbs lost since last in-office visit: 2  Interim History: [name] continues to do well with weight loss. She would like more options if possible. She is staying active but she sprained her knee 2 days ago, so she is taking it easy while it heals.  Subjective:   1. Pre-diabetes Carla Hanson notes increased polyphagia in the late afternoon-evening time when her morning metformin wears off.  2. Vitamin D deficiency Carla Hanson had been on prescription Vit D, but it had gelatin in it so she found an OTC D3 that was 50,000 IU and she would like to know if this is ok.  Assessment/Plan:   1. Pre-diabetes Carla Hanson agreed to increase metformin to 500 mg BID #60 (at breakfast and lunch) with no refills. She will continue to work on weight loss, diet, exercise, and decreasing simple carbohydrates to help decrease the risk of diabetes.   2. Vitamin D deficiency Low Vitamin D level contributes to fatigue and are associated with obesity, breast, and colon cancer. Carla Hanson is to continue Vit D3 50,000 IU q weekly, and we will recheck labs in 2 months. She will follow-up for routine testing of Vitamin D, at least 2-3 times per year to avoid over-replacement.  3. Obesity with current BMI 53.8 Carla Hanson is currently in the action stage of change. As such, her goal is to continue with weight loss efforts. She has agreed to the Category 3 Plan with lunch options.   Exercise goals: As is.  Behavioral modification  strategies: increasing lean protein intake and meal planning and cooking strategies.  Carla Hanson has agreed to follow-up with our clinic in 2 to 3 weeks. She was informed of the importance of frequent follow-up visits to maximize her success with intensive lifestyle modifications for her multiple health conditions.   Objective:   Blood pressure 136/77, pulse 74, temperature 98.2 F (36.8 C), height 5\' 3"  (1.6 m), weight (!) 303 lb (137.4 kg), SpO2 96 %. Body mass index is 53.67 kg/m.  General: Cooperative, alert, well developed, in no acute distress. HEENT: Conjunctivae and lids unremarkable. Cardiovascular: Regular rhythm.  Lungs: Normal work of breathing. Neurologic: No focal deficits.   Lab Results  Component Value Date   CREATININE 0.62 01/03/2021   BUN 13 01/03/2021   NA 142 01/03/2021   K 4.2 01/03/2021   CL 102 01/03/2021   CO2 23 01/03/2021   Lab Results  Component Value Date   ALT 15 01/03/2021   AST 14 01/03/2021   ALKPHOS 57 01/03/2021   BILITOT 0.3 01/03/2021   Lab Results  Component Value Date   HGBA1C 6.4 (A) 12/19/2020   HGBA1C 6.1 04/11/2020   HGBA1C 6.0 08/27/2019   HGBA1C 5.6 02/09/2018   HGBA1C 5.6 08/11/2017   Lab Results  Component Value Date   INSULIN 77.3 (H) 01/03/2021   Lab Results  Component Value Date   TSH 1.250 01/03/2021   Lab Results  Component Value Date   CHOL 209 (H) 12/19/2020  HDL 39 (L) 12/19/2020   LDLCALC 143 (H) 12/19/2020   TRIG 150 (H) 12/19/2020   CHOLHDL 5.4 (H) 12/19/2020   Lab Results  Component Value Date   WBC 8.8 01/03/2021   HGB 13.2 01/03/2021   HCT 41.4 01/03/2021   MCV 79 01/03/2021   PLT 346 01/03/2021   Lab Results  Component Value Date   IRON 32 12/03/2016   TIBC 363 12/03/2016   FERRITIN 13 (L) 02/09/2018   Attestation Statements:   Reviewed by clinician on day of visit: allergies, medications, problem list, medical history, surgical history, family history, social history, and previous  encounter notes.   I, Trixie Dredge, am acting as transcriptionist for Dennard Nip, MD.  I have reviewed the above documentation for accuracy and completeness, and I agree with the above. -  Dennard Nip, MD

## 2021-03-30 ENCOUNTER — Encounter (INDEPENDENT_AMBULATORY_CARE_PROVIDER_SITE_OTHER): Payer: Self-pay | Admitting: Family Medicine

## 2021-04-03 ENCOUNTER — Encounter (INDEPENDENT_AMBULATORY_CARE_PROVIDER_SITE_OTHER): Payer: Self-pay | Admitting: Family Medicine

## 2021-04-03 MED ORDER — METFORMIN HCL 500 MG PO TABS: 500.0000 mg | ORAL_TABLET | Freq: Two times a day (BID) | ORAL | 0 refills | Status: DC

## 2021-04-04 ENCOUNTER — Other Ambulatory Visit (INDEPENDENT_AMBULATORY_CARE_PROVIDER_SITE_OTHER): Payer: Self-pay | Admitting: Family Medicine

## 2021-04-04 DIAGNOSIS — R7303 Prediabetes: Secondary | ICD-10-CM

## 2021-04-05 NOTE — Telephone Encounter (Signed)
Please send the patient the 100 cal snack list

## 2021-04-09 ENCOUNTER — Other Ambulatory Visit: Payer: Self-pay | Admitting: Family Medicine

## 2021-04-10 ENCOUNTER — Ambulatory Visit (INDEPENDENT_AMBULATORY_CARE_PROVIDER_SITE_OTHER): Payer: Medicaid Other | Admitting: Family Medicine

## 2021-04-10 ENCOUNTER — Other Ambulatory Visit: Payer: Self-pay

## 2021-04-10 ENCOUNTER — Encounter (INDEPENDENT_AMBULATORY_CARE_PROVIDER_SITE_OTHER): Payer: Self-pay | Admitting: Family Medicine

## 2021-04-10 VITALS — BP 115/68 | HR 73 | Temp 98.2°F | Ht 63.0 in | Wt 301.0 lb

## 2021-04-10 DIAGNOSIS — R7303 Prediabetes: Secondary | ICD-10-CM

## 2021-04-10 DIAGNOSIS — E559 Vitamin D deficiency, unspecified: Secondary | ICD-10-CM

## 2021-04-10 DIAGNOSIS — Z6841 Body Mass Index (BMI) 40.0 and over, adult: Secondary | ICD-10-CM

## 2021-04-11 ENCOUNTER — Other Ambulatory Visit (INDEPENDENT_AMBULATORY_CARE_PROVIDER_SITE_OTHER): Payer: Self-pay | Admitting: Physician Assistant

## 2021-04-12 NOTE — Telephone Encounter (Signed)
Last seen by Tracey 

## 2021-04-16 ENCOUNTER — Encounter (INDEPENDENT_AMBULATORY_CARE_PROVIDER_SITE_OTHER): Payer: Self-pay | Admitting: Family Medicine

## 2021-04-16 NOTE — Progress Notes (Signed)
Chief Complaint:   OBESITY Carla Hanson is here to discuss her progress with her obesity treatment plan along with follow-up of her obesity related diagnoses. Carla Hanson is on the Category 3 Plan and states she is following her eating plan approximately 98% of the time. Carla Hanson states she is doing Yoga, cardio, and strength training 30 minutes 6 times per week.  Today's visit was #: 7 Starting weight: 324 lbs Starting date: 01/03/2021 Today's weight: 301 lbs Today's date: 04/10/2021 Total lbs lost to date: 23 Total lbs lost since last in-office visit: 2  Interim History: Carla Hanson notes she is walking and breathing better since she has lost weight. She struggles with keeping snacks under 200 calories. Hunger is satisfied. She is doing a wonderful job with meal plan compliance and exercise.  Subjective:   1. Vitamin D deficiency Carla Hanson's Vit D is very low at 13.7. She is on weekly prescription Vit D.  2. Pre-diabetes Carla Hanson's A1c was 6.4 at last check. She is on Metformin BID and tolerating it well.  Lab Results  Component Value Date   HGBA1C 6.4 (A) 12/19/2020   Lab Results  Component Value Date   INSULIN 77.3 (H) 01/03/2021    Assessment/Plan:   1. Vitamin D deficiency She agrees to continue to take prescription Vitamin D @50 ,000 IU every week and will follow-up for routine testing of Vitamin D, at least 2-3 times per year to avoid over-replacement. -Check Vit D level at next OV.  2. Pre-diabetes  -Continue Metformin. -Check A1c at next OV.  3. Obesity with current BMI 53.33  Carla Hanson is currently in the action stage of change. As such, her goal is to continue with weight loss efforts. She has agreed to the Category 3 Plan.   Handout: 100 Calorie Snacks, 200 Calorie Snacks  Exercise goals:  As is  Behavioral modification strategies: better snacking choices.  Carla Hanson has agreed to follow-up with our clinic in 3 weeks.   Objective:   Blood pressure 115/68, pulse 73,  temperature 98.2 F (36.8 C), height 5\' 3"  (1.6 m), weight (!) 301 lb (136.5 kg), SpO2 95 %. Body mass index is 53.32 kg/m.  General: Cooperative, alert, well developed, in no acute distress. HEENT: Conjunctivae and lids unremarkable. Cardiovascular: Regular rhythm.  Lungs: Normal work of breathing. Neurologic: No focal deficits.   Lab Results  Component Value Date   CREATININE 0.62 01/03/2021   BUN 13 01/03/2021   NA 142 01/03/2021   K 4.2 01/03/2021   CL 102 01/03/2021   CO2 23 01/03/2021   Lab Results  Component Value Date   ALT 15 01/03/2021   AST 14 01/03/2021   ALKPHOS 57 01/03/2021   BILITOT 0.3 01/03/2021   Lab Results  Component Value Date   HGBA1C 6.4 (A) 12/19/2020   HGBA1C 6.1 04/11/2020   HGBA1C 6.0 08/27/2019   HGBA1C 5.6 02/09/2018   HGBA1C 5.6 08/11/2017   Lab Results  Component Value Date   INSULIN 77.3 (H) 01/03/2021   Lab Results  Component Value Date   TSH 1.250 01/03/2021   Lab Results  Component Value Date   CHOL 209 (H) 12/19/2020   HDL 39 (L) 12/19/2020   LDLCALC 143 (H) 12/19/2020   TRIG 150 (H) 12/19/2020   CHOLHDL 5.4 (H) 12/19/2020   Lab Results  Component Value Date   WBC 8.8 01/03/2021   HGB 13.2 01/03/2021   HCT 41.4 01/03/2021   MCV 79 01/03/2021   PLT 346 01/03/2021   Lab  Results  Component Value Date   IRON 32 12/03/2016   TIBC 363 12/03/2016   FERRITIN 13 (L) 02/09/2018    Attestation Statements:   Reviewed by clinician on day of visit: allergies, medications, problem list, medical history, surgical history, family history, social history, and previous encounter notes.  Coral Ceo, CMA, am acting as Location manager for [employer], [place].  I have reviewed the above documentation for accuracy and completeness, and I agree with the above. -  Georgianne Fick, FNP

## 2021-04-30 ENCOUNTER — Ambulatory Visit (INDEPENDENT_AMBULATORY_CARE_PROVIDER_SITE_OTHER): Payer: Medicaid Other | Admitting: Family Medicine

## 2021-04-30 ENCOUNTER — Other Ambulatory Visit: Payer: Self-pay

## 2021-04-30 ENCOUNTER — Encounter (INDEPENDENT_AMBULATORY_CARE_PROVIDER_SITE_OTHER): Payer: Self-pay | Admitting: Family Medicine

## 2021-04-30 VITALS — BP 134/70 | HR 67 | Temp 98.0°F | Ht 63.0 in | Wt 296.0 lb

## 2021-04-30 DIAGNOSIS — R7303 Prediabetes: Secondary | ICD-10-CM

## 2021-04-30 DIAGNOSIS — Z6841 Body Mass Index (BMI) 40.0 and over, adult: Secondary | ICD-10-CM

## 2021-04-30 DIAGNOSIS — E559 Vitamin D deficiency, unspecified: Secondary | ICD-10-CM | POA: Diagnosis not present

## 2021-04-30 MED ORDER — VITAMIN D (ERGOCALCIFEROL) 1.25 MG (50000 UNIT) PO CAPS: 50000.0000 [IU] | ORAL_CAPSULE | ORAL | 0 refills | Status: DC

## 2021-04-30 MED ORDER — METFORMIN HCL 500 MG PO TABS: 500.0000 mg | ORAL_TABLET | Freq: Two times a day (BID) | ORAL | 0 refills | Status: DC

## 2021-05-08 NOTE — Progress Notes (Signed)
Chief Complaint:   OBESITY Carla Hanson is here to discuss her progress with her obesity treatment plan along with follow-up of her obesity related diagnoses. Carla Hanson is on the Category 3 Plan and states she is following her eating plan approximately 98% of the time. Carla Hanson states she is doing cardio, yoga, and strengthening for 45 minutes 4-5 times per week.  Today's visit was #: 8 Starting weight: 324 lbs Starting date: 01/03/2021 Today's weight: 296 lbs Today's date: 04/30/2021 Total lbs lost to date: 28 Total lbs lost since last in-office visit: 5  Interim History: Carla Hanson continues to do well with weight loss. She sometimes has to move her food around to make sure she is eating all of her food. She has increased her exercise and she is hydrating well.  Subjective:   1. Pre-diabetes Carla Hanson is stable on metformin, and she notes some mild gassiness but no nausea or vomiting.  2. Vitamin D deficiency Carla Hanson is on Vit D, but her level is not yet at goal.  Assessment/Plan:   1. Pre-diabetes We will refill metformin for 1 month. Carla Hanson will continue to work on diet, exercise, and decreasing simple carbohydrates to help decrease the risk of diabetes. We will recheck labs in 2-3 weeks.  - metFORMIN (GLUCOPHAGE) 500 MG tablet; Take 1 tablet (500 mg total) by mouth 2 (two) times daily with a meal.  Dispense: 60 tablet; Refill: 0  2. Vitamin D deficiency Low Vitamin D level contributes to fatigue and are associated with obesity, breast, and colon cancer. We will refill prescription Vitamin D for 1 month, and we will recheck labs in 2-3 weeks. Carla Hanson will follow-up for routine testing of Vitamin D, at least 2-3 times per year to avoid over-replacement.  - Vitamin D, Ergocalciferol, (DRISDOL) 1.25 MG (50000 UNIT) CAPS capsule; Take 1 capsule (50,000 Units total) by mouth every 7 (seven) days.  Dispense: 4 capsule; Refill: 0  3. Obesity with current BMI 52.5 Carla Hanson is currently in the action  stage of change. As such, her goal is to continue with weight loss efforts. She has agreed to the Category 3 Plan.   Exercise goals: As is.  Behavioral modification strategies: increasing lean protein intake.  Carla Hanson has agreed to follow-up with our clinic in 2 to 3 weeks. She was informed of the importance of frequent follow-up visits to maximize her success with intensive lifestyle modifications for her multiple health conditions.   Objective:   Blood pressure 134/70, pulse 67, temperature 98 F (36.7 C), height 5\' 3"  (1.6 m), weight 296 lb (134.3 kg), SpO2 96 %. Body mass index is 52.43 kg/m.  General: Cooperative, alert, well developed, in no acute distress. HEENT: Conjunctivae and lids unremarkable. Cardiovascular: Regular rhythm.  Lungs: Normal work of breathing. Neurologic: No focal deficits.   Lab Results  Component Value Date   CREATININE 0.62 01/03/2021   BUN 13 01/03/2021   NA 142 01/03/2021   K 4.2 01/03/2021   CL 102 01/03/2021   CO2 23 01/03/2021   Lab Results  Component Value Date   ALT 15 01/03/2021   AST 14 01/03/2021   ALKPHOS 57 01/03/2021   BILITOT 0.3 01/03/2021   Lab Results  Component Value Date   HGBA1C 6.4 (A) 12/19/2020   HGBA1C 6.1 04/11/2020   HGBA1C 6.0 08/27/2019   HGBA1C 5.6 02/09/2018   HGBA1C 5.6 08/11/2017   Lab Results  Component Value Date   INSULIN 77.3 (H) 01/03/2021   Lab Results  Component Value  Date   TSH 1.250 01/03/2021   Lab Results  Component Value Date   CHOL 209 (H) 12/19/2020   HDL 39 (L) 12/19/2020   LDLCALC 143 (H) 12/19/2020   TRIG 150 (H) 12/19/2020   CHOLHDL 5.4 (H) 12/19/2020   Lab Results  Component Value Date   VD25OH 13.7 (L) 12/19/2020   Lab Results  Component Value Date   WBC 8.8 01/03/2021   HGB 13.2 01/03/2021   HCT 41.4 01/03/2021   MCV 79 01/03/2021   PLT 346 01/03/2021   Lab Results  Component Value Date   IRON 32 12/03/2016   TIBC 363 12/03/2016   FERRITIN 13 (L) 02/09/2018    Attestation Statements:   Reviewed by clinician on day of visit: allergies, medications, problem list, medical history, surgical history, family history, social history, and previous encounter notes.   I, Trixie Dredge, am acting as transcriptionist for Dennard Nip, MD.  I have reviewed the above documentation for accuracy and completeness, and I agree with the above. -  Dennard Nip, MD

## 2021-05-15 ENCOUNTER — Other Ambulatory Visit: Payer: Self-pay

## 2021-05-15 ENCOUNTER — Encounter: Payer: Self-pay | Admitting: Family Medicine

## 2021-05-15 ENCOUNTER — Ambulatory Visit (INDEPENDENT_AMBULATORY_CARE_PROVIDER_SITE_OTHER): Payer: Medicaid Other | Admitting: Family Medicine

## 2021-05-15 VITALS — BP 124/78 | HR 78 | Ht 63.0 in | Wt 298.5 lb

## 2021-05-15 DIAGNOSIS — M545 Low back pain, unspecified: Secondary | ICD-10-CM

## 2021-05-15 DIAGNOSIS — I10 Essential (primary) hypertension: Secondary | ICD-10-CM | POA: Diagnosis not present

## 2021-05-15 DIAGNOSIS — E785 Hyperlipidemia, unspecified: Secondary | ICD-10-CM

## 2021-05-15 DIAGNOSIS — E559 Vitamin D deficiency, unspecified: Secondary | ICD-10-CM

## 2021-05-15 DIAGNOSIS — Z1231 Encounter for screening mammogram for malignant neoplasm of breast: Secondary | ICD-10-CM | POA: Diagnosis not present

## 2021-05-15 DIAGNOSIS — J449 Chronic obstructive pulmonary disease, unspecified: Secondary | ICD-10-CM

## 2021-05-15 DIAGNOSIS — Z1211 Encounter for screening for malignant neoplasm of colon: Secondary | ICD-10-CM

## 2021-05-15 MED ORDER — DULERA 100-5 MCG/ACT IN AERO
INHALATION_SPRAY | RESPIRATORY_TRACT | 4 refills | Status: DC
Start: 2021-05-15 — End: 2022-01-30

## 2021-05-15 MED ORDER — BACLOFEN 10 MG PO TABS: 5.0000 mg | ORAL_TABLET | Freq: Three times a day (TID) | ORAL | 0 refills | Status: DC | PRN

## 2021-05-15 MED ORDER — ALBUTEROL SULFATE HFA 108 (90 BASE) MCG/ACT IN AERS: 2.0000 | INHALATION_SPRAY | Freq: Four times a day (QID) | RESPIRATORY_TRACT | 4 refills | Status: DC | PRN

## 2021-05-15 NOTE — Assessment & Plan Note (Signed)
Checked Vit D level today

## 2021-05-15 NOTE — Assessment & Plan Note (Signed)
I refilled her inhalers.

## 2021-05-15 NOTE — Assessment & Plan Note (Signed)
Diet control. FLP checked.

## 2021-05-15 NOTE — Assessment & Plan Note (Signed)
With radiculopathy. ?? Disc herniation. Trial muscle relaxant with Tylenol prn. Imaging if no improvement. She agreed with the plan.

## 2021-05-15 NOTE — Patient Instructions (Signed)
Back Exercises These exercises help to make your trunk and back strong. They also help to keep the lower back flexible. Doing these exercises can help to prevent back pain or lessen existing pain. If you have back pain, try to do these exercises 2-3 times each day or as told by your doctor. As you get better, do the exercises once each day. Repeat the exercises more often as told by your doctor. To stop back pain from coming back, do the exercises once each day, or as told by your doctor. Exercises Single knee to chest Do these steps 3-5 times in a row for each leg: Lie on your back on a firm bed or the floor with your legs stretched out. Bring one knee to your chest. Grab your knee or thigh with both hands and hold them it in place. Pull on your knee until you feel a gentle stretch in your lower back or buttocks. Keep doing the stretch for 10-30 seconds. Slowly let go of your leg and straighten it. Pelvic tilt Do these steps 5-10 times in a row: Lie on your back on a firm bed or the floor with your legs stretched out. Bend your knees so they point up to the ceiling. Your feet should be flat on the floor. Tighten your lower belly (abdomen) muscles to press your lower back against the floor. This will make your tailbone point up to the ceiling instead of pointing down to your feet or the floor. Stay in this position for 5-10 seconds while you gently tighten your muscles and breathe evenly. Cat-cow Do these steps until your lower back bends more easily: Get on your hands and knees on a firm surface. Keep your hands under your shoulders, and keep your knees under your hips. You may put padding under your knees. Let your head hang down toward your chest. Tighten (contract) the muscles in your belly. Point your tailbone toward the floor so your lower back becomes rounded like the back of a cat. Stay in this position for 5 seconds. Slowly lift your head. Let the muscles of your belly relax. Point  your tailbone up toward the ceiling so your back forms a sagging arch like the back of a cow. Stay in this position for 5 seconds.  Press-ups Do these steps 5-10 times in a row: Lie on your belly (face-down) on the floor. Place your hands near your head, about shoulder-width apart. While you keep your back relaxed and keep your hips on the floor, slowly straighten your arms to raise the top half of your body and lift your shoulders. Do not use your back muscles. You may change where you place your hands in order to make yourself more comfortable. Stay in this position for 5 seconds. Slowly return to lying flat on the floor.  Bridges Do these steps 10 times in a row: Lie on your back on a firm surface. Bend your knees so they point up to the ceiling. Your feet should be flat on the floor. Your arms should be flat at your sides, next to your body. Tighten your butt muscles and lift your butt off the floor until your waist is almost as high as your knees. If you do not feel the muscles working in your butt and the back of your thighs, slide your feet 1-2 inches farther away from your butt. Stay in this position for 3-5 seconds. Slowly lower your butt to the floor, and let your butt muscles relax. If  this exercise is too easy, try doing it with your arms crossed over yourchest. Belly crunches Do these steps 5-10 times in a row: Lie on your back on a firm bed or the floor with your legs stretched out. Bend your knees so they point up to the ceiling. Your feet should be flat on the floor. Cross your arms over your chest. Tip your chin a little bit toward your chest but do not bend your neck. Tighten your belly muscles and slowly raise your chest just enough to lift your shoulder blades a tiny bit off of the floor. Avoid raising your body higher than that, because it can put too much stress on your low back. Slowly lower your chest and your head to the floor. Back lifts Do these steps 5-10 times  in a row: Lie on your belly (face-down) with your arms at your sides, and rest your forehead on the floor. Tighten the muscles in your legs and your butt. Slowly lift your chest off of the floor while you keep your hips on the floor. Keep the back of your head in line with the curve in your back. Look at the floor while you do this. Stay in this position for 3-5 seconds. Slowly lower your chest and your face to the floor. Contact a doctor if: Your back pain gets a lot worse when you do an exercise. Your back pain does not get better 2 hours after you exercise. If you have any of these problems, stop doing the exercises. Do not do them again unless your doctor says it is okay. Get help right away if: You have sudden, very bad back pain. If this happens, stop doing the exercises. Do not do them again unless your doctor says it is okay. This information is not intended to replace advice given to you by your health care provider. Make sure you discuss any questions you have with your healthcare provider. Document Revised: 07/16/2018 Document Reviewed: 07/16/2018 Elsevier Patient Education  2022 [employer].

## 2021-05-15 NOTE — Progress Notes (Signed)
    SUBJECTIVE:   CHIEF COMPLAINT / HPI:   Back Pain This is a new problem. Episode onset: 2-3 weeks ago since she had a chair yoga. The problem occurs intermittently. The problem is unchanged. Pain location: Lower left back. The pain radiates to the left thigh and right knee. Pain scale: No pain now, pain is worse at the end of the day. Pain can be a 6-8/10 in severity when ever she hurts. The symptoms are aggravated by bending. Pertinent negatives include no bladder incontinence, bowel incontinence, numbness, paresthesias, tingling or weakness. She has tried ice (Tylenol) for the symptoms. The treatment provided moderate relief.   HTN/HLD: Diet controlled HLD. She is compliant with HCTZ 12.5 mg QD, and Losartan 50 mg QD for her BP.  COPD: Feels well. Need proair/ dulera refilled.   Vit D def: on OTC supplements.  HM:She is due for colon cancer screen and vaccine update. Also due for mammogram.  PERTINENT  PMH / PSH: PMX reviewed.  OBJECTIVE:   Vitals:   05/15/21 0858  BP: 124/78  Pulse: 78  SpO2: 93%  Weight: 298 lb 8 oz (135.4 kg)  Height: 5\' 3"  (1.6 m)    Physical Exam Vitals and nursing note reviewed.  Cardiovascular:     Rate and Rhythm: Normal rate and regular rhythm.     Heart sounds: Normal heart sounds. No murmur heard. Pulmonary:     Effort: Pulmonary effort is normal. No respiratory distress.     Breath sounds: Normal breath sounds. No wheezing.  Abdominal:     General: Bowel sounds are normal. There is no distension.     Palpations: Abdomen is soft. There is no mass.     Tenderness: There is no abdominal tenderness.  Musculoskeletal:     Thoracic back: Normal.     Lumbar back: Normal.     Right lower leg: No edema.     Left lower leg: No edema.  Neurological:     General: No focal deficit present.     Deep Tendon Reflexes: Reflexes are normal and symmetric.     ASSESSMENT/PLAN:   Lumbar back pain With radiculopathy. ?? Disc herniation. Trial  muscle relaxant with Tylenol prn. Imaging if no improvement. She agreed with the plan.  HYPERTENSION, BENIGN SYSTEMIC BP looks good on her current regimen. Bmet checked.  Hyperlipidemia Diet control. FLP checked.  COPD, moderate ([place]) I refilled her inhalers.   Vitamin D deficiency Checked Vit D level today   Mammogram and colon cancer screening discussed. Open to get cologuard - ordered. Mammogram ordered and slip was provided to schedule an appointment. She declined Shingrix and COVID-19 shots. She does not intend to get COVID vaccinated.  Andrena Mews, MD [place]

## 2021-05-15 NOTE — Assessment & Plan Note (Signed)
BP looks good on her current regimen. Bmet checked.

## 2021-05-16 ENCOUNTER — Telehealth: Payer: Self-pay | Admitting: Family Medicine

## 2021-05-16 LAB — LIPID PANEL
Chol/HDL Ratio: 5.7 ratio — ABNORMAL HIGH (ref 0.0–4.4)
Cholesterol, Total: 233 mg/dL — ABNORMAL HIGH (ref 100–199)
HDL: 41 mg/dL
LDL Chol Calc (NIH): 162 mg/dL — ABNORMAL HIGH (ref 0–99)
Triglycerides: 165 mg/dL — ABNORMAL HIGH (ref 0–149)
VLDL Cholesterol Cal: 30 mg/dL (ref 5–40)

## 2021-05-16 LAB — BASIC METABOLIC PANEL WITH GFR
BUN/Creatinine Ratio: 25 — ABNORMAL HIGH (ref 9–23)
BUN: 14 mg/dL (ref 6–24)
CO2: 22 mmol/L (ref 20–29)
Calcium: 9.7 mg/dL (ref 8.7–10.2)
Chloride: 100 mmol/L (ref 96–106)
Creatinine, Ser: 0.56 mg/dL — ABNORMAL LOW (ref 0.57–1.00)
Glucose: 101 mg/dL — ABNORMAL HIGH (ref 65–99)
Potassium: 4 mmol/L (ref 3.5–5.2)
Sodium: 140 mmol/L (ref 134–144)
eGFR: 108 mL/min/1.73

## 2021-05-16 LAB — VITAMIN D 25 HYDROXY (VIT D DEFICIENCY, FRACTURES): Vit D, 25-Hydroxy: 42.6 ng/mL (ref 30.0–100.0)

## 2021-05-16 NOTE — Telephone Encounter (Signed)
FLP discussed. I discussed moderate intensity statin. She'll like to discuss her diet with her weight management specialist first. She will get back to me soon.  Other labs ok.

## 2021-05-17 ENCOUNTER — Encounter (INDEPENDENT_AMBULATORY_CARE_PROVIDER_SITE_OTHER): Payer: Self-pay | Admitting: Family Medicine

## 2021-05-21 ENCOUNTER — Encounter: Payer: Self-pay | Admitting: Family Medicine

## 2021-05-21 ENCOUNTER — Other Ambulatory Visit: Payer: Self-pay

## 2021-05-21 ENCOUNTER — Ambulatory Visit (INDEPENDENT_AMBULATORY_CARE_PROVIDER_SITE_OTHER): Payer: Medicaid Other | Admitting: Family Medicine

## 2021-05-21 ENCOUNTER — Encounter (INDEPENDENT_AMBULATORY_CARE_PROVIDER_SITE_OTHER): Payer: Self-pay | Admitting: Family Medicine

## 2021-05-21 VITALS — BP 133/76 | HR 67 | Temp 98.2°F | Ht 63.0 in | Wt 293.0 lb

## 2021-05-21 DIAGNOSIS — Z6841 Body Mass Index (BMI) 40.0 and over, adult: Secondary | ICD-10-CM | POA: Diagnosis not present

## 2021-05-21 DIAGNOSIS — E785 Hyperlipidemia, unspecified: Secondary | ICD-10-CM | POA: Diagnosis not present

## 2021-05-21 DIAGNOSIS — R7303 Prediabetes: Secondary | ICD-10-CM | POA: Diagnosis not present

## 2021-05-21 DIAGNOSIS — E559 Vitamin D deficiency, unspecified: Secondary | ICD-10-CM

## 2021-05-21 MED ORDER — ROSUVASTATIN CALCIUM 10 MG PO TABS: 10.0000 mg | ORAL_TABLET | Freq: Every day | ORAL | 3 refills | Status: DC

## 2021-05-21 MED ORDER — METFORMIN HCL 500 MG PO TABS: 500.0000 mg | ORAL_TABLET | Freq: Two times a day (BID) | ORAL | 0 refills | Status: DC

## 2021-05-21 NOTE — Progress Notes (Signed)
The 10-year ASCVD risk score Mikey Bussing DC Brooke Bonito., et al., 2013) is: 18.5%   Values used to calculate the score:     Age: 55 years     Sex: Female     Is Non-Hispanic African American: Yes     Diabetic: Yes     Tobacco smoker: No     Systolic Blood Pressure: 570 mmHg     Is BP treated: Yes     HDL Cholesterol: 41 mg/dL     Total Cholesterol: 233 mg/dL

## 2021-05-21 NOTE — Telephone Encounter (Signed)
Last OV with Dr. Beasley 

## 2021-05-22 ENCOUNTER — Encounter (INDEPENDENT_AMBULATORY_CARE_PROVIDER_SITE_OTHER): Payer: Self-pay | Admitting: Family Medicine

## 2021-05-22 LAB — INSULIN, RANDOM: INSULIN: 64.3 u[IU]/mL — ABNORMAL HIGH (ref 2.6–24.9)

## 2021-05-22 LAB — HEMOGLOBIN A1C
Est. average glucose Bld gHb Est-mCnc: 134 mg/dL
Hgb A1c MFr Bld: 6.3 % — ABNORMAL HIGH (ref 4.8–5.6)

## 2021-05-22 NOTE — Telephone Encounter (Signed)
fyi

## 2021-05-22 NOTE — Telephone Encounter (Signed)
Please advise 

## 2021-05-23 DIAGNOSIS — Z1211 Encounter for screening for malignant neoplasm of colon: Secondary | ICD-10-CM | POA: Diagnosis not present

## 2021-05-24 NOTE — Progress Notes (Signed)
Chief Complaint:   OBESITY Carla Hanson is here to discuss her progress with her obesity treatment plan along with follow-up of her obesity related diagnoses. Carla Hanson is on the Category 3 Plan and states she is following her eating plan approximately 98% of the time. Carla Hanson states she is doing yoga,seated cardio, HITand weights 45 minutes 6 times per week.  Today's visit was #: 9 Starting weight: 324 lbs Starting date: 01/03/2021 Today's weight: 293 lbs Today's date: 05/21/2021 Total lbs lost to date: 31 lbs Total lbs lost since last in-office visit: 3 lbs  Interim History: Carla Hanson is concerned about her elevated cholesterol. She had adhered to the plan very well and is exercising consistently. She is eating fruit for  snack. She avoids red meat.  Subjective:   1. Hyperlipidemia, unspecified hyperlipidemia type Carla Hanson's LDL is above goal at 162. It is up from 143 in Feb. 2020. Her HDL is low at 41. Her Triglycerides are elevated at 165. She is not on statin. Her ASCVD rise score 8.5%. She has a history of hemorrhagic stroke.  Lab Results  Component Value Date   CHOL 233 (H) 05/15/2021   HDL 41 05/15/2021   LDLCALC 162 (H) 05/15/2021   TRIG 165 (H) 05/15/2021   CHOLHDL 5.7 (H) 05/15/2021   Lab Results  Component Value Date   ALT 15 01/03/2021   AST 14 01/03/2021   ALKPHOS 57 01/03/2021   BILITOT 0.3 01/03/2021   The 10-year ASCVD risk score Carla Hanson DC Jr., et al., 2013) is: 18.5%   Values used to calculate the score:     Age: 55 years     Sex: Female     Is Non-Hispanic African American: Yes     Diabetic: Yes     Tobacco smoker: No     Systolic Blood Pressure: [id number] mmHg     Is BP treated: Yes     HDL Cholesterol: 41 mg/dL     Total Cholesterol: 233 mg/dL   2. Pre-diabetes Carla Hanson's A1C was 6.4 (12-19-2020). She is on Metformin twice daily.  Lab Results  Component Value Date   HGBA1C 6.3 (H) 05/21/2021   Lab Results  Component Value Date   INSULIN 64.3 (H) 05/21/2021    INSULIN 77.3 (H) 01/03/2021    3. Vitamin D deficiency  Lab Results  Component Value Date   VD25OH 42.6 05/15/2021   VD25OH 13.7 (L) 12/19/2020    Assessment/Plan:   1. Hyperlipidemia, unspecified hyperlipidemia type Carla Hanson agreed to start Crestor 10 mg daily 90 tablets. She will increase MUFA, PUFA, fiber, salmon and tuna. Cardiovascular risk and specific lipid/LDL goals reviewed.  We discussed several lifestyle modifications today and Akeema will continue to work on diet, exercise and weight loss efforts.   Start: - rosuvastatin (CRESTOR) 10 MG tablet; Take 1 tablet (10 mg total) by mouth daily.  Dispense: 90 tablet; Refill: 3  2. Pre-diabetes We will check labs today.   - metFORMIN (GLUCOPHAGE) 500 MG tablet; Take 1 tablet (500 mg total) by mouth 2 (two) times daily with a meal.  Dispense: 60 tablet; Refill: 0 - Hemoglobin A1c - Insulin, random  3. Vitamin D deficiency  She agrees to continue to take prescription Vitamin D '@50'$ ,000 IU every week and will follow-up for routine testing of Vitamin D, at least 2-3 times per year to avoid over-replacement.   4. Obesity with current BMI 51.92 Carla Hanson is currently in the action stage of change. As such, her goal is to continue with  weight loss efforts. She has agreed to the Category 3 Plan.   Exercise goals:  As is.  Behavioral modification strategies: planning for success.  Carla Hanson has agreed to follow-up with our clinic in 2 weeks.  Carla Hanson was informed we would discuss her lab results at her next visit unless there is a critical issue that needs to be addressed sooner. Carla Hanson agreed to keep her next visit at the agreed upon time to discuss these results.  Objective:   Blood pressure 133/76, pulse 67, temperature 98.2 F (36.8 C), height '5\' 3"'$  (1.6 m), weight 293 lb (132.9 kg), SpO2 99 %. Body mass index is 51.9 kg/m.  General: Cooperative, alert, well developed, in no acute distress. HEENT: Conjunctivae and lids  unremarkable. Cardiovascular: Regular rhythm.  Lungs: Normal work of breathing. Neurologic: No focal deficits.   Lab Results  Component Value Date   CREATININE 0.56 (L) 05/15/2021   BUN 14 05/15/2021   NA 140 05/15/2021   K 4.0 05/15/2021   CL 100 05/15/2021   CO2 22 05/15/2021   Lab Results  Component Value Date   ALT 15 01/03/2021   AST 14 01/03/2021   ALKPHOS 57 01/03/2021   BILITOT 0.3 01/03/2021   Lab Results  Component Value Date   HGBA1C 6.3 (H) 05/21/2021   HGBA1C 6.4 (A) 12/19/2020   HGBA1C 6.1 04/11/2020   HGBA1C 6.0 08/27/2019   HGBA1C 5.6 02/09/2018   Lab Results  Component Value Date   INSULIN 64.3 (H) 05/21/2021   INSULIN 77.3 (H) 01/03/2021   Lab Results  Component Value Date   TSH 1.250 01/03/2021   Lab Results  Component Value Date   CHOL 233 (H) 05/15/2021   HDL 41 05/15/2021   LDLCALC 162 (H) 05/15/2021   TRIG 165 (H) 05/15/2021   CHOLHDL 5.7 (H) 05/15/2021   Lab Results  Component Value Date   VD25OH 42.6 05/15/2021   VD25OH 13.7 (L) 12/19/2020   Lab Results  Component Value Date   WBC 8.8 01/03/2021   HGB 13.2 01/03/2021   HCT 41.4 01/03/2021   MCV 79 01/03/2021   PLT 346 01/03/2021   Lab Results  Component Value Date   IRON 32 12/03/2016   TIBC 363 12/03/2016   FERRITIN 13 (L) 02/09/2018   Attestation Statements:   Reviewed by clinician on day of visit: allergies, medications, problem list, medical history, surgical history, family history, social history, and previous encounter notes.  I, Lizbeth Bark, RMA, am acting as Location manager for [employer], [place].  I have reviewed the above documentation for accuracy and completeness, and I agree with the above. -  Georgianne Fick, FNP

## 2021-05-28 LAB — COLOGUARD: Cologuard: NEGATIVE

## 2021-06-04 ENCOUNTER — Other Ambulatory Visit: Payer: Self-pay

## 2021-06-04 ENCOUNTER — Encounter (INDEPENDENT_AMBULATORY_CARE_PROVIDER_SITE_OTHER): Payer: Self-pay | Admitting: Family Medicine

## 2021-06-04 ENCOUNTER — Ambulatory Visit (INDEPENDENT_AMBULATORY_CARE_PROVIDER_SITE_OTHER): Payer: Medicaid Other | Admitting: Family Medicine

## 2021-06-04 VITALS — BP 102/70 | HR 60 | Temp 98.0°F | Ht 63.0 in | Wt 293.0 lb

## 2021-06-04 DIAGNOSIS — Z6841 Body Mass Index (BMI) 40.0 and over, adult: Secondary | ICD-10-CM

## 2021-06-04 DIAGNOSIS — E782 Mixed hyperlipidemia: Secondary | ICD-10-CM

## 2021-06-04 DIAGNOSIS — E559 Vitamin D deficiency, unspecified: Secondary | ICD-10-CM

## 2021-06-04 DIAGNOSIS — R7303 Prediabetes: Secondary | ICD-10-CM

## 2021-06-05 ENCOUNTER — Other Ambulatory Visit: Payer: Self-pay | Admitting: Family Medicine

## 2021-06-05 DIAGNOSIS — I10 Essential (primary) hypertension: Secondary | ICD-10-CM

## 2021-06-05 NOTE — Telephone Encounter (Signed)
Please advise 

## 2021-06-06 MED ORDER — METFORMIN HCL 500 MG PO TABS: 500.0000 mg | ORAL_TABLET | Freq: Two times a day (BID) | ORAL | 0 refills | Status: DC

## 2021-06-06 NOTE — Progress Notes (Signed)
Chief Complaint:   OBESITY Carla Hanson is here to discuss her progress with her obesity treatment plan along with follow-up of her obesity related diagnoses. Carla Hanson is on the Category 3 Plan and states she is following her eating plan approximately 98% of the time. Carla Hanson states she is doing yoga, cardio, and walking for 45 minutes 6 times per week.  Today's visit was #: 10 Starting weight: 324 lbs Starting date: 01/03/2021 Today's weight: 293 lbs Today's date: 06/04/2021 Total lbs lost to date: 31 Total lbs lost since last in-office visit: 0  Interim History: Carla Hanson has had a stressful few weeks with 3 deaths in the family. She hasn't followed her plan as well due to this but remained mindful and was able to maintained her weight.  Subjective:   1. Pre-diabetes Carla Hanson's A1c is starting to improve with diet and metformin. She denies nausea, vomiting, and hypoglycemia. I discussed labs with the patient today.  2. Mixed hyperlipidemia Carla Hanson recently started Crestor, but her labs are not yet improving. No chest pain or myalgias noted. She has questions about cholesterol in foods. I discussed labs with the patient today.  Assessment/Plan:   1. Pre-diabetes Carla Hanson will continue to work on weight loss, exercise, and decreasing simple carbohydrates to help decrease the risk of diabetes. We will refill metformin 500 mg BID #60 for 1 month.  2. Mixed hyperlipidemia Cardiovascular risk and specific lipid/LDL goals reviewed. We discussed several lifestyle modifications today. Carla Hanson is on a low cholesterol, lower fat diet and she is doing well. She is stable on Crestor 10 mg, and will continue as is. We will recheck labs in 3 months.    3. Obesity with current BMI 51.9 Carla Hanson is currently in the action stage of change. As such, her goal is to continue with weight loss efforts. She has agreed to the Category 3 Plan.   Exercise goals: As is.  Behavioral modification strategies: no skipping  meals.  Carla Hanson has agreed to follow-up with our clinic in 3 weeks. She was informed of the importance of frequent follow-up visits to maximize her success with intensive lifestyle modifications for her multiple health conditions.   Objective:   Blood pressure 102/70, pulse 60, temperature 98 F (36.7 C), height '5\' 3"'$  (1.6 m), weight 293 lb (132.9 kg), SpO2 96 %. Body mass index is 51.9 kg/m.  General: Cooperative, alert, well developed, in no acute distress. HEENT: Conjunctivae and lids unremarkable. Cardiovascular: Regular rhythm.  Lungs: Normal work of breathing. Neurologic: No focal deficits.   Lab Results  Component Value Date   CREATININE 0.56 (L) 05/15/2021   BUN 14 05/15/2021   NA 140 05/15/2021   K 4.0 05/15/2021   CL 100 05/15/2021   CO2 22 05/15/2021   Lab Results  Component Value Date   ALT 15 01/03/2021   AST 14 01/03/2021   ALKPHOS 57 01/03/2021   BILITOT 0.3 01/03/2021   Lab Results  Component Value Date   HGBA1C 6.3 (H) 05/21/2021   HGBA1C 6.4 (A) 12/19/2020   HGBA1C 6.1 04/11/2020   HGBA1C 6.0 08/27/2019   HGBA1C 5.6 02/09/2018   Lab Results  Component Value Date   INSULIN 64.3 (H) 05/21/2021   INSULIN 77.3 (H) 01/03/2021   Lab Results  Component Value Date   TSH 1.250 01/03/2021   Lab Results  Component Value Date   CHOL 233 (H) 05/15/2021   HDL 41 05/15/2021   LDLCALC 162 (H) 05/15/2021   TRIG 165 (H) 05/15/2021  CHOLHDL 5.7 (H) 05/15/2021   Lab Results  Component Value Date   VD25OH 42.6 05/15/2021   VD25OH 13.7 (L) 12/19/2020   Lab Results  Component Value Date   WBC 8.8 01/03/2021   HGB 13.2 01/03/2021   HCT 41.4 01/03/2021   MCV 79 01/03/2021   PLT 346 01/03/2021   Lab Results  Component Value Date   IRON 32 12/03/2016   TIBC 363 12/03/2016   FERRITIN 13 (L) 02/09/2018   Attestation Statements:   Reviewed by clinician on day of visit: allergies, medications, problem list, medical history, surgical history, family  history, social history, and previous encounter notes.   I, Trixie Dredge, am acting as transcriptionist for Dennard Nip, MD.  I have reviewed the above documentation for accuracy and completeness, and I agree with the above. -  Dennard Nip, MD

## 2021-06-18 ENCOUNTER — Encounter (INDEPENDENT_AMBULATORY_CARE_PROVIDER_SITE_OTHER): Payer: Self-pay | Admitting: Family Medicine

## 2021-06-18 ENCOUNTER — Ambulatory Visit (INDEPENDENT_AMBULATORY_CARE_PROVIDER_SITE_OTHER): Payer: Medicaid Other | Admitting: Family Medicine

## 2021-06-18 ENCOUNTER — Other Ambulatory Visit: Payer: Self-pay

## 2021-06-18 VITALS — BP 128/83 | HR 75 | Temp 98.5°F | Ht 63.0 in | Wt 290.0 lb

## 2021-06-18 DIAGNOSIS — Z6841 Body Mass Index (BMI) 40.0 and over, adult: Secondary | ICD-10-CM

## 2021-06-18 DIAGNOSIS — R7303 Prediabetes: Secondary | ICD-10-CM | POA: Diagnosis not present

## 2021-06-18 NOTE — Progress Notes (Signed)
Chief Complaint:   OBESITY Carla Hanson is here to discuss her progress with her obesity treatment plan along with follow-up of her obesity related diagnoses. Carla Hanson is on the Category 3 Plan and states she is following her eating plan approximately 98% of the time. Carla Hanson states she is doing yoga, cardio and walking for 45 minutes 6 times per week.  Today's visit was #: 11 Starting weight: 324 lbs Starting date: 01/03/2021 Today's weight: 290 lbs Today's date: 06/18/2021 Total lbs lost to date: 34 lbs Total lbs lost since last in-office visit: 6 lbs  Interim History: Carla Hanson has been dealing with recent deaths in her family. However, she has adhered well to the plan. She diverts herself when stressed and takes a walk rather than eating. Her hunger is well satisfied. She swaps lunch and dinner.  Subjective:   1. Pre-diabetes Carla Hanson's last A1C was 6.3 on Metformin twice daily and she is tolerating it well.  Lab Results  Component Value Date   HGBA1C 6.3 (H) 05/21/2021   Lab Results  Component Value Date   INSULIN 64.3 (H) 05/21/2021   INSULIN 77.3 (H) 01/03/2021    Assessment/Plan:   1. Pre-diabetes Carla Hanson will continue Metformin. She will continue to work on weight loss, exercise, and decreasing simple carbohydrates to help decrease the risk of diabetes.    2. Obesity with current BMI 51.38 Carla Hanson is currently in the action stage of change. As such, her goal is to continue with weight loss efforts. She has agreed to the Category 3 Plan.   Exercise goals:  As is.  Behavioral modification strategies: planning for success.  Carla Hanson has agreed to follow-up with our clinic in 3 weeks with Dr. Leafy Ro.  Objective:   Blood pressure 128/83, pulse 75, temperature 98.5 F (36.9 C), height '5\' 3"'$  (1.6 m), weight 290 lb (131.5 kg), SpO2 95 %. Body mass index is 51.37 kg/m.  General: Cooperative, alert, well developed, in no acute distress. HEENT: Conjunctivae and lids  unremarkable. Cardiovascular: Regular rhythm.  Lungs: Normal work of breathing. Neurologic: No focal deficits.   Lab Results  Component Value Date   CREATININE 0.56 (L) 05/15/2021   BUN 14 05/15/2021   NA 140 05/15/2021   K 4.0 05/15/2021   CL 100 05/15/2021   CO2 22 05/15/2021   Lab Results  Component Value Date   ALT 15 01/03/2021   AST 14 01/03/2021   ALKPHOS 57 01/03/2021   BILITOT 0.3 01/03/2021   Lab Results  Component Value Date   HGBA1C 6.3 (H) 05/21/2021   HGBA1C 6.4 (A) 12/19/2020   HGBA1C 6.1 04/11/2020   HGBA1C 6.0 08/27/2019   HGBA1C 5.6 02/09/2018   Lab Results  Component Value Date   INSULIN 64.3 (H) 05/21/2021   INSULIN 77.3 (H) 01/03/2021   Lab Results  Component Value Date   TSH 1.250 01/03/2021   Lab Results  Component Value Date   CHOL 233 (H) 05/15/2021   HDL 41 05/15/2021   LDLCALC 162 (H) 05/15/2021   TRIG 165 (H) 05/15/2021   CHOLHDL 5.7 (H) 05/15/2021   Lab Results  Component Value Date   VD25OH 42.6 05/15/2021   VD25OH 13.7 (L) 12/19/2020   Lab Results  Component Value Date   WBC 8.8 01/03/2021   HGB 13.2 01/03/2021   HCT 41.4 01/03/2021   MCV 79 01/03/2021   PLT 346 01/03/2021   Lab Results  Component Value Date   IRON 32 12/03/2016   TIBC 363 12/03/2016  FERRITIN 13 (L) 02/09/2018   Attestation Statements:   Reviewed by clinician on day of visit: allergies, medications, problem list, medical history, surgical history, family history, social history, and previous encounter notes.   I, Lizbeth Bark, RMA, am acting as Location manager for [employer], [place].   I have reviewed the above documentation for accuracy and completeness, and I agree with the above. -  Georgianne Fick, FNP

## 2021-06-19 ENCOUNTER — Encounter (INDEPENDENT_AMBULATORY_CARE_PROVIDER_SITE_OTHER): Payer: Self-pay | Admitting: Family Medicine

## 2021-07-01 ENCOUNTER — Encounter (INDEPENDENT_AMBULATORY_CARE_PROVIDER_SITE_OTHER): Payer: Self-pay | Admitting: Family Medicine

## 2021-07-02 ENCOUNTER — Encounter (INDEPENDENT_AMBULATORY_CARE_PROVIDER_SITE_OTHER): Payer: Self-pay

## 2021-07-02 ENCOUNTER — Other Ambulatory Visit (INDEPENDENT_AMBULATORY_CARE_PROVIDER_SITE_OTHER): Payer: Self-pay | Admitting: Family Medicine

## 2021-07-02 DIAGNOSIS — R7303 Prediabetes: Secondary | ICD-10-CM

## 2021-07-02 NOTE — Telephone Encounter (Signed)
Pt last seen by Dr. Beasley.  

## 2021-07-02 NOTE — Telephone Encounter (Signed)
MyChart message sent to pt to find out if they have enough medication to get them through until next appt.   

## 2021-07-02 NOTE — Telephone Encounter (Signed)
Carla Hanson 

## 2021-07-03 NOTE — Telephone Encounter (Signed)
LAST APPOINTMENT DATE: 06/21/21 NEXT APPOINTMENT DATE: 07/10/21   [facility] [place], [place] [address] Phone: [phone] Fax: [phone]  [facility] [place], [address] AT [address] [place] [address] Phone: [phone] Fax: [phone], [place] [address] [place] [address] Phone: [phone] Fax: [phone]  Patient is requesting a refill of the following medications: Pending Prescriptions:                       Disp   Refills   metFORMIN (GLUCOPHAGE) 500 MG tablet [Phar*60 tab*0       Sig: TAKE 1 TABLET(500 MG) BY MOUTH TWICE DAILY WITH A          MEAL   Date last filled: 06/06/21 Previously prescribed by [facility]  Lab Results      Component                Value               Date                      HGBA1C                   6.3 (H)             05/21/2021                HGBA1C                   6.4 (A)             12/19/2020                HGBA1C                   6.1                 04/11/2020           Lab Results      Component                Value               Date                      LDLCALC                  162 (H)             05/15/2021                CREATININE               0.56 (L)            05/15/2021           Lab Results      Component                Value               Date                      VD25OH  42.6                05/15/2021                VD25OH                   13.7 (L)            12/19/2020            BP Readings from Last 3 Encounters: 06/18/21 : 128/83 06/04/21 : 102/70 05/21/21 : 133/76

## 2021-07-10 ENCOUNTER — Ambulatory Visit (INDEPENDENT_AMBULATORY_CARE_PROVIDER_SITE_OTHER): Payer: Medicaid Other | Admitting: Family Medicine

## 2021-07-10 ENCOUNTER — Other Ambulatory Visit: Payer: Self-pay

## 2021-07-10 ENCOUNTER — Encounter (INDEPENDENT_AMBULATORY_CARE_PROVIDER_SITE_OTHER): Payer: Self-pay | Admitting: Family Medicine

## 2021-07-10 VITALS — BP 131/59 | HR 66 | Temp 98.0°F | Ht 63.0 in | Wt 290.0 lb

## 2021-07-10 DIAGNOSIS — R7303 Prediabetes: Secondary | ICD-10-CM | POA: Diagnosis not present

## 2021-07-10 DIAGNOSIS — Z6841 Body Mass Index (BMI) 40.0 and over, adult: Secondary | ICD-10-CM | POA: Diagnosis not present

## 2021-07-10 DIAGNOSIS — I10 Essential (primary) hypertension: Secondary | ICD-10-CM | POA: Diagnosis not present

## 2021-07-10 DIAGNOSIS — E785 Hyperlipidemia, unspecified: Secondary | ICD-10-CM | POA: Diagnosis not present

## 2021-07-10 MED ORDER — METFORMIN HCL 500 MG PO TABS: ORAL_TABLET | ORAL | 0 refills | Status: DC

## 2021-07-10 NOTE — Progress Notes (Signed)
Chief Complaint:   OBESITY Carla Hanson is here to discuss her progress with her obesity treatment plan along with follow-up of her obesity related diagnoses. Carla Hanson is on the Category 3 Plan and states she is following her eating plan approximately 98% of the time. Carla Hanson states she is doing cardio and weights for 30-45 minutes 6 times per week.  Today's visit was #: 12 Starting weight: 324 lbs Starting date: 01/03/2021 Today's weight: 290 lbs Today's date: 07/10/2021 Total lbs lost to date: 34 Total lbs lost since last in-office visit: 0  Interim History: Carla Hanson has done well maintaining her weight. She is trying to follow her Category 3 plan. She sometimes misses eating all of her food and her hunger has increased.  Subjective:   1. Essential hypertension Carla Hanson's blood pressure is well controlled on her medications. She continues to work on her eating plan and working on diet.  2. Hyperlipidemia, unspecified hyperlipidemia type Carla Hanson is now on Crestor in addition to diet and exercise. No chest pain or myalgias were noted.  3. Pre-diabetes Carla Hanson is doing well on metformin BID, and she denies nausea or vomiting. She is still struggling with polyphagia.  Assessment/Plan:   1. Essential hypertension Carla Hanson will continue with her medications, and diet to improve blood pressure control. We will watch for signs of hypotension as she continues her lifestyle modifications.  2. Hyperlipidemia, unspecified hyperlipidemia type Cardiovascular risk and specific lipid/LDL goals reviewed. We discussed several lifestyle modifications today. Carla Hanson will continue as and will continue to follow up as directed. Orders and follow up as documented in patient record.   3. Pre-diabetes Carla Hanson will continue to work on weight loss, exercise, and decreasing simple carbohydrates to help decrease the risk of diabetes. We will refill metformin for 1 month.  - metFORMIN (GLUCOPHAGE) 500 MG tablet; TAKE 1  TABLET(500 MG) BY MOUTH TWICE DAILY WITH A MEAL  Dispense: 60 tablet; Refill: 0  4. Obesity with current BMI 51.4 Carla Hanson is currently in the action stage of change. As such, her goal is to continue with weight loss efforts. She has agreed to the Category 3 Plan.   Exercise goals: As is.  Behavioral modification strategies: no skipping meals and meal planning and cooking strategies.  Carla Hanson has agreed to follow-up with our clinic in 3 to 4 weeks. She was informed of the importance of frequent follow-up visits to maximize her success with intensive lifestyle modifications for her multiple health conditions.   Objective:   Blood pressure (!) 131/59, pulse 66, temperature 98 F (36.7 C), height '5\' 3"'$  (1.6 m), weight 290 lb (131.5 kg), SpO2 96 %. Body mass index is 51.37 kg/m.  General: Cooperative, alert, well developed, in no acute distress. HEENT: Conjunctivae and lids unremarkable. Cardiovascular: Regular rhythm.  Lungs: Normal work of breathing. Neurologic: No focal deficits.   Lab Results  Component Value Date   CREATININE 0.56 (L) 05/15/2021   BUN 14 05/15/2021   NA 140 05/15/2021   K 4.0 05/15/2021   CL 100 05/15/2021   CO2 22 05/15/2021   Lab Results  Component Value Date   ALT 15 01/03/2021   AST 14 01/03/2021   ALKPHOS 57 01/03/2021   BILITOT 0.3 01/03/2021   Lab Results  Component Value Date   HGBA1C 6.3 (H) 05/21/2021   HGBA1C 6.4 (A) 12/19/2020   HGBA1C 6.1 04/11/2020   HGBA1C 6.0 08/27/2019   HGBA1C 5.6 02/09/2018   Lab Results  Component Value Date   INSULIN 64.3 (  H) 05/21/2021   INSULIN 77.3 (H) 01/03/2021   Lab Results  Component Value Date   TSH 1.250 01/03/2021   Lab Results  Component Value Date   CHOL 233 (H) 05/15/2021   HDL 41 05/15/2021   LDLCALC 162 (H) 05/15/2021   TRIG 165 (H) 05/15/2021   CHOLHDL 5.7 (H) 05/15/2021   Lab Results  Component Value Date   VD25OH 42.6 05/15/2021   VD25OH 13.7 (L) 12/19/2020   Lab Results   Component Value Date   WBC 8.8 01/03/2021   HGB 13.2 01/03/2021   HCT 41.4 01/03/2021   MCV 79 01/03/2021   PLT 346 01/03/2021   Lab Results  Component Value Date   IRON 32 12/03/2016   TIBC 363 12/03/2016   FERRITIN 13 (L) 02/09/2018   Attestation Statements:   Reviewed by clinician on day of visit: allergies, medications, problem list, medical history, surgical history, family history, social history, and previous encounter notes.  Time spent on visit including pre-visit chart review and post-visit care and charting was 32 minutes.    I, Trixie Dredge, am acting as transcriptionist for Dennard Nip, MD.  I have reviewed the above documentation for accuracy and completeness, and I agree with the above. -  Dennard Nip, MD

## 2021-07-18 ENCOUNTER — Encounter (INDEPENDENT_AMBULATORY_CARE_PROVIDER_SITE_OTHER): Payer: Self-pay | Admitting: Family Medicine

## 2021-07-25 ENCOUNTER — Encounter: Payer: Self-pay | Admitting: Family Medicine

## 2021-07-25 ENCOUNTER — Other Ambulatory Visit: Payer: Self-pay | Admitting: Family Medicine

## 2021-07-25 DIAGNOSIS — J309 Allergic rhinitis, unspecified: Secondary | ICD-10-CM | POA: Insufficient documentation

## 2021-07-26 ENCOUNTER — Other Ambulatory Visit: Payer: Self-pay

## 2021-07-26 ENCOUNTER — Ambulatory Visit (INDEPENDENT_AMBULATORY_CARE_PROVIDER_SITE_OTHER): Payer: Medicaid Other | Admitting: Family Medicine

## 2021-07-26 ENCOUNTER — Encounter (INDEPENDENT_AMBULATORY_CARE_PROVIDER_SITE_OTHER): Payer: Self-pay | Admitting: Family Medicine

## 2021-07-26 VITALS — BP 133/83 | HR 68 | Temp 98.1°F | Ht 63.0 in | Wt 292.0 lb

## 2021-07-26 DIAGNOSIS — R7303 Prediabetes: Secondary | ICD-10-CM | POA: Diagnosis not present

## 2021-07-26 DIAGNOSIS — Z6841 Body Mass Index (BMI) 40.0 and over, adult: Secondary | ICD-10-CM | POA: Diagnosis not present

## 2021-07-26 DIAGNOSIS — I10 Essential (primary) hypertension: Secondary | ICD-10-CM | POA: Diagnosis not present

## 2021-07-26 MED ORDER — METFORMIN HCL 500 MG PO TABS
ORAL_TABLET | ORAL | 0 refills | Status: DC
Start: 2021-07-26 — End: 2021-08-23

## 2021-07-26 NOTE — Progress Notes (Signed)
Chief Complaint:   OBESITY Carla Hanson is here to discuss her progress with her obesity treatment plan along with follow-up of her obesity related diagnoses. Carla Hanson is on the Category 3 Plan and states she is following her eating plan approximately 98% of the time. Carla Hanson states she is doing cardio and weights for 30-45 minutes 6 times per week.  Today's visit was #: 63 Starting weight: 324 lbs Starting date: 01/03/2021 Today's weight: 290 lbs Today's date: 07/26/2021 Total lbs lost to date: 34 lbs Total lbs lost since last in-office visit: 0  Interim History: Carla Hanson knows that she had more snacks (sweets) over the past few weeks. She snacks more when her grandchildren come over. Her meals are on plan. Her water intake is good. She notes lack of hunger at times. She does exercise videos at home.   She is going to [place] during the month of October.  Subjective:   1. Pre-diabetes Carla Hanson denies polyphagia and actually lacks hunger. She is on Metformin twice daily. She denies loose stools or nausea.  Lab Results  Component Value Date   HGBA1C 6.3 (H) 05/21/2021   Lab Results  Component Value Date   INSULIN 64.3 (H) 05/21/2021   INSULIN 77.3 (H) 01/03/2021    2. Essential hypertension Carla Hanson's hypertension is well controlled on Losartan 50 mg and 12.5mg  HCTZ.   BP Readings from Last 3 Encounters:  07/26/21 133/83  07/10/21 (!) 131/59  06/18/21 128/83     Assessment/Plan:   1. Pre-diabetes We will refill Metformin 500 mg twice daily for 1 month with no refills. - metFORMIN (GLUCOPHAGE) 500 MG tablet; TAKE 1 TABLET(500 MG) BY MOUTH TWICE DAILY WITH A MEAL  Dispense: 180 tablet; Refill: 0  2. Essential hypertension Continue losartan and HCTZ.  3. Obesity with current BMI 51.74 Carla Hanson is currently in the action stage of change. As such, her goal is to continue with weight loss efforts. She has agreed to the Category 3 Plan.   Exercise goals:  As is.  Behavioral  modification strategies: increasing lean protein intake and better snacking choices.  Carla Hanson has agreed to follow-up with our clinic in 6 weeks per patient request.  Objective:   Blood pressure 133/83, pulse 68, temperature 98.1 F (36.7 C), height 5\' 3"  (1.6 m), weight 292 lb (132.5 kg), SpO2 98 %. Body mass index is 51.73 kg/m.  General: Cooperative, alert, well developed, in no acute distress. HEENT: Conjunctivae and lids unremarkable. Cardiovascular: Regular rhythm.  Lungs: Normal work of breathing. Neurologic: No focal deficits.   Lab Results  Component Value Date   CREATININE 0.56 (L) 05/15/2021   BUN 14 05/15/2021   NA 140 05/15/2021   K 4.0 05/15/2021   CL 100 05/15/2021   CO2 22 05/15/2021   Lab Results  Component Value Date   ALT 15 01/03/2021   AST 14 01/03/2021   ALKPHOS 57 01/03/2021   BILITOT 0.3 01/03/2021   Lab Results  Component Value Date   HGBA1C 6.3 (H) 05/21/2021   HGBA1C 6.4 (A) 12/19/2020   HGBA1C 6.1 04/11/2020   HGBA1C 6.0 08/27/2019   HGBA1C 5.6 02/09/2018   Lab Results  Component Value Date   INSULIN 64.3 (H) 05/21/2021   INSULIN 77.3 (H) 01/03/2021   Lab Results  Component Value Date   TSH 1.250 01/03/2021   Lab Results  Component Value Date   CHOL 233 (H) 05/15/2021   HDL 41 05/15/2021   LDLCALC 162 (H) 05/15/2021   TRIG  165 (H) 05/15/2021   CHOLHDL 5.7 (H) 05/15/2021   Lab Results  Component Value Date   VD25OH 42.6 05/15/2021   VD25OH 13.7 (L) 12/19/2020   Lab Results  Component Value Date   WBC 8.8 01/03/2021   HGB 13.2 01/03/2021   HCT 41.4 01/03/2021   MCV 79 01/03/2021   PLT 346 01/03/2021   Lab Results  Component Value Date   IRON 32 12/03/2016   TIBC 363 12/03/2016   FERRITIN 13 (L) 02/09/2018   Attestation Statements:   Reviewed by clinician on day of visit: allergies, medications, problem list, medical history, surgical history, family history, social history, and previous encounter notes.  I, Lizbeth Bark, RMA, am acting as Location manager for [employer], [place].   I have reviewed the above documentation for accuracy and completeness, and I agree with the above. -  Georgianne Fick, FNP

## 2021-07-27 ENCOUNTER — Encounter (INDEPENDENT_AMBULATORY_CARE_PROVIDER_SITE_OTHER): Payer: Self-pay | Admitting: Family Medicine

## 2021-08-23 ENCOUNTER — Other Ambulatory Visit (INDEPENDENT_AMBULATORY_CARE_PROVIDER_SITE_OTHER): Payer: Self-pay | Admitting: Family Medicine

## 2021-08-23 DIAGNOSIS — R7303 Prediabetes: Secondary | ICD-10-CM

## 2021-08-23 NOTE — Telephone Encounter (Signed)
LAST APPOINTMENT DATE: 07/26/21 NEXT APPOINTMENT DATE: 09/19/21   [facility] [place], [place] [address] Phone: [phone] Fax: [phone]  [facility] [place], [address] [place] [address] Phone: [phone] Fax: [place] #[phone] [address], [place] [address] [place] [address] Phone: [phone] Fax: [phone]  Patient is requesting a refill of the following medications: Pending Prescriptions:                       Disp   Refills   metFORMIN (GLUCOPHAGE) 500 MG tablet [Phar*60 tab*        Sig: TAKE 1 TABLET(500 MG) BY MOUTH TWICE DAILY WITH A          MEAL   Date last filled: 07/26/21 Previously prescribed by [facility]  Lab Results      Component                Value               Date                      HGBA1C                   6.3 (H)             05/21/2021                HGBA1C                   6.4 (A)             12/19/2020                HGBA1C                   6.1                 04/11/2020           Lab Results      Component                Value               Date                      LDLCALC                  162 (H)             05/15/2021                CREATININE               0.56 (L)            05/15/2021           Lab Results      Component                Value               Date                      VD25OH  42.6                05/15/2021                VD25OH                   13.7 (L)            12/19/2020            BP Readings from Last 3 Encounters: 07/26/21 : 133/83 07/10/21 : (!) 131/59 06/18/21 : 076/80

## 2021-08-24 ENCOUNTER — Other Ambulatory Visit: Payer: Self-pay | Admitting: Family Medicine

## 2021-09-06 ENCOUNTER — Ambulatory Visit (INDEPENDENT_AMBULATORY_CARE_PROVIDER_SITE_OTHER): Payer: Medicaid Other | Admitting: Family Medicine

## 2021-09-06 DIAGNOSIS — Z03818 Encounter for observation for suspected exposure to other biological agents ruled out: Secondary | ICD-10-CM | POA: Diagnosis not present

## 2021-09-06 DIAGNOSIS — Z20822 Contact with and (suspected) exposure to covid-19: Secondary | ICD-10-CM | POA: Diagnosis not present

## 2021-09-10 ENCOUNTER — Ambulatory Visit (INDEPENDENT_AMBULATORY_CARE_PROVIDER_SITE_OTHER): Payer: Medicaid Other | Admitting: Family Medicine

## 2021-09-13 ENCOUNTER — Encounter (INDEPENDENT_AMBULATORY_CARE_PROVIDER_SITE_OTHER): Payer: Self-pay | Admitting: Family Medicine

## 2021-09-13 NOTE — Telephone Encounter (Signed)
Last OV with [name] Please advise

## 2021-09-13 NOTE — Telephone Encounter (Signed)
Dr. U

## 2021-09-18 DIAGNOSIS — H35373 Puckering of macula, bilateral: Secondary | ICD-10-CM | POA: Diagnosis not present

## 2021-09-18 DIAGNOSIS — H33312 Horseshoe tear of retina without detachment, left eye: Secondary | ICD-10-CM | POA: Diagnosis not present

## 2021-09-18 DIAGNOSIS — H33321 Round hole, right eye: Secondary | ICD-10-CM | POA: Diagnosis not present

## 2021-09-18 DIAGNOSIS — H35413 Lattice degeneration of retina, bilateral: Secondary | ICD-10-CM | POA: Diagnosis not present

## 2021-09-18 DIAGNOSIS — H43813 Vitreous degeneration, bilateral: Secondary | ICD-10-CM | POA: Diagnosis not present

## 2021-09-18 DIAGNOSIS — H35033 Hypertensive retinopathy, bilateral: Secondary | ICD-10-CM | POA: Diagnosis not present

## 2021-09-19 ENCOUNTER — Ambulatory Visit (INDEPENDENT_AMBULATORY_CARE_PROVIDER_SITE_OTHER): Payer: Medicaid Other | Admitting: Family Medicine

## 2021-09-19 ENCOUNTER — Encounter (INDEPENDENT_AMBULATORY_CARE_PROVIDER_SITE_OTHER): Payer: Self-pay | Admitting: Family Medicine

## 2021-09-19 ENCOUNTER — Other Ambulatory Visit: Payer: Self-pay

## 2021-09-19 VITALS — BP 132/77 | HR 76 | Temp 98.2°F | Ht 63.0 in | Wt 290.0 lb

## 2021-09-19 DIAGNOSIS — Z6841 Body Mass Index (BMI) 40.0 and over, adult: Secondary | ICD-10-CM

## 2021-09-19 DIAGNOSIS — E559 Vitamin D deficiency, unspecified: Secondary | ICD-10-CM | POA: Diagnosis not present

## 2021-09-19 DIAGNOSIS — E7849 Other hyperlipidemia: Secondary | ICD-10-CM

## 2021-09-19 MED ORDER — VITAMIN D (ERGOCALCIFEROL) 1.25 MG (50000 UNIT) PO CAPS: 50000.0000 [IU] | ORAL_CAPSULE | ORAL | 0 refills | Status: DC

## 2021-09-19 NOTE — Progress Notes (Signed)
Chief Complaint:   OBESITY Marlia is here to discuss her progress with her obesity treatment plan along with follow-up of her obesity related diagnoses. Nuvia is on the Category 3 Plan and states she is following her eating plan approximately 95% of the time. Estefanie states she is doing cardio and weights for 30-45 minutes 6 times per week.  Today's visit was #: 14 Starting weight: 324 lbs Starting date: 01/03/2021 Today's weight: 290 lbs Today's date: 09/19/2021 Total lbs lost to date: 34 Total lbs lost since last in-office visit: 2  Interim History: Zori continues to do well with weight loss. She has been staying active. She had a wrist/elbow injury and it has limited her exercise.  Subjective:   1. Vitamin D deficiency Sanjna is on Vit D, with no side effects noted. She has no signs of over-replacement.  2. Other hyperlipidemia Chianne started Crestor and she has tolerated it well. She denies myalgias or chest pain. She continues to work on diet and weight loss.  Assessment/Plan:   1. Vitamin D deficiency Low Vitamin D level contributes to fatigue and are associated with obesity, breast, and colon cancer. We will refill prescription Vitamin D for 1 month, and we will recheck labs in 1 month. Greg will follow-up for routine testing of Vitamin D, at least 2-3 times per year to avoid over-replacement.  - Vitamin D, Ergocalciferol, (DRISDOL) 1.25 MG (50000 UNIT) CAPS capsule; Take 1 capsule (50,000 Units total) by mouth every 7 (seven) days.  Dispense: 4 capsule; Refill: 0  2. Other hyperlipidemia Cardiovascular risk and specific lipid/LDL goals reviewed. We discussed several lifestyle modifications today. Caelynn will continue Crestor and diet, and we will recheck labs next month. Orders and follow up as documented in patient record.   3. Obesity BMI today is [place] is currently in the action stage of change. As such, her goal is to continue with weight loss efforts. She  has agreed to the Category 3 Plan.   Exercise goals: As is.  Behavioral modification strategies: holiday eating strategies .  Astella has agreed to follow-up with our clinic in 2 to 3 weeks. She was informed of the importance of frequent follow-up visits to maximize her success with intensive lifestyle modifications for her multiple health conditions.   Objective:   Blood pressure 132/77, pulse 76, temperature 98.2 F (36.8 C), height 5\' 3"  (1.6 m), weight 290 lb (131.5 kg), SpO2 97 %. Body mass index is 51.37 kg/m.  General: Cooperative, alert, well developed, in no acute distress. HEENT: Conjunctivae and lids unremarkable. Cardiovascular: Regular rhythm.  Lungs: Normal work of breathing. Neurologic: No focal deficits.   Lab Results  Component Value Date   CREATININE 0.56 (L) 05/15/2021   BUN 14 05/15/2021   NA 140 05/15/2021   K 4.0 05/15/2021   CL 100 05/15/2021   CO2 22 05/15/2021   Lab Results  Component Value Date   ALT 15 01/03/2021   AST 14 01/03/2021   ALKPHOS 57 01/03/2021   BILITOT 0.3 01/03/2021   Lab Results  Component Value Date   HGBA1C 6.3 (H) 05/21/2021   HGBA1C 6.4 (A) 12/19/2020   HGBA1C 6.1 04/11/2020   HGBA1C 6.0 08/27/2019   HGBA1C 5.6 02/09/2018   Lab Results  Component Value Date   INSULIN 64.3 (H) 05/21/2021   INSULIN 77.3 (H) 01/03/2021   Lab Results  Component Value Date   TSH 1.250 01/03/2021   Lab Results  Component Value Date   CHOL  233 (H) 05/15/2021   HDL 41 05/15/2021   LDLCALC 162 (H) 05/15/2021   TRIG 165 (H) 05/15/2021   CHOLHDL 5.7 (H) 05/15/2021   Lab Results  Component Value Date   VD25OH 42.6 05/15/2021   VD25OH 13.7 (L) 12/19/2020   Lab Results  Component Value Date   WBC 8.8 01/03/2021   HGB 13.2 01/03/2021   HCT 41.4 01/03/2021   MCV 79 01/03/2021   PLT 346 01/03/2021   Lab Results  Component Value Date   IRON 32 12/03/2016   TIBC 363 12/03/2016   FERRITIN 13 (L) 02/09/2018   Attestation  Statements:   Reviewed by clinician on day of visit: allergies, medications, problem list, medical history, surgical history, family history, social history, and previous encounter notes.   I, Trixie Dredge, am acting as transcriptionist for Dennard Nip, MD.  I have reviewed the above documentation for accuracy and completeness, and I agree with the above. -  Dennard Nip, MD

## 2021-09-25 ENCOUNTER — Encounter (INDEPENDENT_AMBULATORY_CARE_PROVIDER_SITE_OTHER): Payer: Self-pay | Admitting: Family Medicine

## 2021-09-30 ENCOUNTER — Other Ambulatory Visit (INDEPENDENT_AMBULATORY_CARE_PROVIDER_SITE_OTHER): Payer: Self-pay | Admitting: Bariatrics

## 2021-09-30 DIAGNOSIS — R7303 Prediabetes: Secondary | ICD-10-CM

## 2021-10-01 ENCOUNTER — Other Ambulatory Visit: Payer: Self-pay

## 2021-10-01 ENCOUNTER — Ambulatory Visit (INDEPENDENT_AMBULATORY_CARE_PROVIDER_SITE_OTHER): Payer: Medicaid Other | Admitting: Family Medicine

## 2021-10-01 ENCOUNTER — Encounter (INDEPENDENT_AMBULATORY_CARE_PROVIDER_SITE_OTHER): Payer: Self-pay | Admitting: Family Medicine

## 2021-10-01 VITALS — BP 133/87 | HR 68 | Temp 98.3°F | Ht 63.0 in | Wt 294.0 lb

## 2021-10-01 DIAGNOSIS — E7849 Other hyperlipidemia: Secondary | ICD-10-CM | POA: Diagnosis not present

## 2021-10-01 DIAGNOSIS — R7303 Prediabetes: Secondary | ICD-10-CM

## 2021-10-01 DIAGNOSIS — E66813 Obesity, class 3: Secondary | ICD-10-CM

## 2021-10-01 DIAGNOSIS — Z6841 Body Mass Index (BMI) 40.0 and over, adult: Secondary | ICD-10-CM

## 2021-10-01 MED ORDER — METFORMIN HCL 500 MG PO TABS: 500.0000 mg | ORAL_TABLET | Freq: Two times a day (BID) | ORAL | 0 refills | Status: DC

## 2021-10-01 NOTE — Progress Notes (Signed)
Chief Complaint:   OBESITY Carla Hanson is here to discuss her progress with her obesity treatment plan along with follow-up of her obesity related diagnoses. Ocie is on the Category 3 Plan and states she is following her eating plan approximately 95-98% of the time. Chequita states she is doing cardio and weights for 30-45 minutes 6 times per week.  Today's visit was #: 15 Starting weight: 324 lbs Starting date: 01/03/2021 Today's weight: 294 lbs Today's date: 10/01/2021 Total lbs lost to date: 30 lbs Total lbs lost since last in-office visit: 0  Interim History: Torin is up 4 lbs today. She is retaining water today. Total  body water was not picked up on bioimpedance today. She notes she had too many desserts over Thanksgiving. She is now back on plan. She is getting in the prescribed protein. Her water intake is adequate.  Subjective:   1. Pre-diabetes Lenell's appetite is well controlled. Her last A1C level was 5.8. She is tolerating Metformin well. Lab Results  Component Value Date   HGBA1C 6.3 (H) 05/21/2021   Lab Results  Component Value Date   INSULIN 64.3 (H) 05/21/2021   INSULIN 77.3 (H) 01/03/2021    2. Other hyperlipidemia Compliant with statin (Crestor 10 mg). Her last LDL was elevated at 162. Her Triglyceride was also elevated at 165. Her HDL was low at 61. She denies chest pain and shortness of breath.  Lab Results  Component Value Date   ALT 15 01/03/2021   AST 14 01/03/2021   ALKPHOS 57 01/03/2021   BILITOT 0.3 01/03/2021   Lab Results  Component Value Date   CHOL 233 (H) 05/15/2021   HDL 41 05/15/2021   LDLCALC 162 (H) 05/15/2021   TRIG 165 (H) 05/15/2021   CHOLHDL 5.7 (H) 05/15/2021    Assessment/Plan:   1. Pre-diabetes We will refill Metformin 500 mg. We will check labs at next office visit.  - metFORMIN (GLUCOPHAGE) 500 MG tablet; Take 1 tablet (500 mg total) by mouth 2 (two) times daily with a meal.  Dispense: 60 tablet; Refill: 0  2.  Other hyperlipidemia . Thurley will continue Crestor. We will check FLP at next office visit.   3. Obesity: Current BMI 52.09 Kimarie is currently in the action stage of change. As such, her goal is to continue with weight loss efforts. She has agreed to the Category 3 Plan.   We discussed different breakfast options.  Exercise goals:  As is.  Behavioral modification strategies: keeping healthy foods in the home.  Sherra has agreed to follow-up with our clinic in 2 weeks.  Objective:   Blood pressure 133/87, pulse 68, temperature 98.3 F (36.8 C), height 5\' 3"  (1.6 m), weight 294 lb (133.4 kg), SpO2 97 %. Body mass index is 52.08 kg/m.  General: Cooperative, alert, well developed, in no acute distress. HEENT: Conjunctivae and lids unremarkable. Cardiovascular: Regular rhythm.  Lungs: Normal work of breathing. Neurologic: No focal deficits.   Lab Results  Component Value Date   CREATININE 0.56 (L) 05/15/2021   BUN 14 05/15/2021   NA 140 05/15/2021   K 4.0 05/15/2021   CL 100 05/15/2021   CO2 22 05/15/2021   Lab Results  Component Value Date   ALT 15 01/03/2021   AST 14 01/03/2021   ALKPHOS 57 01/03/2021   BILITOT 0.3 01/03/2021   Lab Results  Component Value Date   HGBA1C 6.3 (H) 05/21/2021   HGBA1C 6.4 (A) 12/19/2020   HGBA1C 6.1 04/11/2020  HGBA1C 6.0 08/27/2019   HGBA1C 5.6 02/09/2018   Lab Results  Component Value Date   INSULIN 64.3 (H) 05/21/2021   INSULIN 77.3 (H) 01/03/2021   Lab Results  Component Value Date   TSH 1.250 01/03/2021   Lab Results  Component Value Date   CHOL 233 (H) 05/15/2021   HDL 41 05/15/2021   LDLCALC 162 (H) 05/15/2021   TRIG 165 (H) 05/15/2021   CHOLHDL 5.7 (H) 05/15/2021   Lab Results  Component Value Date   VD25OH 42.6 05/15/2021   VD25OH 13.7 (L) 12/19/2020   Lab Results  Component Value Date   WBC 8.8 01/03/2021   HGB 13.2 01/03/2021   HCT 41.4 01/03/2021   MCV 79 01/03/2021   PLT 346 01/03/2021   Lab  Results  Component Value Date   IRON 32 12/03/2016   TIBC 363 12/03/2016   FERRITIN 13 (L) 02/09/2018   Attestation Statements:   Reviewed by clinician on day of visit: allergies, medications, problem list, medical history, surgical history, family history, social history, and previous encounter notes.  I, Lizbeth Bark, RMA, am acting as Location manager for [employer], [place].   I have reviewed the above documentation for accuracy and completeness, and I agree with the above. -  Georgianne Fick, FNP

## 2021-10-02 ENCOUNTER — Encounter: Payer: Self-pay | Admitting: Family Medicine

## 2021-10-02 ENCOUNTER — Other Ambulatory Visit: Payer: Self-pay

## 2021-10-02 ENCOUNTER — Ambulatory Visit (INDEPENDENT_AMBULATORY_CARE_PROVIDER_SITE_OTHER): Payer: Medicaid Other | Admitting: Family Medicine

## 2021-10-02 VITALS — BP 143/57 | HR 64 | Ht 63.0 in | Wt 299.4 lb

## 2021-10-02 DIAGNOSIS — R002 Palpitations: Secondary | ICD-10-CM | POA: Diagnosis not present

## 2021-10-02 DIAGNOSIS — F4321 Adjustment disorder with depressed mood: Secondary | ICD-10-CM | POA: Diagnosis not present

## 2021-10-02 NOTE — Assessment & Plan Note (Signed)
She is currently asymptomatic. Likely related to coffee and albuterol use. She is now currently off caffeinated coffee. I encourage use of albuterol only when needed. Holter monitor done in 2021 was reviewed and discussed with her during this visit - no red flags. EKG offered today but she prefers to hold off for now since she feels better. Recent TSH was normal. I offered to check her Bmet and magnesium level. However, she prefers to get lab down at her next visit with weight management clinic. ED precaution discussed. Consider cardiology referral in the future if symptoms persist despite being off coffee and albuterol. She agreed with the plan.

## 2021-10-02 NOTE — Progress Notes (Signed)
    SUBJECTIVE:   CHIEF COMPLAINT / HPI:   Palpitation:  C/O heart flutter, which she experienced for one week. Her last episode was 4-5 days ago. She denies chest pain, no SOB, and no fever. Her heart flutters mainly in the morning after breakfast and will occur intermittently during the day. She drinks 2 cups of caffeinated coffee every morning but stopped about 3-4 days ago and has noticed an improvement in her symptoms since then. She also uses Albuterol daily at night and in the morning to prevent allergy symptoms.   Grief: Her niece recently passed away and she became teary when she saw the [place] who looks so much like her niece. She otherwise endorses feeling well. No other mental health concerns.  HM:She is due for flu shot and pneumonia shot.  PERTINENT  PMH / PSH: PMX reviewed.  OBJECTIVE:   BP (!) 143/57   Pulse 64   Ht 5\' 3"  (1.6 m)   Wt 299 lb 6.4 oz (135.8 kg)   LMP  (LMP Unknown)   SpO2 100%   BMI 53.04 kg/m   Physical Exam Vitals and nursing note reviewed.  Cardiovascular:     Rate and Rhythm: Normal rate and regular rhythm.     Heart sounds: Normal heart sounds. No murmur heard.   No gallop.  Pulmonary:     Effort: Pulmonary effort is normal. No respiratory distress.     Breath sounds: Normal breath sounds. No wheezing.  Abdominal:     General: Bowel sounds are normal.  Musculoskeletal:     Right lower leg: No edema.     Left lower leg: No edema.   [place] Office Visit from 10/02/2021 in [place]  PHQ-9 Total Score 0        ASSESSMENT/PLAN:   Palpitation She is currently asymptomatic. Likely related to coffee and albuterol use. She is now currently off caffeinated coffee. I encourage use of albuterol only when needed. Holter monitor done in 2021 was reviewed and discussed with her during this visit - no red flags. EKG offered today but she prefers to hold off for now since she feels better. Recent TSH was  normal. I offered to check her Bmet and magnesium level. However, she prefers to get lab down at her next visit with weight management clinic. ED precaution discussed. Consider cardiology referral in the future if symptoms persist despite being off coffee and albuterol. She agreed with the plan.    Grief: No depression. Seems to be doing well. Grief counselor referral offered. Monitor for now and she will reach out as needed.  Flu and PCV20 offered, she declined for now. She will contact our office soon regarding vaccination.  Andrena Mews, MD [place]

## 2021-10-02 NOTE — Patient Instructions (Signed)
Pneumococcal Conjugate Vaccine: What You Need to Know 1. Why get vaccinated? Pneumococcal conjugate vaccine can prevent pneumococcal disease. Pneumococcal disease refers to any illness caused by pneumococcal bacteria. These bacteria can cause many types of illnesses, including pneumonia, which is an infection of the lungs. Pneumococcal bacteria are one of the most common causes of pneumonia. Besides pneumonia, pneumococcal bacteria can also cause: Ear infections Sinus infections Meningitis (infection of the tissue covering the brain and spinal cord) Bacteremia (infection of the blood) Anyone can get pneumococcal disease, but children under 55 years old, people with certain medical conditions or other risk factors, and adults 40 years or older are at the highest risk. Most pneumococcal infections are mild. However, some can result in long-term problems, such as brain damage or hearing loss. Meningitis, bacteremia, and pneumonia caused by pneumococcal disease can be fatal. 2. Pneumococcal conjugate vaccine Pneumococcal conjugate vaccine helps protect against bacteria that cause pneumococcal disease. There are three pneumococcal conjugate vaccines (PCV13, PCV15, and PCV20). The different vaccines are recommended for different people based on their age and medical status. PCV13 Infants and young children usually need 4 doses of PCV13, at ages 5, 110, 48, and 12-15 months. Older children (through age 19 months) may be vaccinated with PCV13 if they did not receive the recommended doses. Children and adolescents 74-40 years of age with certain medical conditions should receive a single dose of PCV13 if they did not already receive PCV13. PCV15 or PCV20 Adults 42 through 55 years old with certain medical conditions or other risk factors who have not already received a pneumococcal conjugate vaccine should receive either: a single dose of PCV15 followed by a dose of pneumococcal polysaccharide vaccine (PPSV23),  or a single dose of PCV20. Adults 36 years or older who have not already received a pneumococcal conjugate vaccine should receive either: a single dose of PCV15 followed by a dose of PPSV23, or a single dose of PCV20. Your health care provider can give you more information. 3. Talk with your health care provider Tell your vaccination provider if the person getting the vaccine: Has had an allergic reaction after a previous dose of any type of pneumococcal conjugate vaccine (PCV13, PCV15, PCV20, or an earlier pneumococcal conjugate vaccine known as PCV7), or to any vaccine containing diphtheria toxoid (for example, DTaP), or has any severe, life-threatening allergies In some cases, your health care provider may decide to postpone pneumococcal conjugate vaccination until a future visit. People with minor illnesses, such as a cold, may be vaccinated. People who are moderately or severely ill should usually wait until they recover. Your health care provider can give you more information. 4. Risks of a vaccine reaction Redness, swelling, pain, or tenderness where the shot is given, and fever, loss of appetite, fussiness (irritability), feeling tired, headache, muscle aches, joint pain, and chills can happen after pneumococcal conjugate vaccination. Young children may be at increased risk for seizures caused by fever after PCV13 if it is administered at the same time as inactivated influenza vaccine. Ask your health care provider for more information. People sometimes faint after medical procedures, including vaccination. Tell your provider if you feel dizzy or have vision changes or ringing in the ears. As with any medicine, there is a very remote chance of a vaccine causing a severe allergic reaction, other serious injury, or death. 5. What if there is a serious problem? An allergic reaction could occur after the vaccinated person leaves the clinic. If you see signs of a severe allergic  reaction (hives,  swelling of the face and throat, difficulty breathing, a fast heartbeat, dizziness, or weakness), call 9-1-1 and get the person to the nearest hospital. For other signs that concern you, call your health care provider. Adverse reactions should be reported to the Vaccine Adverse Event Reporting System (VAERS). Your health care provider will usually file this report, or you can do it yourself. Visit the VAERS website at www.vaers.SamedayNews.es or call 204-754-0082. VAERS is only for reporting reactions, and VAERS staff members do not give medical advice. 6. The National Vaccine Injury Compensation Program The [employer] Vaccine Injury Compensation Program (VICP) is a federal program that was created to compensate people who may have been injured by certain vaccines. Claims regarding alleged injury or death due to vaccination have a time limit for filing, which may be as short as two years. Visit the VICP website at GoldCloset.com.ee or call 334-397-2905 to learn about the program and about filing a claim. 7. How can I learn more? Ask your health care provider. Call your local or state health department. Visit the website of the Food and Drug Administration (FDA) for vaccine package inserts and additional information at TraderRating.uy. Contact the Centers for Disease Control and Prevention (CDC): Call 5613925091 (1-800-CDC-INFO) or Visit CDC's website at http://hunter.com/. Vaccine Information Statement (Interim) Pneumococcal Conjugate Vaccine (12/08/2020) This information is not intended to replace advice given to you by your health care provider. Make sure you discuss any questions you have with your health care provider. Document Revised: 07/06/2021 Document Reviewed: 12/22/2020 Elsevier Patient Education  2022 [employer].

## 2021-10-09 DIAGNOSIS — H33323 Round hole, bilateral: Secondary | ICD-10-CM | POA: Diagnosis not present

## 2021-10-09 DIAGNOSIS — H3589 Other specified retinal disorders: Secondary | ICD-10-CM | POA: Diagnosis not present

## 2021-10-09 DIAGNOSIS — H31092 Other chorioretinal scars, left eye: Secondary | ICD-10-CM | POA: Diagnosis not present

## 2021-10-09 DIAGNOSIS — H35413 Lattice degeneration of retina, bilateral: Secondary | ICD-10-CM | POA: Diagnosis not present

## 2021-10-17 ENCOUNTER — Encounter (INDEPENDENT_AMBULATORY_CARE_PROVIDER_SITE_OTHER): Payer: Self-pay | Admitting: Family Medicine

## 2021-10-17 ENCOUNTER — Telehealth (INDEPENDENT_AMBULATORY_CARE_PROVIDER_SITE_OTHER): Payer: Medicaid Other | Admitting: Family Medicine

## 2021-10-17 VITALS — BP 128/76 | Ht 63.0 in | Wt 293.0 lb

## 2021-10-17 DIAGNOSIS — J069 Acute upper respiratory infection, unspecified: Secondary | ICD-10-CM

## 2021-10-17 DIAGNOSIS — E785 Hyperlipidemia, unspecified: Secondary | ICD-10-CM

## 2021-10-17 DIAGNOSIS — Z6841 Body Mass Index (BMI) 40.0 and over, adult: Secondary | ICD-10-CM

## 2021-10-17 DIAGNOSIS — R7303 Prediabetes: Secondary | ICD-10-CM

## 2021-10-17 MED ORDER — METFORMIN HCL 500 MG PO TABS: 500.0000 mg | ORAL_TABLET | Freq: Two times a day (BID) | ORAL | 0 refills | Status: DC

## 2021-10-17 MED ORDER — ROSUVASTATIN CALCIUM 10 MG PO TABS: 10.0000 mg | ORAL_TABLET | Freq: Every day | ORAL | 0 refills | Status: DC

## 2021-10-17 MED ORDER — ROSUVASTATIN CALCIUM 10 MG PO TABS: 10.0000 mg | ORAL_TABLET | Freq: Every day | ORAL | 3 refills | Status: DC

## 2021-10-17 NOTE — Progress Notes (Signed)
TeleHealth Visit:  Due to the COVID-19 pandemic, this visit was completed with telemedicine (audio/video) technology to reduce patient and provider exposure as well as to preserve personal protective equipment.   Carla Hanson has verbally consented to this TeleHealth visit. The patient is located at home, the provider is located at the [facility] office. The participants in this visit include the listed provider and patient. The visit was conducted today via MyChart video.   Chief Complaint: OBESITY Carla Hanson is here to discuss her progress with her obesity treatment plan along with follow-up of her obesity related diagnoses. Carla Hanson is on the Category 3 Plan and states she is following her eating plan approximately 95% of the time. Carla Hanson states she is doing cardio, yoga, and weights 6 times per week.   Today's visit was #: 16 Starting weight: 324 lbs Starting date: 01/03/2021  Interim History: Carla Hanson has been sick for a few days and she hasn't been able to follow her plan as closely. She has tried to hydrate and have more protein than she normally would while sick.  Subjective:   1. Pre-diabetes Carla Hanson is working on diet and she has had some increased simple carbohydrates while sick, but she is still being mindful.  2. Hyperlipidemia, unspecified hyperlipidemia type Carla Hanson is working on decreasing cholesterol in her diet. She is stable on Crestor, with no chest pain or myalgias noted.  3. Upper respiratory tract infection, unspecified type Carla Hanson has had congestion, pharyngitis, and cough for 4 days. She has had positive sick contacts. Her symptoms have improved with Mucinex. She denies shortness of breath. She has not tested for COVID or the Flu.  Assessment/Plan:   1. Pre-diabetes We will refill metformin for 1 month. Carla Hanson will continue to work on weight loss, exercise, and decreasing simple carbohydrates to help decrease the risk of diabetes.   - metFORMIN  (GLUCOPHAGE) 500 MG tablet; Take 1 tablet (500 mg total) by mouth 2 (two) times daily with a meal.  Dispense: 60 tablet; Refill: 0  2. Hyperlipidemia, unspecified hyperlipidemia type We will refill Crestor for 1 month. Cardiovascular risk and specific lipid/LDL goals reviewed.  We discussed several lifestyle modifications today and Carla Hanson will continue to work on diet, exercise and weight loss efforts. Orders and follow up as documented in patient record.   - rosuvastatin (CRESTOR) 10 MG tablet; Take 1 tablet (10 mg total) by mouth daily.  Dispense: 30 tablet; Refill: 0  3. Upper respiratory tract infection, unspecified type Carla Hanson is to increase her water intake and OTC Mucinex is ok. She is to contact her primary care physician if her symptoms worsen. She is to hold off on exercise until she feels better.  4. Obesity: Current BMI 52.09 Carla Hanson is currently in the action stage of change. As such, her goal is to continue with weight loss efforts. She has agreed to the Category 3 Plan.   Exercise goals: As is. Resume once she is feeling better.  Behavioral modification strategies: increasing lean protein intake and increasing water intake.  Carla Hanson has agreed to follow-up with our clinic in 3 to 4 weeks with [facility], FNP-C. She was informed of the importance of frequent follow-up visits to maximize her success with intensive lifestyle modifications for her multiple health conditions.  Objective:   VITALS: Per patient if applicable, see vitals. GENERAL: Alert and in no acute distress. CARDIOPULMONARY: No increased WOB. Speaking in clear sentences.  PSYCH: Pleasant and cooperative. Speech normal rate and rhythm. Affect is  appropriate. Insight and judgement are appropriate. Attention is focused, linear, and appropriate.  NEURO: Oriented as arrived to appointment on time with no prompting.   Lab Results  Component Value Date   CREATININE 0.56 (L) 05/15/2021   BUN 14 05/15/2021   NA 140  05/15/2021   K 4.0 05/15/2021   CL 100 05/15/2021   CO2 22 05/15/2021   Lab Results  Component Value Date   ALT 15 01/03/2021   AST 14 01/03/2021   ALKPHOS 57 01/03/2021   BILITOT 0.3 01/03/2021   Lab Results  Component Value Date   HGBA1C 6.3 (H) 05/21/2021   HGBA1C 6.4 (A) 12/19/2020   HGBA1C 6.1 04/11/2020   HGBA1C 6.0 08/27/2019   HGBA1C 5.6 02/09/2018   Lab Results  Component Value Date   INSULIN 64.3 (H) 05/21/2021   INSULIN 77.3 (H) 01/03/2021   Lab Results  Component Value Date   TSH 1.250 01/03/2021   Lab Results  Component Value Date   CHOL 233 (H) 05/15/2021   HDL 41 05/15/2021   LDLCALC 162 (H) 05/15/2021   TRIG 165 (H) 05/15/2021   CHOLHDL 5.7 (H) 05/15/2021   Lab Results  Component Value Date   VD25OH 42.6 05/15/2021   VD25OH 13.7 (L) 12/19/2020   Lab Results  Component Value Date   WBC 8.8 01/03/2021   HGB 13.2 01/03/2021   HCT 41.4 01/03/2021   MCV 79 01/03/2021   PLT 346 01/03/2021   Lab Results  Component Value Date   IRON 32 12/03/2016   TIBC 363 12/03/2016   FERRITIN 13 (L) 02/09/2018    Attestation Statements:   Reviewed by clinician on day of visit: allergies, medications, problem list, medical history, surgical history, family history, social history, and previous encounter notes.   I, Trixie Dredge, am acting as transcriptionist for Dennard Nip, MD.  I have reviewed the above documentation for accuracy and completeness, and I agree with the above. - Dennard Nip, MD

## 2021-10-18 ENCOUNTER — Other Ambulatory Visit (INDEPENDENT_AMBULATORY_CARE_PROVIDER_SITE_OTHER): Payer: Self-pay | Admitting: Family Medicine

## 2021-10-18 DIAGNOSIS — E785 Hyperlipidemia, unspecified: Secondary | ICD-10-CM

## 2021-10-22 DIAGNOSIS — H31092 Other chorioretinal scars, left eye: Secondary | ICD-10-CM | POA: Diagnosis not present

## 2021-10-22 DIAGNOSIS — H3589 Other specified retinal disorders: Secondary | ICD-10-CM | POA: Diagnosis not present

## 2021-10-22 DIAGNOSIS — H35413 Lattice degeneration of retina, bilateral: Secondary | ICD-10-CM | POA: Diagnosis not present

## 2021-10-22 DIAGNOSIS — H33323 Round hole, bilateral: Secondary | ICD-10-CM | POA: Diagnosis not present

## 2021-10-24 DIAGNOSIS — H5213 Myopia, bilateral: Secondary | ICD-10-CM | POA: Diagnosis not present

## 2021-11-07 ENCOUNTER — Other Ambulatory Visit: Payer: Self-pay

## 2021-11-07 ENCOUNTER — Encounter (INDEPENDENT_AMBULATORY_CARE_PROVIDER_SITE_OTHER): Payer: Self-pay | Admitting: Family Medicine

## 2021-11-07 ENCOUNTER — Ambulatory Visit (INDEPENDENT_AMBULATORY_CARE_PROVIDER_SITE_OTHER): Payer: Medicaid Other | Admitting: Family Medicine

## 2021-11-07 VITALS — BP 145/75 | HR 105 | Temp 98.5°F | Ht 63.0 in | Wt 298.0 lb

## 2021-11-07 DIAGNOSIS — R609 Edema, unspecified: Secondary | ICD-10-CM | POA: Diagnosis not present

## 2021-11-07 DIAGNOSIS — Z6841 Body Mass Index (BMI) 40.0 and over, adult: Secondary | ICD-10-CM

## 2021-11-07 DIAGNOSIS — R7303 Prediabetes: Secondary | ICD-10-CM | POA: Diagnosis not present

## 2021-11-07 DIAGNOSIS — R600 Localized edema: Secondary | ICD-10-CM

## 2021-11-07 MED ORDER — HYDROCHLOROTHIAZIDE 25 MG PO TABS: 25.0000 mg | ORAL_TABLET | Freq: Every day | ORAL | 0 refills | Status: DC

## 2021-11-07 NOTE — Progress Notes (Signed)
Chief Complaint:   OBESITY Carla Hanson is here to discuss her progress with her obesity treatment plan along with follow-up of her obesity related diagnoses. Carla Hanson is on the Category 3 Plan and states she is following her eating plan approximately 90% of the time. Carla Hanson states she is doing cardio, weights and yoga for 30-45 minutes 5 times per week.  Today's visit was #: 53 Starting weight: 324 lbs Starting date: 01/03/2021 Today's weight: 298 lbs Today's date: 11/07/2021 Total lbs lost to date: 26 lbs Total lbs lost since last in-office visit: 0  Interim History: She is up 4 lbs today. Carla Hanson feels she is retaining some fluid.  She does not always get in the prescribed protein.  Her hunger is well controlled.  She does a great job with exercise.   Subjective:   1. Peripheral edema Carla Hanson notes increased swelling in feet and ankles recently, especially when she eats salty foods. She denies chest pain, shortness of breath and orthopnea. The swelling subsides over night.   2. Pre-diabetes Carla Hanson is on Metformin 500 mg twice daily. She is tolerating this fairly well. Reports some flatulence. Her last A1C was 6.3. Denies polyphagia.  Lab Results  Component Value Date   HGBA1C 6.3 (H) 05/21/2021   Lab Results  Component Value Date   INSULIN 64.3 (H) 05/21/2021   INSULIN 77.3 (H) 01/03/2021    Assessment/Plan:   1. Peripheral edema Carla Hanson will try to keep salt < than 2000 mg/per day. We went over how to read a food label. We will increase dose HCTZ to 25 mg. We will refill HCTZ 25 mg every day.   - hydrochlorothiazide (HYDRODIURIL) 25 MG tablet; Take 1 tablet (25 mg total) by mouth daily.  Dispense: 90 tablet; Refill: 0  2. Pre-diabetes Carla Hanson will continue Metformin 500 mg twice daily.   3. Obesity: Current BMI 52.8 Carla Hanson is currently in the action stage of change. As such, her goal is to continue with weight loss efforts. She has agreed to the Category 3 Plan and keeping  a food journal and adhering to recommended goals of 250-350 calories and 20 grams of protein at breakfast.  Exercise goals:  As is.  Behavioral modification strategies: increasing lean protein intake and meal planning and cooking strategies.  Carla Hanson has agreed to follow-up with our clinic in 2-3 weeks (fasting).  Objective:   Blood pressure (!) 145/75, pulse (!) 105, temperature 98.5 F (36.9 C), height 5\' 3"  (1.6 m), weight 298 lb (135.2 kg), SpO2 96 %. Body mass index is 52.79 kg/m.  General: Cooperative, alert, well developed, in no acute distress. HEENT: Conjunctivae and lids unremarkable. Cardiovascular: Regular rhythm.  Lungs: Normal work of breathing. Neurologic: No focal deficits.   Lab Results  Component Value Date   CREATININE 0.56 (L) 05/15/2021   BUN 14 05/15/2021   NA 140 05/15/2021   K 4.0 05/15/2021   CL 100 05/15/2021   CO2 22 05/15/2021   Lab Results  Component Value Date   ALT 15 01/03/2021   AST 14 01/03/2021   ALKPHOS 57 01/03/2021   BILITOT 0.3 01/03/2021   Lab Results  Component Value Date   HGBA1C 6.3 (H) 05/21/2021   HGBA1C 6.4 (A) 12/19/2020   HGBA1C 6.1 04/11/2020   HGBA1C 6.0 08/27/2019   HGBA1C 5.6 02/09/2018   Lab Results  Component Value Date   INSULIN 64.3 (H) 05/21/2021   INSULIN 77.3 (H) 01/03/2021   Lab Results  Component Value Date  TSH 1.250 01/03/2021   Lab Results  Component Value Date   CHOL 233 (H) 05/15/2021   HDL 41 05/15/2021   LDLCALC 162 (H) 05/15/2021   TRIG 165 (H) 05/15/2021   CHOLHDL 5.7 (H) 05/15/2021   Lab Results  Component Value Date   VD25OH 42.6 05/15/2021   VD25OH 13.7 (L) 12/19/2020   Lab Results  Component Value Date   WBC 8.8 01/03/2021   HGB 13.2 01/03/2021   HCT 41.4 01/03/2021   MCV 79 01/03/2021   PLT 346 01/03/2021   Lab Results  Component Value Date   IRON 32 12/03/2016   TIBC 363 12/03/2016   FERRITIN 13 (L) 02/09/2018   Attestation Statements:   Reviewed by clinician  on day of visit: allergies, medications, problem list, medical history, surgical history, family history, social history, and previous encounter notes.  I, Lizbeth Bark, RMA, am acting as Location manager for [employer], [place].  I have reviewed the above documentation for accuracy and completeness, and I agree with the above. -  Georgianne Fick, FNP

## 2021-11-08 ENCOUNTER — Other Ambulatory Visit (INDEPENDENT_AMBULATORY_CARE_PROVIDER_SITE_OTHER): Payer: Self-pay | Admitting: Family Medicine

## 2021-11-08 DIAGNOSIS — R609 Edema, unspecified: Secondary | ICD-10-CM

## 2021-11-08 NOTE — Telephone Encounter (Signed)
Pt last seen by [name] Whitmire, FNP.  

## 2021-11-13 ENCOUNTER — Encounter (INDEPENDENT_AMBULATORY_CARE_PROVIDER_SITE_OTHER): Payer: Self-pay | Admitting: Family Medicine

## 2021-11-19 ENCOUNTER — Encounter (INDEPENDENT_AMBULATORY_CARE_PROVIDER_SITE_OTHER): Payer: Self-pay

## 2021-11-20 ENCOUNTER — Other Ambulatory Visit: Payer: Self-pay

## 2021-11-20 ENCOUNTER — Encounter: Payer: Self-pay | Admitting: Family Medicine

## 2021-11-20 ENCOUNTER — Encounter (INDEPENDENT_AMBULATORY_CARE_PROVIDER_SITE_OTHER): Payer: Self-pay | Admitting: Family Medicine

## 2021-11-20 ENCOUNTER — Ambulatory Visit (INDEPENDENT_AMBULATORY_CARE_PROVIDER_SITE_OTHER): Payer: Medicaid Other | Admitting: Family Medicine

## 2021-11-20 VITALS — BP 114/78 | HR 74 | Temp 98.5°F | Ht 63.0 in | Wt 294.0 lb

## 2021-11-20 DIAGNOSIS — R609 Edema, unspecified: Secondary | ICD-10-CM

## 2021-11-20 DIAGNOSIS — R7303 Prediabetes: Secondary | ICD-10-CM | POA: Diagnosis not present

## 2021-11-20 DIAGNOSIS — Z6841 Body Mass Index (BMI) 40.0 and over, adult: Secondary | ICD-10-CM | POA: Diagnosis not present

## 2021-11-20 DIAGNOSIS — E7849 Other hyperlipidemia: Secondary | ICD-10-CM | POA: Diagnosis not present

## 2021-11-20 DIAGNOSIS — R002 Palpitations: Secondary | ICD-10-CM

## 2021-11-20 DIAGNOSIS — R600 Localized edema: Secondary | ICD-10-CM

## 2021-11-20 DIAGNOSIS — E559 Vitamin D deficiency, unspecified: Secondary | ICD-10-CM | POA: Diagnosis not present

## 2021-11-20 NOTE — Progress Notes (Signed)
Chief Complaint:   OBESITY Carla Hanson is here to discuss her progress with her obesity treatment plan along with follow-up of her obesity related diagnoses. Carla Hanson is on the Category 3 Plan and keeping a food journal and adhering to recommended goals of 250-350 calories and 20 grams of protein with breakfast and states she is following her eating plan approximately 90% of the time. Carla Hanson states she is doing cardio and weights for 30-45 minutes 5-6 times per week.  Today's visit was #: 18 Starting weight: 324 lbs Starting date: 01/03/2021 Today's weight: 294 lbs Today's date: 11/20/2021 Total lbs lost to date: 30 lbs Total lbs lost since last in-office visit: 4 lbs  Interim History: Carla Hanson is doing quite well on plan and is down 4 lbs today. She notes polyphagia at night. She is only eating 5 ounces of meat at dinner. She is consistent with exercise.  Subjective:   1. Peripheral edema Carla Hanson's peripheral edema is much improved with HCTZ prescribed at last office visit.  2. Pre-diabetes Carla Hanson is on Metformin 500 mg 2 times a day.  Lab Results  Component Value Date   HGBA1C 6.3 (H) 05/21/2021   Lab Results  Component Value Date   INSULIN 64.3 (H) 05/21/2021   INSULIN 77.3 (H) 01/03/2021    3. Palpitations Carla Hanson notes occasional palpitations. She has had palpitations for 1-2 months. Her primary care physician wanted her to have magnesium checked.  She denies chest pains.   4. Other hyperlipidemia Not at goal. Carla Hanson's last LDL was elevated at 162. Her Triglycerides was elevated at 165. Her HDL was low at 41. She is currently on Crestor 10 mg.  Lab Results  Component Value Date   CHOL 233 (H) 05/15/2021   HDL 41 05/15/2021   LDLCALC 162 (H) 05/15/2021   TRIG 165 (H) 05/15/2021   CHOLHDL 5.7 (H) 05/15/2021   Lab Results  Component Value Date   ALT 15 01/03/2021   AST 14 01/03/2021   ALKPHOS 57 01/03/2021   BILITOT 0.3 01/03/2021   The 10-year ASCVD risk score  (Arnett DK, et al., 2019) is: 11.7%   Values used to calculate the score:     Age: 56 years     Sex: Female     Is Non-Hispanic African American: Yes     Diabetic: Yes     Tobacco smoker: No     Systolic Blood Pressure: 595 mmHg     Is BP treated: Yes     HDL Cholesterol: 41 mg/dL     Total Cholesterol: 233 mg/dL  5. Vitamin D deficiency Carla Hanson's Vitamin D is low at 42.6. She is currently on OTC Vitamin D. She is unsure of dose. She says the prescription Vitamin D contains pork.   Lab Results  Component Value Date   VD25OH 42.6 05/15/2021   VD25OH 13.7 (L) 12/19/2020    Assessment/Plan:   1. Peripheral edema Carla Hanson will continue HCTZ.  2. Pre-diabetes We will check labs. Carla Hanson will continue Metformin and she will continue to work on weight loss, exercise, and decreasing simple carbohydrates to help decrease the risk of diabetes.   - Comprehensive metabolic panel - Hemoglobin A1c - Insulin, random  3. Palpitations We will check labs today. Carla Hanson will see primary care physician for continued palpations.   - Magnesium  4. Other hyperlipidemia Carla Hanson will continue Crestor. We will check FLP today.  - Lipid Panel With LDL/HDL Ratio  5. Vitamin D deficiency Carla Hanson agrees to continue to  take OTC Vitamin D and she will follow-up for routine testing of Vitamin D, at least 2-3 times per year to avoid over-replacement.We will check Vitamin D level today.   - VITAMIN D 25 Hydroxy (Vit-D Deficiency, Fractures)  6. Obesity: Current BMI 52.09 Carla Hanson is currently in the action stage of change. As such, her goal is to continue with weight loss efforts. She has agreed to the Category 3 Plan and keeping a food journal and adhering to recommended goals of 250-350 calories and 20 grams of protein at breakfast.   Carla Hanson was provided Handouts: Protein exchanges.  Exercise goals:  As is.  Behavioral modification strategies: increasing lean protein intake and better snacking  choices.  Carla Hanson has agreed to follow-up with our clinic in 3 weeks.  Objective:   Blood pressure 114/78, pulse 74, temperature 98.5 F (36.9 C), height 5\' 3"  (1.6 m), weight 294 lb (133.4 kg), SpO2 98 %. Body mass index is 52.08 kg/m.  General: Cooperative, alert, well developed, in no acute distress. HEENT: Conjunctivae and lids unremarkable. Cardiovascular: Regular rhythm.  Lungs: Normal work of breathing. Neurologic: No focal deficits.   Lab Results  Component Value Date   CREATININE 0.56 (L) 05/15/2021   BUN 14 05/15/2021   NA 140 05/15/2021   K 4.0 05/15/2021   CL 100 05/15/2021   CO2 22 05/15/2021   Lab Results  Component Value Date   ALT 15 01/03/2021   AST 14 01/03/2021   ALKPHOS 57 01/03/2021   BILITOT 0.3 01/03/2021   Lab Results  Component Value Date   HGBA1C 6.3 (H) 05/21/2021   HGBA1C 6.4 (A) 12/19/2020   HGBA1C 6.1 04/11/2020   HGBA1C 6.0 08/27/2019   HGBA1C 5.6 02/09/2018   Lab Results  Component Value Date   INSULIN 64.3 (H) 05/21/2021   INSULIN 77.3 (H) 01/03/2021   Lab Results  Component Value Date   TSH 1.250 01/03/2021   Lab Results  Component Value Date   CHOL 233 (H) 05/15/2021   HDL 41 05/15/2021   LDLCALC 162 (H) 05/15/2021   TRIG 165 (H) 05/15/2021   CHOLHDL 5.7 (H) 05/15/2021   Lab Results  Component Value Date   VD25OH 42.6 05/15/2021   VD25OH 13.7 (L) 12/19/2020   Lab Results  Component Value Date   WBC 8.8 01/03/2021   HGB 13.2 01/03/2021   HCT 41.4 01/03/2021   MCV 79 01/03/2021   PLT 346 01/03/2021   Lab Results  Component Value Date   IRON 32 12/03/2016   TIBC 363 12/03/2016   FERRITIN 13 (L) 02/09/2018   Attestation Statements:   Reviewed by clinician on day of visit: allergies, medications, problem list, medical history, surgical history, family history, social history, and previous encounter notes.  I, Lizbeth Bark, RMA, am acting as Location manager for [employer], [place].  I have reviewed the above  documentation for accuracy and completeness, and I agree with the above. -  Georgianne Fick, FNP

## 2021-11-21 ENCOUNTER — Encounter (INDEPENDENT_AMBULATORY_CARE_PROVIDER_SITE_OTHER): Payer: Self-pay | Admitting: Family Medicine

## 2021-11-21 ENCOUNTER — Telehealth (INDEPENDENT_AMBULATORY_CARE_PROVIDER_SITE_OTHER): Payer: Self-pay | Admitting: Family Medicine

## 2021-11-21 LAB — COMPREHENSIVE METABOLIC PANEL
ALT: 12 IU/L (ref 0–32)
AST: 11 IU/L (ref 0–40)
Albumin/Globulin Ratio: 1.2 (ref 1.2–2.2)
Albumin: 4.1 g/dL (ref 3.8–4.9)
Alkaline Phosphatase: 58 IU/L (ref 44–121)
BUN/Creatinine Ratio: 24 — ABNORMAL HIGH (ref 9–23)
BUN: 16 mg/dL (ref 6–24)
Bilirubin Total: 0.3 mg/dL (ref 0.0–1.2)
CO2: 26 mmol/L (ref 20–29)
Calcium: 10 mg/dL (ref 8.7–10.2)
Chloride: 101 mmol/L (ref 96–106)
Creatinine, Ser: 0.66 mg/dL (ref 0.57–1.00)
Globulin, Total: 3.3 g/dL (ref 1.5–4.5)
Glucose: 96 mg/dL (ref 70–99)
Potassium: 4 mmol/L (ref 3.5–5.2)
Sodium: 142 mmol/L (ref 134–144)
Total Protein: 7.4 g/dL (ref 6.0–8.5)
eGFR: 104 mL/min/{1.73_m2} (ref >59)

## 2021-11-21 LAB — HEMOGLOBIN A1C
Est. average glucose Bld gHb Est-mCnc: 131 mg/dL
Hgb A1c MFr Bld: 6.2 % — ABNORMAL HIGH (ref 4.8–5.6)

## 2021-11-21 LAB — LIPID PANEL WITH LDL/HDL RATIO
Cholesterol, Total: 166 mg/dL (ref 100–199)
HDL: 44 mg/dL (ref >39)
LDL Chol Calc (NIH): 101 mg/dL — ABNORMAL HIGH (ref 0–99)
LDL/HDL Ratio: 2.3 ratio (ref 0.0–3.2)
Triglycerides: 119 mg/dL (ref 0–149)
VLDL Cholesterol Cal: 21 mg/dL (ref 5–40)

## 2021-11-21 LAB — VITAMIN D 25 HYDROXY (VIT D DEFICIENCY, FRACTURES): Vit D, 25-Hydroxy: 72.8 ng/mL (ref 30.0–100.0)

## 2021-11-21 LAB — INSULIN, RANDOM: INSULIN: 60.8 u[IU]/mL — ABNORMAL HIGH (ref 2.6–24.9)

## 2021-11-21 LAB — MAGNESIUM: Magnesium: 2 mg/dL (ref 1.6–2.3)

## 2021-11-21 NOTE — Telephone Encounter (Signed)
[name] 

## 2021-11-21 NOTE — Telephone Encounter (Signed)
Attempted to call patient. Call would not go through.

## 2021-11-28 ENCOUNTER — Encounter (INDEPENDENT_AMBULATORY_CARE_PROVIDER_SITE_OTHER): Payer: Self-pay | Admitting: Family Medicine

## 2021-11-28 NOTE — Telephone Encounter (Signed)
Please advise 

## 2021-11-28 NOTE — Telephone Encounter (Signed)
[name] 

## 2021-11-30 ENCOUNTER — Other Ambulatory Visit: Payer: Self-pay

## 2021-11-30 ENCOUNTER — Ambulatory Visit (INDEPENDENT_AMBULATORY_CARE_PROVIDER_SITE_OTHER): Payer: Medicaid Other | Admitting: Family Medicine

## 2021-11-30 ENCOUNTER — Encounter: Payer: Self-pay | Admitting: Family Medicine

## 2021-11-30 VITALS — BP 131/75 | HR 75 | Ht 63.0 in | Wt 299.2 lb

## 2021-11-30 DIAGNOSIS — R002 Palpitations: Secondary | ICD-10-CM

## 2021-11-30 DIAGNOSIS — E785 Hyperlipidemia, unspecified: Secondary | ICD-10-CM | POA: Diagnosis not present

## 2021-11-30 DIAGNOSIS — R008 Other abnormalities of heart beat: Secondary | ICD-10-CM | POA: Diagnosis not present

## 2021-11-30 DIAGNOSIS — J449 Chronic obstructive pulmonary disease, unspecified: Secondary | ICD-10-CM | POA: Diagnosis not present

## 2021-11-30 DIAGNOSIS — Z6841 Body Mass Index (BMI) 40.0 and over, adult: Secondary | ICD-10-CM

## 2021-11-30 MED ORDER — ROSUVASTATIN CALCIUM 20 MG PO TABS: 20.0000 mg | ORAL_TABLET | Freq: Every day | ORAL | 1 refills | Status: DC

## 2021-11-30 NOTE — Assessment & Plan Note (Signed)
Lab reviewed. LDL above goal in patient with PreDM. I discussed increasing her Crestor to 20 mg qd. She agreed with the plan. Dose increased.

## 2021-11-30 NOTE — Assessment & Plan Note (Signed)
Improved since she d/ced Dulera No symptoms in over 2 weeks. EKG without ischemic changes and seems regular, although lots of artifacts.  ECHO ordered and was scheduled during this visit. Defer cards  referral and holter monitoring for now. Consider in the future if symptoms reoccurs. She agreed with the plan.

## 2021-11-30 NOTE — Assessment & Plan Note (Signed)
Great compliant with exercise and dieting. Monitor closely.

## 2021-11-30 NOTE — Patient Instructions (Signed)
Palpitations Palpitations are feelings that your heartbeat is not normal. Your heartbeat may feel like it is: Uneven (irregular). Faster than normal. Fluttering. Skipping a beat. This is usually not a serious problem. However, a doctor will do tests and check your medical history to make sure that you do not have a serious heart problem. Follow these instructions at home: Watch for any changes in your condition. Tell your doctor about any changes. Take these actions to help manage your symptoms: Eating and drinking Follow instructions from your doctor about things to eat and drink. You may be told to avoid these things: Drinks that have caffeine in them, such as coffee, tea, soft drinks, and energy drinks. Chocolate. Alcohol. Diet pills. Lifestyle   Try to lower your stress. These things can help you relax: Yoga. Deep breathing and meditation. Guided imagery. This is using words and images to create positive thoughts. Exercise, including swimming, jogging, and walking. Tell your doctor if you have more abnormal heartbeats when you are active. If you have chest pain or feel short of breath with exercise, do not keep doing the exercise until you are seen by your doctor. Biofeedback. This is using your mind to control things in your body, such as your heartbeat. Get plenty of rest and sleep. Keep a regular bed time. Do not use drugs, such as cocaine or ecstasy. Do not use marijuana. Do not smoke or use any products that contain nicotine or tobacco. If you need help quitting, ask your doctor. General instructions Take over-the-counter and prescription medicines only as told by your doctor. Keep all follow-up visits. You may need more tests if palpitations do not go away or get worse. Contact a doctor if: You keep having fast or uneven heartbeats for a long time. Your symptoms happen more often. Get help right away if: You have chest pain. You feel short of breath. You have a very bad  headache. You feel dizzy. You faint. These symptoms may be an emergency. Get help right away. Call your local emergency services (911 in the [place] Do not wait to see if the symptoms will go away. Do not drive yourself to the hospital. Summary Palpitations are feelings that your heartbeat is uneven or faster than normal. It may feel like your heart is fluttering or skipping a beat. Avoid food and drinks that may cause this condition. These include caffeine, chocolate, and alcohol. Try to lower your stress. Do not smoke or use drugs. Get help right away if you faint, feel dizzy, feel short of breath, have chest pain, or have a very bad headache. This information is not intended to replace advice given to you by your health care provider. Make sure you discuss any questions you have with your health care provider. Document Revised: 03/14/2021 Document Reviewed: 03/14/2021 Elsevier Patient Education  [place].

## 2021-11-30 NOTE — Progress Notes (Signed)
° ° ° °  SUBJECTIVE:   CHIEF COMPLAINT / HPI:   Palpitation: The patient continues to have intermittent palpitations after stopping the daily use of Albuterol. 2 weeks ago, she d/ced her Advair, and her palpitations ceased altogether. She denies chest pain and no SOB. She feels well in general.   HLD: She had lab work done at [facility]. She is compliant with Crestor 10 mg QD. She is here for follow-up.  COPD: She has been off Dulera for 2 weeks due to palpitations. She denies chest pain, tightness or SOB.  Weight management: She is compliant with weight management clinic f/u. She takes 10 min walk twice weekly.  PERTINENT  PMH / PSH: PMHx reviewed  OBJECTIVE:   Vitals:   11/30/21 0932  BP: 131/75  Pulse: 75  SpO2: 99%  Weight: 299 lb 3.2 oz (135.7 kg)  Height: 5\' 3"  (1.6 m)    Physical Exam Vitals and nursing note reviewed.  Cardiovascular:     Rate and Rhythm: Normal rate and regular rhythm.     Pulses: Normal pulses.     Heart sounds: Normal heart sounds.  Pulmonary:     Effort: No respiratory distress.     Breath sounds: Normal breath sounds. No wheezing.  Musculoskeletal:     Right lower leg: No edema.     Left lower leg: No edema.     ASSESSMENT/PLAN:   Palpitation Improved since she d/ced Dulera No symptoms in over 2 weeks. EKG without ischemic changes and seems regular, although lots of artifacts.  ECHO ordered and was scheduled during this visit. Defer cards  referral and holter monitoring for now. Consider in the future if symptoms reoccurs. She agreed with the plan.  Hyperlipidemia Lab reviewed. LDL above goal in patient with PreDM. I discussed increasing her Crestor to 20 mg qd. She agreed with the plan. Dose increased.  COPD, moderate ([place]) I encouraged getting back on Dulera or switching to a different agent. She declined both options for now. Monitor closely on albuterol prn for now. She will return to discuss in the future if  symptomatic.  Class 3 severe obesity with serious comorbidity and body mass index (BMI) of 50.0 to 59.9 in adult [facility]) Great compliant with exercise and dieting. Monitor closely.   She declined flu shot  Andrena Mews, MD [place]

## 2021-11-30 NOTE — Assessment & Plan Note (Signed)
I encouraged getting back on Dulera or switching to a different agent. She declined both options for now. Monitor closely on albuterol prn for now. She will return to discuss in the future if symptomatic.

## 2021-12-03 DIAGNOSIS — H3589 Other specified retinal disorders: Secondary | ICD-10-CM | POA: Diagnosis not present

## 2021-12-03 DIAGNOSIS — H35413 Lattice degeneration of retina, bilateral: Secondary | ICD-10-CM | POA: Diagnosis not present

## 2021-12-03 DIAGNOSIS — H31092 Other chorioretinal scars, left eye: Secondary | ICD-10-CM | POA: Diagnosis not present

## 2021-12-03 DIAGNOSIS — H33321 Round hole, right eye: Secondary | ICD-10-CM | POA: Diagnosis not present

## 2021-12-05 ENCOUNTER — Encounter: Payer: Self-pay | Admitting: Family Medicine

## 2021-12-05 ENCOUNTER — Other Ambulatory Visit: Payer: Self-pay | Admitting: Family Medicine

## 2021-12-05 ENCOUNTER — Other Ambulatory Visit: Payer: Self-pay

## 2021-12-05 ENCOUNTER — Ambulatory Visit (HOSPITAL_COMMUNITY)
Admission: RE | Admit: 2021-12-05 | Discharge: 2021-12-05 | Disposition: A | Discharge status: Home / Self Care | Payer: Medicaid Other | Source: Ambulatory Visit | Attending: Family Medicine | Admitting: Family Medicine

## 2021-12-05 DIAGNOSIS — E785 Hyperlipidemia, unspecified: Secondary | ICD-10-CM | POA: Diagnosis not present

## 2021-12-05 DIAGNOSIS — I10 Essential (primary) hypertension: Secondary | ICD-10-CM | POA: Insufficient documentation

## 2021-12-05 DIAGNOSIS — F17210 Nicotine dependence, cigarettes, uncomplicated: Secondary | ICD-10-CM | POA: Insufficient documentation

## 2021-12-05 DIAGNOSIS — J449 Chronic obstructive pulmonary disease, unspecified: Secondary | ICD-10-CM | POA: Insufficient documentation

## 2021-12-05 DIAGNOSIS — R002 Palpitations: Secondary | ICD-10-CM | POA: Insufficient documentation

## 2021-12-05 DIAGNOSIS — R008 Other abnormalities of heart beat: Secondary | ICD-10-CM | POA: Diagnosis not present

## 2021-12-05 LAB — ECHOCARDIOGRAM COMPLETE
Area-P 1/2: 2.87 cm2
S' Lateral: 3.6 cm

## 2021-12-06 ENCOUNTER — Telehealth: Payer: Self-pay | Admitting: Family Medicine

## 2021-12-06 NOTE — Telephone Encounter (Signed)
ECHO discussed. EF within range RAP one point elevated. Unclear clinical significance at this point. We will monitor. She agreed with the plan.

## 2021-12-11 ENCOUNTER — Encounter (INDEPENDENT_AMBULATORY_CARE_PROVIDER_SITE_OTHER): Payer: Self-pay | Admitting: Family Medicine

## 2021-12-11 ENCOUNTER — Other Ambulatory Visit: Payer: Self-pay

## 2021-12-11 ENCOUNTER — Ambulatory Visit (INDEPENDENT_AMBULATORY_CARE_PROVIDER_SITE_OTHER): Payer: Medicaid Other | Admitting: Family Medicine

## 2021-12-11 VITALS — BP 135/69 | HR 67 | Temp 97.9°F | Ht 63.0 in | Wt 297.0 lb

## 2021-12-11 DIAGNOSIS — I10 Essential (primary) hypertension: Secondary | ICD-10-CM

## 2021-12-11 DIAGNOSIS — R7303 Prediabetes: Secondary | ICD-10-CM | POA: Diagnosis not present

## 2021-12-11 DIAGNOSIS — E559 Vitamin D deficiency, unspecified: Secondary | ICD-10-CM | POA: Diagnosis not present

## 2021-12-11 DIAGNOSIS — E669 Obesity, unspecified: Secondary | ICD-10-CM

## 2021-12-11 DIAGNOSIS — Z6841 Body Mass Index (BMI) 40.0 and over, adult: Secondary | ICD-10-CM | POA: Diagnosis not present

## 2021-12-11 NOTE — Progress Notes (Signed)
Chief Complaint:   OBESITY Carla Hanson is here to discuss her progress with her obesity treatment plan along with follow-up of her obesity related diagnoses. Carla Hanson is on the Category 3 Plan and keeping a food journal and adhering to recommended goals of 250-350 calories and 20 grams of protein at breakfast and states she is following her eating plan approximately 95% of the time. Carla Hanson states she is doing cardio, weights, and walking for 30-45 minutes 5-6 times per week.  Today's visit was #: 23 Starting weight: 324 lbs Starting date: 01/03/2021 Today's weight: 297 lbs Today's date: 12/11/2021 Total lbs lost to date: 27 lbs Total lbs lost since last in-office visit: 0  Interim History: Carla Hanson notes is retaining some fluid in her feet and legs. She reports increase in salty snacks. She is still working on getting in the prescribed protein.Mar[place] Kitchen Her hunger is satisfied. She finds it hard to get in 8 ounces of meat at dinner.  Subjective:   Palpitations Carla Hanson's palpitations have subsided.  She followed up with her PCP and had an echo done recently which was normal.  Vitamin D deficiency Carla Hanson is on OTC. Vit D 50,000 weekly.  Vitamin D is at goal.  We discussed labs today.   Lab Results  Component Value Date   VD25OH 72.8 11/20/2021   VD25OH 42.6 05/15/2021   VD25OH 13.7 (L) 12/19/2020    Essential hypertension Carla Hanson's blood pressure is well controlled. She is on HCTZ 25 mg and Losartan 50 mg daily.  BP Readings from Last 3 Encounters:  12/11/21 135/69  11/30/21 131/75  11/20/21 114/78    Assessment/Plan:   Palpitations She will follow-up with her PCP if palpitations recur.   Vitamin D deficiency . Carla Hanson will decrease frequency of Vitamin D to every other week to avoid over replacement.  Essential hypertension Carla Hanson will continue Losartan and HCTZ.   4. Obesity: Current BMI 52.62 Carla Hanson is currently in the action stage of change. As such, her goal is to continue with  weight loss efforts. She has agreed to the Category 3 Plan.   Carla Hanson will continue to work on increasing protein intake. She has protein equivalent list.   Exercise goals:  As is.  Behavioral modification strategies: increasing lean protein intake and decreasing sodium intake.  Carla Hanson has agreed to follow-up with our clinic in 3 weeks with Dr. Leafy Ro and 6 weeks with myself ( My Chart visit)  Objective:   Blood pressure 135/69, pulse 67, temperature 97.9 F (36.6 C), height 5\' 3"  (1.6 m), weight 297 lb (134.7 kg), SpO2 98 %. Body mass index is 52.61 kg/m.  General: Cooperative, alert, well developed, in no acute distress. HEENT: Conjunctivae and lids unremarkable. Cardiovascular: Regular rhythm.  Lungs: Normal work of breathing. Neurologic: No focal deficits.   Lab Results  Component Value Date   CREATININE 0.66 11/20/2021   BUN 16 11/20/2021   NA 142 11/20/2021   K 4.0 11/20/2021   CL 101 11/20/2021   CO2 26 11/20/2021   Lab Results  Component Value Date   ALT 12 11/20/2021   AST 11 11/20/2021   ALKPHOS 58 11/20/2021   BILITOT 0.3 11/20/2021   Lab Results  Component Value Date   HGBA1C 6.2 (H) 11/20/2021   HGBA1C 6.3 (H) 05/21/2021   HGBA1C 6.4 (A) 12/19/2020   HGBA1C 6.1 04/11/2020   HGBA1C 6.0 08/27/2019   Lab Results  Component Value Date   INSULIN 60.8 (H) 11/20/2021   INSULIN 64.3 (H) 05/21/2021  INSULIN 77.3 (H) 01/03/2021   Lab Results  Component Value Date   TSH 1.250 01/03/2021   Lab Results  Component Value Date   CHOL 166 11/20/2021   HDL 44 11/20/2021   LDLCALC 101 (H) 11/20/2021   TRIG 119 11/20/2021   CHOLHDL 5.7 (H) 05/15/2021   Lab Results  Component Value Date   VD25OH 72.8 11/20/2021   VD25OH 42.6 05/15/2021   VD25OH 13.7 (L) 12/19/2020   Lab Results  Component Value Date   WBC 8.8 01/03/2021   HGB 13.2 01/03/2021   HCT 41.4 01/03/2021   MCV 79 01/03/2021   PLT 346 01/03/2021   Lab Results  Component Value Date    IRON 32 12/03/2016   TIBC 363 12/03/2016   FERRITIN 13 (L) 02/09/2018   Attestation Statements:   Reviewed by clinician on day of visit: allergies, medications, problem list, medical history, surgical history, family history, social history, and previous encounter notes.  Time spent on visit including pre-visit chart review and post-visit care and charting was 31 minutes.   I, Lizbeth Bark, RMA, am acting as Location manager for [employer], [place].  I have reviewed the above documentation for accuracy and completeness, and I agree with the above. -  Georgianne Fick, FNP

## 2021-12-18 DIAGNOSIS — H31092 Other chorioretinal scars, left eye: Secondary | ICD-10-CM | POA: Diagnosis not present

## 2021-12-19 DIAGNOSIS — H524 Presbyopia: Secondary | ICD-10-CM | POA: Diagnosis not present

## 2022-01-08 ENCOUNTER — Ambulatory Visit (INDEPENDENT_AMBULATORY_CARE_PROVIDER_SITE_OTHER): Payer: Medicaid Other | Admitting: Family Medicine

## 2022-01-21 ENCOUNTER — Other Ambulatory Visit: Payer: Self-pay | Admitting: Family Medicine

## 2022-01-28 NOTE — Progress Notes (Signed)
?TeleHealth Visit:  ?Due to the COVID-19 pandemic, this visit was completed with telemedicine (audio/video) technology to reduce patient and provider exposure as well as to preserve personal protective equipment.  ? ?[name] has verbally consented to this TeleHealth visit. The patient is located at home, the provider is located at home. The participants in this visit include the listed provider and patient. The visit was conducted today via MyChart video. ? ?OBESITY ?Carla Hanson is here to discuss her progress with her obesity treatment plan along with follow-up of her obesity related diagnoses.  ? ?Today's visit was # 20 ?Starting weight: 324 lbs ?Starting date: 01/03/21 ?Weight at last in person visit: 297 lbs. ?Today's reported weight: 297 lbs ? ?Nutrition Plan: the Category 3 Plan. Has adhered to plan about 85% of time.  ?Hunger is moderately controlled. Cravings are poorly controlled.  ?Current exercise:  HIIT training/weights 5 days per  week for 45 minutes. ? ?Interim History: Carla Hanson's last office visit was 6 weeks ago.  Her weight reported today is the same as her last in office visit.  She has been having a lot of family stress and has really struggled over the past several weeks.  She says she has skipped meals, lacked protein in her diet, and been having some sugar sweetened beverages (ice coffee).  She reports her sweets cravings have been intense.   ?She has been very consistent with her exercise however. ?She is restarting the plan this week. ? ?Assessment/Plan:  ?Peripheral Edema ?[name] has issues with lower extremity edema.  She had a recent echo which was normal.  She reports that swelling worsens throughout the day but then improves overnight.  She is on HCTZ 25 mg daily. ?Plan: ?Refill HCTZ 25 mg daily. ? ?Other depression with emotional eating ?She notes significant emotional eating over the past several weeks.  She is having severe sweets cravings.  She has been very stressed because she had  to kick her son out of the house due to cocaine addiction. ?Plan: ?New prescription: Bupropion 150 mg daily in the morning.  She denies history of glaucoma.  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 ?Discussed referral to Dr. Mallie Mussel and she may consider this. ? ? ?3. Obesity: Current BMI 52.62 ?Carla Hanson is currently in the action stage of change. As such, her goal is to continue with weight loss efforts. She has agreed to the Category 3 Plan.  ? ?Exercise goals:  Continue current regimen of [place] training/resistance training 5 days/week for 45 minutes each day. ? ?Behavioral modification strategies: increasing lean protein intake, decreasing simple carbohydrates, decreasing liquid calories, no skipping meals, and emotional eating strategies. ? ?[name] has agreed to follow-up with our clinic in 5 weeks with Dr. Leafy Ro and 8 weeks with me. ? ?Madhuri wants to alternate virtual and in office visits. ? ?No orders of the defined types were placed in this encounter. ? ? ?Medications Discontinued During This Encounter  ?Medication Reason  ? hydrochlorothiazide (HYDRODIURIL) 25 MG tablet Reorder  ?  ? ?Meds ordered this encounter  ?Medications  ? hydrochlorothiazide (HYDRODIURIL) 25 MG tablet  ?  Sig: Take 1 tablet (25 mg total) by mouth daily.  ?  Dispense:  90 tablet  ?  Refill:  0  ?  Order Specific Question:   Supervising Provider  ?  Answer:   Dennard Nip D [[id number]]  ? buPROPion (WELLBUTRIN SR) 150 MG 12 hr tablet  ?  Sig: Take 1 tablet (150  mg total) by mouth daily with breakfast.  ?  Dispense:  30 tablet  ?  Refill:  0  ?  Order Specific Question:   Supervising Provider  ?  Answer:   Dennard Nip D [[id number]]  ?   ? ?Objective:  ? ?VITALS: Per patient if applicable, see vitals. ?GENERAL: Alert and in no acute distress. ?CARDIOPULMONARY: No increased WOB. Speaking in clear sentences.  ?PSYCH: Pleasant and cooperative. Speech normal rate and rhythm. Affect is appropriate. Insight and  judgement are appropriate. Attention is focused, linear, and appropriate.  ?NEURO: Oriented as arrived to appointment on time with no prompting.  ? ?Lab Results  ?Component Value Date  ? CREATININE 0.66 11/20/2021  ? BUN 16 11/20/2021  ? NA 142 11/20/2021  ? K 4.0 11/20/2021  ? CL 101 11/20/2021  ? CO2 26 11/20/2021  ? ?Lab Results  ?Component Value Date  ? ALT 12 11/20/2021  ? AST 11 11/20/2021  ? ALKPHOS 58 11/20/2021  ? BILITOT 0.3 11/20/2021  ? ?Lab Results  ?Component Value Date  ? HGBA1C 6.2 (H) 11/20/2021  ? HGBA1C 6.3 (H) 05/21/2021  ? HGBA1C 6.4 (A) 12/19/2020  ? HGBA1C 6.1 04/11/2020  ? HGBA1C 6.0 08/27/2019  ? ?Lab Results  ?Component Value Date  ? INSULIN 60.8 (H) 11/20/2021  ? INSULIN 64.3 (H) 05/21/2021  ? INSULIN 77.3 (H) 01/03/2021  ? ?Lab Results  ?Component Value Date  ? TSH 1.250 01/03/2021  ? ?Lab Results  ?Component Value Date  ? CHOL 166 11/20/2021  ? HDL 44 11/20/2021  ? LDLCALC 101 (H) 11/20/2021  ? TRIG 119 11/20/2021  ? CHOLHDL 5.7 (H) 05/15/2021  ? ?Lab Results  ?Component Value Date  ? WBC 8.8 01/03/2021  ? HGB 13.2 01/03/2021  ? HCT 41.4 01/03/2021  ? MCV 79 01/03/2021  ? PLT 346 01/03/2021  ? ?Lab Results  ?Component Value Date  ? IRON 32 12/03/2016  ? TIBC 363 12/03/2016  ? FERRITIN 13 (L) 02/09/2018  ? ?Lab Results  ?Component Value Date  ? VD25OH 72.8 11/20/2021  ? VD25OH 42.6 05/15/2021  ? VD25OH 13.7 (L) 12/19/2020  ? ? ?Attestation Statements:  ? ?Reviewed by clinician on day of visit: allergies, medications, problem list, medical history, surgical history, family history, social history, and previous encounter notes. ? ? ?

## 2022-01-29 ENCOUNTER — Telehealth (INDEPENDENT_AMBULATORY_CARE_PROVIDER_SITE_OTHER): Payer: Medicaid Other | Admitting: Family Medicine

## 2022-01-29 ENCOUNTER — Encounter (INDEPENDENT_AMBULATORY_CARE_PROVIDER_SITE_OTHER): Payer: Self-pay | Admitting: Family Medicine

## 2022-01-29 DIAGNOSIS — H35373 Puckering of macula, bilateral: Secondary | ICD-10-CM | POA: Diagnosis not present

## 2022-01-29 DIAGNOSIS — H35413 Lattice degeneration of retina, bilateral: Secondary | ICD-10-CM | POA: Diagnosis not present

## 2022-01-29 DIAGNOSIS — R609 Edema, unspecified: Secondary | ICD-10-CM | POA: Diagnosis not present

## 2022-01-29 DIAGNOSIS — H31092 Other chorioretinal scars, left eye: Secondary | ICD-10-CM | POA: Diagnosis not present

## 2022-01-29 DIAGNOSIS — F3289 Other specified depressive episodes: Secondary | ICD-10-CM | POA: Diagnosis not present

## 2022-01-29 DIAGNOSIS — Z6841 Body Mass Index (BMI) 40.0 and over, adult: Secondary | ICD-10-CM

## 2022-01-29 DIAGNOSIS — E669 Obesity, unspecified: Secondary | ICD-10-CM

## 2022-01-29 DIAGNOSIS — H43813 Vitreous degeneration, bilateral: Secondary | ICD-10-CM | POA: Diagnosis not present

## 2022-01-29 DIAGNOSIS — R600 Localized edema: Secondary | ICD-10-CM

## 2022-01-29 MED ORDER — HYDROCHLOROTHIAZIDE 25 MG PO TABS: 25.0000 mg | ORAL_TABLET | Freq: Every day | ORAL | 0 refills | Status: DC

## 2022-01-29 MED ORDER — BUPROPION HCL ER (SR) 150 MG PO TB12: 150.0000 mg | ORAL_TABLET | Freq: Every day | ORAL | 0 refills | Status: DC

## 2022-01-30 ENCOUNTER — Other Ambulatory Visit: Payer: Self-pay | Admitting: Family Medicine

## 2022-01-30 ENCOUNTER — Other Ambulatory Visit (INDEPENDENT_AMBULATORY_CARE_PROVIDER_SITE_OTHER): Payer: Self-pay | Admitting: Family Medicine

## 2022-01-30 DIAGNOSIS — R609 Edema, unspecified: Secondary | ICD-10-CM

## 2022-01-30 DIAGNOSIS — J449 Chronic obstructive pulmonary disease, unspecified: Secondary | ICD-10-CM

## 2022-01-30 DIAGNOSIS — F3289 Other specified depressive episodes: Secondary | ICD-10-CM

## 2022-01-31 ENCOUNTER — Other Ambulatory Visit: Payer: Self-pay | Admitting: Family Medicine

## 2022-01-31 DIAGNOSIS — J449 Chronic obstructive pulmonary disease, unspecified: Secondary | ICD-10-CM

## 2022-02-07 ENCOUNTER — Ambulatory Visit (INDEPENDENT_AMBULATORY_CARE_PROVIDER_SITE_OTHER): Payer: Medicaid Other | Admitting: Family Medicine

## 2022-02-19 ENCOUNTER — Ambulatory Visit: Payer: Medicaid Other | Admitting: Family Medicine

## 2022-02-21 ENCOUNTER — Other Ambulatory Visit (INDEPENDENT_AMBULATORY_CARE_PROVIDER_SITE_OTHER): Payer: Self-pay | Admitting: Family Medicine

## 2022-02-21 DIAGNOSIS — R7303 Prediabetes: Secondary | ICD-10-CM

## 2022-02-26 ENCOUNTER — Encounter: Payer: Self-pay | Admitting: Family Medicine

## 2022-02-26 ENCOUNTER — Ambulatory Visit (INDEPENDENT_AMBULATORY_CARE_PROVIDER_SITE_OTHER): Payer: Medicaid Other | Admitting: Family Medicine

## 2022-02-26 VITALS — BP 140/81 | HR 71 | Ht 64.0 in | Wt 301.1 lb

## 2022-02-26 DIAGNOSIS — L821 Other seborrheic keratosis: Secondary | ICD-10-CM | POA: Insufficient documentation

## 2022-02-26 DIAGNOSIS — I10 Essential (primary) hypertension: Secondary | ICD-10-CM | POA: Diagnosis not present

## 2022-02-26 DIAGNOSIS — L918 Other hypertrophic disorders of the skin: Secondary | ICD-10-CM | POA: Diagnosis not present

## 2022-02-26 NOTE — Progress Notes (Signed)
? ? ?  SUBJECTIVE:  ? ?CHIEF COMPLAINT / HPI:  ? ?Skin lesions: ?She c/o a flat lesion under her left breast, which she noticed a few weeks ago. This is asymptomatic and denies any change in color or size. She also asked if she could remove the skin tag on her right eye. ? ?HTN: ?Here systolic BP was slightly elevated. She is compliant with her meds. No other concerns.  ? ?PERTINENT  PMH / PSH: PMHx reviewed ? ?OBJECTIVE:  ? ?Vitals:  ? 02/26/22 0829 02/26/22 0845  ?BP: (!) 147/74 140/81  ?Pulse: 71   ?SpO2: 100%   ?Weight: (!) 301 lb 2 oz (136.6 kg)   ?Height: '5\' 4"'$  (1.626 m)   ? ? ?Physical Exam ?Vitals and nursing note reviewed. Chaperone present: Tashira Legette.  ?Cardiovascular:  ?   Rate and Rhythm: Normal rate and regular rhythm.  ?   Heart sounds: Normal heart sounds. No murmur heard. ?Pulmonary:  ?   Effort: Pulmonary effort is normal. No respiratory distress.  ?   Breath sounds: Normal breath sounds. No wheezing.  ?Chest:  ?   Comments: Mildly hypopigmented, round, slightly raised, rough surfaced skin mole of  her left breast crease. ? ?Small skin tag on the temporal region of her right eye ? ? ? ?ASSESSMENT/PLAN:  ? ?Seborrheic keratoses ?Of her left breast crease. ?I reassured her that this is benign. ?Treatment options including monitoring discussed. ?She prefers cryotherapy. ?She make dermatology appointment for treatment. ?Her skin tag would be addressed at the same time.  ? ?Essential hypertension ?Her BP improved after I rechecked it. ?Continue current home regimen. ?Monitor BP closely at home.  ?  ? ? ?Andrena Mews, MD ?[place]  ? ?

## 2022-02-26 NOTE — Patient Instructions (Signed)
Seborrheic Keratosis A seborrheic keratosis is a common, noncancerous (benign) skin growth. These growths are velvety, waxy, rough, tan, brown, or black spots that appear on the skin. These skin growths can be flat or raised, and scaly. What are the causes? The cause of this condition is not known. What increases the risk? You are more likely to develop this condition if you: Have a family history of seborrheic keratosis. Are 50 or older. Are pregnant. Have had estrogen replacement therapy. What are the signs or symptoms? Symptoms of this condition include growths on the face, chest, shoulders, back, or other areas. These growths: Are usually painless, but may become irritated and itchy. Can be yellow, brown, black, or other colors. Are slightly raised or have a flat surface. Are sometimes rough or wart-like in texture. Are often velvety or waxy on the surface. Are round or oval-shaped. Often occur in groups, but may occur as a single growth. How is this diagnosed? This condition is diagnosed with a medical history and physical exam. A sample of the growth may be tested (skin biopsy). You may need to see a skin specialist (dermatologist). How is this treated? Treatment is not usually needed for this condition, unless the growths are irritated or bleed often. You may also choose to have the growths removed if you do not like their appearance. Most commonly, these growths are treated with a procedure in which liquid nitrogen is applied to "freeze" off the growth (cryosurgery). They may also be burned off with electricity (electrocautery) or removed by scraping (curettage). Follow these instructions at home: Watch your growth for any changes. Keep all follow-up visits as told by your health care provider. This is important. Do not scratch or pick at the growth or growths. This can cause them to become irritated or infected. Contact a health care provider if: You suddenly have many new  growths. Your growth bleeds, itches, or hurts. Your growth suddenly becomes larger or changes color. Summary A seborrheic keratosis is a common, noncancerous (benign) skin growth. Treatment is not usually needed for this condition, unless the growths are irritated or bleed often. Watch your growth for any changes. Contact a health care provider if you suddenly have many new growths or your growth suddenly becomes larger or changes color. Keep all follow-up visits as told by your health care provider. This is important. This information is not intended to replace advice given to you by your health care provider. Make sure you discuss any questions you have with your health care provider. Document Revised: 08/15/2021 Document Reviewed: 08/15/2021 Elsevier Patient Education  2023 Elsevier Inc.  

## 2022-02-26 NOTE — Assessment & Plan Note (Signed)
Her BP improved after I rechecked it. ?Continue current home regimen. ?Monitor BP closely at home.  ?

## 2022-02-26 NOTE — Assessment & Plan Note (Signed)
Of her left breast crease. ?I reassured her that this is benign. ?Treatment options including monitoring discussed. ?She prefers cryotherapy. ?She make dermatology appointment for treatment. ?Her skin tag would be addressed at the same time.  ?

## 2022-03-04 ENCOUNTER — Encounter (INDEPENDENT_AMBULATORY_CARE_PROVIDER_SITE_OTHER): Payer: Self-pay | Admitting: Family Medicine

## 2022-03-04 ENCOUNTER — Other Ambulatory Visit (INDEPENDENT_AMBULATORY_CARE_PROVIDER_SITE_OTHER): Payer: Self-pay | Admitting: Family Medicine

## 2022-03-04 ENCOUNTER — Ambulatory Visit (INDEPENDENT_AMBULATORY_CARE_PROVIDER_SITE_OTHER): Payer: Medicaid Other | Admitting: Family Medicine

## 2022-03-04 VITALS — BP 121/66 | HR 72 | Temp 97.9°F | Ht 63.0 in | Wt 298.0 lb

## 2022-03-04 DIAGNOSIS — E7849 Other hyperlipidemia: Secondary | ICD-10-CM

## 2022-03-04 DIAGNOSIS — E669 Obesity, unspecified: Secondary | ICD-10-CM | POA: Diagnosis not present

## 2022-03-04 DIAGNOSIS — E559 Vitamin D deficiency, unspecified: Secondary | ICD-10-CM

## 2022-03-04 DIAGNOSIS — E785 Hyperlipidemia, unspecified: Secondary | ICD-10-CM | POA: Diagnosis not present

## 2022-03-04 DIAGNOSIS — R7303 Prediabetes: Secondary | ICD-10-CM

## 2022-03-04 DIAGNOSIS — Z6841 Body Mass Index (BMI) 40.0 and over, adult: Secondary | ICD-10-CM

## 2022-03-04 DIAGNOSIS — E66813 Obesity, class 3: Secondary | ICD-10-CM

## 2022-03-04 MED ORDER — ROSUVASTATIN CALCIUM 20 MG PO TABS: 20.0000 mg | ORAL_TABLET | Freq: Every day | ORAL | 0 refills | Status: DC

## 2022-03-04 MED ORDER — METFORMIN HCL 500 MG PO TABS: 500.0000 mg | ORAL_TABLET | Freq: Two times a day (BID) | ORAL | 0 refills | Status: DC

## 2022-03-05 ENCOUNTER — Encounter (INDEPENDENT_AMBULATORY_CARE_PROVIDER_SITE_OTHER): Payer: Self-pay | Admitting: Family Medicine

## 2022-03-05 ENCOUNTER — Other Ambulatory Visit: Payer: Self-pay | Admitting: Family Medicine

## 2022-03-05 DIAGNOSIS — E7849 Other hyperlipidemia: Secondary | ICD-10-CM

## 2022-03-05 LAB — LIPID PANEL WITH LDL/HDL RATIO
Cholesterol, Total: 149 mg/dL (ref 100–199)
HDL: 46 mg/dL (ref >39)
LDL Chol Calc (NIH): 86 mg/dL (ref 0–99)
LDL/HDL Ratio: 1.9 ratio (ref 0.0–3.2)
Triglycerides: 92 mg/dL (ref 0–149)
VLDL Cholesterol Cal: 17 mg/dL (ref 5–40)

## 2022-03-05 LAB — CMP14+EGFR
ALT: 19 IU/L (ref 0–32)
AST: 16 IU/L (ref 0–40)
Albumin/Globulin Ratio: 1.5 (ref 1.2–2.2)
Albumin: 4.4 g/dL (ref 3.8–4.9)
Alkaline Phosphatase: 51 IU/L (ref 44–121)
BUN/Creatinine Ratio: 25 — ABNORMAL HIGH (ref 9–23)
BUN: 15 mg/dL (ref 6–24)
Bilirubin Total: 0.3 mg/dL (ref 0.0–1.2)
CO2: 26 mmol/L (ref 20–29)
Calcium: 9.6 mg/dL (ref 8.7–10.2)
Chloride: 101 mmol/L (ref 96–106)
Creatinine, Ser: 0.6 mg/dL (ref 0.57–1.00)
Globulin, Total: 2.9 g/dL (ref 1.5–4.5)
Glucose: 107 mg/dL — ABNORMAL HIGH (ref 70–99)
Potassium: 3.8 mmol/L (ref 3.5–5.2)
Sodium: 141 mmol/L (ref 134–144)
Total Protein: 7.3 g/dL (ref 6.0–8.5)
eGFR: 106 mL/min/{1.73_m2} (ref >59)

## 2022-03-05 LAB — VITAMIN D 25 HYDROXY (VIT D DEFICIENCY, FRACTURES): Vit D, 25-Hydroxy: 57.1 ng/mL (ref 30.0–100.0)

## 2022-03-05 LAB — HEMOGLOBIN A1C
Est. average glucose Bld gHb Est-mCnc: 128 mg/dL
Hgb A1c MFr Bld: 6.1 % — ABNORMAL HIGH (ref 4.8–5.6)

## 2022-03-05 LAB — TSH: TSH: 1.46 u[IU]/mL (ref 0.450–4.500)

## 2022-03-05 LAB — INSULIN, RANDOM: INSULIN: 81 u[IU]/mL — ABNORMAL HIGH (ref 2.6–24.9)

## 2022-03-05 NOTE — Telephone Encounter (Signed)
Dr.Beasley 

## 2022-03-19 NOTE — Progress Notes (Signed)
Chief Complaint:   OBESITY Carla Hanson is here to discuss her progress with her obesity treatment plan along with follow-up of her obesity related diagnoses. Carla Hanson is on the Category 3 Plan and states she is following her eating plan approximately 90% of the time. Carla Hanson states she is walking and doing cardio and weights for 60 minutes 5-6 times per week.  Today's visit was #: 21 Starting weight: 324 lbs Starting date: 01/03/2021 Today's weight: 298 lbs Today's date: 03/04/2022 Total lbs lost to date: 26 Total lbs lost since last in-office visit: 0  Interim History: Carla Hanson has struggled in the last month. She has done more emotional eating behaviors, but she is working on getting back on track. She is back on the Category 3.  Subjective:   1. Pre-diabetes Carla Hanson is stable on metformin, and she is working on diet and weight loss. No side effects noted.   2. Other hyperlipidemia Carla Hanson is on Crestor and she is working on decreasing cholesterol in her diet. She denies chest pain or myalgias.   3. Vitamin D deficiency Carla Hanson's last Vitamin D level was at goal. No side effects were noted.   Assessment/Plan:   1. Pre-diabetes We will check labs today, and we will refill metformin for 1 month. Carla Hanson will continue to work on weight loss, exercise, and decreasing simple carbohydrates to help decrease the risk of diabetes.   - CMP14+EGFR - TSH - Insulin, random - Hemoglobin A1c - metFORMIN (GLUCOPHAGE) 500 MG tablet; Take 1 tablet (500 mg total) by mouth 2 (two) times daily with a meal.  Dispense: 60 tablet; Refill: 0  2. Other hyperlipidemia Cardiovascular risk and specific lipid/LDL goals reviewed. We discussed several lifestyle modifications today. We will check labs today, and we will refill Crestor 20 mg daily for 1 month. Carla Hanson will continue to work on diet, exercise and weight loss efforts. Orders and follow up as documented in patient record.   - Lipid Panel With LDL/HDL  Ratio  3. Vitamin D deficiency We will check labs today. Carla Hanson will follow-up for routine testing of Vitamin D, at least 2-3 times per year to avoid over-replacement.  - VITAMIN D 25 Hydroxy (Vit-D Deficiency, Fractures)  4. Obesity, Current BMI 52.8 Carla Hanson is currently in the action stage of change. As such, her goal is to continue with weight loss efforts. She has agreed to the Category 3 Plan.   Exercise goals: As is.  Behavioral modification strategies: increasing lean protein intake.  Carla Hanson has agreed to follow-up with our clinic in 3 to 4 weeks. She was informed of the importance of frequent follow-up visits to maximize her success with intensive lifestyle modifications for her multiple health conditions.   Carla Hanson was informed we would discuss her lab results at her next visit unless there is a critical issue that needs to be addressed sooner. Carla Hanson agreed to keep her next visit at the agreed upon time to discuss these results.  Objective:   Blood pressure 121/66, pulse 72, temperature 97.9 F (36.6 C), height $RemoveBe'5\' 3"'[id number]$  (1.6 m), weight 298 lb (135.2 kg), SpO2 93 %. Body mass index is 52.79 kg/m.  General: Cooperative, alert, well developed, in no acute distress. HEENT: Conjunctivae and lids unremarkable. Cardiovascular: Regular rhythm.  Lungs: Normal work of breathing. Neurologic: No focal deficits.   Lab Results  Component Value Date   CREATININE 0.60 03/04/2022   BUN 15 03/04/2022   NA 141 03/04/2022   K 3.8 03/04/2022   CL 101  03/04/2022   CO2 26 03/04/2022   Lab Results  Component Value Date   ALT 19 03/04/2022   AST 16 03/04/2022   ALKPHOS 51 03/04/2022   BILITOT 0.3 03/04/2022   Lab Results  Component Value Date   HGBA1C 6.1 (H) 03/04/2022   HGBA1C 6.2 (H) 11/20/2021   HGBA1C 6.3 (H) 05/21/2021   HGBA1C 6.4 (A) 12/19/2020   HGBA1C 6.1 04/11/2020   Lab Results  Component Value Date   INSULIN 81.0 (H) 03/04/2022   INSULIN 60.8 (H) 11/20/2021    INSULIN 64.3 (H) 05/21/2021   INSULIN 77.3 (H) 01/03/2021   Lab Results  Component Value Date   TSH 1.460 03/04/2022   Lab Results  Component Value Date   CHOL 149 03/04/2022   HDL 46 03/04/2022   LDLCALC 86 03/04/2022   TRIG 92 03/04/2022   CHOLHDL 5.7 (H) 05/15/2021   Lab Results  Component Value Date   VD25OH 57.1 03/04/2022   VD25OH 72.8 11/20/2021   VD25OH 42.6 05/15/2021   Lab Results  Component Value Date   WBC 8.8 01/03/2021   HGB 13.2 01/03/2021   HCT 41.4 01/03/2021   MCV 79 01/03/2021   PLT 346 01/03/2021   Lab Results  Component Value Date   IRON 32 12/03/2016   TIBC 363 12/03/2016   FERRITIN 13 (L) 02/09/2018   Attestation Statements:   Reviewed by clinician on day of visit: allergies, medications, problem list, medical history, surgical history, family history, social history, and previous encounter notes.   I, Trixie Dredge, am acting as transcriptionist for Dennard Nip, MD.  I have reviewed the above documentation for accuracy and completeness, and I agree with the above. -  Dennard Nip, MD

## 2022-03-21 NOTE — Progress Notes (Signed)
TeleHealth Visit:  This visit was completed with telemedicine (audio/video) technology. Carla Hanson has verbally consented to this TeleHealth visit. The patient is located at home, the provider is located at home. The participants in this visit include the listed provider and patient. The visit was conducted today via MyChart video.  OBESITY Carla Hanson is here to discuss her progress with her obesity treatment plan along with follow-up of her obesity related diagnoses.   Today's visit was # 22 Starting weight: 324 lbs Starting date: 01/03/2021 Weight at last in office visit: 298 lbs on 03/04/22 Total weight loss: 26 lbs at last in office visit on 03/04/22. Today's reported weight: 298 lbs   Nutrition Plan: the Category 3 Plan- adheres 95 % of time.  Hunger is moderately controlled. Cravings are moderately controlled.  Current exercise:  yoga, weights, cardio 45 minutes 5 times week.  Interim History: Carla Hanson reports things are much more on an even keel now and she is doing well.  She does have some hunger after a long exercise session but she is able to satisfy this with extra water or a yogurt. She sometimes has only cereal for dinner because she does not want to eat something heavy (meat). She has occasional sweet tea, less than 1 time per week.  Assessment/Plan:  1. Prediabetes Dae has a diagnosis of prediabetes based on her elevated HgA1c. She denies polyphagia. Medication(s): Metformin 500 mg twice daily. Lab Results  Component Value Date   HGBA1C 6.1 (H) 03/04/2022   Lab Results  Component Value Date   INSULIN 81.0 (H) 03/04/2022   INSULIN 60.8 (H) 11/20/2021   INSULIN 64.3 (H) 05/21/2021   INSULIN 77.3 (H) 01/03/2021    Plan: Refill metformin twice daily.   2.  Intertrigo She has had sporadic irritation underneath her panniculus which has worsened as she loses weight.  The area becomes red, painful, and sometimes drains. She is wondering if Medicaid will cover  removal of her extra skin on her abdomen.  Plan: We will follow-up with PCP for this issue.  Obesity: Current BMI 52.8 Carla Hanson is currently in the action stage of change. As such, her goal is to continue with weight loss efforts.  She has agreed to the Category 3 Plan.   Exercise goals: Continue same.  Behavioral modification strategies: increasing lean protein intake and decreasing simple carbohydrates. Handouts via MyChart: Protein equivalents Consider swapping the foods at breakfast and lunch.  Carla Hanson has agreed to follow-up with our clinic in 3 weeks.   No orders of the defined types were placed in this encounter.   Medications Discontinued During This Encounter  Medication Reason   metFORMIN (GLUCOPHAGE) 500 MG tablet Reorder     Meds ordered this encounter  Medications   metFORMIN (GLUCOPHAGE) 500 MG tablet    Sig: Take 1 tablet (500 mg total) by mouth 2 (two) times daily with a meal.    Dispense:  60 tablet    Refill:  0    Order Specific Question:   Supervising Provider    Answer:   Dennard Nip D [[id number]]      Objective:   VITALS: Per patient if applicable, see vitals. GENERAL: Alert and in no acute distress. CARDIOPULMONARY: No increased WOB. Speaking in clear sentences.  PSYCH: Pleasant and cooperative. Speech normal rate and rhythm. Affect is appropriate. Insight and judgement are appropriate. Attention is focused, linear, and appropriate.  NEURO: Oriented as arrived to appointment on time with no prompting.   Lab Results  Component Value  Date   CREATININE 0.60 03/04/2022   BUN 15 03/04/2022   NA 141 03/04/2022   K 3.8 03/04/2022   CL 101 03/04/2022   CO2 26 03/04/2022   Lab Results  Component Value Date   ALT 19 03/04/2022   AST 16 03/04/2022   ALKPHOS 51 03/04/2022   BILITOT 0.3 03/04/2022   Lab Results  Component Value Date   HGBA1C 6.1 (H) 03/04/2022   HGBA1C 6.2 (H) 11/20/2021   HGBA1C 6.3 (H) 05/21/2021   HGBA1C 6.4 (A) 12/19/2020    HGBA1C 6.1 04/11/2020   Lab Results  Component Value Date   INSULIN 81.0 (H) 03/04/2022   INSULIN 60.8 (H) 11/20/2021   INSULIN 64.3 (H) 05/21/2021   INSULIN 77.3 (H) 01/03/2021   Lab Results  Component Value Date   TSH 1.460 03/04/2022   Lab Results  Component Value Date   CHOL 149 03/04/2022   HDL 46 03/04/2022   LDLCALC 86 03/04/2022   TRIG 92 03/04/2022   CHOLHDL 5.7 (H) 05/15/2021   Lab Results  Component Value Date   WBC 8.8 01/03/2021   HGB 13.2 01/03/2021   HCT 41.4 01/03/2021   MCV 79 01/03/2021   PLT 346 01/03/2021   Lab Results  Component Value Date   IRON 32 12/03/2016   TIBC 363 12/03/2016   FERRITIN 13 (L) 02/09/2018   Lab Results  Component Value Date   VD25OH 57.1 03/04/2022   VD25OH 72.8 11/20/2021   VD25OH 42.6 05/15/2021    Attestation Statements:   Reviewed by clinician on day of visit: allergies, medications, problem list, medical history, surgical history, family history, social history, and previous encounter notes.

## 2022-03-25 ENCOUNTER — Encounter (INDEPENDENT_AMBULATORY_CARE_PROVIDER_SITE_OTHER): Payer: Self-pay | Admitting: Family Medicine

## 2022-03-25 ENCOUNTER — Telehealth (INDEPENDENT_AMBULATORY_CARE_PROVIDER_SITE_OTHER): Payer: Medicaid Other | Admitting: Family Medicine

## 2022-03-25 DIAGNOSIS — E669 Obesity, unspecified: Secondary | ICD-10-CM | POA: Diagnosis not present

## 2022-03-25 DIAGNOSIS — Z6841 Body Mass Index (BMI) 40.0 and over, adult: Secondary | ICD-10-CM | POA: Diagnosis not present

## 2022-03-25 DIAGNOSIS — R7303 Prediabetes: Secondary | ICD-10-CM

## 2022-03-25 DIAGNOSIS — L304 Erythema intertrigo: Secondary | ICD-10-CM | POA: Diagnosis not present

## 2022-03-25 MED ORDER — METFORMIN HCL 500 MG PO TABS: 500.0000 mg | ORAL_TABLET | Freq: Two times a day (BID) | ORAL | 0 refills | Status: DC

## 2022-03-27 ENCOUNTER — Other Ambulatory Visit (INDEPENDENT_AMBULATORY_CARE_PROVIDER_SITE_OTHER): Payer: Self-pay | Admitting: Family Medicine

## 2022-03-27 DIAGNOSIS — R7303 Prediabetes: Secondary | ICD-10-CM

## 2022-03-28 ENCOUNTER — Ambulatory Visit: Payer: Medicaid Other

## 2022-04-05 ENCOUNTER — Telehealth (INDEPENDENT_AMBULATORY_CARE_PROVIDER_SITE_OTHER): Payer: Medicaid Other | Admitting: Family Medicine

## 2022-04-05 ENCOUNTER — Encounter: Payer: Self-pay | Admitting: Family Medicine

## 2022-04-05 DIAGNOSIS — Z6841 Body Mass Index (BMI) 40.0 and over, adult: Secondary | ICD-10-CM | POA: Diagnosis not present

## 2022-04-05 MED ORDER — ALBUTEROL SULFATE HFA 108 (90 BASE) MCG/ACT IN AERS: 2.0000 | INHALATION_SPRAY | Freq: Four times a day (QID) | RESPIRATORY_TRACT | 4 refills | Status: DC | PRN

## 2022-04-05 MED ORDER — MUPIROCIN 2 % EX OINT: 1.0000 "application " | TOPICAL_OINTMENT | Freq: Two times a day (BID) | CUTANEOUS | 0 refills | Status: AC

## 2022-04-05 MED ORDER — CETIRIZINE HCL 10 MG PO TABS: 10.0000 mg | ORAL_TABLET | Freq: Every day | ORAL | 1 refills | Status: DC

## 2022-04-05 NOTE — Assessment & Plan Note (Signed)
Referral placed to [facility]. Continue f/u with weight loss management center.

## 2022-04-05 NOTE — Progress Notes (Signed)
[facility] Visit  Patient consented to have virtual visit and was identified by name and date of birth. Method of visit: Video  Encounter participants: Patient: Carla Hanson - located at Home Provider: Andrena Mews - located at [facility] office Others (if applicable): NA  Chief Complaint: Skin concerns  HPI:  Skin irritation/Weight management: She c/o irritation of the skin of her belly and gluteus since she lost weight. She feels this is related to her excess skin after weight loss. Her symptoms started about a month ago and have been recurring/intermittent. About a month ago, she had rawness of her gluteal skin, which appears red and painful. She applied Vaseline with some improvement. Her most recent presentation was three days ago. Denies skin breakage/ulcer. She is otherwise compliant with her weight management plan and requested referral to [facility].  ROS: per HPI  Pertinent PMHx: PMHx reviewed  Exam:  There were no vitals taken for this visit.  Respiratory: No respiratory distress Unable to evaluated her skin  Assessment/Plan: Skin irritation: Likely due to weight loss with excess skin. Unfortunately, I am unable to evaluate her skin via video visit. However, I discussed the trial of bactroban ointment. She asked about skin removal surgery. I advised her to come in person for evaluation, and we can discuss referral at that time. She agreed with the plan.  Class 3 severe obesity with serious comorbidity and body mass index (BMI) of 50.0 to 59.9 in adult [facility]) Referral placed to [facility]. Continue f/u with weight loss management center.     Time spent during visit with patient: 15 minutes

## 2022-04-05 NOTE — Patient Instructions (Signed)
Mupirocin Cream or Ointment What is this medication? MUPIROCIN (myoo PEER oh sin) treats skin infections caused by bacteria. It works by killing or preventing the growth of bacteria on the skin. This medicine may be used for other purposes; ask your health care provider or pharmacist if you have questions. COMMON BRAND NAME(S): Bactroban, Centany, Centany AT What should I tell my care team before I take this medication? They need to know if you have any of these conditions: Kidney disease Large areas of burned or damaged skin Stomach or intestine problems such as colitis An unusual or allergic reaction to mupirocin, other medications, foods, dyes, or preservatives Pregnant or trying to get pregnant Breast-feeding How should I use this medication? This medication is for external use only. Do not take by mouth. Wash your hands before and after use. If you are treating a hand infection, only wash your hands before use. Do not get it in your eyes. If you do, rinse your eyes with plenty of cool tap water. Use it as directed on the prescription label at the same time every day. Do not use it more often than directed. Use the medication for the full course as directed by your care team, even if you think you are better. Do not stop using it unless your care team tells you to stop it early. Apply a thin film of the medication to the affected area. You can cover the area with a sterile gauze dressing (bandage). Do not use an airtight bandage (such as a plastic-covered bandage). Talk to your care team about the use of this medication in children. While it may be prescribed for children for selected conditions, precautions do apply. Overdosage: If you think you have taken too much of this medicine contact a poison control center or emergency room at once. NOTE: This medicine is only for you. Do not share this medicine with others. What if I miss a dose? If you miss a dose, use it as soon as you can. If it  is almost time for your next dose, use only that dose. Do not use double or extra doses. What may interact with this medication? Interactions are not expected. Do not use any other skin products on the affected area without telling your care team. This list may not describe all possible interactions. Give your health care provider a list of all the medicines, herbs, non-prescription drugs, or dietary supplements you use. Also tell them if you smoke, drink alcohol, or use illegal drugs. Some items may interact with your medicine. What should I watch for while using this medication? Tell your care team if your symptoms do not start to get better or if they get worse. Do not treat diarrhea with over the counter products. Contact your care team if you have diarrhea that lasts more than 2 days or if it is severe and watery. What side effects may I notice from receiving this medication? Side effects that you should report to your care team as soon as possible: Allergic reactions--skin rash, itching, hives, swelling of the face, lips, tongue, or throat Burning, itching, crusting, or peeling of treated skin Severe diarrhea, fever Side effects that usually do not require medical attention (report to your care team if they continue or are bothersome): Mild skin irritation, redness, or dryness This list may not describe all possible side effects. Call your doctor for medical advice about side effects. You may report side effects to FDA at 1-800-FDA-1088. Where should I keep my  medication? Keep out of the reach of children and pets. Store at room temperature between 20 and 25 degrees C (68 and 77 degrees F). Do not freeze. Get rid of any unused medication after the expiration date. NOTE: This sheet is a summary. It may not cover all possible information. If you have questions about this medicine, talk to your doctor, pharmacist, or health care provider.  2023 Elsevier/Gold Standard (2021-02-25 00:00:00)

## 2022-04-09 ENCOUNTER — Encounter: Payer: Self-pay | Admitting: *Deleted

## 2022-04-15 ENCOUNTER — Encounter (INDEPENDENT_AMBULATORY_CARE_PROVIDER_SITE_OTHER): Payer: Self-pay | Admitting: Family Medicine

## 2022-04-15 ENCOUNTER — Ambulatory Visit (INDEPENDENT_AMBULATORY_CARE_PROVIDER_SITE_OTHER): Payer: Medicaid Other | Admitting: Family Medicine

## 2022-04-15 VITALS — BP 134/64 | HR 69 | Temp 98.0°F | Ht 63.0 in | Wt 297.0 lb

## 2022-04-15 DIAGNOSIS — Z7984 Long term (current) use of oral hypoglycemic drugs: Secondary | ICD-10-CM | POA: Diagnosis not present

## 2022-04-15 DIAGNOSIS — I10 Essential (primary) hypertension: Secondary | ICD-10-CM | POA: Diagnosis not present

## 2022-04-15 DIAGNOSIS — E559 Vitamin D deficiency, unspecified: Secondary | ICD-10-CM | POA: Diagnosis not present

## 2022-04-15 DIAGNOSIS — Z6841 Body Mass Index (BMI) 40.0 and over, adult: Secondary | ICD-10-CM

## 2022-04-15 DIAGNOSIS — E669 Obesity, unspecified: Secondary | ICD-10-CM

## 2022-04-15 DIAGNOSIS — E7849 Other hyperlipidemia: Secondary | ICD-10-CM

## 2022-04-15 DIAGNOSIS — R7303 Prediabetes: Secondary | ICD-10-CM

## 2022-04-16 NOTE — Progress Notes (Unsigned)
Chief Complaint:   OBESITY Carla Hanson is here to discuss her progress with her obesity treatment plan along with follow-up of her obesity related diagnoses. Carla Hanson is on the Category 3 Plan and states she is following her eating plan approximately 90% of the time. Carla Hanson states she is doing workouts and weights for 50 minutes 5 times per week.  Today's visit was #: 23 Starting weight: 324 lbs Starting date: 01/03/2021 Today's weight: 297 lbs Today's date: 04/15/2022 Total lbs lost to date: 27 Total lbs lost since last in-office visit: 1  Interim History: Carla Hanson continues to lose weight.  She is working hard to eat all of her food on her plan.  She is joining [employer] soon.  Her hunger is controlled.  Subjective:   1. Other hyperlipidemia Carla Hanson has been stable on Crestor for approximately 3 months.  No myalgias were noted.  Her LDL, triglycerides, and HDL are all improved with medications and weight loss.  I discussed labs with the patient today.  2. Essential hypertension Carla Hanson's blood pressure is controlled today.  She is stable on her medications, and she has no signs of hypertension.  I discussed labs with the patient today.  3. Pre-diabetes Carla Hanson's A1c is improving steadily, and she has no problems with metformin.  She is doing well with her diet.  I discussed labs with the patient today.  4. Vitamin D deficiency Carla Hanson is stable on vitamin D, and her recent level is at goal.  I discussed labs with the patient today.  Assessment/Plan:   1. Other hyperlipidemia Cardiovascular risk and specific lipid/LDL goals reviewed.  We discussed several lifestyle modifications today. Carla Hanson will continue her diet, Crestor, and exercise. Orders and follow up as documented in patient record.   2. Essential hypertension Carla Hanson will continue her diet and exercise to improve blood pressure control. We will watch for signs of hypotension as she continues her lifestyle modifications.  3.  Pre-diabetes Carla Hanson will continue metformin and her diet, and we will continue to manage her medications.  4. Vitamin D deficiency Carla Hanson Friends will continue Vitamin D and she will follow-up for routine testing of Vitamin D, at least 2-3 times per year to avoid over-replacement.  5. Obesity, Current BMI 52.7 Carla Hanson is currently in the action stage of change. As such, her goal is to continue with weight loss efforts. She has agreed to the Category 3 Plan.   Exercise goals: As is.  Behavioral modification strategies: increasing lean protein intake.  Carla Hanson has agreed to follow-up with our clinic in 2 to 3 weeks. She was informed of the importance of frequent follow-up visits to maximize her success with intensive lifestyle modifications for her multiple health conditions.   Objective:   Blood pressure 134/64, pulse 69, temperature 98 F (36.7 C), height '5\' 3"'$  (1.6 m), weight 297 lb (134.7 kg), SpO2 96 %. Body mass index is 52.61 kg/m.  General: Cooperative, alert, well developed, in no acute distress. HEENT: Conjunctivae and lids unremarkable. Cardiovascular: Regular rhythm.  Lungs: Normal work of breathing. Neurologic: No focal deficits.   Lab Results  Component Value Date   CREATININE 0.60 03/04/2022   BUN 15 03/04/2022   NA 141 03/04/2022   K 3.8 03/04/2022   CL 101 03/04/2022   CO2 26 03/04/2022   Lab Results  Component Value Date   ALT 19 03/04/2022   AST 16 03/04/2022   ALKPHOS 51 03/04/2022   BILITOT 0.3 03/04/2022   Lab Results  Component Value Date  HGBA1C 6.1 (H) 03/04/2022   HGBA1C 6.2 (H) 11/20/2021   HGBA1C 6.3 (H) 05/21/2021   HGBA1C 6.4 (A) 12/19/2020   HGBA1C 6.1 04/11/2020   Lab Results  Component Value Date   INSULIN 81.0 (H) 03/04/2022   INSULIN 60.8 (H) 11/20/2021   INSULIN 64.3 (H) 05/21/2021   INSULIN 77.3 (H) 01/03/2021   Lab Results  Component Value Date   TSH 1.460 03/04/2022   Lab Results  Component Value Date   CHOL 149 03/04/2022    HDL 46 03/04/2022   LDLCALC 86 03/04/2022   TRIG 92 03/04/2022   CHOLHDL 5.7 (H) 05/15/2021   Lab Results  Component Value Date   VD25OH 57.1 03/04/2022   VD25OH 72.8 11/20/2021   VD25OH 42.6 05/15/2021   Lab Results  Component Value Date   WBC 8.8 01/03/2021   HGB 13.2 01/03/2021   HCT 41.4 01/03/2021   MCV 79 01/03/2021   PLT 346 01/03/2021   Lab Results  Component Value Date   IRON 32 12/03/2016   TIBC 363 12/03/2016   FERRITIN 13 (L) 02/09/2018   Attestation Statements:   Reviewed by clinician on day of visit: allergies, medications, problem list, medical history, surgical history, family history, social history, and previous encounter notes.   I, Trixie Dredge, am acting as transcriptionist for Dennard Nip, MD.  I have reviewed the above documentation for accuracy and completeness, and I agree with the above. -  Dennard Nip, MD

## 2022-04-24 ENCOUNTER — Other Ambulatory Visit (INDEPENDENT_AMBULATORY_CARE_PROVIDER_SITE_OTHER): Payer: Self-pay | Admitting: Family Medicine

## 2022-04-24 DIAGNOSIS — R7303 Prediabetes: Secondary | ICD-10-CM

## 2022-05-01 NOTE — Progress Notes (Signed)
TeleHealth Visit:  This visit was completed with telemedicine (audio/video) technology. Carla Hanson has verbally consented to this TeleHealth visit. The patient is located at home, the provider is located at home. The participants in this visit include the listed provider and patient. The visit was conducted today via MyChart video.  OBESITY Carla Hanson is here to discuss her progress with her obesity treatment plan along with follow-up of her obesity related diagnoses.   Today's visit was # 24 Starting weight: 324 lbs Starting date: 01/03/2021 Weight at last in office visit: 297 lbs on 04/15/22 Total weight loss: 27 lbs at last in office visit on 04/15/22. Today's reported weight: 297 lbs   Nutrition Plan: the Category 3 Plan. - 95% adherence Hunger is well controlled. Cravings are well controlled.  Current exercise: joined [employer]. Met with personal trainer and has workout schedule. Goes 3 days per week - does 10 min cardio and weight machines for 1 hour. Considering having sessions with personal trainer.   Interim History: Carla Hanson is really enjoying attending [facility] and all of its amenities.  She is doing an excellent job with adherence to category 3.  She reports some days her appetite is not the best so she divides the food up and eats throughout the day.  She drinks plenty of water. She is cooking more recently.  Food recall: Breakfast-special K protein cereal or cheese omelette with vegetables Lunch-sandwich or salad with 3.5 to 4 ounces of meat Dinner- had chicken with vegetables yesterday.  Assessment/Plan:  1. Prediabetes Carla Hanson's A1c was nearly at diabetes level in February 2022 (6.4).  Currently 6.1. She denies polyphagia. Medication(s): Metformin 500 mg twice daily. Lab Results  Component Value Date   HGBA1C 6.1 (H) 03/04/2022   Lab Results  Component Value Date   INSULIN 81.0 (H) 03/04/2022   INSULIN 60.8 (H) 11/20/2021   INSULIN 64.3 (H) 05/21/2021   INSULIN 77.3  (H) 01/03/2021    Plan: Refill metformin 500 mg twice daily.  2.  Peripheral edema Well-controlled.  On hydrochlorothiazide 25 mg daily.  Plan: Refill hydrochlorothiazide 25 mg daily.  3. Obesity: Current BMI 52.6 Carla Hanson is currently in the action stage of change. As such, her goal is to continue with weight loss efforts.  She has agreed to the Category 3 Plan.   Exercise goals: as is  Behavioral modification strategies: increasing lean protein intake, decreasing simple carbohydrates, and planning for success.  Carla Hanson has agreed to follow-up with our clinic in 2 weeks.   No orders of the defined types were placed in this encounter.   Medications Discontinued During This Encounter  Medication Reason   hydrochlorothiazide (HYDRODIURIL) 25 MG tablet Reorder   metFORMIN (GLUCOPHAGE) 500 MG tablet Reorder     Meds ordered this encounter  Medications   metFORMIN (GLUCOPHAGE) 500 MG tablet    Sig: Take 1 tablet (500 mg total) by mouth 2 (two) times daily with a meal.    Dispense:  60 tablet    Refill:  0    Order Specific Question:   Supervising Provider    Answer:   Georgia Lopes [[id number]]   hydrochlorothiazide (HYDRODIURIL) 25 MG tablet    Sig: Take 1 tablet (25 mg total) by mouth daily.    Dispense:  90 tablet    Refill:  0    Order Specific Question:   Supervising Provider    Answer:   Georgia Lopes [[id number]]      Objective:   VITALS: Per patient if applicable, see  vitals. GENERAL: Alert and in no acute distress. CARDIOPULMONARY: No increased WOB. Speaking in clear sentences.  PSYCH: Pleasant and cooperative. Speech normal rate and rhythm. Affect is appropriate. Insight and judgement are appropriate. Attention is focused, linear, and appropriate.  NEURO: Oriented as arrived to appointment on time with no prompting.   Lab Results  Component Value Date   CREATININE 0.60 03/04/2022   BUN 15 03/04/2022   NA 141 03/04/2022   K 3.8 03/04/2022   CL 101 03/04/2022    CO2 26 03/04/2022   Lab Results  Component Value Date   ALT 19 03/04/2022   AST 16 03/04/2022   ALKPHOS 51 03/04/2022   BILITOT 0.3 03/04/2022   Lab Results  Component Value Date   HGBA1C 6.1 (H) 03/04/2022   HGBA1C 6.2 (H) 11/20/2021   HGBA1C 6.3 (H) 05/21/2021   HGBA1C 6.4 (A) 12/19/2020   HGBA1C 6.1 04/11/2020   Lab Results  Component Value Date   INSULIN 81.0 (H) 03/04/2022   INSULIN 60.8 (H) 11/20/2021   INSULIN 64.3 (H) 05/21/2021   INSULIN 77.3 (H) 01/03/2021   Lab Results  Component Value Date   TSH 1.460 03/04/2022   Lab Results  Component Value Date   CHOL 149 03/04/2022   HDL 46 03/04/2022   LDLCALC 86 03/04/2022   TRIG 92 03/04/2022   CHOLHDL 5.7 (H) 05/15/2021   Lab Results  Component Value Date   WBC 8.8 01/03/2021   HGB 13.2 01/03/2021   HCT 41.4 01/03/2021   MCV 79 01/03/2021   PLT 346 01/03/2021   Lab Results  Component Value Date   IRON 32 12/03/2016   TIBC 363 12/03/2016   FERRITIN 13 (L) 02/09/2018   Lab Results  Component Value Date   VD25OH 57.1 03/04/2022   VD25OH 72.8 11/20/2021   VD25OH 42.6 05/15/2021    Attestation Statements:   Reviewed by clinician on day of visit: allergies, medications, problem list, medical history, surgical history, family history, social history, and previous encounter notes.

## 2022-05-05 ENCOUNTER — Other Ambulatory Visit (INDEPENDENT_AMBULATORY_CARE_PROVIDER_SITE_OTHER): Payer: Self-pay | Admitting: Family Medicine

## 2022-05-05 DIAGNOSIS — R7303 Prediabetes: Secondary | ICD-10-CM

## 2022-05-05 DIAGNOSIS — R609 Edema, unspecified: Secondary | ICD-10-CM

## 2022-05-06 ENCOUNTER — Telehealth (INDEPENDENT_AMBULATORY_CARE_PROVIDER_SITE_OTHER): Payer: Medicaid Other | Admitting: Family Medicine

## 2022-05-06 ENCOUNTER — Encounter (INDEPENDENT_AMBULATORY_CARE_PROVIDER_SITE_OTHER): Payer: Self-pay | Admitting: Family Medicine

## 2022-05-06 ENCOUNTER — Other Ambulatory Visit (INDEPENDENT_AMBULATORY_CARE_PROVIDER_SITE_OTHER): Payer: Self-pay | Admitting: Family Medicine

## 2022-05-06 DIAGNOSIS — Z7984 Long term (current) use of oral hypoglycemic drugs: Secondary | ICD-10-CM

## 2022-05-06 DIAGNOSIS — R609 Edema, unspecified: Secondary | ICD-10-CM

## 2022-05-06 DIAGNOSIS — R7303 Prediabetes: Secondary | ICD-10-CM | POA: Diagnosis not present

## 2022-05-06 DIAGNOSIS — E669 Obesity, unspecified: Secondary | ICD-10-CM

## 2022-05-06 DIAGNOSIS — Z6841 Body Mass Index (BMI) 40.0 and over, adult: Secondary | ICD-10-CM

## 2022-05-06 DIAGNOSIS — R600 Localized edema: Secondary | ICD-10-CM

## 2022-05-06 MED ORDER — HYDROCHLOROTHIAZIDE 25 MG PO TABS: 25.0000 mg | ORAL_TABLET | Freq: Every day | ORAL | 0 refills | Status: DC

## 2022-05-06 MED ORDER — METFORMIN HCL 500 MG PO TABS: 500.0000 mg | ORAL_TABLET | Freq: Two times a day (BID) | ORAL | 0 refills | Status: DC

## 2022-05-07 ENCOUNTER — Other Ambulatory Visit (INDEPENDENT_AMBULATORY_CARE_PROVIDER_SITE_OTHER): Payer: Self-pay | Admitting: Family Medicine

## 2022-05-07 DIAGNOSIS — R609 Edema, unspecified: Secondary | ICD-10-CM

## 2022-05-13 ENCOUNTER — Encounter: Payer: Self-pay | Admitting: Family Medicine

## 2022-05-14 ENCOUNTER — Ambulatory Visit: Payer: Medicaid Other | Admitting: Family Medicine

## 2022-05-15 ENCOUNTER — Other Ambulatory Visit (INDEPENDENT_AMBULATORY_CARE_PROVIDER_SITE_OTHER): Payer: Self-pay | Admitting: Family Medicine

## 2022-05-15 ENCOUNTER — Encounter (INDEPENDENT_AMBULATORY_CARE_PROVIDER_SITE_OTHER): Payer: Self-pay | Admitting: Family Medicine

## 2022-05-15 ENCOUNTER — Ambulatory Visit (INDEPENDENT_AMBULATORY_CARE_PROVIDER_SITE_OTHER): Payer: Medicaid Other | Admitting: Family Medicine

## 2022-05-15 VITALS — BP 150/71 | HR 66 | Temp 98.2°F | Ht 63.0 in | Wt 298.0 lb

## 2022-05-15 DIAGNOSIS — E7849 Other hyperlipidemia: Secondary | ICD-10-CM | POA: Diagnosis not present

## 2022-05-15 DIAGNOSIS — I10 Essential (primary) hypertension: Secondary | ICD-10-CM

## 2022-05-15 DIAGNOSIS — Z6841 Body Mass Index (BMI) 40.0 and over, adult: Secondary | ICD-10-CM

## 2022-05-15 DIAGNOSIS — E669 Obesity, unspecified: Secondary | ICD-10-CM | POA: Diagnosis not present

## 2022-05-15 MED ORDER — ROSUVASTATIN CALCIUM 20 MG PO TABS: 20.0000 mg | ORAL_TABLET | Freq: Every day | ORAL | 0 refills | Status: DC

## 2022-05-20 NOTE — Progress Notes (Addendum)
Chief Complaint:   OBESITY Carla Hanson is here to discuss her progress with her obesity treatment plan along with follow-up of her obesity related diagnoses. Carla Hanson is on the Category 3 Plan and states she is following her eating plan approximately 90% of the time. Carla Hanson states she is doing cardio and walking for  60 minutes 5 times per week.  Today's visit was #: 25 Starting weight: 324 lbs Starting date: 01/03/2021 Today's weight: 298 lbs Today's date: 05/15/2022 Total lbs lost to date: 26 Total lbs lost since last in-office visit: 0  Interim History: Sister recently started going to the [employer] 3 times per week, and she is walking 2 days/week.  She is drinking almost 100 ounces of water per day.  Her hunger is mostly controlled.  Subjective:   1. Essential hypertension Carla Hanson's blood pressure at home is normally 237-628 systolic, but is elevated in our office today.  Her blood pressure is normally controlled.  She is eating a lot of sodium.  2. Other hyperlipidemia Carla Hanson is stable on Crestor, and she is decreasing cholesterol in her diet.  No side effects were noted.  Assessment/Plan:   1. Essential hypertension Carla Hanson will continue with her diet and exercise, and we will recheck her blood pressure in 2 to 3 weeks.  2. Other hyperlipidemia We will refill Crestor 20 mg daily for 1 month. Carla Hanson will continue to work on diet, exercise and weight loss efforts. Orders and follow up as documented in patient record.   - rosuvastatin (CRESTOR) 20 MG tablet; Take 1 tablet (20 mg total) by mouth daily.  Dispense: 30 tablet; Refill: 0  3. Obesity, Current BMI 52.8 Carla Hanson is currently in the action stage of change. As such, her goal is to continue with weight loss efforts. She has agreed to the Category 3 Plan.   Exercise goals: As is.   Behavioral modification strategies: increasing lean protein intake.  Carla Hanson has agreed to follow-up with our clinic in 2 to 3 weeks. She was  informed of the importance of frequent follow-up visits to maximize her success with intensive lifestyle modifications for her multiple health conditions.   Objective:   Blood pressure (!) 150/71, pulse 66, temperature 98.2 F (36.8 C), height '5\' 3"'$  (1.6 m), weight 298 lb (135.2 kg), SpO2 98 %. Body mass index is 52.79 kg/m.  General: Cooperative, alert, well developed, in no acute distress. HEENT: Conjunctivae and lids unremarkable. Cardiovascular: Regular rhythm.  Lungs: Normal work of breathing. Neurologic: No focal deficits.   Lab Results  Component Value Date   CREATININE 0.60 03/04/2022   BUN 15 03/04/2022   NA 141 03/04/2022   K 3.8 03/04/2022   CL 101 03/04/2022   CO2 26 03/04/2022   Lab Results  Component Value Date   ALT 19 03/04/2022   AST 16 03/04/2022   ALKPHOS 51 03/04/2022   BILITOT 0.3 03/04/2022   Lab Results  Component Value Date   HGBA1C 6.1 (H) 03/04/2022   HGBA1C 6.2 (H) 11/20/2021   HGBA1C 6.3 (H) 05/21/2021   HGBA1C 6.4 (A) 12/19/2020   HGBA1C 6.1 04/11/2020   Lab Results  Component Value Date   INSULIN 81.0 (H) 03/04/2022   INSULIN 60.8 (H) 11/20/2021   INSULIN 64.3 (H) 05/21/2021   INSULIN 77.3 (H) 01/03/2021   Lab Results  Component Value Date   TSH 1.460 03/04/2022   Lab Results  Component Value Date   CHOL 149 03/04/2022   HDL 46 03/04/2022  LDLCALC 86 03/04/2022   TRIG 92 03/04/2022   CHOLHDL 5.7 (H) 05/15/2021   Lab Results  Component Value Date   VD25OH 57.1 03/04/2022   VD25OH 72.8 11/20/2021   VD25OH 42.6 05/15/2021   Lab Results  Component Value Date   WBC 8.8 01/03/2021   HGB 13.2 01/03/2021   HCT 41.4 01/03/2021   MCV 79 01/03/2021   PLT 346 01/03/2021   Lab Results  Component Value Date   IRON 32 12/03/2016   TIBC 363 12/03/2016   FERRITIN 13 (L) 02/09/2018   Attestation Statements:   Reviewed by clinician on day of visit: allergies, medications, problem list, medical history, surgical history,  family history, social history, and previous encounter notes.  Time spent on visit including pre-visit chart review and post-visit care and charting was 40 minutes.  I, Trixie Dredge, am acting as transcriptionist for Dennard Nip, MD.  I have reviewed the above documentation for accuracy and completeness, and I agree with the above. -  Dennard Nip, MD

## 2022-05-29 ENCOUNTER — Telehealth (INDEPENDENT_AMBULATORY_CARE_PROVIDER_SITE_OTHER): Payer: Medicaid Other | Admitting: Family Medicine

## 2022-05-29 ENCOUNTER — Encounter (INDEPENDENT_AMBULATORY_CARE_PROVIDER_SITE_OTHER): Payer: Self-pay | Admitting: Family Medicine

## 2022-05-29 ENCOUNTER — Other Ambulatory Visit (INDEPENDENT_AMBULATORY_CARE_PROVIDER_SITE_OTHER): Payer: Self-pay | Admitting: Family Medicine

## 2022-05-29 ENCOUNTER — Ambulatory Visit (INDEPENDENT_AMBULATORY_CARE_PROVIDER_SITE_OTHER): Payer: Medicaid Other | Admitting: Family Medicine

## 2022-05-29 VITALS — BP 161/87 | HR 106 | Temp 98.0°F | Ht 63.0 in | Wt 300.0 lb

## 2022-05-29 DIAGNOSIS — E669 Obesity, unspecified: Secondary | ICD-10-CM

## 2022-05-29 DIAGNOSIS — R7303 Prediabetes: Secondary | ICD-10-CM

## 2022-05-29 DIAGNOSIS — I10 Essential (primary) hypertension: Secondary | ICD-10-CM

## 2022-05-29 DIAGNOSIS — Z6841 Body Mass Index (BMI) 40.0 and over, adult: Secondary | ICD-10-CM | POA: Diagnosis not present

## 2022-05-29 MED ORDER — METFORMIN HCL 500 MG PO TABS: 500.0000 mg | ORAL_TABLET | Freq: Two times a day (BID) | ORAL | 0 refills | Status: DC

## 2022-05-30 ENCOUNTER — Other Ambulatory Visit: Payer: Self-pay | Admitting: Family Medicine

## 2022-05-30 ENCOUNTER — Telehealth (INDEPENDENT_AMBULATORY_CARE_PROVIDER_SITE_OTHER): Payer: Self-pay | Admitting: Family Medicine

## 2022-05-30 DIAGNOSIS — I10 Essential (primary) hypertension: Secondary | ICD-10-CM

## 2022-05-30 NOTE — Telephone Encounter (Signed)
Pt needs to know on the low carb diet if she can eat cereal and drink milk

## 2022-06-03 NOTE — Telephone Encounter (Signed)
No to both

## 2022-06-04 NOTE — Progress Notes (Unsigned)
Chief Complaint:   OBESITY Carla Hanson is here to discuss her progress with her obesity treatment plan along with follow-up of her obesity related diagnoses. Carla Hanson is on the Category 3 Plan and states she is following her eating plan approximately 90% of the time. Carla Hanson states she is doing cardio and weights for 60 minutes 5 times per week.  Today's visit was #: 26 Starting weight: 324 lbs Starting date: 01/03/2021 Today's weight: 300 lbs Today's date: 05/29/2022 Total lbs lost to date: 24 Total lbs lost since last in-office visit: 0  Interim History: Carla Hanson has been struggling more with weight loss, and she is feeling a bit discouraged. She is open to looking at other eating plan options.   Subjective:   1. Essential hypertension Carla Hanson's blood pressure is elevated today. She has changed her blood pressure medications to the afternoon.   2. Pre-diabetes Carla Hanson is taking metformin twice daily. She is working on her diet and weight loss, but she has struggled more recently.   Assessment/Plan:   1. Essential hypertension Carla Hanson is to check her blood pressure at home and change hydrochlorothiazide to AM and losartan to PM. We will recheck her blood pressure in 2 weeks.   2. Pre-diabetes Carla Hanson will continue metformin 500 mg BID, and we will refill for 1 month. She will change to the lower carbohydrate plan.   - metFORMIN (GLUCOPHAGE) 500 MG tablet; Take 1 tablet (500 mg total) by mouth 2 (two) times daily with a meal.  Dispense: 60 tablet; Refill: 0  3. Obesity, Current BMI 53.2 Carla Hanson is currently in the action stage of change. As such, her goal is to continue with weight loss efforts. She has agreed to change to following a lower carbohydrate, vegetable and lean protein rich diet plan.   Exercise goals: As is.   Behavioral modification strategies: increasing lean protein intake, increasing water intake, and meal planning and cooking strategies.  Carla Hanson has agreed to follow-up  with our clinic in 2 weeks. She was informed of the importance of frequent follow-up visits to maximize her success with intensive lifestyle modifications for her multiple health conditions.   Objective:   Blood pressure (!) 161/87, pulse (!) 106, temperature 98 F (36.7 C), height '5\' 3"'$  (1.6 m), weight 300 lb (136.1 kg), SpO2 97 %. Body mass index is 53.14 kg/m.  General: Cooperative, alert, well developed, in no acute distress. HEENT: Conjunctivae and lids unremarkable. Cardiovascular: Regular rhythm.  Lungs: Normal work of breathing. Neurologic: No focal deficits.   Lab Results  Component Value Date   CREATININE 0.60 03/04/2022   BUN 15 03/04/2022   NA 141 03/04/2022   K 3.8 03/04/2022   CL 101 03/04/2022   CO2 26 03/04/2022   Lab Results  Component Value Date   ALT 19 03/04/2022   AST 16 03/04/2022   ALKPHOS 51 03/04/2022   BILITOT 0.3 03/04/2022   Lab Results  Component Value Date   HGBA1C 6.1 (H) 03/04/2022   HGBA1C 6.2 (H) 11/20/2021   HGBA1C 6.3 (H) 05/21/2021   HGBA1C 6.4 (A) 12/19/2020   HGBA1C 6.1 04/11/2020   Lab Results  Component Value Date   INSULIN 81.0 (H) 03/04/2022   INSULIN 60.8 (H) 11/20/2021   INSULIN 64.3 (H) 05/21/2021   INSULIN 77.3 (H) 01/03/2021   Lab Results  Component Value Date   TSH 1.460 03/04/2022   Lab Results  Component Value Date   CHOL 149 03/04/2022   HDL 46 03/04/2022   [place]  86 03/04/2022   TRIG 92 03/04/2022   CHOLHDL 5.7 (H) 05/15/2021   Lab Results  Component Value Date   VD25OH 57.1 03/04/2022   VD25OH 72.8 11/20/2021   VD25OH 42.6 05/15/2021   Lab Results  Component Value Date   WBC 8.8 01/03/2021   HGB 13.2 01/03/2021   HCT 41.4 01/03/2021   MCV 79 01/03/2021   PLT 346 01/03/2021   Lab Results  Component Value Date   IRON 32 12/03/2016   TIBC 363 12/03/2016   FERRITIN 13 (L) 02/09/2018   Attestation Statements:   Reviewed by clinician on day of visit: allergies, medications, problem list,  medical history, surgical history, family history, social history, and previous encounter notes.   I, Trixie Dredge, am acting as transcriptionist for Dennard Nip, MD.  I have reviewed the above documentation for accuracy and completeness, and I agree with the above. -  Dennard Nip, MD

## 2022-06-12 ENCOUNTER — Ambulatory Visit (INDEPENDENT_AMBULATORY_CARE_PROVIDER_SITE_OTHER): Payer: Medicaid Other | Admitting: Family Medicine

## 2022-06-12 ENCOUNTER — Encounter (INDEPENDENT_AMBULATORY_CARE_PROVIDER_SITE_OTHER): Payer: Self-pay | Admitting: Family Medicine

## 2022-06-12 ENCOUNTER — Encounter (INDEPENDENT_AMBULATORY_CARE_PROVIDER_SITE_OTHER): Payer: Self-pay

## 2022-06-12 VITALS — BP 143/86 | HR 67 | Temp 98.3°F | Ht 63.0 in | Wt 297.0 lb

## 2022-06-12 DIAGNOSIS — Z6841 Body Mass Index (BMI) 40.0 and over, adult: Secondary | ICD-10-CM

## 2022-06-12 DIAGNOSIS — E669 Obesity, unspecified: Secondary | ICD-10-CM

## 2022-06-12 DIAGNOSIS — R7303 Prediabetes: Secondary | ICD-10-CM | POA: Diagnosis not present

## 2022-06-12 DIAGNOSIS — I10 Essential (primary) hypertension: Secondary | ICD-10-CM | POA: Diagnosis not present

## 2022-06-12 MED ORDER — METFORMIN HCL 500 MG PO TABS: 500.0000 mg | ORAL_TABLET | Freq: Two times a day (BID) | ORAL | 1 refills | Status: DC

## 2022-06-17 ENCOUNTER — Telehealth (INDEPENDENT_AMBULATORY_CARE_PROVIDER_SITE_OTHER): Payer: Medicaid Other | Admitting: Family Medicine

## 2022-06-19 NOTE — Progress Notes (Signed)
Chief Complaint:   OBESITY Carla Hanson is here to discuss her progress with her obesity treatment plan along with follow-up of her obesity related diagnoses. Carla Hanson is on following a lower carbohydrate, vegetable and lean protein rich diet plan and states she is following her eating plan approximately 98% of the time. Carla Hanson states she is doing cardio and weights for 60 minutes 3 times per week.  Today's visit was #: 43 Starting weight: 324 lbs Starting date: 01/03/2021 Today's weight: 297 lbs Today's date: 06/12/2022 Total lbs lost to date: 27 Total lbs lost since last in-office visit: 3  Interim History: Carla Hanson changed to the low carbohydrate plan, which she initially struggled to follow but she feels she is doing well and feels her energy has improved.  Subjective:   1. Essential hypertension Carla Hanson's blood pressure is elevated today, and she feels this is related to being in the office. She occasionally checks this at home and it tends to range between 716-967'E systolic.   2. Pre-diabetes Carla Hanson's is stable on metformin, and she denies nausea, vomiting, or muscle weakness.  Assessment/Plan:   1. Essential hypertension Carla Hanson will continue with diet and weight loss, and her medications. She will check her blood pressure 2-3 times per week.   2. Pre-diabetes We will refill metformin for 2 months. Carla Hanson will continue to work on weight loss, exercise, and decreasing simple carbohydrates to help decrease the risk of diabetes.   - metFORMIN (GLUCOPHAGE) 500 MG tablet; Take 1 tablet (500 mg total) by mouth 2 (two) times daily with a meal.  Dispense: 60 tablet; Refill: 1  3. Obesity, Current BMI 52.6 Carla Hanson is currently in the action stage of change. As such, her goal is to continue with weight loss efforts. She has agreed to following a lower carbohydrate, vegetable and lean protein rich diet plan.   We will recheck labs at her next visit.   Exercise goals: As is.   Behavioral  modification strategies: increasing lean protein intake.  Carla Hanson has agreed to follow-up with our clinic in 3 weeks. She was informed of the importance of frequent follow-up visits to maximize her success with intensive lifestyle modifications for her multiple health conditions.   Objective:   Blood pressure (!) 143/86, pulse 67, temperature 98.3 F (36.8 C), height '5\' 3"'$  (1.6 m), weight 297 lb (134.7 kg), SpO2 99 %. Body mass index is 52.61 kg/m.  General: Cooperative, alert, well developed, in no acute distress. HEENT: Conjunctivae and lids unremarkable. Cardiovascular: Regular rhythm.  Lungs: Normal work of breathing. Neurologic: No focal deficits.   Lab Results  Component Value Date   CREATININE 0.60 03/04/2022   BUN 15 03/04/2022   NA 141 03/04/2022   K 3.8 03/04/2022   CL 101 03/04/2022   CO2 26 03/04/2022   Lab Results  Component Value Date   ALT 19 03/04/2022   AST 16 03/04/2022   ALKPHOS 51 03/04/2022   BILITOT 0.3 03/04/2022   Lab Results  Component Value Date   HGBA1C 6.1 (H) 03/04/2022   HGBA1C 6.2 (H) 11/20/2021   HGBA1C 6.3 (H) 05/21/2021   HGBA1C 6.4 (A) 12/19/2020   HGBA1C 6.1 04/11/2020   Lab Results  Component Value Date   INSULIN 81.0 (H) 03/04/2022   INSULIN 60.8 (H) 11/20/2021   INSULIN 64.3 (H) 05/21/2021   INSULIN 77.3 (H) 01/03/2021   Lab Results  Component Value Date   TSH 1.460 03/04/2022   Lab Results  Component Value Date   CHOL  149 03/04/2022   HDL 46 03/04/2022   LDLCALC 86 03/04/2022   TRIG 92 03/04/2022   CHOLHDL 5.7 (H) 05/15/2021   Lab Results  Component Value Date   VD25OH 57.1 03/04/2022   VD25OH 72.8 11/20/2021   VD25OH 42.6 05/15/2021   Lab Results  Component Value Date   WBC 8.8 01/03/2021   HGB 13.2 01/03/2021   HCT 41.4 01/03/2021   MCV 79 01/03/2021   PLT 346 01/03/2021   Lab Results  Component Value Date   IRON 32 12/03/2016   TIBC 363 12/03/2016   FERRITIN 13 (L) 02/09/2018   Attestation  Statements:   Reviewed by clinician on day of visit: allergies, medications, problem list, medical history, surgical history, family history, social history, and previous encounter notes.   I, Trixie Dredge, am acting as transcriptionist for Dennard Nip, MD.  I have reviewed the above documentation for accuracy and completeness, and I agree with the above. -  Dennard Nip, MD

## 2022-06-24 NOTE — Progress Notes (Signed)
TeleHealth Visit:  This visit was completed with telemedicine (audio/video) technology. Carla Hanson has verbally consented to this TeleHealth visit. The patient is located at home, the provider is located at home. The participants in this visit include the listed provider and patient. The visit was conducted today via MyChart video.  OBESITY Carla Hanson is here to discuss her progress with her obesity treatment plan along with follow-up of her obesity related diagnoses.   Today's visit was # 28 Starting weight: 324 lbs Starting date: 01/03/2021 Weight at last in office visit: 297 lbs on 06/12/22 Total weight loss: 27 lbs at last in office visit on 06/12/22. Today's reported weight: No weight reported.  Nutrition Plan: following a lower carbohydrate, vegetable and lean protein rich diet plan.  Hunger is well controlled. Cravings are moderately controlled.  Current exercise: doing cardio and weights for 60 minutes 5 times per week.  Interim History: Carla Hanson is doing well with the low-carb plan.  She said it was very hard at first and she "crashed" about 3 weeks in-felt very fatigued.  She is feeling well now.  She is not craving carbohydrates as much. She generally eats 2 meals per day and estimates she gets about two 3 ounce servings of meat and also eggs and cheese.  She eats plenty of vegetables and water intake is good.  Assessment/Plan:  1. Hypertension Hypertension well controlled.  Home blood pressures have run 115/56, 112/64, 118/71. Medication(s): Losartan 50 mg daily and HCTZ 25 mg daily.  She is compliant with medications.  BP Readings from Last 3 Encounters:  06/12/22 (!) 143/86  05/29/22 (!) 161/87  05/15/22 (!) 150/71   Lab Results  Component Value Date   CREATININE 0.60 03/04/2022   CREATININE 0.66 11/20/2021   CREATININE 0.56 (L) 05/15/2021    Plan: Continue losartan 50 mg and HCTZ 25 mg daily.  2. Prediabetes C was 6.1 in May of this year.  It had been up to 6.4 on  12/19/2020. Medication(s): Metformin 500 mg twice daily with meals. Lab Results  Component Value Date   HGBA1C 6.1 (H) 03/04/2022   Lab Results  Component Value Date   INSULIN 81.0 (H) 03/04/2022   INSULIN 60.8 (H) 11/20/2021   INSULIN 64.3 (H) 05/21/2021   INSULIN 77.3 (H) 01/03/2021    Plan: Refill metformin 500 mg twice daily with meals.   3. Obesity: Current BMI 52.6 Carla Hanson is currently in the action stage of change. As such, her goal is to continue with weight loss efforts.  She has agreed to following a lower carbohydrate, vegetable and lean protein rich diet plan.   Exercise goals: as is  Behavioral modification strategies: meal planning and cooking strategies, better snacking choices, and planning for success.  Carla Hanson has agreed to follow-up with our clinic in 4 weeks.   No orders of the defined types were placed in this encounter.   Medications Discontinued During This Encounter  Medication Reason   metFORMIN (GLUCOPHAGE) 500 MG tablet Reorder     Meds ordered this encounter  Medications   metFORMIN (GLUCOPHAGE) 500 MG tablet    Sig: Take 1 tablet (500 mg total) by mouth 2 (two) times daily with a meal.    Dispense:  60 tablet    Refill:  1    Order Specific Question:   Supervising Provider    Answer:   Quillian Quince D [[id number]]      Objective:   VITALS: Per patient if applicable, see vitals. GENERAL: Alert and in no acute  distress. CARDIOPULMONARY: No increased WOB. Speaking in clear sentences.  PSYCH: Pleasant and cooperative. Speech normal rate and rhythm. Affect is appropriate. Insight and judgement are appropriate. Attention is focused, linear, and appropriate.  NEURO: Oriented as arrived to appointment on time with no prompting.   Lab Results  Component Value Date   CREATININE 0.60 03/04/2022   BUN 15 03/04/2022   NA 141 03/04/2022   K 3.8 03/04/2022   CL 101 03/04/2022   CO2 26 03/04/2022   Lab Results  Component Value Date   ALT 19  03/04/2022   AST 16 03/04/2022   ALKPHOS 51 03/04/2022   BILITOT 0.3 03/04/2022   Lab Results  Component Value Date   HGBA1C 6.1 (H) 03/04/2022   HGBA1C 6.2 (H) 11/20/2021   HGBA1C 6.3 (H) 05/21/2021   HGBA1C 6.4 (A) 12/19/2020   HGBA1C 6.1 04/11/2020   Lab Results  Component Value Date   INSULIN 81.0 (H) 03/04/2022   INSULIN 60.8 (H) 11/20/2021   INSULIN 64.3 (H) 05/21/2021   INSULIN 77.3 (H) 01/03/2021   Lab Results  Component Value Date   TSH 1.460 03/04/2022   Lab Results  Component Value Date   CHOL 149 03/04/2022   HDL 46 03/04/2022   LDLCALC 86 03/04/2022   TRIG 92 03/04/2022   CHOLHDL 5.7 (H) 05/15/2021   Lab Results  Component Value Date   WBC 8.8 01/03/2021   HGB 13.2 01/03/2021   HCT 41.4 01/03/2021   MCV 79 01/03/2021   PLT 346 01/03/2021   Lab Results  Component Value Date   IRON 32 12/03/2016   TIBC 363 12/03/2016   FERRITIN 13 (L) 02/09/2018   Lab Results  Component Value Date   VD25OH 57.1 03/04/2022   VD25OH 72.8 11/20/2021   VD25OH 42.6 05/15/2021    Attestation Statements:   Reviewed by clinician on day of visit: allergies, medications, problem list, medical history, surgical history, family history, social history, and previous encounter notes.

## 2022-06-25 ENCOUNTER — Other Ambulatory Visit (INDEPENDENT_AMBULATORY_CARE_PROVIDER_SITE_OTHER): Payer: Self-pay | Admitting: Family Medicine

## 2022-06-25 ENCOUNTER — Telehealth (INDEPENDENT_AMBULATORY_CARE_PROVIDER_SITE_OTHER): Payer: Medicaid Other | Admitting: Family Medicine

## 2022-06-25 ENCOUNTER — Encounter (INDEPENDENT_AMBULATORY_CARE_PROVIDER_SITE_OTHER): Payer: Self-pay | Admitting: Family Medicine

## 2022-06-25 DIAGNOSIS — R7303 Prediabetes: Secondary | ICD-10-CM

## 2022-06-25 DIAGNOSIS — Z6841 Body Mass Index (BMI) 40.0 and over, adult: Secondary | ICD-10-CM

## 2022-06-25 DIAGNOSIS — I10 Essential (primary) hypertension: Secondary | ICD-10-CM

## 2022-06-25 DIAGNOSIS — Z7984 Long term (current) use of oral hypoglycemic drugs: Secondary | ICD-10-CM

## 2022-06-25 DIAGNOSIS — E669 Obesity, unspecified: Secondary | ICD-10-CM

## 2022-06-25 MED ORDER — METFORMIN HCL 500 MG PO TABS: 500.0000 mg | ORAL_TABLET | Freq: Two times a day (BID) | ORAL | 1 refills | Status: DC

## 2022-07-18 ENCOUNTER — Ambulatory Visit (INDEPENDENT_AMBULATORY_CARE_PROVIDER_SITE_OTHER): Payer: Medicaid Other | Admitting: Family Medicine

## 2022-07-18 ENCOUNTER — Encounter (HOSPITAL_BASED_OUTPATIENT_CLINIC_OR_DEPARTMENT_OTHER): Payer: Self-pay | Admitting: Family Medicine

## 2022-07-18 ENCOUNTER — Ambulatory Visit (INDEPENDENT_AMBULATORY_CARE_PROVIDER_SITE_OTHER): Payer: Medicaid Other

## 2022-07-18 DIAGNOSIS — M25561 Pain in right knee: Secondary | ICD-10-CM

## 2022-07-18 NOTE — Patient Instructions (Signed)
  Medication Instructions:  Your physician recommends that you continue on your current medications as directed. Please refer to the Current Medication list given to you today. --If you need a refill on any your medications before your next appointment, please call your pharmacy first. If no refills are authorized on file call the office.-- Lab Work: Your physician has recommended that you have lab work today: No If you have labs (blood work) drawn today and your tests are completely normal, you will receive your results via [place] a phone call from our staff.  Please ensure you check your voicemail in the event that you authorized detailed messages to be left on a delegated number. If you have any lab test that is abnormal or we need to change your treatment, we will call you to review the results.  Referrals/Procedures/Imaging: X-rays   Follow-Up: Your next appointment:   Your physician recommends that you schedule a follow-up appointment in: 6-8 weeks follow-up with Dr. de [place].  You will receive a text message or e-mail with a link to a survey about your care and experience with [place] today! We would greatly appreciate your feedback!   Thanks for letting us be apart of your health journey!!  Primary Care and Sports Medicine   Dr. Arlina Robes [place]   We encourage you to activate your patient portal called "MyChart".  Sign up information is provided on this After Visit Summary.  MyChart is used to connect with patients for Virtual Visits (Telemedicine).  Patients are able to view lab/test results, encounter notes, upcoming appointments, etc.  Non-urgent messages can be sent to your provider as well. To learn more about what you can do with MyChart, please visit --  [email or web address].

## 2022-07-18 NOTE — Progress Notes (Unsigned)
    Procedures performed today:    None.  Independent interpretation of notes and tests performed by another provider:   None.  Brief History, Exam, Impression, and Recommendations:    BP (!) 140/53   Pulse 65   Ht '5\' 2"'$  (1.575 m)   Wt 297 lb 12.8 oz (135.1 kg)   SpO2 99%   BMI 54.47 kg/m   Right knee pain Patient is a 56 year old female presenting for evaluation of right knee pain.  She has had chronic intermittent issues with right knee, last evaluation was a few years ago.  At that time she did have x-rays completed which did show some degenerative changes.  Symptoms were managed with physical therapy, home exercise program.  More recently, she has had more knee pain with increased exercising.  Pain is primarily located along medial aspect of knee, medial aspect of patella.  She notes the pain is worse with increased walking, going up stairs.  She has not had any recent physical therapy.  Not aware of any specific swelling of the knee. On exam, Right knee: No obvious deformity. No effusion.  Negative patellar grind.  Negative crepitus. Full ROM for flexion and extension.  Strength 5 out of 5 for flexion and extension. Anterior drawer: Negative Posterior drawer: Negative Lachman: Negative Varus stress test: Negative Valgus stress test: Negative Neurovascularly intact.  No evidence of lymphatic disease.  History and exam suggest underlying osteoarthritis as well as component of patellofemoral pain syndrome leading to current knee pain.  Given that it has been a few years since her last knee x-rays, recommend proceeding with updated x-rays at this time.  Discussed suspected causes of current symptoms and treatment considerations.  Given prior positive response to physical therapy, would be reasonable to resume this.  Recommend home exercise program as per PT.  Intra-articular steroid injection would also be a possible consideration. For now, we will proceed with x-ray imaging,  referral to PT and plan for follow-up in about 6 to 8 weeks to assess progress.  If continuing of symptoms despite conservative measures, consider steroid injection, may need to consider ultrasound guidance due to habitus  Return in about 6 weeks (around 08/29/2022) for right knee.   ___________________________________________ Ronson Hagins [place], MD, ABFM, CAQSM [facility] [place]

## 2022-07-19 NOTE — Assessment & Plan Note (Addendum)
Patient is a 56 year old female presenting for evaluation of right knee pain.  She has had chronic intermittent issues with right knee, last evaluation was a few years ago.  At that time she did have x-rays completed which did show some degenerative changes.  Symptoms were managed with physical therapy, home exercise program.  More recently, she has had more knee pain with increased exercising.  Pain is primarily located along medial aspect of knee, medial aspect of patella.  She notes the pain is worse with increased walking, going up stairs.  She has not had any recent physical therapy.  Not aware of any specific swelling of the knee. On exam, Right knee: No obvious deformity. No effusion.  Negative patellar grind.  Negative crepitus. Full ROM for flexion and extension.  Strength 5 out of 5 for flexion and extension. Anterior drawer: Negative Posterior drawer: Negative Lachman: Negative Varus stress test: Negative Valgus stress test: Negative Neurovascularly intact.  No evidence of lymphatic disease.  History and exam suggest underlying osteoarthritis as well as component of patellofemoral pain syndrome leading to current knee pain.  Given that it has been a few years since her last knee x-rays, recommend proceeding with updated x-rays at this time.  Discussed suspected causes of current symptoms and treatment considerations.  Given prior positive response to physical therapy, would be reasonable to resume this.  Recommend home exercise program as per PT.  Intra-articular steroid injection would also be a possible consideration. For now, we will proceed with x-ray imaging, referral to PT and plan for follow-up in about 6 to 8 weeks to assess progress.  If continuing of symptoms despite conservative measures, consider steroid injection, may need to consider ultrasound guidance due to habitus

## 2022-07-22 ENCOUNTER — Encounter (INDEPENDENT_AMBULATORY_CARE_PROVIDER_SITE_OTHER): Payer: Self-pay | Admitting: Family Medicine

## 2022-07-22 ENCOUNTER — Other Ambulatory Visit (INDEPENDENT_AMBULATORY_CARE_PROVIDER_SITE_OTHER): Payer: Self-pay | Admitting: Family Medicine

## 2022-07-22 ENCOUNTER — Ambulatory Visit (INDEPENDENT_AMBULATORY_CARE_PROVIDER_SITE_OTHER): Payer: Medicaid Other | Admitting: Family Medicine

## 2022-07-22 VITALS — BP 157/83 | HR 56 | Temp 98.6°F | Ht 62.0 in | Wt 292.0 lb

## 2022-07-22 DIAGNOSIS — E669 Obesity, unspecified: Secondary | ICD-10-CM | POA: Diagnosis not present

## 2022-07-22 DIAGNOSIS — I10 Essential (primary) hypertension: Secondary | ICD-10-CM | POA: Diagnosis not present

## 2022-07-22 DIAGNOSIS — E559 Vitamin D deficiency, unspecified: Secondary | ICD-10-CM

## 2022-07-22 DIAGNOSIS — Z6841 Body Mass Index (BMI) 40.0 and over, adult: Secondary | ICD-10-CM

## 2022-07-22 DIAGNOSIS — R7303 Prediabetes: Secondary | ICD-10-CM

## 2022-07-22 DIAGNOSIS — E7849 Other hyperlipidemia: Secondary | ICD-10-CM

## 2022-07-22 DIAGNOSIS — J3089 Other allergic rhinitis: Secondary | ICD-10-CM | POA: Diagnosis not present

## 2022-07-22 MED ORDER — METFORMIN HCL 500 MG PO TABS: 500.0000 mg | ORAL_TABLET | Freq: Two times a day (BID) | ORAL | 0 refills | Status: DC

## 2022-07-22 MED ORDER — HYDROCHLOROTHIAZIDE 25 MG PO TABS: 25.0000 mg | ORAL_TABLET | Freq: Every day | ORAL | 0 refills | Status: DC

## 2022-07-22 MED ORDER — CETIRIZINE HCL 10 MG PO TABS: 10.0000 mg | ORAL_TABLET | Freq: Every day | ORAL | 0 refills | Status: DC

## 2022-07-22 MED ORDER — METFORMIN HCL 500 MG PO TABS: 500.0000 mg | ORAL_TABLET | Freq: Two times a day (BID) | ORAL | 1 refills | Status: DC

## 2022-07-23 ENCOUNTER — Encounter (INDEPENDENT_AMBULATORY_CARE_PROVIDER_SITE_OTHER): Payer: Self-pay | Admitting: Family Medicine

## 2022-07-23 LAB — LIPID PANEL WITH LDL/HDL RATIO
Cholesterol, Total: 147 mg/dL (ref 100–199)
HDL: 50 mg/dL (ref >39)
LDL Chol Calc (NIH): 78 mg/dL (ref 0–99)
LDL/HDL Ratio: 1.6 ratio (ref 0.0–3.2)
Triglycerides: 104 mg/dL (ref 0–149)
VLDL Cholesterol Cal: 19 mg/dL (ref 5–40)

## 2022-07-23 LAB — CMP14+EGFR
ALT: 17 IU/L (ref 0–32)
AST: 16 IU/L (ref 0–40)
Albumin/Globulin Ratio: 1.5 (ref 1.2–2.2)
Albumin: 4.3 g/dL (ref 3.8–4.9)
Alkaline Phosphatase: 48 IU/L (ref 44–121)
BUN/Creatinine Ratio: 22 (ref 9–23)
BUN: 12 mg/dL (ref 6–24)
Bilirubin Total: 0.4 mg/dL (ref 0.0–1.2)
CO2: 24 mmol/L (ref 20–29)
Calcium: 9.7 mg/dL (ref 8.7–10.2)
Chloride: 101 mmol/L (ref 96–106)
Creatinine, Ser: 0.55 mg/dL — ABNORMAL LOW (ref 0.57–1.00)
Globulin, Total: 2.8 g/dL (ref 1.5–4.5)
Glucose: 98 mg/dL (ref 70–99)
Potassium: 3.9 mmol/L (ref 3.5–5.2)
Sodium: 141 mmol/L (ref 134–144)
Total Protein: 7.1 g/dL (ref 6.0–8.5)
eGFR: 108 mL/min/{1.73_m2} (ref >59)

## 2022-07-23 LAB — HEMOGLOBIN A1C
Est. average glucose Bld gHb Est-mCnc: 134 mg/dL
Hgb A1c MFr Bld: 6.3 % — ABNORMAL HIGH (ref 4.8–5.6)

## 2022-07-23 LAB — CBC WITH DIFFERENTIAL/PLATELET
Basophils Absolute: 0.1 10*3/uL (ref 0.0–0.2)
Basos: 1 %
EOS (ABSOLUTE): 0.5 10*3/uL — ABNORMAL HIGH (ref 0.0–0.4)
Eos: 7 %
Hematocrit: 39.9 % (ref 34.0–46.6)
Hemoglobin: 12.7 g/dL (ref 11.1–15.9)
Immature Grans (Abs): 0 10*3/uL (ref 0.0–0.1)
Immature Granulocytes: 0 %
Lymphocytes Absolute: 2.6 10*3/uL (ref 0.7–3.1)
Lymphs: 36 %
MCH: 25.7 pg — ABNORMAL LOW (ref 26.6–33.0)
MCHC: 31.8 g/dL (ref 31.5–35.7)
MCV: 81 fL (ref 79–97)
Monocytes Absolute: 0.4 10*3/uL (ref 0.1–0.9)
Monocytes: 5 %
Neutrophils Absolute: 3.6 10*3/uL (ref 1.4–7.0)
Neutrophils: 51 %
Platelets: 314 10*3/uL (ref 150–450)
RBC: 4.95 x10E6/uL (ref 3.77–5.28)
RDW: 14.4 % (ref 11.7–15.4)
WBC: 7.1 10*3/uL (ref 3.4–10.8)

## 2022-07-23 LAB — INSULIN, RANDOM: INSULIN: 62 u[IU]/mL — ABNORMAL HIGH (ref 2.6–24.9)

## 2022-07-23 LAB — VITAMIN D 25 HYDROXY (VIT D DEFICIENCY, FRACTURES): Vit D, 25-Hydroxy: 44.6 ng/mL (ref 30.0–100.0)

## 2022-07-23 LAB — VITAMIN B12: Vitamin B-12: 493 pg/mL (ref 232–1245)

## 2022-07-23 LAB — TSH: TSH: 1.63 u[IU]/mL (ref 0.450–4.500)

## 2022-07-23 NOTE — Progress Notes (Unsigned)
Chief Complaint:   OBESITY Carla Hanson is here to discuss her progress with her obesity treatment plan along with follow-up of her obesity related diagnoses. Carla Hanson is on following a lower carbohydrate, vegetable and lean protein rich diet plan and states she is following her eating plan approximately 98% of the time. Carla Hanson states she is doing cardio 2 times per week, and at the gym for 90 minutes 3 times per week.  Today's visit was #: 61 Starting weight: 324 lbs Starting date: 01/03/2021 Today's weight: 292 lbs Today's date: 07/22/2022 Total lbs lost to date: 32 Total lbs lost since last in-office visit: 5  Interim History: Carla Hanson did an body analysis from the gym.  She is pleased with her results.  She is not always getting all of her meals and, but she is using some Premier protein shakes.  Subjective:   1. Pre-diabetes Carla Hanson is taking metformin 500 mg twice daily without side effects.  2. White coat syndrome with diagnosis of hypertension Carla Hanson's blood pressure is elevated in the office today, but much better on monitoring at home.  Her blood pressures at home on an average range between 105-110/70's  3. Allergic rhinitis due to other allergic trigger, unspecified seasonality Carla Hanson is taking tex without side effects.  4. Vitamin D deficiency Carla Hanson's last vitamin D level was at goal.  She is due to have labs rechecked as she is at high risk of over replacement.  5. Other hyperlipidemia Carla Hanson is on Crestor, and she is working on her diet, exercise, and weight loss.  No side effects were noted.  Assessment/Plan:   1. Pre-diabetes We will check labs today, and we will refill metformin for 90 days.  Carla Hanson will continue with her diet and exercise plan.  - CMP14+EGFR - Vitamin B12 - CBC with Differential/Platelet - Hemoglobin A1c - Insulin, random - TSH - metFORMIN (GLUCOPHAGE) 500 MG tablet; Take 1 tablet (500 mg total) by mouth 2 (two) times daily with a meal. PLEASE  DISREGARD PREVIOUS RX  Dispense: 180 tablet; Refill: 0  2. White coat syndrome with diagnosis of hypertension We will refill hydrochlorothiazide and losartan for 90 days.  Carla Hanson will continue monitoring her blood pressures at home, and will continue with her diet.  - hydrochlorothiazide (HYDRODIURIL) 25 MG tablet; Take 1 tablet (25 mg total) by mouth daily.  Dispense: 90 tablet; Refill: 0  3. Allergic rhinitis due to other allergic trigger, unspecified seasonality Carla Hanson will continue Zyrtec 10 mg once daily, and we will refill for 90 days.  - cetirizine (ZYRTEC) 10 MG tablet; Take 1 tablet (10 mg total) by mouth daily.  Dispense: 90 tablet; Refill: 0  4. Vitamin D deficiency We will check labs today. Carla Hanson will follow-up for routine testing of Vitamin D, at least 2-3 times per year to avoid over-replacement.  - VITAMIN D 25 Hydroxy (Vit-D Deficiency, Fractures)  5. Other hyperlipidemia We will check labs today, and Carla Hanson will continue with her diet, exercise, and medications as is.   - Lipid Panel With LDL/HDL Ratio  6. Obesity, Current BMI 53.5 Carla Hanson is currently in the action stage of change. As such, her goal is to continue with weight loss efforts. She has agreed to following a lower carbohydrate, vegetable and lean protein rich diet plan.   Discussed journaling 1200-1600 calories with 85+ grams of protein.  Exercise goals: For substantial health benefits, adults should do at least 150 minutes (2 hours and 30 minutes) a week of moderate-intensity, or 75  minutes (1 hour and 15 minutes) a week of vigorous-intensity aerobic physical activity, or an equivalent combination of moderate- and vigorous-intensity aerobic activity. Aerobic activity should be performed in episodes of at least 10 minutes, and preferably, it should be spread throughout the week.  Behavioral modification strategies: increasing lean protein intake, decreasing simple carbohydrates, and no skipping  meals.  Carla Hanson has agreed to follow-up with our clinic in 10 to 12 weeks. She was informed of the importance of frequent follow-up visits to maximize her success with intensive lifestyle modifications for her multiple health conditions.   Carla Hanson was informed we would discuss her lab results at her next visit unless there is a critical issue that needs to be addressed sooner. Carla Hanson agreed to keep her next visit at the agreed upon time to discuss these results.  Objective:   Blood pressure (!) 157/83, pulse (!) 56, temperature 98.6 F (37 C), height $RemoveBe'5\' 2"'[id number]$  (1.575 m), weight 292 lb (132.5 kg), SpO2 95 %. Body mass index is 53.41 kg/m.  General: Cooperative, alert, well developed, in no acute distress. HEENT: Conjunctivae and lids unremarkable. Cardiovascular: Regular rhythm.  Lungs: Normal work of breathing. Neurologic: No focal deficits.   Lab Results  Component Value Date   CREATININE 0.55 (L) 07/22/2022   BUN 12 07/22/2022   NA 141 07/22/2022   K 3.9 07/22/2022   CL 101 07/22/2022   CO2 24 07/22/2022   Lab Results  Component Value Date   ALT 17 07/22/2022   AST 16 07/22/2022   ALKPHOS 48 07/22/2022   BILITOT 0.4 07/22/2022   Lab Results  Component Value Date   HGBA1C 6.3 (H) 07/22/2022   HGBA1C 6.1 (H) 03/04/2022   HGBA1C 6.2 (H) 11/20/2021   HGBA1C 6.3 (H) 05/21/2021   HGBA1C 6.4 (A) 12/19/2020   Lab Results  Component Value Date   INSULIN 62.0 (H) 07/22/2022   INSULIN 81.0 (H) 03/04/2022   INSULIN 60.8 (H) 11/20/2021   INSULIN 64.3 (H) 05/21/2021   INSULIN 77.3 (H) 01/03/2021   Lab Results  Component Value Date   TSH 1.630 07/22/2022   Lab Results  Component Value Date   CHOL 147 07/22/2022   HDL 50 07/22/2022   LDLCALC 78 07/22/2022   TRIG 104 07/22/2022   CHOLHDL 5.7 (H) 05/15/2021   Lab Results  Component Value Date   VD25OH 44.6 07/22/2022   VD25OH 57.1 03/04/2022   VD25OH 72.8 11/20/2021   Lab Results  Component Value Date   WBC 7.1  07/22/2022   HGB 12.7 07/22/2022   HCT 39.9 07/22/2022   MCV 81 07/22/2022   PLT 314 07/22/2022   Lab Results  Component Value Date   IRON 32 12/03/2016   TIBC 363 12/03/2016   FERRITIN 13 (L) 02/09/2018   Attestation Statements:   Reviewed by clinician on day of visit: allergies, medications, problem list, medical history, surgical history, family history, social history, and previous encounter notes.  Time spent on visit including pre-visit chart review and post-visit care and charting was 40 minutes.   I, Trixie Dredge, am acting as transcriptionist for Dennard Nip, MD.  I have reviewed the above documentation for accuracy and completeness, and I agree with the above. -  Dennard Nip, MD

## 2022-07-24 MED ORDER — LOSARTAN POTASSIUM 50 MG PO TABS: ORAL_TABLET | ORAL | 0 refills | Status: DC

## 2022-07-29 ENCOUNTER — Telehealth (INDEPENDENT_AMBULATORY_CARE_PROVIDER_SITE_OTHER): Payer: Medicaid Other | Admitting: Family Medicine

## 2022-08-01 ENCOUNTER — Telehealth (INDEPENDENT_AMBULATORY_CARE_PROVIDER_SITE_OTHER): Payer: Medicaid Other | Admitting: Family Medicine

## 2022-08-01 ENCOUNTER — Encounter (INDEPENDENT_AMBULATORY_CARE_PROVIDER_SITE_OTHER): Payer: Self-pay | Admitting: Family Medicine

## 2022-08-01 DIAGNOSIS — Z6841 Body Mass Index (BMI) 40.0 and over, adult: Secondary | ICD-10-CM

## 2022-08-01 DIAGNOSIS — R7303 Prediabetes: Secondary | ICD-10-CM

## 2022-08-01 DIAGNOSIS — E669 Obesity, unspecified: Secondary | ICD-10-CM | POA: Diagnosis not present

## 2022-08-01 MED ORDER — METFORMIN HCL 500 MG PO TABS: 500.0000 mg | ORAL_TABLET | Freq: Three times a day (TID) | ORAL | 0 refills | Status: DC

## 2022-08-06 NOTE — Progress Notes (Signed)
TeleHealth Visit:  Due to the COVID-19 pandemic, this visit was completed with telemedicine (audio/video) technology to reduce patient and provider exposure as well as to preserve personal protective equipment.   Carla Hanson has verbally consented to this TeleHealth visit. The patient is located at home, the provider is located at the [facility] office. The participants in this visit include the listed provider and patient. The visit was conducted today via MyChart video.   Chief Complaint: OBESITY Carla Hanson is here to discuss her progress with her obesity treatment plan along with follow-up of her obesity related diagnoses. Carla Hanson is on keeping a food journal and adhering to recommended goals of 1200-1600 calories and 85+ grams of protein daily and states she is following her eating plan approximately 98% of the time. Carla Hanson states she is doing cardio and weights for 75 minutes 5 times per week.  Today's visit was #: 30 Starting weight: 324 lbs Starting date: 01/03/2021  Interim History: Carla Hanson has been struggling with her weight loss recently and she is on vacation. She is concerned about worsening glucose levels   Subjective:   1. Pre-diabetes Carla Hanson's last A1c was elevated and worsening, and it is closer to diabetes mellitus despite being on metformin and trying to decrease simple carbohydrates. I discussed labs with the patient today.  Assessment/Plan:   1. Pre-diabetes Carla Hanson agreed to increase metformin to 500 mg TID, and we will refill for 1 month.  - metFORMIN (GLUCOPHAGE) 500 MG tablet; Take 1 tablet (500 mg total) by mouth 3 (three) times daily with meals.  Dispense: 90 tablet; Refill: 0  2. Obesity, Current BMI 53.5 Carla Hanson is currently in the action stage of change. As such, her goal is to continue with weight loss efforts. She has agreed to practicing portion control and making smarter food choices, such as increasing vegetables and decreasing simple  carbohydrates while on vacation.   Behavioral modification strategies: increasing lean protein intake and travel eating strategies.  Carla Hanson has agreed to follow-up with our clinic in 3 to 4 weeks. She was informed of the importance of frequent follow-up visits to maximize her success with intensive lifestyle modifications for her multiple health conditions.  Objective:   VITALS: Per patient if applicable, see vitals. GENERAL: Alert and in no acute distress. CARDIOPULMONARY: No increased WOB. Speaking in clear sentences.  PSYCH: Pleasant and cooperative. Speech normal rate and rhythm. Affect is appropriate. Insight and judgement are appropriate. Attention is focused, linear, and appropriate.  NEURO: Oriented as arrived to appointment on time with no prompting.   Lab Results  Component Value Date   CREATININE 0.55 (L) 07/22/2022   BUN 12 07/22/2022   NA 141 07/22/2022   K 3.9 07/22/2022   CL 101 07/22/2022   CO2 24 07/22/2022   Lab Results  Component Value Date   ALT 17 07/22/2022   AST 16 07/22/2022   ALKPHOS 48 07/22/2022   BILITOT 0.4 07/22/2022   Lab Results  Component Value Date   HGBA1C 6.3 (H) 07/22/2022   HGBA1C 6.1 (H) 03/04/2022   HGBA1C 6.2 (H) 11/20/2021   HGBA1C 6.3 (H) 05/21/2021   HGBA1C 6.4 (A) 12/19/2020   Lab Results  Component Value Date   INSULIN 62.0 (H) 07/22/2022   INSULIN 81.0 (H) 03/04/2022   INSULIN 60.8 (H) 11/20/2021   INSULIN 64.3 (H) 05/21/2021   INSULIN 77.3 (H) 01/03/2021   Lab Results  Component Value Date   TSH 1.630 07/22/2022   Lab Results  Component  Value Date   CHOL 147 07/22/2022   HDL 50 07/22/2022   LDLCALC 78 07/22/2022   TRIG 104 07/22/2022   CHOLHDL 5.7 (H) 05/15/2021   Lab Results  Component Value Date   VD25OH 44.6 07/22/2022   VD25OH 57.1 03/04/2022   VD25OH 72.8 11/20/2021   Lab Results  Component Value Date   WBC 7.1 07/22/2022   HGB 12.7 07/22/2022   HCT 39.9 07/22/2022   MCV 81 07/22/2022   PLT 314  07/22/2022   Lab Results  Component Value Date   IRON 32 12/03/2016   TIBC 363 12/03/2016   FERRITIN 13 (L) 02/09/2018    Attestation Statements:   Reviewed by clinician on day of visit: allergies, medications, problem list, medical history, surgical history, family history, social history, and previous encounter notes.   I, Trixie Dredge, am acting as transcriptionist for Dennard Nip, MD.  I have reviewed the above documentation for accuracy and completeness, and I agree with the above. - Dennard Nip, MD

## 2022-08-11 ENCOUNTER — Other Ambulatory Visit: Payer: Self-pay | Admitting: Family Medicine

## 2022-08-12 NOTE — Progress Notes (Unsigned)
TeleHealth Visit:  This visit was completed with telemedicine (audio/video) technology. Carla Hanson has verbally consented to this TeleHealth visit. The patient is located at home, the provider is located at home. The participants in this visit include the listed provider and patient. The visit was conducted today via MyChart video.  OBESITY Carla Hanson is here to discuss her progress with her obesity treatment plan along with follow-up of her obesity related diagnoses.   Today's visit was # 31 Starting weight: 324 lbs Starting date: 01/03/2021 Weight at last in office visit: 292 lbs on 07/22/22 Total weight loss: 32 lbs at last in office visit on 07/22/22. Today's reported weight: *** lbs No weight reported.  Nutrition Plan: practicing portion control and making smarter food choices, such as increasing vegetables and decreasing simple carbohydrates.   Current exercise: {exercise types:16438}  Interim History: ***  Assessment/Plan:  1. ***  2. ***  3. ***  Obesity: Current BMI *** Carla Hanson {CHL AMB IS/IS NOT:210130109} currently in the action stage of change. As such, her goal is to {MWMwtloss#1:210800005}.  She has agreed to {MWMwtlossportion/plan2:23431}.   Exercise goals: {MWM EXERCISE RECS:23473}  Behavioral modification strategies: {MWMwtlossdietstrategies3:23432}.  [name] has agreed to follow-up with our clinic in {NUMBER 1-10:22536} weeks.   No orders of the defined types were placed in this encounter.   There are no discontinued medications.   No orders of the defined types were placed in this encounter.     Objective:   VITALS: Per patient if applicable, see vitals. GENERAL: Alert and in no acute distress. CARDIOPULMONARY: No increased WOB. Speaking in clear sentences.  PSYCH: Pleasant and cooperative. Speech normal rate and rhythm. Affect is appropriate. Insight and judgement are appropriate. Attention is focused, linear, and appropriate.  NEURO: Oriented as  arrived to appointment on time with no prompting.   Lab Results  Component Value Date   CREATININE 0.55 (L) 07/22/2022   BUN 12 07/22/2022   NA 141 07/22/2022   K 3.9 07/22/2022   CL 101 07/22/2022   CO2 24 07/22/2022   Lab Results  Component Value Date   ALT 17 07/22/2022   AST 16 07/22/2022   ALKPHOS 48 07/22/2022   BILITOT 0.4 07/22/2022   Lab Results  Component Value Date   HGBA1C 6.3 (H) 07/22/2022   HGBA1C 6.1 (H) 03/04/2022   HGBA1C 6.2 (H) 11/20/2021   HGBA1C 6.3 (H) 05/21/2021   HGBA1C 6.4 (A) 12/19/2020   Lab Results  Component Value Date   INSULIN 62.0 (H) 07/22/2022   INSULIN 81.0 (H) 03/04/2022   INSULIN 60.8 (H) 11/20/2021   INSULIN 64.3 (H) 05/21/2021   INSULIN 77.3 (H) 01/03/2021   Lab Results  Component Value Date   TSH 1.630 07/22/2022   Lab Results  Component Value Date   CHOL 147 07/22/2022   HDL 50 07/22/2022   LDLCALC 78 07/22/2022   TRIG 104 07/22/2022   CHOLHDL 5.7 (H) 05/15/2021   Lab Results  Component Value Date   WBC 7.1 07/22/2022   HGB 12.7 07/22/2022   HCT 39.9 07/22/2022   MCV 81 07/22/2022   PLT 314 07/22/2022   Lab Results  Component Value Date   IRON 32 12/03/2016   TIBC 363 12/03/2016   FERRITIN 13 (L) 02/09/2018   Lab Results  Component Value Date   VD25OH 44.6 07/22/2022   VD25OH 57.1 03/04/2022   VD25OH 72.8 11/20/2021    Attestation Statements:   Reviewed by clinician on day of visit: allergies, medications, problem list, medical history,  surgical history, family history, social history, and previous encounter notes.  ***(delete if time-based billing not used) Time spent on visit including the items listed below was *** minutes.  -preparing to see the patient (e.g., review of tests, history, previous notes) -obtaining and/or reviewing separately obtained history -counseling and educating the patient/family/caregiver -documenting clinical information in the electronic or other health record -ordering  medications, tests, or procedures -independently interpreting results and communicating results to the patient/ family/caregiver -referring and communicating with other health care professionals  -care coordination

## 2022-08-13 ENCOUNTER — Encounter (INDEPENDENT_AMBULATORY_CARE_PROVIDER_SITE_OTHER): Payer: Self-pay | Admitting: Family Medicine

## 2022-08-13 ENCOUNTER — Telehealth (INDEPENDENT_AMBULATORY_CARE_PROVIDER_SITE_OTHER): Payer: Medicaid Other | Admitting: Family Medicine

## 2022-08-13 DIAGNOSIS — E669 Obesity, unspecified: Secondary | ICD-10-CM | POA: Diagnosis not present

## 2022-08-13 DIAGNOSIS — E559 Vitamin D deficiency, unspecified: Secondary | ICD-10-CM | POA: Diagnosis not present

## 2022-08-13 DIAGNOSIS — I10 Essential (primary) hypertension: Secondary | ICD-10-CM

## 2022-08-13 DIAGNOSIS — Z6841 Body Mass Index (BMI) 40.0 and over, adult: Secondary | ICD-10-CM | POA: Diagnosis not present

## 2022-08-13 MED ORDER — VITAMIN D (ERGOCALCIFEROL) 1.25 MG (50000 UNIT) PO CAPS: 50000.0000 [IU] | ORAL_CAPSULE | ORAL | 0 refills | Status: DC

## 2022-08-20 ENCOUNTER — Encounter (INDEPENDENT_AMBULATORY_CARE_PROVIDER_SITE_OTHER): Payer: Self-pay | Admitting: Family Medicine

## 2022-08-26 DIAGNOSIS — H43813 Vitreous degeneration, bilateral: Secondary | ICD-10-CM | POA: Diagnosis not present

## 2022-08-26 DIAGNOSIS — H35373 Puckering of macula, bilateral: Secondary | ICD-10-CM | POA: Diagnosis not present

## 2022-08-26 DIAGNOSIS — H35413 Lattice degeneration of retina, bilateral: Secondary | ICD-10-CM | POA: Diagnosis not present

## 2022-08-26 DIAGNOSIS — H3589 Other specified retinal disorders: Secondary | ICD-10-CM | POA: Diagnosis not present

## 2022-08-26 DIAGNOSIS — H31093 Other chorioretinal scars, bilateral: Secondary | ICD-10-CM | POA: Diagnosis not present

## 2022-08-28 ENCOUNTER — Ambulatory Visit (HOSPITAL_BASED_OUTPATIENT_CLINIC_OR_DEPARTMENT_OTHER): Payer: Medicaid Other | Admitting: Family Medicine

## 2022-08-29 ENCOUNTER — Encounter: Payer: Self-pay | Admitting: Family Medicine

## 2022-08-29 ENCOUNTER — Ambulatory Visit (HOSPITAL_BASED_OUTPATIENT_CLINIC_OR_DEPARTMENT_OTHER): Payer: Medicaid Other | Attending: Family Medicine | Admitting: Physical Therapy

## 2022-08-29 ENCOUNTER — Encounter (HOSPITAL_BASED_OUTPATIENT_CLINIC_OR_DEPARTMENT_OTHER): Payer: Self-pay | Admitting: Physical Therapy

## 2022-08-29 ENCOUNTER — Encounter (INDEPENDENT_AMBULATORY_CARE_PROVIDER_SITE_OTHER): Payer: Self-pay | Admitting: Family Medicine

## 2022-08-29 DIAGNOSIS — M25561 Pain in right knee: Secondary | ICD-10-CM | POA: Diagnosis not present

## 2022-08-29 DIAGNOSIS — G8929 Other chronic pain: Secondary | ICD-10-CM | POA: Insufficient documentation

## 2022-08-29 DIAGNOSIS — R2689 Other abnormalities of gait and mobility: Secondary | ICD-10-CM | POA: Insufficient documentation

## 2022-08-29 NOTE — Therapy (Signed)
OUTPATIENT PHYSICAL THERAPY LOWER EXTREMITY EVALUATION   Patient Name: Carla Hanson MRN: [id number] DOB:07-18-1966, 56 y.o., female Today's Date: 08/29/2022   PT End of Session - 08/29/22 1310     Visit Number 1    Number of Visits 12    Date for PT Re-Evaluation 10/10/22    Authorization Type --    Authorization Time Period --    PT Start Time 1100    PT Stop Time 1143    PT Time Calculation (min) 43 min    Activity Tolerance Patient tolerated treatment well    Behavior During Therapy [facility] for tasks assessed/performed             Past Medical History:  Diagnosis Date   Alcohol abuse    Anxiety    Back pain    Bipolar disorder ([place])    Bronchitis    COPD exacerbation ([place]) 05/10/2016   Drug use    History of blood clots    Hyperlipidemia    Hypertension    Influenza A 12/01/2018   Joint pain    Left leg DVT ([place]) 09/29/2013   Lipoma    Abdomen   LIPOMA 01/20/2008   Obesity    Occasional tremors 05/12/2019   Osteoarthritis of right knee    Other fatigue    Painful menstrual periods 01/05/2012   Pneumonia    Prediabetes    Seizure ([place])    Shortness of breath on exertion    Stroke ([place])    Tobacco abuse    Past Surgical History:  Procedure Laterality Date   CESAREAN SECTION     CYSTECTOMY     LIPOMA EXCISION  03/2011   ORIF ANKLE FRACTURE  03/13/2012   Procedure: OPEN REDUCTION INTERNAL FIXATION (ORIF) ANKLE FRACTURE;  Surgeon: Jessy Oto, MD;  Location: [facility];  Service: Orthopedics;  Laterality: Right;   TUBAL LIGATION     Patient Active Problem List   Diagnosis Date Noted   Right knee pain 07/18/2022   Other hyperlipidemia 05/15/2022   Seborrheic keratoses 02/26/2022   Allergic rhinitis 07/25/2021   Vitamin D deficiency 12/19/2020   Palpitation 12/03/2019   History of intracranial hemorrhage 08/02/2019   History of DVT of lower extremity 08/02/2019   History of seizure 08/02/2019   COPD, moderate ([place]) 12/11/2018   Lumbar back pain 02/11/2018    History of stroke 08/13/2017   Iron deficiency anemia 12/12/2016   Pre-diabetes 03/02/2013   History of pulmonary embolism 05/05/2012   Former tobacco use 08/23/2009   Hyperlipidemia 04/30/2007   Class 3 severe obesity with serious comorbidity and body mass index (BMI) of 50.0 to 59.9 in adult [facility]) 01/01/2007   Essential hypertension 01/01/2007    PCP: Dr Andrena Mews   REFERRING PROVIDER: Dr Debbra Riding   REFERRING DIAG: [id number] (ICD-10-CM) - Right knee pain, unspecified chronicity  THERAPY DIAG:  Chronic pain of right knee  Other abnormalities of gait and mobility  Rationale for Evaluation and Treatment Rehabilitation  ONSET DATE: long standing knee and shoulder pain   SUBJECTIVE:   SUBJECTIVE STATEMENT: The patient has a long history of right knee pain. She feels like the pain has gotten worse recently. She has more pain. She has also had pain in both of her shoulders.   PERTINENT HISTORY: Anxiety, Low back pain, COPD, Blood Clots, OA of the right knee; Obesity, Left leg DVT; Seizures; Stroke Tobacco abuse   PAIN:  Are you having pain? Yes: NPRS scale: 6/10 at worst  Pain location: lateral knee  Pain description: aching Aggravating factors: pain gets worse as she exercises  Relieving factors: ice and rest   Are you having pain? Yes: NPRS scale: 5/10 at worst  Pain location: right shoulder pain and left is also achy  Pain description: aching Aggravating factors: had a recent severe exacerbation of her right shoulder pain  Relieving factors: ice and rest    PRECAUTIONS: None  WEIGHT BEARING RESTRICTIONS: No  FALLS:  Has patient fallen in last 6 months? No  LIVING ENVIRONMENT: Step up into the house  OCCUPATION:  Not working   Hobbies:  Walking; reading; going to the gym; going to church, music and signing    PLOF: Independent  PATIENT GOALS:   To have less pain in her knees and shoulders    OBJECTIVE:   DIAGNOSTIC FINDINGS:  X-ray of  the right knee:  IMPRESSION: Moderate to severe medial compartment and mild to moderate patellofemoral compartment osteoarthritis.   PATIENT SURVEYS:  FOTO    COGNITION: Overall cognitive status: Within functional limits for tasks assessed     SENSATION: WFL  EDEMA:    POSTURE: rounded shoulders and forward head Right knee valgus in standing   PALPATION: Limited patella motion   LOWER EXTREMITY ROM:  Active ROM Right eval Left eval  Hip flexion    Hip extension    Hip abduction    Hip adduction    Hip internal rotation    Hip external rotation    Knee flexion Pain at end range    Knee extension Mild extension limitation    Ankle dorsiflexion    Ankle plantarflexion    Ankle inversion    Ankle eversion     (Blank rows = not tested)  LOWER EXTREMITY MMT:  MMT Right eval Left eval  Hip flexion 34.5 24.7  Hip extension    Hip abduction 45.4 35.2  Hip adduction    Hip internal rotation    Hip external rotation    Knee flexion    Knee extension 38.9 43.3  Ankle dorsiflexion    Ankle plantarflexion    Ankle inversion    Ankle eversion     (Blank rows = not tested)    GAIT:  right knee valgus     TODAY'S TREATMENT                                                                          DATE: Reviewed self patella mobility   Access Code: [email or web address] URL: [email or web address] Date: 08/29/2022 Prepared by: Carolyne Littles  Exercises - Supine Quad Set  - 1 x daily - 7 x weekly - 3 sets - 10 reps - Small Range Straight Leg Raise  - 1 x daily - 7 x weekly - 3 sets - 10 reps - 4 Way Patellar Glide  - 1 x daily - 7 x weekly - 3 sets - 10 reps  PATIENT EDUCATION:  Education details: EP, symptom management; patella mobility  Person educated: Patient Education method: Explanation, Demonstration, Tactile cues, Verbal cues, and Handouts Education comprehension: verbalized understanding, returned demonstration, verbal cues required, tactile  cues required, and needs further education  HOME EXERCISE PROGRAM:   ASSESSMENT:  CLINICAL IMPRESSION: Patient is  a 56 y.o. female who was seen today for physical therapy evaluation and treatment for right knee pain. She has increased pain with activity. She has had knee issuses before but the pain now has increased over the past few months. She has pain with end range flexion and limited patella motion. Her patella sits laterally compared to the other knee. She reports popping with activity. Interestingly her left leg is weaker then her right. Her stoke did effect her left side. She is coming to the gym here. She would benefi from skilled therapy to improve patella motion.   OBJECTIVE IMPAIRMENTS: Abnormal gait, decreased activity tolerance, difficulty walking, decreased ROM, decreased strength, and pain.   ACTIVITY LIMITATIONS: carrying, lifting, bending, sitting, standing, squatting, stairs, transfers, bathing, toileting, dressing, and locomotion level  PARTICIPATION LIMITATIONS: meal prep, cleaning, laundry, shopping, community activity, and yard work  PERSONAL FACTORS: 3+ comorbidities: Anxiety, Low back pain, COPD, Blood Clots, OA of the right knee; Obesity, Left leg DVT; Seizures; Stroke Tobacco abuse  are also affecting patient's functional outcome.   REHAB POTENTIAL: Good  CLINICAL DECISION MAKING: Evolving/moderate complexity increasing pain with activity   EVALUATION COMPLEXITY: Moderate   GOALS: Goals reviewed with patient? Yes  SHORT TERM GOALS: Target date: 09/19/2022  Patient will increase left leg gross strength by 5 lbs  Baseline: Goal status: INITIAL  2.  Patient will demonstrate improved patella motion and less popping  Baseline:  Goal status: INITIAL  3.  Patient will be be able to modify her program when he knee OA flares up  Baseline:  Goal status: INITIAL   LONG TERM GOALS: Target date: 10/10/2022   Patient will return to a full gym program without  significant pain  Baseline:  Goal status: INITIAL  2.  Patient will have a full pool program  Baseline:  Goal status: INITIAL  3.  Patient will demonstrate equa strength left and right in order to walk in the community without increased stresss on the right leg.  Baseline:  Goal status: INITIAL    PLAN:  PT FREQUENCY: 1-2x/week  PT DURATION: 6 weeks  PLANNED INTERVENTIONS: Therapeutic exercises, Therapeutic activity, Neuromuscular re-education, Balance training, Gait training, Patient/Family education, Self Care, Joint mobilization, Stair training, Aquatic Therapy, Dry Needling, Cryotherapy, Moist heat, Taping, Ultrasound, and Manual therapy  PLAN FOR NEXT SESSION: continue with patella motion; reviewe ome strengthening. Add in bridging. Consider taping. In the pool review gait training and hip strengthening.    Carney Living, PT 08/29/2022, 9:17 PM

## 2022-09-02 ENCOUNTER — Other Ambulatory Visit (INDEPENDENT_AMBULATORY_CARE_PROVIDER_SITE_OTHER): Payer: Self-pay | Admitting: Family Medicine

## 2022-09-02 DIAGNOSIS — R7303 Prediabetes: Secondary | ICD-10-CM

## 2022-09-04 ENCOUNTER — Ambulatory Visit (HOSPITAL_BASED_OUTPATIENT_CLINIC_OR_DEPARTMENT_OTHER): Payer: Medicaid Other | Attending: Family Medicine | Admitting: Physical Therapy

## 2022-09-04 ENCOUNTER — Encounter (HOSPITAL_BASED_OUTPATIENT_CLINIC_OR_DEPARTMENT_OTHER): Payer: Self-pay | Admitting: Physical Therapy

## 2022-09-04 DIAGNOSIS — G8929 Other chronic pain: Secondary | ICD-10-CM | POA: Diagnosis present

## 2022-09-04 DIAGNOSIS — R2689 Other abnormalities of gait and mobility: Secondary | ICD-10-CM | POA: Diagnosis present

## 2022-09-04 DIAGNOSIS — M25512 Pain in left shoulder: Secondary | ICD-10-CM | POA: Insufficient documentation

## 2022-09-04 DIAGNOSIS — M25561 Pain in right knee: Secondary | ICD-10-CM | POA: Insufficient documentation

## 2022-09-04 DIAGNOSIS — M25511 Pain in right shoulder: Secondary | ICD-10-CM | POA: Diagnosis present

## 2022-09-04 NOTE — Therapy (Signed)
OUTPATIENT PHYSICAL THERAPY LOWER EXTREMITY Treatment    Patient Name: Carla Hanson MRN: [id number] DOB:07-16-1966, 56 y.o., female Today's Date: 09/04/2022   PT End of Session - 09/04/22 1149     Visit Number 2    Number of Visits 12    Date for PT Re-Evaluation 10/10/22    PT Start Time 0930    PT Stop Time 1012    PT Time Calculation (min) 42 min    Activity Tolerance Patient tolerated treatment well    Behavior During Therapy [facility] for tasks assessed/performed              Past Medical History:  Diagnosis Date   Alcohol abuse    Anxiety    Back pain    Bipolar disorder ([place])    Bronchitis    COPD exacerbation ([place]) 05/10/2016   Drug use    History of blood clots    Hyperlipidemia    Hypertension    Influenza A 12/01/2018   Joint pain    Left leg DVT ([place]) 09/29/2013   Lipoma    Abdomen   LIPOMA 01/20/2008   Obesity    Occasional tremors 05/12/2019   Osteoarthritis of right knee    Other fatigue    Painful menstrual periods 01/05/2012   Pneumonia    Prediabetes    Seizure ([place])    Shortness of breath on exertion    Stroke ([place])    Tobacco abuse    Past Surgical History:  Procedure Laterality Date   CESAREAN SECTION     CYSTECTOMY     LIPOMA EXCISION  03/2011   ORIF ANKLE FRACTURE  03/13/2012   Procedure: OPEN REDUCTION INTERNAL FIXATION (ORIF) ANKLE FRACTURE;  Surgeon: Jessy Oto, MD;  Location: [facility];  Service: Orthopedics;  Laterality: Right;   TUBAL LIGATION     Patient Active Problem List   Diagnosis Date Noted   Right knee pain 07/18/2022   Other hyperlipidemia 05/15/2022   Seborrheic keratoses 02/26/2022   Allergic rhinitis 07/25/2021   Vitamin D deficiency 12/19/2020   Palpitation 12/03/2019   History of intracranial hemorrhage 08/02/2019   History of DVT of lower extremity 08/02/2019   History of seizure 08/02/2019   COPD, moderate ([place]) 12/11/2018   Lumbar back pain 02/11/2018   History of stroke 08/13/2017   Iron deficiency anemia  12/12/2016   Pre-diabetes 03/02/2013   History of pulmonary embolism 05/05/2012   Former tobacco use 08/23/2009   Hyperlipidemia 04/30/2007   Class 3 severe obesity with serious comorbidity and body mass index (BMI) of 50.0 to 59.9 in adult [facility]) 01/01/2007   Essential hypertension 01/01/2007    PCP: Dr Andrena Mews   REFERRING PROVIDER: Dr Debbra Riding   REFERRING DIAG: [phone] (ICD-10-CM) - Right knee pain, unspecified chronicity  THERAPY DIAG:  Chronic pain of right knee  Other abnormalities of gait and mobility  Rationale for Evaluation and Treatment Rehabilitation  ONSET DATE: long standing knee and shoulder pain   SUBJECTIVE:   SUBJECTIVE STATEMENT: The patient reports her right knee has been hurting her a lot. She had difficulty sleeping last night.  PERTINENT HISTORY: Anxiety, Low back pain, COPD, Blood Clots, OA of the right knee; Obesity, Left leg DVT; Seizures; Stroke Tobacco abuse   PAIN:  Are you having pain? Yes: NPRS scale: 6/10 today 11/1 Pain location: lateral knee  Pain description: aching Aggravating factors: pain gets worse as she exercises  Relieving factors: ice and rest   Are you having  pain? Yes: NPRS scale: 5/10 at worst  Pain location: right shoulder pain and left is also achy  Pain description: aching Aggravating factors: had a recent severe exacerbation of her right shoulder pain  Relieving factors: ice and rest    PRECAUTIONS: None  WEIGHT BEARING RESTRICTIONS: No  FALLS:  Has patient fallen in last 6 months? No  LIVING ENVIRONMENT: Step up into the house  OCCUPATION:  Not working   Hobbies:  Walking; reading; going to the gym; going to church, music and signing    PLOF: Independent  PATIENT GOALS:   To have less pain in her knees and shoulders    OBJECTIVE:   DIAGNOSTIC FINDINGS:  X-ray of the right knee:  IMPRESSION: Moderate to severe medial compartment and mild to moderate patellofemoral compartment  osteoarthritis.   PATIENT SURVEYS:  FOTO    COGNITION: Overall cognitive status: Within functional limits for tasks assessed     SENSATION: WFL  EDEMA:    POSTURE: rounded shoulders and forward head Right knee valgus in standing   PALPATION: Limited patella motion   LOWER EXTREMITY ROM:  Active ROM Right eval Left eval  Hip flexion    Hip extension    Hip abduction    Hip adduction    Hip internal rotation    Hip external rotation    Knee flexion Pain at end range    Knee extension Mild extension limitation    Ankle dorsiflexion    Ankle plantarflexion    Ankle inversion    Ankle eversion     (Blank rows = not tested)  LOWER EXTREMITY MMT:  MMT Right eval Left eval  Hip flexion 34.5 24.7  Hip extension    Hip abduction 45.4 35.2  Hip adduction    Hip internal rotation    Hip external rotation    Knee flexion    Knee extension 38.9 43.3  Ankle dorsiflexion    Ankle plantarflexion    Ankle inversion    Ankle eversion     (Blank rows = not tested)    GAIT:  right knee valgus     TODAY'S TREATMENT                                                                          DATE: 11/1 Manual: Grade 1 and II PA and PA glides for pain; STM to posterior knee and lateral knee. Reviewed self soft tissue mobilization for home   Quad set 3x15  SLR 3x10 fatigue towards the end SAQ 3x15   Heel raise x20  Standing slow march 2x15     Eval  Reviewed self patella mobility   Access Code: [email or web address] URL: [email or web address] Date: 08/29/2022 Prepared by: Carolyne Littles  Exercises - Supine Quad Set  - 1 x daily - 7 x weekly - 3 sets - 10 reps - Small Range Straight Leg Raise  - 1 x daily - 7 x weekly - 3 sets - 10 reps - 4 Way Patellar Glide  - 1 x daily - 7 x weekly - 3 sets - 10 reps  PATIENT EDUCATION:  Education details: EP, symptom management; patella mobility  Person educated: Patient Education method: Explanation,  Demonstration, Tactile cues, Verbal cues, and  Handouts Education comprehension: verbalized understanding, returned demonstration, verbal cues required, tactile cues required, and needs further education  HOME EXERCISE PROGRAM:   ASSESSMENT:  CLINICAL IMPRESSION: The patient had more pain today. She reports it has been sore over the past few days. Therapy focused on manual therapy as well as education on how to do on her own. Therapy will continue to progress as tolerated. She reported improvement after treatment.  OBJECTIVE IMPAIRMENTS: Abnormal gait, decreased activity tolerance, difficulty walking, decreased ROM, decreased strength, and pain.   ACTIVITY LIMITATIONS: carrying, lifting, bending, sitting, standing, squatting, stairs, transfers, bathing, toileting, dressing, and locomotion level  PARTICIPATION LIMITATIONS: meal prep, cleaning, laundry, shopping, community activity, and yard work  PERSONAL FACTORS: 3+ comorbidities: Anxiety, Low back pain, COPD, Blood Clots, OA of the right knee; Obesity, Left leg DVT; Seizures; Stroke Tobacco abuse  are also affecting patient's functional outcome.   REHAB POTENTIAL: Good  CLINICAL DECISION MAKING: Evolving/moderate complexity increasing pain with activity   EVALUATION COMPLEXITY: Moderate   GOALS: Goals reviewed with patient? Yes  SHORT TERM GOALS: Target date: 09/19/2022  Patient will increase left leg gross strength by 5 lbs  Baseline: Goal status: INITIAL  2.  Patient will demonstrate improved patella motion and less popping  Baseline:  Goal status: INITIAL  3.  Patient will be be able to modify her program when he knee OA flares up  Baseline:  Goal status: INITIAL   LONG TERM GOALS: Target date: 10/10/2022   Patient will return to a full gym program without significant pain  Baseline:  Goal status: INITIAL  2.  Patient will have a full pool program  Baseline:  Goal status: INITIAL  3.  Patient will demonstrate  equa strength left and right in order to walk in the community without increased stresss on the right leg.  Baseline:  Goal status: INITIAL    PLAN:  PT FREQUENCY: 1-2x/week  PT DURATION: 6 weeks  PLANNED INTERVENTIONS: Therapeutic exercises, Therapeutic activity, Neuromuscular re-education, Balance training, Gait training, Patient/Family education, Self Care, Joint mobilization, Stair training, Aquatic Therapy, Dry Needling, Cryotherapy, Moist heat, Taping, Ultrasound, and Manual therapy  PLAN FOR NEXT SESSION: continue with patella motion; reviewe ome strengthening. Add in bridging. Consider taping. In the pool review gait training and hip strengthening.    Carney Living, PT 09/04/2022, 11:50 AM

## 2022-09-05 ENCOUNTER — Encounter (HOSPITAL_BASED_OUTPATIENT_CLINIC_OR_DEPARTMENT_OTHER): Payer: Self-pay | Admitting: Family Medicine

## 2022-09-05 ENCOUNTER — Ambulatory Visit (INDEPENDENT_AMBULATORY_CARE_PROVIDER_SITE_OTHER): Payer: Medicaid Other | Admitting: Family Medicine

## 2022-09-05 ENCOUNTER — Ambulatory Visit (INDEPENDENT_AMBULATORY_CARE_PROVIDER_SITE_OTHER): Payer: Medicaid Other

## 2022-09-05 VITALS — BP 143/87 | HR 69 | Temp 97.6°F | Ht 62.0 in | Wt 292.7 lb

## 2022-09-05 DIAGNOSIS — M19012 Primary osteoarthritis, left shoulder: Secondary | ICD-10-CM | POA: Diagnosis not present

## 2022-09-05 DIAGNOSIS — M19011 Primary osteoarthritis, right shoulder: Secondary | ICD-10-CM | POA: Diagnosis not present

## 2022-09-05 DIAGNOSIS — G8929 Other chronic pain: Secondary | ICD-10-CM

## 2022-09-05 DIAGNOSIS — M25511 Pain in right shoulder: Secondary | ICD-10-CM | POA: Diagnosis not present

## 2022-09-05 DIAGNOSIS — M25561 Pain in right knee: Secondary | ICD-10-CM

## 2022-09-05 DIAGNOSIS — M25512 Pain in left shoulder: Secondary | ICD-10-CM

## 2022-09-05 NOTE — Assessment & Plan Note (Signed)
Patient also indicates that she has been having some bilateral shoulder pain.  She reports being told that she has had some issues with the rotator cuff in the past.  Pain is primarily worse with activity, currently she notes worsening with overhead movement, chest press exercise.  She denies any associated numbness or tingling.  No recent injury.  Both shoulders are painful, left is worse than right.  She is right-handed On exam, she has mild tenderness to palpation over left bicipital groove, otherwise no significant tenderness to palpation about the shoulder.  Strength is slightly reduced, however largely equal bilaterally.  On right side, empty can and Neer's are painful.  On left side, empty can, Hawkins and Neer's are painful. Suspect some component of impingement syndrome, it is also possible there is some fraying/degeneration of the rotator cuff tendons.  Additional consideration is potential underlying osteoarthritis. At this time we will proceed with initial x-ray imaging as she has not had any imaging in the past We can also proceed with initial physical therapy, referral placed today

## 2022-09-05 NOTE — Patient Instructions (Signed)
  Medication Instructions:  Your physician recommends that you continue on your current medications as directed. Please refer to the Current Medication list given to you today. --If you need a refill on any your medications before your next appointment, please call your pharmacy first. If no refills are authorized on file call the office.-- Lab Work: Your physician has recommended that you have lab work today: No If you have labs (blood work) drawn today and your tests are completely normal, you will receive your results via [place] a phone call from our staff.  Please ensure you check your voicemail in the event that you authorized detailed messages to be left on a delegated number. If you have any lab test that is abnormal or we need to change your treatment, we will call you to review the results.  Referrals/Procedures/Imaging: Yes, X-RAY  Follow-Up: Your next appointment:   Your physician recommends that you schedule a follow-up appointment in: 2 months with Dr. de [place].  You will receive a text message or e-mail with a link to a survey about your care and experience with [place] today! We would greatly appreciate your feedback!   Thanks for letting us be apart of your health journey!!  Primary Care and Sports Medicine   Dr. Arlina Robes [place]   We encourage you to activate your patient portal called "MyChart".  Sign up information is provided on this After Visit Summary.  MyChart is used to connect with patients for Virtual Visits (Telemedicine).  Patients are able to view lab/test results, encounter notes, upcoming appointments, etc.  Non-urgent messages can be sent to your provider as well. To learn more about what you can do with MyChart, please visit --  [email or web address].

## 2022-09-05 NOTE — Assessment & Plan Note (Signed)
Patient does have continued right knee pain.  She was able to start with physical therapy, has had 2 sessions thus far.  Has not noted significant change related to PT.  She indicates that she has also been set up to begin aquatic therapy.  Feels that she will have some swelling in time with right knee.  Generally, she feels that symptoms are about the same as last office visit. We reviewed recent x-ray imaging which does show evidence of osteoarthritis throughout the knee, most prevalent within medial compartment, patellofemoral compartment.  We discussed treatment options including continued physical therapy, home exercises.  We also discussed corticosteroid injection. At this time, she would like to continue with PT, aquatic therapy, home exercises.  She would prefer to hold off on steroid injection for the time being We will plan for follow-up in about 2 months to assess progress, but did advise that if she would like to return sooner due to increased symptoms or any other questions or concerns, she may return to office at any time If proceeding with steroid injection, will plan to do so as ultrasound-guided injection due to body habitus

## 2022-09-05 NOTE — Progress Notes (Signed)
    Procedures performed today:    None.  Independent interpretation of notes and tests performed by another provider:   None.  Brief History, Exam, Impression, and Recommendations:    BP (!) 143/87   Pulse 69   Temp 97.6 F (36.4 C) (Oral)   Ht '5\' 2"'$  (1.575 m)   Wt 292 lb 11.2 oz (132.8 kg)   SpO2 99%   BMI 53.54 kg/m   Right knee pain Patient does have continued right knee pain.  She was able to start with physical therapy, has had 2 sessions thus far.  Has not noted significant change related to PT.  She indicates that she has also been set up to begin aquatic therapy.  Feels that she will have some swelling in time with right knee.  Generally, she feels that symptoms are about the same as last office visit. We reviewed recent x-ray imaging which does show evidence of osteoarthritis throughout the knee, most prevalent within medial compartment, patellofemoral compartment.  We discussed treatment options including continued physical therapy, home exercises.  We also discussed corticosteroid injection. At this time, she would like to continue with PT, aquatic therapy, home exercises.  She would prefer to hold off on steroid injection for the time being We will plan for follow-up in about 2 months to assess progress, but did advise that if she would like to return sooner due to increased symptoms or any other questions or concerns, she may return to office at any time If proceeding with steroid injection, will plan to do so as ultrasound-guided injection due to body habitus  Chronic pain of both shoulders Patient also indicates that she has been having some bilateral shoulder pain.  She reports being told that she has had some issues with the rotator cuff in the past.  Pain is primarily worse with activity, currently she notes worsening with overhead movement, chest press exercise.  She denies any associated numbness or tingling.  No recent injury.  Both shoulders are painful, left is  worse than right.  She is right-handed On exam, she has mild tenderness to palpation over left bicipital groove, otherwise no significant tenderness to palpation about the shoulder.  Strength is slightly reduced, however largely equal bilaterally.  On right side, empty can and Neer's are painful.  On left side, empty can, Hawkins and Neer's are painful. Suspect some component of impingement syndrome, it is also possible there is some fraying/degeneration of the rotator cuff tendons.  Additional consideration is potential underlying osteoarthritis. At this time we will proceed with initial x-ray imaging as she has not had any imaging in the past We can also proceed with initial physical therapy, referral placed today  Return in about 2 months (around 11/05/2022), or if symptoms worsen or fail to improve.   ___________________________________________ Carla Saathoff de [place], MD, ABFM, CAQSM Primary Care and [place]

## 2022-09-12 ENCOUNTER — Encounter (HOSPITAL_BASED_OUTPATIENT_CLINIC_OR_DEPARTMENT_OTHER): Payer: Self-pay | Admitting: Physical Therapy

## 2022-09-12 ENCOUNTER — Ambulatory Visit (HOSPITAL_BASED_OUTPATIENT_CLINIC_OR_DEPARTMENT_OTHER): Payer: Medicaid Other | Admitting: Physical Therapy

## 2022-09-12 DIAGNOSIS — R2689 Other abnormalities of gait and mobility: Secondary | ICD-10-CM

## 2022-09-12 DIAGNOSIS — G8929 Other chronic pain: Secondary | ICD-10-CM

## 2022-09-12 DIAGNOSIS — M25561 Pain in right knee: Secondary | ICD-10-CM | POA: Diagnosis not present

## 2022-09-12 NOTE — Therapy (Signed)
OUTPATIENT PHYSICAL THERAPY LOWER EXTREMITY Treatment    Patient Name: Carla Hanson MRN: [id number] DOB:1966/02/12, 56 y.o., female Today's Date: 09/04/2022   PT End of Session - 09/04/22 1149     Visit Number 2    Number of Visits 12    Date for PT Re-Evaluation 10/10/22    PT Start Time 0930    PT Stop Time 1012    PT Time Calculation (min) 42 min    Activity Tolerance Patient tolerated treatment well    Behavior During Therapy [facility] for tasks assessed/performed              Past Medical History:  Diagnosis Date   Alcohol abuse    Anxiety    Back pain    Bipolar disorder ([place])    Bronchitis    COPD exacerbation ([place]) 05/10/2016   Drug use    History of blood clots    Hyperlipidemia    Hypertension    Influenza A 12/01/2018   Joint pain    Left leg DVT ([place]) 09/29/2013   Lipoma    Abdomen   LIPOMA 01/20/2008   Obesity    Occasional tremors 05/12/2019   Osteoarthritis of right knee    Other fatigue    Painful menstrual periods 01/05/2012   Pneumonia    Prediabetes    Seizure ([place])    Shortness of breath on exertion    Stroke ([place])    Tobacco abuse    Past Surgical History:  Procedure Laterality Date   CESAREAN SECTION     CYSTECTOMY     LIPOMA EXCISION  03/2011   ORIF ANKLE FRACTURE  03/13/2012   Procedure: OPEN REDUCTION INTERNAL FIXATION (ORIF) ANKLE FRACTURE;  Surgeon: Jessy Oto, MD;  Location: [facility];  Service: Orthopedics;  Laterality: Right;   TUBAL LIGATION     Patient Active Problem List   Diagnosis Date Noted   Right knee pain 07/18/2022   Other hyperlipidemia 05/15/2022   Seborrheic keratoses 02/26/2022   Allergic rhinitis 07/25/2021   Vitamin D deficiency 12/19/2020   Palpitation 12/03/2019   History of intracranial hemorrhage 08/02/2019   History of DVT of lower extremity 08/02/2019   History of seizure 08/02/2019   COPD, moderate ([place]) 12/11/2018   Lumbar back pain 02/11/2018   History of stroke 08/13/2017   Iron deficiency anemia  12/12/2016   Pre-diabetes 03/02/2013   History of pulmonary embolism 05/05/2012   Former tobacco use 08/23/2009   Hyperlipidemia 04/30/2007   Class 3 severe obesity with serious comorbidity and body mass index (BMI) of 50.0 to 59.9 in adult [facility]) 01/01/2007   Essential hypertension 01/01/2007    PCP: Dr Andrena Mews   REFERRING PROVIDER: Dr Debbra Riding   REFERRING DIAG: [phone] (ICD-10-CM) - Right knee pain, unspecified chronicity  THERAPY DIAG:  Chronic pain of right knee  Other abnormalities of gait and mobility  Rationale for Evaluation and Treatment Rehabilitation  ONSET DATE: long standing knee and shoulder pain   SUBJECTIVE:   SUBJECTIVE STATEMENT: Per patients note she is OK to see for PT for her shoulders. She has had a long history of shoulder pain L> R. She feels like it is better since she started working out her shoulders are better but they still flair up. She has most of her pain in the evenings. She has a lot of pain in the front of her shoulder. She has pain in the middle of the night when she lies on her shoulders.  PERTINENT  HISTORY: Anxiety, Low back pain, COPD, Blood Clots, OA of the right knee; Obesity, Left leg DVT; Seizures; Stroke Tobacco abuse   PAIN:  Are you having pain? Yes: NPRS scale: 6/10 today 11/1 Pain location: lateral knee  Pain description: aching Aggravating factors: pain gets worse as she exercises  Relieving factors: ice and rest   Are you having pain? Yes: NPRS scale: 5/10 at worst  Pain location: right shoulder pain and left is also achy  Pain description: aching Aggravating factors: had a recent severe exacerbation of her right shoulder pain  Relieving factors: ice and rest    PRECAUTIONS: None  WEIGHT BEARING RESTRICTIONS: No  FALLS:  Has patient fallen in last 6 months? No  LIVING ENVIRONMENT: Step up into the house  OCCUPATION:  Not working   Hobbies:  Walking; reading; going to the gym; going to church, music  and signing    PLOF: Independent  PATIENT GOALS:   To have less pain in her knees and shoulders    OBJECTIVE:   DIAGNOSTIC FINDINGS:  X-ray of the right knee:  IMPRESSION: Moderate to severe medial compartment and mild to moderate patellofemoral compartment osteoarthritis.   PATIENT SURVEYS:  FOTO    COGNITION: Overall cognitive status: Within functional limits for tasks assessed     SENSATION: WFL  EDEMA:    POSTURE: rounded shoulders and forward head Right knee valgus in standing   PALPATION: Limited patella motion   LOWER EXTREMITY ROM:  Active ROM Right eval Left eval  Hip flexion    Hip extension    Hip abduction    Hip adduction    Hip internal rotation    Hip external rotation    Knee flexion Pain at end range    Knee extension Mild extension limitation    Ankle dorsiflexion    Ankle plantarflexion    Ankle inversion    Ankle eversion     (Blank rows = not tested)  LOWER EXTREMITY MMT:  MMT Right eval Left eval  Hip flexion 34.5 24.7  Hip extension    Hip abduction 45.4 35.2  Hip adduction    Hip internal rotation    Hip external rotation    Knee flexion    Knee extension 38.9 43.3  Ankle dorsiflexion    Ankle plantarflexion    Ankle inversion    Ankle eversion     (Blank rows = not tested)  UPPER EXTREMITY MMT:  MMT Right eval Left eval  Shoulder flexion 4/5 3/5 Pain   Shoulder extension    Shoulder abduction    Shoulder adduction    Shoulder extension    Shoulder internal rotation 4/5 with pain  4/5 pain   Shoulder external rotation 4+/5 no pain  3+/5 pain   Middle trapezius    Lower trapezius    Elbow flexion    Elbow extension    Wrist flexion    Wrist extension    Wrist ulnar deviation    Wrist radial deviation    Wrist pronation    Wrist supination    Grip strength     (Blank rows = not tested)  Palpation: spasming of both upper traps  Tender to palpation in bipceps groove bilateral L> R   UPPER  EXTREMITY ROM:  Active ROM Right eval Left eval  Shoulder flexion 130 105 painful   Shoulder extension    Shoulder abduction 90  60 painful   Shoulder adduction    Shoulder extension    Shoulder internal  rotation To L2 without pain  To L3 without pain   Shoulder external rotation No pain  Minimal pain   Elbow flexion    Elbow extension    Wrist flexion    Wrist extension    Wrist ulnar deviation    Wrist radial deviation    Wrist pronation    Wrist supination     (Blank rows = not tested)  GAIT:  right knee valgus     TODAY'S TREATMENT                                                                          DATE: 11/9 Manual: Grade 1 and II PA and PA glides for pain; STM to posterior knee and lateral knee. Reviewed self soft tissue mobilization for home  Quad set 3x15   Evaled patients shoulders;   Wand flexion in limited pain free range 3x10   Band er 2x10 red bilateral  Rows 2x10 red reviewed technique  Shoulder extension 2x10   11/1 Manual: Grade 1 and II PA and PA glides for pain; STM to posterior knee and lateral knee. Reviewed self soft tissue mobilization for home   Quad set 3x15  SLR 3x10 fatigue towards the end SAQ 3x15   Heel raise x20  Standing slow march 2x15     Eval  Reviewed self patella mobility   Access Code: [email or web address] URL: [email or web address] Date: 08/29/2022 Prepared by: Carolyne Littles  Exercises - Supine Quad Set  - 1 x daily - 7 x weekly - 3 sets - 10 reps - Small Range Straight Leg Raise  - 1 x daily - 7 x weekly - 3 sets - 10 reps - 4 Way Patellar Glide  - 1 x daily - 7 x weekly - 3 sets - 10 reps  PATIENT EDUCATION:  Education details: EP, symptom management; patella mobility  Person educated: Patient Education method: Explanation, Demonstration, Tactile cues, Verbal cues, and Handouts Education comprehension: verbalized understanding, returned demonstration, verbal cues required, tactile cues required, and  needs further education  HOME EXERCISE PROGRAM: [email or web address]   ASSESSMENT:  CLINICAL IMPRESSION: The patient came I today following a workout in the gym. We focused on knee cap mobilization. We also performed a re-assessment on the patients shoulders. Signs and symptoms are consistent with impingement as noted by MD.  She also has significant tenderness on the left over the biceps tendon. She was given a base strength program. She would benefit from knee and shoulder program to improve her pain and function.   OBJECTIVE IMPAIRMENTS: Abnormal gait, decreased activity tolerance, difficulty walking, decreased ROM, decreased strength, and pain.   ACTIVITY LIMITATIONS: carrying, lifting, bending, sitting, standing, squatting, stairs, transfers, bathing, toileting, dressing, and locomotion level  PARTICIPATION LIMITATIONS: meal prep, cleaning, laundry, shopping, community activity, and yard work  PERSONAL FACTORS: 3+ comorbidities: Anxiety, Low back pain, COPD, Blood Clots, OA of the right knee; Obesity, Left leg DVT; Seizures; Stroke Tobacco abuse  are also affecting patient's functional outcome.   REHAB POTENTIAL: Good  CLINICAL DECISION MAKING: Evolving/moderate complexity increasing pain with activity   EVALUATION COMPLEXITY: Moderate   GOALS: Goals reviewed with patient? Yes  SHORT TERM GOALS: Target date: 09/19/2022  Patient will increase left leg  gross strength by 5 lbs  Baseline: Goal status: INITIAL  2.  Patient will demonstrate improved patella motion and less popping  Baseline:  Goal status: INITIAL  3.  Patient will be be able to modify her program when he knee OA flares up  Baseline:  Goal status: INITIAL   LONG TERM GOALS: Target date: 10/10/2022   Patient will return to a full gym program without significant pain  Baseline:  Goal status: INITIAL  2.  Patient will have a full pool program  Baseline:  Goal status: INITIAL  3.  Patient will demonstrate equa  strength left and right in order to walk in the community without increased stresss on the right leg.  Baseline:  Goal status: INITIAL    PLAN:  PT FREQUENCY: 1-2x/week  PT DURATION: 6 weeks  PLANNED INTERVENTIONS: Therapeutic exercises, Therapeutic activity, Neuromuscular re-education, Balance training, Gait training, Patient/Family education, Self Care, Joint mobilization, Stair training, Aquatic Therapy, Dry Needling, Cryotherapy, Moist heat, Taping, Ultrasound, and Manual therapy  PLAN FOR NEXT SESSION: continue with patella motion; reviewe ome strengthening. Add in bridging. Consider taping. In the pool review gait training and hip strengthening. Review gym exercises for the knee and shoulders.    Carney Living, PT 09/04/2022, 11:50 AM

## 2022-09-17 NOTE — Progress Notes (Unsigned)
TeleHealth Visit:  This visit was completed with telemedicine (audio/video) technology. Carla Hanson has verbally consented to this TeleHealth visit. The patient is located at home, the provider is located at home. The participants in this visit include the listed provider and patient. The visit was conducted today via MyChart video.  OBESITY Carla Hanson is here to discuss her progress with her obesity treatment plan along with follow-up of her obesity related diagnoses.   Today's visit was # 32 Starting weight: 324 lbs Starting date: 01/03/2021 Weight at last in office visit: 292 lbs on 07/22/22 Total weight loss: 32 lbs at last in office visit on 07/22/22. Today's reported weight: *** lbs No weight reported.  Nutrition Plan: following a lower carbohydrate, vegetable and lean protein rich diet plan.   Current exercise: {exercise types:16438} none  Interim History: ***  Assessment/Plan:  1. ***  2. ***  3. ***  Obesity: Current BMI *** Carla Hanson {CHL AMB IS/IS NOT:210130109} currently in the action stage of change. As such, her goal is to {MWMwtloss#1:210800005}.  She has agreed to {MWMwtlossportion/plan2:23431}.   Exercise goals: {MWM EXERCISE RECS:23473}  Behavioral modification strategies: {MWMwtlossdietstrategies3:23432}.  Carla Hanson has agreed to follow-up with our clinic in {NUMBER 1-10:22536} weeks.   No orders of the defined types were placed in this encounter.   There are no discontinued medications.   No orders of the defined types were placed in this encounter.     Objective:   VITALS: Per patient if applicable, see vitals. GENERAL: Alert and in no acute distress. CARDIOPULMONARY: No increased WOB. Speaking in clear sentences.  PSYCH: Pleasant and cooperative. Speech normal rate and rhythm. Affect is appropriate. Insight and judgement are appropriate. Attention is focused, linear, and appropriate.  NEURO: Oriented as arrived to appointment on time with no prompting.    Lab Results  Component Value Date   CREATININE 0.55 (L) 07/22/2022   BUN 12 07/22/2022   NA 141 07/22/2022   K 3.9 07/22/2022   CL 101 07/22/2022   CO2 24 07/22/2022   Lab Results  Component Value Date   ALT 17 07/22/2022   AST 16 07/22/2022   ALKPHOS 48 07/22/2022   BILITOT 0.4 07/22/2022   Lab Results  Component Value Date   HGBA1C 6.3 (H) 07/22/2022   HGBA1C 6.1 (H) 03/04/2022   HGBA1C 6.2 (H) 11/20/2021   HGBA1C 6.3 (H) 05/21/2021   HGBA1C 6.4 (A) 12/19/2020   Lab Results  Component Value Date   INSULIN 62.0 (H) 07/22/2022   INSULIN 81.0 (H) 03/04/2022   INSULIN 60.8 (H) 11/20/2021   INSULIN 64.3 (H) 05/21/2021   INSULIN 77.3 (H) 01/03/2021   Lab Results  Component Value Date   TSH 1.630 07/22/2022   Lab Results  Component Value Date   CHOL 147 07/22/2022   HDL 50 07/22/2022   LDLCALC 78 07/22/2022   TRIG 104 07/22/2022   CHOLHDL 5.7 (H) 05/15/2021   Lab Results  Component Value Date   WBC 7.1 07/22/2022   HGB 12.7 07/22/2022   HCT 39.9 07/22/2022   MCV 81 07/22/2022   PLT 314 07/22/2022   Lab Results  Component Value Date   IRON 32 12/03/2016   TIBC 363 12/03/2016   FERRITIN 13 (L) 02/09/2018   Lab Results  Component Value Date   VD25OH 44.6 07/22/2022   VD25OH 57.1 03/04/2022   VD25OH 72.8 11/20/2021    Attestation Statements:   Reviewed by clinician on day of visit: allergies, medications, problem list, medical history, surgical history, family history,  social history, and previous encounter notes.  ***(delete if time-based billing not used) Time spent on visit including the items listed below was *** minutes.  -preparing to see the patient (e.g., review of tests, history, previous notes) -obtaining and/or reviewing separately obtained history -counseling and educating the patient/family/caregiver -documenting clinical information in the electronic or other health record -ordering medications, tests, or procedures -independently  interpreting results and communicating results to the patient/ family/caregiver -referring and communicating with other health care professionals  -care coordination

## 2022-09-18 ENCOUNTER — Encounter (INDEPENDENT_AMBULATORY_CARE_PROVIDER_SITE_OTHER): Payer: Self-pay | Admitting: Family Medicine

## 2022-09-18 ENCOUNTER — Telehealth (INDEPENDENT_AMBULATORY_CARE_PROVIDER_SITE_OTHER): Payer: Medicaid Other | Admitting: Family Medicine

## 2022-09-18 DIAGNOSIS — E559 Vitamin D deficiency, unspecified: Secondary | ICD-10-CM

## 2022-09-18 DIAGNOSIS — R7303 Prediabetes: Secondary | ICD-10-CM

## 2022-09-18 DIAGNOSIS — E669 Obesity, unspecified: Secondary | ICD-10-CM | POA: Diagnosis not present

## 2022-09-18 DIAGNOSIS — Z6841 Body Mass Index (BMI) 40.0 and over, adult: Secondary | ICD-10-CM | POA: Diagnosis not present

## 2022-09-18 MED ORDER — METFORMIN HCL 500 MG PO TABS: 500.0000 mg | ORAL_TABLET | Freq: Three times a day (TID) | ORAL | 0 refills | Status: DC

## 2022-09-19 ENCOUNTER — Encounter (HOSPITAL_BASED_OUTPATIENT_CLINIC_OR_DEPARTMENT_OTHER): Payer: Self-pay | Admitting: Physical Therapy

## 2022-09-19 ENCOUNTER — Ambulatory Visit (HOSPITAL_BASED_OUTPATIENT_CLINIC_OR_DEPARTMENT_OTHER): Payer: Medicaid Other | Admitting: Physical Therapy

## 2022-09-19 DIAGNOSIS — R2689 Other abnormalities of gait and mobility: Secondary | ICD-10-CM

## 2022-09-19 DIAGNOSIS — G8929 Other chronic pain: Secondary | ICD-10-CM

## 2022-09-19 DIAGNOSIS — M25561 Pain in right knee: Secondary | ICD-10-CM | POA: Diagnosis not present

## 2022-09-19 NOTE — Therapy (Signed)
OUTPATIENT PHYSICAL THERAPY LOWER EXTREMITY Treatment    Patient Name: Carla Hanson MRN: [id number] DOB:07-Mar-1966, 56 y.o., female Today's Date: 09/04/2022   PT End of Session - 09/04/22 1149     Visit Number 2    Number of Visits 12    Date for PT Re-Evaluation 10/10/22    PT Start Time 0930    PT Stop Time 1012    PT Time Calculation (min) 42 min    Activity Tolerance Patient tolerated treatment well    Behavior During Therapy [facility] for tasks assessed/performed              Past Medical History:  Diagnosis Date   Alcohol abuse    Anxiety    Back pain    Bipolar disorder ([place])    Bronchitis    COPD exacerbation ([place]) 05/10/2016   Drug use    History of blood clots    Hyperlipidemia    Hypertension    Influenza A 12/01/2018   Joint pain    Left leg DVT ([place]) 09/29/2013   Lipoma    Abdomen   LIPOMA 01/20/2008   Obesity    Occasional tremors 05/12/2019   Osteoarthritis of right knee    Other fatigue    Painful menstrual periods 01/05/2012   Pneumonia    Prediabetes    Seizure ([place])    Shortness of breath on exertion    Stroke ([place])    Tobacco abuse    Past Surgical History:  Procedure Laterality Date   CESAREAN SECTION     CYSTECTOMY     LIPOMA EXCISION  03/2011   ORIF ANKLE FRACTURE  03/13/2012   Procedure: OPEN REDUCTION INTERNAL FIXATION (ORIF) ANKLE FRACTURE;  Surgeon: Jessy Oto, MD;  Location: [facility];  Service: Orthopedics;  Laterality: Right;   TUBAL LIGATION     Patient Active Problem List   Diagnosis Date Noted   Right knee pain 07/18/2022   Other hyperlipidemia 05/15/2022   Seborrheic keratoses 02/26/2022   Allergic rhinitis 07/25/2021   Vitamin D deficiency 12/19/2020   Palpitation 12/03/2019   History of intracranial hemorrhage 08/02/2019   History of DVT of lower extremity 08/02/2019   History of seizure 08/02/2019   COPD, moderate ([place]) 12/11/2018   Lumbar back pain 02/11/2018   History of stroke 08/13/2017   Iron deficiency anemia  12/12/2016   Pre-diabetes 03/02/2013   History of pulmonary embolism 05/05/2012   Former tobacco use 08/23/2009   Hyperlipidemia 04/30/2007   Class 3 severe obesity with serious comorbidity and body mass index (BMI) of 50.0 to 59.9 in adult [facility]) 01/01/2007   Essential hypertension 01/01/2007    PCP: Dr Andrena Mews   REFERRING PROVIDER: Dr Debbra Riding   REFERRING DIAG: [id number] (ICD-10-CM) - Right knee pain, unspecified chronicity  THERAPY DIAG:  Chronic pain of right knee  Other abnormalities of gait and mobility  Rationale for Evaluation and Treatment Rehabilitation  ONSET DATE: long standing knee and shoulder pain   SUBJECTIVE:   SUBJECTIVE STATEMENT: Patient reports she is generally sore today. She went to a luncheon yesterday and did a lot of walking. She feels like her shoulders are better today.    PERTINENT HISTORY: Anxiety, Low back pain, COPD, Blood Clots, OA of the right knee; Obesity, Left leg DVT; Seizures; Stroke Tobacco abuse   PAIN:  Are you having pain? Yes: NPRS scale: 6/10 today 11/1 Pain location: lateral knee  Pain description: aching Aggravating factors: pain gets worse as she exercises  Relieving factors: ice and rest   Are you having pain? Yes: NPRS scale: 5/10 at worst  Pain location: right shoulder pain and left is also achy  Pain description: aching Aggravating factors: had a recent severe exacerbation of her right shoulder pain  Relieving factors: ice and rest    PRECAUTIONS: None  WEIGHT BEARING RESTRICTIONS: No  FALLS:  Has patient fallen in last 6 months? No  LIVING ENVIRONMENT: Step up into the house  OCCUPATION:  Not working   Hobbies:  Walking; reading; going to the gym; going to church, music and signing    PLOF: Independent  PATIENT GOALS:   To have less pain in her knees and shoulders    OBJECTIVE:   DIAGNOSTIC FINDINGS:  X-ray of the right knee:  IMPRESSION: Moderate to severe medial compartment and  mild to moderate patellofemoral compartment osteoarthritis.   PATIENT SURVEYS:  FOTO    COGNITION: Overall cognitive status: Within functional limits for tasks assessed     SENSATION: WFL  EDEMA:    POSTURE: rounded shoulders and forward head Right knee valgus in standing   PALPATION: Limited patella motion   LOWER EXTREMITY ROM:  Active ROM Right eval Left eval  Hip flexion    Hip extension    Hip abduction    Hip adduction    Hip internal rotation    Hip external rotation    Knee flexion Pain at end range    Knee extension Mild extension limitation    Ankle dorsiflexion    Ankle plantarflexion    Ankle inversion    Ankle eversion     (Blank rows = not tested)  LOWER EXTREMITY MMT:  MMT Right eval Left eval  Hip flexion 34.5 24.7  Hip extension    Hip abduction 45.4 35.2  Hip adduction    Hip internal rotation    Hip external rotation    Knee flexion    Knee extension 38.9 43.3  Ankle dorsiflexion    Ankle plantarflexion    Ankle inversion    Ankle eversion     (Blank rows = not tested)  UPPER EXTREMITY MMT:  MMT Right eval Left eval  Shoulder flexion 4/5 3/5 Pain   Shoulder extension    Shoulder abduction    Shoulder adduction    Shoulder extension    Shoulder internal rotation 4/5 with pain  4/5 pain   Shoulder external rotation 4+/5 no pain  3+/5 pain   Middle trapezius    Lower trapezius    Elbow flexion    Elbow extension    Wrist flexion    Wrist extension    Wrist ulnar deviation    Wrist radial deviation    Wrist pronation    Wrist supination    Grip strength     (Blank rows = not tested)  Palpation: spasming of both upper traps  Tender to palpation in bipceps groove bilateral L> R   UPPER EXTREMITY ROM:  Active ROM Right eval Left eval  Shoulder flexion 130 105 painful   Shoulder extension    Shoulder abduction 90  60 painful   Shoulder adduction    Shoulder extension    Shoulder internal rotation To L2  without pain  To L3 without pain   Shoulder external rotation No pain  Minimal pain   Elbow flexion    Elbow extension    Wrist flexion    Wrist extension    Wrist ulnar deviation    Wrist radial deviation  Wrist pronation    Wrist supination     (Blank rows = not tested)  GAIT:  right knee valgus     TODAY'S TREATMENT                                                                          DATE:      11/16 Leg press 25 3x15 Hip abduction machine 3x15  Tips given for adjusting sets and reps depending on soreness levels.   1# [employer] Leg Press 15 lbs 2x15  #6 [employer] Hip Abduction 30 2x15    12# [employer] row 15 lbs 2x15  15# [employer] triceps press down 30 lbs 2x15   Cable Rows 10 lbs 2x10  Cable Extension 10 lbs  2x10     11/9 Manual: Grade 1 and II PA and PA glides for pain; STM to posterior knee and lateral knee. Reviewed self soft tissue mobilization for home  Quad set 3x15   Evaled patients shoulders;   Wand flexion in limited pain free range 3x10   Band er 2x10 red bilateral  Rows 2x10 red reviewed technique  Shoulder extension 2x10   11/1 Manual: Grade 1 and II PA and PA glides for pain; STM to posterior knee and lateral knee. Reviewed self soft tissue mobilization for home   Quad set 3x15  SLR 3x10 fatigue towards the end SAQ 3x15   Heel raise x20  Standing slow march 2x15      PATIENT EDUCATION:  Education details: EP, symptom management; patella mobility  Person educated: Patient Education method: Explanation, Demonstration, Tactile cues, Verbal cues, and Handouts Education comprehension: verbalized understanding, returned demonstration, verbal cues required, tactile cues required, and needs further education  HOME EXERCISE PROGRAM: [email or web address]   ASSESSMENT:  CLINICAL IMPRESSION: Therapy reviewed gym exercises with the patient today. She tolerated well. She is doing a lot of the exercises already but we reviewed  technique and adjusting there exercises according to her pain level. She had not tried the triceps extension machine or cable extension. She did well with those exercises. She will transition to the pool for a few visits. We may follow up for one more land appoinment at the end.  OBJECTIVE IMPAIRMENTS: Abnormal gait, decreased activity tolerance, difficulty walking, decreased ROM, decreased strength, and pain.   ACTIVITY LIMITATIONS: carrying, lifting, bending, sitting, standing, squatting, stairs, transfers, bathing, toileting, dressing, and locomotion level  PARTICIPATION LIMITATIONS: meal prep, cleaning, laundry, shopping, community activity, and yard work  PERSONAL FACTORS: 3+ comorbidities: Anxiety, Low back pain, COPD, Blood Clots, OA of the right knee; Obesity, Left leg DVT; Seizures; Stroke Tobacco abuse  are also affecting patient's functional outcome.   REHAB POTENTIAL: Good  CLINICAL DECISION MAKING: Evolving/moderate complexity increasing pain with activity   EVALUATION COMPLEXITY: Moderate   GOALS: Goals reviewed with patient? Yes  SHORT TERM GOALS: Target date: 09/19/2022  Patient will increase left leg gross strength by 5 lbs  Baseline: Goal status: INITIAL  2.  Patient will demonstrate improved patella motion and less popping  Baseline:  Goal status: INITIAL  3.  Patient will be be able to modify her program when he knee OA flares up  Baseline:  Goal status: INITIAL  LONG TERM GOALS: Target date: 10/10/2022   Patient will return to a full gym program without significant pain  Baseline:  Goal status: INITIAL  2.  Patient will have a full pool program  Baseline:  Goal status: INITIAL  3.  Patient will demonstrate equa strength left and right in order to walk in the community without increased stresss on the right leg.  Baseline:  Goal status: INITIAL    PLAN:  PT FREQUENCY: 1-2x/week  PT DURATION: 6 weeks  PLANNED INTERVENTIONS: Therapeutic  exercises, Therapeutic activity, Neuromuscular re-education, Balance training, Gait training, Patient/Family education, Self Care, Joint mobilization, Stair training, Aquatic Therapy, Dry Needling, Cryotherapy, Moist heat, Taping, Ultrasound, and Manual therapy  PLAN FOR NEXT SESSION: continue with patella motion; reviewe ome strengthening. Add in bridging. Consider taping. In the pool review gait training and hip strengthening. Review gym exercises for the knee and shoulders.    Carney Living, PT 09/04/2022, 11:50 AM

## 2022-09-24 ENCOUNTER — Ambulatory Visit (HOSPITAL_BASED_OUTPATIENT_CLINIC_OR_DEPARTMENT_OTHER): Payer: Medicaid Other | Admitting: Physical Therapy

## 2022-09-24 ENCOUNTER — Encounter (HOSPITAL_BASED_OUTPATIENT_CLINIC_OR_DEPARTMENT_OTHER): Payer: Self-pay | Admitting: Physical Therapy

## 2022-09-24 DIAGNOSIS — M25561 Pain in right knee: Secondary | ICD-10-CM | POA: Diagnosis not present

## 2022-09-24 DIAGNOSIS — R2689 Other abnormalities of gait and mobility: Secondary | ICD-10-CM

## 2022-09-24 DIAGNOSIS — G8929 Other chronic pain: Secondary | ICD-10-CM

## 2022-09-24 NOTE — Therapy (Signed)
OUTPATIENT PHYSICAL THERAPY LOWER EXTREMITY Treatment    Patient Name: Carla Hanson MRN: [id number] DOB:27-Jun-1966, 56 y.o., female Today's Date: 09/04/2022   PT End of Session - 09/04/22 1149     Visit Number 2    Number of Visits 12    Date for PT Re-Evaluation 10/10/22    PT Start Time 0930    PT Stop Time 1012    PT Time Calculation (min) 42 min    Activity Tolerance Patient tolerated treatment well    Behavior During Therapy [facility] for tasks assessed/performed              Past Medical History:  Diagnosis Date   Alcohol abuse    Anxiety    Back pain    Bipolar disorder ([place])    Bronchitis    COPD exacerbation ([place]) 05/10/2016   Drug use    History of blood clots    Hyperlipidemia    Hypertension    Influenza A 12/01/2018   Joint pain    Left leg DVT ([place]) 09/29/2013   Lipoma    Abdomen   LIPOMA 01/20/2008   Obesity    Occasional tremors 05/12/2019   Osteoarthritis of right knee    Other fatigue    Painful menstrual periods 01/05/2012   Pneumonia    Prediabetes    Seizure ([place])    Shortness of breath on exertion    Stroke ([place])    Tobacco abuse    Past Surgical History:  Procedure Laterality Date   CESAREAN SECTION     CYSTECTOMY     LIPOMA EXCISION  03/2011   ORIF ANKLE FRACTURE  03/13/2012   Procedure: OPEN REDUCTION INTERNAL FIXATION (ORIF) ANKLE FRACTURE;  Surgeon: Jessy Oto, MD;  Location: [facility];  Service: Orthopedics;  Laterality: Right;   TUBAL LIGATION     Patient Active Problem List   Diagnosis Date Noted   Right knee pain 07/18/2022   Other hyperlipidemia 05/15/2022   Seborrheic keratoses 02/26/2022   Allergic rhinitis 07/25/2021   Vitamin D deficiency 12/19/2020   Palpitation 12/03/2019   History of intracranial hemorrhage 08/02/2019   History of DVT of lower extremity 08/02/2019   History of seizure 08/02/2019   COPD, moderate ([place]) 12/11/2018   Lumbar back pain 02/11/2018   History of stroke 08/13/2017   Iron deficiency anemia  12/12/2016   Pre-diabetes 03/02/2013   History of pulmonary embolism 05/05/2012   Former tobacco use 08/23/2009   Hyperlipidemia 04/30/2007   Class 3 severe obesity with serious comorbidity and body mass index (BMI) of 50.0 to 59.9 in adult [facility]) 01/01/2007   Essential hypertension 01/01/2007    PCP: Dr Andrena Mews   REFERRING PROVIDER: Dr Debbra Riding   REFERRING DIAG: [id number] (ICD-10-CM) - Right knee pain, unspecified chronicity  THERAPY DIAG:  Chronic pain of right knee  Other abnormalities of gait and mobility  Rationale for Evaluation and Treatment Rehabilitation  ONSET DATE: long standing knee and shoulder pain   SUBJECTIVE:   SUBJECTIVE STATEMENT: Patient reports some increase in knee pain maybe due to change in weather.    PERTINENT HISTORY: Anxiety, Low back pain, COPD, Blood Clots, OA of the right knee; Obesity, Left leg DVT; Seizures; Stroke Tobacco abuse   PAIN:  Are you having pain? Yes: NPRS scale: 6/10 today 11/21 Pain location: lateral knee  Pain description: aching Aggravating factors: pain gets worse as she exercises  Relieving factors: ice and rest   Are you having pain? Yes: NPRS  scale: 4/10 at worst  Pain location: right shoulder pain and left is also achy  Pain description: aching Aggravating factors: had a recent severe exacerbation of her right shoulder pain  Relieving factors: ice and rest    PRECAUTIONS: None  WEIGHT BEARING RESTRICTIONS: No  FALLS:  Has patient fallen in last 6 months? No  LIVING ENVIRONMENT: Step up into the house  OCCUPATION:  Not working   Hobbies:  Walking; reading; going to the gym; going to church, music and signing    PLOF: Independent  PATIENT GOALS:   To have less pain in her knees and shoulders    OBJECTIVE:   DIAGNOSTIC FINDINGS:  X-ray of the right knee:  IMPRESSION: Moderate to severe medial compartment and mild to moderate patellofemoral compartment osteoarthritis.   PATIENT  SURVEYS:  FOTO    COGNITION: Overall cognitive status: Within functional limits for tasks assessed     SENSATION: WFL  EDEMA:    POSTURE: rounded shoulders and forward head Right knee valgus in standing   PALPATION: Limited patella motion   LOWER EXTREMITY ROM:  Active ROM Right eval Left eval  Hip flexion    Hip extension    Hip abduction    Hip adduction    Hip internal rotation    Hip external rotation    Knee flexion Pain at end range    Knee extension Mild extension limitation    Ankle dorsiflexion    Ankle plantarflexion    Ankle inversion    Ankle eversion     (Blank rows = not tested)  LOWER EXTREMITY MMT:  MMT Right eval Left eval  Hip flexion 34.5 24.7  Hip extension    Hip abduction 45.4 35.2  Hip adduction    Hip internal rotation    Hip external rotation    Knee flexion    Knee extension 38.9 43.3  Ankle dorsiflexion    Ankle plantarflexion    Ankle inversion    Ankle eversion     (Blank rows = not tested)  UPPER EXTREMITY MMT:  MMT Right eval Left eval  Shoulder flexion 4/5 3/5 Pain   Shoulder extension    Shoulder abduction    Shoulder adduction    Shoulder extension    Shoulder internal rotation 4/5 with pain  4/5 pain   Shoulder external rotation 4+/5 no pain  3+/5 pain   Middle trapezius    Lower trapezius    Elbow flexion    Elbow extension    Wrist flexion    Wrist extension    Wrist ulnar deviation    Wrist radial deviation    Wrist pronation    Wrist supination    Grip strength     (Blank rows = not tested)  Palpation: spasming of both upper traps  Tender to palpation in bipceps groove bilateral L> R   UPPER EXTREMITY ROM:  Active ROM Right eval Left eval  Shoulder flexion 130 105 painful   Shoulder extension    Shoulder abduction 90  60 painful   Shoulder adduction    Shoulder extension    Shoulder internal rotation To L2 without pain  To L3 without pain   Shoulder external rotation No pain  Minimal  pain   Elbow flexion    Elbow extension    Wrist flexion    Wrist extension    Wrist ulnar deviation    Wrist radial deviation    Wrist pronation    Wrist supination     (  Blank rows = not tested)  GAIT:  right knee valgus     TODAY'S TREATMENT                                                                          DATE:  09/24/22 Pt seen for aquatic therapy today.  Treatment took place in water 3.25-4.5 ft in depth at the [facility]. Temp of water was 92.  Pt entered/exited the pool via stair with hand rail.  UE unsupported walking forward, back and side stepping Standing ue supported on yellow hand buoys: squats; df; pf;hip add/abd; hip flex; hip ext; hip flex/ext x 10 -Step ups bottom step leading R/L x 10 Seated on bench: cycling; LAQ 2x10; add/abd 2x20; STS onto water step x10 w/o ue support x 10 Cycling on noodle UE support on corner wall for safety  Pt requires the buoyancy and hydrostatic pressure of water for support, and to offload joints by unweighting joint load by at least 50 % in navel deep water and by at least 75-80% in chest to neck deep water.  Viscosity of the water is needed for resistance of strengthening. Water current perturbations provides challenge to standing balance requiring increased core activation.     11/16 Leg press 25 3x15 Hip abduction machine 3x15  Tips given for adjusting sets and reps depending on soreness levels.   1# [employer] Leg Press 15 lbs 2x15  #6 [employer] Hip Abduction 30 2x15    12# [employer] row 15 lbs 2x15  15# [employer] triceps press down 30 lbs 2x15   Cable Rows 10 lbs 2x10  Cable Extension 10 lbs  2x10     11/9 Manual: Grade 1 and II PA and PA glides for pain; STM to posterior knee and lateral knee. Reviewed self soft tissue mobilization for home  Quad set 3x15   Evaled patients shoulders;   Wand flexion in limited pain free range 3x10   Band er 2x10 red bilateral  Rows 2x10 red  reviewed technique  Shoulder extension 2x10   11/1 Manual: Grade 1 and II PA and PA glides for pain; STM to posterior knee and lateral knee. Reviewed self soft tissue mobilization for home   Quad set 3x15  SLR 3x10 fatigue towards the end SAQ 3x15   Heel raise x20  Standing slow march 2x15      PATIENT EDUCATION:  Education details: EP, symptom management; patella mobility  Person educated: Patient Education method: Explanation, Demonstration, Tactile cues, Verbal cues, and Handouts Education comprehension: verbalized understanding, returned demonstration, verbal cues required, tactile cues required, and needs further education  HOME EXERCISE PROGRAM: [email or web address]   ASSESSMENT:  CLINICAL IMPRESSION: Pt demonstrates indep in setting with therapist instructing from deck. She is directed through LE exercises and balance challenges with added aerobic capacity component as able. She requires VC and demonstration throughout for proper execution. She has difficulty maintaining sitting balance on noodle with hand buoys for support, required support of wall. She tolerates session well with reduction in knee pain to 0/10.  Some left shoulder  discomfort continued. She is a good candidate for aquatic therapy benefiting from the properties of water to improve strength, ROM and decrease pain improving  functional mobility   OBJECTIVE IMPAIRMENTS: Abnormal gait, decreased activity tolerance, difficulty walking, decreased ROM, decreased strength, and pain.   ACTIVITY LIMITATIONS: carrying, lifting, bending, sitting, standing, squatting, stairs, transfers, bathing, toileting, dressing, and locomotion level  PARTICIPATION LIMITATIONS: meal prep, cleaning, laundry, shopping, community activity, and yard work  PERSONAL FACTORS: 3+ comorbidities: Anxiety, Low back pain, COPD, Blood Clots, OA of the right knee; Obesity, Left leg DVT; Seizures; Stroke Tobacco abuse  are also affecting patient's functional  outcome.   REHAB POTENTIAL: Good  CLINICAL DECISION MAKING: Evolving/moderate complexity increasing pain with activity   EVALUATION COMPLEXITY: Moderate   GOALS: Goals reviewed with patient? Yes  SHORT TERM GOALS: Target date: 09/19/2022  Patient will increase left leg gross strength by 5 lbs  Baseline: Goal status: INITIAL  2.  Patient will demonstrate improved patella motion and less popping  Baseline:  Goal status: INITIAL  3.  Patient will be be able to modify her program when he knee OA flares up  Baseline:  Goal status: INITIAL   LONG TERM GOALS: Target date: 10/10/2022   Patient will return to a full gym program without significant pain  Baseline:  Goal status: INITIAL  2.  Patient will have a full pool program  Baseline:  Goal status: INITIAL  3.  Patient will demonstrate equa strength left and right in order to walk in the community without increased stresss on the right leg.  Baseline:  Goal status: INITIAL    PLAN:  PT FREQUENCY: 1-2x/week  PT DURATION: 6 weeks  PLANNED INTERVENTIONS: Therapeutic exercises, Therapeutic activity, Neuromuscular re-education, Balance training, Gait training, Patient/Family education, Self Care, Joint mobilization, Stair training, Aquatic Therapy, Dry Needling, Cryotherapy, Moist heat, Taping, Ultrasound, and Manual therapy  PLAN FOR NEXT SESSION: continue with patella motion; reviewe ome strengthening. Add in bridging. Consider taping. In the pool review gait training and hip strengthening. Review gym exercises for the knee and shoulders.    Stanton Kidney Tharon Aquas) Minda Faas MPT 09/24/22 137 PM

## 2022-10-01 ENCOUNTER — Ambulatory Visit (HOSPITAL_BASED_OUTPATIENT_CLINIC_OR_DEPARTMENT_OTHER): Payer: Medicaid Other | Admitting: Physical Therapy

## 2022-10-01 NOTE — Progress Notes (Unsigned)
TeleHealth Visit:  This visit was completed with telemedicine (audio/video) technology. Carla Hanson has verbally consented to this TeleHealth visit. The patient is located at home, the provider is located at home. The participants in this visit include the listed provider and patient. The visit was conducted today via MyChart video.  OBESITY Carla Hanson is here to discuss her progress with her obesity treatment plan along with follow-up of her obesity related diagnoses.   Today's visit was # 33 Starting weight: 324 lbs Starting date: 01/03/2021 Weight at last in office visit: 292 lbs on 07/22/22 Total weight loss: 32 lbs at last in office visit on 07/22/22. Today's reported weight: 291 lbs   Nutrition Plan: following a lower carbohydrate, vegetable and lean protein rich diet plan.   Current exercise:  Cardio/weights for 60 minutes 4-5 days per week  Interim History: Carla Hanson is doing well on the low-carb plan.  She does deviate in that she has low-fat dairy and a little bit of fruit.  She has a homemade protein smoothie for breakfast with 20 to 25 g of protein.  Usually eats 2 meals per day and then has protein snacks.  She generally averages 70 to 80 g of protein per day.  Appetite has been low. She has been more cognizant of when she is hungry vs when she is bored. Water intake is good. Exercise is very consistent.  Assessment/Plan:  1.  White coat syndrome with diagnosis of hypertension Hypertension borderline controlled.  Medication(s): HCTZ 25 mg daily, losartan 50 mg daily  BP Readings from Last 3 Encounters:  09/05/22 (!) 143/87  07/22/22 (!) 157/83  07/18/22 (!) 140/53   Lab Results  Component Value Date   CREATININE 0.55 (L) 07/22/2022   CREATININE 0.60 03/04/2022   CREATININE 0.66 11/20/2021    Plan: Discuss home blood pressure values at next office visit. Refill HCTZ 25 mg daily Refill losartan 50 mg daily   2. Prediabetes Last A1c was 6.3 on 07/22/2022.  On  metformin 500 mg 3 times a day and tolerating well.  Lab Results  Component Value Date   HGBA1C 6.3 (H) 07/22/2022   Lab Results  Component Value Date   INSULIN 62.0 (H) 07/22/2022   INSULIN 81.0 (H) 03/04/2022   INSULIN 60.8 (H) 11/20/2021   INSULIN 64.3 (H) 05/21/2021   INSULIN 77.3 (H) 01/03/2021    Plan: Refill metformin 500 mg 3 times daily with meals Continue low-carb meal plan  3. Vitamin D Deficiency Vitamin D level nearly at goal of 50.  Last vitamin D was 44.6 on 07/22/2022. She is on  prescription Vitamin D 50,000 IU every 14 days which she purchases over-the-counter. Lab Results  Component Value Date   VD25OH 44.6 07/22/2022   VD25OH 57.1 03/04/2022   VD25OH 72.8 11/20/2021    Plan: Continue OTC vitamin D 50,000 IU weekly.   4. Obesity: Current BMI 53.4 Carla Hanson is currently in the action stage of change. As such, her goal is to continue with weight loss efforts.  She has agreed to practicing portion control and making smarter food choices, such as increasing vegetables and decreasing simple carbohydrates.   Track protein-goal is 80 g/day. Weight at office before next visit.  Exercise goals: as is  Behavioral modification strategies: increasing lean protein intake and decreasing simple carbohydrates.  Carla Hanson has agreed to follow-up with our clinic in 3 weeks.   No orders of the defined types were placed in this encounter.   Medications Discontinued During This Encounter  Medication Reason  hydrochlorothiazide (HYDRODIURIL) 25 MG tablet Reorder   losartan (COZAAR) 50 MG tablet Reorder   metFORMIN (GLUCOPHAGE) 500 MG tablet Reorder   Vitamin D, Ergocalciferol, (DRISDOL) 1.25 MG (50000 UNIT) CAPS capsule Discontinued by provider     Meds ordered this encounter  Medications   hydrochlorothiazide (HYDRODIURIL) 25 MG tablet    Sig: Take 1 tablet (25 mg total) by mouth daily.    Dispense:  90 tablet    Refill:  0    Order Specific Question:    Supervising Provider    Answer:   Dell Ponto [[id number]]   losartan (COZAAR) 50 MG tablet    Sig: TAKE 1 TABLET(50 MG) BY MOUTH DAILY    Dispense:  90 tablet    Refill:  0    Order Specific Question:   Supervising Provider    Answer:   Netty Starring   metFORMIN (GLUCOPHAGE) 500 MG tablet    Sig: Take 1 tablet (500 mg total) by mouth 3 (three) times daily with meals.    Dispense:  90 tablet    Refill:  0    Order Specific Question:   Supervising Provider    Answer:   Loyal Gambler E [[id number]]   Vitamin D, Ergocalciferol, (DRISDOL) 1.25 MG (50000 UNIT) CAPS capsule    Sig: Over-the-counter.  Take 1 capsule every 14 days.    Dispense:  30 capsule    Refill:  0    Order Specific Question:   Supervising Provider    Answer:   Dell Ponto [[id number]]      Objective:   VITALS: Per patient if applicable, see vitals. GENERAL: Alert and in no acute distress. CARDIOPULMONARY: No increased WOB. Speaking in clear sentences.  PSYCH: Pleasant and cooperative. Speech normal rate and rhythm. Affect is appropriate. Insight and judgement are appropriate. Attention is focused, linear, and appropriate.  NEURO: Oriented as arrived to appointment on time with no prompting.   Lab Results  Component Value Date   CREATININE 0.55 (L) 07/22/2022   BUN 12 07/22/2022   NA 141 07/22/2022   K 3.9 07/22/2022   CL 101 07/22/2022   CO2 24 07/22/2022   Lab Results  Component Value Date   ALT 17 07/22/2022   AST 16 07/22/2022   ALKPHOS 48 07/22/2022   BILITOT 0.4 07/22/2022   Lab Results  Component Value Date   HGBA1C 6.3 (H) 07/22/2022   HGBA1C 6.1 (H) 03/04/2022   HGBA1C 6.2 (H) 11/20/2021   HGBA1C 6.3 (H) 05/21/2021   HGBA1C 6.4 (A) 12/19/2020   Lab Results  Component Value Date   INSULIN 62.0 (H) 07/22/2022   INSULIN 81.0 (H) 03/04/2022   INSULIN 60.8 (H) 11/20/2021   INSULIN 64.3 (H) 05/21/2021   INSULIN 77.3 (H) 01/03/2021   Lab Results  Component Value Date   TSH 1.630 07/22/2022    Lab Results  Component Value Date   CHOL 147 07/22/2022   HDL 50 07/22/2022   LDLCALC 78 07/22/2022   TRIG 104 07/22/2022   CHOLHDL 5.7 (H) 05/15/2021   Lab Results  Component Value Date   WBC 7.1 07/22/2022   HGB 12.7 07/22/2022   HCT 39.9 07/22/2022   MCV 81 07/22/2022   PLT 314 07/22/2022   Lab Results  Component Value Date   IRON 32 12/03/2016   TIBC 363 12/03/2016   FERRITIN 13 (L) 02/09/2018   Lab Results  Component Value Date   VD25OH 44.6 07/22/2022   VD25OH 57.1 03/04/2022  VD25OH 72.8 11/20/2021    Attestation Statements:   Reviewed by clinician on day of visit: allergies, medications, problem list, medical history, surgical history, family history, social history, and previous encounter notes.

## 2022-10-02 ENCOUNTER — Encounter (INDEPENDENT_AMBULATORY_CARE_PROVIDER_SITE_OTHER): Payer: Self-pay | Admitting: Family Medicine

## 2022-10-02 ENCOUNTER — Telehealth (INDEPENDENT_AMBULATORY_CARE_PROVIDER_SITE_OTHER): Payer: Medicaid Other | Admitting: Family Medicine

## 2022-10-02 DIAGNOSIS — R7303 Prediabetes: Secondary | ICD-10-CM

## 2022-10-02 DIAGNOSIS — E669 Obesity, unspecified: Secondary | ICD-10-CM

## 2022-10-02 DIAGNOSIS — Z6841 Body Mass Index (BMI) 40.0 and over, adult: Secondary | ICD-10-CM

## 2022-10-02 DIAGNOSIS — R030 Elevated blood-pressure reading, without diagnosis of hypertension: Secondary | ICD-10-CM | POA: Diagnosis not present

## 2022-10-02 DIAGNOSIS — E559 Vitamin D deficiency, unspecified: Secondary | ICD-10-CM

## 2022-10-02 DIAGNOSIS — I10 Essential (primary) hypertension: Secondary | ICD-10-CM

## 2022-10-02 MED ORDER — HYDROCHLOROTHIAZIDE 25 MG PO TABS: 25.0000 mg | ORAL_TABLET | Freq: Every day | ORAL | 0 refills | Status: DC

## 2022-10-02 MED ORDER — LOSARTAN POTASSIUM 50 MG PO TABS: ORAL_TABLET | ORAL | 0 refills | Status: DC

## 2022-10-02 MED ORDER — VITAMIN D (ERGOCALCIFEROL) 1.25 MG (50000 UNIT) PO CAPS: ORAL_CAPSULE | ORAL | 0 refills | Status: DC

## 2022-10-02 MED ORDER — METFORMIN HCL 500 MG PO TABS: 500.0000 mg | ORAL_TABLET | Freq: Three times a day (TID) | ORAL | 0 refills | Status: DC

## 2022-10-08 ENCOUNTER — Other Ambulatory Visit: Payer: Self-pay | Admitting: Family Medicine

## 2022-10-08 ENCOUNTER — Ambulatory Visit (HOSPITAL_BASED_OUTPATIENT_CLINIC_OR_DEPARTMENT_OTHER): Payer: Medicaid Other | Attending: Family Medicine | Admitting: Physical Therapy

## 2022-10-08 ENCOUNTER — Encounter (HOSPITAL_BASED_OUTPATIENT_CLINIC_OR_DEPARTMENT_OTHER): Payer: Self-pay | Admitting: Physical Therapy

## 2022-10-08 DIAGNOSIS — M25512 Pain in left shoulder: Secondary | ICD-10-CM | POA: Diagnosis present

## 2022-10-08 DIAGNOSIS — J3089 Other allergic rhinitis: Secondary | ICD-10-CM

## 2022-10-08 DIAGNOSIS — M25511 Pain in right shoulder: Secondary | ICD-10-CM | POA: Diagnosis present

## 2022-10-08 DIAGNOSIS — R2689 Other abnormalities of gait and mobility: Secondary | ICD-10-CM | POA: Diagnosis present

## 2022-10-08 DIAGNOSIS — G8929 Other chronic pain: Secondary | ICD-10-CM | POA: Diagnosis present

## 2022-10-08 DIAGNOSIS — M25561 Pain in right knee: Secondary | ICD-10-CM | POA: Insufficient documentation

## 2022-10-08 NOTE — Therapy (Signed)
OUTPATIENT PHYSICAL THERAPY LOWER EXTREMITY Treatment    Patient Name: Carla Hanson MRN: [id number] DOB:[birth year], 56 y.o., female Today's Date: 10/08/2022   PT End of Session - 10/08/22 1308     Visit Number 6    Number of Visits 12    Date for PT Re-Evaluation 10/24/22    Authorization Type Medicaid-requesting 3 visits    PT Start Time 1116    PT Stop Time 1200    PT Time Calculation (min) 44 min    Activity Tolerance Patient tolerated treatment well    Behavior During Therapy [facility] for tasks assessed/performed               Past Medical History:  Diagnosis Date   Alcohol abuse    Anxiety    Back pain    Bipolar disorder ([place])    Bronchitis    COPD exacerbation ([place]) 05/10/2016   Drug use    History of blood clots    Hyperlipidemia    Hypertension    Influenza A 12/01/2018   Joint pain    Left leg DVT ([place]) 09/29/2013   Lipoma    Abdomen   LIPOMA 01/20/2008   Obesity    Occasional tremors 05/12/2019   Osteoarthritis of right knee    Other fatigue    Painful menstrual periods 01/05/2012   Pneumonia    Prediabetes    Seizure ([place])    Shortness of breath on exertion    Stroke ([place])    Tobacco abuse    Past Surgical History:  Procedure Laterality Date   CESAREAN SECTION     CYSTECTOMY     LIPOMA EXCISION  03/2011   ORIF ANKLE FRACTURE  03/13/2012   Procedure: OPEN REDUCTION INTERNAL FIXATION (ORIF) ANKLE FRACTURE;  Surgeon: Jessy Oto, MD;  Location: [facility];  Service: Orthopedics;  Laterality: Right;   TUBAL LIGATION     Patient Active Problem List   Diagnosis Date Noted   Chronic pain of both shoulders 09/05/2022   Right knee pain 07/18/2022   Other hyperlipidemia 05/15/2022   Seborrheic keratoses 02/26/2022   Allergic rhinitis 07/25/2021   Vitamin D deficiency 12/19/2020   Palpitation 12/03/2019   History of intracranial hemorrhage 08/02/2019   History of DVT of lower extremity 08/02/2019   History of seizure 08/02/2019   COPD, moderate ([place])  12/11/2018   Lumbar back pain 02/11/2018   History of stroke 08/13/2017   Iron deficiency anemia 12/12/2016   Pre-diabetes 03/02/2013   History of pulmonary embolism 05/05/2012   Former tobacco use 08/23/2009   Hyperlipidemia 04/30/2007   Class 3 severe obesity with serious comorbidity and body mass index (BMI) of 50.0 to 59.9 in adult [facility]) 01/01/2007   Essential hypertension 01/01/2007    PCP: Dr Andrena Mews   REFERRING PROVIDER: Dr Debbra Riding   REFERRING DIAG: [phone] (ICD-10-CM) - Right knee pain, unspecified chronicity  THERAPY DIAG:  Chronic pain of right knee  Other abnormalities of gait and mobility  Rationale for Evaluation and Treatment Rehabilitation  ONSET DATE: long standing knee and shoulder pain   SUBJECTIVE:   SUBJECTIVE STATEMENT: Patient reports high pain this am exiting from bed. Reduced some as she moved about home getting ready for session   PERTINENT HISTORY: Anxiety, Low back pain, COPD, Blood Clots, OA of the right knee; Obesity, Left leg DVT; Seizures; Stroke Tobacco abuse   PAIN:  Are you having pain? Yes: NPRS scale: 5/10 today 12/5 Pain location: lateral knee  Pain description:  aching Aggravating factors: pain gets worse as she exercises  Relieving factors: ice and rest   Are you having pain? Yes: NPRS scale: 4/10 at worst  Pain location: right shoulder pain and left is also achy  Pain description: aching Aggravating factors: had a recent severe exacerbation of her right shoulder pain  Relieving factors: ice and rest    PRECAUTIONS: None  WEIGHT BEARING RESTRICTIONS: No  FALLS:  Has patient fallen in last 6 months? No  LIVING ENVIRONMENT: Step up into the house  OCCUPATION:  Not working   Hobbies:  Walking; reading; going to the gym; going to church, music and signing    PLOF: Independent  PATIENT GOALS:   To have less pain in her knees and shoulders    OBJECTIVE:   DIAGNOSTIC FINDINGS:  X-ray of the right  knee:  IMPRESSION: Moderate to severe medial compartment and mild to moderate patellofemoral compartment osteoarthritis.   PATIENT SURVEYS:  FOTO    COGNITION: Overall cognitive status: Within functional limits for tasks assessed     SENSATION: WFL  EDEMA:    POSTURE: rounded shoulders and forward head Right knee valgus in standing   PALPATION: Limited patella motion   LOWER EXTREMITY ROM:  Active ROM Right eval Left eval  Hip flexion    Hip extension    Hip abduction    Hip adduction    Hip internal rotation    Hip external rotation    Knee flexion Pain at end range    Knee extension Mild extension limitation    Ankle dorsiflexion    Ankle plantarflexion    Ankle inversion    Ankle eversion     (Blank rows = not tested)  LOWER EXTREMITY MMT:  MMT Right eval Left eval  Hip flexion 34.5 24.7  Hip extension    Hip abduction 45.4 35.2  Hip adduction    Hip internal rotation    Hip external rotation    Knee flexion    Knee extension 38.9 43.3  Ankle dorsiflexion    Ankle plantarflexion    Ankle inversion    Ankle eversion     (Blank rows = not tested)  UPPER EXTREMITY MMT:  MMT Right eval Left eval  Shoulder flexion 4/5 3/5 Pain   Shoulder extension    Shoulder abduction    Shoulder adduction    Shoulder extension    Shoulder internal rotation 4/5 with pain  4/5 pain   Shoulder external rotation 4+/5 no pain  3+/5 pain   Middle trapezius    Lower trapezius    Elbow flexion    Elbow extension    Wrist flexion    Wrist extension    Wrist ulnar deviation    Wrist radial deviation    Wrist pronation    Wrist supination    Grip strength     (Blank rows = not tested)  Palpation: spasming of both upper traps  Tender to palpation in bipceps groove bilateral L> R   UPPER EXTREMITY ROM:  Active ROM Right eval Left eval  Shoulder flexion 130 105 painful   Shoulder extension    Shoulder abduction 90  60 painful   Shoulder adduction     Shoulder extension    Shoulder internal rotation To L2 without pain  To L3 without pain   Shoulder external rotation No pain  Minimal pain   Elbow flexion    Elbow extension    Wrist flexion    Wrist extension  Wrist ulnar deviation    Wrist radial deviation    Wrist pronation    Wrist supination     (Blank rows = not tested)  GAIT:  right knee valgus     TODAY'S TREATMENT                                                                          DATE:  10/08/22 Pt seen for aquatic therapy today.  Treatment took place in water 3.25-4.5 ft in depth at the [facility]. Temp of water was 92.  Pt entered/exited the pool via stair with hand rail.  UE unsupported walking forward, back and side stepping -forward and backward slow jog Standing df/pf ue supported on wall  -uilateral ue support wieght shifting in staggered stance Df/PF for ankle strerngthening Hip hinging for glut and LB strengthening  L press using noodle pt gains motor program then completes 3 x 15 R/L . Cues for core stabilization Standing balance on noodle: firing ankle relfexes; df/PF strengthening/stretching of muscles gastroc/ant tib.  Standing ue supported on yellow hand buoys: squats;hip add/abd; hip ext;   Pt requires the buoyancy and hydrostatic pressure of water for support, and to offload joints by unweighting joint load by at least 50 % in navel deep water and by at least 75-80% in chest to neck deep water.  Viscosity of the water is needed for resistance of strengthening. Water current perturbations provides challenge to standing balance requiring increased core activation.     11/16 Leg press 25 3x15 Hip abduction machine 3x15  Tips given for adjusting sets and reps depending on soreness levels.   1# [employer] Leg Press 15 lbs 2x15  #6 [employer] Hip Abduction 30 2x15    12# [employer] row 15 lbs 2x15  15# [employer] triceps press down 30 lbs 2x15   Cable Rows 10 lbs  2x10  Cable Extension 10 lbs  2x10     11/9 Manual: Grade 1 and II PA and PA glides for pain; STM to posterior knee and lateral knee. Reviewed self soft tissue mobilization for home  Quad set 3x15   Evaled patients shoulders;   Wand flexion in limited pain free range 3x10   Band er 2x10 red bilateral  Rows 2x10 red reviewed technique  Shoulder extension 2x10     PATIENT EDUCATION:  Education details: EP, symptom management; patella mobility  Person educated: Patient Education method: Explanation, Demonstration, Tactile cues, Verbal cues, and Handouts Education comprehension: verbalized understanding, returned demonstration, verbal cues required, tactile cues required, and needs further education  HOME EXERCISE PROGRAM: [email or web address]   ASSESSMENT:  CLINICAL IMPRESSION: Pt demonstrates indep in setting with therapist instructing from deck.  Added proprioceptive and kinesthetic input with le and core strengthening to improve timing of movement. Increased speed to encourage spontaneous improved movement patterns. She tolerates well with reduction of knee pain to 0/10 upon completion of session.    OBJECTIVE IMPAIRMENTS: Abnormal gait, decreased activity tolerance, difficulty walking, decreased ROM, decreased strength, and pain.   ACTIVITY LIMITATIONS: carrying, lifting, bending, sitting, standing, squatting, stairs, transfers, bathing, toileting, dressing, and locomotion level  PARTICIPATION LIMITATIONS: meal prep, cleaning, laundry, shopping, community activity, and yard work  PERSONAL FACTORS:  3+ comorbidities: Anxiety, Low back pain, COPD, Blood Clots, OA of the right knee; Obesity, Left leg DVT; Seizures; Stroke Tobacco abuse  are also affecting patient's functional outcome.   REHAB POTENTIAL: Good  CLINICAL DECISION MAKING: Evolving/moderate complexity increasing pain with activity   EVALUATION COMPLEXITY: Moderate   GOALS: Goals reviewed with patient? Yes  SHORT  TERM GOALS: Target date: 09/19/2022  Patient will increase left leg gross strength by 5 lbs  Baseline: Goal status: INITIAL  2.  Patient will demonstrate improved patella motion and less popping  Baseline:  Goal status: INITIAL  3.  Patient will be be able to modify her program when he knee OA flares up  Baseline:  Goal status: INITIAL   LONG TERM GOALS: Target date: 10/10/2022   Patient will return to a full gym program without significant pain  Baseline:  Goal status: INITIAL  2.  Patient will have a full pool program  Baseline:  Goal status: INITIAL  3.  Patient will demonstrate equa strength left and right in order to walk in the community without increased stresss on the right leg.  Baseline:  Goal status: INITIAL    PLAN:  PT FREQUENCY: 1-2x/week  PT DURATION: 6 weeks  PLANNED INTERVENTIONS: Therapeutic exercises, Therapeutic activity, Neuromuscular re-education, Balance training, Gait training, Patient/Family education, Self Care, Joint mobilization, Stair training, Aquatic Therapy, Dry Needling, Cryotherapy, Moist heat, Taping, Ultrasound, and Manual therapy  PLAN FOR NEXT SESSION: continue with patella motion; reviewe ome strengthening. Add in bridging. Consider taping. In the pool review gait training and hip strengthening. Review gym exercises for the knee and shoulders.    Stanton Kidney Tharon Aquas) Juleon Narang MPT 09/24/22 137 PM

## 2022-10-09 NOTE — Progress Notes (Signed)
TeleHealth Visit:  This visit was completed with telemedicine (audio/video) technology. Carla Hanson has verbally consented to this TeleHealth visit. The patient is located at home, the provider is located at home. The participants in this visit include the listed provider and patient. The visit was conducted today via MyChart video.  OBESITY Carla Hanson is here to discuss her progress with her obesity treatment plan along with follow-up of her obesity related diagnoses.   Today's visit was # 34 Starting weight: 324 lbs Starting date: 01/03/2021 Weight at last in office visit: 292 lbs on 07/22/22 Total weight loss: 32 lbs at last in office visit on 07/22/22. Today's reported weight: No weight reported.  Nutrition Plan: following a lower carbohydrate, vegetable and lean protein rich diet plan with 80 gms protein daily  Current exercise:  Cardio/weights for 60 minutes 4-5 days per week  Interim History: Carla Hanson has been journaling both protein and calories and is trying to eat low-carb.  Protein averages 76-78.  Sometimes her calories are low.  Water intake is good.  Very consistent with exercise regimen.  Assessment/Plan:  1. Prediabetes Last A1c was 6.3 on 07/22/2022. Denies polyphagia Medication(s): Metformin 500 mg 3 times daily with meals. Lab Results  Component Value Date   HGBA1C 6.3 (H) 07/22/2022   Lab Results  Component Value Date   INSULIN 62.0 (H) 07/22/2022   INSULIN 81.0 (H) 03/04/2022   INSULIN 60.8 (H) 11/20/2021   INSULIN 64.3 (H) 05/21/2021   INSULIN 77.3 (H) 01/03/2021    Plan: Continue metformin 500 mg 3 times daily with meals.   2. Obesity: Current BMI 53.4 Carla Hanson is currently in the action stage of change. As such, her goal is to continue with weight loss efforts.  She has agreed to keeping a food journal and adhering to recommended goals of 1200-1600 calories and 85 grams protein and following a lower carbohydrate, vegetable and lean protein rich diet plan.    Exercise goals:  as is  Behavioral modification strategies: increasing lean protein intake, decreasing simple carbohydrates, planning for success, and keeping a strict food journal.  Carla Hanson has agreed to follow-up with our clinic in 4 weeks.   No orders of the defined types were placed in this encounter.   There are no discontinued medications.   No orders of the defined types were placed in this encounter.     Objective:   VITALS: Per patient if applicable, see vitals. GENERAL: Alert and in no acute distress. CARDIOPULMONARY: No increased WOB. Speaking in clear sentences.  PSYCH: Pleasant and cooperative. Speech normal rate and rhythm. Affect is appropriate. Insight and judgement are appropriate. Attention is focused, linear, and appropriate.  NEURO: Oriented as arrived to appointment on time with no prompting.   Lab Results  Component Value Date   CREATININE 0.55 (L) 07/22/2022   BUN 12 07/22/2022   NA 141 07/22/2022   K 3.9 07/22/2022   CL 101 07/22/2022   CO2 24 07/22/2022   Lab Results  Component Value Date   ALT 17 07/22/2022   AST 16 07/22/2022   ALKPHOS 48 07/22/2022   BILITOT 0.4 07/22/2022   Lab Results  Component Value Date   HGBA1C 6.3 (H) 07/22/2022   HGBA1C 6.1 (H) 03/04/2022   HGBA1C 6.2 (H) 11/20/2021   HGBA1C 6.3 (H) 05/21/2021   HGBA1C 6.4 (A) 12/19/2020   Lab Results  Component Value Date   INSULIN 62.0 (H) 07/22/2022   INSULIN 81.0 (H) 03/04/2022   INSULIN 60.8 (H) 11/20/2021   INSULIN  64.3 (H) 05/21/2021   INSULIN 77.3 (H) 01/03/2021   Lab Results  Component Value Date   TSH 1.630 07/22/2022   Lab Results  Component Value Date   CHOL 147 07/22/2022   HDL 50 07/22/2022   LDLCALC 78 07/22/2022   TRIG 104 07/22/2022   CHOLHDL 5.7 (H) 05/15/2021   Lab Results  Component Value Date   WBC 7.1 07/22/2022   HGB 12.7 07/22/2022   HCT 39.9 07/22/2022   MCV 81 07/22/2022   PLT 314 07/22/2022   Lab Results  Component Value Date    IRON 32 12/03/2016   TIBC 363 12/03/2016   FERRITIN 13 (L) 02/09/2018   Lab Results  Component Value Date   VD25OH 44.6 07/22/2022   VD25OH 57.1 03/04/2022   VD25OH 72.8 11/20/2021    Attestation Statements:   Reviewed by clinician on day of visit: allergies, medications, problem list, medical history, surgical history, family history, social history, and previous encounter notes.

## 2022-10-14 ENCOUNTER — Encounter (INDEPENDENT_AMBULATORY_CARE_PROVIDER_SITE_OTHER): Payer: Self-pay | Admitting: Family Medicine

## 2022-10-14 ENCOUNTER — Telehealth (INDEPENDENT_AMBULATORY_CARE_PROVIDER_SITE_OTHER): Payer: Medicaid Other | Admitting: Family Medicine

## 2022-10-14 DIAGNOSIS — E669 Obesity, unspecified: Secondary | ICD-10-CM | POA: Diagnosis not present

## 2022-10-14 DIAGNOSIS — Z6841 Body Mass Index (BMI) 40.0 and over, adult: Secondary | ICD-10-CM

## 2022-10-14 DIAGNOSIS — R7303 Prediabetes: Secondary | ICD-10-CM

## 2022-10-15 ENCOUNTER — Ambulatory Visit (HOSPITAL_BASED_OUTPATIENT_CLINIC_OR_DEPARTMENT_OTHER): Payer: Medicaid Other | Admitting: Physical Therapy

## 2022-10-15 ENCOUNTER — Encounter (HOSPITAL_BASED_OUTPATIENT_CLINIC_OR_DEPARTMENT_OTHER): Payer: Self-pay | Admitting: Physical Therapy

## 2022-10-15 DIAGNOSIS — G8929 Other chronic pain: Secondary | ICD-10-CM

## 2022-10-15 DIAGNOSIS — M25561 Pain in right knee: Secondary | ICD-10-CM | POA: Diagnosis not present

## 2022-10-15 DIAGNOSIS — R2689 Other abnormalities of gait and mobility: Secondary | ICD-10-CM

## 2022-10-15 NOTE — Therapy (Signed)
OUTPATIENT PHYSICAL THERAPY LOWER EXTREMITY Treatment    Patient Name: Carla Hanson MRN: [id number] DOB:January 27, 1966, 56 y.o., female Today's Date: 10/08/2022   PT End of Session - 10/08/22 1308     Visit Number 6    Number of Visits 12    Date for PT Re-Evaluation 10/24/22    Authorization Type Medicaid-requesting 3 visits    PT Start Time 1116    PT Stop Time 1200    PT Time Calculation (min) 44 min    Activity Tolerance Patient tolerated treatment well    Behavior During Therapy [facility] for tasks assessed/performed               Past Medical History:  Diagnosis Date   Alcohol abuse    Anxiety    Back pain    Bipolar disorder ([place])    Bronchitis    COPD exacerbation ([place]) 05/10/2016   Drug use    History of blood clots    Hyperlipidemia    Hypertension    Influenza A 12/01/2018   Joint pain    Left leg DVT ([place]) 09/29/2013   Lipoma    Abdomen   LIPOMA 01/20/2008   Obesity    Occasional tremors 05/12/2019   Osteoarthritis of right knee    Other fatigue    Painful menstrual periods 01/05/2012   Pneumonia    Prediabetes    Seizure ([place])    Shortness of breath on exertion    Stroke ([place])    Tobacco abuse    Past Surgical History:  Procedure Laterality Date   CESAREAN SECTION     CYSTECTOMY     LIPOMA EXCISION  03/2011   ORIF ANKLE FRACTURE  03/13/2012   Procedure: OPEN REDUCTION INTERNAL FIXATION (ORIF) ANKLE FRACTURE;  Surgeon: Jessy Oto, MD;  Location: [facility];  Service: Orthopedics;  Laterality: Right;   TUBAL LIGATION     Patient Active Problem List   Diagnosis Date Noted   Chronic pain of both shoulders 09/05/2022   Right knee pain 07/18/2022   Other hyperlipidemia 05/15/2022   Seborrheic keratoses 02/26/2022   Allergic rhinitis 07/25/2021   Vitamin D deficiency 12/19/2020   Palpitation 12/03/2019   History of intracranial hemorrhage 08/02/2019   History of DVT of lower extremity 08/02/2019   History of seizure 08/02/2019   COPD, moderate ([place])  12/11/2018   Lumbar back pain 02/11/2018   History of stroke 08/13/2017   Iron deficiency anemia 12/12/2016   Pre-diabetes 03/02/2013   History of pulmonary embolism 05/05/2012   Former tobacco use 08/23/2009   Hyperlipidemia 04/30/2007   Class 3 severe obesity with serious comorbidity and body mass index (BMI) of 50.0 to 59.9 in adult [facility]) 01/01/2007   Essential hypertension 01/01/2007    PCP: Dr Andrena Mews   REFERRING PROVIDER: Dr Debbra Riding   REFERRING DIAG: [phone] (ICD-10-CM) - Right knee pain, unspecified chronicity  THERAPY DIAG:  Chronic pain of right knee  Other abnormalities of gait and mobility  Rationale for Evaluation and Treatment Rehabilitation  ONSET DATE: long standing knee and shoulder pain   SUBJECTIVE:   SUBJECTIVE STATEMENT: Patient reports pain in knees has been really bad the last two . She wants to try water aerobic classes.   PERTINENT HISTORY: Anxiety, Low back pain, COPD, Blood Clots, OA of the right knee; Obesity, Left leg DVT; Seizures; Stroke Tobacco abuse   PAIN:  Are you having pain? Yes: NPRS scale:7/10 current Pain location: bilateral knee  Pain description: aching Aggravating  factors: pain gets worse as she exercises  Relieving factors: ice and rest   Are you having pain? Yes: NPRS scale: 4/10 at worst  Pain location: right shoulder pain and left is also achy  Pain description: aching Aggravating factors: had a recent severe exacerbation of her right shoulder pain  Relieving factors: ice and rest    PRECAUTIONS: None  WEIGHT BEARING RESTRICTIONS: No  FALLS:  Has patient fallen in last 6 months? No  LIVING ENVIRONMENT: Step up into the house  OCCUPATION:  Not working   Hobbies:  Walking; reading; going to the gym; going to church, music and signing    PLOF: Independent  PATIENT GOALS:   To have less pain in her knees and shoulders    OBJECTIVE:   DIAGNOSTIC FINDINGS:  X-ray of the right knee:   IMPRESSION: Moderate to severe medial compartment and mild to moderate patellofemoral compartment osteoarthritis.   PATIENT SURVEYS:  FOTO    COGNITION: Overall cognitive status: Within functional limits for tasks assessed     SENSATION: WFL  EDEMA:    POSTURE: rounded shoulders and forward head Right knee valgus in standing   PALPATION: Limited patella motion   LOWER EXTREMITY ROM:  Active ROM Right eval Left eval  Hip flexion    Hip extension    Hip abduction    Hip adduction    Hip internal rotation    Hip external rotation    Knee flexion Pain at end range    Knee extension Mild extension limitation    Ankle dorsiflexion    Ankle plantarflexion    Ankle inversion    Ankle eversion     (Blank rows = not tested)  LOWER EXTREMITY MMT:  MMT Right eval Left eval  Hip flexion 34.5 24.7  Hip extension    Hip abduction 45.4 35.2  Hip adduction    Hip internal rotation    Hip external rotation    Knee flexion    Knee extension 38.9 43.3  Ankle dorsiflexion    Ankle plantarflexion    Ankle inversion    Ankle eversion     (Blank rows = not tested)  UPPER EXTREMITY MMT:  MMT Right eval Left eval  Shoulder flexion 4/5 3/5 Pain   Shoulder extension    Shoulder abduction    Shoulder adduction    Shoulder extension    Shoulder internal rotation 4/5 with pain  4/5 pain   Shoulder external rotation 4+/5 no pain  3+/5 pain   Middle trapezius    Lower trapezius    Elbow flexion    Elbow extension    Wrist flexion    Wrist extension    Wrist ulnar deviation    Wrist radial deviation    Wrist pronation    Wrist supination    Grip strength     (Blank rows = not tested)  Palpation: spasming of both upper traps  Tender to palpation in bipceps groove bilateral L> R   UPPER EXTREMITY ROM:  Active ROM Right eval Left eval  Shoulder flexion 130 105 painful   Shoulder extension    Shoulder abduction 90  60 painful   Shoulder adduction     Shoulder extension    Shoulder internal rotation To L2 without pain  To L3 without pain   Shoulder external rotation No pain  Minimal pain   Elbow flexion    Elbow extension    Wrist flexion    Wrist extension    Wrist ulnar  deviation    Wrist radial deviation    Wrist pronation    Wrist supination     (Blank rows = not tested)  GAIT:  right knee valgus     TODAY'S TREATMENT                                                                          DATE:  Pt seen for aquatic therapy today.  Treatment took place in water 3.25-4.5 ft in depth at the [place]. Temp of water was 92.  Pt entered/exited the pool via stair with hand rail.  UE support on barbell: walking forward, back and side stepping. Gait curing for heel strike and toe off which improves with submersion -forward and backward slow jog -side stepping with increased speed -vertical and supine suspension with noodle under arms anteriorly:cycling; add/abd and skiing           Standing : df/pf ue supported on ; add/abd x 10 Hip hinging for glut and LB strengthening  *L press using noodle pt gains motor program then completes 3 x 15 R/L . Cues for core stabilization *Using hand bells: core and hip strengthening working sagittal and frontal plane oscillations sets of 15 sec intervals le staggered then together.    Pt requires the buoyancy and hydrostatic pressure of water for support, and to offload joints by unweighting joint load by at least 50 % in navel deep water and by at least 75-80% in chest to neck deep water.  Viscosity of the water is needed for resistance of strengthening. Water current perturbations provides challenge to standing balance requiring increased core activation.     11/16 Leg press 25 3x15 Hip abduction machine 3x15  Tips given for adjusting sets and reps depending on soreness levels.   1# [employer]itness Leg Press 15 lbs 2x15  #6 [employer] Hip Abduction 30 2x15    12# Life  fitness row 15 lbs 2x15  15# Life fitness triceps press down 30 lbs 2x15   Cable Rows 10 lbs 2x10  Cable Extension 10 lbs  2x10     11/9 Manual: Grade 1 and II PA and PA glides for pain; STM to posterior knee and lateral knee. Reviewed self soft tissue mobilization for home  Quad set 3x15   Evaled patients shoulders;   Wand flexion in limited pain free range 3x10   Band er 2x10 red bilateral  Rows 2x10 red reviewed technique  Shoulder extension 2x10     PATIENT EDUCATION:  Education details: EP, symptom management; patella mobility  Person educated: Patient Education method: Explanation, Demonstration, Tactile cues, Verbal cues, and Handouts Education comprehension: verbalized understanding, returned demonstration, verbal cues required, tactile cues required, and needs further education  HOME EXERCISE PROGRAM: [email or web address]   ASSESSMENT:  CLINICAL IMPRESSION: Pt demonstrates indep in setting with therapist instructing from deck.  Pt reporting higher than usual pain which does reduce upon completion of aquatic session (7/10 to 3-4/10). She continues to tolerate strength tasks for core and hip stability and proprioceptive and kinesthetic retraining to improve spatial awareness and balance. Focus today on proximal strength to improve distal function.   Moderate VC and demonstration given for proper execution of tasks.  Next  session increase focus on quad and hamstring strength.   OBJECTIVE IMPAIRMENTS: Abnormal gait, decreased activity tolerance, difficulty walking, decreased ROM, decreased strength, and pain.   ACTIVITY LIMITATIONS: carrying, lifting, bending, sitting, standing, squatting, stairs, transfers, bathing, toileting, dressing, and locomotion level  PARTICIPATION LIMITATIONS: meal prep, cleaning, laundry, shopping, community activity, and yard work  PERSONAL FACTORS: 3+ comorbidities: Anxiety, Low back pain, COPD, Blood Clots, OA of the right knee; Obesity, Left leg  DVT; Seizures; Stroke Tobacco abuse  are also affecting patient's functional outcome.   REHAB POTENTIAL: Good  CLINICAL DECISION MAKING: Evolving/moderate complexity increasing pain with activity   EVALUATION COMPLEXITY: Moderate   GOALS: Goals reviewed with patient? Yes  SHORT TERM GOALS: Target date: 09/19/2022  Patient will increase left leg gross strength by 5 lbs  Baseline: Goal status: INITIAL  2.  Patient will demonstrate improved patella motion and less popping  Baseline:  Goal status: INITIAL  3.  Patient will be be able to modify her program when he knee OA flares up  Baseline:  Goal status: INITIAL   LONG TERM GOALS: Target date: 10/10/2022   Patient will return to a full gym program without significant pain  Baseline:  Goal status: INITIAL  2.  Patient will have a full pool program  Baseline:  Goal status: INITIAL  3.  Patient will demonstrate equa strength left and right in order to walk in the community without increased stresss on the right leg.  Baseline:  Goal status: INITIAL    PLAN:  PT FREQUENCY: 1-2x/week  PT DURATION: 6 weeks  PLANNED INTERVENTIONS: Therapeutic exercises, Therapeutic activity, Neuromuscular re-education, Balance training, Gait training, Patient/Family education, Self Care, Joint mobilization, Stair training, Aquatic Therapy, Dry Needling, Cryotherapy, Moist heat, Taping, Ultrasound, and Manual therapy  PLAN FOR NEXT SESSION: continue with patella motion; reviewe ome strengthening. Add in bridging. Consider taping. In the pool review gait training and hip strengthening. Review gym exercises for the knee and shoulders.    Stanton Kidney Tharon Aquas) Shearon Clonch MPT 09/24/22 137 PM

## 2022-10-21 NOTE — Therapy (Signed)
OUTPATIENT PHYSICAL THERAPY LOWER EXTREMITY Treatment    Patient Name: Carla Hanson MRN: [id number] DOB:April 14, 1966, 56 y.o., female Today's Date: 10/22/2022   PT End of Session - 10/22/22 1022     Visit Number 8    Number of Visits 12    Date for PT Re-Evaluation 10/24/22    Authorization Type Medicaid-requesting 3 visits    Authorization Time Period approved 3 visits through 7/16 -8/3    PT Start Time 1031    PT Stop Time 1115    PT Time Calculation (min) 44 min    Activity Tolerance Patient tolerated treatment well    Behavior During Therapy [employer] for tasks assessed/performed                Past Medical History:  Diagnosis Date   Alcohol abuse    Anxiety    Back pain    Bipolar disorder ([place])    Bronchitis    COPD exacerbation ([place]) 05/10/2016   Drug use    History of blood clots    Hyperlipidemia    Hypertension    Influenza A 12/01/2018   Joint pain    Left leg DVT ([place]) 09/29/2013   Lipoma    Abdomen   LIPOMA 01/20/2008   Obesity    Occasional tremors 05/12/2019   Osteoarthritis of right knee    Other fatigue    Painful menstrual periods 01/05/2012   Pneumonia    Prediabetes    Seizure ([place])    Shortness of breath on exertion    Stroke ([place])    Tobacco abuse    Past Surgical History:  Procedure Laterality Date   CESAREAN SECTION     CYSTECTOMY     LIPOMA EXCISION  03/2011   ORIF ANKLE FRACTURE  03/13/2012   Procedure: OPEN REDUCTION INTERNAL FIXATION (ORIF) ANKLE FRACTURE;  Surgeon: Jessy Oto, MD;  Location: [facility];  Service: Orthopedics;  Laterality: Right;   TUBAL LIGATION     Patient Active Problem List   Diagnosis Date Noted   Chronic pain of both shoulders 09/05/2022   Right knee pain 07/18/2022   Other hyperlipidemia 05/15/2022   Seborrheic keratoses 02/26/2022   Allergic rhinitis 07/25/2021   Vitamin D deficiency 12/19/2020   Palpitation 12/03/2019   History of intracranial hemorrhage 08/02/2019   History of DVT of lower extremity  08/02/2019   History of seizure 08/02/2019   COPD, moderate ([place]) 12/11/2018   Lumbar back pain 02/11/2018   History of stroke 08/13/2017   Iron deficiency anemia 12/12/2016   Pre-diabetes 03/02/2013   History of pulmonary embolism 05/05/2012   Former tobacco use 08/23/2009   Hyperlipidemia 04/30/2007   Class 3 severe obesity with serious comorbidity and body mass index (BMI) of 50.0 to 59.9 in adult ([place]) 01/01/2007   Essential hypertension 01/01/2007    PCP: Dr Andrena Mews   REFERRING PROVIDER: Dr Debbra Riding   REFERRING DIAG: [phone] (ICD-10-CM) - Right knee pain, unspecified chronicity  THERAPY DIAG:  Chronic pain of right knee  Other abnormalities of gait and mobility  Chronic left shoulder pain  Chronic right shoulder pain  Rationale for Evaluation and Treatment Rehabilitation  ONSET DATE: long standing knee and shoulder pain   SUBJECTIVE:   SUBJECTIVE STATEMENT: Patient reports pain in knees not bad today   PERTINENT HISTORY: Anxiety, Low back pain, COPD, Blood Clots, OA of the right knee; Obesity, Left leg DVT; Seizures; Stroke Tobacco abuse   PAIN:  Are you having pain? Yes: NPRS  scale:3/10 current Pain location: bilateral knee  Pain description: aching Aggravating factors: pain gets worse as she exercises  Relieving factors: ice and rest   Are you having pain? Yes: NPRS scale: 4/10 at worst  Pain location: right shoulder pain and left is also achy  Pain description: aching Aggravating factors: had a recent severe exacerbation of her right shoulder pain  Relieving factors: ice and rest    PRECAUTIONS: None  WEIGHT BEARING RESTRICTIONS: No  FALLS:  Has patient fallen in last 6 months? No  LIVING ENVIRONMENT: Step up into the house  OCCUPATION:  Not working   Hobbies:  Walking; reading; going to the gym; going to church, music and signing    PLOF: Independent  PATIENT GOALS:   To have less pain in her knees and shoulders     OBJECTIVE:   DIAGNOSTIC FINDINGS:  X-ray of the right knee:  IMPRESSION: Moderate to severe medial compartment and mild to moderate patellofemoral compartment osteoarthritis.   PATIENT SURVEYS:  FOTO    COGNITION: Overall cognitive status: Within functional limits for tasks assessed     SENSATION: WFL  EDEMA:    POSTURE: rounded shoulders and forward head Right knee valgus in standing   PALPATION: Limited patella motion   LOWER EXTREMITY ROM:  Active ROM Right eval Left eval  Hip flexion    Hip extension    Hip abduction    Hip adduction    Hip internal rotation    Hip external rotation    Knee flexion Pain at end range    Knee extension Mild extension limitation    Ankle dorsiflexion    Ankle plantarflexion    Ankle inversion    Ankle eversion     (Blank rows = not tested)  LOWER EXTREMITY MMT:  MMT Right eval Left eval  Hip flexion 34.5 24.7  Hip extension    Hip abduction 45.4 35.2  Hip adduction    Hip internal rotation    Hip external rotation    Knee flexion    Knee extension 38.9 43.3  Ankle dorsiflexion    Ankle plantarflexion    Ankle inversion    Ankle eversion     (Blank rows = not tested)  UPPER EXTREMITY MMT:  MMT Right eval Left eval  Shoulder flexion 4/5 3/5 Pain   Shoulder extension    Shoulder abduction    Shoulder adduction    Shoulder extension    Shoulder internal rotation 4/5 with pain  4/5 pain   Shoulder external rotation 4+/5 no pain  3+/5 pain   Middle trapezius    Lower trapezius    Elbow flexion    Elbow extension    Wrist flexion    Wrist extension    Wrist ulnar deviation    Wrist radial deviation    Wrist pronation    Wrist supination    Grip strength     (Blank rows = not tested)  Palpation: spasming of both upper traps  Tender to palpation in bipceps groove bilateral L> R   UPPER EXTREMITY ROM:  Active ROM Right eval Left eval  Shoulder flexion 130 105 painful   Shoulder extension     Shoulder abduction 90  60 painful   Shoulder adduction    Shoulder extension    Shoulder internal rotation To L2 without pain  To L3 without pain   Shoulder external rotation No pain  Minimal pain   Elbow flexion    Elbow extension    Wrist  flexion    Wrist extension    Wrist ulnar deviation    Wrist radial deviation    Wrist pronation    Wrist supination     (Blank rows = not tested)  GAIT:  right knee valgus     TODAY'S TREATMENT                                                                            Pt seen for aquatic therapy today.  Treatment took place in water 3.25-4.5 ft in depth at the [place]. Temp of water was 92.  Pt entered/exited the pool via stair with hand rail.  UE support on barbell: walking forward, back and side stepping. Gait curing for heel strike and toe off  -forward and backward slow jog -side stepping with increased speed Hip hinging for glut and LB strengthening  LE SLS with opposite oscillations in sagittal and frontal planes 2-3 x 10-12 reps -Leg press sm blue squoodle 3 x 15 s interval fast/slow R/L           Standing : df/pf unsupported ; add/abd x 10. Squats 3.6 ft 2x10 cues for firing target muscles Completed with abdominal bracing Using hand bells: core and hip strengthening working sagittal and frontal plane oscillations sets of 10-12 reps  le staggered  Pt requires the buoyancy and hydrostatic pressure of water for support, and to offload joints by unweighting joint load by at least 50 % in navel deep water and by at least 75-80% in chest to neck deep water.  Viscosity of the water is needed for resistance of strengthening. Water current perturbations provides challenge to standing balance requiring increased core activation.     11/16 Leg press 25 3x15 Hip abduction machine 3x15  Tips given for adjusting sets and reps depending on soreness levels.   1# [employer]itness Leg Press 15 lbs 2x15  #6 [employer] Hip  Abduction 30 2x15    12# Life fitness row 15 lbs 2x15  15# Life fitness triceps press down 30 lbs 2x15   Cable Rows 10 lbs 2x10  Cable Extension 10 lbs  2x10     11/9 Manual: Grade 1 and II PA and PA glides for pain; STM to posterior knee and lateral knee. Reviewed self soft tissue mobilization for home  Quad set 3x15   Evaled patients shoulders;   Wand flexion in limited pain free range 3x10   Band er 2x10 red bilateral  Rows 2x10 red reviewed technique  Shoulder extension 2x10     PATIENT EDUCATION:  Education details: EP, symptom management; patella mobility  Person educated: Patient Education method: Explanation, Demonstration, Tactile cues, Verbal cues, and Handouts Education comprehension: verbalized understanding, returned demonstration, verbal cues required, tactile cues required, and needs further education  HOME EXERCISE PROGRAM: [email or web address]  Aquatics added 10/22/22 FZ  ASSESSMENT:  CLINICAL IMPRESSION: Pt demonstrates indep in setting with therapist instructing from deck. Will return to land based next visit  Pt with improved motor planning/execution of oscillations Ue (added le) increasing proprioceptive and kinesthetic input and core strengthening. No pain with activity, overall reduction from beginning of session to end 0/10. No LOB. Pt due for re-assess/recert. She will benefit from  another aquatic session to ensure indep with HEP. If pt dc aquatic Hep assigned in medbridge    OBJECTIVE IMPAIRMENTS: Abnormal gait, decreased activity tolerance, difficulty walking, decreased ROM, decreased strength, and pain.   ACTIVITY LIMITATIONS: carrying, lifting, bending, sitting, standing, squatting, stairs, transfers, bathing, toileting, dressing, and locomotion level  PARTICIPATION LIMITATIONS: meal prep, cleaning, laundry, shopping, community activity, and yard work  PERSONAL FACTORS: 3+ comorbidities: Anxiety, Low back pain, COPD, Blood Clots, OA of the right  knee; Obesity, Left leg DVT; Seizures; Stroke Tobacco abuse  are also affecting patient's functional outcome.   REHAB POTENTIAL: Good  CLINICAL DECISION MAKING: Evolving/moderate complexity increasing pain with activity   EVALUATION COMPLEXITY: Moderate   GOALS: Goals reviewed with patient? Yes  SHORT TERM GOALS: Target date: 09/19/2022  Patient will increase left leg gross strength by 5 lbs  Baseline: Goal status: ongoing  2.  Patient will demonstrate improved patella motion and less popping  Baseline:  Goal status: ongoing  3.  Patient will be be able to modify her program when he knee OA flares up  Baseline:  Goal status: ongoing   LONG TERM GOALS: Target date: 10/24/2022   Patient will return to a full gym program without significant pain  Baseline:  Goal status: INITIAL  2.  Patient will have a full pool program  Baseline:  Goal status: INITIAL  3.  Patient will demonstrate equa strength left and right in order to walk in the community without increased stresss on the right leg.  Baseline:  Goal status: INITIAL    PLAN:  PT FREQUENCY: 1-2x/week  PT DURATION: 6 weeks  PLANNED INTERVENTIONS: Therapeutic exercises, Therapeutic activity, Neuromuscular re-education, Balance training, Gait training, Patient/Family education, Self Care, Joint mobilization, Stair training, Aquatic Therapy, Dry Needling, Cryotherapy, Moist heat, Taping, Ultrasound, and Manual therapy  PLAN FOR NEXT SESSION: continue with patella motion; reviewe ome strengthening. Add in bridging. Consider taping. In the pool review gait training and hip strengthening. Review gym exercises for the knee and shoulders.    Stanton Kidney Tharon Aquas) Hatsumi Steinhart MPT 09/24/22 137 PM

## 2022-10-22 ENCOUNTER — Encounter (HOSPITAL_BASED_OUTPATIENT_CLINIC_OR_DEPARTMENT_OTHER): Payer: Self-pay | Admitting: Physical Therapy

## 2022-10-22 ENCOUNTER — Ambulatory Visit (HOSPITAL_BASED_OUTPATIENT_CLINIC_OR_DEPARTMENT_OTHER): Payer: Medicaid Other | Admitting: Physical Therapy

## 2022-10-22 DIAGNOSIS — M25561 Pain in right knee: Secondary | ICD-10-CM | POA: Diagnosis not present

## 2022-10-22 DIAGNOSIS — G8929 Other chronic pain: Secondary | ICD-10-CM

## 2022-10-22 DIAGNOSIS — R2689 Other abnormalities of gait and mobility: Secondary | ICD-10-CM

## 2022-10-29 ENCOUNTER — Ambulatory Visit (HOSPITAL_BASED_OUTPATIENT_CLINIC_OR_DEPARTMENT_OTHER): Payer: Medicaid Other | Admitting: Physical Therapy

## 2022-10-29 DIAGNOSIS — M25561 Pain in right knee: Secondary | ICD-10-CM | POA: Diagnosis not present

## 2022-10-29 DIAGNOSIS — R2689 Other abnormalities of gait and mobility: Secondary | ICD-10-CM

## 2022-10-29 DIAGNOSIS — G8929 Other chronic pain: Secondary | ICD-10-CM

## 2022-10-29 NOTE — Therapy (Signed)
OUTPATIENT PHYSICAL THERAPY LOWER EXTREMITY Treatment    Patient Name: Carla Hanson MRN: [id number] DOB:01-30-1966, 56 y.o., female Today's Date: 10/22/2022   PT End of Session - 10/22/22 1022     Visit Number 8    Number of Visits 12    Date for PT Re-Evaluation 10/24/22    Authorization Type Medicaid-requesting 3 visits    Authorization Time Period approved 3 visits through 7/16 -8/3    PT Start Time 1031    PT Stop Time 1115    PT Time Calculation (min) 44 min    Activity Tolerance Patient tolerated treatment well    Behavior During Therapy [facility] for tasks assessed/performed                Past Medical History:  Diagnosis Date   Alcohol abuse    Anxiety    Back pain    Bipolar disorder ([place])    Bronchitis    COPD exacerbation ([place]) 05/10/2016   Drug use    History of blood clots    Hyperlipidemia    Hypertension    Influenza A 12/01/2018   Joint pain    Left leg DVT ([place]) 09/29/2013   Lipoma    Abdomen   LIPOMA 01/20/2008   Obesity    Occasional tremors 05/12/2019   Osteoarthritis of right knee    Other fatigue    Painful menstrual periods 01/05/2012   Pneumonia    Prediabetes    Seizure ([place])    Shortness of breath on exertion    Stroke ([place])    Tobacco abuse    Past Surgical History:  Procedure Laterality Date   CESAREAN SECTION     CYSTECTOMY     LIPOMA EXCISION  03/2011   ORIF ANKLE FRACTURE  03/13/2012   Procedure: OPEN REDUCTION INTERNAL FIXATION (ORIF) ANKLE FRACTURE;  Surgeon: Jessy Oto, MD;  Location: [facility];  Service: Orthopedics;  Laterality: Right;   TUBAL LIGATION     Patient Active Problem List   Diagnosis Date Noted   Chronic pain of both shoulders 09/05/2022   Right knee pain 07/18/2022   Other hyperlipidemia 05/15/2022   Seborrheic keratoses 02/26/2022   Allergic rhinitis 07/25/2021   Vitamin D deficiency 12/19/2020   Palpitation 12/03/2019   History of intracranial hemorrhage 08/02/2019   History of DVT of lower extremity  08/02/2019   History of seizure 08/02/2019   COPD, moderate ([place]) 12/11/2018   Lumbar back pain 02/11/2018   History of stroke 08/13/2017   Iron deficiency anemia 12/12/2016   Pre-diabetes 03/02/2013   History of pulmonary embolism 05/05/2012   Former tobacco use 08/23/2009   Hyperlipidemia 04/30/2007   Class 3 severe obesity with serious comorbidity and body mass index (BMI) of 50.0 to 59.9 in adult ([place]) 01/01/2007   Essential hypertension 01/01/2007    PCP: Dr Andrena Mews   REFERRING PROVIDER: Dr Debbra Riding   REFERRING DIAG: [phone] (ICD-10-CM) - Right knee pain, unspecified chronicity  THERAPY DIAG:  Chronic pain of right knee  Other abnormalities of gait and mobility  Chronic left shoulder pain  Chronic right shoulder pain  Rationale for Evaluation and Treatment Rehabilitation  ONSET DATE: long standing knee and shoulder pain   SUBJECTIVE:   SUBJECTIVE STATEMENT: Patient reports pain in knees not bad today   PERTINENT HISTORY: Anxiety, Low back pain, COPD, Blood Clots, OA of the right knee; Obesity, Left leg DVT; Seizures; Stroke Tobacco abuse   PAIN:  Are you having pain? Yes: NPRS  scale:3/10 current Pain location: bilateral knee  Pain description: aching Aggravating factors: pain gets worse as she exercises  Relieving factors: ice and rest   Are you having pain? Yes: NPRS scale: 4/10 at worst  Pain location: right shoulder pain and left is also achy  Pain description: aching Aggravating factors: had a recent severe exacerbation of her right shoulder pain  Relieving factors: ice and rest    PRECAUTIONS: None  WEIGHT BEARING RESTRICTIONS: No  FALLS:  Has patient fallen in last 6 months? No  LIVING ENVIRONMENT: Step up into the house  OCCUPATION:  Not working   Hobbies:  Walking; reading; going to the gym; going to church, music and signing    PLOF: Independent  PATIENT GOALS:   To have less pain in her knees and shoulders     OBJECTIVE:   DIAGNOSTIC FINDINGS:  X-ray of the right knee:  IMPRESSION: Moderate to severe medial compartment and mild to moderate patellofemoral compartment osteoarthritis.   PATIENT SURVEYS:  FOTO    COGNITION: Overall cognitive status: Within functional limits for tasks assessed     SENSATION: WFL  EDEMA:    POSTURE: rounded shoulders and forward head Right knee valgus in standing   PALPATION: Limited patella motion   LOWER EXTREMITY ROM:  Active ROM Right eval Left eval  Hip flexion    Hip extension    Hip abduction    Hip adduction    Hip internal rotation    Hip external rotation    Knee flexion Pain at end range    Knee extension Mild extension limitation    Ankle dorsiflexion    Ankle plantarflexion    Ankle inversion    Ankle eversion     (Blank rows = not tested)  LOWER EXTREMITY MMT:  MMT Right eval Left eval    Hip flexion 34.5 24.7 30.1 32.6  Hip extension      Hip abduction 45.4 35.2 47.7 42.5  Hip adduction      Hip internal rotation      Hip external rotation      Knee flexion      Knee extension 38.9 43.3 50.9 52.7  Ankle dorsiflexion      Ankle plantarflexion      Ankle inversion      Ankle eversion       (Blank rows = not tested)  UPPER EXTREMITY MMT:  MMT Right eval Left eval    Shoulder flexion 4/5 3/5 Pain  4+/5 4+/5 mild pain   Shoulder extension      Shoulder abduction      Shoulder adduction      Shoulder extension      Shoulder internal rotation 4/5 with pain  4/5 pain  5/5 5/5  Shoulder external rotation 4+/5 no pain  3+/5 pain  4+/5 4+/5   Middle trapezius      Lower trapezius      Elbow flexion      Elbow extension      Wrist flexion      Wrist extension      Wrist ulnar deviation      Wrist radial deviation      Wrist pronation      Wrist supination      Grip strength       (Blank rows = not tested)  Palpation: spasming of both upper traps  Tender to palpation in bipceps groove bilateral L>  R   UPPER EXTREMITY ROM:  Active ROM Right eval  Left eval Right  Left   Shoulder flexion 130 105 painful  155 147   Shoulder extension      Shoulder abduction 90  60 painful     Shoulder adduction      Shoulder extension      Shoulder internal rotation To L2 without pain  To L3 without pain  To mid back without pain  To mid back without pain   Shoulder external rotation No pain  Minimal pain  No pain  No pain    Elbow flexion      Elbow extension      Wrist flexion      Wrist extension      Wrist ulnar deviation      Wrist radial deviation      Wrist pronation      Wrist supination       (Blank rows = not tested)  GAIT:  right knee valgus     TODAY'S TREATMENT                                                                           12/26 Performed re-assessment on the patient. Reviewed test and measures and goals including strength testing, FOTO, and ROM   Reviewed goals and POC going forward   Bilateral er 2x15 red  Bilateral horizontal abduction 2x15 red  Bilateral flexion  2x15 red   Row machine 2x15 10 lbs  Shoulder extensions 2x15 10 lbs   Lat visit  Pt seen for aquatic therapy today.  Treatment took place in water 3.25-4.5 ft in depth at the [place]. Temp of water was 92.  Pt entered/exited the pool via stair with hand rail.  UE support on barbell: walking forward, back and side stepping. Gait curing for heel strike and toe off  -forward and backward slow jog -side stepping with increased speed Hip hinging for glut and LB strengthening  LE SLS with opposite oscillations in sagittal and frontal planes 2-3 x 10-12 reps -Leg press sm blue squoodle 3 x 15 s interval fast/slow R/L           Standing : df/pf unsupported ; add/abd x 10. Squats 3.6 ft 2x10 cues for firing target muscles Completed with abdominal bracing Using hand bells: core and hip strengthening working sagittal and frontal plane oscillations sets of 10-12 reps  le  staggered  Pt requires the buoyancy and hydrostatic pressure of water for support, and to offload joints by unweighting joint load by at least 50 % in navel deep water and by at least 75-80% in chest to neck deep water.  Viscosity of the water is needed for resistance of strengthening. Water current perturbations provides challenge to standing balance requiring increased core activation.     11/16 Leg press 25 3x15 Hip abduction machine 3x15  Tips given for adjusting sets and reps depending on soreness levels.   1# [employer] Leg Press 15 lbs 2x15  #6 [employer] Hip Abduction 30 2x15    12# [employer] row 15 lbs 2x15  15# [employer] triceps press down 30 lbs 2x15   Cable Rows 10 lbs 2x10  Cable Extension 10 lbs  2x10     11/9  Manual: Grade 1 and II PA and PA glides for pain; STM to posterior knee and lateral knee. Reviewed self soft tissue mobilization for home  Quad set 3x15   Evaled patients shoulders;   Wand flexion in limited pain free range 3x10   Band er 2x10 red bilateral  Rows 2x10 red reviewed technique  Shoulder extension 2x10     PATIENT EDUCATION:  Education details: EP, symptom management; patella mobility  Person educated: Patient Education method: Explanation, Demonstration, Tactile cues, Verbal cues, and Handouts Education comprehension: verbalized understanding, returned demonstration, verbal cues required, tactile cues required, and needs further education  HOME EXERCISE PROGRAM: [id number]  Aquatics added 10/22/22 FZ  ASSESSMENT:  CLINICAL IMPRESSION: The patient has made great progress. Her left strength has improved about 25% with all muscle groups tested. She had a significant improvement in left shoulder flexion strength, ER strength, and IR motion. She would benefit from further skilled therapy 1W6 to finalize water HEP and review final gym program. We reviewed a seated UE series of exercises. See below for goal specific progress.     OBJECTIVE IMPAIRMENTS: Abnormal gait, decreased activity tolerance, difficulty walking, decreased ROM, decreased strength, and pain.   ACTIVITY LIMITATIONS: carrying, lifting, bending, sitting, standing, squatting, stairs, transfers, bathing, toileting, dressing, and locomotion level  PARTICIPATION LIMITATIONS: meal prep, cleaning, laundry, shopping, community activity, and yard work  PERSONAL FACTORS: 3+ comorbidities: Anxiety, Low back pain, COPD, Blood Clots, OA of the right knee; Obesity, Left leg DVT; Seizures; Stroke Tobacco abuse  are also affecting patient's functional outcome.   REHAB POTENTIAL: Good  CLINICAL DECISION MAKING: Evolving/moderate complexity increasing pain with activity   EVALUATION COMPLEXITY: Moderate   GOALS: Goals reviewed with patient? Yes  SHORT TERM GOALS: Target date: 09/19/2022  Patient will increase left leg gross strength by 5 lbs  Baseline: Goal status: ongoing  2.  Patient will demonstrate improved patella motion and less popping  Baseline:  Goal status: ongoing  3.  Patient will be be able to modify her program when he knee OA flares up  Baseline:  Goal status: ongoing   LONG TERM GOALS: Target date: 10/24/2022   Patient will return to a full gym program without significant pain  Baseline:  Goal status: continuing to progress towards full program 12/26  2.  Patient will have a full pool program  Baseline:  Goal status: should finalize in the next few visits 12/26  3.  Patient will demonstrate equa strength left and right in order to walk in the community without increased stresss on the right leg.  Baseline: achieved. Significant improvements 12/26    PLAN:  PT FREQUENCY: 1-2x/week  PT DURATION: 6 weeks  PLANNED INTERVENTIONS: Therapeutic exercises, Therapeutic activity, Neuromuscular re-education, Balance training, Gait training, Patient/Family education, Self Care, Joint mobilization, Stair training, Aquatic Therapy,  Dry Needling, Cryotherapy, Moist heat, Taping, Ultrasound, and Manual therapy  PLAN FOR NEXT SESSION: continue with patella motion; reviewe ome strengthening. Add in bridging. Consider taping. In the pool review gait training and hip strengthening. Review gym exercises for the knee and shoulders.   Carolyne Littles PT DPT  09/24/22 137 PM  Check all possible CPT codes: 37858 - PT Re-evaluation, [phone]- Neuro Re-education, [phone] - Gait Training, [phone] - Manual Therapy, 97530 - Therapeutic Activities, 97014 - Electrical stimulation (unattended), [id number] - Iontophoresis, [id number] - Ultrasound, and [id number] - Aquatic therapy    Check all conditions that are expected to impact treatment: Musculoskeletal disorders   If treatment provided at initial evaluation,  no treatment charged due to lack of authorization.

## 2022-11-07 ENCOUNTER — Encounter (INDEPENDENT_AMBULATORY_CARE_PROVIDER_SITE_OTHER): Payer: Self-pay | Admitting: Family Medicine

## 2022-11-11 ENCOUNTER — Encounter (HOSPITAL_BASED_OUTPATIENT_CLINIC_OR_DEPARTMENT_OTHER): Payer: Self-pay

## 2022-11-11 ENCOUNTER — Ambulatory Visit (HOSPITAL_BASED_OUTPATIENT_CLINIC_OR_DEPARTMENT_OTHER): Payer: Medicaid Other | Admitting: Physical Therapy

## 2022-11-12 ENCOUNTER — Other Ambulatory Visit (INDEPENDENT_AMBULATORY_CARE_PROVIDER_SITE_OTHER): Payer: Self-pay | Admitting: Family Medicine

## 2022-11-12 DIAGNOSIS — R7303 Prediabetes: Secondary | ICD-10-CM

## 2022-11-13 ENCOUNTER — Telehealth (INDEPENDENT_AMBULATORY_CARE_PROVIDER_SITE_OTHER): Payer: Self-pay | Admitting: Family Medicine

## 2022-11-13 ENCOUNTER — Ambulatory Visit (INDEPENDENT_AMBULATORY_CARE_PROVIDER_SITE_OTHER): Payer: Medicaid Other | Admitting: Family Medicine

## 2022-11-18 ENCOUNTER — Ambulatory Visit (HOSPITAL_BASED_OUTPATIENT_CLINIC_OR_DEPARTMENT_OTHER): Payer: Medicaid Other | Attending: Family Medicine | Admitting: Physical Therapy

## 2022-11-18 DIAGNOSIS — G8929 Other chronic pain: Secondary | ICD-10-CM | POA: Insufficient documentation

## 2022-11-18 DIAGNOSIS — R2689 Other abnormalities of gait and mobility: Secondary | ICD-10-CM | POA: Insufficient documentation

## 2022-11-18 DIAGNOSIS — M25512 Pain in left shoulder: Secondary | ICD-10-CM | POA: Insufficient documentation

## 2022-11-18 DIAGNOSIS — M25511 Pain in right shoulder: Secondary | ICD-10-CM | POA: Insufficient documentation

## 2022-11-18 DIAGNOSIS — M25561 Pain in right knee: Secondary | ICD-10-CM | POA: Insufficient documentation

## 2022-12-03 NOTE — Progress Notes (Signed)
TeleHealth Visit:  This visit was completed with telemedicine (audio/video) technology. Carla Hanson has verbally consented to this TeleHealth visit. The patient is located at home, the provider is located at home. The participants in this visit include the listed provider and patient. The visit was conducted today via MyChart video.  OBESITY Carla Hanson is here to discuss her progress with her obesity treatment plan along with follow-up of her obesity related diagnoses.   Today's visit was # 35 Starting weight: 324 lbs Starting date: 01/03/2021 Weight at last in office visit: 292 lbs on 07/22/22 Total weight loss: 32 lbs at last in office visit on 07/22/22. Today's reported weight: 291 lbs   Nutrition Plan:  keeping a food journal and adhering to recommended goals of 1200-1600 calories and 85 grams protein and following a lower carbohydrate, vegetable and lean protein rich diet plan. .   Current exercise:  Cardio/weights for 60 minutes 4-5 days per week. Goes to [employer] 3 days/week and works out at home 2 days/week.  Interim History:  Carla Hanson reports not having much appetite.  However she is keeping calories between 1200-1600/day and meeting her protein goal when she has a Premier protein shake. She has no appetite in the morning and likes to have a protein shake before she goes to the gym.  She has 2 meals at home with at least 4 to 6 ounces of protein.  She cooks for herself at home. She is interested in the upcoming water fitness class for our patients at [facility]  Would like follow-up every 2 weeks.  She is feels more accountable with this. Assessment/Plan:  1. Prediabetes Last A1c was 6.3 and fasting insulin is high at 62.  She is taking metformin 1000 mg in the morning and 500 mg in the evening.  Lab Results  Component Value Date   HGBA1C 6.3 (H) 07/22/2022   Lab Results  Component Value Date   INSULIN 62.0 (H) 07/22/2022   INSULIN 81.0 (H) 03/04/2022   INSULIN 60.8 (H)  11/20/2021   INSULIN 64.3 (H) 05/21/2021   INSULIN 77.3 (H) 01/03/2021    Plan: Continue and refill metformin 1000 mg in the morning and 500 mg in the evening with meals. Check labs next office visit.   2. Hyperlipidemia LDL at goal-78.  HDL normal at 50 and triglycerides normal at 104.  On Crestor 20 mg daily.  Denies myalgias. 10-year ASCVD risk score is 6.2%.  Lab Results  Component Value Date   CHOL 147 07/22/2022   HDL 50 07/22/2022   LDLCALC 78 07/22/2022   TRIG 104 07/22/2022   CHOLHDL 5.7 (H) 05/15/2021   Lab Results  Component Value Date   ALT 17 07/22/2022   AST 16 07/22/2022   ALKPHOS 48 07/22/2022   BILITOT 0.4 07/22/2022   The 10-year ASCVD risk score (Arnett DK, et al., 2019) is: 6.2%   Values used to calculate the score:     Age: 57 years     Sex: Female     Is Non-Hispanic African American: Yes     Diabetic: No     Tobacco smoker: No     Systolic Blood Pressure: 143 mmHg     Is BP treated: Yes     HDL Cholesterol: 50 mg/dL     Total Cholesterol: 147 mg/dL  Plan: Refill Crestor 20 mg daily. Continue exercise, weight loss, good intake of fiber.  3. Obesity: Current BMI 53 Carla Hanson is currently in the action stage of change. As such, her goal  is to continue with weight loss efforts.  She has agreed to keeping a food journal and adhering to recommended goals of 1200-1600 calories and 85 gms protein daily.  Exercise goals:  as is  Behavioral modification strategies: increasing lean protein intake, decreasing simple carbohydrates, and meal planning and cooking strategies.  Carla Hanson has agreed to follow-up with our clinic in 2 weeks, fasting.   No orders of the defined types were placed in this encounter.   Medications Discontinued During This Encounter  Medication Reason   metFORMIN (GLUCOPHAGE) 500 MG tablet Reorder   rosuvastatin (CRESTOR) 20 MG tablet Reorder     Meds ordered this encounter  Medications   rosuvastatin (CRESTOR) 20 MG tablet     Sig: Take 1 tablet (20 mg total) by mouth daily.    Dispense:  90 tablet    Refill:  0    Order Specific Question:   Supervising Provider    Answer:   Glennis Brink [[id number]]   metFORMIN (GLUCOPHAGE) 500 MG tablet    Sig: Take 2 pills in the am and one pill at night with food.    Dispense:  90 tablet    Refill:  0    Order Specific Question:   Supervising Provider    Answer:   Glennis Brink [[id number]]      Objective:   VITALS: Per patient if applicable, see vitals. GENERAL: Alert and in no acute distress. CARDIOPULMONARY: No increased WOB. Speaking in clear sentences.  PSYCH: Pleasant and cooperative. Speech normal rate and rhythm. Affect is appropriate. Insight and judgement are appropriate. Attention is focused, linear, and appropriate.  NEURO: Oriented as arrived to appointment on time with no prompting.   Lab Results  Component Value Date   CREATININE 0.55 (L) 07/22/2022   BUN 12 07/22/2022   NA 141 07/22/2022   K 3.9 07/22/2022   CL 101 07/22/2022   CO2 24 07/22/2022   Lab Results  Component Value Date   ALT 17 07/22/2022   AST 16 07/22/2022   ALKPHOS 48 07/22/2022   BILITOT 0.4 07/22/2022   Lab Results  Component Value Date   HGBA1C 6.3 (H) 07/22/2022   HGBA1C 6.1 (H) 03/04/2022   HGBA1C 6.2 (H) 11/20/2021   HGBA1C 6.3 (H) 05/21/2021   HGBA1C 6.4 (A) 12/19/2020   Lab Results  Component Value Date   INSULIN 62.0 (H) 07/22/2022   INSULIN 81.0 (H) 03/04/2022   INSULIN 60.8 (H) 11/20/2021   INSULIN 64.3 (H) 05/21/2021   INSULIN 77.3 (H) 01/03/2021   Lab Results  Component Value Date   TSH 1.630 07/22/2022   Lab Results  Component Value Date   CHOL 147 07/22/2022   HDL 50 07/22/2022   LDLCALC 78 07/22/2022   TRIG 104 07/22/2022   CHOLHDL 5.7 (H) 05/15/2021   Lab Results  Component Value Date   WBC 7.1 07/22/2022   HGB 12.7 07/22/2022   HCT 39.9 07/22/2022   MCV 81 07/22/2022   PLT 314 07/22/2022   Lab Results  Component Value Date   IRON 32  12/03/2016   TIBC 363 12/03/2016   FERRITIN 13 (L) 02/09/2018   Lab Results  Component Value Date   VD25OH 44.6 07/22/2022   VD25OH 57.1 03/04/2022   VD25OH 72.8 11/20/2021    Attestation Statements:   Reviewed by clinician on day of visit: allergies, medications, problem list, medical history, surgical history, family history, social history, and previous encounter notes.

## 2022-12-04 ENCOUNTER — Telehealth (INDEPENDENT_AMBULATORY_CARE_PROVIDER_SITE_OTHER): Payer: Medicaid Other | Admitting: Family Medicine

## 2022-12-04 ENCOUNTER — Encounter (INDEPENDENT_AMBULATORY_CARE_PROVIDER_SITE_OTHER): Payer: Self-pay | Admitting: Family Medicine

## 2022-12-04 ENCOUNTER — Other Ambulatory Visit: Payer: Self-pay | Admitting: Family Medicine

## 2022-12-04 DIAGNOSIS — Z6841 Body Mass Index (BMI) 40.0 and over, adult: Secondary | ICD-10-CM

## 2022-12-04 DIAGNOSIS — R7303 Prediabetes: Secondary | ICD-10-CM | POA: Diagnosis not present

## 2022-12-04 DIAGNOSIS — E7849 Other hyperlipidemia: Secondary | ICD-10-CM

## 2022-12-04 DIAGNOSIS — E669 Obesity, unspecified: Secondary | ICD-10-CM

## 2022-12-04 MED ORDER — ROSUVASTATIN CALCIUM 20 MG PO TABS: 20.0000 mg | ORAL_TABLET | Freq: Every day | ORAL | 0 refills | Status: DC

## 2022-12-04 MED ORDER — METFORMIN HCL 500 MG PO TABS: ORAL_TABLET | ORAL | 0 refills | Status: DC

## 2022-12-11 ENCOUNTER — Other Ambulatory Visit (INDEPENDENT_AMBULATORY_CARE_PROVIDER_SITE_OTHER): Payer: Self-pay | Admitting: Family Medicine

## 2022-12-11 DIAGNOSIS — R7303 Prediabetes: Secondary | ICD-10-CM

## 2022-12-16 ENCOUNTER — Other Ambulatory Visit (INDEPENDENT_AMBULATORY_CARE_PROVIDER_SITE_OTHER): Payer: Self-pay | Admitting: Family Medicine

## 2022-12-16 DIAGNOSIS — R7303 Prediabetes: Secondary | ICD-10-CM

## 2022-12-18 ENCOUNTER — Other Ambulatory Visit: Payer: Self-pay | Admitting: Family Medicine

## 2022-12-18 ENCOUNTER — Ambulatory Visit (INDEPENDENT_AMBULATORY_CARE_PROVIDER_SITE_OTHER): Payer: Medicaid Other | Admitting: Family Medicine

## 2022-12-18 ENCOUNTER — Encounter (INDEPENDENT_AMBULATORY_CARE_PROVIDER_SITE_OTHER): Payer: Self-pay | Admitting: Family Medicine

## 2022-12-18 VITALS — BP 137/83 | HR 68 | Temp 98.2°F | Ht 62.0 in | Wt 289.0 lb

## 2022-12-18 DIAGNOSIS — E559 Vitamin D deficiency, unspecified: Secondary | ICD-10-CM

## 2022-12-18 DIAGNOSIS — I10 Essential (primary) hypertension: Secondary | ICD-10-CM | POA: Diagnosis not present

## 2022-12-18 DIAGNOSIS — E7849 Other hyperlipidemia: Secondary | ICD-10-CM

## 2022-12-18 DIAGNOSIS — R7303 Prediabetes: Secondary | ICD-10-CM

## 2022-12-18 DIAGNOSIS — Z1231 Encounter for screening mammogram for malignant neoplasm of breast: Secondary | ICD-10-CM

## 2022-12-18 DIAGNOSIS — Z6841 Body Mass Index (BMI) 40.0 and over, adult: Secondary | ICD-10-CM | POA: Diagnosis not present

## 2022-12-19 LAB — CMP14+EGFR
ALT: 14 IU/L (ref 0–32)
AST: 14 IU/L (ref 0–40)
Albumin/Globulin Ratio: 1.3 (ref 1.2–2.2)
Albumin: 4.3 g/dL (ref 3.8–4.9)
Alkaline Phosphatase: 48 IU/L (ref 44–121)
BUN/Creatinine Ratio: 25 — ABNORMAL HIGH (ref 9–23)
BUN: 13 mg/dL (ref 6–24)
Bilirubin Total: 0.3 mg/dL (ref 0.0–1.2)
CO2: 25 mmol/L (ref 20–29)
Calcium: 9.7 mg/dL (ref 8.7–10.2)
Chloride: 103 mmol/L (ref 96–106)
Creatinine, Ser: 0.53 mg/dL — ABNORMAL LOW (ref 0.57–1.00)
Globulin, Total: 3.2 g/dL (ref 1.5–4.5)
Glucose: 96 mg/dL (ref 70–99)
Potassium: 4.1 mmol/L (ref 3.5–5.2)
Sodium: 142 mmol/L (ref 134–144)
Total Protein: 7.5 g/dL (ref 6.0–8.5)
eGFR: 108 mL/min/{1.73_m2} (ref >59)

## 2022-12-19 LAB — LIPID PANEL WITH LDL/HDL RATIO
Cholesterol, Total: 145 mg/dL (ref 100–199)
HDL: 50 mg/dL (ref >39)
LDL Chol Calc (NIH): 79 mg/dL (ref 0–99)
LDL/HDL Ratio: 1.6 ratio (ref 0.0–3.2)
Triglycerides: 83 mg/dL (ref 0–149)
VLDL Cholesterol Cal: 16 mg/dL (ref 5–40)

## 2022-12-19 LAB — VITAMIN D 25 HYDROXY (VIT D DEFICIENCY, FRACTURES): Vit D, 25-Hydroxy: 56.8 ng/mL (ref 30.0–100.0)

## 2022-12-19 LAB — VITAMIN B12: Vitamin B-12: 411 pg/mL (ref 232–1245)

## 2022-12-19 LAB — INSULIN, RANDOM: INSULIN: 48 u[IU]/mL — ABNORMAL HIGH (ref 2.6–24.9)

## 2022-12-19 LAB — HEMOGLOBIN A1C
Est. average glucose Bld gHb Est-mCnc: 128 mg/dL
Hgb A1c MFr Bld: 6.1 % — ABNORMAL HIGH (ref 4.8–5.6)

## 2022-12-20 DIAGNOSIS — Z1231 Encounter for screening mammogram for malignant neoplasm of breast: Secondary | ICD-10-CM

## 2022-12-23 NOTE — Telephone Encounter (Signed)
Open in error

## 2022-12-24 ENCOUNTER — Other Ambulatory Visit: Payer: Self-pay | Admitting: Family Medicine

## 2022-12-24 DIAGNOSIS — Z1231 Encounter for screening mammogram for malignant neoplasm of breast: Secondary | ICD-10-CM

## 2022-12-28 ENCOUNTER — Other Ambulatory Visit (INDEPENDENT_AMBULATORY_CARE_PROVIDER_SITE_OTHER): Payer: Self-pay | Admitting: Family Medicine

## 2022-12-28 DIAGNOSIS — I10 Essential (primary) hypertension: Secondary | ICD-10-CM

## 2022-12-31 NOTE — Progress Notes (Unsigned)
  TeleHealth Visit:  This visit was completed with telemedicine (audio/video) technology. Carla Hanson has verbally consented to this TeleHealth visit. The patient is located at home, the provider is located at home. The participants in this visit include the listed provider and patient. The visit was conducted today via MyChart video.  OBESITY Carla Hanson is here to discuss her progress with her obesity treatment plan along with follow-up of her obesity related diagnoses.   Today's visit was # 22 tarting weight: 324 lbs Starting date: 01/03/2021 Weight at last in office visit: 289 lbs on 12/18/22 Total weight loss: 35 lbs at last in office visit on 12/18/22. Today's reported weight: *** lbs No weight reported.  Nutrition Plan: keeping a food journal with goal of 1200-1600 calories and 85 grams of protein daily.  Current exercise: {exercise types:16438} Cardio/weights for 60 minutes 4-5 days per week. Goes to [employer] 3 days/week and works out at home 2 days/week.   Interim History:  ***  Antiobesity Medication Carla Hanson is on {dwwglp:29109}. Adverse side effects: {dwwse:29122} Hunger is {EWCONTROLASSESSMENT:24261}.  Assessment/Plan:  1. ***  2. ***  3. ***  {dwwmorbid:29108::"Morbid Obesity"}: Current BMI *** Antiobesity Medication Plan {dwwmed:29123} {dwwglp:29109}.  Carla Hanson {CHL AMB IS/IS NOT:210130109} currently in the action stage of change. As such, her goal is to {MWMwtloss#1:210800005}.  She has agreed to {dwwsldiets:29085}.  Exercise goals: {MWM EXERCISE RECS:23473}  Behavioral modification strategies: {dwwslwtlossstrategies:29088}.  Carla Hanson has agreed to follow-up with our clinic in {NUMBER 1-10:22536} weeks.   No orders of the defined types were placed in this encounter.   There are no discontinued medications.   No orders of the defined types were placed in this encounter.     Objective:   VITALS: Per patient if applicable, see vitals. GENERAL: Alert and in no  acute distress. CARDIOPULMONARY: No increased WOB. Speaking in clear sentences.  PSYCH: Pleasant and cooperative. Speech normal rate and rhythm. Affect is appropriate. Insight and judgement are appropriate. Attention is focused, linear, and appropriate.  NEURO: Oriented as arrived to appointment on time with no prompting.   Attestation Statements:   Reviewed by clinician on day of visit: allergies, medications, problem list, medical history, surgical history, family history, social history, and previous encounter notes.  ***(delete if time-based billing not used) Time spent on visit including the items listed below was *** minutes.  -preparing to see the patient (e.g., review of tests, history, previous notes) -obtaining and/or reviewing separately obtained history -counseling and educating the patient/family/caregiver -documenting clinical information in the electronic or other health record -ordering medications, tests, or procedures -independently interpreting results and communicating results to the patient/ family/caregiver -referring and communicating with other health care professionals  -care coordination   This was prepared with the assistance of Presenter, broadcasting.  Occasional wrong-word or sound-a-like substitutions may have occurred due to the inherent limitations of voice recognition software.

## 2023-01-01 ENCOUNTER — Encounter (INDEPENDENT_AMBULATORY_CARE_PROVIDER_SITE_OTHER): Payer: Self-pay | Admitting: Family Medicine

## 2023-01-01 ENCOUNTER — Other Ambulatory Visit (INDEPENDENT_AMBULATORY_CARE_PROVIDER_SITE_OTHER): Payer: Self-pay | Admitting: Family Medicine

## 2023-01-01 ENCOUNTER — Telehealth (INDEPENDENT_AMBULATORY_CARE_PROVIDER_SITE_OTHER): Payer: Medicaid Other | Admitting: Family Medicine

## 2023-01-01 DIAGNOSIS — Z6841 Body Mass Index (BMI) 40.0 and over, adult: Secondary | ICD-10-CM | POA: Diagnosis not present

## 2023-01-01 DIAGNOSIS — R7303 Prediabetes: Secondary | ICD-10-CM | POA: Diagnosis not present

## 2023-01-01 DIAGNOSIS — E559 Vitamin D deficiency, unspecified: Secondary | ICD-10-CM | POA: Diagnosis not present

## 2023-01-01 DIAGNOSIS — E7849 Other hyperlipidemia: Secondary | ICD-10-CM

## 2023-01-01 DIAGNOSIS — I10 Essential (primary) hypertension: Secondary | ICD-10-CM

## 2023-01-01 MED ORDER — METFORMIN HCL 500 MG PO TABS: ORAL_TABLET | ORAL | 0 refills | Status: DC

## 2023-01-01 NOTE — Progress Notes (Signed)
Chief Complaint:   OBESITY [name] is here to discuss her progress with her obesity treatment plan along with follow-up of her obesity related diagnoses. Chloemarie is on keeping a food journal and adhering to recommended goals of 1200-1600 calories and 85 grams of protein and states she is following her eating plan approximately 50% of the time. Aeliana states she is at the gym for 60 minutes 3 times per week.  Today's visit was #: 63 Starting weight: 324 lbs Starting date: 01/03/2021 Today's weight: 289 lbs Today's date: 12/18/2022 Total lbs lost to date: 35 Total lbs lost since last in-office visit: 3  Interim History: Kaydin continues to do well with her weight loss.  She tried to do the low carbohydrate plan but she struggled and went back to journaling, and she is trying to meet her protein goals.  She would like to be seen every 2 to 3 weeks for a while as she feels this helps with her accountability.  Subjective:   1. Essential hypertension Emry's blood pressure has improved from her last visit.  No side effects were noted with her current medications.  She has no signs of hypotension.  2. Pre-diabetes Kamauria is working on decreasing simple carbohydrates in her diet.  Her last A1c was 6.3, but she is doing better with her diet and exercise.  3. Vitamin D deficiency Falon is on vitamin D, and she is due for labs.  No side effects were noted.  4. Other hyperlipidemia Valenda would like to be able to decrease statin with continued weight loss.  No side effects were noted.  Assessment/Plan:   1. Essential hypertension We will check labs today.  Aurie will continue with her diet, exercise, and medications.  - CMP14+EGFR  2. Pre-diabetes We will check labs today.  Gissell will continue with her metformin, diet, and exercise.  - CMP14+EGFR - Insulin, random - Hemoglobin A1c - Vitamin B12  3. Vitamin D deficiency We will check labs today, and we will follow-up at  [facility] next visit.  - VITAMIN D 25 Hydroxy (Vit-D Deficiency, Fractures)  4. Other hyperlipidemia We will check labs today.  Izora will continue Crestor, and she was advised on how statins work with cholesterol protection and her liver and the possibility that she may need to stay on medications but hopefully at a lower dose.  - Lipid Panel With LDL/HDL Ratio  5. BMI 50.0-59.9, adult ([place])  6. Obesity, Beginning BMI 57.4 Atiya is currently in the action stage of change. As such, her goal is to continue with weight loss efforts. She has agreed to keeping a food journal and adhering to recommended goals of 1200-1600 calories and 85 grams of protein daily.   Exercise goals: As is.   Behavioral modification strategies: increasing lean protein intake and meal planning and cooking strategies.  Wenona has agreed to follow-up with our clinic in 2 to 3 weeks. She was informed of the importance of frequent follow-up visits to maximize her success with intensive lifestyle modifications for her multiple health conditions.   Sobia was informed we would discuss her lab results at her next visit unless there is a critical issue that needs to be addressed sooner. Dzyre agreed to keep her next visit at the agreed upon time to discuss these results.  Objective:   Blood pressure 137/83, pulse 68, temperature 98.2 F (36.8 C), height '5\' 2"'$  (1.575 m), weight 289 lb (131.1 kg), SpO2 99 %. Body mass index is 52.86 kg/m.  Lab Results  Component Value Date   CREATININE 0.53 (L) 12/18/2022   BUN 13 12/18/2022   NA 142 12/18/2022   K 4.1 12/18/2022   CL 103 12/18/2022   CO2 25 12/18/2022   Lab Results  Component Value Date   ALT 14 12/18/2022   AST 14 12/18/2022   ALKPHOS 48 12/18/2022   BILITOT 0.3 12/18/2022   Lab Results  Component Value Date   HGBA1C 6.1 (H) 12/18/2022   HGBA1C 6.3 (H) 07/22/2022   HGBA1C 6.1 (H) 03/04/2022   HGBA1C 6.2 (H) 11/20/2021   HGBA1C 6.3 (H) 05/21/2021    Lab Results  Component Value Date   INSULIN 48.0 (H) 12/18/2022   INSULIN 62.0 (H) 07/22/2022   INSULIN 81.0 (H) 03/04/2022   INSULIN 60.8 (H) 11/20/2021   INSULIN 64.3 (H) 05/21/2021   Lab Results  Component Value Date   TSH 1.630 07/22/2022   Lab Results  Component Value Date   CHOL 145 12/18/2022   HDL 50 12/18/2022   LDLCALC 79 12/18/2022   TRIG 83 12/18/2022   CHOLHDL 5.7 (H) 05/15/2021   Lab Results  Component Value Date   VD25OH 56.8 12/18/2022   VD25OH 44.6 07/22/2022   VD25OH 57.1 03/04/2022   Lab Results  Component Value Date   WBC 7.1 07/22/2022   HGB 12.7 07/22/2022   HCT 39.9 07/22/2022   MCV 81 07/22/2022   PLT 314 07/22/2022   Lab Results  Component Value Date   IRON 32 12/03/2016   TIBC 363 12/03/2016   FERRITIN 13 (L) 02/09/2018   Attestation Statements:   Reviewed by clinician on day of visit: allergies, medications, problem list, medical history, surgical history, family history, social history, and previous encounter notes.   I, Trixie Dredge, am acting as transcriptionist for Dennard Nip, MD.  I have reviewed the above documentation for accuracy and completeness, and I agree with the above. -  Dennard Nip, MD

## 2023-01-06 ENCOUNTER — Other Ambulatory Visit (INDEPENDENT_AMBULATORY_CARE_PROVIDER_SITE_OTHER): Payer: Self-pay | Admitting: Family Medicine

## 2023-01-06 DIAGNOSIS — R7303 Prediabetes: Secondary | ICD-10-CM

## 2023-01-09 ENCOUNTER — Ambulatory Visit (HOSPITAL_BASED_OUTPATIENT_CLINIC_OR_DEPARTMENT_OTHER): Payer: Medicaid Other | Attending: Family Medicine | Admitting: Physical Therapy

## 2023-01-09 ENCOUNTER — Encounter (HOSPITAL_BASED_OUTPATIENT_CLINIC_OR_DEPARTMENT_OTHER): Payer: Self-pay | Admitting: Physical Therapy

## 2023-01-09 DIAGNOSIS — M25511 Pain in right shoulder: Secondary | ICD-10-CM | POA: Diagnosis present

## 2023-01-09 DIAGNOSIS — M25561 Pain in right knee: Secondary | ICD-10-CM | POA: Diagnosis not present

## 2023-01-09 DIAGNOSIS — R2689 Other abnormalities of gait and mobility: Secondary | ICD-10-CM | POA: Insufficient documentation

## 2023-01-09 DIAGNOSIS — G8929 Other chronic pain: Secondary | ICD-10-CM | POA: Diagnosis present

## 2023-01-09 DIAGNOSIS — M25512 Pain in left shoulder: Secondary | ICD-10-CM | POA: Insufficient documentation

## 2023-01-09 NOTE — Therapy (Signed)
OUTPATIENT PHYSICAL THERAPY LOWER EXTREMITY Re-eval   Patient Name: Carla Hanson MRN: [id number] DOB:[birth year], 57 y.o., female Today's Date: 10/22/2022   PT End of Session - 10/22/22 1022     Visit Number 8    Number of Visits 12    Date for PT Re-Evaluation 10/24/22    Authorization Type Medicaid-requesting 3 visits    Authorization Time Period approved 3 visits through 7/16 -8/3    PT Start Time 1031    PT Stop Time 1115    PT Time Calculation (min) 44 min    Activity Tolerance Patient tolerated treatment well    Behavior During Therapy [facility] for tasks assessed/performed                Past Medical History:  Diagnosis Date   Alcohol abuse    Anxiety    Back pain    Bipolar disorder ([place])    Bronchitis    COPD exacerbation ([place]) 05/10/2016   Drug use    History of blood clots    Hyperlipidemia    Hypertension    Influenza A 12/01/2018   Joint pain    Left leg DVT ([place]) 09/29/2013   Lipoma    Abdomen   LIPOMA 01/20/2008   Obesity    Occasional tremors 05/12/2019   Osteoarthritis of right knee    Other fatigue    Painful menstrual periods 01/05/2012   Pneumonia    Prediabetes    Seizure ([place])    Shortness of breath on exertion    Stroke ([place])    Tobacco abuse    Past Surgical History:  Procedure Laterality Date   CESAREAN SECTION     CYSTECTOMY     LIPOMA EXCISION  03/2011   ORIF ANKLE FRACTURE  03/13/2012   Procedure: OPEN REDUCTION INTERNAL FIXATION (ORIF) ANKLE FRACTURE;  Surgeon: Jessy Oto, MD;  Location: [facility];  Service: Orthopedics;  Laterality: Right;   TUBAL LIGATION     Patient Active Problem List   Diagnosis Date Noted   Chronic pain of both shoulders 09/05/2022   Right knee pain 07/18/2022   Other hyperlipidemia 05/15/2022   Seborrheic keratoses 02/26/2022   Allergic rhinitis 07/25/2021   Vitamin D deficiency 12/19/2020   Palpitation 12/03/2019   History of intracranial hemorrhage 08/02/2019   History of DVT of lower extremity  08/02/2019   History of seizure 08/02/2019   COPD, moderate ([place]) 12/11/2018   Lumbar back pain 02/11/2018   History of stroke 08/13/2017   Iron deficiency anemia 12/12/2016   Pre-diabetes 03/02/2013   History of pulmonary embolism 05/05/2012   Former tobacco use 08/23/2009   Hyperlipidemia 04/30/2007   Class 3 severe obesity with serious comorbidity and body mass index (BMI) of 50.0 to 59.9 in adult ([place]) 01/01/2007   Essential hypertension 01/01/2007    PCP: Dr Andrena Mews   REFERRING PROVIDER: Dr Debbra Riding   REFERRING DIAG: [phone] (ICD-10-CM) - Right knee pain, unspecified chronicity  THERAPY DIAG:  Chronic pain of right knee  Other abnormalities of gait and mobility  Chronic left shoulder pain  Chronic right shoulder pain  Rationale for Evaluation and Treatment Rehabilitation  ONSET DATE: long standing knee and shoulder pain   SUBJECTIVE:   SUBJECTIVE STATEMENT: The patient returns 2nd to increase pain in her right knee. It started about 2 weeks ago and became more painful over the course of the last 2 weeks.S She feels like she may have tweaked it but can't remember a particular time  PERTINENT HISTORY: Anxiety, Low back pain, COPD, Blood Clots, OA of the right knee; Obesity, Left leg DVT; Seizures; Stroke Tobacco abuse   PAIN:  Are you having pain? Yes: NPRS scale:0/10 current but reaches a 10/10 as the day goes on  Pain location: bilateral knee  Pain description: aching Aggravating factors: pain gets worse as she exercises  Relieving factors: ice and rest     PRECAUTIONS: None  WEIGHT BEARING RESTRICTIONS: No  FALLS:  Has patient fallen in last 6 months? No  LIVING ENVIRONMENT: Step up into the house  OCCUPATION:  Not working   Hobbies:  Walking; reading; going to the gym; going to church, music and signing    PLOF: Independent  PATIENT GOALS:   To have less pain in her knees and shoulders    OBJECTIVE:   DIAGNOSTIC  FINDINGS:  X-ray of the right knee:  IMPRESSION: Moderate to severe medial compartment and mild to moderate patellofemoral compartment osteoarthritis.   PATIENT SURVEYS:  FOTO    COGNITION: Overall cognitive status: Within functional limits for tasks assessed     SENSATION: WFL  EDEMA:    POSTURE: rounded shoulders and forward head Right knee valgus in standing   PALPATION: Limited patella motion   LOWER EXTREMITY ROM:  Active ROM Right Re-eval 3/7 Left eval  Hip flexion    Hip extension    Hip abduction    Hip adduction    Hip internal rotation    Hip external rotation    Knee flexion Pain passed 110 degrees   Knee extension Mild extension limitation    Ankle dorsiflexion    Ankle plantarflexion    Ankle inversion    Ankle eversion     (Blank rows = not tested)  LOWER EXTREMITY MMT:  MMT Right eval Left eval Right   Left   Right  Left   Hip flexion 34.5 24.7 30.1 32.6 46.6 46.5  Hip extension        Hip abduction 45.4 35.2 47.7 42.5 62.0 58.9  Hip adduction        Hip internal rotation        Hip external rotation        Knee flexion        Knee extension 38.9 43.3 50.9 52.7 44.0 57.8  Ankle dorsiflexion        Ankle plantarflexion        Ankle inversion        Ankle eversion         (Blank rows = not tested)  UPPER EXTREMITY MMT:  MMT Right eval Left eval    Shoulder flexion 4/5 3/5 Pain  4+/5 4+/5 mild pain   Shoulder extension      Shoulder abduction      Shoulder adduction      Shoulder extension      Shoulder internal rotation 4/5 with pain  4/5 pain  5/5 5/5  Shoulder external rotation 4+/5 no pain  3+/5 pain  4+/5 4+/5   Middle trapezius      Lower trapezius      Elbow flexion      Elbow extension      Wrist flexion      Wrist extension      Wrist ulnar deviation      Wrist radial deviation      Wrist pronation      Wrist supination      Grip strength       (Blank rows =  not tested)  Palpation: spasming of both upper  traps  Tender to palpation in bipceps groove bilateral L> R   GAIT:  right knee valgus     TODAY'S TREATMENT                                                                           3/7 Reviewed tests and Mesures for the patient  Manual therapy: Trigger point release to VMO, distal IT band, lateral hamstring and gastroc area.  Reviewed self soft tissue mobilization to both areas.  Reviewed home exercises to perform over the next couple days.  Quad set 2 x 10 Straight leg raise x 10 x 8 fatigue with a second set  Seated hamstring stretch 2 x 20-second hold Seated hip abduction 2 x 20 green Reviewed how to use current HEP at home.  12/26 Performed re-assessment on the patient. Reviewed test and measures and goals including strength testing, FOTO, and ROM   Reviewed goals and POC going forward   Bilateral er 2x15 red  Bilateral horizontal abduction 2x15 red  Bilateral flexion  2x15 red   Row machine 2x15 10 lbs  Shoulder extensions 2x15 10 lbs   Lat visit  Pt seen for aquatic therapy today.  Treatment took place in water 3.25-4.5 ft in depth at the [facility]. Temp of water was 92.  Pt entered/exited the pool via stair with hand rail.  UE support on barbell: walking forward, back and side stepping. Gait curing for heel strike and toe off  -forward and backward slow jog -side stepping with increased speed Hip hinging for glut and LB strengthening  LE SLS with opposite oscillations in sagittal and frontal planes 2-3 x 10-12 reps -Leg press sm blue squoodle 3 x 15 s interval fast/slow R/L           Standing : df/pf unsupported ; add/abd x 10. Squats 3.6 ft 2x10 cues for firing target muscles Completed with abdominal bracing Using hand bells: core and hip strengthening working sagittal and frontal plane oscillations sets of 10-12 reps  le staggered  Pt requires the buoyancy and hydrostatic pressure of water for support, and to offload joints by unweighting  joint load by at least 50 % in navel deep water and by at least 75-80% in chest to neck deep water.  Viscosity of the water is needed for resistance of strengthening. Water current perturbations provides challenge to standing balance requiring increased core activation.     11/16 Leg press 25 3x15 Hip abduction machine 3x15  Tips given for adjusting sets and reps depending on soreness levels.   1# [employer] Leg Press 15 lbs 2x15  #6 [employer] Hip Abduction 30 2x15    12# [employer] row 15 lbs 2x15  15# [employer] triceps press down 30 lbs 2x15   Cable Rows 10 lbs 2x10  Cable Extension 10 lbs  2x10     11/9 Manual: Grade 1 and II PA and PA glides for pain; STM to posterior knee and lateral knee. Reviewed self soft tissue mobilization for home  Quad set 3x15   Evaled patients shoulders;   Wand flexion in limited pain free range 3x10   Band er  2x10 red bilateral  Rows 2x10 red reviewed technique  Shoulder extension 2x10     PATIENT EDUCATION:  Education details: EP, symptom management; patella mobility  Person educated: Patient Education method: Explanation, Demonstration, Tactile cues, Verbal cues, and Handouts Education comprehension: verbalized understanding, returned demonstration, verbal cues required, tactile cues required, and needs further education  HOME EXERCISE PROGRAM: [id number]  Aquatics added 10/22/22 [facility]  ASSESSMENT:  CLINICAL IMPRESSION: The patient presents to therapy for re-eval following a long period of self guided exercise in the gym. She has had an acute flair-up of knee pain. She had several large painful trigger points in her quad, IT band and hamstring. She had limited flexion. Following manual therapy to the area she had reduced pain with flexion. We reviewed her trigger points with her for ome soft tissue work. She will continue to work with our trainers at the gym. We reviewed a base exercise program with her to begin. We will also  schedule her for a few pool sessions. She was advised to modify her exercise program at home. We will submit to continue wiorking with the patient 1-2x/W8. She tolerated her base exercises well today.  The patient has a appointment scheduled with trainer following physical therapy she was advised to perform mostly upper body exercises as tolerated.  Therapy took spoke with the trainer.  She reports the only thing that she has been doing that might cause pain would be squats but they will hold on those today. OBJECTIVE IMPAIRMENTS: Abnormal gait, decreased activity tolerance, difficulty walking, decreased ROM, decreased strength, and pain.   ACTIVITY LIMITATIONS: carrying, lifting, bending, sitting, standing, squatting, stairs, transfers, bathing, toileting, dressing, and locomotion level  PARTICIPATION LIMITATIONS: meal prep, cleaning, laundry, shopping, community activity, and yard work  PERSONAL FACTORS: 3+ comorbidities: Anxiety, Low back pain, COPD, Blood Clots, OA of the right knee; Obesity, Left leg DVT; Seizures; Stroke Tobacco abuse  are also affecting patient's functional outcome.   REHAB POTENTIAL: Good  CLINICAL DECISION MAKING: Evolving/moderate complexity increasing pain with activity   EVALUATION COMPLEXITY: Moderate   GOALS: Goals reviewed with patient? Yes  SHORT TERM GOALS: Target date: 09/19/2022  Patient will increase left leg gross strength by 5 lbs  Baseline: See chart above  Goal status: ongoing 3/7   2.  Patient will demonstrate improved patella motion and less popping  Baseline: painfull patella motion  Goal status: ongoing 3/7  3.  Patient will be be able to modify her program when he knee OA flares up  Baseline:  Goal status: ongoing 4. Patient will demonstrate full right knee flexion without pain  Goal status: 3/7    LONG TERM GOALS: Target date: 10/24/2022   Patient will return to a full gym program without significant pain  Baseline:  Goal status:  continuing to progress towards full program 12/26  2.  Patient will have a full pool program  Baseline:  Goal status: should finalize in the next few visits 12/26  3.  Patient will demonstrate equa strength left and right in order to walk in the community without increased stresss on the right leg.  Baseline: achieved. Significant improvements 12/26    PLAN:  PT FREQUENCY: 1-2x/week  PT DURATION: 8 weeks  PLANNED INTERVENTIONS: Therapeutic exercises, Therapeutic activity, Neuromuscular re-education, Balance training, Gait training, Patient/Family education, Self Care, Joint mobilization, Stair training, Aquatic Therapy, Dry Needling, Cryotherapy, Moist heat, Taping, Ultrasound, and Manual therapy  PLAN FOR NEXT SESSION: Work on decreasing trigger points to quad and IT band  and hamstring.  Consider trigger point dry needling if indicated.  Reviewed current home exercise program.  Progress back to gym program as soon as possible.  Carolyne Littles PT DPT  09/24/22 137 PM  Check all possible CPT codes: [id number] - PT Re-evaluation, [phone]- Neuro Re-education, [phone] - Gait Training, [phone] - Manual Therapy, 97530 - Therapeutic Activities, [id number] - Electrical stimulation (unattended), [id number] - Iontophoresis, [id number] - Ultrasound, and [id number] - Aquatic therapy    Check all conditions that are expected to impact treatment: Musculoskeletal disorders   If treatment provided at initial evaluation, no treatment charged due to lack of authorization.

## 2023-01-15 ENCOUNTER — Encounter (INDEPENDENT_AMBULATORY_CARE_PROVIDER_SITE_OTHER): Payer: Self-pay | Admitting: Family Medicine

## 2023-01-15 ENCOUNTER — Other Ambulatory Visit: Payer: Self-pay | Admitting: Family Medicine

## 2023-01-15 ENCOUNTER — Ambulatory Visit (INDEPENDENT_AMBULATORY_CARE_PROVIDER_SITE_OTHER): Payer: Medicaid Other | Admitting: Family Medicine

## 2023-01-15 VITALS — BP 134/86 | HR 79 | Temp 98.6°F | Ht 62.0 in | Wt 290.0 lb

## 2023-01-15 DIAGNOSIS — Z6841 Body Mass Index (BMI) 40.0 and over, adult: Secondary | ICD-10-CM

## 2023-01-15 DIAGNOSIS — R7303 Prediabetes: Secondary | ICD-10-CM | POA: Diagnosis not present

## 2023-01-15 DIAGNOSIS — E669 Obesity, unspecified: Secondary | ICD-10-CM | POA: Diagnosis not present

## 2023-01-15 DIAGNOSIS — J449 Chronic obstructive pulmonary disease, unspecified: Secondary | ICD-10-CM

## 2023-01-15 DIAGNOSIS — E7849 Other hyperlipidemia: Secondary | ICD-10-CM

## 2023-01-15 DIAGNOSIS — I10 Essential (primary) hypertension: Secondary | ICD-10-CM

## 2023-01-15 MED ORDER — METFORMIN HCL 500 MG PO TABS: ORAL_TABLET | ORAL | 0 refills | Status: DC

## 2023-01-17 ENCOUNTER — Ambulatory Visit (INDEPENDENT_AMBULATORY_CARE_PROVIDER_SITE_OTHER): Payer: Medicaid Other | Admitting: Family Medicine

## 2023-01-17 ENCOUNTER — Encounter: Payer: Self-pay | Admitting: Family Medicine

## 2023-01-17 VITALS — BP 133/74 | HR 69 | Ht 62.0 in | Wt 292.4 lb

## 2023-01-17 DIAGNOSIS — Z0000 Encounter for general adult medical examination without abnormal findings: Secondary | ICD-10-CM

## 2023-01-17 DIAGNOSIS — L987 Excessive and redundant skin and subcutaneous tissue: Secondary | ICD-10-CM

## 2023-01-17 NOTE — Progress Notes (Signed)
Subjective:     Carla Hanson is a 57 y.o. female and is here for a comprehensive physical exam. The patient reports no problems. Doing well with her weight management. She is asked about excess skin removal. Now that she is losing weight, she is having excess skin which causes sweating and skin irritation.   Social History   Socioeconomic History   Marital status: Divorced    Spouse name: Not on file   Number of children: 4   Years of education: 10   Highest education level: Not on file  Occupational History   Occupation: stay at home  Tobacco Use   Smoking status: Former    Packs/day: 0.30    Years: 30.00    Additional pack years: 0.00    Total pack years: 9.00    Types: Cigarettes    Start date: 11/04/1981    Quit date: 10/2016    Years since quitting: 6.2   Smokeless tobacco: Never  Vaping Use   Vaping Use: Never used  Substance and Sexual Activity   Alcohol use: No    Alcohol/week: 0.0 standard drinks of alcohol    Comment: Quit 10/2016   Drug use: Yes    Types: Marijuana   Sexual activity: Not Currently    Comment: tubal  Other Topics Concern   Not on file  Social History Narrative   Unemployed, 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  Considering nursing.    Lives at home alone   Right-handed   Caffeine: not much   Social Determinants of Health   Financial Resource Strain: Not on file  Food Insecurity: Not on file  Transportation Needs: Not on file  Physical Activity: Not on file  Stress: Not on file  Social Connections: Not on file  Intimate Partner Violence: Not on file   Health Maintenance  Topic Date Due   MAMMOGRAM  05/30/2022   INFLUENZA VACCINE  01/31/2023 (Originally 06/04/2022)   COVID-19 Vaccine (1) 01/31/2023 (Originally 10/27/1971)   Zoster Vaccines- Shingrix (1 of 2) 02/03/2023 (Originally 10/26/1985)   Fecal DNA (Cologuard)  05/23/2024   PAP SMEAR-Modifier   05/23/2025   DTaP/Tdap/Td (3 - Td or Tdap) 08/12/2027   Hepatitis C Screening  Completed   HIV Screening  Completed   HPV VACCINES  Aged Out    The following portions of the patient's history were reviewed and updated as appropriate: allergies, current medications, past family history, past medical history, past social history, past surgical history, and problem list.  Review of Systems Pertinent items noted in HPI and remainder of comprehensive ROS otherwise negative.   Objective:    BP 133/74   Pulse 69   Ht 5\' 2"  (1.575 m)   Wt 292 lb 6 oz (132.6 kg)   SpO2 99%   BMI 53.48 kg/m  General appearance: alert and cooperative Head: Normocephalic, without obvious abnormality, atraumatic Eyes: conjunctivae/corneas clear. PERRL, EOM's intact. Fundi benign. Ears: normal TM's and external ear canals both ears Throat: lips, mucosa, and tongue normal; teeth and gums normal Lungs: clear to auscultation bilaterally Heart: regular rate and rhythm, S1, S2 normal, no murmur, click, rub or gallop Abdomen: soft, non-tender; bowel sounds normal; no masses,  no organomegaly Extremities: extremities normal, atraumatic, no cyanosis or edema Pulses: 2+ and symmetric Lymph nodes: Cervical, supraclavicular, and axillary nodes normal. Neurologic: Alert and oriented X 3, normal strength and tone. Normal symmetric reflexes. Normal coordination  and gait    Assessment:    Healthy female exam. Normal exam     Plan:  She is up to date with PAP and colon cancer screening with cologuard.  Mammogram scheduled. She does not qualify for lung cancer screening. I did mentioned to her that she is however at risk for lung cancer given her smoking hx.  Lung Cancer screen as needed in the future. Immunization record reviewed.  She declined COVID-19 and influenza vaccination Referred to plastic surgeon for excess skin removal.  See After Visit Summary for Counseling Recommendations  Andrena Mews, MD [place]

## 2023-01-17 NOTE — Patient Instructions (Signed)
Preventive Care 40-57 Years Old, Female Preventive care refers to lifestyle choices and visits with your health care provider that can promote health and wellness. Preventive care visits are also called wellness exams. What can I expect for my preventive care visit? Counseling Your health care provider may ask you questions about your: Medical history, including: Past medical problems. Family medical history. Pregnancy history. Current health, including: Menstrual cycle. Method of birth control. Emotional well-being. Home life and relationship well-being. Sexual activity and sexual health. Lifestyle, including: Alcohol, nicotine or tobacco, and drug use. Access to firearms. Diet, exercise, and sleep habits. Work and work environment. Sunscreen use. Safety issues such as seatbelt and bike helmet use. Physical exam Your health care provider will check your: Height and weight. These may be used to calculate your BMI (body mass index). BMI is a measurement that tells if you are at a healthy weight. Waist circumference. This measures the distance around your waistline. This measurement also tells if you are at a healthy weight and may help predict your risk of certain diseases, such as type 2 diabetes and high blood pressure. Heart rate and blood pressure. Body temperature. Skin for abnormal spots. What immunizations do I need?  Vaccines are usually given at various ages, according to a schedule. Your health care provider will recommend vaccines for you based on your age, medical history, and lifestyle or other factors, such as travel or where you work. What tests do I need? Screening Your health care provider may recommend screening tests for certain conditions. This may include: Lipid and cholesterol levels. Diabetes screening. This is done by checking your blood sugar (glucose) after you have not eaten for a while (fasting). Pelvic exam and Pap test. Hepatitis B test. Hepatitis C  test. HIV (human immunodeficiency virus) test. STI (sexually transmitted infection) testing, if you are at risk. Lung cancer screening. Colorectal cancer screening. Mammogram. Talk with your health care provider about when you should start having regular mammograms. This may depend on whether you have a family history of breast cancer. BRCA-related cancer screening. This may be done if you have a family history of breast, ovarian, tubal, or peritoneal cancers. Bone density scan. This is done to screen for osteoporosis. Talk with your health care provider about your test results, treatment options, and if necessary, the need for more tests. Follow these instructions at home: Eating and drinking  Eat a diet that includes fresh fruits and vegetables, whole grains, lean protein, and low-fat dairy products. Take vitamin and mineral supplements as recommended by your health care provider. Do not drink alcohol if: Your health care provider tells you not to drink. You are pregnant, may be pregnant, or are planning to become pregnant. If you drink alcohol: Limit how much you have to 0-1 drink a day. Know how much alcohol is in your drink. In the [place] one drink equals one 12 oz bottle of beer (355 mL), one 5 oz glass of wine (148 mL), or one 1 oz glass of hard liquor (44 mL). Lifestyle Brush your teeth every morning and night with fluoride toothpaste. Floss one time each day. Exercise for at least 30 minutes 5 or more days each week. Do not use any products that contain nicotine or tobacco. These products include cigarettes, chewing tobacco, and vaping devices, such as e-cigarettes. If you need help quitting, ask your health care provider. Do not use drugs. If you are sexually active, practice safe sex. Use a condom or other form of protection to   prevent STIs. If you do not wish to become pregnant, use a form of birth control. If you plan to become pregnant, see your health care provider for a  prepregnancy visit. Take aspirin only as told by your health care provider. Make sure that you understand how much to take and what form to take. Work with your health care provider to find out whether it is safe and beneficial for you to take aspirin daily. Find healthy ways to manage stress, such as: Meditation, yoga, or listening to music. Journaling. Talking to a trusted person. Spending time with friends and family. Minimize exposure to UV radiation to reduce your risk of skin cancer. Safety Always wear your seat belt while driving or riding in a vehicle. Do not drive: If you have been drinking alcohol. Do not ride with someone who has been drinking. When you are tired or distracted. While texting. If you have been using any mind-altering substances or drugs. Wear a helmet and other protective equipment during sports activities. If you have firearms in your house, make sure you follow all gun safety procedures. Seek help if you have been physically or sexually abused. What's next? Visit your health care provider once a year for an annual wellness visit. Ask your health care provider how often you should have your eyes and teeth checked. Stay up to date on all vaccines. This information is not intended to replace advice given to you by your health care provider. Make sure you discuss any questions you have with your health care provider. Document Revised: 04/18/2021 Document Reviewed: 04/18/2021 Elsevier Patient Education  2023 [employer]  

## 2023-01-21 ENCOUNTER — Encounter (INDEPENDENT_AMBULATORY_CARE_PROVIDER_SITE_OTHER): Payer: Self-pay | Admitting: Family Medicine

## 2023-01-22 NOTE — Progress Notes (Unsigned)
Chief Complaint:   OBESITY Carla Hanson is here to discuss her progress with her obesity treatment plan along with follow-up of her obesity related diagnoses. Carla Hanson is on keeping a food journal and adhering to recommended goals of 1200-1600 calories and 85 grams of protein and states she is following her eating plan approximately 90% of the time. Carla Hanson states she is lifting weights and doing cardio for 30-60 minutes 5 times per week.  Today's visit was #: 91 Starting weight: 324 lbs Starting date: 01/03/2021 Today's weight: 290 lbs Today's date: 01/15/2023 Total lbs lost to date: 34 Total lbs lost since last in-office visit: 0  Interim History: Carla Hanson has been working on her diet and exercise, but she is struggling to follow her low carbohydrate plan as strictly as needed to cause weight gain.  Subjective:   1. Essential hypertension Carla Hanson's blood pressure is controlled with her medications.  She denies chest pain, headache, or dizziness.  2. Pre-diabetes Carla Hanson is doing well on metformin.  She is still struggling with decreasing simple carbohydrates.  I discussed labs with the patient today.  3. Other hyperlipidemia Carla Hanson is cholesterol is at goal on Crestor.  She continues to work on her diet and weight loss, and would like to be able to continue with decreasing medications eventually.  I discussed labs with the patient today.  Assessment/Plan:   1. Essential hypertension Carla Hanson was encouraged to continue with her weight loss to help control her blood pressure.  2. Pre-diabetes We will refill metformin for 1 month.  Simple carbohydrate recipes and meal planning strategies were discussed in depth today.  - metFORMIN (GLUCOPHAGE) 500 MG tablet; TAKE 1 TABLET(500 MG) BY MOUTH THREE TIMES DAILY WITH MEALS  Dispense: 90 tablet; Refill: 0  3. Other hyperlipidemia Carla Hanson will continue Crestor for now as well as her diet and weight loss.  4. BMI 50.0-59.9, adult ([place])  5.  Obesity, Beginning BMI 57.4 Carla Hanson is currently in the action stage of change. As such, her goal is to continue with weight loss efforts. She has agreed to following a lower carbohydrate, vegetable and lean protein rich diet plan.   Exercise goals: As is.   Behavioral modification strategies: decreasing simple carbohydrates, increasing vegetables, and meal planning and cooking strategies.  Carla Hanson has agreed to follow-up with our clinic in 4 weeks. She was informed of the importance of frequent follow-up visits to maximize her success with intensive lifestyle modifications for her multiple health conditions.   Objective:   Blood pressure 134/86, pulse 79, temperature 98.6 F (37 C), height 5\' 2"  (1.575 m), weight 290 lb (131.5 kg), SpO2 98 %. Body mass index is 53.04 kg/m.  Lab Results  Component Value Date   CREATININE 0.53 (L) 12/18/2022   BUN 13 12/18/2022   NA 142 12/18/2022   K 4.1 12/18/2022   CL 103 12/18/2022   CO2 25 12/18/2022   Lab Results  Component Value Date   ALT 14 12/18/2022   AST 14 12/18/2022   ALKPHOS 48 12/18/2022   BILITOT 0.3 12/18/2022   Lab Results  Component Value Date   HGBA1C 6.1 (H) 12/18/2022   HGBA1C 6.3 (H) 07/22/2022   HGBA1C 6.1 (H) 03/04/2022   HGBA1C 6.2 (H) 11/20/2021   HGBA1C 6.3 (H) 05/21/2021   Lab Results  Component Value Date   INSULIN 48.0 (H) 12/18/2022   INSULIN 62.0 (H) 07/22/2022   INSULIN 81.0 (H) 03/04/2022   INSULIN 60.8 (H) 11/20/2021   INSULIN 64.3 (H)  05/21/2021   Lab Results  Component Value Date   TSH 1.630 07/22/2022   Lab Results  Component Value Date   CHOL 145 12/18/2022   HDL 50 12/18/2022   LDLCALC 79 12/18/2022   TRIG 83 12/18/2022   CHOLHDL 5.7 (H) 05/15/2021   Lab Results  Component Value Date   VD25OH 56.8 12/18/2022   VD25OH 44.6 07/22/2022   VD25OH 57.1 03/04/2022   Lab Results  Component Value Date   WBC 7.1 07/22/2022   HGB 12.7 07/22/2022   HCT 39.9 07/22/2022   MCV 81  07/22/2022   PLT 314 07/22/2022   Lab Results  Component Value Date   IRON 32 12/03/2016   TIBC 363 12/03/2016   FERRITIN 13 (L) 02/09/2018   Attestation Statements:   Reviewed by clinician on day of visit: allergies, medications, problem list, medical history, surgical history, family history, social history, and previous encounter notes.   I, Trixie Dredge, am acting as transcriptionist for Dennard Nip, MD.  I have reviewed the above documentation for accuracy and completeness, and I agree with the above. -  Dennard Nip, MD

## 2023-01-27 ENCOUNTER — Encounter: Payer: Self-pay | Admitting: Family Medicine

## 2023-01-27 ENCOUNTER — Other Ambulatory Visit: Payer: Self-pay | Admitting: Family Medicine

## 2023-01-27 ENCOUNTER — Other Ambulatory Visit (INDEPENDENT_AMBULATORY_CARE_PROVIDER_SITE_OTHER): Payer: Self-pay | Admitting: Family Medicine

## 2023-01-27 DIAGNOSIS — I10 Essential (primary) hypertension: Secondary | ICD-10-CM

## 2023-01-27 DIAGNOSIS — R7303 Prediabetes: Secondary | ICD-10-CM

## 2023-01-28 ENCOUNTER — Ambulatory Visit (HOSPITAL_BASED_OUTPATIENT_CLINIC_OR_DEPARTMENT_OTHER): Payer: Medicaid Other | Admitting: Physical Therapy

## 2023-01-28 ENCOUNTER — Encounter (HOSPITAL_BASED_OUTPATIENT_CLINIC_OR_DEPARTMENT_OTHER): Payer: Self-pay | Admitting: Physical Therapy

## 2023-01-28 ENCOUNTER — Other Ambulatory Visit: Payer: Self-pay | Admitting: Family Medicine

## 2023-01-28 DIAGNOSIS — I10 Essential (primary) hypertension: Secondary | ICD-10-CM

## 2023-01-28 DIAGNOSIS — G8929 Other chronic pain: Secondary | ICD-10-CM

## 2023-01-28 DIAGNOSIS — R2689 Other abnormalities of gait and mobility: Secondary | ICD-10-CM

## 2023-01-28 DIAGNOSIS — M25561 Pain in right knee: Secondary | ICD-10-CM | POA: Diagnosis not present

## 2023-01-28 MED ORDER — HYDROCHLOROTHIAZIDE 25 MG PO TABS: 25.0000 mg | ORAL_TABLET | Freq: Every day | ORAL | 1 refills | Status: DC

## 2023-01-28 NOTE — Progress Notes (Addendum)
TeleHealth Visit:  This visit was completed with telemedicine (audio/video) technology. Carla Hanson has verbally consented to this TeleHealth visit. The patient is located at home, the provider is located at home. The participants in this visit include the listed provider and patient. The visit was conducted today via MyChart video.  OBESITY Carla Hanson is here to discuss her progress with her obesity treatment plan along with follow-up of her obesity related diagnoses.   Today's visit was # 39 Starting weight: 324 lbs Starting date: 01/03/2021 Weight at last in office visit: 290 lbs on 01/15/23 Total weight loss: 34 lbs at last in office visit on 01/15/23. Today's reported weight: none reported  Nutrition Plan: low carbohydrate plan - 80-85% adherence.  Current exercise:  lifting weights and doing cardio for 30-60 minutes 5 times per week plus PT for knee  Interim History:  Switched to low carb plan last OV.  Reports 80-85% adherence.  She missed yogurt and milk. Trying to get 30 gm of protein each meal.  She struggles to eat sometimes- lacks hunger.   Typical day of food: Breakfast - 2 Eggs and [place] bacon Lunch- chili, slices of peppers Dinner -  chicken and vegetable  Protein intake adequate: yes - most of the time. Estimates 65-75 grams. Skipping meals: occasionally Drinking adequate water: Yes Drinking sugar sweetened beverages: No Hunger controlled: well controlled. Cravings controlled:  moderately controlled.   Pharmacotherapy: Carla Hanson is on  metformin 500 mg 3 times daily with meals. Adverse side effects: None Assessment/Plan:  1. Prediabetes Last A1c was 6.1 on 12/18/2022, down from 6.3. Medication(s):  Metformin 3 times daily with meals Lab Results  Component Value Date   HGBA1C 6.1 (H) 12/18/2022   HGBA1C 6.3 (H) 07/22/2022   HGBA1C 6.1 (H) 03/04/2022   HGBA1C 6.2 (H) 11/20/2021   HGBA1C 6.3 (H) 05/21/2021   Lab Results  Component Value Date   INSULIN 48.0  (H) 12/18/2022   INSULIN 62.0 (H) 07/22/2022   INSULIN 81.0 (H) 03/04/2022   INSULIN 60.8 (H) 11/20/2021   INSULIN 64.3 (H) 05/21/2021    Plan: Continue metformin 3 times daily with meals   2.   Morbid Obesity: Current BMI 53 Pharmacotherapy Plan Continue   metformin 3 times daily with meals. Carla Hanson is currently in the action stage of change. As such, her goal is to continue with weight loss efforts.  She has agreed to low carbohydrate plan and Other may have 1 Two Good yogurt daily and 1 low-carb 50-calorie wrap daily. .  Exercise goals:  as is  Behavioral modification strategies: increasing lean protein intake, meal planning , and planning for success.  Carla Hanson has agreed to follow-up with our clinic in 3 weeks.   No orders of the defined types were placed in this encounter.   Medications Discontinued During This Encounter  Medication Reason   albuterol (PROVENTIL) (2.5 MG/3ML) 0.083% nebulizer solution Patient Preference   VENTOLIN HFA 108 (90 Base) MCG/ACT inhaler Patient Preference     No orders of the defined types were placed in this encounter.     Objective:   VITALS: Per patient if applicable, see vitals. GENERAL: Alert and in no acute distress. CARDIOPULMONARY: No increased WOB. Speaking in clear sentences.  PSYCH: Pleasant and cooperative. Speech normal rate and rhythm. Affect is appropriate. Insight and judgement are appropriate. Attention is focused, linear, and appropriate.  NEURO: Oriented as arrived to appointment on time with no prompting.   Attestation Statements:   Reviewed by clinician on day of visit:  allergies, medications, problem list, medical history, surgical history, family history, social history, and previous encounter notes.  Time spent on visit including the items listed below was 30 minutes.  -preparing to see the patient (e.g., review of tests, history, previous notes) -obtaining and/or reviewing separately obtained history -counseling  and educating the patient/family/caregiver -documenting clinical information in the electronic or other health record -ordering medications, tests, or procedures -independently interpreting results and communicating results to the patient/ family/caregiver -referring and communicating with other health care professionals  -care coordination   This was prepared with the assistance of Engineer, civil (consulting)[employer].  Occasional wrong-word or sound-a-like substitutions may have occurred due to the inherent limitations of voice recognition software.

## 2023-01-28 NOTE — Therapy (Signed)
OUTPATIENT PHYSICAL THERAPY LOWER EXTREMITY Re-eval   Patient Name: FREYA RADERMACHER MRN: [id number] DOB:11/19/1965, 57 y.o., female Today's Date: 01/28/2023   PT End of Session - 01/28/23 0954     Visit Number 2    Number of Visits 16    Date for PT Re-Evaluation 03/06/23    PT Start Time 0950    PT Stop Time 1030    PT Time Calculation (min) 40 min    Activity Tolerance Patient tolerated treatment well    Behavior During Therapy [facility] for tasks assessed/performed                Past Medical History:  Diagnosis Date   Alcohol abuse    Anxiety    Back pain    Bipolar disorder ([place])    Bronchitis    COPD exacerbation ([place]) 05/10/2016   Drug use    History of blood clots    Hyperlipidemia    Hypertension    Influenza A 12/01/2018   Joint pain    Left leg DVT ([place]) 09/29/2013   Lipoma    Abdomen   LIPOMA 01/20/2008   Obesity    Occasional tremors 05/12/2019   Osteoarthritis of right knee    Other fatigue    Painful menstrual periods 01/05/2012   Pneumonia    Prediabetes    Seizure ([place])    Shortness of breath on exertion    Stroke ([place])    Tobacco abuse    Past Surgical History:  Procedure Laterality Date   CESAREAN SECTION     CYSTECTOMY     LIPOMA EXCISION  03/2011   ORIF ANKLE FRACTURE  03/13/2012   Procedure: OPEN REDUCTION INTERNAL FIXATION (ORIF) ANKLE FRACTURE;  Surgeon: Jessy Oto, MD;  Location: [facility];  Service: Orthopedics;  Laterality: Right;   TUBAL LIGATION     Patient Active Problem List   Diagnosis Date Noted   Chronic pain of both shoulders 09/05/2022   Right knee pain 07/18/2022   Other hyperlipidemia 05/15/2022   Seborrheic keratoses 02/26/2022   Allergic rhinitis 07/25/2021   Vitamin D deficiency 12/19/2020   Palpitation 12/03/2019   History of intracranial hemorrhage 08/02/2019   History of DVT of lower extremity 08/02/2019   History of seizure 08/02/2019   COPD, moderate ([place]) 12/11/2018   Lumbar back pain 02/11/2018   History  of stroke 08/13/2017   Iron deficiency anemia 12/12/2016   Pre-diabetes 03/02/2013   History of pulmonary embolism 05/05/2012   Former tobacco use 08/23/2009   Hyperlipidemia 04/30/2007   Morbid obesity ([place]) 01/01/2007   Essential hypertension 01/01/2007    PCP: Dr Andrena Mews   REFERRING PROVIDER: Dr Debbra Riding   REFERRING DIAG: [phone] (ICD-10-CM) - Right knee pain, unspecified chronicity  THERAPY DIAG:  Chronic pain of right knee  Other abnormalities of gait and mobility  Rationale for Evaluation and Treatment Rehabilitation  ONSET DATE: long standing knee and shoulder pain   SUBJECTIVE:   SUBJECTIVE STATEMENT: "I am tired today. . My right knee is really hurting"   PERTINENT HISTORY: Anxiety, Low back pain, COPD, Blood Clots, OA of the right knee; Obesity, Left leg DVT; Seizures; Stroke Tobacco abuse   PAIN:  Are you having pain? Yes: NPRS scale:8/10 current but reaches a 10/10 as the day goes on  Pain location: bilateral knee  Pain description: aching Aggravating factors: pain gets worse as she exercises  Relieving factors: ice and rest     PRECAUTIONS: None  WEIGHT BEARING RESTRICTIONS:  No  FALLS:  Has patient fallen in last 6 months? No  LIVING ENVIRONMENT: Step up into the house  OCCUPATION:  Not working   Hobbies:  Walking; reading; going to the gym; going to church, music and signing    PLOF: Independent  PATIENT GOALS:   To have less pain in her knees and shoulders    OBJECTIVE:   DIAGNOSTIC FINDINGS:  X-ray of the right knee:  IMPRESSION: Moderate to severe medial compartment and mild to moderate patellofemoral compartment osteoarthritis.   PATIENT SURVEYS:  FOTO    COGNITION: Overall cognitive status: Within functional limits for tasks assessed     SENSATION: WFL  EDEMA:    POSTURE: rounded shoulders and forward head Right knee valgus in standing   PALPATION: Limited patella motion   LOWER EXTREMITY  ROM:  Active ROM Right Re-eval 3/7 Left eval  Hip flexion    Hip extension    Hip abduction    Hip adduction    Hip internal rotation    Hip external rotation    Knee flexion Pain passed 110 degrees   Knee extension Mild extension limitation    Ankle dorsiflexion    Ankle plantarflexion    Ankle inversion    Ankle eversion     (Blank rows = not tested)  LOWER EXTREMITY MMT:  MMT Right eval Left eval Right   Left   Right  Left   Hip flexion 34.5 24.7 30.1 32.6 46.6 46.5  Hip extension        Hip abduction 45.4 35.2 47.7 42.5 62.0 58.9  Hip adduction        Hip internal rotation        Hip external rotation        Knee flexion        Knee extension 38.9 43.3 50.9 52.7 44.0 57.8  Ankle dorsiflexion        Ankle plantarflexion        Ankle inversion        Ankle eversion         (Blank rows = not tested)  UPPER EXTREMITY MMT:  MMT Right eval Left eval    Shoulder flexion 4/5 3/5 Pain  4+/5 4+/5 mild pain   Shoulder extension      Shoulder abduction      Shoulder adduction      Shoulder extension      Shoulder internal rotation 4/5 with pain  4/5 pain  5/5 5/5  Shoulder external rotation 4+/5 no pain  3+/5 pain  4+/5 4+/5   Middle trapezius      Lower trapezius      Elbow flexion      Elbow extension      Wrist flexion      Wrist extension      Wrist ulnar deviation      Wrist radial deviation      Wrist pronation      Wrist supination      Grip strength       (Blank rows = not tested)  Palpation: spasming of both upper traps  Tender to palpation in bipceps groove bilateral L> R   GAIT:  right knee valgus     TODAY'S TREATMENT  01/28/23 Pt seen for aquatic therapy today.  Treatment took place in water 3.5-4.75 ft in depth at the [facility] pool. Temp of water was 91.  Pt entered/exited the pool via stairs with hand rail.  *walking forward, back and side  stepping Yellow HB submerged at sides for core engagement forward and backwrad amb Side lunges with ue add/abd yellow HB x 4 widths. Step ups leading R/L x10-12 bottom step with ue support hand rails. gastroc, hamstring and quad stretching R/L on steps UE support wall: df; pf; marching; add/abd; hip extension; squats x12 Cycling on yellow noodle  Pt requires the buoyancy and hydrostatic pressure of water for support, and to offload joints by unweighting joint load by at least 50 % in navel deep water and by at least 75-80% in chest to neck deep water.  Viscosity of the water is needed for resistance of strengthening. Water current perturbations provides challenge to standing balance requiring increased core activation.     3/7 Reviewed tests and Mesures for the patient  Manual therapy: Trigger point release to VMO, distal IT band, lateral hamstring and gastroc area.  Reviewed self soft tissue mobilization to both areas.  Reviewed home exercises to perform over the next couple days.  Quad set 2 x 10 Straight leg raise x 10 x 8 fatigue with a second set  Seated hamstring stretch 2 x 20-second hold Seated hip abduction 2 x 20 green Reviewed how to use current HEP at home.  12/26 Performed re-assessment on the patient. Reviewed test and measures and goals including strength testing, FOTO, and ROM   Reviewed goals and POC going forward   Bilateral er 2x15 red  Bilateral horizontal abduction 2x15 red  Bilateral flexion  2x15 red   Row machine 2x15 10 lbs  Shoulder extensions 2x15 10 lbs   Lat visit  Pt seen for aquatic therapy today.  Treatment took place in water 3.25-4.5 ft in depth at the [facility]. Temp of water was 92.  Pt entered/exited the pool via stair with hand rail.  UE support on barbell: walking forward, back and side stepping. Gait curing for heel strike and toe off  -forward and backward slow jog -side stepping with increased speed Hip hinging  for glut and LB strengthening  LE SLS with opposite oscillations in sagittal and frontal planes 2-3 x 10-12 reps -Leg press sm blue squoodle 3 x 15 s interval fast/slow R/L           Standing : df/pf unsupported ; add/abd x 10. Squats 3.6 ft 2x10 cues for firing target muscles Completed with abdominal bracing Using hand bells: core and hip strengthening working sagittal and frontal plane oscillations sets of 10-12 reps  le staggered  Pt requires the buoyancy and hydrostatic pressure of water for support, and to offload joints by unweighting joint load by at least 50 % in navel deep water and by at least 75-80% in chest to neck deep water.  Viscosity of the water is needed for resistance of strengthening. Water current perturbations provides challenge to standing balance requiring increased core activation.     11/16 Leg press 25 3x15 Hip abduction machine 3x15  Tips given for adjusting sets and reps depending on soreness levels.   1# [employer] Leg Press 15 lbs 2x15  #6 [employer] Hip Abduction 30 2x15    12# [employer] row 15 lbs 2x15  15# [employer] triceps press down 30 lbs 2x15   Cable Rows 10  lbs 2x10  Cable Extension 10 lbs  2x10     11/9 Manual: Grade 1 and II PA and PA glides for pain; STM to posterior knee and lateral knee. Reviewed self soft tissue mobilization for home  Quad set 3x15   Evaled patients shoulders;   Wand flexion in limited pain free range 3x10   Band er 2x10 red bilateral  Rows 2x10 red reviewed technique  Shoulder extension 2x10     PATIENT EDUCATION:  Education details: EP, symptom management; patella mobility  Person educated: Patient Education method: Explanation, Demonstration, Tactile cues, Verbal cues, and Handouts Education comprehension: verbalized understanding, returned demonstration, verbal cues required, tactile cues required, and needs further education  HOME EXERCISE PROGRAM: [id number]  Aquatics added 10/22/22  FZ  01/28/23: updated aquatic HEP Aquatic home exercise program from [place] utilizes pictures from land based exercises, but has been adapted prior to lamination and issuance.  FZ  ASSESSMENT:  CLINICAL IMPRESSION: Pt recently DC from aquatic therapy in 12/24.  She reports compliance with HEP land based but has had an increase in knee pain. She is re-introduced to [employer].  She reports pain free movement within 10-15 minutes into session from 8/10 entering into session. Pt edu on use of properties of water to better manage her chronic knee pain.  She is directed through exercises she completed last episode without difficutly. I have updated her aquatic HEP.  Plan to instructed and issue going forward for pt to complete indep.   The patient presents to therapy for re-eval following a long period of self guided exercise in the gym. She has had an acute flair-up of knee pain. She had several large painful trigger points in her quad, IT band and hamstring. She had limited flexion. Following manual therapy to the area she had reduced pain with flexion. We reviewed her trigger points with her for ome soft tissue work. She will continue to work with our trainers at the gym. We reviewed a base exercise program with her to begin. We will also schedule her for a few pool sessions. She was advised to modify her exercise program at home. We will submit to continue wiorking with the patient 1-2x/W8. She tolerated her base exercises well today.  The patient has a appointment scheduled with trainer following physical therapy she was advised to perform mostly upper body exercises as tolerated.  Therapy took spoke with the trainer.  She reports the only thing that she has been doing that might cause pain would be squats but they will hold on those today. OBJECTIVE IMPAIRMENTS: Abnormal gait, decreased activity tolerance, difficulty walking, decreased ROM, decreased strength, and pain.   ACTIVITY LIMITATIONS:  carrying, lifting, bending, sitting, standing, squatting, stairs, transfers, bathing, toileting, dressing, and locomotion level  PARTICIPATION LIMITATIONS: meal prep, cleaning, laundry, shopping, community activity, and yard work  PERSONAL FACTORS: 3+ comorbidities: Anxiety, Low back pain, COPD, Blood Clots, OA of the right knee; Obesity, Left leg DVT; Seizures; Stroke Tobacco abuse  are also affecting patient's functional outcome.   REHAB POTENTIAL: Good  CLINICAL DECISION MAKING: Evolving/moderate complexity increasing pain with activity   EVALUATION COMPLEXITY: Moderate   GOALS: Goals reviewed with patient? Yes  SHORT TERM GOALS: Target date: 09/19/2022  Patient will increase left leg gross strength by 5 lbs  Baseline: See chart above  Goal status: ongoing 3/7   2.  Patient will demonstrate improved patella motion and less popping  Baseline: painfull patella motion  Goal status: ongoing 3/7  3.  Patient  will be be able to modify her program when he knee OA flares up  Baseline:  Goal status: ongoing 4. Patient will demonstrate full right knee flexion without pain  Goal status: 3/7    LONG TERM GOALS: Target date: 10/24/2022   Patient will return to a full gym program without significant pain  Baseline:  Goal status: continuing to progress towards full program 12/26  2.  Patient will have a full pool program  Baseline:  Goal status: should finalize in the next few visits 12/26  3.  Patient will demonstrate equa strength left and right in order to walk in the community without increased stresss on the right leg.  Baseline: achieved. Significant improvements 12/26    PLAN:  PT FREQUENCY: 1-2x/week  PT DURATION: 8 weeks  PLANNED INTERVENTIONS: Therapeutic exercises, Therapeutic activity, Neuromuscular re-education, Balance training, Gait training, Patient/Family education, Self Care, Joint mobilization, Stair training, Aquatic Therapy, Dry Needling, Cryotherapy,  Moist heat, Taping, Ultrasound, and Manual therapy  PLAN FOR NEXT SESSION: Work on decreasing trigger points to quad and IT band and hamstring.  Consider trigger point dry needling if indicated.  Reviewed current home exercise program.  Progress back to gym program as soon as possible.  Stanton Kidney Tharon Aquas) Maxten Shuler MPT 01/28/23 1043  Check all possible CPT codes: [id number] - PT Re-evaluation, 97112- Neuro Re-education, [phone] - Gait Training, [phone] - Manual Therapy, 97530 - Therapeutic Activities, [id number] - Electrical stimulation (unattended), [id number] - Iontophoresis, [id number] - Ultrasound, and [id number] - Aquatic therapy    Check all conditions that are expected to impact treatment: Musculoskeletal disorders   If treatment provided at initial evaluation, no treatment charged due to lack of authorization.

## 2023-01-29 ENCOUNTER — Encounter (INDEPENDENT_AMBULATORY_CARE_PROVIDER_SITE_OTHER): Payer: Self-pay | Admitting: Family Medicine

## 2023-01-29 ENCOUNTER — Telehealth (INDEPENDENT_AMBULATORY_CARE_PROVIDER_SITE_OTHER): Payer: Medicaid Other | Admitting: Family Medicine

## 2023-01-29 DIAGNOSIS — Z6841 Body Mass Index (BMI) 40.0 and over, adult: Secondary | ICD-10-CM

## 2023-01-29 DIAGNOSIS — R7303 Prediabetes: Secondary | ICD-10-CM

## 2023-02-04 ENCOUNTER — Encounter (HOSPITAL_BASED_OUTPATIENT_CLINIC_OR_DEPARTMENT_OTHER): Payer: Self-pay

## 2023-02-04 ENCOUNTER — Ambulatory Visit (HOSPITAL_BASED_OUTPATIENT_CLINIC_OR_DEPARTMENT_OTHER): Payer: Medicaid Other | Admitting: Physical Therapy

## 2023-02-04 ENCOUNTER — Telehealth (INDEPENDENT_AMBULATORY_CARE_PROVIDER_SITE_OTHER): Payer: Self-pay | Admitting: Family Medicine

## 2023-02-04 NOTE — Telephone Encounter (Signed)
Patient was called to reschedule an appointment on 4/15 due to [facility] being out of office. Patient stated she

## 2023-02-07 ENCOUNTER — Encounter (HOSPITAL_BASED_OUTPATIENT_CLINIC_OR_DEPARTMENT_OTHER): Payer: Self-pay | Admitting: Physical Therapy

## 2023-02-07 ENCOUNTER — Ambulatory Visit (HOSPITAL_BASED_OUTPATIENT_CLINIC_OR_DEPARTMENT_OTHER): Payer: Medicaid Other | Attending: Family Medicine | Admitting: Physical Therapy

## 2023-02-07 ENCOUNTER — Ambulatory Visit
Admission: RE | Admit: 2023-02-07 | Discharge: 2023-02-07 | Disposition: A | Discharge status: Home / Self Care | Payer: Medicaid Other | Source: Ambulatory Visit

## 2023-02-07 DIAGNOSIS — M25511 Pain in right shoulder: Secondary | ICD-10-CM | POA: Diagnosis present

## 2023-02-07 DIAGNOSIS — M25561 Pain in right knee: Secondary | ICD-10-CM | POA: Diagnosis present

## 2023-02-07 DIAGNOSIS — R2689 Other abnormalities of gait and mobility: Secondary | ICD-10-CM | POA: Diagnosis present

## 2023-02-07 DIAGNOSIS — G8929 Other chronic pain: Secondary | ICD-10-CM | POA: Diagnosis present

## 2023-02-07 DIAGNOSIS — Z1231 Encounter for screening mammogram for malignant neoplasm of breast: Secondary | ICD-10-CM | POA: Diagnosis not present

## 2023-02-07 DIAGNOSIS — M25512 Pain in left shoulder: Secondary | ICD-10-CM | POA: Insufficient documentation

## 2023-02-07 NOTE — Therapy (Signed)
OUTPATIENT PHYSICAL THERAPY LOWER EXTREMITY Re-eval   Patient Name: Carla Hanson MRN: [id number] DOB:30-Jan-1966, 57 y.o., female Today's Date: 01/28/2023   PT End of Session - 01/28/23 0954     Visit Number 2    Number of Visits 16    Date for PT Re-Evaluation 03/06/23    PT Start Time 0950    PT Stop Time 1030    PT Time Calculation (min) 40 min    Activity Tolerance Patient tolerated treatment well    Behavior During Therapy [facility] for tasks assessed/performed                Past Medical History:  Diagnosis Date   Alcohol abuse    Anxiety    Back pain    Bipolar disorder (HCC)    Bronchitis    COPD exacerbation (HCC) 05/10/2016   Drug use    History of blood clots    Hyperlipidemia    Hypertension    Influenza A 12/01/2018   Joint pain    Left leg DVT (HCC) 09/29/2013   Lipoma    Abdomen   LIPOMA 01/20/2008   Obesity    Occasional tremors 05/12/2019   Osteoarthritis of right knee    Other fatigue    Painful menstrual periods 01/05/2012   Pneumonia    Prediabetes    Seizure (HCC)    Shortness of breath on exertion    Stroke (HCC)    Tobacco abuse    Past Surgical History:  Procedure Laterality Date   CESAREAN SECTION     CYSTECTOMY     LIPOMA EXCISION  03/2011   ORIF ANKLE FRACTURE  03/13/2012   Procedure: OPEN REDUCTION INTERNAL FIXATION (ORIF) ANKLE FRACTURE;  Surgeon: Kerrin Champagne, MD;  Location: [facility];  Service: Orthopedics;  Laterality: Right;   TUBAL LIGATION     Patient Active Problem List   Diagnosis Date Noted   Chronic pain of both shoulders 09/05/2022   Right knee pain 07/18/2022   Other hyperlipidemia 05/15/2022   Seborrheic keratoses 02/26/2022   Allergic rhinitis 07/25/2021   Vitamin D deficiency 12/19/2020   Palpitation 12/03/2019   History of intracranial hemorrhage 08/02/2019   History of DVT of lower extremity 08/02/2019   History of seizure 08/02/2019   COPD, moderate (HCC) 12/11/2018   Lumbar back pain 02/11/2018   History  of stroke 08/13/2017   Iron deficiency anemia 12/12/2016   Pre-diabetes 03/02/2013   History of pulmonary embolism 05/05/2012   Former tobacco use 08/23/2009   Hyperlipidemia 04/30/2007   Morbid obesity (HCC) 01/01/2007   Essential hypertension 01/01/2007    PCP: Dr Janit Pagan   REFERRING PROVIDER: Dr Sinclair Ship   REFERRING DIAG: [phone] (ICD-10-CM) - Right knee pain, unspecified chronicity  THERAPY DIAG:  Chronic pain of right knee  Other abnormalities of gait and mobility  Rationale for Evaluation and Treatment Rehabilitation  ONSET DATE: long standing knee and shoulder pain   SUBJECTIVE:   SUBJECTIVE STATEMENT: Patient reports over the past week her knees been significantly painful.  It has kept her from going to the gym.  She has been trying to work out at home though.   PERTINENT HISTORY: Anxiety, Low back pain, COPD, Blood Clots, OA of the right knee; Obesity, Left leg DVT; Seizures; Stroke Tobacco abuse   PAIN:  Are you having pain? Yes: NPRS scale:8/10 current but reaches a 10/10 as the day goes on  Pain location: bilateral knee  Pain description: aching Aggravating factors: pain  gets worse as she exercises  Relieving factors: ice and rest     PRECAUTIONS: None  WEIGHT BEARING RESTRICTIONS: No  FALLS:  Has patient fallen in last 6 months? No  LIVING ENVIRONMENT: Step up into the house  OCCUPATION:  Not working   Hobbies:  Walking; reading; going to the gym; going to church, music and signing    PLOF: Independent  PATIENT GOALS:   To have less pain in her knees and shoulders    OBJECTIVE:   DIAGNOSTIC FINDINGS:  X-ray of the right knee:  IMPRESSION: Moderate to severe medial compartment and mild to moderate patellofemoral compartment osteoarthritis.   PATIENT SURVEYS:  FOTO    COGNITION: Overall cognitive status: Within functional limits for tasks assessed     SENSATION: WFL  EDEMA:    POSTURE: rounded shoulders and  forward head Right knee valgus in standing   PALPATION: Limited patella motion   LOWER EXTREMITY ROM:  Active ROM Right Re-eval 3/7 Left eval  Hip flexion    Hip extension    Hip abduction    Hip adduction    Hip internal rotation    Hip external rotation    Knee flexion Pain passed 110 degrees   Knee extension Mild extension limitation    Ankle dorsiflexion    Ankle plantarflexion    Ankle inversion    Ankle eversion     (Blank rows = not tested)  LOWER EXTREMITY MMT:  MMT Right eval Left eval Right   Left   Right  Left   Hip flexion 34.5 24.7 30.1 32.6 46.6 46.5  Hip extension        Hip abduction 45.4 35.2 47.7 42.5 62.0 58.9  Hip adduction        Hip internal rotation        Hip external rotation        Knee flexion        Knee extension 38.9 43.3 50.9 52.7 44.0 57.8  Ankle dorsiflexion        Ankle plantarflexion        Ankle inversion        Ankle eversion         (Blank rows = not tested)  UPPER EXTREMITY MMT:  MMT Right eval Left eval    Shoulder flexion 4/5 3/5 Pain  4+/5 4+/5 mild pain   Shoulder extension      Shoulder abduction      Shoulder adduction      Shoulder extension      Shoulder internal rotation 4/5 with pain  4/5 pain  5/5 5/5  Shoulder external rotation 4+/5 no pain  3+/5 pain  4+/5 4+/5   Middle trapezius      Lower trapezius      Elbow flexion      Elbow extension      Wrist flexion      Wrist extension      Wrist ulnar deviation      Wrist radial deviation      Wrist pronation      Wrist supination      Grip strength       (Blank rows = not tested)  Palpation: spasming of both upper traps  Tender to palpation in bipceps groove bilateral L> R   GAIT:  right knee valgus     TODAY'S TREATMENT  4/5 Manual: Trigger point release using IASTM and manual trigger point release to VMO and lateral quad.  Grade 2 and grade 3 PA and AP  mobilizations to reduce inflammation in the knee and improve terminal knee extension.  Quad set x 20 Straight leg raise 2 x 10 Bridge 2 x 10  Seated hip abduction green 2 x 10  Standing: Heel raise 01/28/23 Pt seen for aquatic therapy today.  Treatment took place in water 3.5-4.75 ft in depth at the [facility] pool. Temp of water was 91.  Pt entered/exited the pool via stairs with hand rail.  *walking forward, back and side stepping Yellow HB submerged at sides for core engagement forward and backwrad amb Side lunges with ue add/abd yellow HB x 4 widths. Step ups leading R/L x10-12 bottom step with ue support hand rails. gastroc, hamstring and quad stretching R/L on steps UE support wall: df; pf; marching; add/abd; hip extension; squats x12 Cycling on yellow noodle  Pt requires the buoyancy and hydrostatic pressure of water for support, and to offload joints by unweighting joint load by at least 50 % in navel deep water and by at least 75-80% in chest to neck deep water.  Viscosity of the water is needed for resistance of strengthening. Water current perturbations provides challenge to standing balance requiring increased core activation.     3/7 Reviewed tests and Mesures for the patient  Manual therapy: Trigger point release to VMO, distal IT band, lateral hamstring and gastroc area.  Reviewed self soft tissue mobilization to both areas.  Reviewed home exercises to perform over the next couple days.  Quad set 2 x 10 Straight leg raise x 10 x 8 fatigue with a second set  Seated hamstring stretch 2 x 20-second hold Seated hip abduction 2 x 20 green Reviewed how to use current HEP at home.  12/26 Performed re-assessment on the patient. Reviewed test and measures and goals including strength testing, FOTO, and ROM   Reviewed goals and POC going forward   Bilateral er 2x15 red  Bilateral horizontal abduction 2x15 red  Bilateral flexion  2x15 red   Row machine  2x15 10 lbs  Shoulder extensions 2x15 10 lbs   Lat visit  Pt seen for aquatic therapy today.  Treatment took place in water 3.25-4.5 ft in depth at the [facility] pool. Temp of water was 92.  Pt entered/exited the pool via stair with hand rail.  UE support on barbell: walking forward, back and side stepping. Gait curing for heel strike and toe off  -forward and backward slow jog -side stepping with increased speed Hip hinging for glut and LB strengthening  LE SLS with opposite oscillations in sagittal and frontal planes 2-3 x 10-12 reps -Leg press sm blue squoodle 3 x 15 s interval fast/slow R/L           Standing : df/pf unsupported ; add/abd x 10. Squats 3.6 ft 2x10 cues for firing target muscles Completed with abdominal bracing Using hand bells: core and hip strengthening working sagittal and frontal plane oscillations sets of 10-12 reps  le staggered  Pt requires the buoyancy and hydrostatic pressure of water for support, and to offload joints by unweighting joint load by at least 50 % in navel deep water and by at least 75-80% in chest to neck deep water.  Viscosity of the water is needed for resistance of strengthening. Water current perturbations provides challenge to standing balance requiring increased core activation.  11/16 Leg press 25 3x15 Hip abduction machine 3x15  Tips given for adjusting sets and reps depending on soreness levels.   1# [employer] Leg Press 15 lbs 2x15  #6 [employer] Hip Abduction 30 2x15    12# [employer] row 15 lbs 2x15  15# [employer] triceps press down 30 lbs 2x15   Cable Rows 10 lbs 2x10  Cable Extension 10 lbs  2x10     11/9 Manual: Grade 1 and II PA and PA glides for pain; STM to posterior knee and lateral knee. Reviewed self soft tissue mobilization for home  Quad set 3x15   Evaled patients shoulders;   Wand flexion in limited pain free range 3x10   Band er 2x10 red bilateral  Rows 2x10 red reviewed  technique  Shoulder extension 2x10     PATIENT EDUCATION:  Education details: EP, symptom management; patella mobility  Person educated: Patient Education method: Explanation, Demonstration, Tactile cues, Verbal cues, and Handouts Education comprehension: verbalized understanding, returned demonstration, verbal cues required, tactile cues required, and needs further education  HOME EXERCISE PROGRAM: [id number]  Aquatics added 10/22/22 FZ  01/28/23: updated aquatic HEP Aquatic home exercise program from [employer] utilizes pictures from land based exercises, but has been adapted prior to lamination and issuance.  FZ  ASSESSMENT:  CLINICAL IMPRESSION: The patient reports increased pain over the past week.  Therapy focused on manual therapy to reduce trigger points and reduce overall pain.  She reports improved pain following treatment.  We reviewed a base exercise program.  She was advised to follow this exercise program over the next week or so until her pain starts to go down.  She was also advised that she can return to the gym and work on light leg activity and NuStep machine.  She is also being seen for aquatics at this time.  She does not think she is going to be able to come into the month of April.  She plans to follow-up in May.  We will reassess her progress at this time.   The patient presents to therapy for re-eval following a long period of self guided exercise in the gym. She has had an acute flair-up of knee pain. She had several large painful trigger points in her quad, IT band and hamstring. She had limited flexion. Following manual therapy to the area she had reduced pain with flexion. We reviewed her trigger points with her for ome soft tissue work. She will continue to work with our trainers at the gym. We reviewed a base exercise program with her to begin. We will also schedule her for a few pool sessions. She was advised to modify her exercise program at home. We will submit  to continue wiorking with the patient 1-2x/W8. She tolerated her base exercises well today.  The patient has a appointment scheduled with trainer following physical therapy she was advised to perform mostly upper body exercises as tolerated.  Therapy took spoke with the trainer.  She reports the only thing that she has been doing that might cause pain would be squats but they will hold on those today. OBJECTIVE IMPAIRMENTS: Abnormal gait, decreased activity tolerance, difficulty walking, decreased ROM, decreased strength, and pain.   ACTIVITY LIMITATIONS: carrying, lifting, bending, sitting, standing, squatting, stairs, transfers, bathing, toileting, dressing, and locomotion level  PARTICIPATION LIMITATIONS: meal prep, cleaning, laundry, shopping, community activity, and yard work  PERSONAL FACTORS: 3+ comorbidities: Anxiety, Low back pain, COPD, Blood Clots, OA of the right knee;  Obesity, Left leg DVT; Seizures; Stroke Tobacco abuse  are also affecting patient's functional outcome.   REHAB POTENTIAL: Good  CLINICAL DECISION MAKING: Evolving/moderate complexity increasing pain with activity   EVALUATION COMPLEXITY: Moderate   GOALS: Goals reviewed with patient? Yes  SHORT TERM GOALS: Target date: 09/19/2022  Patient will increase left leg gross strength by 5 lbs  Baseline: See chart above  Goal status: ongoing 3/7   2.  Patient will demonstrate improved patella motion and less popping  Baseline: painfull patella motion  Goal status: ongoing 3/7  3.  Patient will be be able to modify her program when he knee OA flares up  Baseline:  Goal status: ongoing 4. Patient will demonstrate full right knee flexion without pain  Goal status: 3/7    LONG TERM GOALS: Target date: 10/24/2022   Patient will return to a full gym program without significant pain  Baseline:  Goal status: continuing to progress towards full program 12/26  2.  Patient will have a full pool program  Baseline:   Goal status: should finalize in the next few visits 12/26  3.  Patient will demonstrate equa strength left and right in order to walk in the community without increased stresss on the right leg.  Baseline: achieved. Significant improvements 12/26    PLAN:  PT FREQUENCY: 1-2x/week  PT DURATION: 8 weeks  PLANNED INTERVENTIONS: Therapeutic exercises, Therapeutic activity, Neuromuscular re-education, Balance training, Gait training, Patient/Family education, Self Care, Joint mobilization, Stair training, Aquatic Therapy, Dry Needling, Cryotherapy, Moist heat, Taping, Ultrasound, and Manual therapy  PLAN FOR NEXT SESSION: Work on decreasing trigger points to quad and IT band and hamstring.  Consider trigger point dry needling if indicated.  Reviewed current home exercise program.  Progress back to gym program as soon as possible.  Corrie DandyMary Tomma Lightning(Frankie) Ziemba MPT 01/28/23 1043  Check all possible CPT codes: [id number] - PT Re-evaluation, 97112- Neuro Re-education, [phone] - Gait Training, [phone] - Manual Therapy, 97530 - Therapeutic Activities, 97014 - Electrical stimulation (unattended), [id number] - Iontophoresis, [id number] - Ultrasound, and [id number] - Aquatic therapy    Check all conditions that are expected to impact treatment: Musculoskeletal disorders   If treatment provided at initial evaluation, no treatment charged due to lack of authorization.

## 2023-02-12 ENCOUNTER — Encounter (INDEPENDENT_AMBULATORY_CARE_PROVIDER_SITE_OTHER): Payer: Self-pay | Admitting: Family Medicine

## 2023-02-13 NOTE — Progress Notes (Addendum)
TeleHealth Visit:  This visit was completed with telemedicine (audio/video) technology. Carla Hanson has verbally consented to this TeleHealth visit. The patient is located at home, the provider is located at home. The participants in this visit include the listed provider and patient. The visit was conducted today via MyChart video.  OBESITY Carla Hanson is here to discuss her progress with her obesity treatment plan along with follow-up of her obesity related diagnoses.   Today's visit was # 40 Starting weight: 324 lbs Starting date: 01/03/2021 Weight at last in office visit: 290 lbs on 01/15/23 Total weight loss: 34 lbs at last in office visit on 01/15/23. Today's reported weight (02/17/23): none reported   Nutrition Plan: low carbohydrate plan    Current exercise:  lifting weights and doing cardio for 30-60 minutes   Interim History:  She is tracking and getting 82-108 gms pf protein per day.  She is not following any plan completely but journaling some of the time and following low carb some of the time. Hunger and cravings are better controlled. She is seeing Dr. Ulice Bold in June to discuss possible abdominal panniculectomy and wants to lose as much weight as possible by then.  Eating all of the prescribed protein: yes  Skipping meals: No Drinking adequate water: Yes Drinking sugar sweetened beverages: Yes occasionally Hunger controlled: well controlled. Cravings controlled:  well controlled. Assessment/Plan:  1. Prediabetes Last A1c was 6.1  Medication(s): Metformin 500 mg 3 times per day with meals. Denies polyphagia.  Denies side effects from metformin. Lab Results  Component Value Date   HGBA1C 6.1 (H) 12/18/2022   HGBA1C 6.3 (H) 07/22/2022   HGBA1C 6.1 (H) 03/04/2022   HGBA1C 6.2 (H) 11/20/2021   HGBA1C 6.3 (H) 05/21/2021   Lab Results  Component Value Date   INSULIN 48.0 (H) 12/18/2022   INSULIN 62.0 (H) 07/22/2022   INSULIN 81.0 (H) 03/04/2022   INSULIN 60.8  (H) 11/20/2021   INSULIN 64.3 (H) 05/21/2021    Plan: Continue and refill metformin 500 mg 3 times daily with meals.   2.  Symptomatic abdominal panniculus She has intermittent irritation and rashes under panniculus. Has needed prescription ointment in the past.  She is trying to keep the area clean and dry.  Plan: Work on sticking to plan very closely to maximize weight loss. She will see Dr. Ulice Bold June 25.  3. Morbid Obesity: Current BMI 80 Carla Hanson is currently in the action stage of change. As such, her goal is to continue with weight loss efforts.  She has agreed to the Category 2 plan.  Category 2 plan sent via MyChart.  Exercise goals: As is  Behavioral modification strategies: increasing lean protein intake, decreasing simple carbohydrates , and planning for success.  Carla Hanson has agreed to follow-up with our clinic in 3 weeks.   No orders of the defined types were placed in this encounter.   Medications Discontinued During This Encounter  Medication Reason   metFORMIN (GLUCOPHAGE) 500 MG tablet Reorder     Meds ordered this encounter  Medications   metFORMIN (GLUCOPHAGE) 500 MG tablet    Sig: TAKE 1 TABLET(500 MG) BY MOUTH THREE TIMES DAILY WITH MEALS    Dispense:  90 tablet    Refill:  0    Order Specific Question:   Supervising Provider    Answer:   Glennis Brink [[id number]]      Objective:   VITALS: Per patient if applicable, see vitals. GENERAL: Alert and in no acute distress. CARDIOPULMONARY: No increased  WOB. Speaking in clear sentences.  PSYCH: Pleasant and cooperative. Speech normal rate and rhythm. Affect is appropriate. Insight and judgement are appropriate. Attention is focused, linear, and appropriate.  NEURO: Oriented as arrived to appointment on time with no prompting.   Attestation Statements:   Reviewed by clinician on day of visit: allergies, medications, problem list, medical history, surgical history, family history, social history, and  previous encounter notes.  This was prepared with the assistance of Engineer, civil (consulting).  Occasional wrong-word or sound-a-like substitutions may have occurred due to the inherent limitations of voice recognition software.

## 2023-02-17 ENCOUNTER — Encounter (INDEPENDENT_AMBULATORY_CARE_PROVIDER_SITE_OTHER): Payer: Self-pay | Admitting: Family Medicine

## 2023-02-17 ENCOUNTER — Telehealth (INDEPENDENT_AMBULATORY_CARE_PROVIDER_SITE_OTHER): Payer: Medicaid Other | Admitting: Family Medicine

## 2023-02-17 ENCOUNTER — Ambulatory Visit (INDEPENDENT_AMBULATORY_CARE_PROVIDER_SITE_OTHER): Payer: Medicaid Other | Admitting: Family Medicine

## 2023-02-17 DIAGNOSIS — E65 Localized adiposity: Secondary | ICD-10-CM | POA: Diagnosis not present

## 2023-02-17 DIAGNOSIS — R7303 Prediabetes: Secondary | ICD-10-CM | POA: Diagnosis not present

## 2023-02-17 DIAGNOSIS — Z6841 Body Mass Index (BMI) 40.0 and over, adult: Secondary | ICD-10-CM | POA: Diagnosis not present

## 2023-02-17 MED ORDER — METFORMIN HCL 500 MG PO TABS: ORAL_TABLET | ORAL | 0 refills | Status: DC

## 2023-02-21 ENCOUNTER — Other Ambulatory Visit (INDEPENDENT_AMBULATORY_CARE_PROVIDER_SITE_OTHER): Payer: Self-pay | Admitting: Family Medicine

## 2023-02-21 DIAGNOSIS — I10 Essential (primary) hypertension: Secondary | ICD-10-CM

## 2023-03-03 NOTE — Progress Notes (Signed)
  TeleHealth Visit:  This visit was completed with telemedicine (audio/video) technology. Carla Hanson has verbally consented to this TeleHealth visit. The patient is located at home, the provider is located at home. The participants in this visit include the listed provider and patient. The visit was conducted today via MyChart video.  OBESITY Carla Hanson is here to discuss her progress with her obesity treatment plan along with follow-up of her obesity related diagnoses.   Today's visit was # 41 Starting weight: 324 lbs Starting date: 01/03/2021 Weight at last in office visit: 290 lbs on 01/15/23 Total weight loss: 34 lbs at last in office visit on 01/15/23. Today's reported weight (03/04/23): none reported  Nutrition Plan: the Category 2 plan - 90% adherence.  Current exercise:  exercising at home (walking, yoga) due to wrist injury  Interim History:  She has injured her right wrist but it is slowly improving. She has started the cat 2 plan. She is not eating the bread on the plan. Having omelets for breakfast or protein cereal.  Lunch is a salad with 6 oz of meat.  Uses dressing that is 80 cal per serving. Dinner is 6 oz chicken and vegetables.  Water intake is great. Hunger is satisfied.   Assessment/Plan:  1. Other depression/emotional eating Carla Hanson has had issues with stress/emotional eating. Currently this is well controlled. Overall mood is stable. Medication(s):  none  Plan: Continue to work on eating adequate protein and limiting simple carbohydrates.  2. Morbid Obesity: Current BMI 53 Carla Hanson is currently in the action stage of change. As such, her goal is to continue with weight loss efforts.  She has agreed to the Category 2 plan.  1. Breakfast and lunch options sent via email. 2.  Substitute 80-calorie dressing for the crackers when she has a salad at lunch.  Exercise goals: Resume normal exercise regimen as wrist improves.  Behavioral modification strategies:  increasing lean protein intake, decreasing simple carbohydrates , meal planning , and planning for success.  Carla Hanson has agreed to follow-up with our clinic in 2 weeks.   No orders of the defined types were placed in this encounter.   There are no discontinued medications.   No orders of the defined types were placed in this encounter.     Objective:   VITALS: Per patient if applicable, see vitals. GENERAL: Alert and in no acute distress. CARDIOPULMONARY: No increased WOB. Speaking in clear sentences.  PSYCH: Pleasant and cooperative. Speech normal rate and rhythm. Affect is appropriate. Insight and judgement are appropriate. Attention is focused, linear, and appropriate.  NEURO: Oriented as arrived to appointment on time with no prompting.   Attestation Statements:   Reviewed by clinician on day of visit: allergies, medications, problem list, medical history, surgical history, family history, social history, and previous encounter notes.  This was prepared with the assistance of Engineer, civil (consulting).  Occasional wrong-word or sound-a-like substitutions may have occurred due to the inherent limitations of voice recognition software.

## 2023-03-04 ENCOUNTER — Telehealth (INDEPENDENT_AMBULATORY_CARE_PROVIDER_SITE_OTHER): Payer: Medicaid Other | Admitting: Family Medicine

## 2023-03-04 ENCOUNTER — Encounter (INDEPENDENT_AMBULATORY_CARE_PROVIDER_SITE_OTHER): Payer: Self-pay | Admitting: Family Medicine

## 2023-03-04 DIAGNOSIS — Z6841 Body Mass Index (BMI) 40.0 and over, adult: Secondary | ICD-10-CM

## 2023-03-04 DIAGNOSIS — F3289 Other specified depressive episodes: Secondary | ICD-10-CM

## 2023-03-11 ENCOUNTER — Encounter (HOSPITAL_BASED_OUTPATIENT_CLINIC_OR_DEPARTMENT_OTHER): Payer: Self-pay

## 2023-03-11 ENCOUNTER — Ambulatory Visit (HOSPITAL_BASED_OUTPATIENT_CLINIC_OR_DEPARTMENT_OTHER): Payer: Medicaid Other | Admitting: Physical Therapy

## 2023-03-11 ENCOUNTER — Ambulatory Visit (INDEPENDENT_AMBULATORY_CARE_PROVIDER_SITE_OTHER): Payer: Medicaid Other | Admitting: Family Medicine

## 2023-03-13 ENCOUNTER — Encounter (HOSPITAL_BASED_OUTPATIENT_CLINIC_OR_DEPARTMENT_OTHER): Payer: Self-pay

## 2023-03-13 ENCOUNTER — Ambulatory Visit (HOSPITAL_BASED_OUTPATIENT_CLINIC_OR_DEPARTMENT_OTHER): Payer: Medicaid Other | Admitting: Physical Therapy

## 2023-03-17 ENCOUNTER — Other Ambulatory Visit (INDEPENDENT_AMBULATORY_CARE_PROVIDER_SITE_OTHER): Payer: Self-pay | Admitting: Family Medicine

## 2023-03-17 DIAGNOSIS — R7303 Prediabetes: Secondary | ICD-10-CM

## 2023-03-19 ENCOUNTER — Encounter: Payer: Self-pay | Admitting: Family Medicine

## 2023-03-20 ENCOUNTER — Telehealth (INDEPENDENT_AMBULATORY_CARE_PROVIDER_SITE_OTHER): Payer: Medicaid Other | Admitting: Student

## 2023-03-20 DIAGNOSIS — M62838 Other muscle spasm: Secondary | ICD-10-CM | POA: Diagnosis not present

## 2023-03-20 MED ORDER — TIZANIDINE HCL 4 MG PO TABS: 4.0000 mg | ORAL_TABLET | Freq: Every day | ORAL | 0 refills | Status: DC

## 2023-03-20 NOTE — Assessment & Plan Note (Signed)
Patient notes tightness in the leg for past week with activity.  Patient believes may have come from her gym routine as she exercises regularly.  Patient notes she has tried stretching to relieve the tightness and leg with little relief although it does feel better when she stretches it.  She is also noting that the tightness/pain is affecting her ability to walk and balance.  Given symptoms and history most concerning for muscle spasm, will treat with muscle relaxer for short course and encourage patient to stretch.  Patient told to take muscle relaxer in the evenings and avoid driving /activity as this medication is sedating. - Tizanidine 4 mg x 4 nightly - Stretching exercises for hamstring - Follow-up 1 week if not improved, will consider physical therapy

## 2023-03-20 NOTE — Progress Notes (Signed)
[facility] Telemedicine Visit  Patient consented to have virtual visit and was identified by name and date of birth. Method of visit: Video  Encounter participants: Patient: Carla Hanson - located at home Provider: Bess Kinds - located at clinic  Chief Complaint: muscle spasm  HPI: Has had a spasm in right leg for about a week. Has tried to walk/stretch it out, but no relief. Has had to use a cane since last Saturday. Sometimes feeling like she might fall because leg locks up. Spasm located anteriorly near groin. Denies feeling any knots in leg. Notes she's been using gym machines and wonder if coming from there.    ROS: per HPI  Pertinent PMHx: Right knee pain/OA  Exam:  There were no vitals taken for this visit.  General: Well appearing, NAD  Assessment/Plan: Muscle Spasm  Muscle spasm Patient notes tightness in the leg for past week with activity.  Patient believes may have come from her gym routine as she exercises regularly.  Patient notes she has tried stretching to relieve the tightness and leg with little relief although it does feel better when she stretches it.  She is also noting that the tightness/pain is affecting her ability to walk and balance.  Given symptoms and history most concerning for muscle spasm, will treat with muscle relaxer for short course and encourage patient to stretch.  Patient told to take muscle relaxer in the evenings and avoid driving /activity as this medication is sedating. - Tizanidine 4 mg x 4 nightly - Stretching exercises for hamstring - Follow-up 1 week if not improved, will consider physical therapy    Time spent during visit with patient: 15 minutes

## 2023-03-21 ENCOUNTER — Other Ambulatory Visit: Payer: Self-pay | Admitting: Student

## 2023-03-25 ENCOUNTER — Ambulatory Visit (INDEPENDENT_AMBULATORY_CARE_PROVIDER_SITE_OTHER): Payer: Medicaid Other | Admitting: Family Medicine

## 2023-03-25 ENCOUNTER — Telehealth (INDEPENDENT_AMBULATORY_CARE_PROVIDER_SITE_OTHER): Payer: Medicaid Other | Admitting: Family Medicine

## 2023-03-25 ENCOUNTER — Encounter (INDEPENDENT_AMBULATORY_CARE_PROVIDER_SITE_OTHER): Payer: Self-pay | Admitting: Family Medicine

## 2023-03-25 VITALS — BP 128/75 | HR 68 | Temp 98.7°F | Ht 62.0 in | Wt 290.0 lb

## 2023-03-25 DIAGNOSIS — E782 Mixed hyperlipidemia: Secondary | ICD-10-CM

## 2023-03-25 DIAGNOSIS — Z6841 Body Mass Index (BMI) 40.0 and over, adult: Secondary | ICD-10-CM

## 2023-03-25 DIAGNOSIS — E669 Obesity, unspecified: Secondary | ICD-10-CM

## 2023-03-25 DIAGNOSIS — E559 Vitamin D deficiency, unspecified: Secondary | ICD-10-CM | POA: Diagnosis not present

## 2023-03-25 DIAGNOSIS — I10 Essential (primary) hypertension: Secondary | ICD-10-CM | POA: Diagnosis not present

## 2023-03-25 DIAGNOSIS — R7303 Prediabetes: Secondary | ICD-10-CM | POA: Diagnosis not present

## 2023-03-25 MED ORDER — METFORMIN HCL 500 MG PO TABS: ORAL_TABLET | ORAL | 0 refills | Status: DC

## 2023-03-25 NOTE — Progress Notes (Signed)
Chief Complaint:   OBESITY Carla Hanson is here to discuss her progress with her obesity treatment plan along with follow-up of her obesity related diagnoses. Carla Hanson is on the Category 2 Plan and states she is following her eating plan approximately 90% of the time. Carla Hanson states she is doing cardio and weights for 60+ minutes 4 times per week.  Today's visit was #: 42 Starting weight: 324 lbs Starting date: 01/03/2021 Today's weight: 290 lbs Today's date: 03/25/2023 Total lbs lost to date: 34 Total lbs lost since last in-office visit: 0  Interim History: Faduma continues to work on her diet.  She was changed to category 2 plan and she likes having fruit.  She is working on eating all of her food especially her protein.  She is exercising regularly, and she finds her pannus is limiting her movement and causing some rashes.  She was referred to Dr. Ulice Bold by her PCP, Dr. Lum Babe.  Subjective:   1. Mixed hyperlipidemia Carla Hanson is on Crestor, and she is working on her diet and weight loss.  She is due for labs.  2. Pre-diabetes Carla Hanson is on metformin, and she is working on her diet.  She is due for labs, and she requests a refill today.  3. Vitamin D deficiency Carla Hanson is on a OTC vitamin D 5000 IU daily, and she is due for labs.  4. Essential hypertension Carla Hanson's blood pressure is at goal this morning.  She has no side effects with her medications.  She is due for labs.  She denies chest pain, headache, or dizziness.  Assessment/Plan:   1. Mixed hyperlipidemia We will check labs today.  Carla Hanson will continue Crestor at 20 mg and will continue with her diet.  - Lipid Panel With LDL/HDL Ratio  2. Pre-diabetes We will check labs today.  Carla Hanson will continue metformin 500 mg 3 times daily, and we will refill for 90 days.  - metFORMIN (GLUCOPHAGE) 500 MG tablet; TAKE 1 TABLET(500 MG) BY MOUTH THREE TIMES DAILY WITH MEALS  Dispense: 270 tablet; Refill: 0 - Vitamin B12 - Hemoglobin  A1c - Insulin, random  3. Vitamin D deficiency We will check labs today, and we will follow-up at Carla Hanson's next visit.  - VITAMIN D 25 Hydroxy (Vit-D Deficiency, Fractures)  4. Essential hypertension We will check labs today.  Carla Hanson will continue with her diet, and we will continue hydrochlorothiazide and losartan.  - CMP14+EGFR  5. BMI 50.0-59.9, adult (HCC)  6. Obesity, Beginning BMI 57.4 Carla Hanson is currently in the action stage of change. As such, her goal is to continue with weight loss efforts. She has agreed to the Category 2 Plan or keeping a food journal and adhering to recommended goals of 1200-1500 calories and 85 grams of protein daily.   Carla Hanson was advised that her BMI will likely need to be below 40. We will continue to work with her to meet her goals.  Exercise goals: As is.   Behavioral modification strategies: increasing lean protein intake and no skipping meals.  Carla Hanson has agreed to follow-up with our clinic in 3 to 4 weeks. She was informed of the importance of frequent follow-up visits to maximize her success with intensive lifestyle modifications for her multiple health conditions.   Carla Hanson was informed we would discuss her lab results at her next visit unless there is a critical issue that needs to be addressed sooner. Carla Hanson agreed to keep her next visit at the agreed upon time to discuss these results.  Objective:   Blood pressure 128/75, pulse 68, temperature 98.7 F (37.1 C), height 5\' 2"  (1.575 m), weight 290 lb (131.5 kg), SpO2 100 %. Body mass index is 53.04 kg/m.  Lab Results  Component Value Date   CREATININE 0.53 (L) 12/18/2022   BUN 13 12/18/2022   NA 142 12/18/2022   K 4.1 12/18/2022   CL 103 12/18/2022   CO2 25 12/18/2022   Lab Results  Component Value Date   ALT 14 12/18/2022   AST 14 12/18/2022   ALKPHOS 48 12/18/2022   BILITOT 0.3 12/18/2022   Lab Results  Component Value Date   HGBA1C 6.1 (H) 12/18/2022   HGBA1C 6.3 (H)  07/22/2022   HGBA1C 6.1 (H) 03/04/2022   HGBA1C 6.2 (H) 11/20/2021   HGBA1C 6.3 (H) 05/21/2021   Lab Results  Component Value Date   INSULIN 48.0 (H) 12/18/2022   INSULIN 62.0 (H) 07/22/2022   INSULIN 81.0 (H) 03/04/2022   INSULIN 60.8 (H) 11/20/2021   INSULIN 64.3 (H) 05/21/2021   Lab Results  Component Value Date   TSH 1.630 07/22/2022   Lab Results  Component Value Date   CHOL 145 12/18/2022   HDL 50 12/18/2022   LDLCALC 79 12/18/2022   TRIG 83 12/18/2022   CHOLHDL 5.7 (H) 05/15/2021   Lab Results  Component Value Date   VD25OH 56.8 12/18/2022   VD25OH 44.6 07/22/2022   VD25OH 57.1 03/04/2022   Lab Results  Component Value Date   WBC 7.1 07/22/2022   HGB 12.7 07/22/2022   HCT 39.9 07/22/2022   MCV 81 07/22/2022   PLT 314 07/22/2022   Lab Results  Component Value Date   IRON 32 12/03/2016   TIBC 363 12/03/2016   FERRITIN 13 (L) 02/09/2018   Attestation Statements:   Reviewed by clinician on day of visit: allergies, medications, problem list, medical history, surgical history, family history, social history, and previous encounter notes.   I, Burt Knack, am acting as transcriptionist for Quillian Quince, MD.  I have reviewed the above documentation for accuracy and completeness, and I agree with the above. -  Quillian Quince, MD

## 2023-03-26 LAB — CMP14+EGFR
ALT: 14 IU/L (ref 0–32)
AST: 14 IU/L (ref 0–40)
Albumin/Globulin Ratio: 1.3 (ref 1.2–2.2)
Albumin: 4.1 g/dL (ref 3.8–4.9)
Alkaline Phosphatase: 50 IU/L (ref 44–121)
BUN/Creatinine Ratio: 25 — ABNORMAL HIGH (ref 9–23)
BUN: 14 mg/dL (ref 6–24)
Bilirubin Total: 0.3 mg/dL (ref 0.0–1.2)
CO2: 27 mmol/L (ref 20–29)
Calcium: 9.6 mg/dL (ref 8.7–10.2)
Chloride: 102 mmol/L (ref 96–106)
Creatinine, Ser: 0.56 mg/dL — ABNORMAL LOW (ref 0.57–1.00)
Globulin, Total: 3.1 g/dL (ref 1.5–4.5)
Glucose: 92 mg/dL (ref 70–99)
Potassium: 4.2 mmol/L (ref 3.5–5.2)
Sodium: 140 mmol/L (ref 134–144)
Total Protein: 7.2 g/dL (ref 6.0–8.5)
eGFR: 107 mL/min/{1.73_m2} (ref >59)

## 2023-03-26 LAB — LIPID PANEL WITH LDL/HDL RATIO
Cholesterol, Total: 138 mg/dL (ref 100–199)
HDL: 44 mg/dL (ref >39)
LDL Chol Calc (NIH): 72 mg/dL (ref 0–99)
LDL/HDL Ratio: 1.6 ratio (ref 0.0–3.2)
Triglycerides: 121 mg/dL (ref 0–149)
VLDL Cholesterol Cal: 22 mg/dL (ref 5–40)

## 2023-03-26 LAB — VITAMIN B12: Vitamin B-12: 391 pg/mL (ref 232–1245)

## 2023-03-26 LAB — INSULIN, RANDOM: INSULIN: 46.3 u[IU]/mL — ABNORMAL HIGH (ref 2.6–24.9)

## 2023-03-26 LAB — HEMOGLOBIN A1C
Est. average glucose Bld gHb Est-mCnc: 134 mg/dL
Hgb A1c MFr Bld: 6.3 % — ABNORMAL HIGH (ref 4.8–5.6)

## 2023-03-26 LAB — VITAMIN D 25 HYDROXY (VIT D DEFICIENCY, FRACTURES): Vit D, 25-Hydroxy: 63.8 ng/mL (ref 30.0–100.0)

## 2023-04-08 ENCOUNTER — Encounter (INDEPENDENT_AMBULATORY_CARE_PROVIDER_SITE_OTHER): Payer: Self-pay | Admitting: Family Medicine

## 2023-04-08 ENCOUNTER — Telehealth (INDEPENDENT_AMBULATORY_CARE_PROVIDER_SITE_OTHER): Payer: Medicaid Other | Admitting: Family Medicine

## 2023-04-08 DIAGNOSIS — Z6841 Body Mass Index (BMI) 40.0 and over, adult: Secondary | ICD-10-CM | POA: Diagnosis not present

## 2023-04-08 DIAGNOSIS — I10 Essential (primary) hypertension: Secondary | ICD-10-CM | POA: Diagnosis not present

## 2023-04-08 DIAGNOSIS — E65 Localized adiposity: Secondary | ICD-10-CM

## 2023-04-08 MED ORDER — HYDROCHLOROTHIAZIDE 25 MG PO TABS
25.0000 mg | ORAL_TABLET | Freq: Every day | ORAL | 1 refills | Status: DC
Start: 2023-04-08 — End: 2023-06-24

## 2023-04-08 NOTE — Progress Notes (Signed)
TeleHealth Visit:  This visit was completed with telemedicine (audio/video) technology. Carla Hanson has verbally consented to this TeleHealth visit. The patient is located at home, the provider is located at home. The participants in this visit include the listed provider and patient. The visit was conducted today via MyChart video.  OBESITY Carla Hanson is here to discuss her progress with her obesity treatment plan along with follow-up of her obesity related diagnoses.   Today's visit was # 43 Starting weight: 324 lbs Starting date: 01/03/2021 Weight at last in office visit: 290 lbs on 03/25/23 Total weight loss: 34 lbs at last in office visit on 03/25/23. Today's reported weight (04/08/23): none reported  Nutrition Plan: the Category 2 plan and keeping a food journal with goal of 1200-1500 calories and 85 grams of protein daily  Current exercise:  cardio and weights for 60+ minutes 4 times per week.  Interim History:  She is following cat 2 and staying between 1200-1450 cal per day. Protein intake is 85-138 gms day. Appetite controlled. She is feeling bloated from bread and using Zero carbs wraps.  She is always consistent with exercise.  She has an upcoming appt with Dr. Ulice Bold for consult for panniculectomy June 25. She wants to maximize her weight loss before the consultation.  Assessment/Plan:  1. Symptomatic abdominal panniculus She notes she has stinging, burning itching under panniculus. She also reports difficulty keeping the area clean due to sweat-especially after exercise.  Plan: She will concentrate on following the low-carb plan as exactly as possible over the next 3 to 4 weeks. Attend consultation on June 25. Keep area under panniculus as dry as possible.   2. White coat syndrome with diagnosis of hypertension. Hypertension well controlled. Improved over last few months. Medication(s): HCTZ 25 mg daily, losartan 50 mg daily.  BP Readings from Last 3  Encounters:  03/25/23 128/75  01/17/23 133/74  01/15/23 134/86   Lab Results  Component Value Date   CREATININE 0.56 (L) 03/25/2023   CREATININE 0.53 (L) 12/18/2022   CREATININE 0.55 (L) 07/22/2022   No results found for: "GFR"  Plan: Continue all antihypertensives at current dosages. Refill HCTZ 25 mg daily.  3. Morbid Obesity: Current BMI 53  Carla Hanson is currently in the action stage of change. As such, her goal is to continue with weight loss efforts.  She has agreed to keeping a food journal with goal of 1200-1500 calories and 85 grams of protein daily and low carbohydrate plan.  1.  Low-carb plan sent via email to patient. 2.  She will journal and follow the low-carb plan and follow these extremely closely over the next 3 to 4 weeks to maximize weight loss before her visit with Dr. Ulice Bold.  Exercise goals: as is  Behavioral modification strategies: increasing lean protein intake, decreasing simple carbohydrates , and keep a strict food journal.  Carla Hanson has agreed to follow-up with our clinic in 2 weeks.  No orders of the defined types were placed in this encounter.   Medications Discontinued During This Encounter  Medication Reason   hydrochlorothiazide (HYDRODIURIL) 25 MG tablet Reorder     Meds ordered this encounter  Medications   hydrochlorothiazide (HYDRODIURIL) 25 MG tablet    Sig: Take 1 tablet (25 mg total) by mouth daily.    Dispense:  90 tablet    Refill:  1    Order Specific Question:   Supervising Provider    Answer:   Glennis Brink [[id number]]      Objective:  VITALS: Per patient if applicable, see vitals. GENERAL: Alert and in no acute distress. CARDIOPULMONARY: No increased WOB. Speaking in clear sentences.  PSYCH: Pleasant and cooperative. Speech normal rate and rhythm. Affect is appropriate. Insight and judgement are appropriate. Attention is focused, linear, and appropriate.  NEURO: Oriented as arrived to appointment on time with no  prompting.   Attestation Statements:   Reviewed by clinician on day of visit: allergies, medications, problem list, medical history, surgical history, family history, social history, and previous encounter notes.   This was prepared with the assistance of Engineer, civil (consulting).  Occasional wrong-word or sound-a-like substitutions may have occurred due to the inherent limitations of voice recognition software.

## 2023-04-10 ENCOUNTER — Encounter (INDEPENDENT_AMBULATORY_CARE_PROVIDER_SITE_OTHER): Payer: Self-pay | Admitting: Family Medicine

## 2023-04-16 ENCOUNTER — Encounter (INDEPENDENT_AMBULATORY_CARE_PROVIDER_SITE_OTHER): Payer: Self-pay | Admitting: Family Medicine

## 2023-04-18 ENCOUNTER — Encounter (INDEPENDENT_AMBULATORY_CARE_PROVIDER_SITE_OTHER): Payer: Self-pay | Admitting: Family Medicine

## 2023-04-22 ENCOUNTER — Ambulatory Visit (INDEPENDENT_AMBULATORY_CARE_PROVIDER_SITE_OTHER): Payer: Medicaid Other | Admitting: Family Medicine

## 2023-04-22 ENCOUNTER — Encounter (INDEPENDENT_AMBULATORY_CARE_PROVIDER_SITE_OTHER): Payer: Self-pay | Admitting: Family Medicine

## 2023-04-22 VITALS — BP 113/78 | HR 75 | Temp 98.2°F | Ht 62.0 in | Wt 285.0 lb

## 2023-04-22 DIAGNOSIS — E782 Mixed hyperlipidemia: Secondary | ICD-10-CM

## 2023-04-22 DIAGNOSIS — E669 Obesity, unspecified: Secondary | ICD-10-CM | POA: Diagnosis not present

## 2023-04-22 DIAGNOSIS — R7303 Prediabetes: Secondary | ICD-10-CM

## 2023-04-22 DIAGNOSIS — E559 Vitamin D deficiency, unspecified: Secondary | ICD-10-CM

## 2023-04-22 DIAGNOSIS — I158 Other secondary hypertension: Secondary | ICD-10-CM | POA: Diagnosis not present

## 2023-04-22 DIAGNOSIS — Z6841 Body Mass Index (BMI) 40.0 and over, adult: Secondary | ICD-10-CM

## 2023-04-22 MED ORDER — LOSARTAN POTASSIUM 50 MG PO TABS
ORAL_TABLET | ORAL | 0 refills | Status: DC
Start: 2023-04-22 — End: 2023-05-06

## 2023-04-22 NOTE — Progress Notes (Unsigned)
Chief Complaint:   OBESITY Carla Hanson is here to discuss her progress with her obesity treatment plan along with follow-up of her obesity related diagnoses. Carla Hanson is on keeping a food journal and adhering to recommended goals of 1200-1500 calories and 85 grams of protein and following a lower carbohydrate, vegetable and lean protein rich diet plan and states she is following her eating plan approximately 90% of the time. Carla Hanson states she is doing cardio and weights for 30-60 minutes 3-4 times per week.  Today's visit was #: 44 Starting weight: 324 lbs Starting date: 01/03/2021 Today's weight: 285 lbs Today's date: 04/22/2023 Total lbs lost to date: 39 Total lbs lost since last in-office visit: 5  Interim History: Patient has been doing well with her weight loss.  She feels she is in a better mindset to do the low carbohydrate plan and she is working on meal planning.  She has increased her exercise as well.  Subjective:   1. Other secondary hypertension Patient's blood pressure is well-controlled on Cozaar and hydrochlorothiazide.  No chest pain or headache was mentioned.  She is doing well with her diet and exercise.  2. Pre-diabetes Patient's A1c is worsening and has increased to 6.3 but it is still below the diabetic range.  She denies hypoglycemia.  I discussed labs with the patient today.  3. Mixed hyperlipidemia Patient is on Crestor, and her LDL has improved.  Her HDL has decreased however but she has already increased her exercise.  I discussed labs with the patient today.  4. Vitamin D deficiency Patient is on vitamin D, and her level is at goal.  She has no signs of over replacement.  I discussed labs with the patient today.  Assessment/Plan:   1. Other secondary hypertension Patient will continue with her diet and medications, and we will refill Cozaar 50 mg for 90 days.  - losartan (COZAAR) 50 MG tablet; TAKE 1 TABLET(50 MG) BY MOUTH DAILY  Dispense: 90 tablet;  Refill: 0  2. Pre-diabetes Patient will continue metformin and her low carbohydrate diet.  We will continue to monitor closely.  3. Mixed hyperlipidemia Patient will continue Crestor, diet, and exercise.  4. Vitamin D deficiency Patient will continue vitamin D prescription, and we will continue to monitor.  5. BMI 50.0-59.9, adult (HCC)  6. Obesity, Beginning BMI 57.4 Carla Hanson is currently in the action stage of change. As such, her goal is to continue with weight loss efforts. She has agreed to following a lower carbohydrate, vegetable and lean protein rich diet plan.   Exercise goals: As is.   Behavioral modification strategies: increasing lean protein intake.  Carla Hanson has agreed to follow-up with our clinic in 3 to 4 weeks. She was informed of the importance of frequent follow-up visits to maximize her success with intensive lifestyle modifications for her multiple health conditions.   Objective:   Blood pressure 113/78, pulse 75, temperature 98.2 F (36.8 C), height 5\' 2"  (1.575 m), weight 285 lb (129.3 kg), SpO2 98 %. Body mass index is 52.13 kg/m.  Lab Results  Component Value Date   CREATININE 0.56 (L) 03/25/2023   BUN 14 03/25/2023   NA 140 03/25/2023   K 4.2 03/25/2023   CL 102 03/25/2023   CO2 27 03/25/2023   Lab Results  Component Value Date   ALT 14 03/25/2023   AST 14 03/25/2023   ALKPHOS 50 03/25/2023   BILITOT 0.3 03/25/2023   Lab Results  Component Value Date  HGBA1C 6.3 (H) 03/25/2023   HGBA1C 6.1 (H) 12/18/2022   HGBA1C 6.3 (H) 07/22/2022   HGBA1C 6.1 (H) 03/04/2022   HGBA1C 6.2 (H) 11/20/2021   Lab Results  Component Value Date   INSULIN 46.3 (H) 03/25/2023   INSULIN 48.0 (H) 12/18/2022   INSULIN 62.0 (H) 07/22/2022   INSULIN 81.0 (H) 03/04/2022   INSULIN 60.8 (H) 11/20/2021   Lab Results  Component Value Date   TSH 1.630 07/22/2022   Lab Results  Component Value Date   CHOL 138 03/25/2023   HDL 44 03/25/2023   LDLCALC 72  03/25/2023   TRIG 121 03/25/2023   CHOLHDL 5.7 (H) 05/15/2021   Lab Results  Component Value Date   VD25OH 63.8 03/25/2023   VD25OH 56.8 12/18/2022   VD25OH 44.6 07/22/2022   Lab Results  Component Value Date   WBC 7.1 07/22/2022   HGB 12.7 07/22/2022   HCT 39.9 07/22/2022   MCV 81 07/22/2022   PLT 314 07/22/2022   Lab Results  Component Value Date   IRON 32 12/03/2016   TIBC 363 12/03/2016   FERRITIN 13 (L) 02/09/2018   Attestation Statements:   Reviewed by clinician on day of visit: allergies, medications, problem list, medical history, surgical history, family history, social history, and previous encounter notes.   I, Burt Knack, am acting as transcriptionist for Quillian Quince, MD.  I have reviewed the above documentation for accuracy and completeness, and I agree with the above. -  Quillian Quince, MD

## 2023-04-24 ENCOUNTER — Other Ambulatory Visit: Payer: Self-pay | Admitting: Family Medicine

## 2023-04-24 DIAGNOSIS — J3089 Other allergic rhinitis: Secondary | ICD-10-CM

## 2023-04-29 ENCOUNTER — Ambulatory Visit (INDEPENDENT_AMBULATORY_CARE_PROVIDER_SITE_OTHER): Payer: Medicaid Other | Admitting: Plastic Surgery

## 2023-04-29 ENCOUNTER — Encounter: Payer: Self-pay | Admitting: Plastic Surgery

## 2023-04-29 VITALS — BP 165/62 | HR 79 | Ht 62.0 in | Wt 286.2 lb

## 2023-04-29 DIAGNOSIS — Z6841 Body Mass Index (BMI) 40.0 and over, adult: Secondary | ICD-10-CM | POA: Diagnosis not present

## 2023-04-29 DIAGNOSIS — M793 Panniculitis, unspecified: Secondary | ICD-10-CM | POA: Diagnosis not present

## 2023-04-29 DIAGNOSIS — G8929 Other chronic pain: Secondary | ICD-10-CM

## 2023-04-29 DIAGNOSIS — M549 Dorsalgia, unspecified: Secondary | ICD-10-CM | POA: Diagnosis not present

## 2023-04-29 NOTE — Progress Notes (Signed)
Patient ID: Carla Hanson, female    DOB: September 30, 1966, 57 y.o.   MRN: [id number]   Chief Complaint  Patient presents with   Advice Only   Breast Problem    The patient is a 57 year old female here for consultation for panniculectomy.  The patient was 335 pounds and over the past 1 to 2 years has decreased her weight to 286 pounds she is planning on losing another 100 pounds.  [facility]  She has extreme burning redness and skin breakdown in her folds.  This is seen on the pictures.  She complains of back pain with rashes.  She uses all kinds of creams and powders to try and mitigate the situation.  Her last hemoglobin A1c was 6.3 in May 2024.  She quit smoking many years ago.  She is disabled from a stroke that she had a few years ago.  Her previous surgery includes 2 C-sections and a lipoma removal of the upper abdominal area.  It appears that her C-section was a vertical incision according to the scar.  The abdomen is too large for me to determine whether or not she has a hernia but she does not have 1 according to her history.      Review of Systems  Constitutional:  Positive for activity change. Negative for appetite change.  HENT: Negative.    Eyes: Negative.   Respiratory: Negative.    Cardiovascular: Negative.   Gastrointestinal: Negative.   Endocrine: Negative.   Genitourinary: Negative.   Musculoskeletal: Negative.     Past Medical History:  Diagnosis Date   Alcohol abuse    Anxiety    Back pain    Bipolar disorder (HCC)    Bronchitis    COPD exacerbation (HCC) 05/10/2016   Drug use    History of blood clots    Hyperlipidemia    Hypertension    Influenza A 12/01/2018   Joint pain    Left leg DVT (HCC) 09/29/2013   Lipoma    Abdomen   LIPOMA 01/20/2008   Obesity    Occasional tremors 05/12/2019   Osteoarthritis of right knee    Other fatigue    Painful menstrual periods 01/05/2012   Pneumonia     Prediabetes    Seizure (HCC)    Shortness of breath on exertion    Stroke (HCC)    Tobacco abuse     Past Surgical History:  Procedure Laterality Date   CESAREAN SECTION     CYSTECTOMY     LIPOMA EXCISION  03/2011   ORIF ANKLE FRACTURE  03/13/2012   Procedure: OPEN REDUCTION INTERNAL FIXATION (ORIF) ANKLE FRACTURE;  Surgeon: Kerrin Champagne, MD;  Location: [facility];  Service: Orthopedics;  Laterality: Right;   TUBAL LIGATION        Current Outpatient Medications:    acetaminophen (TYLENOL) 325 MG tablet, Take 650 mg by mouth every 6 (six) hours as needed for mild pain., Disp: , Rfl:    aspirin EC 81 MG EC tablet, Take 1 tablet (81 mg total) by mouth daily., Disp: 30 tablet, Rfl: 12   cetirizine (ZYRTEC) 10 MG tablet, TAKE 1 TABLET(10 MG) BY MOUTH DAILY, Disp: 90 tablet, Rfl: 1   DULERA 100-5 MCG/ACT AERO, INHALE 2 PUFFS BY MOUTH TWICE DAILY, Disp: 13 g, Rfl: 4   ferrous sulfate 325 (65 FE) MG tablet, Take 325 mg by mouth daily with breakfast., Disp: ,  Rfl:    hydrochlorothiazide (HYDRODIURIL) 25 MG tablet, Take 1 tablet (25 mg total) by mouth daily., Disp: 90 tablet, Rfl: 1   losartan (COZAAR) 50 MG tablet, TAKE 1 TABLET(50 MG) BY MOUTH DAILY, Disp: 90 tablet, Rfl: 0   metFORMIN (GLUCOPHAGE) 500 MG tablet, TAKE 1 TABLET(500 MG) BY MOUTH THREE TIMES DAILY WITH MEALS, Disp: 270 tablet, Rfl: 0   rosuvastatin (CRESTOR) 20 MG tablet, Take 1 tablet (20 mg total) by mouth daily., Disp: 90 tablet, Rfl: 0   tiZANidine (ZANAFLEX) 4 MG tablet, Take 1 tablet (4 mg total) by mouth at bedtime., Disp: 4 tablet, Rfl: 0   Vitamin D, Ergocalciferol, (DRISDOL) 1.25 MG (50000 UNIT) CAPS capsule, Over-the-counter.  Take 1 capsule every 14 days., Disp: 30 capsule, Rfl: 0   Objective:   Vitals:   04/29/23 1354  BP: (!) 165/62  Pulse: 79  SpO2: 96%    Physical Exam Vitals and nursing note reviewed.  Constitutional:      Appearance: Normal appearance.  HENT:     Head: Normocephalic and atraumatic.   Cardiovascular:     Rate and Rhythm: Normal rate.     Pulses: Normal pulses.  Pulmonary:     Effort: Pulmonary effort is normal.  Abdominal:     General: There is no distension.     Palpations: Abdomen is soft. There is no mass.     Tenderness: There is no abdominal tenderness.  Skin:    General: Skin is warm.     Capillary Refill: Capillary refill takes less than 2 seconds.     Coloration: Skin is not jaundiced.     Findings: No bruising.  Neurological:     Mental Status: She is alert and oriented to person, place, and time.  Psychiatric:        Mood and Affect: Mood normal.        Behavior: Behavior normal.        Thought Content: Thought content normal.        Judgment: Judgment normal.     Assessment & Plan:  BMI 50.0-59.9, adult (HCC)  Panniculitis  Other chronic back pain  The patient is very sweet and very motivated to continue with her weight loss journey.  She will continue her efforts and come back and see [place].  In the meantime we will send her the brochures.  I explained the difference between a panniculectomy and an abdominoplasty.  Pictures were obtained of the patient and placed in the chart with the patient's or guardian's permission.   Alena Bills Aparna Vanderweele, DO

## 2023-05-05 NOTE — Progress Notes (Signed)
TeleHealth Visit:  This visit was completed with telemedicine (audio/video) technology. Janazia has verbally consented to this TeleHealth visit. The patient is located at home, the provider is located at home. The participants in this visit include the listed provider and patient. The visit was conducted today via MyChart video.  OBESITY Cereniti is here to discuss her progress with her obesity treatment plan along with follow-up of her obesity related diagnoses.   Today's visit was # 45 Starting weight: 324 lbs Starting date: 01/03/2021 Weight at last in office visit: 285 lbs on 04/22/23 Total weight loss: 39 lbs at last in office visit on 04/22/23. Today's reported weight (05/06/23):  285 lbs  Nutrition Plan: low carbohydrate plan   Current exercise:  cardio and weights for 30-60 minutes 3-4 times per week  Interim History:  She is getting used to the low carb plan. She is getting in plenty of vegetables. Hunger is mostly satisfied. She does have days she feels "hungry". Eats 3 small meals per day plus snacks. She has eaten too many nuts. She is eating cashews rather than almonds and having them them more than once daily.  She recently saw Dr. Ulice Bold regarding panniculectomy. She was told to get closer to her goal weight. Her goal weight is 140 lbs. we discussed that this weight may be too low for her.  This was her high school weight.  Assessment/Plan:  1. Constipation Sorrel notes constipation.  She used prune juice for this which works well. Does this about once monthly.  Reports good intake of water and vegetables. She does not want to take medicine for her constipation.  Plan: I recommended MiraLAX but she would rather continue prune juice. Continue prune juice as needed. Continue good fiber and water intake.   2. Hypertension Hypertension reasonably well controlled.  She had a high blood pressure reading on June 25 but blood pressures were good 3 visits  prior. Medication(s): Losartan 50 mg, HCTZ 25 mg daily  BP Readings from Last 3 Encounters:  04/29/23 (!) 165/62  04/22/23 113/78  03/25/23 128/75   Lab Results  Component Value Date   CREATININE 0.56 (L) 03/25/2023   CREATININE 0.53 (L) 12/18/2022   CREATININE 0.55 (L) 07/22/2022   No results found for: "GFR"  Plan: Continue all antihypertensives at current dosages. Refill losartan 50 mg daily  3. Morbid Obesity: Current BMI 52 Kennice is currently in the action stage of change. As such, her goal is to continue with weight loss efforts.  She has agreed to low carbohydrate plan.  1.  Discussed snack ideas. 2.  Limit serving of cashews to 1/4 cup/day. 3.  May make Iced coffee at home using Premier protein for creamer.  May have 1 cup daily. 4.  May have 3 black olives per day (15 cal).  Exercise goals:  as is  Behavioral modification strategies: meal planning , better snacking choices, planning for success, and measure portion sizes.  Cela has agreed to follow-up with our clinic in 2 weeks.  No orders of the defined types were placed in this encounter.   Medications Discontinued During This Encounter  Medication Reason   losartan (COZAAR) 50 MG tablet Reorder     Meds ordered this encounter  Medications   losartan (COZAAR) 50 MG tablet    Sig: TAKE 1 TABLET(50 MG) BY MOUTH DAILY    Dispense:  90 tablet    Refill:  0    Order Specific Question:   Supervising Provider  Answer:   Glennis Brink [[id number]]      Objective:   VITALS: Per patient if applicable, see vitals. GENERAL: Alert and in no acute distress. CARDIOPULMONARY: No increased WOB. Speaking in clear sentences.  PSYCH: Pleasant and cooperative. Speech normal rate and rhythm. Affect is appropriate. Insight and judgement are appropriate. Attention is focused, linear, and appropriate.  NEURO: Oriented as arrived to appointment on time with no prompting.   Attestation Statements:   Reviewed by  clinician on day of visit: allergies, medications, problem list, medical history, surgical history, family history, social history, and previous encounter notes.  This was prepared with the assistance of Engineer, civil (consulting).  Occasional wrong-word or sound-a-like substitutions may have occurred due to the inherent limitations of voice recognition software.

## 2023-05-06 ENCOUNTER — Telehealth (INDEPENDENT_AMBULATORY_CARE_PROVIDER_SITE_OTHER): Payer: Medicaid Other | Admitting: Family Medicine

## 2023-05-06 ENCOUNTER — Encounter (INDEPENDENT_AMBULATORY_CARE_PROVIDER_SITE_OTHER): Payer: Self-pay | Admitting: Family Medicine

## 2023-05-06 DIAGNOSIS — Z6841 Body Mass Index (BMI) 40.0 and over, adult: Secondary | ICD-10-CM

## 2023-05-06 DIAGNOSIS — K5909 Other constipation: Secondary | ICD-10-CM | POA: Diagnosis not present

## 2023-05-06 DIAGNOSIS — I10 Essential (primary) hypertension: Secondary | ICD-10-CM | POA: Diagnosis not present

## 2023-05-06 MED ORDER — LOSARTAN POTASSIUM 50 MG PO TABS
ORAL_TABLET | ORAL | 0 refills | Status: DC
Start: 2023-05-06 — End: 2023-06-24

## 2023-05-18 ENCOUNTER — Encounter: Payer: Self-pay | Admitting: Family Medicine

## 2023-05-19 ENCOUNTER — Other Ambulatory Visit: Payer: Self-pay | Admitting: Family Medicine

## 2023-05-19 MED ORDER — ALBUTEROL SULFATE HFA 108 (90 BASE) MCG/ACT IN AERS: 2.0000 | INHALATION_SPRAY | Freq: Four times a day (QID) | RESPIRATORY_TRACT | Status: DC | PRN

## 2023-05-19 MED ORDER — VENTOLIN HFA 108 (90 BASE) MCG/ACT IN AERS: 2.0000 | INHALATION_SPRAY | Freq: Four times a day (QID) | RESPIRATORY_TRACT | 4 refills | Status: DC | PRN

## 2023-05-19 MED ORDER — ALBUTEROL SULFATE (2.5 MG/3ML) 0.083% IN NEBU: 2.5000 mg | INHALATION_SOLUTION | Freq: Four times a day (QID) | RESPIRATORY_TRACT | 2 refills | Status: DC | PRN

## 2023-05-19 NOTE — Progress Notes (Signed)
TeleHealth Visit:  This visit was completed with telemedicine (audio/video) technology. Carla Hanson has verbally consented to this TeleHealth visit. The patient is located at home, the provider is located at home. The participants in this visit include the listed provider and patient. The visit was conducted today via MyChart video.  OBESITY Carla Hanson is here to discuss her progress with her obesity treatment plan along with follow-up of her obesity related diagnoses.   Today's visit was # 46 Starting weight: 324 lbs Starting date: 01/03/21 Weight at last in office visit: 285 lbs on 04/22/23 Total weight loss: 39 lbs at last in office visit on 04/22/23. Today's reported weight (05/20/23):  283 lbs  Nutrition Plan: low carbohydrate plan - 90% adherence.  Current exercise:  cardio (Nu Step for 30 minutes) and weights for 30-60 minutes 3-4 times per week   Interim History:  Having back pain- unsure what caused it.  She loves eating low carb now that she has adjusted to it. She is eating lot of cucumbers and other vegetables. Denies constipation. Has reflux if she eats too late says she likes to have her bigger meal at breakfast or lunch. Does not like Skinny Girl dressing. Appetite and cravings well controlled.  Typical day of food: Breakfast:  Eating Steak-Ums with eggs- 3 pieces. She is going to start steaming it to reduce fat. Lunch: Salad with chicken, eggs, or hamburger patty. Using 50 cal dressing/2 tbsp. Dinner: hardboiled eggs, cheese  Snacks: 1/4 cup almonds  Assessment/Plan:  1. Prediabetes Last A1c was 6.3 on 03/25/2023. Medication(s): Metformin 500 mg 3 times daily with meals. Polyphagia:No.  Also denies cravings. Lab Results  Component Value Date   HGBA1C 6.3 (H) 03/25/2023   HGBA1C 6.1 (H) 12/18/2022   HGBA1C 6.3 (H) 07/22/2022   HGBA1C 6.1 (H) 03/04/2022   HGBA1C 6.2 (H) 11/20/2021   Lab Results  Component Value Date   INSULIN 46.3 (H) 03/25/2023   INSULIN  48.0 (H) 12/18/2022   INSULIN 62.0 (H) 07/22/2022   INSULIN 81.0 (H) 03/04/2022   INSULIN 60.8 (H) 11/20/2021    Plan: Continue metformin 500 mg 3 times daily with meals.   2. Morbid Obesity: Current BMI 52  Carla Hanson is currently in the action stage of change. As such, her goal is to continue with weight loss efforts.  She has agreed to low carbohydrate plan.  1. May use [employer] light dressing- 2 tbsp.  Exercise goals:  as is  Behavioral modification strategies: increasing lean protein intake, decreasing simple carbohydrates , meal planning , and planning for success.  Carla Hanson has agreed to follow-up with our clinic in 2 weeks.  No orders of the defined types were placed in this encounter.   There are no discontinued medications.   No orders of the defined types were placed in this encounter.     Objective:   VITALS: Per patient if applicable, see vitals. GENERAL: Alert and in no acute distress. CARDIOPULMONARY: No increased WOB. Speaking in clear sentences.  PSYCH: Pleasant and cooperative. Speech normal rate and rhythm. Affect is appropriate. Insight and judgement are appropriate. Attention is focused, linear, and appropriate.  NEURO: Oriented as arrived to appointment on time with no prompting.   Attestation Statements:   Reviewed by clinician on day of visit: allergies, medications, problem list, medical history, surgical history, family history, social history, and previous encounter notes.   This was prepared with the assistance of Engineer, civil (consulting).  Occasional wrong-word or sound-a-like substitutions may have occurred due to  the inherent limitations of voice recognition software.

## 2023-05-20 ENCOUNTER — Encounter (INDEPENDENT_AMBULATORY_CARE_PROVIDER_SITE_OTHER): Payer: Self-pay | Admitting: Family Medicine

## 2023-05-20 ENCOUNTER — Telehealth (INDEPENDENT_AMBULATORY_CARE_PROVIDER_SITE_OTHER): Payer: Medicaid Other | Admitting: Family Medicine

## 2023-05-20 DIAGNOSIS — R7303 Prediabetes: Secondary | ICD-10-CM | POA: Diagnosis not present

## 2023-05-20 DIAGNOSIS — Z6841 Body Mass Index (BMI) 40.0 and over, adult: Secondary | ICD-10-CM

## 2023-06-03 ENCOUNTER — Ambulatory Visit (INDEPENDENT_AMBULATORY_CARE_PROVIDER_SITE_OTHER): Payer: Medicaid Other | Admitting: Family Medicine

## 2023-06-17 NOTE — Progress Notes (Signed)
TeleHealth Visit:  This visit was completed with telemedicine (audio/video) technology. Carla Hanson has verbally consented to this TeleHealth visit. The patient is located at home, the provider is located at home. The participants in this visit include the listed provider and patient. The visit was conducted today via MyChart video.  OBESITY Carla Hanson is here to discuss her progress with her obesity treatment plan along with follow-up of her obesity related diagnoses.   Today's visit was # 47 Starting weight: 324 lbs Starting date: 01/03/21 Weight at last in office visit: 285 lbs on 04/22/23 Total weight loss: 39 lbs at last in office visit on 04/22/23. Reported weight (05/20/23 virtual visit):  283 lbs Today's reported weight (06/18/23):  280 lbs  Nutrition Plan: low carbohydrate plan   Current exercise:  cardio (Nu Step for 30 minutes) and weights for 30 minutes 3 times per week   Interim History:  She has been doing very well with weight loss since she started the lower carb plan. She would like to incorporate avocado into her diet occasionally. She is down to one cup of coffee daily. Uses Silk Almond creamer (25 cal/4 carbs but she has a larger serving than this). She is trying to cut it out altogether. She is eating salads with chicken. Snacks on cucumbers. Mostly eats eggs, chicken and beef. Has one meal and protein snacks daily. Limiting nuts to 1/4 cup once daily. Has some sugar cravings but these are better than they used to be.   Hunger controlled: well controlled. Cravings controlled:  moderately controlled.  Assessment/Plan:  1. Prediabetes Last A1c was 6.3.   Medication(s): Metformin 500 mg 3 x daily with meals Polyphagia:No Lab Results  Component Value Date   HGBA1C 6.3 (H) 03/25/2023   HGBA1C 6.1 (H) 12/18/2022   HGBA1C 6.3 (H) 07/22/2022   HGBA1C 6.1 (H) 03/04/2022   HGBA1C 6.2 (H) 11/20/2021   Lab Results  Component Value Date   INSULIN 46.3 (H)  03/25/2023   INSULIN 48.0 (H) 12/18/2022   INSULIN 62.0 (H) 07/22/2022   INSULIN 81.0 (H) 03/04/2022   INSULIN 60.8 (H) 11/20/2021    Plan: Continue and refill Metformin 500 mg 3 x daily with meals Continue low-carb plan.  2. Hypertension Hypertension reasonably well controlled.  Had a bit of blood pressure recently but usually well-controlled. Medication(s): HCTZ 25 mg daily, losartan 50 mg daily.  BP Readings from Last 3 Encounters:  04/29/23 (!) 165/62  04/22/23 113/78  03/25/23 128/75   Lab Results  Component Value Date   CREATININE 0.56 (L) 03/25/2023   CREATININE 0.53 (L) 12/18/2022   CREATININE 0.55 (L) 07/22/2022   No results found for: "GFR"  Plan: Continue all antihypertensives at current dosages.  3. Morbid Obesity: Current BMI 52  Carla Hanson is currently in the action stage of change. As such, her goal is to continue with weight loss efforts.  She has agreed to low carbohydrate plan.   1. May have have 1/4 avocado occasionally.  Exercise goals:  as is  Behavioral modification strategies: increasing lean protein intake, decreasing simple carbohydrates , and decrease liquid calories.  [name] has agreed to follow-up with our clinic in 1 weeks.  No orders of the defined types were placed in this encounter.   Medications Discontinued During This Encounter  Medication Reason   metFORMIN (GLUCOPHAGE) 500 MG tablet Reorder     Meds ordered this encounter  Medications   metFORMIN (GLUCOPHAGE) 500 MG tablet    Sig: TAKE 1 TABLET(500 MG) BY MOUTH  THREE TIMES DAILY WITH MEALS    Dispense:  270 tablet    Refill:  0    **Patient requests 90 days supply**    Order Specific Question:   Supervising Provider    Answer:   Glennis Brink [[id number]]      Objective:   VITALS: Per patient if applicable, see vitals. GENERAL: Alert and in no acute distress. CARDIOPULMONARY: No increased WOB. Speaking in clear sentences.  PSYCH: Pleasant and cooperative. Speech normal  rate and rhythm. Affect is appropriate. Insight and judgement are appropriate. Attention is focused, linear, and appropriate.  NEURO: Oriented as arrived to appointment on time with no prompting.   Attestation Statements:   Reviewed by clinician on day of visit: allergies, medications, problem list, medical history, surgical history, family history, social history, and previous encounter notes.   This was prepared with the assistance of Engineer, civil (consulting).  Occasional wrong-word or sound-a-like substitutions may have occurred due to the inherent limitations of voice recognition software.

## 2023-06-18 ENCOUNTER — Encounter (INDEPENDENT_AMBULATORY_CARE_PROVIDER_SITE_OTHER): Payer: Self-pay | Admitting: Family Medicine

## 2023-06-18 ENCOUNTER — Telehealth (INDEPENDENT_AMBULATORY_CARE_PROVIDER_SITE_OTHER): Payer: Medicaid Other | Admitting: Family Medicine

## 2023-06-18 DIAGNOSIS — Z6841 Body Mass Index (BMI) 40.0 and over, adult: Secondary | ICD-10-CM

## 2023-06-18 DIAGNOSIS — R7303 Prediabetes: Secondary | ICD-10-CM | POA: Diagnosis not present

## 2023-06-18 DIAGNOSIS — I10 Essential (primary) hypertension: Secondary | ICD-10-CM

## 2023-06-18 MED ORDER — METFORMIN HCL 500 MG PO TABS
ORAL_TABLET | ORAL | 0 refills | Status: DC
Start: 2023-06-18 — End: 2023-07-29

## 2023-06-24 ENCOUNTER — Encounter (INDEPENDENT_AMBULATORY_CARE_PROVIDER_SITE_OTHER): Payer: Self-pay | Admitting: Family Medicine

## 2023-06-24 ENCOUNTER — Ambulatory Visit (INDEPENDENT_AMBULATORY_CARE_PROVIDER_SITE_OTHER): Payer: Medicaid Other | Admitting: Family Medicine

## 2023-06-24 VITALS — BP 120/82 | HR 63 | Temp 98.4°F | Ht 62.0 in | Wt 277.0 lb

## 2023-06-24 DIAGNOSIS — I10 Essential (primary) hypertension: Secondary | ICD-10-CM | POA: Diagnosis not present

## 2023-06-24 DIAGNOSIS — E7849 Other hyperlipidemia: Secondary | ICD-10-CM | POA: Diagnosis not present

## 2023-06-24 DIAGNOSIS — E669 Obesity, unspecified: Secondary | ICD-10-CM | POA: Diagnosis not present

## 2023-06-24 DIAGNOSIS — Z6841 Body Mass Index (BMI) 40.0 and over, adult: Secondary | ICD-10-CM

## 2023-06-24 MED ORDER — HYDROCHLOROTHIAZIDE 25 MG PO TABS
25.0000 mg | ORAL_TABLET | Freq: Every day | ORAL | 0 refills | Status: DC
Start: 2023-06-24 — End: 2023-08-19

## 2023-06-24 MED ORDER — LOSARTAN POTASSIUM 50 MG PO TABS: ORAL_TABLET | ORAL | 0 refills | Status: DC

## 2023-06-25 ENCOUNTER — Telehealth (INDEPENDENT_AMBULATORY_CARE_PROVIDER_SITE_OTHER): Payer: Medicaid Other | Admitting: Family Medicine

## 2023-06-25 NOTE — Progress Notes (Signed)
Chief Complaint:   OBESITY Carla Hanson is here to discuss her progress with her obesity treatment plan along with follow-up of her obesity related diagnoses. Carla Hanson is on following a lower carbohydrate, vegetable and lean protein rich diet plan and states she is following her eating plan approximately 90% of the time. Carla Hanson states she is doing cardio and weights for 60 minutes 5 times per week.  Today's visit was #: 48 Starting weight: 324 lbs Starting date: 01/03/2021 Today's weight: 277 lbs Today's date: 06/24/2023 Total lbs lost to date: 47 Total lbs lost since last in-office visit: 8  Interim History: Patient is doing well with her weight loss on her low carbohydrate and low-fat plan.  She is eating lean protein and vegetables.  Her hunger is well-controlled.  She is exercising regularly and her energy is good.  Subjective:   1. Essential hypertension Patient's blood pressure is controlled on her medications, and with her diet, exercise, and weight loss.  She has no symptoms of hypotension.  2. Other hyperlipidemia Patient is on Crestor and she is doing well on her diet.  She is eating lean proteins with less cholesterol.  No medication side effects were noted.  Assessment/Plan:   1. Essential hypertension Patient will continue her medications, and we will refill losartan and hydrochlorothiazide for 90 days.  We will recheck labs in 1 month.  - losartan (COZAAR) 50 MG tablet; TAKE 1 TABLET(50 MG) BY MOUTH DAILY  Dispense: 90 tablet; Refill: 0 - hydrochlorothiazide (HYDRODIURIL) 25 MG tablet; Take 1 tablet (25 mg total) by mouth daily.  Dispense: 90 tablet; Refill: 0  2. Other hyperlipidemia Patient will continue Crestor as is, and we will recheck labs in 1 month.  3. BMI 50.0-59.9, adult (HCC)  4. Obesity, Beginning BMI 57.4 Carla Hanson is currently in the action stage of change. As such, her goal is to continue with weight loss efforts. She has agreed to following a lower  carbohydrate, vegetable and lean protein rich diet plan.   Exercise goals: As is.   Behavioral modification strategies: increasing lean protein intake.  Carla Hanson has agreed to follow-up with our clinic in 4 weeks. She was informed of the importance of frequent follow-up visits to maximize her success with intensive lifestyle modifications for her multiple health conditions.   Objective:   Blood pressure 120/82, pulse 63, temperature 98.4 F (36.9 C), height 5\' 2"  (1.575 m), weight 277 lb (125.6 kg), SpO2 96%. Body mass index is 50.66 kg/m.  Lab Results  Component Value Date   CREATININE 0.56 (L) 03/25/2023   BUN 14 03/25/2023   NA 140 03/25/2023   K 4.2 03/25/2023   CL 102 03/25/2023   CO2 27 03/25/2023   Lab Results  Component Value Date   ALT 14 03/25/2023   AST 14 03/25/2023   ALKPHOS 50 03/25/2023   BILITOT 0.3 03/25/2023   Lab Results  Component Value Date   HGBA1C 6.3 (H) 03/25/2023   HGBA1C 6.1 (H) 12/18/2022   HGBA1C 6.3 (H) 07/22/2022   HGBA1C 6.1 (H) 03/04/2022   HGBA1C 6.2 (H) 11/20/2021   Lab Results  Component Value Date   INSULIN 46.3 (H) 03/25/2023   INSULIN 48.0 (H) 12/18/2022   INSULIN 62.0 (H) 07/22/2022   INSULIN 81.0 (H) 03/04/2022   INSULIN 60.8 (H) 11/20/2021   Lab Results  Component Value Date   TSH 1.630 07/22/2022   Lab Results  Component Value Date   CHOL 138 03/25/2023   HDL 44 03/25/2023  LDLCALC 72 03/25/2023   TRIG 121 03/25/2023   CHOLHDL 5.7 (H) 05/15/2021   Lab Results  Component Value Date   VD25OH 63.8 03/25/2023   VD25OH 56.8 12/18/2022   VD25OH 44.6 07/22/2022   Lab Results  Component Value Date   WBC 7.1 07/22/2022   HGB 12.7 07/22/2022   HCT 39.9 07/22/2022   MCV 81 07/22/2022   PLT 314 07/22/2022   Lab Results  Component Value Date   IRON 32 12/03/2016   TIBC 363 12/03/2016   FERRITIN 13 (L) 02/09/2018   Attestation Statements:   Reviewed by clinician on day of visit: allergies, medications,  problem list, medical history, surgical history, family history, social history, and previous encounter notes.   I, Burt Knack, am acting as transcriptionist for Quillian Quince, MD.  I have reviewed the above documentation for accuracy and completeness, and I agree with the above. -  Quillian Quince, MD

## 2023-06-28 ENCOUNTER — Other Ambulatory Visit: Payer: Self-pay | Admitting: Family Medicine

## 2023-06-28 DIAGNOSIS — E7849 Other hyperlipidemia: Secondary | ICD-10-CM

## 2023-07-10 ENCOUNTER — Telehealth (INDEPENDENT_AMBULATORY_CARE_PROVIDER_SITE_OTHER): Payer: Medicaid Other | Admitting: Family Medicine

## 2023-07-10 ENCOUNTER — Telehealth: Payer: Self-pay

## 2023-07-10 ENCOUNTER — Encounter (INDEPENDENT_AMBULATORY_CARE_PROVIDER_SITE_OTHER): Payer: Self-pay | Admitting: Family Medicine

## 2023-07-10 DIAGNOSIS — R7303 Prediabetes: Secondary | ICD-10-CM

## 2023-07-10 DIAGNOSIS — Z6841 Body Mass Index (BMI) 40.0 and over, adult: Secondary | ICD-10-CM | POA: Diagnosis not present

## 2023-07-10 DIAGNOSIS — E782 Mixed hyperlipidemia: Secondary | ICD-10-CM | POA: Diagnosis not present

## 2023-07-10 MED ORDER — WEGOVY 0.25 MG/0.5ML ~~LOC~~ SOAJ: 0.2500 mg | SUBCUTANEOUS | 0 refills | Status: DC

## 2023-07-10 NOTE — Telephone Encounter (Signed)
Started PA for [employer] via covermymeds 

## 2023-07-10 NOTE — Progress Notes (Signed)
TeleHealth Visit:  This visit was completed with telemedicine (audio/video) technology. Carla Hanson has verbally consented to this TeleHealth visit. The patient is located at home, the provider is located at home. The participants in this visit include the listed provider and patient. The visit was conducted today via MyChart video.  OBESITY Carla Hanson is here to discuss her progress with her obesity treatment plan along with follow-up of her obesity related diagnoses.   Today's visit was # 49 Starting weight: 324 lbs Starting date: 01/03/2021 Weight at last in office visit: 277 lbs on 06/24/23 Total weight loss: 47 lbs at last in office visit on 06/24/23. Today's reported weight (07/10/23): none reported  Nutrition Plan: low carbohydrate plan  Current exercise:  cardio and weights for 60 minutes 6 times per week  Interim History:  She is doing very well on the low carb plan. She lost 8 lbs last OV. She is eating plenty of vegetables. Occasionally has a burger on a keto bun. Occasionally has 1/2 cup of berries. Has a protein serving at least 3 times per day. Water intake is very good.  She has lost 4 dress sizes. She is doing fantastic with exercise and works out at the gym 3 days per week and at home 3 days per week.  We discussed that she now has coverage for [facility] with Medicaid as her primary insurance and she would like to try this medication. Lev denies personal or family history of thyroid cancer, history of pancreatitis, or current cholelithiasis. [name] was informed of the most common side effects (nausea, constipation, diarrhea).  Assessment/Plan:  1.  Mixed hyperlipidemia LDL is at goal.  HDL a bit low at 44 and triglycerides normal at 121. Medication(s): Crestor 20 mg daily  Lab Results  Component Value Date   CHOL 138 03/25/2023   HDL 44 03/25/2023   LDLCALC 72 03/25/2023   TRIG 121 03/25/2023   CHOLHDL 5.7 (H) 05/15/2021   CHOLHDL 5.4 (H) 12/19/2020    CHOLHDL 5.3 (H) 04/11/2020   Lab Results  Component Value Date   ALT 14 03/25/2023   AST 14 03/25/2023   ALKPHOS 50 03/25/2023   BILITOT 0.3 03/25/2023   The ASCVD Risk score (Arnett DK, et al., 2019) failed to calculate for the following reasons:   The patient has a prior MI or stroke diagnosis  Plan: Continue statin.  2. Prediabetes Last A1c was 6.3.  Medication(s): Metformin 500 mg 3 times per day with food  Lab Results  Component Value Date   HGBA1C 6.3 (H) 03/25/2023   HGBA1C 6.1 (H) 12/18/2022   HGBA1C 6.3 (H) 07/22/2022   HGBA1C 6.1 (H) 03/04/2022   HGBA1C 6.2 (H) 11/20/2021   Lab Results  Component Value Date   INSULIN 46.3 (H) 03/25/2023   INSULIN 48.0 (H) 12/18/2022   INSULIN 62.0 (H) 07/22/2022   INSULIN 81.0 (H) 03/04/2022   INSULIN 60.8 (H) 11/20/2021    Plan: Continue and refill Metformin 500 mg 3 times per day with food. Discussed she may possibly be able to reduce her dose of metformin if she starts the [facility].   3. Morbid Obesity: Current BMI 50 Pharmacotherapy Plan Start  Wegovy 0.25 mg SQ weekly  Ova is currently in the action stage of change. As such, her goal is to continue with weight loss efforts.  She has agreed to low carbohydrate plan.  Exercise goals:  as is  Behavioral modification strategies: increasing lean protein intake, decreasing simple carbohydrates , and planning for success.  Vicie has agreed to follow-up with our clinic in 3 weeks.  No orders of the defined types were placed in this encounter.   There are no discontinued medications.   Meds ordered this encounter  Medications   Semaglutide-Weight Management (WEGOVY) 0.25 MG/0.5ML SOAJ    Sig: Inject 0.25 mg into the skin once a week.    Dispense:  2 mL    Refill:  0    Order Specific Question:   Supervising Provider    Answer:   Glennis Brink [[id number]]      Objective:   VITALS: Per patient if applicable, see vitals. GENERAL: Alert and in no acute  distress. CARDIOPULMONARY: No increased WOB. Speaking in clear sentences.  PSYCH: Pleasant and cooperative. Speech normal rate and rhythm. Affect is appropriate. Insight and judgement are appropriate. Attention is focused, linear, and appropriate.  NEURO: Oriented as arrived to appointment on time with no prompting.   Attestation Statements:   Reviewed by clinician on day of visit: allergies, medications, problem list, medical history, surgical history, family history, social history, and previous encounter notes.  This was prepared with the assistance of Engineer, civil (consulting).  Occasional wrong-word or sound-a-like substitutions may have occurred due to the inherent limitations of voice recognition software.

## 2023-07-15 ENCOUNTER — Encounter (INDEPENDENT_AMBULATORY_CARE_PROVIDER_SITE_OTHER): Payer: Self-pay

## 2023-07-24 NOTE — Telephone Encounter (Signed)
error 

## 2023-07-29 ENCOUNTER — Ambulatory Visit (INDEPENDENT_AMBULATORY_CARE_PROVIDER_SITE_OTHER): Payer: Medicaid Other | Admitting: Family Medicine

## 2023-07-29 ENCOUNTER — Encounter (INDEPENDENT_AMBULATORY_CARE_PROVIDER_SITE_OTHER): Payer: Self-pay | Admitting: Family Medicine

## 2023-07-29 ENCOUNTER — Other Ambulatory Visit (HOSPITAL_COMMUNITY): Payer: Self-pay

## 2023-07-29 VITALS — BP 143/74 | HR 66 | Temp 98.2°F | Ht 62.0 in | Wt 278.0 lb

## 2023-07-29 DIAGNOSIS — E669 Obesity, unspecified: Secondary | ICD-10-CM

## 2023-07-29 DIAGNOSIS — Z6841 Body Mass Index (BMI) 40.0 and over, adult: Secondary | ICD-10-CM | POA: Diagnosis not present

## 2023-07-29 DIAGNOSIS — E559 Vitamin D deficiency, unspecified: Secondary | ICD-10-CM | POA: Diagnosis not present

## 2023-07-29 DIAGNOSIS — E538 Deficiency of other specified B group vitamins: Secondary | ICD-10-CM | POA: Diagnosis not present

## 2023-07-29 DIAGNOSIS — R7303 Prediabetes: Secondary | ICD-10-CM

## 2023-07-29 MED ORDER — METFORMIN HCL 500 MG PO TABS
ORAL_TABLET | ORAL | 0 refills | Status: DC
Start: 2023-07-29 — End: 2023-08-19

## 2023-07-29 MED ORDER — WEGOVY 0.25 MG/0.5ML ~~LOC~~ SOAJ
0.2500 mg | SUBCUTANEOUS | 0 refills | Status: DC
Start: 2023-07-29 — End: 2023-08-19
  Filled 2023-07-29: qty 2, 28d supply, fill #0

## 2023-07-29 NOTE — Progress Notes (Signed)
Chief Complaint:   OBESITY Carla Hanson is here to discuss her progress with her obesity treatment plan along with follow-up of her obesity related diagnoses. Carla Hanson is on following a lower carbohydrate, vegetable and lean protein rich diet plan and states she is following her eating plan approximately 85-90% of the time. Carla Hanson states she is lifting weights and doing cardio for 60 minutes 5 times per week.  Today's visit was #: 50 Starting weight: 324 lbs Starting date: 01/03/2021 Today's weight: 278 lbs Today's date: 07/29/2023 Total lbs lost to date: 46 Total lbs lost since last in-office visit: 0  Interim History: Patient was off her normal routine while caring for her mother who is recovering from surgery.  She was not able to follow her eating plan as closely, but she was still mindful of her food choices and exercise.  She has not started [place] due to pharmacy shortages.  Subjective:   1. Pre-diabetes Patient is on metformin, and she is working on her diet and weight loss.  She is due for labs today.  2. Vitamin D deficiency Patient is on vitamin D, and she is due for labs.  She denies nausea, vomiting, or muscle weakness.  3. B12 deficiency Patient's last B12 was below goal despite following a B12 rich eating plan.  Assessment/Plan:   1. Pre-diabetes We will check labs today.  Patient will continue metformin 500 mg 3 times daily, and we will refill for 90 days.  - metFORMIN (GLUCOPHAGE) 500 MG tablet; TAKE 1 TABLET(500 MG) BY MOUTH THREE TIMES DAILY WITH MEALS  Dispense: 270 tablet; Refill: 0 - CMP14+EGFR - Insulin, random - Hemoglobin A1c  2. Vitamin D deficiency We will check labs today, and patient will continue vitamin D.  We will follow-up at her next visit in 4 weeks.  - VITAMIN D 25 Hydroxy (Vit-D Deficiency, Fractures)  3. B12 deficiency We will check labs today.  Patient will continue her B12 rich diet, may need additional supplementation.  We will follow-up  at her next visit in 4 weeks.  - Vitamin B12  4. BMI 50.0-59.9, adult (HCC)  5. Obesity, Beginning BMI 57.4 We will refill Wegovy 0.25 mg once weekly for 1 month, and will send to [facility].  - Semaglutide-Weight Management (WEGOVY) 0.25 MG/0.5ML SOAJ; Inject 0.25 mg into the skin once a week.  Dispense: 2 mL; Refill: 0  Carla Hanson is currently in the action stage of change. As such, her goal is to continue with weight loss efforts. She has agreed to change to keeping a food journal and adhering to recommended goals of 1400-1500 calories and 80+ grams of protein daily.   Exercise goals: As is.  Behavioral modification strategies: increasing lean protein intake.  Carla Hanson has agreed to follow-up with our clinic in 4 weeks. She was informed of the importance of frequent follow-up visits to maximize her success with intensive lifestyle modifications for her multiple health conditions.   Carla Hanson was informed we would discuss her lab results at her next visit unless there is a critical issue that needs to be addressed sooner. Carla Hanson agreed to keep her next visit at the agreed upon time to discuss these results.  Objective:   Blood pressure (!) 143/74, pulse 66, temperature 98.2 F (36.8 C), height 5\' 2"  (1.575 m), weight 278 lb (126.1 kg), SpO2 95%. Body mass index is 50.85 kg/m.  Lab Results  Component Value Date   CREATININE 0.56 (L) 03/25/2023   BUN 14 03/25/2023  NA 140 03/25/2023   K 4.2 03/25/2023   CL 102 03/25/2023   CO2 27 03/25/2023   Lab Results  Component Value Date   ALT 14 03/25/2023   AST 14 03/25/2023   ALKPHOS 50 03/25/2023   BILITOT 0.3 03/25/2023   Lab Results  Component Value Date   HGBA1C 6.3 (H) 03/25/2023   HGBA1C 6.1 (H) 12/18/2022   HGBA1C 6.3 (H) 07/22/2022   HGBA1C 6.1 (H) 03/04/2022   HGBA1C 6.2 (H) 11/20/2021   Lab Results  Component Value Date   INSULIN 46.3 (H) 03/25/2023   INSULIN 48.0 (H) 12/18/2022   INSULIN 62.0 (H) 07/22/2022    INSULIN 81.0 (H) 03/04/2022   INSULIN 60.8 (H) 11/20/2021   Lab Results  Component Value Date   TSH 1.630 07/22/2022   Lab Results  Component Value Date   CHOL 138 03/25/2023   HDL 44 03/25/2023   LDLCALC 72 03/25/2023   TRIG 121 03/25/2023   CHOLHDL 5.7 (H) 05/15/2021   Lab Results  Component Value Date   VD25OH 63.8 03/25/2023   VD25OH 56.8 12/18/2022   VD25OH 44.6 07/22/2022   Lab Results  Component Value Date   WBC 7.1 07/22/2022   HGB 12.7 07/22/2022   HCT 39.9 07/22/2022   MCV 81 07/22/2022   PLT 314 07/22/2022   Lab Results  Component Value Date   IRON 32 12/03/2016   TIBC 363 12/03/2016   FERRITIN 13 (L) 02/09/2018   Attestation Statements:   Reviewed by clinician on day of visit: allergies, medications, problem list, medical history, surgical history, family history, social history, and previous encounter notes.   I, Burt Knack, am acting as transcriptionist for Quillian Quince, MD.  I have reviewed the above documentation for accuracy and completeness, and I agree with the above. -  Quillian Quince, MD

## 2023-07-30 LAB — CMP14+EGFR
ALT: 16 IU/L (ref 0–32)
AST: 15 IU/L (ref 0–40)
Albumin: 4 g/dL (ref 3.8–4.9)
Alkaline Phosphatase: 52 IU/L (ref 44–121)
BUN/Creatinine Ratio: 24 — ABNORMAL HIGH (ref 9–23)
BUN: 15 mg/dL (ref 6–24)
Bilirubin Total: <0.2 mg/dL (ref 0.0–1.2)
CO2: 25 mmol/L (ref 20–29)
Calcium: 9.7 mg/dL (ref 8.7–10.2)
Chloride: 102 mmol/L (ref 96–106)
Creatinine, Ser: 0.63 mg/dL (ref 0.57–1.00)
Globulin, Total: 3.7 g/dL (ref 1.5–4.5)
Glucose: 93 mg/dL (ref 70–99)
Potassium: 4 mmol/L (ref 3.5–5.2)
Sodium: 141 mmol/L (ref 134–144)
Total Protein: 7.7 g/dL (ref 6.0–8.5)
eGFR: 104 mL/min/{1.73_m2} (ref >59)

## 2023-07-30 LAB — INSULIN, RANDOM: INSULIN: 36.7 u[IU]/mL — ABNORMAL HIGH (ref 2.6–24.9)

## 2023-07-30 LAB — HEMOGLOBIN A1C
Est. average glucose Bld gHb Est-mCnc: 131 mg/dL
Hgb A1c MFr Bld: 6.2 % — ABNORMAL HIGH (ref 4.8–5.6)

## 2023-07-30 LAB — VITAMIN D 25 HYDROXY (VIT D DEFICIENCY, FRACTURES): Vit D, 25-Hydroxy: 70.4 ng/mL (ref 30.0–100.0)

## 2023-07-30 LAB — VITAMIN B12: Vitamin B-12: 349 pg/mL (ref 232–1245)

## 2023-08-12 ENCOUNTER — Telehealth (INDEPENDENT_AMBULATORY_CARE_PROVIDER_SITE_OTHER): Payer: Self-pay | Admitting: Family Medicine

## 2023-08-12 NOTE — Telephone Encounter (Signed)
Pt called stating that she needs a refill of Wegovy before her upcoming appt. Pt will take her last on Tuesday 08/19/2023. Pt also wants to know if the dosage will be changed.

## 2023-08-12 NOTE — Telephone Encounter (Signed)
It looks like her next appointment is on 10/15, so she can take her last dose on that day and we will review changing her dose at that time, unless there is something I am not understanding?

## 2023-08-14 NOTE — Telephone Encounter (Signed)
Called and spoke with patient. Informed patient of Dr> Beasley's message and patient verbalized understanding.

## 2023-08-18 DIAGNOSIS — S93401A Sprain of unspecified ligament of right ankle, initial encounter: Secondary | ICD-10-CM | POA: Diagnosis not present

## 2023-08-18 DIAGNOSIS — M25571 Pain in right ankle and joints of right foot: Secondary | ICD-10-CM | POA: Diagnosis not present

## 2023-08-19 ENCOUNTER — Other Ambulatory Visit (HOSPITAL_COMMUNITY): Payer: Self-pay

## 2023-08-19 ENCOUNTER — Other Ambulatory Visit (INDEPENDENT_AMBULATORY_CARE_PROVIDER_SITE_OTHER): Payer: Self-pay | Admitting: Family Medicine

## 2023-08-19 ENCOUNTER — Other Ambulatory Visit (INDEPENDENT_AMBULATORY_CARE_PROVIDER_SITE_OTHER): Payer: Self-pay | Admitting: *Deleted

## 2023-08-19 ENCOUNTER — Encounter (INDEPENDENT_AMBULATORY_CARE_PROVIDER_SITE_OTHER): Payer: Self-pay | Admitting: Family Medicine

## 2023-08-19 ENCOUNTER — Ambulatory Visit (INDEPENDENT_AMBULATORY_CARE_PROVIDER_SITE_OTHER): Payer: Medicaid Other | Admitting: Family Medicine

## 2023-08-19 ENCOUNTER — Telehealth (INDEPENDENT_AMBULATORY_CARE_PROVIDER_SITE_OTHER): Payer: Self-pay | Admitting: Family Medicine

## 2023-08-19 VITALS — BP 143/61 | HR 60 | Temp 98.4°F | Ht 62.0 in | Wt 273.0 lb

## 2023-08-19 DIAGNOSIS — I10 Essential (primary) hypertension: Secondary | ICD-10-CM

## 2023-08-19 DIAGNOSIS — E559 Vitamin D deficiency, unspecified: Secondary | ICD-10-CM

## 2023-08-19 DIAGNOSIS — R7303 Prediabetes: Secondary | ICD-10-CM

## 2023-08-19 DIAGNOSIS — Z6841 Body Mass Index (BMI) 40.0 and over, adult: Secondary | ICD-10-CM | POA: Diagnosis not present

## 2023-08-19 DIAGNOSIS — E669 Obesity, unspecified: Secondary | ICD-10-CM

## 2023-08-19 MED ORDER — HYDROCHLOROTHIAZIDE 25 MG PO TABS
25.0000 mg | ORAL_TABLET | Freq: Every day | ORAL | 0 refills | Status: DC
Start: 2023-08-19 — End: 2023-09-16

## 2023-08-19 MED ORDER — METFORMIN HCL 500 MG PO TABS
ORAL_TABLET | ORAL | 0 refills | Status: DC
Start: 2023-08-19 — End: 2023-10-21

## 2023-08-19 MED ORDER — LOSARTAN POTASSIUM 50 MG PO TABS
ORAL_TABLET | ORAL | 0 refills | Status: DC
Start: 2023-08-19 — End: 2023-09-16

## 2023-08-19 MED ORDER — WEGOVY 0.25 MG/0.5ML ~~LOC~~ SOAJ
0.2500 mg | SUBCUTANEOUS | 0 refills | Status: DC
  Filled 2023-08-19 (×4): qty 2, 28d supply, fill #0

## 2023-08-19 NOTE — Telephone Encounter (Signed)
Called and spoke with pharmacy, pharmacy stated again it was too early for a refill. Her insurance will cover every 23-28 days. They stated patient could get the prescription filled wherever she will  be while out of town, they could mail it to her if she like and provide them the address or she may get it filled when she return home. Per patient she will pick up the prescription when she gets home.

## 2023-08-19 NOTE — Telephone Encounter (Signed)
Called and spoke with patient, she is not sure exactly what the pharmacist is talking about but thinks she may need a PA.  Called and spoke with pharmacist, per pharmacist it is not saying patient needs a PA but the medication has been exceeded for plan coverage. Patient picked up last prescription on 07/29/2023 and it is not time for it to be refilled yet.   Returned call to patient and informed her of the above. Patient stated she is going out of town tomorrow and will not be back until 10/29. Advised patient to transfer prescription to pharmacy closer to where she will be and if they have any issues please let us know.

## 2023-08-19 NOTE — Progress Notes (Signed)
Chief Complaint:   OBESITY Carla Hanson is here to discuss her progress with her obesity treatment plan along with follow-up of her obesity related diagnoses. Carla Hanson is on keeping a food journal and adhering to recommended goals of 1400-1500 calories and 80+ grams of protein and states she is following her eating plan approximately 98% of the time. Carla Hanson states she is doing 0 minutes 0 times per week.  Today's visit was #: 51 Starting weight: 324 lbs Starting date: 01/03/2021 Today's weight: 273 lbs Today's date: 08/19/2023 Total lbs lost to date: 51 Total lbs lost since last in-office visit: 5  Interim History: Patient has done well with her weight loss.  She feels her Carla Hanson is helping to decrease both excessive hunger and cravings.  She is working on meeting her protein goals.  Her exercise is limited due to a foot injury and she is in a boot.  Subjective:   1. Pre-diabetes Patient is doing well on metformin and Wegovy.  She notes decreased polyphagia and no nausea unless she eats too much.  2. Vitamin D deficiency Patient changed to vitamin D OTC, and her last vitamin D level was at goal.  3. Essential hypertension Patient's blood pressure is elevated today.  She feels this is related to her foot pain.  Assessment/Plan:   1. Pre-diabetes Patient will continue her medications, and we will refill metformin 500 mg 3 times daily for 90 days and refill Wegovy 0.25 mg once weekly for 1 month.  - metFORMIN (GLUCOPHAGE) 500 MG tablet; TAKE 1 TABLET(500 MG) BY MOUTH THREE TIMES DAILY WITH MEALS  Dispense: 270 tablet; Refill: 0 - Semaglutide-Weight Management (WEGOVY) 0.25 MG/0.5ML SOAJ; Inject 0.25 mg into the skin once a week.  Dispense: 2 mL; Refill: 0  2. Vitamin D deficiency Patient will continue OTC vitamin D 5,000 IU daily.  3. Essential hypertension Patient will continue her medications, and we will refill losartan 50 mg once daily and hydrochlorothiazide 25 mg once daily for  1 month.  We will recheck patient's blood pressure in 3 weeks.  She will continue with her weight loss to help control her blood pressure.  - losartan (COZAAR) 50 MG tablet; TAKE 1 TABLET(50 MG) BY MOUTH DAILY  Dispense: 30 tablet; Refill: 0 - hydrochlorothiazide (HYDRODIURIL) 25 MG tablet; Take 1 tablet (25 mg total) by mouth daily.  Dispense: 30 tablet; Refill: 0  4. BMI 50.0-59.9, adult (HCC)  5. Obesity, Beginning BMI 57.4 We will refill Wegovy for 1 month.  - Semaglutide-Weight Management (WEGOVY) 0.25 MG/0.5ML SOAJ; Inject 0.25 mg into the skin once a week.  Dispense: 2 mL; Refill: 0  Carla Hanson is currently in the action stage of change. As such, her goal is to continue with weight loss efforts. She has agreed to the Category 2 Plan.   Behavioral modification strategies: increasing lean protein intake.  Carla Hanson has agreed to follow-up with our clinic in 3 to 4 weeks. She was informed of the importance of frequent follow-up visits to maximize her success with intensive lifestyle modifications for her multiple health conditions.   Objective:   Blood pressure (!) 143/61, pulse 60, temperature 98.4 F (36.9 C), height 5\' 2"  (1.575 m), weight 273 lb (123.8 kg). Body mass index is 49.93 kg/m.  Lab Results  Component Value Date   CREATININE 0.63 07/29/2023   BUN 15 07/29/2023   NA 141 07/29/2023   K 4.0 07/29/2023   CL 102 07/29/2023   CO2 25 07/29/2023   Lab Results  Component Value Date   ALT 16 07/29/2023   AST 15 07/29/2023   ALKPHOS 52 07/29/2023   BILITOT <0.2 07/29/2023   Lab Results  Component Value Date   HGBA1C 6.2 (H) 07/29/2023   HGBA1C 6.3 (H) 03/25/2023   HGBA1C 6.1 (H) 12/18/2022   HGBA1C 6.3 (H) 07/22/2022   HGBA1C 6.1 (H) 03/04/2022   Lab Results  Component Value Date   INSULIN 36.7 (H) 07/29/2023   INSULIN 46.3 (H) 03/25/2023   INSULIN 48.0 (H) 12/18/2022   INSULIN 62.0 (H) 07/22/2022   INSULIN 81.0 (H) 03/04/2022   Lab Results  Component Value  Date   TSH 1.630 07/22/2022   Lab Results  Component Value Date   CHOL 138 03/25/2023   HDL 44 03/25/2023   LDLCALC 72 03/25/2023   TRIG 121 03/25/2023   CHOLHDL 5.7 (H) 05/15/2021   Lab Results  Component Value Date   VD25OH 70.4 07/29/2023   VD25OH 63.8 03/25/2023   VD25OH 56.8 12/18/2022   Lab Results  Component Value Date   WBC 7.1 07/22/2022   HGB 12.7 07/22/2022   HCT 39.9 07/22/2022   MCV 81 07/22/2022   PLT 314 07/22/2022   Lab Results  Component Value Date   IRON 32 12/03/2016   TIBC 363 12/03/2016   FERRITIN 13 (L) 02/09/2018   Attestation Statements:   Reviewed by clinician on day of visit: allergies, medications, problem list, medical history, surgical history, family history, social history, and previous encounter notes.   I, Burt Knack, am acting as transcriptionist for Quillian Quince, MD.  I have reviewed the above documentation for accuracy and completeness, and I agree with the above. -  Quillian Quince, MD

## 2023-08-19 NOTE — Telephone Encounter (Signed)
Pt called from thge Pharmacy stating that she cannot receive her RX without an autorization from Dr. Dalbert Garnet. Pt is going out of town tomorrow. Pt took her last dose today.

## 2023-08-21 ENCOUNTER — Other Ambulatory Visit (HOSPITAL_COMMUNITY): Payer: Self-pay

## 2023-08-22 ENCOUNTER — Other Ambulatory Visit (HOSPITAL_COMMUNITY): Payer: Self-pay

## 2023-08-25 ENCOUNTER — Other Ambulatory Visit (HOSPITAL_COMMUNITY): Payer: Self-pay

## 2023-08-29 ENCOUNTER — Other Ambulatory Visit (HOSPITAL_COMMUNITY): Payer: Self-pay

## 2023-09-16 ENCOUNTER — Encounter (INDEPENDENT_AMBULATORY_CARE_PROVIDER_SITE_OTHER): Payer: Self-pay | Admitting: Family Medicine

## 2023-09-16 ENCOUNTER — Ambulatory Visit (INDEPENDENT_AMBULATORY_CARE_PROVIDER_SITE_OTHER): Payer: Medicaid Other | Admitting: Family Medicine

## 2023-09-16 VITALS — BP 135/68 | HR 83 | Temp 98.0°F | Ht 62.0 in | Wt 274.0 lb

## 2023-09-16 DIAGNOSIS — I10 Essential (primary) hypertension: Secondary | ICD-10-CM

## 2023-09-16 DIAGNOSIS — R632 Polyphagia: Secondary | ICD-10-CM | POA: Diagnosis not present

## 2023-09-16 DIAGNOSIS — H35033 Hypertensive retinopathy, bilateral: Secondary | ICD-10-CM | POA: Diagnosis not present

## 2023-09-16 DIAGNOSIS — H43813 Vitreous degeneration, bilateral: Secondary | ICD-10-CM | POA: Diagnosis not present

## 2023-09-16 DIAGNOSIS — H35373 Puckering of macula, bilateral: Secondary | ICD-10-CM | POA: Diagnosis not present

## 2023-09-16 DIAGNOSIS — Z6841 Body Mass Index (BMI) 40.0 and over, adult: Secondary | ICD-10-CM | POA: Diagnosis not present

## 2023-09-16 DIAGNOSIS — H31093 Other chorioretinal scars, bilateral: Secondary | ICD-10-CM | POA: Diagnosis not present

## 2023-09-16 DIAGNOSIS — R7303 Prediabetes: Secondary | ICD-10-CM | POA: Diagnosis not present

## 2023-09-16 DIAGNOSIS — E669 Obesity, unspecified: Secondary | ICD-10-CM | POA: Diagnosis not present

## 2023-09-16 MED ORDER — HYDROCHLOROTHIAZIDE 25 MG PO TABS
25.0000 mg | ORAL_TABLET | Freq: Every day | ORAL | 0 refills | Status: DC
Start: 2023-09-16 — End: 2023-10-13

## 2023-09-16 MED ORDER — LOSARTAN POTASSIUM 50 MG PO TABS
ORAL_TABLET | ORAL | 0 refills | Status: DC
Start: 2023-09-16 — End: 2023-10-21

## 2023-09-16 MED ORDER — WEGOVY 0.5 MG/0.5ML ~~LOC~~ SOAJ
0.5000 mg | SUBCUTANEOUS | 0 refills | Status: DC
  Filled 2023-09-19 – 2023-09-25 (×3): qty 2, 28d supply, fill #0

## 2023-09-16 NOTE — Progress Notes (Signed)
.[facility]  Office: [phone]  /  Fax: [phone]  WEIGHT SUMMARY AND BIOMETRICS  Anthropometric Measurements Height: 5\' 2"  (1.575 m) Weight: 274 lb (124.3 kg) BMI (Calculated): 50.1 Weight at Last Visit: 273 lb Weight Lost Since Last Visit: 0 Weight Gained Since Last Visit: 1 lb Starting Weight: 324 lb Total Weight Loss (lbs): 48 lb (21.8 kg)   Body Composition  Body Fat %: 57.2 % Fat Mass (lbs): 157 lbs Muscle Mass (lbs): 111.4 lbs Visceral Fat Rating : 22   Other Clinical Data Fasting: No Labs: No Today's Visit #: 54 Starting Date: 01/03/21    Chief Complaint: OBESITY   History of Present Illness   The patient, with a history of obesity, prediabetes, and hypertension, presents for a follow-up visit. She has been on [facility] for prediabetes and obesity, and Losartan and Hydrochlorothiazide for hypertension. Over the past month, she has gained one pound and reports adherence to her category two plan about 85% of the time. She has been exercising for 60 minutes five times a week, combining cardio and weights. However, she admits to a lack of structure in her eating plan due to recent travel.  The patient reports feeling "all over the place" with her eating habits, attributing this to being out of her routine for about ten days. Despite the [facility] reducing her appetite, she still experiences some cravings. She expresses concern about getting enough protein and maintaining her weight loss progress. She has been trying to manage her reduced appetite by consuming protein smoothies and nibbling throughout the day.  Her initial blood pressure reading was 149/79, but a repeat reading showed improvement to 135/68. The patient has been mindful of her eating habits during her travels, focusing more on not gaining weight rather than losing weight. She expresses a desire to continue losing excess weight and to avoid stagnation in her weight loss journey.          PHYSICAL EXAM:  Blood  pressure 135/68, pulse 83, temperature 98 F (36.7 C), height 5\' 2"  (1.575 m), weight 274 lb (124.3 kg), SpO2 93%. Body mass index is 50.12 kg/m.  DIAGNOSTIC DATA REVIEWED:  BMET    Component Value Date/Time   NA 141 07/29/2023 0748   K 4.0 07/29/2023 0748   CL 102 07/29/2023 0748   CO2 25 07/29/2023 0748   GLUCOSE 93 07/29/2023 0748   GLUCOSE 89 11/10/2016 0230   BUN 15 07/29/2023 0748   CREATININE 0.63 07/29/2023 0748   CREATININE 0.57 11/09/2014 1032   CALCIUM 9.7 07/29/2023 0748   GFRNONAA 110 12/08/2019 1210   GFRNONAA >89 12/29/2013 0942   GFRAA 127 12/08/2019 1210   GFRAA >89 12/29/2013 0942   Lab Results  Component Value Date   HGBA1C 6.2 (H) 07/29/2023   HGBA1C 6.0 12/31/2012   Lab Results  Component Value Date   INSULIN 36.7 (H) 07/29/2023   INSULIN 77.3 (H) 01/03/2021   Lab Results  Component Value Date   TSH 1.630 07/22/2022   CBC    Component Value Date/Time   WBC 7.1 07/22/2022 0749   WBC 12.0 (H) 11/10/2016 0230   RBC 4.95 07/22/2022 0749   RBC 4.56 11/10/2016 0230   HGB 12.7 07/22/2022 0749   HCT 39.9 07/22/2022 0749   PLT 314 07/22/2022 0749   MCV 81 07/22/2022 0749   MCH 25.7 (L) 07/22/2022 0749   MCH 22.6 (L) 11/10/2016 0230   MCHC 31.8 07/22/2022 0749   MCHC 31.2 11/10/2016 0230   RDW 14.4 07/22/2022  7564   Iron Studies    Component Value Date/Time   IRON 32 12/03/2016 1442   TIBC 363 12/03/2016 1442   FERRITIN 13 (L) 02/09/2018 1501   IRONPCTSAT 9 (LL) 12/03/2016 1442   Lipid Panel     Component Value Date/Time   CHOL 138 03/25/2023 0748   TRIG 121 03/25/2023 0748   HDL 44 03/25/2023 0748   CHOLHDL 5.7 (H) 05/15/2021 0929   CHOLHDL 6.3 (H) 01/08/2017 0947   VLDL 40 (H) 01/08/2017 0947   LDLCALC 72 03/25/2023 0748   Hepatic Function Panel     Component Value Date/Time   PROT 7.7 07/29/2023 0748   ALBUMIN 4.0 07/29/2023 0748   AST 15 07/29/2023 0748   ALT 16 07/29/2023 0748   ALKPHOS 52 07/29/2023 0748   BILITOT  <0.2 07/29/2023 0748      Component Value Date/Time   TSH 1.630 07/22/2022 0749   Nutritional Lab Results  Component Value Date   VD25OH 70.4 07/29/2023   VD25OH 63.8 03/25/2023   VD25OH 56.8 12/18/2022     Assessment and Plan    Obesity Currently on 0.25 mg Wegovy for obesity management. Gained one pound last month, likely due to travel and deviation from structured eating. Experiencing cravings despite reduced hunger. Increasing dose to 0.5 mg expected to help with cravings and weight management. Discussed full benefit after four doses and risks of inadequate nutrition. - Increase Wegovy to 0.5 mg - Use remaining 0.25 mg pen before switching - Monitor weight and cravings, reassess next month  Prediabetes On Wegovy for prediabetes management. Adhering to category two plan 85% of the time and exercising regularly. Increased Wegovy dose expected to aid in managing cravings and prediabetes. - Increase Wegovy to 0.5 mg - Monitor blood glucose, reassess next month  Hypertension On losartan and hydrochlorothiazide. Initial BP 149/79 mmHg, improved to 135/68 mmHg on repeat. No immediate concerns. - Refill losartan - Refill hydrochlorothiazide - Monitor BP, reassess next month  General Health Maintenance Advised to maintain healthy diet and exercise. Discussed weight management strategies during travel and holidays, focusing on not gaining weight rather than losing. - Encourage healthy eating and regular exercise - Advise on weight management during travel and holidays - Schedule next appointment for October 21, 2023 - Schedule January appointment at front desk.        She was informed of the importance of frequent follow up visits to maximize her success with intensive lifestyle modifications for her multiple health conditions.    Quillian Quince, MD

## 2023-09-19 ENCOUNTER — Other Ambulatory Visit (HOSPITAL_COMMUNITY): Payer: Self-pay

## 2023-09-23 ENCOUNTER — Telehealth (INDEPENDENT_AMBULATORY_CARE_PROVIDER_SITE_OTHER): Payer: Self-pay | Admitting: Family Medicine

## 2023-09-23 ENCOUNTER — Other Ambulatory Visit (HOSPITAL_COMMUNITY): Payer: Self-pay

## 2023-09-23 ENCOUNTER — Other Ambulatory Visit (INDEPENDENT_AMBULATORY_CARE_PROVIDER_SITE_OTHER): Payer: Self-pay | Admitting: Family Medicine

## 2023-09-23 ENCOUNTER — Telehealth (INDEPENDENT_AMBULATORY_CARE_PROVIDER_SITE_OTHER): Payer: Self-pay | Admitting: *Deleted

## 2023-09-23 DIAGNOSIS — R7303 Prediabetes: Secondary | ICD-10-CM

## 2023-09-23 NOTE — Telephone Encounter (Signed)
Your prior authorization for Reginal Lutes has been approved! More Info Personalized support and financial assistance may be available through the [employer] program. For more information, and to see program requirements. Patient has been notified.

## 2023-09-23 NOTE — Telephone Encounter (Signed)
Pt called stating that she needs to speak with Lavone Orn concerning a medication issue. Pt wants the her new rx of Wegovy called in to her pharmacy. Pt stated that she has not received the new increased dosage that Dr. Dalbert Garnet had prescribed.Marland KitchenMarland Kitchen

## 2023-09-23 NOTE — Telephone Encounter (Signed)
Returned patient's call and informed her medication had been approved by her insurance company. A PA had to be done in order for the dosage increase to be approved. Patient pick up medication from pharmacy.

## 2023-09-24 ENCOUNTER — Other Ambulatory Visit (HOSPITAL_COMMUNITY): Payer: Self-pay

## 2023-09-25 ENCOUNTER — Other Ambulatory Visit: Payer: Self-pay

## 2023-09-25 ENCOUNTER — Other Ambulatory Visit (HOSPITAL_COMMUNITY): Payer: Self-pay

## 2023-09-29 ENCOUNTER — Ambulatory Visit (HOSPITAL_COMMUNITY)
Admission: RE | Admit: 2023-09-29 | Discharge: 2023-09-29 | Disposition: A | Discharge status: Home / Self Care | Payer: Medicaid Other | Source: Ambulatory Visit | Attending: Family Medicine | Admitting: Family Medicine

## 2023-09-29 ENCOUNTER — Ambulatory Visit (INDEPENDENT_AMBULATORY_CARE_PROVIDER_SITE_OTHER): Payer: Medicaid Other

## 2023-09-29 ENCOUNTER — Ambulatory Visit (INDEPENDENT_AMBULATORY_CARE_PROVIDER_SITE_OTHER): Payer: Medicaid Other | Admitting: Family Medicine

## 2023-09-29 ENCOUNTER — Encounter: Payer: Self-pay | Admitting: Family Medicine

## 2023-09-29 VITALS — BP 137/73 | HR 75 | Ht 62.0 in | Wt 280.2 lb

## 2023-09-29 DIAGNOSIS — Z6841 Body Mass Index (BMI) 40.0 and over, adult: Secondary | ICD-10-CM

## 2023-09-29 DIAGNOSIS — R002 Palpitations: Secondary | ICD-10-CM | POA: Diagnosis not present

## 2023-09-29 DIAGNOSIS — R7303 Prediabetes: Secondary | ICD-10-CM

## 2023-09-29 DIAGNOSIS — J449 Chronic obstructive pulmonary disease, unspecified: Secondary | ICD-10-CM

## 2023-09-29 NOTE — Patient Instructions (Addendum)
It was nice seeing you today. Your EKG showed that your heart beats at a normal rate but with a small delay in the signal transmission. Your palpitations may be due to [facility] medication dose adjustment. Let us give this time. If symptoms persist, send me a MyChart message for a cardiac monitoring scheduling.  For your COPD, please schedule a follow-up with Dr. Raymondo Band for a pulmonary function test.  Palpitations Palpitations are feelings that your heartbeat is not normal. Your heartbeat may feel like it is: Uneven (irregular). Faster than normal. Fluttering. Skipping a beat. This is usually not a serious problem. However, a doctor will do tests and check your medical history to make sure that you do not have a serious heart problem. Follow these instructions at home: Watch for any changes in your condition. Tell your doctor about any changes. Take these actions to help manage your symptoms: Eating and drinking Follow instructions from your doctor about things to eat and drink. You may be told to avoid these things: Drinks that have caffeine in them, such as coffee, tea, soft drinks, and energy drinks. Chocolate. Alcohol. Diet pills. Lifestyle     Try to lower your stress. These things can help you relax: Yoga. Deep breathing and meditation. Guided imagery. This is using words and images to create positive thoughts. Exercise, including swimming, jogging, and walking. Tell your doctor if you have more abnormal heartbeats when you are active. If you have chest pain or feel short of breath with exercise, do not keep doing the exercise until you are seen by your doctor. Biofeedback. This is using your mind to control things in your body, such as your heartbeat. Get plenty of rest and sleep. Keep a regular bed time. Do not use drugs, such as cocaine or ecstasy. Do not use marijuana. Do not smoke or use any products that contain nicotine or tobacco. If you need help quitting, ask your  doctor. General instructions Take over-the-counter and prescription medicines only as told by your doctor. Keep all follow-up visits. You may need more tests if palpitations do not go away or get worse. Contact a doctor if: You keep having fast or uneven heartbeats for a long time. Your symptoms happen more often. Get help right away if: You have chest pain. You feel short of breath. You have a very bad headache. You feel dizzy. You faint. These symptoms may be an emergency. Get help right away. Call your local emergency services (911 in the [place] Do not wait to see if the symptoms will go away. Do not drive yourself to the hospital. Summary Palpitations are feelings that your heartbeat is uneven or faster than normal. It may feel like your heart is fluttering or skipping a beat. Avoid food and drinks that may cause this condition. These include caffeine, chocolate, and alcohol. Try to lower your stress. Do not smoke or use drugs. Get help right away if you faint, feel dizzy, feel short of breath, have chest pain, or have a very bad headache. This information is not intended to replace advice given to you by your health care provider. Make sure you discuss any questions you have with your health care provider. Document Revised: 03/14/2021 Document Reviewed: 03/14/2021 Elsevier Patient Education  2024 [employer].

## 2023-09-29 NOTE — Assessment & Plan Note (Signed)
Doing well on Albterol only Refer to Dr. Raymondo Band for PFT

## 2023-09-29 NOTE — Assessment & Plan Note (Signed)
Doing well on Metformin 500 mg TID and Wegovy 0.5 mg weekly F/U with Weight loss clinic as planned.

## 2023-09-29 NOTE — Progress Notes (Unsigned)
Enrolled for [employer] to mail a ZIO XT long term holter monitor to the patients address on file.   EP to read.

## 2023-09-29 NOTE — Progress Notes (Signed)
    SUBJECTIVE:   CHIEF COMPLAINT / HPI:   Palpitations: C/O heart racing off her chest last week for a few days. She has not experienced this symptom this week. She has been watching a 57-year-old boy who keeps her active. She suspects this might be related to her symptoms. No major change in medications. Although she recently went up on the dose of her Reginal Lutes close to the time she had her symptoms. No chest pain.  COPD: She uses only Albuterol prn.  She self d/ced Elwin Sleight due to intolerance. This caused her palpitations in the past. She denies any SOB, chest pain or chest tightness.  Weight management/PreDM: She is compliant with Metformin and Wegovy. Doing well with diet and exercise as well as f/u with the weight loss clinic.  PERTINENT  PMH / PSH: PMHx reviewed  OBJECTIVE:   BP 137/73   Pulse 75   Ht 5\' 2"  (1.575 m)   Wt 280 lb 3.2 oz (127.1 kg)   SpO2 98%   BMI 51.25 kg/m   Physical Exam Vitals and nursing note reviewed.  Cardiovascular:     Rate and Rhythm: Normal rate and regular rhythm.     Heart sounds: Normal heart sounds. No murmur heard. Pulmonary:     Effort: Pulmonary effort is normal. No respiratory distress.     Breath sounds: Normal breath sounds. No wheezing or rhonchi.      ASSESSMENT/PLAN:   Palpitation Etiology unclear ?? related to [facility] dose change, which may cause tachycardia HR is wnl during this visit, and she has not experienced the symptoms this week EKG showed 1st degree AV-blook; otherwise, reassuring Recent TSH has been normal Keep well hydrated and monitor for now Consider Holter monitoring/Zio patch if this reoccurs. (NB: She prefers to get Zio patch now, test ordered - to be shipped to her home)  COPD, moderate (HCC) Doing well on Albterol only Refer to Dr. Raymondo Band for PFT  BMI 50.0-59.9, adult [facility]) Doing well on Metformin 500 mg TID and Wegovy 0.5 mg weekly F/U with Weight loss clinic as planned.  Pre-diabetes Doing well on  Metformin 500 mg TID and Wegovy 0.5 mg weekly F/U with Weight loss clinic as planned.     Janit Pagan, MD [facility]

## 2023-09-29 NOTE — Assessment & Plan Note (Signed)
Etiology unclear ?? related to [facility] dose change, which may cause tachycardia HR is wnl during this visit, and she has not experienced the symptoms this week EKG showed 1st degree AV-blook; otherwise, reassuring Recent TSH has been normal Keep well hydrated and monitor for now Consider Holter monitoring/Zio patch if this reoccurs. (NB: She prefers to get Zio patch now, test ordered - to be shipped to her home)

## 2023-10-02 DIAGNOSIS — R002 Palpitations: Secondary | ICD-10-CM

## 2023-10-08 ENCOUNTER — Ambulatory Visit: Payer: Medicaid Other | Admitting: Pharmacist

## 2023-10-13 ENCOUNTER — Other Ambulatory Visit (INDEPENDENT_AMBULATORY_CARE_PROVIDER_SITE_OTHER): Payer: Self-pay | Admitting: Family Medicine

## 2023-10-13 DIAGNOSIS — I10 Essential (primary) hypertension: Secondary | ICD-10-CM

## 2023-10-13 MED ORDER — HYDROCHLOROTHIAZIDE 25 MG PO TABS: 25.0000 mg | ORAL_TABLET | Freq: Every day | ORAL | 0 refills | Status: DC

## 2023-10-13 NOTE — Telephone Encounter (Signed)
Pt is calling needing a refill on Rx: hydrochlorothiazide (HYDRODIURIL) 25 MG Pharm: [facility] [address].  Pt stated that she is out of the medication.

## 2023-10-13 NOTE — Telephone Encounter (Signed)
Ok to refill. If so, information is already ready to be sent.

## 2023-10-18 ENCOUNTER — Other Ambulatory Visit: Payer: Self-pay | Admitting: Family Medicine

## 2023-10-18 DIAGNOSIS — I4729 Other ventricular tachycardia: Secondary | ICD-10-CM

## 2023-10-20 ENCOUNTER — Telehealth: Payer: Self-pay | Admitting: Family Medicine

## 2023-10-20 ENCOUNTER — Encounter: Payer: Self-pay | Admitting: Family Medicine

## 2023-10-20 DIAGNOSIS — R9431 Abnormal electrocardiogram [ECG] [EKG]: Secondary | ICD-10-CM

## 2023-10-20 NOTE — Progress Notes (Signed)
Spoke with Carla Hanson about ECHO she is working on it. [name], CMA

## 2023-10-20 NOTE — Telephone Encounter (Signed)
Result discussed.  Few non-sustained irregular heart rhythms. ECHO ordered. She would be contacted to schedule. Lab appointment scheduled for Mag. Recheck Bmet too.

## 2023-10-21 ENCOUNTER — Other Ambulatory Visit (HOSPITAL_COMMUNITY): Payer: Self-pay

## 2023-10-21 ENCOUNTER — Ambulatory Visit (INDEPENDENT_AMBULATORY_CARE_PROVIDER_SITE_OTHER): Payer: Medicaid Other | Admitting: Family Medicine

## 2023-10-21 ENCOUNTER — Encounter (INDEPENDENT_AMBULATORY_CARE_PROVIDER_SITE_OTHER): Payer: Self-pay | Admitting: Family Medicine

## 2023-10-21 ENCOUNTER — Other Ambulatory Visit: Payer: Medicaid Other

## 2023-10-21 VITALS — BP 131/76 | HR 67 | Temp 97.8°F | Ht 62.0 in | Wt 273.0 lb

## 2023-10-21 DIAGNOSIS — R632 Polyphagia: Secondary | ICD-10-CM | POA: Diagnosis not present

## 2023-10-21 DIAGNOSIS — I4729 Other ventricular tachycardia: Secondary | ICD-10-CM | POA: Diagnosis not present

## 2023-10-21 DIAGNOSIS — Z6841 Body Mass Index (BMI) 40.0 and over, adult: Secondary | ICD-10-CM | POA: Diagnosis not present

## 2023-10-21 DIAGNOSIS — F419 Anxiety disorder, unspecified: Secondary | ICD-10-CM | POA: Diagnosis not present

## 2023-10-21 DIAGNOSIS — R7303 Prediabetes: Secondary | ICD-10-CM | POA: Diagnosis not present

## 2023-10-21 DIAGNOSIS — R9431 Abnormal electrocardiogram [ECG] [EKG]: Secondary | ICD-10-CM

## 2023-10-21 DIAGNOSIS — I10 Essential (primary) hypertension: Secondary | ICD-10-CM | POA: Diagnosis not present

## 2023-10-21 MED ORDER — METFORMIN HCL 500 MG PO TABS: ORAL_TABLET | ORAL | 0 refills | Status: DC

## 2023-10-21 MED ORDER — HYDROCHLOROTHIAZIDE 25 MG PO TABS: 25.0000 mg | ORAL_TABLET | Freq: Every day | ORAL | 0 refills | Status: DC

## 2023-10-21 MED ORDER — WEGOVY 0.5 MG/0.5ML ~~LOC~~ SOAJ
0.5000 mg | SUBCUTANEOUS | 0 refills | Status: DC
  Filled 2023-10-21: qty 2, 28d supply, fill #0

## 2023-10-21 MED ORDER — LOSARTAN POTASSIUM 50 MG PO TABS: ORAL_TABLET | ORAL | 0 refills | Status: DC

## 2023-10-21 NOTE — Progress Notes (Signed)
.smr  Office: [phone]  /  Fax: [phone]  WEIGHT SUMMARY AND BIOMETRICS  Anthropometric Measurements Height: 5\' 2"  (1.575 m) Weight: 273 lb (123.8 kg) BMI (Calculated): 49.92 Weight at Last Visit: 274 lb Weight Lost Since Last Visit: 1 lb Weight Gained Since Last Visit: 0 Starting Weight: 324 lb Total Weight Loss (lbs): 49 lb (22.2 kg)   Body Composition  Body Fat %: 56.1 % Fat Mass (lbs): 153.4 lbs Muscle Mass (lbs): 114 lbs Visceral Fat Rating : 21   Other Clinical Data Fasting: No Labs: No Today's Visit #: 19 Starting Date: 01/03/21    Chief Complaint: OBESITY   History of Present Illness   The patient, with a history of prediabetes, hypertension, polyphasia, and obesity, presents for a routine follow-up and medication refills. She is currently on Wegovy for polyphasia, Metformin for prediabetes, and Losartan and Hydrochlorothiazide for hypertension. She reports a weight loss of one pound in the last five weeks and adherence to a category two eating plan 90% of the time. She also engages in an hour of combined cardio and strengthening exercise five times a week.  Recently, the patient has been experiencing heart palpitations and was found to have nonsustained ventricular tachycardia on a Zio monitor. She reports waking up in the middle of the night with a rapid heart rate and is currently awaiting an echocardiogram. She denies any other symptoms but expresses concern about the palpitations and the potential need for additional medication.  The patient also discusses feelings of being overwhelmed and anxious, which she attributes to her health journey and the effort to maintain her health regimen. She expresses a desire to reduce her medication load, particularly her blood pressure medication, with further weight loss.  Despite these challenges, the patient has made significant strides in her exercise regimen, enjoying workouts and noticing a positive impact on her  energy levels and stress. She is a member of a gym and engages in both cardio and strengthening exercises. She is also trying to incorporate more walking into her routine, despite some difficulties with varying terrain and lung capacity. She expresses a desire to switch to a lower carb eating plan for increased energy and to detox from recent indulgences.          PHYSICAL EXAM:  Blood pressure 131/76, pulse 67, temperature 97.8 F (36.6 C), height 5\' 2"  (1.575 m), weight 273 lb (123.8 kg), SpO2 98%. Body mass index is 49.93 kg/m.  DIAGNOSTIC DATA REVIEWED:  BMET    Component Value Date/Time   NA 141 07/29/2023 0748   K 4.0 07/29/2023 0748   CL 102 07/29/2023 0748   CO2 25 07/29/2023 0748   GLUCOSE 93 07/29/2023 0748   GLUCOSE 89 11/10/2016 0230   BUN 15 07/29/2023 0748   CREATININE 0.63 07/29/2023 0748   CREATININE 0.57 11/09/2014 1032   CALCIUM 9.7 07/29/2023 0748   GFRNONAA 110 12/08/2019 1210   GFRNONAA >89 12/29/2013 0942   GFRAA 127 12/08/2019 1210   GFRAA >89 12/29/2013 0942   Lab Results  Component Value Date   HGBA1C 6.2 (H) 07/29/2023   HGBA1C 6.0 12/31/2012   Lab Results  Component Value Date   INSULIN 36.7 (H) 07/29/2023   INSULIN 77.3 (H) 01/03/2021   Lab Results  Component Value Date   TSH 1.630 07/22/2022   CBC    Component Value Date/Time   WBC 7.1 07/22/2022 0749   WBC 12.0 (H) 11/10/2016 0230   RBC 4.95 07/22/2022 0749   RBC 4.56  11/10/2016 0230   HGB 12.7 07/22/2022 0749   HCT 39.9 07/22/2022 0749   PLT 314 07/22/2022 0749   MCV 81 07/22/2022 0749   MCH 25.7 (L) 07/22/2022 0749   MCH 22.6 (L) 11/10/2016 0230   MCHC 31.8 07/22/2022 0749   MCHC 31.2 11/10/2016 0230   RDW 14.4 07/22/2022 0749   Iron Studies    Component Value Date/Time   IRON 32 12/03/2016 1442   TIBC 363 12/03/2016 1442   FERRITIN 13 (L) 02/09/2018 1501   IRONPCTSAT 9 (LL) 12/03/2016 1442   Lipid Panel     Component Value Date/Time   CHOL 138 03/25/2023 0748    TRIG 121 03/25/2023 0748   HDL 44 03/25/2023 0748   CHOLHDL 5.7 (H) 05/15/2021 0929   CHOLHDL 6.3 (H) 01/08/2017 0947   VLDL 40 (H) 01/08/2017 0947   LDLCALC 72 03/25/2023 0748   Hepatic Function Panel     Component Value Date/Time   PROT 7.7 07/29/2023 0748   ALBUMIN 4.0 07/29/2023 0748   AST 15 07/29/2023 0748   ALT 16 07/29/2023 0748   ALKPHOS 52 07/29/2023 0748   BILITOT <0.2 07/29/2023 0748      Component Value Date/Time   TSH 1.630 07/22/2022 0749   Nutritional Lab Results  Component Value Date   VD25OH 70.4 07/29/2023   VD25OH 63.8 03/25/2023   VD25OH 56.8 12/18/2022     Assessment and Plan    Non sustained Ventricular Tachycardia (NSVT) Reports heart palpitations, diagnosed with NSVT following a Zio patch study. Potential etiologies include thyroid dysfunction, sleep apnea, or anxiety. Echocardiogram pending to assess structural heart changes. Discussed that NSVT involves rapid heartbeats from the ventricles, not indicative of early heart attacks but requires management to prevent complications. -  echocardiogram ordered, follow with cardiology as advised - Discuss potential causes and treatments based on echocardiogram results  Anxiety Experiencing anxiety, potentially contributing to palpitations and fatigue. Discussed the impact of anxiety on physical symptoms and benefits of counseling. - Offer referral to a counselor for anxiety management - Discuss potential impact of anxiety on physical symptoms  Hypertension On losartan and hydrochlorothiazide. Blood pressure 131/76 mmHg, indicating good control. Discussed potential for medication reduction with weight loss, emphasizing health maintenance. - Refill losartan and hydrochlorothiazide prescriptions - Monitor blood pressure with potential for medication reduction as weight loss continues  Prediabetes On metformin, following a category two eating plan and regular exercise, resulting in one-pound weight  loss over five weeks. Discussed metformin benefits, including anti-aging properties and inflammation reduction. - Refill metformin prescription - Encourage continued adherence to diet and exercise regimen  Obesity On Wegovy, lost one pound in five weeks. Exercising regularly and following a healthy eating plan. Discussed transitioning to a lower carb diet for increased energy and further weight loss, noting initial carb withdrawal symptoms. - Refill Wegovy prescription - Encourage continued exercise and healthy eating - Support transition to a lower carb diet for increased energy and further weight loss  General Health Maintenance Maintaining a healthy lifestyle with regular exercise and balanced diet. Considering a lower carb diet for additional benefits. Emphasized hydration importance during diet transition. - Encourage hydration, especially with the transition to a low carb diet - Schedule follow-up appointment for November 18, 2023 - Schedule February appointment.         I have personally spent 40 minutes total time today in preparation, patient care, and documentation for this visit, including the following: review of clinical lab tests; review of medical tests/procedures/services.  She was informed of the importance of frequent follow up visits to maximize her success with intensive lifestyle modifications for her multiple health conditions.    Quillian Quince, MD

## 2023-10-22 ENCOUNTER — Encounter: Payer: Self-pay | Admitting: Family Medicine

## 2023-10-22 LAB — BASIC METABOLIC PANEL
BUN/Creatinine Ratio: 20 (ref 9–23)
BUN: 12 mg/dL (ref 6–24)
CO2: 25 mmol/L (ref 20–29)
Calcium: 10.2 mg/dL (ref 8.7–10.2)
Chloride: 100 mmol/L (ref 96–106)
Creatinine, Ser: 0.61 mg/dL (ref 0.57–1.00)
Glucose: 90 mg/dL (ref 70–99)
Potassium: 4.4 mmol/L (ref 3.5–5.2)
Sodium: 140 mmol/L (ref 134–144)
eGFR: 105 mL/min/{1.73_m2} (ref >59)

## 2023-10-22 LAB — MAGNESIUM: Magnesium: 1.8 mg/dL (ref 1.6–2.3)

## 2023-11-09 DIAGNOSIS — S29012A Strain of muscle and tendon of back wall of thorax, initial encounter: Secondary | ICD-10-CM | POA: Diagnosis not present

## 2023-11-13 DIAGNOSIS — R0781 Pleurodynia: Secondary | ICD-10-CM | POA: Diagnosis not present

## 2023-11-14 ENCOUNTER — Ambulatory Visit: Payer: Medicaid Other | Admitting: Family Medicine

## 2023-11-17 ENCOUNTER — Emergency Department (HOSPITAL_COMMUNITY)
Admission: EM | Admit: 2023-11-17 | Discharge: 2023-11-17 | Disposition: A | Discharge status: Home / Self Care | Payer: Medicaid Other | Attending: Emergency Medicine | Admitting: Emergency Medicine

## 2023-11-17 ENCOUNTER — Emergency Department (HOSPITAL_COMMUNITY): Payer: Medicaid Other

## 2023-11-17 ENCOUNTER — Other Ambulatory Visit: Payer: Self-pay

## 2023-11-17 ENCOUNTER — Encounter (HOSPITAL_COMMUNITY): Payer: Self-pay

## 2023-11-17 DIAGNOSIS — J449 Chronic obstructive pulmonary disease, unspecified: Secondary | ICD-10-CM | POA: Diagnosis not present

## 2023-11-17 DIAGNOSIS — M62830 Muscle spasm of back: Secondary | ICD-10-CM | POA: Diagnosis not present

## 2023-11-17 DIAGNOSIS — I10 Essential (primary) hypertension: Secondary | ICD-10-CM | POA: Insufficient documentation

## 2023-11-17 DIAGNOSIS — E669 Obesity, unspecified: Secondary | ICD-10-CM | POA: Diagnosis not present

## 2023-11-17 DIAGNOSIS — M546 Pain in thoracic spine: Secondary | ICD-10-CM | POA: Diagnosis not present

## 2023-11-17 DIAGNOSIS — Z7982 Long term (current) use of aspirin: Secondary | ICD-10-CM | POA: Diagnosis not present

## 2023-11-17 DIAGNOSIS — Z79899 Other long term (current) drug therapy: Secondary | ICD-10-CM | POA: Insufficient documentation

## 2023-11-17 DIAGNOSIS — Z6841 Body Mass Index (BMI) 40.0 and over, adult: Secondary | ICD-10-CM | POA: Insufficient documentation

## 2023-11-17 DIAGNOSIS — M545 Low back pain, unspecified: Secondary | ICD-10-CM | POA: Diagnosis present

## 2023-11-17 DIAGNOSIS — M549 Dorsalgia, unspecified: Secondary | ICD-10-CM | POA: Diagnosis not present

## 2023-11-17 LAB — TROPONIN I (HIGH SENSITIVITY): Troponin I (High Sensitivity): 3 ng/L (ref <18)

## 2023-11-17 LAB — D-DIMER, QUANTITATIVE: D-Dimer, Quant: 0.64 ug{FEU}/mL — ABNORMAL HIGH (ref 0.00–0.50)

## 2023-11-17 LAB — CBC WITH DIFFERENTIAL/PLATELET
Abs Immature Granulocytes: 0.02 10*3/uL (ref 0.00–0.07)
Basophils Absolute: 0.1 10*3/uL (ref 0.0–0.1)
Basophils Relative: 0 %
Eosinophils Absolute: 0.2 10*3/uL (ref 0.0–0.5)
Eosinophils Relative: 1 %
HCT: 39.9 % (ref 36.0–46.0)
Hemoglobin: 13.1 g/dL (ref 12.0–15.0)
Immature Granulocytes: 0 %
Lymphocytes Relative: 20 %
Lymphs Abs: 2.4 10*3/uL (ref 0.7–4.0)
MCH: 27.9 pg (ref 26.0–34.0)
MCHC: 32.8 g/dL (ref 30.0–36.0)
MCV: 85.1 fL (ref 80.0–100.0)
Monocytes Absolute: 0.6 10*3/uL (ref 0.1–1.0)
Monocytes Relative: 5 %
Neutro Abs: 8.4 10*3/uL — ABNORMAL HIGH (ref 1.7–7.7)
Neutrophils Relative %: 74 %
Platelets: 305 10*3/uL (ref 150–400)
RBC: 4.69 MIL/uL (ref 3.87–5.11)
RDW: 14.5 % (ref 11.5–15.5)
WBC: 11.6 10*3/uL — ABNORMAL HIGH (ref 4.0–10.5)
nRBC: 0 % (ref 0.0–0.2)

## 2023-11-17 LAB — COMPREHENSIVE METABOLIC PANEL
ALT: 14 U/L (ref 0–44)
AST: 14 U/L — ABNORMAL LOW (ref 15–41)
Albumin: 4 g/dL (ref 3.5–5.0)
Alkaline Phosphatase: 45 U/L (ref 38–126)
Anion gap: 10 (ref 5–15)
BUN: 17 mg/dL (ref 6–20)
CO2: 27 mmol/L (ref 22–32)
Calcium: 10.1 mg/dL (ref 8.9–10.3)
Chloride: 102 mmol/L (ref 98–111)
Creatinine, Ser: 0.57 mg/dL (ref 0.44–1.00)
GFR, Estimated: >60 mL/min (ref >60)
Glucose, Bld: 107 mg/dL — ABNORMAL HIGH (ref 70–99)
Potassium: 3.5 mmol/L (ref 3.5–5.1)
Sodium: 139 mmol/L (ref 135–145)
Total Bilirubin: 0.5 mg/dL (ref 0.0–1.2)
Total Protein: 9.9 g/dL — ABNORMAL HIGH (ref 6.5–8.1)

## 2023-11-17 MED ORDER — KETOROLAC TROMETHAMINE 15 MG/ML IJ SOLN
15.0000 mg | Freq: Once | INTRAMUSCULAR | Status: AC
  Administered 2023-11-17: 15 mg via INTRAVENOUS
  Filled 2023-11-17: qty 1

## 2023-11-17 MED ORDER — LIDOCAINE 5 % EX PTCH
1.0000 | MEDICATED_PATCH | Freq: Once | CUTANEOUS | Status: DC
  Administered 2023-11-17: 1 via TRANSDERMAL
  Filled 2023-11-17: qty 1

## 2023-11-17 MED ORDER — CYCLOBENZAPRINE HCL 10 MG PO TABS: 10.0000 mg | ORAL_TABLET | Freq: Two times a day (BID) | ORAL | 0 refills | Status: DC | PRN

## 2023-11-17 MED ORDER — CYCLOBENZAPRINE HCL 10 MG PO TABS
10.0000 mg | ORAL_TABLET | Freq: Once | ORAL | Status: AC
  Administered 2023-11-17: 10 mg via ORAL
  Filled 2023-11-17: qty 1

## 2023-11-17 NOTE — ED Triage Notes (Signed)
 Patient reports mid to upper back pain x 1.5 weeks. Was seen at Urgent care for same and prescribed a muscle relaxer. Patient has taken muscle relaxer, tylenol, and lidocaine patches without relief.
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
## 2023-11-17 NOTE — ED Notes (Signed)
 Rolled pt to bathroom in wheelchair. Family assisting pt in the bathroom.

## 2023-11-17 NOTE — Discharge Instructions (Signed)
 You were seen in the emergency department for your back pain.  Your workup showed no abnormalities within your lungs, no signs of blood clots or stress on your heart.  This is likely due to a muscle type of pain.  You can keep taking Tylenol  every 6 hours as needed and you should add in ibuprofen every 6 hours as needed as well.  He should feel better now rather than ice.  You can continue to do lidocaine  patches.  You can take Flexeril  as needed instead of the Robaxin .  You should follow-up with your primary doctor to have your symptoms rechecked and may benefit from physical therapy at this point.  You should return to the emergency department if you develop numbness or weakness in your arms, have severe shortness of breath or any other new or concerning symptoms.

## 2023-11-18 ENCOUNTER — Encounter (INDEPENDENT_AMBULATORY_CARE_PROVIDER_SITE_OTHER): Payer: Self-pay

## 2023-11-18 ENCOUNTER — Ambulatory Visit (INDEPENDENT_AMBULATORY_CARE_PROVIDER_SITE_OTHER): Payer: Medicaid Other | Admitting: Family Medicine

## 2023-11-19 ENCOUNTER — Ambulatory Visit (HOSPITAL_COMMUNITY): Payer: Medicaid Other

## 2023-11-22 ENCOUNTER — Emergency Department (HOSPITAL_COMMUNITY)
Admission: EM | Admit: 2023-11-22 | Discharge: 2023-11-22 | Disposition: A | Discharge status: Home / Self Care | Payer: Medicaid Other | Attending: Emergency Medicine | Admitting: Emergency Medicine

## 2023-11-22 ENCOUNTER — Other Ambulatory Visit: Payer: Self-pay

## 2023-11-22 ENCOUNTER — Encounter (HOSPITAL_COMMUNITY): Payer: Self-pay | Admitting: Emergency Medicine

## 2023-11-22 DIAGNOSIS — Z7982 Long term (current) use of aspirin: Secondary | ICD-10-CM | POA: Diagnosis not present

## 2023-11-22 DIAGNOSIS — M549 Dorsalgia, unspecified: Secondary | ICD-10-CM | POA: Diagnosis present

## 2023-11-22 DIAGNOSIS — S39012A Strain of muscle, fascia and tendon of lower back, initial encounter: Secondary | ICD-10-CM | POA: Diagnosis not present

## 2023-11-22 DIAGNOSIS — X501XXA Overexertion from prolonged static or awkward postures, initial encounter: Secondary | ICD-10-CM | POA: Insufficient documentation

## 2023-11-22 DIAGNOSIS — I10 Essential (primary) hypertension: Secondary | ICD-10-CM | POA: Diagnosis not present

## 2023-11-22 DIAGNOSIS — J449 Chronic obstructive pulmonary disease, unspecified: Secondary | ICD-10-CM | POA: Diagnosis not present

## 2023-11-22 MED ORDER — OXYCODONE HCL 5 MG PO TABS
5.0000 mg | ORAL_TABLET | Freq: Once | ORAL | Status: AC
  Administered 2023-11-22: 5 mg via ORAL
  Filled 2023-11-22: qty 1

## 2023-11-22 NOTE — ED Triage Notes (Signed)
Pt reports upper back pain x 2 weeks. Reports she was seen for same 4 days ago & was given muscle relaxers, lidocaine patches and tylenol w/ no relief. Images - when last seen.

## 2023-11-22 NOTE — Discharge Instructions (Signed)
 We saw you in the ER for back pain. Fortunately, our evaluation is not concerning for emergent pathology such as spinal cord compression or infection.   Often the pain is self limiting, you just need time and supportive medications such as tylenol every 6-8 hours for the next few days. Take the muscle relaxant as needed (however do not operate machinery or drive after using this medication due to its sedating side effects), use the lidocaine patches, and see your primary care doctor for further management if the symptoms continue to linger.   Please use the back exercises to strengthen the back muscles and be very careful with activities in future to prevent similar painful events.  Please return to the ER if your pain becomes excruciating or you start developing new numbness, weakness, urinary incontinence (peeing on self without warning), urinary retention (not able to pee despite bladder feeling full), inability to defecate, pins and needles sensation by your ano-genital area.

## 2023-11-22 NOTE — ED Provider Notes (Signed)
[name] EMERGENCY DEPARTMENT AT [facility] Provider Note   CSN: [id number] Arrival date & time: 11/22/23  0325     History  Chief Complaint  Patient presents with   Back Pain    Carla Hanson is a 58 y.o. female past medical history of hypertension, obesity, COPD, prior provoked PE/DVT presenting to the emergency department for back pain.  Back pain has been present for the past few weeks after patient went to the gym and overexerted herself.  Patient think she may have strained her muscle.  Patient was seen previously and diagnosed with a muscle strain has been taking the Flexeril however states has not been helping with Tylenol.  Patient denies any chest pain shortness of breath urinary symptoms constipation diarrhea fevers paresthesias new onset weakness.  Patient is able to ambulate at her baseline with a cane.  Patient denies urinary/bowel incontinence, saddle anesthesia.  Patient states she is only here for pain meds and does not want any labs or imaging.  Home Medications Prior to Admission medications   Medication Sig Start Date End Date Taking? Authorizing Provider  acetaminophen (TYLENOL) 325 MG tablet Take 650 mg by mouth every 6 (six) hours as needed for mild pain. Patient not taking: Reported on 10/21/2023    [provider]  albuterol (PROVENTIL) (2.5 MG/3ML) 0.083% nebulizer solution Take 3 mLs (2.5 mg total) by nebulization every 6 (six) hours as needed for wheezing or shortness of breath. Patient not taking: Reported on 10/21/2023 05/19/23   Doreene Eland, MD  albuterol (VENTOLIN HFA) 108 (90 Base) MCG/ACT inhaler Inhale 2 puffs into the lungs every 6 (six) hours as needed for wheezing or shortness of breath. Patient not taking: Reported on 10/21/2023 05/19/23   Doreene Eland, MD  aspirin EC 81 MG EC tablet Take 1 tablet (81 mg total) by mouth daily. 11/14/16   Layne Benton, NP  cetirizine (ZYRTEC) 10 MG tablet TAKE 1 TABLET(10 MG) BY MOUTH  DAILY 04/24/23   Doreene Eland, MD  cyclobenzaprine (FLEXERIL) 10 MG tablet Take 1 tablet (10 mg total) by mouth 2 (two) times daily as needed for muscle spasms. 11/17/23   Elayne Snare K, DO  ferrous sulfate 325 (65 FE) MG tablet Take 325 mg by mouth daily with breakfast.    [provider]  hydrochlorothiazide (HYDRODIURIL) 25 MG tablet Take 1 tablet (25 mg total) by mouth daily. 10/21/23   Quillian Quince D, MD  losartan (COZAAR) 50 MG tablet TAKE 1 TABLET(50 MG) BY MOUTH DAILY 10/21/23   Quillian Quince D, MD  metFORMIN (GLUCOPHAGE) 500 MG tablet TAKE 1 TABLET(500 MG) BY MOUTH THREE TIMES DAILY WITH MEALS 10/21/23   Dalbert Garnet, Caren D, MD  rosuvastatin (CRESTOR) 20 MG tablet TAKE 1 TABLET(20 MG) BY MOUTH DAILY 06/30/23   Doreene Eland, MD  Semaglutide-Weight Management (WEGOVY) 0.5 MG/0.5ML SOAJ Inject 0.5 mg into the skin once a week. 10/21/23   Quillian Quince D, MD  Vitamin D, Ergocalciferol, (DRISDOL) 1.25 MG (50000 UNIT) CAPS capsule Over-the-counter.  Take 1 capsule every 14 days. 10/02/22   Whitmire, [name], FNP      Allergies    Latuda [lurasidone]    Review of Systems   Review of Systems  Musculoskeletal:  Positive for back pain.    Physical Exam Updated Vital Signs BP 139/85 (BP Location: Right Arm)   Pulse 92   Temp 98.1 F (36.7 C) (Oral)   Resp 18   Ht 5\' 2"  (1.575  m)   Wt 123.8 kg   SpO2 98%   BMI 49.93 kg/m  Physical Exam Constitutional:      General: She is not in acute distress. Cardiovascular:     Rate and Rhythm: Normal rate.     Pulses: Normal pulses.  Musculoskeletal:     Comments: Point tenderness to parathoracic musculature with no midline tenderness or abnormalities 5 out of 5 bilateral hip flexion No midline tenderness or abnormalities palpated  Skin:    General: Skin is warm and dry.     Capillary Refill: Capillary refill takes less than 2 seconds.  Neurological:     Mental Status: She is alert.     Comments: Sensation intact  distally 2+ bilateral patellar reflexes Able to ambulate without difficulty does endorse pain when ambulating with cane which is her baseline  Psychiatric:        Mood and Affect: Mood normal.     ED Results / Procedures / Treatments   Labs (all labs ordered are listed, but only abnormal results are displayed) Labs Reviewed - No data to display  EKG None  Radiology No results found.  Procedures Procedures    Medications Ordered in ED Medications  oxyCODONE (Oxy IR/ROXICODONE) immediate release tablet 5 mg (5 mg Oral Given 11/22/23 1914)    ED Course/ Medical Decision Making/ A&P                                 Medical Decision Making Risk Prescription drug management.   Carla Hanson 58 y.o. presented today for back pain. Working DDx that I considered at this time includes, but not limited to, MSK, underlying fracture, epidural hematoma/abscess, cauda equina syndrome, spinal stenosis, spinal malignancy, discitis, spinal infection, spondylitises/ spondylosis, conus medullaris, DDD of the back.  R/o DDx: underlying fracture, epidural hematoma/abscess, cauda equina syndrome, spinal stenosis, spinal malignancy, discitis, spinal infection, spondylitises/ spondylosis, conus medullaris, DDD of the back. : less likely due to history of present illness, physical exam, labs/imaging findings.  Review of prior external notes: 11/17/2023 ED provider  Unique Tests and My Interpretation: None  Social Determinants of Health: none  Discussion with Independent Historian:  Family member  Discussion of Management of Tests: None  Risk: Medium: prescription drug management  Risk Stratification Score: None  Plan: On exam patient was no acute distress with stable vitals. No neurological deficits and normal neuro exam.  Patient can walk but states is painful.  No loss of bowel or bladder control.  No concern for cauda equina.  No fever, night sweats, weight loss, h/o cancer, IVDU.   RICE protocol and pain medicine indicated and discussed with patient.  Patient already has lidocaine patches prescribed to her along with Flexeril and states she would like to continue these but is only here for 1 dose of pain meds.  Upon chart review it appears patient did have mildly elevated D-dimer last time I spoke to the patient about getting labs and imaging to further evaluate as patient does have history of PE however patient denies any shortness of breath chest pain recent travel/hospitalization or surgery, hemoptysis, leg swelling states this is not a blood clot and does not want to have any labs or imaging done as it is a muscle strain.  Patient does understand that this may lead to missed diagnoses and complications and with full decision-making capacity verbalized understanding acceptance of this.  This conversation was witnessed by  her family member in the room.  Patient does have point tenderness on exam to the parathoracic musculature is have high suspicion this is MSK in nature given history of exercises along previous visit and so we will give 1 dose of pain meds and follow-up with her primary care provider.  I offered a steroid shot as well however patient declined as she just wants to have the pain meds.  Patient states she has been using heat for her back and I educated her on using ice instead of sleeping disorders.  Patient verbalized understanding acceptance of this.  Patient was given return precautions. Patient stable for discharge at this time.  Patient verbalized understanding of plan.  This chart was dictated using voice recognition software.  Despite best efforts to proofread,  errors can occur which can change the documentation meaning.        Final Clinical Impression(s) / ED Diagnoses Final diagnoses:  Back strain, initial encounter    Rx / DC Orders ED Discharge Orders     None         Remi Deter 11/22/23 0715    Loetta Rough,  MD 11/22/23 [phone]

## 2023-11-24 ENCOUNTER — Telehealth (INDEPENDENT_AMBULATORY_CARE_PROVIDER_SITE_OTHER): Payer: Self-pay | Admitting: Family Medicine

## 2023-11-24 NOTE — Telephone Encounter (Signed)
Pt called in asking for a refill for [facility] and Metformin. Please follow up with pt.

## 2023-11-25 ENCOUNTER — Encounter (INDEPENDENT_AMBULATORY_CARE_PROVIDER_SITE_OTHER): Payer: Self-pay | Admitting: Family Medicine

## 2023-11-25 ENCOUNTER — Other Ambulatory Visit (INDEPENDENT_AMBULATORY_CARE_PROVIDER_SITE_OTHER): Payer: Self-pay | Admitting: Family Medicine

## 2023-11-25 ENCOUNTER — Other Ambulatory Visit (HOSPITAL_COMMUNITY): Payer: Self-pay

## 2023-11-25 DIAGNOSIS — R7303 Prediabetes: Secondary | ICD-10-CM

## 2023-11-25 DIAGNOSIS — R632 Polyphagia: Secondary | ICD-10-CM

## 2023-11-25 NOTE — Telephone Encounter (Signed)
Pt reports she has been out of her medicine (Wegovy)since Friday 11/21/23 and she also needs Metformin. Please follow up with pt.

## 2023-11-26 ENCOUNTER — Telehealth (INDEPENDENT_AMBULATORY_CARE_PROVIDER_SITE_OTHER): Payer: Self-pay | Admitting: Family Medicine

## 2023-11-26 MED ORDER — WEGOVY 0.5 MG/0.5ML ~~LOC~~ SOAJ: 0.5000 mg | SUBCUTANEOUS | 0 refills | Status: DC

## 2023-11-26 MED ORDER — METFORMIN HCL 500 MG PO TABS: ORAL_TABLET | ORAL | 0 refills | Status: DC

## 2023-11-26 NOTE — Telephone Encounter (Signed)
LAST APPOINTMENT DATE: 10/21/2023 NEXT APPOINTMENT DATE: 12/25/2023   [facility] #[id number] [place], [place] - [address] AT [facility] OF [facility] [address] [place] [address] Phone: [phone] Fax: [phone]  [facility] #[id number] [facility] [place], [place] - [address] AT [facility] OF [address] [place] [address] Phone: [phone] Fax: [phone]  [facility] [address] [place] [address] Phone: [phone] Fax: [phone]  Patient is requesting a refill of the following medications: Requested Prescriptions   Pending Prescriptions Disp Refills   Semaglutide-Weight Management (WEGOVY) 0.5 MG/0.5ML SOAJ 2 mL 0    Sig: Inject 0.5 mg into the skin once a week.   metFORMIN (GLUCOPHAGE) 500 MG tablet 270 tablet 0    Sig: TAKE 1 TABLET(500 MG) BY MOUTH THREE TIMES DAILY WITH MEALS    Date last filled: 10/21/2023 Previously prescribed by [facility]  Lab Results  Component Value Date   HGBA1C 6.2 (H) 07/29/2023   HGBA1C 6.3 (H) 03/25/2023   HGBA1C 6.1 (H) 12/18/2022   Lab Results  Component Value Date   LDLCALC 72 03/25/2023   CREATININE 0.57 11/17/2023   Lab Results  Component Value Date   VD25OH 70.4 07/29/2023   VD25OH 63.8 03/25/2023   VD25OH 56.8 12/18/2022    BP Readings from Last 3 Encounters:  11/22/23 139/85  11/17/23 (!) 142/78  10/21/23 131/76  ./

## 2023-11-26 NOTE — Telephone Encounter (Signed)
Patiet called in stating she needs a refill on her Wegovy and Metformin. Please send to [facility]. States she has not had her Wegovy since Friday.11/17/23

## 2023-11-28 ENCOUNTER — Encounter: Payer: Self-pay | Admitting: Family Medicine

## 2023-11-28 ENCOUNTER — Ambulatory Visit (INDEPENDENT_AMBULATORY_CARE_PROVIDER_SITE_OTHER): Payer: Medicaid Other | Admitting: Family Medicine

## 2023-11-28 ENCOUNTER — Ambulatory Visit (HOSPITAL_COMMUNITY)
Admission: RE | Admit: 2023-11-28 | Discharge: 2023-11-28 | Disposition: A | Discharge status: Home / Self Care | Payer: Medicaid Other | Source: Ambulatory Visit | Attending: Family Medicine | Admitting: Family Medicine

## 2023-11-28 VITALS — BP 149/85 | HR 87 | Ht 62.0 in | Wt 276.2 lb

## 2023-11-28 DIAGNOSIS — M47814 Spondylosis without myelopathy or radiculopathy, thoracic region: Secondary | ICD-10-CM | POA: Diagnosis not present

## 2023-11-28 DIAGNOSIS — I10 Essential (primary) hypertension: Secondary | ICD-10-CM | POA: Diagnosis not present

## 2023-11-28 DIAGNOSIS — M546 Pain in thoracic spine: Secondary | ICD-10-CM | POA: Diagnosis not present

## 2023-11-28 DIAGNOSIS — M549 Dorsalgia, unspecified: Secondary | ICD-10-CM | POA: Insufficient documentation

## 2023-11-28 DIAGNOSIS — M4854XA Collapsed vertebra, not elsewhere classified, thoracic region, initial encounter for fracture: Secondary | ICD-10-CM | POA: Diagnosis not present

## 2023-11-28 MED ORDER — OXYCODONE HCL 5 MG PO CAPS: 5.0000 mg | ORAL_CAPSULE | Freq: Three times a day (TID) | ORAL | 0 refills | Status: AC | PRN

## 2023-11-28 MED ORDER — TIZANIDINE HCL 4 MG PO TABS: 4.0000 mg | ORAL_TABLET | Freq: Three times a day (TID) | ORAL | 0 refills | Status: DC | PRN

## 2023-11-28 NOTE — Assessment & Plan Note (Addendum)
Back pain is likely due to muscle strain following exercise No skin lesion, hence, I am less concerned about Shingles She had been to the ED twice for these symptoms I reviewed ED notes and imaging - chest xray benign I recommended a dedicated thoracic spine x-ray, which was ordered She will go to the radiology department for her X-ray Switch Flexeril back to Tizanadine, which helped better in the past I advised her not to combine muscle relaxants Continue Tylenol prn pain Oxycodone escribed for breakthrough pain F/U soon if symptoms worsen She agreed with the plan

## 2023-11-28 NOTE — Assessment & Plan Note (Signed)
BP was elevated initially with improvement after recheck No changes were made to her medications today

## 2023-11-28 NOTE — Progress Notes (Signed)
SUBJECTIVE:   CHIEF COMPLAINT / HPI:   Back Pain This is a new problem. The current episode started in the past 7 days (Mid back pain started 3 weeks ago). The problem occurs intermittently. The problem has been waxing and waning (Pain is worse at night) since onset. The pain is present in the thoracic spine. The quality of the pain is described as burning (Sharp pain.). Radiates to: Radiates to her left side. The pain is at a severity of 10/10. The pain is Worse during the night. The symptoms are aggravated by twisting and position. Pertinent negatives include no abdominal pain, bladder incontinence, bowel incontinence, chest pain, fever, headaches, leg pain, numbness, paresthesias or tingling. (No SOB Sometimes with the back pain, she will have nerve pain also shooting to the left side of her breast. This resolves completely once her back pain subsides.  No rash or lesion on her back or chest. ) Risk factors: Pain started after she did a chest exercise 3 weeks earlier. Since then her pain had worsened. Treatments tried: Tylenol and Flexeril, Robaxin - she is out of her muscle relaxant. She tried Tizanidine which helped better. Lidoderm 4% helps some.   HTN: She is compliant with her hydrochlorothiazide 25 mg at bedtime and Losartan 50 mg every day. She does not check her BP regularly at home. She felt elevated BP might be due to her back pain.    PERTINENT  PMH / PSH: PMHx reviewed.  OBJECTIVE:   Vitals:   11/28/23 0945 11/28/23 1009  BP: (!) 177/92 (!) 149/85  Pulse: 87   SpO2: 100%   Weight: 276 lb 4 oz (125.3 kg)   Height: 5\' 2"  (1.575 m)     Physical Exam Vitals and nursing note reviewed.  Constitutional:      Appearance: She is obese. She is not toxic-appearing.  Cardiovascular:     Rate and Rhythm: Normal rate and regular rhythm.     Heart sounds: Normal heart sounds. No murmur heard. Pulmonary:     Effort: Pulmonary effort is normal. No respiratory distress.      Breath sounds: Normal breath sounds. No wheezing.  Musculoskeletal:     Cervical back: Normal.     Thoracic back: No swelling, edema, deformity, signs of trauma, tenderness or bony tenderness.     Lumbar back: Normal.     Comments: Moderate thoracic spine tenderness with paraspinal muscle tenderness and tightness  Skin:    Comments: No rash on her back or her left side  Neurological:     Mental Status: She is alert.      ASSESSMENT/PLAN:   Back pain Back pain is likely due to muscle strain following exercise No skin lesion, hence, I am less concerned about Shingles She had been to the ED twice for these symptoms I reviewed ED notes and imaging - chest xray benign I recommended a dedicated thoracic spine x-ray, which was ordered She will go to the radiology department for her X-ray Switch Flexeril back to Tizanadine, which helped better in the past I advised her not to combine muscle relaxants Continue Tylenol prn pain Oxycodone escribed for breakthrough pain F/U soon if symptoms worsen She agreed with the plan  Essential hypertension BP was elevated initially with improvement after recheck No changes were made to her medications today   I discussed need for pneumonia vaccination given her hx of COPD. She will return for this when she feels better and obtain Shingrix at her pharmacy.  Janit Pagan, MD [facility]

## 2023-11-28 NOTE — Patient Instructions (Signed)
Nice seeing you today. I am sorry about your back pain. I switched your muscle relaxant to Tizanidine. Avoid using it with other muscle relaxants. Please get your back xray as discussed. Use Oxycodone only for a short period for breakthrough pain. Stay well.

## 2023-12-02 ENCOUNTER — Ambulatory Visit (HOSPITAL_COMMUNITY): Payer: Medicaid Other

## 2023-12-02 ENCOUNTER — Encounter (HOSPITAL_COMMUNITY): Payer: Self-pay

## 2023-12-04 ENCOUNTER — Encounter: Payer: Self-pay | Admitting: Family Medicine

## 2023-12-04 ENCOUNTER — Telehealth: Payer: Self-pay | Admitting: Family Medicine

## 2023-12-04 DIAGNOSIS — S22050S Wedge compression fracture of T5-T6 vertebra, sequela: Secondary | ICD-10-CM

## 2023-12-04 MED ORDER — MUELLER ADJUSTABLE BACK BRACE MISC: 0 refills | Status: DC

## 2023-12-04 MED ORDER — CALCITONIN (SALMON) 200 UNIT/ACT NA SOLN: 1.0000 | Freq: Every day | NASAL | 0 refills | Status: DC

## 2023-12-04 MED ORDER — LIDOCAINE 5 % EX PTCH: 1.0000 | MEDICATED_PATCH | CUTANEOUS | 1 refills | Status: DC

## 2023-12-04 NOTE — Telephone Encounter (Addendum)
I have discussed the x-ray spine with the patient - she has a compression fracture. Management plan discussed. Continue pain meds and start a back brace She is interested in PT eval for this as well Due to no malalignment and no surgical management at this time - we will consider referral if there is no improvement. She verbalized understanding Trial nasal calcitonin was discussed.  Start OTC calcium and Vitamin D supplements Lidoderm e-scribed Return in about 4 weeks for reassessment - may need dexa scan Vit D and calcium check.

## 2023-12-05 ENCOUNTER — Encounter: Payer: Self-pay | Admitting: Family Medicine

## 2023-12-05 ENCOUNTER — Telehealth: Payer: Self-pay

## 2023-12-05 ENCOUNTER — Other Ambulatory Visit: Payer: Self-pay | Admitting: Family Medicine

## 2023-12-05 ENCOUNTER — Other Ambulatory Visit (HOSPITAL_COMMUNITY): Payer: Self-pay

## 2023-12-05 ENCOUNTER — Other Ambulatory Visit: Payer: Self-pay

## 2023-12-05 NOTE — Telephone Encounter (Signed)
Pharmacy Patient Advocate Encounter  Received notification from [facility] MEDICAID that Prior Authorization for lidocaine 5% patches has been DENIED.  Full denial letter will be uploaded to the media tab. See denial reason below.  Per your health plan's criteria, this drug is covered if you meet the following:  One of the following:   (A) You have nerve pain due to shingles (post-herpetic neuralgia).   (B) Both of the following:  (I) You have nerve pain (neuropathic pain).  (II) You have tried or cannot use two of the following drug classes: (a) Tri-cyclic antidepressants: amitriptyline, desipramine, imipramine and nortriptyline. (b) Selective serotonin reuptake inhibitors: citalopram, escitalopram, fluoxetine, paroxetine and sertraline. (c) Serotonin and norepinephrine reuptake inhibitors: desvenlafaxine succinate extendedrelease, duloxetine (20mg , 30mg  and 60mg  strengths only) and venlafaxine. (d) Anticonvulsants: gabapentin and pregabalin. (e) Non-steroidal anti-inflammatory drugs: ibuprofen, indomethacin, meloxicam, naproxen and sulindac. (f) Cyclooxygenase-2 (COX II) inhibitors: celecoxib capsule.  (C) Both of the following: (I) You have long term muscle skeletal pain (chronic musculo-skeletal pain greater than six months in duration). (II) You have tried or cannot use two of the following drug classes: (a) Tri-cyclic antidepressants: amitriptyline, desipramine, imipramine and nortriptyline. (b) Selective serotonin reuptake inhibitors: citalopram, escitalopram, fluoxetine, paroxetine and sertraline. (c) Serotonin and norepinephrine reuptake inhibitors: desvenlafaxine succinate extendedrelease, duloxetine (20mg , 30mg  and 60mg  strengths only) and venlafaxine. (d) Anticonvulsants: gabapentin and pregabalin. (e) Non-steroidal anti-inflammatory drugs: ibuprofen, indomethacin, meloxicam, naproxen and sulindac. (f) Cyclooxygenase-2 (COX II) inhibitors: celecoxib capsule.   PA #/Case ID/Reference #:   [id number]

## 2023-12-05 NOTE — Telephone Encounter (Signed)
Pharmacy Patient Advocate Encounter   Received notification from CoverMyMeds that prior authorization for Calcitonin ([place]) 200UNIT/ACT solution is required/requested.   Insurance verification completed.   The patient is insured through [facility] MEDICAID .  PA required; PA submitted to above mentioned insurance via CoverMyMeds Key/confirmation #/EOC [id number]. Status is pending  Preferred meds: alendronate, raloxifene

## 2023-12-05 NOTE — Telephone Encounter (Signed)
Pharmacy Patient Advocate Encounter  Received notification from [facility] MEDICAID that Prior Authorization for Calcitonin Carmie Kanner) 200UNIT/ACT solution has been DENIED.  Full denial letter will be uploaded to the media tab. See denial reason below.  Per your health plan's criteria, this drug is covered if you meet the following:   One of the following:  (1) You have failed two preferred drugs as confirmed by claims history or submission of medical records. The preferred drugs: alendronate tablet (generic for Fosamax), raloxifene tablet (generic for Evista).  (2) You cannot use two preferred drugs (please specify contraindication or intolerance).   PA #/Case ID/Reference #: [id number]

## 2023-12-05 NOTE — Telephone Encounter (Signed)
Pharmacy Patient Advocate Encounter   Received notification from CoverMyMeds that prior authorization for LIDOCAINE 5% PATCHES is required/requested.   Insurance verification completed.   The patient is insured through [facility] MEDICAID .   PA required; PA submitted to above mentioned insurance via CoverMyMeds Key/confirmation #/EOC [id number]. Status is pending

## 2023-12-09 ENCOUNTER — Other Ambulatory Visit: Payer: Self-pay | Admitting: Family Medicine

## 2023-12-09 MED ORDER — TRAMADOL HCL 50 MG PO TABS: 50.0000 mg | ORAL_TABLET | Freq: Two times a day (BID) | ORAL | 0 refills | Status: DC | PRN

## 2023-12-15 ENCOUNTER — Other Ambulatory Visit: Payer: Self-pay | Admitting: Family Medicine

## 2023-12-15 ENCOUNTER — Encounter: Payer: Self-pay | Admitting: Family Medicine

## 2023-12-18 ENCOUNTER — Encounter (INDEPENDENT_AMBULATORY_CARE_PROVIDER_SITE_OTHER): Payer: Self-pay | Admitting: Family Medicine

## 2023-12-18 DIAGNOSIS — R632 Polyphagia: Secondary | ICD-10-CM

## 2023-12-18 DIAGNOSIS — R7303 Prediabetes: Secondary | ICD-10-CM

## 2023-12-18 MED ORDER — WEGOVY 0.5 MG/0.5ML ~~LOC~~ SOAJ: 0.5000 mg | SUBCUTANEOUS | 0 refills | Status: DC

## 2023-12-18 NOTE — Telephone Encounter (Signed)
Ok to rf x 1

## 2023-12-18 NOTE — Telephone Encounter (Signed)
Duplicate encounter. Patient sent multiple messages in reference to the same thing.

## 2023-12-22 ENCOUNTER — Telehealth (INDEPENDENT_AMBULATORY_CARE_PROVIDER_SITE_OTHER): Payer: Self-pay | Admitting: Family Medicine

## 2023-12-22 ENCOUNTER — Encounter (INDEPENDENT_AMBULATORY_CARE_PROVIDER_SITE_OTHER): Payer: Self-pay | Admitting: Family Medicine

## 2023-12-22 ENCOUNTER — Other Ambulatory Visit (INDEPENDENT_AMBULATORY_CARE_PROVIDER_SITE_OTHER): Payer: Self-pay | Admitting: Family Medicine

## 2023-12-22 DIAGNOSIS — R7303 Prediabetes: Secondary | ICD-10-CM

## 2023-12-22 MED ORDER — METFORMIN HCL 500 MG PO TABS: ORAL_TABLET | ORAL | 0 refills | Status: DC

## 2023-12-22 NOTE — Telephone Encounter (Signed)
 02/17 pt needs refill for metformin

## 2023-12-23 ENCOUNTER — Encounter: Payer: Self-pay | Admitting: Family Medicine

## 2023-12-23 ENCOUNTER — Other Ambulatory Visit: Payer: Self-pay | Admitting: Family Medicine

## 2023-12-23 ENCOUNTER — Other Ambulatory Visit (HOSPITAL_COMMUNITY): Payer: Self-pay

## 2023-12-23 ENCOUNTER — Ambulatory Visit (INDEPENDENT_AMBULATORY_CARE_PROVIDER_SITE_OTHER): Payer: Medicaid Other | Admitting: Family Medicine

## 2023-12-23 VITALS — BP 137/72 | HR 72 | Ht 62.0 in | Wt 277.0 lb

## 2023-12-23 DIAGNOSIS — S22000D Wedge compression fracture of unspecified thoracic vertebra, subsequent encounter for fracture with routine healing: Secondary | ICD-10-CM

## 2023-12-23 DIAGNOSIS — Z86711 Personal history of pulmonary embolism: Secondary | ICD-10-CM

## 2023-12-23 DIAGNOSIS — Z86718 Personal history of other venous thrombosis and embolism: Secondary | ICD-10-CM

## 2023-12-23 MED ORDER — TIZANIDINE HCL 4 MG PO TABS
4.0000 mg | ORAL_TABLET | Freq: Two times a day (BID) | ORAL | 1 refills | Status: DC | PRN
  Filled 2023-12-23: qty 60, 20d supply, fill #0
  Filled 2024-01-15 – 2024-01-16 (×2): qty 60, 20d supply, fill #1

## 2023-12-23 MED ORDER — GABAPENTIN 100 MG PO CAPS
100.0000 mg | ORAL_CAPSULE | Freq: Three times a day (TID) | ORAL | 0 refills | Status: DC
  Filled 2023-12-23: qty 90, 30d supply, fill #0

## 2023-12-23 NOTE — Assessment & Plan Note (Signed)
 Seems to be slowly going in the right direction Continue back brace F/U PT as discussed I refilled Tizanidine Start Gabapentin for pain and d/c Tramadol Consider repeat imaging vs referral to orthopedic if there remains no further improvement She agreed with the plan

## 2023-12-23 NOTE — Progress Notes (Signed)
    SUBJECTIVE:   CHIEF COMPLAINT / HPI:   Compression fracture: Mid-back pain improved. However, sometimes, she feels a sharp nerve-like pain shotting through her back to her rib/side B/L. The pain can be very intense. She is out of her Tizanidine. Tramadol helped some, but she wants something for nerve pain. No recent fall.  DVT/PE: On ASA due to hx of ICH in the past.   PERTINENT  PMH / PSH: PMhx reviewed  OBJECTIVE:   BP 137/72   Pulse 72   Ht 5\' 2"  (1.575 m)   Wt 277 lb (125.6 kg)   SpO2 98%   BMI 50.66 kg/m   Physical Exam Vitals and nursing note reviewed.  Cardiovascular:     Rate and Rhythm: Normal rate and regular rhythm.     Heart sounds: Normal heart sounds. No murmur heard. Pulmonary:     Effort: Pulmonary effort is normal. No respiratory distress.     Breath sounds: Normal breath sounds. No wheezing.  Musculoskeletal:     Thoracic back: Tenderness present. Decreased range of motion.     Lumbar back: Normal.     Comments: No erythema of the skin of her back or chest      ASSESSMENT/PLAN:   Thoracic compression fracture (HCC) Seems to be slowly going in the right direction Continue back brace F/U PT as discussed I refilled Tizanidine Start Gabapentin for pain and d/c Tramadol Consider repeat imaging vs referral to orthopedic if there remains no further improvement She agreed with the plan  History of pulmonary embolism As discussed, since she has had both DVT and PE, due to high risk of VTE, she can get back on anticoagulants given it's been a while since she had ICH She prefers to be on ASA for now May resume anticoagulant if she has another VTE  History of DVT of lower extremity As discussed, since she has had both DVT and PE, due to high risk of VTE, she can get back on anticoagulants given it's been a while since she had ICH She prefers to be on ASA for now May resume anticoagulant if she has another VTE    PCV20 offered. She declined for  now  Janit Pagan, MD [facility]

## 2023-12-23 NOTE — Assessment & Plan Note (Signed)
 As discussed, since she has had both DVT and PE, due to high risk of VTE, she can get back on anticoagulants given it's been a while since she had ICH She prefers to be on ASA for now May resume anticoagulant if she has another VTE

## 2023-12-23 NOTE — Patient Instructions (Addendum)
 Gabapentin Capsules or Tablets What is this medication? GABAPENTIN (GA ba pen tin) treats nerve pain. It may also be used to prevent and control seizures in people with epilepsy. It works by calming overactive nerves in your body. This medicine may be used for other purposes; ask your health care provider or pharmacist if you have questions. COMMON BRAND NAME(S): Active-PAC with Gabapentin, Ascencion Dike, Gralise, Neurontin What should I tell my care team before I take this medication? They need to know if you have any of these conditions: Kidney disease Lung or breathing disease Substance use disorder Suicidal thoughts, plans, or attempt by you or a family member An unusual or allergic reaction to gabapentin, other medications, foods, dyes, or preservatives Pregnant or trying to get pregnant Breastfeeding How should I use this medication? Take this medication by mouth with a glass of water. Follow the directions on the prescription label. You can take it with or without food. If it upsets your stomach, take it with food. Take your medication at regular intervals. Do not take it more often than directed. Do not stop taking except on your care team's advice. If you are directed to break the 600 or 800 mg tablets in half as part of your dose, the extra half tablet should be used for the next dose. If you have not used the extra half tablet within 28 days, it should be thrown away. A special MedGuide will be given to you by the pharmacist with each prescription and refill. Be sure to read this information carefully each time. Talk to your care team about the use of this medication in children. While this medication may be prescribed for children as young as 3 years for selected conditions, precautions do apply. Overdosage: If you think you have taken too much of this medicine contact a poison control center or emergency room at once. NOTE: This medicine is only for you. Do not share this medicine with  others. What if I miss a dose? If you miss a dose, take it as soon as you can. If it is almost time for your next dose, take only that dose. Do not take double or extra doses. What may interact with this medication? Alcohol Antihistamines for allergy, cough, and cold Certain medications for anxiety or sleep Certain medications for depression like amitriptyline, fluoxetine, sertraline Certain medications for seizures like phenobarbital, primidone Certain medications for stomach problems General anesthetics like halothane, isoflurane, methoxyflurane, propofol Local anesthetics like lidocaine, pramoxine, tetracaine Medications that relax muscles for surgery Opioid medications for pain Phenothiazines like chlorpromazine, mesoridazine, prochlorperazine, thioridazine This list may not describe all possible interactions. Give your health care provider a list of all the medicines, herbs, non-prescription drugs, or dietary supplements you use. Also tell them if you smoke, drink alcohol, or use illegal drugs. Some items may interact with your medicine. What should I watch for while using this medication? Visit your care team for regular checks on your progress. You may want to keep a record at home of how you feel your condition is responding to treatment. You may want to share this information with your care team at each visit. You should contact your care team if your seizures get worse or if you have any new types of seizures. Do not stop taking this medication or any of your seizure medications unless instructed by your care team. Stopping your medication suddenly can increase your seizures or their severity. This medication may cause serious skin reactions. They can happen weeks to  months after starting the medication. Contact your care team right away if you notice fevers or flu-like symptoms with a rash. The rash may be red or purple and then turn into blisters or peeling of the skin. Or, you might  notice a red rash with swelling of the face, lips or lymph nodes in your neck or under your arms. Wear a medical identification bracelet or chain if you are taking this medication for seizures. Carry a card that lists all your medications. This medication may affect your coordination, reaction time, or judgment. Do not drive or operate machinery until you know how this medication affects you. Sit up or stand slowly to reduce the risk of dizzy or fainting spells. Drinking alcohol with this medication can increase the risk of these side effects. Your mouth may get dry. Chewing sugarless gum or sucking hard candy, and drinking plenty of water may help. Watch for new or worsening thoughts of suicide or depression. This includes sudden changes in mood, behaviors, or thoughts. These changes can happen at any time but are more common in the beginning of treatment or after a change in dose. Call your care team right away if you experience these thoughts or worsening depression. If you become pregnant while using this medication, you may enroll in the [place] American Antiepileptic Drug Pregnancy Registry by calling [phone]. This registry collects information about the safety of antiepileptic medication use during pregnancy. What side effects may I notice from receiving this medication? Side effects that you should report to your care team as soon as possible: Allergic reactions or angioedema--skin rash, itching, hives, swelling of the face, eyes, lips, tongue, arms, or legs, trouble swallowing or breathing Rash, fever, and swollen lymph nodes Thoughts of suicide or self harm, worsening mood, feelings of depression Trouble breathing Unusual changes in mood or behavior in children after use such as difficulty concentrating, hostility, or restlessness Side effects that usually do not require medical attention (report to your care team if they continue or are  bothersome): Dizziness Drowsiness Nausea Swelling of ankles, feet, or hands Vomiting This list may not describe all possible side effects. Call your doctor for medical advice about side effects. You may report side effects to FDA at 1-800-FDA-1088. Where should I keep my medication? Keep out of reach of children and pets. Store at room temperature between 15 and 30 degrees C (59 and 86 degrees F). Get rid of any unused medication after the expiration date. This medication may cause accidental overdose and death if taken by other adults, children, or pets. To get rid of medications that are no longer needed or have expired: Take the medication to a medication take-back program. Check with your pharmacy or law enforcement to find a location. If you cannot return the medication, check the label or package insert to see if the medication should be thrown out in the garbage or flushed down the toilet. If you are not sure, ask your care team. If it is safe to put it in the trash, empty the medication out of the container. Mix the medication with cat litter, dirt, coffee grounds, or other unwanted substance. Seal the mixture in a bag or container. Put it in the trash. NOTE: This sheet is a summary. It may not cover all possible information. If you have questions about this medicine, talk to your doctor, pharmacist, or health care provider.  2024 Elsevier/Gold Standard (2022-08-06 00:00:00)

## 2023-12-25 ENCOUNTER — Encounter: Payer: Self-pay | Admitting: Family Medicine

## 2023-12-25 ENCOUNTER — Ambulatory Visit (INDEPENDENT_AMBULATORY_CARE_PROVIDER_SITE_OTHER): Payer: Medicaid Other | Admitting: Family Medicine

## 2024-01-01 ENCOUNTER — Other Ambulatory Visit (HOSPITAL_COMMUNITY): Payer: Self-pay

## 2024-01-01 ENCOUNTER — Encounter (INDEPENDENT_AMBULATORY_CARE_PROVIDER_SITE_OTHER): Payer: Self-pay | Admitting: Family Medicine

## 2024-01-01 ENCOUNTER — Ambulatory Visit (INDEPENDENT_AMBULATORY_CARE_PROVIDER_SITE_OTHER): Payer: Medicaid Other | Admitting: Family Medicine

## 2024-01-01 VITALS — BP 122/78 | HR 81 | Temp 97.6°F | Ht 62.0 in | Wt 274.0 lb

## 2024-01-01 DIAGNOSIS — Z9189 Other specified personal risk factors, not elsewhere classified: Secondary | ICD-10-CM

## 2024-01-01 DIAGNOSIS — E669 Obesity, unspecified: Secondary | ICD-10-CM

## 2024-01-01 DIAGNOSIS — S22000D Wedge compression fracture of unspecified thoracic vertebra, subsequent encounter for fracture with routine healing: Secondary | ICD-10-CM

## 2024-01-01 DIAGNOSIS — I10 Essential (primary) hypertension: Secondary | ICD-10-CM

## 2024-01-01 DIAGNOSIS — R7303 Prediabetes: Secondary | ICD-10-CM

## 2024-01-01 DIAGNOSIS — R632 Polyphagia: Secondary | ICD-10-CM

## 2024-01-01 DIAGNOSIS — S22050S Wedge compression fracture of T5-T6 vertebra, sequela: Secondary | ICD-10-CM

## 2024-01-01 DIAGNOSIS — Z6841 Body Mass Index (BMI) 40.0 and over, adult: Secondary | ICD-10-CM

## 2024-01-01 DIAGNOSIS — M4850XA Collapsed vertebra, not elsewhere classified, site unspecified, initial encounter for fracture: Secondary | ICD-10-CM | POA: Diagnosis not present

## 2024-01-01 DIAGNOSIS — K5900 Constipation, unspecified: Secondary | ICD-10-CM | POA: Diagnosis not present

## 2024-01-01 MED ORDER — HYDROCHLOROTHIAZIDE 25 MG PO TABS
25.0000 mg | ORAL_TABLET | Freq: Every day | ORAL | 0 refills | Status: DC
  Filled 2024-01-01 – 2024-01-02 (×2): qty 30, 30d supply, fill #0

## 2024-01-01 MED ORDER — METFORMIN HCL 500 MG PO TABS
500.0000 mg | ORAL_TABLET | Freq: Three times a day (TID) | ORAL | 0 refills | Status: DC
  Filled 2024-01-01: qty 90, 30d supply, fill #0

## 2024-01-01 MED ORDER — LOSARTAN POTASSIUM 50 MG PO TABS
50.0000 mg | ORAL_TABLET | Freq: Every day | ORAL | 0 refills | Status: DC
  Filled 2024-01-01: qty 30, 30d supply, fill #0

## 2024-01-01 MED ORDER — WEGOVY 0.5 MG/0.5ML ~~LOC~~ SOAJ
0.5000 mg | SUBCUTANEOUS | 0 refills | Status: DC
  Filled 2024-01-01 – 2024-01-15 (×4): qty 2, 28d supply, fill #0

## 2024-01-01 NOTE — Progress Notes (Signed)
 .smr  Office: [phone]  /  Fax: [phone]  WEIGHT SUMMARY AND BIOMETRICS  Anthropometric Measurements Height: 5\' 2"  (1.575 m) Weight: 274 lb (124.3 kg) BMI (Calculated): 50.1 Weight at Last Visit: 273 lb Weight Lost Since Last Visit: 0 Weight Gained Since Last Visit: 1 lb Starting Weight: 324 lb Total Weight Loss (lbs): 50 lb (22.7 kg) Peak Weight: 335 lb   Body Composition  Body Fat %: 60.1 % Fat Mass (lbs): 165 lbs Muscle Mass (lbs): 104 lbs Visceral Fat Rating : 23   Other Clinical Data Fasting: yes Labs: no Today's Visit #: 55 Starting Date: 01/03/21    Chief Complaint: OBESITY    History of Present Illness   Carla Hanson "Carla Hanson" is a 58 year old female with obesity and prediabetes who presents for obesity treatment plan and progress monitoring.  She has been struggling to adhere to her low-carb diet due to recent injuries that have limited her ability to exercise. Over the past two months, she has gained one pound. She is currently taking Wegovy to aid in weight loss and improve her prediabetes and requests a refill today. In addition to [facility], she is managing her prediabetes with metformin, diet, exercise, and weight loss efforts. She experiences stomach cramping, which she associates with constipation, and uses prune juice for relief. No nausea is present. She is not experiencing any issues with her medications.  Recently, she suffered a compression fracture while working out, resulting in radiating nerve pain. She has been on oxycodone, tizanidine, and gabapentin for pain management, though she stopped taking gabapentin on March 4th. She is increasing her calcium intake by consuming [employer] milk, which contains 50% more calcium, and is taking over-the-counter vitamin D, likely 5000 IU daily, to support bone health. She has been allowed to walk as tolerated to regain strength.  She has a history of hypertension, which is controlled with losartan  and hydrochlorothiazide. She requests a refill of her hypertension medications. Her blood pressure today is 122/78 mmHg.  She experiences constipation, likely exacerbated by [facility] and pain medications, and reports stomach cramping but no nausea.          PHYSICAL EXAM:  Blood pressure 122/78, pulse 81, temperature 97.6 F (36.4 C), height 5\' 2"  (1.575 m), weight 274 lb (124.3 kg), SpO2 96%. Body mass index is 50.12 kg/m.  DIAGNOSTIC DATA REVIEWED:  BMET    Component Value Date/Time   NA 139 11/17/2023 0932   NA 140 10/21/2023 0828   K 3.5 11/17/2023 0932   CL 102 11/17/2023 0932   CO2 27 11/17/2023 0932   GLUCOSE 107 (H) 11/17/2023 0932   BUN 17 11/17/2023 0932   BUN 12 10/21/2023 0828   CREATININE 0.57 11/17/2023 0932   CREATININE 0.57 11/09/2014 1032   CALCIUM 10.1 11/17/2023 0932   GFRNONAA >60 11/17/2023 0932   GFRNONAA >89 12/29/2013 0942   GFRAA 127 12/08/2019 1210   GFRAA >89 12/29/2013 0942   Lab Results  Component Value Date   HGBA1C 6.2 (H) 07/29/2023   HGBA1C 6.0 12/31/2012   Lab Results  Component Value Date   INSULIN 36.7 (H) 07/29/2023   INSULIN 77.3 (H) 01/03/2021   Lab Results  Component Value Date   TSH 1.630 07/22/2022   CBC    Component Value Date/Time   WBC 11.6 (H) 11/17/2023 0932   RBC 4.69 11/17/2023 0932   HGB 13.1 11/17/2023 0932   HGB 12.7 07/22/2022 0749   HCT 39.9 11/17/2023 0932  HCT 39.9 07/22/2022 0749   PLT 305 11/17/2023 0932   PLT 314 07/22/2022 0749   MCV 85.1 11/17/2023 0932   MCV 81 07/22/2022 0749   MCH 27.9 11/17/2023 0932   MCHC 32.8 11/17/2023 0932   RDW 14.5 11/17/2023 0932   RDW 14.4 07/22/2022 0749   Iron Studies    Component Value Date/Time   IRON 32 12/03/2016 1442   TIBC 363 12/03/2016 1442   FERRITIN 13 (L) 02/09/2018 1501   IRONPCTSAT 9 (LL) 12/03/2016 1442   Lipid Panel     Component Value Date/Time   CHOL 138 03/25/2023 0748   TRIG 121 03/25/2023 0748   HDL 44 03/25/2023 0748    CHOLHDL 5.7 (H) 05/15/2021 0929   CHOLHDL 6.3 (H) 01/08/2017 0947   VLDL 40 (H) 01/08/2017 0947   LDLCALC 72 03/25/2023 0748   Hepatic Function Panel     Component Value Date/Time   PROT 9.9 (H) 11/17/2023 0932   PROT 7.7 07/29/2023 0748   ALBUMIN 4.0 11/17/2023 0932   ALBUMIN 4.0 07/29/2023 0748   AST 14 (L) 11/17/2023 0932   ALT 14 11/17/2023 0932   ALKPHOS 45 11/17/2023 0932   BILITOT 0.5 11/17/2023 0932   BILITOT <0.2 07/29/2023 0748      Component Value Date/Time   TSH 1.630 07/22/2022 0749   Nutritional Lab Results  Component Value Date   VD25OH 70.4 07/29/2023   VD25OH 63.8 03/25/2023   VD25OH 56.8 12/18/2022     Assessment and Plan    Obesity Struggling to follow low-carb diet due to recent injuries and inability to exercise. Gained one pound in the last two months. Discussed importance of maintaining a healthy diet and portion control. Provided detailed low-carb diet plan. Discussed potential GI side effects of Wegovy, including constipation and abdominal pain, and recommended MiraLAX for management. Emphasized avoiding simple carbohydrates to prevent GI upset. - Refill Wegovy - Refill metformin - Refill losartan and hydrochlorothiazide - Recommend portion control - Provide low-carb diet plan - Recommend MiraLAX daily while on Wegovy  Prediabetes On Wegovy and metformin. Discussed importance of diet and exercise in managing blood sugar levels. No current issues with medications. Emphasized regular blood sugar monitoring. - Continue Wegovy - Continue metformin -Continue diet/weight loss to treat prediabetes  Constipation Experiencing constipation likely due to [facility] and pain medications. Discussed use of MiraLAX. Recommended avoiding prune juice to prevent GI nausea. - Recommend MiraLAX daily while on Wegovy  Hypertension Blood pressure well-controlled at 122/78 mmHg with losartan and hydrochlorothiazide. No current issues with medications. - Refill  losartan and hydrochlorothiazide - Continue monitoring blood pressure -Continue diet/weight loss  Compression Fracture and at risk for osteoporsis Sustained a compression fracture while working out. She is being treated by her PCP for this. Currently on Oxycodone, tizanidine, and gabapentin for pain management. Discussed importance of calcium and vitamin D for bone health. Increasing calcium intake and considering a bone scan once healed. Discussed gradual increase in physical activity as tolerated. - Continue Oxycodone, tizanidine, and gabapentin as needed - Increase calcium intake - Consider bone scan once healed - Continue over-the-counter vitamin D 5000 IU daily -Will continue to work on weight bearing exercise to help prevent osteoporosis 8-15 minutes of the visit today was on counseling for her risk  General Health Maintenance Discussed importance of hydration, especially on a low-carb diet. Taking over-the-counter vitamin D and increasing calcium intake. Emphasized hydration to prevent dehydration and headaches. - Continue over-the-counter vitamin D 5000 IU daily - Increase hydration  Follow-up - Follow-up appointment on January 29, 2024 - Schedule April appointment.        She was informed of the importance of frequent follow up visits to maximize her success with intensive lifestyle modifications for her multiple health conditions.    Quillian Quince, MD

## 2024-01-02 ENCOUNTER — Other Ambulatory Visit (HOSPITAL_COMMUNITY): Payer: Self-pay

## 2024-01-06 ENCOUNTER — Encounter: Payer: Self-pay | Admitting: Family Medicine

## 2024-01-07 ENCOUNTER — Other Ambulatory Visit (HOSPITAL_COMMUNITY): Payer: Self-pay

## 2024-01-07 ENCOUNTER — Other Ambulatory Visit: Payer: Self-pay

## 2024-01-12 ENCOUNTER — Ambulatory Visit (INDEPENDENT_AMBULATORY_CARE_PROVIDER_SITE_OTHER): Payer: Medicaid Other | Admitting: Family Medicine

## 2024-01-14 ENCOUNTER — Other Ambulatory Visit (HOSPITAL_COMMUNITY): Payer: Self-pay

## 2024-01-15 ENCOUNTER — Other Ambulatory Visit: Payer: Self-pay

## 2024-01-15 ENCOUNTER — Other Ambulatory Visit (HOSPITAL_COMMUNITY): Payer: Self-pay

## 2024-01-15 ENCOUNTER — Encounter (INDEPENDENT_AMBULATORY_CARE_PROVIDER_SITE_OTHER): Payer: Self-pay | Admitting: Family Medicine

## 2024-01-15 DIAGNOSIS — R632 Polyphagia: Secondary | ICD-10-CM

## 2024-01-15 DIAGNOSIS — R7303 Prediabetes: Secondary | ICD-10-CM

## 2024-01-16 ENCOUNTER — Other Ambulatory Visit (HOSPITAL_COMMUNITY): Payer: Self-pay

## 2024-01-19 ENCOUNTER — Encounter: Payer: Self-pay | Admitting: Family Medicine

## 2024-01-20 ENCOUNTER — Other Ambulatory Visit (HOSPITAL_COMMUNITY): Payer: Self-pay

## 2024-01-20 ENCOUNTER — Telehealth (INDEPENDENT_AMBULATORY_CARE_PROVIDER_SITE_OTHER): Payer: Self-pay

## 2024-01-20 ENCOUNTER — Telehealth

## 2024-01-20 MED ORDER — WEGOVY 0.5 MG/0.5ML ~~LOC~~ SOAJ: 0.5000 mg | SUBCUTANEOUS | 0 refills | Status: DC

## 2024-01-20 NOTE — Telephone Encounter (Signed)
 Ok to refill x 1, same dose

## 2024-01-20 NOTE — Telephone Encounter (Signed)
Prior auth submitted for [employer].  Awaiting determination.

## 2024-01-20 NOTE — Progress Notes (Signed)
 The patient no-showed for appointment despite this provider sending direct link with no response and waiting for at least 10 minutes from appointment time for patient to join. They will be marked as a NS for this appointment/time.   Piedad Climes, PA-C

## 2024-01-20 NOTE — Telephone Encounter (Signed)
 [employer] [facility] [place] has reviewed the request for [employer] Inj 0.5mg  submitted by [facility] on behalf of Carla Hanson on 01/20/2024. After review, the request for service is: Approved through 01/19/2025.  Patient notified via my chart.

## 2024-01-21 ENCOUNTER — Other Ambulatory Visit (HOSPITAL_COMMUNITY): Payer: Self-pay

## 2024-01-21 ENCOUNTER — Other Ambulatory Visit: Payer: Self-pay

## 2024-01-21 ENCOUNTER — Other Ambulatory Visit (INDEPENDENT_AMBULATORY_CARE_PROVIDER_SITE_OTHER): Payer: Self-pay | Admitting: Family Medicine

## 2024-01-21 DIAGNOSIS — R7303 Prediabetes: Secondary | ICD-10-CM

## 2024-01-21 DIAGNOSIS — R632 Polyphagia: Secondary | ICD-10-CM

## 2024-01-23 ENCOUNTER — Other Ambulatory Visit: Payer: Self-pay | Admitting: Family Medicine

## 2024-01-23 ENCOUNTER — Other Ambulatory Visit (HOSPITAL_COMMUNITY): Payer: Self-pay

## 2024-01-23 ENCOUNTER — Telehealth (HOSPITAL_BASED_OUTPATIENT_CLINIC_OR_DEPARTMENT_OTHER): Payer: Self-pay | Admitting: Physical Therapy

## 2024-01-23 DIAGNOSIS — J3089 Other allergic rhinitis: Secondary | ICD-10-CM

## 2024-01-23 MED ORDER — CETIRIZINE HCL 10 MG PO TABS
10.0000 mg | ORAL_TABLET | Freq: Every day | ORAL | 1 refills | Status: DC
  Filled 2024-01-23: qty 30, 30d supply, fill #0
  Filled 2024-02-06: qty 90, 90d supply, fill #0
  Filled 2024-05-04: qty 30, 30d supply, fill #1
  Filled 2024-06-03: qty 30, 30d supply, fill #2
  Filled 2024-07-05: qty 30, 30d supply, fill #3

## 2024-01-23 NOTE — Telephone Encounter (Signed)
 Called and LVM to remind patient of upcoming physical therapy evaluation appointment. Requested call back to confirm appointment attendance.

## 2024-01-25 ENCOUNTER — Other Ambulatory Visit (INDEPENDENT_AMBULATORY_CARE_PROVIDER_SITE_OTHER): Payer: Self-pay | Admitting: Family Medicine

## 2024-01-25 DIAGNOSIS — I10 Essential (primary) hypertension: Secondary | ICD-10-CM

## 2024-01-25 DIAGNOSIS — R7303 Prediabetes: Secondary | ICD-10-CM

## 2024-01-26 ENCOUNTER — Ambulatory Visit (HOSPITAL_BASED_OUTPATIENT_CLINIC_OR_DEPARTMENT_OTHER): Payer: Medicaid Other | Admitting: Physical Therapy

## 2024-01-26 ENCOUNTER — Other Ambulatory Visit (INDEPENDENT_AMBULATORY_CARE_PROVIDER_SITE_OTHER): Payer: Self-pay | Admitting: Family Medicine

## 2024-01-26 DIAGNOSIS — I10 Essential (primary) hypertension: Secondary | ICD-10-CM

## 2024-01-26 DIAGNOSIS — R7303 Prediabetes: Secondary | ICD-10-CM

## 2024-01-27 ENCOUNTER — Telehealth: Payer: Self-pay

## 2024-01-27 NOTE — Telephone Encounter (Signed)
 Patient calls nurse line requesting to speak with PCP.   She reports she has been nauseated for ~ 2 days. She denies any changes to her medications. She reports she has been on current dose of Wegovy for ~ 3 months.   She denies any abdominal pain, diarrhea, constipation, vomiting, fevers or chills.   Patient advised PCP is out of the office this week and virtual apt offered with another provider.   Patient declined stating she only wants to see PCP.   ED precautions discussed with patient.   She does have an apt with PCP scheduled for 4/1.  Will forward to PCP.

## 2024-01-29 ENCOUNTER — Encounter (INDEPENDENT_AMBULATORY_CARE_PROVIDER_SITE_OTHER): Payer: Self-pay | Admitting: Family Medicine

## 2024-01-29 ENCOUNTER — Ambulatory Visit (INDEPENDENT_AMBULATORY_CARE_PROVIDER_SITE_OTHER): Payer: Medicaid Other | Admitting: Family Medicine

## 2024-01-29 VITALS — BP 178/94 | HR 85 | Ht 62.0 in | Wt 253.0 lb

## 2024-01-29 DIAGNOSIS — R110 Nausea: Secondary | ICD-10-CM

## 2024-01-29 DIAGNOSIS — I10 Essential (primary) hypertension: Secondary | ICD-10-CM | POA: Diagnosis not present

## 2024-01-29 DIAGNOSIS — E669 Obesity, unspecified: Secondary | ICD-10-CM

## 2024-01-29 DIAGNOSIS — R5383 Other fatigue: Secondary | ICD-10-CM

## 2024-01-29 DIAGNOSIS — R632 Polyphagia: Secondary | ICD-10-CM | POA: Diagnosis not present

## 2024-01-29 DIAGNOSIS — R7303 Prediabetes: Secondary | ICD-10-CM

## 2024-01-29 DIAGNOSIS — Z6841 Body Mass Index (BMI) 40.0 and over, adult: Secondary | ICD-10-CM | POA: Diagnosis not present

## 2024-01-29 NOTE — Progress Notes (Signed)
 Office: [phone]  /  Fax: [phone]  WEIGHT SUMMARY AND BIOMETRICS  Anthropometric Measurements Height: 5\' 2"  (1.575 m) Weight: 253 lb (114.8 kg) BMI (Calculated): 46.26 Weight at Last Visit: 274 lb Weight Lost Since Last Visit: 21 lb Weight Gained Since Last Visit: 0 Starting Weight: 324 lb Total Weight Loss (lbs): 71 lb (32.2 kg) Peak Weight: 335 lb   Body Composition  Body Fat %: 56.5 % Fat Mass (lbs): 143 lbs Muscle Mass (lbs): 104.4 lbs Visceral Fat Rating : 20   Other Clinical Data Fasting: No Labs: Yes Today's Visit #: 56 Starting Date: 01/03/21    Chief Complaint: OBESITY   .  History of Present Illness   Carla Hanson "Carla Hanson" is a 58 year old female who presents for obesity treatment plan assessment and progress evaluation.  She has been following her category two eating plan 85% of the time and has not been exercising. Despite this, she has lost 21 pounds in the last month since her last visit.  She experienced a 'little stomach virus' recently, which she is recovering from. She describes symptoms of being 'very nauseated' but has no diarrhea, fever, or chills. During this time, she was unable to eat and felt very thirsty.  She has been on [facility] for about five months and did not miss any doses except for a brief waiting period of two to three days. She feels that the medication might be worsening her nausea. She has been taking losartan and hydrochlorothiazide inconsistently, particularly when she was not feeling well, but has since resumed taking them. She is currently holding off on metformin due to her symptoms. Her current Wegovy dose is 0.5 mg.  She feels lightheaded, dizzy, and weak, which she attributes to dehydration. She has been drinking 'tons of water' to address this, aiming for 100 ounces a day. She mentions feeling 'woozy' and 'weak' and has been sleeping a lot, indicating fatigue. Her gait is more unsteady than usual today. No  focal weakness noted, no facial droop or asymmetry seen.          PHYSICAL EXAM:  Blood pressure (!) 178/94, pulse 85, height 5\' 2"  (1.575 m), weight 253 lb (114.8 kg), SpO2 98%. Body mass index is 46.27 kg/m.  DIAGNOSTIC DATA REVIEWED:  BMET    Component Value Date/Time   NA 139 11/17/2023 0932   NA 140 10/21/2023 0828   K 3.5 11/17/2023 0932   CL 102 11/17/2023 0932   CO2 27 11/17/2023 0932   GLUCOSE 107 (H) 11/17/2023 0932   BUN 17 11/17/2023 0932   BUN 12 10/21/2023 0828   CREATININE 0.57 11/17/2023 0932   CREATININE 0.57 11/09/2014 1032   CALCIUM 10.1 11/17/2023 0932   GFRNONAA >60 11/17/2023 0932   GFRNONAA >89 12/29/2013 0942   GFRAA 127 12/08/2019 1210   GFRAA >89 12/29/2013 0942   Lab Results  Component Value Date   HGBA1C 6.2 (H) 07/29/2023   HGBA1C 6.0 12/31/2012   Lab Results  Component Value Date   INSULIN 36.7 (H) 07/29/2023   INSULIN 77.3 (H) 01/03/2021   Lab Results  Component Value Date   TSH 1.630 07/22/2022   CBC    Component Value Date/Time   WBC 11.6 (H) 11/17/2023 0932   RBC 4.69 11/17/2023 0932   HGB 13.1 11/17/2023 0932   HGB 12.7 07/22/2022 0749   HCT 39.9 11/17/2023 0932   HCT 39.9 07/22/2022 0749   PLT 305 11/17/2023 0932   PLT 314 07/22/2022  0749   MCV 85.1 11/17/2023 0932   MCV 81 07/22/2022 0749   MCH 27.9 11/17/2023 0932   MCHC 32.8 11/17/2023 0932   RDW 14.5 11/17/2023 0932   RDW 14.4 07/22/2022 0749   Iron Studies    Component Value Date/Time   IRON 32 12/03/2016 1442   TIBC 363 12/03/2016 1442   FERRITIN 13 (L) 02/09/2018 1501   IRONPCTSAT 9 (LL) 12/03/2016 1442   Lipid Panel     Component Value Date/Time   CHOL 138 03/25/2023 0748   TRIG 121 03/25/2023 0748   HDL 44 03/25/2023 0748   CHOLHDL 5.7 (H) 05/15/2021 0929   CHOLHDL 6.3 (H) 01/08/2017 0947   VLDL 40 (H) 01/08/2017 0947   LDLCALC 72 03/25/2023 0748   Hepatic Function Panel     Component Value Date/Time   PROT 9.9 (H) 11/17/2023 0932    PROT 7.7 07/29/2023 0748   ALBUMIN 4.0 11/17/2023 0932   ALBUMIN 4.0 07/29/2023 0748   AST 14 (L) 11/17/2023 0932   ALT 14 11/17/2023 0932   ALKPHOS 45 11/17/2023 0932   BILITOT 0.5 11/17/2023 0932   BILITOT <0.2 07/29/2023 0748      Component Value Date/Time   TSH 1.630 07/22/2022 0749   Nutritional Lab Results  Component Value Date   VD25OH 70.4 07/29/2023   VD25OH 63.8 03/25/2023   VD25OH 56.8 12/18/2022     Assessment and Plan    Obesity with POlyphagia She has adhered to her category two eating plan 85% of the time, resulting in a 21-pound weight loss over the last month. This rapid weight loss may contribute to her nausea and fatigue. Current treatment with Wegovy may exacerbate nausea. The decision was made to reduce the [facility] dose to mitigate symptoms while maintaining pharmacological support for weight management. - Reduce Wegovy dose from 0.5 to 0.25 mg - Encourage hydration with at least 100 ounces of water or equivalent fluids per day - Continue category two eating plan as nausea improves  Nausea She experiences nausea without diarrhea, fever, or chills. The nausea may be multifactorial and dose-dependent, potentially worsened by [facility]. Increased hydration has provided some relief. - Reduce Wegovy dose from 0.5 to 0.25 mg - Encourage hydration with at least 100 ounces of water or equivalent fluids per day - Hold metformin temporarily until symptoms improve - Order lab tests   Fatigue She reports significant fatigue and increased sleep, possibly related to dehydration, rapid weight loss, or other underlying issues. Lab tests are ordered to rule out causes such as anemia or thyroid dysfunction. - Encourage hydration with at least 100 ounces of water or equivalent fluids per day - Order lab tests   Prediabetes She is on metformin for prediabetes management. Due to her symptoms, metformin is held temporarily. The need for metformin will be reassessed once symptoms  resolve. - Hold metformin temporarily until symptoms improve - Reassess the need for metformin once symptoms resolve  Hypertension Her blood pressure is elevated. She has not consistently taken losartan and hydrochlorothiazide, especially when unwell. Resuming these medications is crucial for effective blood pressure management. - Resume losartan and hydrochlorothiazide as prescribed - Monitor blood pressure regularly  Follow-up Close monitoring is required due to recent symptoms and rapid weight loss. A follow-up appointment is scheduled to reassess her condition and adjust treatment as necessary. - Schedule follow-up appointment on April 7th - Maintain previously scheduled appointment on April 21st - Advise to go to the hospital if symptoms worsen despite hydration  I have personally spent 42 minutes total time today in preparation, patient care, and documentation for this visit, including the following: review of clinical lab tests; review of medical tests/procedures/services.    She was informed of the importance of frequent follow up visits to maximize her success with intensive lifestyle modifications for her multiple health conditions.    Quillian Quince, MD

## 2024-01-30 ENCOUNTER — Inpatient Hospital Stay (HOSPITAL_COMMUNITY)
Admission: EM | Admit: 2024-01-30 | Discharge: 2024-02-05 | DRG: 841 | Disposition: A | Discharge status: Home Health | Attending: Internal Medicine | Admitting: Internal Medicine

## 2024-01-30 ENCOUNTER — Inpatient Hospital Stay (HOSPITAL_COMMUNITY)

## 2024-01-30 ENCOUNTER — Other Ambulatory Visit: Payer: Self-pay

## 2024-01-30 ENCOUNTER — Encounter (INDEPENDENT_AMBULATORY_CARE_PROVIDER_SITE_OTHER): Payer: Self-pay | Admitting: Family Medicine

## 2024-01-30 ENCOUNTER — Encounter (HOSPITAL_COMMUNITY): Payer: Self-pay

## 2024-01-30 DIAGNOSIS — J449 Chronic obstructive pulmonary disease, unspecified: Secondary | ICD-10-CM | POA: Diagnosis not present

## 2024-01-30 DIAGNOSIS — Z8249 Family history of ischemic heart disease and other diseases of the circulatory system: Secondary | ICD-10-CM

## 2024-01-30 DIAGNOSIS — Z825 Family history of asthma and other chronic lower respiratory diseases: Secondary | ICD-10-CM

## 2024-01-30 DIAGNOSIS — Z8673 Personal history of transient ischemic attack (TIA), and cerebral infarction without residual deficits: Secondary | ICD-10-CM

## 2024-01-30 DIAGNOSIS — E782 Mixed hyperlipidemia: Secondary | ICD-10-CM | POA: Diagnosis not present

## 2024-01-30 DIAGNOSIS — Z79899 Other long term (current) drug therapy: Secondary | ICD-10-CM

## 2024-01-30 DIAGNOSIS — M8458XA Pathological fracture in neoplastic disease, other specified site, initial encounter for fracture: Secondary | ICD-10-CM | POA: Diagnosis present

## 2024-01-30 DIAGNOSIS — E860 Dehydration: Secondary | ICD-10-CM | POA: Diagnosis present

## 2024-01-30 DIAGNOSIS — J9811 Atelectasis: Secondary | ICD-10-CM | POA: Diagnosis present

## 2024-01-30 DIAGNOSIS — C9000 Multiple myeloma not having achieved remission: Secondary | ICD-10-CM | POA: Diagnosis not present

## 2024-01-30 DIAGNOSIS — Z7985 Long-term (current) use of injectable non-insulin antidiabetic drugs: Secondary | ICD-10-CM | POA: Diagnosis not present

## 2024-01-30 DIAGNOSIS — E785 Hyperlipidemia, unspecified: Secondary | ICD-10-CM | POA: Diagnosis not present

## 2024-01-30 DIAGNOSIS — D630 Anemia in neoplastic disease: Secondary | ICD-10-CM | POA: Diagnosis present

## 2024-01-30 DIAGNOSIS — Z86718 Personal history of other venous thrombosis and embolism: Secondary | ICD-10-CM | POA: Diagnosis not present

## 2024-01-30 DIAGNOSIS — Z7984 Long term (current) use of oral hypoglycemic drugs: Secondary | ICD-10-CM

## 2024-01-30 DIAGNOSIS — E66813 Obesity, class 3: Secondary | ICD-10-CM | POA: Diagnosis not present

## 2024-01-30 DIAGNOSIS — I10 Essential (primary) hypertension: Secondary | ICD-10-CM | POA: Diagnosis present

## 2024-01-30 DIAGNOSIS — E876 Hypokalemia: Secondary | ICD-10-CM | POA: Diagnosis not present

## 2024-01-30 DIAGNOSIS — R5383 Other fatigue: Secondary | ICD-10-CM | POA: Diagnosis not present

## 2024-01-30 DIAGNOSIS — R7303 Prediabetes: Secondary | ICD-10-CM | POA: Diagnosis not present

## 2024-01-30 DIAGNOSIS — Z87891 Personal history of nicotine dependence: Secondary | ICD-10-CM

## 2024-01-30 DIAGNOSIS — M1711 Unilateral primary osteoarthritis, right knee: Secondary | ICD-10-CM | POA: Diagnosis present

## 2024-01-30 DIAGNOSIS — K5900 Constipation, unspecified: Secondary | ICD-10-CM | POA: Diagnosis not present

## 2024-01-30 DIAGNOSIS — Z6841 Body Mass Index (BMI) 40.0 and over, adult: Secondary | ICD-10-CM

## 2024-01-30 DIAGNOSIS — R531 Weakness: Secondary | ICD-10-CM | POA: Diagnosis not present

## 2024-01-30 DIAGNOSIS — Z86711 Personal history of pulmonary embolism: Secondary | ICD-10-CM | POA: Diagnosis not present

## 2024-01-30 DIAGNOSIS — R110 Nausea: Secondary | ICD-10-CM | POA: Diagnosis not present

## 2024-01-30 DIAGNOSIS — Z888 Allergy status to other drugs, medicaments and biological substances status: Secondary | ICD-10-CM

## 2024-01-30 DIAGNOSIS — Z7982 Long term (current) use of aspirin: Secondary | ICD-10-CM | POA: Diagnosis not present

## 2024-01-30 DIAGNOSIS — Z833 Family history of diabetes mellitus: Secondary | ICD-10-CM

## 2024-01-30 LAB — COMPREHENSIVE METABOLIC PANEL WITH GFR
ALT: 13 U/L (ref 0–44)
AST: 17 U/L (ref 15–41)
Albumin: 3.2 g/dL — ABNORMAL LOW (ref 3.5–5.0)
Alkaline Phosphatase: 45 U/L (ref 38–126)
Anion gap: 7 (ref 5–15)
BUN: 18 mg/dL (ref 6–20)
CO2: 34 mmol/L — ABNORMAL HIGH (ref 22–32)
Calcium: >15 mg/dL (ref 8.9–10.3)
Chloride: 95 mmol/L — ABNORMAL LOW (ref 98–111)
Creatinine, Ser: 0.74 mg/dL (ref 0.44–1.00)
GFR, Estimated: >60 mL/min (ref >60)
Glucose, Bld: 104 mg/dL — ABNORMAL HIGH (ref 70–99)
Potassium: 2.3 mmol/L — CL (ref 3.5–5.1)
Sodium: 136 mmol/L (ref 135–145)
Total Bilirubin: 0.8 mg/dL (ref 0.0–1.2)
Total Protein: 9.3 g/dL — ABNORMAL HIGH (ref 6.5–8.1)

## 2024-01-30 LAB — CBC WITH DIFFERENTIAL/PLATELET
Abs Immature Granulocytes: 0.03 10*3/uL (ref 0.00–0.07)
Basophils Absolute: 0 10*3/uL (ref 0.0–0.1)
Basophils Relative: 0 %
Eosinophils Absolute: 0 10*3/uL (ref 0.0–0.5)
Eosinophils Relative: 0 %
HCT: 39.5 % (ref 36.0–46.0)
Hemoglobin: 12.7 g/dL (ref 12.0–15.0)
Immature Granulocytes: 0 %
Lymphocytes Relative: 28 %
Lymphs Abs: 2.6 10*3/uL (ref 0.7–4.0)
MCH: 27 pg (ref 26.0–34.0)
MCHC: 32.2 g/dL (ref 30.0–36.0)
MCV: 83.9 fL (ref 80.0–100.0)
Monocytes Absolute: 0.8 10*3/uL (ref 0.1–1.0)
Monocytes Relative: 8 %
Neutro Abs: 5.9 10*3/uL (ref 1.7–7.7)
Neutrophils Relative %: 64 %
Platelets: 289 10*3/uL (ref 150–400)
RBC: 4.71 MIL/uL (ref 3.87–5.11)
RDW: 13.7 % (ref 11.5–15.5)
WBC: 9.4 10*3/uL (ref 4.0–10.5)
nRBC: 0 % (ref 0.0–0.2)

## 2024-01-30 LAB — PHOSPHORUS: Phosphorus: <1 mg/dL — CL (ref 2.5–4.6)

## 2024-01-30 LAB — MAGNESIUM: Magnesium: 1.5 mg/dL — ABNORMAL LOW (ref 1.7–2.4)

## 2024-01-30 MED ORDER — POTASSIUM CHLORIDE 20 MEQ PO PACK
60.0000 meq | PACK | Freq: Once | ORAL | Status: AC
  Administered 2024-01-31: 60 meq via ORAL
  Filled 2024-01-30: qty 3

## 2024-01-30 MED ORDER — SODIUM CHLORIDE 0.9 % IV BOLUS
1000.0000 mL | Freq: Once | INTRAVENOUS | Status: AC
  Administered 2024-01-30: 1000 mL via INTRAVENOUS

## 2024-01-30 MED ORDER — CALCITONIN (SALMON) 200 UNIT/ML IJ SOLN
400.0000 [IU] | Freq: Two times a day (BID) | INTRAMUSCULAR | Status: DC
  Administered 2024-01-31 (×3): 400 [IU] via INTRAMUSCULAR
  Filled 2024-01-30 (×4): qty 2

## 2024-01-30 MED ORDER — BISACODYL 5 MG PO TBEC: 5.0000 mg | DELAYED_RELEASE_TABLET | Freq: Every day | ORAL | Status: DC | PRN

## 2024-01-30 MED ORDER — LOSARTAN POTASSIUM 50 MG PO TABS
50.0000 mg | ORAL_TABLET | Freq: Every day | ORAL | Status: DC
  Administered 2024-01-31 – 2024-02-05 (×6): 50 mg via ORAL
  Filled 2024-01-30 (×6): qty 1

## 2024-01-30 MED ORDER — POTASSIUM CHLORIDE 10 MEQ/100ML IV SOLN
10.0000 meq | Freq: Once | INTRAVENOUS | Status: AC
  Administered 2024-01-30: 10 meq via INTRAVENOUS
  Filled 2024-01-30: qty 100

## 2024-01-30 MED ORDER — ZOLEDRONIC ACID 4 MG/100ML IV SOLN
4.0000 mg | Freq: Once | INTRAVENOUS | Status: AC
  Administered 2024-01-31: 4 mg via INTRAVENOUS
  Filled 2024-01-30: qty 100

## 2024-01-30 MED ORDER — SODIUM CHLORIDE 0.9 % IV SOLN: INTRAVENOUS | Status: AC

## 2024-01-30 MED ORDER — ROSUVASTATIN CALCIUM 20 MG PO TABS
20.0000 mg | ORAL_TABLET | Freq: Every day | ORAL | Status: DC
  Administered 2024-01-31 – 2024-02-05 (×6): 20 mg via ORAL
  Filled 2024-01-30 (×6): qty 1

## 2024-01-30 MED ORDER — ACETAMINOPHEN 325 MG PO TABS: 650.0000 mg | ORAL_TABLET | Freq: Four times a day (QID) | ORAL | Status: DC | PRN

## 2024-01-30 MED ORDER — ONDANSETRON HCL 4 MG PO TABS: 4.0000 mg | ORAL_TABLET | Freq: Four times a day (QID) | ORAL | Status: DC | PRN

## 2024-01-30 MED ORDER — ONDANSETRON HCL 4 MG/2ML IJ SOLN: 4.0000 mg | Freq: Four times a day (QID) | INTRAMUSCULAR | Status: DC | PRN

## 2024-01-30 MED ORDER — ENOXAPARIN SODIUM 60 MG/0.6ML IJ SOSY
50.0000 mg | PREFILLED_SYRINGE | INTRAMUSCULAR | Status: DC
  Filled 2024-01-30 (×2): qty 0.6

## 2024-01-30 MED ORDER — SENNOSIDES-DOCUSATE SODIUM 8.6-50 MG PO TABS
1.0000 | ORAL_TABLET | Freq: Every evening | ORAL | Status: DC | PRN
  Administered 2024-02-01: 1 via ORAL
  Filled 2024-01-30: qty 1

## 2024-01-30 MED ORDER — POTASSIUM CHLORIDE 10 MEQ/100ML IV SOLN
10.0000 meq | INTRAVENOUS | Status: DC
Start: 2024-01-30 — End: 2024-01-31
  Administered 2024-01-31 (×2): 10 meq via INTRAVENOUS
  Filled 2024-01-30: qty 100

## 2024-01-30 MED ORDER — ALBUTEROL SULFATE (2.5 MG/3ML) 0.083% IN NEBU
2.5000 mg | INHALATION_SOLUTION | Freq: Four times a day (QID) | RESPIRATORY_TRACT | Status: DC | PRN
  Administered 2024-02-04: 2.5 mg via RESPIRATORY_TRACT
  Filled 2024-01-30: qty 3

## 2024-01-30 MED ORDER — ASPIRIN 81 MG PO TBEC
81.0000 mg | DELAYED_RELEASE_TABLET | Freq: Every day | ORAL | Status: DC
  Administered 2024-01-31 – 2024-02-05 (×5): 81 mg via ORAL
  Filled 2024-01-30 (×6): qty 1

## 2024-01-30 MED ORDER — ACETAMINOPHEN 650 MG RE SUPP: 650.0000 mg | Freq: Four times a day (QID) | RECTAL | Status: DC | PRN

## 2024-01-30 NOTE — ED Provider Notes (Signed)
 Patient seen after prior EDP.   Patient with hypercalcemia and hypokalemia. She will require admission for workup and treatment.   Hospitalist service made aware of case.    Wynetta Fines, MD 01/30/24 [phone]

## 2024-01-30 NOTE — ED Notes (Signed)
Update given to daughter via phone.

## 2024-01-30 NOTE — Hospital Course (Signed)
 Carla Hanson is a 59 y.o. 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

## 2024-01-30 NOTE — H&P (Signed)
 History and Physical    Carla Hanson:[id number] DOB: Dec 27, 1965 DOA: 01/30/2024  PCP: Doreene Eland, MD  Patient coming from: Home  I have personally briefly reviewed patient's old medical records in [facility]  Chief Complaint: Abnormal labs  HPI: Carla Hanson is a 58 y.o. 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  Patient had follow-up at the [facility] weight [facility] wellness clinic yesterday 3/27.  She reported feeling lightheaded, dizzy, weak, and dehydrated despite drinking lots of water.  She has some nausea but no emesis.  She reports constipation with last bowel movement 3 days ago.  She has been feeling fatigued.  Labs were obtained and resulted today with significantly elevated calcium of 17.8.  Also noted to have albumin 3.8, total protein 9.1, potassium 2.7.  25-hydroxy vitamin D was 66.9 and TSH 0.736.  Patient was called with critical results and advised to present to the ED for further evaluation.  Patient has been taking ergocalciferol supplement.  Also on HCTZ.  ED Course  Labs/Imaging on admission: I have personally reviewed following labs and imaging studies.  Initial vitals showed BP 163/86, pulse 71, RR 13, temp 98.0 F, SpO2 97% on room air.  Labs showed calcium >15.0, albumin 3.2, total protein 9.3, potassium 2.3, sodium 136, bicarb 34, BUN 18, creatinine 0.74, AST 17, ALT 13, alk phos 45, total bilirubin 0.8, WBC 9.4, hemoglobin 12.7, platelets 289,000.  PTH in process.  Patient was given 1 L normal saline, IV K 35M EQ x 1.  The hospitalist service was consulted to admit.  Review of Systems: All systems reviewed and are negative except as documented in history of present illness above.   Past Medical History:  Diagnosis Date   Alcohol abuse    Anxiety    Back pain    Bipolar  disorder (HCC)    Bronchitis    COPD exacerbation (HCC) 05/10/2016   Drug use    History of blood clots    Hyperlipidemia    Hypertension    Influenza A 12/01/2018   Joint pain    Left leg DVT (HCC) 09/29/2013   Lipoma    Abdomen   LIPOMA 01/20/2008   Obesity    Occasional tremors 05/12/2019   Osteoarthritis of right knee    Other fatigue    Painful menstrual periods 01/05/2012   Pneumonia    Prediabetes    Seizure (HCC)    Shortness of breath on exertion    Stroke (HCC)    Tobacco abuse     Past Surgical History:  Procedure Laterality Date   CESAREAN SECTION     CYSTECTOMY     LIPOMA EXCISION  03/2011   ORIF ANKLE FRACTURE  03/13/2012   Procedure: OPEN REDUCTION INTERNAL FIXATION (ORIF) ANKLE FRACTURE;  Surgeon: Kerrin Champagne, MD;  Location: [facility];  Service: Orthopedics;  Laterality: Right;   TUBAL LIGATION      Social History: Former smoker, quit smoking about 8-10 years ago.  Previously was smoking about 0.5 PPD.  Allergies  Allergen Reactions   Latuda [Lurasidone] Other (See Comments)    Made leg muscles twitch (went off this herself in December, 2017)    Family History  Problem Relation Age of Onset   Other Mother        High blood pressure runs in the family  Obesity Mother    Asthma Father    Heart attack Father    Obesity Father    Diabetes Maternal Aunt    Breast cancer Neg Hx      Prior to Admission medications   Medication Sig Start Date End Date Taking? Authorizing Provider  acetaminophen (TYLENOL) 325 MG tablet Take 650 mg by mouth every 6 (six) hours as needed for mild pain (pain score 1-3).   Yes [provider]  albuterol (PROVENTIL) (2.5 MG/3ML) 0.083% nebulizer solution Take 3 mLs (2.5 mg total) by nebulization every 6 (six) hours as needed for wheezing or shortness of breath. 05/19/23  Yes Doreene Eland, MD  albuterol (VENTOLIN HFA) 108 (90 Base) MCG/ACT inhaler Inhale 2 puffs into the lungs every 6 (six) hours as needed for  wheezing or shortness of breath. 05/19/23  Yes Doreene Eland, MD  aspirin EC 81 MG EC tablet Take 1 tablet (81 mg total) by mouth daily. 11/14/16  Yes Layne Benton, NP  cetirizine (ZYRTEC) 10 MG tablet Take 1 tablet (10 mg total) by mouth daily. 01/23/24  Yes Doreene Eland, MD  ferrous sulfate 325 (65 FE) MG tablet Take 325 mg by mouth daily with breakfast.   Yes [provider]  hydrochlorothiazide (HYDRODIURIL) 25 MG tablet Take 1 tablet (25 mg total) by mouth daily. 01/01/24  Yes Beasley, Caren D, MD  losartan (COZAAR) 50 MG tablet Take 1 tablet (50 mg total) by mouth daily. 01/01/24  Yes Quillian Quince D, MD  metFORMIN (GLUCOPHAGE) 500 MG tablet Take 1 tablet (500 mg total) by mouth 3 (three) times daily. 01/01/24  Yes Beasley, Caren D, MD  rosuvastatin (CRESTOR) 20 MG tablet TAKE 1 TABLET(20 MG) BY MOUTH DAILY 06/30/23  Yes Doreene Eland, MD  Semaglutide-Weight Management (WEGOVY) 0.5 MG/0.5ML SOAJ Inject 0.5 mg into the skin once a week. 01/20/24  Yes Beasley, Caren D, MD  tiZANidine (ZANAFLEX) 4 MG tablet Take 1 tablet (4 mg total) by mouth 3 times daily as needed-between meals & bedtime for muscle spasms. 12/26/23  Yes Doreene Eland, MD  Elastic Bandages & Supports (MUELLER ADJUSTABLE BACK BRACE) MISC Use daily to support thoracic spine 12/04/23   Doreene Eland, MD  Vitamin D, Ergocalciferol, (DRISDOL) 1.25 MG (50000 UNIT) CAPS capsule Over-the-counter.  Take 1 capsule every 14 days. Patient not taking: Reported on 01/30/2024 10/02/22   Adah Salvage, FNP    Physical Exam: Vitals:   01/30/24 1831 01/30/24 1900 01/30/24 1924 01/30/24 2030  BP:  (!) 180/84 (!) 164/72 (!) 165/137  Pulse:  66 69 74  Resp:  17 18 16   Temp: 97.7 F (36.5 C)  97.9 F (36.6 C) 98 F (36.7 C)  TempSrc: Oral  Oral Oral  SpO2:  95% 97% 93%  Weight:      Height:       Constitutional: Obese woman resting in bed, NAD, calm, comfortable Eyes: EOMI, lids and conjunctivae normal ENMT:  Mucous membranes are moist. Posterior pharynx clear of any exudate or lesions.Normal dentition.  Neck: normal, supple, no masses. Respiratory: clear to auscultation bilaterally, no wheezing, no crackles. Normal respiratory effort. No accessory muscle use.  Cardiovascular: Regular rate and rhythm, systolic murmur present. No extremity edema. 2+ pedal pulses. Abdomen: no tenderness, no masses palpated. Musculoskeletal: no clubbing / cyanosis. No joint deformity upper and lower extremities. Good ROM, no contractures. Normal muscle tone.  Skin: no rashes, lesions, ulcers. No induration Neurologic: Sensation intact. Strength 5/5 in all  4.  Psychiatric: Normal judgment and insight. Alert and oriented x 3. Normal mood.   EKG: Personally reviewed. Sinus rhythm, rate 79, QTc 550, motion artifact.  QTc is more prolonged when compared to previous.  Assessment/Plan Principal Problem:   Hypercalcemia Active Problems:   Hypokalemia   Hyperlipidemia   Essential hypertension   History of pulmonary embolism   Pre-diabetes   COPD, moderate (HCC)   Carla Hanson is a 58 y.o. 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  Assessment and Plan: Severe hypercalcemia: Initially noted on outpatient labs with calcium 17.8.  Hypercalcemia confirmed with repeat serum calcium in hospital >15.  PTH in process.  Thiazide diuretic and oral supplements likely contributing to elevated calcium.  25-OH vitamin D level 66.9, TSH 0.736.  She has been symptomatic with GI symptoms and dehydration.  Mentation intact at time of admission. -Continue IV fluid hydration with normal saline overnight -Give IV Zometa 4 mg once -IM calcitonin 400 units twice daily x 4 doses -Holding HCTZ, calcium/vitamin D supplements -Follow PTH, phosphorus, PTHrP -CXR with bibasilar  atelectasis/scarring -Keep on telemetry  Hypokalemia: IV and oral supplementation in process.  Holding HCTZ.  Check magnesium level.  Hypertension: Holding HCTZ.  Continue losartan.  COPD: Stable without wheezing.  Continue albuterol as needed.  History of DVT/PE: Continue aspirin 81 mg daily.  Hyperlipidemia: Continue rosuvastatin.  Prediabetes: Hemoglobin A1c 6.3%.  Holding metformin and Wegovy.   DVT prophylaxis: Lovenox Code Status: Full code, confirmed with patient on admission Family Communication: Discussed with patient, she has discussed with family Disposition Plan: From home and likely return to home pending clinical progress Consults called: None Severity of Illness: The appropriate patient status for this patient is INPATIENT. Inpatient status is judged to be reasonable and necessary in order to provide the required intensity of service to ensure the patient's safety. The patient's presenting symptoms, physical exam findings, and initial radiographic and laboratory data in the context of their chronic comorbidities is felt to place them at high risk for further clinical deterioration. Furthermore, it is not anticipated that the patient will be medically stable for discharge from the hospital within 2 midnights of admission.   * I certify that at the point of admission it is my clinical judgment that the patient will require inpatient hospital care spanning beyond 2 midnights from the point of admission due to high intensity of service, high risk for further deterioration and high frequency of surveillance required.Darreld Mclean MD [facility]  If 7PM-7AM, please contact night-coverage [email or web address]  01/30/2024, 9:01 PM

## 2024-01-30 NOTE — ED Notes (Signed)
 IV start unsuccessful on left hand and right AC using g20

## 2024-01-30 NOTE — ED Provider Notes (Signed)
 [place] EMERGENCY DEPARTMENT AT [facility] Provider Note   CSN: [id number] Arrival date & time: 01/30/24  1426     History  Chief Complaint  Patient presents with   Abnormal Lab    Had routine lab work done yesterday, PCP called today that Calcium level was elevated. Denies N/V, shortness of breath, chest pain. Patient takes Calcium supplement since December 2024 for maintenance.    Carla Hanson is a 58 y.o. female.   Abnormal Lab Patient presents with hypercalcemia.  Had blood work done yesterday for her normal follow-up visit.  Found to have potassium of 2.7 and calcium of 17.8.  Has not had hypercalcemia in the past.  Has had oral supplementation with calcium and vitamin D however. States she may feel little loopy.   Past Medical History:  Diagnosis Date   Alcohol abuse    Anxiety    Back pain    Bipolar disorder (HCC)    Bronchitis    COPD exacerbation (HCC) 05/10/2016   Drug use    History of blood clots    Hyperlipidemia    Hypertension    Influenza A 12/01/2018   Joint pain    Left leg DVT (HCC) 09/29/2013   Lipoma    Abdomen   LIPOMA 01/20/2008   Obesity    Occasional tremors 05/12/2019   Osteoarthritis of right knee    Other fatigue    Painful menstrual periods 01/05/2012   Pneumonia    Prediabetes    Seizure (HCC)    Shortness of breath on exertion    Stroke (HCC)    Tobacco abuse     Home Medications Prior to Admission medications   Medication Sig Start Date End Date Taking? Authorizing Provider  acetaminophen (TYLENOL) 325 MG tablet Take 650 mg by mouth every 6 (six) hours as needed for mild pain (pain score 1-3).    [provider]  albuterol (PROVENTIL) (2.5 MG/3ML) 0.083% nebulizer solution Take 3 mLs (2.5 mg total) by nebulization every 6 (six) hours as needed for wheezing or shortness of breath. 05/19/23   Doreene Eland, MD  albuterol (VENTOLIN HFA) 108 (90 Base) MCG/ACT inhaler Inhale 2 puffs into the lungs every 6  (six) hours as needed for wheezing or shortness of breath. 05/19/23   Doreene Eland, MD  aspirin EC 81 MG EC tablet Take 1 tablet (81 mg total) by mouth daily. 11/14/16   Layne Benton, NP  cetirizine (ZYRTEC) 10 MG tablet Take 1 tablet (10 mg total) by mouth daily. 01/23/24   Doreene Eland, MD  Elastic Bandages & Supports (MUELLER ADJUSTABLE BACK BRACE) MISC Use daily to support thoracic spine 12/04/23   Doreene Eland, MD  ferrous sulfate 325 (65 FE) MG tablet Take 325 mg by mouth daily with breakfast.    [provider]  hydrochlorothiazide (HYDRODIURIL) 25 MG tablet Take 1 tablet (25 mg total) by mouth daily. 01/01/24   Quillian Quince D, MD  losartan (COZAAR) 50 MG tablet Take 1 tablet (50 mg total) by mouth daily. 01/01/24   Quillian Quince D, MD  metFORMIN (GLUCOPHAGE) 500 MG tablet Take 1 tablet (500 mg total) by mouth 3 (three) times daily. 01/01/24   Quillian Quince D, MD  rosuvastatin (CRESTOR) 20 MG tablet TAKE 1 TABLET(20 MG) BY MOUTH DAILY 06/30/23   Doreene Eland, MD  Semaglutide-Weight Management (WEGOVY) 0.5 MG/0.5ML SOAJ Inject 0.5 mg into the skin once a week. 01/20/24   Quillian Quince D,  MD  tiZANidine (ZANAFLEX) 4 MG tablet Take 1 tablet (4 mg total) by mouth 3 times daily as needed-between meals & bedtime for muscle spasms. 12/26/23   Doreene Eland, MD  Vitamin D, Ergocalciferol, (DRISDOL) 1.25 MG (50000 UNIT) CAPS capsule Over-the-counter.  Take 1 capsule every 14 days. 10/02/22   Whitmire, [name], FNP      Allergies    Latuda [lurasidone]    Review of Systems   Review of Systems  Physical Exam Updated Vital Signs BP (!) 163/86   Pulse 68   Temp 98 F (36.7 C) (Oral)   Resp 13   Ht 5\' 2"  (1.575 m)   Wt 114.8 kg   SpO2 97%   BMI 46.27 kg/m  Physical Exam Vitals and nursing note reviewed.  Cardiovascular:     Rate and Rhythm: Normal rate.  Pulmonary:     Breath sounds: No wheezing.  Abdominal:     Tenderness: There is no abdominal tenderness.   Musculoskeletal:        General: No tenderness.  Skin:    Capillary Refill: Capillary refill takes less than 2 seconds.  Neurological:     Mental Status: She is alert and oriented to person, place, and time.     ED Results / Procedures / Treatments   Labs (all labs ordered are listed, but only abnormal results are displayed) Labs Reviewed  COMPREHENSIVE METABOLIC PANEL WITH GFR  PARATHYROID HORMONE, INTACT (NO CA)    EKG EKG Interpretation Date/Time:  Friday January 30 2024 14:34:59 EDT Ventricular Rate:  79 PR Interval:  160 QRS Duration:  107 QT Interval:  479 QTC Calculation: 550 R Axis:   92  Text Interpretation: Sinus rhythm Biatrial enlargement Borderline right axis deviation Repol abnrm, severe global ischemia (LM/MVD) Prolonged QT interval Artifact in lead(s) I II aVR Confirmed by Benjiman Core [phone]) on 01/30/2024 2:56:37 PM  Radiology No results found.  Procedures Procedures    Medications Ordered in ED Medications - No data to display  ED Course/ Medical Decision Making/ A&P                                 Medical Decision Making Amount and/or Complexity of Data Reviewed Labs: ordered.   Patient with lab test from yesterday showing hypercalcemia.  No history of same but has had previous spinal fractures and is on supplementation.  Will recheck lab to see if it is an actual abnormality.  Care turned over to Dr. Rodena Medin somewhat like a shoulder like putting a catheter in with a really retained         Final Clinical Impression(s) / ED Diagnoses Final diagnoses:  None    Rx / DC Orders ED Discharge Orders     None         Benjiman Core, MD 01/30/24 [phone]

## 2024-01-30 NOTE — ED Notes (Signed)
 ED TO INPATIENT HANDOFF REPORT  ED Nurse Name and Phone #: Majel Homer, RN   S Name/Age/Gender Carla Hanson 58 y.o. female Room/Bed: WA15/WA15  Code Status   Code Status: Prior  Home/SNF/Other Home Patient oriented to: self, place, time, and situation Is this baseline? Yes   Triage Complete: Triage complete  Chief Complaint Hypercalcemia [E83.52]  Triage Note No notes on file   Allergies Allergies  Allergen Reactions   Latuda [Lurasidone] Other (See Comments)    Made leg muscles twitch (went off this herself in December, 2017)    Level of Care/Admitting Diagnosis ED Disposition     ED Disposition  Admit   Condition  --   Comment  Hospital Area: [facility] [[id number]]  Level of Care: Telemetry [5]  Admit to tele based on following criteria: Monitor QTC interval  May admit patient to [facility] or [facility] if equivalent level of care is available:: No  Covid Evaluation: Asymptomatic - no recent exposure (last 10 days) testing not required  Diagnosis: Hypercalcemia [275.42.ICD-9-CM]  Admitting Physician: Charlsie Quest [[id number]]  Attending Physician: Charlsie Quest [[id number]]  Certification:: I certify this patient will need inpatient services for at least 2 midnights  Expected Medical Readiness: 02/02/2024          B Medical/Surgery History Past Medical History:  Diagnosis Date   Alcohol abuse    Anxiety    Back pain    Bipolar disorder (HCC)    Bronchitis    COPD exacerbation (HCC) 05/10/2016   Drug use    History of blood clots    Hyperlipidemia    Hypertension    Influenza A 12/01/2018   Joint pain    Left leg DVT (HCC) 09/29/2013   Lipoma    Abdomen   LIPOMA 01/20/2008   Obesity    Occasional tremors 05/12/2019   Osteoarthritis of right knee    Other fatigue    Painful menstrual periods 01/05/2012   Pneumonia    Prediabetes    Seizure (HCC)    Shortness of breath on exertion    Stroke (HCC)    Tobacco abuse     Past Surgical History:  Procedure Laterality Date   CESAREAN SECTION     CYSTECTOMY     LIPOMA EXCISION  03/2011   ORIF ANKLE FRACTURE  03/13/2012   Procedure: OPEN REDUCTION INTERNAL FIXATION (ORIF) ANKLE FRACTURE;  Surgeon: Kerrin Champagne, MD;  Location: [facility];  Service: Orthopedics;  Laterality: Right;   TUBAL LIGATION       A IV Location/Drains/Wounds Patient Lines/Drains/Airways Status     Active Line/Drains/Airways     Name Placement date Placement time Site Days   Peripheral IV 01/30/24 20 G Left Antecubital 01/30/24  1517  Antecubital  less than 1            Intake/Output Last 24 hours No intake or output data in the 24 hours ending 01/30/24 1921  Labs/Imaging Results for orders placed or performed during the hospital encounter of 01/30/24 (from the past 48 hours)  Comprehensive metabolic panel     Status: Abnormal   Collection Time: 01/30/24  3:16 PM  Result Value Ref Range   Sodium 136 135 - 145 mmol/L   Potassium 2.3 (LL) 3.5 - 5.1 mmol/L    Comment: CRITICAL RESULT CALLED TO, READ BACK BY AND VERIFIED WITH GARNER, K RN @ 1617 ON 01/30/24 CAL    Chloride 95 (L) 98 - 111 mmol/L  CO2 34 (H) 22 - 32 mmol/L   Glucose, Bld 104 (H) 70 - 99 mg/dL    Comment: Glucose reference range applies only to samples taken after fasting for at least 8 hours.   BUN 18 6 - 20 mg/dL   Creatinine, Ser 8.11 0.44 - 1.00 mg/dL   Calcium >91.4 (HH) 8.9 - 10.3 mg/dL   Total Protein 9.3 (H) 6.5 - 8.1 g/dL   Albumin 3.2 (L) 3.5 - 5.0 g/dL   AST 17 15 - 41 U/L   ALT 13 0 - 44 U/L   Alkaline Phosphatase 45 38 - 126 U/L   Total Bilirubin 0.8 0.0 - 1.2 mg/dL   GFR, Estimated >78 >29 mL/min    Comment: (NOTE) Calculated using the CKD-EPI Creatinine Equation (2021)    Anion gap 7 5 - 15    Comment: Performed at [facility], [address] [place], [place] [address]  CBC with Differential/Platelet     Status: None   Collection Time: 01/30/24  6:14 PM  Result  Value Ref Range   WBC 9.4 4.0 - 10.5 K/uL   RBC 4.71 3.87 - 5.11 MIL/uL   Hemoglobin 12.7 12.0 - 15.0 g/dL   HCT 08.6 57.8 - 46.9 %   MCV 83.9 80.0 - 100.0 fL   MCH 27.0 26.0 - 34.0 pg   MCHC 32.2 30.0 - 36.0 g/dL   RDW 62.9 52.8 - 41.3 %   Platelets 289 150 - 400 K/uL   nRBC 0.0 0.0 - 0.2 %   Neutrophils Relative % 64 %   Neutro Abs 5.9 1.7 - 7.7 K/uL   Lymphocytes Relative 28 %   Lymphs Abs 2.6 0.7 - 4.0 K/uL   Monocytes Relative 8 %   Monocytes Absolute 0.8 0.1 - 1.0 K/uL   Eosinophils Relative 0 %   Eosinophils Absolute 0.0 0.0 - 0.5 K/uL   Basophils Relative 0 %   Basophils Absolute 0.0 0.0 - 0.1 K/uL   Immature Granulocytes 0 %   Abs Immature Granulocytes 0.03 0.00 - 0.07 K/uL    Comment: Performed at [facility], [address], [place], [place] [address]   *Note: Due to a large number of results and/or encounters for the requested time period, some results have not been displayed. A complete set of results can be found in Results Review.   No results found.  Pending Labs Unresulted Labs (From admission, onward)     Start     Ordered   01/30/24 1630  CBC with Differential  Once,   STAT        01/30/24 1629   01/30/24 1438  Parathyroid hormone, intact (no Ca)  Once,   URGENT        01/30/24 1437            Vitals/Pain Today's Vitals   01/30/24 1730 01/30/24 1815 01/30/24 1831 01/30/24 1900  BP: (!) 183/84 (!) 184/96  (!) 180/84  Pulse: 67 74  66  Resp: 13 16  17   Temp:   97.7 F (36.5 C)   TempSrc:   Oral   SpO2: 95% 96%  95%  Weight:      Height:      PainSc:        Isolation Precautions No active isolations  Medications Medications  sodium chloride 0.9 % bolus 1,000 mL (1,000 mLs Intravenous New Bag/Given 01/30/24 1723)  potassium chloride 10 mEq in 100 mL IVPB (10 mEq Intravenous New Bag/Given  01/30/24 1724)    Mobility walks with device     Focused Assessments See Chart   R Recommendations: See Admitting Provider  Note  Report given to:   Additional Notes: See Chart

## 2024-01-30 NOTE — ED Notes (Signed)
 ED Provider at bedside.

## 2024-01-31 DIAGNOSIS — Z86711 Personal history of pulmonary embolism: Secondary | ICD-10-CM | POA: Diagnosis not present

## 2024-01-31 DIAGNOSIS — J449 Chronic obstructive pulmonary disease, unspecified: Secondary | ICD-10-CM

## 2024-01-31 DIAGNOSIS — E876 Hypokalemia: Secondary | ICD-10-CM

## 2024-01-31 DIAGNOSIS — E782 Mixed hyperlipidemia: Secondary | ICD-10-CM | POA: Diagnosis not present

## 2024-01-31 DIAGNOSIS — I10 Essential (primary) hypertension: Secondary | ICD-10-CM | POA: Diagnosis not present

## 2024-01-31 DIAGNOSIS — R7303 Prediabetes: Secondary | ICD-10-CM | POA: Diagnosis not present

## 2024-01-31 LAB — GLUCOSE, CAPILLARY: Glucose-Capillary: 99 mg/dL (ref 70–99)

## 2024-01-31 LAB — INSULIN, RANDOM: INSULIN: 35.5 u[IU]/mL — ABNORMAL HIGH (ref 2.6–24.9)

## 2024-01-31 LAB — CMP14+EGFR
ALT: 12 IU/L (ref 0–32)
AST: 17 IU/L (ref 0–40)
Albumin: 3.8 g/dL (ref 3.8–4.9)
Alkaline Phosphatase: 59 IU/L (ref 44–121)
BUN/Creatinine Ratio: 21 (ref 9–23)
BUN: 15 mg/dL (ref 6–24)
Bilirubin Total: 0.4 mg/dL (ref 0.0–1.2)
CO2: 29 mmol/L (ref 20–29)
Calcium: 17.8 mg/dL (ref 8.7–10.2)
Chloride: 93 mmol/L — ABNORMAL LOW (ref 96–106)
Creatinine, Ser: 0.7 mg/dL (ref 0.57–1.00)
Globulin, Total: 5.3 g/dL — ABNORMAL HIGH (ref 1.5–4.5)
Glucose: 87 mg/dL (ref 70–99)
Potassium: 2.7 mmol/L — ABNORMAL LOW (ref 3.5–5.2)
Sodium: 139 mmol/L (ref 134–144)
Total Protein: 9.1 g/dL — ABNORMAL HIGH (ref 6.0–8.5)
eGFR: 101 mL/min/{1.73_m2} (ref >59)

## 2024-01-31 LAB — CBC WITH DIFFERENTIAL/PLATELET
Basophils Absolute: 0 10*3/uL (ref 0.0–0.2)
Basos: 1 %
EOS (ABSOLUTE): 0 10*3/uL (ref 0.0–0.4)
Eos: 0 %
Hematocrit: 39.2 % (ref 34.0–46.6)
Hemoglobin: 12.7 g/dL (ref 11.1–15.9)
Immature Grans (Abs): 0 10*3/uL (ref 0.0–0.1)
Immature Granulocytes: 0 %
Lymphocytes Absolute: 2.6 10*3/uL (ref 0.7–3.1)
Lymphs: 29 %
MCH: 27 pg (ref 26.6–33.0)
MCHC: 32.4 g/dL (ref 31.5–35.7)
MCV: 83 fL (ref 79–97)
Monocytes Absolute: 0.7 10*3/uL (ref 0.1–0.9)
Monocytes: 8 %
Neutrophils Absolute: 5.3 10*3/uL (ref 1.4–7.0)
Neutrophils: 62 %
Platelets: 352 10*3/uL (ref 150–450)
RBC: 4.71 x10E6/uL (ref 3.77–5.28)
RDW: 13.7 % (ref 11.7–15.4)
WBC: 8.7 10*3/uL (ref 3.4–10.8)

## 2024-01-31 LAB — RENAL FUNCTION PANEL
Albumin: 2.8 g/dL — ABNORMAL LOW (ref 3.5–5.0)
Anion gap: 8 (ref 5–15)
BUN: 12 mg/dL (ref 6–20)
CO2: 30 mmol/L (ref 22–32)
Calcium: 14.7 mg/dL (ref 8.9–10.3)
Chloride: 102 mmol/L (ref 98–111)
Creatinine, Ser: 0.65 mg/dL (ref 0.44–1.00)
GFR, Estimated: >60 mL/min (ref >60)
Glucose, Bld: 111 mg/dL — ABNORMAL HIGH (ref 70–99)
Phosphorus: <1 mg/dL — CL (ref 2.5–4.6)
Potassium: 2.5 mmol/L — CL (ref 3.5–5.1)
Sodium: 140 mmol/L (ref 135–145)

## 2024-01-31 LAB — CBC
HCT: 38.5 % (ref 36.0–46.0)
Hemoglobin: 11.9 g/dL — ABNORMAL LOW (ref 12.0–15.0)
MCH: 26.7 pg (ref 26.0–34.0)
MCHC: 30.9 g/dL (ref 30.0–36.0)
MCV: 86.5 fL (ref 80.0–100.0)
Platelets: 317 10*3/uL (ref 150–400)
RBC: 4.45 MIL/uL (ref 3.87–5.11)
RDW: 14 % (ref 11.5–15.5)
WBC: 9.9 10*3/uL (ref 4.0–10.5)
nRBC: 0 % (ref 0.0–0.2)

## 2024-01-31 LAB — TSH: TSH: 0.736 u[IU]/mL (ref 0.450–4.500)

## 2024-01-31 LAB — HEMOGLOBIN A1C
Est. average glucose Bld gHb Est-mCnc: 134 mg/dL
Hgb A1c MFr Bld: 6.3 % — ABNORMAL HIGH (ref 4.8–5.6)

## 2024-01-31 LAB — MAGNESIUM: Magnesium: 1.6 mg/dL — ABNORMAL LOW (ref 1.7–2.4)

## 2024-01-31 LAB — CK: Total CK: 93 U/L (ref 32–182)

## 2024-01-31 LAB — VITAMIN B12: Vitamin B-12: 383 pg/mL (ref 232–1245)

## 2024-01-31 LAB — HIV ANTIBODY (ROUTINE TESTING W REFLEX): HIV Screen 4th Generation wRfx: NONREACTIVE

## 2024-01-31 LAB — VITAMIN D 25 HYDROXY (VIT D DEFICIENCY, FRACTURES): Vit D, 25-Hydroxy: 66.9 ng/mL (ref 30.0–100.0)

## 2024-01-31 LAB — PARATHYROID HORMONE, INTACT (NO CA): PTH: 6 pg/mL — ABNORMAL LOW (ref 15–65)

## 2024-01-31 MED ORDER — POTASSIUM PHOSPHATES 15 MMOLE/5ML IV SOLN
30.0000 mmol | Freq: Once | INTRAVENOUS | Status: AC
  Administered 2024-01-31: 30 mmol via INTRAVENOUS
  Filled 2024-01-31: qty 10

## 2024-01-31 MED ORDER — HYDRALAZINE HCL 20 MG/ML IJ SOLN: 10.0000 mg | Freq: Four times a day (QID) | INTRAMUSCULAR | Status: DC | PRN

## 2024-01-31 MED ORDER — MAGNESIUM SULFATE 2 GM/50ML IV SOLN
2.0000 g | Freq: Once | INTRAVENOUS | Status: AC
  Administered 2024-01-31: 2 g via INTRAVENOUS
  Filled 2024-01-31: qty 50

## 2024-01-31 MED ORDER — POTASSIUM CHLORIDE 10 MEQ/100ML IV SOLN: 10.0000 meq | Freq: Once | INTRAVENOUS | Status: DC

## 2024-01-31 MED ORDER — SODIUM CHLORIDE 0.9 % IV SOLN: INTRAVENOUS | Status: AC

## 2024-01-31 MED ORDER — POTASSIUM CHLORIDE 10 MEQ/100ML IV SOLN
10.0000 meq | INTRAVENOUS | Status: AC
  Administered 2024-01-31 (×3): 10 meq via INTRAVENOUS
  Filled 2024-01-31 (×3): qty 100

## 2024-01-31 MED ORDER — POTASSIUM CHLORIDE 10 MEQ/100ML IV SOLN
10.0000 meq | INTRAVENOUS | Status: AC
  Administered 2024-02-01 (×2): 10 meq via INTRAVENOUS
  Filled 2024-01-31 (×2): qty 100

## 2024-01-31 MED ORDER — HYDRALAZINE HCL 25 MG PO TABS
25.0000 mg | ORAL_TABLET | Freq: Three times a day (TID) | ORAL | Status: DC
  Administered 2024-01-31 – 2024-02-05 (×15): 25 mg via ORAL
  Filled 2024-01-31 (×15): qty 1

## 2024-01-31 NOTE — Progress Notes (Signed)
 Called to the room for c/o soreness at the IV site Right forearm. Tender to touch and site with s/sx of infiltration, Removed and notified RN caring for th pt. Cont with plan of care

## 2024-01-31 NOTE — Progress Notes (Signed)
 Mobility Specialist - Progress Note   01/31/24 0836  Mobility  Activity Ambulated with assistance in hallway;Transferred to/from [facility]  Level of Assistance Minimal assist, patient does 75% or more  Assistive Device Front wheel walker  Distance Ambulated (ft) 50 ft  Range of Motion/Exercises Active Assistive  Activity Response Tolerated well  Mobility Referral Yes  Mobility visit 1 Mobility  Mobility Specialist Start Time (ACUTE ONLY) [id number]  Mobility Specialist Stop Time (ACUTE ONLY) 0836  Mobility Specialist Time Calculation (min) (ACUTE ONLY) 17 min   Pt was found in bed and agreeable to ambulate. Grew fatigued with session. At EOS returned to bed with all needs met. Call bell in reach.  Billey Chang Mobility Specialist

## 2024-01-31 NOTE — Progress Notes (Addendum)
 Critical lab results reported to MD (Potassium 2.5, Calcium 14.7 and Phosphorus <1.0)

## 2024-01-31 NOTE — Progress Notes (Signed)
 [facility]  PROGRESS NOTE  Carla Hanson:[id number] DOB: 1966/08/15 DOA: 01/30/2024 PCP: Doreene Eland, MD   Brief HPI:    58 y.o. 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   Patient had follow-up at the [facility] wellness clinic yesterday 3/27.  She reported feeling lightheaded, dizzy, weak, and dehydrated despite drinking lots of water.  She has some nausea but no emesis.  She reports constipation with last bowel movement 3 days ago.  She has been feeling fatigued.   Labs were obtained and resulted today with significantly elevated calcium of 17.8.  Also noted to have albumin 3.8, total protein 9.1, potassium 2.7.  25-hydroxy vitamin D was 66.9 and TSH 0.736.  Patient was called with critical results and advised to present to the ED for further evaluation.   Patient has been taking ergocalciferol supplement.  Also on HCTZ.      Assessment/Plan:   Severe hypercalcemia: Initially noted on outpatient labs with calcium 17.8.  Hypercalcemia confirmed with repeat serum calcium in hospital >15.  PTH in process.  Thiazide diuretic and oral supplements likely contributing to elevated calcium.  25-OH vitamin D level 66.9, TSH 0.736.  She has been symptomatic with GI symptoms and dehydration.  Mentation intact   -Continue IV fluid hydration with normal saline 125 mL/h -She was given  IV Zometa 4 mg once -IM calcitonin 400 units twice daily x 4 doses -Holding HCTZ, calcium/vitamin D supplements -Follow PTH, phosphorus, PTHrP -CXR with bibasilar atelectasis/scarring -Keep on telemetry -Will check UPEP and SPEP   Hypokalemia: -Potassium is low at 2.5 -Will give IV KCl 10 mill equivalents x 5 -Serum magnesium 1.6, will replace magnesium -Follow potassium level in a.m.    Hypertension: Holding HCTZ.   Continue losartan. -Start hydralazine 25 mg p.o. every 8 hours   COPD: Stable without wheezing.  Continue albuterol as needed.   History of DVT/PE: Continue aspirin 81 mg daily.   Hyperlipidemia: Continue rosuvastatin.   Prediabetes: Hemoglobin A1c 6.3%.  Holding metformin and Wegovy. -Check CBG every 6 hours      Medications     aspirin EC  81 mg Oral Daily   calcitonin  400 Units Intramuscular BID   enoxaparin (LOVENOX) injection  50 mg Subcutaneous Q24H   hydrALAZINE  25 mg Oral Q8H   losartan  50 mg Oral Daily   rosuvastatin  20 mg Oral Daily     Data Reviewed:   CBG:  No results for input(s): "GLUCAP" in the last 168 hours.  SpO2: 100 %    Vitals:   01/31/24 0100 01/31/24 0506 01/31/24 0929 01/31/24 1156  BP: (!) 170/78 (!) 187/86 (!) 150/94 (!) 150/81  Pulse: 67 66  60  Resp: 18 19  16   Temp: 98.2 F (36.8 C) 97.7 F (36.5 C) 97.9 F (36.6 C) (!) 97.4 F (36.3 C)  TempSrc: Oral Oral Oral Oral  SpO2: 98% 97% 99% 100%  Weight:      Height:          Data Reviewed:  Basic Metabolic Panel: Recent Labs  Lab 01/29/24 1553 01/30/24 1516 01/30/24 2155 01/31/24 0601 01/31/24 1254  NA 139 136  --  140  --   K 2.7* 2.3*  --  2.5*  --   CL 93* 95*  --  102  --   CO2  29 34*  --  30  --   GLUCOSE 87 104*  --  111*  --   BUN 15 18  --  12  --   CREATININE 0.70 0.74  --  0.65  --   CALCIUM 17.8* >15.0*  --  14.7*  --   MG  --   --  1.5*  --  1.6*  PHOS  --   --  <1.0* <1.0*  --     CBC: Recent Labs  Lab 01/29/24 1553 01/30/24 1814 01/31/24 0601  WBC 8.7 9.4 9.9  NEUTROABS 5.3 5.9  --   HGB 12.7 12.7 11.9*  HCT 39.2 39.5 38.5  MCV 83 83.9 86.5  PLT 352 289 317    LFT Recent Labs  Lab 01/29/24 1553 01/30/24 1516 01/31/24 0601  AST 17 17  --   ALT 12 13  --   ALKPHOS 59 45  --   BILITOT 0.4 0.8  --   PROT 9.1* 9.3*  --   ALBUMIN 3.8 3.2* 2.8*     Antibiotics: Anti-infectives (From admission, onward)    None         DVT prophylaxis: Lovenox  Code Status: Full code  Family Communication: No family at bedside   CONSULTS    Subjective   Denies pain or shortness of breath.   Objective    Physical Examination:   General-appears in no acute distress Heart-S1-S2, regular, no murmur auscultated Lungs-clear to auscultation bilaterally, no wheezing or crackles auscultated Abdomen-soft, nontender, no organomegaly Extremities-no edema in the lower extremities Neuro-alert, oriented x3, no focal deficit noted  Status is: Inpatient:             Meredeth Ide   [facility] If 7PM-7AM, please contact night-coverage at [email or web address], Office  [phone]   01/31/2024, 4:12 PM  LOS: 1 day

## 2024-02-01 DIAGNOSIS — J449 Chronic obstructive pulmonary disease, unspecified: Secondary | ICD-10-CM | POA: Diagnosis not present

## 2024-02-01 DIAGNOSIS — R7303 Prediabetes: Secondary | ICD-10-CM

## 2024-02-01 DIAGNOSIS — E782 Mixed hyperlipidemia: Secondary | ICD-10-CM | POA: Diagnosis not present

## 2024-02-01 DIAGNOSIS — I10 Essential (primary) hypertension: Secondary | ICD-10-CM | POA: Diagnosis not present

## 2024-02-01 DIAGNOSIS — E876 Hypokalemia: Secondary | ICD-10-CM | POA: Diagnosis not present

## 2024-02-01 DIAGNOSIS — Z86711 Personal history of pulmonary embolism: Secondary | ICD-10-CM | POA: Diagnosis not present

## 2024-02-01 LAB — COMPREHENSIVE METABOLIC PANEL WITH GFR
ALT: 14 U/L (ref 0–44)
AST: 15 U/L (ref 15–41)
Albumin: 2.5 g/dL — ABNORMAL LOW (ref 3.5–5.0)
Alkaline Phosphatase: 41 U/L (ref 38–126)
Anion gap: 5 (ref 5–15)
BUN: 12 mg/dL (ref 6–20)
CO2: 31 mmol/L (ref 22–32)
Calcium: 12 mg/dL — ABNORMAL HIGH (ref 8.9–10.3)
Chloride: 102 mmol/L (ref 98–111)
Creatinine, Ser: 0.86 mg/dL (ref 0.44–1.00)
GFR, Estimated: >60 mL/min (ref >60)
Glucose, Bld: 102 mg/dL — ABNORMAL HIGH (ref 70–99)
Potassium: 2.7 mmol/L — CL (ref 3.5–5.1)
Sodium: 138 mmol/L (ref 135–145)
Total Bilirubin: 0.5 mg/dL (ref 0.0–1.2)
Total Protein: 7.9 g/dL (ref 6.5–8.1)

## 2024-02-01 LAB — GLUCOSE, CAPILLARY
Glucose-Capillary: 114 mg/dL — ABNORMAL HIGH (ref 70–99)
Glucose-Capillary: 119 mg/dL — ABNORMAL HIGH (ref 70–99)
Glucose-Capillary: 167 mg/dL — ABNORMAL HIGH (ref 70–99)
Glucose-Capillary: 211 mg/dL — ABNORMAL HIGH (ref 70–99)
Glucose-Capillary: 96 mg/dL (ref 70–99)

## 2024-02-01 LAB — PHOSPHORUS: Phosphorus: 1.4 mg/dL — ABNORMAL LOW (ref 2.5–4.6)

## 2024-02-01 MED ORDER — POTASSIUM CHLORIDE 10 MEQ/100ML IV SOLN
10.0000 meq | INTRAVENOUS | Status: AC
  Administered 2024-02-01 (×3): 10 meq via INTRAVENOUS
  Filled 2024-02-01 (×2): qty 100

## 2024-02-01 MED ORDER — SODIUM CHLORIDE 0.9 % IV SOLN: INTRAVENOUS | Status: AC

## 2024-02-01 MED ORDER — PANTOPRAZOLE SODIUM 40 MG PO TBEC
40.0000 mg | DELAYED_RELEASE_TABLET | Freq: Two times a day (BID) | ORAL | Status: AC
Start: 2024-02-01 — End: 2024-02-04
  Administered 2024-02-01 – 2024-02-04 (×8): 40 mg via ORAL
  Filled 2024-02-01 (×8): qty 1

## 2024-02-01 MED ORDER — CALCITONIN (SALMON) 200 UNIT/ACT NA SOLN
1.0000 | Freq: Every day | NASAL | Status: DC
  Administered 2024-02-01 – 2024-02-04 (×4): 1 via NASAL
  Filled 2024-02-01: qty 3.7

## 2024-02-01 MED ORDER — DEXAMETHASONE 4 MG PO TABS
20.0000 mg | ORAL_TABLET | Freq: Every day | ORAL | Status: AC
  Administered 2024-02-01 – 2024-02-04 (×4): 20 mg via ORAL
  Filled 2024-02-01 (×4): qty 5

## 2024-02-01 MED ORDER — POTASSIUM CHLORIDE 10 MEQ/100ML IV SOLN
INTRAVENOUS | Status: AC
Start: 2024-02-01 — End: 2024-02-01
  Filled 2024-02-01: qty 100

## 2024-02-01 MED ORDER — POTASSIUM PHOSPHATES 15 MMOLE/5ML IV SOLN
30.0000 mmol | Freq: Once | INTRAVENOUS | Status: AC
  Administered 2024-02-01: 30 mmol via INTRAVENOUS
  Filled 2024-02-01: qty 5

## 2024-02-01 NOTE — Progress Notes (Signed)
 Mobility Specialist - Progress Note   02/01/24 0856  Mobility  Activity Transferred to/from [facility];Ambulated with assistance in hallway  Level of Assistance Standby assist, set-up cues, supervision of patient - no hands on  Assistive Device Front wheel walker  Distance Ambulated (ft) 100 ft  Range of Motion/Exercises Active  Activity Response Tolerated well  Mobility Referral Yes  Mobility visit 1 Mobility  Mobility Specialist Start Time (ACUTE ONLY) [id number]  Mobility Specialist Stop Time (ACUTE ONLY) 0856  Mobility Specialist Time Calculation (min) (ACUTE ONLY) 23 min   Pt was found on recliner chair and agreeable to ambulate. Grew fatigued with session. At EOS returned to recliner chair with all needs met. Call bell in reach and RN notified.   Billey Chang Mobility Specialist

## 2024-02-01 NOTE — Progress Notes (Signed)
 24 hour urine contained taken to lab at 1230 as ordered at end time. Bone Survey test ordered- pt refusing to have test completed today. Agrees to have scan done tomorrow AM. Radiology and MD both aware of patient's request.

## 2024-02-01 NOTE — Consult Note (Signed)
 [facility]   HEMATOLOGY/ONCOLOGY CONSULTATION NOTE  Date of Service: 02/01/2024  Patient Care Team: Doreene Eland, MD as PCP - General (Family Medicine) Levert Feinstein, MD as Consulting Physician (Oncology) Micki Riley, MD as Consulting Physician (Neurology) Linna Darner, RD as Dietitian (Family Medicine)  CHIEF COMPLAINTS/PURPOSE OF CONSULTATION:  Hypercalcemia -- concerning for malignant etiology ?Myeloma  HISTORY OF PRESENTING ILLNESS:   Carla Hanson is a wonderful 58 y.o. female who has been referred to [place] by Dr Oneita Hurt for evaluation and management of ***  MEDICAL HISTORY:  Past Medical History:  Diagnosis Date   Alcohol abuse    Anxiety    Back pain    Bipolar disorder (HCC)    Bronchitis    COPD exacerbation (HCC) 05/10/2016   Drug use    History of blood clots    Hyperlipidemia    Hypertension    Influenza A 12/01/2018   Joint pain    Left leg DVT (HCC) 09/29/2013   Lipoma    Abdomen   LIPOMA 01/20/2008   Obesity    Occasional tremors 05/12/2019   Osteoarthritis of right knee    Other fatigue    Painful menstrual periods 01/05/2012   Pneumonia    Prediabetes    Seizure (HCC)    Shortness of breath on exertion    Stroke (HCC)    Tobacco abuse     SURGICAL HISTORY: Past Surgical History:  Procedure Laterality Date   CESAREAN SECTION     CYSTECTOMY     LIPOMA EXCISION  03/2011   ORIF ANKLE FRACTURE  03/13/2012   Procedure: OPEN REDUCTION INTERNAL FIXATION (ORIF) ANKLE FRACTURE;  Surgeon: Kerrin Champagne, MD;  Location: [facility];  Service: Orthopedics;  Laterality: Right;   TUBAL LIGATION      SOCIAL HISTORY: Social History   Socioeconomic History   Marital status: Divorced    Spouse name: Not on file   Number of children: 4   Years of education: 10   Highest education level: Not on file  Occupational History   Occupation: stay at home  Tobacco Use   Smoking status: Former    Current packs/day: 0.00    Average packs/day: 0.5 packs/day for 35.0  years (17.5 ttl pk-yrs)    Types: Cigarettes    Start date: 11/04/1981    Quit date: 10/2016    Years since quitting: 7.3   Smokeless tobacco: Never  Vaping Use   Vaping status: Never Used  Substance and Sexual Activity   Alcohol use: No    Alcohol/week: 0.0 standard drinks of alcohol    Comment: Quit 10/2016   Drug use: Yes    Types: Marijuana   Sexual activity: Not Currently    Comment: tubal  Other Topics Concern   Not on file  Social History Narrative   Unemployed, 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  Considering nursing.    Lives at home alone   Right-handed   Caffeine: not much   Social Drivers of Corporate investment banker Strain: Low Risk  (11/25/2023)   Overall Financial Resource Strain (CARDIA)    Difficulty of Paying Living Expenses: Not hard at all  Food Insecurity: No Food Insecurity (01/31/2024)   Hunger Vital Sign    Worried About Running Out of Food in the Last Year: Never true    Ran Out of Food in the Last Year: Never true  Transportation Needs: No Transportation Needs (01/31/2024)   PRAPARE - Administrator, Civil Service (Medical): No    Lack of Transportation (Non-Medical): No  Physical Activity: Sufficiently Active (11/25/2023)   Exercise Vital Sign    Days of Exercise per Week: 5 days    Minutes of Exercise per Session: 60 min  Stress: No Stress Concern Present (11/25/2023)   [employer] of Occupational Health - Occupational Stress Questionnaire    Feeling of Stress : Not at all  Social Connections: Moderately Integrated (01/31/2024)   Social Connection and Isolation Panel [NHANES]    Frequency of Communication with Friends and Family: Three times a week    Frequency of Social Gatherings with Friends and Family: Once a week    Attends Religious Services: More than 4 times per year    Active Member of [employer] or Organizations: Yes    Attends Tax inspector Meetings: Patient declined    Marital Status: Divorced  Catering manager Violence: Not At Risk (01/31/2024)   Humiliation, Afraid, Rape, and Kick questionnaire    Fear of Current or Ex-Partner: No    Emotionally Abused: No    Physically Abused: No    Sexually Abused: No    FAMILY HISTORY: Family History  Problem Relation Age of Onset   Other Mother        High blood pressure runs in the family   Obesity Mother    Asthma Father    Heart attack Father    Obesity Father    Diabetes Maternal Aunt    Breast cancer Neg Hx     ALLERGIES:  is allergic to latuda [lurasidone].  MEDICATIONS:  Current Facility-Administered Medications  Medication Dose Route Frequency Provider Last Rate Last Admin   0.9 %  sodium chloride infusion   Intravenous Continuous Meredeth Ide, MD 125 mL/hr at 02/01/24 0930 New Bag at 02/01/24 0930   acetaminophen (TYLENOL) tablet 650 mg  650 mg Oral Q6H PRN Charlsie Quest, MD       Or   acetaminophen (TYLENOL) suppository 650 mg  650 mg Rectal Q6H PRN Charlsie Quest, MD       albuterol (PROVENTIL) (2.5 MG/3ML) 0.083% nebulizer solution 2.5 mg  2.5 mg Nebulization Q6H PRN Charlsie Quest, MD       aspirin EC tablet 81 mg  81 mg Oral Daily Charlsie Quest, MD   81 mg at 02/01/24 4098   bisacodyl (DULCOLAX) EC tablet 5 mg  5 mg Oral Daily PRN Charlsie Quest, MD       dexamethasone (DECADRON) tablet 20 mg  20 mg Oral Daily Meredeth Ide, MD   20 mg at 02/01/24 1321   enoxaparin (LOVENOX) injection 50 mg  50 mg Subcutaneous Q24H Darreld Mclean R, MD       hydrALAZINE (APRESOLINE) tablet 25 mg  25 mg Oral Q8H Sharl Ma, Gagan S, MD   25 mg at 02/01/24 1322   losartan (COZAAR) tablet 50 mg  50 mg Oral Daily Darreld Mclean R, MD   50 mg at 02/01/24 0927   ondansetron (ZOFRAN) tablet 4 mg  4 mg Oral Q6H PRN Charlsie Quest, MD       Or   ondansetron (ZOFRAN) injection 4 mg  4 mg Intravenous Q6H PRN Charlsie Quest, MD       pantoprazole (PROTONIX) EC tablet 40  mg  40 mg Oral BID Meredeth Ide, MD   40 mg at 02/01/24  1322   potassium PHOSPHATE 30 mmol in dextrose 5 % 500 mL infusion  30 mmol Intravenous Once Meredeth Ide, MD 85 mL/hr at 02/01/24 1505 30 mmol at 02/01/24 1505   rosuvastatin (CRESTOR) tablet 20 mg  20 mg Oral Daily Charlsie Quest, MD   20 mg at 02/01/24 8295   senna-docusate (Senokot-S) tablet 1 tablet  1 tablet Oral QHS PRN Charlsie Quest, MD        REVIEW OF SYSTEMS:    10 Point review of Systems was done is negative except as noted above.  PHYSICAL EXAMINATION: ECOG PERFORMANCE STATUS: {CHL ONC ECOG AO:[id number]}  . Vitals:   02/01/24 0557 02/01/24 1309  BP: (!) 161/80 (!) 152/82  Pulse: 74 75  Resp: 18 20  Temp: 98.2 F (36.8 C) 98.1 F (36.7 C)  SpO2: 96% 99%   Filed Weights   01/30/24 1437  Weight: 253 lb (114.8 kg)   .Body mass index is 46.27 kg/m.  GENERAL:alert, in no acute distress and comfortable SKIN: no acute rashes, no significant lesions EYES: conjunctiva are pink and non-injected, sclera anicteric OROPHARYNX: MMM, no exudates, no oropharyngeal erythema or ulceration NECK: supple, no JVD LYMPH:  no palpable lymphadenopathy in the cervical, axillary or inguinal regions LUNGS: clear to auscultation b/l with normal respiratory effort HEART: regular rate & rhythm ABDOMEN:  normoactive bowel sounds , non tender, not distended. Extremity: no pedal edema PSYCH: alert & oriented x 3 with fluent speech NEURO: no focal motor/sensory deficits  LABORATORY DATA:  I have reviewed the data as listed  .    Latest Ref Rng & Units 01/31/2024    6:01 AM 01/30/2024    6:14 PM 01/29/2024    3:53 PM  CBC  WBC 4.0 - 10.5 K/uL 9.9  9.4  8.7   Hemoglobin 12.0 - 15.0 g/dL 96.2  95.2  84.1   Hematocrit 36.0 - 46.0 % 38.5  39.5  39.2   Platelets 150 - 400 K/uL 317  289  352     .    Latest Ref Rng & Units 02/01/2024    5:42 AM 01/31/2024    6:01 AM 01/30/2024    3:16 PM  CMP  Glucose 70 - 99 mg/dL 324  401   027   BUN 6 - 20 mg/dL 12  12  18    Creatinine 0.44 - 1.00 mg/dL 2.53  6.64  4.03   Sodium 135 - 145 mmol/L 138  140  136   Potassium 3.5 - 5.1 mmol/L 2.7  2.5  2.3   Chloride 98 - 111 mmol/L 102  102  95   CO2 22 - 32 mmol/L 31  30  34   Calcium 8.9 - 10.3 mg/dL 47.4  25.9  >56.3   Total Protein 6.5 - 8.1 g/dL 7.9   9.3   Total Bilirubin 0.0 - 1.2 mg/dL 0.5   0.8   Alkaline Phos 38 - 126 U/L 41   45   AST 15 - 41 U/L 15   17   ALT 0 - 44 U/L 14   13      RADIOGRAPHIC STUDIES: I have personally reviewed the radiological images as listed and agreed with the findings in the report. DG Chest Port 1 View Result Date: 01/30/2024 CLINICAL DATA:  Hypercalcemia. EXAM: CHEST - 2 VIEW COMPARISON:  11/17/2023 FINDINGS: Bibasilar atelectasis or scarring. Otherwise no focal consolidation, pleural effusion, or pneumothorax. Normal cardiomediastinal silhouette. No displaced rib fractures. Thoracic spondylosis. IMPRESSION:  Bibasilar atelectasis/scarring.  No active cardiopulmonary disease. Electronically Signed   By: Minerva Fester M.D.   On: 01/30/2024 20:21    ASSESSMENT & PLAN:  ***  All of the patients questions were answered with apparent satisfaction. The patient knows to call the clinic with any problems, questions or concerns.  I spent {CHL ONC TIME VISIT - ZOXWR:[id number]} counseling the patient face to face. The total time spent in the appointment was {CHL ONC TIME VISIT - BJYNW:[id number]} and more than 50% was on counseling and direct patient cares.    Wyvonnia Lora MD MS AAHIVMS [facility] Hematology/Oncology Physician [facility]  (Office):       [phone] (Work cell):  [phone] (Fax):           [phone]  02/01/2024 3:16 PM

## 2024-02-01 NOTE — Progress Notes (Signed)
 [facility]  PROGRESS NOTE  Carla FREDERIC Hanson:[id number] DOB: Dec 10, 1965 DOA: 01/30/2024 PCP: Doreene Eland, MD   Brief HPI:    58 y.o. 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   Patient had follow-up at the healthy weight and wellness clinic yesterday 3/27.  She reported feeling lightheaded, dizzy, weak, and dehydrated despite drinking lots of water.  She has some nausea but no emesis.  She reports constipation with last bowel movement 3 days ago.  She has been feeling fatigued.   Labs were obtained and resulted today with significantly elevated calcium of 17.8.  Also noted to have albumin 3.8, total protein 9.1, potassium 2.7.  25-hydroxy vitamin D was 66.9 and TSH 0.736.  Patient was called with critical results and advised to present to the ED for further evaluation.   Patient has been taking ergocalciferol supplement.  Also on HCTZ.      Assessment/Plan:   Severe hypercalcemia: Initially noted on outpatient labs with calcium 17.8.   Hypercalcemia confirmed with repeat serum calcium in hospital >15.  PTH came back low at 6.  Thiazide diuretic and oral supplements likely contributing to elevated calcium.   25-OH vitamin D level 66.9, TSH 0.736.   She has been symptomatic with GI symptoms and dehydration.   Mentation intact   -With low PTH of 6, concern for underlying malignancy.  PTH RP is pending -Will obtain 1/25 dihydroxy vitamin D to rule out underlying lymphoma versus centimeters disease -SPEP and UPEP were ordered yesterday, currently pending -Protein electrophoresis from 3/27 showed globulin 5.3 -Called and discussed with oncologist, Dr. Candise Che, who agrees that this may be due to underlying malignancy.  -Oncology will see in consultation. -Will obtain skeletal survey for metastasis  -Continue IV  fluid hydration with normal saline 125 mL/h -She was given  IV Zometa 4 mg once -IM calcitonin 400 units twice daily x 4 doses -CXR with bibasilar atelectasis/scarring    Hypokalemia: -Potassium is low at 2.7 -Will give IV KCl 10 mill equivalents x 3 -Serum magnesium 1.6, was replaced -Follow potassium level in a.m. -Check serum magnesium in a.m.    Hypertension: Holding HCTZ.  Continue losartan. Started on hydralazine 25 mg p.o. every 8 hours   COPD: Stable without wheezing.  Continue albuterol as needed.   History of DVT/PE: Continue aspirin 81 mg daily.   Hyperlipidemia: Continue rosuvastatin.   Prediabetes: Hemoglobin A1c 6.3%.  Holding metformin and Wegovy. -Check CBG every 6 hours   Hypophosphatemia -Phosphorus is 1.4 -Will replace phosphorus   Medications     aspirin EC  81 mg Oral Daily   dexamethasone  20 mg Oral Daily   enoxaparin (LOVENOX) injection  50 mg Subcutaneous Q24H   hydrALAZINE  25 mg Oral Q8H   losartan  50 mg Oral Daily   pantoprazole  40 mg Oral BID   rosuvastatin  20 mg Oral Daily     Data Reviewed:   CBG:  Recent Labs  Lab 01/31/24 1718 02/01/24 0006 02/01/24 0555 02/01/24 1148  GLUCAP 99 114* 96 119*    SpO2: 99 %    Vitals:   01/31/24 1708 01/31/24 2002 02/01/24 0557 02/01/24 1309  BP: (!) 144/116 (!) 174/77 (!) 161/80 (!) 152/82  Pulse:  84 74 75  Resp:  19 18 20   Temp:  98.1 F (36.7 C) 98.2 F (36.8  C) 98.1 F (36.7 C)  TempSrc:  Oral Oral Oral  SpO2:  97% 96% 99%  Weight:      Height:          Data Reviewed:  Basic Metabolic Panel: Recent Labs  Lab 01/29/24 1553 01/30/24 1516 01/30/24 2155 01/31/24 0601 01/31/24 1254 02/01/24 0542  NA 139 136  --  140  --  138  K 2.7* 2.3*  --  2.5*  --  2.7*  CL 93* 95*  --  102  --  102  CO2 29 34*  --  30  --  31  GLUCOSE 87 104*  --  111*  --  102*  BUN 15 18  --  12  --  12  CREATININE 0.70 0.74  --  0.65  --  0.86  CALCIUM 17.8* >15.0*  --  14.7*   --  12.0*  MG  --   --  1.5*  --  1.6*  --   PHOS  --   --  <1.0* <1.0*  --  1.4*    CBC: Recent Labs  Lab 01/29/24 1553 01/30/24 1814 01/31/24 0601  WBC 8.7 9.4 9.9  NEUTROABS 5.3 5.9  --   HGB 12.7 12.7 11.9*  HCT 39.2 39.5 38.5  MCV 83 83.9 86.5  PLT 352 289 317    LFT Recent Labs  Lab 01/29/24 1553 01/30/24 1516 01/31/24 0601 02/01/24 0542  AST 17 17  --  15  ALT 12 13  --  14  ALKPHOS 59 45  --  41  BILITOT 0.4 0.8  --  0.5  PROT 9.1* 9.3*  --  7.9  ALBUMIN 3.8 3.2* 2.8* 2.5*     Antibiotics: Anti-infectives (From admission, onward)    None        DVT prophylaxis: Lovenox  Code Status: Full code  Family Communication: No family at bedside   CONSULTS    Subjective   Denies any complaints.  Calcium has improved with IV fluids.   Objective    Physical Examination:  General-appears in no acute distress Heart-S1-S2, regular, no murmur auscultated Lungs-clear to auscultation bilaterally, no wheezing or crackles auscultated Abdomen-soft, nontender, no organomegaly Extremities-no edema in the lower extremities Neuro-alert, oriented x3, no focal deficit noted   Status is: Inpatient:             Meredeth Ide   [facility] If 7PM-7AM, please contact night-coverage at [email or web address], Office  [phone]   02/01/2024, 2:09 PM  LOS: 2 days

## 2024-02-02 ENCOUNTER — Telehealth: Payer: Self-pay | Admitting: Family Medicine

## 2024-02-02 ENCOUNTER — Inpatient Hospital Stay (HOSPITAL_COMMUNITY)

## 2024-02-02 ENCOUNTER — Encounter: Payer: Self-pay | Admitting: Family Medicine

## 2024-02-02 DIAGNOSIS — C9000 Multiple myeloma not having achieved remission: Secondary | ICD-10-CM

## 2024-02-02 DIAGNOSIS — E876 Hypokalemia: Secondary | ICD-10-CM | POA: Diagnosis not present

## 2024-02-02 DIAGNOSIS — I10 Essential (primary) hypertension: Secondary | ICD-10-CM | POA: Diagnosis not present

## 2024-02-02 DIAGNOSIS — J449 Chronic obstructive pulmonary disease, unspecified: Secondary | ICD-10-CM | POA: Diagnosis not present

## 2024-02-02 LAB — COMPREHENSIVE METABOLIC PANEL WITH GFR
ALT: 14 U/L (ref 0–44)
AST: 16 U/L (ref 15–41)
Albumin: 2.4 g/dL — ABNORMAL LOW (ref 3.5–5.0)
Alkaline Phosphatase: 39 U/L (ref 38–126)
Anion gap: 8 (ref 5–15)
BUN: 19 mg/dL (ref 6–20)
CO2: 23 mmol/L (ref 22–32)
Calcium: 10.1 mg/dL (ref 8.9–10.3)
Chloride: 105 mmol/L (ref 98–111)
Creatinine, Ser: 0.9 mg/dL (ref 0.44–1.00)
GFR, Estimated: >60 mL/min (ref >60)
Glucose, Bld: 139 mg/dL — ABNORMAL HIGH (ref 70–99)
Potassium: 3 mmol/L — ABNORMAL LOW (ref 3.5–5.1)
Sodium: 136 mmol/L (ref 135–145)
Total Bilirubin: 0.2 mg/dL (ref 0.0–1.2)
Total Protein: 7.3 g/dL (ref 6.5–8.1)

## 2024-02-02 LAB — CBC WITH DIFFERENTIAL/PLATELET
Abs Immature Granulocytes: 0.04 10*3/uL (ref 0.00–0.07)
Basophils Absolute: 0 10*3/uL (ref 0.0–0.1)
Basophils Relative: 0 %
Eosinophils Absolute: 0 10*3/uL (ref 0.0–0.5)
Eosinophils Relative: 0 %
HCT: 32.7 % — ABNORMAL LOW (ref 36.0–46.0)
Hemoglobin: 10.3 g/dL — ABNORMAL LOW (ref 12.0–15.0)
Immature Granulocytes: 1 %
Lymphocytes Relative: 15 %
Lymphs Abs: 1 10*3/uL (ref 0.7–4.0)
MCH: 26.3 pg (ref 26.0–34.0)
MCHC: 31.5 g/dL (ref 30.0–36.0)
MCV: 83.4 fL (ref 80.0–100.0)
Monocytes Absolute: 0.3 10*3/uL (ref 0.1–1.0)
Monocytes Relative: 4 %
Neutro Abs: 5.8 10*3/uL (ref 1.7–7.7)
Neutrophils Relative %: 80 %
Platelets: 243 10*3/uL (ref 150–400)
RBC: 3.92 MIL/uL (ref 3.87–5.11)
RDW: 14.8 % (ref 11.5–15.5)
WBC: 7.1 10*3/uL (ref 4.0–10.5)
nRBC: 0 % (ref 0.0–0.2)

## 2024-02-02 LAB — GLUCOSE, CAPILLARY
Glucose-Capillary: 142 mg/dL — ABNORMAL HIGH (ref 70–99)
Glucose-Capillary: 146 mg/dL — ABNORMAL HIGH (ref 70–99)
Glucose-Capillary: 221 mg/dL — ABNORMAL HIGH (ref 70–99)
Glucose-Capillary: 149 mg/dL — ABNORMAL HIGH (ref 70–99)

## 2024-02-02 LAB — LACTATE DEHYDROGENASE: LDH: 170 U/L (ref 98–192)

## 2024-02-02 LAB — PHOSPHORUS: Phosphorus: 1.9 mg/dL — ABNORMAL LOW (ref 2.5–4.6)

## 2024-02-02 LAB — MAGNESIUM: Magnesium: 1.3 mg/dL — ABNORMAL LOW (ref 1.7–2.4)

## 2024-02-02 MED ORDER — POTASSIUM PHOSPHATES 15 MMOLE/5ML IV SOLN
30.0000 mmol | Freq: Once | INTRAVENOUS | Status: AC
  Administered 2024-02-02: 30 mmol via INTRAVENOUS
  Filled 2024-02-02: qty 10

## 2024-02-02 MED ORDER — POTASSIUM CHLORIDE 20 MEQ PO PACK
40.0000 meq | PACK | Freq: Once | ORAL | Status: AC
  Administered 2024-02-02: 40 meq via ORAL
  Filled 2024-02-02: qty 2

## 2024-02-02 MED ORDER — MAGNESIUM SULFATE 4 GM/100ML IV SOLN
4.0000 g | Freq: Once | INTRAVENOUS | Status: AC
Start: 2024-02-02 — End: 2024-02-02
  Administered 2024-02-02: 4 g via INTRAVENOUS
  Filled 2024-02-02: qty 100

## 2024-02-02 NOTE — Telephone Encounter (Signed)
 [facility] PCP SOCIAL CHECK-UP NOTE Matthieu Loftus,MD I called to check on her - she is currently hospitalized for electrolyte abnormalities. She endorsed feeling better today and had no new concerns. She has an appointment with me tomorrow, but I doubt she will be able to make it. Hence, this was canceled.  I will contact our team to create an 11:30 a.m. appointment for her on 02/10/24 as I am fully booked till April 22nd. She is aware of this and agrees to the plan.

## 2024-02-02 NOTE — Progress Notes (Addendum)
 [facility]  PROGRESS NOTE  Carla Hanson ZOX:[id number] DOB: 02/28/1966 DOA: 01/30/2024 PCP: Doreene Eland, MD   Brief HPI:    58 y.o. 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   Patient had follow-up at the healthy weight and wellness clinic yesterday 3/27.  She reported feeling lightheaded, dizzy, weak, and dehydrated despite drinking lots of water.  She has some nausea but no emesis.  She reports constipation with last bowel movement 3 days ago.  She has been feeling fatigued.   Labs were obtained and resulted today with significantly elevated calcium of 17.8.  Also noted to have albumin 3.8, total protein 9.1, potassium 2.7.  25-hydroxy vitamin D was 66.9 and TSH 0.736.  Patient was called with critical results and advised to present to the ED for further evaluation.   Patient has been taking ergocalciferol supplement.  Also on HCTZ.      Assessment/Plan:   Severe hypercalcemia: Initially noted on outpatient labs with calcium 17.8.   Hypercalcemia confirmed with repeat serum calcium in hospital >15.  PTH came back low at 6.  Thiazide diuretic and oral supplements likely contributing to elevated calcium.   25-OH vitamin D level 66.9, TSH 0.736.   She has been symptomatic with GI symptoms and dehydration.   Mentation intact    -With low PTH of 6, concern for underlying malignancy.  PTH RP is pending -Ordered 1/25 dihydroxy vitamin D to rule out underlying lymphoma versus granulomatous disease -SPEP and UPEP were ordered yesterday, currently pending -Protein electrophoresis from 3/27 showed globulin 5.3 -Called and discussed with oncologist, Dr. Candise Che, who agrees that this may be due to underlying malignancy. -Appreciate oncology input -Will obtain skeletal survey for metastasis  -Continue IV fluid hydration  with normal saline 125 mL/h -She was given  IV Zometa 4 mg once -IM calcitonin 400 units twice daily x 4 doses -CXR with bibasilar atelectasis/scarring -Started Decadron 20 mg p.o. daily for 4 days -Corrected calcium 11.4   Hypokalemia: -Potassium is 3.0 Replace potassium and follow BMP in am  Hypomagnesemia Magnesium 1.3, will give 4 g of magnesium sulfate IV      Hypertension: Holding HCTZ.  Continue losartan. Started on hydralazine 25 mg p.o. every 8 hours   COPD: Stable without wheezing.  Continue albuterol as needed.   History of DVT/PE: Continue aspirin 81 mg daily.   Hyperlipidemia: Continue rosuvastatin.   Prediabetes: Hemoglobin A1c 6.3%.  Holding metformin and Wegovy. -Check CBG every 6 hours   Hypophosphatemia -Phosphorus is 1.9 -Will replace phosphorus   Medications     aspirin EC  81 mg Oral Daily   calcitonin (salmon)  1 spray Alternating Nares Daily   dexamethasone  20 mg Oral Daily   enoxaparin (LOVENOX) injection  50 mg Subcutaneous Q24H   hydrALAZINE  25 mg Oral Q8H   losartan  50 mg Oral Daily   pantoprazole  40 mg Oral BID   potassium chloride  40 mEq Oral Once   rosuvastatin  20 mg Oral Daily     Data Reviewed:   CBG:  Recent Labs  Lab 02/01/24 0555 02/01/24 1148 02/01/24 1750 02/01/24 2352 02/02/24 0603  GLUCAP 96 119* 167* 211* 142*    SpO2: 97 %    Vitals:   02/01/24 0557 02/01/24 1309 02/01/24 2127 02/02/24 0602  BP: (!) 161/80 (!) 152/82 [facility])  155/79 (!) 159/84  Pulse: 74 75 84 65  Resp: 18 20 15 15   Temp: 98.2 F (36.8 C) 98.1 F (36.7 C) 98.2 F (36.8 C) 98.2 F (36.8 C)  TempSrc: Oral Oral Oral Oral  SpO2: 96% 99% 97% 97%  Weight:      Height:          Data Reviewed:  Basic Metabolic Panel: Recent Labs  Lab 01/29/24 1553 01/30/24 1516 01/30/24 2155 01/31/24 0601 01/31/24 1254 02/01/24 0542 02/02/24 0440  NA 139 136  --  140  --  138 136  K 2.7* 2.3*  --  2.5*  --  2.7* 3.0*  CL 93* 95*  --   102  --  102 105  CO2 29 34*  --  30  --  31 23  GLUCOSE 87 104*  --  111*  --  102* 139*  BUN 15 18  --  12  --  12 19  CREATININE 0.70 0.74  --  0.65  --  0.86 0.90  CALCIUM 17.8* >15.0*  --  14.7*  --  12.0* 10.1  MG  --   --  1.5*  --  1.6*  --  1.3*  PHOS  --   --  <1.0* <1.0*  --  1.4* 1.9*    CBC: Recent Labs  Lab 01/29/24 1553 01/30/24 1814 01/31/24 0601 02/02/24 0440  WBC 8.7 9.4 9.9 7.1  NEUTROABS 5.3 5.9  --  5.8  HGB 12.7 12.7 11.9* 10.3*  HCT 39.2 39.5 38.5 32.7*  MCV 83 83.9 86.5 83.4  PLT 352 289 317 243    LFT Recent Labs  Lab 01/29/24 1553 01/30/24 1516 01/31/24 0601 02/01/24 0542 02/02/24 0440  AST 17 17  --  15 16  ALT 12 13  --  14 14  ALKPHOS 59 45  --  41 39  BILITOT 0.4 0.8  --  0.5 0.2  PROT 9.1* 9.3*  --  7.9 7.3  ALBUMIN 3.8 3.2* 2.8* 2.5* 2.4*     Antibiotics: Anti-infectives (From admission, onward)    None        DVT prophylaxis: Lovenox  Code Status: Full code  Family Communication: No family at bedside   CONSULTS    Subjective   Feels better this morning.  Serum calcium down to 10.1.  Appreciate oncology input.   Objective    Physical Examination:  General-appears in no acute distress Heart-S1-S2, regular, no murmur auscultated Lungs-clear to auscultation bilaterally, no wheezing or crackles auscultated Abdomen-soft, nontender, no organomegaly Extremities-no edema in the lower extremities Neuro-alert, oriented x3, no focal deficit noted   Status is: Inpatient:             Meredeth Ide   [facility] If 7PM-7AM, please contact night-coverage at [email or web address], Office  [phone]   02/02/2024, 7:48 AM  LOS: 3 days

## 2024-02-02 NOTE — Progress Notes (Signed)
 [facility]  HEMATOLOGY/ONCOLOGY INPATIENT PROGRESS NOTE  Date of Service: 02/02/2024  Inpatient Attending: .Meredeth Ide, MD   SUBJECTIVE  Patient was seen in hematologic follow-up for suspected multiple myeloma with hypercalcemia of malignancy.  Her calcium levels are improving significantly.  She notes no acute new symptoms.  Improving energy levels.  No nausea or vomiting today.  Electrolytes are progressively improving.  Tolerating her high-dose steroids and calcitonin without any acute issues. She had her bone survey today which resulted later this evening.  I discussed this with her in details and it shows concerns for multiple bone lesions concerning for multiple myeloma.  OBJECTIVE:  NAD  PHYSICAL EXAMINATION: . Vitals:   02/01/24 2127 02/02/24 0602 02/02/24 1345 02/02/24 2002  BP: (!) 155/79 (!) 159/84 137/89 (!) 143/72  Pulse: 84 65 79 79  Resp: 15 15 20 17   Temp: 98.2 F (36.8 C) 98.2 F (36.8 C) 97.6 F (36.4 C) 97.8 F (36.6 C)  TempSrc: Oral Oral Oral Oral  SpO2: 97% 97% 100% 99%  Weight:      Height:       Filed Weights   01/30/24 1437  Weight: 253 lb (114.8 kg)   .Body mass index is 46.27 kg/m.  GENERAL:alert, in no acute distress and comfortable LUNGS: clear to auscultation with normal respiratory effort HEART: regular rate & rhythm,  no murmurs and no lower extremity edema ABDOMEN: abdomen soft, non-tender, normoactive bowel sounds  Musculoskeletal: no cyanosis of digits and no clubbing  PSYCH: alert & oriented x 3 with fluent speech NEURO: no focal motor/sensory deficits  MEDICAL HISTORY:  Past Medical History:  Diagnosis Date   Alcohol abuse    Anxiety    Back pain    Bipolar disorder (HCC)    Bronchitis    COPD exacerbation (HCC) 05/10/2016   Drug use    History of blood clots    Hyperlipidemia    Hypertension    Influenza A 12/01/2018   Joint pain    Left leg DVT (HCC) 09/29/2013   Lipoma    Abdomen   LIPOMA 01/20/2008   Obesity     Occasional tremors 05/12/2019   Osteoarthritis of right knee    Other fatigue    Painful menstrual periods 01/05/2012   Pneumonia    Prediabetes    Seizure (HCC)    Shortness of breath on exertion    Stroke (HCC)    Tobacco abuse     SURGICAL HISTORY: Past Surgical History:  Procedure Laterality Date   CESAREAN SECTION     CYSTECTOMY     LIPOMA EXCISION  03/2011   ORIF ANKLE FRACTURE  03/13/2012   Procedure: OPEN REDUCTION INTERNAL FIXATION (ORIF) ANKLE FRACTURE;  Surgeon: Kerrin Champagne, MD;  Location: [facility];  Service: Orthopedics;  Laterality: Right;   TUBAL LIGATION      SOCIAL HISTORY: Social History   Socioeconomic History   Marital status: Divorced    Spouse name: Not on file   Number of children: 4   Years of education: 10   Highest education level: Not on file  Occupational History   Occupation: stay at home  Tobacco Use   Smoking status: Former    Current packs/day: 0.00    Average packs/day: 0.5 packs/day for 35.0 years (17.5 ttl pk-yrs)    Types: Cigarettes    Start date: 11/04/1981    Quit date: 10/2016    Years since quitting: 7.3   Smokeless tobacco: Never  Vaping Use  Vaping status: Never Used  Substance and Sexual Activity   Alcohol use: No    Alcohol/week: 0.0 standard drinks of alcohol    Comment: Quit 10/2016   Drug use: Yes    Types: Marijuana   Sexual activity: Not Currently    Comment: tubal  Other Topics Concern   Not on file  Social History Narrative   Unemployed, 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  Considering nursing.    Lives at home alone   Right-handed   Caffeine: not much   Social Drivers of Corporate investment banker Strain: Low Risk  (11/25/2023)   Overall Financial Resource Strain (CARDIA)    Difficulty of Paying Living Expenses: Not hard at all  Food Insecurity: No Food Insecurity (01/31/2024)   Hunger Vital Sign    Worried About Running  Out of Food in the Last Year: Never true    Ran Out of Food in the Last Year: Never true  Transportation Needs: No Transportation Needs (01/31/2024)   PRAPARE - Administrator, Civil Service (Medical): No    Lack of Transportation (Non-Medical): No  Physical Activity: Sufficiently Active (11/25/2023)   Exercise Vital Sign    Days of Exercise per Week: 5 days    Minutes of Exercise per Session: 60 min  Stress: No Stress Concern Present (11/25/2023)   [employer] of Occupational Health - Occupational Stress Questionnaire    Feeling of Stress : Not at all  Social Connections: Moderately Integrated (01/31/2024)   Social Connection and Isolation Panel [NHANES]    Frequency of Communication with Friends and Family: Three times a week    Frequency of Social Gatherings with Friends and Family: Once a week    Attends Religious Services: More than 4 times per year    Active Member of [employer] or Organizations: Yes    Attends Banker Meetings: Patient declined    Marital Status: Divorced  Catering manager Violence: Not At Risk (01/31/2024)   Humiliation, Afraid, Rape, and Kick questionnaire    Fear of Current or Ex-Partner: No    Emotionally Abused: No    Physically Abused: No    Sexually Abused: No    FAMILY HISTORY: Family History  Problem Relation Age of Onset   Other Mother        High blood pressure runs in the family   Obesity Mother    Asthma Father    Heart attack Father    Obesity Father    Diabetes Maternal Aunt    Breast cancer Neg Hx     ALLERGIES:  is allergic to latuda [lurasidone].  MEDICATIONS:  Scheduled Meds:  aspirin EC  81 mg Oral Daily   calcitonin (salmon)  1 spray Alternating Nares Daily   dexamethasone  20 mg Oral Daily   enoxaparin (LOVENOX) injection  50 mg Subcutaneous Q24H   hydrALAZINE  25 mg Oral Q8H   losartan  50 mg Oral Daily   pantoprazole  40 mg Oral BID   rosuvastatin  20 mg Oral Daily   Continuous Infusions: PRN  Meds:.acetaminophen **OR** acetaminophen, albuterol, bisacodyl, ondansetron **OR** ondansetron (ZOFRAN) IV, senna-docusate  REVIEW OF SYSTEMS:    10 Point review of Systems was done is negative except as noted above.   LABORATORY DATA:  I have reviewed the data as listed  .    Latest Ref Rng & Units 02/02/2024    4:40  AM 01/31/2024    6:01 AM 01/30/2024    6:14 PM  CBC  WBC 4.0 - 10.5 K/uL 7.1  9.9  9.4   Hemoglobin 12.0 - 15.0 g/dL 40.9  81.1  91.4   Hematocrit 36.0 - 46.0 % 32.7  38.5  39.5   Platelets 150 - 400 K/uL 243  317  289     .    Latest Ref Rng & Units 02/02/2024    4:40 AM 02/01/2024    5:42 AM 01/31/2024    6:01 AM  CMP  Glucose 70 - 99 mg/dL 782  956  213   BUN 6 - 20 mg/dL 19  12  12    Creatinine 0.44 - 1.00 mg/dL 0.86  5.78  4.69   Sodium 135 - 145 mmol/L 136  138  140   Potassium 3.5 - 5.1 mmol/L 3.0  2.7  2.5   Chloride 98 - 111 mmol/L 105  102  102   CO2 22 - 32 mmol/L 23  31  30    Calcium 8.9 - 10.3 mg/dL 62.9  52.8  41.3   Total Protein 6.5 - 8.1 g/dL 7.3  7.9    Total Bilirubin 0.0 - 1.2 mg/dL 0.2  0.5    Alkaline Phos 38 - 126 U/L 39  41    AST 15 - 41 U/L 16  15    ALT 0 - 44 U/L 14  14       RADIOGRAPHIC STUDIES: I have personally reviewed the radiological images as listed and agreed with the findings in the report. DG Bone Survey Met Result Date: 02/02/2024 CLINICAL DATA:  Multiple myeloma EXAM: METASTATIC BONE SURVEY COMPARISON:  01/30/2024 FINDINGS: Standard bone survey was performed: Lateral skull: Multiple rounded lucent lesions are seen throughout the calvarium, greatest in the frontal and parietal regions, consistent with history of multiple myeloma. Upper extremities: Frontal views are obtained from the shoulders through the wrists. Multiple small faint rounded lucent lesions are seen within the proximal humeri bilaterally consistent with multiple myeloma. No acute fractures. Spine: Frontal and lateral views are obtained. The T6 compression  deformity seen previously is again noted and unchanged. There are no new fractures. No destructive bony lesions are identified. Mild multilevel thoracolumbar spondylosis. Chest: Frontal view of the chest demonstrates an unremarkable cardiac silhouette. No airspace disease, effusion, or pneumothorax. Stable bibasilar scarring. There is a lytic lesion with pathologic fracture in the left posterolateral ninth rib compatible with multiple myeloma. No other acute bony abnormalities. Pelvis: Frontal view of the pelvis including both hips demonstrates multiple rounded lucent lesions within the bilateral iliac bones and right inferior pubic ramus. Rounded lucent lesions are also seen within the intertrochanteric region of the proximal left femur. Findings are compatible with multiple myeloma. No pathologic fracture. Lower extremities: Frontal views are obtained from the hips through the ankles. Multiple small rounded lucent lesions are seen within the intertrochanteric left hip, bilateral femoral diaphyses, and right femoral metadiaphyseal region. Indeterminate lucency within the distal fibular diaphysis just proximal to prior ORIF hardware. IMPRESSION: 1. Multiple rounded lucent lesions within the calvarium, left ninth rib, bilateral shoulders, pelvis, and bilateral lower extremities consistent with history of multiple myeloma. 2. Pathologic fracture within the left posterolateral ninth rib at the site of myeloma lesion. Electronically Signed   By: Sharlet Salina M.D.   On: 02/02/2024 15:01   DG Chest Port 1 View Result Date: 01/30/2024 CLINICAL DATA:  Hypercalcemia. EXAM: CHEST - 2 VIEW COMPARISON:  11/17/2023 FINDINGS:  Bibasilar atelectasis or scarring. Otherwise no focal consolidation, pleural effusion, or pneumothorax. Normal cardiomediastinal silhouette. No displaced rib fractures. Thoracic spondylosis. IMPRESSION: Bibasilar atelectasis/scarring.  No active cardiopulmonary disease. Electronically Signed   By: Minerva Fester M.D.   On: 01/30/2024 20:21    ASSESSMENT & PLAN:    58 year old female with severe non-PTH hypercalcemia with an admission calcium level of 17.8    1) Severe non-PTH hypercalcemia of malignancy likely related to Multiple myeloma Calcium level 17.8 on admission much improved with corrected calcium of around 11 PTH appropriately suppressed with a PTH level of 6. Patient has a globulin gap and Anemia and bone survey today shows multiple bone lesions, Overall presentation is certainly concerning for multiple myeloma. Other paraneoplastic hypercalcemia is also possibility. 25-hydroxy vitamin D levels are within normal limits at 66. Status post 1 dose of Zometa  #2 dehydration and volume contraction due to hypercalcemia.   #3  Hypertension #4 dyslipidemia #5 COPD #6 prediabetes #7 multiple electrolyte abnormalities including hypokalemia, hypophosphatemia and severe hypercalcemia. #8 multiple osseous lesions concerning for multiple myeloma.  Also noted to have left femoral intertrochanteric lesion. #9 history of T6 compression fracture was likely related to multiple myeloma.  Mild pain at this time. PLAN -All available lab results were discussed in detail with the patient. -Patient's bone survey today shows extensive bone lesions concerning for multiple myeloma. -Patient was understandably somewhat emotional just to spend some time defining the plan of care and reasoning behind wanting to proceed with the workup and expedited fashion so she can be treated appropriately. -Patient previously with T6 vertebral compression fracture which was likely pathologic related to myeloma.  Not having significant pain at this time. -Patient also has left intertrochanteric femoral lesion.  Might be reasonable to have orthopedics review imaging to determine if this is high risk for fracture to determine need for radiation or prophylactic IM nailing. -Would recommend avoiding stairs or significant  twisting bending or crouching activities to reduce the risk of pathologic fracture in the spine or the femur. -Discussed with patient and if she is agreeable for bone marrow aspiration and biopsy which has been ordered. -IV fluids per hospital medicine along with electrolyte replacements for hypokalemia and hypophosphatemia. -Reasonable to calcitonin intranasally daily at this time . -Continue dexamethasone 20 mg daily for 4 days with GI prophylaxis. -Myeloma workup including myeloma panel, kappa lambda free light chains, LDH, beta-2 microglobulin.  24h UPEP-results still pending. -Will set up for outpatient PET CT scan for myeloma evaluation. -PT OT evaluation to determine discharge needs.  Patient does live by herself but does have supportive family.  Mother is currently living with her for some time. -Appreciate excellent care by hospital medicine Dr Sharl Ma  .The total time spent in the appointment was 50 minutes* .  All of the patient's questions were answered with apparent satisfaction. The patient knows to call the clinic with any problems, questions or concerns.   Wyvonnia Lora MD MS AAHIVMS [facility] Hematology/Oncology Physician [facility]  .*Total Encounter Time as defined by the Centers for Medicare and Medicaid Services includes, in addition to the face-to-face time of a patient visit (documented in the note above) non-face-to-face time: obtaining and reviewing outside history, ordering and reviewing medications, tests or procedures, care coordination (communications with other health care professionals or caregivers) and documentation in the medical record.  02/02/2024 9:10 PM

## 2024-02-02 NOTE — Progress Notes (Signed)
 Mobility Specialist - Progress Note   02/02/24 1547  Mobility  Activity Ambulated with assistance in hallway  Level of Assistance Standby assist, set-up cues, supervision of patient - no hands on  Assistive Device Front wheel walker  Distance Ambulated (ft) 200 ft  Range of Motion/Exercises Active  Activity Response Tolerated well  Mobility Referral Yes  Mobility visit 1 Mobility  Mobility Specialist Start Time (ACUTE ONLY) 1530  Mobility Specialist Stop Time (ACUTE ONLY) 1547  Mobility Specialist Time Calculation (min) (ACUTE ONLY) 17 min   Pt was found sitting EOB and agreeable to ambulate. Grew fatigued with session. At EOS returned to recliner chair with all needs met. Call bell in reach.  Billey Chang Mobility Specialist

## 2024-02-03 ENCOUNTER — Inpatient Hospital Stay (HOSPITAL_COMMUNITY)

## 2024-02-03 ENCOUNTER — Ambulatory Visit: Admitting: Family Medicine

## 2024-02-03 ENCOUNTER — Other Ambulatory Visit: Payer: Self-pay | Admitting: Hematology

## 2024-02-03 DIAGNOSIS — E876 Hypokalemia: Secondary | ICD-10-CM | POA: Diagnosis not present

## 2024-02-03 DIAGNOSIS — J449 Chronic obstructive pulmonary disease, unspecified: Secondary | ICD-10-CM | POA: Diagnosis not present

## 2024-02-03 DIAGNOSIS — M25552 Pain in left hip: Secondary | ICD-10-CM | POA: Diagnosis not present

## 2024-02-03 DIAGNOSIS — C9000 Multiple myeloma not having achieved remission: Secondary | ICD-10-CM | POA: Diagnosis not present

## 2024-02-03 DIAGNOSIS — I10 Essential (primary) hypertension: Secondary | ICD-10-CM | POA: Diagnosis not present

## 2024-02-03 LAB — CBC
HCT: 32.6 % — ABNORMAL LOW (ref 36.0–46.0)
Hemoglobin: 10.4 g/dL — ABNORMAL LOW (ref 12.0–15.0)
MCH: 26.9 pg (ref 26.0–34.0)
MCHC: 31.9 g/dL (ref 30.0–36.0)
MCV: 84.2 fL (ref 80.0–100.0)
Platelets: 262 10*3/uL (ref 150–400)
RBC: 3.87 MIL/uL (ref 3.87–5.11)
RDW: 14.9 % (ref 11.5–15.5)
WBC: 7.6 10*3/uL (ref 4.0–10.5)
nRBC: 0 % (ref 0.0–0.2)

## 2024-02-03 LAB — GLUCOSE, CAPILLARY
Glucose-Capillary: 128 mg/dL — ABNORMAL HIGH (ref 70–99)
Glucose-Capillary: 110 mg/dL — ABNORMAL HIGH (ref 70–99)

## 2024-02-03 LAB — PHOSPHORUS: Phosphorus: 1.6 mg/dL — ABNORMAL LOW (ref 2.5–4.6)

## 2024-02-03 LAB — COMPREHENSIVE METABOLIC PANEL WITH GFR
ALT: 14 U/L (ref 0–44)
AST: 15 U/L (ref 15–41)
Albumin: 2.5 g/dL — ABNORMAL LOW (ref 3.5–5.0)
Alkaline Phosphatase: 39 U/L (ref 38–126)
Anion gap: 6 (ref 5–15)
BUN: 26 mg/dL — ABNORMAL HIGH (ref 6–20)
CO2: 23 mmol/L (ref 22–32)
Calcium: 9.1 mg/dL (ref 8.9–10.3)
Chloride: 108 mmol/L (ref 98–111)
Creatinine, Ser: 0.75 mg/dL (ref 0.44–1.00)
GFR, Estimated: >60 mL/min (ref >60)
Glucose, Bld: 130 mg/dL — ABNORMAL HIGH (ref 70–99)
Potassium: 3.1 mmol/L — ABNORMAL LOW (ref 3.5–5.1)
Sodium: 137 mmol/L (ref 135–145)
Total Bilirubin: <0.2 mg/dL (ref 0.0–1.2)
Total Protein: 7.4 g/dL (ref 6.5–8.1)

## 2024-02-03 LAB — PROTEIN ELECTROPHORESIS, SERUM
A/G Ratio: 0.7 (ref 0.7–1.7)
Albumin ELP: 3.4 g/dL (ref 2.9–4.4)
Alpha-1-Globulin: 0.3 g/dL (ref 0.0–0.4)
Alpha-2-Globulin: 0.8 g/dL (ref 0.4–1.0)
Beta Globulin: 1 g/dL (ref 0.7–1.3)
Gamma Globulin: 3 g/dL — ABNORMAL HIGH (ref 0.4–1.8)
Globulin, Total: 5.1 g/dL — ABNORMAL HIGH (ref 2.2–3.9)
M-Spike, %: 2.7 g/dL — ABNORMAL HIGH
Total Protein ELP: 8.5 g/dL (ref 6.0–8.5)

## 2024-02-03 LAB — MAGNESIUM: Magnesium: 1.8 mg/dL (ref 1.7–2.4)

## 2024-02-03 MED ORDER — FUROSEMIDE 40 MG PO TABS
40.0000 mg | ORAL_TABLET | Freq: Once | ORAL | Status: AC
  Administered 2024-02-03: 40 mg via ORAL
  Filled 2024-02-03: qty 1

## 2024-02-03 MED ORDER — POTASSIUM PHOSPHATES 15 MMOLE/5ML IV SOLN
30.0000 mmol | Freq: Once | INTRAVENOUS | Status: AC
  Administered 2024-02-03: 30 mmol via INTRAVENOUS
  Filled 2024-02-03: qty 10

## 2024-02-03 MED ORDER — POTASSIUM CHLORIDE 20 MEQ PO PACK
40.0000 meq | PACK | ORAL | Status: AC
Start: 2024-02-03 — End: 2024-02-03
  Administered 2024-02-03 (×2): 40 meq via ORAL
  Filled 2024-02-03 (×2): qty 2

## 2024-02-03 NOTE — TOC Initial Note (Signed)
 Transition of Care [facility]) - Initial/Assessment Note    Patient Details  Name: Carla Hanson MRN: [id number] Date of Birth: 1965-11-08  [facility] CM/SW Contact:    Lanier Clam, RN Phone Number: 02/03/2024, 1:13 PM  Clinical Narrative:  d/c plan home.                 Expected Discharge Plan: Home/Self Care Barriers to Discharge: Continued Medical Work up   Patient Goals and CMS Choice Patient states their goals for this hospitalization and ongoing recovery are:: home CMS Medicare.gov Compare Post Acute Care list provided to:: Patient Choice offered to / list presented to : Patient [place] ownership interest in [facility].provided to:: Patient    Expected Discharge Plan and Services   Discharge Planning Services: CM Consult   Living arrangements for the past 2 months: Single Family Home                                      Prior Living Arrangements/Services Living arrangements for the past 2 months: Single Family Home Lives with:: Self                   Activities of Daily Living   ADL Screening (condition at time of admission) Independently performs ADLs?: Yes (appropriate for developmental age) Is the patient deaf or have difficulty hearing?: No Does the patient have difficulty seeing, even when wearing glasses/contacts?: No Does the patient have difficulty concentrating, remembering, or making decisions?: No  Permission Sought/Granted                  Emotional Assessment              Admission diagnosis:  Hypercalcemia [E83.52] Hypokalemia [E87.6] Patient Active Problem List   Diagnosis Date Noted   Hypercalcemia of malignancy 02/02/2024   Multiple myeloma not having achieved remission (HCC) 02/02/2024   Hypercalcemia 01/30/2024   Hypokalemia 01/30/2024   BMI 50.0-59.9, adult (HCC) 12/18/2022   Chronic pain of both shoulders 09/05/2022   Right knee pain 07/18/2022   Vitamin D deficiency 12/19/2020    Palpitation 12/03/2019   History of intracranial hemorrhage 08/02/2019   History of DVT of lower extremity 08/02/2019   History of seizure 08/02/2019   COPD, moderate (HCC) 12/11/2018   Thoracic compression fracture (HCC) 02/11/2018   History of stroke 08/13/2017   Iron deficiency anemia 12/12/2016   Pre-diabetes 03/02/2013   History of pulmonary embolism 05/05/2012   Former tobacco use 08/23/2009   Hyperlipidemia 04/30/2007   Essential hypertension 01/01/2007   PCP:  Doreene Eland, MD Pharmacy:   [facility] [phone] [place], [place] - [address] AT [facility] [address] [place] [address] Phone: [phone] Fax: [phone]  [facility] #[id number] [facility] [place], [place] - [address] AT [facility] [address] [place] [address] Phone: [phone] Fax: [phone]  [facility] [address] [place] [address] Phone: [phone] Fax: [phone]     Social Drivers of Health (SDOH) Social History: SDOH Screenings   Food Insecurity: No Food Insecurity (01/31/2024)  Housing: Low Risk  (01/31/2024)  Transportation Needs: No Transportation Needs (01/31/2024)  Utilities: Not At Risk (01/31/2024)  Depression (PHQ2-9): Low Risk  (12/23/2023)  Financial Resource Strain: Low Risk  (11/25/2023)  Physical Activity: Sufficiently Active (11/25/2023)  Social Connections: Moderately Integrated (01/31/2024)  Stress: No Stress Concern Present (11/25/2023)  Tobacco Use: Medium Risk (01/30/2024)   SDOH Interventions:     Readmission Risk Interventions     No data to display

## 2024-02-03 NOTE — Plan of Care (Signed)
  Problem: Education: Goal: Knowledge of General Education information will improve Description: Including pain rating scale, medication(s)/side effects and non-pharmacologic comfort measures Outcome: Progressing   Problem: Clinical Measurements: Goal: Ability to maintain clinical measurements within normal limits will improve Outcome: Progressing Goal: Will remain free from infection Outcome: Progressing Goal: Diagnostic test results will improve Outcome: Progressing Goal: Respiratory complications will improve Outcome: Progressing Goal: Cardiovascular complication will be avoided Outcome: Progressing   Problem: Health Behavior/Discharge Planning: Goal: Ability to manage health-related needs will improve Outcome: Progressing   Problem: Activity: Goal: Risk for activity intolerance will decrease Outcome: Progressing   Problem: Nutrition: Goal: Adequate nutrition will be maintained Outcome: Progressing   Problem: Coping: Goal: Level of anxiety will decrease Outcome: Progressing   Problem: Elimination: Goal: Will not experience complications related to bowel motility Outcome: Progressing Goal: Will not experience complications related to urinary retention Outcome: Progressing   Problem: Pain Managment: Goal: General experience of comfort will improve and/or be controlled Outcome: Progressing   Problem: Safety: Goal: Ability to remain free from injury will improve Outcome: Progressing

## 2024-02-03 NOTE — Progress Notes (Signed)
 Mobility Specialist - Progress Note   02/03/24 1358  Mobility  Activity Ambulated with assistance in hallway  Level of Assistance Modified independent, requires aide device or extra time  Assistive Device Front wheel walker  Distance Ambulated (ft) 250 ft  Activity Response Tolerated well  Mobility Referral Yes  Mobility visit 1 Mobility  Mobility Specialist Start Time (ACUTE ONLY) 1347  Mobility Specialist Stop Time (ACUTE ONLY) 1358  Mobility Specialist Time Calculation (min) (ACUTE ONLY) 11 min   Pt received EOB and agreeable to mobility. No complaints during session. Pt to EOB after session with all needs met.     [facility]

## 2024-02-03 NOTE — Progress Notes (Signed)
 [facility]  PROGRESS NOTE  Carla Hanson:[id number] DOB: 04-Oct-1966 DOA: 01/30/2024 PCP: Doreene Eland, MD   Brief HPI:    58 y.o. 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   Patient had follow-up at the [facility] yesterday 3/27.  She reported feeling lightheaded, dizzy, weak, and dehydrated despite drinking lots of water.  She has some nausea but no emesis.  She reports constipation with last bowel movement 3 days ago.  She has been feeling fatigued.   Labs were obtained and resulted today with significantly elevated calcium of 17.8.  Also noted to have albumin 3.8, total protein 9.1, potassium 2.7.  25-hydroxy vitamin D was 66.9 and TSH 0.736.  Patient was called with critical results and advised to present to the ED for further evaluation.   Patient has been taking ergocalciferol supplement.  Also on HCTZ.      Assessment/Plan:   ?  Multiple myeloma Initially noted on outpatient labs with calcium 17.8.   Hypercalcemia confirmed with repeat serum calcium in hospital >15.   PTH came back low at 6.  Thiazide diuretic and oral supplements likely contributing to elevated calcium.   25-OH vitamin D level 66.9, TSH 0.736.   She has been symptomatic with GI symptoms and dehydration.   Mentation intact    -With low PTH of 6, concern for underlying malignancy.  PTH RP is pending -Ordered 1/25 dihydroxy vitamin D to rule out underlying lymphoma versus granulomatous disease -SPEP and UPEP were ordered yesterday, currently pending -Protein electrophoresis from 3/27 showed globulin 5.3 -Called and discussed with oncologist, Dr. Candise Che, who agrees that this may be due to underlying malignancy. -Appreciate oncology input -Skeletal survey obtained shows multiple punched-out lesions, consistent  with possible multiple myeloma.  Severe hypercalcemia -Resolved -Received V fluid hydration with normal saline 125 mL/h -She was given  IV Zometa 4 mg once -IM calcitonin 400 units twice daily x 4 doses -CXR with bibasilar atelectasis/scarring -Started Decadron 20 mg p.o. daily for 4 days -Corrected calcium 10.3   Lucency in left intertrochanteric femur -Will obtain left hip x-ray -Based on results, consider orthopedics consultation for possible prophylactic intramedullary nailing  Hypokalemia: -Potassium is 3.1 Replace potassium and follow BMP in am  Hypomagnesemia Replete, 20 magnesium is 1.8  Hypertension: Holding HCTZ.  Continue losartan. Started on hydralazine 25 mg p.o. every 8 hours   COPD: Stable without wheezing.  Continue albuterol as needed.   History of DVT/PE: Continue aspirin 81 mg daily.   Hyperlipidemia: Continue rosuvastatin.   Prediabetes: Hemoglobin A1c 6.3%.  Holding metformin and Wegovy. -Check CBG every 6 hours   Hypophosphatemia -Phosphorus is 1.6 -Will replace phosphorus   Medications     aspirin EC  81 mg Oral Daily   calcitonin (salmon)  1 spray Alternating Nares Daily   dexamethasone  20 mg Oral Daily   enoxaparin (LOVENOX) injection  50 mg Subcutaneous Q24H   hydrALAZINE  25 mg Oral Q8H   losartan  50 mg Oral Daily   pantoprazole  40 mg Oral BID   rosuvastatin  20 mg Oral Daily     Data Reviewed:   CBG:  Recent Labs  Lab 02/02/24 0603 02/02/24 1205 02/02/24 1747 02/02/24 2329 02/03/24 0526  GLUCAP 142* 146* 221* 149* 128*    SpO2: 97 %    Vitals:  02/02/24 0602 02/02/24 1345 02/02/24 2002 02/03/24 0530  BP: (!) 159/84 137/89 (!) 143/72 (!) 142/77  Pulse: 65 79 79 64  Resp: 15 20 17 18   Temp: 98.2 F (36.8 C) 97.6 F (36.4 C) 97.8 F (36.6 C) 98.1 F (36.7 C)  TempSrc: Oral Oral Oral Oral  SpO2: 97% 100% 99% 97%  Weight:      Height:          Data Reviewed:  Basic Metabolic Panel: Recent Labs   Lab 01/30/24 1516 01/30/24 2155 01/31/24 0601 01/31/24 1254 02/01/24 0542 02/02/24 0440 02/03/24 0507  NA 136  --  140  --  138 136 137  K 2.3*  --  2.5*  --  2.7* 3.0* 3.1*  CL 95*  --  102  --  102 105 108  CO2 34*  --  30  --  31 23 23   GLUCOSE 104*  --  111*  --  102* 139* 130*  BUN 18  --  12  --  12 19 26*  CREATININE 0.74  --  0.65  --  0.86 0.90 0.75  CALCIUM >15.0*  --  14.7*  --  12.0* 10.1 9.1  MG  --  1.5*  --  1.6*  --  1.3* 1.8  PHOS  --  <1.0* <1.0*  --  1.4* 1.9*  --     CBC: Recent Labs  Lab 01/29/24 1553 01/30/24 1814 01/31/24 0601 02/02/24 0440 02/03/24 0507  WBC 8.7 9.4 9.9 7.1 7.6  NEUTROABS 5.3 5.9  --  5.8  --   HGB 12.7 12.7 11.9* 10.3* 10.4*  HCT 39.2 39.5 38.5 32.7* 32.6*  MCV 83 83.9 86.5 83.4 84.2  PLT 352 289 317 243 262    LFT Recent Labs  Lab 01/29/24 1553 01/30/24 1516 01/31/24 0601 02/01/24 0542 02/02/24 0440 02/03/24 0507  AST 17 17  --  15 16 15   ALT 12 13  --  14 14 14   ALKPHOS 59 45  --  41 39 39  BILITOT 0.4 0.8  --  0.5 0.2 <0.2  PROT 9.1* 9.3*  --  7.9 7.3 7.4  ALBUMIN 3.8 3.2* 2.8* 2.5* 2.4* 2.5*     Antibiotics: Anti-infectives (From admission, onward)    None        DVT prophylaxis: Lovenox  Code Status: Full code  Family Communication: No family at bedside   CONSULTS    Subjective   Skeletal survey noted to have multiple punched-out lesions.  Congestion with possible multiple myeloma. Complains of mild shortness of breath today.  Objective    Physical Examination:  General-appears in no acute distress Heart-S1-S2, regular, no murmur auscultated Lungs-bibasilar crackles Abdomen-soft, nontender, no organomegaly Extremities-no edema in the lower extremities Neuro-alert, oriented x3, no focal deficit noted   Status is: Inpatient:             Nishtha Raider S Guerino Caporale   [facility] If 7PM-7AM, please contact night-coverage at [email or web address], Office  [phone]   02/03/2024,  8:08 AM  LOS: 4 days

## 2024-02-03 NOTE — Plan of Care (Signed)
  Problem: Coping: Goal: Level of anxiety will decrease Outcome: Progressing   Problem: Pain Managment: Goal: General experience of comfort will improve and/or be controlled Outcome: Progressing   Problem: Safety: Goal: Ability to remain free from injury will improve Outcome: Progressing   Problem: Skin Integrity: Goal: Risk for impaired skin integrity will decrease Outcome: Progressing

## 2024-02-04 ENCOUNTER — Other Ambulatory Visit (HOSPITAL_COMMUNITY): Payer: Self-pay

## 2024-02-04 ENCOUNTER — Inpatient Hospital Stay (HOSPITAL_BASED_OUTPATIENT_CLINIC_OR_DEPARTMENT_OTHER): Admitting: Physical Therapy

## 2024-02-04 ENCOUNTER — Encounter (HOSPITAL_BASED_OUTPATIENT_CLINIC_OR_DEPARTMENT_OTHER): Payer: Self-pay

## 2024-02-04 ENCOUNTER — Telehealth (INDEPENDENT_AMBULATORY_CARE_PROVIDER_SITE_OTHER): Payer: Self-pay | Admitting: Family Medicine

## 2024-02-04 DIAGNOSIS — E876 Hypokalemia: Secondary | ICD-10-CM | POA: Diagnosis not present

## 2024-02-04 DIAGNOSIS — C9000 Multiple myeloma not having achieved remission: Secondary | ICD-10-CM | POA: Diagnosis not present

## 2024-02-04 LAB — CBC WITH DIFFERENTIAL/PLATELET
Abs Immature Granulocytes: 0.03 10*3/uL (ref 0.00–0.07)
Basophils Absolute: 0 10*3/uL (ref 0.0–0.1)
Basophils Relative: 0 %
Eosinophils Absolute: 0 10*3/uL (ref 0.0–0.5)
Eosinophils Relative: 0 %
HCT: 34.6 % — ABNORMAL LOW (ref 36.0–46.0)
Hemoglobin: 11 g/dL — ABNORMAL LOW (ref 12.0–15.0)
Immature Granulocytes: 0 %
Lymphocytes Relative: 19 %
Lymphs Abs: 1.6 10*3/uL (ref 0.7–4.0)
MCH: 27 pg (ref 26.0–34.0)
MCHC: 31.8 g/dL (ref 30.0–36.0)
MCV: 84.8 fL (ref 80.0–100.0)
Monocytes Absolute: 0.7 10*3/uL (ref 0.1–1.0)
Monocytes Relative: 8 %
Neutro Abs: 6.3 10*3/uL (ref 1.7–7.7)
Neutrophils Relative %: 73 %
Platelets: 306 10*3/uL (ref 150–400)
RBC: 4.08 MIL/uL (ref 3.87–5.11)
RDW: 15.2 % (ref 11.5–15.5)
WBC: 8.6 10*3/uL (ref 4.0–10.5)
nRBC: 0 % (ref 0.0–0.2)

## 2024-02-04 LAB — COMPREHENSIVE METABOLIC PANEL WITH GFR
ALT: 38 U/L (ref 0–44)
AST: 32 U/L (ref 15–41)
Albumin: 2.6 g/dL — ABNORMAL LOW (ref 3.5–5.0)
Alkaline Phosphatase: 41 U/L (ref 38–126)
Anion gap: 5 (ref 5–15)
BUN: 28 mg/dL — ABNORMAL HIGH (ref 6–20)
CO2: 23 mmol/L (ref 22–32)
Calcium: 8.4 mg/dL — ABNORMAL LOW (ref 8.9–10.3)
Chloride: 108 mmol/L (ref 98–111)
Creatinine, Ser: 0.8 mg/dL (ref 0.44–1.00)
GFR, Estimated: >60 mL/min (ref >60)
Glucose, Bld: 117 mg/dL — ABNORMAL HIGH (ref 70–99)
Potassium: 3.7 mmol/L (ref 3.5–5.1)
Sodium: 136 mmol/L (ref 135–145)
Total Bilirubin: 0.4 mg/dL (ref 0.0–1.2)
Total Protein: 7.5 g/dL (ref 6.5–8.1)

## 2024-02-04 LAB — CANCER ANTIGEN 15-3: CA 15-3: 13.6 U/mL (ref 0.0–25.0)

## 2024-02-04 LAB — GLUCOSE, CAPILLARY
Glucose-Capillary: 110 mg/dL — ABNORMAL HIGH (ref 70–99)
Glucose-Capillary: 135 mg/dL — ABNORMAL HIGH (ref 70–99)
Glucose-Capillary: 237 mg/dL — ABNORMAL HIGH (ref 70–99)

## 2024-02-04 LAB — KAPPA/LAMBDA LIGHT CHAINS
Kappa free light chain: 9.9 mg/L (ref 3.3–19.4)
Kappa, lambda light chain ratio: 0.05 — ABNORMAL LOW (ref 0.26–1.65)
Lambda free light chains: 203.1 mg/L — ABNORMAL HIGH (ref 5.7–26.3)

## 2024-02-04 LAB — PHOSPHORUS: Phosphorus: 1.8 mg/dL — ABNORMAL LOW (ref 2.5–4.6)

## 2024-02-04 LAB — CALCITRIOL (1,25 DI-OH VIT D): Vit D, 1,25-Dihydroxy: 7.2 pg/mL — ABNORMAL LOW (ref 24.8–81.5)

## 2024-02-04 MED ORDER — K PHOS MONO-SOD PHOS DI & MONO 155-852-130 MG PO TABS
500.0000 mg | ORAL_TABLET | Freq: Three times a day (TID) | ORAL | Status: DC
  Administered 2024-02-04 – 2024-02-05 (×3): 500 mg via ORAL
  Filled 2024-02-04 (×4): qty 2

## 2024-02-04 NOTE — TOC Transition Note (Addendum)
 Transition of Care [facility]) - Discharge Note   Patient Details  Name: Carla Hanson MRN: [id number] Date of Birth: 07/17/1966  [facility] CM/SW Contact:  Lanier Clam, RN Phone Number: 02/04/2024, 9:30 AM   Clinical Narrative: Awaiting PT eval & recc. HHC agency [facility] rep Arta Bruce of care Monday 02/09/24-patient in agreement. Patient is asking for rw or rollator-informed patient once PT eval & make recc I can asst w/ HHC & dme. Has own transport.  -dme [employer] rep Jermaine no preference rollator;3n1 to be delivered to rm prior d/c.       Final next level of care: Home w Home Health Services Barriers to Discharge: No Barriers Identified   Patient Goals and CMS Choice Patient states their goals for this hospitalization and ongoing recovery are:: Home CMS Medicare.gov Compare Post Acute Care list provided to:: Patient Choice offered to / list presented to : Patient [place] ownership interest in [facility].provided to:: Patient    Discharge Placement                       Discharge Plan and Services Additional resources added to the After Visit Summary for     Discharge Planning Services: CM Consult                      HH Arranged: PT [facility] Agency: [facility] Date [facility] Agency Contacted: 02/04/24 Time HH Agency Contacted: 0930 Representative spoke with at [facility] Agency: Clifton Custard  Social Drivers of Health (SDOH) Interventions SDOH Screenings   Food Insecurity: No Food Insecurity (01/31/2024)  Housing: Low Risk  (01/31/2024)  Transportation Needs: No Transportation Needs (01/31/2024)  Utilities: Not At Risk (01/31/2024)  Depression (PHQ2-9): Low Risk  (12/23/2023)  Financial Resource Strain: Low Risk  (11/25/2023)  Physical Activity: Sufficiently Active (11/25/2023)  Social Connections: Moderately Integrated (01/31/2024)  Stress: No Stress Concern Present (11/25/2023)  Tobacco Use: Medium Risk (01/30/2024)     Readmission Risk  Interventions    02/03/2024    1:14 PM  Readmission Risk Prevention Plan  Transportation Screening Complete  PCP or Specialist Appt within 3-5 Days Complete  Social Work Consult for Recovery Care Planning/Counseling Complete  Palliative Care Screening Not Applicable  Medication Review [employer]) Complete

## 2024-02-04 NOTE — Plan of Care (Signed)
   Problem: Education: Goal: Knowledge of General Education information will improve Description: Including pain rating scale, medication(s)/side effects and non-pharmacologic comfort measures Outcome: Progressing   Problem: Health Behavior/Discharge Planning: Goal: Ability to manage health-related needs will improve Outcome: Progressing   Problem: Clinical Measurements: Goal: Ability to maintain clinical measurements within normal limits will improve Outcome: Progressing Goal: Diagnostic test results will improve Outcome: Progressing   Problem: Coping: Goal: Level of anxiety will decrease Outcome: Progressing

## 2024-02-04 NOTE — Consult Note (Signed)
 Chief Complaint: Hypercalcemia of malignancy, possible multiple myeloma - IR consulted for CT bone marrow biopsy  Referring Provider(s): Dr. Wyvonnia Lora  Supervising Physician: Oley Balm  Patient Status: [facility] - In-pt  History of Present Illness: Carla Hanson is a 58 y.o. 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 Had bone survey 02/02/24 with findings:  1. Multiple rounded lucent lesions within the calvarium, left ninth rib, bilateral shoulders, pelvis, and bilateral lower extremities consistent with history of multiple myeloma. 2. Pathologic fracture within the left posterolateral ninth rib at the site of myeloma lesion. IR consulted for CT guided bone marrow biopsy due to concern for hypercalcemia of malignancy/multiple myeloma.   Patient is Full Code  Past Medical History:  Diagnosis Date   Alcohol abuse    Anxiety    Back pain    Bipolar disorder (HCC)    Bronchitis    COPD exacerbation (HCC) 05/10/2016   Drug use    History of blood clots    Hyperlipidemia    Hypertension    Influenza A 12/01/2018   Joint pain    Left leg DVT (HCC) 09/29/2013   Lipoma    Abdomen   LIPOMA 01/20/2008   Obesity    Occasional tremors 05/12/2019   Osteoarthritis of right knee    Other fatigue    Painful menstrual periods 01/05/2012   Pneumonia    Prediabetes    Seizure (HCC)    Shortness of breath on exertion    Stroke (HCC)    Tobacco abuse     Past Surgical History:  Procedure Laterality Date   CESAREAN SECTION     CYSTECTOMY     LIPOMA EXCISION  03/2011   ORIF ANKLE FRACTURE  03/13/2012   Procedure: OPEN REDUCTION INTERNAL FIXATION (ORIF) ANKLE FRACTURE;  Surgeon: Kerrin Champagne, MD;  Location: [facility]S;  Service: Orthopedics;  Laterality: Right;   TUBAL LIGATION      Allergies: Latuda [lurasidone]  Medications: Prior  to Admission medications   Medication Sig Start Date End Date Taking? Authorizing Provider  acetaminophen (TYLENOL) 325 MG tablet Take 650 mg by mouth every 6 (six) hours as needed for mild pain (pain score 1-3).   Yes [provider]  albuterol (PROVENTIL) (2.5 MG/3ML) 0.083% nebulizer solution Take 3 mLs (2.5 mg total) by nebulization every 6 (six) hours as needed for wheezing or shortness of breath. 05/19/23  Yes Doreene Eland, MD  albuterol (VENTOLIN HFA) 108 (90 Base) MCG/ACT inhaler Inhale 2 puffs into the lungs every 6 (six) hours as needed for wheezing or shortness of breath. 05/19/23  Yes Doreene Eland, MD  aspirin EC 81 MG EC tablet Take 1 tablet (81 mg total) by mouth daily. 11/14/16  Yes Layne Benton, NP  cetirizine (ZYRTEC) 10 MG tablet Take 1 tablet (10 mg total) by mouth daily. 01/23/24  Yes Doreene Eland, MD  ferrous sulfate 325 (65 FE) MG tablet Take 325 mg by mouth daily with breakfast.   Yes [provider]  hydrochlorothiazide (HYDRODIURIL) 25 MG tablet Take 1 tablet (25 mg total) by mouth daily. 01/01/24  Yes Beasley, Caren D, MD  losartan (COZAAR) 50 MG tablet Take 1 tablet (50 mg total) by mouth daily. 01/01/24  Yes Beasley, Caren D, MD  metFORMIN (GLUCOPHAGE) 500 MG tablet Take 1 tablet (500 mg total) by mouth  3 (three) times daily. 01/01/24  Yes Beasley, Caren D, MD  rosuvastatin (CRESTOR) 20 MG tablet TAKE 1 TABLET(20 MG) BY MOUTH DAILY 06/30/23  Yes Doreene Eland, MD  Semaglutide-Weight Management (WEGOVY) 0.5 MG/0.5ML SOAJ Inject 0.5 mg into the skin once a week. 01/20/24  Yes Beasley, Caren D, MD  tiZANidine (ZANAFLEX) 4 MG tablet Take 1 tablet (4 mg total) by mouth 3 times daily as needed-between meals & bedtime for muscle spasms. 12/26/23  Yes Doreene Eland, MD  Elastic Bandages & Supports (MUELLER ADJUSTABLE BACK BRACE) MISC Use daily to support thoracic spine 12/04/23   Doreene Eland, MD     Family History  Problem Relation Age of  Onset   Other Mother        High blood pressure runs in the family   Obesity Mother    Asthma Father    Heart attack Father    Obesity Father    Diabetes Maternal Aunt    Breast cancer Neg Hx     Social History   Socioeconomic History   Marital status: Divorced    Spouse name: Not on file   Number of children: 4   Years of education: 10   Highest education level: Not on file  Occupational History   Occupation: stay at home  Tobacco Use   Smoking status: Former    Current packs/day: 0.00    Average packs/day: 0.5 packs/day for 35.0 years (17.5 ttl pk-yrs)    Types: Cigarettes    Start date: 11/04/1981    Quit date: 10/2016    Years since quitting: 7.3   Smokeless tobacco: Never  Vaping Use   Vaping status: Never Used  Substance and Sexual Activity   Alcohol use: No    Alcohol/week: 0.0 standard drinks of alcohol    Comment: Quit 10/2016   Drug use: Yes    Types: Marijuana   Sexual activity: Not Currently    Comment: tubal  Other Topics Concern   Not on file  Social History Narrative   Unemployed, 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  Considering nursing.    Lives at home alone   Right-handed   Caffeine: not much   Social Drivers of Corporate investment banker Strain: Low Risk  (11/25/2023)   Overall Financial Resource Strain (CARDIA)    Difficulty of Paying Living Expenses: Not hard at all  Food Insecurity: No Food Insecurity (01/31/2024)   Hunger Vital Sign    Worried About Running Out of Food in the Last Year: Never true    Ran Out of Food in the Last Year: Never true  Transportation Needs: No Transportation Needs (01/31/2024)   PRAPARE - Administrator, Civil Service (Medical): No    Lack of Transportation (Non-Medical): No  Physical Activity: Sufficiently Active (11/25/2023)   Exercise Vital Sign    Days of Exercise per Week: 5 days    Minutes of Exercise per  Session: 60 min  Stress: No Stress Concern Present (11/25/2023)   [employer] of Occupational Health - Occupational Stress Questionnaire    Feeling of Stress : Not at all  Social Connections: Moderately Integrated (01/31/2024)   Social Connection and Isolation Panel [NHANES]    Frequency of Communication with Friends and Family: Three times a week    Frequency of Social Gatherings with Friends and Family: Once a week    Attends Religious Services: More  than 4 times per year    Active Member of Clubs or Organizations: Yes    Attends Banker Meetings: Patient declined    Marital Status: Divorced     Review of Systems: A 12 point ROS discussed and pertinent positives are indicated in the HPI above.  All other systems are negative.    Vital Signs: BP 136/83 (BP Location: Right Wrist)   Pulse 71   Temp (!) 97.4 F (36.3 C) (Oral)   Resp (!) 21   Ht 5\' 2"  (1.575 m)   Wt 253 lb (114.8 kg)   SpO2 99%   BMI 46.27 kg/m   Advance Care Plan: No documents on file  Physical Exam Vitals and nursing note reviewed.  Constitutional:      Appearance: Normal appearance. She is not ill-appearing or toxic-appearing.  HENT:     Mouth/Throat:     Mouth: Mucous membranes are moist.     Pharynx: Oropharynx is clear.  Cardiovascular:     Rate and Rhythm: Normal rate and regular rhythm.  Pulmonary:     Effort: Pulmonary effort is normal.     Breath sounds: Normal breath sounds.  Abdominal:     Palpations: Abdomen is soft.     Tenderness: There is no abdominal tenderness.  Musculoskeletal:     Right lower leg: No edema.     Left lower leg: No edema.  Skin:    General: Skin is warm and dry.  Neurological:     General: No focal deficit present.     Mental Status: She is alert and oriented to person, place, and time. Mental status is at baseline.     Imaging: DG HIP UNILAT WITH PELVIS 2-3 VIEWS LEFT Result Date: 02/03/2024 CLINICAL DATA:  Pain after injury.  History of  myeloma. EXAM: DG HIP (WITH OR WITHOUT PELVIS) 3V LEFT COMPARISON:  X-ray 02/02/2024 FINDINGS: Today's examination is more under penetrated than previous. The subtle lucent lesions are again noted diffusely along the pelvis and proximal femurs consistent with known history of myeloma. Slight joint space loss of the right hip. No fracture or dislocation. IMPRESSION: Multiple lucent lesions as per prior examination. Patient has provided history of multiple myeloma. Electronically Signed   By: Karen Kays M.D.   On: 02/03/2024 19:25   DG Bone Survey Met Result Date: 02/02/2024 CLINICAL DATA:  Multiple myeloma EXAM: METASTATIC BONE SURVEY COMPARISON:  01/30/2024 FINDINGS: Standard bone survey was performed: Lateral skull: Multiple rounded lucent lesions are seen throughout the calvarium, greatest in the frontal and parietal regions, consistent with history of multiple myeloma. Upper extremities: Frontal views are obtained from the shoulders through the wrists. Multiple small faint rounded lucent lesions are seen within the proximal humeri bilaterally consistent with multiple myeloma. No acute fractures. Spine: Frontal and lateral views are obtained. The T6 compression deformity seen previously is again noted and unchanged. There are no new fractures. No destructive bony lesions are identified. Mild multilevel thoracolumbar spondylosis. Chest: Frontal view of the chest demonstrates an unremarkable cardiac silhouette. No airspace disease, effusion, or pneumothorax. Stable bibasilar scarring. There is a lytic lesion with pathologic fracture in the left posterolateral ninth rib compatible with multiple myeloma. No other acute bony abnormalities. Pelvis: Frontal view of the pelvis including both hips demonstrates multiple rounded lucent lesions within the bilateral iliac bones and right inferior pubic ramus. Rounded lucent lesions are also seen within the intertrochanteric region of the proximal left femur. Findings are  compatible with multiple myeloma. No  pathologic fracture. Lower extremities: Frontal views are obtained from the hips through the ankles. Multiple small rounded lucent lesions are seen within the intertrochanteric left hip, bilateral femoral diaphyses, and right femoral metadiaphyseal region. Indeterminate lucency within the distal fibular diaphysis just proximal to prior ORIF hardware. IMPRESSION: 1. Multiple rounded lucent lesions within the calvarium, left ninth rib, bilateral shoulders, pelvis, and bilateral lower extremities consistent with history of multiple myeloma. 2. Pathologic fracture within the left posterolateral ninth rib at the site of myeloma lesion. Electronically Signed   By: Sharlet Salina M.D.   On: 02/02/2024 15:01   DG Chest Port 1 View Result Date: 01/30/2024 CLINICAL DATA:  Hypercalcemia. EXAM: CHEST - 2 VIEW COMPARISON:  11/17/2023 FINDINGS: Bibasilar atelectasis or scarring. Otherwise no focal consolidation, pleural effusion, or pneumothorax. Normal cardiomediastinal silhouette. No displaced rib fractures. Thoracic spondylosis. IMPRESSION: Bibasilar atelectasis/scarring.  No active cardiopulmonary disease. Electronically Signed   By: Minerva Fester M.D.   On: 01/30/2024 20:21    Labs:  CBC: Recent Labs    01/31/24 0601 02/02/24 0440 02/03/24 0507 02/04/24 0505  WBC 9.9 7.1 7.6 8.6  HGB 11.9* 10.3* 10.4* 11.0*  HCT 38.5 32.7* 32.6* 34.6*  PLT 317 243 262 306    COAGS: No results for input(s): "INR", "APTT" in the last 8760 hours.  BMP: Recent Labs    02/01/24 0542 02/02/24 0440 02/03/24 0507 02/04/24 0505  NA 138 136 137 136  K 2.7* 3.0* 3.1* 3.7  CL 102 105 108 108  CO2 31 23 23 23   GLUCOSE 102* 139* 130* 117*  BUN 12 19 26* 28*  CALCIUM 12.0* 10.1 9.1 8.4*  CREATININE 0.86 0.90 0.75 0.80  GFRNONAA >60 >60 >60 >60    LIVER FUNCTION TESTS: Recent Labs    02/01/24 0542 02/02/24 0440 02/03/24 0507 02/04/24 0505  BILITOT 0.5 0.2 <0.2 0.4  AST  15 16 15  32  ALT 14 14 14  38  ALKPHOS 41 39 39 41  PROT 7.9 7.3 7.4 7.5  ALBUMIN 2.5* 2.4* 2.5* 2.6*    TUMOR MARKERS: No results for input(s): "AFPTM", "CEA", "CA199", "CHROMGRNA" in the last 8760 hours.  Assessment and Plan:  Carla Hanson is a 58 y.o. 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 Had bone survey 02/02/24 with findings:  1. Multiple rounded lucent lesions within the calvarium, left ninth rib, bilateral shoulders, pelvis, and bilateral lower extremities consistent with history of multiple myeloma. 2. Pathologic fracture within the left posterolateral ninth rib at the site of myeloma lesion. IR consulted for CT guided bone marrow biopsy due to concern for hypercalcemia of malignancy/multiple myeloma.  Risks and benefits of CT guided bone marrow biopsy was discussed with the patient and/or patient's family including, but not limited to bleeding, infection, damage to adjacent structures or low yield requiring additional tests.  Patient and family at bedside made aware procedure may be today, or tomorrow pending scheduling availability.  All of the questions were answered and there is agreement to proceed.  Consent signed and in chart.   Thank you for allowing our service to participate in Carla Hanson 's care.  Electronically Signed: Katheren Puller, PA-C   02/04/2024, 9:32 AM      I spent a total of 40 Minutes    in face to face in clinical consultation, greater than 50% of which was counseling/coordinating care  for CT guided bone marrow biopsy.

## 2024-02-04 NOTE — Consult Note (Signed)
 Reason for Consult:Concern for pathologic fracture   Carla Hanson is an 58 y.o. female.  HPI: Carla Hanson is a 58 y.o. 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 Patient had follow-up at the [facility] yesterday 3/27.  She reported feeling lightheaded, dizzy, weak, and dehydrated despite drinking lots of water. She has been feeling fatigued. Labs were obtained and resulted with significantly elevated calcium of 17.8.  Also noted to have albumin 3.8, total protein 9.1, potassium 2.7.  25-hydroxy vitamin D was 66.9 and TSH 0.736.  Patient was called with critical results and advised to present to the ED for further evaluation. Over the last few days she has been undergoing evaluation and there is some concern for multiple myeloma. Given multiple areas of lucency in the pelvis, orthopedics was requested to consult regarding possible need for prophylactic surgical management of hip/femur due to risk of pathologic fracture. Patient reports low back pain and stiffness but denies leg pain. Denies groin pain. Denies increased pain with weightbearing.   Past Medical History:  Diagnosis Date   Alcohol abuse    Anxiety    Back pain    Bipolar disorder (HCC)    Bronchitis    COPD exacerbation (HCC) 05/10/2016   Drug use    History of blood clots    Hyperlipidemia    Hypertension    Influenza A 12/01/2018   Joint pain    Left leg DVT (HCC) 09/29/2013   Lipoma    Abdomen   LIPOMA 01/20/2008   Obesity    Occasional tremors 05/12/2019   Osteoarthritis of right knee    Other fatigue    Painful menstrual periods 01/05/2012   Pneumonia    Prediabetes    Seizure (HCC)    Shortness of breath on exertion    Stroke (HCC)    Tobacco abuse     Past Surgical History:  Procedure Laterality Date   CESAREAN SECTION      CYSTECTOMY     LIPOMA EXCISION  03/2011   ORIF ANKLE FRACTURE  03/13/2012   Procedure: OPEN REDUCTION INTERNAL FIXATION (ORIF) ANKLE FRACTURE;  Surgeon: Kerrin Champagne, MD;  Location: [facility];  Service: Orthopedics;  Laterality: Right;   TUBAL LIGATION      Family History  Problem Relation Age of Onset   Other Mother        High blood pressure runs in the family   Obesity Mother    Asthma Father    Heart attack Father    Obesity Father    Diabetes Maternal Aunt    Breast cancer Neg Hx     Social History:  reports that she quit smoking about 7 years ago. Her smoking use included cigarettes. She started smoking about 42 years ago. She has a 17.5 pack-year smoking history. She has never used smokeless tobacco. She reports current drug use. Drug: Marijuana. She reports that she does not drink alcohol.  Allergies:  Allergies  Allergen Reactions   Latuda [Lurasidone] Other (See Comments)    Made leg muscles twitch (went off this herself in December, 2017)    Medications: I have reviewed the patient's current medications.  Results for orders placed or performed during the hospital encounter of 01/30/24 (from the past 48 hours)  Glucose, capillary     Status: Abnormal   Collection Time:  02/02/24  5:47 PM  Result Value Ref Range   Glucose-Capillary 221 (H) 70 - 99 mg/dL    Comment: Glucose reference range applies only to samples taken after fasting for at least 8 hours.  Glucose, capillary     Status: Abnormal   Collection Time: 02/02/24 11:29 PM  Result Value Ref Range   Glucose-Capillary 149 (H) 70 - 99 mg/dL    Comment: Glucose reference range applies only to samples taken after fasting for at least 8 hours.  CBC     Status: Abnormal   Collection Time: 02/03/24  5:07 AM  Result Value Ref Range   WBC 7.6 4.0 - 10.5 K/uL   RBC 3.87 3.87 - 5.11 MIL/uL   Hemoglobin 10.4 (L) 12.0 - 15.0 g/dL   HCT 16.1 (L) 09.6 - 04.5 %   MCV 84.2 80.0 - 100.0 fL   MCH 26.9 26.0 - 34.0 pg   MCHC  31.9 30.0 - 36.0 g/dL   RDW 40.9 81.1 - 91.4 %   Platelets 262 150 - 400 K/uL   nRBC 0.0 0.0 - 0.2 %    Comment: Performed at [facility], [address], [place], [place] [address]  Comprehensive metabolic panel     Status: Abnormal   Collection Time: 02/03/24  5:07 AM  Result Value Ref Range   Sodium 137 135 - 145 mmol/L   Potassium 3.1 (L) 3.5 - 5.1 mmol/L   Chloride 108 98 - 111 mmol/L   CO2 23 22 - 32 mmol/L   Glucose, Bld 130 (H) 70 - 99 mg/dL    Comment: Glucose reference range applies only to samples taken after fasting for at least 8 hours.   BUN 26 (H) 6 - 20 mg/dL   Creatinine, Ser 6.21 0.44 - 1.00 mg/dL   Calcium 9.1 8.9 - 30.8 mg/dL   Total Protein 7.4 6.5 - 8.1 g/dL   Albumin 2.5 (L) 3.5 - 5.0 g/dL   AST 15 15 - 41 U/L   ALT 14 0 - 44 U/L   Alkaline Phosphatase 39 38 - 126 U/L   Total Bilirubin <0.2 0.0 - 1.2 mg/dL   GFR, Estimated >65 >78 mL/min    Comment: (NOTE) Calculated using the CKD-EPI Creatinine Equation (2021)    Anion gap 6 5 - 15    Comment: Performed at [facility], [address] [place], [place] [address]  Magnesium     Status: None   Collection Time: 02/03/24  5:07 AM  Result Value Ref Range   Magnesium 1.8 1.7 - 2.4 mg/dL    Comment: Performed at [facility], [address] [place], [place] [address]  Phosphorus     Status: Abnormal   Collection Time: 02/03/24  5:07 AM  Result Value Ref Range   Phosphorus 1.6 (L) 2.5 - 4.6 mg/dL    Comment: Performed at [facility], [address], [place], [place] [address]  Glucose, capillary     Status: Abnormal   Collection Time: 02/03/24  5:26 AM  Result Value Ref Range   Glucose-Capillary 128 (H) 70 - 99 mg/dL    Comment: Glucose reference range applies only to samples taken after fasting for at least 8 hours.  Glucose, capillary     Status: Abnormal   Collection Time: 02/03/24 12:18 PM  Result Value Ref Range    Glucose-Capillary 110 (H) 70 - 99 mg/dL    Comment: Glucose reference range applies only to samples  taken after fasting for at least 8 hours.  Glucose, capillary     Status: Abnormal   Collection Time: 02/04/24 12:07 AM  Result Value Ref Range   Glucose-Capillary 135 (H) 70 - 99 mg/dL    Comment: Glucose reference range applies only to samples taken after fasting for at least 8 hours.  Comprehensive metabolic panel     Status: Abnormal   Collection Time: 02/04/24  5:05 AM  Result Value Ref Range   Sodium 136 135 - 145 mmol/L   Potassium 3.7 3.5 - 5.1 mmol/L   Chloride 108 98 - 111 mmol/L   CO2 23 22 - 32 mmol/L   Glucose, Bld 117 (H) 70 - 99 mg/dL    Comment: Glucose reference range applies only to samples taken after fasting for at least 8 hours.   BUN 28 (H) 6 - 20 mg/dL   Creatinine, Ser 6.57 0.44 - 1.00 mg/dL   Calcium 8.4 (L) 8.9 - 10.3 mg/dL   Total Protein 7.5 6.5 - 8.1 g/dL   Albumin 2.6 (L) 3.5 - 5.0 g/dL   AST 32 15 - 41 U/L   ALT 38 0 - 44 U/L   Alkaline Phosphatase 41 38 - 126 U/L   Total Bilirubin 0.4 0.0 - 1.2 mg/dL   GFR, Estimated >84 >69 mL/min    Comment: (NOTE) Calculated using the CKD-EPI Creatinine Equation (2021)    Anion gap 5 5 - 15    Comment: Performed at [facility], [address] [place], [place] [address]  CBC with Differential/Platelet     Status: Abnormal   Collection Time: 02/04/24  5:05 AM  Result Value Ref Range   WBC 8.6 4.0 - 10.5 K/uL   RBC 4.08 3.87 - 5.11 MIL/uL   Hemoglobin 11.0 (L) 12.0 - 15.0 g/dL   HCT 84.1 (L) 32.4 - 40.1 %   MCV 84.8 80.0 - 100.0 fL   MCH 27.0 26.0 - 34.0 pg   MCHC 31.8 30.0 - 36.0 g/dL   RDW 02.7 25.3 - 66.4 %   Platelets 306 150 - 400 K/uL   nRBC 0.0 0.0 - 0.2 %   Neutrophils Relative % 73 %   Neutro Abs 6.3 1.7 - 7.7 K/uL   Lymphocytes Relative 19 %   Lymphs Abs 1.6 0.7 - 4.0 K/uL   Monocytes Relative 8 %   Monocytes Absolute 0.7 0.1 - 1.0 K/uL   Eosinophils Relative 0 %    Eosinophils Absolute 0.0 0.0 - 0.5 K/uL   Basophils Relative 0 %   Basophils Absolute 0.0 0.0 - 0.1 K/uL   Immature Granulocytes 0 %   Abs Immature Granulocytes 0.03 0.00 - 0.07 K/uL    Comment: Performed at [facility], [address], [place], [place] [address]  Phosphorus     Status: Abnormal   Collection Time: 02/04/24  5:05 AM  Result Value Ref Range   Phosphorus 1.8 (L) 2.5 - 4.6 mg/dL    Comment: Performed at [facility], [address] [place], [place] [address]  Glucose, capillary     Status: Abnormal   Collection Time: 02/04/24 12:04 PM  Result Value Ref Range   Glucose-Capillary 110 (H) 70 - 99 mg/dL    Comment: Glucose reference range applies only to samples taken after fasting for at least 8 hours.   *Note: Due to a large number of results and/or encounters for the requested time period, some results have not been displayed. A  complete set of results can be found in Results Review.    DG HIP UNILAT WITH PELVIS 2-3 VIEWS LEFT Result Date: 02/03/2024 CLINICAL DATA:  Pain after injury.  History of myeloma. EXAM: DG HIP (WITH OR WITHOUT PELVIS) 3V LEFT COMPARISON:  X-ray 02/02/2024 FINDINGS: Today's examination is more under penetrated than previous. The subtle lucent lesions are again noted diffusely along the pelvis and proximal femurs consistent with known history of myeloma. Slight joint space loss of the right hip. No fracture or dislocation. IMPRESSION: Multiple lucent lesions as per prior examination. Patient has provided history of multiple myeloma. Electronically Signed   By: Karen Kays M.D.   On: 02/03/2024 19:25    Review of Systems  Constitutional:  Positive for fatigue. Negative for chills and fever.  Respiratory:  Negative for shortness of breath.   Cardiovascular:  Negative for chest pain.  Musculoskeletal:  Positive for back pain. Negative for arthralgias and gait problem.  Neurological:  Positive for weakness.   Psychiatric/Behavioral:  Negative for agitation and confusion.    Blood pressure 136/83, pulse 71, temperature (!) 97.4 F (36.3 C), temperature source Oral, resp. rate (!) 21, height 5\' 2"  (1.575 m), weight 114.8 kg, SpO2 99%. Physical Exam Constitutional:      General: She is not in acute distress.    Appearance: She is obese. She is not ill-appearing, toxic-appearing or diaphoretic.  HENT:     Head: Normocephalic and atraumatic.  Eyes:     Extraocular Movements: Extraocular movements intact.  Cardiovascular:     Rate and Rhythm: Normal rate.  Pulmonary:     Effort: Pulmonary effort is normal.  Musculoskeletal:     Cervical back: Normal range of motion.     Comments: No pain with passive or active motion of the feet, ankles, knees, or hips. No tenderness to palpation about the lower extremities or pelvis. No pain with axial compression BLE. Distally neurovascularly intact in BLE.  Neurological:     Mental Status: She is alert and oriented to person, place, and time. Mental status is at baseline.  Psychiatric:        Mood and Affect: Mood normal.     Assessment/Plan: It appears that the patient is still undergoing workup for multiple myeloma diagnosis. Dr Ave Filter reviewed the xrays of the comprehensive bone survey, especially the pelvis and lower extremities. He does not feel that she has any areas of lucency that are large enough or compromising to the cortex to the point that warrants surgical intervention at this time. The patient is essentially asymptomatic on exam and denies pain with weightbearing. If these circumstances change, we would be happy to re-evaluate the patient.   Kenroy Timberman L. [place], PA-C 02/04/2024, 1:07 PM

## 2024-02-04 NOTE — Progress Notes (Signed)
 PROGRESS NOTE  Carla Hanson  EAV:[id number] DOB: [birth year] DOA: 01/30/2024 PCP: Doreene Eland, MD   Brief Narrative: Patient is a 58 year old female with history of COPD, hypertension, hyperlipidemia, history of PE/DVT on aspirin, prediabetes, obesity,  T6 compression fracture noted January 2025 who presented to the ED for evaluation of hypercalcemia.  Also reported lightheadedness, dizzy, weakness, dehydration, constipation.  Lab work on presentation showed calcium of 17.8, potassium of 2.7.  PTH came back at low.  Given Zometa.  Oncology consulted.  Found to have showed multiple lucent areas concerning for multiple myeloma.  Orthopedics consulted for assessing femoral neck fracture on the background of intertrochanteric lesions.  Plan for bone marrow biopsy and PET/CT as an outpatient.  Already has appointment with oncology.  Assessment & Plan:  Principal Problem:   Hypercalcemia Active Problems:   Hypokalemia   Hyperlipidemia   Essential hypertension   History of pulmonary embolism   Pre-diabetes   COPD, moderate (HCC)   Hypercalcemia of malignancy   Multiple myeloma not having achieved remission (HCC)  Multiple myeloma Initially noted on outpatient labs with calcium 17.8.  Hypercalcemia confirmed with repeat serum calcium in hospital >15.  PTH came back low at 6.  Thiazide diuretic and oral supplements likely contributing to elevated calcium.   25-OH vitamin D level 66.9, TSH 0.736.   She has been symptomatic with GI symptoms and dehydration.   Mentation intact  With low PTH of 6, concern for underlying malignancy.  PTH RP is pending -Ordered 1/25 dihydroxy vitamin D to rule out underlying lymphoma versus granulomatous disease -SPEP and UPEP were ordered , currently pending -Protein electrophoresis from 3/27 showed globulin 5.3 -Oncology,Dr Candise Che agreeing  that this may be due to underlying malignancy. -Skeletal survey obtained shows multiple punched-out lesions,  consistent with possible multiple myeloma. -Plan for bone marrow biopsy, PET/CT as an outpatient - Orthopedics consulted for assessing femoral neck fracture on the background of intertrochanteric lesions.  -PT consulted   Severe hypercalcemia -Resolved with IV fluid, Zometa, calcitonin -Given Decadron 20 mg p.o. daily for 4 days  Lucency in left intertrochanteric femur - left hip x-ray showed subtle lucent lesions  along the pelvis and proximal femurs consistent with known history of myeloma -Orthopedics consultation done to assess risk for femoral neck fracture    Hypertension: Holding HCTZ.  Continue losartan. Started on hydralazine 25 mg p.o. every 8 hours   COPD: Stable without wheezing.  Continue albuterol as needed.   History of DVT/PE: Continue aspirin 81 mg daily.   Hyperlipidemia: Continue rosuvastatin.   Prediabetes: Hemoglobin A1c 6.3%.  Holding metformin and Wegovy. -Check CBG every 6 hours   Hypophosphatemia -Supplemented           DVT prophylaxis:Lovenox     Code Status: Full Code  Family Communication: Family at bedside  Patient status:Inpatient  Patient is from :Home  Anticipated discharge NW:[facility] with [facility]  Estimated DC date:1-2 days   Consultants: Oncology,orthopedics  Procedures:None  Antimicrobials:  Anti-infectives (From admission, onward)    None       Subjective: Patient seen and examined at bedside today.  Hemodynamically stable.  Sitting comfortably in the chair.  Complains of some back pain but not in distress.  On room air.  No lower extremity edema.  No nausea, vomiting or abdominal pain.  Objective: Vitals:   02/03/24 1217 02/03/24 1504 02/03/24 2139 02/04/24 0900  BP: (!) 147/88 122/74 137/82 136/83  Pulse: 69 83 73 71  Resp: 17   (!)  21  Temp: 98.7 F (37.1 C)  (!) 97.4 F (36.3 C)   TempSrc: Oral  Oral   SpO2: 97%  99%   Weight:      Height:        Intake/Output Summary (Last 24 hours) at 02/04/2024  1016 Last data filed at 02/04/2024 0600 Gross per 24 hour  Intake --  Output 1850 ml  Net -1850 ml   Filed Weights   01/30/24 1437  Weight: 114.8 kg    Examination:  General exam: Overall comfortable, not in distress, morbidly obese HEENT: PERRL Respiratory system:  no wheezes or crackles  Cardiovascular system: S1 & S2 heard, RRR.  Gastrointestinal system: Abdomen is nondistended, soft and nontender. Central nervous system: Alert and oriented Extremities: No edema, no clubbing ,no cyanosis Skin: No rashes, no ulcers,no icterus     Data Reviewed: I have personally reviewed following labs and imaging studies  CBC: Recent Labs  Lab 01/29/24 1553 01/30/24 1814 01/31/24 0601 02/02/24 0440 02/03/24 0507 02/04/24 0505  WBC 8.7 9.4 9.9 7.1 7.6 8.6  NEUTROABS 5.3 5.9  --  5.8  --  6.3  HGB 12.7 12.7 11.9* 10.3* 10.4* 11.0*  HCT 39.2 39.5 38.5 32.7* 32.6* 34.6*  MCV 83 83.9 86.5 83.4 84.2 84.8  PLT 352 289 317 243 262 306   Basic Metabolic Panel: Recent Labs  Lab 01/30/24 2155 01/31/24 0601 01/31/24 1254 02/01/24 0542 02/02/24 0440 02/03/24 0507 02/04/24 0505  NA  --  140  --  138 136 137 136  K  --  2.5*  --  2.7* 3.0* 3.1* 3.7  CL  --  102  --  102 105 108 108  CO2  --  30  --  31 23 23 23   GLUCOSE  --  111*  --  102* 139* 130* 117*  BUN  --  12  --  12 19 26* 28*  CREATININE  --  0.65  --  0.86 0.90 0.75 0.80  CALCIUM  --  14.7*  --  12.0* 10.1 9.1 8.4*  MG 1.5*  --  1.6*  --  1.3* 1.8  --   PHOS <1.0* <1.0*  --  1.4* 1.9* 1.6*  --      No results found for this or any previous visit (from the past 240 hours).   Radiology Studies: DG HIP UNILAT WITH PELVIS 2-3 VIEWS LEFT Result Date: 02/03/2024 CLINICAL DATA:  Pain after injury.  History of myeloma. EXAM: DG HIP (WITH OR WITHOUT PELVIS) 3V LEFT COMPARISON:  X-ray 02/02/2024 FINDINGS: Today's examination is more under penetrated than previous. The subtle lucent lesions are again noted diffusely along the  pelvis and proximal femurs consistent with known history of myeloma. Slight joint space loss of the right hip. No fracture or dislocation. IMPRESSION: Multiple lucent lesions as per prior examination. Patient has provided history of multiple myeloma. Electronically Signed   By: Karen Kays M.D.   On: 02/03/2024 19:25   DG Bone Survey Met Result Date: 02/02/2024 CLINICAL DATA:  Multiple myeloma EXAM: METASTATIC BONE SURVEY COMPARISON:  01/30/2024 FINDINGS: Standard bone survey was performed: Lateral skull: Multiple rounded lucent lesions are seen throughout the calvarium, greatest in the frontal and parietal regions, consistent with history of multiple myeloma. Upper extremities: Frontal views are obtained from the shoulders through the wrists. Multiple small faint rounded lucent lesions are seen within the proximal humeri bilaterally consistent with multiple myeloma. No acute fractures. Spine: Frontal and lateral views are obtained. The  T6 compression deformity seen previously is again noted and unchanged. There are no new fractures. No destructive bony lesions are identified. Mild multilevel thoracolumbar spondylosis. Chest: Frontal view of the chest demonstrates an unremarkable cardiac silhouette. No airspace disease, effusion, or pneumothorax. Stable bibasilar scarring. There is a lytic lesion with pathologic fracture in the left posterolateral ninth rib compatible with multiple myeloma. No other acute bony abnormalities. Pelvis: Frontal view of the pelvis including both hips demonstrates multiple rounded lucent lesions within the bilateral iliac bones and right inferior pubic ramus. Rounded lucent lesions are also seen within the intertrochanteric region of the proximal left femur. Findings are compatible with multiple myeloma. No pathologic fracture. Lower extremities: Frontal views are obtained from the hips through the ankles. Multiple small rounded lucent lesions are seen within the intertrochanteric  left hip, bilateral femoral diaphyses, and right femoral metadiaphyseal region. Indeterminate lucency within the distal fibular diaphysis just proximal to prior ORIF hardware. IMPRESSION: 1. Multiple rounded lucent lesions within the calvarium, left ninth rib, bilateral shoulders, pelvis, and bilateral lower extremities consistent with history of multiple myeloma. 2. Pathologic fracture within the left posterolateral ninth rib at the site of myeloma lesion. Electronically Signed   By: Sharlet Salina M.D.   On: 02/02/2024 15:01    Scheduled Meds:  aspirin EC  81 mg Oral Daily   calcitonin (salmon)  1 spray Alternating Nares Daily   enoxaparin (LOVENOX) injection  50 mg Subcutaneous Q24H   hydrALAZINE  25 mg Oral Q8H   losartan  50 mg Oral Daily   pantoprazole  40 mg Oral BID   rosuvastatin  20 mg Oral Daily   Continuous Infusions:   LOS: 5 days   Burnadette Pop, MD [facility] P4/12/2023, 10:16 AM

## 2024-02-04 NOTE — Telephone Encounter (Signed)
 04/02 Pt wants to speak wit Dr.Beasley pt is having a procedure will call after she is out

## 2024-02-04 NOTE — Plan of Care (Signed)
  Problem: Clinical Measurements: °Goal: Respiratory complications will improve °Outcome: Progressing °  °Problem: Activity: °Goal: Risk for activity intolerance will decrease °Outcome: Progressing °  °Problem: Coping: °Goal: Level of anxiety will decrease °Outcome: Progressing °  °Problem: Elimination: °Goal: Will not experience complications related to bowel motility °Outcome: Progressing °  °Problem: Safety: °Goal: Ability to remain free from injury will improve °Outcome: Progressing °  °Problem: Skin Integrity: °Goal: Risk for impaired skin integrity will decrease °Outcome: Progressing °  °

## 2024-02-04 NOTE — Evaluation (Signed)
 Physical Therapy Evaluation Patient Details Name: Carla Hanson MRN: [id number] DOB: 1966/07/24 Today's Date: 02/04/2024  History of Present Illness  58 yo female admitted with hypercalcemia, multiple lucent lesions, pathological fx L 9th rib. Hx of DVT, bipolar d/o, ETOH abuse, obesity, Sz, COPD, ORIF ankle fx, CVA,  drug use, back pain  Clinical Impression  On eval, pt was CGA for mobility. She walked ~135 feet with a RW. Pt presents with general weakness, decreased activity tolerance, and impaired gait and balance. O2 98% on RA, HR 95 bpm, dyspnea 2/4. Pt is agreeable to HHPT f/u if able to have it. She is requesting a rollator and a 3n1.         If plan is discharge home, recommend the following: A little help with walking and/or transfers;A little help with bathing/dressing/bathroom   Can travel by private vehicle        Equipment Recommendations Rollator (4 wheels)-pt stated she prefers rollator instead of RW;BSC/3in1  Recommendations for Other Services       Functional Status Assessment Patient has had a recent decline in their functional status and demonstrates the ability to make significant improvements in function in a reasonable and predictable amount of time.     Precautions / Restrictions Precautions Precautions: Fall Restrictions Weight Bearing Restrictions Per Provider Order: No      Mobility  Bed Mobility               General bed mobility comments: oob in recliner    Transfers Overall transfer level: Needs assistance Equipment used: Rolling walker (2 wheels) Transfers: Sit to/from Stand Sit to Stand: Supervision           General transfer comment: Cues for safety. Increased time.    Ambulation/Gait Ambulation/Gait assistance: Clinical research associate (Feet): 135 Feet Assistive device: Rolling walker (2 wheels) Gait Pattern/deviations: Step-through pattern, Decreased stride length       General Gait Details: Slow gait speed.  Fatigue and dyspnea-1 brief standing rest break mid-distance. O2 98% on RA, HR 95 bpm, dyspnea 2/4  Stairs            Wheelchair Mobility     Tilt Bed    Modified Rankin (Stroke Patients Only)       Balance Overall balance assessment: Needs assistance         Standing balance support: Bilateral upper extremity supported, During functional activity, Reliant on assistive device for balance Standing balance-Leahy Scale: Poor                               Pertinent Vitals/Pain Pain Assessment Pain Assessment: Faces Faces Pain Scale: Hurts little more Pain Location: ribs Pain Descriptors / Indicators: Discomfort Pain Intervention(s): Limited activity within patient's tolerance, Monitored during session, Repositioned    Home Living Family/patient expects to be discharged to:: Private residence Living Arrangements: Alone Available Help at Discharge: Family;Available PRN/intermittently           Home Layout: One level Home Equipment: Grab bars - tub/shower      Prior Function Prior Level of Function : Independent/Modified Independent                     Extremity/Trunk Assessment   Upper Extremity Assessment Upper Extremity Assessment: Generalized weakness    Lower Extremity Assessment Lower Extremity Assessment: Generalized weakness    Cervical / Trunk Assessment Cervical / Trunk Assessment: Normal  Communication  Communication Communication: No apparent difficulties    Cognition Arousal: Alert Behavior During Therapy: WFL for tasks assessed/performed   PT - Cognitive impairments: No apparent impairments                         Following commands: Intact       Cueing Cueing Techniques: Verbal cues     General Comments      Exercises     Assessment/Plan    PT Assessment Patient needs continued PT services  PT Problem List Decreased strength;Decreased activity tolerance;Decreased  mobility;Obesity;Decreased balance;Decreased knowledge of use of DME       PT Treatment Interventions DME instruction;Gait training;Functional mobility training;Therapeutic activities;Therapeutic exercise;Balance training;Patient/family education    PT Goals (Current goals can be found in the Care Plan section)  Acute Rehab PT Goals Patient Stated Goal: home soon PT Goal Formulation: With patient Time For Goal Achievement: 02/18/24 Potential to Achieve Goals: Good    Frequency Min 2X/week     Co-evaluation               AM-PAC PT "6 Clicks" Mobility  Outcome Measure Help needed turning from your back to your side while in a flat bed without using bedrails?: A Little Help needed moving from lying on your back to sitting on the side of a flat bed without using bedrails?: A Little Help needed moving to and from a bed to a chair (including a wheelchair)?: A Little Help needed standing up from a chair using your arms (e.g., wheelchair or bedside chair)?: A Little Help needed to walk in hospital room?: A Little Help needed climbing 3-5 steps with a railing? : A Lot 6 Click Score: 17    End of Session Equipment Utilized During Treatment: Gait belt Activity Tolerance: Patient tolerated treatment well;Patient limited by fatigue Patient left: in chair;with call bell/phone within reach;with family/visitor present        Time: 1610-9604 PT Time Calculation (min) (ACUTE ONLY): 14 min   Charges:   PT Evaluation $PT Eval Low Complexity: 1 Low   PT General Charges $$ ACUTE PT VISIT: 1 Visit           Faye Ramsay, PT Acute Rehabilitation  Office: [phone]

## 2024-02-05 ENCOUNTER — Inpatient Hospital Stay (HOSPITAL_COMMUNITY)

## 2024-02-05 ENCOUNTER — Encounter (HOSPITAL_COMMUNITY): Payer: Self-pay | Admitting: Internal Medicine

## 2024-02-05 ENCOUNTER — Other Ambulatory Visit (HOSPITAL_COMMUNITY): Payer: Self-pay

## 2024-02-05 DIAGNOSIS — C9000 Multiple myeloma not having achieved remission: Secondary | ICD-10-CM | POA: Diagnosis not present

## 2024-02-05 DIAGNOSIS — C9030 Solitary plasmacytoma not having achieved remission: Secondary | ICD-10-CM | POA: Diagnosis not present

## 2024-02-05 DIAGNOSIS — D630 Anemia in neoplastic disease: Secondary | ICD-10-CM | POA: Diagnosis not present

## 2024-02-05 LAB — CBC WITH DIFFERENTIAL/PLATELET
Abs Immature Granulocytes: 0.03 10*3/uL (ref 0.00–0.07)
Basophils Absolute: 0 10*3/uL (ref 0.0–0.1)
Basophils Relative: 0 %
Eosinophils Absolute: 0 10*3/uL (ref 0.0–0.5)
Eosinophils Relative: 0 %
HCT: 34.1 % — ABNORMAL LOW (ref 36.0–46.0)
Hemoglobin: 11 g/dL — ABNORMAL LOW (ref 12.0–15.0)
Immature Granulocytes: 0 %
Lymphocytes Relative: 23 %
Lymphs Abs: 2.1 10*3/uL (ref 0.7–4.0)
MCH: 27.2 pg (ref 26.0–34.0)
MCHC: 32.3 g/dL (ref 30.0–36.0)
MCV: 84.2 fL (ref 80.0–100.0)
Monocytes Absolute: 1 10*3/uL (ref 0.1–1.0)
Monocytes Relative: 11 %
Neutro Abs: 5.9 10*3/uL (ref 1.7–7.7)
Neutrophils Relative %: 66 %
Platelets: 302 10*3/uL (ref 150–400)
RBC: 4.05 MIL/uL (ref 3.87–5.11)
RDW: 15.3 % (ref 11.5–15.5)
WBC: 9 10*3/uL (ref 4.0–10.5)
nRBC: 0 % (ref 0.0–0.2)

## 2024-02-05 LAB — GLUCOSE, CAPILLARY
Glucose-Capillary: 107 mg/dL — ABNORMAL HIGH (ref 70–99)
Glucose-Capillary: 157 mg/dL — ABNORMAL HIGH (ref 70–99)
Glucose-Capillary: 185 mg/dL — ABNORMAL HIGH (ref 70–99)

## 2024-02-05 LAB — UPEP/UIFE/LIGHT CHAINS/TP, 24-HR UR
% BETA, Urine: 18.9 %
ALPHA 1 URINE: 4.9 %
Albumin, U: 37.3 %
Alpha 2, Urine: 13.8 %
Free Kappa Lt Chains,Ur: 15.57 mg/L (ref 1.17–86.46)
Free Kappa/Lambda Ratio: 3.76 (ref 1.83–14.26)
Free Lambda Lt Chains,Ur: 4.14 mg/L (ref 0.27–15.21)
GAMMA GLOBULIN URINE: 25 %
M-SPIKE %, Urine: 13.5 % — ABNORMAL HIGH
M-Spike, Mg/24 Hr: 32 mg/(24.h) — ABNORMAL HIGH
Total Protein, Urine-Ur/day: 234 mg/(24.h) — ABNORMAL HIGH (ref 30–150)
Total Protein, Urine: 16.7 mg/dL
Total Volume: 1400

## 2024-02-05 LAB — CANCER ANTIGEN 27.29: CA 27.29: 269.5 U/mL — ABNORMAL HIGH (ref 0.0–38.6)

## 2024-02-05 LAB — BETA 2 MICROGLOBULIN, SERUM: Beta-2 Microglobulin: 4 mg/L — ABNORMAL HIGH (ref 0.6–2.4)

## 2024-02-05 LAB — PHOSPHORUS: Phosphorus: 1.4 mg/dL — ABNORMAL LOW (ref 2.5–4.6)

## 2024-02-05 MED ORDER — DIPHENHYDRAMINE HCL 50 MG/ML IJ SOLN
INTRAMUSCULAR | Status: AC
  Filled 2024-02-05: qty 1

## 2024-02-05 MED ORDER — HYDRALAZINE HCL 50 MG PO TABS
50.0000 mg | ORAL_TABLET | Freq: Three times a day (TID) | ORAL | 0 refills | Status: DC
  Filled 2024-02-05 (×2): qty 180, 60d supply, fill #0

## 2024-02-05 MED ORDER — FENTANYL CITRATE (PF) 100 MCG/2ML IJ SOLN
INTRAMUSCULAR | Status: AC | PRN
  Administered 2024-02-05 (×2): 50 ug via INTRAVENOUS

## 2024-02-05 MED ORDER — MIDAZOLAM HCL 2 MG/2ML IJ SOLN
INTRAMUSCULAR | Status: AC
  Filled 2024-02-05: qty 4

## 2024-02-05 MED ORDER — METFORMIN HCL 500 MG PO TABS: 500.0000 mg | ORAL_TABLET | Freq: Every day | ORAL | Status: DC

## 2024-02-05 MED ORDER — K PHOS MONO-SOD PHOS DI & MONO 155-852-130 MG PO TABS
500.0000 mg | ORAL_TABLET | Freq: Three times a day (TID) | ORAL | 0 refills | Status: AC
  Filled 2024-02-05 (×2): qty 21, 4d supply, fill #0
  Filled 2024-02-06 – 2024-02-08 (×2): qty 21, 4d supply, fill #1

## 2024-02-05 MED ORDER — MIDAZOLAM HCL 2 MG/2ML IJ SOLN
INTRAMUSCULAR | Status: AC | PRN
  Administered 2024-02-05 (×2): 1 mg via INTRAVENOUS
  Administered 2024-02-05: 2 mg via INTRAVENOUS

## 2024-02-05 MED ORDER — HYDRALAZINE HCL 50 MG PO TABS
50.0000 mg | ORAL_TABLET | Freq: Three times a day (TID) | ORAL | Status: DC
  Administered 2024-02-05: 50 mg via ORAL
  Filled 2024-02-05: qty 1

## 2024-02-05 MED ORDER — FENTANYL CITRATE (PF) 100 MCG/2ML IJ SOLN
INTRAMUSCULAR | Status: AC
Start: 2024-02-05 — End: ?
  Filled 2024-02-05: qty 2

## 2024-02-05 MED ORDER — DIPHENHYDRAMINE HCL 50 MG/ML IJ SOLN
INTRAMUSCULAR | Status: AC | PRN
  Administered 2024-02-05: 25 mg via INTRAVENOUS

## 2024-02-05 NOTE — Procedures (Signed)
 Vascular and Interventional Radiology Procedure Note  Patient: ANAISE STERBENZ DOB: 1965/11/29 Medical Record Number: [id number] Note Date/Time: 02/05/24 8:41 AM   Performing Physician: Roanna Banning, MD Assistant(s): None  Diagnosis: MM   Procedure: BONE MARROW ASPIRATION and BIOPSY  Anesthesia: Conscious Sedation Complications: None Estimated Blood Loss: Minimal Specimens: Sent for Pathology  Findings:  Successful CT-guided bone marrow aspiration and biopsy A total of 1 cores were obtained. Hemostasis of the tract was achieved using Manual Pressure.  Plan: Bed rest for 1 hours.  See detailed procedure note with images in PACS. The patient tolerated the procedure well without incident or complication and was returned to Recovery in stable condition.    Roanna Banning, MD [facility]   Pager. [phone] Clinic. [phone]

## 2024-02-05 NOTE — Discharge Summary (Signed)
 Physician Discharge Summary  ARDITH TEST XBJ:[id number] DOB: 20-May-1966 DOA: 01/30/2024  PCP: Doreene Eland, MD  Admit date: 01/30/2024 Discharge date: 02/05/2024  Admitted From: Home Disposition:  Home  Discharge Condition:Stable CODE STATUS:FULL Diet recommendation: Carb Modified  Brief/Interim Summary: Patient is a 58 year old female with history of COPD, hypertension, hyperlipidemia, history of PE/DVT on aspirin, prediabetes, obesity,  T6 compression fracture noted January 2025 who presented to the ED for evaluation of hypercalcemia.  Also reported lightheadedness, dizzy, weakness, dehydration, constipation.  Lab work on presentation showed calcium of 17.8, potassium of 2.7.  PTH came back at low.  Given Zometa.  Oncology consulted.  Found to have showed multiple lucent areas concerning for multiple myeloma.  Orthopedics consulted for assessing femoral neck fracture on the background of intertrochanteric lesions. S/p  for bone marrow biopsy .Plan for PET/CT as an outpatient.  Already has appointment with oncology.  Medically stable for discharge today to home  Following problems were addressed during the hospitalization:  Multiple myeloma Initially noted on outpatient labs with calcium 17.8.  Hypercalcemia confirmed with repeat serum calcium in hospital >15.  PTH came back low at 6.  Thiazide diuretic and oral supplements likely contributing to elevated calcium.   25-OH vitamin D level 66.9, TSH 0.736.   Mentation intact  With low PTH of 6, concern for underlying malignancy.  PTH RP is pending -Ordered 1/25 dihydroxy vitamin D to rule out underlying lymphoma versus granulomatous disease.Came out to be low -Full Multiple myeloma panel pending -Protein electrophoresis from 3/27 showed globulin 5.3 -Skeletal survey obtained shows multiple punched-out lesions, consistent with possible multiple myeloma. -S/P  bone marrow biopsy on 4/3, plan for PET/CT as an outpatient - Orthopedics  consulted for assessing femoral neck fracture on the background of intertrochanteric lesions. No plan for intervention -PT consulted,recommended HH   Severe hypercalcemia -Resolved with IV fluid, Zometa, calcitonin -Given Decadron 20 mg p.o. daily for 4 days   Lucency in left intertrochanteric femur - left hip x-ray showed subtle lucent lesions  along the pelvis and proximal femurs consistent with known history of myeloma -Orthopedics consultation done to assess risk for femoral neck fracture,no plan for intervention    Hypertension: Holding HCTZ.  Continue losartan. Started on hydralazine 50 mg p.o. every 8 hours   COPD: Stable without wheezing.  Continue albuterol as needed.   History of DVT/PE: Continue aspirin 81 mg daily.   Hyperlipidemia: Continue rosuvastatin.   Prediabetes: Hemoglobin A1c 6.3%.  Reduced dose of metformin to daily only  Hypophosphatemia -Being supplemented.  Check phosphorus level in a week  Discharge Diagnoses:  Principal Problem:   Hypercalcemia Active Problems:   Hypokalemia   Hyperlipidemia   Essential hypertension   History of pulmonary embolism   Pre-diabetes   COPD, moderate (HCC)   Hypercalcemia of malignancy   Multiple myeloma not having achieved remission [facility])    Discharge Instructions  Discharge Instructions     Diet - low sodium heart healthy   Complete by: As directed    Discharge instructions   Complete by: As directed    1)Please take your medications as instructed 2)Follow up with your oncologist as an outpatient. 3)Check your phosphorous level in a week   Increase activity slowly   Complete by: As directed    No wound care   Complete by: As directed       Allergies as of 02/05/2024       Reactions   Latuda [lurasidone] Other (See Comments)   Made  leg muscles twitch (went off this herself in December, 2017)        Medication List     STOP taking these medications    hydrochlorothiazide 25 MG  tablet Commonly known as: HYDRODIURIL       TAKE these medications    acetaminophen 325 MG tablet Commonly known as: TYLENOL Take 650 mg by mouth every 6 (six) hours as needed for mild pain (pain score 1-3).   albuterol (2.5 MG/3ML) 0.083% nebulizer solution Commonly known as: PROVENTIL Take 3 mLs (2.5 mg total) by nebulization every 6 (six) hours as needed for wheezing or shortness of breath.   Ventolin HFA 108 (90 Base) MCG/ACT inhaler Generic drug: albuterol Inhale 2 puffs into the lungs every 6 (six) hours as needed for wheezing or shortness of breath.   aspirin EC 81 MG tablet Take 1 tablet (81 mg total) by mouth daily.   cetirizine 10 MG tablet Commonly known as: ZYRTEC Take 1 tablet (10 mg total) by mouth daily.   ferrous sulfate 325 (65 FE) MG tablet Take 325 mg by mouth daily with breakfast.   hydrALAZINE 50 MG tablet Commonly known as: APRESOLINE Take 1 tablet (50 mg total) by mouth every 8 (eight) hours.   losartan 50 MG tablet Commonly known as: COZAAR Take 1 tablet (50 mg total) by mouth daily.   metFORMIN 500 MG tablet Commonly known as: GLUCOPHAGE Take 1 tablet (500 mg total) by mouth daily with breakfast. What changed: when to take this   Mueller Adjustable Back Brace Misc Use daily to support thoracic spine   phosphorus 155-852-130 MG tablet Commonly known as: K PHOS NEUTRAL Take 2 tablets (500 mg total) by mouth 3 (three) times daily for 4 days.   rosuvastatin 20 MG tablet Commonly known as: CRESTOR TAKE 1 TABLET(20 MG) BY MOUTH DAILY   tiZANidine 4 MG tablet Commonly known as: ZANAFLEX Take 1 tablet (4 mg total) by mouth 3 times daily as needed-between meals & bedtime for muscle spasms.   Wegovy 0.5 MG/0.5ML Soaj Generic drug: Semaglutide-Weight Management Inject 0.5 mg into the skin once a week.               Durable Medical Equipment  (From admission, onward)           Start     Ordered   02/04/24 1253  For home use  only DME 4 wheeled rolling walker with seat  Once       Question:  Patient needs a walker to treat with the following condition  Answer:  Unsteady gait   02/04/24 1255   02/04/24 1252  For home use only DME 3 n 1  Once        02/04/24 1252            Follow-up Information     [facility] Follow up.   Specialty: Home Health Services Why: HH physical therapy-start of care monday 02/09/24 Contact information: [address] [place] [address] [phone]         [facility] Follow up.   Why: rollator/bedside commode Contact information: [address] Dr. Georgena Spurling [place] [address] [phone]        Janit Pagan T, MD. Schedule an appointment as soon as possible for a visit in 1 week(s).   Specialty: Family Medicine Contact information: [address] [place] [address] [phone]                Allergies  Allergen Reactions   Latuda [Lurasidone] Other (See Comments)    Made leg muscles twitch (went off this herself in December, 2017)    Consultations: IR,oncology   Procedures/Studies: DG HIP UNILAT WITH PELVIS 2-3 VIEWS LEFT Result Date: 02/03/2024 CLINICAL DATA:  Pain after injury.  History of myeloma. EXAM: DG HIP (WITH OR WITHOUT PELVIS) 3V LEFT COMPARISON:  X-ray 02/02/2024 FINDINGS: Today's examination is more under penetrated than previous. The subtle lucent lesions are again noted diffusely along the pelvis and proximal femurs consistent with known history of myeloma. Slight joint space loss of the right hip. No fracture or dislocation. IMPRESSION: Multiple lucent lesions as per prior examination. Patient has provided history of multiple myeloma. Electronically Signed   By: Karen Kays M.D.   On: 02/03/2024 19:25   DG Bone Survey Met Result Date: 02/02/2024 CLINICAL DATA:  Multiple myeloma EXAM: METASTATIC BONE SURVEY COMPARISON:  01/30/2024 FINDINGS: Standard bone survey was performed: Lateral skull: Multiple rounded lucent  lesions are seen throughout the calvarium, greatest in the frontal and parietal regions, consistent with history of multiple myeloma. Upper extremities: Frontal views are obtained from the shoulders through the wrists. Multiple small faint rounded lucent lesions are seen within the proximal humeri bilaterally consistent with multiple myeloma. No acute fractures. Spine: Frontal and lateral views are obtained. The T6 compression deformity seen previously is again noted and unchanged. There are no new fractures. No destructive bony lesions are identified. Mild multilevel thoracolumbar spondylosis. Chest: Frontal view of the chest demonstrates an unremarkable cardiac silhouette. No airspace disease, effusion, or pneumothorax. Stable bibasilar scarring. There is a lytic lesion with pathologic fracture in the left posterolateral ninth rib compatible with multiple myeloma. No other acute bony abnormalities. Pelvis: Frontal view of the pelvis including both hips demonstrates multiple rounded lucent lesions within the bilateral iliac bones and right inferior pubic ramus. Rounded lucent lesions are also seen within the intertrochanteric region of the proximal left femur. Findings are compatible with multiple myeloma. No pathologic fracture. Lower extremities: Frontal views are obtained from the hips through the ankles. Multiple small rounded lucent lesions are seen within the intertrochanteric left hip, bilateral femoral diaphyses, and right femoral metadiaphyseal region. Indeterminate lucency within the distal fibular diaphysis just proximal to prior ORIF hardware. IMPRESSION: 1. Multiple rounded lucent lesions within the calvarium, left ninth rib, bilateral shoulders, pelvis, and bilateral lower extremities consistent with history of multiple myeloma. 2. Pathologic fracture within the left posterolateral ninth rib at the site of myeloma lesion. Electronically Signed   By: Sharlet Salina M.D.   On: 02/02/2024 15:01   DG  Chest Port 1 View Result Date: 01/30/2024 CLINICAL DATA:  Hypercalcemia. EXAM: CHEST - 2 VIEW COMPARISON:  11/17/2023 FINDINGS: Bibasilar atelectasis or scarring. Otherwise no focal consolidation, pleural effusion, or pneumothorax. Normal cardiomediastinal silhouette. No displaced rib fractures. Thoracic spondylosis. IMPRESSION: Bibasilar atelectasis/scarring.  No active cardiopulmonary disease. Electronically Signed   By: Minerva Fester M.D.   On: 01/30/2024 20:21      Subjective: Patient seen and examined at bedside today.  Hemodynamically stable but does come from bone marrow biopsy.  Feels comfortable.Denied significant back pain.  Eager to go home today.  Discharge plan discussed with family at bedside  Discharge Exam: Vitals:   02/05/24 0900 02/05/24 1259  BP: (!) 156/89 138/85  Pulse: 84 82  Resp: 18 20  Temp:  98.4 F (36.9 C)  SpO2: 97% 100%   Vitals:   02/05/24 0850 02/05/24 0855 02/05/24 0900 02/05/24 1259  BP: (!) 137/94 (!) 161/89 (!) 156/89 138/85  Pulse: 80 98 84 82  Resp: 20 18 18 20   Temp:    98.4 F (36.9 C)  TempSrc:    Oral  SpO2: 98% 98% 97% 100%  Weight:      Height:        General: Pt is alert, awake, not in acute distress,obese Cardiovascular: RRR, S1/S2 +, no rubs, no gallops Respiratory: CTA bilaterally, no wheezing, no rhonchi Abdominal: Soft, NT, ND, bowel sounds + Extremities: no edema, no cyanosis    The results of significant diagnostics from this hospitalization (including imaging, microbiology, ancillary and laboratory) are listed below for reference.     Microbiology: No results found for this or any previous visit (from the past 240 hours).   Labs: BNP (last 3 results) No results for input(s): "BNP" in the last 8760 hours. Basic Metabolic Panel: Recent Labs  Lab 01/30/24 2155 01/31/24 0601 01/31/24 1254 02/01/24 0542 02/02/24 0440 02/03/24 0507 02/04/24 0505 02/05/24 0501  NA  --  140  --  138 136 137 136  --   K  --   2.5*  --  2.7* 3.0* 3.1* 3.7  --   CL  --  102  --  102 105 108 108  --   CO2  --  30  --  31 23 23 23   --   GLUCOSE  --  111*  --  102* 139* 130* 117*  --   BUN  --  12  --  12 19 26* 28*  --   CREATININE  --  0.65  --  0.86 0.90 0.75 0.80  --   CALCIUM  --  14.7*  --  12.0* 10.1 9.1 8.4*  --   MG 1.5*  --  1.6*  --  1.3* 1.8  --   --   PHOS <1.0* <1.0*  --  1.4* 1.9* 1.6* 1.8* 1.4*   Liver Function Tests: Recent Labs  Lab 01/30/24 1516 01/31/24 0601 02/01/24 0542 02/02/24 0440 02/03/24 0507 02/04/24 0505  AST 17  --  15 16 15  32  ALT 13  --  14 14 14  38  ALKPHOS 45  --  41 39 39 41  BILITOT 0.8  --  0.5 0.2 <0.2 0.4  PROT 9.3*  --  7.9 7.3 7.4 7.5  ALBUMIN 3.2* 2.8* 2.5* 2.4* 2.5* 2.6*   No results for input(s): "LIPASE", "AMYLASE" in the last 168 hours. No results for input(s): "AMMONIA" in the last 168 hours. CBC: Recent Labs  Lab 01/29/24 1553 01/30/24 1814 01/30/24 1814 01/31/24 0601 02/02/24 0440 02/03/24 0507 02/04/24 0505 02/05/24 0501  WBC 8.7 9.4   < > 9.9 7.1 7.6 8.6 9.0  NEUTROABS 5.3 5.9  --   --  5.8  --  6.3 5.9  HGB 12.7 12.7   < > 11.9* 10.3* 10.4* 11.0* 11.0*  HCT 39.2 39.5   < > 38.5 32.7* 32.6* 34.6* 34.1*  MCV 83 83.9   < > 86.5 83.4 84.2 84.8 84.2  PLT 352 289   < > 317 243 262 306 302   < > = values in this interval not displayed.   Cardiac Enzymes: Recent Labs  Lab 01/29/24 1553  CKTOTAL 93   BNP: Invalid input(s): "POCBNP" CBG: Recent Labs  Lab 02/04/24 1204 02/04/24 1741 02/05/24 0010 02/05/24 0630 02/05/24 1316  GLUCAP 110* 237* 157* 107* 185*   D-Dimer No results for input(s): "DDIMER" in the last 72  hours. Hgb A1c No results for input(s): "HGBA1C" in the last 72 hours. Lipid Profile No results for input(s): "CHOL", "HDL", "LDLCALC", "TRIG", "CHOLHDL", "LDLDIRECT" in the last 72 hours. Thyroid function studies No results for input(s): "TSH", "T4TOTAL", "T3FREE", "THYROIDAB" in the last 72 hours.  Invalid input(s):  "FREET3" Anemia work up No results for input(s): "VITAMINB12", "FOLATE", "FERRITIN", "TIBC", "IRON", "RETICCTPCT" in the last 72 hours. Urinalysis    Component Value Date/Time   COLORURINE YELLOW 11/08/2016 1117   APPEARANCEUR HAZY (A) 11/08/2016 1117   LABSPEC 1.020 11/08/2016 1117   PHURINE 6.0 11/08/2016 1117   GLUCOSEU NEGATIVE 11/08/2016 1117   HGBUR NEGATIVE 11/08/2016 1117   BILIRUBINUR NEGATIVE 11/08/2016 1117   BILIRUBINUR NEG 12/29/2013 0900   KETONESUR 5 (A) 11/08/2016 1117   PROTEINUR 30 (A) 11/08/2016 1117   UROBILINOGEN 0.2 12/29/2013 0900   UROBILINOGEN 0.2 03/07/2011 1430   NITRITE NEGATIVE 11/08/2016 1117   LEUKOCYTESUR TRACE (A) 11/08/2016 1117   Sepsis Labs Recent Labs  Lab 02/02/24 0440 02/03/24 0507 02/04/24 0505 02/05/24 0501  WBC 7.1 7.6 8.6 9.0   Microbiology No results found for this or any previous visit (from the past 240 hours).  Please note: You were cared for by a hospitalist during your hospital stay. Once you are discharged, your primary care physician will handle any further medical issues. Please note that NO REFILLS for any discharge medications will be authorized once you are discharged, as it is imperative that you return to your primary care physician (or establish a relationship with a primary care physician if you do not have one) for your post hospital discharge needs so that they can reassess your need for medications and monitor your lab values.    Time coordinating discharge: 40 minutes  SIGNED:   Burnadette Pop, MD  [facility] 02/05/2024, 1:25 PM Pager [phone]  If 7PM-7AM, please contact night-coverage [email or web address] Password [email or web address]

## 2024-02-05 NOTE — TOC Transition Note (Signed)
 Transition of Care [facility]) - Discharge Note   Patient Details  Name: Carla Hanson MRN: [id number] Date of Birth: October 28, 1966  Transition of Care [facility]) CM/SW Contact:  Lanier Clam, RN Phone Number: 02/05/2024, 11:03 AM   Clinical Narrative: d/c home w/[facility] start of care 02/09/24-patient agreed. Dme delivered by [employer] rollator,3n1. Has own transport home. No further CM needs.      Final next level of care: Home w Home Health Services Barriers to Discharge: No Barriers Identified   Patient Goals and CMS Choice Patient states their goals for this hospitalization and ongoing recovery are:: Home CMS Medicare.gov Compare Post Acute Care list provided to:: Patient Choice offered to / list presented to : Patient  ownership interest in [facility].provided to:: Patient    Discharge Placement                       Discharge Plan and Services Additional resources added to the After Visit Summary for     Discharge Planning Services: CM Consult            DME Arranged: 3-N-1, Walker rolling with seat DME Agency: [employer] Date DME Agency Contacted: 02/04/24 Time DME Agency Contacted: 1256 Representative spoke with at DME Agency: Vaughan Basta HH Arranged: PT HH Agency: [facility] Date [facility] Agency Contacted: 02/04/24 Time HH Agency Contacted: 0930 Representative spoke with at [facility] Agency: Clifton Custard  Social Drivers of Health (SDOH) Interventions SDOH Screenings   Food Insecurity: No Food Insecurity (01/31/2024)  Housing: Low Risk  (01/31/2024)  Transportation Needs: No Transportation Needs (01/31/2024)  Utilities: Not At Risk (01/31/2024)  Depression (PHQ2-9): Low Risk  (12/23/2023)  Financial Resource Strain: Low Risk  (11/25/2023)  Physical Activity: Sufficiently Active (11/25/2023)  Social Connections: Moderately Integrated (01/31/2024)  Stress: No Stress Concern Present (11/25/2023)  Tobacco Use: Medium Risk (01/30/2024)      Readmission Risk Interventions    02/03/2024    1:14 PM  Readmission Risk Prevention Plan  Transportation Screening Complete  PCP or Specialist Appt within 3-5 Days Complete  Social Work Consult for Recovery Care Planning/Counseling Complete  Palliative Care Screening Not Applicable  Medication Review Oceanographer) Complete

## 2024-02-06 ENCOUNTER — Telehealth: Payer: Self-pay

## 2024-02-06 ENCOUNTER — Other Ambulatory Visit (INDEPENDENT_AMBULATORY_CARE_PROVIDER_SITE_OTHER): Payer: Self-pay | Admitting: Family Medicine

## 2024-02-06 ENCOUNTER — Other Ambulatory Visit (HOSPITAL_COMMUNITY): Payer: Self-pay

## 2024-02-06 ENCOUNTER — Encounter: Payer: Self-pay | Admitting: Family Medicine

## 2024-02-06 DIAGNOSIS — I10 Essential (primary) hypertension: Secondary | ICD-10-CM

## 2024-02-06 MED ORDER — ALBUTEROL SULFATE (2.5 MG/3ML) 0.083% IN NEBU: 2.5000 mg | INHALATION_SOLUTION | Freq: Four times a day (QID) | RESPIRATORY_TRACT | 2 refills | Status: AC | PRN

## 2024-02-06 NOTE — Transitions of Care (Post Inpatient/ED Visit) (Signed)
   02/06/2024  Name: AZZURE GARABEDIAN MRN: [id number] DOB: 30-Mar-1966  Today's TOC FU Call Status: Today's TOC FU Call Status:: Unsuccessful Call (1st Attempt) Unsuccessful Call (1st Attempt) Date: 02/06/24  Attempted to reach the patient regarding the most recent Inpatient/ED visit.  Follow Up Plan: Additional outreach attempts will be made to reach the patient to complete the Transitions of Care (Post Inpatient/ED visit) call.   Hilbert Odor RN, CCM [place]  VBCI-Population Health RN Care Manager [phone]

## 2024-02-06 NOTE — Transitions of Care (Post Inpatient/ED Visit) (Signed)
 02/06/2024  Name: Carla Hanson MRN: [id number] DOB: 1966/11/02  Today's TOC FU Call Status: Today's TOC FU Call Status:: Successful TOC FU Call Completed TOC FU Call Complete Date: 02/06/24 Patient's Name and Date of Birth confirmed.  Transition Care Management Follow-up Telephone Call How have you been since you were released from the hospital?: Better (Patient states, I feel a lot better") Any questions or concerns?: No (patient denies but feels the medication review was helpful)  Items Reviewed: Did you receive and understand the discharge instructions provided?: Yes Any new allergies since your discharge?: No Dietary orders reviewed?: Yes Type of Diet Ordered:: low sodium heart healthy Do you have support at home?: Yes People in Home: alone Name of Support/Comfort Primary Source: Patient states she lives alone but her mother, brother live in same complex and daughter lives away and is with patient at time of call  Medications Reviewed Today: Medications Reviewed Today     Reviewed by Jessy Oto, RN (Registered Nurse) on 02/06/24 at 1259  Med List Status: <None>   Medication Order Taking? Sig Documenting Provider Last Dose Status Informant  acetaminophen (TYLENOL) 325 MG tablet [id number] Yes Take 650 mg by mouth every 6 (six) hours as needed for mild pain (pain score 1-3). [provider] Taking Active Self, Pharmacy Records  albuterol (PROVENTIL) (2.5 MG/3ML) 0.083% nebulizer solution [id number] Yes Take 3 mLs (2.5 mg total) by nebulization every 6 (six) hours as needed for wheezing or shortness of breath. Doreene Eland, MD Taking Active   albuterol (VENTOLIN HFA) 108 (90 Base) MCG/ACT inhaler [id number] Yes Inhale 2 puffs into the lungs every 6 (six) hours as needed for wheezing or shortness of breath. Doreene Eland, MD Taking Active Self, Pharmacy Records  aspirin EC 81 MG EC tablet [id number] Yes Take 1 tablet (81 mg total) by mouth daily. Layne Benton, NP Taking Active Self, Pharmacy Records  cetirizine (ZYRTEC) 10 MG tablet [id number] No Take 1 tablet (10 mg total) by mouth daily.  Patient not taking: Reported on 02/06/2024   Doreene Eland, MD Not Taking Active Self, Pharmacy Records           Med Note [facility], [place] A   Fri Feb 06, 2024 12:39 PM) Patient currently out and will call pharmacy  Elastic Bandages & Supports (MUELLER ADJUSTABLE BACK BRACE) MISC [id number] Yes Use daily to support thoracic spine Doreene Eland, MD Taking Active Self, Pharmacy Records  ferrous sulfate 325 (65 FE) MG tablet [id number] No Take 325 mg by mouth daily with breakfast.  Patient not taking: Reported on 02/06/2024   [provider] Not Taking Active Self, Pharmacy Records           Med Note [facility], [place] A   Fri Feb 06, 2024 12:39 PM) Patient states she is not currently taking  hydrALAZINE (APRESOLINE) 50 MG tablet [id number] Yes Take 1 tablet (50 mg total) by mouth every 8 (eight) hours. Burnadette Pop, MD Taking Active   losartan (COZAAR) 50 MG tablet [id number] Yes Take 1 tablet (50 mg total) by mouth daily. Quillian Quince D, MD Taking Active Self, Pharmacy Records  metFORMIN (GLUCOPHAGE) 500 MG tablet [id number] Yes Take 1 tablet (500 mg total) by mouth daily with breakfast. Burnadette Pop, MD Taking Active   phosphorus (K PHOS NEUTRAL) [phone] MG tablet [id number] Yes Take 2 tablets (500 mg total) by mouth 3 (three) times daily for 4 days. Burnadette Pop, MD Taking Active   rosuvastatin (CRESTOR)  20 MG tablet [id number] Yes TAKE 1 TABLET(20 MG) BY MOUTH DAILY Doreene Eland, MD Taking Active Self, Pharmacy Records  Semaglutide-Weight Management [facility]) 0.5 MG/0.5ML Ivory Broad [id number] No Inject 0.5 mg into the skin once a week.  Patient not taking: Reported on 02/06/2024   Wilder Glade, MD Not Taking Active Self, Pharmacy Records           Med Note [facility], [place] A   Fri Feb 06, 2024 12:59 PM) Patient will discuss dose with PCP  02/10/24  tiZANidine (ZANAFLEX) 4 MG tablet [id number] Yes Take 1 tablet (4 mg total) by mouth 3 times daily as needed-between meals & bedtime for muscle spasms. Doreene Eland, MD Taking Active Self, Pharmacy Records            Home Care and Equipment/Supplies: Were Home Health Services Ordered?: Yes Name of Home Health Agency:: [facility] PT Has Agency set up a time to come to your home?: Yes First Home Health Visit Date: 02/09/24 Any new equipment or medical supplies ordered?: Yes Name of Medical supply agency?: [employer] Were you able to get the equipment/medical supplies?: Yes Do you have any questions related to the use of the equipment/supplies?: No  Functional Questionnaire: Do you need assistance with bathing/showering or dressing?: No (Will take first shower with family present) Do you need assistance with meal preparation?: No (Patient states she is up and moving around fine) Do you need assistance with eating?: No Do you have difficulty maintaining continence: No Do you need assistance with getting out of bed/getting out of a chair/moving?: No Do you have difficulty managing or taking your medications?: No (Discussed with patient and daughter for daughter to watch the first time)  Follow up appointments reviewed: PCP Follow-up appointment confirmed?: Yes Date of PCP follow-up appointment?: 02/10/24 Follow-up Provider: Janit Pagan Specialist [facility] Follow-up appointment confirmed?: Yes Date of Specialist follow-up appointment?: 02/12/24 Follow-Up Specialty Provider:: Oncology, Candise Che, Gautam, Kishore Do you need transportation to your follow-up appointment?: No Do you understand care options if your condition(s) worsen?: Yes-patient verbalized understanding  SDOH Interventions Today    Flowsheet Row Most Recent Value  SDOH Interventions   Food Insecurity Interventions Intervention Not Indicated  Housing Interventions Intervention Not Indicated   Transportation Interventions Intervention Not Indicated  Utilities Interventions Intervention Not Indicated       Goals Addressed             This Visit's Progress    TOC Care Plan - Patient will report no readmissions in the next 30 days       Current Barriers:  Medication management TOC RN reviewed in detail medication list with patient and daughter after patient reported she had not looked at her discharge papers yet. Daughter took notes and states she will assist with medications Chronic Disease Management support and education needs related to possible Multiple Myeloma RNCM Clinical Goal(s):  Patient will work with the Care Management team over the next 30 days to address Transition of Care Barriers: Medication Management verbalize understanding of plan for management of HTN, COPD, and possible new diagnosis of multiple myeloma as evidenced by patient verbalizing understanding take all medications exactly as prescribed and will call provider for medication related questions as evidenced by patient report demonstrate understanding of rationale for each prescribed medication as evidenced by patient report attend all scheduled medical appointments:  PCP appointment 02/10/24 and oncology appointment 02/12/24  Interventions: Evaluation of current treatment plan related to  self management and patient's adherence  to plan as established by provider  Transitions of Care:  New goal. Labs education provided regarding need for exam - reinforced need for labs with phosphorus check Doctor Visits  - discussed the importance of doctor visits Educated on low sodium heart healthy diet Reviewed DME - 3 in 1 and rolling walker reported by patient to be in home Home Health with [facility] - Patient verbalized they are scheduled for 02/09/24 Educated discuss cetirizine refilled with PCP, patient reports not taking iron and will discuss with PCP, patient reported prior to admission Md was going to  decrease Wegovy that she usually takes on Thursday and will discuss with PCP Educated obtaining BP and Blood sugar parameters with MD and checking both daily related changes with medications and to take readings and discharge papers to PCP appointment  COPD Interventions:  (Status:  New goal.) Short Term Goal Provided instruction about proper use of medications used for management of COPD including inhalers   Oncology:  (Status: New goal.) Short Term Goal Assessment of understanding of oncology diagnosis:  Reviewed upcoming provider appointments and treatment appointments  Patient Goals/Self-Care Activities: Participate in Transition of Care Program/Attend [facility] scheduled calls Notify RN Care Manager of TOC call rescheduling needs Take all medications as prescribed Attend all scheduled provider appointments Call pharmacy for medication refills 3-7 days in advance of running out of medications Patient will reivew Cetirizine refill, iron, Wegovy with MD Patient will check blood sugar and blood pressure and take results to PCP appointment   Follow Up Plan:  Telephone follow up appointment with care management team member scheduled for:  02/13/24 10am The patient has been provided with contact information for the care management team and has been advised to call with any health related questions or concerns.           Hilbert Odor RN, CCM [place]  VBCI-Population Health RN Care Manager [phone]

## 2024-02-07 ENCOUNTER — Other Ambulatory Visit (HOSPITAL_COMMUNITY): Payer: Self-pay

## 2024-02-08 LAB — MULTIPLE MYELOMA PANEL, SERUM
Albumin SerPl Elph-Mcnc: 2.8 g/dL — ABNORMAL LOW (ref 2.9–4.4)
Albumin/Glob SerPl: 0.6 — ABNORMAL LOW (ref 0.7–1.7)
Alpha 1: 0.3 g/dL (ref 0.0–0.4)
Alpha2 Glob SerPl Elph-Mcnc: 1 g/dL (ref 0.4–1.0)
B-Globulin SerPl Elph-Mcnc: 0.8 g/dL (ref 0.7–1.3)
Gamma Glob SerPl Elph-Mcnc: 2.5 g/dL — ABNORMAL HIGH (ref 0.4–1.8)
Globulin, Total: 4.7 g/dL — ABNORMAL HIGH (ref 2.2–3.9)
IgA: 43 mg/dL — ABNORMAL LOW (ref 87–352)
IgG (Immunoglobin G), Serum: 3485 mg/dL — ABNORMAL HIGH (ref 586–1602)
IgM (Immunoglobulin M), Srm: 15 mg/dL — ABNORMAL LOW (ref 26–217)
M Protein SerPl Elph-Mcnc: 2.2 g/dL — ABNORMAL HIGH
Total Protein ELP: 7.5 g/dL (ref 6.0–8.5)

## 2024-02-09 ENCOUNTER — Other Ambulatory Visit (HOSPITAL_COMMUNITY): Payer: Self-pay

## 2024-02-09 ENCOUNTER — Telehealth: Payer: Self-pay

## 2024-02-09 ENCOUNTER — Other Ambulatory Visit (INDEPENDENT_AMBULATORY_CARE_PROVIDER_SITE_OTHER): Payer: Self-pay | Admitting: Family Medicine

## 2024-02-09 ENCOUNTER — Encounter (INDEPENDENT_AMBULATORY_CARE_PROVIDER_SITE_OTHER): Payer: Self-pay | Admitting: Family Medicine

## 2024-02-09 ENCOUNTER — Ambulatory Visit (INDEPENDENT_AMBULATORY_CARE_PROVIDER_SITE_OTHER): Admitting: Family Medicine

## 2024-02-09 DIAGNOSIS — Z6841 Body Mass Index (BMI) 40.0 and over, adult: Secondary | ICD-10-CM | POA: Diagnosis not present

## 2024-02-09 DIAGNOSIS — M25511 Pain in right shoulder: Secondary | ICD-10-CM | POA: Diagnosis not present

## 2024-02-09 DIAGNOSIS — I10 Essential (primary) hypertension: Secondary | ICD-10-CM

## 2024-02-09 DIAGNOSIS — R609 Edema, unspecified: Secondary | ICD-10-CM

## 2024-02-09 DIAGNOSIS — F319 Bipolar disorder, unspecified: Secondary | ICD-10-CM | POA: Diagnosis not present

## 2024-02-09 DIAGNOSIS — E669 Obesity, unspecified: Secondary | ICD-10-CM

## 2024-02-09 DIAGNOSIS — M25512 Pain in left shoulder: Secondary | ICD-10-CM | POA: Diagnosis not present

## 2024-02-09 DIAGNOSIS — J449 Chronic obstructive pulmonary disease, unspecified: Secondary | ICD-10-CM | POA: Diagnosis not present

## 2024-02-09 DIAGNOSIS — E785 Hyperlipidemia, unspecified: Secondary | ICD-10-CM | POA: Diagnosis not present

## 2024-02-09 DIAGNOSIS — Z8673 Personal history of transient ischemic attack (TIA), and cerebral infarction without residual deficits: Secondary | ICD-10-CM | POA: Diagnosis not present

## 2024-02-09 DIAGNOSIS — S72142D Displaced intertrochanteric fracture of left femur, subsequent encounter for closed fracture with routine healing: Secondary | ICD-10-CM | POA: Diagnosis not present

## 2024-02-09 DIAGNOSIS — S22000D Wedge compression fracture of unspecified thoracic vertebra, subsequent encounter for fracture with routine healing: Secondary | ICD-10-CM | POA: Diagnosis not present

## 2024-02-09 DIAGNOSIS — E876 Hypokalemia: Secondary | ICD-10-CM | POA: Diagnosis not present

## 2024-02-09 DIAGNOSIS — D509 Iron deficiency anemia, unspecified: Secondary | ICD-10-CM | POA: Diagnosis not present

## 2024-02-09 DIAGNOSIS — R7303 Prediabetes: Secondary | ICD-10-CM | POA: Diagnosis not present

## 2024-02-09 DIAGNOSIS — Z7982 Long term (current) use of aspirin: Secondary | ICD-10-CM | POA: Diagnosis not present

## 2024-02-09 DIAGNOSIS — Z7984 Long term (current) use of oral hypoglycemic drugs: Secondary | ICD-10-CM | POA: Diagnosis not present

## 2024-02-09 DIAGNOSIS — Z86711 Personal history of pulmonary embolism: Secondary | ICD-10-CM | POA: Diagnosis not present

## 2024-02-09 DIAGNOSIS — Z86718 Personal history of other venous thrombosis and embolism: Secondary | ICD-10-CM | POA: Diagnosis not present

## 2024-02-09 DIAGNOSIS — E860 Dehydration: Secondary | ICD-10-CM | POA: Diagnosis not present

## 2024-02-09 DIAGNOSIS — C9000 Multiple myeloma not having achieved remission: Secondary | ICD-10-CM | POA: Diagnosis not present

## 2024-02-09 DIAGNOSIS — M1711 Unilateral primary osteoarthritis, right knee: Secondary | ICD-10-CM | POA: Diagnosis not present

## 2024-02-09 DIAGNOSIS — F419 Anxiety disorder, unspecified: Secondary | ICD-10-CM | POA: Diagnosis not present

## 2024-02-09 DIAGNOSIS — G8929 Other chronic pain: Secondary | ICD-10-CM | POA: Diagnosis not present

## 2024-02-09 LAB — SURGICAL PATHOLOGY

## 2024-02-09 MED ORDER — LOSARTAN POTASSIUM 50 MG PO TABS
50.0000 mg | ORAL_TABLET | Freq: Every day | ORAL | 0 refills | Status: DC
  Filled 2024-02-09: qty 30, 30d supply, fill #0

## 2024-02-09 MED ORDER — LOSARTAN POTASSIUM 50 MG PO TABS: 50.0000 mg | ORAL_TABLET | Freq: Every day | ORAL | 0 refills | Status: DC

## 2024-02-09 NOTE — Transitions of Care (Post Inpatient/ED Visit) (Signed)
   02/09/2024  Name: Carla Hanson MRN: [id number] DOB: [birth year]  Today's TOC FU Call Status:    Attempted to reach the patient regarding the most recent Inpatient/ED visit.  Follow Up Plan: TOC RN will review 02/10/24 PCP visit note to see if patient's concern was addressed.  Additional outreach attempts will be made to reach the patient if needed. Otherwise, TOC RN will follow up as previously scheduled 02/13/24  Hilbert Odor RN, CCM [place]  VBCI-Population Health RN Care Manager [phone]

## 2024-02-09 NOTE — Telephone Encounter (Signed)
 Jae Dire PT with [facility] calls nurse line for [facility] PT orders as follows.   1x a week for 5 weeks.   Verbal order given.   She also reports she needs a Scientist, research (life sciences). Advised she has an apt tomorrow for OV notes to support and have DME ordered.   She reports the patient voiced frustration over hydrochlorothiazide. She reports this was discontinued and replaced with Hydralazine and to continue Losartan.   Jae Dire reports her blood pressure was well controlled today, however she noted some bilateral LE swelling. No chest pains or SOB.   Will forward to PCP to make aware for apt tomorrow.

## 2024-02-09 NOTE — Progress Notes (Signed)
 Office: [phone]  /  Fax: [phone]  WEIGHT SUMMARY AND BIOMETRICS  Anthropometric Measurements Height: 5\' 7"  (1.702 m) Weight: 270 lb (122.5 kg) BMI (Calculated): 42.28 Weight at Last Visit: 253 lb Weight Lost Since Last Visit: 0 Weight Gained Since Last Visit: 17 lb Starting Weight: 324 lb Total Weight Loss (lbs): 54 lb (24.5 kg) Peak Weight: 334 lb   Body Composition  Body Fat %: 62 % Fat Mass (lbs): 167.6 lbs Muscle Mass (lbs): 97.4 lbs Total Body Water (lbs): 54 lbs Visceral Fat Rating : 20   Other Clinical Data Fasting: no Labs: no Today's Visit #: 56    Chief Complaint: OBESITY   History of Present Illness The patient presents for follow-up after hospitalization for hypercalcemia.  She was hospitalized after a lab test revealed an extremely elevated calcium level of 17. During her hospital stay, her calcium levels were managed, and she was discharged with instructions to follow up with her primary care provider and an oncologist. She denies a history of multiple myeloma, although recent tests indicated bone thinning in certain areas.  She was taken off her hydrochlorothiazide blood pressure medication during her hospital stay, resulting in swollen ankles. Hydrochlorothiazide was discontinued due to its potential to increase calcium levels. She was prescribed hydralazine but has not been taking hydralazine yet as she has questions about this medication.  Her vitamin D supplements were held as her vitamin D levels were adequate and Ca high. She continues to take iron pills and consumes two glasses of milk daily instead of calcium supplements which she was told would be ok in the hospital.  She has a history of prediabetes, managed with metformin 500 mg once daily, and obesity, for which she was previously on Wegovy. She has not taken [facility] since her hospitalization but reports a good appetite and has been eating well. She has three pens of Wegovy remaining.  She was told to decrease her metformin to 500 mg 1x per day but it is not clear why.      PHYSICAL EXAM:  Blood pressure 134/74, pulse 82, temperature 98.2 F (36.8 C), height 5\' 7"  (1.702 m), weight 270 lb (122.5 kg), SpO2 96%. Body mass index is 42.29 kg/m.  DIAGNOSTIC DATA REVIEWED:  BMET    Component Value Date/Time   NA 136 02/04/2024 0505   NA 139 01/29/2024 1553   K 3.7 02/04/2024 0505   CL 108 02/04/2024 0505   CO2 23 02/04/2024 0505   GLUCOSE 117 (H) 02/04/2024 0505   BUN 28 (H) 02/04/2024 0505   BUN 15 01/29/2024 1553   CREATININE 0.80 02/04/2024 0505   CREATININE 0.57 11/09/2014 1032   CALCIUM 8.4 (L) 02/04/2024 0505   GFRNONAA >60 02/04/2024 0505   GFRNONAA >89 12/29/2013 0942   GFRAA 127 12/08/2019 1210   GFRAA >89 12/29/2013 0942   Lab Results  Component Value Date   HGBA1C 6.3 (H) 01/29/2024   HGBA1C 6.0 12/31/2012   Lab Results  Component Value Date   INSULIN 35.5 (H) 01/29/2024   INSULIN 77.3 (H) 01/03/2021   Lab Results  Component Value Date   TSH 0.736 01/29/2024   CBC    Component Value Date/Time   WBC 9.0 02/05/2024 0501   RBC 4.05 02/05/2024 0501   HGB 11.0 (L) 02/05/2024 0501   HGB 12.7 01/29/2024 1553   HCT 34.1 (L) 02/05/2024 0501   HCT 39.2 01/29/2024 1553   PLT 302 02/05/2024 0501   PLT 352 01/29/2024 1553  MCV 84.2 02/05/2024 0501   MCV 83 01/29/2024 1553   MCH 27.2 02/05/2024 0501   MCHC 32.3 02/05/2024 0501   RDW 15.3 02/05/2024 0501   RDW 13.7 01/29/2024 1553   Iron Studies    Component Value Date/Time   IRON 32 12/03/2016 1442   TIBC 363 12/03/2016 1442   FERRITIN 13 (L) 02/09/2018 1501   IRONPCTSAT 9 (LL) 12/03/2016 1442   Lipid Panel     Component Value Date/Time   CHOL 138 03/25/2023 0748   TRIG 121 03/25/2023 0748   HDL 44 03/25/2023 0748   CHOLHDL 5.7 (H) 05/15/2021 0929   CHOLHDL 6.3 (H) 01/08/2017 0947   VLDL 40 (H) 01/08/2017 0947   LDLCALC 72 03/25/2023 0748   Hepatic Function Panel      Component Value Date/Time   PROT 7.5 02/04/2024 0505   PROT 9.1 (H) 01/29/2024 1553   ALBUMIN 2.6 (L) 02/04/2024 0505   ALBUMIN 3.8 01/29/2024 1553   AST 32 02/04/2024 0505   ALT 38 02/04/2024 0505   ALKPHOS 41 02/04/2024 0505   BILITOT 0.4 02/04/2024 0505   BILITOT 0.4 01/29/2024 1553      Component Value Date/Time   TSH 0.736 01/29/2024 1553   Nutritional Lab Results  Component Value Date   VD25OH 66.9 01/29/2024   VD25OH 70.4 07/29/2023   VD25OH 63.8 03/25/2023     Assessment and Plan Assessment & Plan Hypercalcemia Severe hypercalcemia with calcium level of 17, likely secondary to bone resorption from suspected multiple myeloma. Hydrochlorothiazide and vitamin D supplementation were discontinued to prevent exacerbation. - Discontinue hydrochlorothiazide - Discontinue vitamin D supplementation - Make sure to keep your hospital follow up with your PCP Dr Lum Babe tomorrow - Follow up with oncology for evaluation of suspected multiple myeloma  Suspected Multiple Myeloma Bone lucency observed, concerning for multiple myeloma, contributing to hypercalcemia. Oncology evaluation and PET scan are scheduled this week to assess bone involvement and guide treatment. - Attend oncology appointment - Undergo PET scan  Hypophosphatemia Phosphorus levels require monitoring due to its importance in bone health, especially with suspected multiple myeloma and hypercalcemia. Phosphorus supplementation is ongoing. - Monitor phosphorus levels at next appointment with PCP - Continue phosphorus supplementation for now as instructed  Hypertension Hydralazine administered in hospital, requiring dosing every 8 hours. Losartan continued. Hydralazine preferred due to lack of fluid retention, beneficial for current fluid status. She has questions of how she should take this medication and why she has to take it 3x per day. She has not started it yet. - Continue losartan - Administer hydralazine  every 8 hours -Questions answered in depth to her satisfaction   Edema 2 + bilateral ankle swelling likely from fluid overload post-IV fluids and discontinuation of hydrochlorothiazide. She is very concerned about this and has questions of why she cannot take her hydrochlorothiazide anymore. Alternative diuretics may be explored to manage edema without affecting calcium levels. -Questions on why hydrochlorothiazide cannot be continued answered today to the patients satisfaction - Discuss potential for alternative diuretics with primary care physician - Monitor edema reduction as fluid balance normalizes  Obesity Wegovy used for obesity management. Significant weight loss noted but concerning due to dehydration and hospitalization. Focus on stabilizing health before resuming Wegovy, pending primary care consultation. - Consult primary care physician and oncology about her thoughts on resuming Wegovy - Monitor nutritional intake, additional weight loss may not be advisable while undergoing treatment. -Work on eating everything on her balanced Category 2 plan, may need to adjust  based on diagnosis and reaction to treatment.  Prediabetes Well-controlled with metformin 500 mg daily. Hospital staff questioned need for further intervention. Discussion on blood sugar monitoring with primary care physician planned. - Continue metformin 500 mg daily - Discuss blood sugar monitoring with primary care physician  General Health Maintenance Advised to avoid vitamin D supplementation due to hypercalcemia. Encouraged reasonable but not excessive milk consumption for natural calcium intake without supplements. - Avoid vitamin D supplementation - Consume up to two glasses of milk daily unless advised otherwise by oncology  Follow-up Multiple follow-up appointments scheduled for ongoing health issues and treatment reassessment, including primary care, oncology, and PET scan. - Follow up with primary care  physician tomorrow - Attend PET scan on April 10th - Follow up with oncologist on April 10th - Schedule follow-up with family medicine in 4-6 weeks     I have personally spent 62 minutes total time today reviewing hospital stay, admission and discharge notes, labs and test pending and resulted. In addition, much of the visit was spent answering Carla Hanson's numerous questions about her hospitalization and future treatment plans as documented in the above note. Documentation of this note was also added to the total time of the visit   She was informed of the importance of frequent follow up visits to maximize her success with intensive lifestyle modifications for her multiple health conditions.    Quillian Quince, MD

## 2024-02-10 ENCOUNTER — Telehealth: Payer: Self-pay

## 2024-02-10 ENCOUNTER — Ambulatory Visit: Admitting: Family Medicine

## 2024-02-10 ENCOUNTER — Encounter: Payer: Self-pay | Admitting: Family Medicine

## 2024-02-10 ENCOUNTER — Other Ambulatory Visit (HOSPITAL_COMMUNITY): Payer: Self-pay

## 2024-02-10 VITALS — BP 166/93 | HR 86 | Wt 274.8 lb

## 2024-02-10 DIAGNOSIS — R269 Unspecified abnormalities of gait and mobility: Secondary | ICD-10-CM | POA: Diagnosis not present

## 2024-02-10 DIAGNOSIS — C9000 Multiple myeloma not having achieved remission: Secondary | ICD-10-CM

## 2024-02-10 DIAGNOSIS — E782 Mixed hyperlipidemia: Secondary | ICD-10-CM | POA: Diagnosis not present

## 2024-02-10 DIAGNOSIS — I10 Essential (primary) hypertension: Secondary | ICD-10-CM

## 2024-02-10 DIAGNOSIS — Z1231 Encounter for screening mammogram for malignant neoplasm of breast: Secondary | ICD-10-CM | POA: Diagnosis not present

## 2024-02-10 DIAGNOSIS — R600 Localized edema: Secondary | ICD-10-CM | POA: Diagnosis not present

## 2024-02-10 DIAGNOSIS — E611 Iron deficiency: Secondary | ICD-10-CM | POA: Diagnosis not present

## 2024-02-10 DIAGNOSIS — Z6841 Body Mass Index (BMI) 40.0 and over, adult: Secondary | ICD-10-CM

## 2024-02-10 DIAGNOSIS — D509 Iron deficiency anemia, unspecified: Secondary | ICD-10-CM

## 2024-02-10 DIAGNOSIS — E878 Other disorders of electrolyte and fluid balance, not elsewhere classified: Secondary | ICD-10-CM

## 2024-02-10 NOTE — Assessment & Plan Note (Addendum)
 Lab reviewed Xray done in the hospital showing bone lucency reviewed as well as orthopedic's recs She is plugged in with oncology and scheduled for PET scan Continue HHPT I ordered bath tub DME

## 2024-02-10 NOTE — Patient Instructions (Signed)
 Blood Pressure Record Sheet To take your blood pressure, you will need a blood pressure machine. You may be prescribed one, or you can buy a blood pressure machine (blood pressure monitor) at your clinic, drug store, or online. When choosing one, look for these features: An automatic monitor that has an arm cuff. A cuff that wraps snugly, but not too tightly, around your upper arm. You should be able to fit only one finger between your arm and the cuff. A device that stores blood pressure reading results. Do not choose a monitor that measures your blood pressure from your wrist or finger. Follow your health care provider's instructions for how to take your blood pressure. To use this form: Get one reading in the morning (a.m.) before you take any medicines. Get one reading in the evening (p.m.) before supper. Take at least two readings with each blood pressure check. This makes sure the results are correct. Wait 1-2 minutes between measurements. Write down the results in the spaces on this form. Repeat this once a week, or as told by your health care provider. Make a follow-up appointment with your health care provider to discuss the results. Blood pressure log Date: _______________________ a.m. _____________________(1st reading) _____________________(2nd reading) p.m. _____________________(1st reading) _____________________(2nd reading) Date: _______________________ a.m. _____________________(1st reading) _____________________(2nd reading) p.m. _____________________(1st reading) _____________________(2nd reading) Date: _______________________ a.m. _____________________(1st reading) _____________________(2nd reading) p.m. _____________________(1st reading) _____________________(2nd reading) Date: _______________________ a.m. _____________________(1st reading) _____________________(2nd reading) p.m. _____________________(1st reading) _____________________(2nd reading) Date:  _______________________ a.m. _____________________(1st reading) _____________________(2nd reading) p.m. _____________________(1st reading) _____________________(2nd reading) This information is not intended to replace advice given to you by your health care provider. Make sure you discuss any questions you have with your health care provider. Document Revised: 07/05/2021 Document Reviewed: 07/05/2021 Elsevier Patient Education  2024 [employer].

## 2024-02-10 NOTE — Telephone Encounter (Signed)
 Community message sent to [employer] for Tub bench. Will await response.   Veronda Prude, RN

## 2024-02-10 NOTE — Assessment & Plan Note (Addendum)
 Likely due to Multiple myeloma Lab report reviewed Repeat levels today

## 2024-02-10 NOTE — Assessment & Plan Note (Addendum)
 Off Wegovy  She wants to know if she can be on this medication with multiple myeloma. As discussed with her, although Wegovy  increased the risk of MENS and medullary thyroid  cancer, there is no official contraindication to the use of Wegovy  with multiple myeloma, as patients with this condition were not included in the original study. Caution needs to be taken if she were to get back on this medication. She will follow up with weight management to discuss risk-benefit further.

## 2024-02-10 NOTE — Progress Notes (Addendum)
 SUBJECTIVE:   CHIEF COMPLAINT / HPI:   Hospital follow up/Hypercalcemia: The patient was recently discharged from the hospital for Hypercalcemia after being found to have severe Hypercalcemia.  She has been doing much better since discharge from the hospital. She has no new concerns today. She has HHPT and an oncology appointment scheduled. She has PET scan scheduled as well. She requested bath tub bench DME completion.   HTN: Her HCTZ was d/ced due to hypercalcemia. She is compliant with Losartan  50 mg QD and has started taking Hydralazine  50 mg TID consistently since yesterday. Her home BP read is in the 130s/70s.   Leg edema: Gradually worsened since she stopped taking her HCTZ. Denies leg pain or redness.   Anemia/HLD: No new concern. She is compliant with her meds. She is here for f/u.  Weight management: Off Wegovy . She wanted to know if she could get back on it.   PERTINENT  PMH / PSH: PMHx reviewed  OBJECTIVE:   Vitals:   02/10/24 1130 02/10/24 1146 02/10/24 1149  BP: 139/80 (!) 153/88 (!) 166/93  Pulse:  86   SpO2:  97%   Weight: 274 lb 12.8 oz (124.6 kg)       Physical Exam Vitals and nursing note reviewed.  Cardiovascular:     Rate and Rhythm: Normal rate and regular rhythm.     Comments: Grade soft 1-2/6 systolic murmur hear over the right sternal border Pulmonary:     Effort: Pulmonary effort is normal. No respiratory distress.     Breath sounds: Normal breath sounds. No wheezing.  Musculoskeletal:     Right lower leg: Edema present.     Left lower leg: Edema present.     Comments: B/L LL ++ pitting edema from her feet, to a few inches below her knees      ASSESSMENT/PLAN:   Assessment & Plan Electrolyte abnormality Hospital lab reviewed. Abnormally low Mg, K, and phos She completed 4 day course of Phos Lab repeated today Hypercalcemia of malignancy Likely due to Multiple myeloma Lab report reviewed Repeat levels today Multiple myeloma  not having achieved remission (HCC) Lab reviewed Xray done in the hospital showing bone lucency reviewed as well as orthopedic's recs She is plugged in with oncology and scheduled for PET scan Continue HHPT I ordered bath tub DME Mixed hyperlipidemia FLP checked today Essential hypertension Per the patient her home BP readings are good. Her initial manual BP read was good Repeat automated machine measured were elevated No change in regimen planned today Continue home BP monitoring and return in 2 weeks with home read Come in sooner if BP remains elevated She agreed with the plan Appointment made Edema leg Multifactorial Recently taken of HCTZ, has hypoalbuminemia DVT less likely ?? venous insufficiency ?? HF - most recent ECHO from 2023 was essentially normal - will discuss repeat ECHO at her next visit Recent lab is thankfully negative for renal or liver impairment Discussed initiating Lasix , which will be beneficial for her hypercalcemia but not for hypokalemia We would like to wait on lab results and assess BP stability while on Losartan  and Hydralazine  Option discussed to completely d/c Hydralazine  while initiating Lasix  with/without K+ supplementation We will discuss once her electrolyte test results are available For now, we will continue conservative measures Iron deficiency anemia, unspecified iron deficiency anemia type Anemia panel checked BMI 40.0-44.9, adult (HCC) Off Wegovy  She wants to know if she can be on this medication with multiple myeloma. As discussed with  her, although Wegovy  increased the risk of MENS and medullary thyroid  cancer, there is no official contraindication to the use of Wegovy  with multiple myeloma, as patients with this condition were not included in the original study. Caution needs to be taken if she were to get back on this medication. She will follow up with weight management to discuss risk-benefit further.     Cancer screening. Advised  to schedule mammogram appointment. Order placed Hx of tobacco use. Does not qualify for lung cancer screening. As discussed with her, since she is undergoing evaluation by oncology for cancer screen, I will defer this to them as well.   More than 50% of this 40-minute face-to-face encounter was spent reviewing records, providing direct patient care, counseling, and coordinating care.  Penni Bowman, MD [facility]

## 2024-02-10 NOTE — Assessment & Plan Note (Addendum)
 Hospital lab reviewed. Abnormally low Mg, K, and phos She completed 4 day course of Phos Lab repeated today

## 2024-02-10 NOTE — Telephone Encounter (Signed)
 Receipt confirmed by [employer].   Veronda Prude, RN

## 2024-02-10 NOTE — Assessment & Plan Note (Addendum)
 Multifactorial Recently taken of HCTZ, has hypoalbuminemia DVT less likely ?? venous insufficiency ?? HF - most recent ECHO from 2023 was essentially normal - will discuss repeat ECHO at her next visit Recent lab is thankfully negative for renal or liver impairment Discussed initiating Lasix, which will be beneficial for her hypercalcemia but not for hypokalemia We would like to wait on lab results and assess BP stability while on Losartan and Hydralazine Option discussed to completely d/c Hydralazine while initiating Lasix with/without K+ supplementation We will discuss once her electrolyte test results are available For now, we will continue conservative measures

## 2024-02-10 NOTE — Assessment & Plan Note (Addendum)
Anemia panel checked.

## 2024-02-10 NOTE — Assessment & Plan Note (Addendum)
 Per the patient her home BP readings are good. Her initial manual BP read was good Repeat automated machine measured were elevated No change in regimen planned today Continue home BP monitoring and return in 2 weeks with home read Come in sooner if BP remains elevated She agreed with the plan Appointment made

## 2024-02-10 NOTE — Assessment & Plan Note (Addendum)
FLP checked today. 

## 2024-02-11 ENCOUNTER — Other Ambulatory Visit: Payer: Self-pay | Admitting: Family Medicine

## 2024-02-11 ENCOUNTER — Other Ambulatory Visit (HOSPITAL_COMMUNITY): Payer: Self-pay

## 2024-02-11 ENCOUNTER — Telehealth: Payer: Self-pay | Admitting: Family Medicine

## 2024-02-11 ENCOUNTER — Other Ambulatory Visit: Payer: Self-pay

## 2024-02-11 DIAGNOSIS — I10 Essential (primary) hypertension: Secondary | ICD-10-CM

## 2024-02-11 DIAGNOSIS — C9000 Multiple myeloma not having achieved remission: Secondary | ICD-10-CM

## 2024-02-11 DIAGNOSIS — E878 Other disorders of electrolyte and fluid balance, not elsewhere classified: Secondary | ICD-10-CM

## 2024-02-11 LAB — PTH-RELATED PEPTIDE: PTH-related peptide: <2 pmol/L

## 2024-02-11 MED ORDER — PHOSPHORUS SUPPLEMENT 280-160-250 MG PO PACK
1.0000 | PACK | Freq: Two times a day (BID) | ORAL | 0 refills | Status: AC
Start: 2024-02-11 — End: 2024-02-15
  Filled 2024-02-11: qty 6, 3d supply, fill #0

## 2024-02-11 MED ORDER — LOSARTAN POTASSIUM 50 MG PO TABS
50.0000 mg | ORAL_TABLET | Freq: Every day | ORAL | 0 refills | Status: DC
  Filled 2024-02-11: qty 90, 90d supply, fill #0

## 2024-02-11 NOTE — Progress Notes (Signed)
 HEMATOLOGY/ONCOLOGY CONSULTATION NOTE  Date of Service: 02/12/2024  Patient Care Team: Arn Lane, MD as PCP - General (Family Medicine) Lisabeth Rider, MD as Consulting Physician (Neurology) Dorothe Gaster, RD as Dietitian (Family Medicine) Frankie Israel, MD as Consulting Physician (Hematology)  CHIEF COMPLAINTS/PURPOSE OF CONSULTATION:  Multiple myeloma  HISTORY OF PRESENTING ILLNESS:  Carla Hanson is a wonderful 58 y.o. 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   Patient has a history of hypertension, dyslipidemia, COPD, history of PE/DVT, prediabetes, obesity who notes that she was working out in the gym and had significant back pain resulting in an x-ray that showed T6 compression fracture in January 2025.   She had a follow-up with [facility] weight [facility] wellness clinic on 3/27 and reported symptoms of significant weakness dizziness lightheadedness and dehydration despite drinking a lot of water.  She also noted nausea but no emesis and constipation. Patient was seen in the emergency room and noted to have severe hypercalcemia with a calcium level of 17.8 with an albumin of 3.8, total protein was elevated at 9.1 with an increased globulin gap, potassium was 2.7. 25-hydroxy vitamin D levels were within normal limits at 66.9. TSH was normal at 0.736. PTH levels were appropriately suppressed at 6 which is suggestive of non hyperparathyroid hypercalcemia. CBC-on admission showed normal hemoglobin of 13.1 in the context of significant hemoconcentration.  This might be hiding her baseline anemia which might declare itself once the patient's volume status is replaced.  No other cytopenias. Patient notes no acute new focal bone pains.   Patient notes no fevers no chills no night sweats. No previous history of cancers. Her last mammogram was on 02/07/2023 and was within normal limits.  She is  previously noted to have a benign lipoma in the right breast which was resected. Patient has been ex-smoker and smoked half to 1 pack a day for 35 years and quit in December 2017.   New focal symptoms. Patient has received 1 dose of Zometa and IV fluids and calcium levels today are down to 12 with a corrected calcium between 13 and 14. She is receiving IV fluids potassium replacement phosphorus replacement and calcitonin today.   Normal labs have been ordered and patient is pending whole-body skeletal survey.  24-hour urine collection for myeloma has been sent out.   INTERVAL HISTORY:  Carla Hanson is a 58 y.o. female here for hospital follow-up for evaluation and management of multiple myeloma.   Today she presents in a wheelchair and is accompanied by her mother.  Patent presented to the hospital with very high calcium levels of 17.8, which had normalized by the time she was discharged. She was treated with 4 days of high-dose steroids as well as Zometa.   She reports that she has been feeling better, eating well, and has been staying well-hydrated since her recent hospitalization.   Patient reports that her allergies have been bothersome and she does use a nebulizer. She does endorse wheezing sometimes.   Patient denies any chest wall pain, hip pain, or shoulder pain. She reports that her back pain from T6 vertebral compression fracture has settled down and has occasional mild stiffness at this time.   Patient complains of headaches from her blood pressure medication, and denies any concern for headaches otherwise.   She reports having a hx of lung and leg blood clots years ago. She  was taking Xaretlo. Patient was smoking at the time. She does not smoke cigarettes at this time. Patient denies any concern for neuropathy. She reports a hx of brain bleed from blood thinners.   She is due for mammogram which she has not yet scheduled. Patient denies any breast symptoms.   She is not  taking vitamin D supplementation. Patient reports that her PCP put her on phosphorous for 2-3 days.   She reports that the generally stays regularly physically active and enjoys going to the gym.  Patient regularly engages in at-home physical therapy.   She has never received the shingle vaccine and denies any hx of shingles.   Patient reports bilateral leg swelling.   She denies any dental issues in the past.   MEDICAL HISTORY:  Past Medical History:  Diagnosis Date   Alcohol abuse    Anxiety    Back pain    Bipolar disorder (HCC)    Bronchitis    COPD exacerbation (HCC) 05/10/2016   Drug use    History of blood clots    Hyperlipidemia    Hypertension    Influenza A 12/01/2018   Joint pain    Left leg DVT (HCC) 09/29/2013   Lipoma    Abdomen   LIPOMA 01/20/2008   Obesity    Occasional tremors 05/12/2019   Osteoarthritis of right knee    Other fatigue    Painful menstrual periods 01/05/2012   Pneumonia    Prediabetes    Seizure (HCC)    Shortness of breath on exertion    Stroke (HCC)    Tobacco abuse     SURGICAL HISTORY: Past Surgical History:  Procedure Laterality Date   CESAREAN SECTION     CYSTECTOMY     LIPOMA EXCISION  03/2011   ORIF ANKLE FRACTURE  03/13/2012   Procedure: OPEN REDUCTION INTERNAL FIXATION (ORIF) ANKLE FRACTURE;  Surgeon: Alphonso Jean, MD;  Location: [facility];  Service: Orthopedics;  Laterality: Right;   TUBAL LIGATION      SOCIAL HISTORY: Social History   Socioeconomic History   Marital status: Divorced    Spouse name: Not on file   Number of children: 4   Years of education: 10   Highest education level: Not on file  Occupational History   Occupation: stay at home  Tobacco Use   Smoking status: Former    Current packs/day: 0.00    Average packs/day: 0.5 packs/day for 35.0 years (17.5 ttl pk-yrs)    Types: Cigarettes    Start date: 11/04/1981    Quit date: 10/2016    Years since quitting: 7.3   Smokeless tobacco: Never  Vaping Use    Vaping status: Never Used  Substance and Sexual Activity   Alcohol use: No    Alcohol/week: 0.0 standard drinks of alcohol    Comment: Quit 10/2016   Drug use: Yes    Types: Marijuana   Sexual activity: Not Currently    Comment: tubal  Other Topics Concern   Not on file  Social History Narrative   Unemployed, 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  Considering nursing.    Lives at home alone   Right-handed   Caffeine: not much   Social Drivers of Corporate investment banker Strain: Low Risk  (11/25/2023)   Overall Financial Resource Strain (CARDIA)    Difficulty of Paying Living Expenses: Not hard at all  Food Insecurity:  No Food Insecurity (02/06/2024)   Hunger Vital Sign    Worried About Running Out of Food in the Last Year: Never true    Ran Out of Food in the Last Year: Never true  Transportation Needs: No Transportation Needs (02/06/2024)   PRAPARE - Administrator, Civil Service (Medical): No    Lack of Transportation (Non-Medical): No  Physical Activity: Sufficiently Active (11/25/2023)   Exercise Vital Sign    Days of Exercise per Week: 5 days    Minutes of Exercise per Session: 60 min  Stress: No Stress Concern Present (11/25/2023)   [employer] of Occupational Health - Occupational Stress Questionnaire    Feeling of Stress : Not at all  Social Connections: Moderately Integrated (01/31/2024)   Social Connection and Isolation Panel [NHANES]    Frequency of Communication with Friends and Family: Three times a week    Frequency of Social Gatherings with Friends and Family: Once a week    Attends Religious Services: More than 4 times per year    Active Member of [employer] or Organizations: Yes    Attends Banker Meetings: Patient declined    Marital Status: Divorced  Catering manager Violence: Not At Risk (02/06/2024)   Humiliation, Afraid, Rape, and Kick  questionnaire    Fear of Current or Ex-Partner: No    Emotionally Abused: No    Physically Abused: No    Sexually Abused: No    FAMILY HISTORY: Family History  Problem Relation Age of Onset   Other Mother        High blood pressure runs in the family   Obesity Mother    Asthma Father    Heart attack Father    Obesity Father    Diabetes Maternal Aunt    Breast cancer Neg Hx     ALLERGIES:  is allergic to latuda [lurasidone].  MEDICATIONS:  Current Outpatient Medications  Medication Sig Dispense Refill   acetaminophen (TYLENOL) 325 MG tablet Take 650 mg by mouth every 6 (six) hours as needed for mild pain (pain score 1-3). (Patient not taking: Reported on 02/10/2024)     albuterol (PROVENTIL) (2.5 MG/3ML) 0.083% nebulizer solution Take 3 mLs (2.5 mg total) by nebulization every 6 (six) hours as needed for wheezing or shortness of breath. (Patient not taking: Reported on 02/10/2024) 75 mL 2   albuterol (VENTOLIN HFA) 108 (90 Base) MCG/ACT inhaler Inhale 2 puffs into the lungs every 6 (six) hours as needed for wheezing or shortness of breath. (Patient not taking: Reported on 02/10/2024) 18 g 4   aspirin EC 81 MG EC tablet Take 1 tablet (81 mg total) by mouth daily. 30 tablet 12   cetirizine (ZYRTEC) 10 MG tablet Take 1 tablet (10 mg total) by mouth daily. 90 tablet 1   Elastic Bandages & Supports (MUELLER ADJUSTABLE BACK BRACE) MISC Use daily to support thoracic spine 1 each 0   ferrous sulfate 325 (65 FE) MG tablet Take 325 mg by mouth daily with breakfast.     hydrALAZINE (APRESOLINE) 50 MG tablet Take 1 tablet (50 mg total) by mouth every 8 (eight) hours. 180 tablet 0   losartan (COZAAR) 50 MG tablet Take 1 tablet (50 mg total) by mouth daily. 90 tablet 0   metFORMIN (GLUCOPHAGE) 500 MG tablet Take 1 tablet (500 mg total) by mouth daily with breakfast.     Potassium & Sodium Phosphates (PHOSPHORUS SUPPLEMENT) 280-160-250 MG PACK Take 1 tablet by mouth 2 (two) times daily  for 3 days. 6  each 0   rosuvastatin (CRESTOR) 20 MG tablet TAKE 1 TABLET(20 MG) BY MOUTH DAILY 90 tablet 1   Semaglutide-Weight Management (WEGOVY) 0.5 MG/0.5ML SOAJ Inject 0.5 mg into the skin once a week. (Patient not taking: Reported on 02/10/2024) 2 mL 0   tiZANidine (ZANAFLEX) 4 MG tablet Take 1 tablet (4 mg total) by mouth 3 times daily as needed-between meals & bedtime for muscle spasms. (Patient not taking: Reported on 02/10/2024) 60 tablet 1   No current facility-administered medications for this visit.    REVIEW OF SYSTEMS:    10 Point review of Systems was done is negative except as noted above.  PHYSICAL EXAMINATION: ECOG PERFORMANCE STATUS: {CHL ONC ECOG WJ:[id number]}  .There were no vitals filed for this visit. There were no vitals filed for this visit. .There is no height or weight on file to calculate BMI.  GENERAL:alert, in no acute distress and comfortable SKIN: no acute rashes, no significant lesions EYES: conjunctiva are pink and non-injected, sclera anicteric OROPHARYNX: MMM, no exudates, no oropharyngeal erythema or ulceration NECK: supple, no JVD LYMPH:  no palpable lymphadenopathy in the cervical, axillary or inguinal regions LUNGS: clear to auscultation b/l with normal respiratory effort HEART: regular rate & rhythm ABDOMEN:  normoactive bowel sounds , non tender, not distended. Extremity: no pedal edema PSYCH: alert & oriented x 3 with fluent speech NEURO: no focal motor/sensory deficits  LABORATORY DATA:  I have reviewed the data as listed  .    Latest Ref Rng & Units 02/10/2024   12:01 PM 02/05/2024    5:01 AM 02/04/2024    5:05 AM  CBC  WBC 3.4 - 10.8 x10E3/uL 7.7  9.0  8.6   Hemoglobin 11.1 - 15.9 g/dL 21.3  08.6  57.8   Hematocrit 34.0 - 46.6 % 34.7  34.1  34.6   Platelets 150 - 450 x10E3/uL 342  302  306     .    Latest Ref Rng & Units 02/10/2024   12:01 PM 02/04/2024    5:05 AM 02/03/2024    5:07 AM  CMP  Glucose 70 - 99 mg/dL 76  469  629   BUN 6 - 24  mg/dL 11  28  26    Creatinine 0.57 - 1.00 mg/dL 5.28  4.13  2.44   Sodium 134 - 144 mmol/L 145  136  137   Potassium 3.5 - 5.2 mmol/L 3.6  3.7  3.1   Chloride 96 - 106 mmol/L 105  108  108   CO2 20 - 29 mmol/L 19  23  23    Calcium 8.7 - 10.2 mg/dL 8.2  8.4  9.1   Total Protein 6.0 - 8.5 g/dL 7.6  7.5  7.4   Total Bilirubin 0.0 - 1.2 mg/dL 0.2  0.4  <0.1   Alkaline Phos 44 - 121 IU/L 90  41  39   AST 0 - 40 IU/L 19  32  15   ALT 0 - 32 IU/L 43  38  14    02/05/2024 Bone Marrow Biopsy:       RADIOGRAPHIC STUDIES: I have personally reviewed the radiological images as listed and agreed with the findings in the report. CT BONE MARROW BIOPSY & ASPIRATION Result Date: 02/05/2024 INDICATION: [id number] Multiple myeloma (HCC) [phone] EXAM: CT GUIDED BONE MARROW ASPIRATION AND CORE BIOPSY MEDICATIONS: 25 mg Benadryl IV ANESTHESIA/SEDATION: Moderate (conscious) sedation was employed during this procedure. A total of Versed 4 mg and Fentanyl  100 mcg was administered intravenously. Moderate Sedation Time: 50 minutes. The patient's level of consciousness and vital signs were monitored continuously by radiology nursing throughout the procedure under my direct supervision. FLUOROSCOPY TIME:  CT dose; 219 mGycm COMPLICATIONS: None immediate. Estimated blood loss: <5 mL PROCEDURE: RADIATION DOSE REDUCTION: This exam was performed according to the departmental dose-optimization program which includes automated exposure control, adjustment of the mA and/or kV according to patient size and/or use of iterative reconstruction technique. Informed written consent was obtained from the patient after a thorough discussion of the procedural risks, benefits and alternatives. All questions were addressed. Maximal Sterile Barrier Technique was utilized including caps, mask, sterile gowns, sterile gloves, sterile drape, hand hygiene and skin antiseptic. A timeout was performed prior to the initiation of the procedure. The patient  was positioned prone and non-contrast localization CT was performed of the pelvis to demonstrate the iliac marrow spaces. Under sterile conditions and local anesthesia, an 11 gauge coaxial bone biopsy needle was advanced into the RIGHT iliac marrow space. Needle position was confirmed with CT imaging. Initially, bone marrow aspiration was performed. Next, the 11 gauge outer cannula was utilized to obtain a 1 iliac bone marrow core biopsy. Needle was removed. Hemostasis was obtained with compression. The patient tolerated the procedure well. Samples were prepared with the cytotechnologist. IMPRESSION: Successful CT-guided bone marrow aspiration and biopsy. Art Largo, MD [facility] Electronically Signed   By: Art Largo M.D.   On: 02/05/2024 17:10   DG HIP UNILAT WITH PELVIS 2-3 VIEWS LEFT Result Date: 02/03/2024 CLINICAL DATA:  Pain after injury.  History of myeloma. EXAM: DG HIP (WITH OR WITHOUT PELVIS) 3V LEFT COMPARISON:  X-ray 02/02/2024 FINDINGS: Today's examination is more under penetrated than previous. The subtle lucent lesions are again noted diffusely along the pelvis and proximal femurs consistent with known history of myeloma. Slight joint space loss of the right hip. No fracture or dislocation. IMPRESSION: Multiple lucent lesions as per prior examination. Patient has provided history of multiple myeloma. Electronically Signed   By: Adrianna Horde M.D.   On: 02/03/2024 19:25   DG Bone Survey Met Result Date: 02/02/2024 CLINICAL DATA:  Multiple myeloma EXAM: METASTATIC BONE SURVEY COMPARISON:  01/30/2024 FINDINGS: Standard bone survey was performed: Lateral skull: Multiple rounded lucent lesions are seen throughout the calvarium, greatest in the frontal and parietal regions, consistent with history of multiple myeloma. Upper extremities: Frontal views are obtained from the shoulders through the wrists. Multiple small faint rounded lucent  lesions are seen within the proximal humeri bilaterally consistent with multiple myeloma. No acute fractures. Spine: Frontal and lateral views are obtained. The T6 compression deformity seen previously is again noted and unchanged. There are no new fractures. No destructive bony lesions are identified. Mild multilevel thoracolumbar spondylosis. Chest: Frontal view of the chest demonstrates an unremarkable cardiac silhouette. No airspace disease, effusion, or pneumothorax. Stable bibasilar scarring. There is a lytic lesion with pathologic fracture in the left posterolateral ninth rib compatible with multiple myeloma. No other acute bony abnormalities. Pelvis: Frontal view of the pelvis including both hips demonstrates multiple rounded lucent lesions within the bilateral iliac bones and right inferior pubic ramus. Rounded lucent lesions are also seen within the intertrochanteric region of the proximal left femur. Findings are compatible with multiple myeloma. No pathologic fracture. Lower extremities: Frontal views are obtained from the hips through the ankles. Multiple small rounded lucent lesions are seen within the intertrochanteric left hip, bilateral femoral diaphyses,  and right femoral metadiaphyseal region. Indeterminate lucency within the distal fibular diaphysis just proximal to prior ORIF hardware. IMPRESSION: 1. Multiple rounded lucent lesions within the calvarium, left ninth rib, bilateral shoulders, pelvis, and bilateral lower extremities consistent with history of multiple myeloma. 2. Pathologic fracture within the left posterolateral ninth rib at the site of myeloma lesion. Electronically Signed   By: Sharlet Salina M.D.   On: 02/02/2024 15:01   DG Chest Port 1 View Result Date: 01/30/2024 CLINICAL DATA:  Hypercalcemia. EXAM: CHEST - 2 VIEW COMPARISON:  11/17/2023 FINDINGS: Bibasilar atelectasis or scarring. Otherwise no focal consolidation, pleural effusion, or pneumothorax. Normal cardiomediastinal  silhouette. No displaced rib fractures. Thoracic spondylosis. IMPRESSION: Bibasilar atelectasis/scarring.  No active cardiopulmonary disease. Electronically Signed   By: Minerva Fester M.D.   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 Patient has a globulin gap and Anemia and bone survey shows multiple bone lesions, Overall presentation is certainly concerning for multiple myeloma. Other paraneoplastic hypercalcemia is also possibility. 25-hydroxy vitamin D levels are within normal limits at 66. Status post 1 dose of Zometa   #2 dehydration and volume contraction due to hypercalcemia.   #3  Hypertension #4 dyslipidemia #5 COPD #6 prediabetes #7 multiple electrolyte abnormalities including hypokalemia, hypophosphatemia and severe hypercalcemia. #8 multiple osseous lesions concerning for multiple myeloma.  Also noted to have left femoral intertrochanteric lesion. #9 history of T6 compression fracture was likely related to multiple myeloma.  Mild pain at this time.  PLAN:  -Discussed lab results from 02/10/2024 in detail with patient. CBC showed WBC of 7.7K, hemoglobin of 11.3, and platelets of 342K. -patient presented with bone tumor, high calcium 17.8, plenty of protein in the urine, and mild anemia -Patient's bone survey showed extensive bone lesions concerning for multiple myeloma. -discussed that her 02/05/2024 bone marrow biopsy is consistent with multiple myeloma with 10% abnormal plasma cells -there is nearly 2.5 g of abnormal proteins made by abnormal plasma cells growing in the bone marrow -discussed that it is possible that treatment of 4 days of high-dose steroids during  her hospitalization may have caused her to show less plasma cells on bone marrow biopsy than what she may have started with  -discussed that there is a role to treat myeloma to ensure that there is no concern for bone tumor growth, elevated calcium, drop in blood counts, or kidney problems -discussed that her condition is very treatable, and she could go into remission for long periods of time, though it is not curable -patient does not have any irriversible damage from myeloma at this time -her genetics on the bone marrow are pending at this time, which would show if there are any expectations of resistance to treatment and how myleoma may behave -once her genetic testing results, there may be a role to make changes to her treatment plan, which will need a discussion at that time -Discussed that her treatment would involve 3-4 targeted therapy medications: Dexamethasone once a week Daratumumab infusion into the skin, which is generally tolerated well. There is a risk of allergic reactions, though she would be given premedications for prevention Velcade injection into the skin -will not use Revlimid at this time given her  hx of blood clots -educated patient on details of treatment. Discussed that each cycle will be 3-4 weeks and there are 4-6 cycles as part of induction treatment. Goal of treatment is to have a 90% reduction of cancer cells. We would then switch to maintenace treatment, which will require a discussion at that time -will set up patient for education counseling session with chemotherapy nurses to discuss details of treatment -though uncommon, potential side effects from treatment include skin rash, jitteriness, sweats, and SOB -discussed that premedications with treatment to reduce the risk of allergic reactions form shots may include tylenol, antihistamines, dexamethasone, anti-nausea medication, etc -will plan to start treatment 1-2 weeks -CA 27.29 269.5 U/mL -discussed that one of  her tumor marker proteins was elevated, which could be false due to myeloma potentially making unusual protein causing false positive results on blood test - we would need to confirm with mammogram -schedule mammogram -will order acyclovir to reduce the risk of shingles -there is a role for Zometa IV infusion once a month to reduce calcium in the blood and to strengthen the bones and reduce the risk of fractures -recommend dental clearance before starting zometa. Discussed that if there is any need for invasive dental procedure, it would need to be completed 1-2 months before and after zometa as it can delay healing.  -educated patient that vitamin D controls how much phosphorous we absorb from out diet   -take 5000 units of vitamin D3 once daily -phosphorous nearly normal at 2.6 mg/dL -discussed that phosphorous is generally needed for muscle activity and low levels may cause fatigue -discussed that most individuals do not need phosphorous replacement long-term -continue at-home physical therapy. Once home care services are finished and if she still needs help, there would be the option of outpatient physical therapy -patient has some fluid retention from receiving IV fluid during her recent hospitalization -her bilateral lower extremity edema should settle down as she continues to move around and eat better. There may be a role for low-dose diuretic if needed.  -patient has a PET scan scheduled for later today -discussed that in general, myeloma can be caused by exposure to different chemicals/radiation, though it would be difficult to determine the exact cause in a given case -discussed that myeloma is commonly diagnosed in age populations of 29s, 75s, and older and there are also starting to be several cancers in younger individuals, which is primarily driven by ultra-processed foods. -answered all of patient's and her family member's questions in detail  FOLLOW-UP: ***  The total time spent  in the appointment was *** minutes* .  All of the patient's questions were answered with apparent satisfaction. The patient knows to call the clinic with any problems, questions or concerns.   Jacquelyn Matt MD MS AAHIVMS [facility] Hematology/Oncology Physician [facility]  .*Total Encounter Time as defined by the Centers for Medicare and Medicaid Services includes, in addition to the face-to-face time of a patient visit (documented in the note above) non-face-to-face time: obtaining and reviewing outside history, ordering and reviewing medications, tests or procedures, care coordination (communications with other health care professionals or caregivers) and documentation in the medical record.    I,Mitra Faeizi,acting as a Neurosurgeon for Jacquelyn Matt, MD.,have documented all relevant documentation on the behalf of Jacquelyn Matt, MD,as directed by  Jacquelyn Matt, MD while in the presence of Jacquelyn Matt, MD.  ***

## 2024-02-11 NOTE — Telephone Encounter (Signed)
 I called and discussed the test result with the patient.  Hemoglobin wnl. Difficult to interpret anemia panel. It is likely a combination of iron deficiency and chronic disease. Continue Ferrous sulfate supplementation.  FLP wnl.  Mild hypernatremia. This is likely due to her phosphorus 155-852-130 MG supplement, which has a sodium level of 13 mEq. She already completed this supplement. Keep well hydrated and repeat lab in 1 week. Lab appointment made.   Corrected calcium mildly low at 8.4 > Monitor  ALT is slightly elevated. Monitor for now while avoiding/limiting hepatotoxic agents, NSAIDs, and Acetaminophen. We may consider adjusting Crestor in the future.  Magnesium pending; K+ is normal  Phos remains low, but improved. She should be done with a phosphorus 155-852-130 MG tablet, which consists of both disodium and monosodium components, which will increase her Na level. Switch to Phos 280/160/250, which only contains 160 of monosodium (i.e. 6 mEq Na) x 3 more days. Keep well hydrated. Repeat lab in 1 week.

## 2024-02-12 ENCOUNTER — Inpatient Hospital Stay: Attending: Hematology | Admitting: Hematology

## 2024-02-12 ENCOUNTER — Encounter (HOSPITAL_COMMUNITY)
Admission: RE | Admit: 2024-02-12 | Discharge: 2024-02-12 | Disposition: A | Discharge status: Home / Self Care | Source: Ambulatory Visit | Attending: Hematology | Admitting: Hematology

## 2024-02-12 VITALS — BP 174/74 | HR 89 | Temp 97.3°F | Resp 18 | Wt 276.2 lb

## 2024-02-12 DIAGNOSIS — C9000 Multiple myeloma not having achieved remission: Secondary | ICD-10-CM | POA: Diagnosis not present

## 2024-02-12 DIAGNOSIS — R918 Other nonspecific abnormal finding of lung field: Secondary | ICD-10-CM | POA: Diagnosis not present

## 2024-02-12 DIAGNOSIS — Z86718 Personal history of other venous thrombosis and embolism: Secondary | ICD-10-CM | POA: Insufficient documentation

## 2024-02-12 DIAGNOSIS — E876 Hypokalemia: Secondary | ICD-10-CM | POA: Diagnosis not present

## 2024-02-12 DIAGNOSIS — Z87891 Personal history of nicotine dependence: Secondary | ICD-10-CM | POA: Diagnosis not present

## 2024-02-12 DIAGNOSIS — R600 Localized edema: Secondary | ICD-10-CM | POA: Diagnosis not present

## 2024-02-12 DIAGNOSIS — E860 Dehydration: Secondary | ICD-10-CM | POA: Diagnosis not present

## 2024-02-12 LAB — GLUCOSE, CAPILLARY: Glucose-Capillary: 99 mg/dL (ref 70–99)

## 2024-02-12 MED ORDER — FLUDEOXYGLUCOSE F - 18 (FDG) INJECTION
13.8000 | Freq: Once | INTRAVENOUS | Status: AC
  Administered 2024-02-12: 13.72 via INTRAVENOUS

## 2024-02-13 ENCOUNTER — Other Ambulatory Visit: Payer: Self-pay

## 2024-02-13 ENCOUNTER — Telehealth: Payer: Self-pay

## 2024-02-13 NOTE — Transitions of Care (Post Inpatient/ED Visit) (Signed)
 Care Management  Transitions of Care Program Managed Medicaid Transitions of Care week 2  02/13/2024 Name: Carla Hanson MRN: [id number] DOB: 12-Feb-1966  Subjective: Carla Hanson is a 58 y.o. year old female who is a primary care patient of Doreene Eland, MD. The Care Management team was unable to reach the patient by phone to assess and address transitions of care needs.   Plan: Additional outreach attempts will be made to reach the patient enrolled in the [facility] Program (Post Inpatient/ED Visit).  Hilbert Odor RN, CCM [place]  VBCI-Population Health RN Care Manager [phone]

## 2024-02-16 ENCOUNTER — Other Ambulatory Visit: Payer: Self-pay

## 2024-02-16 ENCOUNTER — Encounter (HOSPITAL_COMMUNITY): Payer: Self-pay | Admitting: Hematology

## 2024-02-17 ENCOUNTER — Encounter: Payer: Self-pay | Admitting: Family Medicine

## 2024-02-17 ENCOUNTER — Telehealth: Payer: Self-pay

## 2024-02-17 LAB — ANEMIA PROFILE B
Basophils Absolute: 0 10*3/uL (ref 0.0–0.2)
Basos: 0 %
EOS (ABSOLUTE): 0.2 10*3/uL (ref 0.0–0.4)
Eos: 3 %
Ferritin: 384 ng/mL — ABNORMAL HIGH (ref 15–150)
Folate: 9.9 ng/mL (ref >3.0)
Hematocrit: 34.7 % (ref 34.0–46.6)
Hemoglobin: 11.3 g/dL (ref 11.1–15.9)
Immature Grans (Abs): 0 10*3/uL (ref 0.0–0.1)
Immature Granulocytes: 0 %
Iron Saturation: 11 % — ABNORMAL LOW (ref 15–55)
Iron: 27 ug/dL (ref 27–159)
Lymphocytes Absolute: 2.3 10*3/uL (ref 0.7–3.1)
Lymphs: 30 %
MCH: 27.3 pg (ref 26.6–33.0)
MCHC: 32.6 g/dL (ref 31.5–35.7)
MCV: 84 fL (ref 79–97)
Monocytes Absolute: 0.6 10*3/uL (ref 0.1–0.9)
Monocytes: 7 %
Neutrophils Absolute: 4.6 10*3/uL (ref 1.4–7.0)
Neutrophils: 60 %
Platelets: 342 10*3/uL (ref 150–450)
RBC: 4.14 x10E6/uL (ref 3.77–5.28)
RDW: 15 % (ref 11.7–15.4)
Retic Ct Pct: 1.4 % (ref 0.6–2.6)
Total Iron Binding Capacity: 245 ug/dL — ABNORMAL LOW (ref 250–450)
UIBC: 218 ug/dL (ref 131–425)
Vitamin B-12: 371 pg/mL (ref 232–1245)
WBC: 7.7 10*3/uL (ref 3.4–10.8)

## 2024-02-17 LAB — LIPID PANEL
Chol/HDL Ratio: 3.2 ratio (ref 0.0–4.4)
Cholesterol, Total: 143 mg/dL (ref 100–199)
HDL: 45 mg/dL (ref >39)
LDL Chol Calc (NIH): 77 mg/dL (ref 0–99)
Triglycerides: 113 mg/dL (ref 0–149)
VLDL Cholesterol Cal: 21 mg/dL (ref 5–40)

## 2024-02-17 LAB — CMP14+EGFR
ALT: 43 IU/L — ABNORMAL HIGH (ref 0–32)
AST: 19 IU/L (ref 0–40)
Albumin: 3.7 g/dL — ABNORMAL LOW (ref 3.8–4.9)
Alkaline Phosphatase: 90 IU/L (ref 44–121)
BUN/Creatinine Ratio: 18 (ref 9–23)
BUN: 11 mg/dL (ref 6–24)
Bilirubin Total: 0.2 mg/dL (ref 0.0–1.2)
CO2: 19 mmol/L — ABNORMAL LOW (ref 20–29)
Calcium: 8.2 mg/dL — ABNORMAL LOW (ref 8.7–10.2)
Chloride: 105 mmol/L (ref 96–106)
Creatinine, Ser: 0.61 mg/dL (ref 0.57–1.00)
Globulin, Total: 3.9 g/dL (ref 1.5–4.5)
Glucose: 76 mg/dL (ref 70–99)
Potassium: 3.6 mmol/L (ref 3.5–5.2)
Sodium: 145 mmol/L — ABNORMAL HIGH (ref 134–144)
Total Protein: 7.6 g/dL (ref 6.0–8.5)
eGFR: 104 mL/min/{1.73_m2} (ref >59)

## 2024-02-17 LAB — MAGNESIUM: Magnesium: 1.3 mg/dL — ABNORMAL LOW (ref 1.6–2.3)

## 2024-02-17 LAB — PHOSPHORUS: Phosphorus: 2.6 mg/dL — ABNORMAL LOW (ref 3.0–4.3)

## 2024-02-17 NOTE — Transitions of Care (Post Inpatient/ED Visit) (Signed)
 Transition of Care week 2  Visit Note  02/17/2024  Name: Carla Hanson MRN: [id number]          DOB: 10/28/1966  Situation: Patient enrolled in [facility] 30-day program. Visit completed with patient by telephone.   Background:patient enrolled in [facility] program 02/06/24 related to hospital discharge 02/05/24 for Hypercalcemia. TOC RN was unable to reach patient for scheduled 02/13/24 call. TOC RN reached paitent 02/17/24 and patient states she is working with her PCP and has been seen by oncology, has another PCP appointment 4/22 and is having blood work tomorrow, 02/18/24 and has Home Health PT coming. Patient denied need for additional TOC calls.   Initial Transition Care Management Follow-up Telephone Call    Past Medical History:  Diagnosis Date   Alcohol abuse    Anxiety    Back pain    Bipolar disorder (HCC)    Bronchitis    COPD exacerbation (HCC) 05/10/2016   Drug use    History of blood clots    Hyperlipidemia    Hypertension    Influenza A 12/01/2018   Joint pain    Left leg DVT (HCC) 09/29/2013   Lipoma    Abdomen   LIPOMA 01/20/2008   Obesity    Occasional tremors 05/12/2019   Osteoarthritis of right knee    Other fatigue    Painful menstrual periods 01/05/2012   Pneumonia    Prediabetes    Seizure (HCC)    Shortness of breath on exertion    Stroke (HCC)    Tobacco abuse     Assessment:Unable to assess - patient denied need for additional TOC calls Patient Reported Symptoms:  There were no vitals filed for this visit.  Medications Reviewed Today   Medications were not reviewed in this encounter     Goals Addressed             This Visit's Progress    TOC Care Plan - Patient will report no readmissions in the next 30 days       Current Barriers: 02/17/24 Patient denied need for additional calls - states she has seen PCP and oncology and has Home Health PT - closing TOC program  Medication management TOC RN reviewed in detail medication list with patient and daughter  after patient reported she had not looked at her discharge papers yet. Daughter took notes and states she will assist with medications Chronic Disease Management support and education needs related to possible Multiple Myeloma RNCM Clinical Goal(s): 02/17/24 Patient denied need for additional calls - states she has seen PCP and oncology and has Home Health PT - closing TOC program  Patient will work with the Care Management team over the next 30 days to address Transition of Care Barriers: Medication Management verbalize understanding of plan for management of HTN, COPD, and possible new diagnosis of multiple myeloma as evidenced by patient verbalizing understanding take all medications exactly as prescribed and will call provider for medication related questions as evidenced by patient report demonstrate understanding of rationale for each prescribed medication as evidenced by patient report attend all scheduled medical appointments:  PCP appointment 02/10/24 and oncology appointment 02/12/24  Interventions: Evaluation of current treatment plan related to  self management and patient's adherence to plan as established by provider  Transitions of Care: 02/17/24 Unable to assess goal - patient denied need for additional calls and states she is following with Home Health and MDs. Labs education provided regarding need for exam - reinforced need for labs  with phosphorus check Doctor Visits  - discussed the importance of doctor visits Educated on low sodium heart healthy diet Reviewed DME - 3 in 1 and rolling walker reported by patient to be in home Home Health with [facility] - Patient verbalized they are scheduled for 02/09/24 Educated discuss cetirizine refilled with PCP, patient reports not taking iron and will discuss with PCP, patient reported prior to admission Md was going to decrease Wegovy that she usually takes on Thursday and will discuss with PCP Educated obtaining BP and Blood sugar parameters  with MD and checking both daily related changes with medications and to take readings and discharge papers to PCP appointment  COPD Interventions:  (Status:   02/17/24 Unable to assess goal - patient denied need for additional calls and states she is following with Home Health and MDs. Provided instruction about proper use of medications used for management of COPD including inhalers   Oncology:  (Status: 02/17/24 Unable to assess goal - patient denied need for additional calls and states she is following with Home Health and MDs. Assessment of understanding of oncology diagnosis:  Reviewed upcoming provider appointments and treatment appointments  Patient Goals/Self-Care Activities: 02/17/24 Patient denied need for additional calls - states she has seen PCP and oncology and has Home Health PT - closing TOC program  Participate in Transition of Care Program/Attend [facility] scheduled calls Notify RN Care Manager of TOC call rescheduling needs Take all medications as prescribed Attend all scheduled provider appointments Call pharmacy for medication refills 3-7 days in advance of running out of medications Patient will reivew Cetirizine refill, iron, Wegovy with MD Patient will check blood sugar and blood pressure and take results to PCP appointment   Follow Up Plan:  02/17/24 Patient denied need for additional calls - states she has seen PCP and oncology and has Home Health PT - closing TOC program  The patient has been provided with contact information for the care management team and has been advised to call with any health related questions or concerns.          Recommendation:   PCP Follow-up  Follow Up Plan:   Patient denied need for continued TOC calls - closing TOC program   Tonia Frankel RN, CCM [place]  VBCI-Population Health RN Care Manager [phone]

## 2024-02-18 ENCOUNTER — Other Ambulatory Visit

## 2024-02-19 ENCOUNTER — Encounter: Payer: Self-pay | Admitting: Hematology

## 2024-02-19 DIAGNOSIS — J449 Chronic obstructive pulmonary disease, unspecified: Secondary | ICD-10-CM | POA: Diagnosis not present

## 2024-02-19 DIAGNOSIS — E785 Hyperlipidemia, unspecified: Secondary | ICD-10-CM | POA: Diagnosis not present

## 2024-02-19 DIAGNOSIS — S22000D Wedge compression fracture of unspecified thoracic vertebra, subsequent encounter for fracture with routine healing: Secondary | ICD-10-CM | POA: Diagnosis not present

## 2024-02-19 DIAGNOSIS — M25512 Pain in left shoulder: Secondary | ICD-10-CM | POA: Diagnosis not present

## 2024-02-19 DIAGNOSIS — Z7982 Long term (current) use of aspirin: Secondary | ICD-10-CM | POA: Diagnosis not present

## 2024-02-19 DIAGNOSIS — F419 Anxiety disorder, unspecified: Secondary | ICD-10-CM | POA: Diagnosis not present

## 2024-02-19 DIAGNOSIS — Z86711 Personal history of pulmonary embolism: Secondary | ICD-10-CM | POA: Diagnosis not present

## 2024-02-19 DIAGNOSIS — Z6841 Body Mass Index (BMI) 40.0 and over, adult: Secondary | ICD-10-CM | POA: Diagnosis not present

## 2024-02-19 DIAGNOSIS — Z86718 Personal history of other venous thrombosis and embolism: Secondary | ICD-10-CM | POA: Diagnosis not present

## 2024-02-19 DIAGNOSIS — I10 Essential (primary) hypertension: Secondary | ICD-10-CM | POA: Diagnosis not present

## 2024-02-19 DIAGNOSIS — E860 Dehydration: Secondary | ICD-10-CM | POA: Diagnosis not present

## 2024-02-19 DIAGNOSIS — D509 Iron deficiency anemia, unspecified: Secondary | ICD-10-CM | POA: Diagnosis not present

## 2024-02-19 DIAGNOSIS — R7303 Prediabetes: Secondary | ICD-10-CM | POA: Diagnosis not present

## 2024-02-19 DIAGNOSIS — G8929 Other chronic pain: Secondary | ICD-10-CM | POA: Diagnosis not present

## 2024-02-19 DIAGNOSIS — E876 Hypokalemia: Secondary | ICD-10-CM | POA: Diagnosis not present

## 2024-02-19 DIAGNOSIS — Z8673 Personal history of transient ischemic attack (TIA), and cerebral infarction without residual deficits: Secondary | ICD-10-CM | POA: Diagnosis not present

## 2024-02-19 DIAGNOSIS — Z7984 Long term (current) use of oral hypoglycemic drugs: Secondary | ICD-10-CM | POA: Diagnosis not present

## 2024-02-19 DIAGNOSIS — C9000 Multiple myeloma not having achieved remission: Secondary | ICD-10-CM | POA: Diagnosis not present

## 2024-02-19 DIAGNOSIS — M25511 Pain in right shoulder: Secondary | ICD-10-CM | POA: Diagnosis not present

## 2024-02-19 DIAGNOSIS — F319 Bipolar disorder, unspecified: Secondary | ICD-10-CM | POA: Diagnosis not present

## 2024-02-19 DIAGNOSIS — S72142D Displaced intertrochanteric fracture of left femur, subsequent encounter for closed fracture with routine healing: Secondary | ICD-10-CM | POA: Diagnosis not present

## 2024-02-19 DIAGNOSIS — M1711 Unilateral primary osteoarthritis, right knee: Secondary | ICD-10-CM | POA: Diagnosis not present

## 2024-02-23 ENCOUNTER — Ambulatory Visit (INDEPENDENT_AMBULATORY_CARE_PROVIDER_SITE_OTHER): Payer: Medicaid Other | Admitting: Family Medicine

## 2024-02-23 NOTE — Addendum Note (Signed)
 Addended by: Penni Bowman T on: 02/23/2024 09:40 AM   Modules accepted: Orders

## 2024-02-24 ENCOUNTER — Encounter: Payer: Self-pay | Admitting: Family Medicine

## 2024-02-24 ENCOUNTER — Other Ambulatory Visit: Payer: Self-pay

## 2024-02-24 ENCOUNTER — Telehealth: Payer: Self-pay

## 2024-02-24 ENCOUNTER — Other Ambulatory Visit (HOSPITAL_COMMUNITY): Payer: Self-pay

## 2024-02-24 ENCOUNTER — Encounter: Payer: Self-pay | Admitting: Hematology

## 2024-02-24 ENCOUNTER — Ambulatory Visit (INDEPENDENT_AMBULATORY_CARE_PROVIDER_SITE_OTHER): Admitting: Family Medicine

## 2024-02-24 VITALS — BP 159/100 | HR 103 | Ht 67.0 in | Wt 281.0 lb

## 2024-02-24 DIAGNOSIS — R600 Localized edema: Secondary | ICD-10-CM | POA: Diagnosis not present

## 2024-02-24 DIAGNOSIS — E878 Other disorders of electrolyte and fluid balance, not elsewhere classified: Secondary | ICD-10-CM

## 2024-02-24 DIAGNOSIS — I10 Essential (primary) hypertension: Secondary | ICD-10-CM | POA: Diagnosis not present

## 2024-02-24 DIAGNOSIS — R609 Edema, unspecified: Secondary | ICD-10-CM | POA: Insufficient documentation

## 2024-02-24 HISTORY — DX: Edema, unspecified: R60.9

## 2024-02-24 MED ORDER — ACYCLOVIR 400 MG PO TABS: 400.0000 mg | ORAL_TABLET | Freq: Two times a day (BID) | ORAL | 5 refills | Status: DC

## 2024-02-24 MED ORDER — PROCHLORPERAZINE MALEATE 10 MG PO TABS: 10.0000 mg | ORAL_TABLET | Freq: Four times a day (QID) | ORAL | 1 refills | Status: DC | PRN

## 2024-02-24 MED ORDER — ONDANSETRON HCL 8 MG PO TABS: 8.0000 mg | ORAL_TABLET | Freq: Three times a day (TID) | ORAL | 1 refills | Status: DC | PRN

## 2024-02-24 MED ORDER — HYDRALAZINE HCL 25 MG PO TABS
25.0000 mg | ORAL_TABLET | Freq: Three times a day (TID) | ORAL | 0 refills | Status: DC
  Filled 2024-02-24: qty 90, 30d supply, fill #0

## 2024-02-24 MED ORDER — TORSEMIDE 10 MG PO TABS
10.0000 mg | ORAL_TABLET | Freq: Every day | ORAL | 0 refills | Status: DC
  Filled 2024-02-24 (×3): qty 30, 30d supply, fill #0

## 2024-02-24 MED ORDER — DEXAMETHASONE 4 MG PO TABS: ORAL_TABLET | ORAL | 5 refills | Status: DC

## 2024-02-24 NOTE — Progress Notes (Signed)
    SUBJECTIVE:   CHIEF COMPLAINT / HPI:   HTN: She is compliant with Losartan  50 mg QD and Hydralazine  50 mg TID. The 3rd dose of Hydralazine  gives her headaches each time she takes it. Home BP readings have been in the 130 - 140s systolic and 50 - 80 diastolic. No other concerns related to her BP.  Leg edema: B/L LL edema is worsening and is stressful for her. She denies any leg pain or redness. She feels like she is retaining fluid in her body and has gained quite a few pounds. She endorses mild dyspnea on exertion, but attributed this to pollens with her COPD  Electrolyte abnormality: She completed her Phos supplement and is currently taking Magnesium  Oxid 400 mg QD. No new concerns.   NB: The patient came with her daughter and mother today. She wants me to complete an FMLA form for her daughter's job so she can bring her to her doctor's appointments regularly.  Daugter's name: Patrina Boos    PERTINENT  PMH / PSH: PMHx reviewed  OBJECTIVE:   BP (!) 159/100   Pulse (!) 103   Ht 5\' 7"  (1.702 m)   Wt 281 lb (127.5 kg)   LMP  (LMP Unknown)   SpO2 97%   BMI 44.01 kg/m   Physical Exam Vitals and nursing note reviewed.  Constitutional:      General: She is not in acute distress.    Appearance: She is not toxic-appearing.  Cardiovascular:     Rate and Rhythm: Normal rate and regular rhythm.     Heart sounds: Murmur heard.     Comments: 2/6 systolic murmur Pulmonary:     Effort: Pulmonary effort is normal. No respiratory distress.     Breath sounds: Normal breath sounds. No wheezing.  Musculoskeletal:     Right lower leg: Edema present.     Left lower leg: Edema present.     Comments: +++ edema from her ankles to a few inches below her knees. No erythema or tenderness B/L      ASSESSMENT/PLAN:   Assessment & Plan Essential hypertension Poorly controlled on her current regimen She also has intolerance to high dose hydralazine  Reduce doe to 25 TID Add on  Torsemide  which will also help with edema Monitor BP closely at home BP parameters given F/U in 2 weeks for reassessment Edema leg May be multifactorial Recently off hydrochlorothiazide , ?? Related to myeloma vs CHF Previous ECHO reviewed and discussed with her Repeat ECHO recommended give SOB on exertion which she declined Torsemide  10 mg every day initiated Cmet checked - consider adding Kdur to Torsemide  pending test results She agreed with the plan Electrolyte abnormality Cmet, Phos and Mag checked I will contact her soon with results and further recommendations   FMLA for completed.  Penni Bowman, MD [facility]

## 2024-02-24 NOTE — Assessment & Plan Note (Signed)
 May be multifactorial Recently off hydrochlorothiazide , ?? Related to myeloma vs CHF Previous ECHO reviewed and discussed with her Repeat ECHO recommended give SOB on exertion which she declined Torsemide  10 mg every day initiated Cmet checked - consider adding Kdur to Torsemide  pending test results She agreed with the plan

## 2024-02-24 NOTE — Assessment & Plan Note (Addendum)
 Poorly controlled on her current regimen She also has intolerance to high dose hydralazine  Reduce doe to 25 TID Add on Torsemide  which will also help with edema Monitor BP closely at home BP parameters given F/U in 2 weeks for reassessment

## 2024-02-24 NOTE — Addendum Note (Signed)
 Addended by: Jacquelyn Matt on: 02/24/2024 10:14 PM   Modules accepted: Orders

## 2024-02-24 NOTE — Patient Instructions (Signed)
 It was nice seeing you today. I will start you on Torsemide  10 mg daily for your leg swelling. We reduced your Hydralazine  to 25 mg TID. Monitor BP closely at home. Your BP goal is < 140/90 Let us  consider ultrasound of your heart in the future.  I will see you back in 2 weeks.

## 2024-02-24 NOTE — Telephone Encounter (Signed)
 Clinical info completed on Work form from daughter.  Placed form in PCP's box for completion.    When form is completed, please route note to "RN Team" and place in wall pocket in front office.   Linnie Riches, CMA

## 2024-02-24 NOTE — Assessment & Plan Note (Addendum)
 May be multifactorial Recently off hydrochlorothiazide , ?? Related to myeloma vs CHF Previous ECHO reviewed and discussed with her Repeat ECHO recommended give SOB on exertion which she declined Torsemide  10 mg every day initiated Cmet checked - consider adding Kdur to Torsemide  pending test results She agreed with the plan

## 2024-02-24 NOTE — Progress Notes (Signed)
 START ON PATHWAY REGIMEN - Multiple Myeloma and Other Plasma Cell Dyscrasias   DaraVRd (Daratumumab SUBQ + Bortezomib SUBQ D1,4,8,11 + Lenalidomide PO D1-14 + Dexamethasone 20 mg IV/PO D1,2,8,9,15,16) q21 Days (Induction Schema):   A cycle is every 21 days:     Lenalidomide      Dexamethasone      Bortezomib      Daratumumab and hyaluronidase-fihj    DaraVRd (Daratumumab SUBQ + Bortezomib SUBQ D1,4,8,11 + Lenalidomide PO D1-14 + Dexamethasone 20 mg IV/PO D1,2,8,9,15,16) q21 Days (Consolidation Schema):   A cycle is every 21 days:     Lenalidomide      Dexamethasone      Bortezomib      Daratumumab and hyaluronidase-fihj   **Always confirm dose/schedule in your pharmacy ordering system**  Patient Characteristics: Multiple Myeloma, Newly Diagnosed, Transplant Eligible, Standard Risk Disease Classification: Multiple Myeloma Therapeutic Status: Newly Diagnosed R2-ISS Staging: Awaiting Test Results Is Patient Eligible for Transplant<= Transplant Eligible Risk Status: Standard Risk Intent of Therapy: Non-Curative / Palliative Intent, Discussed with Patient

## 2024-02-24 NOTE — Progress Notes (Signed)
 Form partially completed and left in the RN's box  Form requires employee signature - missing on the back of the first page. Please, have employee return to complete form and then return to me for completion. Thanks.

## 2024-02-24 NOTE — Assessment & Plan Note (Addendum)
 Cmet, Phos and Mag checked I will contact her soon with results and further recommendations

## 2024-02-25 ENCOUNTER — Other Ambulatory Visit: Payer: Self-pay

## 2024-02-25 DIAGNOSIS — Z7982 Long term (current) use of aspirin: Secondary | ICD-10-CM | POA: Diagnosis not present

## 2024-02-25 DIAGNOSIS — F419 Anxiety disorder, unspecified: Secondary | ICD-10-CM | POA: Diagnosis not present

## 2024-02-25 DIAGNOSIS — S22000D Wedge compression fracture of unspecified thoracic vertebra, subsequent encounter for fracture with routine healing: Secondary | ICD-10-CM | POA: Diagnosis not present

## 2024-02-25 DIAGNOSIS — R7303 Prediabetes: Secondary | ICD-10-CM | POA: Diagnosis not present

## 2024-02-25 DIAGNOSIS — Z8673 Personal history of transient ischemic attack (TIA), and cerebral infarction without residual deficits: Secondary | ICD-10-CM | POA: Diagnosis not present

## 2024-02-25 DIAGNOSIS — M25512 Pain in left shoulder: Secondary | ICD-10-CM | POA: Diagnosis not present

## 2024-02-25 DIAGNOSIS — D509 Iron deficiency anemia, unspecified: Secondary | ICD-10-CM | POA: Diagnosis not present

## 2024-02-25 DIAGNOSIS — Z7984 Long term (current) use of oral hypoglycemic drugs: Secondary | ICD-10-CM | POA: Diagnosis not present

## 2024-02-25 DIAGNOSIS — F319 Bipolar disorder, unspecified: Secondary | ICD-10-CM | POA: Diagnosis not present

## 2024-02-25 DIAGNOSIS — I10 Essential (primary) hypertension: Secondary | ICD-10-CM | POA: Diagnosis not present

## 2024-02-25 DIAGNOSIS — S72142D Displaced intertrochanteric fracture of left femur, subsequent encounter for closed fracture with routine healing: Secondary | ICD-10-CM | POA: Diagnosis not present

## 2024-02-25 DIAGNOSIS — G8929 Other chronic pain: Secondary | ICD-10-CM | POA: Diagnosis not present

## 2024-02-25 DIAGNOSIS — E876 Hypokalemia: Secondary | ICD-10-CM | POA: Diagnosis not present

## 2024-02-25 DIAGNOSIS — E785 Hyperlipidemia, unspecified: Secondary | ICD-10-CM | POA: Diagnosis not present

## 2024-02-25 DIAGNOSIS — Z6841 Body Mass Index (BMI) 40.0 and over, adult: Secondary | ICD-10-CM | POA: Diagnosis not present

## 2024-02-25 DIAGNOSIS — Z86718 Personal history of other venous thrombosis and embolism: Secondary | ICD-10-CM | POA: Diagnosis not present

## 2024-02-25 DIAGNOSIS — J449 Chronic obstructive pulmonary disease, unspecified: Secondary | ICD-10-CM | POA: Diagnosis not present

## 2024-02-25 DIAGNOSIS — M25511 Pain in right shoulder: Secondary | ICD-10-CM | POA: Diagnosis not present

## 2024-02-25 DIAGNOSIS — Z86711 Personal history of pulmonary embolism: Secondary | ICD-10-CM | POA: Diagnosis not present

## 2024-02-25 DIAGNOSIS — E860 Dehydration: Secondary | ICD-10-CM | POA: Diagnosis not present

## 2024-02-25 DIAGNOSIS — M1711 Unilateral primary osteoarthritis, right knee: Secondary | ICD-10-CM | POA: Diagnosis not present

## 2024-02-25 DIAGNOSIS — C9000 Multiple myeloma not having achieved remission: Secondary | ICD-10-CM | POA: Diagnosis not present

## 2024-02-25 NOTE — Telephone Encounter (Signed)
 Attempted to call daughter regarding missing signature. She did not answer and VM was not set up.   Will place paperwork at front desk for daughter to sign.   Will attempt to reach daughter at later time.   Elsie Halo, RN

## 2024-02-26 ENCOUNTER — Other Ambulatory Visit: Payer: Self-pay

## 2024-02-26 ENCOUNTER — Telehealth: Payer: Self-pay | Admitting: Family Medicine

## 2024-02-26 ENCOUNTER — Encounter: Payer: Self-pay | Admitting: Hematology

## 2024-02-26 LAB — MAGNESIUM: Magnesium: 2 mg/dL (ref 1.6–2.3)

## 2024-02-26 LAB — CMP14+EGFR
ALT: 12 IU/L (ref 0–32)
AST: 18 IU/L (ref 0–40)
Albumin: 3.6 g/dL — ABNORMAL LOW (ref 3.8–4.9)
Alkaline Phosphatase: 97 IU/L (ref 44–121)
BUN/Creatinine Ratio: 26 — ABNORMAL HIGH (ref 9–23)
BUN: 16 mg/dL (ref 6–24)
Bilirubin Total: <0.2 mg/dL (ref 0.0–1.2)
CO2: 19 mmol/L — ABNORMAL LOW (ref 20–29)
Calcium: 9.9 mg/dL (ref 8.7–10.2)
Chloride: 106 mmol/L (ref 96–106)
Creatinine, Ser: 0.61 mg/dL (ref 0.57–1.00)
Globulin, Total: 4.6 g/dL — ABNORMAL HIGH (ref 1.5–4.5)
Glucose: 95 mg/dL (ref 70–99)
Potassium: 4.7 mmol/L (ref 3.5–5.2)
Sodium: 145 mmol/L — ABNORMAL HIGH (ref 134–144)
Total Protein: 8.2 g/dL (ref 6.0–8.5)
eGFR: 104 mL/min/{1.73_m2} (ref >59)

## 2024-02-26 LAB — PHOSPHORUS: Phosphorus: 3.8 mg/dL (ref 3.0–4.3)

## 2024-02-26 NOTE — Telephone Encounter (Addendum)
 Electrolytes are back to normal except Na which is still a bit high, but unchanged. Hydration encouraged.  Given the her K+ is high normal level, we will hold off on adding KDur to her regimen pending her repeat lab at next appointment. May take MgO x 1 more week and then stop. I have advised her that her daughter need to come in a sign the FMLA form submitted for completion. She agreed with the plan.  She is doing well with Hydralazine  dose reduction with no headaches and the Torsemide  is working real fine for her edema.

## 2024-02-26 NOTE — Progress Notes (Signed)
 See telephone encounter.   Elsie Halo, RN

## 2024-02-26 NOTE — Telephone Encounter (Signed)
 Patient's daughter came in and signed forms, placed in Dr. Toya Friar box. She asked if we can please place a copy up front for pick up as well.

## 2024-02-26 NOTE — Telephone Encounter (Signed)
 See telephone note from 02/26/24 with PCP.   Dr. Grandville Lax has made patient aware that daughter's signature is needed.   Elsie Halo, RN

## 2024-02-27 NOTE — Telephone Encounter (Signed)
 Form completed and placed in RN's box  See documentation by Hoyle Maclachlan below. Please make a copy for her daughter to pick up as requested.    Patient's daughter came in and signed forms, placed in Dr. Toya Friar box. She asked if we can please place a copy up front for pick up as well.

## 2024-02-27 NOTE — Telephone Encounter (Signed)
 Patients daughter came in to sign form. Form placed back in Dr. Toya Friar box. Linnie Riches, CMA

## 2024-02-27 NOTE — Telephone Encounter (Signed)
 Called patient and informed that paperwork had been faxed to provided number. Copy made and placed in batch scanning.   Original at front desk for pick up.   Elsie Halo, RN

## 2024-03-01 ENCOUNTER — Other Ambulatory Visit: Payer: Self-pay | Admitting: Family Medicine

## 2024-03-01 ENCOUNTER — Other Ambulatory Visit (HOSPITAL_COMMUNITY): Payer: Self-pay

## 2024-03-01 ENCOUNTER — Ambulatory Visit

## 2024-03-02 ENCOUNTER — Encounter: Payer: Self-pay | Admitting: Family Medicine

## 2024-03-02 DIAGNOSIS — J449 Chronic obstructive pulmonary disease, unspecified: Secondary | ICD-10-CM | POA: Diagnosis not present

## 2024-03-02 DIAGNOSIS — Z86718 Personal history of other venous thrombosis and embolism: Secondary | ICD-10-CM | POA: Diagnosis not present

## 2024-03-02 DIAGNOSIS — Z86711 Personal history of pulmonary embolism: Secondary | ICD-10-CM | POA: Diagnosis not present

## 2024-03-02 DIAGNOSIS — M25511 Pain in right shoulder: Secondary | ICD-10-CM | POA: Diagnosis not present

## 2024-03-02 DIAGNOSIS — D509 Iron deficiency anemia, unspecified: Secondary | ICD-10-CM | POA: Diagnosis not present

## 2024-03-02 DIAGNOSIS — Z8673 Personal history of transient ischemic attack (TIA), and cerebral infarction without residual deficits: Secondary | ICD-10-CM | POA: Diagnosis not present

## 2024-03-02 DIAGNOSIS — E860 Dehydration: Secondary | ICD-10-CM | POA: Diagnosis not present

## 2024-03-02 DIAGNOSIS — M25512 Pain in left shoulder: Secondary | ICD-10-CM | POA: Diagnosis not present

## 2024-03-02 DIAGNOSIS — G8929 Other chronic pain: Secondary | ICD-10-CM | POA: Diagnosis not present

## 2024-03-02 DIAGNOSIS — S72142D Displaced intertrochanteric fracture of left femur, subsequent encounter for closed fracture with routine healing: Secondary | ICD-10-CM | POA: Diagnosis not present

## 2024-03-02 DIAGNOSIS — F319 Bipolar disorder, unspecified: Secondary | ICD-10-CM | POA: Diagnosis not present

## 2024-03-02 DIAGNOSIS — E785 Hyperlipidemia, unspecified: Secondary | ICD-10-CM | POA: Diagnosis not present

## 2024-03-02 DIAGNOSIS — S22000D Wedge compression fracture of unspecified thoracic vertebra, subsequent encounter for fracture with routine healing: Secondary | ICD-10-CM | POA: Diagnosis not present

## 2024-03-02 DIAGNOSIS — C9000 Multiple myeloma not having achieved remission: Secondary | ICD-10-CM | POA: Diagnosis not present

## 2024-03-02 DIAGNOSIS — M1711 Unilateral primary osteoarthritis, right knee: Secondary | ICD-10-CM | POA: Diagnosis not present

## 2024-03-02 DIAGNOSIS — Z6841 Body Mass Index (BMI) 40.0 and over, adult: Secondary | ICD-10-CM | POA: Diagnosis not present

## 2024-03-02 DIAGNOSIS — E876 Hypokalemia: Secondary | ICD-10-CM | POA: Diagnosis not present

## 2024-03-02 DIAGNOSIS — F419 Anxiety disorder, unspecified: Secondary | ICD-10-CM | POA: Diagnosis not present

## 2024-03-02 DIAGNOSIS — Z7982 Long term (current) use of aspirin: Secondary | ICD-10-CM | POA: Diagnosis not present

## 2024-03-02 DIAGNOSIS — R7303 Prediabetes: Secondary | ICD-10-CM | POA: Diagnosis not present

## 2024-03-02 DIAGNOSIS — Z7984 Long term (current) use of oral hypoglycemic drugs: Secondary | ICD-10-CM | POA: Diagnosis not present

## 2024-03-02 DIAGNOSIS — I10 Essential (primary) hypertension: Secondary | ICD-10-CM | POA: Diagnosis not present

## 2024-03-03 ENCOUNTER — Ambulatory Visit: Payer: Self-pay

## 2024-03-03 ENCOUNTER — Encounter: Payer: Self-pay | Admitting: Hematology

## 2024-03-03 ENCOUNTER — Telehealth: Payer: Self-pay | Admitting: Hematology

## 2024-03-03 NOTE — Telephone Encounter (Signed)
 Spoke with patient confirming upcoming appointment

## 2024-03-04 NOTE — Telephone Encounter (Signed)
 Called patient.   Advised I spoke with PCP in regards to BP and lab check. Advised before any medication changes are made, she would need a clinic apt.  Advised if she does not want to wait for scheduled PCP apt on 5/13, I could schedule her with a different provider. She reports she prefers to see PCP only. Patient will wait for 5/13 apt.   Discussed ED precautions in the meantime.

## 2024-03-08 ENCOUNTER — Encounter: Payer: Self-pay | Admitting: Family Medicine

## 2024-03-09 ENCOUNTER — Inpatient Hospital Stay: Attending: Hematology

## 2024-03-09 DIAGNOSIS — D649 Anemia, unspecified: Secondary | ICD-10-CM | POA: Insufficient documentation

## 2024-03-09 DIAGNOSIS — E876 Hypokalemia: Secondary | ICD-10-CM | POA: Insufficient documentation

## 2024-03-09 DIAGNOSIS — E860 Dehydration: Secondary | ICD-10-CM | POA: Insufficient documentation

## 2024-03-09 DIAGNOSIS — Z5111 Encounter for antineoplastic chemotherapy: Secondary | ICD-10-CM | POA: Insufficient documentation

## 2024-03-09 DIAGNOSIS — Z87891 Personal history of nicotine dependence: Secondary | ICD-10-CM | POA: Insufficient documentation

## 2024-03-09 DIAGNOSIS — C9000 Multiple myeloma not having achieved remission: Secondary | ICD-10-CM | POA: Insufficient documentation

## 2024-03-09 DIAGNOSIS — Z5112 Encounter for antineoplastic immunotherapy: Secondary | ICD-10-CM | POA: Insufficient documentation

## 2024-03-10 ENCOUNTER — Other Ambulatory Visit: Payer: Self-pay | Admitting: Family Medicine

## 2024-03-10 ENCOUNTER — Telehealth: Payer: Self-pay

## 2024-03-10 DIAGNOSIS — Z8673 Personal history of transient ischemic attack (TIA), and cerebral infarction without residual deficits: Secondary | ICD-10-CM | POA: Diagnosis not present

## 2024-03-10 DIAGNOSIS — E876 Hypokalemia: Secondary | ICD-10-CM | POA: Diagnosis not present

## 2024-03-10 DIAGNOSIS — M25512 Pain in left shoulder: Secondary | ICD-10-CM | POA: Diagnosis not present

## 2024-03-10 DIAGNOSIS — D509 Iron deficiency anemia, unspecified: Secondary | ICD-10-CM | POA: Diagnosis not present

## 2024-03-10 DIAGNOSIS — C9000 Multiple myeloma not having achieved remission: Secondary | ICD-10-CM | POA: Diagnosis not present

## 2024-03-10 DIAGNOSIS — S22000D Wedge compression fracture of unspecified thoracic vertebra, subsequent encounter for fracture with routine healing: Secondary | ICD-10-CM | POA: Diagnosis not present

## 2024-03-10 DIAGNOSIS — Z86711 Personal history of pulmonary embolism: Secondary | ICD-10-CM | POA: Diagnosis not present

## 2024-03-10 DIAGNOSIS — F319 Bipolar disorder, unspecified: Secondary | ICD-10-CM | POA: Diagnosis not present

## 2024-03-10 DIAGNOSIS — Z6841 Body Mass Index (BMI) 40.0 and over, adult: Secondary | ICD-10-CM | POA: Diagnosis not present

## 2024-03-10 DIAGNOSIS — Z7982 Long term (current) use of aspirin: Secondary | ICD-10-CM | POA: Diagnosis not present

## 2024-03-10 DIAGNOSIS — E785 Hyperlipidemia, unspecified: Secondary | ICD-10-CM | POA: Diagnosis not present

## 2024-03-10 DIAGNOSIS — Z86718 Personal history of other venous thrombosis and embolism: Secondary | ICD-10-CM | POA: Diagnosis not present

## 2024-03-10 DIAGNOSIS — E860 Dehydration: Secondary | ICD-10-CM | POA: Diagnosis not present

## 2024-03-10 DIAGNOSIS — F419 Anxiety disorder, unspecified: Secondary | ICD-10-CM | POA: Diagnosis not present

## 2024-03-10 DIAGNOSIS — Z7984 Long term (current) use of oral hypoglycemic drugs: Secondary | ICD-10-CM | POA: Diagnosis not present

## 2024-03-10 DIAGNOSIS — M25511 Pain in right shoulder: Secondary | ICD-10-CM | POA: Diagnosis not present

## 2024-03-10 DIAGNOSIS — G8929 Other chronic pain: Secondary | ICD-10-CM | POA: Diagnosis not present

## 2024-03-10 DIAGNOSIS — R7303 Prediabetes: Secondary | ICD-10-CM | POA: Diagnosis not present

## 2024-03-10 DIAGNOSIS — M1711 Unilateral primary osteoarthritis, right knee: Secondary | ICD-10-CM | POA: Diagnosis not present

## 2024-03-10 DIAGNOSIS — I10 Essential (primary) hypertension: Secondary | ICD-10-CM | POA: Diagnosis not present

## 2024-03-10 DIAGNOSIS — J449 Chronic obstructive pulmonary disease, unspecified: Secondary | ICD-10-CM | POA: Diagnosis not present

## 2024-03-10 DIAGNOSIS — S72142D Displaced intertrochanteric fracture of left femur, subsequent encounter for closed fracture with routine healing: Secondary | ICD-10-CM | POA: Diagnosis not present

## 2024-03-10 NOTE — Telephone Encounter (Signed)
 Jae Dire from [facility] calling for PT verbal orders as follows:  1 time(s) weekly for 4 week(s) Verbal orders given per [facility] protocol  Veronda Prude, RN

## 2024-03-11 ENCOUNTER — Other Ambulatory Visit (HOSPITAL_COMMUNITY): Payer: Self-pay

## 2024-03-11 ENCOUNTER — Other Ambulatory Visit: Payer: Self-pay | Admitting: Family Medicine

## 2024-03-11 MED ORDER — TIZANIDINE HCL 4 MG PO TABS
4.0000 mg | ORAL_TABLET | Freq: Two times a day (BID) | ORAL | 1 refills | Status: DC | PRN
  Filled 2024-03-11: qty 60, 30d supply, fill #0
  Filled 2024-04-30: qty 60, 30d supply, fill #1

## 2024-03-11 NOTE — Progress Notes (Signed)
 HEMATOLOGY/ONCOLOGY CLINIC NOTE  Date of Service: 03/12/2024  Patient Care Team: Arn Lane, MD as PCP - General (Family Medicine) Lisabeth Rider, MD as Consulting Physician (Neurology) Dorothe Gaster, RD as Dietitian (Family Medicine) Frankie Israel, MD as Consulting Physician (Hematology)  CHIEF COMPLAINTS/PURPOSE OF CONSULTATION:  Multiple myeloma  HISTORY OF PRESENTING ILLNESS:  Carla Hanson is a wonderful 58 y.o. 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   Patient has a history of hypertension, dyslipidemia, COPD, history of PE/DVT, prediabetes, obesity who notes that she was working out in the gym and had significant back pain resulting in an x-ray that showed T6 compression fracture in January 2025.   She had a follow-up with [facility] wellness clinic on 3/27 and reported symptoms of significant weakness dizziness lightheadedness and dehydration despite drinking a lot of water.  She also noted nausea but no emesis and constipation. Patient was seen in the emergency room and noted to have severe hypercalcemia with a calcium  level of 17.8 with an albumin of 3.8, total protein was elevated at 9.1 with an increased globulin gap, potassium was 2.7. 25-hydroxy vitamin D  levels were within normal limits at 66.9. TSH was normal at 0.736. PTH levels were appropriately suppressed at 6 which is suggestive of non hyperparathyroid hypercalcemia. CBC-on admission showed normal hemoglobin of 13.1 in the context of significant hemoconcentration.  This might be hiding her baseline anemia which might declare itself once the patient's volume status is replaced.  No other cytopenias. Patient notes no acute new focal bone pains.   Patient notes no fevers no chills no night sweats. No previous history of cancers. Her last mammogram was on 02/07/2023 and was within normal limits.  She is  previously noted to have a benign lipoma in the right breast which was resected. Patient has been ex-smoker and smoked half to 1 pack a day for 35 years and quit in December 2017.   New focal symptoms. Patient has received 1 dose of Zometa  and IV fluids and calcium  levels today are down to 12 with a corrected calcium  between 13 and 14. She is receiving IV fluids potassium replacement phosphorus  replacement and calcitonin today.   Normal labs have been ordered and patient is pending whole-body skeletal survey.  24-hour urine collection for myeloma has been sent out.   INTERVAL HISTORY:  Carla Hanson is a 58 y.o. female here for hospital follow-up for evaluation and management of multiple myeloma.   She was last seen by me on 02/12/2024 and reported seasonal allergy issues, wheezing sometimes, occasional mild back stiffness, headaches attributed to blood pressure medication, and bilateral leg swelling.   Today, she is accompanied by her mother. Patient reports that she has been doing well overall since her last clinical visit. She has been eating well and sleeping fairly well.  Patient does receive home physical therapy.  She reports stable leg swelling and denies any pain in the lower extremities. Patient denies any muscle cramping.   She reports upper abdominal spasms, which typically occur at night. Patient reports that her abdominal pain could be due to stress and could possibly be related to certain food intake. She denies heart burn.   She does have Zofran  and companzine available at home if needed. Patient continues to be on acyclovir , Dexamethasone , and Vitamin D . Patient continues to be on Torsemide  10 MG managed by her PCP.  She reports that she is prediabetic. Patient checks her blood glucose levels occasionally at home and notes that they typically range 99-110. She continues to be on Metformin . Patient has not yet started Wegovy .   Patient generally stays well-hydrated.    She reports having some mild aching bone pain localized over her right shoulder blade. The are is painful on palpation. She has no other uncontrolled pain that is limiting her mobility.   She reports that her mother will continue to help care for her until her daughter, who currently resides in [place], is able to take over.   MEDICAL HISTORY:  Past Medical History:  Diagnosis Date   Alcohol abuse    Anxiety    Back pain    Bipolar disorder (HCC)    Bronchitis    COPD exacerbation (HCC) 05/10/2016   Drug use    Edema 02/24/2024   History of blood clots    Hyperlipidemia    Hypertension    Influenza A 12/01/2018   Joint pain    Left leg DVT (HCC) 09/29/2013   Lipoma    Abdomen   LIPOMA 01/20/2008   Obesity    Occasional tremors 05/12/2019   Osteoarthritis of right knee    Other fatigue    Painful menstrual periods 01/05/2012   Pneumonia    Prediabetes    Seizure (HCC)    Shortness of breath on exertion    Stroke (HCC)    Tobacco abuse     SURGICAL HISTORY: Past Surgical History:  Procedure Laterality Date   CESAREAN SECTION     CYSTECTOMY     LIPOMA EXCISION  03/2011   ORIF ANKLE FRACTURE  03/13/2012   Procedure: OPEN REDUCTION INTERNAL FIXATION (ORIF) ANKLE FRACTURE;  Surgeon: Alphonso Jean, MD;  Location: [facility];  Service: Orthopedics;  Laterality: Right;   TUBAL LIGATION      SOCIAL HISTORY: Social History   Socioeconomic History   Marital status: Divorced    Spouse name: Not on file   Number of children: 4   Years of education: 10   Highest education level: Not on file  Occupational History   Occupation: stay at home  Tobacco Use   Smoking status: Former    Current packs/day: 0.00    Average packs/day: 0.5 packs/day for 35.0 years (17.5 ttl pk-yrs)    Types: Cigarettes    Start date: 11/04/1981    Quit date: 10/2016    Years since quitting: 7.4   Smokeless tobacco: Never  Vaping Use   Vaping status: Never Used  Substance and Sexual  Activity   Alcohol use: No    Alcohol/week: 0.0 standard drinks of alcohol    Comment: Quit 10/2016   Drug use: Yes    Types: Marijuana   Sexual activity: Not Currently    Comment: tubal  Other Topics Concern   Not on file  Social History Narrative   Unemployed, 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  Considering nursing.    Lives at home alone   Right-handed   Caffeine: not much   Social Drivers of Corporate investment banker Strain: Low Risk  (11/25/2023)   Overall Financial Resource Strain (CARDIA)    Difficulty of Paying Living Expenses: Not hard at all  Food Insecurity: No Food Insecurity (02/06/2024)   Hunger Vital Sign    Worried About Running Out of Food in the Last Year: Never true  Ran Out of Food in the Last Year: Never true  Transportation Needs: No Transportation Needs (02/06/2024)   PRAPARE - Administrator, Civil Service (Medical): No    Lack of Transportation (Non-Medical): No  Physical Activity: Sufficiently Active (11/25/2023)   Exercise Vital Sign    Days of Exercise per Week: 5 days    Minutes of Exercise per Session: 60 min  Stress: No Stress Concern Present (11/25/2023)   [employer] of Occupational Health - Occupational Stress Questionnaire    Feeling of Stress : Not at all  Social Connections: Moderately Integrated (01/31/2024)   Social Connection and Isolation Panel [NHANES]    Frequency of Communication with Friends and Family: Three times a week    Frequency of Social Gatherings with Friends and Family: Once a week    Attends Religious Services: More than 4 times per year    Active Member of [employer] or Organizations: Yes    Attends Banker Meetings: Patient declined    Marital Status: Divorced  Catering manager Violence: Not At Risk (02/06/2024)   Humiliation, Afraid, Rape, and Kick questionnaire    Fear of Current or Ex-Partner: No     Emotionally Abused: No    Physically Abused: No    Sexually Abused: No    FAMILY HISTORY: Family History  Problem Relation Age of Onset   Other Mother        High blood pressure runs in the family   Obesity Mother    Asthma Father    Heart attack Father    Obesity Father    Diabetes Maternal Aunt    Breast cancer Neg Hx     ALLERGIES:  is allergic to latuda  [lurasidone ].  MEDICATIONS:  Current Outpatient Medications  Medication Sig Dispense Refill   acetaminophen  (TYLENOL ) 325 MG tablet Take 650 mg by mouth every 6 (six) hours as needed for mild pain (pain score 1-3). (Patient not taking: Reported on 02/10/2024)     acyclovir  (ZOVIRAX ) 400 MG tablet Take 1 tablet (400 mg total) by mouth 2 (two) times daily. 60 tablet 5   albuterol  (PROVENTIL ) (2.5 MG/3ML) 0.083% nebulizer solution Take 3 mLs (2.5 mg total) by nebulization every 6 (six) hours as needed for wheezing or shortness of breath. (Patient not taking: Reported on 02/24/2024) 75 mL 2   albuterol  (VENTOLIN  HFA) 108 (90 Base) MCG/ACT inhaler Inhale 2 puffs into the lungs every 6 (six) hours as needed for wheezing or shortness of breath. (Patient not taking: Reported on 02/10/2024) 18 g 4   aspirin  EC 81 MG EC tablet Take 1 tablet (81 mg total) by mouth daily. 30 tablet 12   cetirizine  (ZYRTEC ) 10 MG tablet Take 1 tablet (10 mg total) by mouth daily. 90 tablet 1   dexamethasone  (DECADRON ) 4 MG tablet Take 5 tabs (20 mg) weekly the day after daratumumab for 12 weeks. Take with breakfast. 20 tablet 5   Elastic Bandages & Supports (MUELLER ADJUSTABLE BACK BRACE) MISC Use daily to support thoracic spine 1 each 0   ferrous sulfate  325 (65 FE) MG tablet Take 325 mg by mouth daily with breakfast.     hydrALAZINE  (APRESOLINE ) 25 MG tablet Take 1 tablet (25 mg total) by mouth 3 (three) times daily. 90 tablet 0   losartan  (COZAAR ) 50 MG tablet Take 1 tablet (50 mg total) by mouth daily. 90 tablet 0   magnesium  oxide (MAG-OX) 400 (240 Mg) MG  tablet Take 400 mg by mouth daily.  metFORMIN  (GLUCOPHAGE ) 500 MG tablet Take 1 tablet (500 mg total) by mouth daily with breakfast.     ondansetron  (ZOFRAN ) 8 MG tablet Take 1 tablet (8 mg total) by mouth every 8 (eight) hours as needed for nausea or vomiting. 30 tablet 1   prochlorperazine  (COMPAZINE ) 10 MG tablet Take 1 tablet (10 mg total) by mouth every 6 (six) hours as needed for nausea or vomiting. 30 tablet 1   rosuvastatin  (CRESTOR ) 20 MG tablet TAKE 1 TABLET(20 MG) BY MOUTH DAILY 90 tablet 1   Semaglutide -Weight Management (WEGOVY ) 0.5 MG/0.5ML SOAJ Inject 0.5 mg into the skin once a week. (Patient not taking: Reported on 02/10/2024) 2 mL 0   tiZANidine  (ZANAFLEX ) 4 MG tablet Take 1 tablet (4 mg total) by mouth 2 (two) times daily as needed for muscle spasms. 60 tablet 1   torsemide  (DEMADEX ) 10 MG tablet Take 1 tablet (10 mg total) by mouth daily. 30 tablet 0   No current facility-administered medications for this visit.    REVIEW OF SYSTEMS:    10 Point review of Systems was done is negative except as noted above.   PHYSICAL EXAMINATION: ECOG PERFORMANCE STATUS: 2  . Vitals:   03/12/24 0953  BP: (!) 169/79  Pulse: 90  Resp: 16  Temp: (!) 97.5 F (36.4 C)  SpO2: 99%    Filed Weights   03/12/24 0953  Weight: 279 lb 3.2 oz (126.6 kg)    .Body mass index is 43.73 kg/m.   GENERAL:alert, in no acute distress and comfortable SKIN: no acute rashes, no significant lesions EYES: conjunctiva are pink and non-injected, sclera anicteric OROPHARYNX: MMM, no exudates, no oropharyngeal erythema or ulceration NECK: supple, no JVD LYMPH:  no palpable lymphadenopathy in the cervical, axillary or inguinal regions LUNGS: clear to auscultation b/l with normal respiratory effort HEART: regular rate & rhythm ABDOMEN:  normoactive bowel sounds , non tender, not distended. Extremity: no pedal edema PSYCH: alert & oriented x 3 with fluent speech NEURO: no focal motor/sensory  deficits   LABORATORY DATA:  I have reviewed the data as listed  .    Latest Ref Rng & Units 02/10/2024   12:01 PM 02/05/2024    5:01 AM 02/04/2024    5:05 AM  CBC  WBC 3.4 - 10.8 x10E3/uL 7.7  9.0  8.6   Hemoglobin 11.1 - 15.9 g/dL 16.1  09.6  04.5   Hematocrit 34.0 - 46.6 % 34.7  34.1  34.6   Platelets 150 - 450 x10E3/uL 342  302  306     .    Latest Ref Rng & Units 02/24/2024   12:17 PM 02/10/2024   12:01 PM 02/04/2024    5:05 AM  CMP  Glucose 70 - 99 mg/dL 95  76  409   BUN 6 - 24 mg/dL 16  11  28    Creatinine 0.57 - 1.00 mg/dL 8.11  9.14  7.82   Sodium 134 - 144 mmol/L 145  145  136   Potassium 3.5 - 5.2 mmol/L 4.7  3.6  3.7   Chloride 96 - 106 mmol/L 106  105  108   CO2 20 - 29 mmol/L 19  19  23    Calcium  8.7 - 10.2 mg/dL 9.9  8.2  8.4   Total Protein 6.0 - 8.5 g/dL 8.2  7.6  7.5   Total Bilirubin 0.0 - 1.2 mg/dL <9.5  0.2  0.4   Alkaline Phos 44 - 121 IU/L 97  90  41  AST 0 - 40 IU/L 18  19  32   ALT 0 - 32 IU/L 12  43  38    02/05/2024 Bone Marrow Biopsy:       RADIOGRAPHIC STUDIES: I have personally reviewed the radiological images as listed and agreed with the findings in the report. NM PET Image Initial (PI) Whole Body Result Date: 02/14/2024 CLINICAL DATA:  Initial treatment strategy for multiple myeloma. EXAM: NUCLEAR MEDICINE PET WHOLE BODY TECHNIQUE: 13.72 mCi F-18 FDG was injected intravenously. Full-ring PET imaging was performed from the head to foot after the radiotracer. CT data was obtained and used for attenuation correction and anatomic localization. Fasting blood glucose: 99 mg/dl COMPARISON:  Bone survey 02/02/2024 FINDINGS: Mediastinal blood pool activity: SUV max 2.7 HEAD/NECK: Patient is kyphotic limit evaluation for the anterior aspect of the calvarium and intracranial compartment. Grossly no specific abnormal radiotracer uptake along the visualized head neck regions including along lymph node change of the submandibular, posterior triangle or  internal jugular region. The visualized portions of the intracranial compartment has near symmetric uptake except for area of decreased uptake along the left occipital/temporal region. Please correlate for any known history of previous injury or other process. Incidental CT findings: Visualized portions of the paranasal sinuses and mastoid air cells are clear. The thyroid  gland, submandibular gland and parotid glands are grossly preserved. Area of encephalomalacia in the left occipital/temporal region corresponding the area of photopenia on PET images. CHEST: No specific abnormal radiotracer uptake seen above mediastinal blood pool in the axillary regions, hilum or mediastinum. No abnormal lung uptake. Incidental CT findings: Heart is slightly enlarged. Normal caliber thoracic aorta. Vascular calcifications. Significant breathing motion. There are some small nodes in some vessels in the posterior mediastinum. No abnormal uptake in these locations. Lungs are without consolidation, pneumothorax or effusion. There is a bandlike and slightly nodular area in the right lower lobe on image 95 the CT scan measuring 2.4 cm maximal dimension. This has minimal uptake with maximum SUV of 3.8. Favor a etiology such as atypical atelectasis or other process but would recommend follow-up. There is some bandlike changes at the left lung base. ABDOMEN/PELVIS: Physiologic distribution radiotracer identified along the parenchymal organs, bowel and renal collecting systems. Few areas of slightly more prominent uptake along bowel in the lateral posterior right hemipelvis, slightly atypical distribution of tracer along the bowel. Incidental CT findings: Spleen is nonenlarged. No abnormal calcifications seen within either kidney nor along the course of either ureter. Scattered vascular calcifications are seen. Large bowel is normal course and caliber with scattered stool. Left-sided colonic diverticula. Normal appendix.  SKELETON/extremities: There are extensive lytic lesions seen throughout the skeleton. These include several areas throughout the cervical, thoracic and lumbar spine as well as the sacrum. Subtle areas seen throughout the pelvis, ribs, scapulae, sternum and clavicles. Is also a few areas along the proximal humeri, femoral heads and shafts of the femurs. Even suggestion along the upper tibia bilaterally. The forearms and distal femur not included in the imaging field. There are a few areas which show some uptake. These include foci along ribs. Most intense appears to be involving the anterolateral aspect of the left ribcage particularly the third and fourth ribs with maximum SUV of 6.4 corresponding to the fourth rib. Additional uptake along the third rib on the left. Several other ribs show some more mild uptake. Mild areas uptake along the iliac bones with area on left side on PET image 148 maximum SUV of 4.6. Few areas  of mild uptake along the spine. Majority of lesions do not show significant abnormal radiotracer uptake. No abnormal uptake along the soft tissues. Mild soft tissue edema identified particularly along the lower extremities and particularly in the calf regions. Fixation hardware noted along the lateral aspect of the distal right fibula. Associated streak artifact IMPRESSION: Extensive distribution of lytic lesions throughout the skeleton. A few show some mild areas of abnormal radiotracer uptake particularly along the ribcage, iliac bones No soft tissue areas of abnormal uptake including along lymph nodes. Nodular opacity along the right lung base with minimal uptake. This could be atypical atelectasis but please correlate with any prior chest imaging or short follow-up imaging and 3 months with CT. Electronically Signed   By: Adrianna Horde M.D.   On: 02/14/2024 11:44    ASSESSMENT & PLAN:   58 year old female with severe non-PTH hypercalcemia with an admission calcium  level of 17.8    1) Severe  non-PTH hypercalcemia of malignancy related to Multiple myeloma Calcium  level 17.8 on admission much improved with corrected calcium  of around 11 PTH appropriately suppressed with a PTH level of 6. Patient has a globulin gap and Anemia and bone survey shows multiple bone lesions, Overall presentation is certainly concerning for multiple myeloma. Other paraneoplastic hypercalcemia is also possibility. 25-hydroxy vitamin D  levels are within normal limits at 66. Status post 1 dose of Zometa    #2 dehydration and volume contraction due to hypercalcemia.   #3  Hypertension #4 dyslipidemia #5 COPD #6 prediabetes #7 multiple electrolyte abnormalities including hypokalemia, hypophosphatemia and severe hypercalcemia. #8 multiple osseous lesions concerning for multiple myeloma.  Also noted to have left femoral intertrochanteric lesion. #9 history of T6 compression fracture was likely related to multiple myeloma.  Mild pain at this time.  PLAN:  -Discussed lab results on 03/12/2024 in detail with patient. CBC showed WBC of 8.6K, hemoglobin of 9.8, and platelets of 315K. -mild anemia parly from myeloma should improve with treatment -CMP shows calcium  in low 11s, which should improve with treatment -myeloma labs today pending -02/12/2024 PET scan shows: "Extensive distribution of lytic lesions throughout the skeleton. A few show some mild areas of abnormal radiotracer uptake particularly along the ribcage, iliac bones. No soft tissue areas of abnormal uptake including along lymph nodes. Nodular opacity along the right lung base with minimal uptake. This could be atypical atelectasis but please correlate with any prior chest imaging or short follow-up imaging and 3 months with CT." -proceed with daratumumab/Dexamethasone   -patient notes some tingling and numbness in her feet though no previous h/o neuropathy -- will introduce Velcade +/- Revlimid from C2  -patient is due for her next dose of  Zometa  -continue acyclovir  to prevent shingles -discussed importance of monitoring for reactions especially within the first 24-48 hours of treatment -advised patient to check her blood glucose levels at home once she is started on steroids. If her blood glucose levels are much higher, there may be a need to back off of steroids sooner -discussed that it may be possible that she may have certain food intolerances, such as to dairy, gluten, or nuts. Also possible that her upper abdominal pain could be related to high calcium , acid reflux, or stress.  -advised patient to monitor for any possible food triggers causing upper abdominal pain and if it improves with myeloma treatment -answered all of patient's and her mother's questions in detail  FOLLOW-UP: Per integrated scheduling  The total time spent in the appointment was 32 minutes* .  All  of the patient's questions were answered with apparent satisfaction. The patient knows to call the clinic with any problems, questions or concerns.   Jacquelyn Matt MD MS AAHIVMS [facility] Hematology/Oncology Physician [facility]  .*Total Encounter Time as defined by the Centers for Medicare and Medicaid Services includes, in addition to the face-to-face time of a patient visit (documented in the note above) non-face-to-face time: obtaining and reviewing outside history, ordering and reviewing medications, tests or procedures, care coordination (communications with other health care professionals or caregivers) and documentation in the medical record.    I,Mitra Faeizi,acting as a Neurosurgeon for Jacquelyn Matt, MD.,have documented all relevant documentation on the behalf of Jacquelyn Matt, MD,as directed by  Jacquelyn Matt, MD while in the presence of Jacquelyn Matt, MD.  .I have reviewed the above documentation for accuracy and completeness, and I agree with the above. .Shirrell Solinger Kishore Mekaela Azizi MD

## 2024-03-11 NOTE — Progress Notes (Signed)
 Pharmacist Chemotherapy Monitoring - Initial Assessment    Anticipated start date: 03/18/24   The following has been reviewed per standard work regarding the patient's treatment regimen: The patient's diagnosis, treatment plan and drug doses, and organ/hematologic function Lab orders and baseline tests specific to treatment regimen  The treatment plan start date, drug sequencing, and pre-medications Prior authorization status  Patient's documented medication list, including drug-drug interaction screen and prescriptions for anti-emetics and supportive care specific to the treatment regimen The drug concentrations, fluid compatibility, administration routes, and timing of the medications to be used The patient's access for treatment and lifetime cumulative dose history, if applicable  The patient's medication allergies and previous infusion related reactions, if applicable   Changes made to treatment plan:  N/A  Follow up needed:  N/A   Cherylynn Cosier, RPH, 03/11/2024  8:24 AM

## 2024-03-12 ENCOUNTER — Inpatient Hospital Stay

## 2024-03-12 ENCOUNTER — Inpatient Hospital Stay (HOSPITAL_BASED_OUTPATIENT_CLINIC_OR_DEPARTMENT_OTHER): Admitting: Hematology

## 2024-03-12 VITALS — BP 169/79 | HR 90 | Temp 97.5°F | Resp 16 | Wt 279.2 lb

## 2024-03-12 VITALS — BP 160/85 | HR 92 | Temp 98.1°F | Resp 18

## 2024-03-12 DIAGNOSIS — Z87891 Personal history of nicotine dependence: Secondary | ICD-10-CM | POA: Diagnosis not present

## 2024-03-12 DIAGNOSIS — C9000 Multiple myeloma not having achieved remission: Secondary | ICD-10-CM

## 2024-03-12 DIAGNOSIS — Z5111 Encounter for antineoplastic chemotherapy: Secondary | ICD-10-CM | POA: Diagnosis not present

## 2024-03-12 DIAGNOSIS — D649 Anemia, unspecified: Secondary | ICD-10-CM | POA: Diagnosis not present

## 2024-03-12 DIAGNOSIS — Z5112 Encounter for antineoplastic immunotherapy: Secondary | ICD-10-CM | POA: Diagnosis not present

## 2024-03-12 DIAGNOSIS — E876 Hypokalemia: Secondary | ICD-10-CM | POA: Diagnosis not present

## 2024-03-12 DIAGNOSIS — E860 Dehydration: Secondary | ICD-10-CM | POA: Diagnosis not present

## 2024-03-12 LAB — CMP (CANCER CENTER ONLY)
ALT: 15 U/L (ref 0–44)
AST: 18 U/L (ref 15–41)
Albumin: 3.9 g/dL (ref 3.5–5.0)
Alkaline Phosphatase: 48 U/L (ref 38–126)
Anion gap: 4 — ABNORMAL LOW (ref 5–15)
BUN: 16 mg/dL (ref 6–20)
CO2: 32 mmol/L (ref 22–32)
Calcium: 11.2 mg/dL — ABNORMAL HIGH (ref 8.9–10.3)
Chloride: 104 mmol/L (ref 98–111)
Creatinine: 0.67 mg/dL (ref 0.44–1.00)
GFR, Estimated: >60 mL/min (ref >60)
Glucose, Bld: 105 mg/dL — ABNORMAL HIGH (ref 70–99)
Potassium: 3.4 mmol/L — ABNORMAL LOW (ref 3.5–5.1)
Sodium: 140 mmol/L (ref 135–145)
Total Bilirubin: 0.3 mg/dL (ref 0.0–1.2)
Total Protein: 10.1 g/dL — ABNORMAL HIGH (ref 6.5–8.1)

## 2024-03-12 LAB — CBC WITH DIFFERENTIAL (CANCER CENTER ONLY)
Abs Immature Granulocytes: 0.1 10*3/uL — ABNORMAL HIGH (ref 0.00–0.07)
Basophils Absolute: 0.1 10*3/uL (ref 0.0–0.1)
Basophils Relative: 1 %
Eosinophils Absolute: 0.2 10*3/uL (ref 0.0–0.5)
Eosinophils Relative: 2 %
HCT: 30.1 % — ABNORMAL LOW (ref 36.0–46.0)
Hemoglobin: 9.8 g/dL — ABNORMAL LOW (ref 12.0–15.0)
Immature Granulocytes: 1 %
Lymphocytes Relative: 30 %
Lymphs Abs: 2.6 10*3/uL (ref 0.7–4.0)
MCH: 26.6 pg (ref 26.0–34.0)
MCHC: 32.6 g/dL (ref 30.0–36.0)
MCV: 81.8 fL (ref 80.0–100.0)
Monocytes Absolute: 0.8 10*3/uL (ref 0.1–1.0)
Monocytes Relative: 9 %
Neutro Abs: 4.9 10*3/uL (ref 1.7–7.7)
Neutrophils Relative %: 57 %
Platelet Count: 315 10*3/uL (ref 150–400)
RBC: 3.68 MIL/uL — ABNORMAL LOW (ref 3.87–5.11)
RDW: 16.2 % — ABNORMAL HIGH (ref 11.5–15.5)
WBC Count: 8.6 10*3/uL (ref 4.0–10.5)
nRBC: 0 % (ref 0.0–0.2)

## 2024-03-12 LAB — TYPE AND SCREEN
ABO/RH(D): A POS
Antibody Screen: NEGATIVE

## 2024-03-12 LAB — LACTATE DEHYDROGENASE: LDH: 302 U/L — ABNORMAL HIGH (ref 98–192)

## 2024-03-12 LAB — PRETREATMENT RBC PHENOTYPE

## 2024-03-12 MED ORDER — ACETAMINOPHEN 325 MG PO TABS
650.0000 mg | ORAL_TABLET | Freq: Once | ORAL | Status: AC
  Administered 2024-03-12: 650 mg via ORAL
  Filled 2024-03-12: qty 2

## 2024-03-12 MED ORDER — FAMOTIDINE 20 MG PO TABS
20.0000 mg | ORAL_TABLET | Freq: Once | ORAL | Status: AC
  Administered 2024-03-12: 20 mg via ORAL
  Filled 2024-03-12: qty 1

## 2024-03-12 MED ORDER — BORTEZOMIB CHEMO SQ INJECTION 3.5 MG (2.5MG/ML)
1.5000 mg/m2 | Freq: Once | INTRAMUSCULAR | Status: AC
  Administered 2024-03-12: 3.75 mg via SUBCUTANEOUS
  Filled 2024-03-12: qty 1.5

## 2024-03-12 MED ORDER — MONTELUKAST SODIUM 10 MG PO TABS
10.0000 mg | ORAL_TABLET | Freq: Once | ORAL | Status: AC
  Administered 2024-03-12: 10 mg via ORAL
  Filled 2024-03-12: qty 1

## 2024-03-12 MED ORDER — ZOLEDRONIC ACID 4 MG/100ML IV SOLN
4.0000 mg | Freq: Once | INTRAVENOUS | Status: AC
  Administered 2024-03-12: 4 mg via INTRAVENOUS
  Filled 2024-03-12: qty 100

## 2024-03-12 MED ORDER — DARATUMUMAB-HYALURONIDASE-FIHJ 1800-30000 MG-UT/15ML ~~LOC~~ SOLN
1800.0000 mg | Freq: Once | SUBCUTANEOUS | Status: AC
  Administered 2024-03-12: 1800 mg via SUBCUTANEOUS
  Filled 2024-03-12: qty 15

## 2024-03-12 MED ORDER — DEXAMETHASONE 4 MG PO TABS
20.0000 mg | ORAL_TABLET | Freq: Once | ORAL | Status: AC
Start: 2024-03-12 — End: 2024-03-12
  Administered 2024-03-12: 20 mg via ORAL
  Filled 2024-03-12: qty 5

## 2024-03-12 MED ORDER — DIPHENHYDRAMINE HCL 25 MG PO CAPS
50.0000 mg | ORAL_CAPSULE | Freq: Once | ORAL | Status: AC
  Administered 2024-03-12: 50 mg via ORAL
  Filled 2024-03-12: qty 2

## 2024-03-12 NOTE — Patient Instructions (Signed)
 [facility] - A DEPT OF [place]  Discharge Instructions: Thank you for choosing [facility] to provide your oncology and hematology care.   If you have a lab appointment with the Cancer Center, please go directly to the Cancer Center and check in at the registration area.   Wear comfortable clothing and clothing appropriate for easy access to any Portacath or PICC line.   We strive to give you quality time with your provider. You may need to reschedule your appointment if you arrive late (15 or more minutes).  Arriving late affects you and other patients whose appointments are after yours.  Also, if you miss three or more appointments without notifying the office, you may be dismissed from the clinic at the provider's discretion.      For prescription refill requests, have your pharmacy contact our office and allow 72 hours for refills to be completed.    Today you received the following chemotherapy and/or immunotherapy agents darzalex faspro and zometa  and velcade      To help prevent nausea and vomiting after your treatment, we encourage you to take your nausea medication as directed.  BELOW ARE SYMPTOMS THAT SHOULD BE REPORTED IMMEDIATELY: *FEVER GREATER THAN 100.4 F (38 C) OR HIGHER *CHILLS OR SWEATING *NAUSEA AND VOMITING THAT IS NOT CONTROLLED WITH YOUR NAUSEA MEDICATION *UNUSUAL SHORTNESS OF BREATH *UNUSUAL BRUISING OR BLEEDING *URINARY PROBLEMS (pain or burning when urinating, or frequent urination) *BOWEL PROBLEMS (unusual diarrhea, constipation, pain near the anus) TENDERNESS IN MOUTH AND THROAT WITH OR WITHOUT PRESENCE OF ULCERS (sore throat, sores in mouth, or a toothache) UNUSUAL RASH, SWELLING OR PAIN  UNUSUAL VAGINAL DISCHARGE OR ITCHING   Items with * indicate a potential emergency and should be followed up as soon as possible or go to the Emergency Department if any problems should occur.  Please show the CHEMOTHERAPY  ALERT CARD or IMMUNOTHERAPY ALERT CARD at check-in to the Emergency Department and triage nurse.  Should you have questions after your visit or need to cancel or reschedule your appointment, please contact [facility] - A DEPT OF [facility]  Dept: [phone]  and follow the prompts.  Office hours are 8:00 a.m. to 4:30 p.m. Monday - Friday. Please note that voicemails left after 4:00 p.m. may not be returned until the following business day.  We are closed weekends and major holidays. You have access to a nurse at all times for urgent questions. Please call the main number to the clinic Dept: [phone] and follow the prompts.   For any non-urgent questions, you may also contact your provider using MyChart. We now offer e-Visits for anyone 58 and older to request care online for non-urgent symptoms. For details visit [email or web address].   Also download the MyChart app! Go to the app store, search "MyChart", open the app, select Home, and log in with your MyChart username and password.

## 2024-03-13 LAB — KAPPA/LAMBDA LIGHT CHAINS
Kappa free light chain: 11.6 mg/L (ref 3.3–19.4)
Kappa, lambda light chain ratio: 0.02 — ABNORMAL LOW (ref 0.26–1.65)
Lambda free light chains: 490.2 mg/L — ABNORMAL HIGH (ref 5.7–26.3)

## 2024-03-15 ENCOUNTER — Other Ambulatory Visit (HOSPITAL_COMMUNITY): Payer: Self-pay

## 2024-03-15 ENCOUNTER — Encounter: Payer: Self-pay | Admitting: Hematology

## 2024-03-16 ENCOUNTER — Ambulatory Visit: Admitting: Family Medicine

## 2024-03-16 ENCOUNTER — Encounter: Payer: Self-pay | Admitting: Family Medicine

## 2024-03-16 ENCOUNTER — Other Ambulatory Visit (HOSPITAL_COMMUNITY): Payer: Self-pay

## 2024-03-16 VITALS — BP 119/55 | HR 79 | Ht 67.0 in | Wt 281.8 lb

## 2024-03-16 DIAGNOSIS — C9000 Multiple myeloma not having achieved remission: Secondary | ICD-10-CM | POA: Diagnosis not present

## 2024-03-16 DIAGNOSIS — E876 Hypokalemia: Secondary | ICD-10-CM

## 2024-03-16 DIAGNOSIS — R600 Localized edema: Secondary | ICD-10-CM | POA: Diagnosis not present

## 2024-03-16 DIAGNOSIS — I10 Essential (primary) hypertension: Secondary | ICD-10-CM

## 2024-03-16 MED ORDER — POTASSIUM CHLORIDE CRYS ER 20 MEQ PO TBCR
20.0000 meq | EXTENDED_RELEASE_TABLET | Freq: Every day | ORAL | 1 refills | Status: DC
  Filled 2024-03-16: qty 30, 30d supply, fill #0
  Filled 2024-03-31 – 2024-04-08 (×2): qty 30, 30d supply, fill #1

## 2024-03-16 MED ORDER — FUROSEMIDE 40 MG PO TABS
40.0000 mg | ORAL_TABLET | Freq: Every day | ORAL | 1 refills | Status: DC
  Filled 2024-03-16: qty 30, 30d supply, fill #0
  Filled 2024-03-31 – 2024-04-05 (×2): qty 30, 30d supply, fill #1

## 2024-03-16 MED ORDER — HYDRALAZINE HCL 10 MG PO TABS
10.0000 mg | ORAL_TABLET | Freq: Three times a day (TID) | ORAL | 1 refills | Status: DC
  Filled 2024-03-16: qty 90, 30d supply, fill #0

## 2024-03-16 NOTE — Assessment & Plan Note (Addendum)
 Active treatment currently with oncology Monitor for improvement

## 2024-03-16 NOTE — Patient Instructions (Signed)
 Nice seeing you. I am glad your leg swelling improved some. I reviewed your lab from your oncology. Your Potassium is low, and your calcium  is high. Potassium may be low due to Torsemide . Hence, we will need to place you on a Potassium supplement. Torsemide , can help a little with your calcium . However, Lasix  will help better. Instead of increasing Torseminde to 20 mg daily, we will transition to Lasix  40 mg daily with Potassium.  Please change Hydralazine  to 10 mg TID. If BP remains in the low 100/60s, then reduce Hydralazine  to 10 mg BID, and if still low, discontinue Hydralayzine altogether. Please see me back in 2-4 weeks for close monitoring.

## 2024-03-16 NOTE — Assessment & Plan Note (Addendum)
 On Loop diuretics Kdur 20 mEq every day started Monitor closely Repeat lab in 2-3 weeks She agreed with the plan

## 2024-03-16 NOTE — Assessment & Plan Note (Addendum)
 Plan to increase Torsemide  to 20 mg QD from 10 mg Kidney function looks great K+ is low from a recent lab with mild hypercalcemia Discussed transitioning Torsemide  to Furosemide , which can also help with her high calcium  She agreed with the plan Start Furosemide  40 mg QD, equivalent to Torsemide  20 mg QD Start Kdur 20 MeQ QD Improve home hydration F/U in 2-3 for lab monitoring She agreed with the plan

## 2024-03-16 NOTE — Progress Notes (Signed)
    SUBJECTIVE:   CHIEF COMPLAINT / HPI:   HTN: Here for follow-up. She is compliant with hydralazine  25 mg TID, Losartan  50 mg QD, and Torsemide  10 mg QD. Home BP is usually in the one-teen systolic range with a diastolic range of 50-70. Denies any concerns.   Leg edema: Moderate improvement with Torsemide . She still feels tight and wants to increase her diuretic dose.  Electrolyte abnormality: Here for follow-up. Not currently on any supplements.  Multiple myeloma: She follows closely with oncology. She has gradually regained her strength and is able to walk a short distance. No new concerns.  PERTINENT  PMH / PSH: PMHx reviewed  OBJECTIVE:   BP (!) 119/55   Pulse 79   Ht 5\' 7"  (1.702 m)   Wt 281 lb 12.8 oz (127.8 kg)   LMP  (LMP Unknown)   SpO2 98%   BMI 44.14 kg/m   Physical Exam Vitals and nursing note reviewed.  Cardiovascular:     Rate and Rhythm: Normal rate and regular rhythm.     Heart sounds: Normal heart sounds. No murmur heard. Pulmonary:     Effort: Pulmonary effort is normal. No respiratory distress.     Breath sounds: Normal breath sounds. No wheezing.  Musculoskeletal:     Comments: B/L ++ ankle edema Gait slow and steady      ASSESSMENT/PLAN:   Assessment & Plan Edema leg Plan to increase Torsemide  to 20 mg QD from 10 mg Kidney function looks great K+ is low from a recent lab with mild hypercalcemia Discussed transitioning Torsemide  to Furosemide , which can also help with her high calcium  She agreed with the plan Start Furosemide  40 mg QD, equivalent to Torsemide  20 mg QD Start Kdur 20 MeQ QD Improve home hydration F/U in 2-3 for lab monitoring She agreed with the plan Essential hypertension   BP low normal systolic with pretty low diastolic Reduce Hydralazine  to 10 mg TID from 25 mg TID We transition her to Torsemide  20 mg to Lasix  40 mg QD ( was meant to increase from 10 to 20 mg Torsemide ) Advised to hold Hydralazine  for 3 days  after starting her Lasix  If SBP/DBP is >140/90, she will resume Hydralazine  at 10 mg TID BP parameter and medication adjustment plan were discussed, and she was able to read back instructions given Hypokalemia On Loop diuretics Kdur 20 mEq every day started Monitor closely Repeat lab in 2-3 weeks She agreed with the plan Hypercalcemia of malignancy Per the patient, already discussed with the oncologist Since she is on Torsemide  for leg edema, we agreed to switch to Furosemide  which can help more for calcium  excretion Monitor serum calcium  closely with oncology She agreed with the plan Multiple myeloma not having achieved remission [facility]) Active treatment currently with oncology Monitor for improvement    Penni Bowman, MD [facility]

## 2024-03-16 NOTE — Assessment & Plan Note (Addendum)
 Per the patient, already discussed with the oncologist Since she is on Torsemide  for leg edema, we agreed to switch to Furosemide  which can help more for calcium  excretion Monitor serum calcium  closely with oncology She agreed with the plan

## 2024-03-16 NOTE — Assessment & Plan Note (Addendum)
  BP low normal systolic with pretty low diastolic Reduce Hydralazine  to 10 mg TID from 25 mg TID We transition her to Torsemide  20 mg to Lasix  40 mg QD ( was meant to increase from 10 to 20 mg Torsemide ) Advised to hold Hydralazine  for 3 days after starting her Lasix  If SBP/DBP is >140/90, she will resume Hydralazine  at 10 mg TID BP parameter and medication adjustment plan were discussed, and she was able to read back instructions given

## 2024-03-17 ENCOUNTER — Encounter: Payer: Self-pay | Admitting: *Deleted

## 2024-03-17 ENCOUNTER — Telehealth: Payer: Self-pay | Admitting: *Deleted

## 2024-03-17 NOTE — Telephone Encounter (Signed)
 Ms. Viano requested a letter her daughter can share with her employer that states daughter provides transportation for her mother to come to [facility] for appointments (lab, provider, infusions). Ms. Brietzke states her PCP completed a form her daughter provided from her employer with this information. Requested she provide CC with same form if possible.  She states she will call her daughter for this and said a letter stating her daughter provides transportation for her to come to the [facility] for care might work in the meantime.  Patient will be at [facility] tomorrow 5/15 for appts and will see if she can bring form with her or if letter is needed at this time and let office now. Tentative letter pended in patient chart until can be reviewed by patient.

## 2024-03-18 ENCOUNTER — Inpatient Hospital Stay

## 2024-03-18 ENCOUNTER — Encounter: Payer: Self-pay | Admitting: Hematology

## 2024-03-18 VITALS — BP 142/64 | HR 82 | Temp 97.9°F | Resp 16

## 2024-03-18 DIAGNOSIS — C9000 Multiple myeloma not having achieved remission: Secondary | ICD-10-CM

## 2024-03-18 DIAGNOSIS — Z5111 Encounter for antineoplastic chemotherapy: Secondary | ICD-10-CM | POA: Diagnosis not present

## 2024-03-18 LAB — CBC WITH DIFFERENTIAL (CANCER CENTER ONLY)
Abs Immature Granulocytes: 0.01 10*3/uL (ref 0.00–0.07)
Basophils Absolute: 0 10*3/uL (ref 0.0–0.1)
Basophils Relative: 0 %
Eosinophils Absolute: 0.2 10*3/uL (ref 0.0–0.5)
Eosinophils Relative: 2 %
HCT: 34.7 % — ABNORMAL LOW (ref 36.0–46.0)
Hemoglobin: 11.1 g/dL — ABNORMAL LOW (ref 12.0–15.0)
Immature Granulocytes: 0 %
Lymphocytes Relative: 29 %
Lymphs Abs: 2 10*3/uL (ref 0.7–4.0)
MCH: 26.7 pg (ref 26.0–34.0)
MCHC: 32 g/dL (ref 30.0–36.0)
MCV: 83.6 fL (ref 80.0–100.0)
Monocytes Absolute: 0.5 10*3/uL (ref 0.1–1.0)
Monocytes Relative: 7 %
Neutro Abs: 4.3 10*3/uL (ref 1.7–7.7)
Neutrophils Relative %: 62 %
Platelet Count: 275 10*3/uL (ref 150–400)
RBC: 4.15 MIL/uL (ref 3.87–5.11)
RDW: 17 % — ABNORMAL HIGH (ref 11.5–15.5)
WBC Count: 7 10*3/uL (ref 4.0–10.5)
nRBC: 0 % (ref 0.0–0.2)

## 2024-03-18 LAB — CMP (CANCER CENTER ONLY)
ALT: 17 U/L (ref 0–44)
AST: 13 U/L — ABNORMAL LOW (ref 15–41)
Albumin: 3.9 g/dL (ref 3.5–5.0)
Alkaline Phosphatase: 47 U/L (ref 38–126)
Anion gap: 6 (ref 5–15)
BUN: 17 mg/dL (ref 6–20)
CO2: 30 mmol/L (ref 22–32)
Calcium: 9.7 mg/dL (ref 8.9–10.3)
Chloride: 104 mmol/L (ref 98–111)
Creatinine: 0.62 mg/dL (ref 0.44–1.00)
GFR, Estimated: >60 mL/min (ref >60)
Glucose, Bld: 119 mg/dL — ABNORMAL HIGH (ref 70–99)
Potassium: 3.8 mmol/L (ref 3.5–5.1)
Sodium: 140 mmol/L (ref 135–145)
Total Bilirubin: 0.3 mg/dL (ref 0.0–1.2)
Total Protein: 9.2 g/dL — ABNORMAL HIGH (ref 6.5–8.1)

## 2024-03-18 MED ORDER — FAMOTIDINE 20 MG PO TABS
20.0000 mg | ORAL_TABLET | Freq: Once | ORAL | Status: AC
  Administered 2024-03-18: 20 mg via ORAL
  Filled 2024-03-18: qty 1

## 2024-03-18 MED ORDER — ACETAMINOPHEN 325 MG PO TABS
650.0000 mg | ORAL_TABLET | Freq: Once | ORAL | Status: AC
  Administered 2024-03-18: 650 mg via ORAL
  Filled 2024-03-18: qty 2

## 2024-03-18 MED ORDER — BORTEZOMIB CHEMO SQ INJECTION 3.5 MG (2.5MG/ML)
1.5000 mg/m2 | Freq: Once | INTRAMUSCULAR | Status: AC
  Administered 2024-03-18: 3.75 mg via SUBCUTANEOUS
  Filled 2024-03-18: qty 1.5

## 2024-03-18 MED ORDER — DIPHENHYDRAMINE HCL 25 MG PO CAPS
50.0000 mg | ORAL_CAPSULE | Freq: Once | ORAL | Status: AC
  Administered 2024-03-18: 50 mg via ORAL
  Filled 2024-03-18: qty 2

## 2024-03-18 MED ORDER — DEXAMETHASONE 4 MG PO TABS
20.0000 mg | ORAL_TABLET | Freq: Once | ORAL | Status: AC
  Administered 2024-03-18: 20 mg via ORAL
  Filled 2024-03-18: qty 5

## 2024-03-18 MED ORDER — DARATUMUMAB-HYALURONIDASE-FIHJ 1800-30000 MG-UT/15ML ~~LOC~~ SOLN
1800.0000 mg | Freq: Once | SUBCUTANEOUS | Status: AC
  Administered 2024-03-18: 1800 mg via SUBCUTANEOUS
  Filled 2024-03-18: qty 15

## 2024-03-18 MED ORDER — MONTELUKAST SODIUM 10 MG PO TABS
10.0000 mg | ORAL_TABLET | Freq: Once | ORAL | Status: AC
  Administered 2024-03-18: 10 mg via ORAL
  Filled 2024-03-18: qty 1

## 2024-03-18 NOTE — Patient Instructions (Signed)
 [facility] - A DEPT OF [facility]  Discharge Instructions: Thank you for choosing [facility] to provide your oncology and hematology care.   If you have a lab appointment with the Cancer Center, please go directly to the Cancer Center and check in at the registration area.   Wear comfortable clothing and clothing appropriate for easy access to any Portacath or PICC line.   We strive to give you quality time with your provider. You may need to reschedule your appointment if you arrive late (15 or more minutes).  Arriving late affects you and other patients whose appointments are after yours.  Also, if you miss three or more appointments without notifying the office, you may be dismissed from the clinic at the provider's discretion.      For prescription refill requests, have your pharmacy contact our office and allow 72 hours for refills to be completed.    Today you received the following chemotherapy and/or immunotherapy agents: bortezomib and daratumumab-hyaluronidase-fihj      To help prevent nausea and vomiting after your treatment, we encourage you to take your nausea medication as directed.  BELOW ARE SYMPTOMS THAT SHOULD BE REPORTED IMMEDIATELY: *FEVER GREATER THAN 100.4 F (38 C) OR HIGHER *CHILLS OR SWEATING *NAUSEA AND VOMITING THAT IS NOT CONTROLLED WITH YOUR NAUSEA MEDICATION *UNUSUAL SHORTNESS OF BREATH *UNUSUAL BRUISING OR BLEEDING *URINARY PROBLEMS (pain or burning when urinating, or frequent urination) *BOWEL PROBLEMS (unusual diarrhea, constipation, pain near the anus) TENDERNESS IN MOUTH AND THROAT WITH OR WITHOUT PRESENCE OF ULCERS (sore throat, sores in mouth, or a toothache) UNUSUAL RASH, SWELLING OR PAIN  UNUSUAL VAGINAL DISCHARGE OR ITCHING   Items with * indicate a potential emergency and should be followed up as soon as possible or go to the Emergency Department if any problems should occur.  Please show the  CHEMOTHERAPY ALERT CARD or IMMUNOTHERAPY ALERT CARD at check-in to the Emergency Department and triage nurse.  Should you have questions after your visit or need to cancel or reschedule your appointment, please contact [facility] DEPT [facility]  Dept: [phone]  and follow the prompts.  Office hours are 8:00 a.m. to 4:30 p.m. Monday - Friday. Please note that voicemails left after 4:00 p.m. may not be returned until the following business day.  We are closed weekends and major holidays. You have access to a nurse at all times for urgent questions. Please call the main number to the clinic Dept: [phone] and follow the prompts.   For any non-urgent questions, you may also contact your provider using MyChart. We now offer e-Visits for anyone 79 and older to request care online for non-urgent symptoms. For details visit [email or web address].   Also download the MyChart app! Go to the app store, search "MyChart", open the app, select [name], and log in with your MyChart username and password.

## 2024-03-19 LAB — MULTIPLE MYELOMA PANEL, SERUM
Albumin SerPl Elph-Mcnc: 3.8 g/dL (ref 2.9–4.4)
Albumin/Glob SerPl: 0.7 (ref 0.7–1.7)
Alpha 1: 0.3 g/dL (ref 0.0–0.4)
Alpha2 Glob SerPl Elph-Mcnc: 1 g/dL (ref 0.4–1.0)
B-Globulin SerPl Elph-Mcnc: 1.1 g/dL (ref 0.7–1.3)
Gamma Glob SerPl Elph-Mcnc: 3.3 g/dL — ABNORMAL HIGH (ref 0.4–1.8)
Globulin, Total: 5.8 g/dL — ABNORMAL HIGH (ref 2.2–3.9)
IgA: 38 mg/dL — ABNORMAL LOW (ref 87–352)
IgG (Immunoglobin G), Serum: 4667 mg/dL — ABNORMAL HIGH (ref 586–1602)
IgM (Immunoglobulin M), Srm: 30 mg/dL (ref 26–217)
M Protein SerPl Elph-Mcnc: 3.2 g/dL — ABNORMAL HIGH
Total Protein ELP: 9.6 g/dL — ABNORMAL HIGH (ref 6.0–8.5)

## 2024-03-23 ENCOUNTER — Encounter: Payer: Self-pay | Admitting: Family Medicine

## 2024-03-24 ENCOUNTER — Inpatient Hospital Stay

## 2024-03-24 ENCOUNTER — Other Ambulatory Visit (HOSPITAL_COMMUNITY): Payer: Self-pay

## 2024-03-24 ENCOUNTER — Inpatient Hospital Stay (HOSPITAL_BASED_OUTPATIENT_CLINIC_OR_DEPARTMENT_OTHER): Admitting: Hematology

## 2024-03-24 ENCOUNTER — Ambulatory Visit (INDEPENDENT_AMBULATORY_CARE_PROVIDER_SITE_OTHER): Admitting: Family Medicine

## 2024-03-24 ENCOUNTER — Encounter (INDEPENDENT_AMBULATORY_CARE_PROVIDER_SITE_OTHER): Payer: Self-pay | Admitting: Family Medicine

## 2024-03-24 VITALS — BP 118/72 | HR 82 | Temp 97.7°F | Ht 61.0 in | Wt 281.0 lb

## 2024-03-24 VITALS — BP 163/47 | HR 79 | Temp 97.9°F | Resp 18 | Wt 285.0 lb

## 2024-03-24 DIAGNOSIS — E669 Obesity, unspecified: Secondary | ICD-10-CM | POA: Diagnosis not present

## 2024-03-24 DIAGNOSIS — R7303 Prediabetes: Secondary | ICD-10-CM | POA: Diagnosis not present

## 2024-03-24 DIAGNOSIS — Z5111 Encounter for antineoplastic chemotherapy: Secondary | ICD-10-CM

## 2024-03-24 DIAGNOSIS — C9000 Multiple myeloma not having achieved remission: Secondary | ICD-10-CM

## 2024-03-24 DIAGNOSIS — Z6841 Body Mass Index (BMI) 40.0 and over, adult: Secondary | ICD-10-CM

## 2024-03-24 DIAGNOSIS — R632 Polyphagia: Secondary | ICD-10-CM

## 2024-03-24 DIAGNOSIS — R600 Localized edema: Secondary | ICD-10-CM | POA: Diagnosis not present

## 2024-03-24 LAB — CMP (CANCER CENTER ONLY)
ALT: 11 U/L (ref 0–44)
AST: 10 U/L — ABNORMAL LOW (ref 15–41)
Albumin: 3.7 g/dL (ref 3.5–5.0)
Alkaline Phosphatase: 61 U/L (ref 38–126)
Anion gap: 5 (ref 5–15)
BUN: 14 mg/dL (ref 6–20)
CO2: 30 mmol/L (ref 22–32)
Calcium: 9 mg/dL (ref 8.9–10.3)
Chloride: 106 mmol/L (ref 98–111)
Creatinine: 0.59 mg/dL (ref 0.44–1.00)
GFR, Estimated: >60 mL/min (ref >60)
Glucose, Bld: 107 mg/dL — ABNORMAL HIGH (ref 70–99)
Potassium: 3.6 mmol/L (ref 3.5–5.1)
Sodium: 141 mmol/L (ref 135–145)
Total Bilirubin: 0.4 mg/dL (ref 0.0–1.2)
Total Protein: 7.7 g/dL (ref 6.5–8.1)

## 2024-03-24 LAB — CBC WITH DIFFERENTIAL (CANCER CENTER ONLY)
Abs Immature Granulocytes: 0.02 10*3/uL (ref 0.00–0.07)
Basophils Absolute: 0 10*3/uL (ref 0.0–0.1)
Basophils Relative: 0 %
Eosinophils Absolute: 0.1 10*3/uL (ref 0.0–0.5)
Eosinophils Relative: 2 %
HCT: 32.4 % — ABNORMAL LOW (ref 36.0–46.0)
Hemoglobin: 10.5 g/dL — ABNORMAL LOW (ref 12.0–15.0)
Immature Granulocytes: 0 %
Lymphocytes Relative: 29 %
Lymphs Abs: 1.8 10*3/uL (ref 0.7–4.0)
MCH: 26.8 pg (ref 26.0–34.0)
MCHC: 32.4 g/dL (ref 30.0–36.0)
MCV: 82.7 fL (ref 80.0–100.0)
Monocytes Absolute: 0.4 10*3/uL (ref 0.1–1.0)
Monocytes Relative: 7 %
Neutro Abs: 3.7 10*3/uL (ref 1.7–7.7)
Neutrophils Relative %: 62 %
Platelet Count: 265 10*3/uL (ref 150–400)
RBC: 3.92 MIL/uL (ref 3.87–5.11)
RDW: 17.2 % — ABNORMAL HIGH (ref 11.5–15.5)
WBC Count: 6.1 10*3/uL (ref 4.0–10.5)
nRBC: 0 % (ref 0.0–0.2)

## 2024-03-24 MED ORDER — DARATUMUMAB-HYALURONIDASE-FIHJ 1800-30000 MG-UT/15ML ~~LOC~~ SOLN
1800.0000 mg | Freq: Once | SUBCUTANEOUS | Status: AC
  Administered 2024-03-24: 1800 mg via SUBCUTANEOUS
  Filled 2024-03-24: qty 15

## 2024-03-24 MED ORDER — WEGOVY 0.5 MG/0.5ML ~~LOC~~ SOAJ
0.5000 mg | SUBCUTANEOUS | 0 refills | Status: DC
  Filled 2024-03-24: qty 2, 28d supply, fill #0

## 2024-03-24 MED ORDER — DIPHENHYDRAMINE HCL 25 MG PO CAPS
50.0000 mg | ORAL_CAPSULE | Freq: Once | ORAL | Status: AC
  Administered 2024-03-24: 50 mg via ORAL
  Filled 2024-03-24: qty 2

## 2024-03-24 MED ORDER — FAMOTIDINE 20 MG PO TABS
20.0000 mg | ORAL_TABLET | Freq: Once | ORAL | Status: AC
  Administered 2024-03-24: 20 mg via ORAL
  Filled 2024-03-24: qty 1

## 2024-03-24 MED ORDER — ACETAMINOPHEN 325 MG PO TABS
650.0000 mg | ORAL_TABLET | Freq: Once | ORAL | Status: AC
  Administered 2024-03-24: 650 mg via ORAL
  Filled 2024-03-24: qty 2

## 2024-03-24 MED ORDER — BORTEZOMIB CHEMO SQ INJECTION 3.5 MG (2.5MG/ML)
1.5000 mg/m2 | Freq: Once | INTRAMUSCULAR | Status: AC
  Administered 2024-03-24: 3.75 mg via SUBCUTANEOUS
  Filled 2024-03-24: qty 1.5

## 2024-03-24 MED ORDER — MONTELUKAST SODIUM 10 MG PO TABS
10.0000 mg | ORAL_TABLET | Freq: Once | ORAL | Status: AC
  Administered 2024-03-24: 10 mg via ORAL
  Filled 2024-03-24: qty 1

## 2024-03-24 MED ORDER — DEXAMETHASONE 4 MG PO TABS
20.0000 mg | ORAL_TABLET | Freq: Once | ORAL | Status: AC
  Administered 2024-03-24: 20 mg via ORAL
  Filled 2024-03-24: qty 5

## 2024-03-24 NOTE — Patient Instructions (Signed)
 [facility] - A DEPT OF [facility]  Discharge Instructions: Thank you for choosing [facility] to provide your oncology and hematology care.   If you have a lab appointment with the Cancer Center, please go directly to the Cancer Center and check in at the registration area.   Wear comfortable clothing and clothing appropriate for easy access to any Portacath or PICC line.   We strive to give you quality time with your provider. You may need to reschedule your appointment if you arrive late (15 or more minutes).  Arriving late affects you and other patients whose appointments are after yours.  Also, if you miss three or more appointments without notifying the office, you may be dismissed from the clinic at the provider's discretion.      For prescription refill requests, have your pharmacy contact our office and allow 72 hours for refills to be completed.    Today you received the following chemotherapy and/or immunotherapy agents: bortezomib and daratumumab-hyaluronidase-fihj      To help prevent nausea and vomiting after your treatment, we encourage you to take your nausea medication as directed.  BELOW ARE SYMPTOMS THAT SHOULD BE REPORTED IMMEDIATELY: *FEVER GREATER THAN 100.4 F (38 C) OR HIGHER *CHILLS OR SWEATING *NAUSEA AND VOMITING THAT IS NOT CONTROLLED WITH YOUR NAUSEA MEDICATION *UNUSUAL SHORTNESS OF BREATH *UNUSUAL BRUISING OR BLEEDING *URINARY PROBLEMS (pain or burning when urinating, or frequent urination) *BOWEL PROBLEMS (unusual diarrhea, constipation, pain near the anus) TENDERNESS IN MOUTH AND THROAT WITH OR WITHOUT PRESENCE OF ULCERS (sore throat, sores in mouth, or a toothache) UNUSUAL RASH, SWELLING OR PAIN  UNUSUAL VAGINAL DISCHARGE OR ITCHING   Items with * indicate a potential emergency and should be followed up as soon as possible or go to the Emergency Department if any problems should occur.  Please show the  CHEMOTHERAPY ALERT CARD or IMMUNOTHERAPY ALERT CARD at check-in to the Emergency Department and triage nurse.  Should you have questions after your visit or need to cancel or reschedule your appointment, please contact [facility] DEPT [facility]  Dept: [phone]  and follow the prompts.  Office hours are 8:00 a.m. to 4:30 p.m. Monday - Friday. Please note that voicemails left after 4:00 p.m. may not be returned until the following business day.  We are closed weekends and major holidays. You have access to a nurse at all times for urgent questions. Please call the main number to the clinic Dept: [phone] and follow the prompts.   For any non-urgent questions, you may also contact your provider using MyChart. We now offer e-Visits for anyone 79 and older to request care online for non-urgent symptoms. For details visit [email or web address].   Also download the MyChart app! Go to the app store, search "MyChart", open the app, select [name], and log in with your MyChart username and password.

## 2024-03-24 NOTE — Progress Notes (Signed)
 Office: [phone]  /  Fax: [phone]  WEIGHT SUMMARY AND BIOMETRICS  Anthropometric Measurements Height: 5\' 1"  (1.549 m) (rechecked height today) Weight: 281 lb (127.5 kg) BMI (Calculated): 53.12 Weight at Last Visit: 270 lb Weight Lost Since Last Visit: 0 Weight Gained Since Last Visit: 11 lb Starting Weight: 324 lb Total Weight Loss (lbs): 43 lb (19.5 kg) Peak Weight: 335 lb   Body Composition  Body Fat %: 70.7 % Fat Mass (lbs): 198.6 lbs Muscle Mass (lbs): 78.2 lbs Visceral Fat Rating : 28   Other Clinical Data Fasting: no Labs: no Today's Visit #: 45 Starting Date: 01/03/21 Comments: rechecked height today    Chief Complaint: OBESITY    History of Present Illness Carla Hanson "Carla Hanson" is a 58 year old female with obesity and multiple myeloma who presents for a follow-up on her weight management and cancer treatment.  She has lost eleven pounds in the last month since her last visit. She was previously prescribed Wegovy  for polyphagia but had not started it due to her multiple myeloma diagnosis. She is currently on metformin  for prediabetes, taking one pill daily. She wants to restart Wegovy  at 0.5 mg weekly, as both her primary care doctor and oncologist have approved it.  She has a relatively new diagnosis of multiple myeloma and is following closely with oncology. No side effects from chemotherapy are reported. Her blood work shows some electrolyte changes due to her medication. She is currently receiving weekly infusions, which take a couple of hours, and she may soon be able to receive them without the preliminary medication to counteract reactions.  She has been experiencing swelling in her legs, which has improved with the use of Lasix  once daily and potassium supplementation. The swelling is better than before.  She experiences anxiety related to PET scans, having had a severe anxiety attack after her last one. She is concerned about the  possibility of needing another scan.  She reports rib pain that has started since her multiple myeloma diagnosis. She describes it as feeling like muscle pain. She has increased her walking and is doing breathing exercises, which she believes may be contributing to the discomfort.  Her nutrition is focused on maintaining a healthy diet with an emphasis on protein and vegetables. She mentions eating smaller meals throughout the day, which works better for her.      PHYSICAL EXAM:  Blood pressure 118/72, pulse 82, temperature 97.7 F (36.5 C), height 5\' 1"  (1.549 m), weight 281 lb (127.5 kg), SpO2 98%. Body mass index is 53.09 kg/m.  DIAGNOSTIC DATA REVIEWED:  BMET    Component Value Date/Time   NA 140 03/18/2024 1331   NA 145 (H) 02/24/2024 1217   K 3.8 03/18/2024 1331   CL 104 03/18/2024 1331   CO2 30 03/18/2024 1331   GLUCOSE 119 (H) 03/18/2024 1331   BUN 17 03/18/2024 1331   BUN 16 02/24/2024 1217   CREATININE 0.62 03/18/2024 1331   CREATININE 0.57 11/09/2014 1032   CALCIUM  9.7 03/18/2024 1331   GFRNONAA >60 03/18/2024 1331   GFRNONAA >89 12/29/2013 0942   GFRAA 127 12/08/2019 1210   GFRAA >89 12/29/2013 0942   Lab Results  Component Value Date   HGBA1C 6.3 (H) 01/29/2024   HGBA1C 6.0 12/31/2012   Lab Results  Component Value Date   INSULIN  35.5 (H) 01/29/2024   INSULIN  77.3 (H) 01/03/2021   Lab Results  Component Value Date   TSH 0.736 01/29/2024   CBC  Component Value Date/Time   WBC 7.0 03/18/2024 1331   WBC 9.0 02/05/2024 0501   RBC 4.15 03/18/2024 1331   HGB 11.1 (L) 03/18/2024 1331   HGB 11.3 02/10/2024 1201   HCT 34.7 (L) 03/18/2024 1331   HCT 34.7 02/10/2024 1201   PLT 275 03/18/2024 1331   PLT 342 02/10/2024 1201   MCV 83.6 03/18/2024 1331   MCV 84 02/10/2024 1201   MCH 26.7 03/18/2024 1331   MCHC 32.0 03/18/2024 1331   RDW 17.0 (H) 03/18/2024 1331   RDW 15.0 02/10/2024 1201   Iron Studies    Component Value Date/Time   IRON 27  02/10/2024 1201   TIBC 245 (L) 02/10/2024 1201   FERRITIN 384 (H) 02/10/2024 1201   IRONPCTSAT 11 (L) 02/10/2024 1201   Lipid Panel     Component Value Date/Time   CHOL 143 02/10/2024 1201   TRIG 113 02/10/2024 1201   HDL 45 02/10/2024 1201   CHOLHDL 3.2 02/10/2024 1201   CHOLHDL 6.3 (H) 01/08/2017 0947   VLDL 40 (H) 01/08/2017 0947   LDLCALC 77 02/10/2024 1201   Hepatic Function Panel     Component Value Date/Time   PROT 9.2 (H) 03/18/2024 1331   PROT 8.2 02/24/2024 1217   ALBUMIN 3.9 03/18/2024 1331   ALBUMIN 3.6 (L) 02/24/2024 1217   AST 13 (L) 03/18/2024 1331   ALT 17 03/18/2024 1331   ALKPHOS 47 03/18/2024 1331   BILITOT 0.3 03/18/2024 1331      Component Value Date/Time   TSH 0.736 01/29/2024 1553   Nutritional Lab Results  Component Value Date   VD25OH 66.9 01/29/2024   VD25OH 70.4 07/29/2023   VD25OH 63.8 03/25/2023     Assessment and Plan Assessment & Plan Multiple myeloma Multiple myeloma is managed by oncology with regular follow-ups. No current chemotherapy side effects. Experiences anxiety with PET scans, potentially requiring premedication with anxiolytics. Receiving weekly infusions, adjustable based on response. - Continue oncology follow-up - Discuss anxiety management for future PET scans with oncology - Adjust infusion schedule as needed  Obesity Obesity management includes dietary modifications and exercise. Wegovy  was paused due to multiple myeloma but is approved for restart by primary care and oncology. Weight gain due to fluid retention is managed with Lasix . She states her oncologist and her PCP are both supportive of her being on Wegovy . - Restart Wegovy  at 0.5 mg weekly - Provide dietary guidance focusing on protein and vegetables - Provide category two eating plan and grocery list - Schedule monthly follow-up appointments  Polyphagia Polyphagia is managed with Wegovy , enhancing satiety. No gastrointestinal side effects reported with  previous use. Emphasis on maintaining balanced meals despite reduced appetite. - Restart Wegovy  at 0.5 mg weekly - Educate on the importance of balanced meals despite reduced appetite  Edema Leg edema has improved with Lasix  and potassium supplementation. Fluid retention is monitored. - Continue Lasix  once daily - Continue potassium supplementation - Reduce high sodium foods  Prediabetes Prediabetes is managed with metformin  and lifestyle modifications. Current regimen includes one pill of metformin  daily. - Continue metformin  - Reinforce dietary and exercise recommendations   She was informed of the importance of frequent follow up visits to maximize her success with intensive lifestyle modifications for her multiple health conditions.    Jasmine Mesi, MD

## 2024-03-24 NOTE — Progress Notes (Signed)
 Patient seen by Dr. Addison Naegeli are within treatment parameters.  Labs reviewed: and are within treatment parameters.  Per physician team, patient is ready for treatment and there are NO modifications to the treatment plan.

## 2024-03-24 NOTE — Progress Notes (Signed)
 HEMATOLOGY/ONCOLOGY CLINIC NOTE  Date of Service: 03/24/24  Patient Care Team: Arn Lane, MD as PCP - General (Family Medicine) Lisabeth Rider, MD as Consulting Physician (Neurology) Dorothe Gaster, RD as Dietitian (Family Medicine) Frankie Israel, MD as Consulting Physician (Hematology)  CHIEF COMPLAINTS/PURPOSE OF CONSULTATION:  Multiple myeloma  HISTORY OF PRESENTING ILLNESS:  Carla Hanson is a wonderful 58 y.o. 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   Patient has a history of hypertension, dyslipidemia, COPD, history of PE/DVT, prediabetes, obesity who notes that she was working out in the gym and had significant back pain resulting in an x-ray that showed T6 compression fracture in January 2025.   She had a follow-up with [facility] wellness clinic on 3/27 and reported symptoms of significant weakness dizziness lightheadedness and dehydration despite drinking a lot of water.  She also noted nausea but no emesis and constipation. Patient was seen in the emergency room and noted to have severe hypercalcemia with a calcium  level of 17.8 with an albumin of 3.8, total protein was elevated at 9.1 with an increased globulin gap, potassium was 2.7. 25-hydroxy vitamin D  levels were within normal limits at 66.9. TSH was normal at 0.736. PTH levels were appropriately suppressed at 6 which is suggestive of non hyperparathyroid hypercalcemia. CBC-on admission showed normal hemoglobin of 13.1 in the context of significant hemoconcentration.  This might be hiding her baseline anemia which might declare itself once the patient's volume status is replaced.  No other cytopenias. Patient notes no acute new focal bone pains.   Patient notes no fevers no chills no night sweats. No previous history of cancers. Her last mammogram was on 02/07/2023 and was within normal limits.  She is  previously noted to have a benign lipoma in the right breast which was resected. Patient has been ex-smoker and smoked half to 1 pack a day for 35 years and quit in December 2017.   New focal symptoms. Patient has received 1 dose of Zometa  and IV fluids and calcium  levels today are down to 12 with a corrected calcium  between 13 and 14. She is receiving IV fluids potassium replacement phosphorus  replacement and calcitonin today.   Normal labs have been ordered and patient is pending whole-body skeletal survey.  24-hour urine collection for myeloma has been sent out.   INTERVAL HISTORY:  Carla Hanson is a 58 y.o. female here for hospital follow-up for evaluation and management of multiple myeloma.   She was last seen by me on 03/12/2024 and reported upper abdominal spasms, typically occurring at night and mild aching bone pain localized over her right shoulder blade.   She presents today for toxicity check prior to cycle 1 day 15 of treatment.   Patient presents in wheelchair is accompanied by her daughter during today's visit. She reports that she has been doing well over the last couple of weeks.   She complains of stiffness/pain in her left side near the ribs. Patient denies feeling any bone pain necessarily. She denies any skin rash in the area. Her rib pain is present with movement. Use of back brace does improve symptoms.  Patient reports that she has tolerated treatment well with no major toxicity issues.   Patient complains of having anxiety issues during her PET scan in April.  She reports that she has been walking around more frequently.   She denies any  nausea, vomiting, tingling/numbness in the hands/feet, or back pain. Her energy levels have been normal.  Patient reports that she has been eating well and generally staying well hydrated, though she has not been particularly well-hydrated this week.  Patient reports that her leg swelling has improved. She reports that she has  been switched to 40 MG lasix  once daily.  MEDICAL HISTORY:  Past Medical History:  Diagnosis Date   Alcohol abuse    Anxiety    Back pain    Bipolar disorder (HCC)    Bronchitis    COPD exacerbation (HCC) 05/10/2016   Drug use    Edema 02/24/2024   History of blood clots    Hyperlipidemia    Hypertension    Influenza A 12/01/2018   Joint pain    Left leg DVT (HCC) 09/29/2013   Lipoma    Abdomen   LIPOMA 01/20/2008   Obesity    Occasional tremors 05/12/2019   Osteoarthritis of right knee    Other fatigue    Painful menstrual periods 01/05/2012   Pneumonia    Prediabetes    Seizure (HCC)    Shortness of breath on exertion    Stroke (HCC)    Tobacco abuse     SURGICAL HISTORY: Past Surgical History:  Procedure Laterality Date   CESAREAN SECTION     CYSTECTOMY     LIPOMA EXCISION  03/2011   ORIF ANKLE FRACTURE  03/13/2012   Procedure: OPEN REDUCTION INTERNAL FIXATION (ORIF) ANKLE FRACTURE;  Surgeon: Alphonso Jean, MD;  Location: [facility];  Service: Orthopedics;  Laterality: Right;   TUBAL LIGATION      SOCIAL HISTORY: Social History   Socioeconomic History   Marital status: Divorced    Spouse name: Not on file   Number of children: 4   Years of education: 10   Highest education level: Not on file  Occupational History   Occupation: stay at home  Tobacco Use   Smoking status: Former    Current packs/day: 0.00    Average packs/day: 0.5 packs/day for 35.0 years (17.5 ttl pk-yrs)    Types: Cigarettes    Start date: 11/04/1981    Quit date: 10/2016    Years since quitting: 7.4   Smokeless tobacco: Never  Vaping Use   Vaping status: Never Used  Substance and Sexual Activity   Alcohol use: No    Alcohol/week: 0.0 standard drinks of alcohol    Comment: Quit 10/2016   Drug use: Yes    Types: Marijuana   Sexual activity: Not Currently    Comment: tubal  Other Topics Concern   Not on file  Social History Narrative   Unemployed, 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  Considering nursing.    Lives at home alone   Right-handed   Caffeine: not much   Social Drivers of Corporate investment banker Strain: Low Risk  (11/25/2023)   Overall Financial Resource Strain (CARDIA)    Difficulty of Paying Living Expenses: Not hard at all  Food Insecurity: No Food Insecurity (02/06/2024)   Hunger Vital Sign    Worried About Running Out of Food in the Last Year: Never true    Ran Out of Food in the Last Year: Never true  Transportation Needs: No Transportation Needs (02/06/2024)   PRAPARE - Administrator, Civil Service (Medical): No    Lack of Transportation (Non-Medical): No  Physical Activity: Sufficiently Active (11/25/2023)   Exercise Vital Sign    Days of Exercise per Week: 5 days    Minutes of Exercise per Session: 60 min  Stress: No Stress Concern Present (11/25/2023)   [employer] of Occupational Health - Occupational Stress Questionnaire    Feeling of Stress : Not at all  Social Connections: Moderately Integrated (01/31/2024)   Social Connection and Isolation Panel [NHANES]    Frequency of Communication with Friends and Family: Three times a week    Frequency of Social Gatherings with Friends and Family: Once a week    Attends Religious Services: More than 4 times per year    Active Member of [employer] or Organizations: Yes    Attends Banker Meetings: Patient declined    Marital Status: Divorced  Catering manager Violence: Not At Risk (02/06/2024)   Humiliation, Afraid, Rape, and Kick questionnaire    Fear of Current or Ex-Partner: No    Emotionally Abused: No    Physically Abused: No    Sexually Abused: No    FAMILY HISTORY: Family History  Problem Relation Age of Onset   Other Mother        High blood pressure runs in the family   Obesity Mother    Asthma Father    Heart attack Father    Obesity Father    Diabetes  Maternal Aunt    Breast cancer Neg Hx     ALLERGIES:  is allergic to latuda  [lurasidone ].  MEDICATIONS:  Current Outpatient Medications  Medication Sig Dispense Refill   acetaminophen  (TYLENOL ) 325 MG tablet Take 650 mg by mouth every 6 (six) hours as needed for mild pain (pain score 1-3).     acyclovir  (ZOVIRAX ) 400 MG tablet Take 1 tablet (400 mg total) by mouth 2 (two) times daily. 60 tablet 5   albuterol  (PROVENTIL ) (2.5 MG/3ML) 0.083% nebulizer solution Take 3 mLs (2.5 mg total) by nebulization every 6 (six) hours as needed for wheezing or shortness of breath. 75 mL 2   albuterol  (VENTOLIN  HFA) 108 (90 Base) MCG/ACT inhaler Inhale 2 puffs into the lungs every 6 (six) hours as needed for wheezing or shortness of breath. 18 g 4   aspirin  EC 81 MG EC tablet Take 1 tablet (81 mg total) by mouth daily. 30 tablet 12   cetirizine  (ZYRTEC ) 10 MG tablet Take 1 tablet (10 mg total) by mouth daily. 90 tablet 1   dexamethasone  (DECADRON ) 4 MG tablet Take 5 tabs (20 mg) weekly the day after daratumumab  for 12 weeks. Take with breakfast. 20 tablet 5   ferrous sulfate  325 (65 FE) MG tablet Take 325 mg by mouth daily with breakfast.     furosemide  (LASIX ) 40 MG tablet Take 1 tablet (40 mg total) by mouth daily. 30 tablet 1   hydrALAZINE  (APRESOLINE ) 10 MG tablet Take 1 tablet (10 mg total) by mouth 3 (three) times daily. 90 tablet 1   losartan  (COZAAR ) 50 MG tablet Take 1 tablet (50 mg total) by mouth daily. 90 tablet 0   metFORMIN  (GLUCOPHAGE ) 500 MG tablet Take 1 tablet (500 mg total) by mouth daily with breakfast.     ondansetron  (ZOFRAN ) 8 MG tablet Take 1 tablet (8 mg total) by mouth every 8 (eight) hours as needed for nausea or vomiting. 30 tablet 1   potassium chloride  SA (KLOR-CON  M) 20 MEQ tablet Take 1 tablet (20 mEq total) by mouth daily. 30 tablet 1   prochlorperazine  (COMPAZINE ) 10 MG tablet  Take 1 tablet (10 mg total) by mouth every 6 (six) hours as needed for nausea or vomiting. 30 tablet  1   rosuvastatin  (CRESTOR ) 20 MG tablet TAKE 1 TABLET(20 MG) BY MOUTH DAILY 90 tablet 1   Semaglutide -Weight Management (WEGOVY ) 0.5 MG/0.5ML SOAJ Inject 0.5 mg into the skin once a week. 2 mL 0   tiZANidine  (ZANAFLEX ) 4 MG tablet Take 1 tablet (4 mg total) by mouth 2 (two) times daily as needed for muscle spasms. 60 tablet 1   No current facility-administered medications for this visit.    REVIEW OF SYSTEMS:    10 Point review of Systems was done is negative except as noted above.   PHYSICAL EXAMINATION: ECOG PERFORMANCE STATUS: 2  . There were no vitals filed for this visit.   There were no vitals filed for this visit.   .There is no height or weight on file to calculate BMI.   GENERAL:alert, in no acute distress and comfortable SKIN: no acute rashes, no significant lesions EYES: conjunctiva are pink and non-injected, sclera anicteric OROPHARYNX: MMM, no exudates, no oropharyngeal erythema or ulceration NECK: supple, no JVD LYMPH:  no palpable lymphadenopathy in the cervical, axillary or inguinal regions LUNGS: clear to auscultation b/l with normal respiratory effort HEART: regular rate & rhythm ABDOMEN:  normoactive bowel sounds , non tender, not distended. Extremity: no pedal edema PSYCH: alert & oriented x 3 with fluent speech NEURO: no focal motor/sensory deficits   LABORATORY DATA:  I have reviewed the data as listed  .    Latest Ref Rng & Units 03/18/2024    1:31 PM 03/12/2024    8:30 AM 02/10/2024   12:01 PM  CBC  WBC 4.0 - 10.5 K/uL 7.0  8.6  7.7   Hemoglobin 12.0 - 15.0 g/dL 40.9  9.8  81.1   Hematocrit 36.0 - 46.0 % 34.7  30.1  34.7   Platelets 150 - 400 K/uL 275  315  342     .    Latest Ref Rng & Units 03/18/2024    1:31 PM 03/12/2024    8:30 AM 02/24/2024   12:17 PM  CMP  Glucose 70 - 99 mg/dL 914  782  95   BUN 6 - 20 mg/dL 17  16  16    Creatinine 0.44 - 1.00 mg/dL 9.56  2.13  0.86   Sodium 135 - 145 mmol/L 140  140  145   Potassium 3.5 - 5.1  mmol/L 3.8  3.4  4.7   Chloride 98 - 111 mmol/L 104  104  106   CO2 22 - 32 mmol/L 30  32  19   Calcium  8.9 - 10.3 mg/dL 9.7  57.8  9.9   Total Protein 6.5 - 8.1 g/dL 9.2  46.9  8.2   Total Bilirubin 0.0 - 1.2 mg/dL 0.3  0.3  <6.2   Alkaline Phos 38 - 126 U/L 47  48  97   AST 15 - 41 U/L 13  18  18    ALT 0 - 44 U/L 17  15  12     02/05/2024 Bone Marrow Biopsy:       RADIOGRAPHIC STUDIES: I have personally reviewed the radiological images as listed and agreed with the findings in the report. No results found.   ASSESSMENT & PLAN:   58 year old female with severe non-PTH hypercalcemia with an admission calcium  level of 17.8    1) Severe non-PTH hypercalcemia of malignancy related to Multiple myeloma Calcium  level 17.8 on  admission much improved with corrected calcium  of around 11 PTH appropriately suppressed with a PTH level of 6. Patient has a globulin gap and Anemia and bone survey shows multiple bone lesions, Overall presentation is certainly concerning for multiple myeloma. Other paraneoplastic hypercalcemia is also possibility. 25-hydroxy vitamin D  levels are within normal limits at 66. Status post 1 dose of Zometa    #2 dehydration and volume contraction due to hypercalcemia.   #3  Hypertension #4 dyslipidemia #5 COPD #6 prediabetes #7 multiple electrolyte abnormalities including hypokalemia, hypophosphatemia and severe hypercalcemia. #8 multiple osseous lesions concerning for multiple myeloma.  Also noted to have left femoral intertrochanteric lesion. #9 history of T6 compression fracture was likely related to multiple myeloma.  Mild pain at this time.  PLAN:  -Discussed lab results on 03/24/24 in detail with patient. CBC showed WBC of 6.1K, hemoglobin of 10.5, and platelets of 265K. -Blood counts are stabilizing -her anemia is very mild with hgb 10.5 which should not be limiting -calcium  normalized at 9, from previous peak of 17  -total proteins have improved  which usually suggests that her globulin fraction has come down -her total protein level was previously greater than 10 less than 2 weeks ago, and has come down quickly to 7.7 today -her M protein was noted to be 3.2 g/dL at the start of treatment. We will continue to track her M protein once a cycle.  -discussed goal in 4-6 cycles for M protein to drop from 3.2 g/dL to dropping by more than 90% reduction in the number of cancer cells -kidney functions remain normal, all other electrolytes normal -reviewed her PET scan from 02/12/2024, which did show some spots in the ribs -reviewed PET scan images from April with patient and her daughter in depth -discussed option of anxiety medication with her next PET scan down the line if needed -continue Daratumumab /Dexamethasone   -patient is appropriate to proceed with cycle 1 day 15 of treatment today -continue acyclovir  to prevent shingles  -discussed option of consideration of radiation therapy down the line sometimes if needed  -discussed option to try Sumifun lidocaine  patch or OTC tylenol  for left rib pain management -recommend eating as well as possible, staying well-hydrated, and staying physically active to keep her strength and core muscles up -recommend taking infection precautions including wearing masks in public spaces -continue to keep legs elevated -will provide FMLA paperwork -answered all of patient's and her daughter's questions in detail  FOLLOW-UP: ***  The total time spent in the appointment was *** minutes* .  All of the patient's questions were answered with apparent satisfaction. The patient knows to call the clinic with any problems, questions or concerns.   Jacquelyn Matt MD MS AAHIVMS [facility] Hematology/Oncology Physician [facility]  .*Total Encounter Time as defined by the Centers for Medicare and Medicaid Services includes, in addition to the face-to-face time of a patient visit (documented in the note  above) non-face-to-face time: obtaining and reviewing outside history, ordering and reviewing medications, tests or procedures, care coordination (communications with other health care professionals or caregivers) and documentation in the medical record.    I,Mitra Faeizi,acting as a Neurosurgeon for Jacquelyn Matt, MD.,have documented all relevant documentation on the behalf of Jacquelyn Matt, MD,as directed by  Jacquelyn Matt, MD while in the presence of Jacquelyn Matt, MD.  ***

## 2024-03-25 ENCOUNTER — Other Ambulatory Visit (HOSPITAL_COMMUNITY): Payer: Self-pay

## 2024-03-25 ENCOUNTER — Other Ambulatory Visit (INDEPENDENT_AMBULATORY_CARE_PROVIDER_SITE_OTHER): Payer: Self-pay | Admitting: Family Medicine

## 2024-03-25 DIAGNOSIS — R7303 Prediabetes: Secondary | ICD-10-CM

## 2024-03-25 MED ORDER — PROCHLORPERAZINE MALEATE 10 MG PO TABS
10.0000 mg | ORAL_TABLET | Freq: Four times a day (QID) | ORAL | 1 refills | Status: DC | PRN
  Filled 2024-03-25 – 2024-06-03 (×2): qty 30, 8d supply, fill #0

## 2024-03-25 MED ORDER — ONDANSETRON HCL 8 MG PO TABS
8.0000 mg | ORAL_TABLET | Freq: Three times a day (TID) | ORAL | 1 refills | Status: DC | PRN
  Filled 2024-03-25: qty 30, 10d supply, fill #0

## 2024-03-25 MED ORDER — DEXAMETHASONE 4 MG PO TABS
20.0000 mg | ORAL_TABLET | ORAL | 5 refills | Status: DC
  Filled 2024-03-25: qty 20, 30d supply, fill #0
  Filled 2024-04-05 – 2024-04-07 (×2): qty 20, 30d supply, fill #1

## 2024-03-25 MED ORDER — HYDROXYZINE HCL 25 MG PO TABS
25.0000 mg | ORAL_TABLET | Freq: Every day | ORAL | 1 refills | Status: DC
  Filled 2024-03-25: qty 30, 30d supply, fill #0

## 2024-03-25 MED ORDER — LOSARTAN POTASSIUM 50 MG PO TABS
50.0000 mg | ORAL_TABLET | Freq: Every day | ORAL | 1 refills | Status: DC
  Filled 2024-03-25 – 2024-05-02 (×2): qty 30, 30d supply, fill #0

## 2024-03-25 MED ORDER — CALCITONIN (SALMON) 200 UNIT/ACT NA SOLN
1.0000 | Freq: Every day | NASAL | 1 refills | Status: DC
  Filled 2024-03-25: qty 3.7, 41d supply, fill #0

## 2024-03-25 MED ORDER — ALBUTEROL SULFATE (2.5 MG/3ML) 0.083% IN NEBU
3.0000 mL | INHALATION_SOLUTION | Freq: Four times a day (QID) | RESPIRATORY_TRACT | 2 refills | Status: DC | PRN
  Filled 2024-03-25: qty 75, 7d supply, fill #0

## 2024-03-25 MED ORDER — ACYCLOVIR 400 MG PO TABS
400.0000 mg | ORAL_TABLET | Freq: Two times a day (BID) | ORAL | 5 refills | Status: DC
  Filled 2024-03-25 – 2024-03-31 (×3): qty 60, 30d supply, fill #0
  Filled 2024-04-26: qty 60, 30d supply, fill #1
  Filled 2024-05-04 – 2024-05-26 (×2): qty 60, 30d supply, fill #2
  Filled 2024-06-09 – 2024-06-25 (×2): qty 60, 30d supply, fill #3
  Filled 2024-07-21: qty 60, 30d supply, fill #4

## 2024-03-25 MED ORDER — METFORMIN HCL 500 MG PO TABS
500.0000 mg | ORAL_TABLET | Freq: Three times a day (TID) | ORAL | 1 refills | Status: DC
  Filled 2024-03-25: qty 90, 30d supply, fill #0

## 2024-03-25 MED ORDER — HYDROCHLOROTHIAZIDE 25 MG PO TABS
25.0000 mg | ORAL_TABLET | Freq: Every day | ORAL | 1 refills | Status: DC
  Filled 2024-03-25: qty 30, 30d supply, fill #0

## 2024-03-25 MED ORDER — LIDOCAINE 5 % EX PTCH
1.0000 | MEDICATED_PATCH | Freq: Two times a day (BID) | CUTANEOUS | 2 refills | Status: DC
  Filled 2024-03-25: qty 20, 10d supply, fill #0

## 2024-03-25 MED ORDER — METFORMIN HCL 500 MG PO TABS
500.0000 mg | ORAL_TABLET | Freq: Three times a day (TID) | ORAL | 1 refills | Status: DC
  Filled 2024-03-25: qty 270, 90d supply, fill #0

## 2024-03-25 NOTE — Telephone Encounter (Signed)
 Please check if this was already refilled

## 2024-03-25 NOTE — Telephone Encounter (Signed)
 Please rfill x 1

## 2024-03-25 NOTE — Telephone Encounter (Signed)
 5/*22 pt needs a refill on metformin , she was in for a appt yesterday and filled the wagovey but not the metformin 

## 2024-03-26 ENCOUNTER — Other Ambulatory Visit (HOSPITAL_COMMUNITY): Payer: Self-pay

## 2024-03-30 ENCOUNTER — Telehealth: Payer: Self-pay

## 2024-03-30 ENCOUNTER — Encounter: Payer: Self-pay | Admitting: Hematology

## 2024-03-30 DIAGNOSIS — D509 Iron deficiency anemia, unspecified: Secondary | ICD-10-CM | POA: Diagnosis not present

## 2024-03-30 DIAGNOSIS — J449 Chronic obstructive pulmonary disease, unspecified: Secondary | ICD-10-CM | POA: Diagnosis not present

## 2024-03-30 DIAGNOSIS — E860 Dehydration: Secondary | ICD-10-CM | POA: Diagnosis not present

## 2024-03-30 DIAGNOSIS — R7303 Prediabetes: Secondary | ICD-10-CM | POA: Diagnosis not present

## 2024-03-30 DIAGNOSIS — I10 Essential (primary) hypertension: Secondary | ICD-10-CM | POA: Diagnosis not present

## 2024-03-30 DIAGNOSIS — M1711 Unilateral primary osteoarthritis, right knee: Secondary | ICD-10-CM | POA: Diagnosis not present

## 2024-03-30 DIAGNOSIS — G8929 Other chronic pain: Secondary | ICD-10-CM | POA: Diagnosis not present

## 2024-03-30 DIAGNOSIS — F319 Bipolar disorder, unspecified: Secondary | ICD-10-CM | POA: Diagnosis not present

## 2024-03-30 DIAGNOSIS — Z7984 Long term (current) use of oral hypoglycemic drugs: Secondary | ICD-10-CM | POA: Diagnosis not present

## 2024-03-30 DIAGNOSIS — Z8673 Personal history of transient ischemic attack (TIA), and cerebral infarction without residual deficits: Secondary | ICD-10-CM | POA: Diagnosis not present

## 2024-03-30 DIAGNOSIS — C9000 Multiple myeloma not having achieved remission: Secondary | ICD-10-CM | POA: Diagnosis not present

## 2024-03-30 DIAGNOSIS — S72142D Displaced intertrochanteric fracture of left femur, subsequent encounter for closed fracture with routine healing: Secondary | ICD-10-CM | POA: Diagnosis not present

## 2024-03-30 DIAGNOSIS — E876 Hypokalemia: Secondary | ICD-10-CM | POA: Diagnosis not present

## 2024-03-30 DIAGNOSIS — M25512 Pain in left shoulder: Secondary | ICD-10-CM | POA: Diagnosis not present

## 2024-03-30 DIAGNOSIS — S22000D Wedge compression fracture of unspecified thoracic vertebra, subsequent encounter for fracture with routine healing: Secondary | ICD-10-CM | POA: Diagnosis not present

## 2024-03-30 DIAGNOSIS — M25511 Pain in right shoulder: Secondary | ICD-10-CM | POA: Diagnosis not present

## 2024-03-30 DIAGNOSIS — Z7982 Long term (current) use of aspirin: Secondary | ICD-10-CM | POA: Diagnosis not present

## 2024-03-30 DIAGNOSIS — Z86711 Personal history of pulmonary embolism: Secondary | ICD-10-CM | POA: Diagnosis not present

## 2024-03-30 DIAGNOSIS — Z86718 Personal history of other venous thrombosis and embolism: Secondary | ICD-10-CM | POA: Diagnosis not present

## 2024-03-30 DIAGNOSIS — Z6841 Body Mass Index (BMI) 40.0 and over, adult: Secondary | ICD-10-CM | POA: Diagnosis not present

## 2024-03-30 DIAGNOSIS — F419 Anxiety disorder, unspecified: Secondary | ICD-10-CM | POA: Diagnosis not present

## 2024-03-30 DIAGNOSIS — E785 Hyperlipidemia, unspecified: Secondary | ICD-10-CM | POA: Diagnosis not present

## 2024-03-30 MED ORDER — METFORMIN HCL 500 MG PO TABS: 500.0000 mg | ORAL_TABLET | Freq: Three times a day (TID) | ORAL | 1 refills | Status: DC

## 2024-03-30 NOTE — Telephone Encounter (Signed)
 Notified the pt regarding her daughter FMLA form being completed,faxed,and confirmation received. Pt stated that she will pick up her daughter copy on Thursday 04/01/2024. No questions ir concerns at this time.

## 2024-03-30 NOTE — Telephone Encounter (Signed)
 Pt daughter called in checking on the status of her FMLA forms regarding Carla Hanson. I state that the forms have not been started on . The daughter stated that she brought the form in on 5/16. I told her that I seen that they were received on 5/20. She was very adamant about the 5/16 date. She wanted to speak to some one other that me. I told her I was the only one here and she didn't want to talk to me any more. I let her know that she can speak with my supervisor and I gave her the number. I did go back and look at the spread sheet and notice that the patient name was on there twice and I did see the 5/16 date. That date is her FMLA form and not the patient who she called and enquire about. I tried calling her back but her number is disconnected.

## 2024-04-01 ENCOUNTER — Other Ambulatory Visit (HOSPITAL_COMMUNITY): Payer: Self-pay

## 2024-04-01 ENCOUNTER — Inpatient Hospital Stay

## 2024-04-01 VITALS — BP 161/71 | HR 90 | Temp 98.7°F

## 2024-04-01 DIAGNOSIS — Z5111 Encounter for antineoplastic chemotherapy: Secondary | ICD-10-CM | POA: Diagnosis not present

## 2024-04-01 DIAGNOSIS — C9000 Multiple myeloma not having achieved remission: Secondary | ICD-10-CM

## 2024-04-01 LAB — CMP (CANCER CENTER ONLY)
ALT: 9 U/L (ref 0–44)
AST: 9 U/L — ABNORMAL LOW (ref 15–41)
Albumin: 4 g/dL (ref 3.5–5.0)
Alkaline Phosphatase: 85 U/L (ref 38–126)
Anion gap: 6 (ref 5–15)
BUN: 19 mg/dL (ref 6–20)
CO2: 32 mmol/L (ref 22–32)
Calcium: 9.1 mg/dL (ref 8.9–10.3)
Chloride: 104 mmol/L (ref 98–111)
Creatinine: 0.53 mg/dL (ref 0.44–1.00)
GFR, Estimated: >60 mL/min (ref >60)
Glucose, Bld: 107 mg/dL — ABNORMAL HIGH (ref 70–99)
Potassium: 3.7 mmol/L (ref 3.5–5.1)
Sodium: 142 mmol/L (ref 135–145)
Total Bilirubin: 0.4 mg/dL (ref 0.0–1.2)
Total Protein: 7.4 g/dL (ref 6.5–8.1)

## 2024-04-01 LAB — CBC WITH DIFFERENTIAL (CANCER CENTER ONLY)
Abs Immature Granulocytes: 0.01 10*3/uL (ref 0.00–0.07)
Basophils Absolute: 0 10*3/uL (ref 0.0–0.1)
Basophils Relative: 0 %
Eosinophils Absolute: 0.1 10*3/uL (ref 0.0–0.5)
Eosinophils Relative: 2 %
HCT: 32.4 % — ABNORMAL LOW (ref 36.0–46.0)
Hemoglobin: 10.4 g/dL — ABNORMAL LOW (ref 12.0–15.0)
Immature Granulocytes: 0 %
Lymphocytes Relative: 22 %
Lymphs Abs: 1.4 10*3/uL (ref 0.7–4.0)
MCH: 27.1 pg (ref 26.0–34.0)
MCHC: 32.1 g/dL (ref 30.0–36.0)
MCV: 84.4 fL (ref 80.0–100.0)
Monocytes Absolute: 0.3 10*3/uL (ref 0.1–1.0)
Monocytes Relative: 6 %
Neutro Abs: 4.4 10*3/uL (ref 1.7–7.7)
Neutrophils Relative %: 70 %
Platelet Count: 246 10*3/uL (ref 150–400)
RBC: 3.84 MIL/uL — ABNORMAL LOW (ref 3.87–5.11)
RDW: 17.6 % — ABNORMAL HIGH (ref 11.5–15.5)
WBC Count: 6.2 10*3/uL (ref 4.0–10.5)
nRBC: 0 % (ref 0.0–0.2)

## 2024-04-01 MED ORDER — FAMOTIDINE 20 MG PO TABS
20.0000 mg | ORAL_TABLET | Freq: Once | ORAL | Status: AC
  Administered 2024-04-01: 20 mg via ORAL
  Filled 2024-04-01: qty 1

## 2024-04-01 MED ORDER — DEXAMETHASONE 4 MG PO TABS
20.0000 mg | ORAL_TABLET | Freq: Once | ORAL | Status: AC
  Administered 2024-04-01: 20 mg via ORAL
  Filled 2024-04-01: qty 5

## 2024-04-01 MED ORDER — ACETAMINOPHEN 325 MG PO TABS
650.0000 mg | ORAL_TABLET | Freq: Once | ORAL | Status: AC
  Administered 2024-04-01: 650 mg via ORAL
  Filled 2024-04-01: qty 2

## 2024-04-01 MED ORDER — BORTEZOMIB CHEMO SQ INJECTION 3.5 MG (2.5MG/ML)
1.5000 mg/m2 | Freq: Once | INTRAMUSCULAR | Status: AC
  Administered 2024-04-01: 3.75 mg via SUBCUTANEOUS
  Filled 2024-04-01: qty 1.5

## 2024-04-01 MED ORDER — DIPHENHYDRAMINE HCL 25 MG PO CAPS
50.0000 mg | ORAL_CAPSULE | Freq: Once | ORAL | Status: AC
  Administered 2024-04-01: 50 mg via ORAL
  Filled 2024-04-01: qty 2

## 2024-04-01 MED ORDER — DARATUMUMAB-HYALURONIDASE-FIHJ 1800-30000 MG-UT/15ML ~~LOC~~ SOLN
1800.0000 mg | Freq: Once | SUBCUTANEOUS | Status: AC
  Administered 2024-04-01: 1800 mg via SUBCUTANEOUS
  Filled 2024-04-01: qty 15

## 2024-04-01 NOTE — Patient Instructions (Signed)
 [facility] - A DEPT OF [facility]  Discharge Instructions: Thank you for choosing [facility] to provide your oncology and hematology care.   If you have a lab appointment with the Cancer Center, please go directly to the Cancer Center and check in at the registration area.   Wear comfortable clothing and clothing appropriate for easy access to any Portacath or PICC line.   We strive to give you quality time with your provider. You may need to reschedule your appointment if you arrive late (15 or more minutes).  Arriving late affects you and other patients whose appointments are after yours.  Also, if you miss three or more appointments without notifying the office, you may be dismissed from the clinic at the provider's discretion.      For prescription refill requests, have your pharmacy contact our office and allow 72 hours for refills to be completed.    Today you received the following chemotherapy and/or immunotherapy agents: bortezomib  and daratumumab       To help prevent nausea and vomiting after your treatment, we encourage you to take your nausea medication as directed.  BELOW ARE SYMPTOMS THAT SHOULD BE REPORTED IMMEDIATELY: *FEVER GREATER THAN 100.4 F (38 C) OR HIGHER *CHILLS OR SWEATING *NAUSEA AND VOMITING THAT IS NOT CONTROLLED WITH YOUR NAUSEA MEDICATION *UNUSUAL SHORTNESS OF BREATH *UNUSUAL BRUISING OR BLEEDING *URINARY PROBLEMS (pain or burning when urinating, or frequent urination) *BOWEL PROBLEMS (unusual diarrhea, constipation, pain near the anus) TENDERNESS IN MOUTH AND THROAT WITH OR WITHOUT PRESENCE OF ULCERS (sore throat, sores in mouth, or a toothache) UNUSUAL RASH, SWELLING OR PAIN  UNUSUAL VAGINAL DISCHARGE OR ITCHING   Items with * indicate a potential emergency and should be followed up as soon as possible or go to the Emergency Department if any problems should occur.  Please show the CHEMOTHERAPY ALERT CARD  or IMMUNOTHERAPY ALERT CARD at check-in to the Emergency Department and triage nurse.  Should you have questions after your visit or need to cancel or reschedule your appointment, please contact [facility] - A DEPT OF [facility]  Dept: [phone]  and follow the prompts.  Office hours are 8:00 a.m. to 4:30 p.m. Monday - Friday. Please note that voicemails left after 4:00 p.m. may not be returned until the following business day.  We are closed weekends and major holidays. You have access to a nurse at all times for urgent questions. Please call the main number to the clinic Dept: [phone] and follow the prompts.   For any non-urgent questions, you may also contact your provider using MyChart. We now offer e-Visits for anyone 7 and older to request care online for non-urgent symptoms. For details visit [email or web address].   Also download the MyChart app! Go to the app store, search "MyChart", open the app, select [facility], and log in with your MyChart username and password.

## 2024-04-02 LAB — KAPPA/LAMBDA LIGHT CHAINS
Kappa free light chain: 6.3 mg/L (ref 3.3–19.4)
Kappa, lambda light chain ratio: 0.38 (ref 0.26–1.65)
Lambda free light chains: 16.5 mg/L (ref 5.7–26.3)

## 2024-04-05 LAB — MULTIPLE MYELOMA PANEL, SERUM
Albumin SerPl Elph-Mcnc: 3.3 g/dL (ref 2.9–4.4)
Albumin/Glob SerPl: 1 (ref 0.7–1.7)
Alpha 1: 0.2 g/dL (ref 0.0–0.4)
Alpha2 Glob SerPl Elph-Mcnc: 0.9 g/dL (ref 0.4–1.0)
B-Globulin SerPl Elph-Mcnc: 1 g/dL (ref 0.7–1.3)
Gamma Glob SerPl Elph-Mcnc: 1.4 g/dL (ref 0.4–1.8)
Globulin, Total: 3.6 g/dL (ref 2.2–3.9)
IgA: 21 mg/dL — ABNORMAL LOW (ref 87–352)
IgG (Immunoglobin G), Serum: 1630 mg/dL — ABNORMAL HIGH (ref 586–1602)
IgM (Immunoglobulin M), Srm: 20 mg/dL — ABNORMAL LOW (ref 26–217)
M Protein SerPl Elph-Mcnc: 1.2 g/dL — ABNORMAL HIGH
Total Protein ELP: 6.9 g/dL (ref 6.0–8.5)

## 2024-04-06 ENCOUNTER — Other Ambulatory Visit (HOSPITAL_COMMUNITY): Payer: Self-pay

## 2024-04-06 ENCOUNTER — Encounter: Payer: Self-pay | Admitting: Hematology

## 2024-04-06 ENCOUNTER — Inpatient Hospital Stay: Attending: Hematology

## 2024-04-06 ENCOUNTER — Inpatient Hospital Stay (HOSPITAL_BASED_OUTPATIENT_CLINIC_OR_DEPARTMENT_OTHER): Admitting: Physician Assistant

## 2024-04-06 ENCOUNTER — Inpatient Hospital Stay

## 2024-04-06 VITALS — BP 120/71 | HR 80 | Temp 97.9°F | Resp 18 | Ht 61.0 in | Wt 281.6 lb

## 2024-04-06 DIAGNOSIS — Z5111 Encounter for antineoplastic chemotherapy: Secondary | ICD-10-CM | POA: Diagnosis present

## 2024-04-06 DIAGNOSIS — D649 Anemia, unspecified: Secondary | ICD-10-CM | POA: Diagnosis not present

## 2024-04-06 DIAGNOSIS — Z5112 Encounter for antineoplastic immunotherapy: Secondary | ICD-10-CM | POA: Diagnosis not present

## 2024-04-06 DIAGNOSIS — C9000 Multiple myeloma not having achieved remission: Secondary | ICD-10-CM

## 2024-04-06 DIAGNOSIS — Z87891 Personal history of nicotine dependence: Secondary | ICD-10-CM | POA: Insufficient documentation

## 2024-04-06 DIAGNOSIS — E860 Dehydration: Secondary | ICD-10-CM | POA: Insufficient documentation

## 2024-04-06 DIAGNOSIS — Z79899 Other long term (current) drug therapy: Secondary | ICD-10-CM | POA: Diagnosis not present

## 2024-04-06 LAB — CBC WITH DIFFERENTIAL (CANCER CENTER ONLY)
Abs Immature Granulocytes: 0.01 10*3/uL (ref 0.00–0.07)
Basophils Absolute: 0 10*3/uL (ref 0.0–0.1)
Basophils Relative: 0 %
Eosinophils Absolute: 0.1 10*3/uL (ref 0.0–0.5)
Eosinophils Relative: 1 %
HCT: 32.8 % — ABNORMAL LOW (ref 36.0–46.0)
Hemoglobin: 10.5 g/dL — ABNORMAL LOW (ref 12.0–15.0)
Immature Granulocytes: 0 %
Lymphocytes Relative: 25 %
Lymphs Abs: 1.4 10*3/uL (ref 0.7–4.0)
MCH: 27.1 pg (ref 26.0–34.0)
MCHC: 32 g/dL (ref 30.0–36.0)
MCV: 84.5 fL (ref 80.0–100.0)
Monocytes Absolute: 0.4 10*3/uL (ref 0.1–1.0)
Monocytes Relative: 7 %
Neutro Abs: 3.7 10*3/uL (ref 1.7–7.7)
Neutrophils Relative %: 67 %
Platelet Count: 269 10*3/uL (ref 150–400)
RBC: 3.88 MIL/uL (ref 3.87–5.11)
RDW: 17.6 % — ABNORMAL HIGH (ref 11.5–15.5)
WBC Count: 5.5 10*3/uL (ref 4.0–10.5)
nRBC: 0 % (ref 0.0–0.2)

## 2024-04-06 LAB — CMP (CANCER CENTER ONLY)
ALT: 11 U/L (ref 0–44)
AST: 8 U/L — ABNORMAL LOW (ref 15–41)
Albumin: 3.8 g/dL (ref 3.5–5.0)
Alkaline Phosphatase: 83 U/L (ref 38–126)
Anion gap: 6 (ref 5–15)
BUN: 11 mg/dL (ref 6–20)
CO2: 32 mmol/L (ref 22–32)
Calcium: 8.6 mg/dL — ABNORMAL LOW (ref 8.9–10.3)
Chloride: 106 mmol/L (ref 98–111)
Creatinine: 0.5 mg/dL (ref 0.44–1.00)
GFR, Estimated: >60 mL/min (ref >60)
Glucose, Bld: 78 mg/dL (ref 70–99)
Potassium: 3.4 mmol/L — ABNORMAL LOW (ref 3.5–5.1)
Sodium: 144 mmol/L (ref 135–145)
Total Bilirubin: 0.5 mg/dL (ref 0.0–1.2)
Total Protein: 6.8 g/dL (ref 6.5–8.1)

## 2024-04-06 MED ORDER — DIPHENHYDRAMINE HCL 25 MG PO CAPS
50.0000 mg | ORAL_CAPSULE | Freq: Once | ORAL | Status: AC
  Administered 2024-04-06: 50 mg via ORAL
  Filled 2024-04-06: qty 2

## 2024-04-06 MED ORDER — DEXAMETHASONE 4 MG PO TABS
20.0000 mg | ORAL_TABLET | Freq: Once | ORAL | Status: AC
  Administered 2024-04-06: 20 mg via ORAL
  Filled 2024-04-06: qty 5

## 2024-04-06 MED ORDER — BORTEZOMIB CHEMO SQ INJECTION 3.5 MG (2.5MG/ML)
1.5000 mg/m2 | Freq: Once | INTRAMUSCULAR | Status: AC
  Administered 2024-04-06: 3.75 mg via SUBCUTANEOUS
  Filled 2024-04-06: qty 1.5

## 2024-04-06 MED ORDER — FAMOTIDINE 20 MG PO TABS
20.0000 mg | ORAL_TABLET | Freq: Once | ORAL | Status: AC
  Administered 2024-04-06: 20 mg via ORAL
  Filled 2024-04-06: qty 1

## 2024-04-06 MED ORDER — ACETAMINOPHEN 325 MG PO TABS
650.0000 mg | ORAL_TABLET | Freq: Once | ORAL | Status: AC
  Administered 2024-04-06: 650 mg via ORAL
  Filled 2024-04-06: qty 2

## 2024-04-06 MED ORDER — DARATUMUMAB-HYALURONIDASE-FIHJ 1800-30000 MG-UT/15ML ~~LOC~~ SOLN
1800.0000 mg | Freq: Once | SUBCUTANEOUS | Status: AC
  Administered 2024-04-06: 1800 mg via SUBCUTANEOUS
  Filled 2024-04-06: qty 15

## 2024-04-06 MED ORDER — ZOLEDRONIC ACID 4 MG/100ML IV SOLN
4.0000 mg | Freq: Once | INTRAVENOUS | Status: DC
Start: 2024-04-06 — End: 2024-04-06

## 2024-04-06 NOTE — Progress Notes (Signed)
 HEMATOLOGY/ONCOLOGY CLINIC NOTE  Date of Service: 04/06/24  Patient Care Team: Arn Lane, MD as PCP - General (Family Medicine) Lisabeth Rider, MD as Consulting Physician (Neurology) Dorothe Gaster, RD as Dietitian (Family Medicine) Frankie Israel, MD as Consulting Physician (Hematology)  CHIEF COMPLAINTS/PURPOSE OF CONSULTATION:  F/u for evaluation and mx of Multiple myeloma  INTERVAL HISTORY:  Carla Hanson is a 59 y.o. female here for evaluation and management of multiple myeloma.   [name] reports she is tolerating treatment without any new or concerning symptoms. Her energy and appetite are overall stable. She denies nausea, vomiting or bowel habit changes. She has occasional shortness of breath with exertion that is chronic in nature. She reports improvement of bilateral lower extremity edema with elevation. She denies fevers, chills, sweats, chest pain, cough, headaches, dizziness. She has no other complaints.   MEDICAL HISTORY:  Past Medical History:  Diagnosis Date   Alcohol abuse    Anxiety    Back pain    Bipolar disorder (HCC)    Bronchitis    COPD exacerbation (HCC) 05/10/2016   Drug use    Edema 02/24/2024   History of blood clots    Hyperlipidemia    Hypertension    Influenza A 12/01/2018   Joint pain    Left leg DVT (HCC) 09/29/2013   Lipoma    Abdomen   LIPOMA 01/20/2008   Obesity    Occasional tremors 05/12/2019   Osteoarthritis of right knee    Other fatigue    Painful menstrual periods 01/05/2012   Pneumonia    Prediabetes    Seizure (HCC)    Shortness of breath on exertion    Stroke (HCC)    Tobacco abuse     SURGICAL HISTORY: Past Surgical History:  Procedure Laterality Date   CESAREAN SECTION     CYSTECTOMY     LIPOMA EXCISION  03/2011   ORIF ANKLE FRACTURE  03/13/2012   Procedure: OPEN REDUCTION INTERNAL FIXATION (ORIF) ANKLE FRACTURE;  Surgeon: Alphonso Jean, MD;  Location: [facility];  Service: Orthopedics;   Laterality: Right;   TUBAL LIGATION      SOCIAL HISTORY: Social History   Socioeconomic History   Marital status: Divorced    Spouse name: Not on file   Number of children: 4   Years of education: 10   Highest education level: Not on file  Occupational History   Occupation: stay at home  Tobacco Use   Smoking status: Former    Current packs/day: 0.00    Average packs/day: 0.5 packs/day for 35.0 years (17.5 ttl pk-yrs)    Types: Cigarettes    Start date: 11/04/1981    Quit date: 10/2016    Years since quitting: 7.5   Smokeless tobacco: Never  Vaping Use   Vaping status: Never Used  Substance and Sexual Activity   Alcohol use: No    Alcohol/week: 0.0 standard drinks of alcohol    Comment: Quit 10/2016   Drug use: Yes    Types: Marijuana   Sexual activity: Not Currently    Comment: tubal  Other Topics Concern   Not on file  Social History Narrative   Unemployed, 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  Considering nursing.    Lives at home alone   Right-handed   Caffeine: not much   Social Drivers of Corporate investment banker Strain: Low Risk  (  11/25/2023)   Overall Financial Resource Strain (CARDIA)    Difficulty of Paying Living Expenses: Not hard at all  Food Insecurity: No Food Insecurity (02/06/2024)   Hunger Vital Sign    Worried About Running Out of Food in the Last Year: Never true    Ran Out of Food in the Last Year: Never true  Transportation Needs: No Transportation Needs (02/06/2024)   PRAPARE - Administrator, Civil Service (Medical): No    Lack of Transportation (Non-Medical): No  Physical Activity: Sufficiently Active (11/25/2023)   Exercise Vital Sign    Days of Exercise per Week: 5 days    Minutes of Exercise per Session: 60 min  Stress: No Stress Concern Present (11/25/2023)   [employer] of Occupational Health - Occupational Stress Questionnaire     Feeling of Stress : Not at all  Social Connections: Moderately Integrated (01/31/2024)   Social Connection and Isolation Panel [NHANES]    Frequency of Communication with Friends and Family: Three times a week    Frequency of Social Gatherings with Friends and Family: Once a week    Attends Religious Services: More than 4 times per year    Active Member of [employer] or Organizations: Yes    Attends Banker Meetings: Patient declined    Marital Status: Divorced  Catering manager Violence: Not At Risk (02/06/2024)   Humiliation, Afraid, Rape, and Kick questionnaire    Fear of Current or Ex-Partner: No    Emotionally Abused: No    Physically Abused: No    Sexually Abused: No    FAMILY HISTORY: Family History  Problem Relation Age of Onset   Other Mother        High blood pressure runs in the family   Obesity Mother    Asthma Father    Heart attack Father    Obesity Father    Diabetes Maternal Aunt    Breast cancer Neg Hx     ALLERGIES:  is allergic to latuda  [lurasidone ].  MEDICATIONS:  Current Outpatient Medications  Medication Sig Dispense Refill   acetaminophen  (TYLENOL ) 325 MG tablet Take 650 mg by mouth every 6 (six) hours as needed for mild pain (pain score 1-3).     acyclovir  (ZOVIRAX ) 400 MG tablet Take 1 tablet (400 mg total) by mouth 2 (two) times daily. 60 tablet 5   acyclovir  (ZOVIRAX ) 400 MG tablet Take 1 tablet (400 mg total) by mouth 2 (two) times daily. 60 tablet 5   albuterol  (PROVENTIL ) (2.5 MG/3ML) 0.083% nebulizer solution Take 3 mLs (2.5 mg total) by nebulization every 6 (six) hours as needed for wheezing or shortness of breath. 75 mL 2   albuterol  (PROVENTIL ) (2.5 MG/3ML) 0.083% nebulizer solution Inhale 3 mLs by nebulization every 6 (six) hours as needed for wheezing / shortness of breath 75 mL 2   albuterol  (VENTOLIN  HFA) 108 (90 Base) MCG/ACT inhaler Inhale 2 puffs into the lungs every 6 (six) hours as needed for wheezing or shortness of breath. 18  g 4   aspirin  EC 81 MG EC tablet Take 1 tablet (81 mg total) by mouth daily. 30 tablet 12   cetirizine  (ZYRTEC ) 10 MG tablet Take 1 tablet (10 mg total) by mouth daily. 90 tablet 1   dexamethasone  (DECADRON ) 4 MG tablet Take 5 tabs (20 mg) weekly the day after daratumumab  for 12 weeks. Take with breakfast. 20 tablet 5   dexamethasone  (DECADRON ) 4 MG tablet Take 5 tablets (20 mg total) by mouth  once weekly, the day after daratumumab  for 12 weeks , take with breakfast 20 tablet 5   ferrous sulfate  325 (65 FE) MG tablet Take 325 mg by mouth daily with breakfast.     furosemide  (LASIX ) 40 MG tablet Take 1 tablet (40 mg total) by mouth daily. 30 tablet 1   hydrALAZINE  (APRESOLINE ) 10 MG tablet Take 1 tablet (10 mg total) by mouth 3 (three) times daily. 90 tablet 1   losartan  (COZAAR ) 50 MG tablet Take 1 tablet (50 mg total) by mouth daily. 30 tablet 1   metFORMIN  (GLUCOPHAGE ) 500 MG tablet Take 1 tablet (500 mg total) by mouth daily with breakfast.     metFORMIN  (GLUCOPHAGE ) 500 MG tablet Take 1 tablet (500 mg total) by mouth 3 (three) times daily with meals 270 tablet 1   ondansetron  (ZOFRAN ) 8 MG tablet Take 1 tablet (8 mg total) by mouth every 8 (eight) hours as needed for nausea or vomiting. 30 tablet 1   ondansetron  (ZOFRAN ) 8 MG tablet Take 1 tablet (8 mg total) by mouth every 8 (eight) hours as needed. 30 tablet 1   potassium chloride  SA (KLOR-CON  M) 20 MEQ tablet Take 1 tablet (20 mEq total) by mouth daily. 30 tablet 1   prochlorperazine  (COMPAZINE ) 10 MG tablet Take 1 tablet (10 mg total) by mouth every 6 (six) hours as needed for nausea or vomiting. 30 tablet 1   prochlorperazine  (COMPAZINE ) 10 MG tablet Take 1 tablet (10 mg total) by mouth every 6 (six) hours as needed for nausea or vomiting 30 tablet 1   rosuvastatin  (CRESTOR ) 20 MG tablet TAKE 1 TABLET(20 MG) BY MOUTH DAILY 90 tablet 1   Semaglutide -Weight Management (WEGOVY ) 0.5 MG/0.5ML SOAJ Inject 0.5 mg into the skin once a week. 2 mL 0    tiZANidine  (ZANAFLEX ) 4 MG tablet Take 1 tablet (4 mg total) by mouth 2 (two) times daily as needed for muscle spasms. 60 tablet 1   calcitonin, salmon, (MIACALCIN /FORTICAL) 200 UNIT/ACT nasal spray Place 1 spray into alternate nostrils daily , start vitamin D  and calcium  supplement with this medication 3.7 mL 1   hydrochlorothiazide  (HYDRODIURIL ) 25 MG tablet Take 1 tablet (25 mg total) by mouth daily. 30 tablet 1   hydrochlorothiazide  (HYDRODIURIL ) 25 MG tablet Take 1 tablet (25 mg total) by mouth daily. 30 tablet 1   lidocaine  (LIDODERM ) 5 % Place 1 patch onto the skin and remove after 12 hours or as directed 20 patch 2   No current facility-administered medications for this visit.    REVIEW OF SYSTEMS:    10 Point review of Systems was done is negative except as noted above.   PHYSICAL EXAMINATION: ECOG PERFORMANCE STATUS: 2  . Vitals:   04/06/24 0834  BP: 120/71  Pulse: 80  Resp: 18  Temp: 97.9 F (36.6 C)  SpO2: 98%   Filed Weights   04/06/24 0834  Weight: 281 lb 9.6 oz (127.7 kg)  .Body mass index is 53.21 kg/m.   GENERAL:alert, in no acute distress and comfortable SKIN: no acute rashes, no significant lesions EYES: conjunctiva are pink and non-injected, sclera anicteric LUNGS: clear to auscultation b/l with normal respiratory effort HEART: regular rate & rhythm Extremity: no pedal edema PSYCH: alert & oriented x 3 with fluent speech NEURO: no focal motor/sensory deficits   LABORATORY DATA:  I have reviewed the data as listed  .    Latest Ref Rng & Units 04/06/2024    7:53 AM 04/01/2024   12:59 PM  03/24/2024   11:43 AM  CBC  WBC 4.0 - 10.5 K/uL 5.5  6.2  6.1   Hemoglobin 12.0 - 15.0 g/dL 16.1  09.6  04.5   Hematocrit 36.0 - 46.0 % 32.8  32.4  32.4   Platelets 150 - 400 K/uL 269  246  265     .    Latest Ref Rng & Units 04/06/2024    7:53 AM 04/01/2024   12:59 PM 03/24/2024   11:43 AM  CMP  Glucose 70 - 99 mg/dL 78  409  811   BUN 6 - 20 mg/dL 11  19   14    Creatinine 0.44 - 1.00 mg/dL 9.14  7.82  9.56   Sodium 135 - 145 mmol/L 144  142  141   Potassium 3.5 - 5.1 mmol/L 3.4  3.7  3.6   Chloride 98 - 111 mmol/L 106  104  106   CO2 22 - 32 mmol/L 32  32  30   Calcium  8.9 - 10.3 mg/dL 8.6  9.1  9.0   Total Protein 6.5 - 8.1 g/dL 6.8  7.4  7.7   Total Bilirubin 0.0 - 1.2 mg/dL 0.5  0.4  0.4   Alkaline Phos 38 - 126 U/L 83  85  61   AST 15 - 41 U/L 8  9  10    ALT 0 - 44 U/L 11  9  11     02/05/2024 Bone Marrow Biopsy:       RADIOGRAPHIC STUDIES: I have personally reviewed the radiological images as listed and agreed with the findings in the report. No results found.   ASSESSMENT & PLAN:  Carla Hanson is a 58 y.o. female who presents to the clinic for continued management of multiple myeloma.   #Multiple Myeloma --Bone marrow biopsy on 02/05/2024 showed Hypercellular bone marrow (60%) involved by plasma cell neoplasm (9 % plasma cells by manual aspirate differential, approximately 10% by CD138 immunohistochemical analysis, and lambda restricted by kappa/lambda in situ hybridization  --PET scan from 02/12/2024 showed extensive lytic lesions throughout the skeleton.  --Started Cycle 1, Day 1 of Daratumumab /Dex on 03/12/2024 --Started Zometa  on 03/12/2024 q 28 days   #History of T6 compression fracture: --Likely related to multiple myeloma.  Mild pain at this time.  PLAN: --Due for cycle 2, Day 8 of Dara/Dex --Labs from today were reviewed and required no intervention. WBC 5.5, Hgb 10.5, Plt 269, creatinine and LFTs in range.  --Proceed with treatment without any dose modification.  --RTC in one week with labs and follow up prior to Cycle 2, Day 15.    All of the patient's questions were answered with apparent satisfaction. The patient knows to call the clinic with any problems, questions or concerns.   I have spent a total of 30 minutes minutes of face-to-face and non-face-to-face time, preparing to see the patient, performing a  medically appropriate examination, counseling and educating the patient, documenting clinical information in the electronic health record, independently interpreting results and communicating results to the patient, and care coordination.   Wyline Hearing PA-C Dept of Hematology and Oncology [facility] Phone: [phone]

## 2024-04-06 NOTE — Patient Instructions (Signed)
 [facility] - A DEPT OF [facility]  Discharge Instructions: Thank you for choosing [facility] to provide your oncology and hematology care.   If you have a lab appointment with the Cancer Center, please go directly to the Cancer Center and check in at the registration area.   Wear comfortable clothing and clothing appropriate for easy access to any Portacath or PICC line.   We strive to give you quality time with your provider. You may need to reschedule your appointment if you arrive late (15 or more minutes).  Arriving late affects you and other patients whose appointments are after yours.  Also, if you miss three or more appointments without notifying the office, you may be dismissed from the clinic at the provider's discretion.      For prescription refill requests, have your pharmacy contact our office and allow 72 hours for refills to be completed.    Today you received the following chemotherapy and/or immunotherapy agents: bortezomib  and daratumumab       To help prevent nausea and vomiting after your treatment, we encourage you to take your nausea medication as directed.  BELOW ARE SYMPTOMS THAT SHOULD BE REPORTED IMMEDIATELY: *FEVER GREATER THAN 100.4 F (38 C) OR HIGHER *CHILLS OR SWEATING *NAUSEA AND VOMITING THAT IS NOT CONTROLLED WITH YOUR NAUSEA MEDICATION *UNUSUAL SHORTNESS OF BREATH *UNUSUAL BRUISING OR BLEEDING *URINARY PROBLEMS (pain or burning when urinating, or frequent urination) *BOWEL PROBLEMS (unusual diarrhea, constipation, pain near the anus) TENDERNESS IN MOUTH AND THROAT WITH OR WITHOUT PRESENCE OF ULCERS (sore throat, sores in mouth, or a toothache) UNUSUAL RASH, SWELLING OR PAIN  UNUSUAL VAGINAL DISCHARGE OR ITCHING   Items with * indicate a potential emergency and should be followed up as soon as possible or go to the Emergency Department if any problems should occur.  Please show the CHEMOTHERAPY ALERT CARD  or IMMUNOTHERAPY ALERT CARD at check-in to the Emergency Department and triage nurse.  Should you have questions after your visit or need to cancel or reschedule your appointment, please contact [facility] - A DEPT OF [facility]  Dept: [phone]  and follow the prompts.  Office hours are 8:00 a.m. to 4:30 p.m. Monday - Friday. Please note that voicemails left after 4:00 p.m. may not be returned until the following business day.  We are closed weekends and major holidays. You have access to a nurse at all times for urgent questions. Please call the main number to the clinic Dept: [phone] and follow the prompts.   For any non-urgent questions, you may also contact your provider using MyChart. We now offer e-Visits for anyone 7 and older to request care online for non-urgent symptoms. For details visit [email or web address].   Also download the MyChart app! Go to the app store, search "MyChart", open the app, select [facility], and log in with your MyChart username and password.

## 2024-04-07 ENCOUNTER — Telehealth: Payer: Self-pay

## 2024-04-07 ENCOUNTER — Other Ambulatory Visit (HOSPITAL_COMMUNITY): Payer: Self-pay

## 2024-04-07 DIAGNOSIS — M1711 Unilateral primary osteoarthritis, right knee: Secondary | ICD-10-CM | POA: Diagnosis not present

## 2024-04-07 DIAGNOSIS — F319 Bipolar disorder, unspecified: Secondary | ICD-10-CM | POA: Diagnosis not present

## 2024-04-07 DIAGNOSIS — C9000 Multiple myeloma not having achieved remission: Secondary | ICD-10-CM | POA: Diagnosis not present

## 2024-04-07 DIAGNOSIS — E860 Dehydration: Secondary | ICD-10-CM | POA: Diagnosis not present

## 2024-04-07 DIAGNOSIS — S22000D Wedge compression fracture of unspecified thoracic vertebra, subsequent encounter for fracture with routine healing: Secondary | ICD-10-CM | POA: Diagnosis not present

## 2024-04-07 DIAGNOSIS — Z86711 Personal history of pulmonary embolism: Secondary | ICD-10-CM | POA: Diagnosis not present

## 2024-04-07 DIAGNOSIS — F419 Anxiety disorder, unspecified: Secondary | ICD-10-CM | POA: Diagnosis not present

## 2024-04-07 DIAGNOSIS — D509 Iron deficiency anemia, unspecified: Secondary | ICD-10-CM | POA: Diagnosis not present

## 2024-04-07 DIAGNOSIS — Z7984 Long term (current) use of oral hypoglycemic drugs: Secondary | ICD-10-CM | POA: Diagnosis not present

## 2024-04-07 DIAGNOSIS — I10 Essential (primary) hypertension: Secondary | ICD-10-CM | POA: Diagnosis not present

## 2024-04-07 DIAGNOSIS — M25512 Pain in left shoulder: Secondary | ICD-10-CM | POA: Diagnosis not present

## 2024-04-07 DIAGNOSIS — Z8673 Personal history of transient ischemic attack (TIA), and cerebral infarction without residual deficits: Secondary | ICD-10-CM | POA: Diagnosis not present

## 2024-04-07 DIAGNOSIS — Z6841 Body Mass Index (BMI) 40.0 and over, adult: Secondary | ICD-10-CM | POA: Diagnosis not present

## 2024-04-07 DIAGNOSIS — G8929 Other chronic pain: Secondary | ICD-10-CM | POA: Diagnosis not present

## 2024-04-07 DIAGNOSIS — S72142D Displaced intertrochanteric fracture of left femur, subsequent encounter for closed fracture with routine healing: Secondary | ICD-10-CM | POA: Diagnosis not present

## 2024-04-07 DIAGNOSIS — Z86718 Personal history of other venous thrombosis and embolism: Secondary | ICD-10-CM | POA: Diagnosis not present

## 2024-04-07 DIAGNOSIS — E876 Hypokalemia: Secondary | ICD-10-CM | POA: Diagnosis not present

## 2024-04-07 DIAGNOSIS — M25511 Pain in right shoulder: Secondary | ICD-10-CM | POA: Diagnosis not present

## 2024-04-07 DIAGNOSIS — J449 Chronic obstructive pulmonary disease, unspecified: Secondary | ICD-10-CM | POA: Diagnosis not present

## 2024-04-07 DIAGNOSIS — E785 Hyperlipidemia, unspecified: Secondary | ICD-10-CM | POA: Diagnosis not present

## 2024-04-07 DIAGNOSIS — R7303 Prediabetes: Secondary | ICD-10-CM | POA: Diagnosis not present

## 2024-04-07 DIAGNOSIS — Z7982 Long term (current) use of aspirin: Secondary | ICD-10-CM | POA: Diagnosis not present

## 2024-04-07 NOTE — Telephone Encounter (Signed)
 Marita Sidle [facility] PT with [facility] calls nurse line requesting VO for [facility] as follows.  1x a week for 8 weeks.   VO given per [facility] protocol.

## 2024-04-12 ENCOUNTER — Encounter: Payer: Self-pay | Admitting: Family Medicine

## 2024-04-12 DIAGNOSIS — Z8781 Personal history of (healed) traumatic fracture: Secondary | ICD-10-CM | POA: Insufficient documentation

## 2024-04-13 ENCOUNTER — Encounter: Payer: Self-pay | Admitting: Family Medicine

## 2024-04-13 ENCOUNTER — Ambulatory Visit (INDEPENDENT_AMBULATORY_CARE_PROVIDER_SITE_OTHER): Admitting: Family Medicine

## 2024-04-13 ENCOUNTER — Other Ambulatory Visit (HOSPITAL_COMMUNITY): Payer: Self-pay

## 2024-04-13 VITALS — BP 114/58 | HR 77 | Ht 61.0 in | Wt 281.1 lb

## 2024-04-13 DIAGNOSIS — F419 Anxiety disorder, unspecified: Secondary | ICD-10-CM | POA: Diagnosis not present

## 2024-04-13 DIAGNOSIS — E785 Hyperlipidemia, unspecified: Secondary | ICD-10-CM | POA: Diagnosis not present

## 2024-04-13 DIAGNOSIS — Z86718 Personal history of other venous thrombosis and embolism: Secondary | ICD-10-CM | POA: Diagnosis not present

## 2024-04-13 DIAGNOSIS — I10 Essential (primary) hypertension: Secondary | ICD-10-CM

## 2024-04-13 DIAGNOSIS — E876 Hypokalemia: Secondary | ICD-10-CM | POA: Diagnosis not present

## 2024-04-13 DIAGNOSIS — Z7984 Long term (current) use of oral hypoglycemic drugs: Secondary | ICD-10-CM | POA: Diagnosis not present

## 2024-04-13 DIAGNOSIS — F319 Bipolar disorder, unspecified: Secondary | ICD-10-CM | POA: Diagnosis not present

## 2024-04-13 DIAGNOSIS — Z7982 Long term (current) use of aspirin: Secondary | ICD-10-CM | POA: Diagnosis not present

## 2024-04-13 DIAGNOSIS — Z8673 Personal history of transient ischemic attack (TIA), and cerebral infarction without residual deficits: Secondary | ICD-10-CM | POA: Diagnosis not present

## 2024-04-13 DIAGNOSIS — M1711 Unilateral primary osteoarthritis, right knee: Secondary | ICD-10-CM | POA: Diagnosis not present

## 2024-04-13 DIAGNOSIS — C9000 Multiple myeloma not having achieved remission: Secondary | ICD-10-CM | POA: Diagnosis not present

## 2024-04-13 DIAGNOSIS — D509 Iron deficiency anemia, unspecified: Secondary | ICD-10-CM | POA: Diagnosis not present

## 2024-04-13 DIAGNOSIS — J449 Chronic obstructive pulmonary disease, unspecified: Secondary | ICD-10-CM | POA: Diagnosis not present

## 2024-04-13 DIAGNOSIS — Z6841 Body Mass Index (BMI) 40.0 and over, adult: Secondary | ICD-10-CM | POA: Diagnosis not present

## 2024-04-13 DIAGNOSIS — Z86711 Personal history of pulmonary embolism: Secondary | ICD-10-CM | POA: Diagnosis not present

## 2024-04-13 DIAGNOSIS — R600 Localized edema: Secondary | ICD-10-CM | POA: Diagnosis not present

## 2024-04-13 DIAGNOSIS — E860 Dehydration: Secondary | ICD-10-CM | POA: Diagnosis not present

## 2024-04-13 DIAGNOSIS — R7303 Prediabetes: Secondary | ICD-10-CM | POA: Diagnosis not present

## 2024-04-13 DIAGNOSIS — S72142D Displaced intertrochanteric fracture of left femur, subsequent encounter for closed fracture with routine healing: Secondary | ICD-10-CM | POA: Diagnosis not present

## 2024-04-13 DIAGNOSIS — M25512 Pain in left shoulder: Secondary | ICD-10-CM | POA: Diagnosis not present

## 2024-04-13 DIAGNOSIS — G8929 Other chronic pain: Secondary | ICD-10-CM | POA: Diagnosis not present

## 2024-04-13 DIAGNOSIS — M25511 Pain in right shoulder: Secondary | ICD-10-CM | POA: Diagnosis not present

## 2024-04-13 DIAGNOSIS — S22000D Wedge compression fracture of unspecified thoracic vertebra, subsequent encounter for fracture with routine healing: Secondary | ICD-10-CM | POA: Diagnosis not present

## 2024-04-13 MED ORDER — POTASSIUM CHLORIDE CRYS ER 20 MEQ PO TBCR
20.0000 meq | EXTENDED_RELEASE_TABLET | Freq: Every day | ORAL | 2 refills | Status: DC
  Filled 2024-04-13 – 2024-05-06 (×2): qty 30, 30d supply, fill #0
  Filled 2024-05-26 – 2024-06-09 (×2): qty 30, 30d supply, fill #1
  Filled 2024-07-05: qty 30, 30d supply, fill #2

## 2024-04-13 NOTE — Assessment & Plan Note (Addendum)
 D/C Hydralazine  altogether Continue current dose of Losartan  50 mg every day and Lasix  40 mg every day Monitor BP closely

## 2024-04-13 NOTE — Assessment & Plan Note (Addendum)
 Will no change her Lasix  dose given her soft BP Perhaps reduce Losartan  dose in the future to accommodate higher dose of Lasix  if needed Reduce salt intake, keep leg elevated and wear compression stockings recommended Monitor closely for now

## 2024-04-13 NOTE — Assessment & Plan Note (Addendum)
 I reviewed lab from oncology K+ mildly low Continue supplement KDur refilled Continue lab check with oncology

## 2024-04-13 NOTE — Patient Instructions (Signed)
 Edema  Edema is when you have too much fluid in your body or under your skin. Edema may make your legs, feet, and ankles swell. Swelling often happens in looser tissues, such as around your eyes. This is a common condition. It gets more common as you get older. There are many possible causes of edema. These include: Eating too much salt (sodium). Being on your feet or sitting for a long time. Certain medical conditions, such as: Pregnancy. Heart failure. Liver disease. Kidney disease. Cancer. Hot weather may make edema worse. Edema is usually painless. Your skin may look swollen or shiny. Follow these instructions at home: Medicines Take over-the-counter and prescription medicines only as told by your doctor. Your doctor may prescribe a medicine to help your body get rid of extra water (diuretic). Take this medicine if you are told to take it. Eating and drinking Eat a low-salt (low-sodium) diet as told by your doctor. Sometimes, eating less salt may reduce swelling. Depending on the cause of your swelling, you may need to limit how much fluid you drink (fluid restriction). General instructions Raise the injured area above the level of your heart while you are sitting or lying down. Do not sit still or stand for a long time. Do not wear tight clothes. Do not wear garters on your upper legs. Exercise your legs. This can help the swelling go down. Wear compression stockings as told by your doctor. It is important that these are the right size. These should be prescribed by your doctor to prevent possible injuries. If elastic bandages or wraps are recommended, use them as told by your doctor. Contact a doctor if: Treatment is not working. You have heart, liver, or kidney disease and have symptoms of edema. You have sudden and unexplained weight gain. Get help right away if: You have shortness of breath or chest pain. You cannot breathe when you lie down. You have pain, redness, or  warmth in the swollen areas. You have heart, liver, or kidney disease and get edema all of a sudden. You have a fever and your symptoms get worse all of a sudden. These symptoms may be an emergency. Get help right away. Call 911. Do not wait to see if the symptoms will go away. Do not drive yourself to the hospital. Summary Edema is when you have too much fluid in your body or under your skin. Edema may make your legs, feet, and ankles swell. Swelling often happens in looser tissues, such as around your eyes. Raise the injured area above the level of your heart while you are sitting or lying down. Follow your doctor's instructions about diet and how much fluid you can drink. This information is not intended to replace advice given to you by your health care provider. Make sure you discuss any questions you have with your health care provider. Document Revised: 06/25/2021 Document Reviewed: 06/25/2021 Elsevier Patient Education  2024 [employer].

## 2024-04-13 NOTE — Progress Notes (Signed)
 HEMATOLOGY/ONCOLOGY CLINIC NOTE  Date of Service: 04/14/2024  Patient Care Team: Arn Lane, MD as PCP - General (Family Medicine) Lisabeth Rider, MD as Consulting Physician (Neurology) Dorothe Gaster, RD as Dietitian (Family Medicine) Frankie Israel, MD as Consulting Physician (Hematology)  CHIEF COMPLAINTS/PURPOSE OF CONSULTATION:  F/u for evaluation and mx of Multiple myeloma  HISTORY OF PRESENTING ILLNESS:  Carla Hanson is a wonderful 58 y.o. 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   Patient has a history of hypertension, dyslipidemia, COPD, history of PE/DVT, prediabetes, obesity who notes that she was working out in the gym and had significant back pain resulting in an x-ray that showed T6 compression fracture in January 2025.   She had a follow-up with healthy weight and wellness clinic on 3/27 and reported symptoms of significant weakness dizziness lightheadedness and dehydration despite drinking a lot of water.  She also noted nausea but no emesis and constipation. Patient was seen in the emergency room and noted to have severe hypercalcemia with a calcium  level of 17.8 with an albumin of 3.8, total protein was elevated at 9.1 with an increased globulin gap, potassium was 2.7. 25-hydroxy vitamin D  levels were within normal limits at 66.9. TSH was normal at 0.736. PTH levels were appropriately suppressed at 6 which is suggestive of non hyperparathyroid hypercalcemia. CBC-on admission showed normal hemoglobin of 13.1 in the context of significant hemoconcentration.  This might be hiding her baseline anemia which might declare itself once the patient's volume status is replaced.  No other cytopenias. Patient notes no acute new focal bone pains.   Patient notes no fevers no chills no night sweats. No previous history of cancers. Her last mammogram was on 02/07/2023 and was within  normal limits.  She is previously noted to have a benign lipoma in the right breast which was resected. Patient has been ex-smoker and smoked half to 1 pack a day for 35 years and quit in December 2017.   New focal symptoms. Patient has received 1 dose of Zometa  and IV fluids and calcium  levels today are down to 12 with a corrected calcium  between 13 and 14. She is receiving IV fluids potassium replacement phosphorus  replacement and calcitonin today.   Normal labs have been ordered and patient is pending whole-body skeletal survey.  24-hour urine collection for myeloma has been sent out.   INTERVAL HISTORY:  Carla Hanson is a 58 y.o. female here for hospital follow-up for evaluation and management of multiple myeloma.   She was last seen by me on 03/24/2024 and complained of stiffness/pain in her left side near the ribs and improved leg swelling.   Patient was seen by [facility] on  04/06/2024 and reported chronic occasional SOB with exertion and improved leg swelling.   Patient presents in a wheelchair and is accompanied by her daughter during today's visit.   She presents today for toxicity check prior to cycle 2 day 15 of treatment.   She reports that her back pain is manageable. Patient has no particualrly painful or tender spots in her back.   Her energy level has been normal and she notes that she has increased her physical activity.   Patient reports unusual rib cage sensation and notes mild SOB only on exertion, which improves once she starts walking. She denies any rib pain. She denies sense of anxiety.   Patient has been  eating well and has been staying well-hydrated.   She has been moving her bowels regularly with no constipation. Patient denies any abdominal pain, headaches, or new bone pain.   Patient reports that prior to her diagnosis, she would regularly lift weights, and she is inclined to weight lift again.   She denies any tingling or numbness in the hands or feet  that is new or different.   She is noted to have mild leg sweling at this time, which has improved.   Patient is currently taking 20 MG (5 tablets) dexamethasone  at this time, the day after daratumumab .   She has no other new symptoms.   MEDICAL HISTORY:  Past Medical History:  Diagnosis Date   Alcohol abuse    Anxiety    Back pain    Bipolar disorder (HCC)    Bronchitis    COPD exacerbation (HCC) 05/10/2016   Drug use    Edema 02/24/2024   History of blood clots    Hyperlipidemia    Hypertension    Influenza A 12/01/2018   Joint pain    Left leg DVT (HCC) 09/29/2013   Lipoma    Abdomen   LIPOMA 01/20/2008   Obesity    Occasional tremors 05/12/2019   Osteoarthritis of right knee    Other fatigue    Painful menstrual periods 01/05/2012   Pneumonia    Prediabetes    Seizure (HCC)    Shortness of breath on exertion    Stroke (HCC)    Tobacco abuse     SURGICAL HISTORY: Past Surgical History:  Procedure Laterality Date   CESAREAN SECTION     CYSTECTOMY     LIPOMA EXCISION  03/2011   ORIF ANKLE FRACTURE  03/13/2012   Procedure: OPEN REDUCTION INTERNAL FIXATION (ORIF) ANKLE FRACTURE;  Surgeon: Alphonso Jean, MD;  Location: [facility];  Service: Orthopedics;  Laterality: Right;   TUBAL LIGATION      SOCIAL HISTORY: Social History   Socioeconomic History   Marital status: Divorced    Spouse name: Not on file   Number of children: 4   Years of education: 10   Highest education level: Not on file  Occupational History   Occupation: stay at home  Tobacco Use   Smoking status: Former    Current packs/day: 0.00    Average packs/day: 0.5 packs/day for 35.0 years (17.5 ttl pk-yrs)    Types: Cigarettes    Start date: 11/04/1981    Quit date: 10/2016    Years since quitting: 7.5   Smokeless tobacco: Never  Vaping Use   Vaping status: Never Used  Substance and Sexual Activity   Alcohol use: No    Alcohol/week: 0.0 standard drinks of alcohol    Comment: Quit 10/2016    Drug use: Yes    Types: Marijuana   Sexual activity: Not Currently    Comment: tubal  Other Topics Concern   Not on file  Social History Narrative   Unemployed, 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  Considering nursing.    Lives at home alone   Right-handed   Caffeine: not much   Social Drivers of Corporate investment banker Strain: Low Risk  (11/25/2023)   Overall Financial Resource Strain (CARDIA)    Difficulty of Paying Living Expenses: Not hard at all  Food Insecurity: No Food Insecurity (02/06/2024)   Hunger Vital Sign    Worried About  Running Out of Food in the Last Year: Never true    Ran Out of Food in the Last Year: Never true  Transportation Needs: No Transportation Needs (02/06/2024)   PRAPARE - Administrator, Civil Service (Medical): No    Lack of Transportation (Non-Medical): No  Physical Activity: Sufficiently Active (11/25/2023)   Exercise Vital Sign    Days of Exercise per Week: 5 days    Minutes of Exercise per Session: 60 min  Stress: No Stress Concern Present (11/25/2023)   [employer] of Occupational Health - Occupational Stress Questionnaire    Feeling of Stress : Not at all  Social Connections: Moderately Integrated (01/31/2024)   Social Connection and Isolation Panel    Frequency of Communication with Friends and Family: Three times a week    Frequency of Social Gatherings with Friends and Family: Once a week    Attends Religious Services: More than 4 times per year    Active Member of [employer] or Organizations: Yes    Attends Banker Meetings: Patient declined    Marital Status: Divorced  Catering manager Violence: Not At Risk (02/06/2024)   Humiliation, Afraid, Rape, and Kick questionnaire    Fear of Current or Ex-Partner: No    Emotionally Abused: No    Physically Abused: No    Sexually Abused: No    FAMILY HISTORY: Family History  Problem  Relation Age of Onset   Other Mother        High blood pressure runs in the family   Obesity Mother    Asthma Father    Heart attack Father    Obesity Father    Diabetes Maternal Aunt    Breast cancer Neg Hx     ALLERGIES:  is allergic to latuda  [lurasidone ].  MEDICATIONS:  Current Outpatient Medications  Medication Sig Dispense Refill   acyclovir  (ZOVIRAX ) 400 MG tablet Take 1 tablet (400 mg total) by mouth 2 (two) times daily. 60 tablet 5   aspirin  EC 81 MG EC tablet Take 1 tablet (81 mg total) by mouth daily. 30 tablet 12   dexamethasone  (DECADRON ) 4 MG tablet Take 5 tabs (20 mg) weekly the day after daratumumab  for 12 weeks. Take with breakfast. 20 tablet 5   furosemide  (LASIX ) 40 MG tablet Take 1 tablet (40 mg total) by mouth daily. 30 tablet 1   losartan  (COZAAR ) 50 MG tablet Take 1 tablet (50 mg total) by mouth daily. 30 tablet 1   metFORMIN  (GLUCOPHAGE ) 500 MG tablet Take 1 tablet (500 mg total) by mouth daily with breakfast.     potassium chloride  SA (KLOR-CON  M) 20 MEQ tablet Take 1 tablet (20 mEq total) by mouth daily. 30 tablet 2   rosuvastatin  (CRESTOR ) 20 MG tablet TAKE 1 TABLET(20 MG) BY MOUTH DAILY 90 tablet 1   acetaminophen  (TYLENOL ) 325 MG tablet Take 650 mg by mouth every 6 (six) hours as needed for mild pain (pain score 1-3). (Patient not taking: Reported on 04/13/2024)     acyclovir  (ZOVIRAX ) 400 MG tablet Take 1 tablet (400 mg total) by mouth 2 (two) times daily. (Patient not taking: Reported on 04/14/2024) 60 tablet 5   albuterol  (PROVENTIL ) (2.5 MG/3ML) 0.083% nebulizer solution Take 3 mLs (2.5 mg total) by nebulization every 6 (six) hours as needed for wheezing or shortness of breath. (Patient not taking: Reported on 04/13/2024) 75 mL 2   albuterol  (VENTOLIN  HFA) 108 (90 Base) MCG/ACT inhaler Inhale 2 puffs into the lungs every 6 (  six) hours as needed for wheezing or shortness of breath. (Patient not taking: Reported on 04/13/2024) 18 g 4   cetirizine  (ZYRTEC ) 10 MG  tablet Take 1 tablet (10 mg total) by mouth daily. 90 tablet 1   ferrous sulfate  325 (65 FE) MG tablet Take 325 mg by mouth daily with breakfast.     ondansetron  (ZOFRAN ) 8 MG tablet Take 1 tablet (8 mg total) by mouth every 8 (eight) hours as needed for nausea or vomiting. (Patient not taking: Reported on 04/13/2024) 30 tablet 1   prochlorperazine  (COMPAZINE ) 10 MG tablet Take 1 tablet (10 mg total) by mouth every 6 (six) hours as needed for nausea or vomiting (Patient not taking: Reported on 04/13/2024) 30 tablet 1   Semaglutide -Weight Management (WEGOVY ) 0.5 MG/0.5ML SOAJ Inject 0.5 mg into the skin once a week. 2 mL 0   tiZANidine  (ZANAFLEX ) 4 MG tablet Take 1 tablet (4 mg total) by mouth 2 (two) times daily as needed for muscle spasms. (Patient not taking: Reported on 04/13/2024) 60 tablet 1   No current facility-administered medications for this visit.    REVIEW OF SYSTEMS:    10 Point review of Systems was done is negative except as noted above.   PHYSICAL EXAMINATION: ECOG PERFORMANCE STATUS: 2  . Vitals:   04/14/24 1218  BP: (!) 150/84  Pulse: 97  Resp: 20  Temp: 97.7 F (36.5 C)  SpO2: 97%    Filed Weights   04/14/24 1218  Weight: 281 lb 8 oz (127.7 kg)   .Body mass index is 53.19 kg/m.   GENERAL:alert, in no acute distress and comfortable SKIN: no acute rashes, no significant lesions EYES: conjunctiva are pink and non-injected, sclera anicteric OROPHARYNX: MMM, no exudates, no oropharyngeal erythema or ulceration NECK: supple, no JVD LYMPH:  no palpable lymphadenopathy in the cervical, axillary or inguinal regions LUNGS: clear to auscultation b/l with normal respiratory effort HEART: regular rate & rhythm ABDOMEN:  normoactive bowel sounds , non tender, not distended. Extremity: no pedal edema PSYCH: alert & oriented x 3 with fluent speech NEURO: no focal motor/sensory deficits   LABORATORY DATA:  I have reviewed the data as listed  .    Latest Ref Rng &  Units 04/14/2024   11:13 AM 04/06/2024    7:53 AM 04/01/2024   12:59 PM  CBC  WBC 4.0 - 10.5 K/uL 6.3  5.5  6.2   Hemoglobin 12.0 - 15.0 g/dL 16.1  09.6  04.5   Hematocrit 36.0 - 46.0 % 35.9  32.8  32.4   Platelets 150 - 400 K/uL 292  269  246     .    Latest Ref Rng & Units 04/06/2024    7:53 AM 04/01/2024   12:59 PM 03/24/2024   11:43 AM  CMP  Glucose 70 - 99 mg/dL 78  409  811   BUN 6 - 20 mg/dL 11  19  14    Creatinine 0.44 - 1.00 mg/dL 9.14  7.82  9.56   Sodium 135 - 145 mmol/L 144  142  141   Potassium 3.5 - 5.1 mmol/L 3.4  3.7  3.6   Chloride 98 - 111 mmol/L 106  104  106   CO2 22 - 32 mmol/L 32  32  30   Calcium  8.9 - 10.3 mg/dL 8.6  9.1  9.0   Total Protein 6.5 - 8.1 g/dL 6.8  7.4  7.7   Total Bilirubin 0.0 - 1.2 mg/dL 0.5  0.4  0.4  Alkaline Phos 38 - 126 U/L 83  85  61   AST 15 - 41 U/L 8  9  10    ALT 0 - 44 U/L 11  9  11     02/05/2024 Bone Marrow Biopsy:       RADIOGRAPHIC STUDIES: I have personally reviewed the radiological images as listed and agreed with the findings in the report. No results found.   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 Patient has a globulin gap and Anemia and bone survey shows multiple bone lesions, Overall presentation is certainly concerning for multiple myeloma. Other paraneoplastic hypercalcemia is also possibility. 25-hydroxy vitamin D  levels are within normal limits at 66. Status post 1 dose of Zometa    #2 dehydration and volume contraction due to hypercalcemia.   #3  Hypertension #4 dyslipidemia #5 COPD #6 prediabetes #7 multiple electrolyte abnormalities including hypokalemia, hypophosphatemia and severe hypercalcemia. #8 multiple osseous  lesions concerning for multiple myeloma.  Also noted to have left femoral intertrochanteric lesion. #9 history of T6 compression fracture was likely related to multiple myeloma.  Mild pain at this time.  PLAN:  -Discussed lab results on 04/14/2024 in detail with patient. CBC showed WBC of 6.3K, hemoglobin of 11.6, and platelets of 292K. -Anemia is nearly resolved -last myeloma labs from 04/01/2024 showed lambda free light chains are completely normalized at 16.5 -patient has had excellent response to treatment -her monoclonal protein, which was previously 3.2 g/dL one month ago prior to treatment, is now improved to 1.2 g/dL. M protein has dropped by nealry 60-70% -discussed that there would typically be 4-6 cycles of standard treatment with goal of bringing down abnormal protein close to 0, with at least 90% reduction.  -liver and spleen did not appear to be enlarged based on physical examination -continue Daratumumab /velcade /Dexamethasone   -patient is appropriate to proceed with cycle 2 day 15 of treatment today -continue acyclovir   -will cut post-treatment dexamethasone  from 5 tablets (20 MG) to 3 tablets, then 2 tablets with her following treatment, then 1 tablet, then stop  -we will also eventually wean down her pre-treatment steroids after her post-treatment steroids -discussed that muscle tightness can cause rib cage discomfort -discussed that there will be a role for discussion regarding consideration of maintenance treatment down the line with a single medication -recommend patient to eat as well as she can, stay regularly-hydrated, and stay physically active -okay to use treadmill for physical exercise -okay to lift low-weights, 5-10 pounds to keep the muscles toned. Avoid major weight training and straining the back too much with weight lifting as there can be risk of fractures. -discussed that between addressing her myeloma and treating with Zometa , her bones should continue to heal  and the risk of fractures should continue to decrease -answered all of patient's questions in detail  FOLLOW-UP: Per integrated treatment  The total time spent in the appointment was 30 minutes* .  All of the patient's questions were answered with apparent satisfaction. The patient knows to call the clinic with any problems, questions or concerns.   Jacquelyn Matt MD MS AAHIVMS [facility] Hematology/Oncology Physician [facility]  .*Total Encounter Time as defined by the Centers for Medicare and Medicaid Services includes, in addition to  the face-to-face time of a patient visit (documented in the note above) non-face-to-face time: obtaining and reviewing outside history, ordering and reviewing medications, tests or procedures, care coordination (communications with other health care professionals or caregivers) and documentation in the medical record.    I,Mitra Faeizi,acting as a Neurosurgeon for Jacquelyn Matt, MD.,have documented all relevant documentation on the behalf of Jacquelyn Matt, MD,as directed by  Jacquelyn Matt, MD while in the presence of Jacquelyn Matt, MD.  .I have reviewed the above documentation for accuracy and completeness, and I agree with the above. .Jorgeluis Gurganus Kishore Tyreak Reagle MD

## 2024-04-13 NOTE — Progress Notes (Signed)
    SUBJECTIVE:   CHIEF COMPLAINT / HPI:   HTN/Leg edema: Compliant with Lasix  40 mg QD and Losartan  50 mg QD. She never got on the 10 mg Hydralazine . Home BP is in the one-teen systolic and 50 - 70 diastolic. Her leg swelling improved, but she wishes it improved more.  Electrolyte imbalance: She is compliant with her Potassium supplement. Will need refills. She said she gets lab work done with her Oncologist every week to check potassium, calcium , and kidney function.   PERTINENT  PMH / PSH: PMhx reviewed  OBJECTIVE:   BP (!) 114/58   Pulse 77   Ht 5\' 1"  (1.549 m)   Wt 281 lb 2 oz (127.5 kg)   LMP  (LMP Unknown)   SpO2 98%   BMI 53.12 kg/m   Physical Exam Vitals and nursing note reviewed.  Cardiovascular:     Rate and Rhythm: Normal rate and regular rhythm.     Heart sounds: Normal heart sounds. No murmur heard. Pulmonary:     Effort: Pulmonary effort is normal. No respiratory distress.     Breath sounds: Normal breath sounds. No wheezing or rhonchi.  Musculoskeletal:     Comments: + edema B/L LL from ankles to a few inches below her knees - much improved from last exam      ASSESSMENT/PLAN:   Assessment & Plan Essential hypertension D/C Hydralazine  altogether Continue current dose of Losartan  50 mg every day and Lasix  40 mg every day Monitor BP closely  Hypokalemia I reviewed lab from oncology K+ mildly low Continue supplement KDur refilled Continue lab check with oncology Hypercalcemia of malignancy On Lasix  Calcium  slightly low from last lab done Monitor closely with repeat lab in 1 week with oncology Edema leg Will no change her Lasix  dose given her soft BP Perhaps reduce Losartan  dose in the future to accommodate higher dose of Lasix  if needed Reduce salt intake, keep leg elevated and wear compression stockings recommended Monitor closely for now    Penni Bowman, MD [facility]

## 2024-04-13 NOTE — Assessment & Plan Note (Addendum)
 On Lasix  Calcium  slightly low from last lab done Monitor closely with repeat lab in 1 week with oncology

## 2024-04-14 ENCOUNTER — Inpatient Hospital Stay

## 2024-04-14 ENCOUNTER — Inpatient Hospital Stay (HOSPITAL_BASED_OUTPATIENT_CLINIC_OR_DEPARTMENT_OTHER): Admitting: Hematology

## 2024-04-14 VITALS — BP 150/84 | HR 97 | Temp 97.7°F | Resp 20 | Wt 281.5 lb

## 2024-04-14 DIAGNOSIS — C9000 Multiple myeloma not having achieved remission: Secondary | ICD-10-CM

## 2024-04-14 DIAGNOSIS — Z5111 Encounter for antineoplastic chemotherapy: Secondary | ICD-10-CM | POA: Diagnosis not present

## 2024-04-14 LAB — CBC WITH DIFFERENTIAL (CANCER CENTER ONLY)
Abs Immature Granulocytes: 0.01 10*3/uL (ref 0.00–0.07)
Basophils Absolute: 0 10*3/uL (ref 0.0–0.1)
Basophils Relative: 0 %
Eosinophils Absolute: 0.1 10*3/uL (ref 0.0–0.5)
Eosinophils Relative: 1 %
HCT: 35.9 % — ABNORMAL LOW (ref 36.0–46.0)
Hemoglobin: 11.6 g/dL — ABNORMAL LOW (ref 12.0–15.0)
Immature Granulocytes: 0 %
Lymphocytes Relative: 25 %
Lymphs Abs: 1.6 10*3/uL (ref 0.7–4.0)
MCH: 27 pg (ref 26.0–34.0)
MCHC: 32.3 g/dL (ref 30.0–36.0)
MCV: 83.7 fL (ref 80.0–100.0)
Monocytes Absolute: 0.4 10*3/uL (ref 0.1–1.0)
Monocytes Relative: 6 %
Neutro Abs: 4.3 10*3/uL (ref 1.7–7.7)
Neutrophils Relative %: 68 %
Platelet Count: 292 10*3/uL (ref 150–400)
RBC: 4.29 MIL/uL (ref 3.87–5.11)
RDW: 17.6 % — ABNORMAL HIGH (ref 11.5–15.5)
WBC Count: 6.3 10*3/uL (ref 4.0–10.5)
nRBC: 0 % (ref 0.0–0.2)

## 2024-04-14 MED ORDER — BORTEZOMIB CHEMO SQ INJECTION 3.5 MG (2.5MG/ML)
1.5000 mg/m2 | Freq: Once | INTRAMUSCULAR | Status: AC
  Administered 2024-04-14: 3.75 mg via SUBCUTANEOUS
  Filled 2024-04-14: qty 1.5

## 2024-04-14 MED ORDER — DARATUMUMAB-HYALURONIDASE-FIHJ 1800-30000 MG-UT/15ML ~~LOC~~ SOLN
1800.0000 mg | Freq: Once | SUBCUTANEOUS | Status: AC
  Administered 2024-04-14: 1800 mg via SUBCUTANEOUS
  Filled 2024-04-14: qty 15

## 2024-04-14 MED ORDER — FAMOTIDINE 20 MG PO TABS
20.0000 mg | ORAL_TABLET | Freq: Once | ORAL | Status: AC
  Administered 2024-04-14: 20 mg via ORAL
  Filled 2024-04-14: qty 1

## 2024-04-14 MED ORDER — ACETAMINOPHEN 325 MG PO TABS
650.0000 mg | ORAL_TABLET | Freq: Once | ORAL | Status: AC
  Administered 2024-04-14: 650 mg via ORAL
  Filled 2024-04-14: qty 2

## 2024-04-14 MED ORDER — DEXAMETHASONE 4 MG PO TABS
20.0000 mg | ORAL_TABLET | Freq: Once | ORAL | Status: AC
  Administered 2024-04-14: 20 mg via ORAL
  Filled 2024-04-14: qty 5

## 2024-04-14 MED ORDER — DIPHENHYDRAMINE HCL 25 MG PO CAPS
50.0000 mg | ORAL_CAPSULE | Freq: Once | ORAL | Status: AC
  Administered 2024-04-14: 50 mg via ORAL
  Filled 2024-04-14: qty 2

## 2024-04-14 MED ORDER — ZOLEDRONIC ACID 4 MG/100ML IV SOLN
4.0000 mg | Freq: Once | INTRAVENOUS | Status: AC
  Administered 2024-04-14: 4 mg via INTRAVENOUS
  Filled 2024-04-14: qty 100

## 2024-04-14 NOTE — Patient Instructions (Signed)
 [facility] - A DEPT OF [facility]  Discharge Instructions: Thank you for choosing [facility] to provide your oncology and hematology care.   If you have a lab appointment with the Cancer Center, please go directly to the Cancer Center and check in at the registration area.   Wear comfortable clothing and clothing appropriate for easy access to any Portacath or PICC line.   We strive to give you quality time with your provider. You may need to reschedule your appointment if you arrive late (15 or more minutes).  Arriving late affects you and other patients whose appointments are after yours.  Also, if you miss three or more appointments without notifying the office, you may be dismissed from the clinic at the provider's discretion.      For prescription refill requests, have your pharmacy contact our office and allow 72 hours for refills to be completed.    Today you received the following chemotherapy and/or immunotherapy agents: bortezomib and daratumumab-hyaluronidase-fihj      To help prevent nausea and vomiting after your treatment, we encourage you to take your nausea medication as directed.  BELOW ARE SYMPTOMS THAT SHOULD BE REPORTED IMMEDIATELY: *FEVER GREATER THAN 100.4 F (38 C) OR HIGHER *CHILLS OR SWEATING *NAUSEA AND VOMITING THAT IS NOT CONTROLLED WITH YOUR NAUSEA MEDICATION *UNUSUAL SHORTNESS OF BREATH *UNUSUAL BRUISING OR BLEEDING *URINARY PROBLEMS (pain or burning when urinating, or frequent urination) *BOWEL PROBLEMS (unusual diarrhea, constipation, pain near the anus) TENDERNESS IN MOUTH AND THROAT WITH OR WITHOUT PRESENCE OF ULCERS (sore throat, sores in mouth, or a toothache) UNUSUAL RASH, SWELLING OR PAIN  UNUSUAL VAGINAL DISCHARGE OR ITCHING   Items with * indicate a potential emergency and should be followed up as soon as possible or go to the Emergency Department if any problems should occur.  Please show the  CHEMOTHERAPY ALERT CARD or IMMUNOTHERAPY ALERT CARD at check-in to the Emergency Department and triage nurse.  Should you have questions after your visit or need to cancel or reschedule your appointment, please contact [facility] DEPT [facility]  Dept: [phone]  and follow the prompts.  Office hours are 8:00 a.m. to 4:30 p.m. Monday - Friday. Please note that voicemails left after 4:00 p.m. may not be returned until the following business day.  We are closed weekends and major holidays. You have access to a nurse at all times for urgent questions. Please call the main number to the clinic Dept: [phone] and follow the prompts.   For any non-urgent questions, you may also contact your provider using MyChart. We now offer e-Visits for anyone 79 and older to request care online for non-urgent symptoms. For details visit [email or web address].   Also download the MyChart app! Go to the app store, search "MyChart", open the app, select [name], and log in with your MyChart username and password.

## 2024-04-15 ENCOUNTER — Other Ambulatory Visit (INDEPENDENT_AMBULATORY_CARE_PROVIDER_SITE_OTHER): Payer: Self-pay | Admitting: Family Medicine

## 2024-04-15 DIAGNOSIS — R7303 Prediabetes: Secondary | ICD-10-CM

## 2024-04-15 DIAGNOSIS — R632 Polyphagia: Secondary | ICD-10-CM

## 2024-04-15 NOTE — Telephone Encounter (Signed)
 6/12 pt needs refill on wegovey .5

## 2024-04-16 ENCOUNTER — Other Ambulatory Visit (INDEPENDENT_AMBULATORY_CARE_PROVIDER_SITE_OTHER): Payer: Self-pay | Admitting: Family Medicine

## 2024-04-16 DIAGNOSIS — R7303 Prediabetes: Secondary | ICD-10-CM

## 2024-04-16 DIAGNOSIS — R632 Polyphagia: Secondary | ICD-10-CM

## 2024-04-18 ENCOUNTER — Encounter: Payer: Self-pay | Admitting: Hematology

## 2024-04-19 ENCOUNTER — Other Ambulatory Visit (HOSPITAL_COMMUNITY): Payer: Self-pay

## 2024-04-19 MED ORDER — WEGOVY 0.5 MG/0.5ML ~~LOC~~ SOAJ
0.5000 mg | SUBCUTANEOUS | 0 refills | Status: DC
  Filled 2024-04-19: qty 2, 28d supply, fill #0

## 2024-04-19 NOTE — Telephone Encounter (Signed)
 LAST APPOINTMENT DATE: 03/24/2024 NEXT APPOINTMENT DATE: 04/29/2024   [place] [facility] [address] [place] [address] Phone: [phone] Fax: [phone]  Patient is requesting a refill of the following medications: Requested Prescriptions    No prescriptions requested or ordered in this encounter    Date last filled: 03/24/2024 Previously prescribed by Dr Alvia Awkward  Lab Results  Component Value Date   HGBA1C 6.3 (H) 01/29/2024   HGBA1C 6.2 (H) 07/29/2023   HGBA1C 6.3 (H) 03/25/2023   Lab Results  Component Value Date   LDLCALC 77 02/10/2024   CREATININE 0.50 04/06/2024   Lab Results  Component Value Date   VD25OH 66.9 01/29/2024   VD25OH 70.4 07/29/2023   VD25OH 63.8 03/25/2023    BP Readings from Last 3 Encounters:  04/14/24 (!) 150/84  04/13/24 (!) 114/58  04/06/24 120/71

## 2024-04-20 ENCOUNTER — Other Ambulatory Visit (HOSPITAL_COMMUNITY): Payer: Self-pay

## 2024-04-21 ENCOUNTER — Other Ambulatory Visit

## 2024-04-21 ENCOUNTER — Inpatient Hospital Stay

## 2024-04-21 ENCOUNTER — Ambulatory Visit

## 2024-04-21 ENCOUNTER — Ambulatory Visit: Admitting: Hematology

## 2024-04-21 ENCOUNTER — Encounter: Payer: Self-pay | Admitting: Hematology

## 2024-04-21 VITALS — BP 134/67 | HR 81 | Temp 97.7°F | Resp 18 | Wt 287.0 lb

## 2024-04-21 DIAGNOSIS — C9000 Multiple myeloma not having achieved remission: Secondary | ICD-10-CM

## 2024-04-21 DIAGNOSIS — Z5111 Encounter for antineoplastic chemotherapy: Secondary | ICD-10-CM | POA: Diagnosis not present

## 2024-04-21 LAB — CMP (CANCER CENTER ONLY)
ALT: 13 U/L (ref 0–44)
AST: 11 U/L — ABNORMAL LOW (ref 15–41)
Albumin: 4.2 g/dL (ref 3.5–5.0)
Alkaline Phosphatase: 68 U/L (ref 38–126)
Anion gap: 8 (ref 5–15)
BUN: 10 mg/dL (ref 6–20)
CO2: 29 mmol/L (ref 22–32)
Calcium: 9.4 mg/dL (ref 8.9–10.3)
Chloride: 104 mmol/L (ref 98–111)
Creatinine: 0.52 mg/dL (ref 0.44–1.00)
GFR, Estimated: >60 mL/min (ref >60)
Glucose, Bld: 93 mg/dL (ref 70–99)
Potassium: 3.4 mmol/L — ABNORMAL LOW (ref 3.5–5.1)
Sodium: 141 mmol/L (ref 135–145)
Total Bilirubin: 0.5 mg/dL (ref 0.0–1.2)
Total Protein: 7 g/dL (ref 6.5–8.1)

## 2024-04-21 LAB — CBC WITH DIFFERENTIAL (CANCER CENTER ONLY)
Abs Immature Granulocytes: 0.01 10*3/uL (ref 0.00–0.07)
Basophils Absolute: 0 10*3/uL (ref 0.0–0.1)
Basophils Relative: 0 %
Eosinophils Absolute: 0.1 10*3/uL (ref 0.0–0.5)
Eosinophils Relative: 1 %
HCT: 34.4 % — ABNORMAL LOW (ref 36.0–46.0)
Hemoglobin: 11 g/dL — ABNORMAL LOW (ref 12.0–15.0)
Immature Granulocytes: 0 %
Lymphocytes Relative: 23 %
Lymphs Abs: 1.7 10*3/uL (ref 0.7–4.0)
MCH: 27.2 pg (ref 26.0–34.0)
MCHC: 32 g/dL (ref 30.0–36.0)
MCV: 84.9 fL (ref 80.0–100.0)
Monocytes Absolute: 0.4 10*3/uL (ref 0.1–1.0)
Monocytes Relative: 6 %
Neutro Abs: 5 10*3/uL (ref 1.7–7.7)
Neutrophils Relative %: 70 %
Platelet Count: 336 10*3/uL (ref 150–400)
RBC: 4.05 MIL/uL (ref 3.87–5.11)
RDW: 17.3 % — ABNORMAL HIGH (ref 11.5–15.5)
WBC Count: 7.2 10*3/uL (ref 4.0–10.5)
nRBC: 0 % (ref 0.0–0.2)

## 2024-04-21 MED ORDER — ACETAMINOPHEN 325 MG PO TABS
650.0000 mg | ORAL_TABLET | Freq: Once | ORAL | Status: AC
  Administered 2024-04-21: 650 mg via ORAL
  Filled 2024-04-21: qty 2

## 2024-04-21 MED ORDER — BORTEZOMIB CHEMO SQ INJECTION 3.5 MG (2.5MG/ML)
1.5000 mg/m2 | Freq: Once | INTRAMUSCULAR | Status: AC
  Administered 2024-04-21: 3.75 mg via SUBCUTANEOUS
  Filled 2024-04-21: qty 1.5

## 2024-04-21 MED ORDER — DARATUMUMAB-HYALURONIDASE-FIHJ 1800-30000 MG-UT/15ML ~~LOC~~ SOLN
1800.0000 mg | Freq: Once | SUBCUTANEOUS | Status: AC
  Administered 2024-04-21: 1800 mg via SUBCUTANEOUS
  Filled 2024-04-21: qty 15

## 2024-04-21 MED ORDER — DIPHENHYDRAMINE HCL 25 MG PO CAPS
50.0000 mg | ORAL_CAPSULE | Freq: Once | ORAL | Status: AC
  Administered 2024-04-21: 50 mg via ORAL
  Filled 2024-04-21: qty 2

## 2024-04-21 MED ORDER — DEXAMETHASONE 4 MG PO TABS
20.0000 mg | ORAL_TABLET | Freq: Once | ORAL | Status: AC
  Administered 2024-04-21: 20 mg via ORAL
  Filled 2024-04-21: qty 5

## 2024-04-21 MED ORDER — FAMOTIDINE 20 MG PO TABS
20.0000 mg | ORAL_TABLET | Freq: Once | ORAL | Status: AC
  Administered 2024-04-21: 20 mg via ORAL
  Filled 2024-04-21: qty 1

## 2024-04-21 NOTE — Patient Instructions (Signed)
 [facility] - A DEPT OF [facility]  Discharge Instructions: Thank you for choosing [facility] to provide your oncology and hematology care.   If you have a lab appointment with the Cancer Center, please go directly to the Cancer Center and check in at the registration area.   Wear comfortable clothing and clothing appropriate for easy access to any Portacath or PICC line.   We strive to give you quality time with your provider. You may need to reschedule your appointment if you arrive late (15 or more minutes).  Arriving late affects you and other patients whose appointments are after yours.  Also, if you miss three or more appointments without notifying the office, you may be dismissed from the clinic at the provider's discretion.      For prescription refill requests, have your pharmacy contact our office and allow 72 hours for refills to be completed.    Today you received the following chemotherapy and/or immunotherapy agents: Velcade/Darzalex Faspro      To help prevent nausea and vomiting after your treatment, we encourage you to take your nausea medication as directed.  BELOW ARE SYMPTOMS THAT SHOULD BE REPORTED IMMEDIATELY: *FEVER GREATER THAN 100.4 F (38 C) OR HIGHER *CHILLS OR SWEATING *NAUSEA AND VOMITING THAT IS NOT CONTROLLED WITH YOUR NAUSEA MEDICATION *UNUSUAL SHORTNESS OF BREATH *UNUSUAL BRUISING OR BLEEDING *URINARY PROBLEMS (pain or burning when urinating, or frequent urination) *BOWEL PROBLEMS (unusual diarrhea, constipation, pain near the anus) TENDERNESS IN MOUTH AND THROAT WITH OR WITHOUT PRESENCE OF ULCERS (sore throat, sores in mouth, or a toothache) UNUSUAL RASH, SWELLING OR PAIN  UNUSUAL VAGINAL DISCHARGE OR ITCHING   Items with * indicate a potential emergency and should be followed up as soon as possible or go to the Emergency Department if any problems should occur.  Please show the CHEMOTHERAPY ALERT CARD or  IMMUNOTHERAPY ALERT CARD at check-in to the Emergency Department and triage nurse.  Should you have questions after your visit or need to cancel or reschedule your appointment, please contact [facility] - A DEPT OF [facility]  Dept: [phone]  and follow the prompts.  Office hours are 8:00 a.m. to 4:30 p.m. Monday - Friday. Please note that voicemails left after 4:00 p.m. may not be returned until the following business day.  We are closed weekends and major holidays. You have access to a nurse at all times for urgent questions. Please call the main number to the clinic Dept: [phone] and follow the prompts.   For any non-urgent questions, you may also contact your provider using MyChart. We now offer e-Visits for anyone 25 and older to request care online for non-urgent symptoms. For details visit [email or web address].   Also download the MyChart app! Go to the app store, search "MyChart", open the app, select [place], and log in with your MyChart username and password.

## 2024-04-22 LAB — KAPPA/LAMBDA LIGHT CHAINS
Kappa free light chain: 5 mg/L (ref 3.3–19.4)
Kappa, lambda light chain ratio: 0.77 (ref 0.26–1.65)
Lambda free light chains: 6.5 mg/L (ref 5.7–26.3)

## 2024-04-23 LAB — MULTIPLE MYELOMA PANEL, SERUM
Albumin SerPl Elph-Mcnc: 3.6 g/dL (ref 2.9–4.4)
Albumin/Glob SerPl: 1.2 (ref 0.7–1.7)
Alpha 1: 0.3 g/dL (ref 0.0–0.4)
Alpha2 Glob SerPl Elph-Mcnc: 1 g/dL (ref 0.4–1.0)
B-Globulin SerPl Elph-Mcnc: 1.1 g/dL (ref 0.7–1.3)
Gamma Glob SerPl Elph-Mcnc: 0.9 g/dL (ref 0.4–1.8)
Globulin, Total: 3.1 g/dL (ref 2.2–3.9)
IgA: 16 mg/dL — ABNORMAL LOW (ref 87–352)
IgG (Immunoglobin G), Serum: 941 mg/dL (ref 586–1602)
IgM (Immunoglobulin M), Srm: 17 mg/dL — ABNORMAL LOW (ref 26–217)
M Protein SerPl Elph-Mcnc: 0.6 g/dL — ABNORMAL HIGH
Total Protein ELP: 6.7 g/dL (ref 6.0–8.5)

## 2024-04-26 ENCOUNTER — Encounter: Payer: Self-pay | Admitting: Hematology

## 2024-04-26 ENCOUNTER — Other Ambulatory Visit (HOSPITAL_COMMUNITY): Payer: Self-pay

## 2024-04-26 ENCOUNTER — Other Ambulatory Visit: Payer: Self-pay | Admitting: Family Medicine

## 2024-04-26 MED ORDER — ALBUTEROL SULFATE HFA 108 (90 BASE) MCG/ACT IN AERS
2.0000 | INHALATION_SPRAY | Freq: Four times a day (QID) | RESPIRATORY_TRACT | 2 refills | Status: DC | PRN
  Filled 2024-04-26: qty 18, 30d supply, fill #0
  Filled 2024-05-04 – 2024-06-03 (×2): qty 18, 30d supply, fill #1
  Filled 2024-07-21: qty 18, 30d supply, fill #2

## 2024-04-27 ENCOUNTER — Inpatient Hospital Stay

## 2024-04-27 ENCOUNTER — Inpatient Hospital Stay (HOSPITAL_BASED_OUTPATIENT_CLINIC_OR_DEPARTMENT_OTHER): Admitting: Hematology

## 2024-04-27 ENCOUNTER — Encounter: Payer: Self-pay | Admitting: Hematology

## 2024-04-27 VITALS — BP 138/76 | HR 78 | Temp 97.8°F | Resp 18

## 2024-04-27 DIAGNOSIS — C9000 Multiple myeloma not having achieved remission: Secondary | ICD-10-CM

## 2024-04-27 DIAGNOSIS — Z5111 Encounter for antineoplastic chemotherapy: Secondary | ICD-10-CM | POA: Diagnosis not present

## 2024-04-27 LAB — CBC WITH DIFFERENTIAL (CANCER CENTER ONLY)
Abs Immature Granulocytes: 0.03 10*3/uL (ref 0.00–0.07)
Basophils Absolute: 0 10*3/uL (ref 0.0–0.1)
Basophils Relative: 0 %
Eosinophils Absolute: 0.1 10*3/uL (ref 0.0–0.5)
Eosinophils Relative: 1 %
HCT: 36.5 % (ref 36.0–46.0)
Hemoglobin: 11.8 g/dL — ABNORMAL LOW (ref 12.0–15.0)
Immature Granulocytes: 0 %
Lymphocytes Relative: 18 %
Lymphs Abs: 1.8 10*3/uL (ref 0.7–4.0)
MCH: 26.9 pg (ref 26.0–34.0)
MCHC: 32.3 g/dL (ref 30.0–36.0)
MCV: 83.3 fL (ref 80.0–100.0)
Monocytes Absolute: 0.5 10*3/uL (ref 0.1–1.0)
Monocytes Relative: 5 %
Neutro Abs: 7.4 10*3/uL (ref 1.7–7.7)
Neutrophils Relative %: 76 %
Platelet Count: 250 10*3/uL (ref 150–400)
RBC: 4.38 MIL/uL (ref 3.87–5.11)
RDW: 16.8 % — ABNORMAL HIGH (ref 11.5–15.5)
WBC Count: 9.8 10*3/uL (ref 4.0–10.5)
nRBC: 0 % (ref 0.0–0.2)

## 2024-04-27 LAB — CMP (CANCER CENTER ONLY)
ALT: 11 U/L (ref 0–44)
AST: 9 U/L — ABNORMAL LOW (ref 15–41)
Albumin: 4.3 g/dL (ref 3.5–5.0)
Alkaline Phosphatase: 56 U/L (ref 38–126)
Anion gap: 8 (ref 5–15)
BUN: 13 mg/dL (ref 6–20)
CO2: 30 mmol/L (ref 22–32)
Calcium: 9.8 mg/dL (ref 8.9–10.3)
Chloride: 104 mmol/L (ref 98–111)
Creatinine: 0.56 mg/dL (ref 0.44–1.00)
GFR, Estimated: >60 mL/min (ref >60)
Glucose, Bld: 88 mg/dL (ref 70–99)
Potassium: 3.3 mmol/L — ABNORMAL LOW (ref 3.5–5.1)
Sodium: 142 mmol/L (ref 135–145)
Total Bilirubin: 0.8 mg/dL (ref 0.0–1.2)
Total Protein: 7 g/dL (ref 6.5–8.1)

## 2024-04-27 MED ORDER — DIPHENHYDRAMINE HCL 25 MG PO CAPS
50.0000 mg | ORAL_CAPSULE | Freq: Once | ORAL | Status: AC
  Administered 2024-04-27: 50 mg via ORAL
  Filled 2024-04-27: qty 2

## 2024-04-27 MED ORDER — ACETAMINOPHEN 325 MG PO TABS
650.0000 mg | ORAL_TABLET | Freq: Once | ORAL | Status: AC
  Administered 2024-04-27: 650 mg via ORAL
  Filled 2024-04-27: qty 2

## 2024-04-27 MED ORDER — DEXAMETHASONE 4 MG PO TABS
20.0000 mg | ORAL_TABLET | Freq: Once | ORAL | Status: AC
  Administered 2024-04-27: 20 mg via ORAL
  Filled 2024-04-27: qty 5

## 2024-04-27 MED ORDER — FAMOTIDINE 20 MG PO TABS
20.0000 mg | ORAL_TABLET | Freq: Once | ORAL | Status: AC
  Administered 2024-04-27: 20 mg via ORAL
  Filled 2024-04-27: qty 1

## 2024-04-27 MED ORDER — BORTEZOMIB CHEMO SQ INJECTION 3.5 MG (2.5MG/ML)
1.5000 mg/m2 | Freq: Once | INTRAMUSCULAR | Status: AC
  Administered 2024-04-27: 3.75 mg via SUBCUTANEOUS
  Filled 2024-04-27: qty 1.5

## 2024-04-27 MED ORDER — DARATUMUMAB-HYALURONIDASE-FIHJ 1800-30000 MG-UT/15ML ~~LOC~~ SOLN
1800.0000 mg | Freq: Once | SUBCUTANEOUS | Status: AC
  Administered 2024-04-27: 1800 mg via SUBCUTANEOUS
  Filled 2024-04-27: qty 15

## 2024-04-27 NOTE — Patient Instructions (Signed)
 [facility] - A DEPT OF [facility]  Discharge Instructions: Thank you for choosing [facility] to provide your oncology and hematology care.   If you have a lab appointment with the Cancer Center, please go directly to the Cancer Center and check in at the registration area.   Wear comfortable clothing and clothing appropriate for easy access to any Portacath or PICC line.   We strive to give you quality time with your provider. You may need to reschedule your appointment if you arrive late (15 or more minutes).  Arriving late affects you and other patients whose appointments are after yours.  Also, if you miss three or more appointments without notifying the office, you may be dismissed from the clinic at the provider's discretion.      For prescription refill requests, have your pharmacy contact our office and allow 72 hours for refills to be completed.    Today you received the following chemotherapy and/or immunotherapy agents: bortezomib  and daratumumab       To help prevent nausea and vomiting after your treatment, we encourage you to take your nausea medication as directed.  BELOW ARE SYMPTOMS THAT SHOULD BE REPORTED IMMEDIATELY: *FEVER GREATER THAN 100.4 F (38 C) OR HIGHER *CHILLS OR SWEATING *NAUSEA AND VOMITING THAT IS NOT CONTROLLED WITH YOUR NAUSEA MEDICATION *UNUSUAL SHORTNESS OF BREATH *UNUSUAL BRUISING OR BLEEDING *URINARY PROBLEMS (pain or burning when urinating, or frequent urination) *BOWEL PROBLEMS (unusual diarrhea, constipation, pain near the anus) TENDERNESS IN MOUTH AND THROAT WITH OR WITHOUT PRESENCE OF ULCERS (sore throat, sores in mouth, or a toothache) UNUSUAL RASH, SWELLING OR PAIN  UNUSUAL VAGINAL DISCHARGE OR ITCHING   Items with * indicate a potential emergency and should be followed up as soon as possible or go to the Emergency Department if any problems should occur.  Please show the CHEMOTHERAPY ALERT CARD  or IMMUNOTHERAPY ALERT CARD at check-in to the Emergency Department and triage nurse.  Should you have questions after your visit or need to cancel or reschedule your appointment, please contact [facility] - A DEPT OF [facility]  Dept: [phone]  and follow the prompts.  Office hours are 8:00 a.m. to 4:30 p.m. Monday - Friday. Please note that voicemails left after 4:00 p.m. may not be returned until the following business day.  We are closed weekends and major holidays. You have access to a nurse at all times for urgent questions. Please call the main number to the clinic Dept: [phone] and follow the prompts.   For any non-urgent questions, you may also contact your provider using MyChart. We now offer e-Visits for anyone 7 and older to request care online for non-urgent symptoms. For details visit [email or web address].   Also download the MyChart app! Go to the app store, search "MyChart", open the app, select [facility], and log in with your MyChart username and password.

## 2024-04-27 NOTE — Progress Notes (Signed)
 HEMATOLOGY/ONCOLOGY CLINIC NOTE  Date of Service: .04/27/2024   Patient Care Team: Anders Otto DASEN, MD as PCP - General (Family Medicine) Rosemarie [name] RAMAN, MD as Consulting Physician (Neurology) Wonda Cy BROCKS, RD as Dietitian (Family Medicine) Onesimo Emaline Brink, MD as Consulting Physician (Hematology)  CHIEF COMPLAINTS/PURPOSE OF CONSULTATION:  F/u for evaluation and mx of Multiple myeloma  HISTORY OF PRESENTING ILLNESS:  Carla Hanson is a wonderful 58 y.o. 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   Patient has a history of hypertension, dyslipidemia, COPD, history of PE/DVT, prediabetes, obesity who notes that she was working out in the gym and had significant back pain resulting in an x-ray that showed T6 compression fracture in January 2025.   She had a follow-up with healthy weight [facility] wellness clinic on 3/27 and reported symptoms of significant weakness dizziness lightheadedness and dehydration despite drinking a lot of water.  She also noted nausea but no emesis and constipation. Patient was seen in the emergency room and noted to have severe hypercalcemia with a calcium  level of 17.8 with an albumin of 3.8, total protein was elevated at 9.1 with an increased globulin gap, potassium was 2.7. 25-hydroxy vitamin D  levels were within normal limits at 66.9. TSH was normal at 0.736. PTH levels were appropriately suppressed at 6 which is suggestive of non hyperparathyroid hypercalcemia. CBC-on admission showed normal hemoglobin of 13.1 in the context of significant hemoconcentration.  This might be hiding her baseline anemia which might declare itself once the patient's volume status is replaced.  No other cytopenias. Patient notes no acute new focal bone pains.   Patient notes no fevers no chills no night sweats. No previous history of cancers. Her last mammogram was on 02/07/2023 and was  within normal limits.  She is previously noted to have a benign lipoma in the right breast which was resected. Patient has been ex-smoker and smoked half to 1 pack a day for 35 years and quit in December 2017.   New focal symptoms. Patient has received 1 dose of Zometa  and IV fluids and calcium  levels today are down to 12 with a corrected calcium  between 13 and 14. She is receiving IV fluids potassium replacement phosphorus  replacement and calcitonin today.   Normal labs have been ordered and patient is pending whole-body skeletal survey.  24-hour urine collection for myeloma has been sent out.   INTERVAL HISTORY:  Carla Hanson is a 59 y.o. female here for follow-up for evaluation and management of multiple myeloma.   She was last seen by me on 04/14/2024 and reported manageable back pain, unusual rib cage sensation, mild SOB only on exertion, and mild leg swelling.   She presents today for toxicity check prior to cycle 3 day 8 of treatment.   She is accompanied by her mother during the visit. Patient reports that she has been doing well overall since her last clinical visit.   She reports that she has been  eating well, sleeping well, and staying well-hydrated.   Patient is noted to have some mild leg swelling at this time, improved from previously. She continues to be on Lasix .   Patient denies any abdominal pain, tingling/numbness in the hands/feet, new back pain, infection issues.   Her pain around the chest wall is mild though it is not acutely painful. She notes that her pain has mildly improved.   Patient notes that she  sometimes limits her salt intake, though not always.   She has been staying active and lifting mild weights.   MEDICAL HISTORY:  Past Medical History:  Diagnosis Date   Alcohol abuse    Anxiety    Back pain    Bipolar disorder (HCC)    Bronchitis    COPD exacerbation (HCC) 05/10/2016   Drug use    Edema 02/24/2024   History of blood clots     Hyperlipidemia    Hypertension    Influenza A 12/01/2018   Joint pain    Left leg DVT (HCC) 09/29/2013   Lipoma    Abdomen   LIPOMA 01/20/2008   Obesity    Occasional tremors 05/12/2019   Osteoarthritis of right knee    Other fatigue    Painful menstrual periods 01/05/2012   Pneumonia    Prediabetes    Seizure (HCC)    Shortness of breath on exertion    Stroke (HCC)    Tobacco abuse     SURGICAL HISTORY: Past Surgical History:  Procedure Laterality Date   CESAREAN SECTION     CYSTECTOMY     LIPOMA EXCISION  03/2011   ORIF ANKLE FRACTURE  03/13/2012   Procedure: OPEN REDUCTION INTERNAL FIXATION (ORIF) ANKLE FRACTURE;  Surgeon: Lynwood FORBES Better, MD;  Location: [facility];  Service: Orthopedics;  Laterality: Right;   TUBAL LIGATION      SOCIAL HISTORY: Social History   Socioeconomic History   Marital status: Divorced    Spouse name: Not on file   Number of children: 4   Years of education: 10   Highest education level: Not on file  Occupational History   Occupation: stay at home  Tobacco Use   Smoking status: Former    Current packs/day: 0.00    Average packs/day: 0.5 packs/day for 35.0 years (17.5 ttl pk-yrs)    Types: Cigarettes    Start date: 11/04/1981    Quit date: 10/2016    Years since quitting: 7.5   Smokeless tobacco: Never  Vaping Use   Vaping status: Never Used  Substance and Sexual Activity   Alcohol use: No    Alcohol/week: 0.0 standard drinks of alcohol    Comment: Quit 10/2016   Drug use: Yes    Types: Marijuana   Sexual activity: Not Currently    Comment: tubal  Other Topics Concern   Not on file  Social History Narrative   Unemployed, 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  Considering nursing.    Lives at home alone   Right-handed   Caffeine: not much   Social Drivers of Corporate investment banker Strain: Low Risk  (11/25/2023)   Overall Financial Resource  Strain (CARDIA)    Difficulty of Paying Living Expenses: Not hard at all  Food Insecurity: No Food Insecurity (02/06/2024)   Hunger Vital Sign    Worried About Running Out of Food in the Last Year: Never true    Ran Out of Food in the Last Year: Never true  Transportation Needs: No Transportation Needs (02/06/2024)   PRAPARE - Administrator, Civil Service (Medical): No    Lack of Transportation (Non-Medical): No  Physical Activity: Sufficiently Active (11/25/2023)   Exercise Vital Sign    Days of Exercise per Week: 5 days    Minutes of Exercise per Session: 60 min  Stress: No Stress Concern Present (11/25/2023)   [place]  [employer] - Occupational Stress Questionnaire    Feeling of Stress : Not at all  Social Connections: Moderately Integrated (01/31/2024)   Social Connection and Isolation Panel    Frequency of Communication with Friends and Family: Three times a week    Frequency of Social Gatherings with Friends and Family: Once a week    Attends Religious Services: More than 4 times per year    Active Member of [employer] or Organizations: Yes    Attends Banker Meetings: Patient declined    Marital Status: Divorced  Catering manager Violence: Not At Risk (02/06/2024)   Humiliation, Afraid, Rape, and Kick questionnaire    Fear of Current or Ex-Partner: No    Emotionally Abused: No    Physically Abused: No    Sexually Abused: No    FAMILY HISTORY: Family History  Problem Relation Age of Onset   Other Mother        High blood pressure runs in the family   Obesity Mother    Asthma Father    Heart attack Father    Obesity Father    Diabetes Maternal Aunt    Breast cancer Neg Hx     ALLERGIES:  is allergic to latuda  [lurasidone ].  MEDICATIONS:  Current Outpatient Medications  Medication Sig Dispense Refill   acetaminophen  (TYLENOL ) 325 MG tablet Take 650 mg by mouth every 6 (six) hours as needed for mild pain (pain score 1-3). (Patient  not taking: Reported on 04/29/2024)     acyclovir  (ZOVIRAX ) 400 MG tablet Take 1 tablet (400 mg total) by mouth 2 (two) times daily. (Patient not taking: Reported on 04/29/2024) 60 tablet 5   acyclovir  (ZOVIRAX ) 400 MG tablet Take 1 tablet (400 mg total) by mouth 2 (two) times daily. 60 tablet 5   albuterol  (PROVENTIL ) (2.5 MG/3ML) 0.083% nebulizer solution Take 3 mLs (2.5 mg total) by nebulization every 6 (six) hours as needed for wheezing or shortness of breath. (Patient not taking: Reported on 04/29/2024) 75 mL 2   albuterol  (VENTOLIN  HFA) 108 (90 Base) MCG/ACT inhaler Inhale 2 puffs into the lungs every 6 (six) hours as needed for wheezing or shortness of breath. 18 g 2   aspirin  EC 81 MG EC tablet Take 1 tablet (81 mg total) by mouth daily. 30 tablet 12   cetirizine  (ZYRTEC ) 10 MG tablet Take 1 tablet (10 mg total) by mouth daily. 90 tablet 1   ferrous sulfate  325 (65 FE) MG tablet Take 325 mg by mouth daily with breakfast.     furosemide  (LASIX ) 40 MG tablet Take 1 tablet (40 mg total) by mouth daily. 90 tablet 1   losartan  (COZAAR ) 50 MG tablet Take 1 tablet (50 mg total) by mouth daily. 30 tablet 1   metFORMIN  (GLUCOPHAGE ) 500 MG tablet Take 1 tablet (500 mg total) by mouth daily with breakfast.     ondansetron  (ZOFRAN ) 8 MG tablet Take 1 tablet (8 mg total) by mouth every 8 (eight) hours as needed for nausea or vomiting. (Patient not taking: Reported on 04/29/2024) 30 tablet 1   potassium chloride  SA (KLOR-CON  M) 20 MEQ tablet Take 1 tablet (20 mEq total) by mouth daily. 30 tablet 2   prochlorperazine  (COMPAZINE ) 10 MG tablet Take 1 tablet (10 mg total) by mouth every 6 (six) hours as needed for nausea or vomiting (Patient not taking: Reported on 04/29/2024) 30 tablet 1   rosuvastatin  (CRESTOR ) 20 MG tablet TAKE 1 TABLET(20 MG) BY MOUTH DAILY 90 tablet  1   Semaglutide -Weight Management (WEGOVY ) 0.5 MG/0.5ML SOAJ Inject 0.5 mg into the skin once a week. 2 mL 0   tiZANidine  (ZANAFLEX ) 4 MG tablet  Take 1 tablet (4 mg total) by mouth 2 (two) times daily as needed for muscle spasms. (Patient not taking: Reported on 04/29/2024) 60 tablet 1   No current facility-administered medications for this visit.    REVIEW OF SYSTEMS:    10 Point review of Systems was done is negative except as noted above.   PHYSICAL EXAMINATION: ECOG PERFORMANCE STATUS: 2 VSS  GENERAL:alert, in no acute distress and comfortable SKIN: no acute rashes, no significant lesions EYES: conjunctiva are pink and non-injected, sclera anicteric OROPHARYNX: MMM, no exudates, no oropharyngeal erythema or ulceration NECK: supple, no JVD LYMPH:  no palpable lymphadenopathy in the cervical, axillary or inguinal regions LUNGS: clear to auscultation b/l with normal respiratory effort HEART: regular rate & rhythm ABDOMEN:  normoactive bowel sounds , non tender, not distended. Extremity: no pedal edema PSYCH: alert & oriented x 3 with fluent speech NEURO: no focal motor/sensory deficits   LABORATORY DATA:  I have reviewed the data as listed  .    Latest Ref Rng & Units 04/27/2024   12:13 PM 04/21/2024    1:17 PM 04/14/2024   11:13 AM  CBC  WBC 4.0 - 10.5 K/uL 9.8  7.2  6.3   Hemoglobin 12.0 - 15.0 g/dL 88.1  88.9  88.3   Hematocrit 36.0 - 46.0 % 36.5  34.4  35.9   Platelets 150 - 400 K/uL 250  336  292     .    Latest Ref Rng & Units 04/27/2024   12:13 PM 04/21/2024    1:17 PM 04/06/2024    7:53 AM  CMP  Glucose 70 - 99 mg/dL 88  93  78   BUN 6 - 20 mg/dL 13  10  11    Creatinine 0.44 - 1.00 mg/dL 9.43  9.47  9.49   Sodium 135 - 145 mmol/L 142  141  144   Potassium 3.5 - 5.1 mmol/L 3.3  3.4  3.4   Chloride 98 - 111 mmol/L 104  104  106   CO2 22 - 32 mmol/L 30  29  32   Calcium  8.9 - 10.3 mg/dL 9.8  9.4  8.6   Total Protein 6.5 - 8.1 g/dL 7.0  7.0  6.8   Total Bilirubin 0.0 - 1.2 mg/dL 0.8  0.5  0.5   Alkaline Phos 38 - 126 U/L 56  68  83   AST 15 - 41 U/L 9  11  8    ALT 0 - 44 U/L 11  13  11     02/05/2024  Bone Marrow Biopsy:       RADIOGRAPHIC STUDIES: I have personally reviewed the radiological images as listed and agreed with the findings in the report. No results found.   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 Patient has a globulin gap and Anemia and bone survey shows multiple bone lesions, Overall presentation is certainly concerning for multiple myeloma. Other paraneoplastic hypercalcemia is also possibility. 25-hydroxy vitamin D  levels are within normal limits at 66. Status post 1 dose of Zometa    #2  dehydration and volume contraction due to hypercalcemia.   #3  Hypertension #4 dyslipidemia #5 COPD #6 prediabetes #7 multiple electrolyte abnormalities including hypokalemia, hypophosphatemia and severe hypercalcemia. #8 multiple osseous lesions concerning for multiple myeloma.  Also noted to have left femoral intertrochanteric lesion. #9 history of T6 compression fracture was likely related to multiple myeloma.  Mild pain at this time.  PLAN:  -Discussed lab results on 04/27/24 in detail with patient. CBC showed WBC of 9.8K, hemoglobin of 11.8, and platelets of 250K. -CBC continues to improve -hgb nearly normal -CMP shows slightly low potassium. Kidney function and calcium  normal -her abnormal M protein has decreased to 0.6 g/dL from 3 g/dL -discussed goal to bring her monoclonal protein down as close to 0 as possible before 6 cycles of treatment -her K/L light chains have already normalized -lambda free light chains 6.5 -K/L ratio 0.77 -patient has been responding very well to treatment -she is appropriate to proceed with cycle 3 day 8 of treatment today -she will take her last  post-treatment dexamethasone  tomorrow -will continue to minimize pre-treatment steroids -continue to eat as well as she can -advised patient to limit her salt-intake -continue to stay well-hydrated with at least 2L of water daily.  -continue to stay physically active  FOLLOW-UP: Per integrated scheduling  The total time spent in the appointment was 30 minutes* .  All of the patient's questions were answered with apparent satisfaction. The patient knows to call the clinic with any problems, questions or concerns.   Emaline Saran MD MS AAHIVMS [facility] Hematology/Oncology Physician [facility]  .*Total Encounter Time as defined by the Centers for Medicare and Medicaid Services includes, in addition to the face-to-face time of a patient visit (documented in the note above) non-face-to-face time: obtaining and reviewing outside history, ordering and reviewing medications, tests or procedures, care coordination (communications with other health care professionals or caregivers) and documentation in the medical record.    I,Mitra Faeizi,acting as a Neurosurgeon for Emaline Saran, MD.,have documented all relevant documentation on the behalf of Emaline Saran, MD,as directed by  Emaline Saran, MD while in the presence of Emaline Saran, MD.  .I have reviewed the above documentation for accuracy and completeness, and I agree with the above. .Kathee Tumlin Kishore Naya Ilagan MD

## 2024-04-29 ENCOUNTER — Ambulatory Visit (INDEPENDENT_AMBULATORY_CARE_PROVIDER_SITE_OTHER): Admitting: Family Medicine

## 2024-04-29 ENCOUNTER — Encounter (INDEPENDENT_AMBULATORY_CARE_PROVIDER_SITE_OTHER): Payer: Self-pay | Admitting: Family Medicine

## 2024-04-29 ENCOUNTER — Other Ambulatory Visit (HOSPITAL_COMMUNITY): Payer: Self-pay

## 2024-04-29 VITALS — BP 115/75 | HR 72 | Temp 98.1°F | Ht 61.0 in | Wt 280.0 lb

## 2024-04-29 DIAGNOSIS — S22000D Wedge compression fracture of unspecified thoracic vertebra, subsequent encounter for fracture with routine healing: Secondary | ICD-10-CM | POA: Diagnosis not present

## 2024-04-29 DIAGNOSIS — E785 Hyperlipidemia, unspecified: Secondary | ICD-10-CM | POA: Diagnosis not present

## 2024-04-29 DIAGNOSIS — Z86718 Personal history of other venous thrombosis and embolism: Secondary | ICD-10-CM | POA: Diagnosis not present

## 2024-04-29 DIAGNOSIS — E669 Obesity, unspecified: Secondary | ICD-10-CM

## 2024-04-29 DIAGNOSIS — F419 Anxiety disorder, unspecified: Secondary | ICD-10-CM | POA: Diagnosis not present

## 2024-04-29 DIAGNOSIS — R7303 Prediabetes: Secondary | ICD-10-CM

## 2024-04-29 DIAGNOSIS — E876 Hypokalemia: Secondary | ICD-10-CM | POA: Diagnosis not present

## 2024-04-29 DIAGNOSIS — Z86711 Personal history of pulmonary embolism: Secondary | ICD-10-CM | POA: Diagnosis not present

## 2024-04-29 DIAGNOSIS — R632 Polyphagia: Secondary | ICD-10-CM | POA: Diagnosis not present

## 2024-04-29 DIAGNOSIS — Z7984 Long term (current) use of oral hypoglycemic drugs: Secondary | ICD-10-CM | POA: Diagnosis not present

## 2024-04-29 DIAGNOSIS — M1711 Unilateral primary osteoarthritis, right knee: Secondary | ICD-10-CM | POA: Diagnosis not present

## 2024-04-29 DIAGNOSIS — Z7982 Long term (current) use of aspirin: Secondary | ICD-10-CM | POA: Diagnosis not present

## 2024-04-29 DIAGNOSIS — C9000 Multiple myeloma not having achieved remission: Secondary | ICD-10-CM | POA: Diagnosis not present

## 2024-04-29 DIAGNOSIS — G8929 Other chronic pain: Secondary | ICD-10-CM | POA: Diagnosis not present

## 2024-04-29 DIAGNOSIS — Z8673 Personal history of transient ischemic attack (TIA), and cerebral infarction without residual deficits: Secondary | ICD-10-CM | POA: Diagnosis not present

## 2024-04-29 DIAGNOSIS — F319 Bipolar disorder, unspecified: Secondary | ICD-10-CM | POA: Diagnosis not present

## 2024-04-29 DIAGNOSIS — I10 Essential (primary) hypertension: Secondary | ICD-10-CM | POA: Diagnosis not present

## 2024-04-29 DIAGNOSIS — D509 Iron deficiency anemia, unspecified: Secondary | ICD-10-CM | POA: Diagnosis not present

## 2024-04-29 DIAGNOSIS — E860 Dehydration: Secondary | ICD-10-CM | POA: Diagnosis not present

## 2024-04-29 DIAGNOSIS — M25512 Pain in left shoulder: Secondary | ICD-10-CM | POA: Diagnosis not present

## 2024-04-29 DIAGNOSIS — J449 Chronic obstructive pulmonary disease, unspecified: Secondary | ICD-10-CM | POA: Diagnosis not present

## 2024-04-29 DIAGNOSIS — Z6841 Body Mass Index (BMI) 40.0 and over, adult: Secondary | ICD-10-CM | POA: Diagnosis not present

## 2024-04-29 DIAGNOSIS — M25511 Pain in right shoulder: Secondary | ICD-10-CM | POA: Diagnosis not present

## 2024-04-29 DIAGNOSIS — S72142D Displaced intertrochanteric fracture of left femur, subsequent encounter for closed fracture with routine healing: Secondary | ICD-10-CM | POA: Diagnosis not present

## 2024-04-29 MED ORDER — WEGOVY 0.5 MG/0.5ML ~~LOC~~ SOAJ
0.5000 mg | SUBCUTANEOUS | 0 refills | Status: DC
Start: 2024-04-29 — End: 2024-06-23
  Filled 2024-04-29 – 2024-05-13 (×5): qty 2, 28d supply, fill #0

## 2024-04-29 NOTE — Progress Notes (Signed)
 Carla Hanson, D.O.  ABFM, ABOM Specializing in Clinical Bariatric Medicine  Office located at: [address] [place], [place]  [address]   Assessment and Plan:  No orders of the defined types were placed in this encounter.  Medications Discontinued During This Encounter  Medication Reason   Semaglutide -Weight Management (WEGOVY ) 0.5 MG/0.5ML SOAJ Reorder    Meds ordered this encounter  Medications   Semaglutide -Weight Management (WEGOVY ) 0.5 MG/0.5ML SOAJ    Sig: Inject 0.5 mg into the skin once a week.    Dispense:  2 mL    Refill:  0      FOR THE DISEASE OF OBESITY: Obesity, Beginning BMI 57.4 BMI 50.0-59.9, adult -- Current BMI 52.93 Assessment & Plan: Since last office visit with Dr. Verdon on 03/24/24 patient's muscle mass Hanson increased by 19.6 lbs. Fat mass Hanson decreased by 21.6 lbs. Counseling done on how various foods will affect these numbers and how to maximize success. When she first joined the program, she weighed 324 lbs and her body fat percentage was 58.8%. Today, she is 280 lbs and her body fat percentage is 63.2 %  Total lbs lost to date: 44 lbs Total weight loss percentage to date: -13.58%    Recommended Dietary Goals Carla Hanson is currently in the action stage of change. As such, her goal is to continue weight management plan.  She Hanson agreed to: continue current plan   Behavioral Intervention We discussed the following today: increasing lean protein intake to established goals, increasing lower glycemic fruits, increasing water intake , and continue to work on maintaining a reduced calorie state, getting the recommended amount of protein, incorporating whole foods, making healthy choices, staying well hydrated and practicing mindfulness when eating.  Additional resources provided today: Handout on Examples of Low Glycemic Index and Low Calorie Fruits & Vegetables and Handout on the concepts of Adaptive Thermogenesis  Evidence-based interventions  for health behavior change were utilized today including the discussion of self monitoring techniques, problem-solving barriers and SMART goal setting techniques.   Regarding patient's less desirable eating habits and patterns, we employed the technique of small changes.   Pt will specifically work on: n/a   Recommended Physical Activity Goals Carla Hanson been advised to work up to 300-450 minutes of moderate intensity aerobic activity a week and strengthening exercises 2-3 times per week for cardiovascular health, weight loss maintenance and preservation of muscle mass.   She Hanson agreed to: Continue current level of physical activity    Pharmacotherapy We both agreed to: Continue with current nutritional and behavioral strategies and Continue Metformin  and Wegovy .    ASSOCIATED CONDITIONS ADDRESSED TODAY: Essential hypertension Assessment & Plan: BP Readings from Last 6 Encounters:  04/29/24 115/75  04/27/24 138/76  04/21/24 134/67   BP Hanson overall improved. Per last obtained labs obtained by PCP, which pt requested we review with her today, her sodium levels and ALT were both slightly elevated. Currently on Losartan  50 mg once daily and Lasix  40 mg once daily. Tolerating well with no SE. Stopped taking HCTZ about 1 month ago. Work on following a heart-healthy diet per her nutritional meal plan. Limit consumption of foods high in sodium and limit eating outside the home. Continue with antihypertensive regimen as prescribed. Will continue monitoring alongside PCP/specialists.     Pre-diabetes Polyphagia Assessment & Plan: Lab Results  Component Value Date   HGBA1C 6.3 (H) 01/29/2024   HGBA1C 6.2 (H) 07/29/2023   HGBA1C 6.3 (H) 03/25/2023  INSULIN  35.5 (H) 01/29/2024   INSULIN  36.7 (H) 07/29/2023   INSULIN  46.3 (H) 03/25/2023    Her sugars tend to increase after eating. Pt reports last check sugars was 88. A1c Hanson increased over the course of several years. Hunger and cravings  are uncontrolled. She is complaint with Metformin  500 mg once daily and Wegovy  0.5 mg once weekly. Pt will be taking her second dose of Wegovy  this week and will required a refill today. Reviewed risks/benefits of Wegovy  and stressed the importance of eating on plan to avoid any potential SE including GI upset. Work on following her prudent nutritional meal plan. Continue with Metformin  and Wegovy  as prescribed, will refill Wegovy  today.     Follow up:   Return in about 3 weeks (around 05/20/2024), or 3 weeks. She was informed of the importance of frequent follow up visits to maximize her success with intensive lifestyle modifications for her multiple health conditions.  Subjective:   Chief complaint: Obesity Carla Hanson is here to discuss her progress with her obesity treatment plan. She is on the Category 2 Plan and states she is following her eating plan approximately 80% of the time. She states she is walking and weight lifting 30 minutes 3 days per week.  Interval History:  Carla Hanson is here for a follow up office visit. Patient is established with Dr. Verdon. Since last OV with Dr. Verdon on 5/21, she Hanson lost 5 lbs. She reports she Hanson recently been sick and struggled with losing weight in the past 1-2 months as a result. Recently dx with multiple myeloma and is currently working on eating more fruits, vegetables, and other whole foods. Her sleep quality Hanson improved.   Pharmacotherapy for weight loss: She is currently taking Metformin  500 mg once daily and Wegovy  0.5 mg once weekly.    Review of Systems:  Pertinent positives were addressed with patient today.  Reviewed by clinician on day of visit: allergies, medications, problem list, medical history, surgical history, family history, social history, and previous encounter notes.  Weight Summary and Biometrics   Weight Lost Since Last Visit: 5lb  Weight Gained Since Last Visit: 0lb    Vitals Temp: 98.1 F (36.7 C) BP:  115/75 Pulse Rate: 72 SpO2: 98 %   Anthropometric Measurements Height: 5' 1 (1.549 m) Weight: 280 lb (127 kg) BMI (Calculated): 52.93 Weight at Last Visit: 281lb Weight Lost Since Last Visit: 5lb Weight Gained Since Last Visit: 0lb Starting Weight: 324lb Total Weight Loss (lbs): 44 lb (20 kg) Peak Weight: 335lb   Body Composition  Body Fat %: 63.2 % Fat Mass (lbs): 177 lbs Muscle Mass (lbs): 97.8 lbs Visceral Fat Rating : 25   Other Clinical Data Fasting: No Labs: no Today's Visit #: 58 Starting Date: 01/03/21    Objective:   PHYSICAL EXAM: Blood pressure 115/75, pulse 72, temperature 98.1 F (36.7 C), height 5' 1 (1.549 m), weight 280 lb (127 kg), SpO2 98%. Body mass index is 52.91 kg/m.  General: she is overweight, cooperative and in no acute distress. PSYCH: Hanson normal mood, affect and thought process.   HEENT: EOMI, sclerae are anicteric. Lungs: Normal breathing effort, no conversational dyspnea. Extremities: Moves * 4 Neurologic: A and O * 3, good insight  DIAGNOSTIC DATA REVIEWED: BMET    Component Value Date/Time   NA 142 04/27/2024 1213   NA 145 (H) 02/24/2024 1217   K 3.3 (L) 04/27/2024 1213   CL 104 04/27/2024 1213   CO2 30 04/27/2024  1213   GLUCOSE 88 04/27/2024 1213   BUN 13 04/27/2024 1213   BUN 16 02/24/2024 1217   CREATININE 0.56 04/27/2024 1213   CREATININE 0.57 11/09/2014 1032   CALCIUM  9.8 04/27/2024 1213   GFRNONAA >60 04/27/2024 1213   GFRNONAA >89 12/29/2013 0942   GFRAA 127 12/08/2019 1210   GFRAA >89 12/29/2013 0942   Lab Results  Component Value Date   HGBA1C 6.3 (H) 01/29/2024   HGBA1C 6.0 12/31/2012   Lab Results  Component Value Date   INSULIN  35.5 (H) 01/29/2024   INSULIN  77.3 (H) 01/03/2021   Lab Results  Component Value Date   TSH 0.736 01/29/2024   CBC    Component Value Date/Time   WBC 9.8 04/27/2024 1213   WBC 9.0 02/05/2024 0501   RBC 4.38 04/27/2024 1213   HGB 11.8 (L) 04/27/2024 1213   HGB  11.3 02/10/2024 1201   HCT 36.5 04/27/2024 1213   HCT 34.7 02/10/2024 1201   PLT 250 04/27/2024 1213   PLT 342 02/10/2024 1201   MCV 83.3 04/27/2024 1213   MCV 84 02/10/2024 1201   MCH 26.9 04/27/2024 1213   MCHC 32.3 04/27/2024 1213   RDW 16.8 (H) 04/27/2024 1213   RDW 15.0 02/10/2024 1201   Iron Studies    Component Value Date/Time   IRON 27 02/10/2024 1201   TIBC 245 (L) 02/10/2024 1201   FERRITIN 384 (H) 02/10/2024 1201   IRONPCTSAT 11 (L) 02/10/2024 1201   Lipid Panel     Component Value Date/Time   CHOL 143 02/10/2024 1201   TRIG 113 02/10/2024 1201   HDL 45 02/10/2024 1201   CHOLHDL 3.2 02/10/2024 1201   CHOLHDL 6.3 (H) 01/08/2017 0947   VLDL 40 (H) 01/08/2017 0947   LDLCALC 77 02/10/2024 1201   Hepatic Function Panel     Component Value Date/Time   PROT 7.0 04/27/2024 1213   PROT 8.2 02/24/2024 1217   ALBUMIN 4.3 04/27/2024 1213   ALBUMIN 3.6 (L) 02/24/2024 1217   AST 9 (L) 04/27/2024 1213   ALT 11 04/27/2024 1213   ALKPHOS 56 04/27/2024 1213   BILITOT 0.8 04/27/2024 1213      Component Value Date/Time   TSH 0.736 01/29/2024 1553   Nutritional Lab Results  Component Value Date   VD25OH 66.9 01/29/2024   VD25OH 70.4 07/29/2023   VD25OH 63.8 03/25/2023    Attestations:   I, Vernell Forest, acting as a Stage manager for Carla Jenkins, DO., have compiled all relevant documentation for today's office visit on behalf of Carla Jenkins, DO, while in the presence of [employer], DO.   I have reviewed the above documentation for accuracy and completeness, and I agree with the above. Carla JINNY Hanson, D.O.  The 21st Century Cures Act was signed into law in 2016 which includes the topic of electronic health records.  This provides immediate access to information in MyChart.  This includes consultation notes, operative notes, office notes, lab results and pathology reports.  If you have any questions about what you read please let us  know at your next  visit so we can discuss your concerns and take corrective action if need be.  We are right here with you.

## 2024-05-02 ENCOUNTER — Other Ambulatory Visit: Payer: Self-pay | Admitting: Family Medicine

## 2024-05-03 ENCOUNTER — Other Ambulatory Visit: Payer: Self-pay

## 2024-05-03 ENCOUNTER — Other Ambulatory Visit (HOSPITAL_COMMUNITY): Payer: Self-pay

## 2024-05-03 MED ORDER — FUROSEMIDE 40 MG PO TABS
40.0000 mg | ORAL_TABLET | Freq: Every day | ORAL | 1 refills | Status: DC
  Filled 2024-05-03: qty 90, 90d supply, fill #0
  Filled 2024-05-26 – 2024-07-21 (×3): qty 90, 90d supply, fill #1

## 2024-05-04 ENCOUNTER — Other Ambulatory Visit (HOSPITAL_COMMUNITY): Payer: Self-pay

## 2024-05-04 ENCOUNTER — Other Ambulatory Visit: Payer: Self-pay

## 2024-05-04 ENCOUNTER — Encounter: Payer: Self-pay | Admitting: Hematology

## 2024-05-05 ENCOUNTER — Inpatient Hospital Stay: Admitting: Hematology

## 2024-05-05 ENCOUNTER — Encounter: Payer: Self-pay | Admitting: Hematology

## 2024-05-05 ENCOUNTER — Other Ambulatory Visit (HOSPITAL_COMMUNITY): Payer: Self-pay

## 2024-05-05 ENCOUNTER — Inpatient Hospital Stay: Attending: Hematology

## 2024-05-05 ENCOUNTER — Inpatient Hospital Stay

## 2024-05-05 VITALS — BP 163/67 | HR 96 | Temp 98.2°F | Resp 18 | Wt 278.8 lb

## 2024-05-05 DIAGNOSIS — Z5111 Encounter for antineoplastic chemotherapy: Secondary | ICD-10-CM | POA: Diagnosis present

## 2024-05-05 DIAGNOSIS — Z5112 Encounter for antineoplastic immunotherapy: Secondary | ICD-10-CM | POA: Diagnosis not present

## 2024-05-05 DIAGNOSIS — Z87891 Personal history of nicotine dependence: Secondary | ICD-10-CM | POA: Insufficient documentation

## 2024-05-05 DIAGNOSIS — C9000 Multiple myeloma not having achieved remission: Secondary | ICD-10-CM | POA: Insufficient documentation

## 2024-05-05 LAB — CBC WITH DIFFERENTIAL (CANCER CENTER ONLY)
Abs Immature Granulocytes: 0.02 10*3/uL (ref 0.00–0.07)
Basophils Absolute: 0 10*3/uL (ref 0.0–0.1)
Basophils Relative: 1 %
Eosinophils Absolute: 0.1 10*3/uL (ref 0.0–0.5)
Eosinophils Relative: 1 %
HCT: 37.6 % (ref 36.0–46.0)
Hemoglobin: 12.2 g/dL (ref 12.0–15.0)
Immature Granulocytes: 0 %
Lymphocytes Relative: 18 %
Lymphs Abs: 1.5 10*3/uL (ref 0.7–4.0)
MCH: 27.4 pg (ref 26.0–34.0)
MCHC: 32.4 g/dL (ref 30.0–36.0)
MCV: 84.3 fL (ref 80.0–100.0)
Monocytes Absolute: 0.6 10*3/uL (ref 0.1–1.0)
Monocytes Relative: 8 %
Neutro Abs: 6.2 10*3/uL (ref 1.7–7.7)
Neutrophils Relative %: 72 %
Platelet Count: 245 10*3/uL (ref 150–400)
RBC: 4.46 MIL/uL (ref 3.87–5.11)
RDW: 16.5 % — ABNORMAL HIGH (ref 11.5–15.5)
WBC Count: 8.6 10*3/uL (ref 4.0–10.5)
nRBC: 0 % (ref 0.0–0.2)

## 2024-05-05 MED ORDER — DEXAMETHASONE 4 MG PO TABS
20.0000 mg | ORAL_TABLET | Freq: Once | ORAL | Status: AC
  Administered 2024-05-05: 20 mg via ORAL
  Filled 2024-05-05: qty 5

## 2024-05-05 MED ORDER — ACETAMINOPHEN 325 MG PO TABS
650.0000 mg | ORAL_TABLET | Freq: Once | ORAL | Status: AC
  Administered 2024-05-05: 650 mg via ORAL
  Filled 2024-05-05: qty 2

## 2024-05-05 MED ORDER — DARATUMUMAB-HYALURONIDASE-FIHJ 1800-30000 MG-UT/15ML ~~LOC~~ SOLN
1800.0000 mg | Freq: Once | SUBCUTANEOUS | Status: AC
  Administered 2024-05-05: 1800 mg via SUBCUTANEOUS
  Filled 2024-05-05: qty 15

## 2024-05-05 MED ORDER — FAMOTIDINE 20 MG PO TABS
20.0000 mg | ORAL_TABLET | Freq: Once | ORAL | Status: AC
  Administered 2024-05-05: 20 mg via ORAL
  Filled 2024-05-05: qty 1

## 2024-05-05 MED ORDER — DIPHENHYDRAMINE HCL 25 MG PO CAPS
50.0000 mg | ORAL_CAPSULE | Freq: Once | ORAL | Status: AC
  Administered 2024-05-05: 50 mg via ORAL
  Filled 2024-05-05: qty 2

## 2024-05-05 MED ORDER — BORTEZOMIB CHEMO SQ INJECTION 3.5 MG (2.5MG/ML)
1.5000 mg/m2 | Freq: Once | INTRAMUSCULAR | Status: AC
  Administered 2024-05-05: 3.75 mg via SUBCUTANEOUS
  Filled 2024-05-05: qty 1.5

## 2024-05-05 NOTE — Patient Instructions (Signed)
 [facility] - A DEPT OF [facility]  Discharge Instructions: Thank you for choosing [facility] to provide your oncology and hematology care.   If you have a lab appointment with the Cancer Center, please go directly to the Cancer Center and check in at the registration area.   Wear comfortable clothing and clothing appropriate for easy access to any Portacath or PICC line.   We strive to give you quality time with your provider. You may need to reschedule your appointment if you arrive late (15 or more minutes).  Arriving late affects you and other patients whose appointments are after yours.  Also, if you miss three or more appointments without notifying the office, you may be dismissed from the clinic at the provider's discretion.      For prescription refill requests, have your pharmacy contact our office and allow 72 hours for refills to be completed.    Today you received the following chemotherapy and/or immunotherapy agents: Velcade, Dara Faspro.       To help prevent nausea and vomiting after your treatment, we encourage you to take your nausea medication as directed.  BELOW ARE SYMPTOMS THAT SHOULD BE REPORTED IMMEDIATELY: *FEVER GREATER THAN 100.4 F (38 C) OR HIGHER *CHILLS OR SWEATING *NAUSEA AND VOMITING THAT IS NOT CONTROLLED WITH YOUR NAUSEA MEDICATION *UNUSUAL SHORTNESS OF BREATH *UNUSUAL BRUISING OR BLEEDING *URINARY PROBLEMS (pain or burning when urinating, or frequent urination) *BOWEL PROBLEMS (unusual diarrhea, constipation, pain near the anus) TENDERNESS IN MOUTH AND THROAT WITH OR WITHOUT PRESENCE OF ULCERS (sore throat, sores in mouth, or a toothache) UNUSUAL RASH, SWELLING OR PAIN  UNUSUAL VAGINAL DISCHARGE OR ITCHING   Items with * indicate a potential emergency and should be followed up as soon as possible or go to the Emergency Department if any problems should occur.  Please show the CHEMOTHERAPY ALERT CARD or  IMMUNOTHERAPY ALERT CARD at check-in to the Emergency Department and triage nurse.  Should you have questions after your visit or need to cancel or reschedule your appointment, please contact [facility] - A DEPT OF [facility]  Dept: [phone]  and follow the prompts.  Office hours are 8:00 a.m. to 4:30 p.m. Monday - Friday. Please note that voicemails left after 4:00 p.m. may not be returned until the following business day.  We are closed weekends and major holidays. You have access to a nurse at all times for urgent questions. Please call the main number to the clinic Dept: [phone] and follow the prompts.   For any non-urgent questions, you may also contact your provider using MyChart. We now offer e-Visits for anyone 75 and older to request care online for non-urgent symptoms. For details visit [email or web address].   Also download the MyChart app! Go to the app store, search "MyChart", open the app, select , and log in with your MyChart username and password.

## 2024-05-05 NOTE — Progress Notes (Signed)
 Per Onesimo MD, ok to proceed with tx today with CMP results from 6/24

## 2024-05-05 NOTE — Progress Notes (Signed)
 Ok to proceed with cmet pending per Dr Salomon Cree

## 2024-05-06 DIAGNOSIS — F319 Bipolar disorder, unspecified: Secondary | ICD-10-CM | POA: Diagnosis not present

## 2024-05-06 DIAGNOSIS — D509 Iron deficiency anemia, unspecified: Secondary | ICD-10-CM | POA: Diagnosis not present

## 2024-05-06 DIAGNOSIS — S22000D Wedge compression fracture of unspecified thoracic vertebra, subsequent encounter for fracture with routine healing: Secondary | ICD-10-CM | POA: Diagnosis not present

## 2024-05-06 DIAGNOSIS — Z7982 Long term (current) use of aspirin: Secondary | ICD-10-CM | POA: Diagnosis not present

## 2024-05-06 DIAGNOSIS — M1711 Unilateral primary osteoarthritis, right knee: Secondary | ICD-10-CM | POA: Diagnosis not present

## 2024-05-06 DIAGNOSIS — Z86711 Personal history of pulmonary embolism: Secondary | ICD-10-CM | POA: Diagnosis not present

## 2024-05-06 DIAGNOSIS — E876 Hypokalemia: Secondary | ICD-10-CM | POA: Diagnosis not present

## 2024-05-06 DIAGNOSIS — R7303 Prediabetes: Secondary | ICD-10-CM | POA: Diagnosis not present

## 2024-05-06 DIAGNOSIS — G8929 Other chronic pain: Secondary | ICD-10-CM | POA: Diagnosis not present

## 2024-05-06 DIAGNOSIS — I10 Essential (primary) hypertension: Secondary | ICD-10-CM | POA: Diagnosis not present

## 2024-05-06 DIAGNOSIS — Z8673 Personal history of transient ischemic attack (TIA), and cerebral infarction without residual deficits: Secondary | ICD-10-CM | POA: Diagnosis not present

## 2024-05-06 DIAGNOSIS — F419 Anxiety disorder, unspecified: Secondary | ICD-10-CM | POA: Diagnosis not present

## 2024-05-06 DIAGNOSIS — E785 Hyperlipidemia, unspecified: Secondary | ICD-10-CM | POA: Diagnosis not present

## 2024-05-06 DIAGNOSIS — Z6841 Body Mass Index (BMI) 40.0 and over, adult: Secondary | ICD-10-CM | POA: Diagnosis not present

## 2024-05-06 DIAGNOSIS — J449 Chronic obstructive pulmonary disease, unspecified: Secondary | ICD-10-CM | POA: Diagnosis not present

## 2024-05-06 DIAGNOSIS — M25511 Pain in right shoulder: Secondary | ICD-10-CM | POA: Diagnosis not present

## 2024-05-06 DIAGNOSIS — M25512 Pain in left shoulder: Secondary | ICD-10-CM | POA: Diagnosis not present

## 2024-05-06 DIAGNOSIS — C9000 Multiple myeloma not having achieved remission: Secondary | ICD-10-CM | POA: Diagnosis not present

## 2024-05-06 DIAGNOSIS — E860 Dehydration: Secondary | ICD-10-CM | POA: Diagnosis not present

## 2024-05-06 DIAGNOSIS — S72142D Displaced intertrochanteric fracture of left femur, subsequent encounter for closed fracture with routine healing: Secondary | ICD-10-CM | POA: Diagnosis not present

## 2024-05-06 DIAGNOSIS — Z86718 Personal history of other venous thrombosis and embolism: Secondary | ICD-10-CM | POA: Diagnosis not present

## 2024-05-06 DIAGNOSIS — Z7984 Long term (current) use of oral hypoglycemic drugs: Secondary | ICD-10-CM | POA: Diagnosis not present

## 2024-05-08 ENCOUNTER — Other Ambulatory Visit (HOSPITAL_COMMUNITY): Payer: Self-pay

## 2024-05-10 ENCOUNTER — Other Ambulatory Visit (HOSPITAL_COMMUNITY): Payer: Self-pay

## 2024-05-10 ENCOUNTER — Other Ambulatory Visit: Payer: Self-pay | Admitting: Physician Assistant

## 2024-05-11 ENCOUNTER — Inpatient Hospital Stay

## 2024-05-11 ENCOUNTER — Other Ambulatory Visit: Payer: Self-pay | Admitting: Hematology and Oncology

## 2024-05-11 ENCOUNTER — Inpatient Hospital Stay (HOSPITAL_BASED_OUTPATIENT_CLINIC_OR_DEPARTMENT_OTHER): Admitting: Physician Assistant

## 2024-05-11 VITALS — BP 146/86 | HR 80 | Temp 97.7°F | Resp 20 | Wt 279.1 lb

## 2024-05-11 DIAGNOSIS — Z5111 Encounter for antineoplastic chemotherapy: Secondary | ICD-10-CM

## 2024-05-11 DIAGNOSIS — C9000 Multiple myeloma not having achieved remission: Secondary | ICD-10-CM

## 2024-05-11 LAB — CBC WITH DIFFERENTIAL (CANCER CENTER ONLY)
Abs Immature Granulocytes: 0.02 K/uL (ref 0.00–0.07)
Basophils Absolute: 0 K/uL (ref 0.0–0.1)
Basophils Relative: 0 %
Eosinophils Absolute: 0.1 K/uL (ref 0.0–0.5)
Eosinophils Relative: 1 %
HCT: 35.4 % — ABNORMAL LOW (ref 36.0–46.0)
Hemoglobin: 11.4 g/dL — ABNORMAL LOW (ref 12.0–15.0)
Immature Granulocytes: 0 %
Lymphocytes Relative: 18 %
Lymphs Abs: 1.4 K/uL (ref 0.7–4.0)
MCH: 27.1 pg (ref 26.0–34.0)
MCHC: 32.2 g/dL (ref 30.0–36.0)
MCV: 84.3 fL (ref 80.0–100.0)
Monocytes Absolute: 0.5 K/uL (ref 0.1–1.0)
Monocytes Relative: 6 %
Neutro Abs: 5.6 K/uL (ref 1.7–7.7)
Neutrophils Relative %: 75 %
Platelet Count: 234 K/uL (ref 150–400)
RBC: 4.2 MIL/uL (ref 3.87–5.11)
RDW: 16 % — ABNORMAL HIGH (ref 11.5–15.5)
WBC Count: 7.5 K/uL (ref 4.0–10.5)
nRBC: 0 % (ref 0.0–0.2)

## 2024-05-11 LAB — CMP (CANCER CENTER ONLY)
ALT: 10 U/L (ref 0–44)
AST: 9 U/L — ABNORMAL LOW (ref 15–41)
Albumin: 4.1 g/dL (ref 3.5–5.0)
Alkaline Phosphatase: 49 U/L (ref 38–126)
Anion gap: 8 (ref 5–15)
BUN: 12 mg/dL (ref 6–20)
CO2: 29 mmol/L (ref 22–32)
Calcium: 9.4 mg/dL (ref 8.9–10.3)
Chloride: 105 mmol/L (ref 98–111)
Creatinine: 0.51 mg/dL (ref 0.44–1.00)
GFR, Estimated: >60 mL/min (ref >60)
Glucose, Bld: 87 mg/dL (ref 70–99)
Potassium: 3.4 mmol/L — ABNORMAL LOW (ref 3.5–5.1)
Sodium: 142 mmol/L (ref 135–145)
Total Bilirubin: 0.6 mg/dL (ref 0.0–1.2)
Total Protein: 6.5 g/dL (ref 6.5–8.1)

## 2024-05-11 MED ORDER — ZOLEDRONIC ACID 4 MG/100ML IV SOLN
4.0000 mg | Freq: Once | INTRAVENOUS | Status: AC
  Administered 2024-05-11: 4 mg via INTRAVENOUS
  Filled 2024-05-11: qty 100

## 2024-05-11 MED ORDER — DEXAMETHASONE 4 MG PO TABS
20.0000 mg | ORAL_TABLET | Freq: Once | ORAL | Status: AC
  Administered 2024-05-11: 20 mg via ORAL
  Filled 2024-05-11: qty 5

## 2024-05-11 MED ORDER — BORTEZOMIB CHEMO SQ INJECTION 3.5 MG (2.5MG/ML)
1.5000 mg/m2 | Freq: Once | INTRAMUSCULAR | Status: AC
  Administered 2024-05-11: 3.75 mg via SUBCUTANEOUS
  Filled 2024-05-11: qty 1.5

## 2024-05-11 MED ORDER — ACETAMINOPHEN 325 MG PO TABS
650.0000 mg | ORAL_TABLET | Freq: Once | ORAL | Status: AC
  Administered 2024-05-11: 650 mg via ORAL
  Filled 2024-05-11: qty 2

## 2024-05-11 MED ORDER — DARATUMUMAB-HYALURONIDASE-FIHJ 1800-30000 MG-UT/15ML ~~LOC~~ SOLN
1800.0000 mg | Freq: Once | SUBCUTANEOUS | Status: AC
  Administered 2024-05-11: 1800 mg via SUBCUTANEOUS
  Filled 2024-05-11: qty 15

## 2024-05-11 MED ORDER — FAMOTIDINE 20 MG PO TABS
20.0000 mg | ORAL_TABLET | Freq: Once | ORAL | Status: AC
  Administered 2024-05-11: 20 mg via ORAL
  Filled 2024-05-11: qty 1

## 2024-05-11 MED ORDER — DIPHENHYDRAMINE HCL 25 MG PO CAPS
50.0000 mg | ORAL_CAPSULE | Freq: Once | ORAL | Status: AC
  Administered 2024-05-11: 50 mg via ORAL
  Filled 2024-05-11: qty 2

## 2024-05-11 NOTE — Progress Notes (Signed)
 HEMATOLOGY/ONCOLOGY CLINIC NOTE  Date of Service: 05/11/24  Patient Care Team: Anders Otto DASEN, MD as PCP - General (Family Medicine) Rosemarie [name] RAMAN, MD as Consulting Physician (Neurology) Wonda Cy BROCKS, RD as Dietitian (Family Medicine) Onesimo Emaline Brink, MD as Consulting Physician (Hematology)  CHIEF COMPLAINTS/PURPOSE OF CONSULTATION:  F/u for evaluation and mx of Multiple myeloma  INTERVAL HISTORY:  Carla Hanson is a 58 y.o. female here for evaluation and management of multiple myeloma. He was last seen by Dr. Onesimo on 04/27/2024. In the interim, she denies any changes to her health.   Carla Hanson reports she is tolerating treatment without any new or concerning symptoms. She is working on improving her strength and stability with physical therapy. She shares that she ambulates at home without assistance and plans to start coming to the cancer center with a walking cane instead of walker.   Her appetite and energy levels are stable. She reports intermittent, mild nausea that resolved with one dose of antiemetics. She denies any vomiting episodes or abdominal pain.  She has occasional shortness of breath with exertion that is chronic in nature. She reports improvement of bilateral lower extremity edema with elevation. She denies fevers, chills, sweats, chest pain, cough, headaches, dizziness. She has no other complaints.   MEDICAL HISTORY:  Past Medical History:  Diagnosis Date   Alcohol abuse    Anxiety    Back pain    Bipolar disorder (HCC)    Bronchitis    COPD exacerbation (HCC) 05/10/2016   Drug use    Edema 02/24/2024   History of blood clots    Hyperlipidemia    Hypertension    Influenza A 12/01/2018   Joint pain    Left leg DVT (HCC) 09/29/2013   Lipoma    Abdomen   LIPOMA 01/20/2008   Obesity    Occasional tremors 05/12/2019   Osteoarthritis of right knee    Other fatigue    Painful menstrual periods 01/05/2012   Pneumonia    Prediabetes     Seizure (HCC)    Shortness of breath on exertion    Stroke (HCC)    Tobacco abuse     SURGICAL HISTORY: Past Surgical History:  Procedure Laterality Date   CESAREAN SECTION     CYSTECTOMY     LIPOMA EXCISION  03/2011   ORIF ANKLE FRACTURE  03/13/2012   Procedure: OPEN REDUCTION INTERNAL FIXATION (ORIF) ANKLE FRACTURE;  Surgeon: Lynwood FORBES Better, MD;  Location: [facility];  Service: Orthopedics;  Laterality: Right;   TUBAL LIGATION      SOCIAL HISTORY: Social History   Socioeconomic History   Marital status: Divorced    Spouse name: Not on file   Number of children: 4   Years of education: 10   Highest education level: Not on file  Occupational History   Occupation: stay at home  Tobacco Use   Smoking status: Former    Current packs/day: 0.00    Average packs/day: 0.5 packs/day for 35.0 years (17.5 ttl pk-yrs)    Types: Cigarettes    Start date: 11/04/1981    Quit date: 10/2016    Years since quitting: 7.6   Smokeless tobacco: Never  Vaping Use   Vaping status: Never Used  Substance and Sexual Activity   Alcohol use: No    Alcohol/week: 0.0 standard drinks of alcohol    Comment: Quit 10/2016   Drug use: Yes    Types: Marijuana   Sexual activity: Not Currently  Comment: tubal  Other Topics Concern   Not on file  Social History Narrative   Unemployed, 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  Considering nursing.    Lives at home alone   Right-handed   Caffeine: not much   Social Drivers of Corporate investment banker Strain: Low Risk  (11/25/2023)   Overall Financial Resource Strain (CARDIA)    Difficulty of Paying Living Expenses: Not hard at all  Food Insecurity: No Food Insecurity (02/06/2024)   Hunger Vital Sign    Worried About Running Out of Food in the Last Year: Never true    Ran Out of Food in the Last Year: Never true  Transportation Needs: No Transportation Needs (02/06/2024)    PRAPARE - Administrator, Civil Service (Medical): No    Lack of Transportation (Non-Medical): No  Physical Activity: Sufficiently Active (11/25/2023)   Exercise Vital Sign    Days of Exercise per Week: 5 days    Minutes of Exercise per Session: 60 min  Stress: No Stress Concern Present (11/25/2023)   [employer] of Occupational Health - Occupational Stress Questionnaire    Feeling of Stress : Not at all  Social Connections: Moderately Integrated (01/31/2024)   Social Connection and Isolation Panel    Frequency of Communication with Friends and Family: Three times a week    Frequency of Social Gatherings with Friends and Family: Once a week    Attends Religious Services: More than 4 times per year    Active Member of [employer] or Organizations: Yes    Attends Banker Meetings: Patient declined    Marital Status: Divorced  Catering manager Violence: Not At Risk (02/06/2024)   Humiliation, Afraid, Rape, and Kick questionnaire    Fear of Current or Ex-Partner: No    Emotionally Abused: No    Physically Abused: No    Sexually Abused: No    FAMILY HISTORY: Family History  Problem Relation Age of Onset   Other Mother        High blood pressure runs in the family   Obesity Mother    Asthma Father    Heart attack Father    Obesity Father    Diabetes Maternal Aunt    Breast cancer Neg Hx     ALLERGIES:  is allergic to latuda  [lurasidone ].  MEDICATIONS:  Current Outpatient Medications  Medication Sig Dispense Refill   acyclovir  (ZOVIRAX ) 400 MG tablet Take 1 tablet (400 mg total) by mouth 2 (two) times daily. 60 tablet 5   albuterol  (VENTOLIN  HFA) 108 (90 Base) MCG/ACT inhaler Inhale 2 puffs into the lungs every 6 (six) hours as needed for wheezing or shortness of breath. 18 g 2   aspirin  EC 81 MG EC tablet Take 1 tablet (81 mg total) by mouth daily. 30 tablet 12   cetirizine  (ZYRTEC ) 10 MG tablet Take 1 tablet (10 mg total) by mouth daily. 90 tablet 1    ferrous sulfate  325 (65 FE) MG tablet Take 325 mg by mouth daily with breakfast.     furosemide  (LASIX ) 40 MG tablet Take 1 tablet (40 mg total) by mouth daily. 90 tablet 1   losartan  (COZAAR ) 50 MG tablet Take 1 tablet (50 mg total) by mouth daily. 30 tablet 1   metFORMIN  (GLUCOPHAGE ) 500 MG tablet Take 1 tablet (500 mg total) by mouth daily with breakfast.     potassium chloride  SA (  KLOR-CON  M) 20 MEQ tablet Take 1 tablet (20 mEq total) by mouth daily. 30 tablet 2   rosuvastatin  (CRESTOR ) 20 MG tablet TAKE 1 TABLET(20 MG) BY MOUTH DAILY 90 tablet 1   Semaglutide -Weight Management (WEGOVY ) 0.5 MG/0.5ML SOAJ Inject 0.5 mg into the skin once a week. 2 mL 0   acetaminophen  (TYLENOL ) 325 MG tablet Take 650 mg by mouth every 6 (six) hours as needed for mild pain (pain score 1-3). (Patient not taking: Reported on 05/11/2024)     acyclovir  (ZOVIRAX ) 400 MG tablet Take 1 tablet (400 mg total) by mouth 2 (two) times daily. (Patient not taking: Reported on 05/11/2024) 60 tablet 5   albuterol  (PROVENTIL ) (2.5 MG/3ML) 0.083% nebulizer solution Take 3 mLs (2.5 mg total) by nebulization every 6 (six) hours as needed for wheezing or shortness of breath. (Patient not taking: Reported on 05/11/2024) 75 mL 2   ondansetron  (ZOFRAN ) 8 MG tablet Take 1 tablet (8 mg total) by mouth every 8 (eight) hours as needed for nausea or vomiting. (Patient not taking: Reported on 05/11/2024) 30 tablet 1   prochlorperazine  (COMPAZINE ) 10 MG tablet Take 1 tablet (10 mg total) by mouth every 6 (six) hours as needed for nausea or vomiting (Patient not taking: Reported on 05/11/2024) 30 tablet 1   tiZANidine  (ZANAFLEX ) 4 MG tablet Take 1 tablet (4 mg total) by mouth 2 (two) times daily as needed for muscle spasms. (Patient not taking: Reported on 05/11/2024) 60 tablet 1   No current facility-administered medications for this visit.   Facility-Administered Medications Ordered in Other Visits  Medication Dose Route Frequency Provider Last Rate Last  Admin   bortezomib  SQ (VELCADE ) chemo injection (2.5mg /mL concentration) 3.75 mg  1.5 mg/m2 (Treatment Plan Recorded) Subcutaneous Once Andjela Wickes T, PA-C       daratumumab -hyaluronidase -fihj (DARZALEX  FASPRO) 1800-30000 MG-UT/15ML chemo SQ injection 1,800 mg  1,800 mg Subcutaneous Once Veida Spira T, PA-C       Zoledronic  Acid (ZOMETA ) IVPB 4 mg  4 mg Intravenous Once Kale, Gautam Kishore, MD        REVIEW OF SYSTEMS:    10 Point review of Systems was done is negative except as noted above.   PHYSICAL EXAMINATION: ECOG PERFORMANCE STATUS: 2  . Vitals:   05/11/24 0855  BP: (!) 146/86  Pulse: 80  Resp: 20  Temp: 97.7 F (36.5 C)  SpO2: 96%   Filed Weights   05/11/24 0855  Weight: 279 lb 1.6 oz (126.6 kg)  .Body mass index is 52.74 kg/m.   GENERAL:alert, in no acute distress and comfortable SKIN: no acute rashes, no significant lesions EYES: conjunctiva are pink and non-injected, sclera anicteric LUNGS: clear to auscultation b/l with normal respiratory effort HEART: regular rate & rhythm Extremity: bilateral trace pedal edema PSYCH: alert & oriented x 3 with fluent speech NEURO: no focal motor/sensory deficits   LABORATORY DATA:  I have reviewed the data as listed  .    Latest Ref Rng & Units 05/11/2024    8:09 AM 05/05/2024   10:32 AM 04/27/2024   12:13 PM  CBC  WBC 4.0 - 10.5 K/uL 7.5  8.6  9.8   Hemoglobin 12.0 - 15.0 g/dL 88.5  87.7  88.1   Hematocrit 36.0 - 46.0 % 35.4  37.6  36.5   Platelets 150 - 400 K/uL 234  245  250     .    Latest Ref Rng & Units 05/11/2024    8:09 AM 04/27/2024   12:13  PM 04/21/2024    1:17 PM  CMP  Glucose 70 - 99 mg/dL 87  88  93   BUN 6 - 20 mg/dL 12  13  10    Creatinine 0.44 - 1.00 mg/dL 9.48  9.43  9.47   Sodium 135 - 145 mmol/L 142  142  141   Potassium 3.5 - 5.1 mmol/L 3.4  3.3  3.4   Chloride 98 - 111 mmol/L 105  104  104   CO2 22 - 32 mmol/L 29  30  29    Calcium  8.9 - 10.3 mg/dL 9.4  9.8  9.4   Total Protein 6.5 -  8.1 g/dL 6.5  7.0  7.0   Total Bilirubin 0.0 - 1.2 mg/dL 0.6  0.8  0.5   Alkaline Phos 38 - 126 U/L 49  56  68   AST 15 - 41 U/L 9  9  11    ALT 0 - 44 U/L 10  11  13     02/05/2024 Bone Marrow Biopsy:       RADIOGRAPHIC STUDIES: I have personally reviewed the radiological images as listed and agreed with the findings in the report. No results found.   ASSESSMENT & PLAN:  Carla Hanson is a 58 y.o. female who presents to the clinic for continued management of multiple myeloma.   #Multiple Myeloma --Bone marrow biopsy on 02/05/2024 showed Hypercellular bone marrow (60%) involved by plasma cell neoplasm (9 % plasma cells by manual aspirate differential, approximately 10% by CD138 immunohistochemical analysis, and lambda restricted by kappa/lambda in situ hybridization  --PET scan from 02/12/2024 showed extensive lytic lesions throughout the skeleton.  --Started Cycle 1, Day 1 of Daratumumab /Dex on 03/12/2024 --Started Zometa  on 03/12/2024 q 28 days   #History of T6 compression fracture: --Likely related to multiple myeloma.  Mild pain at this time.  PLAN: --Due for cycle 4, Day 1 of Dara/Dex --Labs from today were reviewed and required no intervention. WBC 7.5, Hgb 11.4, Plt 234, creatinine and LFTs in range.  --Potassium is 3.4, encouraged to continue PO potassium supplement.  --Proceed with treatment without any dose modification.  --RTC in one week with labs and follow up prior to Cycle 4, Day 8.    All of the patient's questions were answered with apparent satisfaction. The patient knows to call the clinic with any problems, questions or concerns.   I have spent a total of 30 minutes minutes of face-to-face and non-face-to-face time, preparing to see the patient, performing a medically appropriate examination, counseling and educating the patient, documenting clinical information in the electronic health record, independently interpreting results and communicating results to the  patient, and care coordination.   Johnston Police PA-C Dept of Hematology and Oncology [facility] Phone: [phone]

## 2024-05-11 NOTE — Patient Instructions (Signed)
 [facility] - A DEPT OF [facility]  Discharge Instructions: Thank you for choosing [facility] to provide your oncology and hematology care.   If you have a lab appointment with the Cancer Center, please go directly to the Cancer Center and check in at the registration area.   Wear comfortable clothing and clothing appropriate for easy access to any Portacath or PICC line.   We strive to give you quality time with your provider. You may need to reschedule your appointment if you arrive late (15 or more minutes).  Arriving late affects you and other patients whose appointments are after yours.  Also, if you miss three or more appointments without notifying the office, you may be dismissed from the clinic at the provider's discretion.      For prescription refill requests, have your pharmacy contact our office and allow 72 hours for refills to be completed.    Today you received the following chemotherapy and/or immunotherapy agents: Velcade , Dara Faspro.       To help prevent nausea and vomiting after your treatment, we encourage you to take your nausea medication as directed.  BELOW ARE SYMPTOMS THAT SHOULD BE REPORTED IMMEDIATELY: *FEVER GREATER THAN 100.4 F (38 C) OR HIGHER *CHILLS OR SWEATING *NAUSEA AND VOMITING THAT IS NOT CONTROLLED WITH YOUR NAUSEA MEDICATION *UNUSUAL SHORTNESS OF BREATH *UNUSUAL BRUISING OR BLEEDING *URINARY PROBLEMS (pain or burning when urinating, or frequent urination) *BOWEL PROBLEMS (unusual diarrhea, constipation, pain near the anus) TENDERNESS IN MOUTH AND THROAT WITH OR WITHOUT PRESENCE OF ULCERS (sore throat, sores in mouth, or a toothache) UNUSUAL RASH, SWELLING OR PAIN  UNUSUAL VAGINAL DISCHARGE OR ITCHING   Items with * indicate a potential emergency and should be followed up as soon as possible or go to the Emergency Department if any problems should occur.  Please show the CHEMOTHERAPY ALERT CARD or  IMMUNOTHERAPY ALERT CARD at check-in to the Emergency Department and triage nurse.  Should you have questions after your visit or need to cancel or reschedule your appointment, please contact [facility] - A DEPT OF [facility]  Dept: [phone]  and follow the prompts.  Office hours are 8:00 a.m. to 4:30 p.m. Monday - Friday. Please note that voicemails left after 4:00 p.m. may not be returned until the following business day.  We are closed weekends and major holidays. You have access to a nurse at all times for urgent questions. Please call the main number to the clinic Dept: [phone] and follow the prompts.   For any non-urgent questions, you may also contact your provider using MyChart. We now offer e-Visits for anyone 69 and older to request care online for non-urgent symptoms. For details visit [email or web address].   Also download the MyChart app! Go to the app store, search MyChart, open the app, select [facility], and log in with your MyChart username and password.  Zoledronic  Acid Injection (Cancer) What is this medication? ZOLEDRONIC  ACID (ZOE le dron ik AS id) treats high calcium  levels in the blood caused by cancer. It may also be used with chemotherapy to treat weakened bones caused by cancer. It works by slowing down the release of calcium  from bones. This lowers calcium  levels in your blood. It also makes your bones stronger and less likely to break (fracture). It belongs to a group of medications called bisphosphonates. This medicine may be used for other purposes; ask your  health care provider or pharmacist if you have questions. COMMON BRAND NAME(S): Zometa , Zometa  Powder What should I tell my care team before I take this medication? They need to know if you have any of these conditions: Dehydration Dental disease Kidney disease Liver disease Low levels of calcium  in the blood Lung or breathing disease, such as asthma Receiving  steroids, such as dexamethasone  or prednisone  An unusual or allergic reaction to zoledronic  acid, other medications, foods, dyes, or preservatives Pregnant or trying to get pregnant Breast-feeding How should I use this medication? This medication is injected into a vein. It is given by your care team in a hospital or clinic setting. Talk to your care team about the use of this medication in children. Special care may be needed. Overdosage: If you think you have taken too much of this medicine contact a poison control center or emergency room at once. NOTE: This medicine is only for you. Do not share this medicine with others. What if I miss a dose? Keep appointments for follow-up doses. It is important not to miss your dose. Call your care team if you are unable to keep an appointment. What may interact with this medication? Certain antibiotics given by injection Diuretics, such as bumetanide, furosemide  NSAIDs, medications for pain and inflammation, such as ibuprofen or naproxen Teriparatide Thalidomide This list may not describe all possible interactions. Give your health care provider a list of all the medicines, herbs, non-prescription drugs, or dietary supplements you use. Also tell them if you smoke, drink alcohol, or use illegal drugs. Some items may interact with your medicine. What should I watch for while using this medication? Visit your care team for regular checks on your progress. It may be some time before you see the benefit from this medication. Some people who take this medication have severe bone, joint, or muscle pain. This medication may also increase your risk for jaw problems or a broken thigh bone. Tell your care team right away if you have severe pain in your jaw, bones, joints, or muscles. Tell you care team if you have any pain that does not go away or that gets worse. Tell your dentist and dental surgeon that you are taking this medication. You should not have major  dental surgery while on this medication. See your dentist to have a dental exam and fix any dental problems before starting this medication. Take good care of your teeth while on this medication. Make sure you see your dentist for regular follow-up appointments. You should make sure you get enough calcium  and vitamin D  while you are taking this medication. Discuss the foods you eat and the vitamins you take with your care team. Check with your care team if you have severe diarrhea, nausea, and vomiting, or if you sweat a lot. The loss of too much body fluid may make it dangerous for you to take this medication. You may need bloodwork while taking this medication. Talk to your care team if you wish to become pregnant or think you might be pregnant. This medication can cause serious birth defects. What side effects may I notice from receiving this medication? Side effects that you should report to your care team as soon as possible: Allergic reactions--skin rash, itching, hives, swelling of the face, lips, tongue, or throat Kidney injury--decrease in the amount of urine, swelling of the ankles, hands, or feet Low calcium  level--muscle pain or cramps, confusion, tingling, or numbness in the hands or feet Osteonecrosis of the jaw--pain,  swelling, or redness in the mouth, numbness of the jaw, poor healing after dental work, unusual discharge from the mouth, visible bones in the mouth Severe bone, joint, or muscle pain Side effects that usually do not require medical attention (report to your care team if they continue or are bothersome): Constipation Fatigue Fever Loss of appetite Nausea Stomach pain This list may not describe all possible side effects. Call your doctor for medical advice about side effects. You may report side effects to FDA at 1-800-FDA-1088. Where should I keep my medication? This medication is given in a hospital or clinic. It will not be stored at home. NOTE: This sheet is a  summary. It may not cover all possible information. If you have questions about this medicine, talk to your doctor, pharmacist, or health care provider.  2024 Elsevier/Gold Standard (2021-12-14 00:00:00)

## 2024-05-12 LAB — KAPPA/LAMBDA LIGHT CHAINS
Kappa free light chain: 4.6 mg/L (ref 3.3–19.4)
Kappa, lambda light chain ratio: 1.07 (ref 0.26–1.65)
Lambda free light chains: 4.3 mg/L — ABNORMAL LOW (ref 5.7–26.3)

## 2024-05-13 ENCOUNTER — Other Ambulatory Visit (HOSPITAL_COMMUNITY): Payer: Self-pay

## 2024-05-13 ENCOUNTER — Encounter (INDEPENDENT_AMBULATORY_CARE_PROVIDER_SITE_OTHER): Payer: Self-pay | Admitting: Family Medicine

## 2024-05-14 LAB — MULTIPLE MYELOMA PANEL, SERUM
Albumin SerPl Elph-Mcnc: 3.7 g/dL (ref 2.9–4.4)
Albumin/Glob SerPl: 1.5 (ref 0.7–1.7)
Alpha 1: 0.2 g/dL (ref 0.0–0.4)
Alpha2 Glob SerPl Elph-Mcnc: 0.8 g/dL (ref 0.4–1.0)
B-Globulin SerPl Elph-Mcnc: 0.9 g/dL (ref 0.7–1.3)
Gamma Glob SerPl Elph-Mcnc: 0.6 g/dL (ref 0.4–1.8)
Globulin, Total: 2.6 g/dL (ref 2.2–3.9)
IgA: 14 mg/dL — ABNORMAL LOW (ref 87–352)
IgG (Immunoglobin G), Serum: 719 mg/dL (ref 586–1602)
IgM (Immunoglobulin M), Srm: 16 mg/dL — ABNORMAL LOW (ref 26–217)
M Protein SerPl Elph-Mcnc: 0.4 g/dL — ABNORMAL HIGH
Total Protein ELP: 6.3 g/dL (ref 6.0–8.5)

## 2024-05-18 ENCOUNTER — Ambulatory Visit (INDEPENDENT_AMBULATORY_CARE_PROVIDER_SITE_OTHER): Admitting: Family Medicine

## 2024-05-18 ENCOUNTER — Encounter: Payer: Self-pay | Admitting: Family Medicine

## 2024-05-18 VITALS — BP 127/85 | HR 80 | Ht 61.0 in | Wt 278.2 lb

## 2024-05-18 DIAGNOSIS — I10 Essential (primary) hypertension: Secondary | ICD-10-CM | POA: Diagnosis not present

## 2024-05-18 DIAGNOSIS — R600 Localized edema: Secondary | ICD-10-CM

## 2024-05-18 DIAGNOSIS — E876 Hypokalemia: Secondary | ICD-10-CM | POA: Diagnosis not present

## 2024-05-18 NOTE — Assessment & Plan Note (Addendum)
Resolved on Lasix

## 2024-05-18 NOTE — Progress Notes (Signed)
    SUBJECTIVE:   CHIEF COMPLAINT / HPI:   HTN/Hypokalemia/Leg edema/Hypercalcemia: She is here for a follow-up. Compliant with Lasix  40 mg QD and Losartan  50 mg QD. Feels like her leg edema is very much improved. Home BP is in the normal range. No new concerns. She is compliant with KDUR 20 mEQ/L. She gets a lab check weekly with oncology.  PERTINENT  PMH / PSH: PMHx reviewed  OBJECTIVE:   BP 127/85   Pulse 80   Ht 5' 1 (1.549 m)   Wt 278 lb 3.2 oz (126.2 kg)   LMP  (LMP Unknown)   SpO2 98%   BMI 52.57 kg/m   Physical Exam Vitals and nursing note reviewed.  Cardiovascular:     Rate and Rhythm: Normal rate and regular rhythm.     Heart sounds: Normal heart sounds. No murmur heard. Pulmonary:     Effort: Pulmonary effort is normal. No respiratory distress.     Breath sounds: Normal breath sounds. No wheezing.  Musculoskeletal:     Right lower leg: No edema.     Left lower leg: No edema.      ASSESSMENT/PLAN:   Assessment & Plan Essential hypertension BP looks great on her current regimen No medication adjustment needed Continue Lasix  40 mg every day Continue Losartan  50 mg every day Monitor BP closely at home Hypokalemia K+ stable at 3.4 Continue KDur 20 mEQ/L every day I gave information about potassium rich diet Continue weekly lab with oncology Consider dose adjustment if + falls below 3.3 - 3.4 despite addition of potassium rich diet She agreed with the plan Edema leg Resolved on Lasix  Hypercalcemia of malignancy Ca2+ level normalized on Lasix  Weekly lab monitoring with oncology F/U with me as needed     Otto Fairly, MD [facility]

## 2024-05-18 NOTE — Patient Instructions (Addendum)
 Nice seeing your today. Your BP is steady. Your potassium is a bit low. However, we will continue your current supplement dose. If less than 3.3 we might need to go up on your supplement. I will see you back in 3 months. Please, continue to follow up weekly with oncology.   There is plenty of potassium in:  apricots and dried fruit tree fruits -- such as avocados, apples, oranges and bananas leafy greens -- such as spinach, kale and silverbeet vine fruits -- such as tomatoes, cucumbers, zucchini, eggplant and pumpkin root vegetables -- such as carrots, potatoes and sweet potatoes legumes -- such as beans and peas milk, yoghurt, meat and chicken, as well as fish -- such as halibut, tuna, cod, snapper

## 2024-05-18 NOTE — Assessment & Plan Note (Addendum)
 Ca2+ level normalized on Lasix  Weekly lab monitoring with oncology F/U with me as needed

## 2024-05-18 NOTE — Assessment & Plan Note (Addendum)
 K+ stable at 3.4 Continue KDur 20 mEQ/L every day I gave information about potassium rich diet Continue weekly lab with oncology Consider dose adjustment if + falls below 3.3 - 3.4 despite addition of potassium rich diet She agreed with the plan

## 2024-05-18 NOTE — Assessment & Plan Note (Addendum)
 BP looks great on her current regimen No medication adjustment needed Continue Lasix  40 mg every day Continue Losartan  50 mg every day Monitor BP closely at home

## 2024-05-19 ENCOUNTER — Other Ambulatory Visit

## 2024-05-19 ENCOUNTER — Inpatient Hospital Stay

## 2024-05-19 ENCOUNTER — Ambulatory Visit: Admitting: Hematology

## 2024-05-19 VITALS — BP 132/88 | HR 70 | Resp 18 | Wt 267.5 lb

## 2024-05-19 DIAGNOSIS — Z5111 Encounter for antineoplastic chemotherapy: Secondary | ICD-10-CM | POA: Diagnosis not present

## 2024-05-19 DIAGNOSIS — C9000 Multiple myeloma not having achieved remission: Secondary | ICD-10-CM

## 2024-05-19 LAB — CMP (CANCER CENTER ONLY)
ALT: 13 U/L (ref 0–44)
AST: 10 U/L — ABNORMAL LOW (ref 15–41)
Albumin: 4.2 g/dL (ref 3.5–5.0)
Alkaline Phosphatase: 46 U/L (ref 38–126)
Anion gap: 7 (ref 5–15)
BUN: 9 mg/dL (ref 6–20)
CO2: 31 mmol/L (ref 22–32)
Calcium: 9.5 mg/dL (ref 8.9–10.3)
Chloride: 104 mmol/L (ref 98–111)
Creatinine: 0.56 mg/dL (ref 0.44–1.00)
GFR, Estimated: >60 mL/min (ref >60)
Glucose, Bld: 89 mg/dL (ref 70–99)
Potassium: 3.7 mmol/L (ref 3.5–5.1)
Sodium: 142 mmol/L (ref 135–145)
Total Bilirubin: 0.6 mg/dL (ref 0.0–1.2)
Total Protein: 6.8 g/dL (ref 6.5–8.1)

## 2024-05-19 LAB — CBC WITH DIFFERENTIAL (CANCER CENTER ONLY)
Abs Immature Granulocytes: 0.02 K/uL (ref 0.00–0.07)
Basophils Absolute: 0 K/uL (ref 0.0–0.1)
Basophils Relative: 0 %
Eosinophils Absolute: 0.1 K/uL (ref 0.0–0.5)
Eosinophils Relative: 2 %
HCT: 37.4 % (ref 36.0–46.0)
Hemoglobin: 12 g/dL (ref 12.0–15.0)
Immature Granulocytes: 0 %
Lymphocytes Relative: 19 %
Lymphs Abs: 1.7 K/uL (ref 0.7–4.0)
MCH: 27.1 pg (ref 26.0–34.0)
MCHC: 32.1 g/dL (ref 30.0–36.0)
MCV: 84.4 fL (ref 80.0–100.0)
Monocytes Absolute: 0.6 K/uL (ref 0.1–1.0)
Monocytes Relative: 7 %
Neutro Abs: 6.2 K/uL (ref 1.7–7.7)
Neutrophils Relative %: 72 %
Platelet Count: 306 K/uL (ref 150–400)
RBC: 4.43 MIL/uL (ref 3.87–5.11)
RDW: 15.5 % (ref 11.5–15.5)
WBC Count: 8.5 K/uL (ref 4.0–10.5)
nRBC: 0 % (ref 0.0–0.2)

## 2024-05-19 MED ORDER — DIPHENHYDRAMINE HCL 25 MG PO CAPS
50.0000 mg | ORAL_CAPSULE | Freq: Once | ORAL | Status: AC
  Administered 2024-05-19: 50 mg via ORAL
  Filled 2024-05-19: qty 2

## 2024-05-19 MED ORDER — ACETAMINOPHEN 325 MG PO TABS
650.0000 mg | ORAL_TABLET | Freq: Once | ORAL | Status: AC
  Administered 2024-05-19: 650 mg via ORAL
  Filled 2024-05-19: qty 2

## 2024-05-19 MED ORDER — DARATUMUMAB-HYALURONIDASE-FIHJ 1800-30000 MG-UT/15ML ~~LOC~~ SOLN
1800.0000 mg | Freq: Once | SUBCUTANEOUS | Status: AC
  Administered 2024-05-19: 1800 mg via SUBCUTANEOUS
  Filled 2024-05-19: qty 15

## 2024-05-19 MED ORDER — BORTEZOMIB CHEMO SQ INJECTION 3.5 MG (2.5MG/ML)
1.5000 mg/m2 | Freq: Once | INTRAMUSCULAR | Status: AC
  Administered 2024-05-19: 3.75 mg via SUBCUTANEOUS
  Filled 2024-05-19: qty 1.5

## 2024-05-19 MED ORDER — FAMOTIDINE 20 MG PO TABS
20.0000 mg | ORAL_TABLET | Freq: Once | ORAL | Status: AC
  Administered 2024-05-19: 20 mg via ORAL
  Filled 2024-05-19: qty 1

## 2024-05-19 MED ORDER — DEXAMETHASONE 4 MG PO TABS
20.0000 mg | ORAL_TABLET | Freq: Once | ORAL | Status: AC
  Administered 2024-05-19: 20 mg via ORAL
  Filled 2024-05-19: qty 5

## 2024-05-19 NOTE — Patient Instructions (Signed)
 [facility] - A DEPT OF [facility]  Discharge Instructions: Thank you for choosing [facility] to provide your oncology and hematology care.   If you have a lab appointment with the Cancer Center, please go directly to the Cancer Center and check in at the registration area.   Wear comfortable clothing and clothing appropriate for easy access to any Portacath or PICC line.   We strive to give you quality time with your provider. You may need to reschedule your appointment if you arrive late (15 or more minutes).  Arriving late affects you and other patients whose appointments are after yours.  Also, if you miss three or more appointments without notifying the office, you may be dismissed from the clinic at the provider's discretion.      For prescription refill requests, have your pharmacy contact our office and allow 72 hours for refills to be completed.    Today you received the following chemotherapy and/or immunotherapy agents: Velcade , Dara Faspro.       To help prevent nausea and vomiting after your treatment, we encourage you to take your nausea medication as directed.  BELOW ARE SYMPTOMS THAT SHOULD BE REPORTED IMMEDIATELY: *FEVER GREATER THAN 100.4 F (38 C) OR HIGHER *CHILLS OR SWEATING *NAUSEA AND VOMITING THAT IS NOT CONTROLLED WITH YOUR NAUSEA MEDICATION *UNUSUAL SHORTNESS OF BREATH *UNUSUAL BRUISING OR BLEEDING *URINARY PROBLEMS (pain or burning when urinating, or frequent urination) *BOWEL PROBLEMS (unusual diarrhea, constipation, pain near the anus) TENDERNESS IN MOUTH AND THROAT WITH OR WITHOUT PRESENCE OF ULCERS (sore throat, sores in mouth, or a toothache) UNUSUAL RASH, SWELLING OR PAIN  UNUSUAL VAGINAL DISCHARGE OR ITCHING   Items with * indicate a potential emergency and should be followed up as soon as possible or go to the Emergency Department if any problems should occur.  Please show the CHEMOTHERAPY ALERT CARD or  IMMUNOTHERAPY ALERT CARD at check-in to the Emergency Department and triage nurse.  Should you have questions after your visit or need to cancel or reschedule your appointment, please contact [facility] - A DEPT OF [facility]  Dept: [phone]  and follow the prompts.  Office hours are 8:00 a.m. to 4:30 p.m. Monday - Friday. Please note that voicemails left after 4:00 p.m. may not be returned until the following business day.  We are closed weekends and major holidays. You have access to a nurse at all times for urgent questions. Please call the main number to the clinic Dept: [phone] and follow the prompts.   For any non-urgent questions, you may also contact your provider using MyChart. We now offer e-Visits for anyone 69 and older to request care online for non-urgent symptoms. For details visit [email or web address].   Also download the MyChart app! Go to the app store, search MyChart, open the app, select [facility], and log in with your MyChart username and password.  Zoledronic  Acid Injection (Cancer) What is this medication? ZOLEDRONIC  ACID (ZOE le dron ik AS id) treats high calcium  levels in the blood caused by cancer. It may also be used with chemotherapy to treat weakened bones caused by cancer. It works by slowing down the release of calcium  from bones. This lowers calcium  levels in your blood. It also makes your bones stronger and less likely to break (fracture). It belongs to a group of medications called bisphosphonates. This medicine may be used for other purposes; ask your  health care provider or pharmacist if you have questions. COMMON BRAND NAME(S): Zometa , Zometa  Powder What should I tell my care team before I take this medication? They need to know if you have any of these conditions: Dehydration Dental disease Kidney disease Liver disease Low levels of calcium  in the blood Lung or breathing disease, such as asthma Receiving  steroids, such as dexamethasone  or prednisone  An unusual or allergic reaction to zoledronic  acid, other medications, foods, dyes, or preservatives Pregnant or trying to get pregnant Breast-feeding How should I use this medication? This medication is injected into a vein. It is given by your care team in a hospital or clinic setting. Talk to your care team about the use of this medication in children. Special care may be needed. Overdosage: If you think you have taken too much of this medicine contact a poison control center or emergency room at once. NOTE: This medicine is only for you. Do not share this medicine with others. What if I miss a dose? Keep appointments for follow-up doses. It is important not to miss your dose. Call your care team if you are unable to keep an appointment. What may interact with this medication? Certain antibiotics given by injection Diuretics, such as bumetanide, furosemide  NSAIDs, medications for pain and inflammation, such as ibuprofen or naproxen Teriparatide Thalidomide This list may not describe all possible interactions. Give your health care provider a list of all the medicines, herbs, non-prescription drugs, or dietary supplements you use. Also tell them if you smoke, drink alcohol, or use illegal drugs. Some items may interact with your medicine. What should I watch for while using this medication? Visit your care team for regular checks on your progress. It may be some time before you see the benefit from this medication. Some people who take this medication have severe bone, joint, or muscle pain. This medication may also increase your risk for jaw problems or a broken thigh bone. Tell your care team right away if you have severe pain in your jaw, bones, joints, or muscles. Tell you care team if you have any pain that does not go away or that gets worse. Tell your dentist and dental surgeon that you are taking this medication. You should not have major  dental surgery while on this medication. See your dentist to have a dental exam and fix any dental problems before starting this medication. Take good care of your teeth while on this medication. Make sure you see your dentist for regular follow-up appointments. You should make sure you get enough calcium  and vitamin D  while you are taking this medication. Discuss the foods you eat and the vitamins you take with your care team. Check with your care team if you have severe diarrhea, nausea, and vomiting, or if you sweat a lot. The loss of too much body fluid may make it dangerous for you to take this medication. You may need bloodwork while taking this medication. Talk to your care team if you wish to become pregnant or think you might be pregnant. This medication can cause serious birth defects. What side effects may I notice from receiving this medication? Side effects that you should report to your care team as soon as possible: Allergic reactions--skin rash, itching, hives, swelling of the face, lips, tongue, or throat Kidney injury--decrease in the amount of urine, swelling of the ankles, hands, or feet Low calcium  level--muscle pain or cramps, confusion, tingling, or numbness in the hands or feet Osteonecrosis of the jaw--pain,  swelling, or redness in the mouth, numbness of the jaw, poor healing after dental work, unusual discharge from the mouth, visible bones in the mouth Severe bone, joint, or muscle pain Side effects that usually do not require medical attention (report to your care team if they continue or are bothersome): Constipation Fatigue Fever Loss of appetite Nausea Stomach pain This list may not describe all possible side effects. Call your doctor for medical advice about side effects. You may report side effects to FDA at 1-800-FDA-1088. Where should I keep my medication? This medication is given in a hospital or clinic. It will not be stored at home. NOTE: This sheet is a  summary. It may not cover all possible information. If you have questions about this medicine, talk to your doctor, pharmacist, or health care provider.  2024 Elsevier/Gold Standard (2021-12-14 00:00:00)

## 2024-05-20 ENCOUNTER — Encounter: Payer: Self-pay | Admitting: Family Medicine

## 2024-05-20 ENCOUNTER — Other Ambulatory Visit (HOSPITAL_COMMUNITY): Payer: Self-pay

## 2024-05-20 DIAGNOSIS — E7849 Other hyperlipidemia: Secondary | ICD-10-CM

## 2024-05-20 MED ORDER — ROSUVASTATIN CALCIUM 20 MG PO TABS
20.0000 mg | ORAL_TABLET | Freq: Every day | ORAL | 1 refills | Status: DC
  Filled 2024-05-20: qty 90, 90d supply, fill #0
  Filled 2024-07-21 – 2024-10-03 (×3): qty 90, 90d supply, fill #1

## 2024-05-21 ENCOUNTER — Other Ambulatory Visit (HOSPITAL_COMMUNITY): Payer: Self-pay

## 2024-05-26 ENCOUNTER — Other Ambulatory Visit: Payer: Self-pay

## 2024-05-26 ENCOUNTER — Inpatient Hospital Stay

## 2024-05-26 ENCOUNTER — Other Ambulatory Visit (HOSPITAL_COMMUNITY): Payer: Self-pay

## 2024-05-26 ENCOUNTER — Other Ambulatory Visit: Payer: Self-pay | Admitting: Family Medicine

## 2024-05-26 VITALS — BP 134/71 | HR 85 | Temp 98.2°F | Resp 18 | Wt 270.5 lb

## 2024-05-26 DIAGNOSIS — C9000 Multiple myeloma not having achieved remission: Secondary | ICD-10-CM

## 2024-05-26 DIAGNOSIS — Z5111 Encounter for antineoplastic chemotherapy: Secondary | ICD-10-CM | POA: Diagnosis not present

## 2024-05-26 LAB — CBC WITH DIFFERENTIAL (CANCER CENTER ONLY)
Abs Immature Granulocytes: 0.02 K/uL (ref 0.00–0.07)
Basophils Absolute: 0 K/uL (ref 0.0–0.1)
Basophils Relative: 0 %
Eosinophils Absolute: 0.1 K/uL (ref 0.0–0.5)
Eosinophils Relative: 1 %
HCT: 38.3 % (ref 36.0–46.0)
Hemoglobin: 12.5 g/dL (ref 12.0–15.0)
Immature Granulocytes: 0 %
Lymphocytes Relative: 23 %
Lymphs Abs: 1.7 K/uL (ref 0.7–4.0)
MCH: 27.2 pg (ref 26.0–34.0)
MCHC: 32.6 g/dL (ref 30.0–36.0)
MCV: 83.3 fL (ref 80.0–100.0)
Monocytes Absolute: 0.5 K/uL (ref 0.1–1.0)
Monocytes Relative: 6 %
Neutro Abs: 5.4 K/uL (ref 1.7–7.7)
Neutrophils Relative %: 70 %
Platelet Count: 294 K/uL (ref 150–400)
RBC: 4.6 MIL/uL (ref 3.87–5.11)
RDW: 15 % (ref 11.5–15.5)
WBC Count: 7.7 K/uL (ref 4.0–10.5)
nRBC: 0 % (ref 0.0–0.2)

## 2024-05-26 LAB — CMP (CANCER CENTER ONLY)
ALT: 10 U/L (ref 0–44)
AST: 11 U/L — ABNORMAL LOW (ref 15–41)
Albumin: 4.3 g/dL (ref 3.5–5.0)
Alkaline Phosphatase: 44 U/L (ref 38–126)
Anion gap: 8 (ref 5–15)
BUN: 9 mg/dL (ref 6–20)
CO2: 28 mmol/L (ref 22–32)
Calcium: 9.5 mg/dL (ref 8.9–10.3)
Chloride: 105 mmol/L (ref 98–111)
Creatinine: 0.55 mg/dL (ref 0.44–1.00)
GFR, Estimated: >60 mL/min (ref >60)
Glucose, Bld: 102 mg/dL — ABNORMAL HIGH (ref 70–99)
Potassium: 3.3 mmol/L — ABNORMAL LOW (ref 3.5–5.1)
Sodium: 141 mmol/L (ref 135–145)
Total Bilirubin: 0.6 mg/dL (ref 0.0–1.2)
Total Protein: 6.8 g/dL (ref 6.5–8.1)

## 2024-05-26 MED ORDER — FAMOTIDINE 20 MG PO TABS
20.0000 mg | ORAL_TABLET | Freq: Once | ORAL | Status: AC
  Administered 2024-05-26: 20 mg via ORAL
  Filled 2024-05-26: qty 1

## 2024-05-26 MED ORDER — BORTEZOMIB CHEMO SQ INJECTION 3.5 MG (2.5MG/ML)
1.5000 mg/m2 | Freq: Once | INTRAMUSCULAR | Status: AC
  Administered 2024-05-26: 3.75 mg via SUBCUTANEOUS
  Filled 2024-05-26: qty 1.5

## 2024-05-26 MED ORDER — DIPHENHYDRAMINE HCL 25 MG PO CAPS
50.0000 mg | ORAL_CAPSULE | Freq: Once | ORAL | Status: AC
  Administered 2024-05-26: 50 mg via ORAL
  Filled 2024-05-26: qty 2

## 2024-05-26 MED ORDER — TIZANIDINE HCL 4 MG PO TABS
4.0000 mg | ORAL_TABLET | Freq: Two times a day (BID) | ORAL | 1 refills | Status: DC | PRN
  Filled 2024-05-26: qty 58, 29d supply, fill #0
  Filled 2024-05-26: qty 2, 1d supply, fill #0
  Filled 2024-07-21: qty 60, 30d supply, fill #1

## 2024-05-26 MED ORDER — DEXAMETHASONE 4 MG PO TABS
20.0000 mg | ORAL_TABLET | Freq: Once | ORAL | Status: AC
  Administered 2024-05-26: 20 mg via ORAL
  Filled 2024-05-26: qty 5

## 2024-05-26 MED ORDER — DARATUMUMAB-HYALURONIDASE-FIHJ 1800-30000 MG-UT/15ML ~~LOC~~ SOLN
1800.0000 mg | Freq: Once | SUBCUTANEOUS | Status: AC
  Administered 2024-05-26: 1800 mg via SUBCUTANEOUS
  Filled 2024-05-26: qty 15

## 2024-05-26 MED ORDER — ACETAMINOPHEN 325 MG PO TABS
650.0000 mg | ORAL_TABLET | Freq: Once | ORAL | Status: AC
  Administered 2024-05-26: 650 mg via ORAL
  Filled 2024-05-26: qty 2

## 2024-05-26 NOTE — Patient Instructions (Signed)
 [facility] - A DEPT OF [facility]  Discharge Instructions: Thank you for choosing [facility] to provide your oncology and hematology care.   If you have a lab appointment with the Cancer Center, please go directly to the Cancer Center and check in at the registration area.   Wear comfortable clothing and clothing appropriate for easy access to any Portacath or PICC line.   We strive to give you quality time with your provider. You may need to reschedule your appointment if you arrive late (15 or more minutes).  Arriving late affects you and other patients whose appointments are after yours.  Also, if you miss three or more appointments without notifying the office, you may be dismissed from the clinic at the provider's discretion.      For prescription refill requests, have your pharmacy contact our office and allow 72 hours for refills to be completed.    Today you received the following chemotherapy and/or immunotherapy agents velcade  and darzalex  faspro      To help prevent nausea and vomiting after your treatment, we encourage you to take your nausea medication as directed.  BELOW ARE SYMPTOMS THAT SHOULD BE REPORTED IMMEDIATELY: *FEVER GREATER THAN 100.4 F (38 C) OR HIGHER *CHILLS OR SWEATING *NAUSEA AND VOMITING THAT IS NOT CONTROLLED WITH YOUR NAUSEA MEDICATION *UNUSUAL SHORTNESS OF BREATH *UNUSUAL BRUISING OR BLEEDING *URINARY PROBLEMS (pain or burning when urinating, or frequent urination) *BOWEL PROBLEMS (unusual diarrhea, constipation, pain near the anus) TENDERNESS IN MOUTH AND THROAT WITH OR WITHOUT PRESENCE OF ULCERS (sore throat, sores in mouth, or a toothache) UNUSUAL RASH, SWELLING OR PAIN  UNUSUAL VAGINAL DISCHARGE OR ITCHING   Items with * indicate a potential emergency and should be followed up as soon as possible or go to the Emergency Department if any problems should occur.  Please show the CHEMOTHERAPY ALERT CARD  or IMMUNOTHERAPY ALERT CARD at check-in to the Emergency Department and triage nurse.  Should you have questions after your visit or need to cancel or reschedule your appointment, please contact [facility] - A DEPT OF [facility]  Dept: [phone]  and follow the prompts.  Office hours are 8:00 a.m. to 4:30 p.m. Monday - Friday. Please note that voicemails left after 4:00 p.m. may not be returned until the following business day.  We are closed weekends and major holidays. You have access to a nurse at all times for urgent questions. Please call the main number to the clinic Dept: [phone] and follow the prompts.   For any non-urgent questions, you may also contact your provider using MyChart. We now offer e-Visits for anyone 45 and older to request care online for non-urgent symptoms. For details visit [email or web address].   Also download the MyChart app! Go to the app store, search "MyChart", open the app, select [facility], and log in with your MyChart username and password.

## 2024-05-31 ENCOUNTER — Other Ambulatory Visit: Payer: Self-pay | Admitting: Family Medicine

## 2024-05-31 DIAGNOSIS — Z1211 Encounter for screening for malignant neoplasm of colon: Secondary | ICD-10-CM

## 2024-06-01 ENCOUNTER — Other Ambulatory Visit: Payer: Self-pay | Admitting: Hematology

## 2024-06-01 DIAGNOSIS — C9000 Multiple myeloma not having achieved remission: Secondary | ICD-10-CM

## 2024-06-01 NOTE — Progress Notes (Signed)
 HEMATOLOGY/ONCOLOGY CLINIC NOTE  Date of Service: 06/02/2024   Patient Care Team: Anders Otto DASEN, MD as PCP - General (Family Medicine) Rosemarie [name] RAMAN, MD as Consulting Physician (Neurology) Wonda Cy BROCKS, RD as Dietitian (Family Medicine) Onesimo Emaline Brink, MD as Consulting Physician (Hematology)  CHIEF COMPLAINTS/PURPOSE OF CONSULTATION:  F/u for evaluation and mx of Multiple myeloma  HISTORY OF PRESENTING ILLNESS:  Carla Hanson is a wonderful 58 y.o. 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   Patient has a history of hypertension, dyslipidemia, COPD, history of PE/DVT, prediabetes, obesity who notes that she was working out in the gym and had significant back pain resulting in an x-ray that showed T6 compression fracture in January 2025.   She had a follow-up with healthy weight and wellness clinic on 3/27 and reported symptoms of significant weakness dizziness lightheadedness and dehydration despite drinking a lot of water.  She also noted nausea but no emesis and constipation. Patient was seen in the emergency room and noted to have severe hypercalcemia with a calcium  level of 17.8 with an albumin of 3.8, total protein was elevated at 9.1 with an increased globulin gap, potassium was 2.7. 25-hydroxy vitamin D  levels were within normal limits at 66.9. TSH was normal at 0.736. PTH levels were appropriately suppressed at 6 which is suggestive of non hyperparathyroid hypercalcemia. CBC-on admission showed normal hemoglobin of 13.1 in the context of significant hemoconcentration.  This might be hiding her baseline anemia which might declare itself once the patient's volume status is replaced.  No other cytopenias. Patient notes no acute new focal bone pains.   Patient notes no fevers no chills no night sweats. No previous history of cancers. Her last mammogram was on 02/07/2023 and was  within normal limits.  She is previously noted to have a benign lipoma in the right breast which was resected. Patient has been ex-smoker and smoked half to 1 pack a day for 35 years and quit in December 2017.   New focal symptoms. Patient has received 1 dose of Zometa  and IV fluids and calcium  levels today are down to 12 with a corrected calcium  between 13 and 14. She is receiving IV fluids potassium replacement phosphorus  replacement and calcitonin today.   Normal labs have been ordered and patient is pending whole-body skeletal survey.  24-hour urine collection for myeloma has been sent out.   INTERVAL HISTORY:  Carla Hanson is a 57 y.o. female here for follow-up for evaluation and management of multiple myeloma.   She was last seen by me on 04/27/2024 and reported mild leg swelling and mild pain around the chest.   Patient was most recently seen by Thayil PA on 05/11/2024 and reported an episode of intermittent mild nausea which had resolved, occasional chronic SOB on exertion, and noted improved bilateral lower extremity edema.   She presents today for toxicity check prior to cycle 5 day 1 of treatment.   Patient is accompanied by her mother during today's visit. She complains of having a mildly upset stomach with nausea last week. Patient notes that while it was possible that her GI issues was due to something she ate. However, she notes that she stopped her Wegovy  last week due to her GI issues and notes that her nausea improved. Patient notes that she was seen by her PCP this morning and her Wegovy  was held. She reports no other new  concerns since her last clinical visit.   She notes that she is working towards completing more daily activities.   Patient has been tolerating Velcade  with no new or major toxicities. She denies any tingling/numbness in hands/feet, back pain, other bone pain, new dental issues, leg swelling, fever, chills, night sweats, or new bone pain.   Patient notes  that she is off of her post-treatment dexamethasone .   MEDICAL HISTORY:  Past Medical History:  Diagnosis Date   Alcohol abuse    Anxiety    Back pain    Bipolar disorder (HCC)    Bronchitis    COPD exacerbation (HCC) 05/10/2016   Drug use    Edema 02/24/2024   History of blood clots    Hyperlipidemia    Hypertension    Influenza A 12/01/2018   Joint pain    Left leg DVT (HCC) 09/29/2013   Lipoma    Abdomen   LIPOMA 01/20/2008   Obesity    Occasional tremors 05/12/2019   Osteoarthritis of right knee    Other fatigue    Painful menstrual periods 01/05/2012   Pneumonia    Prediabetes    Seizure (HCC)    Shortness of breath on exertion    Stroke (HCC)    Tobacco abuse     SURGICAL HISTORY: Past Surgical History:  Procedure Laterality Date   CESAREAN SECTION     CYSTECTOMY     LIPOMA EXCISION  03/2011   ORIF ANKLE FRACTURE  03/13/2012   Procedure: OPEN REDUCTION INTERNAL FIXATION (ORIF) ANKLE FRACTURE;  Surgeon: Lynwood FORBES Better, MD;  Location: [facility];  Service: Orthopedics;  Laterality: Right;   TUBAL LIGATION      SOCIAL HISTORY: Social History   Socioeconomic History   Marital status: Divorced    Spouse name: Not on file   Number of children: 4   Years of education: 10   Highest education level: Not on file  Occupational History   Occupation: stay at home  Tobacco Use   Smoking status: Former    Current packs/day: 0.00    Average packs/day: 0.5 packs/day for 35.0 years (17.5 ttl pk-yrs)    Types: Cigarettes    Start date: 11/04/1981    Quit date: 10/2016    Years since quitting: 7.6   Smokeless tobacco: Never  Vaping Use   Vaping status: Never Used  Substance and Sexual Activity   Alcohol use: No    Alcohol/week: 0.0 standard drinks of alcohol    Comment: Quit 10/2016   Drug use: Yes    Types: Marijuana   Sexual activity: Not Currently    Comment: tubal  Other Topics Concern   Not on file  Social History Narrative   Unemployed, 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  Considering nursing.    Lives at home alone   Right-handed   Caffeine: not much   Social Drivers of Corporate investment banker Strain: Low Risk  (05/14/2024)   Overall Financial Resource Strain (CARDIA)    Difficulty of Paying Living Expenses: Not hard at all  Food Insecurity: No Food Insecurity (05/14/2024)   Hunger Vital Sign    Worried About Running Out of Food in the Last Year: Never true    Ran Out of Food in the Last Year: Never true  Transportation Needs: No Transportation Needs (05/14/2024)   PRAPARE - Administrator, Civil Service (Medical): No  Lack of Transportation (Non-Medical): No  Physical Activity: Insufficiently Active (05/14/2024)   Exercise Vital Sign    Days of Exercise per Week: 3 days    Minutes of Exercise per Session: 20 min  Stress: No Stress Concern Present (05/14/2024)   [employer] of Occupational Health - Occupational Stress Questionnaire    Feeling of Stress: Not at all  Social Connections: Moderately Integrated (05/14/2024)   Social Connection and Isolation Panel    Frequency of Communication with Friends and Family: More than three times a week    Frequency of Social Gatherings with Friends and Family: More than three times a week    Attends Religious Services: More than 4 times per year    Active Member of [employer] or Organizations: Yes    Attends Banker Meetings: 1 to 4 times per year    Marital Status: Divorced  Catering manager Violence: Not At Risk (02/06/2024)   Humiliation, Afraid, Rape, and Kick questionnaire    Fear of Current or Ex-Partner: No    Emotionally Abused: No    Physically Abused: No    Sexually Abused: No    FAMILY HISTORY: Family History  Problem Relation Age of Onset   Other Mother        High blood pressure runs in the family   Obesity Mother    Asthma Father    Heart attack Father     Obesity Father    Diabetes Maternal Aunt    Breast cancer Neg Hx     ALLERGIES:  is allergic to latuda  [lurasidone ].  MEDICATIONS:  Current Outpatient Medications  Medication Sig Dispense Refill   acetaminophen  (TYLENOL ) 325 MG tablet Take 650 mg by mouth every 6 (six) hours as needed for mild pain (pain score 1-3).     acyclovir  (ZOVIRAX ) 400 MG tablet Take 1 tablet (400 mg total) by mouth 2 (two) times daily. 60 tablet 5   albuterol  (PROVENTIL ) (2.5 MG/3ML) 0.083% nebulizer solution Take 3 mLs (2.5 mg total) by nebulization every 6 (six) hours as needed for wheezing or shortness of breath. 75 mL 2   albuterol  (VENTOLIN  HFA) 108 (90 Base) MCG/ACT inhaler Inhale 2 puffs into the lungs every 6 (six) hours as needed for wheezing or shortness of breath. 18 g 2   aspirin  EC 81 MG EC tablet Take 1 tablet (81 mg total) by mouth daily. 30 tablet 12   cetirizine  (ZYRTEC ) 10 MG tablet Take 1 tablet (10 mg total) by mouth daily. 90 tablet 1   ferrous sulfate  325 (65 FE) MG tablet Take 325 mg by mouth daily with breakfast.     furosemide  (LASIX ) 40 MG tablet Take 1 tablet (40 mg total) by mouth daily. 90 tablet 1   losartan  (COZAAR ) 50 MG tablet Take 1 tablet (50 mg total) by mouth daily. 90 tablet 0   metFORMIN  (GLUCOPHAGE ) 500 MG tablet Take 1 tablet (500 mg total) by mouth daily with breakfast. 90 tablet 0   ondansetron  (ZOFRAN ) 8 MG tablet Take 1 tablet (8 mg total) by mouth every 8 (eight) hours as needed for nausea or vomiting. 30 tablet 1   potassium chloride  SA (KLOR-CON  M) 20 MEQ tablet Take 1 tablet (20 mEq total) by mouth daily. 30 tablet 2   prochlorperazine  (COMPAZINE ) 10 MG tablet Take 1 tablet (10 mg total) by mouth every 6 (six) hours as needed for nausea or vomiting 30 tablet 1   rosuvastatin  (CRESTOR ) 20 MG tablet Take 1 tablet (20 mg total)  by mouth daily. 90 tablet 1   Semaglutide -Weight Management (WEGOVY ) 0.5 MG/0.5ML SOAJ Inject 0.5 mg into the skin once a week. 2 mL 0    tiZANidine  (ZANAFLEX ) 4 MG tablet Take 1 tablet (4 mg total) by mouth 2 (two) times daily as needed for muscle spasms. 60 tablet 1   No current facility-administered medications for this visit.    REVIEW OF SYSTEMS:    10 Point review of Systems was done is negative except as noted above.   PHYSICAL EXAMINATION: ECOG PERFORMANCE STATUS: 2 VSS GENERAL:alert, in no acute distress and comfortable SKIN: no acute rashes, no significant lesions EYES: conjunctiva are pink and non-injected, sclera anicteric OROPHARYNX: MMM, no exudates, no oropharyngeal erythema or ulceration NECK: supple, no JVD LYMPH:  no palpable lymphadenopathy in the cervical, axillary or inguinal regions LUNGS: clear to auscultation b/l with normal respiratory effort HEART: regular rate & rhythm ABDOMEN:  normoactive bowel sounds , non tender, not distended. Extremity: no pedal edema PSYCH: alert & oriented x 3 with fluent speech NEURO: no focal motor/sensory deficits   LABORATORY DATA:  I have reviewed the data as listed  .    Latest Ref Rng & Units 06/02/2024    2:13 PM 05/26/2024    9:50 AM 05/19/2024    9:24 AM  CBC  WBC 4.0 - 10.5 K/uL 9.4  7.7  8.5   Hemoglobin 12.0 - 15.0 g/dL 87.5  87.4  87.9   Hematocrit 36.0 - 46.0 % 38.7  38.3  37.4   Platelets 150 - 400 K/uL 261  294  306     .    Latest Ref Rng & Units 06/02/2024    2:13 PM 05/26/2024    9:50 AM 05/19/2024    9:24 AM  CMP  Glucose 70 - 99 mg/dL 98  897  89   BUN 6 - 20 mg/dL 12  9  9    Creatinine 0.44 - 1.00 mg/dL 9.44  9.44  9.43   Sodium 135 - 145 mmol/L 141  141  142   Potassium 3.5 - 5.1 mmol/L 3.5  3.3  3.7   Chloride 98 - 111 mmol/L 105  105  104   CO2 22 - 32 mmol/L 28  28  31    Calcium  8.9 - 10.3 mg/dL 9.4  9.5  9.5   Total Protein 6.5 - 8.1 g/dL 6.7  6.8  6.8   Total Bilirubin 0.0 - 1.2 mg/dL 0.5  0.6  0.6   Alkaline Phos 38 - 126 U/L 44  44  46   AST 15 - 41 U/L 17  11  10    ALT 0 - 44 U/L 19  10  13     02/05/2024 Bone Marrow  Biopsy:       RADIOGRAPHIC STUDIES: I have personally reviewed the radiological images as listed and agreed with the findings in the report. No results found.   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 Patient has a globulin gap and Anemia and bone survey shows multiple bone lesions, Overall presentation is certainly concerning for multiple myeloma. Other paraneoplastic hypercalcemia is also possibility. 25-hydroxy vitamin D  levels are within normal limits at 66. Status post 1 dose of Zometa    #  2 dehydration and volume contraction due to hypercalcemia.   #3  Hypertension #4 dyslipidemia #5 COPD #6 prediabetes #7 multiple electrolyte abnormalities including hypokalemia, hypophosphatemia and severe hypercalcemia. #8 multiple osseous lesions concerning for multiple myeloma.  Also noted to have left femoral intertrochanteric lesion. #9 history of T6 compression fracture was likely related to multiple myeloma.  Mild pain at this time.  PLAN:  -Discussed lab results on 06/02/2024 in detail with patient. CBC normal, showed WBC of 9.4K, hemoglobin of 12.4, and platelets of 261K. -previous anemia has resolved -CMP normal -myeloma panel from today shows improved M spike of 0.3g/dl -her last myeloma panel from early July showed that her M protein had improved from previously greater than 3 g/dL down to 0.4 g/dL -patient is reaching 09% reduction in her M protein -discussed goal to continue to drop M protein as much as we can with her current treatment, then we will discuss possible next steps, including options of maintenance therapy or other treatment -patient has tolerated treatment well with no new or  major toxicity issues -patient is appropriate to proceed with cycle 5 day 1 of treatment today  -discussed that we will continue her current treatment as long as it is effective, she continues to tolerate, and she chooses to -patient has completed her course of post-treatment steroids -her Wegovy  was held by her PCP this morning due to GI discomfort -discussed that Wegovy  can cause mild slowing of the stomach   FOLLOW-UP: Per integrative scheduling Next MD visit in 3-4 weeks  The total time spent in the appointment was 30 minutes* .  All of the patient's questions were answered with apparent satisfaction. The patient knows to call the clinic with any problems, questions or concerns.   Emaline Saran MD MS AAHIVMS [name] Hematology/Oncology Physician [facility]  .*Total Encounter Time as defined by the Centers for Medicare and Medicaid Services includes, in addition to the face-to-face time of a patient visit (documented in the note above) non-face-to-face time: obtaining and reviewing outside history, ordering and reviewing medications, tests or procedures, care coordination (communications with other health care professionals or caregivers) and documentation in the medical record.    I,Mitra Faeizi,acting as a Neurosurgeon for Emaline Saran, MD.,have documented all relevant documentation on the behalf of Emaline Saran, MD,as directed by  Emaline Saran, MD while in the presence of Emaline Saran, MD.  .I have reviewed the above documentation for accuracy and completeness, and I agree with the above. .Mahmood Boehringer Kishore Joslynne Klatt MD

## 2024-06-02 ENCOUNTER — Inpatient Hospital Stay

## 2024-06-02 ENCOUNTER — Other Ambulatory Visit (HOSPITAL_BASED_OUTPATIENT_CLINIC_OR_DEPARTMENT_OTHER): Payer: Self-pay

## 2024-06-02 ENCOUNTER — Inpatient Hospital Stay (HOSPITAL_BASED_OUTPATIENT_CLINIC_OR_DEPARTMENT_OTHER): Admitting: Hematology

## 2024-06-02 ENCOUNTER — Encounter: Payer: Self-pay | Admitting: Hematology

## 2024-06-02 ENCOUNTER — Ambulatory Visit (INDEPENDENT_AMBULATORY_CARE_PROVIDER_SITE_OTHER): Admitting: Family Medicine

## 2024-06-02 ENCOUNTER — Encounter (INDEPENDENT_AMBULATORY_CARE_PROVIDER_SITE_OTHER): Payer: Self-pay | Admitting: Family Medicine

## 2024-06-02 ENCOUNTER — Other Ambulatory Visit (HOSPITAL_COMMUNITY): Payer: Self-pay

## 2024-06-02 VITALS — BP 109/69 | HR 81 | Temp 98.2°F | Resp 18 | Ht 61.0 in | Wt 270.1 lb

## 2024-06-02 VITALS — BP 135/84 | HR 82 | Temp 98.0°F | Ht 61.0 in | Wt 263.0 lb

## 2024-06-02 DIAGNOSIS — I10 Essential (primary) hypertension: Secondary | ICD-10-CM | POA: Diagnosis not present

## 2024-06-02 DIAGNOSIS — C9000 Multiple myeloma not having achieved remission: Secondary | ICD-10-CM

## 2024-06-02 DIAGNOSIS — Z5111 Encounter for antineoplastic chemotherapy: Secondary | ICD-10-CM | POA: Diagnosis not present

## 2024-06-02 DIAGNOSIS — R632 Polyphagia: Secondary | ICD-10-CM | POA: Diagnosis not present

## 2024-06-02 DIAGNOSIS — Z6841 Body Mass Index (BMI) 40.0 and over, adult: Secondary | ICD-10-CM

## 2024-06-02 DIAGNOSIS — E66813 Obesity, class 3: Secondary | ICD-10-CM

## 2024-06-02 DIAGNOSIS — R7303 Prediabetes: Secondary | ICD-10-CM

## 2024-06-02 DIAGNOSIS — E669 Obesity, unspecified: Secondary | ICD-10-CM | POA: Diagnosis not present

## 2024-06-02 LAB — CMP (CANCER CENTER ONLY)
ALT: 19 U/L (ref 0–44)
AST: 17 U/L (ref 15–41)
Albumin: 4.2 g/dL (ref 3.5–5.0)
Alkaline Phosphatase: 44 U/L (ref 38–126)
Anion gap: 8 (ref 5–15)
BUN: 12 mg/dL (ref 6–20)
CO2: 28 mmol/L (ref 22–32)
Calcium: 9.4 mg/dL (ref 8.9–10.3)
Chloride: 105 mmol/L (ref 98–111)
Creatinine: 0.55 mg/dL (ref 0.44–1.00)
GFR, Estimated: >60 mL/min (ref >60)
Glucose, Bld: 98 mg/dL (ref 70–99)
Potassium: 3.5 mmol/L (ref 3.5–5.1)
Sodium: 141 mmol/L (ref 135–145)
Total Bilirubin: 0.5 mg/dL (ref 0.0–1.2)
Total Protein: 6.7 g/dL (ref 6.5–8.1)

## 2024-06-02 LAB — CBC WITH DIFFERENTIAL (CANCER CENTER ONLY)
Abs Immature Granulocytes: 0.03 K/uL (ref 0.00–0.07)
Basophils Absolute: 0.1 K/uL (ref 0.0–0.1)
Basophils Relative: 1 %
Eosinophils Absolute: 0.2 K/uL (ref 0.0–0.5)
Eosinophils Relative: 2 %
HCT: 38.7 % (ref 36.0–46.0)
Hemoglobin: 12.4 g/dL (ref 12.0–15.0)
Immature Granulocytes: 0 %
Lymphocytes Relative: 18 %
Lymphs Abs: 1.7 K/uL (ref 0.7–4.0)
MCH: 27.4 pg (ref 26.0–34.0)
MCHC: 32 g/dL (ref 30.0–36.0)
MCV: 85.4 fL (ref 80.0–100.0)
Monocytes Absolute: 0.6 K/uL (ref 0.1–1.0)
Monocytes Relative: 6 %
Neutro Abs: 6.9 K/uL (ref 1.7–7.7)
Neutrophils Relative %: 73 %
Platelet Count: 261 K/uL (ref 150–400)
RBC: 4.53 MIL/uL (ref 3.87–5.11)
RDW: 15.1 % (ref 11.5–15.5)
WBC Count: 9.4 K/uL (ref 4.0–10.5)
nRBC: 0 % (ref 0.0–0.2)

## 2024-06-02 MED ORDER — METFORMIN HCL 500 MG PO TABS
500.0000 mg | ORAL_TABLET | Freq: Every day | ORAL | 0 refills | Status: DC
  Filled 2024-06-02: qty 90, 90d supply, fill #0

## 2024-06-02 MED ORDER — LOSARTAN POTASSIUM 50 MG PO TABS
50.0000 mg | ORAL_TABLET | Freq: Every day | ORAL | 0 refills | Status: DC
Start: 2024-06-02 — End: 2024-07-19
  Filled 2024-06-02: qty 90, 90d supply, fill #0

## 2024-06-02 MED ORDER — BORTEZOMIB CHEMO SQ INJECTION 3.5 MG (2.5MG/ML)
1.5000 mg/m2 | Freq: Once | INTRAMUSCULAR | Status: AC
  Administered 2024-06-02: 3.75 mg via SUBCUTANEOUS
  Filled 2024-06-02: qty 1.5

## 2024-06-02 MED ORDER — DIPHENHYDRAMINE HCL 25 MG PO CAPS
50.0000 mg | ORAL_CAPSULE | Freq: Once | ORAL | Status: AC
  Administered 2024-06-02: 50 mg via ORAL
  Filled 2024-06-02: qty 2

## 2024-06-02 MED ORDER — ACETAMINOPHEN 325 MG PO TABS
650.0000 mg | ORAL_TABLET | Freq: Once | ORAL | Status: AC
  Administered 2024-06-02: 650 mg via ORAL
  Filled 2024-06-02: qty 2

## 2024-06-02 MED ORDER — DEXAMETHASONE 4 MG PO TABS
20.0000 mg | ORAL_TABLET | Freq: Once | ORAL | Status: AC
Start: 2024-06-02 — End: 2024-06-02
  Administered 2024-06-02: 20 mg via ORAL
  Filled 2024-06-02: qty 5

## 2024-06-02 MED ORDER — FAMOTIDINE 20 MG PO TABS
20.0000 mg | ORAL_TABLET | Freq: Once | ORAL | Status: AC
  Administered 2024-06-02: 20 mg via ORAL
  Filled 2024-06-02: qty 1

## 2024-06-02 MED ORDER — DARATUMUMAB-HYALURONIDASE-FIHJ 1800-30000 MG-UT/15ML ~~LOC~~ SOLN
1800.0000 mg | Freq: Once | SUBCUTANEOUS | Status: AC
  Administered 2024-06-02: 1800 mg via SUBCUTANEOUS
  Filled 2024-06-02: qty 15

## 2024-06-02 NOTE — Progress Notes (Signed)
 Office: [phone]  /  Fax: [phone]  WEIGHT SUMMARY AND BIOMETRICS  Anthropometric Measurements Height: 5' 1 (1.549 m) Weight: 263 lb (119.3 kg) BMI (Calculated): 49.72 Weight at Last Visit: 280 lb Weight Lost Since Last Visit: 17 lb Weight Gained Since Last Visit: 0 Starting Weight: 324 lb Total Weight Loss (lbs): 61 lb (27.7 kg) Peak Weight: 324 lb   Body Composition  Body Fat %: 58.6 % Fat Mass (lbs): 154.4 lbs Muscle Mass (lbs): 103.6 lbs Visceral Fat Rating : 22   Other Clinical Data Fasting: no Labs: no Today's Visit #: 58 Starting Date: 01/03/21    Chief Complaint: OBESITY   History of Present Illness Carla Hanson is a 58 year old female who presents for obesity treatment and progress assessment.  She is adhering to a category two eating plan successfully 90% of the time and has incorporated exercise, including walking and weights, for 20 to 30 minutes two to three times per week. She has lost 17 pounds in the last month. She feels physically stronger and is implementing more activities into her routine, including a walking plan with her mother and using small weights.  She has been cleared to use the gym and enjoys using equipment like the treadmill and a recumbent stepper. She is cautious about her bone health due to concerns about bone cancer and is careful to avoid activities that might lead to fractures.  She is currently taking losartan  for hypertension. Her most recent blood pressure reading was 135/84. She was on Wegovy  but stopped it a week ago due to stomach discomfort, which has since improved. She is also on metformin  for prediabetes and has decided to continue it.  Her diet includes options from the category two plan, such as eggs, bread, Greek yogurt, and lean proteins. She is mindful of her calorie intake, especially with snacks and drinks, and is considering adjustments like using light mayonnaise and mixing juice with  low-calorie beverages to manage her calorie consumption.  No lightheadedness or dizziness. She reports feeling good overall.      PHYSICAL EXAM:  Blood pressure 135/84, pulse 82, temperature 98 F (36.7 C), height 5' 1 (1.549 m), weight 263 lb (119.3 kg), SpO2 97%. Body mass index is 49.69 kg/m.  DIAGNOSTIC DATA REVIEWED:  BMET    Component Value Date/Time   NA 141 05/26/2024 0950   NA 145 (H) 02/24/2024 1217   K 3.3 (L) 05/26/2024 0950   CL 105 05/26/2024 0950   CO2 28 05/26/2024 0950   GLUCOSE 102 (H) 05/26/2024 0950   BUN 9 05/26/2024 0950   BUN 16 02/24/2024 1217   CREATININE 0.55 05/26/2024 0950   CREATININE 0.57 11/09/2014 1032   CALCIUM  9.5 05/26/2024 0950   GFRNONAA >60 05/26/2024 0950   GFRNONAA >89 12/29/2013 0942   GFRAA 127 12/08/2019 1210   GFRAA >89 12/29/2013 0942   Lab Results  Component Value Date   HGBA1C 6.3 (H) 01/29/2024   HGBA1C 6.0 12/31/2012   Lab Results  Component Value Date   INSULIN  35.5 (H) 01/29/2024   INSULIN  77.3 (H) 01/03/2021   Lab Results  Component Value Date   TSH 0.736 01/29/2024   CBC    Component Value Date/Time   WBC 7.7 05/26/2024 0950   WBC 9.0 02/05/2024 0501   RBC 4.60 05/26/2024 0950   HGB 12.5 05/26/2024 0950   HGB 11.3 02/10/2024 1201   HCT 38.3 05/26/2024 0950   HCT 34.7 02/10/2024 1201  PLT 294 05/26/2024 0950   PLT 342 02/10/2024 1201   MCV 83.3 05/26/2024 0950   MCV 84 02/10/2024 1201   MCH 27.2 05/26/2024 0950   MCHC 32.6 05/26/2024 0950   RDW 15.0 05/26/2024 0950   RDW 15.0 02/10/2024 1201   Iron Studies    Component Value Date/Time   IRON 27 02/10/2024 1201   TIBC 245 (L) 02/10/2024 1201   FERRITIN 384 (H) 02/10/2024 1201   IRONPCTSAT 11 (L) 02/10/2024 1201   Lipid Panel     Component Value Date/Time   CHOL 143 02/10/2024 1201   TRIG 113 02/10/2024 1201   HDL 45 02/10/2024 1201   CHOLHDL 3.2 02/10/2024 1201   CHOLHDL 6.3 (H) 01/08/2017 0947   VLDL 40 (H) 01/08/2017 0947    LDLCALC 77 02/10/2024 1201   Hepatic Function Panel     Component Value Date/Time   PROT 6.8 05/26/2024 0950   PROT 8.2 02/24/2024 1217   ALBUMIN 4.3 05/26/2024 0950   ALBUMIN 3.6 (L) 02/24/2024 1217   AST 11 (L) 05/26/2024 0950   ALT 10 05/26/2024 0950   ALKPHOS 44 05/26/2024 0950   BILITOT 0.6 05/26/2024 0950      Component Value Date/Time   TSH 0.736 01/29/2024 1553   Nutritional Lab Results  Component Value Date   VD25OH 66.9 01/29/2024   VD25OH 70.4 07/29/2023   VD25OH 63.8 03/25/2023     Assessment and Plan Assessment & Plan Obesity and polyphagia Obesity management with significant progress. Lost 17 pounds in the last month, exceeding expectations. Current weight loss includes approximately 10 pounds of fat. Following category two eating plan and exercising regularly. Discussed the importance of not losing weight too rapidly to avoid metabolic slowdown. Emphasized the need for a balanced approach to prevent metabolic concerns and maintain long-term weight loss. - Continue category two eating plan with minor adjustments as discussed. - Encourage regular exercise, including walking and use of gym equipment with low impact. - Hold Wegovy  due to gastrointestinal discomfort. Consider reintroduction at a low dose in the future if needed.  Prediabetes Prediabetes management with metformin . Discussed benefits of metformin  in improving insulin  sensitivity, reducing blood sugar levels, and potential cardiovascular benefits. Emphasized the importance of continued lifestyle modifications alongside medication. Discussed study showing metformin  users lived two years longer than non-users starting at age 85, highlighting its potential anti-aging benefits. - Continue metformin  therapy. - Refill metformin  prescription.  Hypertension Hypertension well-controlled with current medication regimen. Blood pressure reading at 135/84 mmHg. No symptoms of lightheadedness or dizziness  reported. - Refill losartan  prescription.     She was informed of the importance of frequent follow up visits to maximize her success with intensive lifestyle modifications for her multiple health conditions.    Louann Penton, MD

## 2024-06-02 NOTE — Patient Instructions (Signed)
 [facility] A DEPT OF [facility]  Discharge Instructions: Thank you for choosing [facility] to provide your oncology and hematology care.   If you have a lab appointment with the Cancer Center, please go directly to the Cancer Center and check in at the registration area.   Wear comfortable clothing and clothing appropriate for easy access to any Portacath or PICC line.   We strive to give you quality time with your provider. You may need to reschedule your appointment if you arrive late (15 or more minutes).  Arriving late affects you and other patients whose appointments are after yours.  Also, if you miss three or more appointments without notifying the office, you may be dismissed from the clinic at the provider's discretion.      For prescription refill requests, have your pharmacy contact our office and allow 72 hours for refills to be completed.    Today you received the following chemotherapy and/or immunotherapy agents Velcade, Darzalex Faspro.    To help prevent nausea and vomiting after your treatment, we encourage you to take your nausea medication as directed.  BELOW ARE SYMPTOMS THAT SHOULD BE REPORTED IMMEDIATELY: *FEVER GREATER THAN 100.4 F (38 C) OR HIGHER *CHILLS OR SWEATING *NAUSEA AND VOMITING THAT IS NOT CONTROLLED WITH YOUR NAUSEA MEDICATION *UNUSUAL SHORTNESS OF BREATH *UNUSUAL BRUISING OR BLEEDING *URINARY PROBLEMS (pain or burning when urinating, or frequent urination) *BOWEL PROBLEMS (unusual diarrhea, constipation, pain near the anus) TENDERNESS IN MOUTH AND THROAT WITH OR WITHOUT PRESENCE OF ULCERS (sore throat, sores in mouth, or a toothache) UNUSUAL RASH, SWELLING OR PAIN  UNUSUAL VAGINAL DISCHARGE OR ITCHING   Items with * indicate a potential emergency and should be followed up as soon as possible or go to the Emergency Department if any problems should occur.  Please show the CHEMOTHERAPY ALERT CARD or  IMMUNOTHERAPY ALERT CARD at check-in to the Emergency Department and triage nurse.  Should you have questions after your visit or need to cancel or reschedule your appointment, please contact [facility]  Dept: [phone]  and follow the prompts.  Office hours are 8:00 a.m. to 4:30 p.m. Monday - Friday. Please note that voicemails left after 4:00 p.m. may not be returned until the following business day.  We are closed weekends and major holidays. You have access to a nurse at all times for urgent questions. Please call the main number to the clinic Dept: [phone] and follow the prompts.   For any non-urgent questions, you may also contact your provider using MyChart. We now offer e-Visits for anyone 86 and older to request care online for non-urgent symptoms. For details visit [email or web address].   Also download the MyChart app! Go to the app store, search "MyChart", open the app, select [place], and log in with your MyChart username and password.

## 2024-06-03 ENCOUNTER — Other Ambulatory Visit (HOSPITAL_COMMUNITY): Payer: Self-pay

## 2024-06-03 ENCOUNTER — Telehealth (INDEPENDENT_AMBULATORY_CARE_PROVIDER_SITE_OTHER): Payer: Self-pay | Admitting: Family Medicine

## 2024-06-03 ENCOUNTER — Other Ambulatory Visit: Payer: Self-pay

## 2024-06-03 ENCOUNTER — Other Ambulatory Visit (HOSPITAL_BASED_OUTPATIENT_CLINIC_OR_DEPARTMENT_OTHER): Payer: Self-pay

## 2024-06-03 DIAGNOSIS — Z7982 Long term (current) use of aspirin: Secondary | ICD-10-CM | POA: Diagnosis not present

## 2024-06-03 DIAGNOSIS — G8929 Other chronic pain: Secondary | ICD-10-CM | POA: Diagnosis not present

## 2024-06-03 DIAGNOSIS — I10 Essential (primary) hypertension: Secondary | ICD-10-CM | POA: Diagnosis not present

## 2024-06-03 DIAGNOSIS — Z6841 Body Mass Index (BMI) 40.0 and over, adult: Secondary | ICD-10-CM | POA: Diagnosis not present

## 2024-06-03 DIAGNOSIS — E860 Dehydration: Secondary | ICD-10-CM | POA: Diagnosis not present

## 2024-06-03 DIAGNOSIS — M25511 Pain in right shoulder: Secondary | ICD-10-CM | POA: Diagnosis not present

## 2024-06-03 DIAGNOSIS — E876 Hypokalemia: Secondary | ICD-10-CM | POA: Diagnosis not present

## 2024-06-03 DIAGNOSIS — R7303 Prediabetes: Secondary | ICD-10-CM | POA: Diagnosis not present

## 2024-06-03 DIAGNOSIS — F319 Bipolar disorder, unspecified: Secondary | ICD-10-CM | POA: Diagnosis not present

## 2024-06-03 DIAGNOSIS — E785 Hyperlipidemia, unspecified: Secondary | ICD-10-CM | POA: Diagnosis not present

## 2024-06-03 DIAGNOSIS — S22000D Wedge compression fracture of unspecified thoracic vertebra, subsequent encounter for fracture with routine healing: Secondary | ICD-10-CM | POA: Diagnosis not present

## 2024-06-03 DIAGNOSIS — Z7984 Long term (current) use of oral hypoglycemic drugs: Secondary | ICD-10-CM | POA: Diagnosis not present

## 2024-06-03 DIAGNOSIS — S72142D Displaced intertrochanteric fracture of left femur, subsequent encounter for closed fracture with routine healing: Secondary | ICD-10-CM | POA: Diagnosis not present

## 2024-06-03 DIAGNOSIS — M1711 Unilateral primary osteoarthritis, right knee: Secondary | ICD-10-CM | POA: Diagnosis not present

## 2024-06-03 DIAGNOSIS — Z86718 Personal history of other venous thrombosis and embolism: Secondary | ICD-10-CM | POA: Diagnosis not present

## 2024-06-03 DIAGNOSIS — Z8673 Personal history of transient ischemic attack (TIA), and cerebral infarction without residual deficits: Secondary | ICD-10-CM | POA: Diagnosis not present

## 2024-06-03 DIAGNOSIS — J449 Chronic obstructive pulmonary disease, unspecified: Secondary | ICD-10-CM | POA: Diagnosis not present

## 2024-06-03 DIAGNOSIS — M25512 Pain in left shoulder: Secondary | ICD-10-CM | POA: Diagnosis not present

## 2024-06-03 DIAGNOSIS — D509 Iron deficiency anemia, unspecified: Secondary | ICD-10-CM | POA: Diagnosis not present

## 2024-06-03 DIAGNOSIS — Z86711 Personal history of pulmonary embolism: Secondary | ICD-10-CM | POA: Diagnosis not present

## 2024-06-03 DIAGNOSIS — F419 Anxiety disorder, unspecified: Secondary | ICD-10-CM | POA: Diagnosis not present

## 2024-06-03 DIAGNOSIS — C9000 Multiple myeloma not having achieved remission: Secondary | ICD-10-CM | POA: Diagnosis not present

## 2024-06-03 LAB — KAPPA/LAMBDA LIGHT CHAINS
Kappa free light chain: 5.6 mg/L (ref 3.3–19.4)
Kappa, lambda light chain ratio: 1.37 (ref 0.26–1.65)
Lambda free light chains: 4.1 mg/L — ABNORMAL LOW (ref 5.7–26.3)

## 2024-06-03 NOTE — Telephone Encounter (Signed)
 7/31 pt forgot her Category 1 meal plan at yesterdays appt, she is wondering if someone can email it to her

## 2024-06-04 LAB — MULTIPLE MYELOMA PANEL, SERUM
Albumin SerPl Elph-Mcnc: 3.7 g/dL (ref 2.9–4.4)
Albumin/Glob SerPl: 1.6 (ref 0.7–1.7)
Alpha 1: 0.2 g/dL (ref 0.0–0.4)
Alpha2 Glob SerPl Elph-Mcnc: 0.8 g/dL (ref 0.4–1.0)
B-Globulin SerPl Elph-Mcnc: 0.9 g/dL (ref 0.7–1.3)
Gamma Glob SerPl Elph-Mcnc: 0.5 g/dL (ref 0.4–1.8)
Globulin, Total: 2.4 g/dL (ref 2.2–3.9)
IgA: 14 mg/dL — ABNORMAL LOW (ref 87–352)
IgG (Immunoglobin G), Serum: 625 mg/dL (ref 586–1602)
IgM (Immunoglobulin M), Srm: 17 mg/dL — ABNORMAL LOW (ref 26–217)
M Protein SerPl Elph-Mcnc: 0.3 g/dL — ABNORMAL HIGH
Total Protein ELP: 6.1 g/dL (ref 6.0–8.5)

## 2024-06-09 ENCOUNTER — Other Ambulatory Visit (HOSPITAL_COMMUNITY): Payer: Self-pay

## 2024-06-09 ENCOUNTER — Encounter: Payer: Self-pay | Admitting: Hematology

## 2024-06-09 ENCOUNTER — Inpatient Hospital Stay: Attending: Hematology

## 2024-06-09 ENCOUNTER — Other Ambulatory Visit: Payer: Self-pay

## 2024-06-09 ENCOUNTER — Other Ambulatory Visit

## 2024-06-09 ENCOUNTER — Ambulatory Visit

## 2024-06-09 ENCOUNTER — Ambulatory Visit: Admitting: Hematology

## 2024-06-09 ENCOUNTER — Inpatient Hospital Stay

## 2024-06-09 VITALS — BP 149/76 | HR 91 | Temp 97.7°F | Resp 18 | Wt 266.8 lb

## 2024-06-09 DIAGNOSIS — Z5111 Encounter for antineoplastic chemotherapy: Secondary | ICD-10-CM | POA: Diagnosis present

## 2024-06-09 DIAGNOSIS — Z5112 Encounter for antineoplastic immunotherapy: Secondary | ICD-10-CM | POA: Diagnosis not present

## 2024-06-09 DIAGNOSIS — C9000 Multiple myeloma not having achieved remission: Secondary | ICD-10-CM

## 2024-06-09 LAB — CBC WITH DIFFERENTIAL (CANCER CENTER ONLY)
Abs Immature Granulocytes: 0.02 K/uL (ref 0.00–0.07)
Basophils Absolute: 0 K/uL (ref 0.0–0.1)
Basophils Relative: 0 %
Eosinophils Absolute: 0.1 K/uL (ref 0.0–0.5)
Eosinophils Relative: 1 %
HCT: 35.8 % — ABNORMAL LOW (ref 36.0–46.0)
Hemoglobin: 11.7 g/dL — ABNORMAL LOW (ref 12.0–15.0)
Immature Granulocytes: 0 %
Lymphocytes Relative: 21 %
Lymphs Abs: 1.9 K/uL (ref 0.7–4.0)
MCH: 27.2 pg (ref 26.0–34.0)
MCHC: 32.7 g/dL (ref 30.0–36.0)
MCV: 83.3 fL (ref 80.0–100.0)
Monocytes Absolute: 0.5 K/uL (ref 0.1–1.0)
Monocytes Relative: 6 %
Neutro Abs: 6.3 K/uL (ref 1.7–7.7)
Neutrophils Relative %: 72 %
Platelet Count: 253 K/uL (ref 150–400)
RBC: 4.3 MIL/uL (ref 3.87–5.11)
RDW: 15.3 % (ref 11.5–15.5)
WBC Count: 8.8 K/uL (ref 4.0–10.5)
nRBC: 0 % (ref 0.0–0.2)

## 2024-06-09 LAB — CMP (CANCER CENTER ONLY)
ALT: 20 U/L (ref 0–44)
AST: 16 U/L (ref 15–41)
Albumin: 4.2 g/dL (ref 3.5–5.0)
Alkaline Phosphatase: 45 U/L (ref 38–126)
Anion gap: 7 (ref 5–15)
BUN: 9 mg/dL (ref 6–20)
CO2: 28 mmol/L (ref 22–32)
Calcium: 9.1 mg/dL (ref 8.9–10.3)
Chloride: 106 mmol/L (ref 98–111)
Creatinine: 0.51 mg/dL (ref 0.44–1.00)
GFR, Estimated: >60 mL/min (ref >60)
Glucose, Bld: 106 mg/dL — ABNORMAL HIGH (ref 70–99)
Potassium: 3.7 mmol/L (ref 3.5–5.1)
Sodium: 141 mmol/L (ref 135–145)
Total Bilirubin: 0.4 mg/dL (ref 0.0–1.2)
Total Protein: 6.4 g/dL — ABNORMAL LOW (ref 6.5–8.1)

## 2024-06-09 MED ORDER — BORTEZOMIB CHEMO SQ INJECTION 3.5 MG (2.5MG/ML)
1.5000 mg/m2 | Freq: Once | INTRAMUSCULAR | Status: AC
  Administered 2024-06-09: 3.75 mg via SUBCUTANEOUS
  Filled 2024-06-09: qty 1.5

## 2024-06-09 MED ORDER — DEXAMETHASONE 4 MG PO TABS
20.0000 mg | ORAL_TABLET | Freq: Once | ORAL | Status: AC
  Administered 2024-06-09: 20 mg via ORAL
  Filled 2024-06-09: qty 5

## 2024-06-09 MED ORDER — FAMOTIDINE 20 MG PO TABS
20.0000 mg | ORAL_TABLET | Freq: Once | ORAL | Status: AC
  Administered 2024-06-09: 20 mg via ORAL
  Filled 2024-06-09: qty 1

## 2024-06-09 MED ORDER — DARATUMUMAB-HYALURONIDASE-FIHJ 1800-30000 MG-UT/15ML ~~LOC~~ SOLN
1800.0000 mg | Freq: Once | SUBCUTANEOUS | Status: AC
  Administered 2024-06-09: 1800 mg via SUBCUTANEOUS
  Filled 2024-06-09: qty 15

## 2024-06-09 MED ORDER — DIPHENHYDRAMINE HCL 25 MG PO CAPS
50.0000 mg | ORAL_CAPSULE | Freq: Once | ORAL | Status: AC
  Administered 2024-06-09: 50 mg via ORAL
  Filled 2024-06-09: qty 2

## 2024-06-09 MED ORDER — ACETAMINOPHEN 325 MG PO TABS
650.0000 mg | ORAL_TABLET | Freq: Once | ORAL | Status: AC
  Administered 2024-06-09: 650 mg via ORAL
  Filled 2024-06-09: qty 2

## 2024-06-09 NOTE — Patient Instructions (Signed)
 [facility] A DEPT OF [facility]  Discharge Instructions: Thank you for choosing [facility] to provide your oncology and hematology care.   If you have a lab appointment with the Cancer Center, please go directly to the Cancer Center and check in at the registration area.   Wear comfortable clothing and clothing appropriate for easy access to any Portacath or PICC line.   We strive to give you quality time with your provider. You may need to reschedule your appointment if you arrive late (15 or more minutes).  Arriving late affects you and other patients whose appointments are after yours.  Also, if you miss three or more appointments without notifying the office, you may be dismissed from the clinic at the provider's discretion.      For prescription refill requests, have your pharmacy contact our office and allow 72 hours for refills to be completed.    Today you received the following chemotherapy and/or immunotherapy agents Velcade, Darzalex Faspro.    To help prevent nausea and vomiting after your treatment, we encourage you to take your nausea medication as directed.  BELOW ARE SYMPTOMS THAT SHOULD BE REPORTED IMMEDIATELY: *FEVER GREATER THAN 100.4 F (38 C) OR HIGHER *CHILLS OR SWEATING *NAUSEA AND VOMITING THAT IS NOT CONTROLLED WITH YOUR NAUSEA MEDICATION *UNUSUAL SHORTNESS OF BREATH *UNUSUAL BRUISING OR BLEEDING *URINARY PROBLEMS (pain or burning when urinating, or frequent urination) *BOWEL PROBLEMS (unusual diarrhea, constipation, pain near the anus) TENDERNESS IN MOUTH AND THROAT WITH OR WITHOUT PRESENCE OF ULCERS (sore throat, sores in mouth, or a toothache) UNUSUAL RASH, SWELLING OR PAIN  UNUSUAL VAGINAL DISCHARGE OR ITCHING   Items with * indicate a potential emergency and should be followed up as soon as possible or go to the Emergency Department if any problems should occur.  Please show the CHEMOTHERAPY ALERT CARD or  IMMUNOTHERAPY ALERT CARD at check-in to the Emergency Department and triage nurse.  Should you have questions after your visit or need to cancel or reschedule your appointment, please contact [facility]  Dept: [phone]  and follow the prompts.  Office hours are 8:00 a.m. to 4:30 p.m. Monday - Friday. Please note that voicemails left after 4:00 p.m. may not be returned until the following business day.  We are closed weekends and major holidays. You have access to a nurse at all times for urgent questions. Please call the main number to the clinic Dept: [phone] and follow the prompts.   For any non-urgent questions, you may also contact your provider using MyChart. We now offer e-Visits for anyone 86 and older to request care online for non-urgent symptoms. For details visit [email or web address].   Also download the MyChart app! Go to the app store, search "MyChart", open the app, select [place], and log in with your MyChart username and password.

## 2024-06-10 ENCOUNTER — Other Ambulatory Visit (HOSPITAL_COMMUNITY): Payer: Self-pay

## 2024-06-16 ENCOUNTER — Telehealth: Payer: Self-pay

## 2024-06-16 DIAGNOSIS — C9000 Multiple myeloma not having achieved remission: Secondary | ICD-10-CM

## 2024-06-16 NOTE — Progress Notes (Signed)
 Complex Care Management Note Care Guide Note  06/16/2024 Name: Carla Hanson MRN: [id number] DOB: 01/03/1966   Complex Care Management Outreach Attempts: An unsuccessful telephone outreach was attempted today to offer the patient information about available complex care management services.  Follow Up Plan:  Additional outreach attempts will be made to offer the patient complex care management information and services.   Encounter Outcome:  No Answer  D[facility]  [facility], [facility] Care Management Assistant Direct Dial: [phone]  Fax: [phone]

## 2024-06-17 ENCOUNTER — Inpatient Hospital Stay

## 2024-06-17 VITALS — BP 129/73 | HR 82 | Temp 98.4°F | Resp 18 | Wt 265.0 lb

## 2024-06-17 DIAGNOSIS — Z5111 Encounter for antineoplastic chemotherapy: Secondary | ICD-10-CM | POA: Diagnosis not present

## 2024-06-17 DIAGNOSIS — C9000 Multiple myeloma not having achieved remission: Secondary | ICD-10-CM

## 2024-06-17 LAB — CBC WITH DIFFERENTIAL (CANCER CENTER ONLY)
Abs Immature Granulocytes: 0.03 K/uL (ref 0.00–0.07)
Basophils Absolute: 0 K/uL (ref 0.0–0.1)
Basophils Relative: 0 %
Eosinophils Absolute: 0.1 K/uL (ref 0.0–0.5)
Eosinophils Relative: 1 %
HCT: 39.6 % (ref 36.0–46.0)
Hemoglobin: 12.9 g/dL (ref 12.0–15.0)
Immature Granulocytes: 0 %
Lymphocytes Relative: 21 %
Lymphs Abs: 1.9 K/uL (ref 0.7–4.0)
MCH: 27.3 pg (ref 26.0–34.0)
MCHC: 32.6 g/dL (ref 30.0–36.0)
MCV: 83.9 fL (ref 80.0–100.0)
Monocytes Absolute: 0.6 K/uL (ref 0.1–1.0)
Monocytes Relative: 7 %
Neutro Abs: 6.4 K/uL (ref 1.7–7.7)
Neutrophils Relative %: 71 %
Platelet Count: 283 K/uL (ref 150–400)
RBC: 4.72 MIL/uL (ref 3.87–5.11)
RDW: 15 % (ref 11.5–15.5)
WBC Count: 9.1 K/uL (ref 4.0–10.5)
nRBC: 0 % (ref 0.0–0.2)

## 2024-06-17 MED ORDER — ZOLEDRONIC ACID 4 MG/100ML IV SOLN
4.0000 mg | Freq: Once | INTRAVENOUS | Status: AC
  Administered 2024-06-17: 4 mg via INTRAVENOUS
  Filled 2024-06-17: qty 100

## 2024-06-17 MED ORDER — BORTEZOMIB CHEMO SQ INJECTION 3.5 MG (2.5MG/ML)
1.5000 mg/m2 | Freq: Once | INTRAMUSCULAR | Status: AC
  Administered 2024-06-17: 3.75 mg via SUBCUTANEOUS
  Filled 2024-06-17: qty 1.5

## 2024-06-17 MED ORDER — SODIUM CHLORIDE 0.9 % IV SOLN: INTRAVENOUS | Status: DC

## 2024-06-17 MED ORDER — DEXAMETHASONE 4 MG PO TABS
20.0000 mg | ORAL_TABLET | Freq: Once | ORAL | Status: AC
  Administered 2024-06-17: 20 mg via ORAL
  Filled 2024-06-17: qty 5

## 2024-06-17 MED ORDER — FAMOTIDINE 20 MG PO TABS
20.0000 mg | ORAL_TABLET | Freq: Once | ORAL | Status: AC
  Administered 2024-06-17: 20 mg via ORAL
  Filled 2024-06-17: qty 1

## 2024-06-17 MED ORDER — ACETAMINOPHEN 325 MG PO TABS
650.0000 mg | ORAL_TABLET | Freq: Once | ORAL | Status: AC
  Administered 2024-06-17: 650 mg via ORAL
  Filled 2024-06-17: qty 2

## 2024-06-17 MED ORDER — DIPHENHYDRAMINE HCL 25 MG PO CAPS
50.0000 mg | ORAL_CAPSULE | Freq: Once | ORAL | Status: AC
  Administered 2024-06-17: 50 mg via ORAL
  Filled 2024-06-17: qty 2

## 2024-06-17 MED ORDER — DARATUMUMAB-HYALURONIDASE-FIHJ 1800-30000 MG-UT/15ML ~~LOC~~ SOLN
1800.0000 mg | Freq: Once | SUBCUTANEOUS | Status: AC
  Administered 2024-06-17: 1800 mg via SUBCUTANEOUS
  Filled 2024-06-17: qty 15

## 2024-06-17 NOTE — Patient Instructions (Addendum)
 [facility] - A DEPT OF . [facility]  Discharge Instructions: Thank you for choosing [facility] to provide your oncology and hematology care.   If you have a lab appointment with the Cancer Center, please go directly to the Cancer Center and check in at the registration area.   Wear comfortable clothing and clothing appropriate for easy access to any Portacath or PICC line.   We strive to give you quality time with your provider. You may need to reschedule your appointment if you arrive late (15 or more minutes).  Arriving late affects you and other patients whose appointments are after yours.  Also, if you miss three or more appointments without notifying the office, you may be dismissed from the clinic at the provider's discretion.      For prescription refill requests, have your pharmacy contact our office and allow 72 hours for refills to be completed.    Today you received the following chemotherapy and/or immunotherapy agents: Velcade , Darzalex  Faspro      To help prevent nausea and vomiting after your treatment, we encourage you to take your nausea medication as directed.  BELOW ARE SYMPTOMS THAT SHOULD BE REPORTED IMMEDIATELY: *FEVER GREATER THAN 100.4 F (38 C) OR HIGHER *CHILLS OR SWEATING *NAUSEA AND VOMITING THAT IS NOT CONTROLLED WITH YOUR NAUSEA MEDICATION *UNUSUAL SHORTNESS OF BREATH *UNUSUAL BRUISING OR BLEEDING *URINARY PROBLEMS (pain or burning when urinating, or frequent urination) *BOWEL PROBLEMS (unusual diarrhea, constipation, pain near the anus) TENDERNESS IN MOUTH AND THROAT WITH OR WITHOUT PRESENCE OF ULCERS (sore throat, sores in mouth, or a toothache) UNUSUAL RASH, SWELLING OR PAIN  UNUSUAL VAGINAL DISCHARGE OR ITCHING   Items with * indicate a potential emergency and should be followed up as soon as possible or go to the Emergency Department if any problems should occur.  Please show the CHEMOTHERAPY ALERT CARD or  IMMUNOTHERAPY ALERT CARD at check-in to the Emergency Department and triage nurse.  Should you have questions after your visit or need to cancel or reschedule your appointment, please contact [facility] - A DEPT OF [facility]  Dept: [phone]  and follow the prompts.  Office hours are 8:00 a.m. to 4:30 p.m. Monday - Friday. Please note that voicemails left after 4:00 p.m. may not be returned until the following business day.  We are closed weekends and major holidays. You have access to a nurse at all times for urgent questions. Please call the main number to the clinic Dept: [phone] and follow the prompts.   For any non-urgent questions, you may also contact your provider using MyChart. We now offer e-Visits for anyone 51 and older to request care online for non-urgent symptoms. For details visit [email or web address].   Also download the MyChart app! Go to the app store, search "MyChart", open the app, select [place], and log in with your MyChart username and password.  Zoledronic  Acid Injection (Cancer) What is this medication? ZOLEDRONIC  ACID (ZOE le dron ik AS id) treats high calcium  levels in the blood caused by cancer. It may also be used with chemotherapy to treat weakened bones caused by cancer. It works by slowing down the release of calcium  from bones. This lowers calcium  levels in your blood. It also makes your bones stronger and less likely to break (fracture). It belongs to a group of medications called bisphosphonates. This medicine may be used for other purposes; ask your health  care provider or pharmacist if you have questions. COMMON BRAND NAME(S): Zometa , Zometa  Powder What should I tell my care team before I take this medication? They need to know if you have any of these conditions: Dehydration Dental disease Kidney disease Liver disease Low levels of calcium  in the blood Lung or breathing disease, such as asthma Receiving  steroids, such as dexamethasone  or prednisone An unusual or allergic reaction to zoledronic  acid, other medications, foods, dyes, or preservatives Pregnant or trying to get pregnant Breast-feeding How should I use this medication? This medication is injected into a vein. It is given by your care team in a hospital or clinic setting. Talk to your care team about the use of this medication in children. Special care may be needed. Overdosage: If you think you have taken too much of this medicine contact a poison control center or emergency room at once. NOTE: This medicine is only for you. Do not share this medicine with others. What if I miss a dose? Keep appointments for follow-up doses. It is important not to miss your dose. Call your care team if you are unable to keep an appointment. What may interact with this medication? Certain antibiotics given by injection Diuretics, such as bumetanide, furosemide NSAIDs, medications for pain and inflammation, such as ibuprofen or naproxen Teriparatide Thalidomide This list may not describe all possible interactions. Give your health care provider a list of all the medicines, herbs, non-prescription drugs, or dietary supplements you use. Also tell them if you smoke, drink alcohol, or use illegal drugs. Some items may interact with your medicine. What should I watch for while using this medication? Visit your care team for regular checks on your progress. It may be some time before you see the benefit from this medication. Some people who take this medication have severe bone, joint, or muscle pain. This medication may also increase your risk for jaw problems or a broken thigh bone. Tell your care team right away if you have severe pain in your jaw, bones, joints, or muscles. Tell you care team if you have any pain that does not go away or that gets worse. Tell your dentist and dental surgeon that you are taking this medication. You should not have major  dental surgery while on this medication. See your dentist to have a dental exam and fix any dental problems before starting this medication. Take good care of your teeth while on this medication. Make sure you see your dentist for regular follow-up appointments. You should make sure you get enough calcium  and vitamin D  while you are taking this medication. Discuss the foods you eat and the vitamins you take with your care team. Check with your care team if you have severe diarrhea, nausea, and vomiting, or if you sweat a lot. The loss of too much body fluid may make it dangerous for you to take this medication. You may need bloodwork while taking this medication. Talk to your care team if you wish to become pregnant or think you might be pregnant. This medication can cause serious birth defects. What side effects may I notice from receiving this medication? Side effects that you should report to your care team as soon as possible: Allergic reactions--skin rash, itching, hives, swelling of the face, lips, tongue, or throat Kidney injury--decrease in the amount of urine, swelling of the ankles, hands, or feet Low calcium  level--muscle pain or cramps, confusion, tingling, or numbness in the hands or feet Osteonecrosis of the jaw--pain, swelling,  or redness in the mouth, numbness of the jaw, poor healing after dental work, unusual discharge from the mouth, visible bones in the mouth Severe bone, joint, or muscle pain Side effects that usually do not require medical attention (report to your care team if they continue or are bothersome): Constipation Fatigue Fever Loss of appetite Nausea Stomach pain This list may not describe all possible side effects. Call your doctor for medical advice about side effects. You may report side effects to FDA at 1-800-FDA-1088. Where should I keep my medication? This medication is given in a hospital or clinic. It will not be stored at home. NOTE: This sheet is a  summary. It may not cover all possible information. If you have questions about this medicine, talk to your doctor, pharmacist, or health care provider.  2024 Elsevier/Gold Standard (2021-12-14 00:00:00)

## 2024-06-17 NOTE — Progress Notes (Signed)
 Ok to proceed with Zometa  and Velcade  today using CMP from 8/6 per Dr. Federico.  Jasiyah Paulding, PharmD, MBA

## 2024-06-23 ENCOUNTER — Inpatient Hospital Stay

## 2024-06-23 ENCOUNTER — Encounter: Payer: Self-pay | Admitting: Hematology

## 2024-06-23 ENCOUNTER — Inpatient Hospital Stay (HOSPITAL_BASED_OUTPATIENT_CLINIC_OR_DEPARTMENT_OTHER): Admitting: Hematology

## 2024-06-23 VITALS — BP 165/80 | HR 91 | Temp 98.1°F | Resp 20 | Wt 263.5 lb

## 2024-06-23 DIAGNOSIS — C9000 Multiple myeloma not having achieved remission: Secondary | ICD-10-CM

## 2024-06-23 DIAGNOSIS — Z5111 Encounter for antineoplastic chemotherapy: Secondary | ICD-10-CM

## 2024-06-23 LAB — CMP (CANCER CENTER ONLY)
ALT: 22 U/L (ref 0–44)
AST: 16 U/L (ref 15–41)
Albumin: 4.3 g/dL (ref 3.5–5.0)
Alkaline Phosphatase: 44 U/L (ref 38–126)
Anion gap: 8 (ref 5–15)
BUN: 11 mg/dL (ref 6–20)
CO2: 28 mmol/L (ref 22–32)
Calcium: 9.8 mg/dL (ref 8.9–10.3)
Chloride: 104 mmol/L (ref 98–111)
Creatinine: 0.46 mg/dL (ref 0.44–1.00)
GFR, Estimated: >60 mL/min (ref >60)
Glucose, Bld: 112 mg/dL — ABNORMAL HIGH (ref 70–99)
Potassium: 3.1 mmol/L — ABNORMAL LOW (ref 3.5–5.1)
Sodium: 140 mmol/L (ref 135–145)
Total Bilirubin: 0.5 mg/dL (ref 0.0–1.2)
Total Protein: 6.4 g/dL — ABNORMAL LOW (ref 6.5–8.1)

## 2024-06-23 LAB — CBC WITH DIFFERENTIAL (CANCER CENTER ONLY)
Abs Immature Granulocytes: 0.03 K/uL (ref 0.00–0.07)
Basophils Absolute: 0.1 K/uL (ref 0.0–0.1)
Basophils Relative: 1 %
Eosinophils Absolute: 0.1 K/uL (ref 0.0–0.5)
Eosinophils Relative: 1 %
HCT: 39.4 % (ref 36.0–46.0)
Hemoglobin: 12.7 g/dL (ref 12.0–15.0)
Immature Granulocytes: 0 %
Lymphocytes Relative: 19 %
Lymphs Abs: 2 K/uL (ref 0.7–4.0)
MCH: 26.8 pg (ref 26.0–34.0)
MCHC: 32.2 g/dL (ref 30.0–36.0)
MCV: 83.3 fL (ref 80.0–100.0)
Monocytes Absolute: 0.6 K/uL (ref 0.1–1.0)
Monocytes Relative: 6 %
Neutro Abs: 7.9 K/uL — ABNORMAL HIGH (ref 1.7–7.7)
Neutrophils Relative %: 73 %
Platelet Count: 238 K/uL (ref 150–400)
RBC: 4.73 MIL/uL (ref 3.87–5.11)
RDW: 14.9 % (ref 11.5–15.5)
WBC Count: 10.7 K/uL — ABNORMAL HIGH (ref 4.0–10.5)
nRBC: 0 % (ref 0.0–0.2)

## 2024-06-23 MED ORDER — DARATUMUMAB-HYALURONIDASE-FIHJ 1800-30000 MG-UT/15ML ~~LOC~~ SOLN
1800.0000 mg | Freq: Once | SUBCUTANEOUS | Status: AC
  Administered 2024-06-23: 1800 mg via SUBCUTANEOUS
  Filled 2024-06-23: qty 15

## 2024-06-23 MED ORDER — DEXAMETHASONE 4 MG PO TABS
20.0000 mg | ORAL_TABLET | Freq: Once | ORAL | Status: AC
  Administered 2024-06-23: 20 mg via ORAL
  Filled 2024-06-23: qty 5

## 2024-06-23 MED ORDER — DIPHENHYDRAMINE HCL 25 MG PO CAPS
50.0000 mg | ORAL_CAPSULE | Freq: Once | ORAL | Status: AC
  Administered 2024-06-23: 50 mg via ORAL
  Filled 2024-06-23: qty 2

## 2024-06-23 MED ORDER — FAMOTIDINE 20 MG PO TABS
20.0000 mg | ORAL_TABLET | Freq: Once | ORAL | Status: AC
  Administered 2024-06-23: 20 mg via ORAL
  Filled 2024-06-23: qty 1

## 2024-06-23 MED ORDER — BORTEZOMIB CHEMO SQ INJECTION 3.5 MG (2.5MG/ML)
1.5000 mg/m2 | Freq: Once | INTRAMUSCULAR | Status: AC
  Administered 2024-06-23: 3.75 mg via SUBCUTANEOUS
  Filled 2024-06-23: qty 1.5

## 2024-06-23 MED ORDER — ACETAMINOPHEN 325 MG PO TABS
650.0000 mg | ORAL_TABLET | Freq: Once | ORAL | Status: AC
  Administered 2024-06-23: 650 mg via ORAL
  Filled 2024-06-23: qty 2

## 2024-06-23 NOTE — Patient Instructions (Signed)
 [facility] - A DEPT OF [facility]  Discharge Instructions: Thank you for choosing [facility] to provide your oncology and hematology care.   If you have a lab appointment with the Cancer Center, please go directly to the Cancer Center and check in at the registration area.   Wear comfortable clothing and clothing appropriate for easy access to any Portacath or PICC line.   We strive to give you quality time with your provider. You may need to reschedule your appointment if you arrive late (15 or more minutes).  Arriving late affects you and other patients whose appointments are after yours.  Also, if you miss three or more appointments without notifying the office, you may be dismissed from the clinic at the provider's discretion.      For prescription refill requests, have your pharmacy contact our office and allow 72 hours for refills to be completed.    Today you received the following chemotherapy and/or immunotherapy agents :  Bortezomib  & Daratumumab       To help prevent nausea and vomiting after your treatment, we encourage you to take your nausea medication as directed.  BELOW ARE SYMPTOMS THAT SHOULD BE REPORTED IMMEDIATELY: *FEVER GREATER THAN 100.4 F (38 C) OR HIGHER *CHILLS OR SWEATING *NAUSEA AND VOMITING THAT IS NOT CONTROLLED WITH YOUR NAUSEA MEDICATION *UNUSUAL SHORTNESS OF BREATH *UNUSUAL BRUISING OR BLEEDING *URINARY PROBLEMS (pain or burning when urinating, or frequent urination) *BOWEL PROBLEMS (unusual diarrhea, constipation, pain near the anus) TENDERNESS IN MOUTH AND THROAT WITH OR WITHOUT PRESENCE OF ULCERS (sore throat, sores in mouth, or a toothache) UNUSUAL RASH, SWELLING OR PAIN  UNUSUAL VAGINAL DISCHARGE OR ITCHING   Items with * indicate a potential emergency and should be followed up as soon as possible or go to the Emergency Department if any problems should occur.  Please show the CHEMOTHERAPY ALERT CARD  or IMMUNOTHERAPY ALERT CARD at check-in to the Emergency Department and triage nurse.  Should you have questions after your visit or need to cancel or reschedule your appointment, please contact [facility]  Dept: [phone]  and follow the prompts.  Office hours are 8:00 a.m. to 4:30 p.m. Monday - Friday. Please note that voicemails left after 4:00 p.m. may not be returned until the following business day.  We are closed weekends and major holidays. You have access to a nurse at all times for urgent questions. Please call the main number to the clinic Dept: [phone] and follow the prompts.   For any non-urgent questions, you may also contact your provider using MyChart. We now offer e-Visits for anyone 71 and older to request care online for non-urgent symptoms. For details visit [email or web address].   Also download the MyChart app! Go to the app store, search MyChart, open the app, select , and log in with your MyChart username and password.

## 2024-06-23 NOTE — Progress Notes (Signed)
 Per Dr. Onesimo, ok to keep Velcade  dose the same for today's treatment (patient has had recent weight loss). They are no longer taking Wegovy  but will monitor.   Alfonso MARLA Buys, PharmD Pharmacy Resident  06/23/2024 12:06 PM

## 2024-06-24 ENCOUNTER — Ambulatory Visit (INDEPENDENT_AMBULATORY_CARE_PROVIDER_SITE_OTHER): Admitting: Family Medicine

## 2024-06-24 ENCOUNTER — Encounter (INDEPENDENT_AMBULATORY_CARE_PROVIDER_SITE_OTHER): Payer: Self-pay | Admitting: Family Medicine

## 2024-06-24 VITALS — BP 137/83 | HR 84 | Temp 98.2°F | Ht 61.5 in | Wt 259.0 lb

## 2024-06-24 DIAGNOSIS — R110 Nausea: Secondary | ICD-10-CM | POA: Diagnosis not present

## 2024-06-24 DIAGNOSIS — R5383 Other fatigue: Secondary | ICD-10-CM | POA: Diagnosis not present

## 2024-06-24 DIAGNOSIS — Z6841 Body Mass Index (BMI) 40.0 and over, adult: Secondary | ICD-10-CM

## 2024-06-24 DIAGNOSIS — E661 Drug-induced obesity: Secondary | ICD-10-CM

## 2024-06-24 DIAGNOSIS — T451X5A Adverse effect of antineoplastic and immunosuppressive drugs, initial encounter: Secondary | ICD-10-CM | POA: Diagnosis not present

## 2024-06-24 DIAGNOSIS — K521 Toxic gastroenteritis and colitis: Secondary | ICD-10-CM

## 2024-06-24 DIAGNOSIS — R7303 Prediabetes: Secondary | ICD-10-CM | POA: Diagnosis not present

## 2024-06-24 LAB — KAPPA/LAMBDA LIGHT CHAINS
Kappa free light chain: 3.9 mg/L (ref 3.3–19.4)
Kappa, lambda light chain ratio: 1.26 (ref 0.26–1.65)
Lambda free light chains: 3.1 mg/L — ABNORMAL LOW (ref 5.7–26.3)

## 2024-06-24 NOTE — Progress Notes (Signed)
 Office: [phone]  /  Fax: [phone]  WEIGHT SUMMARY AND BIOMETRICS  Anthropometric Measurements Height: 5' 1.5 (1.562 m) (rechecked height today) Weight: 259 lb (117.5 kg) BMI (Calculated): 48.15 Weight at Last Visit: 263 lb Weight Lost Since Last Visit: 4 lb Weight Gained Since Last Visit: 0 Starting Weight: 324 lb Total Weight Loss (lbs): 65 lb (29.5 kg) Peak Weight: 324 lb   Body Composition  Body Fat %: 59.1 % Fat Mass (lbs): 153.6 lbs Muscle Mass (lbs): 100.8 lbs Visceral Fat Rating : 22   Other Clinical Data Fasting: yes Labs: no Today's Visit #: 40 Starting Date: 01/03/21 Comments: rechecked height today    Chief Complaint: OBESITY   History of Present Illness Carla Hanson is a 58 year old female with obesity and multiple myeloma who presents for obesity treatment assessment and progress evaluation.  She is adhering to a category two eating plan 85% of the time and engages in physical activity by walking three days a week for 20 to 25 minutes. She has lost four pounds in the last month. She uses hiking sticks for walking, having transitioned from a walker.  She is currently undergoing weekly chemotherapy for multiple myeloma, experiencing nausea and diarrhea, which affects her appetite. On some days, she is unable to eat and relies on sipping water and chicken broth. She maintains protein intake through protein shakes, which she can tolerate. Her appetite tends to return later in the day after treatment, and she experiences increased hunger the day before treatment. She describes the day after treatment as feeling like an 'induced hangover' with significant fatigue.  She manages her diet by incorporating antioxidant-rich foods such as leafy greens, berries, and fruit and kale smoothies. She sometimes consumes eggs mixed with grits to increase protein intake. She focuses on maintaining her protein intake and hydration during her  treatment.  She is considering stopping metformin  and plans to monitor her A1c levels. Her last A1c was 6.3, and she aims to reduce it to the fives, which she has achieved in the past. She recently consumed chocolate milk, which she occasionally uses as a coffee creamer substitute.      PHYSICAL EXAM:  Blood pressure 137/83, pulse 84, temperature 98.2 F (36.8 C), height 5' 1.5 (1.562 m), weight 259 lb (117.5 kg), SpO2 97%. Body mass index is 48.15 kg/m.  DIAGNOSTIC DATA REVIEWED:  BMET    Component Value Date/Time   NA 140 06/23/2024 1056   NA 145 (H) 02/24/2024 1217   K 3.1 (L) 06/23/2024 1056   CL 104 06/23/2024 1056   CO2 28 06/23/2024 1056   GLUCOSE 112 (H) 06/23/2024 1056   BUN 11 06/23/2024 1056   BUN 16 02/24/2024 1217   CREATININE 0.46 06/23/2024 1056   CREATININE 0.57 11/09/2014 1032   CALCIUM  9.8 06/23/2024 1056   GFRNONAA >60 06/23/2024 1056   GFRNONAA >89 12/29/2013 0942   GFRAA 127 12/08/2019 1210   GFRAA >89 12/29/2013 0942   Lab Results  Component Value Date   HGBA1C 6.3 (H) 01/29/2024   HGBA1C 6.0 12/31/2012   Lab Results  Component Value Date   INSULIN  35.5 (H) 01/29/2024   INSULIN  77.3 (H) 01/03/2021   Lab Results  Component Value Date   TSH 0.736 01/29/2024   CBC    Component Value Date/Time   WBC 10.7 (H) 06/23/2024 1056   WBC 9.0 02/05/2024 0501   RBC 4.73 06/23/2024 1056   HGB 12.7 06/23/2024 1056   HGB  11.3 02/10/2024 1201   HCT 39.4 06/23/2024 1056   HCT 34.7 02/10/2024 1201   PLT 238 06/23/2024 1056   PLT 342 02/10/2024 1201   MCV 83.3 06/23/2024 1056   MCV 84 02/10/2024 1201   MCH 26.8 06/23/2024 1056   MCHC 32.2 06/23/2024 1056   RDW 14.9 06/23/2024 1056   RDW 15.0 02/10/2024 1201   Iron Studies    Component Value Date/Time   IRON 27 02/10/2024 1201   TIBC 245 (L) 02/10/2024 1201   FERRITIN 384 (H) 02/10/2024 1201   IRONPCTSAT 11 (L) 02/10/2024 1201   Lipid Panel     Component Value Date/Time   CHOL 143  02/10/2024 1201   TRIG 113 02/10/2024 1201   HDL 45 02/10/2024 1201   CHOLHDL 3.2 02/10/2024 1201   CHOLHDL 6.3 (H) 01/08/2017 0947   VLDL 40 (H) 01/08/2017 0947   LDLCALC 77 02/10/2024 1201   Hepatic Function Panel     Component Value Date/Time   PROT 6.4 (L) 06/23/2024 1056   PROT 8.2 02/24/2024 1217   ALBUMIN 4.3 06/23/2024 1056   ALBUMIN 3.6 (L) 02/24/2024 1217   AST 16 06/23/2024 1056   ALT 22 06/23/2024 1056   ALKPHOS 44 06/23/2024 1056   BILITOT 0.5 06/23/2024 1056      Component Value Date/Time   TSH 0.736 01/29/2024 1553   Nutritional Lab Results  Component Value Date   VD25OH 66.9 01/29/2024   VD25OH 70.4 07/29/2023   VD25OH 63.8 03/25/2023     Assessment and Plan Assessment & Plan Obesity in the setting of active chemotherapy for multiple myeloma Obesity management is ongoing with a category two eating plan, adhered to 85% of the time, resulting in a four-pound weight loss over the last month. Physical activity includes walking three days a week for 20-25 minutes. Chemotherapy affects appetite, causing fluctuations in hunger due to nausea and fatigue. Emphasis on maintaining hydration and protein intake during chemotherapy, with encouragement to consume antioxidant-rich foods to aid in cellular repair. - Continue category two eating plan. - Encourage walking three days a week for 20-25 minutes. - Advise on maintaining hydration and protein intake, especially during chemotherapy. - Encourage consumption of antioxidant-rich foods, including deep red, purple, and orange fruits and vegetables, leafy greens, and berries.  Chemotherapy-induced nausea, diarrhea, and fatigue Nausea, diarrhea, and fatigue are common chemotherapy side effects, with nausea and fatigue most pronounced the day after treatment. Symptoms are managed by hydrating and consuming protein-rich foods and drinks. Appetite fluctuates, with increased hunger before and after chemotherapy sessions.  Strategies to manage symptoms include sipping cold protein drinks and consuming broth on days with severe nausea. - Advise sipping cold protein drinks slowly to manage nausea and maintain hydration. - Encourage consumption of broth on days with severe nausea and diarrhea - Monitor symptoms and adjust dietary intake as needed.  Pre-diabetes Pre-diabetes management includes monitoring A1c levels. Previous A1c was 6.3, with a goal to reduce it to the fives. Discussion about discontinuing metformin  with a plan to monitor A1c levels in three months. Current blood glucose level was 112 postprandial, consistent with recent food intake. Plan to assess A1c today to guide decision on metformin  discontinuation. - Order A1c test today. - Discontinue metformin  and monitor blood glucose levels. - Reassess A1c in three months.    She was informed of the importance of frequent follow up visits to maximize her success with intensive lifestyle modifications for her multiple health conditions.    Louann Penton, MD

## 2024-06-24 NOTE — Progress Notes (Signed)
 Complex Care Management Note Care Guide Note  06/24/2024 Name: Carla Hanson MRN: [id number] DOB: [birth year]   Complex Care Management Outreach Attempts: A second unsuccessful outreach was attempted today to offer the patient with information about available complex care management services.  Follow Up Plan:  Additional outreach attempts will be made to offer the patient complex care management information and services.   Encounter Outcome:  No Answer  Dreama [facility]  [facility], [facility] VBCI Assistant Direct Dial: [phone]  Fax: [phone]

## 2024-06-25 LAB — HEMOGLOBIN A1C
Est. average glucose Bld gHb Est-mCnc: 131 mg/dL
Hgb A1c MFr Bld: 6.2 % — ABNORMAL HIGH (ref 4.8–5.6)

## 2024-06-27 LAB — MULTIPLE MYELOMA PANEL, SERUM
Albumin SerPl Elph-Mcnc: 3.7 g/dL (ref 2.9–4.4)
Albumin/Glob SerPl: 1.5 (ref 0.7–1.7)
Alpha 1: 0.2 g/dL (ref 0.0–0.4)
Alpha2 Glob SerPl Elph-Mcnc: 0.9 g/dL (ref 0.4–1.0)
B-Globulin SerPl Elph-Mcnc: 0.9 g/dL (ref 0.7–1.3)
Gamma Glob SerPl Elph-Mcnc: 0.5 g/dL (ref 0.4–1.8)
Globulin, Total: 2.5 g/dL (ref 2.2–3.9)
IgA: 14 mg/dL — ABNORMAL LOW (ref 87–352)
IgG (Immunoglobin G), Serum: 606 mg/dL (ref 586–1602)
IgM (Immunoglobulin M), Srm: 11 mg/dL — ABNORMAL LOW (ref 26–217)
M Protein SerPl Elph-Mcnc: 0.2 g/dL — ABNORMAL HIGH
Total Protein ELP: 6.2 g/dL (ref 6.0–8.5)

## 2024-06-30 ENCOUNTER — Inpatient Hospital Stay

## 2024-06-30 ENCOUNTER — Encounter: Payer: Self-pay | Admitting: Hematology

## 2024-06-30 VITALS — BP 138/71 | HR 88 | Temp 98.4°F | Resp 18 | Ht 61.5 in | Wt 250.5 lb

## 2024-06-30 DIAGNOSIS — C9000 Multiple myeloma not having achieved remission: Secondary | ICD-10-CM

## 2024-06-30 DIAGNOSIS — Z5111 Encounter for antineoplastic chemotherapy: Secondary | ICD-10-CM | POA: Diagnosis not present

## 2024-06-30 LAB — CBC WITH DIFFERENTIAL (CANCER CENTER ONLY)
Abs Immature Granulocytes: 0.05 K/uL (ref 0.00–0.07)
Basophils Absolute: 0 K/uL (ref 0.0–0.1)
Basophils Relative: 0 %
Eosinophils Absolute: 0.1 K/uL (ref 0.0–0.5)
Eosinophils Relative: 1 %
HCT: 37.5 % (ref 36.0–46.0)
Hemoglobin: 12.3 g/dL (ref 12.0–15.0)
Immature Granulocytes: 0 %
Lymphocytes Relative: 16 %
Lymphs Abs: 1.9 K/uL (ref 0.7–4.0)
MCH: 27.2 pg (ref 26.0–34.0)
MCHC: 32.8 g/dL (ref 30.0–36.0)
MCV: 83 fL (ref 80.0–100.0)
Monocytes Absolute: 0.8 K/uL (ref 0.1–1.0)
Monocytes Relative: 6 %
Neutro Abs: 9.2 K/uL — ABNORMAL HIGH (ref 1.7–7.7)
Neutrophils Relative %: 77 %
Platelet Count: 241 K/uL (ref 150–400)
RBC: 4.52 MIL/uL (ref 3.87–5.11)
RDW: 15.2 % (ref 11.5–15.5)
WBC Count: 12.1 K/uL — ABNORMAL HIGH (ref 4.0–10.5)
nRBC: 0 % (ref 0.0–0.2)

## 2024-06-30 LAB — CMP (CANCER CENTER ONLY)
ALT: 23 U/L (ref 0–44)
AST: 16 U/L (ref 15–41)
Albumin: 4.1 g/dL (ref 3.5–5.0)
Alkaline Phosphatase: 42 U/L (ref 38–126)
Anion gap: 6 (ref 5–15)
BUN: 7 mg/dL (ref 6–20)
CO2: 30 mmol/L (ref 22–32)
Calcium: 9.5 mg/dL (ref 8.9–10.3)
Chloride: 106 mmol/L (ref 98–111)
Creatinine: 0.51 mg/dL (ref 0.44–1.00)
GFR, Estimated: >60 mL/min (ref >60)
Glucose, Bld: 105 mg/dL — ABNORMAL HIGH (ref 70–99)
Potassium: 3.3 mmol/L — ABNORMAL LOW (ref 3.5–5.1)
Sodium: 142 mmol/L (ref 135–145)
Total Bilirubin: 0.6 mg/dL (ref 0.0–1.2)
Total Protein: 6.4 g/dL — ABNORMAL LOW (ref 6.5–8.1)

## 2024-06-30 MED ORDER — FAMOTIDINE 20 MG PO TABS
20.0000 mg | ORAL_TABLET | Freq: Once | ORAL | Status: AC
  Administered 2024-06-30: 20 mg via ORAL
  Filled 2024-06-30: qty 1

## 2024-06-30 MED ORDER — ACETAMINOPHEN 325 MG PO TABS
650.0000 mg | ORAL_TABLET | Freq: Once | ORAL | Status: AC
  Administered 2024-06-30: 650 mg via ORAL
  Filled 2024-06-30: qty 2

## 2024-06-30 MED ORDER — DARATUMUMAB-HYALURONIDASE-FIHJ 1800-30000 MG-UT/15ML ~~LOC~~ SOLN
1800.0000 mg | Freq: Once | SUBCUTANEOUS | Status: AC
  Administered 2024-06-30: 1800 mg via SUBCUTANEOUS
  Filled 2024-06-30: qty 15

## 2024-06-30 MED ORDER — DIPHENHYDRAMINE HCL 25 MG PO CAPS
50.0000 mg | ORAL_CAPSULE | Freq: Once | ORAL | Status: AC
  Administered 2024-06-30: 50 mg via ORAL
  Filled 2024-06-30: qty 2

## 2024-06-30 MED ORDER — BORTEZOMIB CHEMO SQ INJECTION 3.5 MG (2.5MG/ML)
1.5000 mg/m2 | Freq: Once | INTRAMUSCULAR | Status: AC
  Administered 2024-06-30: 3.25 mg via SUBCUTANEOUS
  Filled 2024-06-30: qty 1.3

## 2024-06-30 MED ORDER — DEXAMETHASONE 4 MG PO TABS
20.0000 mg | ORAL_TABLET | Freq: Once | ORAL | Status: AC
  Administered 2024-06-30: 20 mg via ORAL
  Filled 2024-06-30: qty 5

## 2024-06-30 NOTE — Progress Notes (Signed)
 HEMATOLOGY/ONCOLOGY CLINIC NOTE  Date of Service: .06/23/2024  Patient Care Team: Anders Otto DASEN, MD as PCP - General (Family Medicine) Rosemarie [name] RAMAN, MD as Consulting Physician (Neurology) Wonda Cy BROCKS, RD as Dietitian (Family Medicine) Onesimo Emaline Brink, MD as Consulting Physician (Hematology)  CHIEF COMPLAINTS/PURPOSE OF CONSULTATION:  Follow-up for continued evaluation and management of multiple myeloma  HISTORY OF PRESENTING ILLNESS:  Carla Hanson is a wonderful 58 y.o. 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   Patient has a history of hypertension, dyslipidemia, COPD, history of PE/DVT, prediabetes, obesity who notes that she was working out in the gym and had significant back pain resulting in an x-ray that showed T6 compression fracture in January 2025.   She had a follow-up with healthy weight [facility] wellness clinic on 3/27 and reported symptoms of significant weakness dizziness lightheadedness and dehydration despite drinking a lot of water.  She also noted nausea but no emesis and constipation. Patient was seen in the emergency room and noted to have severe hypercalcemia with a calcium  level of 17.8 with an albumin of 3.8, total protein was elevated at 9.1 with an increased globulin gap, potassium was 2.7. 25-hydroxy vitamin D  levels were within normal limits at 66.9. TSH was normal at 0.736. PTH levels were appropriately suppressed at 6 which is suggestive of non hyperparathyroid hypercalcemia. CBC-on admission showed normal hemoglobin of 13.1 in the context of significant hemoconcentration.  This might be hiding her baseline anemia which might declare itself once the patient's volume status is replaced.  No other cytopenias. Patient notes no acute new focal bone pains.   Patient notes no fevers no chills no night sweats. No previous history of cancers. Her last mammogram was  on 02/07/2023 and was within normal limits.  She is previously noted to have a benign lipoma in the right breast which was resected. Patient has been ex-smoker and smoked half to 1 pack a day for 35 years and quit in December 2017.   New focal symptoms. Patient has received 1 dose of Zometa  and IV fluids and calcium  levels today are down to 12 with a corrected calcium  between 13 and 14. She is receiving IV fluids potassium replacement phosphorus  replacement and calcitonin today.   Normal labs have been ordered and patient is pending whole-body skeletal survey.  24-hour urine collection for myeloma has been sent out.   INTERVAL HISTORY:  Carla Hanson is a 58 y.o. female who is here for continued valuation and management of multiple myeloma She has completed 5 cycles of Dara Velcade  dexamethasone  and is here for follow-up prior to her 6 cycle of treatment. She notes that she has been doing well and has no acute new symptoms. No focal new bone pains. No new dental issues. No fevers no chills no night sweats no unexpected weight loss. No new tingling numbness in her hands or feet or other signs of neuropathy.. We had a detailed discussion and patient is very clear that she would not want to consider transplant evaluation at all.     MEDICAL HISTORY:  Past Medical History:  Diagnosis Date   Alcohol abuse    Anxiety    Back pain    Bipolar disorder (HCC)    Bronchitis    COPD exacerbation (HCC) 05/10/2016   Drug use    Edema 02/24/2024   History of blood clots    Hyperlipidemia  Hypertension    Influenza A 12/01/2018   Joint pain    Left leg DVT (HCC) 09/29/2013   Lipoma    Abdomen   LIPOMA 01/20/2008   Obesity    Occasional tremors 05/12/2019   Osteoarthritis of right knee    Other fatigue    Painful menstrual periods 01/05/2012   Pneumonia    Prediabetes    Seizure (HCC)    Shortness of breath on exertion    Stroke (HCC)    Tobacco abuse     SURGICAL  HISTORY: Past Surgical History:  Procedure Laterality Date   CESAREAN SECTION     CYSTECTOMY     LIPOMA EXCISION  03/2011   ORIF ANKLE FRACTURE  03/13/2012   Procedure: OPEN REDUCTION INTERNAL FIXATION (ORIF) ANKLE FRACTURE;  Surgeon: Lynwood FORBES Better, MD;  Location: [facility];  Service: Orthopedics;  Laterality: Right;   TUBAL LIGATION      SOCIAL HISTORY: Social History   Socioeconomic History   Marital status: Divorced    Spouse name: Not on file   Number of children: 4   Years of education: 10   Highest education level: Not on file  Occupational History   Occupation: stay at home  Tobacco Use   Smoking status: Former    Current packs/day: 0.00    Average packs/day: 0.5 packs/day for 35.0 years (17.5 ttl pk-yrs)    Types: Cigarettes    Start date: 11/04/1981    Quit date: 10/2016    Years since quitting: 7.7   Smokeless tobacco: Never  Vaping Use   Vaping status: Never Used  Substance and Sexual Activity   Alcohol use: No    Alcohol/week: 0.0 standard drinks of alcohol    Comment: Quit 10/2016   Drug use: Yes    Types: Marijuana   Sexual activity: Not Currently    Comment: tubal  Other Topics Concern   Not on file  Social History Narrative   Unemployed, 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  Considering nursing.    Lives at home alone   Right-handed   Caffeine: not much   Social Drivers of Corporate investment banker Strain: Low Risk  (05/14/2024)   Overall Financial Resource Strain (CARDIA)    Difficulty of Paying Living Expenses: Not hard at all  Food Insecurity: No Food Insecurity (05/14/2024)   Hunger Vital Sign    Worried About Running Out of Food in the Last Year: Never true    Ran Out of Food in the Last Year: Never true  Transportation Needs: No Transportation Needs (05/14/2024)   PRAPARE - Administrator, Civil Service (Medical): No    Lack of Transportation  (Non-Medical): No  Physical Activity: Insufficiently Active (05/14/2024)   Exercise Vital Sign    Days of Exercise per Week: 3 days    Minutes of Exercise per Session: 20 min  Stress: No Stress Concern Present (05/14/2024)   [employer] of Occupational Health - Occupational Stress Questionnaire    Feeling of Stress: Not at all  Social Connections: Moderately Integrated (05/14/2024)   Social Connection and Isolation Panel    Frequency of Communication with Friends and Family: More than three times a week    Frequency of Social Gatherings with Friends and Family: More than three times a week    Attends Religious Services: More than 4 times per year    Active Member of [employer]  or Organizations: Yes    Attends Banker Meetings: 1 to 4 times per year    Marital Status: Divorced  Intimate Partner Violence: Not At Risk (02/06/2024)   Humiliation, Afraid, Rape, and Kick questionnaire    Fear of Current or Ex-Partner: No    Emotionally Abused: No    Physically Abused: No    Sexually Abused: No    FAMILY HISTORY: Family History  Problem Relation Age of Onset   Other Mother        High blood pressure runs in the family   Obesity Mother    Asthma Father    Heart attack Father    Obesity Father    Diabetes Maternal Aunt    Breast cancer Neg Hx     ALLERGIES:  is allergic to latuda  [lurasidone ].  MEDICATIONS:  Current Outpatient Medications  Medication Sig Dispense Refill   acetaminophen  (TYLENOL ) 325 MG tablet Take 650 mg by mouth every 6 (six) hours as needed for mild pain (pain score 1-3).     acyclovir  (ZOVIRAX ) 400 MG tablet Take 1 tablet (400 mg total) by mouth 2 (two) times daily. 60 tablet 5   albuterol  (PROVENTIL ) (2.5 MG/3ML) 0.083% nebulizer solution Take 3 mLs (2.5 mg total) by nebulization every 6 (six) hours as needed for wheezing or shortness of breath. 75 mL 2   albuterol  (VENTOLIN  HFA) 108 (90 Base) MCG/ACT inhaler Inhale 2 puffs into the lungs every 6  (six) hours as needed for wheezing or shortness of breath. 18 g 2   aspirin  EC 81 MG EC tablet Take 1 tablet (81 mg total) by mouth daily. 30 tablet 12   cetirizine  (ZYRTEC ) 10 MG tablet Take 1 tablet (10 mg total) by mouth daily. 90 tablet 1   ferrous sulfate  325 (65 FE) MG tablet Take 325 mg by mouth daily with breakfast.     furosemide  (LASIX ) 40 MG tablet Take 1 tablet (40 mg total) by mouth daily. 90 tablet 1   losartan  (COZAAR ) 50 MG tablet Take 1 tablet (50 mg total) by mouth daily. 90 tablet 0   metFORMIN  (GLUCOPHAGE ) 500 MG tablet Take 1 tablet (500 mg total) by mouth daily with breakfast. 90 tablet 0   ondansetron  (ZOFRAN ) 8 MG tablet Take 1 tablet (8 mg total) by mouth every 8 (eight) hours as needed for nausea or vomiting. 30 tablet 1   potassium chloride  SA (KLOR-CON  M) 20 MEQ tablet Take 1 tablet (20 mEq total) by mouth daily. 30 tablet 2   prochlorperazine  (COMPAZINE ) 10 MG tablet Take 1 tablet (10 mg total) by mouth every 6 (six) hours as needed for nausea or vomiting 30 tablet 1   rosuvastatin  (CRESTOR ) 20 MG tablet Take 1 tablet (20 mg total) by mouth daily. 90 tablet 1   tiZANidine  (ZANAFLEX ) 4 MG tablet Take 1 tablet (4 mg total) by mouth 2 (two) times daily as needed for muscle spasms. 60 tablet 1   No current facility-administered medications for this visit.    REVIEW OF SYSTEMS:   .10 Point review of Systems was done is negative except as noted above.   PHYSICAL EXAMINATION: ECOG PERFORMANCE STATUS: 2 .BP (!) 165/80   Pulse 91   Temp 98.1 F (36.7 C)   Resp 20   Wt 263 lb 8 oz (119.5 kg)   LMP  (LMP Unknown)   SpO2 97%   BMI 49.79 kg/m   . Wt Readings from Last 3 Encounters:  06/24/24 259 lb (117.5 kg)  06/23/24 263 lb 8 oz (119.5 kg)  06/17/24 265 lb (120.2 kg)  . GENERAL:alert, in no acute distress and comfortable SKIN: no acute rashes, no significant lesions EYES: conjunctiva are pink and non-injected, sclera anicteric OROPHARYNX: MMM, no exudates,  no oropharyngeal erythema or ulceration NECK: supple, no JVD LYMPH:  no palpable lymphadenopathy in the cervical, axillary or inguinal regions LUNGS: clear to auscultation b/l with normal respiratory effort HEART: regular rate & rhythm ABDOMEN:  normoactive bowel sounds , non tender, not distended. Extremity: no pedal edema PSYCH: alert & oriented x 3 with fluent speech NEURO: no focal motor/sensory deficits   LABORATORY DATA:  I have reviewed the data as listed  .    Latest Ref Rng & Units 06/23/2024   10:56 AM 06/17/2024   10:32 AM 06/09/2024    2:05 PM  CBC  WBC 4.0 - 10.5 K/uL 10.7  9.1  8.8   Hemoglobin 12.0 - 15.0 g/dL 87.2  87.0  88.2   Hematocrit 36.0 - 46.0 % 39.4  39.6  35.8   Platelets 150 - 400 K/uL 238  283  253     .    Latest Ref Rng & Units 06/23/2024   10:56 AM 06/09/2024    2:05 PM 06/02/2024    2:13 PM  CMP  Glucose 70 - 99 mg/dL 887  893  98   BUN 6 - 20 mg/dL 11  9  12    Creatinine 0.44 - 1.00 mg/dL 9.53  9.48  9.44   Sodium 135 - 145 mmol/L 140  141  141   Potassium 3.5 - 5.1 mmol/L 3.1  3.7  3.5   Chloride 98 - 111 mmol/L 104  106  105   CO2 22 - 32 mmol/L 28  28  28    Calcium  8.9 - 10.3 mg/dL 9.8  9.1  9.4   Total Protein 6.5 - 8.1 g/dL 6.4  6.4  6.7   Total Bilirubin 0.0 - 1.2 mg/dL 0.5  0.4  0.5   Alkaline Phos 38 - 126 U/L 44  45  44   AST 15 - 41 U/L 16  16  17    ALT 0 - 44 U/L 22  20  19     02/05/2024 Bone Marrow Biopsy:       RADIOGRAPHIC STUDIES: I have personally reviewed the radiological images as listed and agreed with the findings in the report. No results found.   ASSESSMENT & PLAN:   58 year old female with severe non-PTH hypercalcemia with an admission calcium  level of 17.8    1) IgG lambda multiple myeloma Myeloma FISH panel with no notable mutations Status post severely elevated calcium  level 17.8 on admission now resolved  #2 dehydration and volume contraction due to hypercalcemia.   #3  Hypertension #4  dyslipidemia #5 COPD #6 prediabetes #7 multiple electrolyte abnormalities including hypokalemia, hypophosphatemia and severe hypercalcemia. #8 multiple osseous lesions concerning for multiple myeloma.  Also noted to have left femoral intertrochanteric lesion. #9 history of T6 compression fracture was likely related to multiple myeloma.  Mild pain at this time. #10 h/o VTE in the past.  PLAN: - Discussed patient's labs from today 06/23/2024 CBC shows stable problems with a hemoglobin of 12.7 normal WBC count of platelets CMP showed some hypokalemia with potassium of 3.1 normal creatinine and calcium  levels Patient was recommended to increase her p.o. potassium intake at home and eat potassium rich foods. Will continue to monitor potassium on subsequent labs. Her myeloma panel shows her  M spike is down to 0.2 which is suggestive of very good partial response at this time. Serum kappa lambda free light chain ratio is normal. No notable toxicities from her current treatment. She is appropriate to proceed with her cycle 6 of Dara Faspro/Velcade /dexamethasone  with a plan to complete a few more cycles to reach maximum response followed by maintenance therapy. Still declines any consideration of autologous bone marrow transplantation. Will plan to transition to maintenance therapy after a few more cycles of consolidative therapy. Continue monthly Zometa  at this time.  No new dental issues. FOLLOW-UP: Per integrative scheduling Next MD visit in 3-4 weeks   The total time spent in the appointment was 30 minutes*.  All of the patient's questions were answered with apparent satisfaction. The patient knows to call the clinic with any problems, questions or concerns.   Emaline Saran MD MS AAHIVMS [facility] Hematology/Oncology Physician [facility]  .*Total Encounter Time as defined by the Centers for Medicare and Medicaid Services includes, in addition to the face-to-face time of a  patient visit (documented in the note above) non-face-to-face time: obtaining and reviewing outside history, ordering and reviewing medications, tests or procedures, care coordination (communications with other health care professionals or caregivers) and documentation in the medical record.

## 2024-06-30 NOTE — Patient Instructions (Signed)
 [facility] - A DEPT OF [facility]  Discharge Instructions: Thank you for choosing [facility] to provide your oncology and hematology care.   If you have a lab appointment with the Cancer Center, please go directly to the Cancer Center and check in at the registration area.   Wear comfortable clothing and clothing appropriate for easy access to any Portacath or PICC line.   We strive to give you quality time with your provider. You may need to reschedule your appointment if you arrive late (15 or more minutes).  Arriving late affects you and other patients whose appointments are after yours.  Also, if you miss three or more appointments without notifying the office, you may be dismissed from the clinic at the provider's discretion.      For prescription refill requests, have your pharmacy contact our office and allow 72 hours for refills to be completed.    Today you received the following chemotherapy and/or immunotherapy agents :  Bortezomib  & Daratumumab       To help prevent nausea and vomiting after your treatment, we encourage you to take your nausea medication as directed.  BELOW ARE SYMPTOMS THAT SHOULD BE REPORTED IMMEDIATELY: *FEVER GREATER THAN 100.4 F (38 C) OR HIGHER *CHILLS OR SWEATING *NAUSEA AND VOMITING THAT IS NOT CONTROLLED WITH YOUR NAUSEA MEDICATION *UNUSUAL SHORTNESS OF BREATH *UNUSUAL BRUISING OR BLEEDING *URINARY PROBLEMS (pain or burning when urinating, or frequent urination) *BOWEL PROBLEMS (unusual diarrhea, constipation, pain near the anus) TENDERNESS IN MOUTH AND THROAT WITH OR WITHOUT PRESENCE OF ULCERS (sore throat, sores in mouth, or a toothache) UNUSUAL RASH, SWELLING OR PAIN  UNUSUAL VAGINAL DISCHARGE OR ITCHING   Items with * indicate a potential emergency and should be followed up as soon as possible or go to the Emergency Department if any problems should occur.  Please show the CHEMOTHERAPY ALERT CARD  or IMMUNOTHERAPY ALERT CARD at check-in to the Emergency Department and triage nurse.  Should you have questions after your visit or need to cancel or reschedule your appointment, please contact [facility]  Dept: [phone]  and follow the prompts.  Office hours are 8:00 a.m. to 4:30 p.m. Monday - Friday. Please note that voicemails left after 4:00 p.m. may not be returned until the following business day.  We are closed weekends and major holidays. You have access to a nurse at all times for urgent questions. Please call the main number to the clinic Dept: [phone] and follow the prompts.   For any non-urgent questions, you may also contact your provider using MyChart. We now offer e-Visits for anyone 71 and older to request care online for non-urgent symptoms. For details visit [email or web address].   Also download the MyChart app! Go to the app store, search MyChart, open the app, select , and log in with your MyChart username and password.

## 2024-06-30 NOTE — Progress Notes (Signed)
 Complex Care Management Note Care Guide Note  06/30/2024 Name: Carla Hanson MRN: [id number] DOB: 1966/07/09   Complex Care Management Outreach Attempts: A third unsuccessful outreach was attempted today to offer the patient with information about available complex care management services.  Follow Up Plan:  No further outreach attempts will be made at this time. We have been unable to contact the patient to offer or enroll patient in complex care management services.  Encounter Outcome:  No Answer  Dreama [facility]  [facility] VBCI Assistant Direct Dial: [phone]  Fax: [phone]

## 2024-06-30 NOTE — Progress Notes (Signed)
 Per Dr Onesimo, ok to adjust Velcade  dose down for lower weight..she is actively working on losing weight.  BSA used = 2.22 m2  Wilma Dollar, Pharm.D., CPP 06/30/2024@3 :54 PM

## 2024-07-05 ENCOUNTER — Other Ambulatory Visit: Payer: Self-pay | Admitting: Hematology

## 2024-07-06 ENCOUNTER — Other Ambulatory Visit (HOSPITAL_COMMUNITY): Payer: Self-pay

## 2024-07-06 ENCOUNTER — Other Ambulatory Visit: Payer: Self-pay

## 2024-07-06 MED ORDER — PROCHLORPERAZINE MALEATE 10 MG PO TABS
10.0000 mg | ORAL_TABLET | Freq: Four times a day (QID) | ORAL | 1 refills | Status: DC | PRN
  Filled 2024-07-06: qty 30, 8d supply, fill #0
  Filled 2024-07-21: qty 30, 8d supply, fill #1

## 2024-07-07 ENCOUNTER — Ambulatory Visit: Admitting: Hematology

## 2024-07-07 ENCOUNTER — Inpatient Hospital Stay

## 2024-07-07 ENCOUNTER — Inpatient Hospital Stay: Attending: Hematology

## 2024-07-07 VITALS — BP 170/77 | HR 95 | Temp 97.9°F | Resp 16

## 2024-07-07 DIAGNOSIS — Z5111 Encounter for antineoplastic chemotherapy: Secondary | ICD-10-CM | POA: Insufficient documentation

## 2024-07-07 DIAGNOSIS — C9000 Multiple myeloma not having achieved remission: Secondary | ICD-10-CM | POA: Insufficient documentation

## 2024-07-07 DIAGNOSIS — Z5112 Encounter for antineoplastic immunotherapy: Secondary | ICD-10-CM | POA: Insufficient documentation

## 2024-07-07 LAB — CBC WITH DIFFERENTIAL (CANCER CENTER ONLY)
Abs Immature Granulocytes: 0.02 K/uL (ref 0.00–0.07)
Basophils Absolute: 0 K/uL (ref 0.0–0.1)
Basophils Relative: 0 %
Eosinophils Absolute: 0.1 K/uL (ref 0.0–0.5)
Eosinophils Relative: 1 %
HCT: 35.7 % — ABNORMAL LOW (ref 36.0–46.0)
Hemoglobin: 11.8 g/dL — ABNORMAL LOW (ref 12.0–15.0)
Immature Granulocytes: 0 %
Lymphocytes Relative: 20 %
Lymphs Abs: 2 K/uL (ref 0.7–4.0)
MCH: 27.1 pg (ref 26.0–34.0)
MCHC: 33.1 g/dL (ref 30.0–36.0)
MCV: 82.1 fL (ref 80.0–100.0)
Monocytes Absolute: 0.7 K/uL (ref 0.1–1.0)
Monocytes Relative: 7 %
Neutro Abs: 7 K/uL (ref 1.7–7.7)
Neutrophils Relative %: 72 %
Platelet Count: 267 K/uL (ref 150–400)
RBC: 4.35 MIL/uL (ref 3.87–5.11)
RDW: 15.2 % (ref 11.5–15.5)
WBC Count: 9.9 K/uL (ref 4.0–10.5)
nRBC: 0 % (ref 0.0–0.2)

## 2024-07-07 LAB — CMP (CANCER CENTER ONLY)
ALT: 17 U/L (ref 0–44)
AST: 15 U/L (ref 15–41)
Albumin: 4.1 g/dL (ref 3.5–5.0)
Alkaline Phosphatase: 43 U/L (ref 38–126)
Anion gap: 6 (ref 5–15)
BUN: 13 mg/dL (ref 6–20)
CO2: 28 mmol/L (ref 22–32)
Calcium: 10 mg/dL (ref 8.9–10.3)
Chloride: 107 mmol/L (ref 98–111)
Creatinine: 0.42 mg/dL — ABNORMAL LOW (ref 0.44–1.00)
GFR, Estimated: >60 mL/min (ref >60)
Glucose, Bld: 100 mg/dL — ABNORMAL HIGH (ref 70–99)
Potassium: 3.3 mmol/L — ABNORMAL LOW (ref 3.5–5.1)
Sodium: 141 mmol/L (ref 135–145)
Total Bilirubin: 0.4 mg/dL (ref 0.0–1.2)
Total Protein: 6.8 g/dL (ref 6.5–8.1)

## 2024-07-07 MED ORDER — DARATUMUMAB-HYALURONIDASE-FIHJ 1800-30000 MG-UT/15ML ~~LOC~~ SOLN
1800.0000 mg | Freq: Once | SUBCUTANEOUS | Status: AC
  Administered 2024-07-07: 1800 mg via SUBCUTANEOUS
  Filled 2024-07-07: qty 15

## 2024-07-07 MED ORDER — DEXAMETHASONE 4 MG PO TABS
20.0000 mg | ORAL_TABLET | Freq: Once | ORAL | Status: AC
  Administered 2024-07-07: 20 mg via ORAL
  Filled 2024-07-07: qty 5

## 2024-07-07 MED ORDER — BORTEZOMIB CHEMO SQ INJECTION 3.5 MG (2.5MG/ML)
1.5000 mg/m2 | Freq: Once | INTRAMUSCULAR | Status: AC
  Administered 2024-07-07: 3.25 mg via SUBCUTANEOUS
  Filled 2024-07-07: qty 1.3

## 2024-07-07 MED ORDER — FAMOTIDINE 20 MG PO TABS
20.0000 mg | ORAL_TABLET | Freq: Once | ORAL | Status: AC
  Administered 2024-07-07: 20 mg via ORAL
  Filled 2024-07-07: qty 1

## 2024-07-07 MED ORDER — ACETAMINOPHEN 325 MG PO TABS
650.0000 mg | ORAL_TABLET | Freq: Once | ORAL | Status: AC
  Administered 2024-07-07: 650 mg via ORAL
  Filled 2024-07-07: qty 2

## 2024-07-07 MED ORDER — DIPHENHYDRAMINE HCL 25 MG PO CAPS
50.0000 mg | ORAL_CAPSULE | Freq: Once | ORAL | Status: AC
  Administered 2024-07-07: 50 mg via ORAL
  Filled 2024-07-07: qty 2

## 2024-07-07 NOTE — Patient Instructions (Signed)
 [facility] - A DEPT OF [place]  Discharge Instructions: Thank you for choosing [facility] to provide your oncology and hematology care.   If you have a lab appointment with the Cancer Center, please go directly to the Cancer Center and check in at the registration area.   Wear comfortable clothing and clothing appropriate for easy access to any Portacath or PICC line.   We strive to give you quality time with your provider. You may need to reschedule your appointment if you arrive late (15 or more minutes).  Arriving late affects you and other patients whose appointments are after yours.  Also, if you miss three or more appointments without notifying the office, you may be dismissed from the clinic at the provider's discretion.      For prescription refill requests, have your pharmacy contact our office and allow 72 hours for refills to be completed.    Today you received the following chemotherapy and/or immunotherapy agents: bortezomib  SQ (VELCADE ), daratumumab -hyaluronidase -fihj (DARZALEX  FASPRO)    To help prevent nausea and vomiting after your treatment, we encourage you to take your nausea medication as directed.  BELOW ARE SYMPTOMS THAT SHOULD BE REPORTED IMMEDIATELY: *FEVER GREATER THAN 100.4 F (38 C) OR HIGHER *CHILLS OR SWEATING *NAUSEA AND VOMITING THAT IS NOT CONTROLLED WITH YOUR NAUSEA MEDICATION *UNUSUAL SHORTNESS OF BREATH *UNUSUAL BRUISING OR BLEEDING *URINARY PROBLEMS (pain or burning when urinating, or frequent urination) *BOWEL PROBLEMS (unusual diarrhea, constipation, pain near the anus) TENDERNESS IN MOUTH AND THROAT WITH OR WITHOUT PRESENCE OF ULCERS (sore throat, sores in mouth, or a toothache) UNUSUAL RASH, SWELLING OR PAIN  UNUSUAL VAGINAL DISCHARGE OR ITCHING   Items with * indicate a potential emergency and should be followed up as soon as possible or go to the Emergency Department if any problems should  occur.  Please show the CHEMOTHERAPY ALERT CARD or IMMUNOTHERAPY ALERT CARD at check-in to the Emergency Department and triage nurse.  Should you have questions after your visit or need to cancel or reschedule your appointment, please contact [facility] DE[facility]  Dept: [phone]  and follow the prompts.  Office hours are 8:00 a.m. to 4:30 p.m. Monday - Friday. Please note that voicemails left after 4:00 p.m. may not be returned until the following business day.  We are closed weekends and major holidays. You have access to a nurse at all times for urgent questions. Please call the main number to the clinic Dept: [phone] and follow the prompts.   For any non-urgent questions, you may also contact your provider using MyChart. We now offer e-Visits for anyone 25 and older to request care online for non-urgent symptoms. For details visit [email or web address].   Also download the MyChart app! Go to the app store, search MyChart, open the app, select [place], and log in with your MyChart username and password.

## 2024-07-12 ENCOUNTER — Other Ambulatory Visit: Payer: Self-pay | Admitting: Hematology

## 2024-07-12 DIAGNOSIS — C9000 Multiple myeloma not having achieved remission: Secondary | ICD-10-CM

## 2024-07-13 ENCOUNTER — Inpatient Hospital Stay

## 2024-07-13 ENCOUNTER — Encounter: Payer: Self-pay | Admitting: Hematology

## 2024-07-13 VITALS — BP 139/74 | HR 84 | Temp 98.2°F | Resp 18 | Wt 262.0 lb

## 2024-07-13 DIAGNOSIS — C9000 Multiple myeloma not having achieved remission: Secondary | ICD-10-CM

## 2024-07-13 DIAGNOSIS — Z5111 Encounter for antineoplastic chemotherapy: Secondary | ICD-10-CM | POA: Diagnosis not present

## 2024-07-13 LAB — CMP (CANCER CENTER ONLY)
ALT: 15 U/L (ref 0–44)
AST: 16 U/L (ref 15–41)
Albumin: 4.1 g/dL (ref 3.5–5.0)
Alkaline Phosphatase: 41 U/L (ref 38–126)
Anion gap: 6 (ref 5–15)
BUN: 11 mg/dL (ref 6–20)
CO2: 28 mmol/L (ref 22–32)
Calcium: 9.5 mg/dL (ref 8.9–10.3)
Chloride: 105 mmol/L (ref 98–111)
Creatinine: 0.52 mg/dL (ref 0.44–1.00)
GFR, Estimated: >60 mL/min (ref >60)
Glucose, Bld: 92 mg/dL (ref 70–99)
Potassium: 3.6 mmol/L (ref 3.5–5.1)
Sodium: 139 mmol/L (ref 135–145)
Total Bilirubin: 0.5 mg/dL (ref 0.0–1.2)
Total Protein: 6.6 g/dL (ref 6.5–8.1)

## 2024-07-13 LAB — CBC WITH DIFFERENTIAL (CANCER CENTER ONLY)
Abs Immature Granulocytes: 0.04 K/uL (ref 0.00–0.07)
Basophils Absolute: 0 K/uL (ref 0.0–0.1)
Basophils Relative: 0 %
Eosinophils Absolute: 0.1 K/uL (ref 0.0–0.5)
Eosinophils Relative: 1 %
HCT: 35.9 % — ABNORMAL LOW (ref 36.0–46.0)
Hemoglobin: 11.6 g/dL — ABNORMAL LOW (ref 12.0–15.0)
Immature Granulocytes: 0 %
Lymphocytes Relative: 21 %
Lymphs Abs: 2.3 K/uL (ref 0.7–4.0)
MCH: 26.8 pg (ref 26.0–34.0)
MCHC: 32.3 g/dL (ref 30.0–36.0)
MCV: 82.9 fL (ref 80.0–100.0)
Monocytes Absolute: 0.7 K/uL (ref 0.1–1.0)
Monocytes Relative: 7 %
Neutro Abs: 7.7 K/uL (ref 1.7–7.7)
Neutrophils Relative %: 71 %
Platelet Count: 266 K/uL (ref 150–400)
RBC: 4.33 MIL/uL (ref 3.87–5.11)
RDW: 15.3 % (ref 11.5–15.5)
WBC Count: 11 K/uL — ABNORMAL HIGH (ref 4.0–10.5)
nRBC: 0 % (ref 0.0–0.2)

## 2024-07-13 MED ORDER — ZOLEDRONIC ACID 4 MG/100ML IV SOLN
4.0000 mg | Freq: Once | INTRAVENOUS | Status: AC
  Administered 2024-07-13: 4 mg via INTRAVENOUS
  Filled 2024-07-13: qty 100

## 2024-07-13 MED ORDER — DEXAMETHASONE 4 MG PO TABS
20.0000 mg | ORAL_TABLET | Freq: Once | ORAL | Status: AC
  Administered 2024-07-13: 20 mg via ORAL
  Filled 2024-07-13: qty 5

## 2024-07-13 MED ORDER — ACETAMINOPHEN 325 MG PO TABS
650.0000 mg | ORAL_TABLET | Freq: Once | ORAL | Status: AC
  Administered 2024-07-13: 650 mg via ORAL
  Filled 2024-07-13: qty 2

## 2024-07-13 MED ORDER — FAMOTIDINE 20 MG PO TABS
20.0000 mg | ORAL_TABLET | Freq: Once | ORAL | Status: AC
  Administered 2024-07-13: 20 mg via ORAL
  Filled 2024-07-13: qty 1

## 2024-07-13 MED ORDER — DIPHENHYDRAMINE HCL 25 MG PO CAPS
50.0000 mg | ORAL_CAPSULE | Freq: Once | ORAL | Status: AC
  Administered 2024-07-13: 50 mg via ORAL
  Filled 2024-07-13: qty 2

## 2024-07-13 MED ORDER — DARATUMUMAB-HYALURONIDASE-FIHJ 1800-30000 MG-UT/15ML ~~LOC~~ SOLN
1800.0000 mg | Freq: Once | SUBCUTANEOUS | Status: AC
  Administered 2024-07-13: 1800 mg via SUBCUTANEOUS
  Filled 2024-07-13: qty 15

## 2024-07-13 MED ORDER — BORTEZOMIB CHEMO SQ INJECTION 3.5 MG (2.5MG/ML)
1.5000 mg/m2 | Freq: Once | INTRAMUSCULAR | Status: AC
  Administered 2024-07-13: 3.25 mg via SUBCUTANEOUS
  Filled 2024-07-13: qty 1.3

## 2024-07-13 NOTE — Patient Instructions (Signed)
 [facility] - A DEPT OF [place]  Discharge Instructions: Thank you for choosing [facility] to provide your oncology and hematology care.   If you have a lab appointment with the Cancer Center, please go directly to the Cancer Center and check in at the registration area.   Wear comfortable clothing and clothing appropriate for easy access to any Portacath or PICC line.   We strive to give you quality time with your provider. You may need to reschedule your appointment if you arrive late (15 or more minutes).  Arriving late affects you and other patients whose appointments are after yours.  Also, if you miss three or more appointments without notifying the office, you may be dismissed from the clinic at the provider's discretion.      For prescription refill requests, have your pharmacy contact our office and allow 72 hours for refills to be completed.    Today you received the following chemotherapy and/or immunotherapy agents darzalex  faspro and velcade       To help prevent nausea and vomiting after your treatment, we encourage you to take your nausea medication as directed.  BELOW ARE SYMPTOMS THAT SHOULD BE REPORTED IMMEDIATELY: *FEVER GREATER THAN 100.4 F (38 C) OR HIGHER *CHILLS OR SWEATING *NAUSEA AND VOMITING THAT IS NOT CONTROLLED WITH YOUR NAUSEA MEDICATION *UNUSUAL SHORTNESS OF BREATH *UNUSUAL BRUISING OR BLEEDING *URINARY PROBLEMS (pain or burning when urinating, or frequent urination) *BOWEL PROBLEMS (unusual diarrhea, constipation, pain near the anus) TENDERNESS IN MOUTH AND THROAT WITH OR WITHOUT PRESENCE OF ULCERS (sore throat, sores in mouth, or a toothache) UNUSUAL RASH, SWELLING OR PAIN  UNUSUAL VAGINAL DISCHARGE OR ITCHING   Items with * indicate a potential emergency and should be followed up as soon as possible or go to the Emergency Department if any problems should occur.  Please show the CHEMOTHERAPY ALERT CARD  or IMMUNOTHERAPY ALERT CARD at check-in to the Emergency Department and triage nurse.  Should you have questions after your visit or need to cancel or reschedule your appointment, please contact [facility] DE[facility]  Dept: [phone]  and follow the prompts.  Office hours are 8:00 a.m. to 4:30 p.m. Monday - Friday. Please note that voicemails left after 4:00 p.m. may not be returned until the following business day.  We are closed weekends and major holidays. You have access to a nurse at all times for urgent questions. Please call the main number to the clinic Dept: [phone] and follow the prompts.   For any non-urgent questions, you may also contact your provider using MyChart. We now offer e-Visits for anyone 43 and older to request care online for non-urgent symptoms. For details visit [email or web address].   Also download the MyChart app! Go to the app store, search MyChart, open the app, select [facility], and log in with your MyChart username and password.

## 2024-07-14 LAB — KAPPA/LAMBDA LIGHT CHAINS
Kappa free light chain: 3.7 mg/L (ref 3.3–19.4)
Kappa, lambda light chain ratio: 1.68 — ABNORMAL HIGH (ref 0.26–1.65)
Lambda free light chains: 2.2 mg/L — ABNORMAL LOW (ref 5.7–26.3)

## 2024-07-16 LAB — MULTIPLE MYELOMA PANEL, SERUM
Albumin SerPl Elph-Mcnc: 3.4 g/dL (ref 2.9–4.4)
Albumin/Glob SerPl: 1.3 (ref 0.7–1.7)
Alpha 1: 0.2 g/dL (ref 0.0–0.4)
Alpha2 Glob SerPl Elph-Mcnc: 0.9 g/dL (ref 0.4–1.0)
B-Globulin SerPl Elph-Mcnc: 0.9 g/dL (ref 0.7–1.3)
Gamma Glob SerPl Elph-Mcnc: 0.7 g/dL (ref 0.4–1.8)
Globulin, Total: 2.7 g/dL (ref 2.2–3.9)
IgA: 10 mg/dL — ABNORMAL LOW (ref 87–352)
IgG (Immunoglobin G), Serum: 842 mg/dL (ref 586–1602)
IgM (Immunoglobulin M), Srm: 7 mg/dL — ABNORMAL LOW (ref 26–217)
M Protein SerPl Elph-Mcnc: 0.5 g/dL — ABNORMAL HIGH
Total Protein ELP: 6.1 g/dL (ref 6.0–8.5)

## 2024-07-19 ENCOUNTER — Ambulatory Visit (INDEPENDENT_AMBULATORY_CARE_PROVIDER_SITE_OTHER): Admitting: Family Medicine

## 2024-07-19 ENCOUNTER — Encounter (INDEPENDENT_AMBULATORY_CARE_PROVIDER_SITE_OTHER): Payer: Self-pay | Admitting: Family Medicine

## 2024-07-19 ENCOUNTER — Other Ambulatory Visit (HOSPITAL_COMMUNITY): Payer: Self-pay

## 2024-07-19 VITALS — BP 136/81 | HR 80 | Temp 98.0°F | Ht 61.5 in | Wt 253.0 lb

## 2024-07-19 DIAGNOSIS — I10 Essential (primary) hypertension: Secondary | ICD-10-CM | POA: Diagnosis not present

## 2024-07-19 DIAGNOSIS — Z6841 Body Mass Index (BMI) 40.0 and over, adult: Secondary | ICD-10-CM

## 2024-07-19 DIAGNOSIS — E66813 Obesity, class 3: Secondary | ICD-10-CM

## 2024-07-19 MED ORDER — LOSARTAN POTASSIUM 50 MG PO TABS
50.0000 mg | ORAL_TABLET | Freq: Every day | ORAL | 0 refills | Status: DC
  Filled 2024-07-19 – 2024-08-25 (×4): qty 90, 90d supply, fill #0

## 2024-07-19 NOTE — Progress Notes (Signed)
 Office: [phone]  /  Fax: [phone]  WEIGHT SUMMARY AND BIOMETRICS  Anthropometric Measurements Height: 5' 1.5 (1.562 m) Weight: 253 lb (114.8 kg) BMI (Calculated): 47.04 Weight at Last Visit: 259 lb Weight Lost Since Last Visit: 6 lb Weight Gained Since Last Visit: 0 Starting Weight: 324 lb Total Weight Loss (lbs): 71 lb (32.2 kg) Peak Weight: 324 lb   Body Composition  Body Fat %: 57.2 % Fat Mass (lbs): 144.8 lbs Muscle Mass (lbs): 102.8 lbs Visceral Fat Rating : 20   Other Clinical Data Fasting: no Labs: no Today's Visit #: 60 Starting Date: 01/03/21    Chief Complaint: OBESITY   History of Present Illness Carla Hanson is a 58 year old female with obesity and hypertension who presents for obesity treatment and progress assessment.  She adheres to a category two eating plan approximately ninety percent of the time, focusing on meeting her fruits and vegetable intake goals. She makes efforts to hydrate adequately and avoid skipping meals. However, she struggles to consume all her protein, particularly in the morning, and sometimes resorts to a protein shake when she cannot tolerate solid food. She experiences nausea if she goes too long without eating and has lost six pounds since last month.  She engages in physical activity by walking for exercise for twenty-five minutes three days per week. She prefers outdoor walking in the early morning to avoid pollen and enjoys the fresh air.  She gets seven to nine hours of sleep per night.  Her blood pressure was initially elevated at 140/85 mmHg but improved to 136/81 mmHg upon repeat measurement. She is currently taking losartan  50 mg daily for hypertension and requests a refill.  She reports having good and bad days, with some days marked by fatigue requiring rest. Overall, she feels she is doing okay.      PHYSICAL EXAM:  Blood pressure 136/81, pulse 80, temperature 98 F (36.7 C), height  5' 1.5 (1.562 m), weight 253 lb (114.8 kg), SpO2 97%. Body mass index is 47.03 kg/m.  DIAGNOSTIC DATA REVIEWED:  BMET    Component Value Date/Time   NA 139 07/13/2024 1248   NA 145 (H) 02/24/2024 1217   K 3.6 07/13/2024 1248   CL 105 07/13/2024 1248   CO2 28 07/13/2024 1248   GLUCOSE 92 07/13/2024 1248   BUN 11 07/13/2024 1248   BUN 16 02/24/2024 1217   CREATININE 0.52 07/13/2024 1248   CREATININE 0.57 11/09/2014 1032   CALCIUM  9.5 07/13/2024 1248   GFRNONAA >60 07/13/2024 1248   GFRNONAA >89 12/29/2013 0942   GFRAA 127 12/08/2019 1210   GFRAA >89 12/29/2013 0942   Lab Results  Component Value Date   HGBA1C 6.2 (H) 06/24/2024   HGBA1C 6.0 12/31/2012   Lab Results  Component Value Date   INSULIN  35.5 (H) 01/29/2024   INSULIN  77.3 (H) 01/03/2021   Lab Results  Component Value Date   TSH 0.736 01/29/2024   CBC    Component Value Date/Time   WBC 11.0 (H) 07/13/2024 1248   WBC 9.0 02/05/2024 0501   RBC 4.33 07/13/2024 1248   HGB 11.6 (L) 07/13/2024 1248   HGB 11.3 02/10/2024 1201   HCT 35.9 (L) 07/13/2024 1248   HCT 34.7 02/10/2024 1201   PLT 266 07/13/2024 1248   PLT 342 02/10/2024 1201   MCV 82.9 07/13/2024 1248   MCV 84 02/10/2024 1201   MCH 26.8 07/13/2024 1248   MCHC 32.3 07/13/2024 1248  RDW 15.3 07/13/2024 1248   RDW 15.0 02/10/2024 1201   Iron Studies    Component Value Date/Time   IRON 27 02/10/2024 1201   TIBC 245 (L) 02/10/2024 1201   FERRITIN 384 (H) 02/10/2024 1201   IRONPCTSAT 11 (L) 02/10/2024 1201   Lipid Panel     Component Value Date/Time   CHOL 143 02/10/2024 1201   TRIG 113 02/10/2024 1201   HDL 45 02/10/2024 1201   CHOLHDL 3.2 02/10/2024 1201   CHOLHDL 6.3 (H) 01/08/2017 0947   VLDL 40 (H) 01/08/2017 0947   LDLCALC 77 02/10/2024 1201   Hepatic Function Panel     Component Value Date/Time   PROT 6.6 07/13/2024 1248   PROT 8.2 02/24/2024 1217   ALBUMIN 4.1 07/13/2024 1248   ALBUMIN 3.6 (L) 02/24/2024 1217   AST 16  07/13/2024 1248   ALT 15 07/13/2024 1248   ALKPHOS 41 07/13/2024 1248   BILITOT 0.5 07/13/2024 1248      Component Value Date/Time   TSH 0.736 01/29/2024 1553   Nutritional Lab Results  Component Value Date   VD25OH 66.9 01/29/2024   VD25OH 70.4 07/29/2023   VD25OH 63.8 03/25/2023     Assessment and Plan Assessment & Plan Class 3 Obesity Following the category two eating plan 90% of the time and engaging in physical activity by walking 25 minutes three days per week. Working on meeting fruits and vegetable goals, hydrating adequately, and not skipping meals. Getting 7-9 hours of sleep per night, beneficial for weight management. Difficulty consuming all protein, particularly in the morning, and sometimes experiences nausea if going too long without eating. Lost six pounds since last month, indicating progress in weight management. - Continue category two eating plan - Encourage walking 25 minutes three days per week - Recommend protein shakes in the morning if unable to eat solid food - Provide soup and enchilada recipes to support dietary goals  Essential Hypertension Blood pressure initially elevated at 140/85 mmHg but improved to 136/81 mmHg upon repeat measurement. Currently on losartan  50 mg daily for hypertension. Lifestyle modifications including diet, exercise, and weight loss are being implemented to manage blood pressure. - Refill losartan  50 mg daily - Continue lifestyle modifications including diet, exercise, and weight loss       Chrisma was informed of the importance of frequent follow up visits to maximize her success with intensive lifestyle modifications for her obesity and obesity related health conditions as recommended by USPSTF and CMS guidelines   Louann Penton, MD

## 2024-07-20 ENCOUNTER — Inpatient Hospital Stay

## 2024-07-20 VITALS — BP 127/68 | HR 84 | Temp 97.8°F | Resp 19

## 2024-07-20 DIAGNOSIS — Z5111 Encounter for antineoplastic chemotherapy: Secondary | ICD-10-CM | POA: Diagnosis not present

## 2024-07-20 DIAGNOSIS — C9000 Multiple myeloma not having achieved remission: Secondary | ICD-10-CM

## 2024-07-20 LAB — CBC WITH DIFFERENTIAL (CANCER CENTER ONLY)
Abs Immature Granulocytes: 0.15 K/uL — ABNORMAL HIGH (ref 0.00–0.07)
Basophils Absolute: 0 K/uL (ref 0.0–0.1)
Basophils Relative: 0 %
Eosinophils Absolute: 0.2 K/uL (ref 0.0–0.5)
Eosinophils Relative: 2 %
HCT: 36.5 % (ref 36.0–46.0)
Hemoglobin: 11.9 g/dL — ABNORMAL LOW (ref 12.0–15.0)
Immature Granulocytes: 2 %
Lymphocytes Relative: 22 %
Lymphs Abs: 2.3 K/uL (ref 0.7–4.0)
MCH: 26.9 pg (ref 26.0–34.0)
MCHC: 32.6 g/dL (ref 30.0–36.0)
MCV: 82.6 fL (ref 80.0–100.0)
Monocytes Absolute: 0.7 K/uL (ref 0.1–1.0)
Monocytes Relative: 6 %
Neutro Abs: 7 K/uL (ref 1.7–7.7)
Neutrophils Relative %: 68 %
Platelet Count: 230 K/uL (ref 150–400)
RBC: 4.42 MIL/uL (ref 3.87–5.11)
RDW: 15.2 % (ref 11.5–15.5)
WBC Count: 10.3 K/uL (ref 4.0–10.5)
nRBC: 0.2 % (ref 0.0–0.2)

## 2024-07-20 LAB — CMP (CANCER CENTER ONLY)
ALT: 15 U/L (ref 0–44)
AST: 24 U/L (ref 15–41)
Albumin: 4.3 g/dL (ref 3.5–5.0)
Alkaline Phosphatase: 47 U/L (ref 38–126)
Anion gap: 6 (ref 5–15)
BUN: 8 mg/dL (ref 6–20)
CO2: 30 mmol/L (ref 22–32)
Calcium: 9.8 mg/dL (ref 8.9–10.3)
Chloride: 105 mmol/L (ref 98–111)
Creatinine: 0.63 mg/dL (ref 0.44–1.00)
GFR, Estimated: >60 mL/min (ref >60)
Glucose, Bld: 94 mg/dL (ref 70–99)
Potassium: 3.2 mmol/L — ABNORMAL LOW (ref 3.5–5.1)
Sodium: 141 mmol/L (ref 135–145)
Total Bilirubin: 0.5 mg/dL (ref 0.0–1.2)
Total Protein: 7.2 g/dL (ref 6.5–8.1)

## 2024-07-20 MED ORDER — DIPHENHYDRAMINE HCL 25 MG PO CAPS
50.0000 mg | ORAL_CAPSULE | Freq: Once | ORAL | Status: AC
  Administered 2024-07-20: 50 mg via ORAL
  Filled 2024-07-20: qty 2

## 2024-07-20 MED ORDER — BORTEZOMIB CHEMO SQ INJECTION 3.5 MG (2.5MG/ML)
1.5000 mg/m2 | Freq: Once | INTRAMUSCULAR | Status: AC
  Administered 2024-07-20: 3.25 mg via SUBCUTANEOUS
  Filled 2024-07-20: qty 1.3

## 2024-07-20 MED ORDER — ACETAMINOPHEN 325 MG PO TABS
650.0000 mg | ORAL_TABLET | Freq: Once | ORAL | Status: AC
  Administered 2024-07-20: 650 mg via ORAL
  Filled 2024-07-20: qty 2

## 2024-07-20 MED ORDER — DARATUMUMAB-HYALURONIDASE-FIHJ 1800-30000 MG-UT/15ML ~~LOC~~ SOLN
1800.0000 mg | Freq: Once | SUBCUTANEOUS | Status: AC
  Administered 2024-07-20: 1800 mg via SUBCUTANEOUS
  Filled 2024-07-20: qty 15

## 2024-07-20 MED ORDER — FAMOTIDINE 20 MG PO TABS
20.0000 mg | ORAL_TABLET | Freq: Once | ORAL | Status: AC
  Administered 2024-07-20: 20 mg via ORAL
  Filled 2024-07-20: qty 1

## 2024-07-20 MED ORDER — DEXAMETHASONE 6 MG PO TABS
20.0000 mg | ORAL_TABLET | Freq: Once | ORAL | Status: AC
  Administered 2024-07-20: 20 mg via ORAL
  Filled 2024-07-20: qty 2

## 2024-07-20 NOTE — Patient Instructions (Signed)
 [facility] - A DEPT OF [place]  Discharge Instructions: Thank you for choosing [facility] to provide your oncology and hematology care.   If you have a lab appointment with the Cancer Center, please go directly to the Cancer Center and check in at the registration area.   Wear comfortable clothing and clothing appropriate for easy access to any Portacath or PICC line.   We strive to give you quality time with your provider. You may need to reschedule your appointment if you arrive late (15 or more minutes).  Arriving late affects you and other patients whose appointments are after yours.  Also, if you miss three or more appointments without notifying the office, you may be dismissed from the clinic at the provider's discretion.      For prescription refill requests, have your pharmacy contact our office and allow 72 hours for refills to be completed.    Today you received the following chemotherapy and/or immunotherapy agents: bortezomib  SQ (VELCADE ), daratumumab -hyaluronidase -fihj (DARZALEX  FASPRO)    To help prevent nausea and vomiting after your treatment, we encourage you to take your nausea medication as directed.  BELOW ARE SYMPTOMS THAT SHOULD BE REPORTED IMMEDIATELY: *FEVER GREATER THAN 100.4 F (38 C) OR HIGHER *CHILLS OR SWEATING *NAUSEA AND VOMITING THAT IS NOT CONTROLLED WITH YOUR NAUSEA MEDICATION *UNUSUAL SHORTNESS OF BREATH *UNUSUAL BRUISING OR BLEEDING *URINARY PROBLEMS (pain or burning when urinating, or frequent urination) *BOWEL PROBLEMS (unusual diarrhea, constipation, pain near the anus) TENDERNESS IN MOUTH AND THROAT WITH OR WITHOUT PRESENCE OF ULCERS (sore throat, sores in mouth, or a toothache) UNUSUAL RASH, SWELLING OR PAIN  UNUSUAL VAGINAL DISCHARGE OR ITCHING   Items with * indicate a potential emergency and should be followed up as soon as possible or go to the Emergency Department if any problems should  occur.  Please show the CHEMOTHERAPY ALERT CARD or IMMUNOTHERAPY ALERT CARD at check-in to the Emergency Department and triage nurse.  Should you have questions after your visit or need to cancel or reschedule your appointment, please contact [facility] DE[facility]  Dept: [phone]  and follow the prompts.  Office hours are 8:00 a.m. to 4:30 p.m. Monday - Friday. Please note that voicemails left after 4:00 p.m. may not be returned until the following business day.  We are closed weekends and major holidays. You have access to a nurse at all times for urgent questions. Please call the main number to the clinic Dept: [phone] and follow the prompts.   For any non-urgent questions, you may also contact your provider using MyChart. We now offer e-Visits for anyone 25 and older to request care online for non-urgent symptoms. For details visit [email or web address].   Also download the MyChart app! Go to the app store, search MyChart, open the app, select [place], and log in with your MyChart username and password.

## 2024-07-21 ENCOUNTER — Other Ambulatory Visit (HOSPITAL_COMMUNITY): Payer: Self-pay

## 2024-07-21 ENCOUNTER — Other Ambulatory Visit: Payer: Self-pay | Admitting: Family Medicine

## 2024-07-21 ENCOUNTER — Other Ambulatory Visit: Payer: Self-pay

## 2024-07-21 MED ORDER — POTASSIUM CHLORIDE CRYS ER 20 MEQ PO TBCR
20.0000 meq | EXTENDED_RELEASE_TABLET | Freq: Every day | ORAL | 2 refills | Status: DC
  Filled 2024-07-21: qty 30, 30d supply, fill #0

## 2024-07-28 ENCOUNTER — Encounter: Payer: Self-pay | Admitting: Hematology

## 2024-07-28 ENCOUNTER — Other Ambulatory Visit

## 2024-07-28 ENCOUNTER — Ambulatory Visit

## 2024-07-29 ENCOUNTER — Inpatient Hospital Stay (HOSPITAL_BASED_OUTPATIENT_CLINIC_OR_DEPARTMENT_OTHER): Admitting: Hematology

## 2024-07-29 ENCOUNTER — Inpatient Hospital Stay

## 2024-07-29 ENCOUNTER — Other Ambulatory Visit (HOSPITAL_COMMUNITY): Payer: Self-pay

## 2024-07-29 ENCOUNTER — Ambulatory Visit: Admitting: Hematology

## 2024-07-29 ENCOUNTER — Other Ambulatory Visit

## 2024-07-29 ENCOUNTER — Encounter: Payer: Self-pay | Admitting: Hematology

## 2024-07-29 VITALS — BP 136/69 | HR 94 | Temp 98.7°F | Resp 18 | Wt 249.5 lb

## 2024-07-29 DIAGNOSIS — Z5111 Encounter for antineoplastic chemotherapy: Secondary | ICD-10-CM | POA: Diagnosis not present

## 2024-07-29 DIAGNOSIS — C9000 Multiple myeloma not having achieved remission: Secondary | ICD-10-CM

## 2024-07-29 LAB — CMP (CANCER CENTER ONLY)
ALT: 12 U/L (ref 0–44)
AST: 46 U/L — ABNORMAL HIGH (ref 15–41)
Albumin: 4.2 g/dL (ref 3.5–5.0)
Alkaline Phosphatase: 57 U/L (ref 38–126)
Anion gap: 6 (ref 5–15)
BUN: 10 mg/dL (ref 6–20)
CO2: 32 mmol/L (ref 22–32)
Calcium: 10.9 mg/dL — ABNORMAL HIGH (ref 8.9–10.3)
Chloride: 102 mmol/L (ref 98–111)
Creatinine: 0.68 mg/dL (ref 0.44–1.00)
GFR, Estimated: >60 mL/min (ref >60)
Glucose, Bld: 118 mg/dL — ABNORMAL HIGH (ref 70–99)
Potassium: 3.2 mmol/L — ABNORMAL LOW (ref 3.5–5.1)
Sodium: 140 mmol/L (ref 135–145)
Total Bilirubin: 0.5 mg/dL (ref 0.0–1.2)
Total Protein: 8.1 g/dL (ref 6.5–8.1)

## 2024-07-29 LAB — CBC WITH DIFFERENTIAL (CANCER CENTER ONLY)
Abs Immature Granulocytes: 0.26 K/uL — ABNORMAL HIGH (ref 0.00–0.07)
Basophils Absolute: 0 K/uL (ref 0.0–0.1)
Basophils Relative: 0 %
Eosinophils Absolute: 0.2 K/uL (ref 0.0–0.5)
Eosinophils Relative: 2 %
HCT: 37.7 % (ref 36.0–46.0)
Hemoglobin: 12.5 g/dL (ref 12.0–15.0)
Immature Granulocytes: 3 %
Lymphocytes Relative: 20 %
Lymphs Abs: 1.8 K/uL (ref 0.7–4.0)
MCH: 27.1 pg (ref 26.0–34.0)
MCHC: 33.2 g/dL (ref 30.0–36.0)
MCV: 81.6 fL (ref 80.0–100.0)
Monocytes Absolute: 1 K/uL (ref 0.1–1.0)
Monocytes Relative: 11 %
Neutro Abs: 5.9 K/uL (ref 1.7–7.7)
Neutrophils Relative %: 64 %
Platelet Count: 166 K/uL (ref 150–400)
RBC: 4.62 MIL/uL (ref 3.87–5.11)
RDW: 15.4 % (ref 11.5–15.5)
WBC Count: 9.1 K/uL (ref 4.0–10.5)
nRBC: 0.2 % (ref 0.0–0.2)

## 2024-07-29 LAB — LACTATE DEHYDROGENASE: LDH: 928 U/L — ABNORMAL HIGH (ref 98–192)

## 2024-07-29 MED ORDER — DIPHENHYDRAMINE HCL 25 MG PO CAPS
50.0000 mg | ORAL_CAPSULE | Freq: Once | ORAL | Status: AC
  Administered 2024-07-29: 50 mg via ORAL
  Filled 2024-07-29: qty 2

## 2024-07-29 MED ORDER — ACETAMINOPHEN 325 MG PO TABS
650.0000 mg | ORAL_TABLET | Freq: Once | ORAL | Status: AC
  Administered 2024-07-29: 650 mg via ORAL
  Filled 2024-07-29: qty 2

## 2024-07-29 MED ORDER — DARATUMUMAB-HYALURONIDASE-FIHJ 1800-30000 MG-UT/15ML ~~LOC~~ SOLN
1800.0000 mg | Freq: Once | SUBCUTANEOUS | Status: AC
  Administered 2024-07-29: 1800 mg via SUBCUTANEOUS
  Filled 2024-07-29: qty 15

## 2024-07-29 MED ORDER — FAMOTIDINE 20 MG PO TABS
20.0000 mg | ORAL_TABLET | Freq: Once | ORAL | Status: AC
  Administered 2024-07-29: 20 mg via ORAL
  Filled 2024-07-29: qty 1

## 2024-07-29 MED ORDER — DEXAMETHASONE 6 MG PO TABS
20.0000 mg | ORAL_TABLET | Freq: Once | ORAL | Status: AC
  Administered 2024-07-29: 20 mg via ORAL
  Filled 2024-07-29: qty 2

## 2024-07-29 MED ORDER — POTASSIUM CHLORIDE CRYS ER 20 MEQ PO TBCR
20.0000 meq | EXTENDED_RELEASE_TABLET | Freq: Every day | ORAL | 2 refills | Status: DC
  Filled 2024-07-29: qty 30, 30d supply, fill #0
  Filled 2024-08-05 – 2024-08-25 (×2): qty 30, 30d supply, fill #1

## 2024-07-29 NOTE — Patient Instructions (Signed)
 [facility]  Discharge Instructions: Per Dr. Onesimo- stop taking Crestor  (Rouvastatin) and inform prescribing MD of action.  Thank you for choosing [facility] to provide your oncology and hematology care.   If you have a lab appointment with the Cancer Center, please go directly to the Cancer Center and check in at the registration area.   Wear comfortable clothing and clothing appropriate for easy access to any Portacath or PICC line.   We strive to give you quality time with your provider. You may need to reschedule your appointment if you arrive late (15 or more minutes).  Arriving late affects you and other patients whose appointments are after yours.  Also, if you miss three or more appointments without notifying the office, you may be dismissed from the clinic at the provider's discretion.      For prescription refill requests, have your pharmacy contact our office and allow 72 hours for refills to be completed.    Today you received the following chemotherapy and/or immunotherapy agents: Daratumumab -hyaluronidase -fihj (DARZALEX  FASPRO)    To help prevent nausea and vomiting after your treatment, we encourage you to take your nausea medication as directed.  BELOW ARE SYMPTOMS THAT SHOULD BE REPORTED IMMEDIATELY: *FEVER GREATER THAN 100.4 F (38 C) OR HIGHER *CHILLS OR SWEATING *NAUSEA AND VOMITING THAT IS NOT CONTROLLED WITH YOUR NAUSEA MEDICATION *UNUSUAL SHORTNESS OF BREATH *UNUSUAL BRUISING OR BLEEDING *URINARY PROBLEMS (pain or burning when urinating, or frequent urination) *BOWEL PROBLEMS (unusual diarrhea, constipation, pain near the anus) TENDERNESS IN MOUTH AND THROAT WITH OR WITHOUT PRESENCE OF ULCERS (sore throat, sores in mouth, or a toothache) UNUSUAL RASH, SWELLING OR PAIN  UNUSUAL VAGINAL DISCHARGE OR ITCHING   Items with * indicate a potential emergency and should be followed up as soon as possible  or go to the Emergency Department if any problems should occur.  Please show the CHEMOTHERAPY ALERT CARD or IMMUNOTHERAPY ALERT CARD at check-in to the Emergency Department and triage nurse.  Should you have questions after your visit or need to cancel or reschedule your appointment, please contact [facility]  Dept: [phone]  and follow the prompts.  Office hours are 8:00 a.m. to 4:30 p.m. Monday - Friday. Please note that voicemails left after 4:00 p.m. may not be returned until the following business day.  We are closed weekends and major holidays. You have access to a nurse at all times for urgent questions. Please call the main number to the clinic Dept: [phone] and follow the prompts.   For any non-urgent questions, you may also contact your provider using MyChart. We now offer e-Visits for anyone 69 and older to request care online for non-urgent symptoms. For details visit [email or web address].   Also download the MyChart app! Go to the app store, search MyChart, open the app, select , and log in with your MyChart username and password.

## 2024-07-30 LAB — KAPPA/LAMBDA LIGHT CHAINS
Kappa free light chain: 3.3 mg/L (ref 3.3–19.4)
Kappa, lambda light chain ratio: 0.87 (ref 0.26–1.65)
Lambda free light chains: 3.8 mg/L — ABNORMAL LOW (ref 5.7–26.3)

## 2024-07-31 LAB — BETA 2 MICROGLOBULIN, SERUM: Beta-2 Microglobulin: 3.1 mg/L — ABNORMAL HIGH (ref 0.6–2.4)

## 2024-08-02 LAB — MULTIPLE MYELOMA PANEL, SERUM
Albumin SerPl Elph-Mcnc: 3.2 g/dL (ref 2.9–4.4)
Albumin/Glob SerPl: 0.9 (ref 0.7–1.7)
Alpha 1: 0.3 g/dL (ref 0.0–0.4)
Alpha2 Glob SerPl Elph-Mcnc: 1 g/dL (ref 0.4–1.0)
B-Globulin SerPl Elph-Mcnc: 1.1 g/dL (ref 0.7–1.3)
Gamma Glob SerPl Elph-Mcnc: 1.5 g/dL (ref 0.4–1.8)
Globulin, Total: 4 g/dL — ABNORMAL HIGH (ref 2.2–3.9)
IgA: 7 mg/dL — ABNORMAL LOW (ref 87–352)
IgG (Immunoglobin G), Serum: 1890 mg/dL — ABNORMAL HIGH (ref 586–1602)
IgM (Immunoglobulin M), Srm: 7 mg/dL — ABNORMAL LOW (ref 26–217)
M Protein SerPl Elph-Mcnc: 1.3 g/dL — ABNORMAL HIGH
Total Protein ELP: 7.2 g/dL (ref 6.0–8.5)

## 2024-08-04 ENCOUNTER — Encounter (INDEPENDENT_AMBULATORY_CARE_PROVIDER_SITE_OTHER): Payer: Self-pay | Admitting: Family Medicine

## 2024-08-04 ENCOUNTER — Ambulatory Visit (INDEPENDENT_AMBULATORY_CARE_PROVIDER_SITE_OTHER): Admitting: Family Medicine

## 2024-08-04 VITALS — BP 136/84 | HR 91 | Temp 98.4°F | Ht 61.5 in | Wt 241.0 lb

## 2024-08-04 DIAGNOSIS — E559 Vitamin D deficiency, unspecified: Secondary | ICD-10-CM | POA: Diagnosis not present

## 2024-08-04 DIAGNOSIS — E66813 Obesity, class 3: Secondary | ICD-10-CM | POA: Diagnosis not present

## 2024-08-04 DIAGNOSIS — Z6841 Body Mass Index (BMI) 40.0 and over, adult: Secondary | ICD-10-CM

## 2024-08-04 DIAGNOSIS — R5383 Other fatigue: Secondary | ICD-10-CM | POA: Diagnosis not present

## 2024-08-04 DIAGNOSIS — E441 Mild protein-calorie malnutrition: Secondary | ICD-10-CM

## 2024-08-04 DIAGNOSIS — I10 Essential (primary) hypertension: Secondary | ICD-10-CM | POA: Diagnosis not present

## 2024-08-04 DIAGNOSIS — C9000 Multiple myeloma not having achieved remission: Secondary | ICD-10-CM | POA: Diagnosis not present

## 2024-08-04 DIAGNOSIS — R7303 Prediabetes: Secondary | ICD-10-CM | POA: Diagnosis not present

## 2024-08-04 NOTE — Progress Notes (Signed)
 Office: [phone]  /  Fax: [phone]  WEIGHT SUMMARY AND BIOMETRICS  Anthropometric Measurements Height: 5' 1.5 (1.562 m) Weight: 241 lb (109.3 kg) BMI (Calculated): 44.8 Weight at Last Visit: 253 lb Weight Lost Since Last Visit: 12 lb Weight Gained Since Last Visit: 0 Starting Weight: 324 lb Total Weight Loss (lbs): 83 lb (37.6 kg) Peak Weight: 324 lb   Body Composition  Body Fat %: 55.6 % Fat Mass (lbs): 134.2 lbs Muscle Mass (lbs): 101.6 lbs Visceral Fat Rating : 19   Other Clinical Data Fasting: no Labs: no Today's Visit #: 61 Starting Date: 01/03/21    Chief Complaint: OBESITY   History of Present Illness Carla Hanson is a 58 year old female with obesity and multiple myeloma who presents for obesity treatment and progress assessment.  She is currently undergoing treatment for obesity and multiple myeloma, receiving chemotherapy once a week, which results in fatigue. One chemotherapy medication was discontinued due to neuropathy, and rosuvastatin  was stopped because of severe leg pain and chest discomfort, both of which have improved. Her M protein levels have increased from 0.5 to 1.3, and she is awaiting further discussion on a maintenance plan.  She has lost 12 pounds over the last month. She follows a category two eating plan 85% of the time and walks for exercise 20 minutes three days a week. However, she is not consistently meeting her protein goals and sometimes struggles with meals due to fatigue. Her mother has been supportive in ensuring she eats, and she manages to consume tomato soup, broth, and occasionally air-fried chicken wings. She also uses Premier Protein drinks to supplement her diet.  She has a history of hypertension, controlled with losartan  50 mg daily, with her blood pressure today at 136/84 mmHg. She also has a history of vitamin D  deficiency and takes over-the-counter vitamin D3 5000 IU daily. Her calcium  levels were  noted to be slightly elevated at 10.9 mg/dL, and she is due for a vitamin D  level check.  She reports sleeping 6-7 hours per night and takes naps during the day. Pain sometimes affects her sleep, and she currently manages it with Tylenol , which provides some relief. She has not discussed her pain with her oncologist yet. She experiences a sore throat from her inhaler use, which has improved, and she has used the inhaler twice recently.  Her last hemoglobin A1c was 6.2, indicating prediabetes, and she is due for a recheck.      PHYSICAL EXAM:  Blood pressure 136/84, pulse 91, temperature 98.4 F (36.9 C), height 5' 1.5 (1.562 m), weight 241 lb (109.3 kg), SpO2 96%. Body mass index is 44.8 kg/m.  DIAGNOSTIC DATA REVIEWED:  BMET    Component Value Date/Time   NA 140 07/29/2024 0924   NA 145 (H) 02/24/2024 1217   K 3.2 (L) 07/29/2024 0924   CL 102 07/29/2024 0924   CO2 32 07/29/2024 0924   GLUCOSE 118 (H) 07/29/2024 0924   BUN 10 07/29/2024 0924   BUN 16 02/24/2024 1217   CREATININE 0.68 07/29/2024 0924   CREATININE 0.57 11/09/2014 1032   CALCIUM  10.9 (H) 07/29/2024 0924   GFRNONAA >60 07/29/2024 0924   GFRNONAA >89 12/29/2013 0942   GFRAA 127 12/08/2019 1210   GFRAA >89 12/29/2013 0942   Lab Results  Component Value Date   HGBA1C 6.2 (H) 06/24/2024   HGBA1C 6.0 12/31/2012   Lab Results  Component Value Date   INSULIN  35.5 (H) 01/29/2024  INSULIN  77.3 (H) 01/03/2021   Lab Results  Component Value Date   TSH 0.736 01/29/2024   CBC    Component Value Date/Time   WBC 9.1 07/29/2024 0924   WBC 9.0 02/05/2024 0501   RBC 4.62 07/29/2024 0924   HGB 12.5 07/29/2024 0924   HGB 11.3 02/10/2024 1201   HCT 37.7 07/29/2024 0924   HCT 34.7 02/10/2024 1201   PLT 166 07/29/2024 0924   PLT 342 02/10/2024 1201   MCV 81.6 07/29/2024 0924   MCV 84 02/10/2024 1201   MCH 27.1 07/29/2024 0924   MCHC 33.2 07/29/2024 0924   RDW 15.4 07/29/2024 0924   RDW 15.0 02/10/2024 1201    Iron Studies    Component Value Date/Time   IRON 27 02/10/2024 1201   TIBC 245 (L) 02/10/2024 1201   FERRITIN 384 (H) 02/10/2024 1201   IRONPCTSAT 11 (L) 02/10/2024 1201   Lipid Panel     Component Value Date/Time   CHOL 143 02/10/2024 1201   TRIG 113 02/10/2024 1201   HDL 45 02/10/2024 1201   CHOLHDL 3.2 02/10/2024 1201   CHOLHDL 6.3 (H) 01/08/2017 0947   VLDL 40 (H) 01/08/2017 0947   LDLCALC 77 02/10/2024 1201   Hepatic Function Panel     Component Value Date/Time   PROT 8.1 07/29/2024 0924   PROT 8.2 02/24/2024 1217   ALBUMIN 4.2 07/29/2024 0924   ALBUMIN 3.6 (L) 02/24/2024 1217   AST 46 (H) 07/29/2024 0924   ALT 12 07/29/2024 0924   ALKPHOS 57 07/29/2024 0924   BILITOT 0.5 07/29/2024 0924      Component Value Date/Time   TSH 0.736 01/29/2024 1553   Nutritional Lab Results  Component Value Date   VD25OH 66.9 01/29/2024   VD25OH 70.4 07/29/2023   VD25OH 63.8 03/25/2023     Assessment and Plan Assessment & Plan Obesity, class 3 Obesity management is ongoing with a focus on diet and exercise. She has lost 12 pounds over the last month, which is more than expected and may indicate malnutrition. She is following the category two eating plan 85% of the time and is walking for exercise 20 minutes three days a week. - Continue category two eating plan, eating as much as possible - Encourage walking for exercise 20 minutes three days a week - Monitor weight loss to ensure it is not excessive  Malnutrition, likely protein-calorie There is concern for protein-calorie malnutrition due to excessive weight loss and decreased appetite, likely exacerbated by chemotherapy. She is not meeting her protein goals and is trying to hydrate and not skip meals. She is encouraged to consume protein-rich foods and drinks to prevent muscle mass loss. - Encourage consumption of protein-rich foods such as scrambled eggs, yogurt, cottage cheese, and tuna pouches - Recommend at least  one Premier Protein drink daily, with the option for two - Monitor nutritional intake and weight  Essential hypertension Hypertension is currently well-controlled with losartan  50 mg daily. Blood pressure today is 136/84 mmHg. - Continue losartan  50 mg daily - Continue diet, exercise and weight loss as discussed today as an important part of the treatment plan   Multiple myeloma, on chemotherapy Currently undergoing chemotherapy for multiple myeloma. She reports fatigue and some pain, which may be related to the condition. M protein levels have increased from 0.5 to 1.3, and she is awaiting further discussion with her oncologist regarding treatment plans and maintenance therapy. - Encourage communication with oncologist regarding M protein levels and treatment plan - Discuss  pain management with oncologist  Vitamin D  deficiency Vitamin D  levels have not been rechecked recently. She is taking over-the-counter vitamin D3 5000 IU daily. Elevated calcium  levels noted at 10.9, possibly related to vitamin D  intake. - Order vitamin D  level - Monitor calcium  levels - Continue vitamin D3 5000 IU daily  Fatigue likely secondary to chemotherapy and malnutrition Fatigue is likely multifactorial, related to chemotherapy and potential malnutrition. She reports sleeping 6-7 hours per night and taking naps during the day. Pain may also be contributing to fatigue. - Encourage adequate rest and sleep - Discuss pain management with oncologist  Prediabetes Prediabetes is being monitored. The last hemoglobin A1c was 6.2, showing improvement. - Order hemoglobin A1c - Continue diet, exercise and weight loss as discussed today as an important part of the treatment plan       Fayne was informed of the importance of frequent follow up visits to maximize her success with intensive lifestyle modifications for her obesity and obesity related health conditions as recommended by USPSTF and CMS  guidelines   Louann Penton, MD

## 2024-08-05 ENCOUNTER — Other Ambulatory Visit: Payer: Self-pay | Admitting: Family Medicine

## 2024-08-05 ENCOUNTER — Other Ambulatory Visit: Payer: Self-pay | Admitting: Hematology

## 2024-08-05 ENCOUNTER — Inpatient Hospital Stay: Attending: Hematology

## 2024-08-05 ENCOUNTER — Inpatient Hospital Stay

## 2024-08-05 DIAGNOSIS — Z86711 Personal history of pulmonary embolism: Secondary | ICD-10-CM | POA: Insufficient documentation

## 2024-08-05 DIAGNOSIS — C9000 Multiple myeloma not having achieved remission: Secondary | ICD-10-CM | POA: Insufficient documentation

## 2024-08-05 DIAGNOSIS — Z87891 Personal history of nicotine dependence: Secondary | ICD-10-CM | POA: Insufficient documentation

## 2024-08-05 DIAGNOSIS — Z86718 Personal history of other venous thrombosis and embolism: Secondary | ICD-10-CM | POA: Insufficient documentation

## 2024-08-05 DIAGNOSIS — J3089 Other allergic rhinitis: Secondary | ICD-10-CM

## 2024-08-05 LAB — HEMOGLOBIN A1C
Est. average glucose Bld gHb Est-mCnc: 143 mg/dL
Hgb A1c MFr Bld: 6.6 % — ABNORMAL HIGH (ref 4.8–5.6)

## 2024-08-05 LAB — VITAMIN D 25 HYDROXY (VIT D DEFICIENCY, FRACTURES): Vit D, 25-Hydroxy: 43.8 ng/mL (ref 30.0–100.0)

## 2024-08-06 ENCOUNTER — Other Ambulatory Visit (HOSPITAL_COMMUNITY): Payer: Self-pay

## 2024-08-06 ENCOUNTER — Encounter: Payer: Self-pay | Admitting: Hematology

## 2024-08-06 ENCOUNTER — Ambulatory Visit (HOSPITAL_COMMUNITY)
Admission: RE | Admit: 2024-08-06 | Discharge: 2024-08-06 | Disposition: A | Discharge status: Home / Self Care | Source: Ambulatory Visit | Attending: Hematology | Admitting: Hematology

## 2024-08-06 ENCOUNTER — Other Ambulatory Visit: Payer: Self-pay | Admitting: Hematology

## 2024-08-06 ENCOUNTER — Other Ambulatory Visit: Payer: Self-pay

## 2024-08-06 DIAGNOSIS — C9000 Multiple myeloma not having achieved remission: Secondary | ICD-10-CM

## 2024-08-06 MED ORDER — CETIRIZINE HCL 10 MG PO TABS
10.0000 mg | ORAL_TABLET | Freq: Every day | ORAL | 2 refills | Status: AC
  Filled 2024-08-06: qty 90, 90d supply, fill #0
  Filled 2024-08-25: qty 30, 30d supply, fill #0
  Filled 2024-09-23: qty 30, 30d supply, fill #1
  Filled 2024-11-27: qty 30, 30d supply, fill #2

## 2024-08-06 MED ORDER — TIZANIDINE HCL 4 MG PO TABS
4.0000 mg | ORAL_TABLET | Freq: Two times a day (BID) | ORAL | 3 refills | Status: DC | PRN
  Filled 2024-08-06 – 2024-10-16 (×2): qty 60, 30d supply, fill #0

## 2024-08-06 MED ORDER — ACYCLOVIR 400 MG PO TABS
400.0000 mg | ORAL_TABLET | Freq: Two times a day (BID) | ORAL | 5 refills | Status: AC
  Filled 2024-08-06 – 2024-08-25 (×2): qty 60, 30d supply, fill #0
  Filled 2024-09-23: qty 60, 30d supply, fill #1
  Filled 2024-11-27: qty 60, 30d supply, fill #2

## 2024-08-10 ENCOUNTER — Encounter: Payer: Self-pay | Admitting: Hematology

## 2024-08-10 NOTE — Progress Notes (Signed)
 HEMATOLOGY/ONCOLOGY CLINIC NOTE  Date of Service: .07/29/2024  Patient Care Team: Anders Otto DASEN, MD as PCP - General (Family Medicine) Rosemarie [name] RAMAN, MD as Consulting Physician (Neurology) Wonda Cy BROCKS, RD as Dietitian (Family Medicine) Onesimo Emaline Brink, MD as Consulting Physician (Hematology)  CHIEF COMPLAINTS/PURPOSE OF CONSULTATION:  Follow-up for continued management of multiple myeloma  HISTORY OF PRESENTING ILLNESS:  Carla Hanson is a wonderful 58 y.o. 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   Patient has a history of hypertension, dyslipidemia, COPD, history of PE/DVT, prediabetes, obesity who notes that she was working out in the gym and had significant back pain resulting in an x-ray that showed T6 compression fracture in January 2025.   She had a follow-up with healthy weight [facility] wellness clinic on 3/27 and reported symptoms of significant weakness dizziness lightheadedness and dehydration despite drinking a lot of water.  She also noted nausea but no emesis and constipation. Patient was seen in the emergency room and noted to have severe hypercalcemia with a calcium  level of 17.8 with an albumin of 3.8, total protein was elevated at 9.1 with an increased globulin gap, potassium was 2.7. 25-hydroxy vitamin D  levels were within normal limits at 66.9. TSH was normal at 0.736. PTH levels were appropriately suppressed at 6 which is suggestive of non hyperparathyroid hypercalcemia. CBC-on admission showed normal hemoglobin of 13.1 in the context of significant hemoconcentration.  This might be hiding her baseline anemia which might declare itself once the patient's volume status is replaced.  No other cytopenias. Patient notes no acute new focal bone pains.   Patient notes no fevers no chills no night sweats. No previous history of cancers. Her last mammogram was on 02/07/2023 and  was within normal limits.  She is previously noted to have a benign lipoma in the right breast which was resected. Patient has been ex-smoker and smoked half to 1 pack a day for 35 years and quit in December 2017.   New focal symptoms. Patient has received 1 dose of Zometa  and IV fluids and calcium  levels today are down to 12 with a corrected calcium  between 13 and 14. She is receiving IV fluids potassium replacement phosphorus  replacement and calcitonin today.   Normal labs have been ordered and patient is pending whole-body skeletal survey.  24-hour urine collection for myeloma has been sent out.   INTERVAL HISTORY:  Carla Hanson is a 58 y.o. female who is here for continued evaluation and management of her multiple myeloma. She notes no acute new symptoms since her last clinic visit. No focal new bone pains.. No new dental issues. No notable toxicities from her current daratumumab  Faspro. Velcade  has been held due to some tingling and restlessness and muscle cramps in her legs which get better with walking. Recent lab results were discussed in details Discussed with patient and she is not inclined to consider bone marrow transplant for consolidation. No other infection issues. Has been losing weight in a planned fashion with diet and exercise and appears to have lost about 12 pounds thus far in the last few weeks.  MEDICAL HISTORY:  Past Medical History:  Diagnosis Date   Alcohol abuse    Anxiety    Back pain    Bipolar disorder (HCC)    Bronchitis    COPD exacerbation (HCC) 05/10/2016   Drug use    Edema 02/24/2024   History  of blood clots    Hyperlipidemia    Hypertension    Influenza A 12/01/2018   Joint pain    Left leg DVT (HCC) 09/29/2013   Lipoma    Abdomen   LIPOMA 01/20/2008   Obesity    Occasional tremors 05/12/2019   Osteoarthritis of right knee    Other fatigue    Painful menstrual periods 01/05/2012   Pneumonia    Prediabetes    Seizure (HCC)     Shortness of breath on exertion    Stroke (HCC)    Tobacco abuse     SURGICAL HISTORY: Past Surgical History:  Procedure Laterality Date   CESAREAN SECTION     CYSTECTOMY     LIPOMA EXCISION  03/2011   ORIF ANKLE FRACTURE  03/13/2012   Procedure: OPEN REDUCTION INTERNAL FIXATION (ORIF) ANKLE FRACTURE;  Surgeon: Lynwood FORBES Better, MD;  Location: [facility];  Service: Orthopedics;  Laterality: Right;   TUBAL LIGATION      SOCIAL HISTORY: Social History   Socioeconomic History   Marital status: Divorced    Spouse name: Not on file   Number of children: 4   Years of education: 10   Highest education level: Not on file  Occupational History   Occupation: stay at home  Tobacco Use   Smoking status: Former    Current packs/day: 0.00    Average packs/day: 0.5 packs/day for 35.0 years (17.5 ttl pk-yrs)    Types: Cigarettes    Start date: 11/04/1981    Quit date: 10/2016    Years since quitting: 7.8   Smokeless tobacco: Never  Vaping Use   Vaping status: Never Used  Substance and Sexual Activity   Alcohol use: No    Alcohol/week: 0.0 standard drinks of alcohol    Comment: Quit 10/2016   Drug use: Yes    Types: Marijuana   Sexual activity: Not Currently    Comment: tubal  Other Topics Concern   Not on file  Social History Narrative   Unemployed, 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  Considering nursing.    Lives at home alone   Right-handed   Caffeine: not much   Social Drivers of Corporate investment banker Strain: Low Risk  (05/14/2024)   Overall Financial Resource Strain (CARDIA)    Difficulty of Paying Living Expenses: Not hard at all  Food Insecurity: No Food Insecurity (05/14/2024)   Hunger Vital Sign    Worried About Running Out of Food in the Last Year: Never true    Ran Out of Food in the Last Year: Never true  Transportation Needs: No Transportation Needs (05/14/2024)   PRAPARE -  Administrator, Civil Service (Medical): No    Lack of Transportation (Non-Medical): No  Physical Activity: Insufficiently Active (05/14/2024)   Exercise Vital Sign    Days of Exercise per Week: 3 days    Minutes of Exercise per Session: 20 min  Stress: No Stress Concern Present (05/14/2024)   [employer] of Occupational Health - Occupational Stress Questionnaire    Feeling of Stress: Not at all  Social Connections: Moderately Integrated (05/14/2024)   Social Connection and Isolation Panel    Frequency of Communication with Friends and Family: More than three times a week    Frequency of Social Gatherings with Friends and Family: More than three times a week    Attends Religious Services: More than 4  times per year    Active Member of Clubs or Organizations: Yes    Attends Banker Meetings: 1 to 4 times per year    Marital Status: Divorced  Intimate Partner Violence: Not At Risk (02/06/2024)   Humiliation, Afraid, Rape, and Kick questionnaire    Fear of Current or Ex-Partner: No    Emotionally Abused: No    Physically Abused: No    Sexually Abused: No    FAMILY HISTORY: Family History  Problem Relation Age of Onset   Other Mother        High blood pressure runs in the family   Obesity Mother    Asthma Father    Heart attack Father    Obesity Father    Diabetes Maternal Aunt    Breast cancer Neg Hx     ALLERGIES:  is allergic to latuda  [lurasidone ].  MEDICATIONS:  Current Outpatient Medications  Medication Sig Dispense Refill   acetaminophen  (TYLENOL ) 325 MG tablet Take 650 mg by mouth every 6 (six) hours as needed for mild pain (pain score 1-3).     acyclovir  (ZOVIRAX ) 400 MG tablet Take 1 tablet (400 mg total) by mouth 2 (two) times daily. 60 tablet 5   albuterol  (PROVENTIL ) (2.5 MG/3ML) 0.083% nebulizer solution Take 3 mLs (2.5 mg total) by nebulization every 6 (six) hours as needed for wheezing or shortness of breath. 75 mL 2   albuterol   (VENTOLIN  HFA) 108 (90 Base) MCG/ACT inhaler Inhale 2 puffs into the lungs every 6 (six) hours as needed for wheezing or shortness of breath. 18 g 2   aspirin  EC 81 MG EC tablet Take 1 tablet (81 mg total) by mouth daily. 30 tablet 12   cetirizine  (ZYRTEC ) 10 MG tablet Take 1 tablet (10 mg total) by mouth daily. 90 tablet 2   ferrous sulfate  325 (65 FE) MG tablet Take 325 mg by mouth daily with breakfast.     furosemide  (LASIX ) 40 MG tablet Take 1 tablet (40 mg total) by mouth daily. 90 tablet 1   losartan  (COZAAR ) 50 MG tablet Take 1 tablet (50 mg total) by mouth daily. 90 tablet 0   ondansetron  (ZOFRAN ) 8 MG tablet Take 1 tablet (8 mg total) by mouth every 8 (eight) hours as needed for nausea or vomiting. 30 tablet 1   potassium chloride  SA (KLOR-CON  M) 20 MEQ tablet Take 1 tablet (20 mEq total) by mouth daily. 30 tablet 2   prochlorperazine  (COMPAZINE ) 10 MG tablet Take 1 tablet (10 mg total) by mouth every 6 (six) hours as needed for nausea or vomiting 30 tablet 1   rosuvastatin  (CRESTOR ) 20 MG tablet Take 1 tablet (20 mg total) by mouth daily. 90 tablet 1   tiZANidine  (ZANAFLEX ) 4 MG tablet Take 1 tablet (4 mg total) by mouth 2 (two) times daily as needed for muscle spasms. 60 tablet 3   No current facility-administered medications for this visit.    REVIEW OF SYSTEMS:   10 Point review of Systems was done is negative except as noted above.  PHYSICAL EXAMINATION: ECOG PERFORMANCE STATUS: 2 .LMP  (LMP Unknown)   . Wt Readings from Last 3 Encounters:  08/04/24 241 lb (109.3 kg)  07/29/24 249 lb 8 oz (113.2 kg)  07/19/24 253 lb (114.8 kg)  . NAD GENERAL:alert, in no acute distress and comfortable SKIN: no acute rashes, no significant lesions EYES: conjunctiva are pink and non-injected, sclera anicteric OROPHARYNX: MMM, no exudates, no oropharyngeal erythema or ulceration NECK: supple,  no JVD LYMPH:  no palpable lymphadenopathy in the cervical, axillary or inguinal regions LUNGS:  clear to auscultation b/l with normal respiratory effort HEART: regular rate & rhythm ABDOMEN:  normoactive bowel sounds , non tender, not distended. Extremity: no pedal edema PSYCH: alert & oriented x 3 with fluent speech NEURO: no focal motor/sensory deficits     LABORATORY DATA:  I have reviewed the data as listed  .    Latest Ref Rng & Units 07/29/2024    9:24 AM 07/20/2024    1:54 PM 07/13/2024   12:48 PM  CBC  WBC 4.0 - 10.5 K/uL 9.1  10.3  11.0   Hemoglobin 12.0 - 15.0 g/dL 87.4  88.0  88.3   Hematocrit 36.0 - 46.0 % 37.7  36.5  35.9   Platelets 150 - 400 K/uL 166  230  266     .    Latest Ref Rng & Units 07/29/2024    9:24 AM 07/20/2024    1:54 PM 07/13/2024   12:48 PM  CMP  Glucose 70 - 99 mg/dL 881  94  92   BUN 6 - 20 mg/dL 10  8  11    Creatinine 0.44 - 1.00 mg/dL 9.31  9.36  9.47   Sodium 135 - 145 mmol/L 140  141  139   Potassium 3.5 - 5.1 mmol/L 3.2  3.2  3.6   Chloride 98 - 111 mmol/L 102  105  105   CO2 22 - 32 mmol/L 32  30  28   Calcium  8.9 - 10.3 mg/dL 89.0  9.8  9.5   Total Protein 6.5 - 8.1 g/dL 8.1  7.2  6.6   Total Bilirubin 0.0 - 1.2 mg/dL 0.5  0.5  0.5   Alkaline Phos 38 - 126 U/L 57  47  41   AST 15 - 41 U/L 46  24  16   ALT 0 - 44 U/L 12  15  15     02/05/2024 Bone Marrow Biopsy:       RADIOGRAPHIC STUDIES: I have personally reviewed the radiological images as listed and agreed with the findings in the report. No results found.   ASSESSMENT & PLAN:   58 year old female with severe non-PTH hypercalcemia with an admission calcium  level of 17.8    1) IgG lambda multiple myeloma Myeloma FISH panel with no notable mutations Status post severely elevated calcium  level 17.8 on admission now resolved  #2 hx dehydration and volume contraction due to hypercalcemia.   #3  Hypertension #4 dyslipidemia #5 COPD #6 prediabetes #7 multiple osseous lesions concerning for multiple myeloma.  Also noted to have left femoral intertrochanteric  lesion. #8 history of T6 compression fracture was likely related to multiple myeloma.  No significant discomfort at this time. #10 h/o VTE in the past.and SAH with anticoagulation-- so cannot tolerate anticoagulation or ASA to be able to consider Revlimid  PLAN: -Discussed lab results from 07/29/2024 in detail with the patient -CBC shows normal hemoglobin of 12.5 with normal WBC count and platelets CMP shows mild hypokalemia with a potassium of 3.2 calcium  of 10.9 in the setting of mild dehydration with near normal Corrected calcium . Myeloma panel from 07/13/2024 showed bump in her M protein from 0.2 to 0.5 g/dl --from today's currently pending Kappa lambda free light chains show normal ratio Vitamin D  25-hydroxy levels are within normal limits at 44 Patient is agreeable to getting a PET CT scan and bone marrow biopsy to reevaluate degree of response to  plan subsequent treatment. She continues to decline consideration of HDT-Autologous HSCT. -Velcade  on hold at this time due to some concerns with evolving neuropathy Continue Zometa  every 28 days  - FOLLOW-UP: Per integrative scheduling Next MD visit in 3-4 weeks  .The total time spent in the appointment was 30 minutes* .  All of the patient's questions were answered with apparent satisfaction. The patient knows to call the clinic with any problems, questions or concerns.   Emaline Saran MD MS AAHIVMS [facility] Hematology/Oncology Physician [facility]  .*Total Encounter Time as defined by the Centers for Medicare and Medicaid Services includes, in addition to the face-to-face time of a patient visit (documented in the note above) non-face-to-face time: obtaining and reviewing outside history, ordering and reviewing medications, tests or procedures, care coordination (communications with other health care professionals or caregivers) and documentation in the medical record.  ADDENDUM  Myeloma panel shows bump in M protein to  1.3 g/dL suggestive of myeloma progression. Will follow-up on PET CT scan and bone marrow biopsy results with mutation profile to evaluate for subsequent treatment options.

## 2024-08-11 ENCOUNTER — Ambulatory Visit

## 2024-08-11 ENCOUNTER — Inpatient Hospital Stay

## 2024-08-11 ENCOUNTER — Other Ambulatory Visit

## 2024-08-13 DIAGNOSIS — G9341 Metabolic encephalopathy: Secondary | ICD-10-CM | POA: Diagnosis not present

## 2024-08-13 DIAGNOSIS — R531 Weakness: Secondary | ICD-10-CM | POA: Diagnosis not present

## 2024-08-13 DIAGNOSIS — I517 Cardiomegaly: Secondary | ICD-10-CM | POA: Diagnosis not present

## 2024-08-13 DIAGNOSIS — J9811 Atelectasis: Secondary | ICD-10-CM | POA: Diagnosis not present

## 2024-08-13 DIAGNOSIS — R4182 Altered mental status, unspecified: Secondary | ICD-10-CM | POA: Diagnosis not present

## 2024-08-13 DIAGNOSIS — R9431 Abnormal electrocardiogram [ECG] [EKG]: Secondary | ICD-10-CM | POA: Diagnosis not present

## 2024-08-13 DIAGNOSIS — R7989 Other specified abnormal findings of blood chemistry: Secondary | ICD-10-CM | POA: Diagnosis not present

## 2024-08-13 DIAGNOSIS — I10 Essential (primary) hypertension: Secondary | ICD-10-CM | POA: Diagnosis not present

## 2024-08-13 DIAGNOSIS — D61818 Other pancytopenia: Secondary | ICD-10-CM | POA: Diagnosis not present

## 2024-08-13 DIAGNOSIS — R079 Chest pain, unspecified: Secondary | ICD-10-CM | POA: Diagnosis not present

## 2024-08-13 DIAGNOSIS — E876 Hypokalemia: Secondary | ICD-10-CM | POA: Diagnosis not present

## 2024-08-13 DIAGNOSIS — J449 Chronic obstructive pulmonary disease, unspecified: Secondary | ICD-10-CM | POA: Diagnosis not present

## 2024-08-13 DIAGNOSIS — R630 Anorexia: Secondary | ICD-10-CM | POA: Diagnosis not present

## 2024-08-13 DIAGNOSIS — Z79899 Other long term (current) drug therapy: Secondary | ICD-10-CM | POA: Diagnosis not present

## 2024-08-13 DIAGNOSIS — C9000 Multiple myeloma not having achieved remission: Secondary | ICD-10-CM | POA: Diagnosis not present

## 2024-08-13 DIAGNOSIS — E785 Hyperlipidemia, unspecified: Secondary | ICD-10-CM | POA: Diagnosis not present

## 2024-08-16 DIAGNOSIS — D61818 Other pancytopenia: Secondary | ICD-10-CM | POA: Diagnosis not present

## 2024-08-16 DIAGNOSIS — E876 Hypokalemia: Secondary | ICD-10-CM | POA: Diagnosis not present

## 2024-08-16 DIAGNOSIS — C9000 Multiple myeloma not having achieved remission: Secondary | ICD-10-CM | POA: Diagnosis not present

## 2024-08-16 NOTE — Progress Notes (Addendum)
  Assessment Spoke with: Parent(s)  Current Home Environment Patient currently lives with: Alone  Emergency contact person name and number: Mother- Zettie [email or web address] [phone]  Type of residence: Single Level House  How many levels: 1 How many steps to enter the home: 0  ADLS/IADLS Cooking: Independent     Cleaning: Independent Bathing: Independent Dressing: Independent Driving: Totally dependent Ambulation:  Independent                                      Current Home Treatments Home Health Services: None Home Health Agency:None  Outpatient Therapy:No Outpatient Agency Name:  Durable Medical Equipment:Cane and Rollator   Dialysis No  Benefits/Providers Primary Insurance: Payor: [employer]CD Advertising copywriter / Plan: [employer]ITY / Product Type: *No Product type* /  Secondary Insurance: N/A  Prescription Coverage: yes  Pharmacy:  [facility] - [facility] - [place], [place] - [address] [place] [address] Phone: [phone] Fax: [phone]   PCP: Isla Fairly, MD   Anticipated Discharge Needs  Reports mother will provide transportation when ready for discharge.  Discharge planning needs identified: Undetermined  Modification needed to Home/physical environment: None  Discharge plan confirmed by Family .    They confirm the plan to transition to Home with family assistance. Mother reports patient was independent at baseline and living alone.

## 2024-08-17 DIAGNOSIS — D61818 Other pancytopenia: Secondary | ICD-10-CM | POA: Diagnosis not present

## 2024-08-17 DIAGNOSIS — E876 Hypokalemia: Secondary | ICD-10-CM | POA: Diagnosis not present

## 2024-08-17 DIAGNOSIS — C9000 Multiple myeloma not having achieved remission: Secondary | ICD-10-CM | POA: Diagnosis not present

## 2024-08-17 NOTE — Progress Notes (Signed)
 Admission date: 08/13/2024 Today's date: 08/17/2024 PCP: No Pcp  Subjective Carla Hanson is a 58 y.o. female being followed up for  lethargy and weakness due to malignant hypercalcemia and various electrolytes derangement. PT has history of MM. Calcium  on admission 20.8> 19>17>13>12>11.7>11.4 PT got IVF, 1 dose IV lasix , one dose of Zoledronic  acid, calcitonin  PT is seen today. She feels better today than yesterday, more alert. She will walk more with PT and get out of bed. PT is eating well K still low at 2.7 Mag 1.4 P 1.5 Hope to correct all electrolytes today and send home tomorrow  Summary:    Objective      Vitals & Measurements      BP: 137/73, Temp: 36.7 C (98.1 F), Temp Source: Temporal, Heart Rate: 70, Heart Rate Source: Monitor, Resp: 20 No intake/output data recorded.  Physical exam: PT sitting in bed, no distress, on RA Speech is clear,  HEENT WNL Lungs are clear Heart RRR Abd soft ND NT Ext: no edema    Lab Results: I have personally reviewed all lab results and images.  Results from last 7 days  Lab Units 08/17/24 0119  WBC 10*3/uL 2.2*  RBC AUTO 10*6/uL 3.33*  HEMOGLOBIN g/dL 8.7*  HEMATOCRIT % 72.7*  PLATELETS 10*3/uL 64*  MCV fL 81.7    Results from last 7 days  Lab Units 08/17/24 0119  SODIUM mmol/L 143  POTASSIUM mmol/L 2.7*  CHLORIDE mmol/L 105  CO2 mmol/L 31  BUN mg/dL 15  CREATININE mg/dL 9.31  GLUCOSE mg/dL 873*  CALCIUM  mg/dL 88.5*  PHOSPHORUS  mg/dL 1.5*  MAGNESIUM  mg/dL 8.59*  ALT U/L 13  AST U/L 41*    Assessment & Plan Hypercalcemia Got one dose Zoledronic  acid 4 mg IV Continuous cardiac monitoring Calcium  level much better Multiple myeloma Patient follows an oncologist in [place]  COPD (chronic obstructive pulmonary disease) Continue home inhalers  Hypokalemia Give KCL po 40meq for 3 doses along with IV mag x2 doses Check BMP at 4pm HTN (hypertension) Not on any antihypertensives at home Continue to  monitor  Pancytopenia Related to MM        DVT prophylaxis Diet Adult diet Regular; Thin Liquids  DVT Prophylaxis No chemical ppl due to low platelet []  Lovenox , []   Heparin , []  SCDs, []  Ambulation,   []  Eliquis, []  Xarelto , []  Coumadin  Code Status Full Code  Surrogate Decision Maker/ POA    Disposition: home  Medical Necessity: PT continues to require hospitalization for corrections of electrolytes, anticipate DC home tomorrow  Coordination of Care Time: 25 min  CC Physician No Pcp

## 2024-08-17 NOTE — Progress Notes (Signed)
 Physical Therapy Physical Therapy Visit  Patient Name: Carla Hanson MRN: [id number] Today's Date: 08/17/2024  Subjective Current Problem: Room 4217. Pt was found in bed when PTA entered room, agreeable to PT. Pt reports generalized stiffness at start of treatment.  Pain: Pain Assessment Pain Assessment:  (L knee pain reported but does not quantify.)      Objective General Visit Information: PT Last Visit PT Received On: 08/17/24   Precautions: Precautions Medical Precautions: fall risk, cognition General Assessments:   Cognition Overall Cognitive Status: Within Functional Limits Safety Judgment: Decreased awareness of need for safety Treatment: Therapeutic Exercise Therapeutic Exercise Activity 1: Pt performed the following ther exs with VI and TI for technique while supine in bed: BLE AP x10 reps, QS x10 reps, GS x10 reps, HS x0 reps, hip abd/add x10 reps- limited ROM with same with VI ro avoid rotation and to perform with inc ROM.  Therapeutic Activity Therapeutic Activity 1: Pt performed bed mobility from supine>sit with inc time/effort with no assist from staff, pt able to scoot hips towards EOB using BUEs and reports need to use restroom; pt STS up to rw with SPV with VI for hand placement/technique and t/f to [facility] using rw with CGA for safety and VI for hand placement/technique; pt able to perform hygiene care. pt STS from BS with SBA and proceeded with amb, returned to bed after with no assist from staff with stand to sit and with sit to supine.  Gait Training Gait Training Activity 1: pt ambulated 5ft with use of rw with SBA, pt with slow movements, appropriate rw management, distance to pts tolerance, no gross LOB of c/o during.  Assessment & Plan PT Assessment Evaluation/Treatment Tolerance: Patient tolerated treatment well Plan PT Plan: Skilled PT PT - Next Appointment: 08/18/24 Pt was left in bed with all needs in reach; alarm on. Encouraged pt to use bed side  commode throughout the day with NSG and wrote on board in room.  Encounter Problems     Encounter Problems (Active)     PT Problem     Patient will complete sit to stand transfer with walker and no assist     Start:  08/16/24    Expected End:  08/23/24      INTERVENTIONS:        PT Problem     Patient will ambulate 158ft distance using walker with no assistance     Start:  08/16/24    Expected End:  08/23/24           Pain - Adult

## 2024-08-17 NOTE — Consults (Signed)
 Nutrition Assessment  Name: Carla Hanson MRN:  [id number] Birth Date: 04-Sep-1966 Date of Visit:  08/17/24  Reason for Assessment: Initial Nursing Nutrition Screen and MST score of 3  Diagnosis this visit:  1. Hypercalcemia   2. Pancytopenia   3. Generalized weakness   4. Poor appetite   5. Hypokalemia   6. Elevated troponin   7. Prolonged QT interval    Past Medical History: Diagnosis Date  . Cancer (CMS/HCC) (HCC)    Bone  PMH: multiple myeloma, COPD, PE/DVT, hypertension, hyperlipidemia and malignant hypercalcemia   Food/Nutrition Related History:   Comments: Patient in room at time of entry with pancakes and fruits. Stated she didn't like the pancakes, likes fruits. Towards the end of assessment patient reports does not want her food asked for this RD to take it away. Patient ate 0% of breakfast. She reports good appetite at home with 2 meals a day and a protein shake premier protein, 160 calories and 30 g protein. She states sometimes doesn't feel like eating. Patient reports eating foods such as; cheese burger, chicken with sides (broccoli and cheese, carrots, peas, beans, and other items). She states she cooks sometimes and her mother also cooks/makes food for her. She had a BM right before this RD entered room, nursing cleaned her up. Also noted in nursing note 08/17/24, that she has had several loose stools this morning.Per MD note 08/17/24 plan is to correct all electrolytes and discharge tomorrow. Patient lives in [place]. Weight loss shows with 7.1% loss in 2 months.   Current Diet Order:  Dietary Orders (From admission, onward)     Start     Ordered   08/17/24 1445  Ensure Max Protein  Until discontinued       Question Answer Comment  Deliver with Lunch 1  Select supplement: Ensure Max Protein      08/17/24 1445   08/17/24 1117  Nutrition supplements  Until discontinued       Question Answer Comment  Deliver with Lunch 3  Select supplement: Ensure Plus High  Protein Chocolate      08/17/24 1117   08/16/24 1028  Adult diet Regular; Thin Liquids  Diet effective now       Question Answer Comment  Diet Consistency Regular   Fluid consistency Thin Liquids      08/16/24 1027          Last Bowel Movement: 08/17/24 Food Allergies/Intolerances: Patient has no known allergies.  Medications: MAR Summary reviewed.  acyclovir , 400 mg, Oral, BID carvedilol, 3.125 mg, Oral, BID with meals ferrous sulfate , 325 mg, Oral, Daily with breakfast loratadine, 10 mg, Oral, Daily potassium chloride  CR, 20 mEq, Oral, BID potassium phosphate  250 neutral, 250 mg, Oral, BID with meals sodium chloride , 3 mL, Intravenous, q8h SCH sodium chloride , 1,000 mL, Intravenous, Once  PRN medications: acetaminophen  **OR** acetaminophen , albuterol , [Held by provider] furosemide , hydrALAZINE , sodium chloride    Lab Data reviewed.   Latest Reference Range & Units 08/15/24 21:49 08/16/24 06:23 08/17/24 01:19  Sodium 136 - 145 mmol/L 151 (H) 152 (H) 143  Potassium 3.4 - 5.1 mmol/L 3.2 (L) 2.8 (L) 2.7 (L)  Chloride 98 - 107 mmol/L 112 (H) 112 (H) 105  CO2 20 - 31 mmol/L 30 32 (H) 31  BUN 9 - 23 mg/dL 19 20 15   Creatinine 0.55 - 1.02 mg/dL 9.24 9.24 9.31  Glucose 74 - 106 mg/dL 848 (H) 842 (H) 873 (H)  Anion Gap 5 - 15 mmol/L 9 8 7  Calcium  8.7 - 10.4 mg/dL 87.8 (H) 88.2 (H) 88.5 (H)  Albumin 3.4 - 5 g/dL 2.7 (L) 2.5 (L) 2.4 (L)  Globulin, Total 1.5 - 4.5 g/dL 4.9 (H) 4.6 (H) 4.6 (H)  ALT (SGPT) 10 - 49 U/L 13 12 13   AST 15 - 34 U/L 40 (H) 40 (H) 41 (H)  Total Bilirubin 0.2 - 1.2 mg/dL 0.3 0.3 0.3  A/G RATIO (IFEAGR)  0.6 0.5 0.5  BUN/Creatinine Ratio  25 27 22   EGFR CKD-EPI >=60 mL/min/1.56m*2 >60 >60 >60  Magnesium  1.6 - 2.6 mg/dL  8.64 (L) 8.59 (L)  Osmolality Calc mosm/kg 305 307 287  Phosphorus  2.4 - 5.1 mg/dL  1.8 (L) 1.5 (L)  (H): Data is abnormally high (L): Data is abnormally low   Edema: No data recorded Skin Integrity: Bruising Wounds:    Anthropometric Measurements: Height:  Ht Readings from Last 1 Encounters:  08/13/24 1.549 m (5' 1)  Weight:  Wt Readings from Last 1 Encounters:  08/17/24 244 lb 11.4 oz (111 kg)   Body Mass Index: Body mass index is 46.24 kg/m. Morbidly Obesity Class III >40 Ideal Body Weight (IBW):   Ideal body weight: 105 lb 6.1 oz (47.8 kg) [place] equation Usual Body Weight (UBW):  113.6 (Kg) per patient 2 months ago % Weight Change: 2.3% wt loss in 2 months, not significant  Wt history: 08/04/24 241 lb (109.3 kg)       07/29/24 249 lb 8 oz (113.2 kg)  07/19/24 253 lb (114.8 kg)  8/20/25119.5 kg--> 7.1% wt loss in ~2 months per EHR   Estimated Calorie Needs: 22-25 kcal/kg [facility] with AF 1.3: 2122 kcal (~19 kcal/kg) Estimated Protein Needs:  1.2-1.5 (g/kg) using IBW; 57.3-71.7 g PRO Estimated Fluid Needs: 1 mL/kcal or per provider   Rationale: Adjust for obesity and Increased need for cancer   Malnutrition Criteria:  Patient does not show signs of muscle/fat depletion.  Nutrition Diagnosis  Risk of inadequate intake related to lethargy and weakness as evidenced by MD note, admission for acute illness recovery and decreased oral intake in admission with weight loss over the past 2 months   Nutrition Intervention  1.Diet: continue current diet   2. Oral Nutrition Supplements:  recommend 1 ensure max high protein strawberry flavored daily If chocolate ensure available: please provide chocolate ensure in place of strawberry ensure high protein when becomes available, both orders in.  3. Micronutrients:  recommend thiamine 100 mg, po, daily   4. Monitor and replete electrolytes as needed - Mg, K+, P all low and being replaces - Ca high   5. Patient is showing signs of refeeding syndrome. Anticipate the aggressive supplementation of magnesium , phosphorus  and potassium as clinically indicated  Nutrition Monitoring and Evaluation RD to monitor PO intake, tolerance to and  adequacy of nutrition regimen, nutrition parameters, clinical course and plan of care.  Patient Goals:  Patient will have intake >50% of most meals consistently.  Status:  Initial 2.   Patient will have intake >50% of most supplements consistently.   Status: Initial 3.   Patient will have weight stabilization.  Status:  Initial 4.   Patient will have bowel regularity.  Status:  Initial 5.   Patient's nutrition-related labs will progress towards normal.  Status:  Initial (K+, Mg, P)  Next Date for Nutrition Services Follow Up: 08/24/24

## 2024-08-17 NOTE — Nursing Note (Signed)
 EOS: Patient alert and oriented x 4 with period of confusion, safety measures in place and maintained. IVF in progress. Patient had frequent loose stools on shift. PT/OT suggesting SNF, Unstable electrolytes, same is being monitored, safety measures in place and maintained care and observation continues.  IV team to replace, infiltrated IV.

## 2024-08-18 ENCOUNTER — Inpatient Hospital Stay

## 2024-08-18 ENCOUNTER — Inpatient Hospital Stay: Admitting: Hematology

## 2024-08-18 ENCOUNTER — Other Ambulatory Visit (HOSPITAL_COMMUNITY): Payer: Self-pay

## 2024-08-18 ENCOUNTER — Encounter: Payer: Self-pay | Admitting: Hematology

## 2024-08-18 DIAGNOSIS — D61818 Other pancytopenia: Secondary | ICD-10-CM | POA: Diagnosis not present

## 2024-08-18 DIAGNOSIS — E878 Other disorders of electrolyte and fluid balance, not elsewhere classified: Secondary | ICD-10-CM | POA: Diagnosis not present

## 2024-08-18 DIAGNOSIS — C9000 Multiple myeloma not having achieved remission: Secondary | ICD-10-CM | POA: Diagnosis not present

## 2024-08-18 MED ORDER — CARVEDILOL 3.125 MG PO TABS
3.1250 mg | ORAL_TABLET | Freq: Two times a day (BID) | ORAL | 0 refills | Status: DC
  Filled 2024-08-18: qty 60, 30d supply, fill #0

## 2024-08-18 MED ORDER — K-PHOS-NEUTRAL 155-852-130 MG PO TABS
250.0000 mg | ORAL_TABLET | Freq: Two times a day (BID) | ORAL | 0 refills | Status: DC
  Filled 2024-08-18: qty 14, 7d supply, fill #0

## 2024-08-18 MED ORDER — THIAMINE MONONITRATE 100 MG PO TABS
100.0000 mg | ORAL_TABLET | Freq: Every morning | ORAL | 0 refills | Status: DC
  Filled 2024-08-18: qty 30, 30d supply, fill #0

## 2024-08-18 MED ORDER — ACETAMINOPHEN 325 MG PO TABS
650.0000 mg | ORAL_TABLET | Freq: Four times a day (QID) | ORAL | 0 refills | Status: DC
  Filled 2024-08-18 – 2024-10-03 (×3): qty 30, 4d supply, fill #0

## 2024-08-18 NOTE — Progress Notes (Signed)
 Speech-Language Pathology Speech-Language Pathology Visit/Discharge Note   Patient Name: Carla Hanson MRN: [id number] Today's Date: 08/18/2024  Subjective Current Problem: Speech Therapy follow-up for diet tolerance/meal assessment (on regular consistency diet) Pain:    Objective General Visit Info: General Treatment Duration (min): 9 Minutes Family/Caregiver Present: No  Treatment: Swallow Swallow Time: 9 Swallow Activity 1: meal assessment/diet tolerance Diet Recommendations: Regular consistency          SLP consulted with RN who reports pt has demonstrated improved mentation since hospital admission and she has been tolerating a regular consistency diet without difficulty.  This day, pt was observed to eat bites of cantaloupe and sausage links with adequate mastication/bolus manipulation, complete oral clearance.  Functional oral containment/transit with suspected overall timely swallow observed with thin liquids.  No overt s/s aspiration observed with any consistency.  Pt also engaged in conversation without word retrieval difficulty, speech deficits and with appropriate mentation throughout.    Recommend pt continue with a regular consistency diet, thin liquids, pills administered whole as tolerated.  Further Speech Therapy does not appear warranted at this time. ST will sign off case.     Comments:    Care Plan Goals: Encounter Problems     Encounter Problems (Resolved)     SLP Problem     Patient will tolerate safest, least restrictive diet without overt s/sx of aspiration or decline in respiratory status. (Met)     Start:  08/16/24    Expected End:  08/23/24    Resolved:  08/18/24      Patient/caregivers will verbalize understanding of safe swallowing strategies (Met)     Start:  08/16/24    Expected End:  08/23/24    Resolved:  08/18/24          Assessment & Plan Plan Treatment/Interventions: Diet tolerance monitoring SLP Plan: No skilled SLP No Skilled  SLP: At baseline function, Independent with communication, Independent with swallowing Diet Recommendations: Regular consistency  Encounter Problems     Encounter Problems (Resolved)     SLP Problem     Patient will tolerate safest, least restrictive diet without overt s/sx of aspiration or decline in respiratory status. (Met)     Start:  08/16/24    Expected End:  08/23/24    Resolved:  08/18/24      Patient/caregivers will verbalize understanding of safe swallowing strategies (Met)     Start:  08/16/24    Expected End:  08/23/24    Resolved:  08/18/24

## 2024-08-18 NOTE — Discharge Summary (Signed)
 [facility] Hospitalist  DISCHARGE SUMMARY Epic Chat Secure Text preferred   Admit Date: 08/13/2024   Discharge date: 08/18/2024 Code Status: Full Code  Patient's PCP: No Pcp Providers to follow up with:   PCP within 1 week Oncology within 1-2 weeks  No future appointments. Referrals and Follow-ups to Schedule     Ambulatory referral to Home Health     Special Instructions: Evaluate and treat  I attest that I or another qualified licensed provider saw Varie Machamer 90 days prior to or 30 days post admission and this face to face encounter meets the necessary Home Health requirements. The face to face encounter occurred on (date) 08/18/2024.  The encounter with the patient was in whole, or in part, for the following medical condition, which is the primary reason for home health care. (List medical condition) multiple myeloma, hypercalcemia, ambulatory dysfunction  I certify that, based on my findings, the following services are medically necessary skilled home health services: Other: Visiting nurse, PT and OT.  Further, I certify that my clinical findings support this patient's homebound status (i.e. absences from home require considerable and taxing effort, are for health treatment, or for attendance at religious events; absences from home for nonmedical reasons are infrequent or are of relatively short duration).   Disciplines Requested:  Skilled Nursing Physical Therapy Occupational Therapy     Physician to follow patient's care: PCP        Hospital Course: 58 year old female with past medical history of multiple myeloma, COPD, PE/DVT, HTN, HLD, malignant hypercalcemia.  Presented to the hospital with complaints of weakness and lethargy.  On admission she was found to pancytopenia (likely secondary to MM), severe hypercalcemia with calcium  of 20, hypokalemia of 2.5.  She received IV fluids, zoledronic  acid 4 mg IV, calcitonin and now her calcium  levels are significantly improved,  calcium  level today is 11.6.  Potassium was also supplemented and now within normal range.  Hypomagnesemia was also treated, now within normal range.  Patient is alert and oriented x 4 although she still looks somewhat tired.  She is able to ambulate with walker and assistance.  She is not interested in SNF placement, she would like to go home with her mother. She will be discharged with phosphorus  supplements.  Patient is otherwise medically stable for discharge.  She will be discharged home with home health and instructions to follow-up with PCP and oncology.  She was advised to keep good hydration at home.  Was this patient treated for decompensated heart failure during this admission? No  Discharge Diagnoses: Principal Problem:   Hypercalcemia Active Problems:   Multiple myeloma   COPD (chronic obstructive pulmonary disease)   HTN (hypertension)   Pancytopenia Resolved Problems:   Hypokalemia   Discharge Medications: Home Medications After Discharge  Scheduled . acyclovir  (Zovirax ) 400 MG tablet, Take 400 mg by mouth 2 times a day. . carvedilol (Coreg) 3.125 MG tablet, Take 1 tablet by mouth in the morning and 1 tablet in the evening. Take with meals. . cetirizine  (ZyrTEC ) 10 MG tablet, Take 10 mg by mouth before bedtime. . ferrous sulfate  325 (65 Fe) MG tablet, Take 325 mg by mouth in the morning. Take with meals. . K Phos  Mono-Sod Phos Di & Mono (potassium phosphate  250 neutral) 155-852-130 MG tablet, Take 250 mg by mouth in the morning and 250 mg in the evening. Take with meals. Do all this for 7 days. . potassium chloride  CR (Klor-Con  M20) 20 MEQ ER tablet, Take 20 mEq by  mouth 2 times a day. . thiamine (Vitamin B-1) 100 MG tablet, Take 1 tablet by mouth in the morning. Start: 08/19/24  PRN . acetaminophen  (Tylenol ) 325 MG tablet, Take 2 tablets by mouth every 6 hours if needed for mild pain (1-3) for up to 5 days. . albuterol  (2.5 MG/3ML) 0.083% nebulizer solution, Take 2.5 mg  by nebulization every 6 hours if needed for wheezing or shortness of breath. . furosemide  (Lasix ) 40 MG tablet, Take 40 mg by mouth daily as needed (PRN swelling/fluid retention).  ECG                          [facility]                    [address] [place], [place] [address]                                        Test Date:    2024-08-14 [place] Name:     [facility]             Department:   Emergency Department Patient ID:   [id number]                 Room:         D14 Gender:       F                        Technician:   22 DOB:          December 02, 1965               Requested By: CHANNIE DRYER  Order Number: [id number]                 Reading MD:   Butler Elbe, M. D.                                  Measurements Intervals                              Axis           Rate:         69                       P:            60 PR:           224                      QRS:          36 QRSD:         120                      T:            47 QT:           522                                     QTc:  559                                                                Interpretive Statements Sinus rhythm with 1st degree AV block ST & T wave abnormality, consider anterior ischemia Compared to ECG 08/13/2024 21:17:16 Intraventricular conduction delay now present ST (T wave) deviation now present Possible ischemia now present Left ventricular hypertrophy no longer present Early repolarization no longer present Prolonged QT interval no longer present Electronically Signed On 08-14-2024 19:09:11 EDT by Butler Elbe, M. D. [phone].pdf    Discharge Physical Exam:   Vitals:   08/18/24 0500 08/18/24 0821 08/18/24 1139 08/18/24 1153  BP:  134/56 141/74   BP Location:  Left arm Left arm   Patient Position:  Lying Lying   Pulse:  90 78   Resp:  20 20 20   Temp:      TempSrc:      SpO2:  91% 95%   Weight: 247 lb 9.2 oz (112.3 kg)     Height:       I/O last 3  completed shifts: In: 3253.8 (29 mL/kg) [I.V.:2253.8 (20.1 mL/kg); IV Piggyback:1000] Out: 1550 (13.8 mL/kg) [Urine:550 (0.1 mL/kg/hr); Stool:1000] Weight: 112.3 kg  I/O this shift: In: 4145.5 (36.9 mL/kg) [P.O.:480; I.V.:65 (0.6 mL/kg); IV Piggyback:3600.5] Out: - (0 mL/kg)  Weight: 112.3 kg  Visit Vitals BP 141/74 (BP Location: Left arm, Patient Position: Lying)  Pulse 78  Temp 36.2 C (97.2 F) (Temporal)  Resp 20    Physical Exam GEN-laying in bed, no distress, alert HEENT-pupils equal, pale conjunctiva, oral mucosa moist Lungs-clear Cardiac-normal S1 and S2.  Regular rhythm Abdomen-soft and nontender Extremities-no edema Psych-pleasant, calm Neuro-- alert and oriented x 4    Spent 35 minutes discharging this patient from the hospital.  Aloysius Alveria Byes, MD

## 2024-08-19 ENCOUNTER — Telehealth: Payer: Self-pay

## 2024-08-19 ENCOUNTER — Other Ambulatory Visit (HOSPITAL_COMMUNITY): Payer: Self-pay

## 2024-08-19 NOTE — Transitions of Care (Post Inpatient/ED Visit) (Signed)
   08/19/2024  Name: Carla Hanson MRN: [id number] DOB: 01/05/1966  Today's TOC FU Call Status: Today's TOC FU Call Status:: Unsuccessful Call (1st Attempt) Unsuccessful Call (1st Attempt) Date: 08/19/24  Attempted to reach the patient regarding the most recent Inpatient/ED visit.  Follow Up Plan: Additional outreach attempts will be made to reach the patient to complete the Transitions of Care (Post Inpatient/ED visit) call.   Shona Prow RN, CCM [place]  VBCI-Population Health RN Care Manager [phone]

## 2024-08-20 ENCOUNTER — Telehealth: Payer: Self-pay

## 2024-08-20 NOTE — Transitions of Care (Post Inpatient/ED Visit) (Signed)
   08/20/2024  Name: Carla Hanson MRN: [id number] DOB: 07/06/1966  Today's TOC FU Call Status: Today's TOC FU Call Status:: Unsuccessful Call (2nd Attempt) Unsuccessful Call (2nd Attempt) Date: 08/20/24  Attempted to reach the patient regarding the most recent Inpatient/ED visit.  Follow Up Plan: Additional outreach attempts will be made to reach the patient to complete the Transitions of Care (Post Inpatient/ED visit) call.   Shona Prow RN, CCM [place]  VBCI-Population Health RN Care Manager [phone]

## 2024-08-23 ENCOUNTER — Encounter (HOSPITAL_COMMUNITY): Payer: Self-pay

## 2024-08-23 ENCOUNTER — Telehealth: Payer: Self-pay

## 2024-08-23 ENCOUNTER — Ambulatory Visit (HOSPITAL_COMMUNITY): Admission: RE | Admit: 2024-08-23 | Source: Ambulatory Visit

## 2024-08-23 NOTE — Transitions of Care (Post Inpatient/ED Visit) (Signed)
   08/23/2024  Name: Carla Hanson MRN: [id number] DOB: 1966-09-16  Today's TOC FU Call Status: Today's TOC FU Call Status:: Unsuccessful Call (3rd Attempt) Unsuccessful Call (3rd Attempt) Date: 08/23/24  Attempted to reach the patient regarding the most recent Inpatient/ED visit.  Follow Up Plan: No further outreach attempts will be made at this time. We have been unable to contact the patient.  Shona Prow RN, CCM [place]  VBCI-Population Health RN Care Manager [phone]

## 2024-08-25 ENCOUNTER — Telehealth: Payer: Self-pay

## 2024-08-25 ENCOUNTER — Inpatient Hospital Stay: Admitting: Hematology

## 2024-08-25 ENCOUNTER — Other Ambulatory Visit: Payer: Self-pay | Admitting: Family Medicine

## 2024-08-25 ENCOUNTER — Inpatient Hospital Stay

## 2024-08-25 ENCOUNTER — Other Ambulatory Visit (HOSPITAL_COMMUNITY): Payer: Self-pay

## 2024-08-25 ENCOUNTER — Other Ambulatory Visit: Payer: Self-pay

## 2024-08-25 VITALS — BP 145/77 | HR 97 | Temp 97.9°F | Resp 18 | Wt 230.2 lb

## 2024-08-25 DIAGNOSIS — Z86718 Personal history of other venous thrombosis and embolism: Secondary | ICD-10-CM | POA: Diagnosis not present

## 2024-08-25 DIAGNOSIS — C9000 Multiple myeloma not having achieved remission: Secondary | ICD-10-CM

## 2024-08-25 DIAGNOSIS — Z87891 Personal history of nicotine dependence: Secondary | ICD-10-CM | POA: Diagnosis not present

## 2024-08-25 DIAGNOSIS — Z86711 Personal history of pulmonary embolism: Secondary | ICD-10-CM | POA: Diagnosis not present

## 2024-08-25 LAB — CBC WITH DIFFERENTIAL (CANCER CENTER ONLY)
Abs Immature Granulocytes: 0.01 K/uL (ref 0.00–0.07)
Basophils Absolute: 0 K/uL (ref 0.0–0.1)
Basophils Relative: 0 %
Eosinophils Absolute: 0 K/uL (ref 0.0–0.5)
Eosinophils Relative: 0 %
HCT: 27.1 % — ABNORMAL LOW (ref 36.0–46.0)
Hemoglobin: 9 g/dL — ABNORMAL LOW (ref 12.0–15.0)
Immature Granulocytes: 0 %
Lymphocytes Relative: 64 %
Lymphs Abs: 1.4 K/uL (ref 0.7–4.0)
MCH: 26 pg (ref 26.0–34.0)
MCHC: 33.2 g/dL (ref 30.0–36.0)
MCV: 78.3 fL — ABNORMAL LOW (ref 80.0–100.0)
Monocytes Absolute: 0.3 K/uL (ref 0.1–1.0)
Monocytes Relative: 14 %
Neutro Abs: 0.5 K/uL — ABNORMAL LOW (ref 1.7–7.7)
Neutrophils Relative %: 22 %
Platelet Count: 49 K/uL — ABNORMAL LOW (ref 150–400)
RBC: 3.46 MIL/uL — ABNORMAL LOW (ref 3.87–5.11)
RDW: 14.2 % (ref 11.5–15.5)
Smear Review: NORMAL
WBC Count: 2.3 K/uL — ABNORMAL LOW (ref 4.0–10.5)
nRBC: 0 % (ref 0.0–0.2)

## 2024-08-25 LAB — CMP (CANCER CENTER ONLY)
ALT: 11 U/L (ref 0–44)
AST: 38 U/L (ref 15–41)
Albumin: 3.3 g/dL — ABNORMAL LOW (ref 3.5–5.0)
Alkaline Phosphatase: 120 U/L (ref 38–126)
Anion gap: 5 (ref 5–15)
BUN: 18 mg/dL (ref 6–20)
CO2: 34 mmol/L — ABNORMAL HIGH (ref 22–32)
Calcium: 11.4 mg/dL — ABNORMAL HIGH (ref 8.9–10.3)
Chloride: 98 mmol/L (ref 98–111)
Creatinine: 0.69 mg/dL (ref 0.44–1.00)
GFR, Estimated: >60 mL/min (ref >60)
Glucose, Bld: 138 mg/dL — ABNORMAL HIGH (ref 70–99)
Potassium: 2.6 mmol/L — CL (ref 3.5–5.1)
Sodium: 137 mmol/L (ref 135–145)
Total Bilirubin: 0.6 mg/dL (ref 0.0–1.2)
Total Protein: 8.2 g/dL — ABNORMAL HIGH (ref 6.5–8.1)

## 2024-08-25 MED ORDER — FUROSEMIDE 40 MG PO TABS
40.0000 mg | ORAL_TABLET | Freq: Every day | ORAL | 1 refills | Status: DC
  Filled 2024-08-25: qty 90, 90d supply, fill #0

## 2024-08-25 MED ORDER — POTASSIUM CHLORIDE CRYS ER 20 MEQ PO TBCR
40.0000 meq | EXTENDED_RELEASE_TABLET | Freq: Three times a day (TID) | ORAL | 0 refills | Status: DC
  Filled 2024-08-25: qty 60, 10d supply, fill #0
  Filled 2024-08-25: qty 60, 12d supply, fill #0

## 2024-08-25 NOTE — Progress Notes (Signed)
 HEMATOLOGY/ONCOLOGY CLINIC NOTE  Date of Service: .07/29/2024  Patient Care Team: Anders Otto DASEN, MD as PCP - General (Family Medicine) Rosemarie [name] RAMAN, MD as Consulting Physician (Neurology) Wonda Cy BROCKS, RD as Dietitian (Family Medicine) Onesimo Emaline Brink, MD as Consulting Physician (Hematology)  CHIEF COMPLAINTS/PURPOSE OF CONSULTATION:  Follow-up for continued management of multiple myeloma  HISTORY OF PRESENTING ILLNESS:  Carla Hanson is a wonderful 58 y.o. 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   Patient has a history of hypertension, dyslipidemia, COPD, history of PE/DVT, prediabetes, obesity who notes that she was working out in the gym and had significant back pain resulting in an x-ray that showed T6 compression fracture in January 2025.   She had a follow-up with healthy weight and wellness clinic on 3/27 and reported symptoms of significant weakness dizziness lightheadedness and dehydration despite drinking a lot of water.  She also noted nausea but no emesis and constipation. Patient was seen in the emergency room and noted to have severe hypercalcemia with a calcium  level of 17.8 with an albumin of 3.8, total protein was elevated at 9.1 with an increased globulin gap, potassium was 2.7. 25-hydroxy vitamin D  levels were within normal limits at 66.9. TSH was normal at 0.736. PTH levels were appropriately suppressed at 6 which is suggestive of non hyperparathyroid hypercalcemia. CBC-on admission showed normal hemoglobin of 13.1 in the context of significant hemoconcentration.  This might be hiding her baseline anemia which might declare itself once the patient's volume status is replaced.  No other cytopenias. Patient notes no acute new focal bone pains.   Patient notes no fevers no chills no night sweats. No previous history of cancers. Her last mammogram was on 02/07/2023 and  was within normal limits.  She is previously noted to have a benign lipoma in the right breast which was resected. Patient has been ex-smoker and smoked half to 1 pack a day for 35 years and quit in December 2017.   New focal symptoms. Patient has received 1 dose of Zometa  and IV fluids and calcium  levels today are down to 12 with a corrected calcium  between 13 and 14. She is receiving IV fluids potassium replacement phosphorus  replacement and calcitonin today.   Normal labs have been ordered and patient is pending whole-body skeletal survey.  24-hour urine collection for myeloma has been sent out.   INTERVAL HISTORY:  Carla Hanson is a 58 y.o. female who is here for continued evaluation and management of her multiple myeloma.  Last seen by me on 07/29/2024 and she was doing well overall. Her Velcade  was held due to tingling, restlessness, and muscle cramps in LEs.  Today, she was recently in the hospital for Hypercalcemia. She has subsequently been placed on Vitamin-B1. Does endorse feeling weak today, but denies general bone pains. She says that she did fall while feeling fatigued.  She is eating/hydrating well. Continues to take Lasix , but is no longer taking Carvedilol. Is taking Phosphorus .    MEDICAL HISTORY:  Past Medical History:  Diagnosis Date   Alcohol abuse    Anxiety    Back pain    Bipolar disorder (HCC)    Bronchitis    COPD exacerbation (HCC) 05/10/2016   Drug use    Edema 02/24/2024   History of blood clots    Hyperlipidemia    Hypertension    Influenza A 12/01/2018   Joint  pain    Left leg DVT (HCC) 09/29/2013   Lipoma    Abdomen   LIPOMA 01/20/2008   Obesity    Occasional tremors 05/12/2019   Osteoarthritis of right knee    Other fatigue    Painful menstrual periods 01/05/2012   Pneumonia    Prediabetes    Seizure (HCC)    Shortness of breath on exertion    Stroke [facility])    Tobacco abuse     SURGICAL HISTORY: Past Surgical History:  Procedure  Laterality Date   CESAREAN SECTION     CYSTECTOMY     LIPOMA EXCISION  03/2011   ORIF ANKLE FRACTURE  03/13/2012   Procedure: OPEN REDUCTION INTERNAL FIXATION (ORIF) ANKLE FRACTURE;  Surgeon: Lynwood FORBES Better, MD;  Location: [facility];  Service: Orthopedics;  Laterality: Right;   TUBAL LIGATION      SOCIAL HISTORY: Social History   Socioeconomic History   Marital status: Divorced    Spouse name: Not on file   Number of children: 4   Years of education: 10   Highest education level: Not on file  Occupational History   Occupation: stay at home  Tobacco Use   Smoking status: Former    Current packs/day: 0.00    Average packs/day: 0.5 packs/day for 35.0 years (17.5 ttl pk-yrs)    Types: Cigarettes    Start date: 11/04/1981    Quit date: 10/2016    Years since quitting: 7.8   Smokeless tobacco: Never  Vaping Use   Vaping status: Never Used  Substance and Sexual Activity   Alcohol use: No    Alcohol/week: 0.0 standard drinks of alcohol    Comment: Quit 10/2016   Drug use: Yes    Types: Marijuana   Sexual activity: Not Currently    Comment: tubal  Other Topics Concern   Not on file  Social History Narrative   Unemployed, 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  Considering nursing.    Lives at home alone   Right-handed   Caffeine: not much   Social Drivers of Corporate Investment Banker Strain: Low Risk  (05/14/2024)   Overall Financial Resource Strain (CARDIA)    Difficulty of Paying Living Expenses: Not hard at all  Food Insecurity: No Food Insecurity (05/14/2024)   Hunger Vital Sign    Worried About Running Out of Food in the Last Year: Never true    Ran Out of Food in the Last Year: Never true  Transportation Needs: No Transportation Needs (08/14/2024)   Received from [facility]   PRAPARE - Transportation    Lack of Transportation (Medical): No    Lack of Transportation  (Non-Medical): No  Physical Activity: Insufficiently Active (05/14/2024)   Exercise Vital Sign    Days of Exercise per Week: 3 days    Minutes of Exercise per Session: 20 min  Stress: No Stress Concern Present (05/14/2024)   [employer] of Occupational Health - Occupational Stress Questionnaire    Feeling of Stress: Not at all  Social Connections: Moderately Integrated (05/14/2024)   Social Connection and Isolation Panel    Frequency of Communication with Friends and Family: More than three times a week    Frequency of Social Gatherings with Friends and Family: More than three times a week    Attends Religious Services: More than 4 times per year    Active Member of [employer] or Organizations: Yes  Attends Banker Meetings: 1 to 4 times per year    Marital Status: Divorced  Intimate Partner Violence: Not At Risk (02/06/2024)   Humiliation, Afraid, Rape, and Kick questionnaire    Fear of Current or Ex-Partner: No    Emotionally Abused: No    Physically Abused: No    Sexually Abused: No    FAMILY HISTORY: Family History  Problem Relation Age of Onset   Other Mother        High blood pressure runs in the family   Obesity Mother    Asthma Father    Heart attack Father    Obesity Father    Diabetes Maternal Aunt    Breast cancer Neg Hx     ALLERGIES:  is allergic to latuda  [lurasidone ].  MEDICATIONS:  Current Outpatient Medications  Medication Sig Dispense Refill   acetaminophen  (TYLENOL ) 325 MG tablet Take 650 mg by mouth every 6 (six) hours as needed for mild pain (pain score 1-3).     acetaminophen  (TYLENOL ) 325 MG tablet Take 2 tablets (650 mg total) by mouth every 6 (six) hours  if needed for mild pain (1-3) for up to 5 days. 30 tablet 0   acyclovir  (ZOVIRAX ) 400 MG tablet Take 1 tablet (400 mg total) by mouth 2 (two) times daily. 60 tablet 5   albuterol  (PROVENTIL ) (2.5 MG/3ML) 0.083% nebulizer solution Take 3 mLs (2.5 mg total) by nebulization every 6 (six)  hours as needed for wheezing or shortness of breath. 75 mL 2   albuterol  (VENTOLIN  HFA) 108 (90 Base) MCG/ACT inhaler Inhale 2 puffs into the lungs every 6 (six) hours as needed for wheezing or shortness of breath. 18 g 2   aspirin  EC 81 MG EC tablet Take 1 tablet (81 mg total) by mouth daily. 30 tablet 12   carvedilol (COREG) 3.125 MG tablet Take 1 tablet (3.125 mg total) by mouth in the morning and evening with meals. 60 tablet 0   cetirizine  (ZYRTEC ) 10 MG tablet Take 1 tablet (10 mg total) by mouth daily. 90 tablet 2   ferrous sulfate  325 (65 FE) MG tablet Take 325 mg by mouth daily with breakfast.     furosemide  (LASIX ) 40 MG tablet Take 1 tablet (40 mg total) by mouth daily. 90 tablet 1   K Phos  Mono-Sod Phos Di & Mono (K-PHOS-NEUTRAL) 155-852-130 MG TABS Take 1 tablet by mouth in the morning and evening with meals for 7 days. 14 tablet 0   losartan  (COZAAR ) 50 MG tablet Take 1 tablet (50 mg total) by mouth daily. 90 tablet 0   ondansetron  (ZOFRAN ) 8 MG tablet Take 1 tablet (8 mg total) by mouth every 8 (eight) hours as needed for nausea or vomiting. 30 tablet 1   potassium chloride  SA (KLOR-CON  M) 20 MEQ tablet Take 1 tablet (20 mEq total) by mouth daily. 30 tablet 2   prochlorperazine  (COMPAZINE ) 10 MG tablet Take 1 tablet (10 mg total) by mouth every 6 (six) hours as needed for nausea or vomiting 30 tablet 1   rosuvastatin  (CRESTOR ) 20 MG tablet Take 1 tablet (20 mg total) by mouth daily. 90 tablet 1   thiamine (VITAMIN B1) 100 MG tablet Take 1 tablet (100 mg total) by mouth every morning. 30 tablet 0   tiZANidine  (ZANAFLEX ) 4 MG tablet Take 1 tablet (4 mg total) by mouth 2 (two) times daily as needed for muscle spasms. 60 tablet 3   No current facility-administered medications for this visit.  REVIEW OF SYSTEMS:   10 Point review of Systems was done is negative except as noted above.  PHYSICAL EXAMINATION: ECOG PERFORMANCE STATUS: 2 .BP (!) 145/77 Comment: re-check  Pulse 97    Temp 97.9 F (36.6 C)   Resp 18   Wt 230 lb 3.2 oz (104.4 kg)   LMP  (LMP Unknown)   SpO2 99%   BMI 42.79 kg/m  . Wt Readings from Last 3 Encounters:  08/25/24 230 lb 3.2 oz (104.4 kg)  08/04/24 241 lb (109.3 kg)  07/29/24 249 lb 8 oz (113.2 kg)  . NAD GENERAL:alert, in no acute distress and comfortable SKIN: no acute rashes, no significant lesions EYES: conjunctiva are pink and non-injected, sclera anicteric OROPHARYNX: MMM, no exudates, no oropharyngeal erythema or ulceration NECK: supple, no JVD LYMPH:  no palpable lymphadenopathy in the cervical, axillary or inguinal regions LUNGS: clear to auscultation b/l with normal respiratory effort HEART: regular rate & rhythm ABDOMEN:  normoactive bowel sounds , non tender, not distended. Extremity: no pedal edema PSYCH: alert & oriented x 3 with fluent speech NEURO: no focal motor/sensory deficits (+) pain on palpation to spine.    LABORATORY DATA:  I have reviewed the data as listed .    Latest Ref Rng & Units 08/27/2024   11:00 AM 08/25/2024    2:10 PM 07/29/2024    9:24 AM  CBC  WBC 4.0 - 10.5 K/uL 2.0  2.3  9.1   Hemoglobin 12.0 - 15.0 g/dL 9.2  9.0  87.4   Hematocrit 36.0 - 46.0 % 27.4  27.1  37.7   Platelets 150 - 400 K/uL 36  49  166       CBC 10/10 - 10/14   CMP 10/12 - 10/14     Latest Ref Rng & Units 08/25/2024    2:10 PM 07/29/2024    9:24 AM 07/20/2024    1:54 PM  CMP  Glucose 70 - 99 mg/dL 861  881  94   BUN 6 - 20 mg/dL 18  10  8    Creatinine 0.44 - 1.00 mg/dL 9.30  9.31  9.36   Sodium 135 - 145 mmol/L 137  140  141   Potassium 3.5 - 5.1 mmol/L 2.6  3.2  3.2   Chloride 98 - 111 mmol/L 98  102  105   CO2 22 - 32 mmol/L 34  32  30   Calcium  8.9 - 10.3 mg/dL 88.5  89.0  9.8   Total Protein 6.5 - 8.1 g/dL 8.2  8.1  7.2   Total Bilirubin 0.0 - 1.2 mg/dL 0.6  0.5  0.5   Alkaline Phos 38 - 126 U/L 120  57  47   AST 15 - 41 U/L 38  46  24   ALT 0 - 44 U/L 11  12  15     MULTIPLE MYELOMA &  KAPPA/LAMBDA LIGHT CHAINS 04/2024 - 08/2024    02/05/2024 Bone Marrow Biopsy:       RADIOGRAPHIC STUDIES: I have personally reviewed the radiological images as listed and agreed with the findings in the report. No results found.   ASSESSMENT & PLAN:   58 year old female with severe non-PTH hypercalcemia with an admission calcium  level of 17.8    1) IgG lambda multiple myeloma Myeloma FISH panel with no notable mutations Status post severely elevated calcium  level 17.8 on admission now resolved  #2 hx dehydration and volume contraction due to hypercalcemia.   #3  Hypertension #4 dyslipidemia #5  COPD #6 prediabetes #7 multiple osseous lesions concerning for multiple myeloma.  Also noted to have left femoral intertrochanteric lesion. #8 history of T6 compression fracture was likely related to multiple myeloma.  No significant discomfort at this time. #10 h/o VTE in the past.and SAH with anticoagulation-- so cannot tolerate anticoagulation or ASA to be able to consider Revlimid  PLAN: - Discussed lab results on 08/25/2024 in detail with patient: CBC showed WBC of 2.3K increased from 2.2K, Hemoglobin of 9.0 increased from 8.7, and PLTs of 49K decreased from 64K. CMP with Potassium 2.6 decreased from 3.6 and Calcium  11.4 decreased from 11.6.  M protein 1.5, increased from 1.3 Kappa/Lambda Lights Chains stable  - Responded well initially, however since reducing Velcade  due to symptoms of tingling, restlessness, and muscle cramps in LEs her M-protein began to increase. Dicussed alternative treatments (Carfilzomib, Revlimid, Dexamethasone ), informing her that with these other options she may have to be placed on a blood thinner as well due to risk of clots. She does have a hx of a brain bleed attributed to blood thinner, which she was on to dissolve clots and is unsure if she was told to not ever be on blood thinners again.  Suggested referral to [facility] for BiTE therapy  consideration but she wants to hold off on this currently and prefers to consider treatments that can be done here. -PET/CT -- patient to call to reschedule her PET/CT - Hold Lasix  until Potassium normalizes  Recommended increasing dietary potassium intake Increased potassiunm to 40mcg  x3 daily   - switch to Carfilzamib, Revlimid  - FOLLOW-UP: PET/CT ASAP Start Carfilzomib/Cytoxan/DEx ASAP  Zometa  every 4 weeks Rpt labs on Friday 08/27/2024 PET/CT -- patient to call to reschedule her PET/CT   .The total time spent in the appointment was 30 minutes* .  All of the patient's questions were answered with apparent satisfaction. The patient knows to call the clinic with any problems, questions or concerns.   Emaline Saran MD MS AAHIVMS [facility] Hematology/Oncology Physician [facility]  .*Total Encounter Time as defined by the Centers for Medicare and Medicaid Services includes, in addition to the face-to-face time of a patient visit (documented in the note above) non-face-to-face time: obtaining and reviewing outside history, ordering and reviewing medications, tests or procedures, care coordination (communications with other health care professionals or caregivers) and documentation in the medical record.  I,  Damien Lagle,acting as a scribe for Emaline Saran, MD.,have documented all relevant documentation on the behalf of Emaline Saran, MD,as directed by  Emaline Saran, MD while in the presence of Emaline Saran, MD.  I have reviewed the above documentation for accuracy and completeness, and I agree with the above. Emaline Candida Saran MD.

## 2024-08-25 NOTE — Telephone Encounter (Signed)
 CRITICAL VALUE STICKER  CRITICAL VALUE:  Potassium 2.6  RECEIVER (on-site recipient of call):Dawit Tankard, LPN  DATE & TIME NOTIFIED: 08/25/24  03:28  MESSENGER (representative from lab): Heather  MD NOTIFIED:G.Kale, MD   TIME OF NOTIFICATION:03:28  RESPONSE:

## 2024-08-26 ENCOUNTER — Other Ambulatory Visit: Payer: Self-pay

## 2024-08-26 DIAGNOSIS — C9000 Multiple myeloma not having achieved remission: Secondary | ICD-10-CM

## 2024-08-26 LAB — KAPPA/LAMBDA LIGHT CHAINS
Kappa free light chain: 3.2 mg/L — ABNORMAL LOW (ref 3.3–19.4)
Kappa, lambda light chain ratio: 0.84 (ref 0.26–1.65)
Lambda free light chains: 3.8 mg/L — ABNORMAL LOW (ref 5.7–26.3)

## 2024-08-27 ENCOUNTER — Inpatient Hospital Stay

## 2024-08-27 DIAGNOSIS — C9000 Multiple myeloma not having achieved remission: Secondary | ICD-10-CM | POA: Diagnosis not present

## 2024-08-27 LAB — CMP (CANCER CENTER ONLY)
ALT: 32 U/L (ref 0–44)
AST: 878 U/L (ref 15–41)
Albumin: 3.4 g/dL — ABNORMAL LOW (ref 3.5–5.0)
Alkaline Phosphatase: 420 U/L — ABNORMAL HIGH (ref 38–126)
Anion gap: 7 (ref 5–15)
BUN: 19 mg/dL (ref 6–20)
CO2: 28 mmol/L (ref 22–32)
Calcium: 10.7 mg/dL — ABNORMAL HIGH (ref 8.9–10.3)
Chloride: 102 mmol/L (ref 98–111)
Creatinine: 0.75 mg/dL (ref 0.44–1.00)
GFR, Estimated: >60 mL/min (ref >60)
Glucose, Bld: 148 mg/dL — ABNORMAL HIGH (ref 70–99)
Potassium: 5.1 mmol/L (ref 3.5–5.1)
Sodium: 137 mmol/L (ref 135–145)
Total Bilirubin: 0.7 mg/dL (ref 0.0–1.2)
Total Protein: 8.4 g/dL — ABNORMAL HIGH (ref 6.5–8.1)

## 2024-08-27 LAB — CBC WITH DIFFERENTIAL (CANCER CENTER ONLY)
Abs Immature Granulocytes: 0.11 K/uL — ABNORMAL HIGH (ref 0.00–0.07)
Basophils Absolute: 0 K/uL (ref 0.0–0.1)
Basophils Relative: 0 %
Eosinophils Absolute: 0 K/uL (ref 0.0–0.5)
Eosinophils Relative: 0 %
HCT: 27.4 % — ABNORMAL LOW (ref 36.0–46.0)
Hemoglobin: 9.2 g/dL — ABNORMAL LOW (ref 12.0–15.0)
Immature Granulocytes: 5 %
Lymphocytes Relative: 68 %
Lymphs Abs: 1.4 K/uL (ref 0.7–4.0)
MCH: 26.8 pg (ref 26.0–34.0)
MCHC: 33.6 g/dL (ref 30.0–36.0)
MCV: 79.9 fL — ABNORMAL LOW (ref 80.0–100.0)
Monocytes Absolute: 0.3 K/uL (ref 0.1–1.0)
Monocytes Relative: 14 %
Neutro Abs: 0.3 K/uL — CL (ref 1.7–7.7)
Neutrophils Relative %: 13 %
Platelet Count: 36 K/uL — ABNORMAL LOW (ref 150–400)
RBC: 3.43 MIL/uL — ABNORMAL LOW (ref 3.87–5.11)
RDW: 14.4 % (ref 11.5–15.5)
WBC Count: 2 K/uL — ABNORMAL LOW (ref 4.0–10.5)
nRBC: 0 % (ref 0.0–0.2)

## 2024-08-30 LAB — MULTIPLE MYELOMA PANEL, SERUM
Albumin SerPl Elph-Mcnc: 2.4 g/dL — ABNORMAL LOW (ref 2.9–4.4)
Albumin/Glob SerPl: 0.6 — ABNORMAL LOW (ref 0.7–1.7)
Alpha 1: 0.6 g/dL — ABNORMAL HIGH (ref 0.0–0.4)
Alpha2 Glob SerPl Elph-Mcnc: 1.3 g/dL — ABNORMAL HIGH (ref 0.4–1.0)
B-Globulin SerPl Elph-Mcnc: 1.1 g/dL (ref 0.7–1.3)
Gamma Glob SerPl Elph-Mcnc: 1.5 g/dL (ref 0.4–1.8)
Globulin, Total: 4.5 g/dL — ABNORMAL HIGH (ref 2.2–3.9)
IgA: 5 mg/dL — ABNORMAL LOW (ref 87–352)
IgG (Immunoglobin G), Serum: 2093 mg/dL — ABNORMAL HIGH (ref 586–1602)
IgM (Immunoglobulin M), Srm: <5 mg/dL — ABNORMAL LOW (ref 26–217)
M Protein SerPl Elph-Mcnc: 1.5 g/dL — ABNORMAL HIGH
Total Protein ELP: 6.9 g/dL (ref 6.0–8.5)

## 2024-08-31 ENCOUNTER — Other Ambulatory Visit: Payer: Self-pay | Admitting: Hematology

## 2024-08-31 ENCOUNTER — Encounter: Payer: Self-pay | Admitting: Hematology

## 2024-08-31 DIAGNOSIS — C9000 Multiple myeloma not having achieved remission: Secondary | ICD-10-CM

## 2024-08-31 NOTE — Progress Notes (Signed)
 KCD orders placed after talking with patient

## 2024-08-31 NOTE — Progress Notes (Signed)
 DISCONTINUE ON PATHWAY REGIMEN - Multiple Myeloma and Other Plasma Cell Dyscrasias   DaraVRd (Daratumumab  SUBQ + Bortezomib  SUBQ D1,4,8,11 + Lenalidomide PO D1-14 + Dexamethasone  20 mg IV/PO D1,2,8,9,15,16) q21 Days (Induction Schema):   A cycle is every 21 days:     Lenalidomide      Dexamethasone       Bortezomib       Daratumumab  and hyaluronidase -fihj    DaraVRd (Daratumumab  SUBQ + Bortezomib  SUBQ D1,4,8,11 + Lenalidomide PO D1-14 + Dexamethasone  20 mg IV/PO D1,2,8,9,15,16) q21 Days (Consolidation Schema):   A cycle is every 21 days:     Lenalidomide      Dexamethasone       Bortezomib       Daratumumab  and hyaluronidase -fihj   **Always confirm dose/schedule in your pharmacy ordering system**  PRIOR TREATMENT: [id number]: DaraVRd - Subcutaneous Daratumumab  (Daratumumab /hyaluronidase  SUBQ + Bortezomib  1.3 mg/m2 SUBQ D1, 4, 8, 11 + Lenalidomide 25 mg PO + Dexamethasone  20 mg PO/IV) q21 Days Concurrent with Referral to Transplant Service  START OFF PATHWAY REGIMEN - Multiple Myeloma and Other Plasma Cell Dyscrasias   [id number]:KCd (Carfilzomib 20/36 mg/m2 IV + Cyclophosphamide 500 mg PO + Dexamethasone  40 mg PO) q28 Days x 6 Cycles:   Cycle 1: A cycle is 28 days:     Cyclophosphamide      Dexamethasone       Carfilzomib      Carfilzomib    Cycles 2 through 6: A cycle is every 28 days:     Cyclophosphamide      Dexamethasone       Carfilzomib   **Always confirm dose/schedule in your pharmacy ordering system**  Patient Characteristics: Multiple Myeloma, Relapsed / Refractory, Second through Fourth Lines of Therapy, Candidate for CAR T-cell Therapy Disease Classification: Multiple Myeloma Therapeutic Status: Relapsed R2-ISS Staging: Awaiting Test Results Line of Therapy: Second Line Intent of Therapy: Non-Curative / Palliative Intent, Discussed with Patient

## 2024-09-01 ENCOUNTER — Encounter (INDEPENDENT_AMBULATORY_CARE_PROVIDER_SITE_OTHER): Payer: Self-pay

## 2024-09-01 ENCOUNTER — Other Ambulatory Visit: Payer: Self-pay

## 2024-09-01 ENCOUNTER — Encounter (INDEPENDENT_AMBULATORY_CARE_PROVIDER_SITE_OTHER): Admitting: Family Medicine

## 2024-09-01 ENCOUNTER — Telehealth: Payer: Self-pay | Admitting: Hematology

## 2024-09-01 DIAGNOSIS — C9000 Multiple myeloma not having achieved remission: Secondary | ICD-10-CM

## 2024-09-03 ENCOUNTER — Other Ambulatory Visit: Payer: Self-pay

## 2024-09-07 ENCOUNTER — Inpatient Hospital Stay

## 2024-09-07 MED FILL — Dexamethasone Sodium Phosphate Inj 100 MG/10ML: INTRAMUSCULAR | Qty: 2 | Status: AC

## 2024-09-08 ENCOUNTER — Inpatient Hospital Stay

## 2024-09-08 ENCOUNTER — Other Ambulatory Visit: Payer: Self-pay | Admitting: Hematology and Oncology

## 2024-09-08 ENCOUNTER — Encounter (HOSPITAL_COMMUNITY): Payer: Self-pay

## 2024-09-08 ENCOUNTER — Other Ambulatory Visit: Payer: Self-pay

## 2024-09-08 ENCOUNTER — Inpatient Hospital Stay: Attending: Hematology

## 2024-09-08 ENCOUNTER — Encounter: Payer: Self-pay | Admitting: Hematology

## 2024-09-08 ENCOUNTER — Emergency Department (HOSPITAL_COMMUNITY)

## 2024-09-08 ENCOUNTER — Inpatient Hospital Stay (HOSPITAL_COMMUNITY)
Admission: EM | Admit: 2024-09-08 | Discharge: 2024-09-12 | DRG: 809 | Disposition: A | Discharge status: Home Health | Source: Ambulatory Visit | Attending: Internal Medicine | Admitting: Internal Medicine

## 2024-09-08 VITALS — BP 141/69 | HR 107 | Temp 99.8°F | Resp 20

## 2024-09-08 DIAGNOSIS — S2243XD Multiple fractures of ribs, bilateral, subsequent encounter for fracture with routine healing: Secondary | ICD-10-CM | POA: Diagnosis not present

## 2024-09-08 DIAGNOSIS — T451X5A Adverse effect of antineoplastic and immunosuppressive drugs, initial encounter: Secondary | ICD-10-CM | POA: Diagnosis present

## 2024-09-08 DIAGNOSIS — Z833 Family history of diabetes mellitus: Secondary | ICD-10-CM

## 2024-09-08 DIAGNOSIS — D61818 Other pancytopenia: Secondary | ICD-10-CM | POA: Insufficient documentation

## 2024-09-08 DIAGNOSIS — C9000 Multiple myeloma not having achieved remission: Secondary | ICD-10-CM

## 2024-09-08 DIAGNOSIS — D61810 Antineoplastic chemotherapy induced pancytopenia: Principal | ICD-10-CM | POA: Diagnosis present

## 2024-09-08 DIAGNOSIS — E66813 Obesity, class 3: Secondary | ICD-10-CM | POA: Diagnosis present

## 2024-09-08 DIAGNOSIS — Z87898 Personal history of other specified conditions: Secondary | ICD-10-CM

## 2024-09-08 DIAGNOSIS — D701 Agranulocytosis secondary to cancer chemotherapy: Secondary | ICD-10-CM

## 2024-09-08 DIAGNOSIS — D649 Anemia, unspecified: Secondary | ICD-10-CM | POA: Insufficient documentation

## 2024-09-08 DIAGNOSIS — R7303 Prediabetes: Secondary | ICD-10-CM | POA: Insufficient documentation

## 2024-09-08 DIAGNOSIS — E782 Mixed hyperlipidemia: Secondary | ICD-10-CM | POA: Diagnosis not present

## 2024-09-08 DIAGNOSIS — Z86718 Personal history of other venous thrombosis and embolism: Secondary | ICD-10-CM

## 2024-09-08 DIAGNOSIS — I10 Essential (primary) hypertension: Secondary | ICD-10-CM | POA: Diagnosis present

## 2024-09-08 DIAGNOSIS — Z7982 Long term (current) use of aspirin: Secondary | ICD-10-CM

## 2024-09-08 DIAGNOSIS — Z6841 Body Mass Index (BMI) 40.0 and over, adult: Secondary | ICD-10-CM

## 2024-09-08 DIAGNOSIS — Z5111 Encounter for antineoplastic chemotherapy: Secondary | ICD-10-CM | POA: Insufficient documentation

## 2024-09-08 DIAGNOSIS — E876 Hypokalemia: Secondary | ICD-10-CM | POA: Diagnosis present

## 2024-09-08 DIAGNOSIS — G40909 Epilepsy, unspecified, not intractable, without status epilepticus: Secondary | ICD-10-CM | POA: Diagnosis present

## 2024-09-08 DIAGNOSIS — Z1152 Encounter for screening for COVID-19: Secondary | ICD-10-CM

## 2024-09-08 DIAGNOSIS — Z79899 Other long term (current) drug therapy: Secondary | ICD-10-CM

## 2024-09-08 DIAGNOSIS — Z87891 Personal history of nicotine dependence: Secondary | ICD-10-CM

## 2024-09-08 DIAGNOSIS — J449 Chronic obstructive pulmonary disease, unspecified: Secondary | ICD-10-CM | POA: Diagnosis present

## 2024-09-08 DIAGNOSIS — E785 Hyperlipidemia, unspecified: Secondary | ICD-10-CM | POA: Insufficient documentation

## 2024-09-08 DIAGNOSIS — Z8249 Family history of ischemic heart disease and other diseases of the circulatory system: Secondary | ICD-10-CM

## 2024-09-08 DIAGNOSIS — E538 Deficiency of other specified B group vitamins: Secondary | ICD-10-CM | POA: Insufficient documentation

## 2024-09-08 DIAGNOSIS — C9002 Multiple myeloma in relapse: Secondary | ICD-10-CM | POA: Insufficient documentation

## 2024-09-08 DIAGNOSIS — R000 Tachycardia, unspecified: Secondary | ICD-10-CM | POA: Diagnosis not present

## 2024-09-08 DIAGNOSIS — D509 Iron deficiency anemia, unspecified: Secondary | ICD-10-CM | POA: Diagnosis present

## 2024-09-08 DIAGNOSIS — Z8673 Personal history of transient ischemic attack (TIA), and cerebral infarction without residual deficits: Secondary | ICD-10-CM

## 2024-09-08 DIAGNOSIS — R6510 Systemic inflammatory response syndrome (SIRS) of non-infectious origin without acute organ dysfunction: Secondary | ICD-10-CM | POA: Diagnosis present

## 2024-09-08 DIAGNOSIS — Z825 Family history of asthma and other chronic lower respiratory diseases: Secondary | ICD-10-CM

## 2024-09-08 DIAGNOSIS — R5381 Other malaise: Secondary | ICD-10-CM

## 2024-09-08 DIAGNOSIS — D709 Neutropenia, unspecified: Secondary | ICD-10-CM | POA: Diagnosis not present

## 2024-09-08 DIAGNOSIS — R5081 Fever presenting with conditions classified elsewhere: Secondary | ICD-10-CM | POA: Diagnosis present

## 2024-09-08 LAB — CBC WITH DIFFERENTIAL (CANCER CENTER ONLY)
Abs Immature Granulocytes: 0.11 K/uL — ABNORMAL HIGH (ref 0.00–0.07)
Basophils Absolute: 0 K/uL (ref 0.0–0.1)
Basophils Relative: 0 %
Eosinophils Absolute: 0 K/uL (ref 0.0–0.5)
Eosinophils Relative: 0 %
HCT: 20.6 % — ABNORMAL LOW (ref 36.0–46.0)
Hemoglobin: 6.9 g/dL — CL (ref 12.0–15.0)
Immature Granulocytes: 3 %
Lymphocytes Relative: 69 %
Lymphs Abs: 2.6 K/uL (ref 0.7–4.0)
MCH: 26.5 pg (ref 26.0–34.0)
MCHC: 33.5 g/dL (ref 30.0–36.0)
MCV: 79.2 fL — ABNORMAL LOW (ref 80.0–100.0)
Monocytes Absolute: 0.7 K/uL (ref 0.1–1.0)
Monocytes Relative: 18 %
Neutro Abs: 0.4 K/uL — CL (ref 1.7–7.7)
Neutrophils Relative %: 10 %
Platelet Count: 55 K/uL — ABNORMAL LOW (ref 150–400)
RBC: 2.6 MIL/uL — ABNORMAL LOW (ref 3.87–5.11)
RDW: 13.8 % (ref 11.5–15.5)
WBC Count: 3.7 K/uL — ABNORMAL LOW (ref 4.0–10.5)
nRBC: 0 % (ref 0.0–0.2)

## 2024-09-08 LAB — CBC WITH DIFFERENTIAL/PLATELET
Abs Immature Granulocytes: 0.03 K/uL (ref 0.00–0.07)
Basophils Absolute: 0 K/uL (ref 0.0–0.1)
Basophils Relative: 0 %
Eosinophils Absolute: 0 K/uL (ref 0.0–0.5)
Eosinophils Relative: 0 %
HCT: 16.8 % — ABNORMAL LOW (ref 36.0–46.0)
Hemoglobin: 5.2 g/dL — CL (ref 12.0–15.0)
Immature Granulocytes: 1 %
Lymphocytes Relative: 62 %
Lymphs Abs: 1.8 K/uL (ref 0.7–4.0)
MCH: 25.7 pg — ABNORMAL LOW (ref 26.0–34.0)
MCHC: 31 g/dL (ref 30.0–36.0)
MCV: 83.2 fL (ref 80.0–100.0)
Monocytes Absolute: 0.6 K/uL (ref 0.1–1.0)
Monocytes Relative: 22 %
Neutro Abs: 0.4 K/uL — CL (ref 1.7–7.7)
Neutrophils Relative %: 15 %
Platelets: 46 K/uL — ABNORMAL LOW (ref 150–400)
RBC: 2.02 MIL/uL — ABNORMAL LOW (ref 3.87–5.11)
RDW: 13.8 % (ref 11.5–15.5)
WBC: 2.9 K/uL — ABNORMAL LOW (ref 4.0–10.5)
nRBC: 0 % (ref 0.0–0.2)

## 2024-09-08 LAB — URINALYSIS, W/ REFLEX TO CULTURE (INFECTION SUSPECTED)
Bilirubin Urine: NEGATIVE
Glucose, UA: NEGATIVE mg/dL
Hgb urine dipstick: NEGATIVE
Ketones, ur: NEGATIVE mg/dL
Leukocytes,Ua: NEGATIVE
Nitrite: NEGATIVE
Protein, ur: 30 mg/dL — AB
Specific Gravity, Urine: 1.02 (ref 1.005–1.030)
pH: 5 (ref 5.0–8.0)

## 2024-09-08 LAB — CMP (CANCER CENTER ONLY)
ALT: 14 U/L (ref 0–44)
AST: 68 U/L — ABNORMAL HIGH (ref 15–41)
Albumin: 3.4 g/dL — ABNORMAL LOW (ref 3.5–5.0)
Alkaline Phosphatase: 187 U/L — ABNORMAL HIGH (ref 38–126)
Anion gap: 12 (ref 5–15)
BUN: 17 mg/dL (ref 6–20)
CO2: 26 mmol/L (ref 22–32)
Calcium: 11.7 mg/dL — ABNORMAL HIGH (ref 8.9–10.3)
Chloride: 98 mmol/L (ref 98–111)
Creatinine: 0.58 mg/dL (ref 0.44–1.00)
GFR, Estimated: >60 mL/min (ref >60)
Glucose, Bld: 131 mg/dL — ABNORMAL HIGH (ref 70–99)
Potassium: 3.2 mmol/L — ABNORMAL LOW (ref 3.5–5.1)
Sodium: 136 mmol/L (ref 135–145)
Total Bilirubin: 0.4 mg/dL (ref 0.0–1.2)
Total Protein: 8 g/dL (ref 6.5–8.1)

## 2024-09-08 LAB — RESP PANEL BY RT-PCR (RSV, FLU A&B, COVID)  RVPGX2
Influenza A by PCR: NEGATIVE
Influenza B by PCR: NEGATIVE
Resp Syncytial Virus by PCR: NEGATIVE
SARS Coronavirus 2 by RT PCR: NEGATIVE

## 2024-09-08 LAB — PROTIME-INR
INR: 1.1 (ref 0.8–1.2)
Prothrombin Time: 14.9 s (ref 11.4–15.2)

## 2024-09-08 LAB — PREPARE RBC (CROSSMATCH)

## 2024-09-08 LAB — I-STAT CG4 LACTIC ACID, ED: Lactic Acid, Venous: 1.5 mmol/L (ref 0.5–1.9)

## 2024-09-08 MED ORDER — SORBITOL 70 % SOLN: 30.0000 mL | Freq: Every day | Status: DC | PRN

## 2024-09-08 MED ORDER — PANTOPRAZOLE SODIUM 40 MG PO TBEC
40.0000 mg | DELAYED_RELEASE_TABLET | Freq: Every day | ORAL | Status: DC
  Administered 2024-09-09 – 2024-09-12 (×4): 40 mg via ORAL
  Filled 2024-09-08 (×4): qty 1

## 2024-09-08 MED ORDER — ACETAMINOPHEN 650 MG RE SUPP: 650.0000 mg | Freq: Four times a day (QID) | RECTAL | Status: DC | PRN

## 2024-09-08 MED ORDER — LORATADINE 10 MG PO TABS
10.0000 mg | ORAL_TABLET | Freq: Every day | ORAL | Status: DC
  Administered 2024-09-08 – 2024-09-12 (×5): 10 mg via ORAL
  Filled 2024-09-08 (×5): qty 1

## 2024-09-08 MED ORDER — HYDRALAZINE HCL 20 MG/ML IJ SOLN: 5.0000 mg | Freq: Four times a day (QID) | INTRAMUSCULAR | Status: DC | PRN

## 2024-09-08 MED ORDER — ASPIRIN 81 MG PO TBEC
81.0000 mg | DELAYED_RELEASE_TABLET | Freq: Every day | ORAL | Status: DC
  Administered 2024-09-09 – 2024-09-12 (×4): 81 mg via ORAL
  Filled 2024-09-08 (×4): qty 1

## 2024-09-08 MED ORDER — FERROUS SULFATE 325 (65 FE) MG PO TABS
325.0000 mg | ORAL_TABLET | Freq: Every day | ORAL | Status: DC
  Administered 2024-09-09 – 2024-09-12 (×4): 325 mg via ORAL
  Filled 2024-09-08 (×4): qty 1

## 2024-09-08 MED ORDER — SODIUM CHLORIDE 0.9% FLUSH
3.0000 mL | Freq: Two times a day (BID) | INTRAVENOUS | Status: DC
  Administered 2024-09-08 – 2024-09-12 (×7): 3 mL via INTRAVENOUS

## 2024-09-08 MED ORDER — POTASSIUM CHLORIDE CRYS ER 20 MEQ PO TBCR
40.0000 meq | EXTENDED_RELEASE_TABLET | Freq: Once | ORAL | Status: AC
  Administered 2024-09-08: 40 meq via ORAL
  Filled 2024-09-08: qty 2

## 2024-09-08 MED ORDER — ONDANSETRON HCL 4 MG/2ML IJ SOLN: 4.0000 mg | Freq: Four times a day (QID) | INTRAMUSCULAR | Status: DC | PRN

## 2024-09-08 MED ORDER — DIPHENHYDRAMINE HCL 25 MG PO CAPS
25.0000 mg | ORAL_CAPSULE | Freq: Once | ORAL | Status: AC
  Administered 2024-09-08: 25 mg via ORAL
  Filled 2024-09-08: qty 1

## 2024-09-08 MED ORDER — OXYCODONE HCL 5 MG PO TABS
5.0000 mg | ORAL_TABLET | ORAL | Status: DC | PRN
  Administered 2024-09-08 – 2024-09-10 (×3): 5 mg via ORAL
  Filled 2024-09-08 (×3): qty 1

## 2024-09-08 MED ORDER — ONDANSETRON HCL 4 MG PO TABS: 4.0000 mg | ORAL_TABLET | Freq: Four times a day (QID) | ORAL | Status: DC | PRN

## 2024-09-08 MED ORDER — SODIUM CHLORIDE 0.9% IV SOLUTION: Freq: Once | INTRAVENOUS | Status: AC

## 2024-09-08 MED ORDER — SODIUM CHLORIDE 0.9 % IV SOLN
2.0000 g | Freq: Once | INTRAVENOUS | Status: AC
  Administered 2024-09-08: 2 g via INTRAVENOUS
  Filled 2024-09-08: qty 12.5

## 2024-09-08 MED ORDER — LOSARTAN POTASSIUM 50 MG PO TABS
50.0000 mg | ORAL_TABLET | Freq: Every day | ORAL | Status: DC
  Administered 2024-09-08 – 2024-09-12 (×5): 50 mg via ORAL
  Filled 2024-09-08 (×5): qty 1

## 2024-09-08 MED ORDER — TIZANIDINE HCL 4 MG PO TABS
4.0000 mg | ORAL_TABLET | Freq: Two times a day (BID) | ORAL | Status: DC | PRN
Start: 2024-09-08 — End: 2024-09-12

## 2024-09-08 MED ORDER — IPRATROPIUM-ALBUTEROL 0.5-2.5 (3) MG/3ML IN SOLN
3.0000 mL | RESPIRATORY_TRACT | Status: DC | PRN
Start: 2024-09-08 — End: 2024-09-12

## 2024-09-08 MED ORDER — SODIUM CHLORIDE 0.9 % IV SOLN: INTRAVENOUS | Status: DC

## 2024-09-08 MED ORDER — ACETAMINOPHEN 325 MG PO TABS
650.0000 mg | ORAL_TABLET | Freq: Once | ORAL | Status: AC
  Administered 2024-09-08: 650 mg via ORAL
  Filled 2024-09-08: qty 2

## 2024-09-08 MED ORDER — SODIUM CHLORIDE 0.9 % IV SOLN
2.0000 g | Freq: Three times a day (TID) | INTRAVENOUS | Status: AC
  Administered 2024-09-08 – 2024-09-10 (×7): 2 g via INTRAVENOUS
  Filled 2024-09-08 (×7): qty 12.5

## 2024-09-08 MED ORDER — THIAMINE MONONITRATE 100 MG PO TABS
100.0000 mg | ORAL_TABLET | Freq: Every morning | ORAL | Status: DC
  Administered 2024-09-09 – 2024-09-12 (×4): 100 mg via ORAL
  Filled 2024-09-08 (×4): qty 1

## 2024-09-08 MED ORDER — ACETAMINOPHEN 325 MG PO TABS: 650.0000 mg | ORAL_TABLET | Freq: Four times a day (QID) | ORAL | Status: DC | PRN

## 2024-09-08 MED ORDER — POLYETHYLENE GLYCOL 3350 17 G PO PACK: 17.0000 g | PACK | Freq: Every day | ORAL | Status: DC | PRN

## 2024-09-08 MED ORDER — ACYCLOVIR 400 MG PO TABS
400.0000 mg | ORAL_TABLET | Freq: Two times a day (BID) | ORAL | Status: DC
  Administered 2024-09-08 – 2024-09-12 (×8): 400 mg via ORAL
  Filled 2024-09-08 (×8): qty 1

## 2024-09-08 MED ORDER — CARVEDILOL 3.125 MG PO TABS
3.1250 mg | ORAL_TABLET | Freq: Two times a day (BID) | ORAL | Status: DC
  Administered 2024-09-08: 3.125 mg via ORAL
  Filled 2024-09-08 (×4): qty 1

## 2024-09-08 MED ORDER — SODIUM CHLORIDE 0.9% IV SOLUTION: Freq: Once | INTRAVENOUS | Status: DC

## 2024-09-08 NOTE — Progress Notes (Signed)
 Upon arriving to infusion appointment, patient reported extreme fatigue, SOB with minimal exertion, and 9/10 pain in knees, shoulders, and low back.  Pulse 112; other VSS.  Patient's mother reports patient having dark, tarry stools for the past week.  Dr. Federico assessed patient at chairside, recommended patient be admitted.  Patient agreeable.  Patient transferred to ED via wheelchair.  Mother at bedside.  Report given to ED RN.

## 2024-09-08 NOTE — H&P (Signed)
 History and Physical    Carla Hanson FMW:[id number] DOB: 29-Apr-1966 DOA: 09/08/2024  PCP: Anders Otto DASEN, MD  Patient coming from: Home via cancer center  I have personally briefly reviewed patient's old medical records in [facility]  Chief Complaint: Generalized weakness/dyspnea on exertion  HPI: Carla Hanson is a 58 y.o. 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  Patient denies any fevers, no chills, no nausea, no vomiting, no chest pain, no constipation, no dysuria, no hematemesis, no hematochezia, no melena.  No syncopal episode.  No productive cough.  Patient states mother felt stool may have been dark.  Patient seen at the cancer center labs obtained noted a pancytopenia with a white count of 3.7, hemoglobin of 6.9, platelet count of 55,000 and patient subsequently sent to the ED.  ED Course: Patient seen in the ED, repeat labs with a white count of 2.9, hemoglobin of 5.2, platelet count of 46K.  Comprehensive metabolic profile obtained at cancer center with a potassium of 3.2, glucose of 131, calcium  of 11.7, alk phosphatase of 187, albumin of 3.4, AST of 68 otherwise within normal limits.  Urinalysis pending.  Chest x-ray done with no acute intrathoracic process.  Chronic healed bilateral rib fractures.  Numerous small lytic lesions seen on recent PET scan and not as well-visualized by x-ray.  1 unit of PRBCs ordered by ED physician.  Hospitalist called to admit the patient.  Review of Systems: As per HPI otherwise all other systems reviewed and are negative.  Past  Medical History:  Diagnosis Date   Alcohol abuse    Anxiety    Back pain    Bipolar disorder (HCC)    Bronchitis    COPD exacerbation (HCC) 05/10/2016   Drug use    Edema 02/24/2024   History of blood clots    Hyperlipidemia    Hypertension    Influenza A 12/01/2018   Joint pain    Left leg DVT (HCC) 09/29/2013   Lipoma    Abdomen   LIPOMA 01/20/2008   Obesity    Occasional tremors 05/12/2019   Osteoarthritis of right knee    Other fatigue    Painful menstrual periods 01/05/2012   Pneumonia    Prediabetes    Seizure (HCC)    Shortness of breath on exertion    Stroke (HCC)    Tobacco abuse     Past Surgical History:  Procedure Laterality Date   CESAREAN SECTION     CYSTECTOMY     LIPOMA EXCISION  03/2011   ORIF ANKLE FRACTURE  03/13/2012   Procedure: OPEN REDUCTION INTERNAL FIXATION (ORIF) ANKLE FRACTURE;  Surgeon: Lynwood FORBES Better, MD;  Location: [facility];  Service: Orthopedics;  Laterality: Right;   TUBAL LIGATION      Social History  reports that she quit smoking about 7 years ago. Her smoking use included cigarettes. She started smoking about 42 years ago. She has a 17.5 pack-year smoking history. She has never used smokeless tobacco. She reports current drug use. Drug: Marijuana. She reports that she does not drink alcohol.  Allergies  Allergen Reactions   Latuda  [Lurasidone ] Other (See  Comments)    Made leg muscles twitch (went off this herself in December, 2017)    Family History  Problem Relation Age of Onset   Other Mother        High blood pressure runs in the family   Obesity Mother    Asthma Father    Heart attack Father    Obesity Father    Diabetes Maternal Aunt    Breast cancer Neg Hx    Mother alive age 66 with history of allergies and hypertension.  Father deceased age 69 from an acute MI.  Prior to Admission medications   Medication Sig Start Date End Date Taking? Authorizing Provider  acetaminophen  (TYLENOL ) 325 MG tablet Take 650 mg by  mouth every 6 (six) hours as needed for mild pain (pain score 1-3).    [provider]  acetaminophen  (TYLENOL ) 325 MG tablet Take 2 tablets (650 mg total) by mouth every 6 (six) hours  if needed for mild pain (1-3) for up to 5 days. 08/18/24     acyclovir  (ZOVIRAX ) 400 MG tablet Take 1 tablet (400 mg total) by mouth 2 (two) times daily. 08/06/24   Onesimo Emaline Brink, MD  albuterol  (PROVENTIL ) (2.5 MG/3ML) 0.083% nebulizer solution Take 3 mLs (2.5 mg total) by nebulization every 6 (six) hours as needed for wheezing or shortness of breath. 02/06/24   Anders Otto DASEN, MD  albuterol  (VENTOLIN  HFA) 108 (90 Base) MCG/ACT inhaler Inhale 2 puffs into the lungs every 6 (six) hours as needed for wheezing or shortness of breath. 04/26/24   Anders Otto DASEN, MD  aspirin  EC 81 MG EC tablet Take 1 tablet (81 mg total) by mouth daily. 11/14/16   Noemi Reena CROME, NP  carvedilol (COREG) 3.125 MG tablet Take 1 tablet (3.125 mg total) by mouth in the morning and evening with meals. 08/18/24     cetirizine  (ZYRTEC ) 10 MG tablet Take 1 tablet (10 mg total) by mouth daily. 08/06/24   Anders Otto DASEN, MD  ferrous sulfate  325 (65 FE) MG tablet Take 325 mg by mouth daily with breakfast.    [provider]  losartan  (COZAAR ) 50 MG tablet Take 1 tablet (50 mg total) by mouth daily. 07/19/24   Verdon Parry D, MD  potassium chloride  SA (KLOR-CON  M) 20 MEQ tablet Take 2 tablets (40 mEq total) by mouth 3 (three) times daily for 15 days. Patient not taking: Reported on 09/08/2024 08/25/24 09/09/24  Onesimo Emaline Brink, MD  prochlorperazine  (COMPAZINE ) 10 MG tablet Take 1 tablet (10 mg total) by mouth every 6 (six) hours as needed for nausea or vomiting 07/06/24   Onesimo Emaline Brink, MD  rosuvastatin  (CRESTOR ) 20 MG tablet Take 1 tablet (20 mg total) by mouth daily. 05/20/24   Anders Otto DASEN, MD  thiamine (VITAMIN B1) 100 MG tablet Take 1 tablet (100 mg total) by mouth every morning. 08/18/24     tiZANidine   (ZANAFLEX ) 4 MG tablet Take 1 tablet (4 mg total) by mouth 2 (two) times daily as needed for muscle spasms. 08/06/24   Anders Otto DASEN, MD    Physical Exam: Vitals:   09/08/24 1525 09/08/24 1526  BP: (!) 148/70   Pulse: (!) 109   Resp: 18   Temp: (!) 100.4 F (38 C)   TempSrc: Oral   SpO2: 98%   Weight:  104.3 kg  Height:  5' 1.5 (1.562 m)    Constitutional: NAD, calm, comfortable Vitals:   09/08/24 1525 09/08/24 1526  BP: ROLLEN)  148/70   Pulse: (!) 109   Resp: 18   Temp: (!) 100.4 F (38 C)   TempSrc: Oral   SpO2: 98%   Weight:  104.3 kg  Height:  5' 1.5 (1.562 m)   Eyes: PERRL, lids and conjunctivae normal ENMT: Mucous membranes are dry. Posterior pharynx clear of any exudate or lesions.Normal dentition.  Neck: normal, supple, no masses, no thyromegaly Respiratory: clear to auscultation bilaterally, no wheezing, no crackles. Normal respiratory effort. No accessory muscle use.  Cardiovascular: Tachycardia, no murmurs / rubs / gallops. No extremity edema. 2+ pedal pulses. No carotid bruits.  Abdomen: no tenderness, no masses palpated. No hepatosplenomegaly. Bowel sounds positive.  Musculoskeletal: no clubbing / cyanosis. No joint deformity upper and lower extremities. Good ROM, no contractures. Normal muscle tone.  Skin: no rashes, lesions, ulcers. No induration Neurologic: CN 2-12 grossly intact. Sensation intact, DTR normal. Strength 5/5 in all 4.  Psychiatric: Normal judgment and insight. Alert and oriented x 3. Normal mood.   Labs on Admission: I have personally reviewed following labs and imaging studies  CBC: Recent Labs  Lab 09/08/24 1337 09/08/24 1553  WBC 3.7* 2.9*  NEUTROABS 0.4* PENDING  HGB 6.9* 5.2*  HCT 20.6* 16.8*  MCV 79.2* 83.2  PLT 55* 46*    Basic Metabolic Panel: Recent Labs  Lab 09/08/24 1337  NA 136  K 3.2*  CL 98  CO2 26  GLUCOSE 131*  BUN 17  CREATININE 0.58  CALCIUM  11.7*    GFR: Estimated Creatinine Clearance: 87.1  mL/min (by C-G formula based on SCr of 0.58 mg/dL).  Liver Function Tests: Recent Labs  Lab 09/08/24 1337  AST 68*  ALT 14  ALKPHOS 187*  BILITOT 0.4  PROT 8.0  ALBUMIN 3.4*    Urine analysis:    Component Value Date/Time   COLORURINE YELLOW 11/08/2016 1117   APPEARANCEUR HAZY (A) 11/08/2016 1117   LABSPEC 1.020 11/08/2016 1117   PHURINE 6.0 11/08/2016 1117   GLUCOSEU NEGATIVE 11/08/2016 1117   HGBUR NEGATIVE 11/08/2016 1117   BILIRUBINUR NEGATIVE 11/08/2016 1117   BILIRUBINUR NEG 12/29/2013 0900   KETONESUR 5 (A) 11/08/2016 1117   PROTEINUR 30 (A) 11/08/2016 1117   UROBILINOGEN 0.2 12/29/2013 0900   UROBILINOGEN 0.2 03/07/2011 1430   NITRITE NEGATIVE 11/08/2016 1117   LEUKOCYTESUR TRACE (A) 11/08/2016 1117    Radiological Exams on Admission: DG Chest Port 1 View if patient is in a treatment room. Result Date: 09/08/2024 CLINICAL DATA:  Anemia, dark stools, suspected sepsis EXAM: PORTABLE CHEST 1 VIEW COMPARISON:  01/30/2024 FINDINGS: Single frontal view of the chest demonstrates stable enlargement of the cardiac silhouette. No acute airspace disease, effusion, or pneumothorax. Multiple healed bilateral rib fractures are noted. The multiple small lytic lesions seen on prior PET scan are not as well visualized by x-ray. IMPRESSION: 1. No acute intrathoracic process. 2. Chronic healed bilateral rib fractures. The numerous small lytic lesions seen on recent PET scan are not as well visualized by x-ray. Electronically Signed   By: Ozell Daring M.D.   On: 09/08/2024 16:20    EKG: Independently reviewed.  Sinus tachycardia  Assessment/Plan Principal Problem:   SIRS (systemic inflammatory response syndrome) (HCC) Active Problems:   Pancytopenia (HCC)   Symptomatic anemia   Hypokalemia   Hyperlipidemia   Essential hypertension   Iron deficiency anemia   History of hemorrhagic cerebrovascular accident (CVA) without residual deficits   COPD, moderate (HCC)   History of  seizure   Hypercalcemia of malignancy  Multiple myeloma not having achieved remission (HCC)  (please populate well all problems here in Problem List. (For example, if patient is on BP meds at home and you resume or decide to hold them, it is a problem that needs to be her. Same for CAD, COPD, HLD and so on)  #1 SIRS -Patient on admission met criteria for SIRS with a low-grade temp of 100.4, noted to be tachycardic with heart rates as high as 113. - Patient with a pancytopenia. - Blood cultures obtained pending. - Chest x-ray negative for any acute infiltrate. - Urinalysis pending. - Continue empiric IV cefepime. - IV fluids, supportive care.  2.  Symptomatic anemia/pancytopenia -Patient presenting with 1 day history of generalized weakness, dyspnea on exertion similar to episodes of anemia in the past per patient. -Likely secondary to multiple myeloma in the setting of recent chemotherapy. - Patient denies any overt bleeding. - Patient noted to be on oral iron supplementation at home. - Checking anemia panel. -Check FOBT. - Repeat CBC done in the ED with a hemoglobin of 5.2, white count of 2.9, platelet of 46K. - 1 unit of PRBCs ordered by ED to be transfused. - Will order another unit of PRBCs. - Oncology informed of admission via epic. - PPI.  3.  Hypokalemia -Check a magnesium  level. Replete.-  4.  Multiple myeloma -Patient noted to be on carfilzomib, Revlimid, dexamethasonE . - Patient had presented to the cancer center/infusion center and sent to the ED. - Hematology/oncology informed of admission via epic.  5.  Hypertension -Resume home regimen of Coreg, Cozaar .  6.  Hyperlipidemia -Hold statin due to presentation with generalized weakness.  7.  Hypercalcemia of malignancy -IV fluids. - Repeat labs in the AM. - If still elevated may need to consider calcitonin and zoledronic  acid. - Follow.  8.  Generalized weakness -Secondary to symptomatic anemia. - Patient  to be transfused 2 units PRBCs. - Hold statin. - PT/OT.  DVT prophylaxis: SCDs Code Status:   Full Family Communication:  Updated patient and mother at bedside. Disposition Plan:   Patient is from:  Home  Anticipated DC to:  Home  Anticipated DC date:  TBD  Anticipated DC barriers: Clinical improvement  Consults called: Oncology informed of admission via epic. Admission status:  Place in observation/telemetry  Severity of Illness: The appropriate patient status for this patient is OBSERVATION. Observation status is judged to be reasonable and necessary in order to provide the required intensity of service to ensure the patient's safety. The patient's presenting symptoms, physical exam findings, and initial radiographic and laboratory data in the context of their medical condition is felt to place them at decreased risk for further clinical deterioration. Furthermore, it is anticipated that the patient will be medically stable for discharge from the hospital within 2 midnights of admission.     Toribio Hummer MD [facility]  How to contact the [facility] Attending or Consulting provider 7A - 7P or covering provider during after hours 7P -7A, for this patient?   Check the care team in [facility] and look for a) attending/consulting [facility] provider listed and b) the [facility] team listed Log into [email or web address] and use 's universal password to access. If you do not have the password, please contact the hospital operator. Locate the [facility] provider you are looking for under [facility] and page to a number that you can be directly reached. If you still have difficulty reaching the provider, please page the [facility] (Director on Call) for the Hospitalists  listed on amion for assistance.  09/08/2024, 5:30 PM

## 2024-09-08 NOTE — Progress Notes (Signed)
 A consult was received from an ED physician for cefepime per pharmacy dosing (for an indication other than meningitis). The patient's profile has been reviewed for ht/wt/allergies/indication/available labs. A one time order has been placed for the above antibiotics.  Further antibiotics/pharmacy consults should be ordered by admitting physician if indicated.                       Bard Jeans, PharmD, BCPS [phone] 09/08/2024, 4:07 PM

## 2024-09-08 NOTE — Patient Instructions (Signed)

## 2024-09-08 NOTE — ED Provider Notes (Addendum)
 [place] EMERGENCY DEPARTMENT AT [facility] Provider Note   CSN: [id number] Arrival date & time: 09/08/24  1505     Patient presents with: Abnormal Lab   Carla Hanson is a 58 y.o. female.   58 yo F with a chief complaints of feeling rundown.  Patient went to the cancer center today for an infusion and was found to be pancytopenic sent for admission.  Patient with neutropenia.  Febrile here to 100.4.  No fevers at home.  Complaining of some lower extremity discomfort.  Denies urinary symptoms denies cough congestion.  Denies abdominal pain denies nausea vomiting or diarrhea.   Abnormal Lab      Prior to Admission medications   Medication Sig Start Date End Date Taking? Authorizing Provider  acetaminophen  (TYLENOL ) 325 MG tablet Take 650 mg by mouth every 6 (six) hours as needed for mild pain (pain score 1-3).    [provider]  acetaminophen  (TYLENOL ) 325 MG tablet Take 2 tablets (650 mg total) by mouth every 6 (six) hours  if needed for mild pain (1-3) for up to 5 days. 08/18/24     acyclovir  (ZOVIRAX ) 400 MG tablet Take 1 tablet (400 mg total) by mouth 2 (two) times daily. 08/06/24   Onesimo Emaline Brink, MD  albuterol  (PROVENTIL ) (2.5 MG/3ML) 0.083% nebulizer solution Take 3 mLs (2.5 mg total) by nebulization every 6 (six) hours as needed for wheezing or shortness of breath. 02/06/24   Anders Otto DASEN, MD  albuterol  (VENTOLIN  HFA) 108 (90 Base) MCG/ACT inhaler Inhale 2 puffs into the lungs every 6 (six) hours as needed for wheezing or shortness of breath. 04/26/24   Anders Otto DASEN, MD  aspirin  EC 81 MG EC tablet Take 1 tablet (81 mg total) by mouth daily. 11/14/16   Noemi Reena LITTIE, NP  carvedilol (COREG) 3.125 MG tablet Take 1 tablet (3.125 mg total) by mouth in the morning and evening with meals. 08/18/24     cetirizine  (ZYRTEC ) 10 MG tablet Take 1 tablet (10 mg total) by mouth daily. 08/06/24   Anders Otto DASEN, MD  ferrous sulfate  325 (65 FE) MG tablet  Take 325 mg by mouth daily with breakfast.    [provider]  losartan  (COZAAR ) 50 MG tablet Take 1 tablet (50 mg total) by mouth daily. 07/19/24   Verdon Parry D, MD  potassium chloride  SA (KLOR-CON  M) 20 MEQ tablet Take 2 tablets (40 mEq total) by mouth 3 (three) times daily for 15 days. Patient not taking: Reported on 09/08/2024 08/25/24 09/09/24  Onesimo Emaline Brink, MD  prochlorperazine  (COMPAZINE ) 10 MG tablet Take 1 tablet (10 mg total) by mouth every 6 (six) hours as needed for nausea or vomiting 07/06/24   Onesimo Emaline Brink, MD  rosuvastatin  (CRESTOR ) 20 MG tablet Take 1 tablet (20 mg total) by mouth daily. 05/20/24   Anders Otto DASEN, MD  thiamine (VITAMIN B1) 100 MG tablet Take 1 tablet (100 mg total) by mouth every morning. 08/18/24     tiZANidine  (ZANAFLEX ) 4 MG tablet Take 1 tablet (4 mg total) by mouth 2 (two) times daily as needed for muscle spasms. 08/06/24   Anders Otto DASEN, MD    Allergies: Latuda  [lurasidone ]    Review of Systems  Updated Vital Signs BP (!) 148/70 (BP Location: Right Arm)   Pulse (!) 109   Temp (!) 100.4 F (38 C) (Oral)   Resp 18   Ht 5' 1.5 (1.562 m)   Wt 104.3 kg  LMP  (LMP Unknown)   SpO2 98%   BMI 42.75 kg/m   Physical Exam Vitals and nursing note reviewed.  Constitutional:      General: She is not in acute distress.    Appearance: She is well-developed. She is not diaphoretic.     Comments: pale  HENT:     Head: Normocephalic and atraumatic.  Eyes:     Pupils: Pupils are equal, round, and reactive to light.  Cardiovascular:     Rate and Rhythm: Normal rate and regular rhythm.     Heart sounds: No murmur heard.    No friction rub. No gallop.  Pulmonary:     Effort: Pulmonary effort is normal.     Breath sounds: No wheezing or rales.  Abdominal:     General: There is no distension.     Palpations: Abdomen is soft.     Tenderness: There is no abdominal tenderness.  Musculoskeletal:        General: No tenderness.      Cervical back: Normal range of motion and neck supple.  Skin:    General: Skin is warm and dry.  Neurological:     Mental Status: She is alert and oriented to person, place, and time.  Psychiatric:        Behavior: Behavior normal.     (all labs ordered are listed, but only abnormal results are displayed) Labs Reviewed  CULTURE, BLOOD (ROUTINE X 2)  CULTURE, BLOOD (ROUTINE X 2)  RESP PANEL BY RT-PCR (RSV, FLU A&B, COVID)  RVPGX2  PROTIME-INR  CBC WITH DIFFERENTIAL/PLATELET  URINALYSIS, W/ REFLEX TO CULTURE (INFECTION SUSPECTED)  OCCULT BLOOD X 1 CARD TO LAB, STOOL  MAGNESIUM   I-STAT CG4 LACTIC ACID, ED  POC OCCULT BLOOD, ED  TYPE AND SCREEN  PREPARE RBC (CROSSMATCH)    EKG: None  Radiology: DG Chest Port 1 View if patient is in a treatment room. Result Date: 09/08/2024 CLINICAL DATA:  Anemia, dark stools, suspected sepsis EXAM: PORTABLE CHEST 1 VIEW COMPARISON:  01/30/2024 FINDINGS: Single frontal view of the chest demonstrates stable enlargement of the cardiac silhouette. No acute airspace disease, effusion, or pneumothorax. Multiple healed bilateral rib fractures are noted. The multiple small lytic lesions seen on prior PET scan are not as well visualized by x-ray. IMPRESSION: 1. No acute intrathoracic process. 2. Chronic healed bilateral rib fractures. The numerous small lytic lesions seen on recent PET scan are not as well visualized by x-ray. Electronically Signed   By: Ozell Daring M.D.   On: 09/08/2024 16:20     .Critical Care  Performed by: Emil Share, DO Authorized by: Emil Share, DO   Critical care provider statement:    Critical care time (minutes):  35   Critical care time was exclusive of:  Separately billable procedures and treating other patients   Critical care was time spent personally by me on the following activities:  Development of treatment plan with patient or surrogate, discussions with consultants, evaluation of patient's response to treatment,  examination of patient, ordering and review of laboratory studies, ordering and review of radiographic studies, ordering and performing treatments and interventions, pulse oximetry, re-evaluation of patient's condition and review of old charts   Care discussed with: admitting provider      Medications Ordered in the ED  0.9 %  sodium chloride  infusion (Manually program via Guardrails IV Fluids) (has no administration in time range)  0.9 %  sodium chloride  infusion ( Intravenous New Bag/Given 09/08/24 1630)  ceFEPIme (  MAXIPIME) 2 g in sodium chloride  0.9 % 100 mL IVPB (0 g Intravenous Stopped 09/08/24 1634)  potassium chloride  SA (KLOR-CON  M) CR tablet 40 mEq (40 mEq Oral Given 09/08/24 1621)                                    Medical Decision Making Amount and/or Complexity of Data Reviewed Labs: ordered. Radiology: ordered.  Risk Prescription drug management. Decision regarding hospitalization.   58 yo F with a chief complaints of not feeling well.  Going on for couple days.   seen by infusion clinic today was sent down for admission.  Patient's hemoglobin is low.  Neutropenic.  Febrile here to 100.4.  Chest x-ray on my independent interpretation with possible left lower lobe infiltrate.  Started on cefepime.  Discussed with medicine for admission.  The patients results and plan were reviewed and discussed.   Any x-rays performed were independently reviewed by myself.   Differential diagnosis were considered with the presenting HPI.  Medications  0.9 %  sodium chloride  infusion (Manually program via Guardrails IV Fluids) (has no administration in time range)  0.9 %  sodium chloride  infusion ( Intravenous New Bag/Given 09/08/24 1630)  ceFEPIme (MAXIPIME) 2 g in sodium chloride  0.9 % 100 mL IVPB (0 g Intravenous Stopped 09/08/24 1634)  potassium chloride  SA (KLOR-CON  M) CR tablet 40 mEq (40 mEq Oral Given 09/08/24 1621)    Vitals:   09/08/24 1525 09/08/24 1526  BP: (!) 148/70    Pulse: (!) 109   Resp: 18   Temp: (!) 100.4 F (38 C)   TempSrc: Oral   SpO2: 98%   Weight:  104.3 kg  Height:  5' 1.5 (1.562 m)    Final diagnoses:  Symptomatic anemia  Chemotherapy-induced neutropenia    Admission/ observation were discussed with the admitting physician, patient and/or family and they are comfortable with the plan.       Final diagnoses:  Symptomatic anemia  Chemotherapy-induced neutropenia    ED Discharge Orders     None          Emil Share, DO 09/08/24 1638    Emil Share, DO 09/08/24 [phone]

## 2024-09-08 NOTE — ED Triage Notes (Signed)
 Pt arrives from inpatient cancer center due to a low hemoglobin. Her hgb was 6.9 today at the cancer center and her mother reports she has noticed the patient has had dark stools for the last few days. The patient reports having  and she reports generalized body aches, feeling tired and weak with exertional shortness of breath.  Hx of multiple myeloma and last chemo treatment was Oct 2. She did not receive today's chemo treatment.

## 2024-09-08 NOTE — Progress Notes (Addendum)
 CRITICAL VALUE STICKER  CRITICAL VALUE: HGB: 6.9, ANC 0.4  DATE & TIME NOTIFIED: 09/08/24 13:51  MD NOTIFIED: Federico  TIME OF NOTIFICATION:1:55

## 2024-09-09 DIAGNOSIS — D61810 Antineoplastic chemotherapy induced pancytopenia: Secondary | ICD-10-CM | POA: Diagnosis not present

## 2024-09-09 DIAGNOSIS — Z87891 Personal history of nicotine dependence: Secondary | ICD-10-CM | POA: Diagnosis not present

## 2024-09-09 DIAGNOSIS — T451X5A Adverse effect of antineoplastic and immunosuppressive drugs, initial encounter: Secondary | ICD-10-CM

## 2024-09-09 DIAGNOSIS — Z1152 Encounter for screening for COVID-19: Secondary | ICD-10-CM | POA: Diagnosis not present

## 2024-09-09 DIAGNOSIS — S2243XD Multiple fractures of ribs, bilateral, subsequent encounter for fracture with routine healing: Secondary | ICD-10-CM | POA: Diagnosis not present

## 2024-09-09 DIAGNOSIS — E538 Deficiency of other specified B group vitamins: Secondary | ICD-10-CM | POA: Diagnosis not present

## 2024-09-09 DIAGNOSIS — Z87898 Personal history of other specified conditions: Secondary | ICD-10-CM | POA: Diagnosis not present

## 2024-09-09 DIAGNOSIS — C9000 Multiple myeloma not having achieved remission: Secondary | ICD-10-CM | POA: Diagnosis not present

## 2024-09-09 DIAGNOSIS — E876 Hypokalemia: Secondary | ICD-10-CM | POA: Diagnosis not present

## 2024-09-09 DIAGNOSIS — Z825 Family history of asthma and other chronic lower respiratory diseases: Secondary | ICD-10-CM | POA: Diagnosis not present

## 2024-09-09 DIAGNOSIS — R000 Tachycardia, unspecified: Secondary | ICD-10-CM | POA: Diagnosis not present

## 2024-09-09 DIAGNOSIS — R5081 Fever presenting with conditions classified elsewhere: Secondary | ICD-10-CM | POA: Diagnosis not present

## 2024-09-09 DIAGNOSIS — Z8673 Personal history of transient ischemic attack (TIA), and cerebral infarction without residual deficits: Secondary | ICD-10-CM | POA: Diagnosis not present

## 2024-09-09 DIAGNOSIS — Z7982 Long term (current) use of aspirin: Secondary | ICD-10-CM | POA: Diagnosis not present

## 2024-09-09 DIAGNOSIS — J449 Chronic obstructive pulmonary disease, unspecified: Secondary | ICD-10-CM | POA: Diagnosis not present

## 2024-09-09 DIAGNOSIS — D709 Neutropenia, unspecified: Secondary | ICD-10-CM | POA: Diagnosis not present

## 2024-09-09 DIAGNOSIS — Z86718 Personal history of other venous thrombosis and embolism: Secondary | ICD-10-CM | POA: Diagnosis not present

## 2024-09-09 DIAGNOSIS — Z6841 Body Mass Index (BMI) 40.0 and over, adult: Secondary | ICD-10-CM | POA: Diagnosis not present

## 2024-09-09 DIAGNOSIS — D701 Agranulocytosis secondary to cancer chemotherapy: Secondary | ICD-10-CM

## 2024-09-09 DIAGNOSIS — D61818 Other pancytopenia: Secondary | ICD-10-CM | POA: Diagnosis not present

## 2024-09-09 DIAGNOSIS — R6510 Systemic inflammatory response syndrome (SIRS) of non-infectious origin without acute organ dysfunction: Secondary | ICD-10-CM | POA: Diagnosis not present

## 2024-09-09 DIAGNOSIS — E785 Hyperlipidemia, unspecified: Secondary | ICD-10-CM | POA: Diagnosis not present

## 2024-09-09 DIAGNOSIS — D509 Iron deficiency anemia, unspecified: Secondary | ICD-10-CM | POA: Diagnosis not present

## 2024-09-09 DIAGNOSIS — E66813 Obesity, class 3: Secondary | ICD-10-CM | POA: Diagnosis not present

## 2024-09-09 DIAGNOSIS — I10 Essential (primary) hypertension: Secondary | ICD-10-CM | POA: Diagnosis not present

## 2024-09-09 DIAGNOSIS — D649 Anemia, unspecified: Secondary | ICD-10-CM | POA: Diagnosis not present

## 2024-09-09 DIAGNOSIS — Z8249 Family history of ischemic heart disease and other diseases of the circulatory system: Secondary | ICD-10-CM | POA: Diagnosis not present

## 2024-09-09 DIAGNOSIS — Z79899 Other long term (current) drug therapy: Secondary | ICD-10-CM | POA: Diagnosis not present

## 2024-09-09 DIAGNOSIS — G40909 Epilepsy, unspecified, not intractable, without status epilepticus: Secondary | ICD-10-CM | POA: Diagnosis not present

## 2024-09-09 DIAGNOSIS — E782 Mixed hyperlipidemia: Secondary | ICD-10-CM | POA: Diagnosis not present

## 2024-09-09 DIAGNOSIS — Z833 Family history of diabetes mellitus: Secondary | ICD-10-CM | POA: Diagnosis not present

## 2024-09-09 LAB — FOLATE: Folate: 3.6 ng/mL — ABNORMAL LOW (ref >5.9)

## 2024-09-09 LAB — COMPREHENSIVE METABOLIC PANEL WITH GFR
ALT: 16 U/L (ref 0–44)
AST: 215 U/L — ABNORMAL HIGH (ref 15–41)
Albumin: 2.8 g/dL — ABNORMAL LOW (ref 3.5–5.0)
Alkaline Phosphatase: 188 U/L — ABNORMAL HIGH (ref 38–126)
Anion gap: 9 (ref 5–15)
BUN: 17 mg/dL (ref 6–20)
CO2: 25 mmol/L (ref 22–32)
Calcium: 11.2 mg/dL — ABNORMAL HIGH (ref 8.9–10.3)
Chloride: 104 mmol/L (ref 98–111)
Creatinine, Ser: 0.58 mg/dL (ref 0.44–1.00)
GFR, Estimated: >60 mL/min (ref >60)
Glucose, Bld: 133 mg/dL — ABNORMAL HIGH (ref 70–99)
Potassium: 3.7 mmol/L (ref 3.5–5.1)
Sodium: 137 mmol/L (ref 135–145)
Total Bilirubin: 0.3 mg/dL (ref 0.0–1.2)
Total Protein: 6.6 g/dL (ref 6.5–8.1)

## 2024-09-09 LAB — CBC WITH DIFFERENTIAL/PLATELET
Abs Immature Granulocytes: 0.12 K/uL — ABNORMAL HIGH (ref 0.00–0.07)
Basophils Absolute: 0 K/uL (ref 0.0–0.1)
Basophils Relative: 0 %
Eosinophils Absolute: 0 K/uL (ref 0.0–0.5)
Eosinophils Relative: 0 %
HCT: 25.4 % — ABNORMAL LOW (ref 36.0–46.0)
Hemoglobin: 7.9 g/dL — ABNORMAL LOW (ref 12.0–15.0)
Immature Granulocytes: 5 %
Lymphocytes Relative: 62 %
Lymphs Abs: 1.4 K/uL (ref 0.7–4.0)
MCH: 26.4 pg (ref 26.0–34.0)
MCHC: 31.1 g/dL (ref 30.0–36.0)
MCV: 84.9 fL (ref 80.0–100.0)
Monocytes Absolute: 0.4 K/uL (ref 0.1–1.0)
Monocytes Relative: 19 %
Neutro Abs: 0.3 K/uL — CL (ref 1.7–7.7)
Neutrophils Relative %: 14 %
Platelets: 39 K/uL — ABNORMAL LOW (ref 150–400)
RBC: 2.99 MIL/uL — ABNORMAL LOW (ref 3.87–5.11)
RDW: 14.1 % (ref 11.5–15.5)
WBC: 2.3 K/uL — ABNORMAL LOW (ref 4.0–10.5)
nRBC: 0 % (ref 0.0–0.2)

## 2024-09-09 LAB — CBC
HCT: 20 % — ABNORMAL LOW (ref 36.0–46.0)
Hemoglobin: 6.3 g/dL — CL (ref 12.0–15.0)
MCH: 26.5 pg (ref 26.0–34.0)
MCHC: 31.5 g/dL (ref 30.0–36.0)
MCV: 84 fL (ref 80.0–100.0)
Platelets: 37 K/uL — ABNORMAL LOW (ref 150–400)
RBC: 2.38 MIL/uL — ABNORMAL LOW (ref 3.87–5.11)
RDW: 14.1 % (ref 11.5–15.5)
WBC: 2.4 K/uL — ABNORMAL LOW (ref 4.0–10.5)
nRBC: 0 % (ref 0.0–0.2)

## 2024-09-09 LAB — PREPARE RBC (CROSSMATCH)

## 2024-09-09 LAB — HEMOGLOBIN AND HEMATOCRIT, BLOOD
HCT: 26 % — ABNORMAL LOW (ref 36.0–46.0)
Hemoglobin: 8.4 g/dL — ABNORMAL LOW (ref 12.0–15.0)

## 2024-09-09 LAB — IRON AND TIBC
Iron: 110 ug/dL (ref 28–170)
Saturation Ratios: 50 % — ABNORMAL HIGH (ref 10.4–31.8)
TIBC: 221 ug/dL — ABNORMAL LOW (ref 250–450)
UIBC: 111 ug/dL

## 2024-09-09 LAB — MAGNESIUM: Magnesium: 1.9 mg/dL (ref 1.7–2.4)

## 2024-09-09 LAB — FERRITIN: Ferritin: >7500 ng/mL — ABNORMAL HIGH (ref 11–307)

## 2024-09-09 LAB — VITAMIN B12: Vitamin B-12: 544 pg/mL (ref 180–914)

## 2024-09-09 MED ORDER — ENSURE PLUS HIGH PROTEIN PO LIQD
237.0000 mL | Freq: Two times a day (BID) | ORAL | Status: DC
  Administered 2024-09-10 – 2024-09-12 (×5): 237 mL via ORAL

## 2024-09-09 MED ORDER — FOLIC ACID 1 MG PO TABS
1.0000 mg | ORAL_TABLET | Freq: Every day | ORAL | Status: DC
  Administered 2024-09-09 – 2024-09-12 (×4): 1 mg via ORAL
  Filled 2024-09-09 (×4): qty 1

## 2024-09-09 MED ORDER — SODIUM CHLORIDE 0.9% IV SOLUTION: Freq: Once | INTRAVENOUS | Status: AC

## 2024-09-09 MED ORDER — DIPHENHYDRAMINE HCL 25 MG PO CAPS
25.0000 mg | ORAL_CAPSULE | Freq: Once | ORAL | Status: AC
  Administered 2024-09-09: 25 mg via ORAL
  Filled 2024-09-09: qty 1

## 2024-09-09 MED ORDER — FUROSEMIDE 10 MG/ML IJ SOLN
20.0000 mg | Freq: Once | INTRAMUSCULAR | Status: AC
  Administered 2024-09-09: 20 mg via INTRAVENOUS
  Filled 2024-09-09: qty 2

## 2024-09-09 MED ORDER — ACETAMINOPHEN 325 MG PO TABS
650.0000 mg | ORAL_TABLET | Freq: Once | ORAL | Status: AC
Start: 2024-09-09 — End: 2024-09-09
  Administered 2024-09-09: 650 mg via ORAL
  Filled 2024-09-09: qty 2

## 2024-09-09 MED ORDER — POLYVINYL ALCOHOL 1.4 % OP SOLN: 1.0000 [drp] | Freq: Three times a day (TID) | OPHTHALMIC | Status: DC | PRN

## 2024-09-09 NOTE — Evaluation (Signed)
 Physical Therapy Evaluation Patient Details Name: Carla Hanson MRN: [id number] DOB: 06/24/1966 Today's Date: 09/09/2024  History of Present Illness  Patient is a 58 year old female who presetned to the ED via cancer center with c/o generalized weakness, DOE, and diarrhea.  Hgb 5.2 in ED, given 1 unit PRBC.  Dx with SIRS, anemia / pancytopenia, & hypokalemia.  PMHx includes HTN, HLD, LLE DVT, CVA w/o residual deficits, bronchitis, PNA, DOE, COPD (Hx of smoking), Hx of PE, cystectomy, multiple myeloma (currently under Tx), OA, right ankle Fx (ORIF, 2013), anxiety, bipolar, Hx of ETOH, current cannabis use  Clinical Impression  Pt admitted with above diagnosis.  Pt currently with functional limitations due to the deficits listed below (see PT Problem List). Pt will benefit from acute skilled PT to increase their independence and safety with mobility to allow discharge.     Patient reports feeling fatigued but does want to get up to recliner. Patient required min assistance  and use of Rw.  Patient is independent , uses a rollator  as needed. Patient  resides alone with assistance from her mother. Patient should progress to return home. Recommend HHPT      If plan is discharge home, recommend the following: A little help with bathing/dressing/bathroom;Assistance with cooking/housework;Assist for transportation;Help with stairs or ramp for entrance   Can travel by private vehicle        Equipment Recommendations None recommended by PT  Recommendations for Other Services       Functional Status Assessment Patient has had a recent decline in their functional status and demonstrates the ability to make significant improvements in function in a reasonable and predictable amount of time.     Precautions / Restrictions Precautions Precautions: Fall Precaution/Restrictions Comments: HGB low Restrictions Weight Bearing Restrictions Per Provider Order: No      Mobility  Bed Mobility Overal  bed mobility: Needs Assistance Bed Mobility: Supine to Sit     Supine to sit: HOB elevated, Min assist, Used rails     General bed mobility comments: assist legs over bed edge    Transfers Overall transfer level: Needs assistance Equipment used: Rolling walker (2 wheels) Transfers: Sit to/from Stand, Bed to chair/wheelchair/BSC Sit to Stand: Min assist   Step pivot transfers: Min assist       General transfer comment: moves slowly to step to recliner, cues to reach abck    Ambulation/Gait                  Stairs            Wheelchair Mobility     Tilt Bed    Modified Rankin (Stroke Patients Only)       Balance Overall balance assessment: Needs assistance Sitting-balance support: No upper extremity supported, Feet supported Sitting balance-Leahy Scale: Good     Standing balance support: Reliant on assistive device for balance, Bilateral upper extremity supported, During functional activity Standing balance-Leahy Scale: Poor                               Pertinent Vitals/Pain Pain Assessment Pain Assessment: Faces Faces Pain Scale: Hurts even more Pain Location: legs Pain Descriptors / Indicators: Aching Pain Intervention(s): Monitored during session, Limited activity within patient's tolerance    Home Living Family/patient expects to be discharged to:: Private residence Living Arrangements: Parent;Alone   Type of Home: Apartment Home Access: Level entry  Home Layout: One level Home Equipment: Rollator (4 wheels);Shower seat      Prior Function Prior Level of Function : Independent/Modified Independent                     Extremity/Trunk Assessment        Lower Extremity Assessment Lower Extremity Assessment: Generalized weakness    Cervical / Trunk Assessment Cervical / Trunk Assessment: Normal  Communication   Communication Communication: No apparent difficulties    Cognition Arousal:  Alert Behavior During Therapy: WFL for tasks assessed/performed, Flat affect   PT - Cognitive impairments: No apparent impairments                         Following commands: Intact       Cueing       General Comments      Exercises     Assessment/Plan    PT Assessment Patient needs continued PT services  PT Problem List Decreased strength;Decreased activity tolerance;Decreased mobility;Decreased safety awareness       PT Treatment Interventions DME instruction;Gait training;Functional mobility training;Therapeutic activities;Therapeutic exercise;Patient/family education    PT Goals (Current goals can be found in the Care Plan section)  Acute Rehab PT Goals Patient Stated Goal: get stronger, feel better PT Goal Formulation: With patient/family Time For Goal Achievement: 09/23/24 Potential to Achieve Goals: Good    Frequency Min 3X/week     Co-evaluation               AM-PAC PT 6 Clicks Mobility  Outcome Measure Help needed turning from your back to your side while in a flat bed without using bedrails?: A Little Help needed moving from lying on your back to sitting on the side of a flat bed without using bedrails?: A Little Help needed moving to and from a bed to a chair (including a wheelchair)?: A Little Help needed standing up from a chair using your arms (e.g., wheelchair or bedside chair)?: A Lot Help needed to walk in hospital room?: A Lot Help needed climbing 3-5 steps with a railing? : Total 6 Click Score: 14    End of Session Equipment Utilized During Treatment: Gait belt Activity Tolerance: Patient limited by fatigue Patient left: in chair;with call bell/phone within reach;with family/visitor present Nurse Communication: Mobility status PT Visit Diagnosis: Unsteadiness on feet (R26.81);Muscle weakness (generalized) (M62.81);Difficulty in walking, not elsewhere classified (R26.2)    Time: 1600-1630 PT Time Calculation (min) (ACUTE  ONLY): 30 min   Charges:   PT Evaluation $PT Eval Low Complexity: 1 Low PT Treatments $Therapeutic Activity: 8-22 mins PT General Charges $$ ACUTE PT VISIT: 1 Visit         Darice Potters PT Acute Rehabilitation Services Office [phone]   Potters Darice Norris 09/09/2024, 4:40 PM

## 2024-09-09 NOTE — Plan of Care (Signed)
  Problem: Education: Goal: Knowledge of General Education information will improve Description: Including pain rating scale, medication(s)/side effects and non-pharmacologic comfort measures Outcome: Progressing   Problem: Health Behavior/Discharge Planning: Goal: Ability to manage health-related needs will improve Outcome: Progressing   Problem: Clinical Measurements: Goal: Ability to maintain clinical measurements within normal limits will improve Outcome: Progressing   Problem: Activity: Goal: Risk for activity intolerance will decrease Outcome: Progressing   Problem: Nutrition: Goal: Adequate nutrition will be maintained Outcome: Progressing   Problem: Elimination: Goal: Will not experience complications related to urinary retention Outcome: Progressing   Problem: Pain Managment: Goal: General experience of comfort will improve and/or be controlled Outcome: Progressing   Problem: Safety: Goal: Ability to remain free from injury will improve Outcome: Progressing

## 2024-09-09 NOTE — Progress Notes (Signed)
 PROGRESS NOTE    Carla Hanson  FMW:[id number] DOB: Jul 15, 1966 DOA: 09/08/2024 PCP: Anders Otto DASEN, MD    Chief Complaint  Patient presents with   Abnormal Lab    Brief Narrative:  Patient is 58 year old female history of IgG lambda multiple myeloma being followed by hematology oncology currently on treatment, hypertension, hyperlipidemia, COPD, history of VTE in the past and SAH with anticoagulation, history of prior seizure disorder presented from the cancer center with a 1 day history of worsening generalized weakness, dyspnea on exertion, episode of diarrhea.  Patient noted to be severely anemic with hemoglobin of 5.9 at the cancer center and sent to the ED.  Repeat CBC done with a hemoglobin of 5.2.  Patient transfused 2 units PRBCs.  Patient also noted with concerns for systemic inflammatory response syndrome.   Assessment & Plan:   Principal Problem:   SIRS (systemic inflammatory response syndrome) (HCC) Active Problems:   Pancytopenia (HCC)   Symptomatic anemia   Hypokalemia   Hyperlipidemia   Essential hypertension   Iron deficiency anemia   History of hemorrhagic cerebrovascular accident (CVA) without residual deficits   COPD, moderate (HCC)   History of seizure   Hypercalcemia of malignancy   Multiple myeloma not having achieved remission (HCC)   Chemotherapy-induced neutropenia   Folate deficiency  #1 SIRS -Patient on admission met criteria for SIRS with a low-grade temp of 100.4, noted to be tachycardic with heart rates as high as 113. - Patient with a pancytopenia. - Blood cultures obtained pending. - Chest x-ray negative for any acute infiltrate. - Urinalysis cloudy, nitrite negative, leukocytes negative, many bacteria, 6-10 WBCs.  - Continue empiric IV cefepime and if patient remains afebrile, blood cultures remain negative tomorrow will narrow IV antibiotics. - Supportive care.   2.  Symptomatic anemia/pancytopenia/folate deficiency -Patient  presenting with 1 day history of generalized weakness, dyspnea on exertion similar to episodes of anemia in the past per patient. -Likely secondary to multiple myeloma in the setting of recent chemotherapy. - Patient denies any overt bleeding. - Patient noted to be on oral iron supplementation at home. - Anemia panel with iron level of 110, ferritin > 7500, folate of 3.6, vitamin B12 of 544.  - FOBT pending - Status posttransfusion 1 unit PRBCs with hemoglobin at 6.3 this morning from 5.2 on admission.  - Second unit of PRBCs transfused this morning as hemoglobin noted at 6.3.   - Posttransfusion CBC with a hemoglobin of 7.9.   - Transfuse 1 unit PRBCs.   - CBC this morning with a white count of 2.4, hemoglobin of 6.3, platelet count of 37k. - Will give a dose of Lasix  20 mg IV x 1. -Saline lock IV fluids. -Folic acid  1 mg p.o. daily. - Oncology informed of admission via epic. - Continue PPI.   3.  Hypokalemia - Magnesium  at 1.9.   - Potassium repleted currently at 3.7.   - Repeat labs in AM.   4.  Multiple myeloma -Patient noted to be on carfilzomib, Revlimid, dexamethasonE . - Patient had presented to the cancer center/infusion center and sent to the ED. - Hematology/oncology informed of admission via epic.   5.  Hypertension - Continue home regimen Coreg, Cozaar .    6.  Hyperlipidemia - Continue to hold statin as patient had presented with generalized weakness. - Resume statin on discharge.   7.  Hypercalcemia of malignancy -IV fluids. - Repeat labs in the AM. - If still elevated may need to consider calcitonin and  zoledronic  acid. - Follow.   8.  Generalized weakness -Secondary to symptomatic anemia. - Status posttransfusion 2 units PRBCs.   - Continue to hold statin.   - PT/OT.      DVT prophylaxis: SCDs Code Status: Full Family Communication: Updated patient.  No family at bedside. Disposition: Likely home when clinically improved.  Status is: Inpatient The  patient will require care spanning > 2 midnights and should be moved to inpatient because: Severity of illness   Consultants:  Hematology/oncology informed of admission via epic  Procedures:  Transfuse 2 units PRBCs Chest x-ray 09/08/2024   Antimicrobials:  Anti-infectives (From admission, onward)    Start     Dose/Rate Route Frequency Ordered Stop   09/08/24 2200  acyclovir  (ZOVIRAX ) tablet 400 mg        400 mg Oral 2 times daily 09/08/24 1708     09/08/24 2200  ceFEPIme (MAXIPIME) 2 g in sodium chloride  0.9 % 100 mL IVPB        2 g 200 mL/hr over 30 Minutes Intravenous Every 8 hours 09/08/24 1731     09/08/24 1615  ceFEPIme (MAXIPIME) 2 g in sodium chloride  0.9 % 100 mL IVPB        2 g 200 mL/hr over 30 Minutes Intravenous  Once 09/08/24 1604 09/08/24 1634         Subjective: Patient sitting up in bed getting second unit of PRBCs.  Denies any chest pain.  Feels fatigue, weakness has improved since admission.  Has not ambulated yet to determine improvement with dyspnea on exertion.  Tolerating current diet.  Denies any overt bloody BMs.  Objective: Vitals:   09/09/24 0952 09/09/24 1023 09/09/24 1059 09/09/24 1333  BP: (!) 144/72 (!) 145/77 (!) 142/72 (!) 155/84  Pulse: 88 96 95 89  Resp: 16 19 18 20   Temp: 99 F (37.2 C) 99 F (37.2 C) 97.9 F (36.6 C) 98.2 F (36.8 C)  TempSrc: Oral Oral Oral Oral  SpO2: 97% 99% 100% 100%  Weight:      Height:        Intake/Output Summary (Last 24 hours) at 09/09/2024 1629 Last data filed at 09/09/2024 1413 Gross per 24 hour  Intake 932.41 ml  Output 700 ml  Net 232.41 ml   Filed Weights   09/08/24 1526 09/08/24 2315  Weight: 104.3 kg 99.7 kg    Examination:  General exam: Appears calm and comfortable  Respiratory system: CTAB.  No wheezes, no crackles, no rhonchi.  Fair air movement.  Speaking in full sentences. Cardiovascular system: S1 & S2 heard, RRR. No JVD, murmurs, rubs, gallops or clicks. No pedal  edema. Gastrointestinal system: Abdomen is nondistended, soft and nontender. No organomegaly or masses felt. Normal bowel sounds heard. Central nervous system: Alert and oriented. No focal neurological deficits. Extremities: Symmetric 5 x 5 power. Skin: No rashes, lesions or ulcers Psychiatry: Judgement and insight appear normal. Mood & affect appropriate.     Data Reviewed: I have personally reviewed following labs and imaging studies  CBC: Recent Labs  Lab 09/08/24 1337 09/08/24 1553 09/09/24 0115 09/09/24 1533  WBC 3.7* 2.9* 2.4* 2.3*  NEUTROABS 0.4* 0.4*  --  0.3*  HGB 6.9* 5.2* 6.3* 7.9*  HCT 20.6* 16.8* 20.0* 25.4*  MCV 79.2* 83.2 84.0 84.9  PLT 55* 46* 37* 39*    Basic Metabolic Panel: Recent Labs  Lab 09/08/24 1337 09/09/24 0115  NA 136 137  K 3.2* 3.7  CL 98 104  CO2 26  25  GLUCOSE 131* 133*  BUN 17 17  CREATININE 0.58 0.58  CALCIUM  11.7* 11.2*  MG  --  1.9    GFR: Estimated Creatinine Clearance: 84 mL/min (by C-G formula based on SCr of 0.58 mg/dL).  Liver Function Tests: Recent Labs  Lab 09/08/24 1337 09/09/24 0115  AST 68* 215*  ALT 14 16  ALKPHOS 187* 188*  BILITOT 0.4 0.3  PROT 8.0 6.6  ALBUMIN 3.4* 2.8*    CBG: No results for input(s): GLUCAP in the last 168 hours.   Recent Results (from the past 240 hours)  Culture, blood (Routine x 2)     Status: None (Preliminary result)   Collection Time: 09/08/24  3:45 PM   Specimen: BLOOD  Result Value Ref Range Status   Specimen Description   Final    BLOOD RIGHT ANTECUBITAL Performed at [facility], [address] [place], [place] [address]    Special Requests   Final    BOTTLES DRAWN AEROBIC AND ANAEROBIC Blood Culture results may not be optimal due to an inadequate volume of blood received in culture bottles Performed at [facility], [address], [place], [place] [address]    Culture   Final    NO GROWTH < 12 HOURS Performed at [facility], [address], [place], [place] [address]    Report Status PENDING  Incomplete  Culture, blood (Routine x 2)     Status: None (Preliminary result)   Collection Time: 09/08/24  3:49 PM   Specimen: BLOOD  Result Value Ref Range Status   Specimen Description   Final    BLOOD BLOOD RIGHT FOREARM Performed at [facility], [address], [place], [place] [address]    Special Requests   Final    BOTTLES DRAWN AEROBIC AND ANAEROBIC Blood Culture results may not be optimal due to an inadequate volume of blood received in culture bottles Performed at [facility], [address] [place], [place] [address]    Culture   Final    NO GROWTH < 12 HOURS Performed at [facility], [address], [place], [place] [address]    Report Status PENDING  Incomplete  Resp panel by RT-PCR (RSV, Flu A&B, Covid)     Status: None   Collection Time: 09/08/24  4:05 PM   Specimen: Nasal Swab  Result Value Ref Range Status   SARS Coronavirus 2 by RT PCR NEGATIVE NEGATIVE Final    Comment: (NOTE) SARS-CoV-2 target nucleic acids are NOT DETECTED.  The SARS-CoV-2 RNA is generally detectable in upper respiratory specimens during the acute phase of infection. The lowest concentration of SARS-CoV-2 viral copies this assay can detect is 138 copies/mL. A negative result does not preclude SARS-Cov-2 infection and should not be used as the sole basis for treatment or other patient management decisions. A negative result may occur with  improper specimen collection/handling, submission of specimen other than nasopharyngeal swab, presence of viral mutation(s) within the areas targeted by this assay, and inadequate number of viral copies(<138 copies/mL). A negative result must be combined with clinical observations, patient history, and epidemiological information. The expected result is Negative.  Fact Sheet for Patients:   bloggercourse.com  Fact Sheet for Healthcare Providers:  seriousbroker.it  This test is no t yet approved or cleared by the [place]  FDA and  has been authorized for detection and/or diagnosis of SARS-CoV-2 by FDA under an Emergency Use Authorization (EUA).  This EUA will remain  in effect (meaning this test can be used) for the duration of the COVID-19 declaration under Section 564(b)(1) of the Act, 21 U.S.C.section 360bbb-3(b)(1), unless the authorization is terminated  or revoked sooner.       Influenza A by PCR NEGATIVE NEGATIVE Final   Influenza B by PCR NEGATIVE NEGATIVE Final    Comment: (NOTE) The Xpert Xpress SARS-CoV-2/FLU/RSV plus assay is intended as an aid in the diagnosis of influenza from Nasopharyngeal swab specimens and should not be used as a sole basis for treatment. Nasal washings and aspirates are unacceptable for Xpert Xpress SARS-CoV-2/FLU/RSV testing.  Fact Sheet for Patients: bloggercourse.com  Fact Sheet for Healthcare Providers: seriousbroker.it  This test is not yet approved or cleared by the [place]  FDA and has been authorized for detection and/or diagnosis of SARS-CoV-2 by FDA under an Emergency Use Authorization (EUA). This EUA will remain in effect (meaning this test can be used) for the duration of the COVID-19 declaration under Section 564(b)(1) of the Act, 21 U.S.C. section 360bbb-3(b)(1), unless the authorization is terminated or revoked.     Resp Syncytial Virus by PCR NEGATIVE NEGATIVE Final    Comment: (NOTE) Fact Sheet for Patients: bloggercourse.com  Fact Sheet for Healthcare Providers: seriousbroker.it  This test is not yet approved or cleared by the [place]  FDA and has been authorized for detection and/or diagnosis of SARS-CoV-2 by FDA under an Emergency Use  Authorization (EUA). This EUA will remain in effect (meaning this test can be used) for the duration of the COVID-19 declaration under Section 564(b)(1) of the Act, 21 U.S.C. section 360bbb-3(b)(1), unless the authorization is terminated or revoked.  Performed at [facility], [address] [place], [place] [address]          Radiology Studies: DG Chest [facility] if patient is in a treatment room. Result Date: 09/08/2024 CLINICAL DATA:  Anemia, dark stools, suspected sepsis EXAM: PORTABLE CHEST 1 VIEW COMPARISON:  01/30/2024 FINDINGS: Single frontal view of the chest demonstrates stable enlargement of the cardiac silhouette. No acute airspace disease, effusion, or pneumothorax. Multiple healed bilateral rib fractures are noted. The multiple small lytic lesions seen on prior PET scan are not as well visualized by x-ray. IMPRESSION: 1. No acute intrathoracic process. 2. Chronic healed bilateral rib fractures. The numerous small lytic lesions seen on recent PET scan are not as well visualized by x-ray. Electronically Signed   By: Ozell Daring M.D.   On: 09/08/2024 16:20        Scheduled Meds:  sodium chloride    Intravenous Once   sodium chloride    Intravenous Once   acetaminophen   650 mg Oral Once   acyclovir   400 mg Oral BID   aspirin  EC  81 mg Oral Daily   carvedilol  3.125 mg Oral BID WC   diphenhydrAMINE   25 mg Oral Once   ferrous sulfate   325 mg Oral Q breakfast   folic acid   1 mg Oral Daily   furosemide   20 mg Intravenous Once   loratadine  10 mg Oral Daily   losartan   50 mg Oral Daily   pantoprazole   40 mg Oral Q0600   sodium chloride  flush  3 mL Intravenous Q12H   thiamine  100 mg Oral q morning   Continuous Infusions:  ceFEPime (MAXIPIME) IV 2 g (09/09/24 1337)     LOS: 0 days    Time spent: 40 minutes    Toribio Hummer, MD [facility]  To contact the attending provider between 7A-7P or the covering provider during after  hours 7P-7A, please log into the web site [email or web address] and access using universal [place] password for that web site. If you do not have the password, please call the hospital operator.  09/09/2024, 4:29 PM

## 2024-09-09 NOTE — Plan of Care (Signed)

## 2024-09-09 NOTE — Progress Notes (Signed)
 OT Cancellation Note  Patient Details Name: Carla Hanson MRN: [id number] DOB: April 10, 1966   Cancelled Treatment:    Reason Eval/Treat Not Completed: Patient not medically ready  Hgb 6.3 with transfusion pending  Belvie KATHEE Bud 09/09/2024, 3:02 PM

## 2024-09-09 NOTE — Plan of Care (Signed)
   Problem: Education: Goal: Knowledge of General Education information will improve Description Including pain rating scale, medication(s)/side effects and non-pharmacologic comfort measures Outcome: Progressing   Problem: Health Behavior/Discharge Planning: Goal: Ability to manage health-related needs will improve Outcome: Progressing

## 2024-09-10 ENCOUNTER — Encounter: Payer: Self-pay | Admitting: Hematology

## 2024-09-10 DIAGNOSIS — R6510 Systemic inflammatory response syndrome (SIRS) of non-infectious origin without acute organ dysfunction: Secondary | ICD-10-CM | POA: Diagnosis not present

## 2024-09-10 DIAGNOSIS — T451X5A Adverse effect of antineoplastic and immunosuppressive drugs, initial encounter: Secondary | ICD-10-CM | POA: Diagnosis not present

## 2024-09-10 DIAGNOSIS — E782 Mixed hyperlipidemia: Secondary | ICD-10-CM | POA: Diagnosis not present

## 2024-09-10 DIAGNOSIS — E876 Hypokalemia: Secondary | ICD-10-CM | POA: Diagnosis not present

## 2024-09-10 DIAGNOSIS — D509 Iron deficiency anemia, unspecified: Secondary | ICD-10-CM | POA: Diagnosis not present

## 2024-09-10 DIAGNOSIS — D649 Anemia, unspecified: Secondary | ICD-10-CM | POA: Diagnosis not present

## 2024-09-10 DIAGNOSIS — E538 Deficiency of other specified B group vitamins: Secondary | ICD-10-CM | POA: Diagnosis not present

## 2024-09-10 DIAGNOSIS — D61818 Other pancytopenia: Secondary | ICD-10-CM | POA: Diagnosis not present

## 2024-09-10 DIAGNOSIS — I10 Essential (primary) hypertension: Secondary | ICD-10-CM | POA: Diagnosis not present

## 2024-09-10 DIAGNOSIS — C9000 Multiple myeloma not having achieved remission: Secondary | ICD-10-CM | POA: Diagnosis not present

## 2024-09-10 DIAGNOSIS — Z87898 Personal history of other specified conditions: Secondary | ICD-10-CM | POA: Diagnosis not present

## 2024-09-10 DIAGNOSIS — D701 Agranulocytosis secondary to cancer chemotherapy: Secondary | ICD-10-CM | POA: Diagnosis not present

## 2024-09-10 LAB — TYPE AND SCREEN
ABO/RH(D): A POS
Antibody Screen: POSITIVE
Unit division: 0
Unit division: 0
Unit division: 0
Unit division: 0

## 2024-09-10 LAB — BPAM RBC
Blood Product Expiration Date: 202512022359
Blood Product Expiration Date: 202512022359
Blood Product Expiration Date: 202511302359
Blood Product Unit Number: 202511302359
ISSUE DATE / TIME: 202511061803
ISSUE DATE / TIME: 202511051946
ISSUE DATE / TIME: 202511060904
PRODUCT CODE: 202511061032
PRODUCT CODE: 202512022359
Unit Type and Rh: 6200
Unit Type and Rh: 6200
Unit Type and Rh: 202511302359
Unit Type and Rh: 6200
Unit Type and Rh: 6200
Unit Type and Rh: 6200

## 2024-09-10 LAB — CBC WITH DIFFERENTIAL/PLATELET
Abs Immature Granulocytes: 0.06 K/uL (ref 0.00–0.07)
Basophils Absolute: 0 K/uL (ref 0.0–0.1)
Basophils Relative: 0 %
Eosinophils Absolute: 0 K/uL (ref 0.0–0.5)
Eosinophils Relative: 0 %
HCT: 26.8 % — ABNORMAL LOW (ref 36.0–46.0)
Hemoglobin: 8.6 g/dL — ABNORMAL LOW (ref 12.0–15.0)
Immature Granulocytes: 3 %
Lymphocytes Relative: 61 %
Lymphs Abs: 1.5 K/uL (ref 0.7–4.0)
MCH: 26.6 pg (ref 26.0–34.0)
MCHC: 32.1 g/dL (ref 30.0–36.0)
MCV: 83 fL (ref 80.0–100.0)
Monocytes Absolute: 0.5 K/uL (ref 0.1–1.0)
Monocytes Relative: 19 %
Neutro Abs: 0.4 K/uL — CL (ref 1.7–7.7)
Neutrophils Relative %: 17 %
Platelets: 40 K/uL — ABNORMAL LOW (ref 150–400)
RBC: 3.23 MIL/uL — ABNORMAL LOW (ref 3.87–5.11)
RDW: 14.7 % (ref 11.5–15.5)
Smear Review: NORMAL
WBC: 2.4 K/uL — ABNORMAL LOW (ref 4.0–10.5)
nRBC: 0 % (ref 0.0–0.2)

## 2024-09-10 LAB — URINE CULTURE

## 2024-09-10 LAB — COMPREHENSIVE METABOLIC PANEL WITH GFR
ALT: 11 U/L (ref 0–44)
AST: 96 U/L — ABNORMAL HIGH (ref 15–41)
Albumin: 2.8 g/dL — ABNORMAL LOW (ref 3.5–5.0)
Alkaline Phosphatase: 187 U/L — ABNORMAL HIGH (ref 38–126)
Anion gap: 9 (ref 5–15)
BUN: 11 mg/dL (ref 6–20)
CO2: 25 mmol/L (ref 22–32)
Calcium: 11.3 mg/dL — ABNORMAL HIGH (ref 8.9–10.3)
Chloride: 104 mmol/L (ref 98–111)
Creatinine, Ser: 0.47 mg/dL (ref 0.44–1.00)
GFR, Estimated: >60 mL/min (ref >60)
Glucose, Bld: 110 mg/dL — ABNORMAL HIGH (ref 70–99)
Potassium: 2.8 mmol/L — ABNORMAL LOW (ref 3.5–5.1)
Sodium: 138 mmol/L (ref 135–145)
Total Bilirubin: 0.4 mg/dL (ref 0.0–1.2)
Total Protein: 6.8 g/dL (ref 6.5–8.1)

## 2024-09-10 LAB — MAGNESIUM: Magnesium: 1.8 mg/dL (ref 1.7–2.4)

## 2024-09-10 MED ORDER — POTASSIUM CHLORIDE CRYS ER 20 MEQ PO TBCR
40.0000 meq | EXTENDED_RELEASE_TABLET | ORAL | Status: AC
  Administered 2024-09-10 (×3): 40 meq via ORAL
  Filled 2024-09-10 (×3): qty 2

## 2024-09-10 MED ORDER — DICLOFENAC SODIUM 1 % EX GEL
2.0000 g | Freq: Two times a day (BID) | CUTANEOUS | Status: DC
  Administered 2024-09-10 – 2024-09-12 (×2): 2 g via TOPICAL
  Filled 2024-09-10: qty 100

## 2024-09-10 MED ORDER — MAGNESIUM SULFATE 2 GM/50ML IV SOLN
2.0000 g | Freq: Once | INTRAVENOUS | Status: AC
  Administered 2024-09-10: 2 g via INTRAVENOUS
  Filled 2024-09-10: qty 50

## 2024-09-10 NOTE — Progress Notes (Addendum)
 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  Patient noted to be severely anemic with hemoglobin of 5.9 at the cancer center and sent to the ED.  Repeat CBC done with a hemoglobin of 5.2.  Patient transfused 2 units PRBCs.  Patient also noted with concerns for systemic inflammatory response syndrome.   Assessment & Plan:   Principal Problem:   SIRS (systemic inflammatory response syndrome) (HCC) Active Problems:   Pancytopenia (HCC)   Symptomatic anemia   Hypokalemia   Hyperlipidemia   Essential hypertension   Iron deficiency anemia   History of hemorrhagic cerebrovascular accident (CVA) without residual deficits   COPD, moderate (HCC)   History of seizure   Hypercalcemia of malignancy   Multiple myeloma not having achieved remission (HCC)   Chemotherapy-induced neutropenia   Folate deficiency  #1 SIRS -Patient on admission met criteria for SIRS with a low-grade temp of 100.4, noted to be tachycardic with heart rates as high as 113. - Patient with a pancytopenia. - Blood cultures obtained pending. - Chest x-ray negative for any acute infiltrate. - Urinalysis cloudy, nitrite negative, leukocytes negative, many bacteria, 6-10 WBCs.  -Urine cultures with multiple species present - Continue empiric IV cefepime day 3/3 and discontinue cefepime after today's doses.  - Supportive care.   2.  Symptomatic anemia/pancytopenia/folate deficiency -Patient  presenting with 1 day history of generalized weakness, dyspnea on exertion similar to episodes of anemia in the past per patient. -Likely secondary to multiple myeloma in the setting of recent chemotherapy. - Patient denies any overt bleeding. - Patient noted to be on oral iron supplementation at home. - Anemia panel with iron level of 110, ferritin > 7500, folate of 3.6, vitamin B12 of 544.  - FOBT pending - Status post transfusion 3 units PRBCs with hemoglobin currently at 8.6 from a low of 5.2 on admission.  -CBC with a white count of 2.4, hemoglobin of 8.6, platelet count of 40K with a ANC of 0.4. -Continue folic acid  1 mg daily. - Oncology informed of admission via epic. - Continue PPI.   3.  Hypokalemia -Potassium at 2.8 this morning. -Magnesium  at 1.8. -K. Dur 40 mEq p.o. every 4 hours x 3 doses. -Magnesium  sulfate 2 g IV x 1. - Repeat labs in AM.   4.  Multiple myeloma -Patient noted to be on carfilzomib, Revlimid, dexamethasonE . - Patient had presented to the cancer center/infusion center and sent to the ED. - Hematology/oncology informed of admission via epic.   5.  Hypertension - Continue Cozaar , Coreg.     6.  Hyperlipidemia - Continue to hold statin as patient had presented with generalized weakness. - Resume statin on discharge.   7.  Hypercalcemia of malignancy -IV fluids. - Repeat labs in the AM. -Per oncology patient receiving Zometa  every 28 days with last dose of Zometa  08/25/2024. -Per oncology.   8.  Generalized weakness -Secondary to symptomatic anemia. - Status posttransfusion 3 units PRBCs.   - Continue to hold statin.   -  PT/OT.      DVT prophylaxis: SCDs Code Status: Full Family Communication: Updated patient.  No family at bedside. Disposition: Likely home when clinically improved.  Status is: Inpatient The patient will require care spanning > 2 midnights and should be moved to inpatient because: Severity of illness   Consultants:   Hematology/oncology informed of admission via epic  Procedures:  Transfuse 3 units PRBCs Chest x-ray 09/08/2024   Antimicrobials:  Anti-infectives (From admission, onward)    Start     Dose/Rate Route Frequency Ordered Stop   09/08/24 2200  acyclovir  (ZOVIRAX ) tablet 400 mg        400 mg Oral 2 times daily 09/08/24 1708     09/08/24 2200  ceFEPIme (MAXIPIME) 2 g in sodium chloride  0.9 % 100 mL IVPB        2 g 200 mL/hr over 30 Minutes Intravenous Every 8 hours 09/08/24 1731 09/10/24 2359   09/08/24 1615  ceFEPIme (MAXIPIME) 2 g in sodium chloride  0.9 % 100 mL IVPB        2 g 200 mL/hr over 30 Minutes Intravenous  Once 09/08/24 1604 09/08/24 1634         Subjective: Patient sitting up at the side of the bed physical therapy in the room.  Patient denies any chest pain or shortness of breath.  Almost all feeling much better than she did on admission.  Some improvement with weakness and fatigue.  Complaining of bilateral knee pain and hesitant to ambulate with therapy today.   Objective: Vitals:   09/09/24 2022 09/10/24 0419 09/10/24 0813 09/10/24 1341  BP: (!) 155/76 (!) 160/87 (!) 140/73 128/68  Pulse: 89 88 85 89  Resp: 14 14    Temp: 98.8 F (37.1 C) 98.7 F (37.1 C)  97.8 F (36.6 C)  TempSrc: Oral Oral    SpO2: 98% 98%  97%  Weight:      Height:        Intake/Output Summary (Last 24 hours) at 09/10/2024 1756 Last data filed at 09/10/2024 1400 Gross per 24 hour  Intake 1145.67 ml  Output 2100 ml  Net -954.33 ml   Filed Weights   09/08/24 1526 09/08/24 2315  Weight: 104.3 kg 99.7 kg    Examination:  General exam: NAD Respiratory system: CTAB.  No wheezes, no crackles, no rhonchi.  Fair air movement.  Speaking in full sentences.  Cardiovascular system: Regular rate and rhythm no murmurs rubs or gallops.  No JVD.  No lower extremity edema.  Gastrointestinal system: Abdomen is soft, nontender, nondistended, positive bowel sounds.  No rebound.  No guarding.   Central nervous system: Alert and oriented. No focal neurological deficits. Extremities: Symmetric 5 x 5 power. Skin: No rashes, lesions or ulcers Psychiatry: Judgement and insight appear normal. Mood & affect appropriate.     Data Reviewed: I have personally reviewed following labs and imaging studies  CBC: Recent Labs  Lab 09/08/24 1337 09/08/24 1553 09/09/24 0115 09/09/24 1533 09/09/24 2219 09/10/24 0550  WBC 3.7* 2.9* 2.4* 2.3*  --  2.4*  NEUTROABS 0.4* 0.4*  --  0.3*  --  0.4*  HGB 6.9* 5.2* 6.3* 7.9* 8.4* 8.6*  HCT 20.6* 16.8* 20.0* 25.4* 26.0* 26.8*  MCV 79.2* 83.2 84.0 84.9  --  83.0  PLT 55* 46* 37* 39*  --  40*    Basic Metabolic Panel: Recent Labs  Lab 09/08/24 1337 09/09/24 0115 09/10/24 0550  NA 136 137 138  K 3.2* 3.7 2.8*  CL  98 104 104  CO2 26 25 25   GLUCOSE 131* 133* 110*  BUN 17 17 11   CREATININE 0.58 0.58 0.47  CALCIUM  11.7* 11.2* 11.3*  MG  --  1.9 1.8    GFR: Estimated Creatinine Clearance: 84 mL/min (by C-G formula based on SCr of 0.47 mg/dL).  Liver Function Tests: Recent Labs  Lab 09/08/24 1337 09/09/24 0115 09/10/24 0550  AST 68* 215* 96*  ALT 14 16 11   ALKPHOS 187* 188* 187*  BILITOT 0.4 0.3 0.4  PROT 8.0 6.6 6.8  ALBUMIN 3.4* 2.8* 2.8*    CBG: No results for input(s): GLUCAP in the last 168 hours.   Recent Results (from the past 240 hours)  Culture, blood (Routine x 2)     Status: None (Preliminary result)   Collection Time: 09/08/24  3:45 PM   Specimen: BLOOD  Result Value Ref Range Status   Specimen Description   Final    BLOOD RIGHT ANTECUBITAL Performed at [facility], [address], [place], [place] [address]    Special Requests   Final    BOTTLES DRAWN AEROBIC AND ANAEROBIC Blood Culture results may not be optimal due to an inadequate volume of blood received in culture bottles Performed at [facility], [address] [place], [place] [address]    Culture   Final     NO GROWTH 2 DAYS Performed at [facility], [address] [place], [place] [address]    Report Status PENDING  Incomplete  Culture, blood (Routine x 2)     Status: None (Preliminary result)   Collection Time: 09/08/24  3:49 PM   Specimen: BLOOD  Result Value Ref Range Status   Specimen Description   Final    BLOOD BLOOD RIGHT FOREARM Performed at [facility], [address] [place], [place] [address]    Special Requests   Final    BOTTLES DRAWN AEROBIC AND ANAEROBIC Blood Culture results may not be optimal due to an inadequate volume of blood received in culture bottles Performed at [facility], [address] [place], [place] [address]    Culture   Final    NO GROWTH 2 DAYS Performed at [facility], [address] [place], [place] [address]    Report Status PENDING  Incomplete  Resp panel by RT-PCR (RSV, Flu A&B, Covid)     Status: None   Collection Time: 09/08/24  4:05 PM   Specimen: Nasal Swab  Result Value Ref Range Status   SARS Coronavirus 2 by RT PCR NEGATIVE NEGATIVE Final    Comment: (NOTE) SARS-CoV-2 target nucleic acids are NOT DETECTED.  The SARS-CoV-2 RNA is generally detectable in upper respiratory specimens during the acute phase of infection. The lowest concentration of SARS-CoV-2 viral copies this assay can detect is 138 copies/mL. A negative result does not preclude SARS-Cov-2 infection and should not be used as the sole basis for treatment or other patient management decisions. A negative result may occur with  improper specimen collection/handling, submission of specimen other than nasopharyngeal swab, presence of viral mutation(s) within the areas targeted by this assay, and inadequate number of viral copies(<138 copies/mL). A negative result must be combined with clinical observations, patient history, and epidemiological information. The expected result is Negative.  Fact Sheet for Patients:   bloggercourse.com  Fact Sheet for Healthcare Providers:  seriousbroker.it  This test is no t yet approved or cleared by the [place]  FDA and  has been authorized for detection and/or diagnosis of SARS-CoV-2 by FDA under an Emergency Use Authorization (EUA). This EUA will remain  in effect (meaning this test can be used) for the duration of the COVID-19 declaration under Section 564(b)(1) of the Act, 21 U.S.C.section 360bbb-3(b)(1), unless the authorization is terminated  or revoked sooner.       Influenza A by PCR NEGATIVE NEGATIVE Final   Influenza B by PCR NEGATIVE NEGATIVE Final    Comment: (NOTE) The Xpert Xpress SARS-CoV-2/FLU/RSV plus assay is intended as an aid in the diagnosis of influenza from Nasopharyngeal swab specimens and should not be used as a sole basis for treatment. Nasal washings and aspirates are unacceptable for Xpert Xpress SARS-CoV-2/FLU/RSV testing.  Fact Sheet for Patients: bloggercourse.com  Fact Sheet for Healthcare Providers: seriousbroker.it  This test is not yet approved or cleared by the [place]  FDA and has been authorized for detection and/or diagnosis of SARS-CoV-2 by FDA under an Emergency Use Authorization (EUA). This EUA will remain in effect (meaning this test can be used) for the duration of the COVID-19 declaration under Section 564(b)(1) of the Act, 21 U.S.C. section 360bbb-3(b)(1), unless the authorization is terminated or revoked.     Resp Syncytial Virus by PCR NEGATIVE NEGATIVE Final    Comment: (NOTE) Fact Sheet for Patients: bloggercourse.com  Fact Sheet for Healthcare Providers: seriousbroker.it  This test is not yet approved or cleared by the [place]  FDA and has been authorized for detection and/or diagnosis of SARS-CoV-2 by FDA under an Emergency Use  Authorization (EUA). This EUA will remain in effect (meaning this test can be used) for the duration of the COVID-19 declaration under Section 564(b)(1) of the Act, 21 U.S.C. section 360bbb-3(b)(1), unless the authorization is terminated or revoked.  Performed at [facility], [address] [place], [place] [address]   Urine Culture (for pregnant, neutropenic or urologic patients or patients with an indwelling urinary catheter)     Status: Abnormal   Collection Time: 09/08/24  5:28 PM   Specimen: Urine, Clean Catch  Result Value Ref Range Status   Specimen Description   Final    URINE, CLEAN CATCH Performed at [facility], [address] [place], [place] [address]    Special Requests   Final    NONE Performed at [facility], [address] [place], [place] [address]    Culture MULTIPLE SPECIES PRESENT, SUGGEST RECOLLECTION (A)  Final   Report Status 09/10/2024 FINAL  Final         Radiology Studies: No results found.       Scheduled Meds:  sodium chloride    Intravenous Once   acyclovir   400 mg Oral BID   aspirin  EC  81 mg Oral Daily   carvedilol  3.125 mg Oral BID WC   diclofenac  Sodium  2 g Topical BID   feeding supplement  237 mL Oral BID BM   ferrous sulfate   325 mg Oral Q breakfast   folic acid   1 mg Oral Daily   loratadine  10 mg Oral Daily   losartan   50 mg Oral Daily   pantoprazole   40 mg Oral Q0600   sodium chloride  flush  3 mL Intravenous Q12H   thiamine  100 mg Oral q morning   Continuous Infusions:  ceFEPime (MAXIPIME) IV 2 g (09/10/24 1417)     LOS: 1 day    Time spent: 40 minutes    Toribio Hummer, MD [facility]  To contact the attending provider between 7A-7P or the covering provider during after hours 7P-7A, please log into the web site [email or web address] and access using universal Maribel password for that web site. If you do not have the password, please call the  hospital operator.  09/10/2024, 5:56 PM

## 2024-09-10 NOTE — Progress Notes (Signed)
 Carla Hanson   DOB:03-14-1966   FM#:[id number]      ASSESSMENT & PLAN:  Carla Hanson is a 58 year old female patient with oncologic history significant for multiple myeloma.  Patient was admitted 11/5 due to complaints of progressive weakness and DOE.  Medical Oncology following.  IgG Lambda Multiple Myeloma, refractory -- Bone marrow bx done 02/05/24 showed hypercellular bone marrow 60% involved by plasma cell neoplasm.   -- Started on chemotherapy regimen DaraVRd (Daratumumab  subcu + bortezomib  subcu) every 21 days.  However reduced bortezomib  dosage due to symptoms of tingling, restlessness, and muscle cramps legs. Also increase of M-protein. -- Transition to chemotherapy regimen Cyclophosphamide + Kyprolis and dexamethasone  planned 11/5, not started at this time.  -- Patient needs to call and schedule her next PET/CT upon discharge --Patient previously stated no consideration for bone marrow transplant for consolidation.    -- Medical Oncology/Dr. Onesimo following and will make further recommendations.  Pancytopenia -- Likely multifactorial due to ?GI bleed and recent oncologic treatments -- FOBT pending Anemia -- Hemoglobin 8.6 -- Status post PRBC transfusion, given 11/6. -- HGB 5.2 on admission -- Continue folic acid  and ferrous sulfate  -- Recommend PRBC transfusion for HGB<7.0 Leukopenia/Neutropenia -- WBC 2.4 with ANC 0.4 -- No intervention at this time Thrombocytopenia -- Platelets low 40K -- Recommend platelet transfusion for counts <20K or <50K with bleeding -- Continue to monitor CBC with differential.  Neutropenic Fever -- Low WBC and ANC.  -- Temp slightly elevated 99 within last 24 hours. -- Continue antibiotics.   -- Continue to monitor fever curve.   Generalized weakness -- Likely due to low blood counts, recent oncologic therapy -- Continue supportive care  Hypercalcemia (HCOM) -- Calcium  11.7 on admission. 11.3 today -- Continue vitamin B1/Thiamine 100  mg po daily.  -- Received Zometa  every 28 days, last received 08/25/24.  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  Reports that she just had physical therapy and her knees are hurting at this time. Otherwise, no complaints offered.   Objective:   Intake/Output Summary (Last 24 hours) at 09/10/2024 1209 Last data filed at 09/10/2024 0850 Gross per 24 hour  Intake 1610.17 ml  Output 2400 ml  Net -789.83 ml     PHYSICAL EXAMINATION: ECOG PERFORMANCE STATUS: 3 - Symptomatic, >50% confined to bed  Vitals:   09/10/24 0419 09/10/24 0813  BP: (!) 160/87 (!) 140/73  Pulse: 88 85  Resp: 14   Temp: 98.7 F (37.1 C)   SpO2: 98%    Filed Weights   09/08/24 1526 09/08/24 2315  Weight: 230 lb (104.3 kg) 219 lb 14.4 oz (99.7 kg)    GENERAL: alert, no distress +fatigue  +chronically ill-appearing SKIN: skin color, texture, turgor are normal, no rashes or significant lesions EYES: normal, conjunctiva are pink and non-injected, sclera clear OROPHARYNX: no exudate, no erythema and lips, buccal mucosa, and tongue normal  NECK: supple, thyroid  normal size, non-tender, without nodularity LYMPH: no palpable lymphadenopathy in the cervical, axillary or inguinal LUNGS: clear to auscultation and percussion with normal breathing effort HEART: regular rate & rhythm and no murmurs and no lower extremity edema ABDOMEN: abdomen soft, non-tender and normal bowel sounds MUSCULOSKELETAL: no cyanosis of digits and no clubbing  PSYCH:  alert & oriented x 3 with fluent speech NEURO: no focal motor/sensory deficits   All questions were answered. The patient knows to call the clinic with any problems, questions or concerns.   The total time spent in the appointment was 40 minutes encounter with patient  including review of chart and various tests results, discussions about plan of care and coordination of care plan  Olam JINNY Brunner, NP 09/10/2024 12:09 PM    Labs Reviewed:  Lab Results  Component Value Date   WBC 2.4 (L) 09/10/2024   HGB 8.6 (L) 09/10/2024   HCT 26.8 (L) 09/10/2024   MCV 83.0 09/10/2024   PLT 40 (L) 09/10/2024   Recent Labs    09/08/24 1337 09/09/24 0115 09/10/24 0550  NA 136 137 138  K 3.2* 3.7 2.8*  CL 98 104 104  CO2 26 25 25   GLUCOSE 131* 133* 110*  BUN 17 17 11   CREATININE 0.58 0.58 0.47  CALCIUM  11.7* 11.2* 11.3*  GFRNONAA >60 >60 >60  PROT 8.0 6.6 6.8  ALBUMIN 3.4* 2.8* 2.8*  AST 68* 215* 96*  ALT 14 16 11   ALKPHOS 187* 188* 187*  BILITOT 0.4 0.3 0.4    Studies Reviewed:  DG Chest Port 1 View if patient is in a treatment room. Result Date: 09/08/2024 CLINICAL DATA:  Anemia, dark stools, suspected sepsis EXAM: PORTABLE CHEST 1 VIEW COMPARISON:  01/30/2024 FINDINGS: Single frontal view of the chest demonstrates stable enlargement of the cardiac silhouette. No acute airspace disease, effusion, or pneumothorax. Multiple healed bilateral rib fractures are noted. The multiple small lytic lesions seen on prior PET scan are not as well visualized by x-ray. IMPRESSION: 1. No acute intrathoracic process. 2. Chronic healed bilateral rib fractures. The numerous small lytic lesions seen on recent PET scan are not as well visualized by x-ray. Electronically Signed   By: Ozell Daring M.D.   On: 09/08/2024 16:20

## 2024-09-10 NOTE — Evaluation (Signed)
 Occupational Therapy Evaluation Patient Details Name: Carla Hanson MRN: [id number] DOB: [birth year] Today's Date: 09/10/2024   History of Present Illness   Patient is a 58 year old female who presetned to the ED via cancer center with c/o generalized weakness, DOE, and diarrhea.  Hgb 5.2 in ED, given total of 2 units PRBC.  Dx with SIRS, anemia / pancytopenia, & hypokalemia.  PMHx includes HTN, HLD, LLE DVT, CVA w/o residual deficits, bronchitis, PNA, DOE, COPD (Hx of smoking), Hx of PE, cystectomy, multiple myeloma (currently under Tx), OA, right ankle Fx (ORIF, 2013), anxiety, bipolar, Hx of ETOH, current cannabis use     Clinical Impressions PTA, patient was living independently in ground level apartment and receiving treatment at the cancer center.  Onset of SIRS has contributed to a decrease patient's independence with self-care.  Patient is also dealing with chronic pain in bilateral knees.  Patient will benefit from acute OT services for training in pain management and compensatory ADL and energy conservation strategies.  Patient will benefit from home health OT to continue progression after discharge.  Acute OT will continue to follow patient while in the hospital to address performance deficits described herein.  Thank you for allowing us  to participate in the care of this patient.    If plan is discharge home, recommend the following:   A lot of help with bathing/dressing/bathroom;A lot of help with walking and/or transfers;Assistance with cooking/housework;Assist for transportation;Help with stairs or ramp for entrance     Functional Status Assessment   Patient has had a recent decline in their functional status and demonstrates the ability to make significant improvements in function in a reasonable and predictable amount of time.     Equipment Recommendations   None recommended by OT      Precautions/Restrictions   Precautions Precautions:  Fall Restrictions Weight Bearing Restrictions Per Provider Order: No     Mobility Bed Mobility Overal bed mobility: Needs Assistance Bed Mobility: Supine to Sit Supine to sit: HOB elevated, Min assist, Used rails    Transfers Overall transfer level: Needs assistance Equipment used: Rolling walker (2 wheels) Transfers: Sit to/from Stand Sit to Stand: Min assist General transfer comment: Cues for hand placement      Balance Overall balance assessment: Needs assistance Sitting-balance support: No upper extremity supported, Feet supported Sitting balance-Leahy Scale: Good   Standing balance support: Reliant on assistive device for balance, Bilateral upper extremity supported, During functional activity Standing balance-Leahy Scale: Poor     ADL either performed or assessed with clinical judgement   ADL Overall ADL's : Needs assistance/impaired Eating/Feeding: Modified independent;Sitting   Grooming: Modified independent;Sitting   Upper Body Bathing: Minimal assistance;Sitting   Lower Body Bathing: Moderate assistance;Sitting/lateral leans   Upper Body Dressing : Minimal assistance;Sitting   Lower Body Dressing: Supervision/safety;With adaptive equipment;Sitting/lateral leans Lower Body Dressing Details (indicate cue type and reason): Demonstrated use of sock aid  Toilet Transfer: Contact guard assist;Rolling walker (2 wheels);Regular Toilet;Grab bars   Toileting- Clothing Manipulation and Hygiene: Moderate assistance;Sit to/from stand   Functional mobility during ADLs: Contact guard assist;Rolling walker (2 wheels) General ADL Comments: Bilateral knee pain impacts LB ADLs     Vision Baseline Vision/History: 1 Wears glasses Ability to See in Adequate Light: 0 Adequate Patient Visual Report: No change from baseline Vision Assessment?: No apparent visual deficits;Wears glasses for reading            Pertinent Vitals/Pain Pain Assessment Pain Assessment: 0-10  (0/10 at rest, 5/10 s/p  application of heat packs) Pain Score: 8  Pain Location: Bilateral knees Pain Descriptors / Indicators: Aching, Sharp Pain Intervention(s): Monitored during session, Limited activity within patient's tolerance, Repositioned, Heat applied     Extremity/Trunk Assessment Upper Extremity Assessment Upper Extremity Assessment: Generalized weakness;RUE deficits/detail;LUE deficits/detail;Right hand dominant RUE Deficits / Details: Shoulder flexion AROM 110 deg; gross strength 4-/5 RUE Sensation: WNL RUE Coordination: WNL LUE Deficits / Details: Shoulder flexion AROM 110 deg; gross strength 4-/5 LUE Sensation: WNL LUE Coordination: WNL   Lower Extremity Assessment Lower Extremity Assessment: Defer to PT evaluation   Cervical / Trunk Assessment Cervical / Trunk Assessment: Normal   Communication Communication Communication: No apparent difficulties   Cognition Arousal: Alert Behavior During Therapy: WFL for tasks assessed/performed Cognition: No apparent impairments   Following commands: Intact       General Comments   Patient very concerned about bilateral knee pain; advised likely due to OA (patient has not seen ortho)           Home Living Family/patient expects to be discharged to:: Private residence Living Arrangements: Parent (Mother) Available Help at Discharge: Family;Available PRN/intermittently Type of Home: Apartment Home Access: Level entry Home Layout: One level Bathroom Shower/Tub: Engineer, Manufacturing Systems: Standard Home Equipment: Rollator (4 wheels);Tub bench;Grab bars - tub/shower;Hand held shower head;BSC/3in1      Prior Functioning/Environment Prior Level of Function : Independent/Modified Independent      OT Problem List: Decreased strength;Decreased range of motion;Decreased activity tolerance;Impaired balance (sitting and/or standing);Pain   OT Treatment/Interventions: Self-care/ADL training;Therapeutic  exercise;Energy conservation;DME and/or AE instruction;Therapeutic activities;Patient/family education      OT Goals(Current goals can be found in the care plan section)   Acute Rehab OT Goals Patient Stated Goal: Manage pain; feel better; return home OT Goal Formulation: With patient Time For Goal Achievement: 09/24/24 Potential to Achieve Goals: Good ADL Goals Pt Will Perform Grooming: with modified independence;standing (while demonstrating 10 minutes standing activity tolerance) Pt Will Perform Lower Body Dressing: with modified independence;with adaptive equipment;sit to/from stand Pt Will Transfer to Toilet: with modified independence;ambulating;grab bars Pt Will Perform Toileting - Clothing Manipulation and hygiene: with modified independence;sit to/from stand;with adaptive equipment Additional ADL Goal #1: Patient will verbalize and/or demonstrate 3 strategies for managing pain   OT Frequency:  Min 2X/week       AM-PAC OT 6 Clicks Daily Activity     Outcome Measure Help from another person eating meals?: None Help from another person taking care of personal grooming?: None Help from another person toileting, which includes using toliet, bedpan, or urinal?: A Lot Help from another person bathing (including washing, rinsing, drying)?: A Lot Help from another person to put on and taking off regular upper body clothing?: A Little Help from another person to put on and taking off regular lower body clothing?: A Little 6 Click Score: 18   End of Session Equipment Utilized During Treatment: Gait belt;Rolling walker (2 wheels) Nurse Communication: Mobility status;Other (comment) (Positive response to heat for pain)  Activity Tolerance: Patient tolerated treatment well Patient left: in chair;with call bell/phone within reach;with chair alarm set  OT Visit Diagnosis: Unsteadiness on feet (R26.81);Muscle weakness (generalized) (M62.81);Pain Pain - part of body: Knee (Bilateral)                 Time: 8974-8885 OT Time Calculation (min): 49 min Charges:  OT General Charges $OT Visit: 1 Visit OT Evaluation $OT Eval Moderate Complexity: 1 Mod OT Treatments $Self Care/Home Management : 8-22 mins $Therapeutic  Activity: 8-22 mins  Belvie B. Kersti Scavone, MS, OTR/L 09/10/2024, 5:34 PM

## 2024-09-10 NOTE — TOC Initial Note (Addendum)
 Transition of Care [facility]) - Initial/Assessment Note    Patient Details  Name: Carla Hanson MRN: [id number] Date of Birth: Mar 11, 1966  [facility]) CM/SW Contact:    Toy LITTIE Agar, RN Phone [phone]  09/10/2024, 2:46 PM  Clinical Narrative:                 Inpatient care manager referral for Home health services. CM at bedside to offer choice. Patient states that she does not have a preference. CM has explained that referral has been faxed out via hub with no current offers. Patient is willing to do outpatient therapy which she states that she has done many times in the past.   Currently no agency to accept for [facility] services.  [facility])  Pending - Request Sent -- [address], [place] [address] [phone] --  [facility])  Pending - Request Sent -- [address] [place], [place] [address] [phone] --  [facility])  Pending - Request Sent -- [address], [place] [address] [phone] -- --  [facility])  Declined Facility declined (no reason given) -- [address], [place] [address] [phone] --  [facility])  Declined Payor at capacity -- [address] [place] [address] [phone] --  [facility])  Declined Out of network -- [address] [place], [place] [address] [phone] --  [facility])  Declined Insurance not in network -- [address], [place] [address] [phone] --  [facility])  Declined Out of network -- [address], [place] [address] [phone] --    Expected Discharge Plan: Home w Home Health Services Barriers to Discharge: Continued Medical Work up   Patient  Goals and CMS Choice Patient states their goals for this hospitalization and ongoing recovery are:: Wants to be able to go home CMS Medicare.gov Compare Post Acute Care list provided to:: Patient Choice offered to / list presented to : Patient [place] ownership interest in [facility].provided to::  (n/a)    Expected Discharge Plan and Services In-house Referral: NA Discharge Planning Services: CM Consult Post Acute Care Choice: NA Living arrangements for the past 2 months: Apartment                 DME Arranged: N/A DME Agency: NA       HH Arranged: NA          Prior Living Arrangements/Services Living arrangements for the past 2 months: Apartment Lives with:: Parents (mother is currently living with patient) Patient language and need for interpreter reviewed:: Yes Do you feel safe going back to the place where you live?: Yes      Need for Family Participation in Patient Care: Yes (Comment) Care giver support system in place?: Yes (comment) Current home services:  (n/a) Criminal Activity/Legal Involvement Pertinent to Current Situation/Hospitalization: No - Comment as needed  Activities of Daily Living   ADL Screening (condition at time of admission) Independently performs ADLs?: Yes (appropriate for developmental age) Is the patient deaf or have difficulty hearing?: No Does the patient have difficulty seeing, even when wearing glasses/contacts?: No Does the patient have difficulty concentrating, remembering, or making  decisions?: No  Permission Sought/Granted Permission sought to share information with : Family Supports Permission granted to share information with : Yes, Verbal Permission Granted  Share Information with NAME: Zettie Lee   [phone]     Permission granted to share info w Relationship: mother  Permission granted to share info w Contact Information: [phone]  Emotional Assessment Appearance:: Appears stated  age Attitude/Demeanor/Rapport: Gracious Affect (typically observed): Accepting Orientation: : Oriented to Self, Oriented to Place, Oriented to  Time, Oriented to Situation Alcohol / Substance Use: Not Applicable Psych Involvement: No (comment)  Admission diagnosis:  Chemotherapy-induced neutropenia [D70.1, T45.1X5A] SIRS (systemic inflammatory response syndrome) (HCC) [R65.10] Symptomatic anemia [D64.9] Patient Active Problem List   Diagnosis Date Noted   Chemotherapy-induced neutropenia 09/09/2024   Folate deficiency 09/09/2024   SIRS (systemic inflammatory response syndrome) (HCC) 09/08/2024   Pancytopenia (HCC) 09/08/2024   Symptomatic anemia 09/08/2024   History of compression fracture of spine 04/12/2024   Electrolyte abnormality 02/10/2024   Edema leg 02/10/2024   Hypercalcemia of malignancy 02/02/2024   Multiple myeloma not having achieved remission (HCC) 02/02/2024   Hypokalemia 01/30/2024   BMI 40.0-44.9, adult (HCC) 12/18/2022   Chronic pain of both shoulders 09/05/2022   Right knee pain 07/18/2022   Vitamin D  deficiency 12/19/2020   Palpitation 12/03/2019   History of intracranial hemorrhage 08/02/2019   History of DVT of lower extremity 08/02/2019   History of seizure 08/02/2019   COPD, moderate (HCC) 12/11/2018   History of hemorrhagic cerebrovascular accident (CVA) without residual deficits 08/13/2017   Iron deficiency anemia 12/12/2016   Pre-diabetes 03/02/2013   History of pulmonary embolism 05/05/2012   Former tobacco use 08/23/2009   Hyperlipidemia 04/30/2007   Essential hypertension 01/01/2007   PCP:  Anders Otto DASEN, MD Pharmacy:   [facility] [address] [place] [address] Phone: [phone] Fax: [phone]     Social Drivers of Health (SDOH) Social History: SDOH Screenings   Food Insecurity: No Food Insecurity (09/08/2024)  Housing: Low Risk  (09/08/2024)  Transportation Needs: No Transportation  Needs (09/08/2024)  Utilities: Not At Risk (09/08/2024)  Depression (PHQ2-9): Low Risk  (09/08/2024)  Financial Resource Strain: Low Risk  (05/14/2024)  Physical Activity: Insufficiently Active (05/14/2024)  Social Connections: Moderately Integrated (05/14/2024)  Stress: No Stress Concern Present (05/14/2024)  Tobacco Use: Medium Risk (09/08/2024)   SDOH Interventions:     Readmission Risk Interventions    02/03/2024    1:14 PM  Readmission Risk Prevention Plan  Transportation Screening Complete  PCP or Specialist Appt within 3-5 Days Complete  Social Work Consult for Recovery Care Planning/Counseling Complete  Palliative Care Screening Not Applicable  Medication Review Oceanographer) Complete

## 2024-09-11 DIAGNOSIS — I10 Essential (primary) hypertension: Secondary | ICD-10-CM | POA: Diagnosis not present

## 2024-09-11 DIAGNOSIS — D61818 Other pancytopenia: Secondary | ICD-10-CM | POA: Diagnosis not present

## 2024-09-11 DIAGNOSIS — E876 Hypokalemia: Secondary | ICD-10-CM | POA: Diagnosis not present

## 2024-09-11 DIAGNOSIS — D701 Agranulocytosis secondary to cancer chemotherapy: Secondary | ICD-10-CM | POA: Diagnosis not present

## 2024-09-11 DIAGNOSIS — D649 Anemia, unspecified: Secondary | ICD-10-CM | POA: Diagnosis not present

## 2024-09-11 DIAGNOSIS — R6510 Systemic inflammatory response syndrome (SIRS) of non-infectious origin without acute organ dysfunction: Secondary | ICD-10-CM | POA: Diagnosis not present

## 2024-09-11 DIAGNOSIS — E538 Deficiency of other specified B group vitamins: Secondary | ICD-10-CM | POA: Diagnosis not present

## 2024-09-11 DIAGNOSIS — C9000 Multiple myeloma not having achieved remission: Secondary | ICD-10-CM | POA: Diagnosis not present

## 2024-09-11 DIAGNOSIS — E782 Mixed hyperlipidemia: Secondary | ICD-10-CM | POA: Diagnosis not present

## 2024-09-11 DIAGNOSIS — Z87898 Personal history of other specified conditions: Secondary | ICD-10-CM | POA: Diagnosis not present

## 2024-09-11 DIAGNOSIS — D509 Iron deficiency anemia, unspecified: Secondary | ICD-10-CM | POA: Diagnosis not present

## 2024-09-11 LAB — CBC WITH DIFFERENTIAL/PLATELET
Abs Immature Granulocytes: 0.05 K/uL (ref 0.00–0.07)
Basophils Absolute: 0 K/uL (ref 0.0–0.1)
Basophils Relative: 0 %
Eosinophils Absolute: 0 K/uL (ref 0.0–0.5)
Eosinophils Relative: 0 %
HCT: 25.1 % — ABNORMAL LOW (ref 36.0–46.0)
Hemoglobin: 8.1 g/dL — ABNORMAL LOW (ref 12.0–15.0)
Immature Granulocytes: 2 %
Lymphocytes Relative: 62 %
Lymphs Abs: 1.4 K/uL (ref 0.7–4.0)
MCH: 27 pg (ref 26.0–34.0)
MCHC: 32.3 g/dL (ref 30.0–36.0)
MCV: 83.7 fL (ref 80.0–100.0)
Monocytes Absolute: 0.4 K/uL (ref 0.1–1.0)
Monocytes Relative: 20 %
Neutro Abs: 0.4 K/uL — CL (ref 1.7–7.7)
Neutrophils Relative %: 16 %
Platelets: 37 K/uL — ABNORMAL LOW (ref 150–400)
RBC: 3 MIL/uL — ABNORMAL LOW (ref 3.87–5.11)
RDW: 14.9 % (ref 11.5–15.5)
WBC: 2.3 K/uL — ABNORMAL LOW (ref 4.0–10.5)
nRBC: 0 % (ref 0.0–0.2)

## 2024-09-11 LAB — BASIC METABOLIC PANEL WITH GFR
Anion gap: 8 (ref 5–15)
BUN: 13 mg/dL (ref 6–20)
CO2: 25 mmol/L (ref 22–32)
Calcium: 11.4 mg/dL — ABNORMAL HIGH (ref 8.9–10.3)
Chloride: 106 mmol/L (ref 98–111)
Creatinine, Ser: 0.44 mg/dL (ref 0.44–1.00)
GFR, Estimated: >60 mL/min (ref >60)
Glucose, Bld: 116 mg/dL — ABNORMAL HIGH (ref 70–99)
Potassium: 3.8 mmol/L (ref 3.5–5.1)
Sodium: 139 mmol/L (ref 135–145)

## 2024-09-11 LAB — MAGNESIUM: Magnesium: 2.2 mg/dL (ref 1.7–2.4)

## 2024-09-11 LAB — OCCULT BLOOD X 1 CARD TO LAB, STOOL: Fecal Occult Bld: NEGATIVE

## 2024-09-11 NOTE — Progress Notes (Signed)
 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  Patient noted to be severely anemic with hemoglobin of 5.9 at the cancer center and sent to the ED.  Repeat CBC done with a hemoglobin of 5.2.  Patient transfused 2 units PRBCs.  Patient also noted with concerns for systemic inflammatory response syndrome.   Assessment & Plan:   Principal Problem:   SIRS (systemic inflammatory response syndrome) (HCC) Active Problems:   Pancytopenia (HCC)   Symptomatic anemia   Hypokalemia   Hyperlipidemia   Essential hypertension   Iron deficiency anemia   History of hemorrhagic cerebrovascular accident (CVA) without residual deficits   COPD, moderate (HCC)   History of seizure   Hypercalcemia of malignancy   Multiple myeloma not having achieved remission (HCC)   Chemotherapy-induced neutropenia   Folate deficiency  #1 SIRS -Patient on admission met criteria for SIRS with a low-grade temp of 100.4, noted to be tachycardic with heart rates as high as 113. - Patient with a pancytopenia. - Blood cultures obtained with no growth to date x 3 days. - Chest x-ray negative for any acute infiltrate. - Urinalysis cloudy, nitrite negative, leukocytes negative, many bacteria, 6-10 WBCs.  -Urine cultures with multiple species present - Status post 3 days IV cefepime.  -Monitor off antibiotics. - Supportive care.   2.  Symptomatic anemia/pancytopenia/folate deficiency -Patient  presenting with 1 day history of generalized weakness, dyspnea on exertion similar to episodes of anemia in the past per patient. -Likely secondary to multiple myeloma in the setting of recent chemotherapy. - Patient denies any overt bleeding. - Patient noted to be on oral iron supplementation at home. - Anemia panel with iron level of 110, ferritin > 7500, folate of 3.6, vitamin B12 of 544.  - FOBT negative. - Status post transfusion 3 units PRBCs with hemoglobin currently at 8.1 from a low of 5.2 on admission.  -CBC with a white count of 2.3, hemoglobin of 8.1, platelet count of 37K  with a ANC of 0.4. -Continue folic acid  1 mg daily. - Oncology informed of admission via epic and following. - Continue PPI.   3.  Hypokalemia - Potassium at 3.8 today.   - Magnesium  at 2.2.  - Repeat labs in AM.   4.  Multiple myeloma -Patient noted to be on carfilzomib, Revlimid, dexamethasonE . - Patient had presented to the cancer center/infusion center and sent to the ED. - Hematology/oncology informed of admission via epic.   5.  Hypertension - Coreg, Cozaar .     6.  Hyperlipidemia - Continue to hold statin as patient presented with generalized weakness.   - Would likely resume statin on discharge.     7.  Hypercalcemia of malignancy - Saline lock IV fluids. - Repeat labs in the AM. -Per oncology patient receiving Zometa  every 28 days with last dose of Zometa  08/25/2024. -Per oncology.   8.  Generalized weakness -Secondary to symptomatic anemia. - Status posttransfusion 3 units PRBCs.   -Improving clinically. - Continue to hold statin.   -  PT/OT.      DVT prophylaxis: SCDs Code Status: Full Family Communication: Updated patient.  No family at bedside. Disposition: Likely home when clinically improved, hemoglobin stabilized hopefully in the next 24 to 48 hours..  Status is: Inpatient The patient will require care spanning > 2 midnights and should be moved to inpatient because:  Severity of illness   Consultants:  Hematology/oncology informed of admission via epic  Procedures:  Transfuse 3 units PRBCs Chest x-ray 09/08/2024   Antimicrobials:  Anti-infectives (From admission, onward)    Start     Dose/Rate Route Frequency Ordered Stop   09/08/24 2200  acyclovir  (ZOVIRAX ) tablet 400 mg        400 mg Oral 2 times daily 09/08/24 1708     09/08/24 2200  ceFEPIme (MAXIPIME) 2 g in sodium chloride  0.9 % 100 mL IVPB        2 g 200 mL/hr over 30 Minutes Intravenous Every 8 hours 09/08/24 1731 09/10/24 2154   09/08/24 1615  ceFEPIme (MAXIPIME) 2 g in sodium chloride  0.9 % 100 mL IVPB        2 g 200 mL/hr over 30 Minutes Intravenous  Once 09/08/24 1604 09/08/24 1634         Subjective: Laying in bed, Patient states SOB, fatigue improved. Tolerating current diet.  Hoping to go home soon.  Objective: Vitals:   09/10/24 1341 09/10/24 2112 09/10/24 2112 09/11/24 0503  BP: 128/68 (!) 141/76 (!) 141/76 134/71  Pulse: 89 93 93 83  Resp:   14 14  Temp: 97.8 F (36.6 C) 98.4 F (36.9 C) 98.4 F (36.9 C) 98.1 F (36.7 C)  TempSrc:  Oral Oral Oral  SpO2: 97% 97% 97% 95%  Weight:      Height:        Intake/Output Summary (Last 24 hours) at 09/11/2024 1256 Last data filed at 09/11/2024 0506 Gross per 24 hour  Intake 600 ml  Output 750 ml  Net -150 ml   Filed Weights   09/08/24 1526 09/08/24 2315  Weight: 104.3 kg 99.7 kg    Examination:  General exam: NAD Respiratory system: Lungs clear to auscultation bilaterally.  No wheezes, no crackles, no rhonchi.  Fair air movement.  Speaking in full sentences.  Cardiovascular system: RRR no murmurs rubs or gallops.  No JVD.  No pitting lower extremity edema.  Gastrointestinal system: Abdomen soft, nontender, nondistended, positive bowel sounds.  No rebound.  No guarding.   Central nervous system: Alert and oriented. No focal neurological deficits. Extremities: Symmetric 5 x 5 power. Skin: No rashes, lesions or  ulcers Psychiatry: Judgement and insight appear normal. Mood & affect appropriate.     Data Reviewed: I have personally reviewed following labs and imaging studies  CBC: Recent Labs  Lab 09/08/24 1337 09/08/24 1337 09/08/24 1553 09/09/24 0115 09/09/24 1533 09/09/24 2219 09/10/24 0550 09/11/24 0709  WBC 3.7*  --  2.9* 2.4* 2.3*  --  2.4* 2.3*  NEUTROABS 0.4*  --  0.4*  --  0.3*  --  0.4* 0.4*  HGB 6.9*   < > 5.2* 6.3* 7.9* 8.4* 8.6* 8.1*  HCT 20.6*  --  16.8* 20.0* 25.4* 26.0* 26.8* 25.1*  MCV 79.2*  --  83.2 84.0 84.9  --  83.0 83.7  PLT 55*  --  46* 37* 39*  --  40* 37*   < > = values in this interval not displayed.    Basic Metabolic Panel: Recent Labs  Lab 09/08/24 1337 09/09/24  0115 09/10/24 0550 09/11/24 0709  NA 136 137 138 139  K 3.2* 3.7 2.8* 3.8  CL 98 104 104 106  CO2 26 25 25 25   GLUCOSE 131* 133* 110* 116*  BUN 17 17 11 13   CREATININE 0.58 0.58 0.47 0.44  CALCIUM  11.7* 11.2* 11.3* 11.4*  MG  --  1.9 1.8 2.2    GFR: Estimated Creatinine Clearance: 84 mL/min (by C-G formula based on SCr of 0.44 mg/dL).  Liver Function Tests: Recent Labs  Lab 09/08/24 1337 09/09/24 0115 09/10/24 0550  AST 68* 215* 96*  ALT 14 16 11   ALKPHOS 187* 188* 187*  BILITOT 0.4 0.3 0.4  PROT 8.0 6.6 6.8  ALBUMIN 3.4* 2.8* 2.8*    CBG: No results for input(s): GLUCAP in the last 168 hours.   Recent Results (from the past 240 hours)  Culture, blood (Routine x 2)     Status: None (Preliminary result)   Collection Time: 09/08/24  3:45 PM   Specimen: BLOOD  Result Value Ref Range Status   Specimen Description   Final    BLOOD RIGHT ANTECUBITAL Performed at [facility], [address] [place], [place] [address]    Special Requests   Final    BOTTLES DRAWN AEROBIC AND ANAEROBIC Blood Culture results may not be optimal due to an inadequate volume of blood received in culture bottles Performed at [facility], [address] [place], [place] [address]    Culture   Final    NO GROWTH 3 DAYS Performed at [facility], [address], [place], [place] [address]    Report Status PENDING  Incomplete  Culture, blood (Routine x 2)     Status: None (Preliminary result)   Collection Time: 09/08/24  3:49 PM   Specimen: BLOOD  Result Value Ref Range Status   Specimen Description   Final    BLOOD BLOOD RIGHT FOREARM Performed at [facility], [address] [place], [place] [address]    Special Requests   Final    BOTTLES DRAWN AEROBIC AND ANAEROBIC Blood Culture results may not be optimal due to an inadequate volume of blood received in culture bottles Performed at [facility], [address] [place], [place] [address]    Culture   Final    NO GROWTH 3 DAYS Performed at [facility], [address], [place], [place] [address]    Report Status PENDING  Incomplete  Resp panel by RT-PCR (RSV, Flu A&B, Covid)     Status: None   Collection Time: 09/08/24  4:05 PM   Specimen: Nasal Swab  Result Value Ref Range Status   SARS Coronavirus 2 by RT PCR NEGATIVE NEGATIVE Final    Comment: (NOTE) SARS-CoV-2 target nucleic acids are NOT DETECTED.  The SARS-CoV-2 RNA is generally detectable in upper respiratory specimens during the acute phase of infection. The lowest concentration of SARS-CoV-2 viral copies this assay can detect is 138 copies/mL. A negative result does not preclude SARS-Cov-2 infection and should not be used as the sole basis for treatment or other patient management decisions. A negative result may occur with  improper specimen collection/handling, submission of specimen other than nasopharyngeal swab, presence of viral mutation(s) within the areas targeted by this assay, and inadequate number of viral copies(<138 copies/mL). A negative result must be combined with clinical observations, patient history, and epidemiological information. The expected  result is Negative.  Fact Sheet for Patients:  bloggercourse.com  Fact Sheet for Healthcare Providers:  seriousbroker.it  This test is no t yet approved or cleared by the [place]  FDA and  has been authorized for detection and/or diagnosis of SARS-CoV-2 by FDA under an Emergency Use Authorization (EUA). This EUA will remain  in effect (meaning this test can be used) for the duration of the COVID-19 declaration under Section 564(b)(1) of the Act, 21 U.S.C.section 360bbb-3(b)(1), unless the authorization is terminated  or revoked sooner.       Influenza A by PCR NEGATIVE NEGATIVE Final   Influenza B by PCR NEGATIVE NEGATIVE Final    Comment: (NOTE) The Xpert Xpress SARS-CoV-2/FLU/RSV plus assay is intended as an aid in the diagnosis of influenza from Nasopharyngeal swab specimens and should not be used as a sole basis for treatment. Nasal washings and aspirates are unacceptable for Xpert Xpress SARS-CoV-2/FLU/RSV testing.  Fact Sheet for Patients: bloggercourse.com  Fact Sheet for Healthcare Providers: seriousbroker.it  This test is not yet approved or cleared by the [place]  FDA and has been authorized for detection and/or diagnosis of SARS-CoV-2 by FDA under an Emergency Use Authorization (EUA). This EUA will remain in effect (meaning this test can be used) for the duration of the COVID-19 declaration under Section 564(b)(1) of the Act, 21 U.S.C. section 360bbb-3(b)(1), unless the authorization is terminated or revoked.     Resp Syncytial Virus by PCR NEGATIVE NEGATIVE Final    Comment: (NOTE) Fact Sheet for Patients: bloggercourse.com  Fact Sheet for Healthcare Providers: seriousbroker.it  This test is not yet approved or cleared by the [place]  FDA and has been authorized for detection and/or diagnosis of  SARS-CoV-2 by FDA under an Emergency Use Authorization (EUA). This EUA will remain in effect (meaning this test can be used) for the duration of the COVID-19 declaration under Section 564(b)(1) of the Act, 21 U.S.C. section 360bbb-3(b)(1), unless the authorization is terminated or revoked.  Performed at [facility], [address] [place], [place] [address]   Urine Culture (for pregnant, neutropenic or urologic patients or patients with an indwelling urinary catheter)     Status: Abnormal   Collection Time: 09/08/24  5:28 PM   Specimen: Urine, Clean Catch  Result Value Ref Range Status   Specimen Description   Final    URINE, CLEAN CATCH Performed at [facility], [address], [place], [place] [address]    Special Requests   Final    NONE Performed at [facility], [address] [place], [place] [address]    Culture MULTIPLE SPECIES PRESENT, SUGGEST RECOLLECTION (A)  Final   Report Status 09/10/2024 FINAL  Final         Radiology Studies: No results found.       Scheduled Meds:  sodium chloride    Intravenous Once   acyclovir   400 mg Oral BID   aspirin  EC  81 mg Oral Daily   carvedilol  3.125 mg Oral BID WC   diclofenac  Sodium  2 g Topical BID   feeding supplement  237 mL Oral BID BM   ferrous sulfate   325 mg Oral Q breakfast   folic acid   1 mg Oral Daily   loratadine  10 mg Oral Daily   losartan   50 mg Oral Daily   pantoprazole   40 mg Oral Q0600   sodium chloride  flush  3 mL Intravenous Q12H   thiamine  100 mg Oral q morning   Continuous Infusions:  LOS: 2 days    Time spent: 35 minutes    Toribio Hummer, MD [facility]   To contact the attending provider between 7A-7P or the covering provider during after hours 7P-7A, please log into the web site [email or web address] and access using universal [place] password for that web site. If you do not have the password, please call the  hospital operator.  09/11/2024, 12:56 PM

## 2024-09-11 NOTE — Plan of Care (Signed)

## 2024-09-12 ENCOUNTER — Other Ambulatory Visit (HOSPITAL_COMMUNITY): Payer: Self-pay

## 2024-09-12 DIAGNOSIS — J449 Chronic obstructive pulmonary disease, unspecified: Secondary | ICD-10-CM

## 2024-09-12 DIAGNOSIS — D701 Agranulocytosis secondary to cancer chemotherapy: Secondary | ICD-10-CM | POA: Diagnosis not present

## 2024-09-12 DIAGNOSIS — C9000 Multiple myeloma not having achieved remission: Secondary | ICD-10-CM | POA: Diagnosis not present

## 2024-09-12 DIAGNOSIS — D509 Iron deficiency anemia, unspecified: Secondary | ICD-10-CM | POA: Diagnosis not present

## 2024-09-12 DIAGNOSIS — D649 Anemia, unspecified: Secondary | ICD-10-CM | POA: Diagnosis not present

## 2024-09-12 DIAGNOSIS — D61818 Other pancytopenia: Secondary | ICD-10-CM | POA: Diagnosis not present

## 2024-09-12 DIAGNOSIS — Z87898 Personal history of other specified conditions: Secondary | ICD-10-CM | POA: Diagnosis not present

## 2024-09-12 DIAGNOSIS — I10 Essential (primary) hypertension: Secondary | ICD-10-CM | POA: Diagnosis not present

## 2024-09-12 DIAGNOSIS — E876 Hypokalemia: Secondary | ICD-10-CM | POA: Diagnosis not present

## 2024-09-12 DIAGNOSIS — R6510 Systemic inflammatory response syndrome (SIRS) of non-infectious origin without acute organ dysfunction: Secondary | ICD-10-CM | POA: Diagnosis not present

## 2024-09-12 DIAGNOSIS — E538 Deficiency of other specified B group vitamins: Secondary | ICD-10-CM | POA: Diagnosis not present

## 2024-09-12 LAB — BASIC METABOLIC PANEL WITH GFR
Anion gap: 8 (ref 5–15)
BUN: 12 mg/dL (ref 6–20)
CO2: 28 mmol/L (ref 22–32)
Calcium: 11.8 mg/dL — ABNORMAL HIGH (ref 8.9–10.3)
Chloride: 103 mmol/L (ref 98–111)
Creatinine, Ser: 0.44 mg/dL (ref 0.44–1.00)
GFR, Estimated: >60 mL/min (ref >60)
Glucose, Bld: 103 mg/dL — ABNORMAL HIGH (ref 70–99)
Potassium: 3.6 mmol/L (ref 3.5–5.1)
Sodium: 139 mmol/L (ref 135–145)

## 2024-09-12 LAB — CBC WITH DIFFERENTIAL/PLATELET
Abs Immature Granulocytes: 0.05 K/uL (ref 0.00–0.07)
Basophils Absolute: 0 K/uL (ref 0.0–0.1)
Basophils Relative: 0 %
Eosinophils Absolute: 0 K/uL (ref 0.0–0.5)
Eosinophils Relative: 0 %
HCT: 25.3 % — ABNORMAL LOW (ref 36.0–46.0)
Hemoglobin: 8 g/dL — ABNORMAL LOW (ref 12.0–15.0)
Immature Granulocytes: 2 %
Lymphocytes Relative: 61 %
Lymphs Abs: 1.5 K/uL (ref 0.7–4.0)
MCH: 26.7 pg (ref 26.0–34.0)
MCHC: 31.6 g/dL (ref 30.0–36.0)
MCV: 84.3 fL (ref 80.0–100.0)
Monocytes Absolute: 0.5 K/uL (ref 0.1–1.0)
Monocytes Relative: 21 %
Neutro Abs: 0.4 K/uL — CL (ref 1.7–7.7)
Neutrophils Relative %: 16 %
Platelets: 36 K/uL — ABNORMAL LOW (ref 150–400)
RBC: 3 MIL/uL — ABNORMAL LOW (ref 3.87–5.11)
RDW: 14.6 % (ref 11.5–15.5)
WBC: 2.5 K/uL — ABNORMAL LOW (ref 4.0–10.5)
nRBC: 0 % (ref 0.0–0.2)

## 2024-09-12 LAB — MAGNESIUM: Magnesium: 2.1 mg/dL (ref 1.7–2.4)

## 2024-09-12 MED ORDER — POTASSIUM CHLORIDE CRYS ER 10 MEQ PO TBCR
40.0000 meq | EXTENDED_RELEASE_TABLET | Freq: Once | ORAL | Status: AC
  Administered 2024-09-12: 40 meq via ORAL
  Filled 2024-09-12: qty 4

## 2024-09-12 MED ORDER — DICLOFENAC SODIUM 1 % EX GEL: 2.0000 g | Freq: Two times a day (BID) | CUTANEOUS | Status: AC

## 2024-09-12 MED ORDER — PANTOPRAZOLE SODIUM 40 MG PO TBEC
40.0000 mg | DELAYED_RELEASE_TABLET | Freq: Every day | ORAL | 0 refills | Status: DC
  Filled 2024-09-12: qty 90, 90d supply, fill #0

## 2024-09-12 MED ORDER — FOLIC ACID 1 MG PO TABS: 1.0000 mg | ORAL_TABLET | Freq: Every day | ORAL | Status: DC

## 2024-09-12 NOTE — Plan of Care (Signed)
   Problem: Education: Goal: Knowledge of General Education information will improve Description: Including pain rating scale, medication(s)/side effects and non-pharmacologic comfort measures Outcome: Progressing   Problem: Health Behavior/Discharge Planning: Goal: Ability to manage health-related needs will improve Outcome: Progressing   Problem: Clinical Measurements: Goal: Will remain free from infection Outcome: Progressing   Problem: Nutrition: Goal: Adequate nutrition will be maintained Outcome: Progressing   Problem: Coping: Goal: Level of anxiety will decrease Outcome: Progressing   Problem: Safety: Goal: Ability to remain free from injury will improve Outcome: Progressing

## 2024-09-12 NOTE — TOC Progression Note (Addendum)
 Transition of Care [facility]) - Progression Note    Patient Details  Name: Carla Hanson MRN: [id number] Date of Birth: Dec 23, 1965  [facility] CM/SW Contact  Lorraine LILLETTE Fenton, LCSW Phone Number: 09/12/2024, 9:37 AM  Clinical Narrative:     CSW reviewed HHPT requests- no offers at this time.    Addendum: Bowen to MD requesting outpatient orders .   Expected Discharge Plan: Home w Home Health Services Barriers to Discharge: Continued Medical Work up               Expected Discharge Plan and Services In-house Referral: NA Discharge Planning Services: CM Consult Post Acute Care Choice: NA Living arrangements for the past 2 months: Apartment                 DME Arranged: N/A DME Agency: NA       HH Arranged: NA           Social Drivers of Health (SDOH) Interventions SDOH Screenings   Food Insecurity: No Food Insecurity (09/08/2024)  Housing: Low Risk  (09/08/2024)  Transportation Needs: No Transportation Needs (09/08/2024)  Utilities: Not At Risk (09/08/2024)  Depression (PHQ2-9): Low Risk  (09/08/2024)  Financial Resource Strain: Low Risk  (05/14/2024)  Physical Activity: Insufficiently Active (05/14/2024)  Social Connections: Moderately Integrated (05/14/2024)  Stress: No Stress Concern Present (05/14/2024)  Tobacco Use: Medium Risk (09/08/2024)    Readmission Risk Interventions    02/03/2024    1:14 PM  Readmission Risk Prevention Plan  Transportation Screening Complete  PCP or Specialist Appt within 3-5 Days Complete  Social Work Consult for Recovery Care Planning/Counseling Complete  Palliative Care Screening Not Applicable  Medication Review Oceanographer) Complete

## 2024-09-12 NOTE — Discharge Summary (Signed)
 Physician Discharge Summary  Carla Hanson FMW:[id number] DOB: 1966-01-22 DOA: 09/08/2024  PCP: Anders Otto DASEN, MD  Admit date: 09/08/2024 Discharge date: 09/12/2024  Time spent: 60 minutes  Recommendations for Outpatient Follow-up:  Follow-up with Anders Otto DASEN, MD in 2 weeks.  On follow-up patient's generalized weakness will need to be reassessed.  Patient will need a basic metabolic profile and magnesium  level done to follow-up on electrolytes and renal function.  Folate deficiency will need to be followed up upon. Follow-up with Dr. Onesimo as previously scheduled.   Discharge Diagnoses:  Principal Problem:   SIRS (systemic inflammatory response syndrome) (HCC) Active Problems:   Pancytopenia (HCC)   Symptomatic anemia   Hypokalemia   Hyperlipidemia   Essential hypertension   Iron deficiency anemia   History of hemorrhagic cerebrovascular accident (CVA) without residual deficits   COPD, moderate (HCC)   History of seizure   Hypercalcemia of malignancy   Multiple myeloma not having achieved remission (HCC)   Chemotherapy-induced neutropenia   Folate deficiency   Discharge Condition: Stable and improved.  Diet recommendation: Regular  Filed Weights   09/08/24 1526 09/08/24 2315  Weight: 104.3 kg 99.7 kg    History of present illness:   Carla Hanson is a 58 y.o. 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  Patient denies any fevers, no chills, no nausea, no vomiting, no chest pain, no constipation, no dysuria, no hematemesis, no hematochezia, no melena.  No  syncopal episode.  No productive cough.  Patient states mother felt stool may have been dark.  Patient seen at the cancer center labs obtained noted a pancytopenia with a white count of 3.7, hemoglobin of 6.9, platelet count of 55,000 and patient subsequently sent to the ED.   ED Course: Patient seen in the ED, repeat labs with a white count of 2.9, hemoglobin of 5.2, platelet count of 46K.  Comprehensive metabolic profile obtained at cancer center with a potassium of 3.2, glucose of 131, calcium  of 11.7, alk phosphatase of 187, albumin of 3.4, AST of 68 otherwise within normal limits.  Urinalysis pending.  Chest x-ray done with no acute intrathoracic process.  Chronic healed bilateral rib fractures.  Numerous small lytic lesions seen on recent PET scan and not as well-visualized by x-ray.  1 unit of PRBCs ordered by ED physician.  Hospitalist called to admit the patient.  Hospital Course:  #1 SIRS ruled out -Patient on admission met criteria for SIRS with a low-grade temp of 100.4, noted to be tachycardic with heart rates as high as 113. - Patient with a pancytopenia. - Blood cultures obtained with no growth to date x 4 days. - Chest x-ray negative for any acute infiltrate. - Urinalysis cloudy, nitrite negative, leukocytes negative, many bacteria, 6-10 WBCs.  -Urine cultures with multiple species present - Status post 3 days IV cefepime.  -Patient was monitored off antibiotics, remained stable, afebrile and asymptomatic. -Outpatient follow-up with PCP.   2.  Symptomatic anemia/pancytopenia/folate deficiency -Patient presented with 1 day history of generalized weakness, dyspnea on exertion similar to episodes of anemia in the past per patient. -Likely secondary to multiple myeloma in the setting  of recent chemotherapy. - Patient denied any overt bleeding. - Patient noted to be on oral iron supplementation at home. - Anemia panel with iron level of 110, ferritin > 7500, folate of 3.6, vitamin B12  of 544.  - FOBT negative. - Status post transfusion 3 units PRBCs with hemoglobin stabilized at 8.0 by day of discharge from a low of 5.2 on admission.  -CBC with a white count of 2.5, hemoglobin of 8.0, platelet count of 36K  with a ANC of 0.4 on day of discharge. - Patient maintained on folic acid  1 mg daily.   - Patient seen by oncology during the hospitalization who felt no further workup needed in terms of patient's pancytopenia.   - Patient maintained on PPI.   - Outpatient follow-up with hematology/oncology.   3.  Hypokalemia - Repleted during the hospitalization.   - Outpatient follow-up with PCP and hematology/oncology.   4.  Multiple myeloma -Patient noted to be on carfilzomib, Revlimid, dexamethasonE . - Patient had presented to the cancer center/infusion center and sent to the ED. - Hematology/oncology informed of admission via epic.   5.  Hypertension - Patient maintained on home regimen Coreg, Cozaar .     6.  Hyperlipidemia - Statin held during the hospitalization that patient had presented with generalized weakness.   - It is noted per med rec that patient statin was discontinued by her provider.   - Outpatient follow-up.    7.  Hypercalcemia of malignancy - Patient initially placed on IV fluids on admission. -Calcium  levels remained stable. -Per oncology patient receiving Zometa  every 28 days with last dose of Zometa  08/25/2024. -Outpatient follow-up with oncology as previously scheduled.   8.  Generalized weakness -Secondary to symptomatic anemia. - Status posttransfusion 3 units PRBCs.   - Patient improved clinically.   - Statin held during the hospitalization.  - Patient seen by PT/OT who recommended home health however per [facility] patient unable to receive home health due to insurance issues.  - Outpatient ambulatory referral made for PT.     Procedures: Transfuse 3 units PRBCs Chest x-ray 09/08/2024  Consultations: Hematology/oncology informed of admission  via epic   Discharge Exam: Vitals:   09/11/24 2029 09/12/24 0536  BP: 134/68 (!) 146/70  Pulse: 76 78  Resp: 20 20  Temp: 99.1 F (37.3 C) 98.6 F (37 C)  SpO2: 98% 97%    General: NAD Cardiovascular: RRR no murmurs rubs or gallops.  No JVD.  No lower extremity edema. Respiratory: Clear to auscultation bilaterally.  No wheezes, no crackles, no rhonchi.  Fair air movement.  Speaking in full sentences.  Discharge Instructions   Discharge Instructions     Ambulatory referral to Physical Therapy   Complete by: As directed    Diet general   Complete by: As directed    Increase activity slowly   Complete by: As directed       Allergies as of 09/12/2024       Reactions   Latuda  [lurasidone ] Other (See Comments)   Made leg muscles twitch (went off this herself in December, 2017)        Medication List     PAUSE taking these medications    rosuvastatin  20 MG tablet Wait to take this until your doctor or other care provider tells you to start again. Commonly known as: CRESTOR  Take 1 tablet (20 mg total) by mouth daily.       TAKE these medications    acetaminophen  325 MG tablet Commonly known  as: TYLENOL  Take 2 tablets (650 mg total) by mouth every 6 (six) hours  if needed for mild pain (1-3) for up to 5 days. What changed:  how much to take when to take this reasons to take this   acyclovir  400 MG tablet Commonly known as: ZOVIRAX  Take 1 tablet (400 mg total) by mouth 2 (two) times daily.   albuterol  (2.5 MG/3ML) 0.083% nebulizer solution Commonly known as: PROVENTIL  Take 3 mLs (2.5 mg total) by nebulization every 6 (six) hours as needed for wheezing or shortness of breath.   Ventolin  HFA 108 (90 Base) MCG/ACT inhaler Generic drug: albuterol  Inhale 2 puffs into the lungs every 6 (six) hours as needed for wheezing or shortness of breath.   aspirin  EC 81 MG tablet Take 1 tablet (81 mg total) by mouth daily.   carvedilol 3.125 MG tablet Commonly  known as: COREG Take 1 tablet (3.125 mg total) by mouth in the morning and evening with meals.   cetirizine  10 MG tablet Commonly known as: ZYRTEC  Take 1 tablet (10 mg total) by mouth daily. What changed: when to take this   diclofenac  Sodium 1 % Gel Commonly known as: VOLTAREN  Apply 2 g topically 2 (two) times daily for 5 days.   ferrous sulfate  325 (65 FE) MG tablet Take 325 mg by mouth daily with breakfast.   folic acid  1 MG tablet Commonly known as: FOLVITE  Take 1 tablet (1 mg total) by mouth daily. Start taking on: September 13, 2024   losartan  50 MG tablet Commonly known as: COZAAR  Take 1 tablet (50 mg total) by mouth daily.   pantoprazole  40 MG tablet Commonly known as: PROTONIX  Take 1 tablet (40 mg total) by mouth daily at 6 (six) AM. Start taking on: September 13, 2024   potassium chloride  SA 20 MEQ tablet Commonly known as: KLOR-CON  M Take 2 tablets (40 mEq total) by mouth 3 (three) times daily for 15 days.   prochlorperazine  10 MG tablet Commonly known as: COMPAZINE  Take 1 tablet (10 mg total) by mouth every 6 (six) hours as needed for nausea or vomiting   Systane Ultra 0.4-0.3 % Soln Generic drug: Polyethyl Glycol-Propyl Glycol Place 1 drop into both eyes 3 (three) times daily as needed (for dryness).   thiamine 100 MG tablet Commonly known as: VITAMIN B1 Take 1 tablet (100 mg total) by mouth every morning.   tiZANidine  4 MG tablet Commonly known as: Zanaflex  Take 1 tablet (4 mg total) by mouth 2 (two) times daily as needed for muscle spasms.       Allergies  Allergen Reactions   Latuda  [Lurasidone ] Other (See Comments)    Made leg muscles twitch (went off this herself in December, 2017)    Follow-up Information     Anders Cumins T, MD. Schedule an appointment as soon as possible for a visit in 2 week(s).   Specialty: Family Medicine Contact information: [address] [place] [address] [phone]         Onesimo Emaline Brink, MD Follow up.   Specialties: Hematology, Oncology Why: Follow-up as scheduled. Contact information: [address] [place] [address] [phone]                  The results of significant diagnostics from this hospitalization (including imaging, microbiology, ancillary and laboratory) are listed below for reference.    Significant Diagnostic Studies: DG Chest Port 1 View if patient is in a treatment room. Result Date: 09/08/2024 CLINICAL DATA:  Anemia, dark stools, suspected sepsis EXAM: PORTABLE CHEST 1 VIEW COMPARISON:  01/30/2024 FINDINGS: Single frontal view of the chest demonstrates stable enlargement of the cardiac silhouette. No acute airspace disease, effusion, or pneumothorax. Multiple healed bilateral rib fractures are noted. The multiple small lytic lesions seen on prior PET scan are not as well visualized by x-ray. IMPRESSION: 1. No acute intrathoracic process. 2. Chronic healed bilateral rib fractures. The numerous small lytic lesions seen on recent PET scan are not as well visualized by x-ray. Electronically Signed   By: Ozell Daring M.D.   On: 09/08/2024 16:20    Microbiology: Recent Results (from the past 240 hours)  Culture, blood (Routine x 2)     Status: None (Preliminary result)   Collection Time: 09/08/24  3:45 PM   Specimen: BLOOD  Result Value Ref Range Status   Specimen Description   Final    BLOOD RIGHT ANTECUBITAL Performed at [facility], [address], [place], [place] [address]    Special Requests   Final    BOTTLES DRAWN AEROBIC AND ANAEROBIC Blood Culture results may not be optimal due to an inadequate volume of blood received in culture bottles Performed at [facility], [address] [place], [place] [address]    Culture   Final    NO GROWTH 4 DAYS Performed at [facility], [address], [place], [place] [address]    Report Status PENDING  Incomplete  Culture,  blood (Routine x 2)     Status: None (Preliminary result)   Collection Time: 09/08/24  3:49 PM   Specimen: BLOOD  Result Value Ref Range Status   Specimen Description   Final    BLOOD BLOOD RIGHT FOREARM Performed at [facility], [address], [place], [place] [address]    Special Requests   Final    BOTTLES DRAWN AEROBIC AND ANAEROBIC Blood Culture results may not be optimal due to an inadequate volume of blood received in culture bottles Performed at [facility], [address] [place], [place] [address]    Culture   Final    NO GROWTH 4 DAYS Performed at [facility], [address] [place], [place] [address]    Report Status PENDING  Incomplete  Resp panel by RT-PCR (RSV, Flu A&B, Covid)     Status: None   Collection Time: 09/08/24  4:05 PM   Specimen: Nasal Swab  Result Value Ref Range Status   SARS Coronavirus 2 by RT PCR NEGATIVE NEGATIVE Final    Comment: (NOTE) SARS-CoV-2 target nucleic acids are NOT DETECTED.  The SARS-CoV-2 RNA is generally detectable in upper respiratory specimens during the acute phase of infection. The lowest concentration of SARS-CoV-2 viral copies this assay can detect is 138 copies/mL. A negative result does not preclude SARS-Cov-2 infection and should not be used as the sole basis for treatment or other patient management decisions. A negative result may occur with  improper specimen collection/handling, submission of specimen other than nasopharyngeal swab, presence of viral mutation(s) within the areas targeted by this assay, and inadequate number of viral copies(<138 copies/mL). A negative result must be combined with clinical observations, patient history, and epidemiological information. The expected result is Negative.  Fact Sheet for Patients:  bloggercourse.com  Fact Sheet for Healthcare Providers:  seriousbroker.it  This test is no  t yet approved or cleared by the [place]  FDA and  has been authorized for detection and/or diagnosis  of SARS-CoV-2 by FDA under an Emergency Use Authorization (EUA). This EUA will remain  in effect (meaning this test can be used) for the duration of the COVID-19 declaration under Section 564(b)(1) of the Act, 21 U.S.C.section 360bbb-3(b)(1), unless the authorization is terminated  or revoked sooner.       Influenza A by PCR NEGATIVE NEGATIVE Final   Influenza B by PCR NEGATIVE NEGATIVE Final    Comment: (NOTE) The Xpert Xpress SARS-CoV-2/FLU/RSV plus assay is intended as an aid in the diagnosis of influenza from Nasopharyngeal swab specimens and should not be used as a sole basis for treatment. Nasal washings and aspirates are unacceptable for Xpert Xpress SARS-CoV-2/FLU/RSV testing.  Fact Sheet for Patients: bloggercourse.com  Fact Sheet for Healthcare Providers: seriousbroker.it  This test is not yet approved or cleared by the [place]  FDA and has been authorized for detection and/or diagnosis of SARS-CoV-2 by FDA under an Emergency Use Authorization (EUA). This EUA will remain in effect (meaning this test can be used) for the duration of the COVID-19 declaration under Section 564(b)(1) of the Act, 21 U.S.C. section 360bbb-3(b)(1), unless the authorization is terminated or revoked.     Resp Syncytial Virus by PCR NEGATIVE NEGATIVE Final    Comment: (NOTE) Fact Sheet for Patients: bloggercourse.com  Fact Sheet for Healthcare Providers: seriousbroker.it  This test is not yet approved or cleared by the [place]  FDA and has been authorized for detection and/or diagnosis of SARS-CoV-2 by FDA under an Emergency Use Authorization (EUA). This EUA will remain in effect (meaning this test can be used) for the duration of the COVID-19 declaration under Section  564(b)(1) of the Act, 21 U.S.C. section 360bbb-3(b)(1), unless the authorization is terminated or revoked.  Performed at [facility], [address], [place], [place] [address]   Urine Culture (for pregnant, neutropenic or urologic patients or patients with an indwelling urinary catheter)     Status: Abnormal   Collection Time: 09/08/24  5:28 PM   Specimen: Urine, Clean Catch  Result Value Ref Range Status   Specimen Description   Final    URINE, CLEAN CATCH Performed at [facility], [address] [place], [place] [address]    Special Requests   Final    NONE Performed at [facility], [address], [place], [place] [address]    Culture MULTIPLE SPECIES PRESENT, SUGGEST RECOLLECTION (A)  Final   Report Status 09/10/2024 FINAL  Final     Labs: Basic Metabolic Panel: Recent Labs  Lab 09/08/24 1337 09/09/24 0115 09/10/24 0550 09/11/24 0709 09/12/24 0720  NA 136 137 138 139 139  K 3.2* 3.7 2.8* 3.8 3.6  CL 98 104 104 106 103  CO2 26 25 25 25 28   GLUCOSE 131* 133* 110* 116* 103*  BUN 17 17 11 13 12   CREATININE 0.58 0.58 0.47 0.44 0.44  CALCIUM  11.7* 11.2* 11.3* 11.4* 11.8*  MG  --  1.9 1.8 2.2 2.1   Liver Function Tests: Recent Labs  Lab 09/08/24 1337 09/09/24 0115 09/10/24 0550  AST 68* 215* 96*  ALT 14 16 11   ALKPHOS 187* 188* 187*  BILITOT 0.4 0.3 0.4  PROT 8.0 6.6 6.8  ALBUMIN 3.4* 2.8* 2.8*   No results for input(s): LIPASE, AMYLASE in the last 168 hours. No results for input(s): AMMONIA in the last 168 hours. CBC: Recent Labs  Lab 09/08/24 1553 09/09/24 0115 09/09/24 1533 09/09/24 2219 09/10/24 0550 09/11/24 0709 09/12/24 0720  WBC  2.9* 2.4* 2.3*  --  2.4* 2.3* 2.5*  NEUTROABS 0.4*  --  0.3*  --  0.4* 0.4* 0.4*  HGB 5.2* 6.3* 7.9* 8.4* 8.6* 8.1* 8.0*  HCT 16.8* 20.0* 25.4* 26.0* 26.8* 25.1* 25.3*  MCV 83.2 84.0 84.9  --  83.0 83.7 84.3  PLT 46* 37* 39*  --  40* 37* 36*    Cardiac Enzymes: No results for input(s): CKTOTAL, CKMB, CKMBINDEX, TROPONINI in the last 168 hours. BNP: BNP (last 3 results) No results for input(s): BNP in the last 8760 hours.  ProBNP (last 3 results) No results for input(s): PROBNP in the last 8760 hours.  CBG: No results for input(s): GLUCAP in the last 168 hours.     Signed:  Toribio Hummer MD.  [facility] 09/12/2024, 11:37 AM

## 2024-09-13 ENCOUNTER — Telehealth: Payer: Self-pay

## 2024-09-13 ENCOUNTER — Other Ambulatory Visit: Payer: Self-pay

## 2024-09-13 LAB — CULTURE, BLOOD (ROUTINE X 2)
Culture: NO GROWTH
Culture: NO GROWTH

## 2024-09-13 MED ORDER — FOLIC ACID 1 MG PO TABS: 1.0000 mg | ORAL_TABLET | Freq: Every day | ORAL | 0 refills | Status: DC

## 2024-09-13 NOTE — Transitions of Care (Post Inpatient/ED Visit) (Signed)
   09/13/2024  Name: Carla Hanson MRN: [id number] DOB: 09-May-1966  Today's TOC FU Call Status: Today's TOC FU Call Status:: Unsuccessful Call (1st Attempt) Unsuccessful Call (1st Attempt) Date: 09/13/24  Attempted to reach the patient regarding the most recent Inpatient/ED visit.  Follow Up Plan: Additional outreach attempts will be made to reach the patient to complete the Transitions of Care (Post Inpatient/ED visit) call.   Shona Prow RN, CCM [place]  VBCI-Population Health RN Care Manager [phone]

## 2024-09-14 ENCOUNTER — Telehealth: Payer: Self-pay

## 2024-09-14 MED FILL — Dexamethasone Sodium Phosphate Inj 100 MG/10ML: INTRAMUSCULAR | Qty: 2 | Status: AC

## 2024-09-14 NOTE — Transitions of Care (Post Inpatient/ED Visit) (Signed)
   09/14/2024  Name: Carla Hanson MRN: [id number] DOB: 1966-07-24  Today's TOC FU Call Status: Today's TOC FU Call Status:: Unsuccessful Call (2nd Attempt) Unsuccessful Call (2nd Attempt) Date: 09/14/24  Attempted to reach the patient regarding the most recent Inpatient/ED visit.  Follow Up Plan: Additional outreach attempts will be made to reach the patient to complete the Transitions of Care (Post Inpatient/ED visit) call.   Shona Prow RN, CCM [place]  VBCI-Population Health RN Care Manager [phone]

## 2024-09-15 ENCOUNTER — Inpatient Hospital Stay: Admitting: Physician Assistant

## 2024-09-15 ENCOUNTER — Other Ambulatory Visit: Payer: Self-pay | Admitting: Hematology

## 2024-09-15 ENCOUNTER — Other Ambulatory Visit (HOSPITAL_COMMUNITY): Payer: Self-pay

## 2024-09-15 ENCOUNTER — Inpatient Hospital Stay

## 2024-09-15 ENCOUNTER — Other Ambulatory Visit: Payer: Self-pay

## 2024-09-15 ENCOUNTER — Telehealth: Payer: Self-pay | Admitting: *Deleted

## 2024-09-15 ENCOUNTER — Inpatient Hospital Stay: Admitting: Nutrition

## 2024-09-15 ENCOUNTER — Encounter: Payer: Self-pay | Admitting: Hematology

## 2024-09-15 ENCOUNTER — Telehealth: Payer: Self-pay

## 2024-09-15 ENCOUNTER — Other Ambulatory Visit (INDEPENDENT_AMBULATORY_CARE_PROVIDER_SITE_OTHER): Payer: Self-pay | Admitting: Family Medicine

## 2024-09-15 VITALS — BP 124/87 | HR 113 | Temp 97.9°F | Resp 18 | Ht 61.0 in | Wt 218.0 lb

## 2024-09-15 VITALS — HR 107

## 2024-09-15 DIAGNOSIS — J449 Chronic obstructive pulmonary disease, unspecified: Secondary | ICD-10-CM | POA: Diagnosis not present

## 2024-09-15 DIAGNOSIS — E785 Hyperlipidemia, unspecified: Secondary | ICD-10-CM | POA: Diagnosis not present

## 2024-09-15 DIAGNOSIS — E538 Deficiency of other specified B group vitamins: Secondary | ICD-10-CM

## 2024-09-15 DIAGNOSIS — C9000 Multiple myeloma not having achieved remission: Secondary | ICD-10-CM

## 2024-09-15 DIAGNOSIS — I10 Essential (primary) hypertension: Secondary | ICD-10-CM | POA: Diagnosis not present

## 2024-09-15 DIAGNOSIS — R7303 Prediabetes: Secondary | ICD-10-CM | POA: Diagnosis not present

## 2024-09-15 DIAGNOSIS — Z87891 Personal history of nicotine dependence: Secondary | ICD-10-CM | POA: Diagnosis not present

## 2024-09-15 DIAGNOSIS — D649 Anemia, unspecified: Secondary | ICD-10-CM | POA: Diagnosis not present

## 2024-09-15 DIAGNOSIS — D61818 Other pancytopenia: Secondary | ICD-10-CM

## 2024-09-15 DIAGNOSIS — Z5111 Encounter for antineoplastic chemotherapy: Secondary | ICD-10-CM | POA: Diagnosis present

## 2024-09-15 DIAGNOSIS — Z86718 Personal history of other venous thrombosis and embolism: Secondary | ICD-10-CM | POA: Diagnosis not present

## 2024-09-15 DIAGNOSIS — C9002 Multiple myeloma in relapse: Secondary | ICD-10-CM | POA: Diagnosis not present

## 2024-09-15 LAB — CBC WITH DIFFERENTIAL (CANCER CENTER ONLY)
Abs Immature Granulocytes: 0.04 K/uL (ref 0.00–0.07)
Basophils Absolute: 0 K/uL (ref 0.0–0.1)
Basophils Relative: 0 %
Eosinophils Absolute: 0 K/uL (ref 0.0–0.5)
Eosinophils Relative: 0 %
HCT: 27.4 % — ABNORMAL LOW (ref 36.0–46.0)
Hemoglobin: 9.1 g/dL — ABNORMAL LOW (ref 12.0–15.0)
Immature Granulocytes: 1 %
Lymphocytes Relative: 61 %
Lymphs Abs: 2 K/uL (ref 0.7–4.0)
MCH: 27.2 pg (ref 26.0–34.0)
MCHC: 33.2 g/dL (ref 30.0–36.0)
MCV: 82 fL (ref 80.0–100.0)
Monocytes Absolute: 0.6 K/uL (ref 0.1–1.0)
Monocytes Relative: 18 %
Neutro Abs: 0.6 K/uL — ABNORMAL LOW (ref 1.7–7.7)
Neutrophils Relative %: 20 %
Platelet Count: 52 K/uL — ABNORMAL LOW (ref 150–400)
RBC: 3.34 MIL/uL — ABNORMAL LOW (ref 3.87–5.11)
RDW: 14 % (ref 11.5–15.5)
WBC Count: 3.2 K/uL — ABNORMAL LOW (ref 4.0–10.5)
nRBC: 0 % (ref 0.0–0.2)

## 2024-09-15 LAB — CMP (CANCER CENTER ONLY)
ALT: 12 U/L (ref 0–44)
AST: 19 U/L (ref 15–41)
Albumin: 3.3 g/dL — ABNORMAL LOW (ref 3.5–5.0)
Alkaline Phosphatase: 155 U/L — ABNORMAL HIGH (ref 38–126)
Anion gap: 8 (ref 5–15)
BUN: 9 mg/dL (ref 6–20)
CO2: 29 mmol/L (ref 22–32)
Calcium: 13.1 mg/dL (ref 8.9–10.3)
Chloride: 101 mmol/L (ref 98–111)
Creatinine: 0.5 mg/dL (ref 0.44–1.00)
GFR, Estimated: >60 mL/min (ref >60)
Glucose, Bld: 125 mg/dL — ABNORMAL HIGH (ref 70–99)
Potassium: 4.8 mmol/L (ref 3.5–5.1)
Sodium: 138 mmol/L (ref 135–145)
Total Bilirubin: 0.4 mg/dL (ref 0.0–1.2)
Total Protein: 7.8 g/dL (ref 6.5–8.1)

## 2024-09-15 MED ORDER — SODIUM CHLORIDE 0.9 % IV SOLN: INTRAVENOUS | Status: DC

## 2024-09-15 MED ORDER — SODIUM CHLORIDE 0.9 % IV SOLN
20.0000 mg | Freq: Once | INTRAVENOUS | Status: AC
  Administered 2024-09-15: 20 mg via INTRAVENOUS
  Filled 2024-09-15: qty 20

## 2024-09-15 MED ORDER — ACETAMINOPHEN 500 MG PO TABS
1000.0000 mg | ORAL_TABLET | Freq: Once | ORAL | Status: AC
  Administered 2024-09-15: 1000 mg via ORAL
  Filled 2024-09-15: qty 2

## 2024-09-15 MED ORDER — CALCITONIN (SALMON) 200 UNIT/ACT NA SOLN
1.0000 | Freq: Every day | NASAL | 12 refills | Status: DC
  Filled 2024-09-15: qty 3.7, 30d supply, fill #0
  Filled 2024-09-17: qty 3.7, 41d supply, fill #0

## 2024-09-15 MED ORDER — FOLIC ACID 1 MG PO TABS: 1.0000 mg | ORAL_TABLET | Freq: Two times a day (BID) | ORAL | 1 refills | Status: AC

## 2024-09-15 MED ORDER — SODIUM CHLORIDE 0.9 % IV SOLN: Freq: Once | INTRAVENOUS | Status: DC

## 2024-09-15 MED ORDER — SODIUM CHLORIDE 0.9 % IV SOLN: INTRAVENOUS | Status: AC

## 2024-09-15 MED ORDER — DEXTROSE 5 % IV SOLN
20.0000 mg/m2 | Freq: Once | INTRAVENOUS | Status: AC
  Administered 2024-09-15: 40 mg via INTRAVENOUS
  Filled 2024-09-15: qty 15

## 2024-09-15 MED ORDER — PALONOSETRON HCL INJECTION 0.25 MG/5ML
0.2500 mg | Freq: Once | INTRAVENOUS | Status: AC
  Administered 2024-09-15: 0.25 mg via INTRAVENOUS
  Filled 2024-09-15: qty 5

## 2024-09-15 MED ORDER — DIPHENHYDRAMINE HCL 25 MG PO CAPS
25.0000 mg | ORAL_CAPSULE | Freq: Once | ORAL | Status: AC
  Administered 2024-09-15: 25 mg via ORAL
  Filled 2024-09-15: qty 1

## 2024-09-15 MED ORDER — THIAMINE HCL 100 MG PO TABS
100.0000 mg | ORAL_TABLET | Freq: Every day | ORAL | 3 refills | Status: AC
  Filled 2024-09-15: qty 30, 30d supply, fill #0
  Filled 2024-09-23 – 2024-10-03 (×2): qty 30, 30d supply, fill #1
  Filled 2024-11-27: qty 30, 30d supply, fill #2

## 2024-09-15 NOTE — Progress Notes (Signed)
 58 year old female diagnosed with multiple myeloma and followed by Dr. Onesimo.  Patient is receiving KCd every 28 days for 6 cycles.  Past medical history includes alcohol usage, anxiety, bipolar disease, COPD, edema, hyperlipidemia, hypertension, DVT, obesity, prediabetes, tobacco, and stroke.  Medications include vitamin B1, ferrous sulfate , Folvite , Protonix , Klor-Con , Compazine .  Labs include glucose 125, calcium  13.1, albumin 3.3  Height: 5 feet 1 inch. Weight: 218 pounds November 12.   Usual body weight: Patient reports 140 pounds.  Review of data on flow chart reflects weight varies between 218 pounds and 262 pounds over the past 3 months BMI: 41.19  Patient does not feel well today so I have been unable to reweigh her.  Reports she felt slightly better after she had half a bowl of tomato soup.  This was the first thing she had eaten today.  She has been drinking a Premier protein shake most days.  She denied other nutrition impact symptoms but was not able to provide much history.  She did not voice any questions.  Nutrition diagnosis:  Food and nutrition related knowledge deficit related to multiple myeloma as evidenced by no prior need for nutrition related information.  Intervention: Educated patient on importance of smaller more frequent meals and snacks with a protein food at each meal and snack.  Provided handouts. Encourage patient to continue 1 Premier protein shake every day. Will monitor weight trends.  Monitoring, evaluation, goals: Patient will tolerate adequate calories and protein to minimize loss of lean body mass.  Next visit: To be scheduled as needed.  **Disclaimer: This note was dictated with voice recognition software. Similar sounding words can inadvertently be transcribed and this note may contain transcription errors which may not have been corrected upon publication of note.**

## 2024-09-15 NOTE — Telephone Encounter (Signed)
 CRITICAL VALUE STICKER  CRITICAL VALUE: Calcium  13.1  RECEIVER (on-site recipient of call): Sandi K, RN  DATE & TIME NOTIFIED: 09/28/24; 1030  MESSENGER (representative from lab): Harlene  MD NOTIFIED: Dr. Olena RN   TIME OF NOTIFICATION:1035  RESPONSE: Information acknowledged

## 2024-09-15 NOTE — Transitions of Care (Post Inpatient/ED Visit) (Signed)
   09/15/2024  Name: CHI WOODHAM MRN: [id number] DOB: 08-Jan-1966  Today's TOC FU Call Status: Today's TOC FU Call Status:: Unsuccessful Call (3rd Attempt) Unsuccessful Call (3rd Attempt) Date: 09/15/24  Attempted to reach the patient regarding the most recent Inpatient/ED visit.  Follow Up Plan: No further outreach attempts will be made at this time. We have been unable to contact the patient.  Shona Prow RN, CCM [place]  VBCI-Population Health RN Care Manager [phone]

## 2024-09-15 NOTE — Progress Notes (Signed)
 HEMATOLOGY/ONCOLOGY CLINIC NOTE  Date of Service: 09/15/24  Patient Care Team: Anders Otto DASEN, MD as PCP - General (Family Medicine) Rosemarie [name] RAMAN, MD as Consulting Physician (Neurology) Wonda Cy BROCKS, RD as Dietitian (Family Medicine) Onesimo Emaline Brink, MD as Consulting Physician (Hematology)  CHIEF COMPLAINTS/PURPOSE OF CONSULTATION:  F/u for evaluation and mx of Multiple myeloma  INTERVAL HISTORY:  Carla Hanson is a 58 y.o. female here for evaluation and management of multiple myeloma. He was last seen by Dr. Onesimo on 08/25/2024. In the interim, she was hospitalized from 08/08/24-08/12/24 due to omplaints of progressive weakness and DOE.  Carla Hanson is accompanied by her mother for this visit.  She reports that her energy levels are slowly improving but still feeling weak compared to her baseline.  She is waiting to start physical therapy to work on improving her strength.  She can walk short distances but then requires resting.  She has some shortness of breath with exertion but not at rest.  She reports her appetite is good and she denies any dietary restrictions.  She denies nausea, vomiting or bowel habit changes.  She denies easy bruising or signs of active bleeding.  She reports that her lower lips have been numb over the last 2 to 3 weeks.  She denies fevers, chills, night sweats, chest pain or cough.  Rest of 10 point ROS as below.   MEDICAL HISTORY:  Past Medical History:  Diagnosis Date   Alcohol abuse    Anxiety    Back pain    Bipolar disorder (HCC)    Bronchitis    COPD exacerbation (HCC) 05/10/2016   Drug use    Edema 02/24/2024   History of blood clots    Hyperlipidemia    Hypertension    Influenza A 12/01/2018   Joint pain    Left leg DVT (HCC) 09/29/2013   Lipoma    Abdomen   LIPOMA 01/20/2008   Obesity    Occasional tremors 05/12/2019   Osteoarthritis of right knee    Other fatigue    Painful menstrual periods 01/05/2012   Pneumonia     Prediabetes    Seizure (HCC)    Shortness of breath on exertion    Stroke (HCC)    Tobacco abuse     SURGICAL HISTORY: Past Surgical History:  Procedure Laterality Date   CESAREAN SECTION     CYSTECTOMY     LIPOMA EXCISION  03/2011   ORIF ANKLE FRACTURE  03/13/2012   Procedure: OPEN REDUCTION INTERNAL FIXATION (ORIF) ANKLE FRACTURE;  Surgeon: Lynwood FORBES Better, MD;  Location: [facility];  Service: Orthopedics;  Laterality: Right;   TUBAL LIGATION      SOCIAL HISTORY: Social History   Socioeconomic History   Marital status: Divorced    Spouse name: Not on file   Number of children: 4   Years of education: 10   Highest education level: Not on file  Occupational History   Occupation: stay at home  Tobacco Use   Smoking status: Former    Current packs/day: 0.00    Average packs/day: 0.5 packs/day for 35.0 years (17.5 ttl pk-yrs)    Types: Cigarettes    Start date: 11/04/1981    Quit date: 10/2016    Years since quitting: 7.9   Smokeless tobacco: Never  Vaping Use   Vaping status: Never Used  Substance and Sexual Activity   Alcohol use: No    Alcohol/week: 0.0 standard drinks of alcohol  Comment: Quit 10/2016   Drug use: Yes    Types: Marijuana   Sexual activity: Not Currently    Comment: tubal  Other Topics Concern   Not on file  Social History Narrative   Unemployed, 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  Considering nursing.    Lives at home alone   Right-handed   Caffeine: not much   Social Drivers of Corporate Investment Banker Strain: Low Risk  (05/14/2024)   Overall Financial Resource Strain (CARDIA)    Difficulty of Paying Living Expenses: Not hard at all  Food Insecurity: No Food Insecurity (09/08/2024)   Hunger Vital Sign    Worried About Running Out of Food in the Last Year: Never true    Ran Out of Food in the Last Year: Never true  Transportation Needs: No Transportation  Needs (09/08/2024)   PRAPARE - Administrator, Civil Service (Medical): No    Lack of Transportation (Non-Medical): No  Physical Activity: Insufficiently Active (05/14/2024)   Exercise Vital Sign    Days of Exercise per Week: 3 days    Minutes of Exercise per Session: 20 min  Stress: No Stress Concern Present (05/14/2024)   [employer] of Occupational Health - Occupational Stress Questionnaire    Feeling of Stress: Not at all  Social Connections: Moderately Integrated (05/14/2024)   Social Connection and Isolation Panel    Frequency of Communication with Friends and Family: More than three times a week    Frequency of Social Gatherings with Friends and Family: More than three times a week    Attends Religious Services: More than 4 times per year    Active Member of [employer] or Organizations: Yes    Attends Banker Meetings: 1 to 4 times per year    Marital Status: Divorced  Catering Manager Violence: Not At Risk (09/08/2024)   Humiliation, Afraid, Rape, and Kick questionnaire    Fear of Current or Ex-Partner: No    Emotionally Abused: No    Physically Abused: No    Sexually Abused: No    FAMILY HISTORY: Family History  Problem Relation Age of Onset   Other Mother        High blood pressure runs in the family   Obesity Mother    Asthma Father    Heart attack Father    Obesity Father    Diabetes Maternal Aunt    Breast cancer Neg Hx     ALLERGIES:  is allergic to latuda  [lurasidone ].  MEDICATIONS:  Current Outpatient Medications  Medication Sig Dispense Refill   acetaminophen  (TYLENOL ) 325 MG tablet Take 2 tablets (650 mg total) by mouth every 6 (six) hours  if needed for mild pain (1-3) for up to 5 days. (Patient taking differently: Take 325-650 mg by mouth every 6 (six) hours as needed for mild pain (pain score 1-3) (or headaches).) 30 tablet 0   acyclovir  (ZOVIRAX ) 400 MG tablet Take 1 tablet (400 mg total) by mouth 2 (two) times daily. 60 tablet 5    albuterol  (PROVENTIL ) (2.5 MG/3ML) 0.083% nebulizer solution Take 3 mLs (2.5 mg total) by nebulization every 6 (six) hours as needed for wheezing or shortness of breath. 75 mL 2   albuterol  (VENTOLIN  HFA) 108 (90 Base) MCG/ACT inhaler Inhale 2 puffs into the lungs every 6 (six) hours as needed for wheezing or shortness of breath. 18 g 2   aspirin   EC 81 MG EC tablet Take 1 tablet (81 mg total) by mouth daily. 30 tablet 12   cetirizine  (ZYRTEC ) 10 MG tablet Take 1 tablet (10 mg total) by mouth daily. (Patient taking differently: Take 10 mg by mouth at bedtime.) 90 tablet 2   diclofenac  Sodium (VOLTAREN ) 1 % GEL Apply 2 g topically 2 (two) times daily for 5 days.     ferrous sulfate  325 (65 FE) MG tablet Take 325 mg by mouth daily with breakfast.     losartan  (COZAAR ) 50 MG tablet Take 1 tablet (50 mg total) by mouth daily. 90 tablet 0   pantoprazole  (PROTONIX ) 40 MG tablet Take 1 tablet (40 mg total) by mouth daily at 6 (six) AM. 90 tablet 0   potassium chloride  SA (KLOR-CON  M) 20 MEQ tablet Take 2 tablets (40 mEq total) by mouth 3 (three) times daily for 15 days. 60 tablet 0   prochlorperazine  (COMPAZINE ) 10 MG tablet Take 1 tablet (10 mg total) by mouth every 6 (six) hours as needed for nausea or vomiting 30 tablet 1   SYSTANE ULTRA 0.4-0.3 % SOLN Place 1 drop into both eyes 3 (three) times daily as needed (for dryness).     thiamine (VITAMIN B1) 100 MG tablet Take 1 tablet (100 mg total) by mouth daily. 30 tablet 3   tiZANidine  (ZANAFLEX ) 4 MG tablet Take 1 tablet (4 mg total) by mouth 2 (two) times daily as needed for muscle spasms. 60 tablet 3   carvedilol (COREG) 3.125 MG tablet Take 1 tablet (3.125 mg total) by mouth in the morning and evening with meals. (Patient not taking: Reported on 09/15/2024) 60 tablet 0   folic acid  (FOLVITE ) 1 MG tablet Take 1 tablet (1 mg total) by mouth 2 (two) times daily. 60 tablet 1   [Paused] rosuvastatin  (CRESTOR ) 20 MG tablet Take 1 tablet (20 mg total) by  mouth daily. (Patient not taking: Reported on 09/15/2024) 90 tablet 1   No current facility-administered medications for this visit.    REVIEW OF SYSTEMS:    10 Point review of Systems was done is negative except as noted above.   PHYSICAL EXAMINATION: ECOG PERFORMANCE STATUS: 2+  . Vitals:   09/15/24 0942 09/15/24 1000  BP: 124/87   Pulse: (!) 113 (!) 113  Resp: 18   Temp: 97.9 F (36.6 C)   SpO2: 100%     Filed Weights   09/15/24 0942  Weight: 218 lb (98.9 kg)   .Body mass index is 41.19 kg/m.   GENERAL:alert, in no acute distress and comfortable SKIN: no acute rashes, no significant lesions EYES: conjunctiva are pink and non-injected, sclera anicteric LUNGS: clear to auscultation b/l with normal respiratory effort HEART: regular rate & rhythm Extremity: bilateral trace pedal edema PSYCH: alert & oriented x 3 with fluent speech NEURO: no focal motor/sensory deficits   LABORATORY DATA:  I have reviewed the data as listed  .    Latest Ref Rng & Units 09/15/2024    9:30 AM 09/12/2024    7:20 AM 09/11/2024    7:09 AM  CBC  WBC 4.0 - 10.5 K/uL 3.2  2.5  2.3   Hemoglobin 12.0 - 15.0 g/dL 9.1  8.0  8.1   Hematocrit 36.0 - 46.0 % 27.4  25.3  25.1   Platelets 150 - 400 K/uL 52  36  37     .    Latest Ref Rng & Units 09/15/2024    9:30 AM 09/12/2024  7:20 AM 09/11/2024    7:09 AM  CMP  Glucose 70 - 99 mg/dL 874  896  883   BUN 6 - 20 mg/dL 9  12  13    Creatinine 0.44 - 1.00 mg/dL 9.49  9.55  9.55   Sodium 135 - 145 mmol/L 138  139  139   Potassium 3.5 - 5.1 mmol/L 4.8  3.6  3.8   Chloride 98 - 111 mmol/L 101  103  106   CO2 22 - 32 mmol/L 29  28  25    Calcium  8.9 - 10.3 mg/dL 86.8  88.1  88.5   Total Protein 6.5 - 8.1 g/dL 7.8     Total Bilirubin 0.0 - 1.2 mg/dL 0.4     Alkaline Phos 38 - 126 U/L 155     AST 15 - 41 U/L 19     ALT 0 - 44 U/L 12      02/05/2024 Bone Marrow Biopsy:       RADIOGRAPHIC STUDIES: I have personally reviewed the  radiological images as listed and agreed with the findings in the report. DG Chest Port 1 View if patient is in a treatment room. Result Date: 09/08/2024 CLINICAL DATA:  Anemia, dark stools, suspected sepsis EXAM: PORTABLE CHEST 1 VIEW COMPARISON:  01/30/2024 FINDINGS: Single frontal view of the chest demonstrates stable enlargement of the cardiac silhouette. No acute airspace disease, effusion, or pneumothorax. Multiple healed bilateral rib fractures are noted. The multiple small lytic lesions seen on prior PET scan are not as well visualized by x-ray. IMPRESSION: 1. No acute intrathoracic process. 2. Chronic healed bilateral rib fractures. The numerous small lytic lesions seen on recent PET scan are not as well visualized by x-ray. Electronically Signed   By: Ozell Daring M.D.   On: 09/08/2024 16:20     ASSESSMENT & PLAN:  CHAKIRA JACHIM is a 58 y.o. female who presents to the clinic for continued management of multiple myeloma.   #Multiple Myeloma --Bone marrow biopsy on 02/05/2024 showed Hypercellular bone marrow (60%) involved by plasma cell neoplasm (9 % plasma cells by manual aspirate differential, approximately 10% by CD138 immunohistochemical analysis, and lambda restricted by kappa/lambda in situ hybridization  --PET scan from 02/12/2024 showed extensive lytic lesions throughout the skeleton.  --Started Cycle 1, Day 1 of Daratumumab /Velcade /Dex on 03/12/2024 --Started Zometa  on 03/12/2024 q 28 days --M protein started with M protein 1.5 g/dL on 89/77/7974. Recommend to switch to Kyrpolis/Cytoxan/Dex.  PLAN: ---Due to start Kyrpolis/Cytoxan today --Labs from today were reviewed with patient and Dr. Onesimo. WBC 3.2, Hgb 9.1, Plt 52K --Plan to proceed with Kyprolis and hold Cytoxan today.  --RTC for weekly labs and treatment and 2 weeks for toxicity check.   #History of T6 compression fracture: --Likely related to multiple myeloma.  Mild pain at this time.  #Pancytopenia/folate  deficiency: --Recently hospitalized from 09/08/2024-09/12/2024 required 3 units of PRBC, FOBT negative.  --Recommend to start folic acid  1 mg BID and B complex. Prescription sent today  #History of VTE: --SAH with AC, cannot tolerate AC in order to consider Revlimid therapy  #Hypercalcemia: --Currently on vitamin B1/Thiaminne 100 mg PO daily --Receives Zometa  q 28 days, last received 10/11//2025. --Calcium  level today is 13.1. Gave 1 L of IV fluids and will arrange of Zometa  infusion tomorrow.  --Sent prescription for calcitonin nasal spray with directions to spray in each nostril once a day.    All of the patient's questions were answered with apparent satisfaction. The patient knows  to call the clinic with any problems, questions or concerns.   I have spent a total of 30 minutes minutes of face-to-face and non-face-to-face time, preparing to see the patient, performing a medically appropriate examination, counseling and educating the patient, documenting clinical information in the electronic health record, independently interpreting results and communicating results to the patient, and care coordination.   Johnston Police PA-C Dept of Hematology and Oncology [facility] Phone: [phone]

## 2024-09-15 NOTE — Patient Instructions (Signed)

## 2024-09-16 ENCOUNTER — Other Ambulatory Visit: Payer: Self-pay

## 2024-09-16 ENCOUNTER — Encounter: Payer: Self-pay | Admitting: Hematology

## 2024-09-16 ENCOUNTER — Other Ambulatory Visit (HOSPITAL_COMMUNITY): Payer: Self-pay

## 2024-09-16 MED ORDER — POTASSIUM CHLORIDE CRYS ER 20 MEQ PO TBCR
40.0000 meq | EXTENDED_RELEASE_TABLET | Freq: Two times a day (BID) | ORAL | 0 refills | Status: DC
  Filled 2024-09-16: qty 60, 15d supply, fill #0

## 2024-09-17 ENCOUNTER — Other Ambulatory Visit (HOSPITAL_COMMUNITY): Payer: Self-pay

## 2024-09-17 ENCOUNTER — Encounter (HOSPITAL_COMMUNITY): Admission: RE | Admit: 2024-09-17 | Source: Ambulatory Visit

## 2024-09-17 ENCOUNTER — Other Ambulatory Visit: Payer: Self-pay

## 2024-09-17 ENCOUNTER — Encounter: Payer: Self-pay | Admitting: Hematology

## 2024-09-19 ENCOUNTER — Other Ambulatory Visit: Payer: Self-pay

## 2024-09-21 ENCOUNTER — Encounter (INDEPENDENT_AMBULATORY_CARE_PROVIDER_SITE_OTHER): Payer: Self-pay | Admitting: Family Medicine

## 2024-09-21 ENCOUNTER — Ambulatory Visit (INDEPENDENT_AMBULATORY_CARE_PROVIDER_SITE_OTHER): Payer: Self-pay | Admitting: Family Medicine

## 2024-09-21 ENCOUNTER — Other Ambulatory Visit (HOSPITAL_COMMUNITY): Payer: Self-pay

## 2024-09-21 VITALS — BP 94/56 | HR 120 | Temp 98.3°F | Ht 61.5 in | Wt 214.0 lb

## 2024-09-21 DIAGNOSIS — Z6839 Body mass index (BMI) 39.0-39.9, adult: Secondary | ICD-10-CM

## 2024-09-21 DIAGNOSIS — I10 Essential (primary) hypertension: Secondary | ICD-10-CM

## 2024-09-21 DIAGNOSIS — Z6841 Body Mass Index (BMI) 40.0 and over, adult: Secondary | ICD-10-CM

## 2024-09-21 DIAGNOSIS — E46 Unspecified protein-calorie malnutrition: Secondary | ICD-10-CM

## 2024-09-21 DIAGNOSIS — E440 Moderate protein-calorie malnutrition: Secondary | ICD-10-CM | POA: Insufficient documentation

## 2024-09-21 DIAGNOSIS — C9000 Multiple myeloma not having achieved remission: Secondary | ICD-10-CM

## 2024-09-21 MED ORDER — LOSARTAN POTASSIUM 25 MG PO TABS
25.0000 mg | ORAL_TABLET | Freq: Every day | ORAL | 0 refills | Status: DC
  Filled 2024-09-21: qty 90, 90d supply, fill #0

## 2024-09-21 MED FILL — Dexamethasone Sodium Phosphate Inj 100 MG/10ML: INTRAMUSCULAR | Qty: 2 | Status: AC

## 2024-09-21 NOTE — Progress Notes (Signed)
 error

## 2024-09-21 NOTE — Progress Notes (Signed)
 Office: [phone]  /  Fax: [phone]  WEIGHT SUMMARY AND BIOMETRICS  Anthropometric Measurements Height: 5' 1.5 (1.562 m) Weight: 214 lb (97.1 kg) BMI (Calculated): 39.79 Weight at Last Visit: 241 lb Weight Lost Since Last Visit: 27 lb Weight Gained Since Last Visit: 0 Starting Weight: 324 lb Total Weight Loss (lbs): 110 lb (49.9 kg)   No data recorded Other Clinical Data Fasting: yes Labs: no Starting Date: 01/03/21    Chief Complaint: OBESITY    History of Present Illness Carla Hanson is a 58 year old female with obesity who presents for obesity treatment. She is accompanied by her mother.  She has experienced significant weight loss, losing 27 pounds over the last six weeks, coinciding with a recent hospitalization due to low hemoglobin levels, requiring four to five blood transfusions. She reports that she was not told the specific cause of her anemia by the hospital staff, but she thinks it may be related to her multiple myeloma.  Her calcium  levels have been elevated, and she has received treatments to lower them, though specifics are unknown. She was hospitalized twice since her last visit, once locally and once in [place], at [facility] , from October 10th to 15th, due to weakness and tiredness.  Nutritional intake has been challenging due to a lack of appetite, although there is some improvement. She is consuming more food, particularly vegetable soups with chicken, but struggles with larger meals like sandwiches. She is currently taking one protein shake daily. Her mother assists her in maintaining regular meals, and she typically consumes eggs and grits for breakfast.  Current medications include losartan  50 mg for blood pressure management. She was previously on carvedilol but discontinued it due to bone aches. She experiences lightheadedness when standing quickly.  She has a history of tremors since childhood, which have worsened  recently. The discontinuation of carvedilol may contribute to the increase in tremors, although it is uncertain if this is the cause.  She experiences tiredness due to poor sleep. No lightheadedness or dizziness at the time of the visit.      PHYSICAL EXAM:  Blood pressure (!) 94/56, pulse (!) 120, temperature 98.3 F (36.8 C), height 5' 1.5 (1.562 m), weight 214 lb (97.1 kg), SpO2 99%. Body mass index is 39.78 kg/m.  DIAGNOSTIC DATA REVIEWED:  BMET    Component Value Date/Time   NA 138 09/15/2024 0930   NA 145 (H) 02/24/2024 1217   K 4.8 09/15/2024 0930   CL 101 09/15/2024 0930   CO2 29 09/15/2024 0930   GLUCOSE 125 (H) 09/15/2024 0930   BUN 9 09/15/2024 0930   BUN 16 02/24/2024 1217   CREATININE 0.50 09/15/2024 0930   CREATININE 0.57 11/09/2014 1032   CALCIUM  13.1 (HH) 09/15/2024 0930   GFRNONAA >60 09/15/2024 0930   GFRNONAA >89 12/29/2013 0942   GFRAA 127 12/08/2019 1210   GFRAA >89 12/29/2013 0942   Lab Results  Component Value Date   HGBA1C 6.6 (H) 08/04/2024   HGBA1C 6.0 12/31/2012   Lab Results  Component Value Date   INSULIN  35.5 (H) 01/29/2024   INSULIN  77.3 (H) 01/03/2021   Lab Results  Component Value Date   TSH 0.736 01/29/2024   CBC    Component Value Date/Time   WBC 3.2 (L) 09/15/2024 0930   WBC 2.5 (L) 09/12/2024 0720   RBC 3.34 (L) 09/15/2024 0930   HGB 9.1 (L) 09/15/2024 0930   HGB 11.3 02/10/2024 1201   HCT 27.4 (  L) 09/15/2024 0930   HCT 34.7 02/10/2024 1201   PLT 52 (L) 09/15/2024 0930   PLT 342 02/10/2024 1201   MCV 82.0 09/15/2024 0930   MCV 84 02/10/2024 1201   MCH 27.2 09/15/2024 0930   MCHC 33.2 09/15/2024 0930   RDW 14.0 09/15/2024 0930   RDW 15.0 02/10/2024 1201   Iron Studies    Component Value Date/Time   IRON 110 09/09/2024 0115   IRON 27 02/10/2024 1201   TIBC 221 (L) 09/09/2024 0115   TIBC 245 (L) 02/10/2024 1201   FERRITIN >7,500 (H) 09/09/2024 0115   FERRITIN 384 (H) 02/10/2024 1201   IRONPCTSAT 50 (H)  09/09/2024 0115   IRONPCTSAT 11 (L) 02/10/2024 1201   Lipid Panel     Component Value Date/Time   CHOL 143 02/10/2024 1201   TRIG 113 02/10/2024 1201   HDL 45 02/10/2024 1201   CHOLHDL 3.2 02/10/2024 1201   CHOLHDL 6.3 (H) 01/08/2017 0947   VLDL 40 (H) 01/08/2017 0947   LDLCALC 77 02/10/2024 1201   Hepatic Function Panel     Component Value Date/Time   PROT 7.8 09/15/2024 0930   PROT 8.2 02/24/2024 1217   ALBUMIN 3.3 (L) 09/15/2024 0930   ALBUMIN 3.6 (L) 02/24/2024 1217   AST 19 09/15/2024 0930   ALT 12 09/15/2024 0930   ALKPHOS 155 (H) 09/15/2024 0930   BILITOT 0.4 09/15/2024 0930      Component Value Date/Time   TSH 0.736 01/29/2024 1553   Nutritional Lab Results  Component Value Date   VD25OH 43.8 08/04/2024   VD25OH 66.9 01/29/2024   VD25OH 70.4 07/29/2023     Assessment and Plan Assessment & Plan Morbid obesity with recent significant unintentional weight loss and protein-calorie malnutrition secondary to multiple myeloma and recent hospitalization Significant unintentional weight loss of 27 pounds over six weeks, likely due to multiple myeloma and recent hospitalization. Concerns about protein-calorie malnutrition due to inadequate nutrition intake. Emphasis on maintaining muscle mass and overall health rather than focusing solely on weight loss. Protein is essential for healing and maintaining muscle mass. - Increase protein intake through food and supplements. - Increase protein shakes to twice daily. - Incorporate high-protein foods such as chicken, eggs, and tuna. - Avoid milk and yogurt due to high calcium  content. - Consider high-protein oatmeal and microwave meals. - Ensure adequate hydration to prevent dehydration.  Essential hypertension, currently well controlled on reduced dose of losartan  Blood pressure is well controlled on losartan , but recent low readings and symptoms of lightheadedness suggest possible overmedication. Carvedilol was  discontinued by hematology due to bone aches. Plan to adjust losartan  dosage to prevent hypotension while maintaining control. - Reduced losartan  dose to 25 mg daily. - Instructed to monitor blood pressure and symptoms of hypotension. - Scheduled follow-up with primary care physician to reassess blood pressure management.     I personally spent a total of 44 minutes in the care of the patient today including preparing to see the patient, performing a medically appropriate evaluation of current problems, placing orders in the EMR, documenting clinical information in the EMR, and customized nutritional counseling for their specific health and social needs.  Follow up in 8-10 weeks after the holidays.  Taylinn was counseled on the importance of maintaining healthy lifestyle habits, including balanced nutrition, regular physical activity, and behavioral modifications, while taking antiobesity medication.  Patient verbalized understanding that medication is an adjunct to, not a replacement for, lifestyle changes and that the long-term success and weight maintenance  depend on continued adherence to these strategies.   Shanasia was informed of the importance of frequent follow up visits to maximize her success with intensive lifestyle modifications for her obesity and obesity related health conditions as recommended by USPSTF and CMS guidelines   Louann Penton, MD

## 2024-09-22 ENCOUNTER — Inpatient Hospital Stay

## 2024-09-22 ENCOUNTER — Encounter: Payer: Self-pay | Admitting: Hematology

## 2024-09-22 VITALS — BP 150/99 | HR 100 | Temp 98.5°F | Resp 18 | Wt 213.0 lb

## 2024-09-22 DIAGNOSIS — C9000 Multiple myeloma not having achieved remission: Secondary | ICD-10-CM

## 2024-09-22 DIAGNOSIS — Z5111 Encounter for antineoplastic chemotherapy: Secondary | ICD-10-CM | POA: Diagnosis not present

## 2024-09-22 LAB — CBC WITH DIFFERENTIAL (CANCER CENTER ONLY)
Abs Immature Granulocytes: 0.19 K/uL — ABNORMAL HIGH (ref 0.00–0.07)
Basophils Absolute: 0 K/uL (ref 0.0–0.1)
Basophils Relative: 0 %
Eosinophils Absolute: 0 K/uL (ref 0.0–0.5)
Eosinophils Relative: 0 %
HCT: 25.6 % — ABNORMAL LOW (ref 36.0–46.0)
Hemoglobin: 8.5 g/dL — ABNORMAL LOW (ref 12.0–15.0)
Immature Granulocytes: 3 %
Lymphocytes Relative: 64 %
Lymphs Abs: 3.5 K/uL (ref 0.7–4.0)
MCH: 27.1 pg (ref 26.0–34.0)
MCHC: 33.2 g/dL (ref 30.0–36.0)
MCV: 81.5 fL (ref 80.0–100.0)
Monocytes Absolute: 0.9 K/uL (ref 0.1–1.0)
Monocytes Relative: 16 %
Neutro Abs: 1 K/uL — ABNORMAL LOW (ref 1.7–7.7)
Neutrophils Relative %: 17 %
Platelet Count: 111 K/uL — ABNORMAL LOW (ref 150–400)
RBC: 3.14 MIL/uL — ABNORMAL LOW (ref 3.87–5.11)
RDW: 13.9 % (ref 11.5–15.5)
WBC Count: 5.6 K/uL (ref 4.0–10.5)
nRBC: 0 % (ref 0.0–0.2)

## 2024-09-22 LAB — CMP (CANCER CENTER ONLY)
ALT: 11 U/L (ref 0–44)
AST: 96 U/L — ABNORMAL HIGH (ref 15–41)
Albumin: 3.6 g/dL (ref 3.5–5.0)
Alkaline Phosphatase: 182 U/L — ABNORMAL HIGH (ref 38–126)
Anion gap: 14 (ref 5–15)
BUN: 10 mg/dL (ref 6–20)
CO2: 25 mmol/L (ref 22–32)
Calcium: 12.3 mg/dL — ABNORMAL HIGH (ref 8.9–10.3)
Chloride: 99 mmol/L (ref 98–111)
Creatinine: 0.62 mg/dL (ref 0.44–1.00)
GFR, Estimated: >60 mL/min (ref >60)
Glucose, Bld: 129 mg/dL — ABNORMAL HIGH (ref 70–99)
Potassium: 3.9 mmol/L (ref 3.5–5.1)
Sodium: 138 mmol/L (ref 135–145)
Total Bilirubin: 0.5 mg/dL (ref 0.0–1.2)
Total Protein: 7.7 g/dL (ref 6.5–8.1)

## 2024-09-22 MED ORDER — SODIUM CHLORIDE 0.9 % IV SOLN
20.0000 mg | Freq: Once | INTRAVENOUS | Status: AC
  Administered 2024-09-22: 20 mg via INTRAVENOUS
  Filled 2024-09-22: qty 20
  Filled 2024-09-22: qty 2

## 2024-09-22 MED ORDER — ACETAMINOPHEN 500 MG PO TABS
1000.0000 mg | ORAL_TABLET | Freq: Once | ORAL | Status: AC
  Administered 2024-09-22: 1000 mg via ORAL
  Filled 2024-09-22: qty 2

## 2024-09-22 MED ORDER — SODIUM CHLORIDE 0.9 % IV SOLN: INTRAVENOUS | Status: DC

## 2024-09-22 MED ORDER — PALONOSETRON HCL INJECTION 0.25 MG/5ML
0.2500 mg | Freq: Once | INTRAVENOUS | Status: AC
  Administered 2024-09-22: 0.25 mg via INTRAVENOUS
  Filled 2024-09-22: qty 5

## 2024-09-22 MED ORDER — SODIUM CHLORIDE 0.9 % IV SOLN
250.0000 mg/m2 | Freq: Once | INTRAVENOUS | Status: AC
  Administered 2024-09-22: 500 mg via INTRAVENOUS
  Filled 2024-09-22: qty 25

## 2024-09-22 MED ORDER — DIPHENHYDRAMINE HCL 25 MG PO CAPS
25.0000 mg | ORAL_CAPSULE | Freq: Once | ORAL | Status: AC
  Administered 2024-09-22: 25 mg via ORAL
  Filled 2024-09-22: qty 1

## 2024-09-22 MED ORDER — ZOLEDRONIC ACID 4 MG/100ML IV SOLN
4.0000 mg | Freq: Once | INTRAVENOUS | Status: AC
  Administered 2024-09-22: 4 mg via INTRAVENOUS
  Filled 2024-09-22: qty 100

## 2024-09-22 MED ORDER — DEXTROSE 5 % IV SOLN
27.0000 mg/m2 | Freq: Once | INTRAVENOUS | Status: AC
  Administered 2024-09-22: 60 mg via INTRAVENOUS
  Filled 2024-09-22: qty 30

## 2024-09-22 MED ORDER — SODIUM CHLORIDE 0.9 % IV SOLN: Freq: Once | INTRAVENOUS | Status: AC

## 2024-09-22 NOTE — Patient Instructions (Signed)
 [facility] - A DEPT OF [place]  Discharge Instructions: Thank you for choosing [facility] to provide your oncology and hematology care.   If you have a lab appointment with the Cancer Center, please go directly to the Cancer Center and check in at the registration area.   Wear comfortable clothing and clothing appropriate for easy access to any Portacath or PICC line.   We strive to give you quality time with your provider. You may need to reschedule your appointment if you arrive late (15 or more minutes).  Arriving late affects you and other patients whose appointments are after yours.  Also, if you miss three or more appointments without notifying the office, you may be dismissed from the clinic at the provider's discretion.      For prescription refill requests, have your pharmacy contact our office and allow 72 hours for refills to be completed.    Today you received the following chemotherapy and/or immunotherapy agents: Cytoxan, Kyprolis      To help prevent nausea and vomiting after your treatment, we encourage you to take your nausea medication as directed.  BELOW ARE SYMPTOMS THAT SHOULD BE REPORTED IMMEDIATELY: *FEVER GREATER THAN 100.4 F (38 C) OR HIGHER *CHILLS OR SWEATING *NAUSEA AND VOMITING THAT IS NOT CONTROLLED WITH YOUR NAUSEA MEDICATION *UNUSUAL SHORTNESS OF BREATH *UNUSUAL BRUISING OR BLEEDING *URINARY PROBLEMS (pain or burning when urinating, or frequent urination) *BOWEL PROBLEMS (unusual diarrhea, constipation, pain near the anus) TENDERNESS IN MOUTH AND THROAT WITH OR WITHOUT PRESENCE OF ULCERS (sore throat, sores in mouth, or a toothache) UNUSUAL RASH, SWELLING OR PAIN  UNUSUAL VAGINAL DISCHARGE OR ITCHING   Items with * indicate a potential emergency and should be followed up as soon as possible or go to the Emergency Department if any problems should occur.  Please show the CHEMOTHERAPY ALERT CARD or  IMMUNOTHERAPY ALERT CARD at check-in to the Emergency Department and triage nurse.  Should you have questions after your visit or need to cancel or reschedule your appointment, please contact [facility] - A DEPT OF [facility]  Dept: [phone]  and follow the prompts.  Office hours are 8:00 a.m. to 4:30 p.m. Monday - Friday. Please note that voicemails left after 4:00 p.m. may not be returned until the following business day.  We are closed weekends and major holidays. You have access to a nurse at all times for urgent questions. Please call the main number to the clinic Dept: [phone] and follow the prompts.   For any non-urgent questions, you may also contact your provider using MyChart. We now offer e-Visits for anyone 65 and older to request care online for non-urgent symptoms. For details visit [email or web address].   Also download the MyChart app! Go to the app store, search MyChart, open the app, select [facility], and log in with your MyChart username and password.  Cyclophosphamide Injection What is this medication? CYCLOPHOSPHAMIDE (sye kloe FOSS fa mide) treats some types of cancer. It works by slowing down the growth of cancer cells. This medicine may be used for other purposes; ask your health care provider or pharmacist if you have questions. COMMON BRAND NAME(S): Cyclophosphamide, Cytoxan, Neosar What should I tell my care team before I take this medication? They need to know if you have any of these conditions: Heart disease Irregular heartbeat or rhythm Infection Kidney problems Liver disease Low blood cell levels (white  cells, platelets, or red blood cells) Lung disease Previous radiation Trouble passing urine An unusual or allergic reaction to cyclophosphamide, other medications, foods, dyes, or preservatives Pregnant or trying to get pregnant Breast-feeding How should I use this medication? This medication is injected into  a vein. It is given by your care team in a hospital or clinic setting. Talk to your care team about the use of this medication in children. Special care may be needed. Overdosage: If you think you have taken too much of this medicine contact a poison control center or emergency room at once. NOTE: This medicine is only for you. Do not share this medicine with others. What if I miss a dose? Keep appointments for follow-up doses. It is important not to miss your dose. Call your care team if you are unable to keep an appointment. What may interact with this medication? Amphotericin B Amiodarone Azathioprine Certain antivirals for HIV or hepatitis Certain medications for blood pressure, such as enalapril, lisinopril , quinapril Cyclosporine Diuretics Etanercept Indomethacin Medications that relax muscles Metronidazole  Natalizumab Tamoxifen Warfarin This list may not describe all possible interactions. Give your health care provider a list of all the medicines, herbs, non-prescription drugs, or dietary supplements you use. Also tell them if you smoke, drink alcohol, or use illegal drugs. Some items may interact with your medicine. What should I watch for while using this medication? This medication may make you feel generally unwell. This is not uncommon as chemotherapy can affect healthy cells as well as cancer cells. Report any side effects. Continue your course of treatment even though you feel ill unless your care team tells you to stop. You may need blood work while you are taking this medication. This medication may increase your risk of getting an infection. Call your care team for advice if you get a fever, chills, sore throat, or other symptoms of a cold or flu. Do not treat yourself. Try to avoid being around people who are sick. Avoid taking medications that contain aspirin , acetaminophen , ibuprofen, naproxen, or ketoprofen unless instructed by your care team. These medications may  hide a fever. Be careful brushing or flossing your teeth or using a toothpick because you may get an infection or bleed more easily. If you have any dental work done, tell your dentist you are receiving this medication. Drink water or other fluids as directed. Urinate often, even at night. Some products may contain alcohol. Ask your care team if this medication contains alcohol. Be sure to tell all care teams you are taking this medicine. Certain medicines, like metronidazole  and disulfiram, can cause an unpleasant reaction when taken with alcohol. The reaction includes flushing, headache, nausea, vomiting, sweating, and increased thirst. The reaction can last from 30 minutes to several hours. Talk to your care team if you wish to become pregnant or think you might be pregnant. This medication can cause serious birth defects if taken during pregnancy and for 1 year after the last dose. A negative pregnancy test is required before starting this medication. A reliable form of contraception is recommended while taking this medication and for 1 year after the last dose. Talk to your care team about reliable forms of contraception. Do not father a child while taking this medication and for 4 months after the last dose. Use a condom during this time period. Do not breast-feed while taking this medication or for 1 week after the last dose. This medication may cause infertility. Talk to your care team if you are  concerned about your fertility. Talk to your care team about your risk of cancer. You may be more at risk for certain types of cancer if you take this medication. What side effects may I notice from receiving this medication? Side effects that you should report to your care team as soon as possible: Allergic reactions--skin rash, itching, hives, swelling of the face, lips, tongue, or throat Dry cough, shortness of breath or trouble breathing Heart failure--shortness of breath, swelling of the ankles,  feet, or hands, sudden weight gain, unusual weakness or fatigue Heart muscle inflammation--unusual weakness or fatigue, shortness of breath, chest pain, fast or irregular heartbeat, dizziness, swelling of the ankles, feet, or hands Heart rhythm changes--fast or irregular heartbeat, dizziness, feeling faint or lightheaded, chest pain, trouble breathing Infection--fever, chills, cough, sore throat, wounds that don't heal, pain or trouble when passing urine, general feeling of discomfort or being unwell Kidney injury--decrease in the amount of urine, swelling of the ankles, hands, or feet Liver injury--right upper belly pain, loss of appetite, nausea, light-colored stool, dark yellow or brown urine, yellowing skin or eyes, unusual weakness or fatigue Low red blood cell level--unusual weakness or fatigue, dizziness, headache, trouble breathing Low sodium level--muscle weakness, fatigue, dizziness, headache, confusion Red or dark brown urine Unusual bruising or bleeding Side effects that usually do not require medical attention (report to your care team if they continue or are bothersome): Hair loss Irregular menstrual cycles or spotting Loss of appetite Nausea Pain, redness, or swelling with sores inside the mouth or throat Vomiting This list may not describe all possible side effects. Call your doctor for medical advice about side effects. You may report side effects to FDA at 1-800-FDA-1088. Where should I keep my medication? This medication is given in a hospital or clinic. It will not be stored at home. NOTE: This sheet is a summary. It may not cover all possible information. If you have questions about this medicine, talk to your doctor, pharmacist, or health care provider.  2024 [employer] Standard (2022-03-08 00:00:00)

## 2024-09-23 ENCOUNTER — Other Ambulatory Visit: Payer: Self-pay

## 2024-09-23 ENCOUNTER — Other Ambulatory Visit: Payer: Self-pay | Admitting: Hematology

## 2024-09-23 ENCOUNTER — Encounter: Payer: Self-pay | Admitting: Hematology

## 2024-09-23 ENCOUNTER — Other Ambulatory Visit (HOSPITAL_COMMUNITY): Payer: Self-pay

## 2024-09-23 DIAGNOSIS — C9000 Multiple myeloma not having achieved remission: Secondary | ICD-10-CM

## 2024-09-23 LAB — PATHOLOGIST SMEAR REVIEW

## 2024-09-23 MED ORDER — POTASSIUM CHLORIDE CRYS ER 20 MEQ PO TBCR
40.0000 meq | EXTENDED_RELEASE_TABLET | Freq: Two times a day (BID) | ORAL | 0 refills | Status: AC
Start: 2024-09-23 — End: 2024-10-23
  Filled 2024-09-23 – 2024-09-28 (×2): qty 60, 15d supply, fill #0

## 2024-09-27 ENCOUNTER — Inpatient Hospital Stay

## 2024-09-27 ENCOUNTER — Other Ambulatory Visit: Payer: Self-pay

## 2024-09-27 ENCOUNTER — Inpatient Hospital Stay (HOSPITAL_BASED_OUTPATIENT_CLINIC_OR_DEPARTMENT_OTHER): Admitting: Hematology

## 2024-09-27 ENCOUNTER — Other Ambulatory Visit (HOSPITAL_COMMUNITY): Payer: Self-pay

## 2024-09-27 VITALS — BP 119/83 | HR 117 | Temp 98.1°F | Resp 18

## 2024-09-27 DIAGNOSIS — C9000 Multiple myeloma not having achieved remission: Secondary | ICD-10-CM

## 2024-09-27 DIAGNOSIS — Z5111 Encounter for antineoplastic chemotherapy: Secondary | ICD-10-CM | POA: Diagnosis not present

## 2024-09-27 LAB — CBC WITH DIFFERENTIAL (CANCER CENTER ONLY)
Abs Immature Granulocytes: 0.31 K/uL — ABNORMAL HIGH (ref 0.00–0.07)
Basophils Absolute: 0 K/uL (ref 0.0–0.1)
Basophils Relative: 1 %
Eosinophils Absolute: 0 K/uL (ref 0.0–0.5)
Eosinophils Relative: 0 %
HCT: 21.6 % — ABNORMAL LOW (ref 36.0–46.0)
Hemoglobin: 7.3 g/dL — ABNORMAL LOW (ref 12.0–15.0)
Immature Granulocytes: 6 %
Lymphocytes Relative: 55 %
Lymphs Abs: 3 K/uL (ref 0.7–4.0)
MCH: 27.5 pg (ref 26.0–34.0)
MCHC: 33.8 g/dL (ref 30.0–36.0)
MCV: 81.5 fL (ref 80.0–100.0)
Monocytes Absolute: 1.1 K/uL — ABNORMAL HIGH (ref 0.1–1.0)
Monocytes Relative: 21 %
Neutro Abs: 0.9 K/uL — ABNORMAL LOW (ref 1.7–7.7)
Neutrophils Relative %: 17 %
Platelet Count: 93 K/uL — ABNORMAL LOW (ref 150–400)
RBC: 2.65 MIL/uL — ABNORMAL LOW (ref 3.87–5.11)
RDW: 14.1 % (ref 11.5–15.5)
WBC Count: 5.3 K/uL (ref 4.0–10.5)
nRBC: 0.4 % — ABNORMAL HIGH (ref 0.0–0.2)

## 2024-09-27 LAB — SAMPLE TO BLOOD BANK

## 2024-09-27 LAB — PREPARE RBC (CROSSMATCH)

## 2024-09-27 LAB — CMP (CANCER CENTER ONLY)
ALT: 8 U/L (ref 0–44)
AST: 28 U/L (ref 15–41)
Albumin: 3.5 g/dL (ref 3.5–5.0)
Alkaline Phosphatase: 154 U/L — ABNORMAL HIGH (ref 38–126)
Anion gap: 12 (ref 5–15)
BUN: 9 mg/dL (ref 6–20)
CO2: 23 mmol/L (ref 22–32)
Calcium: 10.4 mg/dL — ABNORMAL HIGH (ref 8.9–10.3)
Chloride: 105 mmol/L (ref 98–111)
Creatinine: 0.56 mg/dL (ref 0.44–1.00)
GFR, Estimated: >60 mL/min (ref >60)
Glucose, Bld: 132 mg/dL — ABNORMAL HIGH (ref 70–99)
Potassium: 3.9 mmol/L (ref 3.5–5.1)
Sodium: 139 mmol/L (ref 135–145)
Total Bilirubin: 0.5 mg/dL (ref 0.0–1.2)
Total Protein: 7.8 g/dL (ref 6.5–8.1)

## 2024-09-27 NOTE — Progress Notes (Signed)
 HEMATOLOGY ONCOLOGY PROGRESS NOTE  Date of service: 09/27/2024  Patient Care Team: Anders Otto DASEN, MD as PCP - General (Family Medicine) Rosemarie [name] RAMAN, MD as Consulting Physician (Neurology) Wonda Cy BROCKS, RD as Dietitian (Family Medicine) Onesimo Emaline Brink, MD as Consulting Physician (Hematology)  CHIEF COMPLAINT/PURPOSE OF CONSULTATION: Follow-up for continued evaluation and management of multiple myeloma   HISTORY OF PRESENTING ILLNESS:  Carla Hanson is a wonderful 58 y.o. 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   Patient has a history of hypertension, dyslipidemia, COPD, history of PE/DVT, prediabetes, obesity who notes that she was working out in the gym and had significant back pain resulting in an x-ray that showed T6 compression fracture in January 2025.   She had a follow-up with [facility] clinic on 3/27 and reported symptoms of significant weakness dizziness lightheadedness and dehydration despite drinking a lot of water.  She also noted nausea but no emesis and constipation. Patient was seen in the emergency room and noted to have severe hypercalcemia with a calcium  level of 17.8 with an albumin of 3.8, total protein was elevated at 9.1 with an increased globulin gap, potassium was 2.7. 25-hydroxy vitamin D  levels were within normal limits at 66.9. TSH was normal at 0.736. PTH levels were appropriately suppressed at 6 which is suggestive of non hyperparathyroid hypercalcemia. CBC-on admission showed normal hemoglobin of 13.1 in the context of significant hemoconcentration.  This might be hiding her baseline anemia which might declare itself once the patient's volume status is replaced.  No other cytopenias. Patient notes no acute new focal bone pains.   Patient notes no fevers no chills no night sweats. No previous history of cancers. Her last mammogram was on  02/07/2023 and was within normal limits.  She is previously noted to have a benign lipoma in the right breast which was resected. Patient has been ex-smoker and smoked half to 1 pack a day for 35 years and quit in December 2017.   New focal symptoms. Patient has received 1 dose of Zometa  and IV fluids and calcium  levels today are down to 12 with a corrected calcium  between 13 and 14. She is receiving IV fluids potassium replacement phosphorus  replacement and calcitonin today.   Normal labs have been ordered and patient is pending whole-body skeletal survey.  24-hour urine collection for myeloma has been sent out.  SUMMARY OF ONCOLOGIC HISTORY: Oncology History  Multiple myeloma not having achieved remission (HCC)  02/02/2024 Initial Diagnosis   Multiple myeloma not having achieved remission (HCC)   03/12/2024 - 07/29/2024 Chemotherapy   Patient is on Treatment Plan : MYELOMA NEWLY DIAGNOSED TRANSPLANT CANDIDATE DaraVRd (Daratumumab  SQ) q21d x 6 Cycles (Induction/Consolidation)     09/15/2024 -  Chemotherapy   Patient is on Treatment Plan : MYELOMA RELAPSED/REFRACTORY KCd q28d x 6 cycles      INTERVAL HISTORY: Carla Hanson is a 58 y.o. female who is here today for continued evaluation and management of multiple myeloma. is ambulating with a wheelchair , accompanied by mother.  she was last seen by me on 08/25/2024; at the time she mentioned experiencing some weakness.    Saw Neomi Johnston DASEN, PA-C on 09/15/2024 and mentioned some improvement in her weakness, but not completely resolved, was having some SOBOE, and lower lip numbness for the past 2-3 weeks.  Today, she has been doing well. She reports that she has been  tolerating her treatment, though today she endorses feeling nauseated. Notes that she has increased her water intake. Her PET scan is scheduled for December 3rd.   Denies any fevers/chills, drenching night sweats, back pain, new bone pains, or leg swelling. She reports some  lightheaded/dizziness after siting up from laying down, which she attributes to dehydration. Additionally, alternating between feeling cold and heated.  She expresses significant frustration with her condition and the impact it has had on her body and life as she frequently misses out of social events, such as church. She reports that she has an appointment to begin physical therapy, which she is hopeful that it will improve her physical capabilities. Does take ASA 81 mg daily due to hx of blood clot that traveled from her leg to lung to brain - blood clot provoked by an ankle fracture.  She says that she was never told she was diabetic and thus has not been monitoring her blood sugars at home.  She states that her food and water intake has been well. Notes experiencing diarrhea after using a nutritional shake supplement.   REVIEW OF SYSTEMS:   10 Point review of systems of done and is negative except as noted above.  MEDICAL HISTORY Past Medical History:  Diagnosis Date   Alcohol  abuse    Anxiety    Back pain    Bipolar disorder (HCC)    Bronchitis    COPD exacerbation (HCC) 05/10/2016   Drug use    Edema 02/24/2024   History of blood clots    Hyperlipidemia    Hypertension    Influenza A 12/01/2018   Joint pain    Left leg DVT (HCC) 09/29/2013   Lipoma    Abdomen   LIPOMA 01/20/2008   Obesity    Occasional tremors 05/12/2019   Osteoarthritis of right knee    Other fatigue    Painful menstrual periods 01/05/2012   Pneumonia    Prediabetes    Seizure (HCC)    Shortness of breath on exertion    Stroke (HCC)    Tobacco abuse     SURGICAL HISTORY Past Surgical History:  Procedure Laterality Date   CESAREAN SECTION     CYSTECTOMY     LIPOMA EXCISION  03/2011   ORIF ANKLE FRACTURE  03/13/2012   Procedure: OPEN REDUCTION INTERNAL FIXATION (ORIF) ANKLE FRACTURE;  Surgeon: Lynwood FORBES Better, MD;  Location: [facility];  Service: Orthopedics;  Laterality: Right;   TUBAL LIGATION       SOCIAL HISTORY Social History   Tobacco Use   Smoking status: Former    Current packs/day: 0.00    Average packs/day: 0.5 packs/day for 35.0 years (17.5 ttl pk-yrs)    Types: Cigarettes    Start date: 11/04/1981    Quit date: 10/2016    Years since quitting: 7.9   Smokeless tobacco: Never  Vaping Use   Vaping status: Never Used  Substance Use Topics   Alcohol  use: No    Alcohol /week: 0.0 standard drinks of alcohol     Comment: Quit 10/2016   Drug use: Yes    Types: Marijuana    Social History   Social History Narrative   Unemployed, 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  Considering nursing.    Lives at home alone   Right-handed   Caffeine: not much    SOCIAL DRIVERS OF HEALTH SDOH Screenings   Food Insecurity: No Food Insecurity (  09/08/2024)  Housing: Low Risk  (09/08/2024)  Transportation Needs: No Transportation Needs (09/08/2024)  Utilities: Not At Risk (09/08/2024)  Depression (PHQ2-9): Low Risk  (09/08/2024)  Financial Resource Strain: Low Risk  (05/14/2024)  Physical Activity: Insufficiently Active (05/14/2024)  Social Connections: Moderately Integrated (05/14/2024)  Stress: No Stress Concern Present (05/14/2024)  Tobacco Use: Medium Risk (09/21/2024)     FAMILY HISTORY Family History  Problem Relation Age of Onset   Other Mother        High blood pressure runs in the family   Obesity Mother    Asthma Father    Heart attack Father    Obesity Father    Diabetes Maternal Aunt    Breast cancer Neg Hx      ALLERGIES: is allergic to latuda  [lurasidone ].  MEDICATIONS  Current Outpatient Medications  Medication Sig Dispense Refill   acetaminophen  (TYLENOL ) 325 MG tablet Take 2 tablets (650 mg total) by mouth every 6 (six) hours  if needed for mild pain (1-3) for up to 5 days. (Patient taking differently: Take 325-650 mg by mouth every 6 (six) hours as needed for mild pain  (pain score 1-3) (or headaches).) 30 tablet 0   acyclovir  (ZOVIRAX ) 400 MG tablet Take 1 tablet (400 mg total) by mouth 2 (two) times daily. 60 tablet 5   albuterol  (PROVENTIL ) (2.5 MG/3ML) 0.083% nebulizer solution Take 3 mLs (2.5 mg total) by nebulization every 6 (six) hours as needed for wheezing or shortness of breath. 75 mL 2   albuterol  (VENTOLIN  HFA) 108 (90 Base) MCG/ACT inhaler Inhale 2 puffs into the lungs every 6 (six) hours as needed for wheezing or shortness of breath. 18 g 2   aspirin  EC 81 MG EC tablet Take 1 tablet (81 mg total) by mouth daily. 30 tablet 12   calcitonin, salmon, (MIACALCIN /FORTICAL) 200 UNIT/ACT nasal spray Place 1 spray into alternate nostrils daily. 3.7 mL 12   carvedilol  (COREG ) 3.125 MG tablet Take 1 tablet (3.125 mg total) by mouth in the morning and evening with meals. (Patient not taking: Reported on 09/21/2024) 60 tablet 0   cetirizine  (ZYRTEC ) 10 MG tablet Take 1 tablet (10 mg total) by mouth daily. (Patient taking differently: Take 10 mg by mouth at bedtime.) 90 tablet 2   ferrous sulfate  325 (65 FE) MG tablet Take 325 mg by mouth daily with breakfast.     folic acid  (FOLVITE ) 1 MG tablet Take 1 tablet (1 mg total) by mouth 2 (two) times daily. 60 tablet 1   losartan  (COZAAR ) 25 MG tablet Take 1 tablet (25 mg total) by mouth daily. 90 tablet 0   pantoprazole  (PROTONIX ) 40 MG tablet Take 1 tablet (40 mg total) by mouth daily at 6 (six) AM. 90 tablet 0   potassium chloride  SA (KLOR-CON  M) 20 MEQ tablet Take 2 tablets (40 mEq total) by mouth 2 (two) times daily. 60 tablet 0   prochlorperazine  (COMPAZINE ) 10 MG tablet Take 1 tablet (10 mg total) by mouth every 6 (six) hours as needed for nausea or vomiting 30 tablet 1   [Paused] rosuvastatin  (CRESTOR ) 20 MG tablet Take 1 tablet (20 mg total) by mouth daily. 90 tablet 1   SYSTANE ULTRA 0.4-0.3 % SOLN Place 1 drop into both eyes 3 (three) times daily as needed (for dryness).     thiamine  (VITAMIN B1) 100 MG tablet  Take 1 tablet (100 mg total) by mouth daily. 30 tablet 3   tiZANidine  (ZANAFLEX ) 4 MG tablet Take 1 tablet (4  mg total) by mouth 2 (two) times daily as needed for muscle spasms. 60 tablet 3   No current facility-administered medications for this visit.    PHYSICAL EXAMINATION: ECOG PERFORMANCE STATUS: 2 - Symptomatic, <50% confined to bed VITALS: Vitals:   09/27/24 1521 09/27/24 1530  BP: (!) 149/90 119/83  Pulse: (!) 117   Resp: 18   Temp: 98.1 F (36.7 C)   SpO2: 99%    GENERAL: alert, in no acute distress and comfortable SKIN: no acute rashes, no significant lesions EYES: conjunctiva are pink and non-injected, sclera anicteric OROPHARYNX: MMM, no exudates, no oropharyngeal erythema or ulceration NECK: supple, no JVD LYMPH:  no palpable lymphadenopathy in the cervical, axillary or inguinal regions LUNGS: clear to auscultation b/l with normal respiratory effort HEART: regular rate & rhythm ABDOMEN:  normoactive bowel sounds , non tender, not distended, no hepatosplenomegaly Extremity: no pedal edema PSYCH: alert & oriented x 3 with fluent speech, (+) tearful NEURO: no focal motor/sensory deficits  LABORATORY DATA:   I have reviewed the data as listed     Latest Ref Rng & Units 09/27/2024    2:12 PM 09/22/2024   12:28 PM 09/15/2024    9:30 AM  CBC EXTENDED  WBC 4.0 - 10.5 K/uL 5.3  5.6  3.2   RBC 3.87 - 5.11 MIL/uL 2.65  3.14  3.34   Hemoglobin 12.0 - 15.0 g/dL 7.3  8.5  9.1   HCT 63.9 - 46.0 % 21.6  25.6  27.4   Platelets 150 - 400 K/uL 93  111  52   NEUT# 1.7 - 7.7 K/uL 0.9  1.0  0.6   Lymph# 0.7 - 4.0 K/uL 3.0  3.5  2.0        Latest Ref Rng & Units 09/27/2024    2:12 PM 09/22/2024   12:28 PM 09/15/2024    9:30 AM  CMP  Glucose 70 - 99 mg/dL 867  870  874   BUN 6 - 20 mg/dL 9  10  9    Creatinine 0.44 - 1.00 mg/dL 9.43  9.37  9.49   Sodium 135 - 145 mmol/L 139  138  138   Potassium 3.5 - 5.1 mmol/L 3.9  3.9  4.8   Chloride 98 - 111 mmol/L 105  99  101    CO2 22 - 32 mmol/L 23  25  29    Calcium  8.9 - 10.3 mg/dL 89.5  87.6  86.8   Total Protein 6.5 - 8.1 g/dL 7.8  7.7  7.8   Total Bilirubin 0.0 - 1.2 mg/dL 0.5  0.5  0.4   Alkaline Phos 38 - 126 U/L 154  182  155   AST 15 - 41 U/L 28  96  19   ALT 0 - 44 U/L 8  11  12     MULTIPLE MYELOMA & KAPPA/LAMBDA LIGHT CHAINS 04/2024 - 08/2024    02/05/2024 Bone Marrow Biopsy:       RADIOGRAPHIC STUDIES: I have personally reviewed the radiological images as listed and agreed with the findings in the report. DG Chest Port 1 View if patient is in a treatment room. Result Date: 09/08/2024 CLINICAL DATA:  Anemia, dark stools, suspected sepsis EXAM: PORTABLE CHEST 1 VIEW COMPARISON:  01/30/2024 FINDINGS: Single frontal view of the chest demonstrates stable enlargement of the cardiac silhouette. No acute airspace disease, effusion, or pneumothorax. Multiple healed bilateral rib fractures are noted. The multiple small lytic lesions seen on prior PET scan are not as well  visualized by x-ray. IMPRESSION: 1. No acute intrathoracic process. 2. Chronic healed bilateral rib fractures. The numerous small lytic lesions seen on recent PET scan are not as well visualized by x-ray. Electronically Signed   By: Ozell Daring M.D.   On: 09/08/2024 16:20     ASSESSMENT & PLAN:  58 y.o. female with  1) IgG lambda multiple myeloma Myeloma FISH panel with no notable mutations Status post severely elevated calcium  level 17.8 on admission now resolved   #2 hx dehydration and volume contraction due to hypercalcemia.   #3  Hypertension  #4 dyslipidemia  #5 COPD  #6 prediabetes  #7 multiple osseous lesions concerning for multiple myeloma.  Also noted to have left femoral intertrochanteric lesion.  #8 history of T6 compression fracture was likely related to multiple myeloma.  No significant discomfort at this time.  #10 h/o VTE in the past.and SAH with anticoagulation-- so cannot tolerate anticoagulation or ASA to be  able to consider Revlimid  PLAN: - Discussed lab results on 09/27/2024 in detail with patient: CBC showed WBC of 5.3K, Hemoglobin of 7.3 decreased from 8.5, and PLTs of 93K decreased from 111K.  Worsening anemia, which would contribute to her fatigue.   Offered IV Iron as her HGB is almost 7.0 CMP with Calcium  10.4 decreased from 12.3, AST 28 decreased from 96.   Reminded patient to hydrate adequately to assist with Calcium  detoxification.  - Obtain Myeloma panel after next infusion.  - Keep PET scan appointment.  - Discussed duration of treatment is dependent on her tolerance for her medication.  Hesitant to switch her to Revlimid due to the increased risk of blood clot formation, and her intolerance towards any anticoagulant except for ASA due to her history of subarachnoid bleed attributed to Xarelto  usage.   FOLLOW-UP  PRBC transfusion prn to maintain hgb>7.5 Continye KCd per integrated scheduling PET/CT -- patient again reminded to reschedule this   The total time spent in the appointment was 30 minutes* .  All of the patient's questions were answered and the patient knows to call the clinic with any problems, questions, or concerns.  Emaline Saran MD MS AAHIVMS [facility] Hematology/Oncology Physician [facility]  *Total Encounter Time as defined by the Centers for Medicare and Medicaid Services includes, in addition to the face-to-face time of a patient visit (documented in the note above) non-face-to-face time: obtaining and reviewing outside history, ordering and reviewing medications, tests or procedures, care coordination (communications with other health care professionals or caregivers) and documentation in the medical record.  I,Emily Lagle,acting as a neurosurgeon for Emaline Saran, MD.,have documented all relevant documentation on the behalf of Emaline Saran, MD,as directed by  Emaline Saran, MD while in the presence of Emaline Saran, MD.  I have reviewed the above  documentation for accuracy and completeness, and I agree with the above.  Samie Reasons, MD

## 2024-09-28 ENCOUNTER — Other Ambulatory Visit (HOSPITAL_COMMUNITY): Payer: Self-pay

## 2024-09-28 ENCOUNTER — Inpatient Hospital Stay: Admitting: Hematology

## 2024-09-28 ENCOUNTER — Inpatient Hospital Stay

## 2024-09-28 ENCOUNTER — Other Ambulatory Visit: Payer: Self-pay

## 2024-09-28 VITALS — BP 148/94 | HR 95 | Temp 98.1°F | Resp 18

## 2024-09-28 DIAGNOSIS — Z5111 Encounter for antineoplastic chemotherapy: Secondary | ICD-10-CM | POA: Diagnosis not present

## 2024-09-28 DIAGNOSIS — C9000 Multiple myeloma not having achieved remission: Secondary | ICD-10-CM

## 2024-09-28 MED ORDER — OXYCODONE HCL 5 MG PO TABS
5.0000 mg | ORAL_TABLET | Freq: Once | ORAL | Status: AC
  Administered 2024-09-28: 5 mg via ORAL
  Filled 2024-09-28: qty 1

## 2024-09-28 MED ORDER — SODIUM CHLORIDE 0.9 % IV SOLN
250.0000 mg/m2 | Freq: Once | INTRAVENOUS | Status: AC
  Administered 2024-09-28: 500 mg via INTRAVENOUS
  Filled 2024-09-28: qty 25

## 2024-09-28 MED ORDER — DEXTROSE 5 % IV SOLN
36.0000 mg/m2 | Freq: Once | INTRAVENOUS | Status: AC
  Administered 2024-09-28: 80 mg via INTRAVENOUS
  Filled 2024-09-28: qty 30

## 2024-09-28 MED ORDER — SODIUM CHLORIDE 0.9 % IV SOLN: INTRAVENOUS | Status: DC

## 2024-09-28 MED ORDER — ACETAMINOPHEN 500 MG PO TABS
1000.0000 mg | ORAL_TABLET | Freq: Once | ORAL | Status: AC
  Administered 2024-09-28: 1000 mg via ORAL
  Filled 2024-09-28: qty 2

## 2024-09-28 MED ORDER — DIPHENHYDRAMINE HCL 25 MG PO CAPS
25.0000 mg | ORAL_CAPSULE | Freq: Once | ORAL | Status: AC
  Administered 2024-09-28: 25 mg via ORAL
  Filled 2024-09-28: qty 1

## 2024-09-28 MED ORDER — SODIUM CHLORIDE 0.9 % IV SOLN: Freq: Once | INTRAVENOUS | Status: AC

## 2024-09-28 MED ORDER — SODIUM CHLORIDE 0.9 % IV SOLN
20.0000 mg | Freq: Once | INTRAVENOUS | Status: AC
  Administered 2024-09-28: 20 mg via INTRAVENOUS
  Filled 2024-09-28: qty 20

## 2024-09-28 MED ORDER — PALONOSETRON HCL INJECTION 0.25 MG/5ML
0.2500 mg | Freq: Once | INTRAVENOUS | Status: AC
  Administered 2024-09-28: 0.25 mg via INTRAVENOUS
  Filled 2024-09-28: qty 5

## 2024-09-28 MED ORDER — METHYLPREDNISOLONE SODIUM SUCC 125 MG IJ SOLR: 40.0000 mg | Freq: Once | INTRAMUSCULAR | Status: DC

## 2024-09-28 MED ORDER — SODIUM CHLORIDE 0.9% IV SOLUTION
250.0000 mL | INTRAVENOUS | Status: DC
  Administered 2024-09-28: 250 mL via INTRAVENOUS

## 2024-09-28 MED ORDER — ACETAMINOPHEN 325 MG PO TABS: 650.0000 mg | ORAL_TABLET | Freq: Once | ORAL | Status: DC

## 2024-09-28 NOTE — Patient Instructions (Signed)
 [facility] - A DEPT OF [facility]  Discharge Instructions: Thank you for choosing [facility] to provide your oncology and hematology care.   If you have a lab appointment with the Cancer Center, please go directly to the Cancer Center and check in at the registration area.   Wear comfortable clothing and clothing appropriate for easy access to any Portacath or PICC line.   We strive to give you quality time with your provider. You may need to reschedule your appointment if you arrive late (15 or more minutes).  Arriving late affects you and other patients whose appointments are after yours.  Also, if you miss three or more appointments without notifying the office, you may be dismissed from the clinic at the provider's discretion.      For prescription refill requests, have your pharmacy contact our office and allow 72 hours for refills to be completed.    Today you received the following chemotherapy and/or immunotherapy agents cytoxan  and kyprolis  and blood       To help prevent nausea and vomiting after your treatment, we encourage you to take your nausea medication as directed.  BELOW ARE SYMPTOMS THAT SHOULD BE REPORTED IMMEDIATELY: *FEVER GREATER THAN 100.4 F (38 C) OR HIGHER *CHILLS OR SWEATING *NAUSEA AND VOMITING THAT IS NOT CONTROLLED WITH YOUR NAUSEA MEDICATION *UNUSUAL SHORTNESS OF BREATH *UNUSUAL BRUISING OR BLEEDING *URINARY PROBLEMS (pain or burning when urinating, or frequent urination) *BOWEL PROBLEMS (unusual diarrhea, constipation, pain near the anus) TENDERNESS IN MOUTH AND THROAT WITH OR WITHOUT PRESENCE OF ULCERS (sore throat, sores in mouth, or a toothache) UNUSUAL RASH, SWELLING OR PAIN  UNUSUAL VAGINAL DISCHARGE OR ITCHING   Items with * indicate a potential emergency and should be followed up as soon as possible or go to the Emergency Department if any problems should occur.  Please show the CHEMOTHERAPY ALERT  CARD or IMMUNOTHERAPY ALERT CARD at check-in to the Emergency Department and triage nurse.  Should you have questions after your visit or need to cancel or reschedule your appointment, please contact [facility] - A DEPT OF [facility]  Dept: [phone]  and follow the prompts.  Office hours are 8:00 a.m. to 4:30 p.m. Monday - Friday. Please note that voicemails left after 4:00 p.m. may not be returned until the following business day.  We are closed weekends and major holidays. You have access to a nurse at all times for urgent questions. Please call the main number to the clinic Dept: [phone] and follow the prompts.   For any non-urgent questions, you may also contact your provider using MyChart. We now offer e-Visits for anyone 51 and older to request care online for non-urgent symptoms. For details visit [email or web address].   Also download the MyChart app! Go to the app store, search MyChart, open the app, select [facility], and log in with your MyChart username and password.

## 2024-09-28 NOTE — Progress Notes (Signed)
 Order entered for oxycodone  5mg  x1 per Dr. Onesimo.  Tuvia Woodrick, PharmD, MBA

## 2024-09-29 ENCOUNTER — Other Ambulatory Visit: Payer: Self-pay

## 2024-09-29 ENCOUNTER — Ambulatory Visit: Admitting: Rehabilitation

## 2024-09-29 DIAGNOSIS — C9000 Multiple myeloma not having achieved remission: Secondary | ICD-10-CM

## 2024-09-29 LAB — TYPE AND SCREEN
ABO/RH(D): A POS
Antibody Screen: NEGATIVE
Unit division: 0

## 2024-09-29 LAB — BPAM RBC
Blood Product Expiration Date: 202512012359
ISSUE DATE / TIME: 202511251216
Unit Type and Rh: 202512012359
Unit Type and Rh: 600

## 2024-09-29 NOTE — Therapy (Deleted)
 OUTPATIENT PHYSICAL THERAPY LOWER EXTREMITY EVALUATION   Patient Name: Carla Hanson MRN: [id number] DOB:Mar 31, 1966, 58 y.o., female Today's Date: 09/29/2024  END OF SESSION:   Past Medical History:  Diagnosis Date   Alcohol  abuse    Anxiety    Back pain    Bipolar disorder (HCC)    Bronchitis    COPD exacerbation (HCC) 05/10/2016   Drug use    Edema 02/24/2024   History of blood clots    Hyperlipidemia    Hypertension    Influenza A 12/01/2018   Joint pain    Left leg DVT (HCC) 09/29/2013   Lipoma    Abdomen   LIPOMA 01/20/2008   Obesity    Occasional tremors 05/12/2019   Osteoarthritis of right knee    Other fatigue    Painful menstrual periods 01/05/2012   Pneumonia    Prediabetes    Seizure (HCC)    Shortness of breath on exertion    Stroke (HCC)    Tobacco abuse    Past Surgical History:  Procedure Laterality Date   CESAREAN SECTION     CYSTECTOMY     LIPOMA EXCISION  03/2011   ORIF ANKLE FRACTURE  03/13/2012   Procedure: OPEN REDUCTION INTERNAL FIXATION (ORIF) ANKLE FRACTURE;  Surgeon: Lynwood FORBES Better, MD;  Location: [facility];  Service: Orthopedics;  Laterality: Right;   TUBAL LIGATION     Patient Active Problem List   Diagnosis Date Noted   Moderate protein-calorie malnutrition 09/21/2024   Chemotherapy-induced neutropenia 09/09/2024   Folate deficiency 09/09/2024   SIRS (systemic inflammatory response syndrome) (HCC) 09/08/2024   Pancytopenia (HCC) 09/08/2024   Symptomatic anemia 09/08/2024   History of compression fracture of spine 04/12/2024   Electrolyte abnormality 02/10/2024   Edema leg 02/10/2024   Hypercalcemia of malignancy 02/02/2024   Multiple myeloma not having achieved remission (HCC) 02/02/2024   Hypokalemia 01/30/2024   BMI 40.0-44.9, adult (HCC) 12/18/2022   Chronic pain of both shoulders 09/05/2022   Right knee pain 07/18/2022   Vitamin D  deficiency 12/19/2020   Palpitation 12/03/2019   History of intracranial hemorrhage  08/02/2019   History of DVT of lower extremity 08/02/2019   History of seizure 08/02/2019   COPD, moderate (HCC) 12/11/2018   History of hemorrhagic cerebrovascular accident (CVA) without residual deficits 08/13/2017   Iron deficiency anemia 12/12/2016   Pre-diabetes 03/02/2013   History of pulmonary embolism 05/05/2012   Former tobacco use 08/23/2009   Hyperlipidemia 04/30/2007   Essential hypertension 01/01/2007    PCP: Otto Fairly, MD  REFERRING PROVIDER: Porter Saran, MD  REFERRING DIAG: C90.00 (ICD-10-CM) - Multiple myeloma not having achieved remission (HCC)   THERAPY DIAG:  No diagnosis found.  Rationale for Evaluation and Treatment: Rehabilitation  ONSET DATE: 02/05/24  SUBJECTIVE:   SUBJECTIVE STATEMENT: ***  PERTINENT HISTORY: PET scan 02/12/24 showing extensive lytic lesions throughout the skeleton. Started chemo 03/12/24. Hx of T6 compression fracture. Hospitalized from 09/08/24-09/12/24 due to anemia/pancytopenia,  PAIN:  Are you having pain? {OPRCPAIN:27236}  PRECAUTIONS: {Therapy precautions:24002}  RED FLAGS: {PT Red Flags:29287}   WEIGHT BEARING RESTRICTIONS: {Yes ***/No:24003}  FALLS:  Has patient fallen in last 6 months? {fallsyesno:27318}  LIVING ENVIRONMENT: Lives with: {[facility] lives with:25569::lives with their family} Lives in: {Lives in:25570} Stairs: {opstairs:27293} Has following equipment at home: {Assistive devices:23999}  OCCUPATION: ***  PLOF: {PLOF:24004}  PATIENT GOALS: ***  NEXT MD VISIT: ***  OBJECTIVE:  Note: Objective measures were completed at Evaluation unless otherwise noted.  DIAGNOSTIC FINDINGS: ***  PATIENT SURVEYS:  {rehab surveys:24030}  COGNITION: Overall cognitive status: {cognition:24006}     SENSATION: {sensation:27233}  EDEMA:  {edema:24020}  MUSCLE LENGTH: Hamstrings: Right *** deg; Left *** deg Debby test: Right *** deg; Left *** deg  POSTURE: {posture:25561}  PALPATION: ***  LOWER  EXTREMITY ROM:  {AROM/PROM:27142} ROM Right eval Left eval  Hip flexion    Hip extension    Hip abduction    Hip adduction    Hip internal rotation    Hip external rotation    Knee flexion    Knee extension    Ankle dorsiflexion    Ankle plantarflexion    Ankle inversion    Ankle eversion     (Blank rows = not tested)  LOWER EXTREMITY MMT:  MMT Right eval Left eval  Hip flexion    Hip extension    Hip abduction    Hip adduction    Hip internal rotation    Hip external rotation    Knee flexion    Knee extension    Ankle dorsiflexion    Ankle plantarflexion    Ankle inversion    Ankle eversion     (Blank rows = not tested)  LOWER EXTREMITY SPECIAL TESTS:  {LEspecialtests:26242}  FUNCTIONAL TESTS:  30 seconds chair stand test Timed up and go (TUG): *** 6 minute walk test: ***  GAIT: Distance walked: *** Assistive device utilized: {Assistive devices:23999} Level of assistance: {Levels of assistance:24026} Comments: ***                                                                                                                                TREATMENT DATE: ***    PATIENT EDUCATION:  Education details: *** Person educated: {Person educated:25204} Education method: {Education Method:25205} Education comprehension: {Education Comprehension:25206}  HOME EXERCISE PROGRAM: ***  ASSESSMENT:  CLINICAL IMPRESSION: Patient is a *** y.o. *** who was seen today for physical therapy evaluation and treatment for ***.   OBJECTIVE IMPAIRMENTS: {opptimpairments:25111}.   ACTIVITY LIMITATIONS: {activitylimitations:27494}  PARTICIPATION LIMITATIONS: {participationrestrictions:25113}  PERSONAL FACTORS: {Personal factors:25162} are also affecting patient's functional outcome.   REHAB POTENTIAL: {rehabpotential:25112}  CLINICAL DECISION MAKING: {clinical decision making:25114}  EVALUATION COMPLEXITY: {Evaluation complexity:25115}   GOALS: Goals reviewed  with patient? {yes/no:20286}  SHORT TERM GOALS: Target date: *** *** Baseline: Goal status: INITIAL  2.  *** Baseline:  Goal status: INITIAL  3.  *** Baseline:  Goal status: INITIAL  4.  *** Baseline:  Goal status: INITIAL  5.  *** Baseline:  Goal status: INITIAL  6.  *** Baseline:  Goal status: INITIAL  LONG TERM GOALS: Target date: ***  *** Baseline:  Goal status: INITIAL  2.  *** Baseline:  Goal status: INITIAL  3.  *** Baseline:  Goal status: INITIAL  4.  *** Baseline:  Goal status: INITIAL  5.  *** Baseline:  Goal status: INITIAL  6.  *** Baseline:  Goal status: INITIAL   PLAN:  PT FREQUENCY: {rehab frequency:25116}  PT DURATION: {rehab duration:25117}  PLANNED INTERVENTIONS: {rehab planned interventions:25118::97110-Therapeutic exercises,97530- Therapeutic [phone]- Neuromuscular re-education,97535- Self Rjmz,02859- Manual therapy,Patient/Family education}  PLAN FOR NEXT SESSION: ***   Larue Saddie SAUNDERS, PT 09/29/2024, 8:50 AM

## 2024-10-03 ENCOUNTER — Other Ambulatory Visit (HOSPITAL_COMMUNITY): Payer: Self-pay

## 2024-10-03 ENCOUNTER — Other Ambulatory Visit (INDEPENDENT_AMBULATORY_CARE_PROVIDER_SITE_OTHER): Payer: Self-pay | Admitting: Family Medicine

## 2024-10-03 ENCOUNTER — Other Ambulatory Visit: Payer: Self-pay | Admitting: Hematology

## 2024-10-03 ENCOUNTER — Encounter: Payer: Self-pay | Admitting: Hematology

## 2024-10-03 DIAGNOSIS — I10 Essential (primary) hypertension: Secondary | ICD-10-CM

## 2024-10-03 DIAGNOSIS — C9000 Multiple myeloma not having achieved remission: Secondary | ICD-10-CM

## 2024-10-04 ENCOUNTER — Other Ambulatory Visit: Payer: Self-pay

## 2024-10-04 ENCOUNTER — Telehealth: Payer: Self-pay

## 2024-10-04 ENCOUNTER — Other Ambulatory Visit: Payer: Self-pay | Admitting: Hematology

## 2024-10-04 ENCOUNTER — Encounter: Payer: Self-pay | Admitting: Hematology

## 2024-10-04 ENCOUNTER — Other Ambulatory Visit (HOSPITAL_COMMUNITY): Payer: Self-pay

## 2024-10-04 MED ORDER — LORAZEPAM 0.5 MG PO TABS
0.5000 mg | ORAL_TABLET | Freq: Once | ORAL | 0 refills | Status: DC | PRN
  Filled 2024-10-04: qty 2, 1d supply, fill #0

## 2024-10-04 NOTE — Telephone Encounter (Signed)
 I left voicemail for patient to return my call regarding new palliative care appointment for 10/08/2024.

## 2024-10-04 NOTE — Telephone Encounter (Signed)
 Pt phoned and left a request for medication to relax during her scheduled PET scan 10/07/24. Message sent to Dr Onesimo regarding request.

## 2024-10-05 ENCOUNTER — Ambulatory Visit: Admitting: Family Medicine

## 2024-10-06 ENCOUNTER — Inpatient Hospital Stay

## 2024-10-06 ENCOUNTER — Other Ambulatory Visit (HOSPITAL_COMMUNITY): Payer: Self-pay

## 2024-10-06 ENCOUNTER — Telehealth: Payer: Self-pay

## 2024-10-06 ENCOUNTER — Inpatient Hospital Stay: Admitting: Hematology

## 2024-10-06 NOTE — Telephone Encounter (Signed)
 Called and spoke with pt to get her scheduled for new palliative appt, no further needs at this time.

## 2024-10-07 ENCOUNTER — Encounter (HOSPITAL_COMMUNITY): Admission: RE | Admit: 2024-10-07

## 2024-10-07 NOTE — Therapy (Incomplete)
 OUTPATIENT PHYSICAL THERAPY ONCOLOGY EVALUATION  Patient Name: Carla Hanson MRN: [id number] DOB:[birth year], 58 y.o., female Today's Date: 10/07/2024  END OF SESSION:   Past Medical History:  Diagnosis Date   Alcohol  abuse    Anxiety    Back pain    Bipolar disorder (HCC)    Bronchitis    COPD exacerbation (HCC) 05/10/2016   Drug use    Edema 02/24/2024   History of blood clots    Hyperlipidemia    Hypertension    Influenza A 12/01/2018   Joint pain    Left leg DVT (HCC) 09/29/2013   Lipoma    Abdomen   LIPOMA 01/20/2008   Obesity    Occasional tremors 05/12/2019   Osteoarthritis of right knee    Other fatigue    Painful menstrual periods 01/05/2012   Pneumonia    Prediabetes    Seizure (HCC)    Shortness of breath on exertion    Stroke (HCC)    Tobacco abuse    Past Surgical History:  Procedure Laterality Date   CESAREAN SECTION     CYSTECTOMY     LIPOMA EXCISION  03/2011   ORIF ANKLE FRACTURE  03/13/2012   Procedure: OPEN REDUCTION INTERNAL FIXATION (ORIF) ANKLE FRACTURE;  Surgeon: Lynwood FORBES Better, MD;  Location: [facility];  Service: Orthopedics;  Laterality: Right;   TUBAL LIGATION     Patient Active Problem List   Diagnosis Date Noted   Moderate protein-calorie malnutrition 09/21/2024   Chemotherapy-induced neutropenia 09/09/2024   Folate deficiency 09/09/2024   Pancytopenia (HCC) 09/08/2024   History of compression fracture of spine 04/12/2024   Electrolyte abnormality 02/10/2024   Edema leg 02/10/2024   Hypercalcemia of malignancy 02/02/2024   Multiple myeloma not having achieved remission (HCC) 02/02/2024   Hypokalemia 01/30/2024   BMI 40.0-44.9, adult (HCC) 12/18/2022   Chronic pain of both shoulders 09/05/2022   Vitamin D  deficiency 12/19/2020   Palpitation 12/03/2019   History of intracranial hemorrhage 08/02/2019   History of DVT of lower extremity 08/02/2019   History of seizure 08/02/2019   COPD, moderate (HCC) 12/11/2018   History of  hemorrhagic cerebrovascular accident (CVA) without residual deficits 08/13/2017   Iron deficiency anemia 12/12/2016   Pre-diabetes 03/02/2013   History of pulmonary embolism 05/05/2012   Former tobacco use 08/23/2009   Hyperlipidemia 04/30/2007   Essential hypertension 01/01/2007    PCP: ***  REFERRING PROVIDER: Onesimo Emaline Brink, MD  REFERRING DIAG: C90.00 (ICD-10-CM) - Multiple myeloma not having achieved remission (HCC)  THERAPY DIAG:  No diagnosis found.  ONSET DATE: ***  Rationale for Evaluation and Treatment: Rehabilitation  SUBJECTIVE:  SUBJECTIVE STATEMENT:  PERTINENT HISTORY: IgG lambda multiple myeloma, Status post severely elevated calcium  level 17.8 on hospital admission 09/08/24- 09/12/24 now resolved, hx dehydration and volume contraction due to hypercalcemia, Hypertension, dyslipidemia, COPD, prediabetes, multiple osseous lesions concerning for multiple myeloma.  Also noted to have left femoral intertrochanteric lesion, history of T6 compression fracture was likely related to multiple myeloma, hx of LLE DVT, OA R knee, and stroke  PAIN:  Are you having pain? {yes/no:20286} NPRS scale: ***/10 Pain location: *** Pain orientation: {Pain Orientation:25161}  PAIN TYPE: {type:313116} Pain description: {PAIN DESCRIPTION:21022940}  Aggravating factors: *** Relieving factors: ***  PRECAUTIONS: {Therapy precautions:24002}  WEIGHT BEARING RESTRICTIONS: {Yes ***/No:24003}  FALLS:  Has patient fallen in last 6 months? {fallsyesno:27318}  LIVING ENVIRONMENT: Lives with: {[facility] lives with:25569::lives with their family} Lives in: {Lives in:25570} Stairs: {yes/no:20286}; {Stairs:24000} Has following equipment at home: {Assistive devices:23999}  OCCUPATION: ***  LEISURE: ***  HAND  DOMINANCE: {RIGHT/LEFT:21944}   PRIOR LEVEL OF FUNCTION: {PLOF:24004}  PATIENT GOALS: ***   OBJECTIVE: Note: Objective measures were completed at Evaluation unless otherwise noted.  COGNITION: Overall cognitive status: {cognition:24006}   PALPATION: ***  OBSERVATIONS / OTHER ASSESSMENTS: ***  SENSATION: Light touch: {intact/deficits:24005} Stereognosis: {intact/deficits:24005} Hot/Cold: {intact/deficits:24005} Proprioception: {intact/deficits:24005}  POSTURE: ***  UPPER EXTREMITY AROM/PROM:  A/PROM RIGHT   eval   Shoulder extension   Shoulder flexion   Shoulder abduction   Shoulder internal rotation   Shoulder external rotation     (Blank rows = not tested)  A/PROM LEFT   eval  Shoulder extension   Shoulder flexion   Shoulder abduction   Shoulder internal rotation   Shoulder external rotation     (Blank rows = not tested)  CERVICAL AROM: All within normal limits:    Percent limited  Flexion   Extension   Right lateral flexion   Left lateral flexion   Right rotation   Left rotation     UPPER EXTREMITY STRENGTH: ***   LOWER EXTREMITY MMT:  A/PROM Right eval  Hip flexion   Hip extension   Hip abduction   Hip adduction   Hip internal rotation   Hip external rotation   Knee flexion   Knee extension   Ankle dorsiflexion   Ankle plantarflexion   Ankle inversion   Ankle eversion    (Blank rows = not tested)  A/PROM LEFT eval  Hip flexion   Hip extension   Hip abduction   Hip adduction   Hip internal rotation   Hip external rotation   Knee flexion   Knee extension   Ankle dorsiflexion   Ankle plantarflexion   Ankle inversion   Ankle eversion    (Blank rows = not tested)    FUNCTIONAL TESTS:  {Functional tests:24029}  GAIT: Distance walked: *** Assistive device utilized: {Assistive devices:23999} Level of assistance: {Levels of assistance:24026} Comments: ***    QUICK DASH SURVEY: ***  TREATMENT DATE: ***   PATIENT EDUCATION:  Education details: *** Person educated: {Person educated:25204} Education method: {Education Method:25205} Education comprehension: {Education Comprehension:25206}  HOME EXERCISE PROGRAM: ***  ASSESSMENT:  CLINICAL IMPRESSION: Patient is a *** y.o. *** who was seen today for physical therapy evaluation and treatment for ***.   OBJECTIVE IMPAIRMENTS: {opptimpairments:25111}.   ACTIVITY LIMITATIONS: {activitylimitations:27494}  PARTICIPATION LIMITATIONS: {participationrestrictions:25113}  PERSONAL FACTORS: {Personal factors:25162} are also affecting patient's functional outcome.   REHAB POTENTIAL: {rehabpotential:25112}  CLINICAL DECISION MAKING: {clinical decision making:25114}  EVALUATION COMPLEXITY: {Evaluation complexity:25115}  GOALS: Goals reviewed with patient? {yes/no:20286}  SHORT TERM GOALS: Target date: ***  *** Baseline: Goal status: {GOALSTATUS:25110}  2.  *** Baseline:  Goal status: {GOALSTATUS:25110}  3.  *** Baseline:  Goal status: {GOALSTATUS:25110}  4.  *** Baseline:  Goal status: {GOALSTATUS:25110}  5.  *** Baseline:  Goal status: {GOALSTATUS:25110}  6.  *** Baseline:  Goal status: {GOALSTATUS:25110}  LONG TERM GOALS: Target date: ***  *** Baseline:  Goal status: {GOALSTATUS:25110}  2.  *** Baseline:  Goal status: {GOALSTATUS:25110}  3.  *** Baseline:  Goal status: {GOALSTATUS:25110}  4.  *** Baseline:  Goal status: {GOALSTATUS:25110}  5.  *** Baseline:  Goal status: {GOALSTATUS:25110}  6.  *** Baseline:  Goal status: {GOALSTATUS:25110}  PLAN:  PT FREQUENCY: {rehab frequency:25116}  PT DURATION: {rehab duration:25117}  PLANNED INTERVENTIONS: {rehab planned interventions:25118::97110-Therapeutic exercises,97530- Therapeutic [phone]- Neuromuscular  re-education,97535- Self Rjmz,02859- Manual therapy,Patient/Family education}  PLAN FOR NEXT SESSION: ***   Florina Lanis Carbon, PT 10/07/2024, 9:54 AM

## 2024-10-10 ENCOUNTER — Other Ambulatory Visit: Payer: Self-pay | Admitting: Hematology

## 2024-10-10 DIAGNOSIS — C9000 Multiple myeloma not having achieved remission: Secondary | ICD-10-CM

## 2024-10-11 ENCOUNTER — Ambulatory Visit: Admitting: Physical Therapy

## 2024-10-11 ENCOUNTER — Encounter: Payer: Self-pay | Admitting: Hematology

## 2024-10-12 MED FILL — Dexamethasone Sodium Phosphate Inj 100 MG/10ML: INTRAMUSCULAR | Qty: 2 | Status: AC

## 2024-10-13 ENCOUNTER — Inpatient Hospital Stay: Attending: Hematology

## 2024-10-13 ENCOUNTER — Inpatient Hospital Stay

## 2024-10-16 ENCOUNTER — Other Ambulatory Visit: Payer: Self-pay | Admitting: Hematology

## 2024-10-16 ENCOUNTER — Other Ambulatory Visit: Payer: Self-pay | Admitting: Family Medicine

## 2024-10-16 DIAGNOSIS — C9000 Multiple myeloma not having achieved remission: Secondary | ICD-10-CM

## 2024-10-17 ENCOUNTER — Other Ambulatory Visit (HOSPITAL_COMMUNITY): Payer: Self-pay

## 2024-10-18 ENCOUNTER — Inpatient Hospital Stay: Admitting: Nurse Practitioner

## 2024-10-18 ENCOUNTER — Other Ambulatory Visit (HOSPITAL_COMMUNITY): Payer: Self-pay

## 2024-10-18 ENCOUNTER — Encounter: Payer: Self-pay | Admitting: Hematology

## 2024-10-18 ENCOUNTER — Other Ambulatory Visit: Payer: Self-pay

## 2024-10-18 ENCOUNTER — Ambulatory Visit (INDEPENDENT_AMBULATORY_CARE_PROVIDER_SITE_OTHER): Payer: Medicaid Other | Admitting: Family Medicine

## 2024-10-18 MED ORDER — POTASSIUM CHLORIDE CRYS ER 20 MEQ PO TBCR
20.0000 meq | EXTENDED_RELEASE_TABLET | Freq: Two times a day (BID) | ORAL | 0 refills | Status: DC
  Filled 2024-10-18: qty 60, 30d supply, fill #0

## 2024-10-18 MED ORDER — ALBUTEROL SULFATE HFA 108 (90 BASE) MCG/ACT IN AERS
2.0000 | INHALATION_SPRAY | Freq: Four times a day (QID) | RESPIRATORY_TRACT | 2 refills | Status: AC | PRN
  Filled 2024-10-18: qty 18, 20d supply, fill #0

## 2024-10-19 ENCOUNTER — Encounter (HOSPITAL_COMMUNITY): Payer: Self-pay

## 2024-10-19 ENCOUNTER — Encounter (HOSPITAL_COMMUNITY): Admission: RE | Admit: 2024-10-19

## 2024-10-19 MED FILL — Dexamethasone Sodium Phosphate Inj 100 MG/10ML: INTRAMUSCULAR | Qty: 2 | Status: AC

## 2024-10-19 NOTE — Progress Notes (Signed)
 HEMATOLOGY/ONCOLOGY CLINIC NOTE  Date of Service: 10/20/2024  Patient Care Team: Anders Otto DASEN, MD as PCP - General (Family Medicine) Rosemarie [name] RAMAN, MD as Consulting Physician (Neurology) Wonda Cy BROCKS, RD as Dietitian (Family Medicine) Onesimo Emaline Brink, MD as Consulting Physician (Hematology)  CHIEF COMPLAINTS/PURPOSE OF CONSULTATION:  F/u for evaluation and mx of Multiple myeloma  INTERVAL HISTORY:  Carla Hanson is a 58 y.o. female here for evaluation and management of multiple myeloma. He was last seen by Dr. Onesimo on 09/27/2024. She is accompanied by her mother for this visit.   Ms. Benninger reports she is not feeling well today. She is extremely fatigued and lethargic. She adds that she has been in bed for most of the day for the past week. She needs assistance to ambulate and complete her basic ADLs. Her mother shares that she has concerns about transportation since she is unable to assist with mobilizing the patient. She reports persistent shortness of breath with minimal exertion. She is feeling nauseated today which has prevented her from eating. She denies fevers, chills, sweats, chest pain or cough.   MEDICAL HISTORY:  Past Medical History:  Diagnosis Date   Alcohol  abuse    Anxiety    Back pain    Bipolar disorder (HCC)    Bronchitis    COPD exacerbation (HCC) 05/10/2016   Drug use    Edema 02/24/2024   History of blood clots    Hyperlipidemia    Hypertension    Influenza A 12/01/2018   Joint pain    Left leg DVT (HCC) 09/29/2013   Lipoma    Abdomen   LIPOMA 01/20/2008   Obesity    Occasional tremors 05/12/2019   Osteoarthritis of right knee    Other fatigue    Painful menstrual periods 01/05/2012   Pneumonia    Prediabetes    Seizure (HCC)    Shortness of breath on exertion    Stroke (HCC)    Tobacco abuse     SURGICAL HISTORY: Past Surgical History:  Procedure Laterality Date   CESAREAN SECTION     CYSTECTOMY     LIPOMA EXCISION   03/2011   ORIF ANKLE FRACTURE  03/13/2012   Procedure: OPEN REDUCTION INTERNAL FIXATION (ORIF) ANKLE FRACTURE;  Surgeon: Lynwood FORBES Better, MD;  Location: [facility];  Service: Orthopedics;  Laterality: Right;   TUBAL LIGATION      SOCIAL HISTORY: Social History   Socioeconomic History   Marital status: Divorced    Spouse name: Not on file   Number of children: 4   Years of education: 10   Highest education level: Not on file  Occupational History   Occupation: stay at home  Tobacco Use   Smoking status: Former    Current packs/day: 0.00    Average packs/day: 0.5 packs/day for 35.0 years (17.5 ttl pk-yrs)    Types: Cigarettes    Start date: 11/04/1981    Quit date: 10/2016    Years since quitting: 8.0   Smokeless tobacco: Never  Vaping Use   Vaping status: Never Used  Substance and Sexual Activity   Alcohol  use: No    Alcohol /week: 0.0 standard drinks of alcohol     Comment: Quit 10/2016   Drug use: Yes    Types: Marijuana   Sexual activity: Not Currently    Comment: tubal  Other Topics Concern   Not on file  Social History Narrative   Unemployed, 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  Considering nursing.    Lives at home alone   Right-handed   Caffeine: not much   Social Drivers of Health   Tobacco Use: Medium Risk (10/20/2024)   Patient History    Smoking Tobacco Use: Former    Smokeless Tobacco Use: Never    Passive Exposure: Not on file  Financial Resource Strain: Low Risk (05/14/2024)   Overall Financial Resource Strain (CARDIA)    Difficulty of Paying Living Expenses: Not hard at all  Food Insecurity: No Food Insecurity (10/20/2024)   Epic    Worried About Programme Researcher, Broadcasting/film/video in the Last Year: Never true    Ran Out of Food in the Last Year: Never true  Transportation Needs: No Transportation Needs (10/20/2024)   Epic    Lack of Transportation (Medical): No    Lack of Transportation  (Non-Medical): No  Physical Activity: Insufficiently Active (05/14/2024)   Exercise Vital Sign    Days of Exercise per Week: 3 days    Minutes of Exercise per Session: 20 min  Stress: No Stress Concern Present (05/14/2024)   [employer] of Occupational Health - Occupational Stress Questionnaire    Feeling of Stress: Not at all  Social Connections: Moderately Integrated (10/20/2024)   Social Connection and Isolation Panel    Frequency of Communication with Friends and Family: More than three times a week    Frequency of Social Gatherings with Friends and Family: More than three times a week    Attends Religious Services: More than 4 times per year    Active Member of [employer] or Organizations: Yes    Attends Banker Meetings: More than 4 times per year    Marital Status: Divorced  Intimate Partner Violence: Not At Risk (10/20/2024)   Epic    Fear of Current or Ex-Partner: No    Emotionally Abused: No    Physically Abused: No    Sexually Abused: No  Depression (PHQ2-9): Low Risk (09/08/2024)   Depression (PHQ2-9)    PHQ-2 Score: 1  Alcohol  Screen: Not on file  Housing: Low Risk (10/20/2024)   Epic    Unable to Pay for Housing in the Last Year: No    Number of Times Moved in the Last Year: 0    Homeless in the Last Year: No  Utilities: Not At Risk (10/20/2024)   Epic    Threatened with loss of utilities: No  Health Literacy: Not on file    FAMILY HISTORY: Family History  Problem Relation Age of Onset   Other Mother        High blood pressure runs in the family   Obesity Mother    Asthma Father    Heart attack Father    Obesity Father    Diabetes Maternal Aunt    Breast cancer Neg Hx     ALLERGIES:  is allergic to latuda  [lurasidone ].  MEDICATIONS:  No current facility-administered medications for this visit.   No current outpatient medications on file.   Facility-Administered Medications Ordered in Other Visits  Medication Dose Route Frequency  Provider Last Rate Last Admin   0.9 %  sodium chloride  infusion (Manually program via Guardrails IV Fluids)   Intravenous Once Dasie Faden, MD       0.9 %  sodium chloride  infusion   Intravenous Continuous Zella, Mir M, MD       acetaminophen  (TYLENOL ) tablet 650 mg  650 mg Oral Q6H  PRN Zella, Mir M, MD       Or   acetaminophen  (TYLENOL ) suppository 650 mg  650 mg Rectal Q6H PRN Zella, Mir M, MD       albuterol  (PROVENTIL ) (2.5 MG/3ML) 0.083% nebulizer solution 2.5 mg  2.5 mg Nebulization Q2H PRN Zella, Mir M, MD       artificial tears ophthalmic solution 1 drop  1 drop Both Eyes TID PRN Zella, Mir M, MD       NOREEN ON 10/21/2024] aspirin  EC tablet 81 mg  81 mg Oral Daily Ikramullah, Mir M, MD       NOREEN ON 10/21/2024] ferrous sulfate  tablet 325 mg  325 mg Oral Q breakfast Zella, Mir M, MD       folic acid  (FOLVITE ) tablet 1 mg  1 mg Oral BID Zella, Mir M, MD       loratadine  (CLARITIN ) tablet 10 mg  10 mg Oral QHS Zella, Mir M, MD       losartan  (COZAAR ) tablet 25 mg  25 mg Oral Daily Zella, Mir M, MD       ondansetron  (ZOFRAN ) tablet 4 mg  4 mg Oral Q6H PRN Zella, Mir M, MD       Or   ondansetron  (ZOFRAN ) injection 4 mg  4 mg Intravenous Q6H PRN Zella, Mir M, MD       pantoprazole  (PROTONIX ) EC tablet 40 mg  40 mg Oral Q0600 Ikramullah, Mir M, MD       potassium chloride  SA (KLOR-CON  M) CR tablet 20 mEq  20 mEq Oral BID Zella, Mir M, MD       thiamine  (VITAMIN B1) tablet 100 mg  100 mg Oral Daily Zella, Mir M, MD       traZODone  (DESYREL ) tablet 25 mg  25 mg Oral QHS PRN Zella, Mir M, MD        REVIEW OF SYSTEMS:    10 Point review of Systems was done is negative except as noted above.   PHYSICAL EXAMINATION: ECOG PERFORMANCE STATUS: 2+  . Vitals:   10/20/24 0843  BP: 117/61  Pulse: (!) 111  Resp: 16  Temp: 97.9 F (36.6 C)  SpO2: 100%     Filed Weights    .There is no height or weight on file to  calculate BMI.   GENERAL: alert but has tremors. Chronic ill appearing.  SKIN: no acute rashes, no significant lesions EYES: conjunctiva are pink and non-injected, sclera anicteric LUNGS: clear to auscultation b/l with normal respiratory effort HEART: tachycardic.  Extremity: bilateral trace pedal edema PSYCH: alert & oriented x 3 with fluent speech NEURO: no focal motor/sensory deficits   LABORATORY DATA:  I have reviewed the data as listed  .    Latest Ref Rng & Units 10/20/2024    8:17 AM 09/27/2024    2:12 PM 09/22/2024   12:28 PM  CBC  WBC 4.0 - 10.5 K/uL 17.1  5.3  5.6   Hemoglobin 12.0 - 15.0 g/dL 5.9  7.3  8.5   Hematocrit 36.0 - 46.0 % 18.1  21.6  25.6   Platelets 150 - 400 K/uL 31  93  111     .    Latest Ref Rng & Units 10/20/2024    8:17 AM 09/27/2024    2:12 PM 09/22/2024   12:28 PM  CMP  Glucose 70 - 99 mg/dL 864  867  870   BUN 6 - 20 mg/dL 9  9  10  Creatinine 0.44 - 1.00 mg/dL 9.40  9.43  9.37   Sodium 135 - 145 mmol/L 140  139  138   Potassium 3.5 - 5.1 mmol/L 3.5  3.9  3.9   Chloride 98 - 111 mmol/L 103  105  99   CO2 22 - 32 mmol/L 22  23  25    Calcium  8.9 - 10.3 mg/dL 87.1  89.5  87.6   Total Protein 6.5 - 8.1 g/dL 8.2  7.8  7.7   Total Bilirubin 0.0 - 1.2 mg/dL 0.9  0.5  0.5   Alkaline Phos 38 - 126 U/L 172  154  182   AST 15 - 41 U/L 52  28  96   ALT 0 - 44 U/L 11  8  11     02/05/2024 Bone Marrow Biopsy:       RADIOGRAPHIC STUDIES: I have personally reviewed the radiological images as listed and agreed with the findings in the report. No results found.    ASSESSMENT & PLAN:  DELAILA NAND is a 58 y.o. female who presents to the clinic for continued management of multiple myeloma.   #Multiple Myeloma --Bone marrow biopsy on 02/05/2024 showed Hypercellular bone marrow (60%) involved by plasma cell neoplasm (9 % plasma cells by manual aspirate differential, approximately 10% by CD138 immunohistochemical analysis, and lambda  restricted by kappa/lambda in situ hybridization  --PET scan from 02/12/2024 showed extensive lytic lesions throughout the skeleton.  --Started Cycle 1, Day 1 of Daratumumab /Velcade /Dex on 03/12/2024 --Started Zometa  on 03/12/2024 q 28 days --M protein started with M protein 1.5 g/dL on 89/77/7974. Recommend to switch to [place]/Cytoxan /Dex, started on 09/15/2024 PLAN: ---Due for Cycle 2, Day 1 of [place]/Cytoxan  today --Labs from today were reviewed with patient. WBC 17.1, Hgb 5.9, Plt 31K. --Due to acute cytopenias and poor PS, we will direct patient to ER for further evaluation. --Dr. Onesimo will follow patient during hospitalization.   #History of T6 compression fracture: --Likely related to multiple myeloma.  Mild pain at this time.  #Pancytopenia/folate deficiency: --Recently hospitalized from 09/08/2024-09/12/2024 required 3 units of PRBC, FOBT negative.  --Recommend to start folic acid  1 mg BID and B complex.  #History of VTE: --SAH with AC, cannot tolerate AC in order to consider Revlimid therapy  #Hypercalcemia: *** --Currently on vitamin B1/Thiaminne 100 mg PO daily --Receives Zometa  q 28 days, last received 09/22/2024 --Calcium  level today is *** --Sent prescription for calcitonin nasal spray with directions to spray in each nostril once a day.    All of the patient's questions were answered with apparent satisfaction. The patient knows to call the clinic with any problems, questions or concerns.   I have spent a total of 30 minutes minutes of face-to-face and non-face-to-face time, preparing to see the patient, performing a medically appropriate examination, counseling and educating the patient, documenting clinical information in the electronic health record, independently interpreting results and communicating results to the patient, and care coordination.   Johnston Police PA-C Dept of Hematology and Oncology [facility] Phone:  [phone]

## 2024-10-20 ENCOUNTER — Inpatient Hospital Stay (HOSPITAL_COMMUNITY)
Admission: EM | Admit: 2024-10-20 | Discharge: 2024-11-05 | DRG: 803 | Disposition: A | Discharge status: Skilled Nursing Facility | Source: Ambulatory Visit | Attending: Family Medicine | Admitting: Family Medicine

## 2024-10-20 ENCOUNTER — Inpatient Hospital Stay

## 2024-10-20 ENCOUNTER — Other Ambulatory Visit: Payer: Self-pay

## 2024-10-20 ENCOUNTER — Inpatient Hospital Stay: Admitting: Dietician

## 2024-10-20 ENCOUNTER — Inpatient Hospital Stay: Attending: Hematology

## 2024-10-20 ENCOUNTER — Inpatient Hospital Stay: Admitting: Physician Assistant

## 2024-10-20 ENCOUNTER — Encounter (HOSPITAL_COMMUNITY): Payer: Self-pay

## 2024-10-20 VITALS — BP 117/61 | HR 111 | Temp 97.9°F | Resp 16

## 2024-10-20 DIAGNOSIS — D6481 Anemia due to antineoplastic chemotherapy: Principal | ICD-10-CM | POA: Diagnosis present

## 2024-10-20 DIAGNOSIS — I4892 Unspecified atrial flutter: Secondary | ICD-10-CM | POA: Diagnosis not present

## 2024-10-20 DIAGNOSIS — Z86718 Personal history of other venous thrombosis and embolism: Secondary | ICD-10-CM | POA: Diagnosis not present

## 2024-10-20 DIAGNOSIS — J3089 Other allergic rhinitis: Secondary | ICD-10-CM

## 2024-10-20 DIAGNOSIS — G893 Neoplasm related pain (acute) (chronic): Secondary | ICD-10-CM | POA: Diagnosis present

## 2024-10-20 DIAGNOSIS — C9000 Multiple myeloma not having achieved remission: Principal | ICD-10-CM | POA: Diagnosis present

## 2024-10-20 DIAGNOSIS — D61818 Other pancytopenia: Secondary | ICD-10-CM | POA: Diagnosis present

## 2024-10-20 DIAGNOSIS — Z87891 Personal history of nicotine dependence: Secondary | ICD-10-CM | POA: Diagnosis not present

## 2024-10-20 DIAGNOSIS — D649 Anemia, unspecified: Secondary | ICD-10-CM | POA: Diagnosis not present

## 2024-10-20 DIAGNOSIS — Z888 Allergy status to other drugs, medicaments and biological substances status: Secondary | ICD-10-CM

## 2024-10-20 DIAGNOSIS — K219 Gastro-esophageal reflux disease without esophagitis: Secondary | ICD-10-CM | POA: Diagnosis present

## 2024-10-20 DIAGNOSIS — E876 Hypokalemia: Secondary | ICD-10-CM | POA: Diagnosis present

## 2024-10-20 DIAGNOSIS — Z86711 Personal history of pulmonary embolism: Secondary | ICD-10-CM

## 2024-10-20 DIAGNOSIS — I119 Hypertensive heart disease without heart failure: Secondary | ICD-10-CM | POA: Diagnosis present

## 2024-10-20 DIAGNOSIS — Z8673 Personal history of transient ischemic attack (TIA), and cerebral infarction without residual deficits: Secondary | ICD-10-CM

## 2024-10-20 DIAGNOSIS — Z7189 Other specified counseling: Secondary | ICD-10-CM

## 2024-10-20 DIAGNOSIS — J90 Pleural effusion, not elsewhere classified: Secondary | ICD-10-CM | POA: Diagnosis present

## 2024-10-20 DIAGNOSIS — Z6837 Body mass index (BMI) 37.0-37.9, adult: Secondary | ICD-10-CM

## 2024-10-20 DIAGNOSIS — Z5111 Encounter for antineoplastic chemotherapy: Secondary | ICD-10-CM | POA: Diagnosis not present

## 2024-10-20 DIAGNOSIS — T451X5A Adverse effect of antineoplastic and immunosuppressive drugs, initial encounter: Secondary | ICD-10-CM | POA: Diagnosis present

## 2024-10-20 DIAGNOSIS — E785 Hyperlipidemia, unspecified: Secondary | ICD-10-CM | POA: Diagnosis present

## 2024-10-20 DIAGNOSIS — D6959 Other secondary thrombocytopenia: Secondary | ICD-10-CM | POA: Diagnosis present

## 2024-10-20 DIAGNOSIS — Z8249 Family history of ischemic heart disease and other diseases of the circulatory system: Secondary | ICD-10-CM

## 2024-10-20 DIAGNOSIS — R748 Abnormal levels of other serum enzymes: Secondary | ICD-10-CM | POA: Diagnosis not present

## 2024-10-20 DIAGNOSIS — Z7982 Long term (current) use of aspirin: Secondary | ICD-10-CM

## 2024-10-20 DIAGNOSIS — Z833 Family history of diabetes mellitus: Secondary | ICD-10-CM

## 2024-10-20 DIAGNOSIS — E66812 Obesity, class 2: Secondary | ICD-10-CM | POA: Diagnosis present

## 2024-10-20 DIAGNOSIS — Z751 Person awaiting admission to adequate facility elsewhere: Secondary | ICD-10-CM

## 2024-10-20 DIAGNOSIS — Z7401 Bed confinement status: Secondary | ICD-10-CM

## 2024-10-20 DIAGNOSIS — Z515 Encounter for palliative care: Principal | ICD-10-CM

## 2024-10-20 DIAGNOSIS — Z825 Family history of asthma and other chronic lower respiratory diseases: Secondary | ICD-10-CM

## 2024-10-20 DIAGNOSIS — Z79899 Other long term (current) drug therapy: Secondary | ICD-10-CM

## 2024-10-20 DIAGNOSIS — I10 Essential (primary) hypertension: Secondary | ICD-10-CM | POA: Diagnosis present

## 2024-10-20 DIAGNOSIS — E538 Deficiency of other specified B group vitamins: Secondary | ICD-10-CM | POA: Diagnosis present

## 2024-10-20 DIAGNOSIS — R4589 Other symptoms and signs involving emotional state: Secondary | ICD-10-CM

## 2024-10-20 DIAGNOSIS — D696 Thrombocytopenia, unspecified: Secondary | ICD-10-CM

## 2024-10-20 DIAGNOSIS — R52 Pain, unspecified: Secondary | ICD-10-CM

## 2024-10-20 DIAGNOSIS — D509 Iron deficiency anemia, unspecified: Secondary | ICD-10-CM | POA: Diagnosis present

## 2024-10-20 LAB — CMP (CANCER CENTER ONLY)
ALT: 11 U/L (ref 0–44)
AST: 52 U/L — ABNORMAL HIGH (ref 15–41)
Albumin: 3.3 g/dL — ABNORMAL LOW (ref 3.5–5.0)
Alkaline Phosphatase: 172 U/L — ABNORMAL HIGH (ref 38–126)
Anion gap: 14 (ref 5–15)
BUN: 9 mg/dL (ref 6–20)
CO2: 22 mmol/L (ref 22–32)
Calcium: 12.8 mg/dL — ABNORMAL HIGH (ref 8.9–10.3)
Chloride: 103 mmol/L (ref 98–111)
Creatinine: 0.59 mg/dL (ref 0.44–1.00)
GFR, Estimated: >60 mL/min (ref >60)
Glucose, Bld: 135 mg/dL — ABNORMAL HIGH (ref 70–99)
Potassium: 3.5 mmol/L (ref 3.5–5.1)
Sodium: 140 mmol/L (ref 135–145)
Total Bilirubin: 0.9 mg/dL (ref 0.0–1.2)
Total Protein: 8.2 g/dL — ABNORMAL HIGH (ref 6.5–8.1)

## 2024-10-20 LAB — CBC WITH DIFFERENTIAL (CANCER CENTER ONLY)
Abs Immature Granulocytes: 1.82 K/uL — ABNORMAL HIGH (ref 0.00–0.07)
Basophils Absolute: 0.1 K/uL (ref 0.0–0.1)
Basophils Relative: 1 %
Eosinophils Absolute: 0.2 K/uL (ref 0.0–0.5)
Eosinophils Relative: 1 %
HCT: 18.1 % — ABNORMAL LOW (ref 36.0–46.0)
Hemoglobin: 5.9 g/dL — CL (ref 12.0–15.0)
Immature Granulocytes: 11 %
Lymphocytes Relative: 37 %
Lymphs Abs: 6.3 K/uL — ABNORMAL HIGH (ref 0.7–4.0)
MCH: 27.1 pg (ref 26.0–34.0)
MCHC: 32.6 g/dL (ref 30.0–36.0)
MCV: 83 fL (ref 80.0–100.0)
Monocytes Absolute: 1.7 K/uL — ABNORMAL HIGH (ref 0.1–1.0)
Monocytes Relative: 10 %
Neutro Abs: 7 K/uL (ref 1.7–7.7)
Neutrophils Relative %: 40 %
Platelet Count: 31 K/uL — ABNORMAL LOW (ref 150–400)
RBC: 2.18 MIL/uL — ABNORMAL LOW (ref 3.87–5.11)
RDW: 16.1 % — ABNORMAL HIGH (ref 11.5–15.5)
Smear Review: NORMAL
WBC Count: 17.1 K/uL — ABNORMAL HIGH (ref 4.0–10.5)
nRBC: 10.3 % — ABNORMAL HIGH (ref 0.0–0.2)

## 2024-10-20 LAB — SAMPLE TO BLOOD BANK

## 2024-10-20 LAB — PREPARE RBC (CROSSMATCH)

## 2024-10-20 LAB — POC OCCULT BLOOD, ED: Fecal Occult Bld: NEGATIVE

## 2024-10-20 MED ORDER — POLYVINYL ALCOHOL 1.4 % OP SOLN: 1.0000 [drp] | Freq: Three times a day (TID) | OPHTHALMIC | Status: DC | PRN

## 2024-10-20 MED ORDER — THIAMINE MONONITRATE 100 MG PO TABS
100.0000 mg | ORAL_TABLET | Freq: Every day | ORAL | Status: DC
  Administered 2024-10-20 – 2024-11-05 (×14): 100 mg via ORAL
  Filled 2024-10-20 (×12): qty 1

## 2024-10-20 MED ORDER — POLYETHYL GLYCOL-PROPYL GLYCOL 0.4-0.3 % OP SOLN: 1.0000 [drp] | Freq: Three times a day (TID) | OPHTHALMIC | Status: DC | PRN

## 2024-10-20 MED ORDER — ACETAMINOPHEN 650 MG RE SUPP: 650.0000 mg | Freq: Four times a day (QID) | RECTAL | Status: DC | PRN

## 2024-10-20 MED ORDER — POTASSIUM CHLORIDE CRYS ER 20 MEQ PO TBCR
20.0000 meq | EXTENDED_RELEASE_TABLET | Freq: Two times a day (BID) | ORAL | Status: DC
  Administered 2024-10-20: 19:00:00 20 meq via ORAL
  Filled 2024-10-20: qty 1

## 2024-10-20 MED ORDER — SODIUM CHLORIDE 0.9% IV SOLUTION: Freq: Once | INTRAVENOUS | Status: DC

## 2024-10-20 MED ORDER — ASPIRIN 81 MG PO TBEC: 81.0000 mg | DELAYED_RELEASE_TABLET | Freq: Every day | ORAL | Status: DC

## 2024-10-20 MED ORDER — CALCITONIN (SALMON) 200 UNIT/ACT NA SOLN: 1.0000 | Freq: Every day | NASAL | Status: DC

## 2024-10-20 MED ORDER — SODIUM CHLORIDE 0.9 % IV SOLN: INTRAVENOUS | Status: AC

## 2024-10-20 MED ORDER — ACETAMINOPHEN 325 MG PO TABS
650.0000 mg | ORAL_TABLET | Freq: Four times a day (QID) | ORAL | Status: DC | PRN
  Administered 2024-10-20 – 2024-11-01 (×7): 650 mg via ORAL
  Filled 2024-10-20 (×5): qty 2

## 2024-10-20 MED ORDER — PANTOPRAZOLE SODIUM 40 MG PO TBEC
40.0000 mg | DELAYED_RELEASE_TABLET | Freq: Every day | ORAL | Status: DC
  Administered 2024-10-20 – 2024-11-05 (×16): 40 mg via ORAL
  Filled 2024-10-20 (×12): qty 1

## 2024-10-20 MED ORDER — LORATADINE 10 MG PO TABS
10.0000 mg | ORAL_TABLET | Freq: Every day | ORAL | Status: DC
  Administered 2024-10-20 – 2024-11-04 (×16): 10 mg via ORAL
  Filled 2024-10-20 (×12): qty 1

## 2024-10-20 MED ORDER — LOSARTAN POTASSIUM 25 MG PO TABS
25.0000 mg | ORAL_TABLET | Freq: Every day | ORAL | Status: DC
  Administered 2024-10-20 – 2024-10-28 (×9): 25 mg via ORAL
  Filled 2024-10-20 (×9): qty 1

## 2024-10-20 MED ORDER — ONDANSETRON HCL 4 MG/2ML IJ SOLN: 4.0000 mg | Freq: Four times a day (QID) | INTRAMUSCULAR | Status: DC | PRN

## 2024-10-20 MED ORDER — TRAZODONE HCL 50 MG PO TABS
25.0000 mg | ORAL_TABLET | Freq: Every evening | ORAL | Status: DC | PRN
  Administered 2024-10-26 – 2024-10-31 (×4): 25 mg via ORAL
  Filled 2024-10-20 (×5): qty 1

## 2024-10-20 MED ORDER — FERROUS SULFATE 325 (65 FE) MG PO TABS
325.0000 mg | ORAL_TABLET | Freq: Every day | ORAL | Status: DC
  Administered 2024-10-21 – 2024-11-05 (×15): 325 mg via ORAL
  Filled 2024-10-20 (×11): qty 1

## 2024-10-20 MED ORDER — ALBUTEROL SULFATE (2.5 MG/3ML) 0.083% IN NEBU
2.5000 mg | INHALATION_SOLUTION | RESPIRATORY_TRACT | Status: DC | PRN
  Administered 2024-10-20 – 2024-10-23 (×4): 2.5 mg via RESPIRATORY_TRACT
  Filled 2024-10-20 (×4): qty 3

## 2024-10-20 MED ORDER — SODIUM CHLORIDE 0.9% FLUSH: 10.0000 mL | INTRAVENOUS | Status: DC | PRN

## 2024-10-20 MED ORDER — ONDANSETRON HCL 4 MG PO TABS: 4.0000 mg | ORAL_TABLET | Freq: Four times a day (QID) | ORAL | Status: DC | PRN

## 2024-10-20 MED ORDER — FOLIC ACID 1 MG PO TABS
1.0000 mg | ORAL_TABLET | Freq: Two times a day (BID) | ORAL | Status: DC
  Administered 2024-10-20 – 2024-11-05 (×29): 1 mg via ORAL
  Filled 2024-10-20 (×21): qty 1

## 2024-10-20 NOTE — Progress Notes (Signed)
 Planning to see patient during infusion for follow-up. Treatment cancelled due to labs. Patient being taken to ED for further evaluation. Nutrition appointment rescheduled 12/23 during infusion.

## 2024-10-20 NOTE — ED Triage Notes (Signed)
 Pt being seen at the cancer center, labs drawn there resulted in hgb critical 5.3. Pt sent over to ED, denies pain at this time, has been feeling weakness for the past few days

## 2024-10-20 NOTE — Progress Notes (Signed)

## 2024-10-20 NOTE — ED Provider Notes (Signed)
 [place] EMERGENCY DEPARTMENT AT [facility] Provider Note   CSN: [id number] Arrival date & time: 10/20/24  9042     Patient presents with: low hemoglobin    Carla Hanson is a 58 y.o. female.   58 year old female with history of multiple myeloma presents from cancer center due to low hemoglobin of 5.3.  Patient denies any history of GI bleed at this time.  Has not had any emesis.  No dark stools.  Patient states that she has been transfused before in the past.  Also notes decreased ambulation for about a week according to the caregiver.  States he does not have any energy or strength.  Was sent to the ED for further management       Prior to Admission medications  Medication Sig Start Date End Date Taking? Authorizing Provider  acetaminophen  (TYLENOL ) 325 MG tablet Take 2 tablets (650 mg total) by mouth every 6 (six) hours  if needed for mild pain (1-3) for up to 5 days. Patient taking differently: Take 325-650 mg by mouth every 6 (six) hours as needed for mild pain (pain score 1-3) (or headaches). 08/18/24     acyclovir  (ZOVIRAX ) 400 MG tablet Take 1 tablet (400 mg total) by mouth 2 (two) times daily. 08/06/24   Onesimo Emaline Brink, MD  albuterol  (PROVENTIL ) (2.5 MG/3ML) 0.083% nebulizer solution Take 3 mLs (2.5 mg total) by nebulization every 6 (six) hours as needed for wheezing or shortness of breath. 02/06/24   Anders Otto DASEN, MD  albuterol  (VENTOLIN  HFA) 108 (90 Base) MCG/ACT inhaler Inhale 2 puffs into the lungs every 6 (six) hours as needed for wheezing or shortness of breath. 10/18/24   Anders Otto DASEN, MD  aspirin  EC 81 MG EC tablet Take 1 tablet (81 mg total) by mouth daily. 11/14/16   Noemi Reena LITTIE, NP  calcitonin, salmon, (MIACALCIN MARCIANA) 200 UNIT/ACT nasal spray Place 1 spray into alternate nostrils daily. 09/15/24   Neomi Johnston DASEN, PA-C  carvedilol  (COREG ) 3.125 MG tablet Take 1 tablet (3.125 mg total) by mouth in the morning and evening with  meals. Patient not taking: Reported on 09/21/2024 08/18/24     cetirizine  (ZYRTEC ) 10 MG tablet Take 1 tablet (10 mg total) by mouth daily. Patient taking differently: Take 10 mg by mouth at bedtime. 08/06/24   Anders Otto DASEN, MD  ferrous sulfate  325 (65 FE) MG tablet Take 325 mg by mouth daily with breakfast.    [provider]  folic acid  (FOLVITE ) 1 MG tablet Take 1 tablet (1 mg total) by mouth 2 (two) times daily. 09/15/24   Onesimo Emaline Brink, MD  LORazepam  (ATIVAN ) 0.5 MG tablet Take 1 tablet (0.5 mg total) by mouth once as needed for up to 1 dose for anxiety (for PET/CT or MRI). 10/04/24   Onesimo Emaline Brink, MD  losartan  (COZAAR ) 25 MG tablet Take 1 tablet (25 mg total) by mouth daily. 09/21/24   Verdon Parry D, MD  pantoprazole  (PROTONIX ) 40 MG tablet Take 1 tablet (40 mg total) by mouth daily at 6 (six) AM. 09/13/24   Sebastian Toribio GAILS, MD  potassium chloride  SA (KLOR-CON  M) 20 MEQ tablet Take 1 tablet (20 mEq total) by mouth 2 (two) times daily. 10/18/24 11/17/24  Onesimo Emaline Brink, MD  prochlorperazine  (COMPAZINE ) 10 MG tablet Take 1 tablet (10 mg total) by mouth every 6 (six) hours as needed for nausea or vomiting 07/06/24   Onesimo Emaline Brink, MD  SYSTANE ULTRA 0.4-0.3 % SOLN  Place 1 drop into both eyes 3 (three) times daily as needed (for dryness).    [provider]  thiamine  (VITAMIN B1) 100 MG tablet Take 1 tablet (100 mg total) by mouth daily. 09/15/24   Neomi Johnston DASEN, PA-C  tiZANidine  (ZANAFLEX ) 4 MG tablet Take 1 tablet (4 mg total) by mouth 2 (two) times daily as needed for muscle spasms. 08/06/24   Anders Otto DASEN, MD  rosuvastatin  (CRESTOR ) 20 MG tablet Take 1 tablet (20 mg total) by mouth daily. 05/20/24 10/06/24  Anders Otto DASEN, MD    Allergies: Latuda  [lurasidone ]    Review of Systems  All other systems reviewed and are negative.   Updated Vital Signs BP 120/72   Pulse (!) 104   Temp 98.1 F (36.7 C) (Oral)   Resp (!) 23   LMP   (LMP Unknown)   SpO2 100%   Physical Exam Vitals and nursing note reviewed.  Constitutional:      General: She is not in acute distress.    Appearance: Normal appearance. She is well-developed. She is not toxic-appearing.  HENT:     Head: Normocephalic and atraumatic.  Eyes:     General: Lids are normal.     Conjunctiva/sclera: Conjunctivae normal.     Pupils: Pupils are equal, round, and reactive to light.     Comments: Pale conjunctiva  Neck:     Thyroid : No thyroid  mass.     Trachea: No tracheal deviation.  Cardiovascular:     Rate and Rhythm: Normal rate and regular rhythm.     Heart sounds: Normal heart sounds. No murmur heard.    No gallop.  Pulmonary:     Effort: Pulmonary effort is normal. No respiratory distress.     Breath sounds: Normal breath sounds. No stridor. No decreased breath sounds, wheezing, rhonchi or rales.  Abdominal:     General: There is no distension.     Palpations: Abdomen is soft.     Tenderness: There is no abdominal tenderness. There is no rebound.  Musculoskeletal:        General: No tenderness. Normal range of motion.     Cervical back: Normal range of motion and neck supple.  Skin:    General: Skin is warm and dry.     Findings: No abrasion or rash.  Neurological:     General: No focal deficit present.     Mental Status: She is alert and oriented to person, place, and time. Mental status is at baseline.     GCS: GCS eye subscore is 4. GCS verbal subscore is 5. GCS motor subscore is 6.     Cranial Nerves: No cranial nerve deficit.     Sensory: No sensory deficit.     Motor: Motor function is intact.  Psychiatric:        Attention and Perception: Attention normal.        Speech: Speech normal.        Behavior: Behavior normal.     (all labs ordered are listed, but only abnormal results are displayed) Labs Reviewed  PREPARE RBC (CROSSMATCH)    EKG: None  Radiology: No results found.   Procedures   Medications Ordered in the  ED  0.9 %  sodium chloride  infusion (Manually program via Guardrails IV Fluids) (has no administration in time range)  Medical Decision Making Amount and/or Complexity of Data Reviewed Labs: ordered.  Risk Prescription drug management.   Patient's lab results from the cancer center noted.  2 units of packed red blood cells ordered.  Due to patient's increased weakness he will require admission.  Will consult hospitalist team     Final diagnoses:  None    ED Discharge Orders     None          Dasie Faden, MD 10/20/24 1041

## 2024-10-20 NOTE — H&P (Signed)
 History and Physical  Carla Hanson FMW:[id number] DOB: [birth year] DOA: 10/20/2024  PCP: Anders Otto DASEN, MD   Chief Complaint: Weakness  HPI: Carla Hanson is a 58 y.o. female with medical history significant for DVT on aspirin  only due to intracranial hemorrhage on Xarelto , prediabetes, hypertension, hyperlipidemia, COPD on room air, multiple myeloma on chemotherapy under the care of Dr. Onesimo being admitted to the hospital with recurrent symptomatic anemia.  Patient states that she has gradual weakness over the last few days, denies any dizziness, falls, chest pain.  Denies any hematochezia, melena, abdominal pain, or vomiting.  She has had some nausea in the last couple of days which is atypical for her.  She takes a baby aspirin  daily.  Routine lab work at the oncology office today revealed a hemoglobin of 5.3, for which reason she was sent to the ER for evaluation.  ER provider has ordered 2 units PRBC, and requested hospitalist admission.  Review of Systems: Please see HPI for pertinent positives and negatives. A complete 10 system review of systems are otherwise negative.  Past Medical History:  Diagnosis Date   Alcohol  abuse    Anxiety    Back pain    Bipolar disorder (HCC)    Bronchitis    COPD exacerbation (HCC) 05/10/2016   Drug use    Edema 02/24/2024   History of blood clots    Hyperlipidemia    Hypertension    Influenza A 12/01/2018   Joint pain    Left leg DVT (HCC) 09/29/2013   Lipoma    Abdomen   LIPOMA 01/20/2008   Obesity    Occasional tremors 05/12/2019   Osteoarthritis of right knee    Other fatigue    Painful menstrual periods 01/05/2012   Pneumonia    Prediabetes    Seizure (HCC)    Shortness of breath on exertion    Stroke (HCC)    Tobacco abuse    Past Surgical History:  Procedure Laterality Date   CESAREAN SECTION     CYSTECTOMY     LIPOMA EXCISION  03/2011   ORIF ANKLE FRACTURE  03/13/2012   Procedure: OPEN REDUCTION INTERNAL FIXATION  (ORIF) ANKLE FRACTURE;  Surgeon: Lynwood FORBES Better, MD;  Location: [facility];  Service: Orthopedics;  Laterality: Right;   TUBAL LIGATION     Social History:  reports that she quit smoking about 8 years ago. Her smoking use included cigarettes. She started smoking about 42 years ago. She has a 17.5 pack-year smoking history. She has never used smokeless tobacco. She reports current drug use. Drug: Marijuana. She reports that she does not drink alcohol .  Allergies[1]  Family History  Problem Relation Age of Onset   Other Mother        High blood pressure runs in the family   Obesity Mother    Asthma Father    Heart attack Father    Obesity Father    Diabetes Maternal Aunt    Breast cancer Neg Hx      Prior to Admission medications  Medication Sig Start Date End Date Taking? Authorizing Provider  acetaminophen  (TYLENOL ) 325 MG tablet Take 2 tablets (650 mg total) by mouth every 6 (six) hours  if needed for mild pain (1-3) for up to 5 days. Patient taking differently: Take 325-650 mg by mouth every 6 (six) hours as needed for mild pain (pain score 1-3) (or headaches). 08/18/24     acyclovir  (ZOVIRAX ) 400 MG tablet Take 1 tablet (400 mg  total) by mouth 2 (two) times daily. 08/06/24   Onesimo Emaline Brink, MD  albuterol  (PROVENTIL ) (2.5 MG/3ML) 0.083% nebulizer solution Take 3 mLs (2.5 mg total) by nebulization every 6 (six) hours as needed for wheezing or shortness of breath. 02/06/24   Anders Otto DASEN, MD  albuterol  (VENTOLIN  HFA) 108 (90 Base) MCG/ACT inhaler Inhale 2 puffs into the lungs every 6 (six) hours as needed for wheezing or shortness of breath. 10/18/24   Anders Otto DASEN, MD  aspirin  EC 81 MG EC tablet Take 1 tablet (81 mg total) by mouth daily. 11/14/16   Noemi Reena CROME, NP  calcitonin, salmon, (MIACALCIN MARCIANA) 200 UNIT/ACT nasal spray Place 1 spray into alternate nostrils daily. 09/15/24   Thayil, Irene T, PA-C  carvedilol  (COREG ) 3.125 MG tablet Take 1 tablet (3.125 mg total) by  mouth in the morning and evening with meals. Patient not taking: Reported on 09/21/2024 08/18/24     cetirizine  (ZYRTEC ) 10 MG tablet Take 1 tablet (10 mg total) by mouth daily. Patient taking differently: Take 10 mg by mouth at bedtime. 08/06/24   Anders Otto DASEN, MD  ferrous sulfate  325 (65 FE) MG tablet Take 325 mg by mouth daily with breakfast.    [provider]  folic acid  (FOLVITE ) 1 MG tablet Take 1 tablet (1 mg total) by mouth 2 (two) times daily. 09/15/24   Onesimo Emaline Brink, MD  LORazepam  (ATIVAN ) 0.5 MG tablet Take 1 tablet (0.5 mg total) by mouth once as needed for up to 1 dose for anxiety (for PET/CT or MRI). 10/04/24   Onesimo Emaline Brink, MD  losartan  (COZAAR ) 25 MG tablet Take 1 tablet (25 mg total) by mouth daily. 09/21/24   Verdon Parry D, MD  pantoprazole  (PROTONIX ) 40 MG tablet Take 1 tablet (40 mg total) by mouth daily at 6 (six) AM. 09/13/24   Sebastian Toribio GAILS, MD  potassium chloride  SA (KLOR-CON  M) 20 MEQ tablet Take 1 tablet (20 mEq total) by mouth 2 (two) times daily. 10/18/24 11/17/24  Onesimo Emaline Brink, MD  prochlorperazine  (COMPAZINE ) 10 MG tablet Take 1 tablet (10 mg total) by mouth every 6 (six) hours as needed for nausea or vomiting 07/06/24   Onesimo Emaline Brink, MD  SYSTANE ULTRA 0.4-0.3 % SOLN Place 1 drop into both eyes 3 (three) times daily as needed (for dryness).    [provider]  thiamine  (VITAMIN B1) 100 MG tablet Take 1 tablet (100 mg total) by mouth daily. 09/15/24   Neomi Johnston DASEN, PA-C  tiZANidine  (ZANAFLEX ) 4 MG tablet Take 1 tablet (4 mg total) by mouth 2 (two) times daily as needed for muscle spasms. 08/06/24   Anders Otto DASEN, MD  rosuvastatin  (CRESTOR ) 20 MG tablet Take 1 tablet (20 mg total) by mouth daily. 05/20/24 10/06/24  Anders Otto DASEN, MD    Physical Exam: BP 120/72   Pulse (!) 104   Temp 98.1 F (36.7 C) (Oral)   Resp (!) 23   LMP  (LMP Unknown)   SpO2 100%  General:  Alert, oriented, calm, in no acute  distress  Eyes: EOMI, clear conjuctivae, white sclerea Neck: supple, no masses, trachea mildline  Cardiovascular: RRR, no murmurs or rubs, no peripheral edema  Respiratory: clear to auscultation bilaterally, no wheezes, no crackles  Abdomen: soft, nontender, nondistended, normal bowel tones heard  Skin: dry, no rashes  Musculoskeletal: no joint effusions, normal range of motion  Psychiatric: appropriate affect, normal speech  Neurologic: extraocular muscles intact, clear speech, moving all  extremities with intact sensorium         Labs on Admission:  Basic Metabolic Panel: Recent Labs  Lab 10/20/24 0817  NA 140  K 3.5  CL 103  CO2 22  GLUCOSE 135*  BUN 9  CREATININE 0.59  CALCIUM  12.8*   Liver Function Tests: Recent Labs  Lab 10/20/24 0817  AST 52*  ALT 11  ALKPHOS 172*  BILITOT 0.9  PROT 8.2*  ALBUMIN 3.3*   No results for input(s): LIPASE, AMYLASE in the last 168 hours. No results for input(s): AMMONIA in the last 168 hours. CBC: Recent Labs  Lab 10/20/24 0817  WBC 17.1*  NEUTROABS 7.0  HGB 5.9*  HCT 18.1*  MCV 83.0  PLT 31*   Cardiac Enzymes: No results for input(s): CKTOTAL, CKMB, CKMBINDEX, TROPONINI in the last 168 hours. BNP (last 3 results) No results for input(s): BNP in the last 8760 hours.  ProBNP (last 3 results) No results for input(s): PROBNP in the last 8760 hours.  CBG: No results for input(s): GLUCAP in the last 168 hours.  Radiological Exams on Admission: No results found. Assessment/Plan Carla Hanson is a 58 y.o. female with medical history significant for DVT on aspirin  only due to intracranial hemorrhage on Xarelto , prediabetes, hypertension, hyperlipidemia, COPD on room air, multiple myeloma on chemotherapy under the care of Dr. Onesimo being admitted to the hospital with recurrent symptomatic anemia.  Symptomatic anemia-low hemoglobin of 5.3 at the cancer center today, repeat in the ER shows hemoglobin 5.9.   Likely due to chemotherapy.  Patient has been weak for the last few days, with some mild nausea.  No chest pain, dizziness or syncope.  No evidence of bleeding. -Observation admission -Transfuse 2 unit PRBC -Check posttransfusion hemoglobin, as well as in the morning  Multiple myeloma-undergoing chemotherapy under the care of Dr. Onesimo, who has been added to the inpatient treatment team  Leukocytosis-unclear etiology, patient currently without infectious symptoms.  Will monitor.  Hypercalcemia of malignancy-was previously on calcitonin and Zometa , per patient these have been discontinued.  She has some mild worsening of her hypercalcemia. -Hydrate with normal saline -Monitor calcium  level with daily labs  Chronic thrombocytopenia-due to her multiple myeloma and chemotherapy  Hyperlipidemia-no longer on statin at home  GERD-Protonix   Hypertension-continue home Cozaar   DVT prophylaxis: SCDs only    Code Status: Full Code  Consults called: Dr. Onesimo added to inpatient treatment team  Admission status: Observation  Time spent: 49 minutes  Gearline Spilman CHRISTELLA Gail MD [facility] Pager [phone]  If 7PM-7AM, please contact night-coverage [email or web address] Password [email or web address]  10/20/2024, 11:03 AM      [1]  Allergies Allergen Reactions   Latuda  [Lurasidone ] Other (See Comments)    Made leg muscles twitch (went off this herself in December, 2017)

## 2024-10-21 ENCOUNTER — Encounter: Payer: Self-pay | Admitting: Hematology

## 2024-10-21 DIAGNOSIS — R0602 Shortness of breath: Secondary | ICD-10-CM | POA: Diagnosis not present

## 2024-10-21 DIAGNOSIS — D6481 Anemia due to antineoplastic chemotherapy: Secondary | ICD-10-CM | POA: Diagnosis not present

## 2024-10-21 DIAGNOSIS — C9000 Multiple myeloma not having achieved remission: Secondary | ICD-10-CM | POA: Diagnosis not present

## 2024-10-21 DIAGNOSIS — Z7189 Other specified counseling: Secondary | ICD-10-CM

## 2024-10-21 DIAGNOSIS — D696 Thrombocytopenia, unspecified: Secondary | ICD-10-CM | POA: Diagnosis not present

## 2024-10-21 DIAGNOSIS — D649 Anemia, unspecified: Secondary | ICD-10-CM | POA: Diagnosis not present

## 2024-10-21 DIAGNOSIS — R531 Weakness: Secondary | ICD-10-CM | POA: Diagnosis not present

## 2024-10-21 DIAGNOSIS — T451X5A Adverse effect of antineoplastic and immunosuppressive drugs, initial encounter: Secondary | ICD-10-CM | POA: Diagnosis not present

## 2024-10-21 DIAGNOSIS — J3089 Other allergic rhinitis: Secondary | ICD-10-CM

## 2024-10-21 DIAGNOSIS — I10 Essential (primary) hypertension: Secondary | ICD-10-CM

## 2024-10-21 LAB — CBC
HCT: 18.9 % — ABNORMAL LOW (ref 36.0–46.0)
Hemoglobin: 6.1 g/dL — CL (ref 12.0–15.0)
MCH: 27.7 pg (ref 26.0–34.0)
MCHC: 32.3 g/dL (ref 30.0–36.0)
MCV: 85.9 fL (ref 80.0–100.0)
Platelets: 20 K/uL — CL (ref 150–400)
RBC: 2.2 MIL/uL — ABNORMAL LOW (ref 3.87–5.11)
RDW: 15.9 % — ABNORMAL HIGH (ref 11.5–15.5)
WBC: 12.3 K/uL — ABNORMAL HIGH (ref 4.0–10.5)
nRBC: 14.2 % — ABNORMAL HIGH (ref 0.0–0.2)

## 2024-10-21 LAB — PREPARE RBC (CROSSMATCH)

## 2024-10-21 LAB — KAPPA/LAMBDA LIGHT CHAINS
Kappa free light chain: 4.5 mg/L (ref 3.3–19.4)
Kappa, lambda light chain ratio: 0.57 (ref 0.26–1.65)
Lambda free light chains: 7.9 mg/L (ref 5.7–26.3)

## 2024-10-21 LAB — BASIC METABOLIC PANEL WITH GFR
Anion gap: 9 (ref 5–15)
BUN: 9 mg/dL (ref 6–20)
CO2: 25 mmol/L (ref 22–32)
Calcium: 12.1 mg/dL — ABNORMAL HIGH (ref 8.9–10.3)
Chloride: 106 mmol/L (ref 98–111)
Creatinine, Ser: 0.48 mg/dL (ref 0.44–1.00)
GFR, Estimated: >60 mL/min (ref >60)
Glucose, Bld: 104 mg/dL — ABNORMAL HIGH (ref 70–99)
Potassium: 3.1 mmol/L — ABNORMAL LOW (ref 3.5–5.1)
Sodium: 139 mmol/L (ref 135–145)

## 2024-10-21 LAB — HEMOGLOBIN AND HEMATOCRIT, BLOOD
HCT: 25 % — ABNORMAL LOW (ref 36.0–46.0)
Hemoglobin: 8.5 g/dL — ABNORMAL LOW (ref 12.0–15.0)

## 2024-10-21 MED ORDER — SODIUM CHLORIDE 0.9% IV SOLUTION: Freq: Once | INTRAVENOUS | Status: AC

## 2024-10-21 MED ORDER — ZOLEDRONIC ACID 4 MG/100ML IV SOLN
4.0000 mg | Freq: Once | INTRAVENOUS | Status: AC
  Administered 2024-10-21: 21:00:00 4 mg via INTRAVENOUS
  Filled 2024-10-21: qty 100

## 2024-10-21 MED ORDER — ZOLEDRONIC ACID 4 MG/5ML IV CONC: 4.0000 mg | Freq: Once | INTRAVENOUS | Status: DC

## 2024-10-21 MED ORDER — SODIUM CHLORIDE 0.9% FLUSH: 10.0000 mL | INTRAVENOUS | Status: DC | PRN

## 2024-10-21 MED ORDER — CALCITONIN (SALMON) 200 UNIT/ACT NA SOLN
1.0000 | Freq: Every day | NASAL | Status: DC
  Administered 2024-10-21 – 2024-11-05 (×16): 1 via NASAL
  Filled 2024-10-21: qty 3.7

## 2024-10-21 MED ORDER — POTASSIUM CHLORIDE 20 MEQ PO PACK
40.0000 meq | PACK | Freq: Two times a day (BID) | ORAL | Status: AC
  Administered 2024-10-21 (×2): 40 meq via ORAL
  Filled 2024-10-21 (×2): qty 2

## 2024-10-21 MED ORDER — SODIUM CHLORIDE 0.9 % IV SOLN: INTRAVENOUS | Status: AC

## 2024-10-21 NOTE — Therapy (Incomplete)
 " OUTPATIENT PHYSICAL THERAPY ONCOLOGY EVALUATION  Patient Name: Carla Hanson MRN: [id number] DOB:Dec 02, 1965, 58 y.o., female Today's Date: 10/21/2024  END OF SESSION:   Past Medical History:  Diagnosis Date   Alcohol  abuse    Anxiety    Back pain    Bipolar disorder (HCC)    Bronchitis    COPD exacerbation (HCC) 05/10/2016   Drug use    Edema 02/24/2024   History of blood clots    Hyperlipidemia    Hypertension    Influenza A 12/01/2018   Joint pain    Left leg DVT (HCC) 09/29/2013   Lipoma    Abdomen   LIPOMA 01/20/2008   Obesity    Occasional tremors 05/12/2019   Osteoarthritis of right knee    Other fatigue    Painful menstrual periods 01/05/2012   Pneumonia    Prediabetes    Seizure (HCC)    Shortness of breath on exertion    Stroke (HCC)    Tobacco abuse    Past Surgical History:  Procedure Laterality Date   CESAREAN SECTION     CYSTECTOMY     LIPOMA EXCISION  03/2011   ORIF ANKLE FRACTURE  03/13/2012   Procedure: OPEN REDUCTION INTERNAL FIXATION (ORIF) ANKLE FRACTURE;  Surgeon: Lynwood FORBES Better, MD;  Location: [facility];  Service: Orthopedics;  Laterality: Right;   TUBAL LIGATION     Patient Active Problem List   Diagnosis Date Noted   Anemia due to antineoplastic agent 10/21/2024   Symptomatic anemia 10/20/2024   Moderate protein-calorie malnutrition 09/21/2024   Chemotherapy-induced neutropenia 09/09/2024   Folate deficiency 09/09/2024   Pancytopenia (HCC) 09/08/2024   History of compression fracture of spine 04/12/2024   Electrolyte abnormality 02/10/2024   Edema leg 02/10/2024   Hypercalcemia of malignancy 02/02/2024   Multiple myeloma not having achieved remission (HCC) 02/02/2024   Hypokalemia 01/30/2024   BMI 40.0-44.9, adult (HCC) 12/18/2022   Chronic pain of both shoulders 09/05/2022   Vitamin D  deficiency 12/19/2020   Palpitation 12/03/2019   History of intracranial hemorrhage 08/02/2019   History of DVT of lower extremity 08/02/2019    History of seizure 08/02/2019   COPD, moderate (HCC) 12/11/2018   History of hemorrhagic cerebrovascular accident (CVA) without residual deficits 08/13/2017   Iron deficiency anemia 12/12/2016   Pre-diabetes 03/02/2013   History of pulmonary embolism 05/05/2012   Former tobacco use 08/23/2009   Hyperlipidemia 04/30/2007   Essential hypertension 01/01/2007    PCP: Otto ONEIDA Fairly, MD  REFERRING PROVIDER: Onesimo Emaline Brink, MD  REFERRING DIAG: C90.00 (ICD-10-CM) - Multiple myeloma not having achieved remission (HCC)  THERAPY DIAG:  No diagnosis found.  ONSET DATE: ***  Rationale for Evaluation and Treatment: Rehabilitation  SUBJECTIVE:  SUBJECTIVE STATEMENT:  PERTINENT HISTORY: IgG lambda multiple myeloma, Status post severely elevated calcium  level 17.8 on hospital admission 09/08/24- 09/12/24 now resolved, hx dehydration and volume contraction due to hypercalcemia, Hypertension, dyslipidemia, COPD, prediabetes, multiple osseous lesions concerning for multiple myeloma.  Also noted to have left femoral intertrochanteric lesion, history of T6 compression fracture was likely related to multiple myeloma, hx of LLE DVT, OA R knee, and stroke  PAIN:  Are you having pain? {yes/no:20286} NPRS scale: ***/10 Pain location: *** Pain orientation: {Pain Orientation:25161}  PAIN TYPE: {type:313116} Pain description: {PAIN DESCRIPTION:21022940}  Aggravating factors: *** Relieving factors: ***  PRECAUTIONS: {Therapy precautions:24002}  WEIGHT BEARING RESTRICTIONS: {Yes ***/No:24003}  FALLS:  Has patient fallen in last 6 months? {fallsyesno:27318}  LIVING ENVIRONMENT: Lives with: {[facility] lives with:25569::lives with their family} Lives in: {Lives in:25570} Stairs: {yes/no:20286}; {Stairs:24000} Has  following equipment at home: {Assistive devices:23999}  OCCUPATION: ***  LEISURE: ***  HAND DOMINANCE: {RIGHT/LEFT:21944}   PRIOR LEVEL OF FUNCTION: {PLOF:24004}  PATIENT GOALS: ***   OBJECTIVE: Note: Objective measures were completed at Evaluation unless otherwise noted.  COGNITION: Overall cognitive status: {cognition:24006}   PALPATION: ***  OBSERVATIONS / OTHER ASSESSMENTS: ***  SENSATION: Light touch: {intact/deficits:24005} Stereognosis: {intact/deficits:24005} Hot/Cold: {intact/deficits:24005} Proprioception: {intact/deficits:24005}  POSTURE: ***  UPPER EXTREMITY AROM/PROM:  A/PROM RIGHT   eval   Shoulder extension   Shoulder flexion   Shoulder abduction   Shoulder internal rotation   Shoulder external rotation     (Blank rows = not tested)  A/PROM LEFT   eval  Shoulder extension   Shoulder flexion   Shoulder abduction   Shoulder internal rotation   Shoulder external rotation     (Blank rows = not tested)  CERVICAL AROM: All within normal limits:    Percent limited  Flexion   Extension   Right lateral flexion   Left lateral flexion   Right rotation   Left rotation     UPPER EXTREMITY STRENGTH: ***   LOWER EXTREMITY MMT:  A/PROM Right eval  Hip flexion   Hip extension   Hip abduction   Hip adduction   Hip internal rotation   Hip external rotation   Knee flexion   Knee extension   Ankle dorsiflexion   Ankle plantarflexion   Ankle inversion   Ankle eversion    (Blank rows = not tested)  A/PROM LEFT eval  Hip flexion   Hip extension   Hip abduction   Hip adduction   Hip internal rotation   Hip external rotation   Knee flexion   Knee extension   Ankle dorsiflexion   Ankle plantarflexion   Ankle inversion   Ankle eversion    (Blank rows = not tested)    FUNCTIONAL TESTS:  {Functional tests:24029}  GAIT: Distance walked: *** Assistive device utilized: {Assistive devices:23999} Level of assistance: {Levels of  assistance:24026} Comments: ***    QUICK DASH SURVEY: ***  TREATMENT DATE: ***   PATIENT EDUCATION:  Education details: *** Person educated: {Person educated:25204} Education method: {Education Method:25205} Education comprehension: {Education Comprehension:25206}  HOME EXERCISE PROGRAM: ***  ASSESSMENT:  CLINICAL IMPRESSION: Patient is a *** y.o. *** who was seen today for physical therapy evaluation and treatment for ***.   OBJECTIVE IMPAIRMENTS: {opptimpairments:25111}.   ACTIVITY LIMITATIONS: {activitylimitations:27494}  PARTICIPATION LIMITATIONS: {participationrestrictions:25113}  PERSONAL FACTORS: {Personal factors:25162} are also affecting patient's functional outcome.   REHAB POTENTIAL: {rehabpotential:25112}  CLINICAL DECISION MAKING: {clinical decision making:25114}  EVALUATION COMPLEXITY: {Evaluation complexity:25115}  GOALS: Goals reviewed with patient? {yes/no:20286}  SHORT TERM GOALS: Target date: ***  *** Baseline: Goal status: {GOALSTATUS:25110}  2.  *** Baseline:  Goal status: {GOALSTATUS:25110}  3.  *** Baseline:  Goal status: {GOALSTATUS:25110}  4.  *** Baseline:  Goal status: {GOALSTATUS:25110}  5.  *** Baseline:  Goal status: {GOALSTATUS:25110}  6.  *** Baseline:  Goal status: {GOALSTATUS:25110}  LONG TERM GOALS: Target date: ***  *** Baseline:  Goal status: {GOALSTATUS:25110}  2.  *** Baseline:  Goal status: {GOALSTATUS:25110}  3.  *** Baseline:  Goal status: {GOALSTATUS:25110}  4.  *** Baseline:  Goal status: {GOALSTATUS:25110}  5.  *** Baseline:  Goal status: {GOALSTATUS:25110}  6.  *** Baseline:  Goal status: {GOALSTATUS:25110}  PLAN:  PT FREQUENCY: {rehab frequency:25116}  PT DURATION: {rehab duration:25117}  PLANNED INTERVENTIONS: {rehab planned  interventions:25118::97110-Therapeutic exercises,97530- Therapeutic [phone]- Neuromuscular re-education,97535- Self Rjmz,02859- Manual therapy,Patient/Family education}  PLAN FOR NEXT SESSION: ***   Florina Lanis Carbon, PT 10/21/2024, 5:06 PM  "

## 2024-10-21 NOTE — Progress Notes (Signed)
 Date and time results received: 10/21/2024 0535  Test: Hgb and Platelets  Critical Value: Hgb-6.1, Platelets-20  Name of Provider Notified: Lavanda Horns, NP  Orders Received? Or Actions Taken?: NP ordered for one unit blood transfusion.

## 2024-10-21 NOTE — Progress Notes (Signed)
 [facility] PROGRESS NOTE    Progress Note  Carla Hanson  FMW:[id number] DOB: 1966-04-20 DOA: 10/20/2024 PCP: Anders Otto DASEN, MD     Brief Narrative:   Carla Hanson is an 58 y.o. female past medical history of DVT on aspirin  only due to intracranial hemorrhage while on Xarelto , essential hypertension, multiple myeloma on chemotherapy comes in with generalized weakness found to have symptomatic anemia, she was found in the hematologist office with a hemoglobin of 5.3  Significant Events: 10/20/2024 2 units packed red blood cells   Assessment/Plan:   Symptomatic anemia/anemia due to antineoplastic agents: Repeated hemoglobin is 5.9, she denies any signs of overt bleeding. She is status post 2 units packed red blood cells her hemoglobin is 6.1 we will go ahead and give her an additional unit to keep hemoglobin greater than 7. Check CBC posttransfusion oh. FOBT is negative.  Multiple myeloma on chemotherapy: Oncology has been to the team.  Leukocytosis: She has remained afebrile and no signs of infection. Question if this is reactive due to her severe anemia. Continue to monitor fever curve.  Hypercalcemia of malignancy: Was previously on calcitonin and Zometa  and these were discontinued. She was started on IV fluids her calcium  is coming down. Continue IV fluid strict I's and O's. Monitor volume status.  Chronic thrombocytopenia likely due to on antineoplastic agents: Likely due to chemotherapy.  Hold aspirin . Will discuss with oncology to see if we can use platelet stimulating factor there is no signs of bleeding. FOBT is negative.  Hyperlipidemia:  Continue statins.  Hypertension: Continue Cozaar .  History of pulmonary embolism Continue aspirin  twice a day.   DVT prophylaxis: scd's Family Communication:none Status is: Observation The patient remains OBS appropriate and will d/c before 2 midnights.    Code Status:     Code Status Orders   (From admission, onward)           Start     Ordered   10/20/24 1049  Full code  Continuous       Question:  By:  Answer:  Consent: discussion documented in EHR   10/20/24 1049           Code Status History     Date Active Date Inactive Code Status Order ID Comments User Context   09/08/2024 1708 09/12/2024 1738 Full Code [id number]  Sebastian Toribio GAILS, MD ED   01/30/2024 2047 02/05/2024 2004 Full Code [id number]  Tobie Jorie SAUNDERS, MD Inpatient   11/08/2016 2132 11/13/2016 2053 Full Code [id number]  Michaela Aisha SQUIBB, MD ED   10/31/2016 2025 11/03/2016 1615 Full Code [id number]  Drusilla Sabas RAMAN, MD Inpatient   05/05/2012 0158 05/05/2012 2331 Full Code [id number]  Ritch, [place] BIRCH, MD Inpatient         IV Access:   Peripheral IV   Procedures and diagnostic studies:   No results found.   Medical Consultants:   None.   Subjective:    Carla Hanson relates she feels better but tired.  Objective:    Vitals:   10/20/24 2024 10/20/24 2330 10/21/24 0152 10/21/24 0515  BP: 107/73 (!) 156/70 (!) 167/66 (!) 143/67  Pulse: 94 92 85 82  Resp: 18 18 17 17   Temp: 98.4 F (36.9 C) 98.6 F (37 C) 98.1 F (36.7 C) 98.1 F (36.7 C)  TempSrc:  Oral    SpO2:  98% 97% 97%  Weight:      Height:       SpO2: 97 %  Intake/Output Summary (Last 24 hours) at 10/21/2024 9297 Last data filed at 10/21/2024 0600 Gross per 24 hour  Intake 3023.57 ml  Output 300 ml  Net 2723.57 ml   Filed Weights   10/20/24 1548  Weight: 92.6 kg    Exam: General exam: In no acute distress. Respiratory system: Good air movement and clear to auscultation. Cardiovascular system: S1 & S2 heard, RRR. No JVD. Gastrointestinal system: Abdomen is nondistended, soft and nontender.  Extremities: No pedal edema. Skin: No rashes, lesions or ulcers Psychiatry: Judgement and insight appear normal. Mood & affect appropriate.    Data Reviewed:    Labs: Basic Metabolic Panel: Recent Labs  Lab  10/20/24 0817 10/21/24 0504  NA 140 139  K 3.5 3.1*  CL 103 106  CO2 22 25  GLUCOSE 135* 104*  BUN 9 9  CREATININE 0.59 0.48  CALCIUM  12.8* 12.1*   GFR Estimated Creatinine Clearance: 81.3 mL/min (by C-G formula based on SCr of 0.48 mg/dL). Liver Function Tests: Recent Labs  Lab 10/20/24 0817  AST 52*  ALT 11  ALKPHOS 172*  BILITOT 0.9  PROT 8.2*  ALBUMIN 3.3*   No results for input(s): LIPASE, AMYLASE in the last 168 hours. No results for input(s): AMMONIA in the last 168 hours. Coagulation profile No results for input(s): INR, PROTIME in the last 168 hours. COVID-19 Labs  No results for input(s): DDIMER, FERRITIN, LDH, CRP in the last 72 hours.  Lab Results  Component Value Date   SARSCOV2NAA NEGATIVE 09/08/2024    CBC: Recent Labs  Lab 10/20/24 0817 10/21/24 0504  WBC 17.1* 12.3*  NEUTROABS 7.0  --   HGB 5.9* 6.1*  HCT 18.1* 18.9*  MCV 83.0 85.9  PLT 31* 20*   Cardiac Enzymes: No results for input(s): CKTOTAL, CKMB, CKMBINDEX, TROPONINI in the last 168 hours. BNP (last 3 results) No results for input(s): PROBNP in the last 8760 hours. CBG: No results for input(s): GLUCAP in the last 168 hours. D-Dimer: No results for input(s): DDIMER in the last 72 hours. Hgb A1c: No results for input(s): HGBA1C in the last 72 hours. Lipid Profile: No results for input(s): CHOL, HDL, LDLCALC, TRIG, CHOLHDL, LDLDIRECT in the last 72 hours. Thyroid  function studies: No results for input(s): TSH, T4TOTAL, T3FREE, THYROIDAB in the last 72 hours.  Invalid input(s): FREET3 Anemia work up: No results for input(s): VITAMINB12, FOLATE, FERRITIN, TIBC, IRON, RETICCTPCT in the last 72 hours. Sepsis Labs: Recent Labs  Lab 10/20/24 0817 10/21/24 0504  WBC 17.1* 12.3*   Microbiology No results found for this or any previous visit (from the past 240 hours).   Medications:    sodium chloride     Intravenous Once   sodium chloride    Intravenous Once   aspirin  EC  81 mg Oral Daily   ferrous sulfate   325 mg Oral Q breakfast   folic acid   1 mg Oral BID   loratadine   10 mg Oral QHS   losartan   25 mg Oral Daily   pantoprazole   40 mg Oral Q0600   potassium chloride  SA  20 mEq Oral BID   thiamine   100 mg Oral Daily   Continuous Infusions:    LOS: 0 days   Erle Odell Castor  [facility]  10/21/2024, 7:02 AM

## 2024-10-21 NOTE — Plan of Care (Signed)
 ?  Problem: Clinical Measurements: ?Goal: Ability to maintain clinical measurements within normal limits will improve ?Outcome: Progressing ?Goal: Will remain free from infection ?Outcome: Progressing ?Goal: Diagnostic test results will improve ?Outcome: Progressing ?  ?

## 2024-10-21 NOTE — Progress Notes (Addendum)
 Carla Hanson   DOB:Aug 15, 1966   FM#:[id number]      ASSESSMENT & PLAN:  Carla Hanson. Carla Hanson is a 58 year old female patient admitted on 10/20/2024 with complaints of weakness.  Oncologic history is significant for multiple myeloma with bone mets.  Medical oncology following.  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  Bone marrow showed hypercellular bone marrow 60% involved by plasma cell neoplasm. - PET scan was done 02/12/2024 and showed extensive lytic lesions throughout the skeleton. - Status post daratumumab /Velcade /Dex which was started on 03/12/2024.  --Switched to Kyprolis /Cytoxan /Dex on 09/15/2024. - Medical oncology/Dr. Onesimo following.  Generalized weakness Shortness of breath - Likely secondary to malignancy and chronic illness - Patient was seen in outpatient oncology office on 10/20/2024 at which time she complained of feeling extremely fatigued and lethargic, reported being in bed and needing assistance with ADLs. - Due to her low blood counts and poor performance status, she was sent to ED for further evaluation and management. - Patient reports today that she is feeling slightly better since she has started receiving PRBC transfusion although still feels very tired. - Continue supportive care  Anemia - Likely secondary to malignancy - Patient admitted 10/20/2024 with hemoglobin 5.9.  Hemoglobin today is 6.1.  Seen with PRBC transfusing and being tolerated well. - Recommend PRBC transfusion for hemoglobin <7.0 - Continue to monitor CBC with differential  Acute on chronic thrombocytopenia - Platelets low 20K - Recommend PRBC transfusion for platelets <10K or <50 K with acute bleeding. - Monitor CBC with differential  Hypercalcemia - Elevated calcium  12.1 - Patient receives Zometa  every 28 days, last received 09/22/2024 -Continue IV fluids as ordered, monitor closely   Code Status Full   Subjective:  Patient seen awake and alert  laying in bed receiving PRBC transfusion, tolerating well.  Reports that she has been progressively tired and short of breath.  Admits to poor appetite.  Admits to difficulty ambulating due to feeling weak.  No other acute distress is noted.  Objective:   Intake/Output Summary (Last 24 hours) at 10/21/2024 1121 Last data filed at 10/21/2024 1000 Gross per 24 hour  Intake 3023.57 ml  Output 600 ml  Net 2423.57 ml     PHYSICAL EXAMINATION: ECOG PERFORMANCE STATUS: 3 - Symptomatic, >50% confined to bed  Vitals:   10/21/24 1013 10/21/24 1033  BP: (!) 141/74 (!) 146/72  Pulse: 88 93  Resp: 20 20  Temp: 98.5 F (36.9 C) 97.7 F (36.5 C)  SpO2: 100%    Filed Weights   10/20/24 1548  Weight: 204 lb 2.3 oz (92.6 kg)    GENERAL: alert, +chronically ill-appearing  SKIN: skin color, texture, turgor are normal, no rashes or significant lesions EYES: normal, conjunctiva are pink and non-injected, sclera clear OROPHARYNX: no exudate, no erythema and lips, buccal mucosa, and tongue normal  NECK: supple, thyroid  normal size, non-tender, without nodularity LYMPH: no palpable lymphadenopathy in the cervical, axillary or inguinal LUNGS: clear to auscultation and percussion with normal breathing effort HEART: regular rate & rhythm and no murmurs and no lower extremity edema ABDOMEN: abdomen soft, non-tender and normal bowel sounds MUSCULOSKELETAL: no cyanosis of digits and no clubbing  PSYCH: alert & oriented x 3 with fluent speech NEURO: no focal motor/sensory deficits   All questions were answered. The patient knows to call the clinic with any problems, questions or concerns.   The total time spent in the appointment was 40 minutes encounter with patient including review of chart  and various tests results, discussions about plan of care and coordination of care plan  Carla Carla Brunner, NP 10/21/2024 11:21 AM    Labs Reviewed:  Lab Results  Component Value Date   WBC 12.3 (H)  10/21/2024   HGB 6.1 (LL) 10/21/2024   HCT 18.9 (L) 10/21/2024   MCV 85.9 10/21/2024   PLT 20 (LL) 10/21/2024   Recent Labs    09/22/24 1228 09/27/24 1412 10/20/24 0817 10/21/24 0504  NA 138 139 140 139  K 3.9 3.9 3.5 3.1*  CL 99 105 103 106  CO2 25 23 22 25   GLUCOSE 129* 132* 135* 104*  BUN 10 9 9 9   CREATININE 0.62 0.56 0.59 0.48  CALCIUM  12.3* 10.4* 12.8* 12.1*  GFRNONAA >60 >60 >60 >60  PROT 7.7 7.8 8.2*  --   ALBUMIN 3.6 3.5 3.3*  --   AST 96* 28 52*  --   ALT 11 8 11   --   ALKPHOS 182* 154* 172*  --   BILITOT 0.5 0.5 0.9  --     Studies Reviewed:   No results found.   ADDENDUM  .Patient was Personally and independently interviewed, examined and relevant elements of the history of present illness were reviewed in details and an assessment and plan was created. All elements of the patient's history of present illness , assessment and plan were discussed in details with Carla Rouson NP. The above documentation reflects our combined findings assessment and plan.   Starting to feeling a little better with PRBC transfusion and IVF's. Zometa  4mg  x 1 and calcitonin nasal spray for hypercalcemia in addition to IVF SFLC WNL , myeloma labs pending, outpatient PET/CT been delayed by patient on multiple occassions. PT/OT/nutritional therapy evaluation. Transfuse prbc to hgb of 8 based on patients anemia tolerance and functional status.  If patient is progressing on Carfilzomib  + Cytoxan  or cannot tolerate it  -- depending on performance status might need to consider BITE therapy. Continued goals of care discussion. Patient wants to see how she is doing and feeling to determine her choice of next treatments.  Carla Saran MD MS  .The total time spent in the appointment was 50 minutes* .  All of the patient's questions were answered with apparent satisfaction. The patient knows to call the clinic with any problems, questions or concerns.   Carla Saran MD MS AAHIVMS [facility]  [facility] Hematology/Oncology Physician [facility]  .*Total Encounter Time as defined by the Centers for Medicare and Medicaid Services includes, in addition to the face-to-face time of a patient visit (documented in the note above) non-face-to-face time: obtaining and reviewing outside history, ordering and reviewing medications, tests or procedures, care coordination (communications with other health care professionals or caregivers) and documentation in the medical record.

## 2024-10-21 NOTE — Progress Notes (Signed)
°   10/21/24 1050  TOC Brief Assessment  Insurance and Status Reviewed  Patient has primary care physician Yes  Home environment has been reviewed home with parent  Prior level of function: independent  Prior/Current Home Services No current home services  Social Drivers of Health Review SDOH reviewed no interventions necessary  Readmission risk has been reviewed Yes  Transition of care needs no transition of care needs at this time

## 2024-10-21 NOTE — Plan of Care (Signed)
°  Problem: Safety: Goal: Ability to remain free from injury will improve Outcome: Progressing   Problem: Pain Managment: Goal: General experience of comfort will improve and/or be controlled Outcome: Progressing   Problem: Clinical Measurements: Goal: Diagnostic test results will improve Outcome: Progressing

## 2024-10-22 DIAGNOSIS — T451X5A Adverse effect of antineoplastic and immunosuppressive drugs, initial encounter: Secondary | ICD-10-CM | POA: Diagnosis not present

## 2024-10-22 DIAGNOSIS — I4892 Unspecified atrial flutter: Secondary | ICD-10-CM | POA: Diagnosis not present

## 2024-10-22 DIAGNOSIS — D509 Iron deficiency anemia, unspecified: Secondary | ICD-10-CM | POA: Diagnosis not present

## 2024-10-22 DIAGNOSIS — D61818 Other pancytopenia: Secondary | ICD-10-CM | POA: Diagnosis not present

## 2024-10-22 DIAGNOSIS — D6481 Anemia due to antineoplastic chemotherapy: Secondary | ICD-10-CM | POA: Diagnosis not present

## 2024-10-22 DIAGNOSIS — D6959 Other secondary thrombocytopenia: Secondary | ICD-10-CM | POA: Diagnosis not present

## 2024-10-22 DIAGNOSIS — E785 Hyperlipidemia, unspecified: Secondary | ICD-10-CM | POA: Diagnosis not present

## 2024-10-22 DIAGNOSIS — C9000 Multiple myeloma not having achieved remission: Secondary | ICD-10-CM | POA: Diagnosis not present

## 2024-10-22 DIAGNOSIS — I10 Essential (primary) hypertension: Secondary | ICD-10-CM | POA: Diagnosis not present

## 2024-10-22 DIAGNOSIS — I119 Hypertensive heart disease without heart failure: Secondary | ICD-10-CM | POA: Diagnosis not present

## 2024-10-22 DIAGNOSIS — E66812 Obesity, class 2: Secondary | ICD-10-CM | POA: Diagnosis not present

## 2024-10-22 DIAGNOSIS — D649 Anemia, unspecified: Secondary | ICD-10-CM | POA: Diagnosis not present

## 2024-10-22 DIAGNOSIS — J90 Pleural effusion, not elsewhere classified: Secondary | ICD-10-CM | POA: Diagnosis not present

## 2024-10-22 DIAGNOSIS — J3089 Other allergic rhinitis: Secondary | ICD-10-CM | POA: Diagnosis not present

## 2024-10-22 LAB — BPAM RBC
Blood Product Expiration Date: 202601102359
Blood Product Expiration Date: 202601102359
Blood Product Expiration Date: 202601102359
Blood Product Expiration Date: 202601102359
ISSUE DATE / TIME: 202512181006
ISSUE DATE / TIME: 202512181507
ISSUE DATE / TIME: 202512171223
ISSUE DATE / TIME: 202512171958
Unit Type and Rh: 6200
Unit Type and Rh: 6200
Unit Type and Rh: 6200
Unit Type and Rh: 6200

## 2024-10-22 LAB — CBC WITH DIFFERENTIAL/PLATELET
Abs Immature Granulocytes: 1.55 K/uL — ABNORMAL HIGH (ref 0.00–0.07)
Basophils Absolute: 0.3 K/uL — ABNORMAL HIGH (ref 0.0–0.1)
Basophils Relative: 2 %
Eosinophils Absolute: 0.2 K/uL (ref 0.0–0.5)
Eosinophils Relative: 1 %
HCT: 27.5 % — ABNORMAL LOW (ref 36.0–46.0)
Hemoglobin: 9.2 g/dL — ABNORMAL LOW (ref 12.0–15.0)
Immature Granulocytes: 10 %
Lymphocytes Relative: 29 %
Lymphs Abs: 4.4 K/uL — ABNORMAL HIGH (ref 0.7–4.0)
MCH: 28.2 pg (ref 26.0–34.0)
MCHC: 33.5 g/dL (ref 30.0–36.0)
MCV: 84.4 fL (ref 80.0–100.0)
Monocytes Absolute: 2.2 K/uL — ABNORMAL HIGH (ref 0.1–1.0)
Monocytes Relative: 14 %
Neutro Abs: 6.7 K/uL (ref 1.7–7.7)
Neutrophils Relative %: 44 %
Platelets: 22 K/uL — CL (ref 150–400)
RBC: 3.26 MIL/uL — ABNORMAL LOW (ref 3.87–5.11)
RDW: 16.2 % — ABNORMAL HIGH (ref 11.5–15.5)
WBC: 15.3 K/uL — ABNORMAL HIGH (ref 4.0–10.5)
nRBC: 18.5 % — ABNORMAL HIGH (ref 0.0–0.2)

## 2024-10-22 LAB — TYPE AND SCREEN
ABO/RH(D): A POS
Antibody Screen: NEGATIVE
Unit division: 0
Unit division: 0
Unit division: 0
Unit division: 0

## 2024-10-22 LAB — BASIC METABOLIC PANEL WITH GFR
Anion gap: 11 (ref 5–15)
BUN: 7 mg/dL (ref 6–20)
CO2: 22 mmol/L (ref 22–32)
Calcium: 12.5 mg/dL — ABNORMAL HIGH (ref 8.9–10.3)
Chloride: 107 mmol/L (ref 98–111)
Creatinine, Ser: 0.45 mg/dL (ref 0.44–1.00)
GFR, Estimated: >60 mL/min
Glucose, Bld: 89 mg/dL (ref 70–99)
Potassium: 3.5 mmol/L (ref 3.5–5.1)
Sodium: 140 mmol/L (ref 135–145)

## 2024-10-22 MED ORDER — POTASSIUM CHLORIDE CRYS ER 20 MEQ PO TBCR
40.0000 meq | EXTENDED_RELEASE_TABLET | Freq: Two times a day (BID) | ORAL | Status: AC
  Administered 2024-10-22: 40 meq via ORAL
  Filled 2024-10-22 (×2): qty 2

## 2024-10-22 MED ORDER — SODIUM CHLORIDE 0.9 % IV SOLN: INTRAVENOUS | Status: DC

## 2024-10-22 NOTE — Progress Notes (Signed)
 Carla Hanson   DOB:Nov 16, 1965   FM#:[id number]    DOS 10/22/2024  Subjective:  Patient was seen in hematologic followup for myeloma. Notes some improved energy after transfusion but still bed bound. Calcium  levels still elevated. Had another detailed goals of care discussion. After previously refusing now agreeable to bone marrow exam rpt, outpatient pet and referral to [facility] for consideration of BITE therapy.   Objective:   Intake/Output Summary (Last 24 hours) at 10/22/2024 2210 Last data filed at 10/22/2024 2150 Gross per 24 hour  Intake 1427.69 ml  Output 450 ml  Net 977.69 ml     PHYSICAL EXAMINATION: ECOG PERFORMANCE STATUS: 3 - Symptomatic, >50% confined to bed  Vitals:   10/22/24 1453 10/22/24 2030  BP:  (!) 154/76  Pulse:  96  Resp:  19  Temp:  98.6 F (37 C)  SpO2: 96% 97%   Filed Weights   10/20/24 1548  Weight: 204 lb 2.3 oz (92.6 kg)    GENERAL: alert, +chronically ill-appearing  LYMPH: no palpable lymphadenopathy in the cervical, axillary or inguinal LUNGS: clear to auscultation and percussion with normal breathing effort HEART: regular rate & rhythm and no murmurs and no lower extremity edema ABDOMEN: abdomen soft, non-tender and normal bowel sounds MUSCULOSKELETAL: no cyanosis of digits and no clubbing  PSYCH: alert & oriented x 3 with fluent speech NEURO: no focal motor/sensory deficits  Labs Reviewed:  SABRA    Latest Ref Rng & Units 10/22/2024   10:38 AM 10/21/2024   10:46 PM 10/21/2024    5:04 AM  CBC  WBC 4.0 - 10.5 K/uL 15.3   12.3   Hemoglobin 12.0 - 15.0 g/dL 9.2  8.5  6.1   Hematocrit 36.0 - 46.0 % 27.5  25.0  18.9   Platelets 150 - 400 K/uL 22   20   .    Latest Ref Rng & Units 10/22/2024   10:38 AM 10/21/2024    5:04 AM 10/20/2024    8:17 AM  CMP  Glucose 70 - 99 mg/dL 89  895  864   BUN 6 - 20 mg/dL 7  9  9    Creatinine 0.44 - 1.00 mg/dL 9.54  9.51  9.40   Sodium 135 - 145 mmol/L 140  139  140   Potassium  3.5 - 5.1 mmol/L 3.5  3.1  3.5   Chloride 98 - 111 mmol/L 107  106  103   CO2 22 - 32 mmol/L 22  25  22    Calcium  8.9 - 10.3 mg/dL 87.4  87.8  87.1   Total Protein 6.5 - 8.1 g/dL   8.2   Total Bilirubin 0.0 - 1.2 mg/dL   0.9   Alkaline Phos 38 - 126 U/L   172   AST 15 - 41 U/L   52   ALT 0 - 44 U/L   11        ASSESSMENT & PLAN:   Carla Hanson is a 58 year old female patient admitted on 10/20/2024 with complaints of weakness.  Oncologic history is significant for multiple myeloma with bone mets.  Medical oncology following.  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  Bone marrow showed hypercellular bone marrow 60% involved by plasma cell neoplasm. 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 No IMID due to hx of PE and SAH on anticoagulation. -started on Carfilzomib /Cytoxan /Dexamethasone  but has not been able to f/u with compliance  and has missed many treatments due to personal choice, fatigue/feeling poorly/depression. -we discussed that worsening anemia and thrombocytopenia and hypercalcemia is concern for myeloma progression. Her lack of compliance with treatment and poor functional status and limited social support and baseline co-morbids has made it difficult to pursue current treatment or a more aggressive course of treatment -will get BM Bx --patient agreeable -PET/CT as outpatient (ordered 10/3 and patient has yet not completed this. Transfuse prn for PRBC< 8 and PLT<10k or if bleeding -continue outpatient Carfilzomib  if able and hold cytoxan  -referral to [facility] Dr Fernande or Edson Ehrlich for consideration of BITE therapy steup and then can continue here. -discussed goals of care and also gave patient option for consideration of BSC through hospice which the patient currently declined as an option. -LIsa Rouson and Dr Tina will follow.  Generalized weakness Shortness of breath - Likely  secondary to malignancy and chronic illness - Patient was seen in outpatient oncology office on 10/20/2024 at which time she complained of feeling extremely fatigued and lethargic, reported being in bed and needing assistance with ADLs. - Due to her low blood counts and poor performance status, she was sent to ED for further evaluation and management. - Patient reports today that she is feeling slightly better since she has started receiving PRBC transfusion although still feels very tired. - Continue supportive care  Anemia - due to myeloma and treatment - Likely secondary to malignancy - Patient admitted 10/20/2024 with hemoglobin 5.9.  Hemoglobin today is 6.1.  Seen with PRBC transfusing and being tolerated well. - Recommend PRBC transfusion for hemoglobin <7.0 - Continue to monitor CBC with differential  Acute on chronic thrombocytopenia - Platelets low 20K - Recommend PRBC transfusion for platelets <10K or <50 K with acute bleeding. - Monitor CBC with differential  Hypercalcemia - Elevated calcium  12.1 - Patient receivedsZometa every 28 days, last received 09/22/2024 and received Zometa  in the hospital on 12/18 -continue Calcitonin -Continue IV fluids as ordered, monitor closely -outpatient PET to evaluate myeloma status  Code Status Full  .The total time spent in the appointment was 35 minutes* .  All of the patient's questions were answered with apparent satisfaction. The patient knows to call the clinic with any problems, questions or concerns.   Carla Saran MD MS AAHIVMS [facility] Hematology/Oncology Physician [facility]  .*Total Encounter Time as defined by the Centers for Medicare and Medicaid Services includes, in addition to the face-to-face time of a patient visit (documented in the note above) non-face-to-face time: obtaining and reviewing outside history, ordering and reviewing medications, tests or procedures, care coordination (communications with  other health care professionals or caregivers) and documentation in the medical record.

## 2024-10-22 NOTE — Progress Notes (Addendum)
 Received call from CCMD staff reporting pt Hr dropped to 37  and had beats of 2nd degree type 2 AVB . Pt denies chest pain.Notified on call A. Chavez,NP with new order for EKG.  10/22/24 0223  Vitals  Temp 98.4 F (36.9 C)  Temp Source Oral  BP (!) 142/74  MAP (mmHg) 94  BP Location Right Arm  BP Method Automatic  Patient Position (if appropriate) Lying  Pulse Rate 90  Pulse Rate Source Monitor  Resp 16  MEWS COLOR  MEWS Score Color Green  Oxygen Therapy  SpO2 96 %  O2 Device Room Air  MEWS Score  MEWS Temp 0  MEWS Systolic 0  MEWS Pulse 0  MEWS RR 0  MEWS LOC 0  MEWS Score 0

## 2024-10-22 NOTE — Progress Notes (Signed)
 [facility] PROGRESS NOTE    Progress Note  Carla Hanson  FMW:[id number] DOB: 07-Nov-1965 DOA: 10/20/2024 PCP: Anders Otto DASEN, MD     Brief Narrative:   Carla Hanson is an 58 y.o. 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  Was found to have a big bruise on her right upper extremity  Significant Events: 10/20/2024 2 units packed red blood cells  Assessment/Plan:   Symptomatic anemia/anemia due to antineoplastic agents: Repeated hemoglobin is 5.9, she denies any signs of overt bleeding. Status post 3 units of packed red blood cells hemoglobin this morning is 8.5. FOBT is negative.  She relates she feels better this morning after transfusions.  Multiple myeloma on chemotherapy: Oncology has been to the team.  Leukocytosis: She has remained afebrile and no signs of infection. Question if this is reactive due to her severe anemia. Has remained afebrile.  Hypercalcemia of malignancy: Was previously on calcitonin and Zometa  and these were discontinued. She was started on IV fluids her calcium  is coming down. Metabolic panel is pending this morning.  Chronic thrombocytopenia likely due to on antineoplastic agents: Likely due to chemotherapy.  Hold aspirin . Will discuss with oncology to see if we can use platelet stimulating factor there is no signs of bleeding. FOBT is negative.  Hyperlipidemia:  Continue statins.  Hypertension: Continue Cozaar .  History of pulmonary embolism Continue aspirin  twice a day.   DVT prophylaxis: scd's Family Communication:none Status is: Observation The patient remains OBS appropriate and will d/c before 2 midnights.    Code Status:     Code Status Orders  (From admission,  onward)           Start     Ordered   10/20/24 1049  Full code  Continuous       Question:  By:  Answer:  Consent: discussion documented in EHR   10/20/24 1049           Code Status History     Date Active Date Inactive Code Status Order ID Comments User Context   09/08/2024 1708 09/12/2024 1738 Full Code [id number]  Carla Toribio GAILS, MD ED   01/30/2024 2047 02/05/2024 2004 Full Code [id number]  Carla Jorie SAUNDERS, MD Inpatient   11/08/2016 2132 11/13/2016 2053 Full Code [id number]  Carla Aisha SQUIBB, MD ED   10/31/2016 2025 11/03/2016 1615 Full Code [id number]  Carla Sabas RAMAN, MD Inpatient   05/05/2012 0158 05/05/2012 2331 Full Code [id number]  Carla, [place] BIRCH, MD Inpatient         IV Access:   Peripheral IV   Procedures and diagnostic studies:   No results found.   Medical Consultants:   None.   Subjective:    [name] feels better after transfusion.  Objective:    Vitals:   10/21/24 1905 10/21/24 2100 10/22/24 0223 10/22/24 0547  BP: (!) 184/69 (!) 169/74 (!) 142/74 (!) 158/75  Pulse: 89 91 90 88  Resp: 18 17 16 18   Temp: 98.3 F (36.8 C) 98.1 F (36.7 C) 98.4 F (36.9 C) 98 F (36.7 C)  TempSrc: Oral Oral Oral Oral  SpO2: 99% 98% 96% 98%  Weight:      Height:       SpO2: 98 %   Intake/Output Summary (Last 24  hours) at 10/22/2024 1006 Last data filed at 10/22/2024 0600 Gross per 24 hour  Intake 974.41 ml  Output 600 ml  Net 374.41 ml   Filed Weights   10/20/24 1548  Weight: 92.6 kg    Exam: General exam: In no acute distress. Respiratory system: Good air movement and clear to auscultation. Cardiovascular system: S1 & S2 heard, RRR. No JVD. Gastrointestinal system: Abdomen is nondistended, soft and nontender.  Extremities: No pedal edema. Skin: There is a bruise in her right arm Psychiatry: Judgement and insight appear normal. Mood & affect appropriate. Data Reviewed:    Labs: Basic Metabolic Panel: Recent Labs  Lab  10/20/24 0817 10/21/24 0504  NA 140 139  K 3.5 3.1*  CL 103 106  CO2 22 25  GLUCOSE 135* 104*  BUN 9 9  CREATININE 0.59 0.48  CALCIUM  12.8* 12.1*   GFR Estimated Creatinine Clearance: 81.3 mL/min (by C-G formula based on SCr of 0.48 mg/dL). Liver Function Tests: Recent Labs  Lab 10/20/24 0817  AST 52*  ALT 11  ALKPHOS 172*  BILITOT 0.9  PROT 8.2*  ALBUMIN 3.3*   No results for input(s): LIPASE, AMYLASE in the last 168 hours. No results for input(s): AMMONIA in the last 168 hours. Coagulation profile No results for input(s): INR, PROTIME in the last 168 hours. COVID-19 Labs  No results for input(s): DDIMER, FERRITIN, LDH, CRP in the last 72 hours.  Lab Results  Component Value Date   SARSCOV2NAA NEGATIVE 09/08/2024    CBC: Recent Labs  Lab 10/20/24 0817 10/21/24 0504 10/21/24 2246  WBC 17.1* 12.3*  --   NEUTROABS 7.0  --   --   HGB 5.9* 6.1* 8.5*  HCT 18.1* 18.9* 25.0*  MCV 83.0 85.9  --   PLT 31* 20*  --    Cardiac Enzymes: No results for input(s): CKTOTAL, CKMB, CKMBINDEX, TROPONINI in the last 168 hours. BNP (last 3 results) No results for input(s): PROBNP in the last 8760 hours. CBG: No results for input(s): GLUCAP in the last 168 hours. D-Dimer: No results for input(s): DDIMER in the last 72 hours. Hgb A1c: No results for input(s): HGBA1C in the last 72 hours. Lipid Profile: No results for input(s): CHOL, HDL, LDLCALC, TRIG, CHOLHDL, LDLDIRECT in the last 72 hours. Thyroid  function studies: No results for input(s): TSH, T4TOTAL, T3FREE, THYROIDAB in the last 72 hours.  Invalid input(s): FREET3 Anemia work up: No results for input(s): VITAMINB12, FOLATE, FERRITIN, TIBC, IRON, RETICCTPCT in the last 72 hours. Sepsis Labs: Recent Labs  Lab 10/20/24 0817 10/21/24 0504  WBC 17.1* 12.3*   Microbiology No results found for this or any previous visit (from the past 240  hours).   Medications:    sodium chloride    Intravenous Once   calcitonin (salmon)  1 spray Alternating Nares Daily   ferrous sulfate   325 mg Oral Q breakfast   folic acid   1 mg Oral BID   loratadine   10 mg Oral QHS   losartan   25 mg Oral Daily   pantoprazole   40 mg Oral Q0600   thiamine   100 mg Oral Daily   Continuous Infusions:    LOS: 0 days   Erle Odell Castor  [facility]  10/22/2024, 10:06 AM

## 2024-10-23 DIAGNOSIS — E538 Deficiency of other specified B group vitamins: Secondary | ICD-10-CM | POA: Diagnosis not present

## 2024-10-23 DIAGNOSIS — D649 Anemia, unspecified: Secondary | ICD-10-CM | POA: Diagnosis not present

## 2024-10-23 DIAGNOSIS — C9000 Multiple myeloma not having achieved remission: Secondary | ICD-10-CM | POA: Diagnosis not present

## 2024-10-23 DIAGNOSIS — D696 Thrombocytopenia, unspecified: Secondary | ICD-10-CM | POA: Diagnosis not present

## 2024-10-23 DIAGNOSIS — T451X5A Adverse effect of antineoplastic and immunosuppressive drugs, initial encounter: Secondary | ICD-10-CM | POA: Diagnosis not present

## 2024-10-23 DIAGNOSIS — J3089 Other allergic rhinitis: Secondary | ICD-10-CM | POA: Diagnosis not present

## 2024-10-23 DIAGNOSIS — D6481 Anemia due to antineoplastic chemotherapy: Secondary | ICD-10-CM | POA: Diagnosis not present

## 2024-10-23 DIAGNOSIS — I10 Essential (primary) hypertension: Secondary | ICD-10-CM | POA: Diagnosis not present

## 2024-10-23 LAB — MULTIPLE MYELOMA PANEL, SERUM
Albumin SerPl Elph-Mcnc: 2.7 g/dL — ABNORMAL LOW (ref 2.9–4.4)
Albumin/Glob SerPl: 0.7 (ref 0.7–1.7)
Alpha 1: 0.5 g/dL — ABNORMAL HIGH (ref 0.0–0.4)
Alpha2 Glob SerPl Elph-Mcnc: 1 g/dL (ref 0.4–1.0)
B-Globulin SerPl Elph-Mcnc: 1.2 g/dL (ref 0.7–1.3)
Gamma Glob SerPl Elph-Mcnc: 1.9 g/dL — ABNORMAL HIGH (ref 0.4–1.8)
Globulin, Total: 4.5 g/dL — ABNORMAL HIGH (ref 2.2–3.9)
IgA: 10 mg/dL — ABNORMAL LOW (ref 87–352)
IgG (Immunoglobin G), Serum: 2493 mg/dL — ABNORMAL HIGH (ref 586–1602)
IgM (Immunoglobulin M), Srm: 13 mg/dL — ABNORMAL LOW (ref 26–217)
M Protein SerPl Elph-Mcnc: 1.8 g/dL — ABNORMAL HIGH
Total Protein ELP: 7.2 g/dL (ref 6.0–8.5)

## 2024-10-23 LAB — BASIC METABOLIC PANEL WITH GFR
Anion gap: 11 (ref 5–15)
BUN: 7 mg/dL (ref 6–20)
CO2: 22 mmol/L (ref 22–32)
Calcium: 11.8 mg/dL — ABNORMAL HIGH (ref 8.9–10.3)
Chloride: 109 mmol/L (ref 98–111)
Creatinine, Ser: 0.43 mg/dL — ABNORMAL LOW (ref 0.44–1.00)
GFR, Estimated: >60 mL/min
Glucose, Bld: 96 mg/dL (ref 70–99)
Potassium: 3.2 mmol/L — ABNORMAL LOW (ref 3.5–5.1)
Sodium: 142 mmol/L (ref 135–145)

## 2024-10-23 LAB — CBC
HCT: 26.2 % — ABNORMAL LOW (ref 36.0–46.0)
Hemoglobin: 8.6 g/dL — ABNORMAL LOW (ref 12.0–15.0)
MCH: 27.8 pg (ref 26.0–34.0)
MCHC: 32.8 g/dL (ref 30.0–36.0)
MCV: 84.8 fL (ref 80.0–100.0)
Platelets: 19 K/uL — CL (ref 150–400)
RBC: 3.09 MIL/uL — ABNORMAL LOW (ref 3.87–5.11)
RDW: 16.5 % — ABNORMAL HIGH (ref 11.5–15.5)
WBC: 13.8 K/uL — ABNORMAL HIGH (ref 4.0–10.5)
nRBC: 15.4 % — ABNORMAL HIGH (ref 0.0–0.2)

## 2024-10-23 MED ORDER — ALBUTEROL SULFATE (2.5 MG/3ML) 0.083% IN NEBU
2.5000 mg | INHALATION_SOLUTION | Freq: Two times a day (BID) | RESPIRATORY_TRACT | Status: DC
  Administered 2024-10-23 – 2024-10-25 (×5): 2.5 mg via RESPIRATORY_TRACT
  Filled 2024-10-23 (×6): qty 3

## 2024-10-23 MED ORDER — ALBUTEROL SULFATE (2.5 MG/3ML) 0.083% IN NEBU
2.5000 mg | INHALATION_SOLUTION | RESPIRATORY_TRACT | Status: DC | PRN
  Administered 2024-10-24 – 2024-10-28 (×9): 2.5 mg via RESPIRATORY_TRACT
  Filled 2024-10-23 (×8): qty 3

## 2024-10-23 MED ORDER — SODIUM CHLORIDE 0.9 % IV SOLN: INTRAVENOUS | Status: DC

## 2024-10-23 MED ORDER — POTASSIUM CHLORIDE CRYS ER 20 MEQ PO TBCR
40.0000 meq | EXTENDED_RELEASE_TABLET | Freq: Two times a day (BID) | ORAL | Status: AC
  Administered 2024-10-23 (×2): 40 meq via ORAL
  Filled 2024-10-23 (×2): qty 2

## 2024-10-23 NOTE — Progress Notes (Addendum)
 CCMD reported pt had 7 bts of SVT. Pt denies chest pain. On call A. Chavez,NP made aware and no new order received.    10/23/24 0131  Vitals  Temp 98.2 F (36.8 C)  Temp Source Oral  BP 138/81  MAP (mmHg) 96  BP Location Right Wrist  BP Method Automatic  Patient Position (if appropriate) Lying  Pulse Rate 95  Pulse Rate Source Monitor  Resp 16  MEWS COLOR  MEWS Score Color Green  Oxygen Therapy  SpO2 97 %  O2 Device Room Air  MEWS Score  MEWS Temp 0  MEWS Systolic 0  MEWS Pulse 0  MEWS RR 0  MEWS LOC 0  MEWS Score 0

## 2024-10-23 NOTE — Assessment & Plan Note (Signed)
-   Diagnosed 02/05/2024.  Bone marrow showed hypercellular bone marrow 60% involved by plasma cell neoplasm. 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 No IMID due to hx of PE and SAH on anticoagulation. -started on Carfilzomib /Cytoxan /Dexamethasone  but has not been able to f/u with compliance and has missed many treatments due to personal choice, fatigue/feeling poorly/depression. -we discussed that worsening anemia and thrombocytopenia and hypercalcemia is concern for myeloma progression. Her lack of compliance with treatment and poor functional status and limited social support and baseline co-morbids has made it difficult to pursue current treatment or a more aggressive course of treatment -will get BM Bx --patient agreeable -PET/CT as outpatient (ordered 10/3 and patient has yet not completed this. Transfuse prn for PRBC< 8 and PLT<10k or if bleeding -continue outpatient Carfilzomib  if able and hold cytoxan  -referral to [facility] Dr Fernande or Edson Ehrlich for consideration of BITE therapy steup and then can continue here. - Dr. Onesimo had discussed goals of care and also gave patient option for consideration of BSC through hospice which the patient currently declined as an option.

## 2024-10-23 NOTE — Assessment & Plan Note (Addendum)
 Continue folic acid.

## 2024-10-23 NOTE — Consult Note (Signed)
 "  Chief Complaint: Hx of multiple myeloma with bone metastasis. Persistent pancytopenia and hypercalcemia - IR consulted for bone marrow biopsy and aspiration  Referring Provider(s): Onesimo Emaline Brink, MD   Supervising Physician: Vanice Revel  Patient Status: [facility] - In-pt  History of Present Illness: Carla Hanson is a 58 y.o. female with pmhx of multiple myeloma with bone metastasis. First diagnosed 02/2024 and has since been on chemotherapy with oncology,. She was admitted to the hospital on 10/20/24 after presenting with general weakness. Patient was found to have acute on chronic pancytopenia, with platelets below 20k, as well as persistent anemia. Oncology has consulted on the patient and now requesting IR bone marrow biopsy and aspiration for further diagnostic evaluation.  Today patient feeling tired, otherwise with no new additional complaint.    Patient is Full Code  Past Medical History:  Diagnosis Date   Alcohol  abuse    Anxiety    Back pain    Bipolar disorder (HCC)    Bronchitis    COPD exacerbation (HCC) 05/10/2016   Drug use    Edema 02/24/2024   History of blood clots    Hyperlipidemia    Hypertension    Influenza A 12/01/2018   Joint pain    Left leg DVT (HCC) 09/29/2013   Lipoma    Abdomen   LIPOMA 01/20/2008   Obesity    Occasional tremors 05/12/2019   Osteoarthritis of right knee    Other fatigue    Painful menstrual periods 01/05/2012   Pneumonia    Prediabetes    Seizure (HCC)    Shortness of breath on exertion    Stroke (HCC)    Tobacco abuse     Past Surgical History:  Procedure Laterality Date   CESAREAN SECTION     CYSTECTOMY     LIPOMA EXCISION  03/2011   ORIF ANKLE FRACTURE  03/13/2012   Procedure: OPEN REDUCTION INTERNAL FIXATION (ORIF) ANKLE FRACTURE;  Surgeon: Lynwood FORBES Better, MD;  Location: [facility];  Service: Orthopedics;  Laterality: Right;   TUBAL LIGATION      Allergies: Latuda  [lurasidone ]  Medications: Prior to  Admission medications  Medication Sig Start Date End Date Taking? Authorizing Provider  acyclovir  (ZOVIRAX ) 400 MG tablet Take 1 tablet (400 mg total) by mouth 2 (two) times daily. 08/06/24  Yes Onesimo Emaline Brink, MD  albuterol  (PROVENTIL ) (2.5 MG/3ML) 0.083% nebulizer solution Take 3 mLs (2.5 mg total) by nebulization every 6 (six) hours as needed for wheezing or shortness of breath. 02/06/24  Yes Anders Otto DASEN, MD  albuterol  (VENTOLIN  HFA) 108 (90 Base) MCG/ACT inhaler Inhale 2 puffs into the lungs every 6 (six) hours as needed for wheezing or shortness of breath. 10/18/24  Yes Anders Otto DASEN, MD  aspirin  EC 81 MG EC tablet Take 1 tablet (81 mg total) by mouth daily. 11/14/16  Yes Noemi Reena LITTIE, NP  cetirizine  (ZYRTEC ) 10 MG tablet Take 1 tablet (10 mg total) by mouth daily. Patient taking differently: Take 10 mg by mouth daily as needed for rhinitis or allergies. 08/06/24  Yes Anders Otto DASEN, MD  ferrous sulfate  325 (65 FE) MG tablet Take 325 mg by mouth daily with breakfast.   Yes [provider]  folic acid  (FOLVITE ) 1 MG tablet Take 1 tablet (1 mg total) by mouth 2 (two) times daily. 09/15/24  Yes Onesimo Emaline Brink, MD  losartan  (COZAAR ) 25 MG tablet Take 1 tablet (25 mg total) by mouth daily. 09/21/24  Yes Verdon,  Caren D, MD  potassium chloride  SA (KLOR-CON  M) 20 MEQ tablet Take 1 tablet (20 mEq total) by mouth 2 (two) times daily. Patient taking differently: Take 40 mEq by mouth 2 (two) times daily. 10/18/24 11/21/24 Yes Onesimo Emaline Brink, MD  prochlorperazine  (COMPAZINE ) 10 MG tablet Take 1 tablet (10 mg total) by mouth every 6 (six) hours as needed for nausea or vomiting 07/06/24  Yes Kale, Emaline Brink, MD  SYSTANE ULTRA 0.4-0.3 % SOLN Place 1 drop into both eyes 3 (three) times daily as needed (for dryness).   Yes [provider]  thiamine  (VITAMIN B1) 100 MG tablet Take 1 tablet (100 mg total) by mouth daily. 09/15/24  Yes Thayil, Irene T, PA-C   tiZANidine  (ZANAFLEX ) 4 MG tablet Take 1 tablet (4 mg total) by mouth 2 (two) times daily as needed for muscle spasms. 08/06/24  Yes Anders Otto DASEN, MD  TYLENOL  500 MG tablet Take 500-1,000 mg by mouth every 6 (six) hours as needed for mild pain (pain score 1-3) (or headaches).   Yes [provider]  acetaminophen  (TYLENOL ) 325 MG tablet Take 2 tablets (650 mg total) by mouth every 6 (six) hours  if needed for mild pain (1-3) for up to 5 days. Patient not taking: Reported on 10/20/2024 08/18/24     calcitonin, salmon, (MIACALCIN /FORTICAL) 200 UNIT/ACT nasal spray Place 1 spray into alternate nostrils daily. Patient not taking: Reported on 10/20/2024 09/15/24   Thayil, Irene T, PA-C  carvedilol  (COREG ) 3.125 MG tablet Take 1 tablet (3.125 mg total) by mouth in the morning and evening with meals. Patient not taking: Reported on 09/21/2024 08/18/24     LORazepam  (ATIVAN ) 0.5 MG tablet Take 1 tablet (0.5 mg total) by mouth once as needed for up to 1 dose for anxiety (for PET/CT or MRI). Patient not taking: Reported on 10/20/2024 10/04/24   Onesimo Emaline Brink, MD  pantoprazole  (PROTONIX ) 40 MG tablet Take 1 tablet (40 mg total) by mouth daily at 6 (six) AM. Patient not taking: Reported on 10/20/2024 09/13/24   Sebastian Toribio GAILS, MD  rosuvastatin  (CRESTOR ) 20 MG tablet Take 1 tablet (20 mg total) by mouth daily. 05/20/24 10/06/24  Anders Otto DASEN, MD     Family History  Problem Relation Age of Onset   Other Mother        High blood pressure runs in the family   Obesity Mother    Asthma Father    Heart attack Father    Obesity Father    Diabetes Maternal Aunt    Breast cancer Neg Hx     Social History   Socioeconomic History   Marital status: Divorced    Spouse name: Not on file   Number of children: 4   Years of education: 10   Highest education level: Not on file  Occupational History   Occupation: stay at home  Tobacco Use   Smoking status: Former    Current  packs/day: 0.00    Average packs/day: 0.5 packs/day for 35.0 years (17.5 ttl pk-yrs)    Types: Cigarettes    Start date: 11/04/1981    Quit date: 10/2016    Years since quitting: 8.0   Smokeless tobacco: Never  Vaping Use   Vaping status: Never Used  Substance and Sexual Activity   Alcohol  use: No    Alcohol /week: 0.0 standard drinks of alcohol     Comment: Quit 10/2016   Drug use: Yes    Types: Marijuana   Sexual activity: Not Currently  Comment: tubal  Other Topics Concern   Not on file  Social History Narrative   Unemployed, 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  Considering nursing.    Lives at home alone   Right-handed   Caffeine: not much   Social Drivers of Health   Tobacco Use: Medium Risk (10/20/2024)   Patient History    Smoking Tobacco Use: Former    Smokeless Tobacco Use: Never    Passive Exposure: Not on file  Financial Resource Strain: Low Risk (05/14/2024)   Overall Financial Resource Strain (CARDIA)    Difficulty of Paying Living Expenses: Not hard at all  Food Insecurity: No Food Insecurity (10/20/2024)   Epic    Worried About Programme Researcher, Broadcasting/film/video in the Last Year: Never true    Ran Out of Food in the Last Year: Never true  Transportation Needs: No Transportation Needs (10/20/2024)   Epic    Lack of Transportation (Medical): No    Lack of Transportation (Non-Medical): No  Physical Activity: Insufficiently Active (05/14/2024)   Exercise Vital Sign    Days of Exercise per Week: 3 days    Minutes of Exercise per Session: 20 min  Stress: No Stress Concern Present (05/14/2024)   [employer] of Occupational Health - Occupational Stress Questionnaire    Feeling of Stress: Not at all  Social Connections: Moderately Integrated (10/20/2024)   Social Connection and Isolation Panel    Frequency of Communication with Friends and Family: More than three times a week    Frequency  of Social Gatherings with Friends and Family: More than three times a week    Attends Religious Services: More than 4 times per year    Active Member of Clubs or Organizations: Yes    Attends Banker Meetings: More than 4 times per year    Marital Status: Divorced  Depression (PHQ2-9): Low Risk (09/08/2024)   Depression (PHQ2-9)    PHQ-2 Score: 1  Alcohol  Screen: Not on file  Housing: Low Risk (10/20/2024)   Epic    Unable to Pay for Housing in the Last Year: No    Number of Times Moved in the Last Year: 0    Homeless in the Last Year: No  Utilities: Not At Risk (10/20/2024)   Epic    Threatened with loss of utilities: No  Health Literacy: Not on file     Review of Systems: A 12 point ROS discussed and pertinent positives are indicated in the HPI above.  All other systems are negative.  Vital Signs: BP (!) 170/79 (BP Location: Right Leg)   Pulse 92   Temp 98.1 F (36.7 C) (Oral)   Resp 16   Ht 5' 1.5 (1.562 m)   Wt 204 lb 2.3 oz (92.6 kg)   LMP  (LMP Unknown)   SpO2 97%   BMI 37.95 kg/m   Advance Care Plan: No documents on file  Physical Exam Vitals and nursing note reviewed.  Constitutional:      Appearance: Normal appearance.  HENT:     Mouth/Throat:     Mouth: Mucous membranes are moist.     Pharynx: Oropharynx is clear.  Cardiovascular:     Rate and Rhythm: Normal rate.  Pulmonary:     Effort: Pulmonary effort is normal.     Breath sounds: Normal breath sounds.  Abdominal:     Palpations: Abdomen is soft.     Tenderness:  There is no abdominal tenderness.  Musculoskeletal:     Right lower leg: No edema.     Left lower leg: No edema.  Skin:    General: Skin is warm and dry.  Neurological:     Mental Status: She is alert and oriented to person, place, and time. Mental status is at baseline.     Imaging: No results found.  Labs:  CBC: Recent Labs    10/20/24 0817 10/21/24 0504 10/21/24 2246 10/22/24 1038 10/23/24 0521  WBC  17.1* 12.3*  --  15.3* 13.8*  HGB 5.9* 6.1* 8.5* 9.2* 8.6*  HCT 18.1* 18.9* 25.0* 27.5* 26.2*  PLT 31* 20*  --  22* 19*    COAGS: Recent Labs    09/08/24 1553  INR 1.1    BMP: Recent Labs    10/20/24 0817 10/21/24 0504 10/22/24 1038 10/23/24 0521  NA 140 139 140 142  K 3.5 3.1* 3.5 3.2*  CL 103 106 107 109  CO2 22 25 22 22   GLUCOSE 135* 104* 89 96  BUN 9 9 7 7   CALCIUM  12.8* 12.1* 12.5* 11.8*  CREATININE 0.59 0.48 0.45 0.43*  GFRNONAA >60 >60 >60 >60    LIVER FUNCTION TESTS: Recent Labs    09/15/24 0930 09/22/24 1228 09/27/24 1412 10/20/24 0817  BILITOT 0.4 0.5 0.5 0.9  AST 19 96* 28 52*  ALT 12 11 8 11   ALKPHOS 155* 182* 154* 172*  PROT 7.8 7.7 7.8 8.2*  ALBUMIN 3.3* 3.6 3.5 3.3*    TUMOR MARKERS: No results for input(s): AFPTM, CEA, CA199, CHROMGRNA in the last 8760 hours.  Assessment and Plan:  Carla Hanson is a 58 y.o. female with pmhx of multiple myeloma with bone metastasis. First diagnosed 02/2024 and has since been on chemotherapy with oncology,. She was admitted to the hospital on 10/20/24 after presenting with general weakness. Patient was found to have acute on chronic pancytopenia, with platelets below 20k, as well as persistent anemia. Oncology has consulted on the patient and now requesting IR bone marrow biopsy and aspiration for further diagnostic evaluation.  Today patient feeling tired, otherwise with no new additional complaint.   IR with tentatively look to perform bone marrow biopsy early next week, whenever schedule allows. Will make patient NPO at midnight Monday 12/22 in case it is possible to proceed this day.  Risks and benefits of bone marrow biopsy and aspiration was discussed with the patient and/or patient's family including, but not limited to bleeding, infection, damage to adjacent structures or low yield requiring additional tests.  All of the questions were answered and there is agreement to proceed.  Consent  signed and in chart.   Thank you for allowing our service to participate in Carla Hanson 's care.  Electronically Signed: Kimble VEAR Clas, PA-C   10/23/2024, 12:04 PM      I spent a total of 20 Minutes    in face to face in clinical consultation, greater than 50% of which was counseling/coordinating care for bone marrow biopsy and aspiration.   "

## 2024-10-23 NOTE — Progress Notes (Signed)
 Carla Hanson   DOB:02-Feb-1966   FM#:[id number]    ASSESSMENT & PLAN:  Carla Hanson is a 58 year old female patient admitted on 10/20/2024 with complaints of weakness. Oncologic history is significant for multiple myeloma with bone mets.   Discussed with patient, platelet count still low.  Anemia persists.  Previously folate deficiency.  She says she is taking.  In that case, certainly concerning about myeloma as the cause.  She had discussed with bone marrow biopsy with Dr. Onesimo and agrees to proceed.  Assessment & Plan Multiple myeloma not having achieved remission (HCC) - Diagnosed 02/05/2024.  Bone marrow showed hypercellular bone marrow 60% involved by plasma cell neoplasm. 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 No IMID due to hx of PE and SAH on anticoagulation. -started on Carfilzomib /Cytoxan /Dexamethasone  but has not been able to f/u with compliance and has missed many treatments due to personal choice, fatigue/feeling poorly/depression. -we discussed that worsening anemia and thrombocytopenia and hypercalcemia is concern for myeloma progression. Her lack of compliance with treatment and poor functional status and limited social support and baseline co-morbids has made it difficult to pursue current treatment or a more aggressive course of treatment -will get BM Bx --patient agreeable -PET/CT as outpatient (ordered 10/3 and patient has yet not completed this. Transfuse prn for PRBC< 8 and PLT<10k or if bleeding -continue outpatient Carfilzomib  if able and hold cytoxan  -referral to [facility] Fernande or Edson Ehrlich for consideration of BITE therapy steup and then can continue here. - Dr. Onesimo had discussed goals of care and also gave patient option for consideration of BSC through hospice which the patient currently declined as an option.  Hypercalcemia of malignancy -was previously on calcitonin and Zometa , per patient these have  been discontinued.  She has some mild worsening of her hypercalcemia. -Hydrate with normal saline -Monitor calcium  level with daily labs Folate deficiency Continue folic acid  History of pulmonary embolism Was on chronic aspirin  as outpatient holding due to severe thrombocytopenia. Reported history of SAH on anticoagulation Iron deficiency anemia On ferrous sulfate  daily Symptomatic anemia Transfuse prn for PRBC< 8 and PLT<10k or if bleeding   Discharge planning Monitor daily CBC for improvement.  Marrow biopsy have been ordered on 12/19.  This can be followed as an outpatient if CBC improved over the next few days.  All questions were answered.     Thank you for the consult.   Carla JAYSON Chihuahua, MD 10/23/2024 10:47 AM  Subjective:  Carla Hanson reports feeling a little slow this morning.  She denies any new bone pain or back pain.  Report of tired.  There is no bleeding.  No chest pain no abdominal pain.  Objective:  Vitals:   10/23/24 0442 10/23/24 1031  BP: (!) 170/79   Pulse: 92   Resp: 16   Temp: 98.1 F (36.7 C)   SpO2: 98% 97%     Intake/Output Summary (Last 24 hours) at 10/23/2024 1047 Last data filed at 10/23/2024 1000 Gross per 24 hour  Intake 2375.49 ml  Output 400 ml  Net 1975.49 ml    GENERAL: alert, no distress and comfortable EYES:  sclera clear LUNGS: No wheeze, rales and clear to auscultation bilaterally with normal breathing effort.  HEART: regular rate & rhythm  ABDOMEN: abdomen soft, non-tender and non-distended Musculoskeletal: no lower extremity edema NEURO: alert with fluent speech   Labs:  Recent Labs    09/22/24 1228 09/27/24 1412 10/20/24 9182  10/21/24 0504 10/22/24 1038 10/23/24 0521  NA 138 139 140 139 140 142  K 3.9 3.9 3.5 3.1* 3.5 3.2*  CL 99 105 103 106 107 109  CO2 25 23 22 25 22 22   GLUCOSE 129* 132* 135* 104* 89 96  BUN 10 9 9 9 7 7   CREATININE 0.62 0.56 0.59 0.48 0.45 0.43*  CALCIUM  12.3* 10.4* 12.8* 12.1* 12.5* 11.8*   GFRNONAA >60 >60 >60 >60 >60 >60  PROT 7.7 7.8 8.2*  --   --   --   ALBUMIN 3.6 3.5 3.3*  --   --   --   AST 96* 28 52*  --   --   --   ALT 11 8 11   --   --   --   ALKPHOS 182* 154* 172*  --   --   --   BILITOT 0.5 0.5 0.9  --   --   --     Studies:  No results found.

## 2024-10-23 NOTE — Assessment & Plan Note (Signed)
 Was on chronic aspirin  as outpatient holding due to severe thrombocytopenia. Reported history of SAH on anticoagulation

## 2024-10-23 NOTE — Assessment & Plan Note (Signed)
-  was previously on calcitonin and Zometa , per patient these have been discontinued.  She has some mild worsening of her hypercalcemia. -Hydrate with normal saline -Monitor calcium  level with daily labs

## 2024-10-23 NOTE — Assessment & Plan Note (Signed)
 Transfuse prn for PRBC< 8 and PLT<10k or if bleeding

## 2024-10-23 NOTE — Progress Notes (Addendum)
 [facility] PROGRESS NOTE    Progress Note  Carla Hanson  FMW:[id number] DOB: 09/04/1966 DOA: 10/20/2024 PCP: Anders Otto DASEN, MD     Brief Narrative:   Carla Hanson is an 58 y.o. 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  Was found to have a big bruise on her right upper extremity  Significant Events: 10/20/2024 2 units packed red blood cells  Assessment/Plan:   Symptomatic anemia/anemia due to antineoplastic agents: Hemoglobin is 5.9, she denies any signs of overt bleeding. Status post 3 units of packed red blood cells hemoglobin this morning is 8.6. FOBT is negative.    Multiple myeloma on chemotherapy: Oncology following.  She was started on chemotherapy as an outpatient, but has not been able to follow-up, with compliance and has missed treatment due to personal choice, feeling fatigue and tired. She has worsening anemia, thrombocytopenia and hypercalcemia concerning for myeloma progression. Her lack of compliance with treatment and poor functional status along with limited social support and comorbidities made it difficult to pursue more aggressive treatment. Oncology has schedule bone marrow probably for 10/25/2024. Oncology is scheduled a PET scan as an outpatient. Transfuse if hemoglobin less than 8 or platelet count less than 10K.  Leukocytosis: No evidence of infection. Has remained afebrile.  Leukocytosis has remained stable anywhere between 12-15.  Hypercalcemia of malignancy: Her calcium  is improving. Calcium  continues to improve with calcitonin and IV fluids. Continue strict I's and O's and daily weights monitor saturations. Continue IV fluids for an additional 24 hours.  Chronic thrombocytopenia  likely due to on antineoplastic agents: Likely due to progression of her multiple myeloma.  Holding aspirin . No signs of bleeding. Oncology schedule bone marrow biopsy as an inpatient. FOBT is negative.  Hypokalemia: Replete orally check in the morning  Hyperlipidemia:  Continue statins.  Hypertension: Continue Cozaar .  History of pulmonary embolism Continue aspirin  twice a day.   DVT prophylaxis: scd's Family Communication:none Status is: Observation The patient remains OBS appropriate and will d/c before 2 midnights.    Code Status:     Code Status Orders  (From admission, onward)           Start     Ordered   10/20/24 1049  Full code  Continuous       Question:  By:  Answer:  Consent: discussion documented in EHR   10/20/24 1049           Code Status History     Date Active Date Inactive Code Status Order ID Comments User Context   09/08/2024 1708 09/12/2024 1738 Full Code [id number]  Sebastian Toribio GAILS, MD ED   01/30/2024 2047 02/05/2024 2004 Full Code [id number]  Tobie Jorie SAUNDERS, MD Inpatient   11/08/2016 2132 11/13/2016 2053 Full Code [id number]  Michaela Aisha SQUIBB, MD ED   10/31/2016 2025 11/03/2016 1615 Full Code [id number]  Drusilla Sabas RAMAN, MD Inpatient   05/05/2012 0158 05/05/2012 2331 Full Code [id number]  Ritch, [place] BIRCH, MD Inpatient         IV Access:   Peripheral IV   Procedures and diagnostic studies:   No results found.   Medical Consultants:   None.   Subjective:    Carla Hanson feels better no complaints.  Objective:    Vitals:  10/22/24 1453 10/22/24 2030 10/23/24 0131 10/23/24 0442  BP:  (!) 154/76 138/81 (!) 170/79  Pulse:  96 95 92  Resp:  19 16 16   Temp:  98.6 F (37 C) 98.2 F (36.8 C) 98.1 F (36.7 C)  TempSrc:  Oral Oral Oral  SpO2: 96% 97% 97% 98%  Weight:      Height:       SpO2: 98 %   Intake/Output Summary (Last 24 hours) at 10/23/2024 1028 Last data filed at 10/23/2024 1000 Gross per 24 hour   Intake 2375.49 ml  Output 400 ml  Net 1975.49 ml   Filed Weights   10/20/24 1548  Weight: 92.6 kg    Exam: General exam: In no acute distress. Respiratory system: Good air movement and clear to auscultation. Cardiovascular system: S1 & S2 heard, RRR. No JVD. Gastrointestinal system: Abdomen is nondistended, soft and nontender.  Extremities: No pedal edema. Skin: No rashes, lesions or ulcers Psychiatry: Judgement and insight appear normal. Mood & affect appropriate. Data Reviewed:    Labs: Basic Metabolic Panel: Recent Labs  Lab 10/20/24 0817 10/21/24 0504 10/22/24 1038 10/23/24 0521  NA 140 139 140 142  K 3.5 3.1* 3.5 3.2*  CL 103 106 107 109  CO2 22 25 22 22   GLUCOSE 135* 104* 89 96  BUN 9 9 7 7   CREATININE 0.59 0.48 0.45 0.43*  CALCIUM  12.8* 12.1* 12.5* 11.8*   GFR Estimated Creatinine Clearance: 81.3 mL/min (A) (by C-G formula based on SCr of 0.43 mg/dL (L)). Liver Function Tests: Recent Labs  Lab 10/20/24 0817  AST 52*  ALT 11  ALKPHOS 172*  BILITOT 0.9  PROT 8.2*  ALBUMIN 3.3*   No results for input(s): LIPASE, AMYLASE in the last 168 hours. No results for input(s): AMMONIA in the last 168 hours. Coagulation profile No results for input(s): INR, PROTIME in the last 168 hours. COVID-19 Labs  No results for input(s): DDIMER, FERRITIN, LDH, CRP in the last 72 hours.  Lab Results  Component Value Date   SARSCOV2NAA NEGATIVE 09/08/2024    CBC: Recent Labs  Lab 10/20/24 0817 10/21/24 0504 10/21/24 2246 10/22/24 1038 10/23/24 0521  WBC 17.1* 12.3*  --  15.3* 13.8*  NEUTROABS 7.0  --   --  6.7  --   HGB 5.9* 6.1* 8.5* 9.2* 8.6*  HCT 18.1* 18.9* 25.0* 27.5* 26.2*  MCV 83.0 85.9  --  84.4 84.8  PLT 31* 20*  --  22* 19*   Cardiac Enzymes: No results for input(s): CKTOTAL, CKMB, CKMBINDEX, TROPONINI in the last 168 hours. BNP (last 3 results) No results for input(s): PROBNP in the last 8760 hours. CBG: No  results for input(s): GLUCAP in the last 168 hours. D-Dimer: No results for input(s): DDIMER in the last 72 hours. Hgb A1c: No results for input(s): HGBA1C in the last 72 hours. Lipid Profile: No results for input(s): CHOL, HDL, LDLCALC, TRIG, CHOLHDL, LDLDIRECT in the last 72 hours. Thyroid  function studies: No results for input(s): TSH, T4TOTAL, T3FREE, THYROIDAB in the last 72 hours.  Invalid input(s): FREET3 Anemia work up: No results for input(s): VITAMINB12, FOLATE, FERRITIN, TIBC, IRON, RETICCTPCT in the last 72 hours. Sepsis Labs: Recent Labs  Lab 10/20/24 0817 10/21/24 0504 10/22/24 1038 10/23/24 0521  WBC 17.1* 12.3* 15.3* 13.8*   Microbiology No results found for this or any previous visit (from the past 240 hours).   Medications:    sodium chloride    Intravenous Once   albuterol   2.5  mg Nebulization BID   calcitonin (salmon)  1 spray Alternating Nares Daily   ferrous sulfate   325 mg Oral Q breakfast   folic acid   1 mg Oral BID   loratadine   10 mg Oral QHS   losartan   25 mg Oral Daily   pantoprazole   40 mg Oral Q0600   potassium chloride   40 mEq Oral BID   thiamine   100 mg Oral Daily   Continuous Infusions:  sodium chloride  100 mL/hr at 10/23/24 1010      LOS: 0 days   Erle Odell Castor  [facility]  10/23/2024, 10:28 AM

## 2024-10-23 NOTE — Assessment & Plan Note (Signed)
On ferrous sulfate daily.

## 2024-10-23 NOTE — Progress Notes (Signed)
" °   10/23/24 9394  Provider Notification  Provider Name/Title A. Chavez,NP  Date Provider Notified 10/23/24  Time Provider Notified [phone]  Method of Notification Page  Notification Reason Critical Result  Test performed and critical result Platelets  Date Critical Result Received 10/23/24  Time Critical Result Received 0605  Provider response No new orders  Date of Provider Response 10/23/24  Time of Provider Response [phone]    "

## 2024-10-24 DIAGNOSIS — D649 Anemia, unspecified: Secondary | ICD-10-CM | POA: Diagnosis not present

## 2024-10-24 LAB — CBC WITH DIFFERENTIAL/PLATELET
Abs Immature Granulocytes: 1.32 K/uL — ABNORMAL HIGH (ref 0.00–0.07)
Basophils Absolute: 0.2 K/uL — ABNORMAL HIGH (ref 0.0–0.1)
Basophils Relative: 1 %
Eosinophils Absolute: 0.2 K/uL (ref 0.0–0.5)
Eosinophils Relative: 1 %
HCT: 25.2 % — ABNORMAL LOW (ref 36.0–46.0)
Hemoglobin: 8.3 g/dL — ABNORMAL LOW (ref 12.0–15.0)
Immature Granulocytes: 9 %
Lymphocytes Relative: 36 %
Lymphs Abs: 5 K/uL — ABNORMAL HIGH (ref 0.7–4.0)
MCH: 28.2 pg (ref 26.0–34.0)
MCHC: 32.9 g/dL (ref 30.0–36.0)
MCV: 85.7 fL (ref 80.0–100.0)
Monocytes Absolute: 1.7 K/uL — ABNORMAL HIGH (ref 0.1–1.0)
Monocytes Relative: 12 %
Neutro Abs: 5.6 K/uL (ref 1.7–7.7)
Neutrophils Relative %: 41 %
Platelets: 17 K/uL — CL (ref 150–400)
RBC: 2.94 MIL/uL — ABNORMAL LOW (ref 3.87–5.11)
RDW: 17.2 % — ABNORMAL HIGH (ref 11.5–15.5)
Smear Review: NORMAL
WBC: 14 K/uL — ABNORMAL HIGH (ref 4.0–10.5)
nRBC: 13.1 % — ABNORMAL HIGH (ref 0.0–0.2)

## 2024-10-24 LAB — RENAL FUNCTION PANEL
Albumin: 2.7 g/dL — ABNORMAL LOW (ref 3.5–5.0)
Anion gap: 9 (ref 5–15)
BUN: 6 mg/dL (ref 6–20)
CO2: 22 mmol/L (ref 22–32)
Calcium: 11 mg/dL — ABNORMAL HIGH (ref 8.9–10.3)
Chloride: 110 mmol/L (ref 98–111)
Creatinine, Ser: 0.43 mg/dL — ABNORMAL LOW (ref 0.44–1.00)
GFR, Estimated: >60 mL/min
Glucose, Bld: 97 mg/dL (ref 70–99)
Phosphorus: 2 mg/dL — ABNORMAL LOW (ref 2.5–4.6)
Potassium: 3.4 mmol/L — ABNORMAL LOW (ref 3.5–5.1)
Sodium: 141 mmol/L (ref 135–145)

## 2024-10-24 MED ORDER — POTASSIUM CHLORIDE CRYS ER 20 MEQ PO TBCR
40.0000 meq | EXTENDED_RELEASE_TABLET | Freq: Once | ORAL | Status: AC
  Administered 2024-10-24: 40 meq via ORAL
  Filled 2024-10-24: qty 2

## 2024-10-24 NOTE — Plan of Care (Signed)
   Problem: Education: Goal: Knowledge of General Education information will improve Description Including pain rating scale, medication(s)/side effects and non-pharmacologic comfort measures Outcome: Progressing   Problem: Health Behavior/Discharge Planning: Goal: Ability to manage health-related needs will improve Outcome: Progressing

## 2024-10-24 NOTE — Plan of Care (Signed)
" °  Problem: Education: Goal: Knowledge of General Education information will improve Description: Including pain rating scale, medication(s)/side effects and non-pharmacologic comfort measures Outcome: Progressing   Problem: Clinical Measurements: Goal: Ability to maintain clinical measurements within normal limits will improve Outcome: Progressing Goal: Will remain free from infection Outcome: Progressing Goal: Diagnostic test results will improve Outcome: Progressing Goal: Respiratory complications will improve Outcome: Progressing Goal: Cardiovascular complication will be avoided Outcome: Progressing   Problem: Activity: Goal: Risk for activity intolerance will decrease Outcome: Progressing   Problem: Nutrition: Goal: Adequate nutrition will be maintained Outcome: Progressing   Problem: Coping: Goal: Level of anxiety will decrease Outcome: Progressing   Problem: Elimination: Goal: Will not experience complications related to bowel motility Outcome: Progressing Goal: Will not experience complications related to urinary retention Outcome: Progressing   Problem: Pain Managment: Goal: General experience of comfort will improve and/or be controlled Outcome: Progressing   Problem: Safety: Goal: Ability to remain free from injury will improve Outcome: Progressing   Problem: Skin Integrity: Goal: Risk for impaired skin integrity will decrease Outcome: Progressing   Problem: Education: Goal: Knowledge of General Education information will improve Description: Including pain rating scale, medication(s)/side effects and non-pharmacologic comfort measures Outcome: Progressing   Problem: Health Behavior/Discharge Planning: Goal: Ability to manage health-related needs will improve Outcome: Progressing   Problem: Clinical Measurements: Goal: Ability to maintain clinical measurements within normal limits will improve Outcome: Progressing Goal: Will remain free from  infection Outcome: Progressing Goal: Diagnostic test results will improve Outcome: Progressing Goal: Respiratory complications will improve Outcome: Progressing Goal: Cardiovascular complication will be avoided Outcome: Progressing   Problem: Activity: Goal: Risk for activity intolerance will decrease Outcome: Progressing   Problem: Nutrition: Goal: Adequate nutrition will be maintained Outcome: Progressing   Problem: Coping: Goal: Level of anxiety will decrease Outcome: Progressing   Problem: Elimination: Goal: Will not experience complications related to bowel motility Outcome: Progressing Goal: Will not experience complications related to urinary retention Outcome: Progressing   Problem: Pain Managment: Goal: General experience of comfort will improve and/or be controlled Outcome: Progressing   Problem: Safety: Goal: Ability to remain free from injury will improve Outcome: Progressing   Problem: Skin Integrity: Goal: Risk for impaired skin integrity will decrease Outcome: Progressing   "

## 2024-10-24 NOTE — Progress Notes (Signed)
 " PROGRESS NOTE    Carla Hanson  FMW:[id number] DOB: [birth year] DOA: 10/20/2024 PCP: Anders Otto DASEN, MD     Brief Narrative:  Carla Hanson is an 58 y.o. 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  Was found to have a big bruise on her right upper extremity.  She was transfused 2 unit packed red blood cell 12/17, another 2 unit 12/18.  New events last 24 hours / Subjective: Still with some weakness, eager to work with PT.  Feels a little stronger since being admitted.  Bone marrow biopsy planned for tomorrow.  Assessment & Plan:    Principal Problem:   Symptomatic anemia Active Problems:   Essential hypertension   History of pulmonary embolism   Iron deficiency anemia   Anemia   Hypercalcemia of malignancy   Multiple myeloma not having achieved remission (HCC)   Folate deficiency   Anemia due to antineoplastic agent   Counseling regarding advance care planning and goals of care   Thrombocytopenia   Symptomatic anemia, thrombocytopenia Multiple myeloma - Transfused 4 unit packed red blood cell since admission - Oncology following - Bone marrow biopsy planned for 12/22 - Transfuse for hemoglobin <8, platelet <10  Hypercalcemia of malignancy - Calcitonin, IV fluid  Hypokalemia - Replete  Hypertension - Cozaar   History of PE - Not on anticoagulant due to history of intracranial hemorrhage.  She is on aspirin  as outpatient, currently holding due to thrombocytopenia   DVT prophylaxis:  SCDs Start: 10/20/24 1049  Code Status: Full Family Communication: None at bedside Disposition Plan:  Home  Status is: Observation The patient will require care spanning > 2 midnights and should be moved to inpatient because: Bone  marrow biopsy tomorrow     Antimicrobials:  Anti-infectives (From admission, onward)    None        Objective: Vitals:   10/23/24 2006 10/23/24 2018 10/24/24 0519 10/24/24 0736  BP: (!) 164/77  (!) 163/86   Pulse: (!) 105  96   Resp: 18  16   Temp: 98.2 F (36.8 C)  98.2 F (36.8 C)   TempSrc: Oral  Oral   SpO2: 98% 96% 98% 97%  Weight:      Height:        Intake/Output Summary (Last 24 hours) at 10/24/2024 1221 Last data filed at 10/24/2024 1000 Gross per 24 hour  Intake 2794.64 ml  Output 1000 ml  Net 1794.64 ml   Filed Weights   10/20/24 1548  Weight: 92.6 kg    Examination:  General exam: Appears calm and comfortable  Respiratory system: Clear to auscultation. Respiratory effort normal. No respiratory distress. No conversational dyspnea.  Cardiovascular system: S1 & S2 heard, RRR Gastrointestinal system: Abdomen is nondistended, soft  Central nervous system: Alert and oriented. No focal neurological deficits. Speech clear.  Extremities: Symmetric in appearance  Skin: No rashes, lesions or ulcers on exposed skin  Psychiatry: Judgement and insight appear normal. Mood & affect appropriate.   Data Reviewed: I have personally reviewed following labs and imaging studies  CBC: Recent Labs  Lab 10/20/24 0817 10/21/24 0504 10/21/24 2246 10/22/24 1038 10/23/24 0521 10/24/24 0821  WBC 17.1* 12.3*  --  15.3* 13.8* 14.0*  NEUTROABS 7.0  --   --  6.7  --  5.6  HGB 5.9* 6.1* 8.5* 9.2* 8.6* 8.3*  HCT 18.1* 18.9* 25.0* 27.5* 26.2* 25.2*  MCV 83.0 85.9  --  84.4 84.8 85.7  PLT 31* 20*  --  22* 19* 17*   Basic Metabolic Panel: Recent Labs  Lab 10/20/24 0817 10/21/24 0504 10/22/24 1038 10/23/24 0521 10/24/24 0507  NA 140 139 140 142 141  K 3.5 3.1* 3.5 3.2* 3.4*  CL 103 106 107 109 110  CO2 22 25 22 22 22   GLUCOSE 135* 104* 89 96 97  BUN 9 9 7 7 6   CREATININE 0.59 0.48 0.45 0.43* 0.43*  CALCIUM  12.8* 12.1* 12.5* 11.8* 11.0*  PHOS  --   --   --   --   2.0*   GFR: Estimated Creatinine Clearance: 81.3 mL/min (A) (by C-G formula based on SCr of 0.43 mg/dL (L)). Liver Function Tests: Recent Labs  Lab 10/20/24 0817 10/24/24 0507  AST 52*  --   ALT 11  --   ALKPHOS 172*  --   BILITOT 0.9  --   PROT 8.2*  --   ALBUMIN 3.3* 2.7*   No results for input(s): LIPASE, AMYLASE in the last 168 hours. No results for input(s): AMMONIA in the last 168 hours. Coagulation Profile: No results for input(s): INR, PROTIME in the last 168 hours. Cardiac Enzymes: No results for input(s): CKTOTAL, CKMB, CKMBINDEX, TROPONINI in the last 168 hours. BNP (last 3 results) No results for input(s): PROBNP in the last 8760 hours. HbA1C: No results for input(s): HGBA1C in the last 72 hours. CBG: No results for input(s): GLUCAP in the last 168 hours. Lipid Profile: No results for input(s): CHOL, HDL, LDLCALC, TRIG, CHOLHDL, LDLDIRECT in the last 72 hours. Thyroid  Function Tests: No results for input(s): TSH, T4TOTAL, FREET4, T3FREE, THYROIDAB in the last 72 hours. Anemia Panel: No results for input(s): VITAMINB12, FOLATE, FERRITIN, TIBC, IRON, RETICCTPCT in the last 72 hours. Sepsis Labs: No results for input(s): PROCALCITON, LATICACIDVEN in the last 168 hours.  No results found for this or any previous visit (from the past 240 hours).    Radiology Studies: No results found.    Scheduled Meds:  albuterol   2.5 mg Nebulization BID   calcitonin (salmon)  1 spray Alternating Nares Daily   ferrous sulfate   325 mg Oral Q breakfast   folic acid   1 mg Oral BID   loratadine   10 mg Oral QHS   losartan   25 mg Oral Daily   pantoprazole   40 mg Oral Q0600   thiamine   100 mg Oral Daily   Continuous Infusions:   LOS: 0 days   Time spent: 35 minutes   Delon Hoe, DO [facility] 10/24/2024, 12:21 PM   Available via Epic secure chat 7am-7pm After these hours, please refer to  coverage provider listed on [email or web address]  "

## 2024-10-25 DIAGNOSIS — C9000 Multiple myeloma not having achieved remission: Secondary | ICD-10-CM | POA: Diagnosis not present

## 2024-10-25 DIAGNOSIS — D696 Thrombocytopenia, unspecified: Secondary | ICD-10-CM | POA: Diagnosis not present

## 2024-10-25 DIAGNOSIS — D649 Anemia, unspecified: Secondary | ICD-10-CM | POA: Diagnosis not present

## 2024-10-25 LAB — CBC WITH DIFFERENTIAL/PLATELET
Abs Immature Granulocytes: 1.14 K/uL — ABNORMAL HIGH (ref 0.00–0.07)
Basophils Absolute: 0.3 K/uL — ABNORMAL HIGH (ref 0.0–0.1)
Basophils Relative: 2 %
Eosinophils Absolute: 0.3 K/uL (ref 0.0–0.5)
Eosinophils Relative: 2 %
HCT: 24.9 % — ABNORMAL LOW (ref 36.0–46.0)
Hemoglobin: 8.1 g/dL — ABNORMAL LOW (ref 12.0–15.0)
Immature Granulocytes: 9 %
Lymphocytes Relative: 33 %
Lymphs Abs: 4.2 K/uL — ABNORMAL HIGH (ref 0.7–4.0)
MCH: 27.8 pg (ref 26.0–34.0)
MCHC: 32.5 g/dL (ref 30.0–36.0)
MCV: 85.6 fL (ref 80.0–100.0)
Monocytes Absolute: 2 K/uL — ABNORMAL HIGH (ref 0.1–1.0)
Monocytes Relative: 16 %
Neutro Abs: 4.8 K/uL (ref 1.7–7.7)
Neutrophils Relative %: 38 %
Platelets: 16 K/uL — CL (ref 150–400)
RBC: 2.91 MIL/uL — ABNORMAL LOW (ref 3.87–5.11)
RDW: 17.2 % — ABNORMAL HIGH (ref 11.5–15.5)
WBC: 12.7 K/uL — ABNORMAL HIGH (ref 4.0–10.5)
nRBC: 14.7 % — ABNORMAL HIGH (ref 0.0–0.2)

## 2024-10-25 LAB — BASIC METABOLIC PANEL WITH GFR
Anion gap: 8 (ref 5–15)
BUN: 6 mg/dL (ref 6–20)
CO2: 22 mmol/L (ref 22–32)
Calcium: 10.9 mg/dL — ABNORMAL HIGH (ref 8.9–10.3)
Chloride: 109 mmol/L (ref 98–111)
Creatinine, Ser: 0.4 mg/dL — ABNORMAL LOW (ref 0.44–1.00)
GFR, Estimated: >60 mL/min
Glucose, Bld: 111 mg/dL — ABNORMAL HIGH (ref 70–99)
Potassium: 3.5 mmol/L (ref 3.5–5.1)
Sodium: 140 mmol/L (ref 135–145)

## 2024-10-25 LAB — CBC
HCT: 25.4 % — ABNORMAL LOW (ref 36.0–46.0)
Hemoglobin: 8.3 g/dL — ABNORMAL LOW (ref 12.0–15.0)
MCH: 27.9 pg (ref 26.0–34.0)
MCHC: 32.7 g/dL (ref 30.0–36.0)
MCV: 85.5 fL (ref 80.0–100.0)
Platelets: 17 K/uL — CL (ref 150–400)
RBC: 2.97 MIL/uL — ABNORMAL LOW (ref 3.87–5.11)
RDW: 17.2 % — ABNORMAL HIGH (ref 11.5–15.5)
WBC: 12.6 K/uL — ABNORMAL HIGH (ref 4.0–10.5)
nRBC: 15.4 % — ABNORMAL HIGH (ref 0.0–0.2)

## 2024-10-25 LAB — RENAL FUNCTION PANEL
Albumin: 2.9 g/dL — ABNORMAL LOW (ref 3.5–5.0)
Anion gap: 10 (ref 5–15)
BUN: 5 mg/dL — ABNORMAL LOW (ref 6–20)
CO2: 20 mmol/L — ABNORMAL LOW (ref 22–32)
Calcium: 10.9 mg/dL — ABNORMAL HIGH (ref 8.9–10.3)
Chloride: 110 mmol/L (ref 98–111)
Creatinine, Ser: 0.42 mg/dL — ABNORMAL LOW (ref 0.44–1.00)
GFR, Estimated: >60 mL/min
Glucose, Bld: 97 mg/dL (ref 70–99)
Phosphorus: 2.3 mg/dL — ABNORMAL LOW (ref 2.5–4.6)
Potassium: 3.2 mmol/L — ABNORMAL LOW (ref 3.5–5.1)
Sodium: 140 mmol/L (ref 135–145)

## 2024-10-25 LAB — VITAMIN B12: Vitamin B-12: 1022 pg/mL — ABNORMAL HIGH (ref 180–914)

## 2024-10-25 MED ORDER — POTASSIUM CHLORIDE CRYS ER 20 MEQ PO TBCR
40.0000 meq | EXTENDED_RELEASE_TABLET | Freq: Once | ORAL | Status: AC
  Administered 2024-10-25: 40 meq via ORAL
  Filled 2024-10-25 (×2): qty 2

## 2024-10-25 MED ORDER — ALBUTEROL SULFATE (2.5 MG/3ML) 0.083% IN NEBU
2.5000 mg | INHALATION_SOLUTION | Freq: Four times a day (QID) | RESPIRATORY_TRACT | Status: DC
  Administered 2024-10-25 – 2024-10-26 (×4): 2.5 mg via RESPIRATORY_TRACT
  Filled 2024-10-25 (×4): qty 3

## 2024-10-25 NOTE — Plan of Care (Signed)
  Problem: Elimination: Goal: Will not experience complications related to bowel motility Outcome: Completed/Met

## 2024-10-25 NOTE — Progress Notes (Signed)
"  ° °      Overnight   NAME: Carla Hanson MRN: [id number] DOB : 09/29/1966    Date of Service   10/25/2024   HPI/Events of Note    Notified by Attending Physician for lab value follow up when resulted if needed.   Latest Reference Range & Units 10/25/24 04:16 10/25/24 10:53 10/25/24 19:29  Sodium 135 - 145 mmol/L 140  140  Potassium 3.5 - 5.1 mmol/L 3.2 (L)  3.5  Chloride 98 - 111 mmol/L 110  109  CO2 22 - 32 mmol/L 20 (L)  22  Glucose 70 - 99 mg/dL 97  888 (H)  BUN 6 - 20 mg/dL 5 (L)  6  Creatinine 9.55 - 1.00 mg/dL 9.57 (L)  9.59 (L)  Calcium  8.9 - 10.3 mg/dL 89.0 (H)  89.0 (H)  Anion gap 5 - 15  10  8   Phosphorus  2.5 - 4.6 mg/dL 2.3 (L)    Albumin 3.5 - 5.0 g/dL 2.9 (L)    GFR, Estimated >60 mL/min >60  >60  Vitamin B12 180 - 914 pg/mL  1,022 (H)   WBC 4.0 - 10.5 K/uL 12.7 (H)  12.6 (H)  RBC 3.87 - 5.11 MIL/uL 2.91 (L)  2.97 (L)  Hemoglobin 12.0 - 15.0 g/dL 8.1 (L)  8.3 (L)  HCT 63.9 - 46.0 % 24.9 (L)  25.4 (L)  MCV 80.0 - 100.0 fL 85.6  85.5  MCH 26.0 - 34.0 pg 27.8  27.9  MCHC 30.0 - 36.0 g/dL 67.4  67.2  RDW 88.4 - 15.5 % 17.2 (H)  17.2 (H)  Platelets 150 - 400 K/uL 16 (LL)  17 (LL)  nRBC 0.0 - 0.2 % 14.7 (H)  15.4 (H)  (LL): Data is critically low (L): Data is abnormally low (H): Data is abnormally high    Interventions/ Plan   Continue Attending orders.       Lynwood Kipper BSN MSNA MSN ACNPC-AG Acute Care Nurse Practitioner [facility] [place]  "

## 2024-10-25 NOTE — Progress Notes (Signed)
 VAST consult received to obtain IV access. Patient's fluids were discontinued and currently no IV meds ordered. Patient will be going for biopsy tomorrow; unit nurse will place another consult when IV needed to allow for vein preservation and reduce risk of infection related to idle lines.

## 2024-10-25 NOTE — Progress Notes (Signed)
 Pt refused to take 0600 meds and wants to take them at 0800. Explained to pt that her K+ is very low and it can affect the heart but  was adamantly refusing and wants to take them at 0800 instead.

## 2024-10-25 NOTE — Progress Notes (Signed)
" °   10/25/24 0502  Provider Notification  Provider Name/Title A. Andrez NP  Date Provider Notified 10/25/24  Time Provider Notified 0502  Method of Notification Page  Notification Reason Critical Result (1308 managed for r/o multiple myeloma progression. Critical lab plt 16 from 17. K+ 3.2 from 3.4 posy correction. For bone marrow biopsy today)  Provider response See new orders (added K+ replacement)  Date of Provider Response 10/25/24  Time of Provider Response 0503    "

## 2024-10-25 NOTE — Progress Notes (Addendum)
 HEMATOLOGY/ONCOLOGY INPATIENT PROGRESS NOTE:   ADDENDUM:  Patient was personally and independently interviewed, examined and relevant elements of the history of present illness were reviewed in details and an assessment and plan was created. All elements of the patient's history of present illness, assessment and plan were discussed in detail with Olam JINNY Brunner, NP. The above documentation reflects our combined findings assessment and plan.   Briefly, 58 year old lady with IgG lambda multiple myeloma, followed by Dr. Onesimo in our clinic, status post multiple chemo lines most recently on carfilzomib  plus cyclophosphamide  plus dexamethasone , with current plan to continue outpatient carfilzomib  on referral to [facility] for BiTE therapy.  She was admitted to the hospital on 10/20/2024 with complaints of progressive weakness.  Her lack of compliance with treatment and poor functional status and limited social support and baseline co-morbids has made it difficult to pursue current treatment or a more aggressive course of treatment   Currently anemic and thrombocytopenic.  Transfuse as needed to maintain hemoglobin above 8.  Transfuse platelets as needed to maintain platelet count above 10,000, or above 50,000 in case of any bleeding.  Plan to proceed with bone marrow biopsy and aspiration by IR, on 10/26/2024 for further evaluation of myeloma status.  She does have history of hypercalcemia which is slightly better at 10.9.   She does have a large right upper extremity bruise, apparently iatrogenic in the ER.  She was previously on aspirin  but currently on hold because of severe thrombocytopenia.  She had SAH on Xarelto  previously.  Dr. Onesimo had discussed goals of care and also gave patient option for consideration of BSC through hospice which the patient currently declined as an option.   We will continue to follow her.  Please call us  with any questions.   Carla Hanson   DOB:10-Sep-1966    FM#:[id number]      ASSESSMENT & PLAN:  Carla Hanson is a 58 year old female patient admitted on 10/20/2024 with complaints of weakness. Oncologic history is significant for multiple myeloma with bone mets.   Refractory IgG lambda multiple myeloma -Diagnosed 02/05/2024 after bone marrow biopsy that showed hypercellular bone marrow 60% plasma cell neoplasm. - Status post multiple chemo lines.  She was started on carfilzomib  + cyclophosphamide  + Dex.  Patient missed many treatments due to personal issues.  The plan is to continue outpatient carfilzomib  and hold Cytoxan . - Due to concerns for progression of disease, we recommend repeat bone marrow biopsy, ordered and pending. - Plan referral to [facility] for consideration of BITE therapy. - Medical oncology/Dr. Onesimo following.  In his absence, being followed by medical oncology coverage/Dr. Maily Debarge today.  Thrombocytopenia, severe - Platelets very low, 16K today. - Platelets have been chronically low. - Consideration for platelet transfusion for counts <10K or <50 K with active bleeding. - Continue to monitor CBC with differential  Anemia Iron deficiency anemia Folate deficiency - Hemoglobin 8.1 - Continue ferrous sulfate  and folic acid  - Recommend PRBC transfusion for hemoglobin <8.0 - Continue to monitor CBC with differential  Hypercalcemia of malignancy - Calcium  elevated 10.9 - Status post Zometa , last given in October when he 25 - Continue to monitor CMP  Weakness - Due to low blood counts - Continue supportive care   Code Status Full   Subjective:  Patient seen awake and alert laying in bed having bloods drawn.  Reports that she feels okay, denies any acute pain.  Denies GI bleeding. Admits to ongoing weakness.  Noted with large right UE  bruise.  No acute distress is noted.  Objective:   Intake/Output Summary (Last 24 hours) at 10/25/2024 1035 Last data filed at 10/25/2024 0600 Gross per 24 hour  Intake 360 ml   Output 600 ml  Net -240 ml     PHYSICAL EXAMINATION: ECOG PERFORMANCE STATUS: 3 - Symptomatic, >50% confined to bed  Vitals:   10/25/24 0714 10/25/24 0856  BP: (!) 143/79   Pulse: (!) 102   Resp: 18   Temp: 97.8 F (36.6 C)   SpO2: 96% 97%   Filed Weights   10/20/24 1548  Weight: 204 lb 2.3 oz (92.6 kg)    GENERAL: alert, no distress and comfortable SKIN: skin color, texture, turgor are normal, no rashes or significant lesions EYES: normal, conjunctiva are pink and non-injected, sclera clear OROPHARYNX: no exudate, no erythema and lips, buccal mucosa, and tongue normal  NECK: supple, thyroid  normal size, non-tender, without nodularity LYMPH: no palpable lymphadenopathy in the cervical, axillary or inguinal LUNGS: clear to auscultation and percussion with normal breathing effort HEART: regular rate & rhythm and no murmurs and no lower extremity edema ABDOMEN: abdomen soft, non-tender and normal bowel sounds MUSCULOSKELETAL: no cyanosis of digits and no clubbing  PSYCH: alert & oriented x 3 with fluent speech NEURO: no focal motor/sensory deficits   All questions were answered. The patient knows to call the clinic with any problems, questions or concerns.   I personally spent a total of 40 minutes minutes in the care of the patient today including preparing to see the patient, performing a medically appropriate exam/evaluation, counseling and educating, referring and communicating with other health care professionals, and communicating results.    Olam PARAS Rouson, NP 10/25/2024 10:35 AM    Labs Reviewed:  Lab Results  Component Value Date   WBC 12.7 (H) 10/25/2024   HGB 8.1 (L) 10/25/2024   HCT 24.9 (L) 10/25/2024   MCV 85.6 10/25/2024   PLT 16 (LL) 10/25/2024   Recent Labs    09/22/24 1228 09/27/24 1412 10/20/24 0817 10/21/24 0504 10/23/24 0521 10/24/24 0507 10/25/24 0416  NA 138 139 140   < > 142 141 140  K 3.9 3.9 3.5   < > 3.2* 3.4* 3.2*  CL 99 105  103   < > 109 110 110  CO2 25 23 22    < > 22 22 20*  GLUCOSE 129* 132* 135*   < > 96 97 97  BUN 10 9 9    < > 7 6 5*  CREATININE 0.62 0.56 0.59   < > 0.43* 0.43* 0.42*  CALCIUM  12.3* 10.4* 12.8*   < > 11.8* 11.0* 10.9*  GFRNONAA >60 >60 >60   < > >60 >60 >60  PROT 7.7 7.8 8.2*  --   --   --   --   ALBUMIN 3.6 3.5 3.3*  --   --  2.7* 2.9*  AST 96* 28 52*  --   --   --   --   ALT 11 8 11   --   --   --   --   ALKPHOS 182* 154* 172*  --   --   --   --   BILITOT 0.5 0.5 0.9  --   --   --   --    < > = values in this interval not displayed.    Studies Reviewed:   No results found.

## 2024-10-25 NOTE — Evaluation (Signed)
 Occupational Therapy Evaluation Patient Details Name: Carla Hanson MRN: [id number] DOB: 1966/06/16 Today's Date: 10/25/2024   History of Present Illness   58 y.o. female was found in the hematologist office with a hemoglobin of 5.3. Pt with past medical history of DVT on aspirin  only due to intracranial hemorrhage while on Xarelto , essential hypertension, multiple myeloma on chemotherapy comes in with generalized weakness     Clinical Impressions PTA, pt's mother lives with her, reports typically Modified Independent with ADLs, household IADLs and mobility using a walker. However, in recent 2-3 weeks, pt reports being limited to bed and transferring to/from [facility] only. Pt reports her mother has also been assisting with ADLs bed level. Pt presents now with deficits in strength, endurance and endorses a fear of falling so evaluation limited to EOB only today. Based on functional presentation, pt requires Setup for UB ADL and Mod-Total A for LB ADLs seated/bed level. With current presentation and reported progressive weakness at home, pt will likely need continued inpatient follow up therapy, <3 hours/day at DC. Will finalize DC recommendations once OOB ADLs/transfers are able to be assessed.      If plan is discharge home, recommend the following:   A lot of help with walking and/or transfers;Two people to help with walking and/or transfers;A lot of help with bathing/dressing/bathroom     Functional Status Assessment   Patient has had a recent decline in their functional status and demonstrates the ability to make significant improvements in function in a reasonable and predictable amount of time.     Equipment Recommendations   Other (comment) (TBD pending OOB progress)     Recommendations for Other Services         Precautions/Restrictions   Precautions Precautions: Fall Restrictions Weight Bearing Restrictions Per Provider Order: No     Mobility Bed  Mobility Overal bed mobility: Needs Assistance Bed Mobility: Supine to Sit, Sit to Supine     Supine to sit: Contact guard Sit to supine: Contact guard assist        Transfers                   General transfer comment: pt declined to attempt despite education on various DME/lift equipment options and encouragement of having +2 assist for safety      Balance Overall balance assessment: Needs assistance Sitting-balance support: No upper extremity supported, Feet supported Sitting balance-Leahy Scale: Fair                                     ADL either performed or assessed with clinical judgement   ADL Overall ADL's : Needs assistance/impaired Eating/Feeding: NPO Eating/Feeding Details (indicate cue type and reason): but anticipate no issues Grooming: Set up;Sitting   Upper Body Bathing: Set up;Sitting   Lower Body Bathing: Moderate assistance;Sitting/lateral leans;Bed level   Upper Body Dressing : Set up;Sitting   Lower Body Dressing: Sitting/lateral leans;Bed level;Maximal assistance       Toileting- Clothing Manipulation and Hygiene: Total assistance;Bed level         General ADL Comments: Pt with shakiness, that she reports occurs after breathing treatment. Pt also reports an extreme fear of falling given weakness and limited time OOB at home so evaluation limited to EOB only today     Vision Ability to See in Adequate Light: 0 Adequate Patient Visual Report: No change from baseline Vision Assessment?: No apparent visual deficits  Perception         Praxis         Pertinent Vitals/Pain Pain Assessment Pain Assessment: No/denies pain     Extremity/Trunk Assessment Upper Extremity Assessment Upper Extremity Assessment: Generalized weakness;Right hand dominant   Lower Extremity Assessment Lower Extremity Assessment: Defer to PT evaluation   Cervical / Trunk Assessment Cervical / Trunk Assessment: Normal    Communication Communication Communication: No apparent difficulties   Cognition Arousal: Alert Behavior During Therapy: WFL for tasks assessed/performed Cognition: No apparent impairments             OT - Cognition Comments: anxious about mobility attempts, fearful of falling                 Following commands: Intact       Cueing  General Comments   Cueing Techniques: Verbal cues;Gestural cues      Exercises     Shoulder Instructions      Home Living Family/patient expects to be discharged to:: Private residence Living Arrangements: Parent Available Help at Discharge: Family;Available 24 hours/day Type of Home: Apartment Home Access: Level entry     Home Layout: One level     Bathroom Shower/Tub: Tub/shower unit;Sponge bathes at baseline   [employer]: Standard     Home Equipment: Rollator (4 wheels);Tub bench;Grab bars - tub/shower;Hand held shower head;BSC/3in1;Rolling Walker (2 wheels);Wheelchair - manual          Prior Functioning/Environment Prior Level of Function : Independent/Modified Independent             Mobility Comments: reports typically ambulatory with a walker but reports in recent weeks, has not been ambulating d/t weakness. Has had assist from her mother for [facility] transfers but otherwise has been in bed ADLs Comments: reports typically able to manage ADLs, IADLs. but has had increased assist for ADL mgmt (using BSC, mother assisting with bed bath and dressing). Mother assisting with IADLs.    OT Problem List: Decreased strength;Impaired balance (sitting and/or standing);Decreased activity tolerance;Obesity;Cardiopulmonary status limiting activity   OT Treatment/Interventions: Self-care/ADL training;Therapeutic exercise;Energy conservation;DME and/or AE instruction;Therapeutic activities;Patient/family education;Balance training      OT Goals(Current goals can be found in the care plan section)   Acute Rehab OT  Goals Patient Stated Goal: get back to walking, increase strength, not have a fall OT Goal Formulation: With patient Time For Goal Achievement: 11/08/24 Potential to Achieve Goals: Good   OT Frequency:  Min 2X/week    Co-evaluation PT/OT/SLP Co-Evaluation/Treatment: Yes Reason for Co-Treatment: For patient/therapist safety;To address functional/ADL transfers PT goals addressed during session: Strengthening/ROM OT goals addressed during session: Strengthening/ROM      AM-PAC OT 6 Clicks Daily Activity     Outcome Measure Help from another person eating meals?: None Help from another person taking care of personal grooming?: A Little Help from another person toileting, which includes using toliet, bedpan, or urinal?: Total Help from another person bathing (including washing, rinsing, drying)?: A Lot Help from another person to put on and taking off regular upper body clothing?: A Little Help from another person to put on and taking off regular lower body clothing?: A Lot 6 Click Score: 15   End of Session Nurse Communication: Mobility status  Activity Tolerance: Patient tolerated treatment well Patient left: in bed;with call bell/phone within reach;with bed alarm set  OT Visit Diagnosis: Other abnormalities of gait and mobility (R26.89);Muscle weakness (generalized) (M62.81);Unsteadiness on feet (R26.81)  Time: 9065-9041 OT Time Calculation (min): 24 min Charges:  OT General Charges $OT Visit: 1 Visit OT Evaluation $OT Eval Moderate Complexity: 1 Mod  Mliss NOVAK, OTR/L Acute Rehab Services Office: [phone]   Mliss Fish 10/25/2024, 10:14 AM

## 2024-10-25 NOTE — Evaluation (Signed)
 Physical Therapy Evaluation Patient Details Name: Carla Hanson MRN: [id number] DOB: 09-18-1966 Today's Date: 10/25/2024  History of Present Illness  58 y.o. female was found in the hematologist office with a hemoglobin of 5.3. Pt with past medical history of DVT on aspirin  only due to intracranial hemorrhage while on Xarelto , essential hypertension, multiple myeloma on chemotherapy comes in with generalized weakness  Clinical Impression  Pt admitted with above diagnosis. Pt reports she walks short household distances with a RW at baseline but has only been able to transfer to a bedside commode for the past 2 weeks. Today pt performed supine to sit without physical assist, sat edge of bed ~9 minutes. She declined to attempt transfers 2* fatigue and fear of falling. Benefits of mobility and options for DME for safe transfers were explained to pt, she continued to decline getting out of bed. She may need ST-SNF depending on progress.  Pt currently with functional limitations due to the deficits listed below (see PT Problem List). Pt will benefit from acute skilled PT to increase their independence and safety with mobility to allow discharge.           If plan is discharge home, recommend the following: A lot of help with walking and/or transfers;A lot of help with bathing/dressing/bathroom;Assistance with cooking/housework;Assist for transportation;Help with stairs or ramp for entrance   Can travel by private vehicle   No    Equipment Recommendations None recommended by PT  Recommendations for Other Services       Functional Status Assessment Patient has had a recent decline in their functional status and demonstrates the ability to make significant improvements in function in a reasonable and predictable amount of time.     Precautions / Restrictions Precautions Precautions: Fall Recall of Precautions/Restrictions: Intact Precaution/Restrictions Comments: denies h/o falls in past 6  months Restrictions Weight Bearing Restrictions Per Provider Order: No      Mobility  Bed Mobility Overal bed mobility: Needs Assistance Bed Mobility: Supine to Sit, Sit to Supine     Supine to sit: Contact guard, HOB elevated, Used rails Sit to supine: Contact guard assist   General bed mobility comments: +2 max to scoot up in bed    Transfers                   General transfer comment: pt declined to attempt despite education on various DME/lift equipment options and encouragement of having +2 assist for safety; pt sat EOB ~9 minutes.    Ambulation/Gait                  Stairs            Wheelchair Mobility     Tilt Bed    Modified Rankin (Stroke Patients Only)       Balance Overall balance assessment: Needs assistance Sitting-balance support: No upper extremity supported, Feet supported Sitting balance-Leahy Scale: Fair                                       Pertinent Vitals/Pain Pain Assessment Pain Assessment: No/denies pain    Home Living Family/patient expects to be discharged to:: Private residence Living Arrangements: Parent Available Help at Discharge: Family;Available 24 hours/day Type of Home: Apartment Home Access: Level entry       Home Layout: One level Home Equipment: Rollator (4 wheels);Tub bench;Grab bars - tub/shower;Hand held shower head;BSC/3in1;Rolling [employer] (  2 wheels);Wheelchair - manual      Prior Function Prior Level of Function : Independent/Modified Independent             Mobility Comments: reports typically ambulatory with a walker but reports in recent weeks, has not been ambulating d/t weakness. Has had assist from her mother for [facility] transfers but otherwise has been in bed ADLs Comments: reports typically able to manage ADLs, IADLs. but has had increased assist for ADL mgmt (using BSC, mother assisting with bed bath and dressing). Mother assisting with IADLs.      Extremity/Trunk Assessment   Upper Extremity Assessment Upper Extremity Assessment: Defer to OT evaluation    Lower Extremity Assessment Lower Extremity Assessment: Overall WFL for tasks assessed    Cervical / Trunk Assessment Cervical / Trunk Assessment: Normal  Communication   Communication Communication: No apparent difficulties    Cognition Arousal: Alert Behavior During Therapy: WFL for tasks assessed/performed                             Following commands: Intact       Cueing Cueing Techniques: Verbal cues, Gestural cues     General Comments      Exercises General Exercises - Lower Extremity Ankle Circles/Pumps: AROM, Both, 10 reps, Supine Hip Flexion/Marching: AROM, Both, 10 reps, Seated   Assessment/Plan    PT Assessment Patient needs continued PT services  PT Problem List Decreased activity tolerance;Decreased mobility       PT Treatment Interventions Gait training;Therapeutic exercise;Therapeutic activities;Functional mobility training;Patient/family education    PT Goals (Current goals can be found in the Care Plan section)  Acute Rehab PT Goals Patient Stated Goal: likes going to the gym to lift weights, housecleaning PT Goal Formulation: With patient Time For Goal Achievement: 11/08/24 Potential to Achieve Goals: Good    Frequency Min 3X/week     Co-evaluation   Reason for Co-Treatment: For patient/therapist safety;To address functional/ADL transfers PT goals addressed during session: Strengthening/ROM OT goals addressed during session: Strengthening/ROM       AM-PAC PT 6 Clicks Mobility  Outcome Measure Help needed turning from your back to your side while in a flat bed without using bedrails?: A Little Help needed moving from lying on your back to sitting on the side of a flat bed without using bedrails?: A Little Help needed moving to and from a bed to a chair (including a wheelchair)?: A Lot Help needed standing  up from a chair using your arms (e.g., wheelchair or bedside chair)?: A Lot Help needed to walk in hospital room?: A Lot Help needed climbing 3-5 steps with a railing? : Total 6 Click Score: 13    End of Session   Activity Tolerance: Patient limited by fatigue Patient left: in bed;with bed alarm set;with call bell/phone within reach Nurse Communication: Mobility status;Need for lift equipment PT Visit Diagnosis: Difficulty in walking, not elsewhere classified (R26.2)    Time: 0940-1000 PT Time Calculation (min) (ACUTE ONLY): 20 min   Charges:   PT Evaluation $PT Eval Moderate Complexity: 1 Mod   PT General Charges $$ ACUTE PT VISIT: 1 Visit        Sylvan Delon Copp PT 10/25/2024  Acute Rehabilitation Services  Office [phone]

## 2024-10-25 NOTE — Progress Notes (Signed)
 " PROGRESS NOTE    Carla Hanson  FMW:[id number] DOB: February 05, 1966 DOA: 10/20/2024 PCP: Anders Otto DASEN, MD     Brief Narrative:  Carla Hanson is an 58 y.o. 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  Was found to have a big bruise on her right upper extremity.  She was transfused 2 unit packed red blood cell 12/17, another 2 unit 12/18.  New events last 24 hours / Subjective: Admits to some shortness of breath at rest, asking for oxygen.  Asked RN to check pulse ox which was 97% on room air.  No reports of bleeding  Assessment & Plan:    Principal Problem:   Symptomatic anemia Active Problems:   Essential hypertension   History of pulmonary embolism   Iron deficiency anemia   Anemia   Hypercalcemia of malignancy   Multiple myeloma not having achieved remission (HCC)   Folate deficiency   Anemia due to antineoplastic agent   Counseling regarding advance care planning and goals of care   Thrombocytopenia   Symptomatic anemia, thrombocytopenia Multiple myeloma - Transfused 4 unit packed red blood cell since admission - Oncology following - Bone marrow biopsy ?12/23 - Transfuse for hemoglobin <8, platelet <10  Hypercalcemia of malignancy - Calcitonin  Hypokalemia - Replete  Hypertension - Cozaar   History of PE - Not on anticoagulant due to history of intracranial hemorrhage.  She is on aspirin  as outpatient, currently holding due to thrombocytopenia   DVT prophylaxis:  SCDs Start: 10/20/24 1049  Code Status: Full Family Communication: None at bedside Disposition Plan:  Home  Status is: Observation The patient will require care spanning > 2 midnights and should be moved to inpatient because: Bone marrow biopsy  tomorrow     Antimicrobials:  Anti-infectives (From admission, onward)    None        Objective: Vitals:   10/24/24 2200 10/25/24 0714 10/25/24 0856 10/25/24 1100  BP: (!) 160/82 (!) 143/79  (!) 145/63  Pulse: (!) 102 (!) 102  (!) 107  Resp: 16 18  14   Temp: 97.9 F (36.6 C) 97.8 F (36.6 C)  97.9 F (36.6 C)  TempSrc: Oral Oral  Oral  SpO2: 97% 96% 97% 98%  Weight:      Height:        Intake/Output Summary (Last 24 hours) at 10/25/2024 1203 Last data filed at 10/25/2024 0600 Gross per 24 hour  Intake 360 ml  Output 600 ml  Net -240 ml   Filed Weights   10/20/24 1548  Weight: 92.6 kg    Examination:  General exam: Appears calm and comfortable  Respiratory system: Clear to auscultation. Respiratory effort normal. No respiratory distress. No conversational dyspnea.  On room air Cardiovascular system: S1 & S2 heard, RRR Gastrointestinal system: Abdomen is nondistended, soft  Central nervous system: Alert and oriented. No focal neurological deficits. Speech clear.  Extremities: Symmetric in appearance  Skin: No rashes, lesions or ulcers on exposed skin  Psychiatry: Judgement and insight appear normal. Mood & affect appropriate.   Data Reviewed: I have personally reviewed following labs and imaging studies  CBC: Recent Labs  Lab 10/20/24 0817 10/20/24 0817 10/21/24 0504 10/21/24 2246 10/22/24 1038 10/23/24 0521 10/24/24 0821 10/25/24 0416  WBC 17.1*  --  12.3*  --  15.3* 13.8* 14.0* 12.7*  NEUTROABS 7.0  --   --   --  6.7  --  5.6 4.8  HGB 5.9*   < > 6.1* 8.5* 9.2* 8.6* 8.3* 8.1*  HCT 18.1*  --  18.9* 25.0* 27.5* 26.2* 25.2* 24.9*  MCV 83.0  --  85.9  --  84.4 84.8 85.7 85.6  PLT 31*  --  20*  --  22* 19* 17* 16*   < > = values in this interval not displayed.   Basic Metabolic Panel: Recent Labs  Lab 10/21/24 0504 10/22/24 1038 10/23/24 0521 10/24/24 0507 10/25/24 0416  NA 139 140 142 141 140  K 3.1* 3.5 3.2* 3.4* 3.2*  CL 106 107 109 110  110  CO2 25 22 22 22  20*  GLUCOSE 104* 89 96 97 97  BUN 9 7 7 6  5*  CREATININE 0.48 0.45 0.43* 0.43* 0.42*  CALCIUM  12.1* 12.5* 11.8* 11.0* 10.9*  PHOS  --   --   --  2.0* 2.3*   GFR: Estimated Creatinine Clearance: 81.3 mL/min (A) (by C-G formula based on SCr of 0.42 mg/dL (L)). Liver Function Tests: Recent Labs  Lab 10/20/24 0817 10/24/24 0507 10/25/24 0416  AST 52*  --   --   ALT 11  --   --   ALKPHOS 172*  --   --   BILITOT 0.9  --   --   PROT 8.2*  --   --   ALBUMIN 3.3* 2.7* 2.9*   No results for input(s): LIPASE, AMYLASE in the last 168 hours. No results for input(s): AMMONIA in the last 168 hours. Coagulation Profile: No results for input(s): INR, PROTIME in the last 168 hours. Cardiac Enzymes: No results for input(s): CKTOTAL, CKMB, CKMBINDEX, TROPONINI in the last 168 hours. BNP (last 3 results) No results for input(s): PROBNP in the last 8760 hours. HbA1C: No results for input(s): HGBA1C in the last 72 hours. CBG: No results for input(s): GLUCAP in the last 168 hours. Lipid Profile: No results for input(s): CHOL, HDL, LDLCALC, TRIG, CHOLHDL, LDLDIRECT in the last 72 hours. Thyroid  Function Tests: No results for input(s): TSH, T4TOTAL, FREET4, T3FREE, THYROIDAB in the last 72 hours. Anemia Panel: No results for input(s): VITAMINB12, FOLATE, FERRITIN, TIBC, IRON, RETICCTPCT in the last 72 hours. Sepsis Labs: No results for input(s): PROCALCITON, LATICACIDVEN in the last 168 hours.  No results found for this or any previous visit (from the past 240 hours).    Radiology Studies: No results found.    Scheduled Meds:  albuterol   2.5 mg Nebulization QID   calcitonin (salmon)  1 spray Alternating Nares Daily   ferrous sulfate   325 mg Oral Q breakfast   folic acid   1 mg Oral BID   loratadine   10 mg Oral QHS   losartan   25 mg Oral Daily   pantoprazole   40 mg Oral Q0600   thiamine   100 mg Oral  Daily   Continuous Infusions:   LOS: 0 days   Time spent: 25 minutes   Delon Hoe, DO [facility] 10/25/2024, 12:03 PM   Available via Epic secure chat 7am-7pm After these hours, please refer to coverage provider listed on [email or web address]  "

## 2024-10-26 ENCOUNTER — Inpatient Hospital Stay: Admitting: Physician Assistant

## 2024-10-26 ENCOUNTER — Observation Stay (HOSPITAL_COMMUNITY)

## 2024-10-26 ENCOUNTER — Inpatient Hospital Stay

## 2024-10-26 ENCOUNTER — Inpatient Hospital Stay: Admitting: Dietician

## 2024-10-26 DIAGNOSIS — D696 Thrombocytopenia, unspecified: Secondary | ICD-10-CM | POA: Diagnosis not present

## 2024-10-26 DIAGNOSIS — Z7189 Other specified counseling: Secondary | ICD-10-CM | POA: Diagnosis not present

## 2024-10-26 DIAGNOSIS — I119 Hypertensive heart disease without heart failure: Secondary | ICD-10-CM | POA: Diagnosis present

## 2024-10-26 DIAGNOSIS — D509 Iron deficiency anemia, unspecified: Secondary | ICD-10-CM | POA: Diagnosis present

## 2024-10-26 DIAGNOSIS — E785 Hyperlipidemia, unspecified: Secondary | ICD-10-CM | POA: Diagnosis present

## 2024-10-26 DIAGNOSIS — I4892 Unspecified atrial flutter: Secondary | ICD-10-CM | POA: Diagnosis not present

## 2024-10-26 DIAGNOSIS — R4589 Other symptoms and signs involving emotional state: Secondary | ICD-10-CM | POA: Diagnosis not present

## 2024-10-26 DIAGNOSIS — T451X5A Adverse effect of antineoplastic and immunosuppressive drugs, initial encounter: Secondary | ICD-10-CM | POA: Diagnosis present

## 2024-10-26 DIAGNOSIS — D61818 Other pancytopenia: Secondary | ICD-10-CM | POA: Diagnosis present

## 2024-10-26 DIAGNOSIS — K219 Gastro-esophageal reflux disease without esophagitis: Secondary | ICD-10-CM | POA: Diagnosis present

## 2024-10-26 DIAGNOSIS — Z515 Encounter for palliative care: Secondary | ICD-10-CM | POA: Diagnosis not present

## 2024-10-26 DIAGNOSIS — D6481 Anemia due to antineoplastic chemotherapy: Secondary | ICD-10-CM | POA: Diagnosis present

## 2024-10-26 DIAGNOSIS — Z7401 Bed confinement status: Secondary | ICD-10-CM | POA: Diagnosis not present

## 2024-10-26 DIAGNOSIS — R0600 Dyspnea, unspecified: Secondary | ICD-10-CM | POA: Diagnosis not present

## 2024-10-26 DIAGNOSIS — R52 Pain, unspecified: Secondary | ICD-10-CM | POA: Diagnosis not present

## 2024-10-26 DIAGNOSIS — Z6837 Body mass index (BMI) 37.0-37.9, adult: Secondary | ICD-10-CM | POA: Diagnosis not present

## 2024-10-26 DIAGNOSIS — E876 Hypokalemia: Secondary | ICD-10-CM | POA: Diagnosis present

## 2024-10-26 DIAGNOSIS — D649 Anemia, unspecified: Secondary | ICD-10-CM | POA: Diagnosis not present

## 2024-10-26 DIAGNOSIS — I483 Typical atrial flutter: Secondary | ICD-10-CM | POA: Diagnosis not present

## 2024-10-26 DIAGNOSIS — C9000 Multiple myeloma not having achieved remission: Secondary | ICD-10-CM | POA: Diagnosis present

## 2024-10-26 DIAGNOSIS — J90 Pleural effusion, not elsewhere classified: Secondary | ICD-10-CM | POA: Diagnosis present

## 2024-10-26 DIAGNOSIS — E66812 Obesity, class 2: Secondary | ICD-10-CM | POA: Diagnosis present

## 2024-10-26 DIAGNOSIS — Z86711 Personal history of pulmonary embolism: Secondary | ICD-10-CM | POA: Diagnosis not present

## 2024-10-26 DIAGNOSIS — D6959 Other secondary thrombocytopenia: Secondary | ICD-10-CM | POA: Diagnosis present

## 2024-10-26 LAB — COMPREHENSIVE METABOLIC PANEL WITH GFR
ALT: 6 U/L (ref 0–44)
AST: 34 U/L (ref 15–41)
Albumin: 2.8 g/dL — ABNORMAL LOW (ref 3.5–5.0)
Alkaline Phosphatase: 141 U/L — ABNORMAL HIGH (ref 38–126)
Anion gap: 11 (ref 5–15)
BUN: 6 mg/dL (ref 6–20)
CO2: 22 mmol/L (ref 22–32)
Calcium: 11.1 mg/dL — ABNORMAL HIGH (ref 8.9–10.3)
Chloride: 109 mmol/L (ref 98–111)
Creatinine, Ser: 0.39 mg/dL — ABNORMAL LOW (ref 0.44–1.00)
GFR, Estimated: >60 mL/min
Glucose, Bld: 101 mg/dL — ABNORMAL HIGH (ref 70–99)
Potassium: 3.2 mmol/L — ABNORMAL LOW (ref 3.5–5.1)
Sodium: 142 mmol/L (ref 135–145)
Total Bilirubin: 0.8 mg/dL (ref 0.0–1.2)
Total Protein: 7.2 g/dL (ref 6.5–8.1)

## 2024-10-26 LAB — CBC
HCT: 24.7 % — ABNORMAL LOW (ref 36.0–46.0)
Hemoglobin: 7.9 g/dL — ABNORMAL LOW (ref 12.0–15.0)
MCH: 27.6 pg (ref 26.0–34.0)
MCHC: 32 g/dL (ref 30.0–36.0)
MCV: 86.4 fL (ref 80.0–100.0)
Platelets: 18 K/uL — CL (ref 150–400)
RBC: 2.86 MIL/uL — ABNORMAL LOW (ref 3.87–5.11)
RDW: 17.3 % — ABNORMAL HIGH (ref 11.5–15.5)
WBC: 11.7 K/uL — ABNORMAL HIGH (ref 4.0–10.5)
nRBC: 15.1 % — ABNORMAL HIGH (ref 0.0–0.2)

## 2024-10-26 LAB — MAGNESIUM: Magnesium: 1.5 mg/dL — ABNORMAL LOW (ref 1.7–2.4)

## 2024-10-26 LAB — PATHOLOGIST SMEAR REVIEW

## 2024-10-26 MED ORDER — SODIUM CHLORIDE 0.9 % IV SOLN: INTRAVENOUS | Status: DC

## 2024-10-26 MED ORDER — POTASSIUM CHLORIDE 10 MEQ/100ML IV SOLN
10.0000 meq | INTRAVENOUS | Status: AC
  Administered 2024-10-26 (×4): 10 meq via INTRAVENOUS
  Filled 2024-10-26 (×4): qty 100

## 2024-10-26 MED ORDER — ALBUTEROL SULFATE (2.5 MG/3ML) 0.083% IN NEBU
2.5000 mg | INHALATION_SOLUTION | Freq: Three times a day (TID) | RESPIRATORY_TRACT | Status: DC
  Administered 2024-10-26: 2.5 mg via RESPIRATORY_TRACT
  Filled 2024-10-26: qty 3

## 2024-10-26 MED ORDER — ALBUTEROL SULFATE (2.5 MG/3ML) 0.083% IN NEBU
2.5000 mg | INHALATION_SOLUTION | RESPIRATORY_TRACT | Status: DC
  Administered 2024-10-26 – 2024-10-27 (×2): 2.5 mg via RESPIRATORY_TRACT
  Filled 2024-10-26 (×2): qty 3

## 2024-10-26 MED ORDER — MAGNESIUM SULFATE 4 GM/100ML IV SOLN
4.0000 g | Freq: Once | INTRAVENOUS | Status: AC
  Administered 2024-10-26: 4 g via INTRAVENOUS
  Filled 2024-10-26: qty 100

## 2024-10-26 MED ORDER — ACYCLOVIR 400 MG PO TABS
400.0000 mg | ORAL_TABLET | Freq: Two times a day (BID) | ORAL | Status: DC
  Administered 2024-10-26 – 2024-11-05 (×21): 400 mg via ORAL
  Filled 2024-10-26 (×12): qty 1

## 2024-10-26 NOTE — TOC Initial Note (Signed)
 Transition of Care [facility]) - Initial/Assessment Note    Patient Details  Name: Carla Hanson MRN: [id number] Date of Birth: 1966/05/28  Transition of Care [facility]) CM/SW Contact:    NORMAN ASPEN, LCSW Phone Number: 10/26/2024, 2:07 PM  Clinical Narrative:                  Received TOC order yesterday to assist with potential SNF placement.  Met with pt today who is in agreement with this plan and reports she is much weaker than she was a few weeks ago.  She lives with her mother, however, does not believe her mother can provide current level of support needed.  Pt does confirm that she has all needed DME in the home. We discussed the SNF work up process and main barrier may be her Medicaid only coverage.  She understands and is agreeable for CSW to begin bed search.  Notably, pt is still awaiting biopsy (tomorrow?) and not clear on when she may be medically ready for dc to SNF or home (dependent on functional improvement).    IP CM will continue to follow.  Expected Discharge Plan: Skilled Nursing Facility (vs. home with [facility]) Barriers to Discharge: Continued Medical Work up, English As A Second Language Teacher   Patient Goals and CMS Choice Patient states their goals for this hospitalization and ongoing recovery are:: pt prefers to be able to dc home, however, limited support and feels short term SNF may be needed          Expected Discharge Plan and Services In-house Referral: Clinical Social Work   Post Acute Care Choice: Skilled Nursing Facility Living arrangements for the past 2 months: Apartment                 DME Arranged: N/A DME Agency: NA                  Prior Living Arrangements/Services Living arrangements for the past 2 months: Apartment Lives with:: Parents Patient language and need for interpreter reviewed:: Yes Do you feel safe going back to the place where you live?: Yes      Need for Family Participation in Patient Care: Yes (Comment) Care giver support system in  place?: Yes (comment) Current home services: DME (at home pt has a wheelchair, rollator, rolling walker, tub bench and bedside commode) Criminal Activity/Legal Involvement Pertinent to Current Situation/Hospitalization: No - Comment as needed  Activities of Daily Living   ADL Screening (condition at time of admission) Independently performs ADLs?: Yes (appropriate for developmental age) Is the patient deaf or have difficulty hearing?: No Does the patient have difficulty seeing, even when wearing glasses/contacts?: No Does the patient have difficulty concentrating, remembering, or making decisions?: No  Permission Sought/Granted Permission sought to share information with : Family Supports, Oceanographer granted to share information with : Yes, Verbal Permission Granted  Share Information with NAME: mother, Zettie Lee @ [phone]  Permission granted to share info w AGENCY: SNFs        Emotional Assessment Appearance:: Appears stated age Attitude/Demeanor/Rapport: Gracious Affect (typically observed): Accepting Orientation: : Oriented to Self, Oriented to Place, Oriented to  Time, Oriented to Situation Alcohol  / Substance Use: Not Applicable Psych Involvement: No (comment)  Admission diagnosis:  Multiple myeloma not having achieved remission (HCC) [C90.00] Essential hypertension [I10] Symptomatic anemia [D64.9] Anemia, unspecified type [D64.9] Allergic rhinitis due to other allergic trigger, unspecified seasonality [J30.89] Patient Active Problem List   Diagnosis Date Noted   Thrombocytopenia 10/23/2024  Anemia due to antineoplastic agent 10/21/2024   Counseling regarding advance care planning and goals of care 10/21/2024   Symptomatic anemia 10/20/2024   Moderate protein-calorie malnutrition 09/21/2024   Chemotherapy-induced neutropenia 09/09/2024   Folate deficiency 09/09/2024   Pancytopenia (HCC) 09/08/2024   History of compression fracture  of spine 04/12/2024   Electrolyte abnormality 02/10/2024   Edema leg 02/10/2024   Hypercalcemia of malignancy 02/02/2024   Multiple myeloma not having achieved remission (HCC) 02/02/2024   Hypokalemia 01/30/2024   BMI 40.0-44.9, adult (HCC) 12/18/2022   Chronic pain of both shoulders 09/05/2022   Vitamin D  deficiency 12/19/2020   Palpitation 12/03/2019   History of intracranial hemorrhage 08/02/2019   History of DVT of lower extremity 08/02/2019   History of seizure 08/02/2019   COPD, moderate (HCC) 12/11/2018   Anemia 02/11/2018   History of hemorrhagic cerebrovascular accident (CVA) without residual deficits 08/13/2017   Iron deficiency anemia 12/12/2016   Pre-diabetes 03/02/2013   History of pulmonary embolism 05/05/2012   Former tobacco use 08/23/2009   Hyperlipidemia 04/30/2007   Essential hypertension 01/01/2007   PCP:  Anders Otto DASEN, MD Pharmacy:   [facility] [address] [place] [address] Phone: [phone] Fax: [phone]     Social Drivers of Health (SDOH) Social History: SDOH Screenings   Food Insecurity: No Food Insecurity (10/20/2024)  Housing: Low Risk (10/20/2024)  Transportation Needs: No Transportation Needs (10/20/2024)  Utilities: Not At Risk (10/20/2024)  Depression (PHQ2-9): Low Risk (09/08/2024)  Financial Resource Strain: Low Risk (05/14/2024)  Physical Activity: Insufficiently Active (05/14/2024)  Social Connections: Moderately Integrated (10/20/2024)  Stress: No Stress Concern Present (05/14/2024)  Tobacco Use: Medium Risk (10/20/2024)   SDOH Interventions:     Readmission Risk Interventions    10/26/2024    2:05 PM 02/03/2024    1:14 PM  Readmission Risk Prevention Plan  Transportation Screening Complete Complete  PCP or Specialist Appt within 3-5 Days Complete Complete  HRI or Home Care Consult Complete   Social Work Consult for Recovery Care Planning/Counseling Complete Complete   Palliative Care Screening  Not Applicable  Medication Review Oceanographer) Complete Complete

## 2024-10-26 NOTE — [place] FL2 (Signed)
 " [place]  MEDICAID FL2 LEVEL OF CARE FORM     IDENTIFICATION  Patient Name: Carla Hanson Birthdate: 03-09-1966 Sex: female Admission Date (Current Location): 10/20/2024  [place] and [place] Number:  [place] [id number] Facility and Address:  [facility],  [address] [place], [place] [address]      Provider Number: [id number]  Attending Physician Name and Address:  Verdene Purchase, MD  Relative Name and Phone Number:  mother, Zettie Lee @ [phone]    Current Level of Care: Hospital Recommended Level of Care: Skilled Nursing Facility Prior Approval Number:    Date Approved/Denied:   PASRR Number: [id number]  Discharge Plan: SNF    Current Diagnoses: Patient Active Problem List   Diagnosis Date Noted   Thrombocytopenia 10/23/2024   Anemia due to antineoplastic agent 10/21/2024   Counseling regarding advance care planning and goals of care 10/21/2024   Symptomatic anemia 10/20/2024   Moderate protein-calorie malnutrition 09/21/2024   Chemotherapy-induced neutropenia 09/09/2024   Folate deficiency 09/09/2024   Pancytopenia (HCC) 09/08/2024   History of compression fracture of spine 04/12/2024   Electrolyte abnormality 02/10/2024   Edema leg 02/10/2024   Hypercalcemia of malignancy 02/02/2024   Multiple myeloma not having achieved remission (HCC) 02/02/2024   Hypokalemia 01/30/2024   BMI 40.0-44.9, adult (HCC) 12/18/2022   Chronic pain of both shoulders 09/05/2022   Vitamin D  deficiency 12/19/2020   Palpitation 12/03/2019   History of intracranial hemorrhage 08/02/2019   History of DVT of lower extremity 08/02/2019   History of seizure 08/02/2019   COPD, moderate (HCC) 12/11/2018   Anemia 02/11/2018   History of hemorrhagic cerebrovascular accident (CVA) without residual deficits 08/13/2017   Iron deficiency anemia 12/12/2016   Pre-diabetes 03/02/2013   History of pulmonary embolism 05/05/2012   Former tobacco use 08/23/2009   Hyperlipidemia  04/30/2007   Essential hypertension 01/01/2007    Orientation RESPIRATION BLADDER Height & Weight     Self, Time, Situation, Place  Normal Continent (currently with purewick) Weight: 204 lb 2.3 oz (92.6 kg) Height:  5' 1.5 (156.2 cm)  BEHAVIORAL SYMPTOMS/MOOD NEUROLOGICAL BOWEL NUTRITION STATUS      Continent Diet (regular)  AMBULATORY STATUS COMMUNICATION OF NEEDS Skin   Extensive Assist Verbally Normal                       Personal Care Assistance Level of Assistance  Bathing, Dressing Bathing Assistance: Limited assistance   Dressing Assistance: Limited assistance     Functional Limitations Info  Sight, Hearing, Speech Sight Info: Adequate Hearing Info: Adequate Speech Info: Adequate    SPECIAL CARE FACTORS FREQUENCY  PT (By licensed PT), OT (By licensed OT)     PT Frequency: 5x/wk OT Frequency: 5x/wk            Contractures Contractures Info: Not present    Additional Factors Info  Code Status, Allergies Code Status Info: Full Allergies Info: Lurasidone            Current Medications (10/26/2024):  This is the current hospital active medication list Current Facility-Administered Medications  Medication Dose Route Frequency Provider Last Rate Last Admin   0.9 %  sodium chloride  infusion   Intravenous Continuous Krishnan, Gokul, MD 75 mL/hr at 10/26/24 1030 New Bag at 10/26/24 1030   acetaminophen  (TYLENOL ) tablet 650 mg  650 mg Oral Q6H PRN Zella Katha HERO, MD   650 mg at 10/22/24 0601   Or   acetaminophen  (TYLENOL ) suppository 650 mg  650 mg Rectal  Q6H PRN Zella, Mir M, MD       acyclovir  (ZOVIRAX ) tablet 400 mg  400 mg Oral BID Krishnan, Gokul, MD   400 mg at 10/26/24 1155   albuterol  (PROVENTIL ) (2.5 MG/3ML) 0.083% nebulizer solution 2.5 mg  2.5 mg Nebulization Q4H PRN Odell Celinda Balo, MD   2.5 mg at 10/26/24 9688   albuterol  (PROVENTIL ) (2.5 MG/3ML) 0.083% nebulizer solution 2.5 mg  2.5 mg Nebulization TID Krishnan, Gokul, MD   2.5  mg at 10/26/24 1227   artificial tears ophthalmic solution 1 drop  1 drop Both Eyes TID PRN Zella Katha HERO, MD       calcitonin (salmon) (MIACALCIN MARCIANA) nasal spray 1 spray  1 spray Alternating Nares Daily Onesimo Emaline Brink, MD   1 spray at 10/26/24 1037   ferrous sulfate  tablet 325 mg  325 mg Oral Q breakfast Zella, Mir M, MD   325 mg at 10/26/24 0920   folic acid  (FOLVITE ) tablet 1 mg  1 mg Oral BID Zella Katha HERO, MD   1 mg at 10/26/24 9078   loratadine  (CLARITIN ) tablet 10 mg  10 mg Oral QHS Zella, Mir M, MD   10 mg at 10/25/24 2110   losartan  (COZAAR ) tablet 25 mg  25 mg Oral Daily Ikramullah, Mir M, MD   25 mg at 10/26/24 9078   ondansetron  (ZOFRAN ) tablet 4 mg  4 mg Oral Q6H PRN Zella Katha HERO, MD       Or   ondansetron  (ZOFRAN ) injection 4 mg  4 mg Intravenous Q6H PRN Zella, Mir M, MD       pantoprazole  (PROTONIX ) EC tablet 40 mg  40 mg Oral Q0600 Ikramullah, Mir M, MD   40 mg at 10/25/24 9053   potassium chloride  10 mEq in 100 mL IVPB  10 mEq Intravenous Q1 Hr x 4 Krishnan, Gokul, MD 100 mL/hr at 10/26/24 1317 10 mEq at 10/26/24 1317   thiamine  (VITAMIN B1) tablet 100 mg  100 mg Oral Daily Zella, Mir M, MD   100 mg at 10/26/24 9078   traZODone  (DESYREL ) tablet 25 mg  25 mg Oral QHS PRN Zella Katha HERO, MD         Discharge Medications: Please see discharge summary for a list of discharge medications.  Relevant Imaging Results:  Relevant Lab Results:   Additional Information SS# [id number]  NORMAN ASPEN, LCSW     "

## 2024-10-26 NOTE — Progress Notes (Signed)
 " PROGRESS NOTE    Carla Hanson  FMW:[id number] DOB: 08/06/1966 DOA: 10/20/2024 PCP: Anders Otto DASEN, MD     Brief Narrative:  Carla Hanson is an 58 y.o. 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  Was found to have a big bruise on her right upper extremity.  She was transfused 2 unit packed red blood cell 12/17, another 2 unit 12/18.  Subjective: Patient complains of shortness of breath.  Apparently she has been complaining of shortness of breath for a few days now.  Saturation levels have been okay.  Patient denies any pain issues.  Does feel weak and deconditioned.    Assessment & Plan:   Multiple myeloma/Symptomatic anemia, thrombocytopenia Patient has been transfused 4 units of PRBC since admission. Medical oncology is following and managing. It appears the plan is for bone marrow biopsy today. Transfuse platelets if counts are below 10,000.  Platelets noted to be 18,000 this morning. Hemoglobin is 7.9.  Transfuse if it is less than 7.  Dyspnea Saturation levels are noted to be normal.  She complained of shortness of breath even yesterday.  No wheezing heard on examination.  Will proceed with chest x-ray.  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  Unclear if she received any bisphosphonate recently.  Calcium  level stable for the most part.  Continue to monitor.  Will hydrate her for 24 hours since she has been n.p.o.  Hypokalemia/hypomagnesemia Continue to supplement.  Essential hypertension Noted to be on losartan .  Monitor blood pressures closely.  History of PE - Not on anticoagulant due to history of intracranial hemorrhage.  She is on aspirin  as outpatient, currently holding due to thrombocytopenia   DVT  prophylaxis: SCDs Code Status: Full Family Communication: None at bedside Disposition Plan:  Home when improved   Objective: Vitals:   10/25/24 1620 10/25/24 2058 10/26/24 0545 10/26/24 0735  BP: 138/61 (!) 153/80 (!) 176/73   Pulse: (!) 104 (!) 106 (!) 106   Resp: 16 17 15    Temp: 98.2 F (36.8 C) 97.6 F (36.4 C) 98 F (36.7 C)   TempSrc:  Oral Oral   SpO2: 96% 96% 94% 95%  Weight:      Height:        Intake/Output Summary (Last 24 hours) at 10/26/2024 1041 Last data filed at 10/26/2024 0949 Gross per 24 hour  Intake 480 ml  Output 1050 ml  Net -570 ml   Filed Weights   10/20/24 1548  Weight: 92.6 kg    Examination:   General appearance: Awake alert.  In no distress Resp: Clear to auscultation bilaterally.  Normal effort Cardio: S1-S2 is normal regular.  No S3-S4.  No rubs murmurs or bruit GI: Abdomen is soft.  Nontender nondistended.  Bowel sounds are present normal.  No masses organomegaly Extremities: No edema.  Able to move her extremities though physical deconditioning is noted. Neurologic: Alert and oriented x3.  No focal neurological deficits.      Data Reviewed: I have personally reviewed following labs and reports of imaging studies  CBC: Recent Labs  Lab 10/20/24 0817 10/21/24 0504 10/22/24 1038 10/23/24 0521 10/24/24 0821 10/25/24 0416 10/25/24 1929 10/26/24 0502  WBC 17.1*   < > 15.3* 13.8* 14.0* 12.7* 12.6* 11.7*  NEUTROABS 7.0  --  6.7  --  5.6 4.8  --   --   HGB 5.9*   < > 9.2* 8.6* 8.3* 8.1* 8.3* 7.9*  HCT 18.1*   < > 27.5* 26.2* 25.2* 24.9* 25.4* 24.7*  MCV 83.0   < > 84.4 84.8 85.7 85.6 85.5 86.4  PLT 31*   < > 22* 19* 17* 16* 17* 18*   < > = values in this interval not displayed.   Basic Metabolic Panel: Recent Labs  Lab 10/23/24 0521 10/24/24 0507 10/25/24 0416 10/25/24 1929 10/26/24 0502  NA 142 141 140 140 142  K 3.2* 3.4* 3.2* 3.5 3.2*  CL 109 110 110 109 109  CO2 22 22 20* 22 22  GLUCOSE 96 97 97 111* 101*  BUN  7 6 5* 6 6  CREATININE 0.43* 0.43* 0.42* 0.40* 0.39*  CALCIUM  11.8* 11.0* 10.9* 10.9* 11.1*  MG  --   --   --   --  1.5*  PHOS  --  2.0* 2.3*  --   --    GFR: Estimated Creatinine Clearance: 80.3 mL/min (A) (by C-G formula based on SCr of 0.39 mg/dL (L)). Liver Function Tests: Recent Labs  Lab 10/20/24 0817 10/24/24 0507 10/25/24 0416 10/26/24 0502  AST 52*  --   --  34  ALT 11  --   --  6  ALKPHOS 172*  --   --  141*  BILITOT 0.9  --   --  0.8  PROT 8.2*  --   --  7.2  ALBUMIN 3.3* 2.7* 2.9* 2.8*    Anemia Panel: Recent Labs    10/25/24 1053  VITAMINB12 1,022*    Radiology Studies: No results found.    Scheduled Meds:  albuterol   2.5 mg Nebulization TID   calcitonin (salmon)  1 spray Alternating Nares Daily   ferrous sulfate   325 mg Oral Q breakfast   folic acid   1 mg Oral BID   loratadine   10 mg Oral QHS   losartan   25 mg Oral Daily   pantoprazole   40 mg Oral Q0600   thiamine   100 mg Oral Daily   Continuous Infusions:  sodium chloride  75 mL/hr at 10/26/24 1030   magnesium  sulfate bolus IVPB 4 g (10/26/24 1036)   potassium chloride        LOS: 0 days    Joette Pebbles,  [facility] 10/26/2024, 10:41 AM   Available via Epic secure chat 7am-7pm After these hours, please refer to coverage provider listed on [email or web address]  "

## 2024-10-27 ENCOUNTER — Inpatient Hospital Stay (HOSPITAL_COMMUNITY)

## 2024-10-27 DIAGNOSIS — D649 Anemia, unspecified: Secondary | ICD-10-CM | POA: Diagnosis not present

## 2024-10-27 DIAGNOSIS — R0600 Dyspnea, unspecified: Secondary | ICD-10-CM | POA: Diagnosis not present

## 2024-10-27 DIAGNOSIS — C9000 Multiple myeloma not having achieved remission: Secondary | ICD-10-CM | POA: Diagnosis not present

## 2024-10-27 HISTORY — PX: IR BONE MARROW BIOPSY & ASPIRATION: IMG5727

## 2024-10-27 LAB — BASIC METABOLIC PANEL WITH GFR
Anion gap: 12 (ref 5–15)
BUN: 6 mg/dL (ref 6–20)
CO2: 20 mmol/L — ABNORMAL LOW (ref 22–32)
Calcium: 10.4 mg/dL — ABNORMAL HIGH (ref 8.9–10.3)
Chloride: 107 mmol/L (ref 98–111)
Creatinine, Ser: 0.36 mg/dL — ABNORMAL LOW (ref 0.44–1.00)
GFR, Estimated: >60 mL/min
Glucose, Bld: 100 mg/dL — ABNORMAL HIGH (ref 70–99)
Potassium: 3.9 mmol/L (ref 3.5–5.1)
Sodium: 140 mmol/L (ref 135–145)

## 2024-10-27 LAB — CBC WITH DIFFERENTIAL/PLATELET
Abs Immature Granulocytes: 0.93 K/uL — ABNORMAL HIGH (ref 0.00–0.07)
Basophils Absolute: 0.2 K/uL — ABNORMAL HIGH (ref 0.0–0.1)
Basophils Relative: 2 %
Eosinophils Absolute: 0.2 K/uL (ref 0.0–0.5)
Eosinophils Relative: 2 %
HCT: 25.2 % — ABNORMAL LOW (ref 36.0–46.0)
Hemoglobin: 8.1 g/dL — ABNORMAL LOW (ref 12.0–15.0)
Immature Granulocytes: 10 %
Lymphocytes Relative: 36 %
Lymphs Abs: 3.5 K/uL (ref 0.7–4.0)
MCH: 28 pg (ref 26.0–34.0)
MCHC: 32.1 g/dL (ref 30.0–36.0)
MCV: 87.2 fL (ref 80.0–100.0)
Monocytes Absolute: 1.2 K/uL — ABNORMAL HIGH (ref 0.1–1.0)
Monocytes Relative: 12 %
Neutro Abs: 3.9 K/uL (ref 1.7–7.7)
Neutrophils Relative %: 38 %
Platelets: 19 K/uL — CL (ref 150–400)
RBC: 2.89 MIL/uL — ABNORMAL LOW (ref 3.87–5.11)
RDW: 17.5 % — ABNORMAL HIGH (ref 11.5–15.5)
WBC: 9.8 K/uL (ref 4.0–10.5)
nRBC: 12.6 % — ABNORMAL HIGH (ref 0.0–0.2)

## 2024-10-27 LAB — MAGNESIUM: Magnesium: 2.5 mg/dL — ABNORMAL HIGH (ref 1.7–2.4)

## 2024-10-27 MED ORDER — LIDOCAINE HCL (PF) 1 % IJ SOLN
INTRAMUSCULAR | Status: AC
  Filled 2024-10-27: qty 30

## 2024-10-27 MED ORDER — MIDAZOLAM HCL (PF) 2 MG/2ML IJ SOLN
INTRAMUSCULAR | Status: AC | PRN
  Administered 2024-10-27: .5 mg via INTRAVENOUS
  Administered 2024-10-27: 1 mg via INTRAVENOUS

## 2024-10-27 MED ORDER — FENTANYL CITRATE (PF) 100 MCG/2ML IJ SOLN
INTRAMUSCULAR | Status: AC
  Filled 2024-10-27: qty 2

## 2024-10-27 MED ORDER — FUROSEMIDE 10 MG/ML IJ SOLN
40.0000 mg | Freq: Once | INTRAMUSCULAR | Status: AC
  Administered 2024-10-27: 40 mg via INTRAVENOUS
  Filled 2024-10-27: qty 4

## 2024-10-27 MED ORDER — FENTANYL CITRATE (PF) 100 MCG/2ML IJ SOLN
INTRAMUSCULAR | Status: AC | PRN
  Administered 2024-10-27: 50 ug via INTRAVENOUS
  Administered 2024-10-27 (×2): 25 ug via INTRAVENOUS

## 2024-10-27 MED ORDER — ALBUTEROL SULFATE (2.5 MG/3ML) 0.083% IN NEBU
2.5000 mg | INHALATION_SOLUTION | Freq: Four times a day (QID) | RESPIRATORY_TRACT | Status: DC
  Administered 2024-10-27 – 2024-10-28 (×4): 2.5 mg via RESPIRATORY_TRACT
  Filled 2024-10-27 (×8): qty 3

## 2024-10-27 MED ORDER — MIDAZOLAM HCL 2 MG/2ML IJ SOLN
INTRAMUSCULAR | Status: AC
  Filled 2024-10-27: qty 2

## 2024-10-27 MED ORDER — LIDOCAINE HCL (PF) 1 % IJ SOLN
30.0000 mL | Freq: Once | INTRAMUSCULAR | Status: DC
  Filled 2024-10-27: qty 30

## 2024-10-27 NOTE — Progress Notes (Signed)
 CCMD called and advised patient had episode of 3rd degree heart block but was no longer in block. Patient was alert and oriented X4 in no apparent distress. Denied any pain or other complaints. Vitals obtained were stable. Lynwood Kipper, NP notified at [phone]. He advised he would review. No new orders at this time.

## 2024-10-27 NOTE — Progress Notes (Addendum)
 " PROGRESS NOTE    Carla Hanson  FMW:[id number] DOB: Nov 12, 1965 DOA: 10/20/2024 PCP: Anders Otto DASEN, MD     Brief Narrative:  Carla Hanson is an 58 y.o. 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  Was found to have a big bruise on her right upper extremity.  She was transfused 2 unit packed red blood cell 12/17, another 2 unit 12/18.  Subjective: Continues to feel short winded.  She was told about her chest x-ray findings.  She denies any chest pain.  No cough.  No nausea vomiting.  Her mother is at the bedside.    Assessment & Plan:   Multiple myeloma/Symptomatic anemia, thrombocytopenia Patient has been transfused 4 units of PRBC since admission. Medical oncology is following and managing. Transfuse platelets if counts are below 10,000.  Transfuse PRBC if hemoglobin is less than 7. Hemoglobin noted to be 8.1 today and platelet count is 19,000.  Dyspnea Saturation levels are noted to be normal.  She has been complaining of shortness of breath for a few days. Chest x-ray was done yesterday which showed small bilateral pleural effusions.  Nonspecific opacities thought to be atelectasis.  In the absence of a cough fever this is unlikely to be pneumonia.  Will proceed with incentive spirometry.  Mobilization will help.  No indication for antibiotics.  Could consider low-dose furosemide  which might help with the pleural effusion and her symptoms.  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  Unclear if she received any bisphosphonate recently.  Calcium  level stable for the most part.  Continue to monitor.  Stop IV fluids due to concern for vascular congestion.  Hypokalemia/hypomagnesemia Continue to supplement as  indicated.  Essential hypertension Noted to be on losartan .  Monitor blood pressures closely.  History of PE Not on anticoagulant due to history of intracranial hemorrhage.  She is on aspirin  as outpatient, currently holding due to thrombocytopenia   DVT prophylaxis: SCDs Code Status: Full Family Communication: Updated mother at bedside Disposition Plan: SNF is recommended   Objective: Vitals:   10/27/24 1005 10/27/24 1010 10/27/24 1013 10/27/24 1031  BP: (!) 173/93 (!) 158/99 (!) 146/66 120/64  Pulse: (!) 119 (!) 112 (!) 115 (!) 112  Resp: 20 13 15 18   Temp:    98.5 F (36.9 C)  TempSrc:      SpO2: 97% 95% 95% 91%  Weight:      Height:        Intake/Output Summary (Last 24 hours) at 10/27/2024 1032 Last data filed at 10/27/2024 1000 Gross per 24 hour  Intake 898.64 ml  Output 900 ml  Net -1.36 ml   Filed Weights   10/20/24 1548  Weight: 92.6 kg    Examination:   General appearance: Awake alert.  In no distress Resp: Diminished air entry at the bases with no wheezing rales or rhonchi. Cardio: S1-S2 is normal regular.  No S3-S4.  No rubs murmurs or bruit GI: Abdomen is soft.  Nontender nondistended.  Bowel sounds are present normal.  No masses organomegaly Extremities: No edema.  Moving her extremities but physical deconditioning is noted. No focal neurological deficits.  Data Reviewed: I have personally reviewed following labs and reports of imaging studies  CBC: Recent Labs  Lab 10/22/24 1038 10/23/24 0521  10/24/24 0821 10/25/24 0416 10/25/24 1929 10/26/24 0502 10/27/24 0458  WBC 15.3*   < > 14.0* 12.7* 12.6* 11.7* 9.8  NEUTROABS 6.7  --  5.6 4.8  --   --  3.9  HGB 9.2*   < > 8.3* 8.1* 8.3* 7.9* 8.1*  HCT 27.5*   < > 25.2* 24.9* 25.4* 24.7* 25.2*  MCV 84.4   < > 85.7 85.6 85.5 86.4 87.2  PLT 22*   < > 17* 16* 17* 18* 19*   < > = values in this interval not displayed.   Basic Metabolic Panel: Recent Labs  Lab 10/24/24 0507 10/25/24 0416  10/25/24 1929 10/26/24 0502 10/27/24 0458  NA 141 140 140 142 140  K 3.4* 3.2* 3.5 3.2* 3.9  CL 110 110 109 109 107  CO2 22 20* 22 22 20*  GLUCOSE 97 97 111* 101* 100*  BUN 6 5* 6 6 6   CREATININE 0.43* 0.42* 0.40* 0.39* 0.36*  CALCIUM  11.0* 10.9* 10.9* 11.1* 10.4*  MG  --   --   --  1.5* 2.5*  PHOS 2.0* 2.3*  --   --   --    GFR: Estimated Creatinine Clearance: 80.3 mL/min (A) (by C-G formula based on SCr of 0.36 mg/dL (L)). Liver Function Tests: Recent Labs  Lab 10/24/24 0507 10/25/24 0416 10/26/24 0502  AST  --   --  34  ALT  --   --  6  ALKPHOS  --   --  141*  BILITOT  --   --  0.8  PROT  --   --  7.2  ALBUMIN 2.7* 2.9* 2.8*    Anemia Panel: Recent Labs    10/25/24 1053  VITAMINB12 1,022*    Radiology Studies: DG CHEST PORT 1 VIEW Result Date: 10/26/2024 CLINICAL DATA:  Dyspnea. EXAM: PORTABLE CHEST 1 VIEW COMPARISON:  09/08/2024 FINDINGS: The heart is enlarged. Mediastinal contours are stable. Vascular congestion. There are small bilateral pleural effusions. Ill-defined bilateral infrahilar opacities. No pneumothorax. IMPRESSION: 1. Cardiomegaly with vascular congestion and small bilateral pleural effusions. 2. Ill-defined bilateral infrahilar opacities, atelectasis versus pneumonia. Electronically Signed   By: Andrea Gasman M.D.   On: 10/26/2024 16:52      Scheduled Meds:  acyclovir   400 mg Oral BID   albuterol   2.5 mg Nebulization Q6H   calcitonin (salmon)  1 spray Alternating Nares Daily   ferrous sulfate   325 mg Oral Q breakfast   folic acid   1 mg Oral BID   lidocaine  (PF)  30 mL Intradermal Once   loratadine   10 mg Oral QHS   losartan   25 mg Oral Daily   pantoprazole   40 mg Oral Q0600   thiamine   100 mg Oral Daily   Continuous Infusions:  sodium chloride  Stopped (10/26/24 1615)     LOS: 1 day    Joette Pebbles,  [facility] 10/27/2024, 10:32 AM   Available via Epic secure chat 7am-7pm After these hours, please refer to coverage  provider listed on [email or web address]  "

## 2024-10-27 NOTE — Progress Notes (Signed)
 Physical Therapy Treatment Patient Details Name: Carla Hanson MRN: [id number] DOB: [birth year] Today's Date: 10/27/2024   History of Present Illness 58 y.o. female was found in the hematologist office with a hemoglobin of 5.3. 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 Reviewed use  of lift for OOB to chair if needed vs other means (stedy). Pt repeatedly declined stating she was on lasix , I can't, reviewed the ability to have purewick while in chair, pt continued to decline. Pt agreeable to exercises in bed, participated well with LE exercises, UE AROM.  Discussed pt will need to participate with OOB activities next PT session. Patient will benefit from continued inpatient follow up therapy, <3 hours/day    If plan is discharge home, recommend the following: A lot of help with walking and/or transfers;A lot of help with bathing/dressing/bathroom;Assistance with cooking/housework;Assist for transportation;Help with stairs or ramp for entrance   Can travel by private vehicle     No  Equipment Recommendations  None recommended by PT    Recommendations for Other Services       Precautions / Restrictions Precautions Precautions: Fall Recall of Precautions/Restrictions: Intact Precaution/Restrictions Comments: denies h/o falls in past 6 months Restrictions Weight Bearing Restrictions Per Provider Order: No     Mobility  Bed Mobility               General bed mobility comments: pt refused EOB/OOB    Transfers                        Ambulation/Gait                   Stairs             Wheelchair Mobility     Tilt Bed    Modified Rankin (Stroke Patients Only)       Balance                                             Communication Communication Communication: No apparent difficulties  Cognition Arousal: Alert Behavior During Therapy: WFL for tasks assessed/performed   PT - Cognitive impairments: No apparent impairments                         Following commands: Intact      Cueing Cueing Techniques: Verbal cues  Exercises General Exercises - Lower Extremity Ankle Circles/Pumps: AROM, Both, 15 reps Quad Sets: AROM, Both, 10 reps Heel Slides: Both, 10 reps, AROM Hip ABduction/ADduction: AROM, Both, 10 reps    General Comments        Pertinent Vitals/Pain Pain Assessment Pain Assessment: No/denies pain    Home Living                          Prior Function            PT Goals (current goals can now be found in the care plan section) Acute Rehab PT Goals PT Goal Formulation: With patient Time For Goal Achievement: 11/08/24 Potential to Achieve Goals: Good Progress towards PT goals: Not progressing toward  goals - comment (lack of participation)    Frequency    Min 3X/week      PT Plan      Co-evaluation              AM-PAC PT 6 Clicks Mobility   Outcome Measure  Help needed turning from your back to your side while in a flat bed without using bedrails?: A Little Help needed moving from lying on your back to sitting on the side of a flat bed without using bedrails?: A Little Help needed moving to and from a bed to a chair (including a wheelchair)?: A Lot Help needed standing up from a chair using your arms (e.g., wheelchair or bedside chair)?: A Lot Help needed to walk in hospital room?: Total Help needed climbing 3-5 steps with a railing? : Total 6 Click Score: 12    End of Session   Activity Tolerance: Other (comment) (self limiting) Patient left: in bed;with bed alarm set;with call bell/phone within reach Nurse Communication: Mobility status PT Visit Diagnosis: Difficulty in walking, not elsewhere classified  (R26.2);Other abnormalities of gait and mobility (R26.89);Muscle weakness (generalized) (M62.81)     Time: 8878-8864 PT Time Calculation (min) (ACUTE ONLY): 14 min  Charges:    $Therapeutic Exercise: 8-22 mins PT General Charges $$ ACUTE PT VISIT: 1 Visit                     Elnor Renovato, PT  Acute Rehab Dept [facility]) [phone]  10/27/2024    [facility] 10/27/2024, 2:01 PM

## 2024-10-27 NOTE — Plan of Care (Signed)
  Problem: Pain Managment: Goal: General experience of comfort will improve and/or be controlled Outcome: Progressing   Problem: Safety: Goal: Ability to remain free from injury will improve Outcome: Progressing

## 2024-10-27 NOTE — Procedures (Signed)
 Interventional Radiology Procedure Note  Procedure: fluoro RT ILIAC BM ASP AND CORE    Complications: None  Estimated Blood Loss:  MIN  Findings: 11 G CORE AND ASP    M. TREVOR Deshana Rominger, MD

## 2024-10-28 DIAGNOSIS — C9000 Multiple myeloma not having achieved remission: Secondary | ICD-10-CM | POA: Diagnosis not present

## 2024-10-28 DIAGNOSIS — R0600 Dyspnea, unspecified: Secondary | ICD-10-CM | POA: Diagnosis not present

## 2024-10-28 DIAGNOSIS — D649 Anemia, unspecified: Secondary | ICD-10-CM | POA: Diagnosis not present

## 2024-10-28 LAB — CBC
HCT: 24.9 % — ABNORMAL LOW (ref 36.0–46.0)
Hemoglobin: 7.9 g/dL — ABNORMAL LOW (ref 12.0–15.0)
MCH: 27.7 pg (ref 26.0–34.0)
MCHC: 31.7 g/dL (ref 30.0–36.0)
MCV: 87.4 fL (ref 80.0–100.0)
Platelets: 18 K/uL — CL (ref 150–400)
RBC: 2.85 MIL/uL — ABNORMAL LOW (ref 3.87–5.11)
RDW: 17.3 % — ABNORMAL HIGH (ref 11.5–15.5)
WBC: 10.2 K/uL (ref 4.0–10.5)
nRBC: 10.5 % — ABNORMAL HIGH (ref 0.0–0.2)

## 2024-10-28 LAB — MAGNESIUM: Magnesium: 2.1 mg/dL (ref 1.7–2.4)

## 2024-10-28 LAB — BASIC METABOLIC PANEL WITH GFR
Anion gap: 12 (ref 5–15)
BUN: 8 mg/dL (ref 6–20)
CO2: 24 mmol/L (ref 22–32)
Calcium: 9.9 mg/dL (ref 8.9–10.3)
Chloride: 106 mmol/L (ref 98–111)
Creatinine, Ser: 0.41 mg/dL — ABNORMAL LOW (ref 0.44–1.00)
GFR, Estimated: >60 mL/min
Glucose, Bld: 97 mg/dL (ref 70–99)
Potassium: 2.9 mmol/L — ABNORMAL LOW (ref 3.5–5.1)
Sodium: 143 mmol/L (ref 135–145)

## 2024-10-28 MED ORDER — ALBUTEROL SULFATE (2.5 MG/3ML) 0.083% IN NEBU
2.5000 mg | INHALATION_SOLUTION | Freq: Three times a day (TID) | RESPIRATORY_TRACT | Status: DC
  Administered 2024-10-29: 2.5 mg via RESPIRATORY_TRACT
  Filled 2024-10-28: qty 3

## 2024-10-28 MED ORDER — POTASSIUM CHLORIDE CRYS ER 20 MEQ PO TBCR
40.0000 meq | EXTENDED_RELEASE_TABLET | ORAL | Status: AC
  Administered 2024-10-28 (×3): 40 meq via ORAL
  Filled 2024-10-28 (×3): qty 2

## 2024-10-28 MED ORDER — POTASSIUM CHLORIDE CRYS ER 20 MEQ PO TBCR
40.0000 meq | EXTENDED_RELEASE_TABLET | Freq: Two times a day (BID) | ORAL | Status: DC
  Administered 2024-10-29 – 2024-11-05 (×15): 40 meq via ORAL
  Filled 2024-10-28 (×6): qty 2

## 2024-10-28 MED ORDER — FUROSEMIDE 10 MG/ML IJ SOLN
40.0000 mg | Freq: Once | INTRAMUSCULAR | Status: AC
  Administered 2024-10-28: 40 mg via INTRAVENOUS
  Filled 2024-10-28: qty 4

## 2024-10-28 NOTE — Plan of Care (Signed)
" °  Problem: Activity: Goal: Risk for activity intolerance will decrease Outcome: Progressing   Problem: Pain Managment: Goal: General experience of comfort will improve and/or be controlled Outcome: Progressing   Problem: Activity: Goal: Risk for activity intolerance will decrease Outcome: Progressing   "

## 2024-10-28 NOTE — TOC Progression Note (Addendum)
 Transition of Care [facility]) - Progression Note    Patient Details  Name: Carla Hanson MRN: [id number] Date of Birth: February 15, 1966  [facility]) CM/SW Contact  Lorraine LILLETTE Fenton, LCSW Phone Number: 10/28/2024, 8:30 AM  Clinical Narrative:    CSW reviewed SNF bed requests, several denied and at least 2 considering.  One facility questioned cancer treatment follow up.  Secure chat sent to MD.  ICM following.  Addendum: Dr stated Cancer MD would have to answer- not signed in today.  Expected Discharge Plan: Skilled Nursing Facility (vs. home with [facility]) Barriers to Discharge: Continued Medical Work up               Expected Discharge Plan and Services In-house Referral: Clinical Social Work   Post Acute Care Choice: Skilled Nursing Facility Living arrangements for the past 2 months: Apartment                 DME Arranged: N/A DME Agency: NA                   Social Drivers of Health (SDOH) Interventions SDOH Screenings   Food Insecurity: No Food Insecurity (10/20/2024)  Housing: Low Risk (10/20/2024)  Transportation Needs: No Transportation Needs (10/20/2024)  Utilities: Not At Risk (10/20/2024)  Depression (PHQ2-9): Low Risk (09/08/2024)  Financial Resource Strain: Low Risk (05/14/2024)  Physical Activity: Insufficiently Active (05/14/2024)  Social Connections: Moderately Integrated (10/20/2024)  Stress: No Stress Concern Present (05/14/2024)  Tobacco Use: Medium Risk (10/20/2024)    Readmission Risk Interventions    10/26/2024    2:05 PM 02/03/2024    1:14 PM  Readmission Risk Prevention Plan  Transportation Screening Complete Complete  PCP or Specialist Appt within 3-5 Days Complete Complete  HRI or Home Care Consult Complete   Social Work Consult for Recovery Care Planning/Counseling Complete Complete  Palliative Care Screening  Not Applicable  Medication Review Ocean[employer]) Complete Complete

## 2024-10-28 NOTE — Progress Notes (Signed)
 CCMD called and advised of more episodes of 3rd degree heart block. Patient only complains of waking up short of breath earlier. No current complaints. Lynwood Kipper, NP notified of situation. He advised he had ordered a magnesium  lab after the first notification. No other orders at this time.

## 2024-10-28 NOTE — Plan of Care (Signed)
" °  Problem: Education: Goal: Knowledge of General Education information will improve Description: Including pain rating scale, medication(s)/side effects and non-pharmacologic comfort measures Outcome: Progressing   Problem: Pain Managment: Goal: General experience of comfort will improve and/or be controlled Outcome: Progressing   Problem: Education: Goal: Knowledge of General Education information will improve Description: Including pain rating scale, medication(s)/side effects and non-pharmacologic comfort measures Outcome: Progressing   "

## 2024-10-28 NOTE — Progress Notes (Signed)
 " PROGRESS NOTE    Carla Hanson  FMW:[id number] DOB: 1966-02-01 DOA: 10/20/2024 PCP: Anders Otto DASEN, MD     Brief Narrative:  Carla Hanson is an 58 y.o. 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  Was found to have a big bruise on her right upper extremity.  She was transfused 2 unit packed red blood cell 12/17, another 2 unit 12/18.  Subjective: Patient mentioned that she is feeling slightly better today compared to yesterday.  Less short of breath.  No cough.     Assessment & Plan:   Multiple myeloma/Symptomatic anemia, thrombocytopenia Patient has been transfused 4 units of PRBC since admission. Medical oncology is following and managing. Transfuse platelets if counts are below 10,000.  Transfuse PRBC if hemoglobin is less than 7. Labs being checked daily.  Hemoglobin is 7.9 today and platelets are 18,000.  Dyspnea Saturation levels are noted to be normal.  She has been complaining of shortness of breath for a few days. Chest x-ray was done which showed small bilateral pleural effusions.  Nonspecific opacities thought to be atelectasis.  In the absence of a cough fever this is unlikely to be pneumonia.   Patient was given furosemide  yesterday which appears to have helped.  Will give additional dose today.  Continue with incentive spirometry.  No clear indication for antibiotics.    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  Unclear if she received any bisphosphonate recently.  Calcium  level stable for the most part.  Continue to monitor.    Hypokalemia/hypomagnesemia Continue to supplement as indicated.  Essential hypertension Noted to be on losartan .  Monitor blood pressures closely.  History of PE Not on  anticoagulant due to history of intracranial hemorrhage.  She is on aspirin  as outpatient, currently holding due to thrombocytopenia   DVT prophylaxis: SCDs Code Status: Full Family Communication: Discussed with patient.  No family at bedside today. Disposition Plan: SNF is recommended   Objective: Vitals:   10/27/24 2016 10/28/24 0021 10/28/24 0602 10/28/24 0801  BP: 115/64 (!) 154/66 (!) 142/68   Pulse: (!) 108 (!) 104 (!) 106   Resp: 17 17 17    Temp: 98.4 F (36.9 C) 98.4 F (36.9 C) 98.5 F (36.9 C)   TempSrc: Oral Oral Oral   SpO2: 94% 93% 91% 94%  Weight:      Height:        Intake/Output Summary (Last 24 hours) at 10/28/2024 1001 Last data filed at 10/28/2024 0600 Gross per 24 hour  Intake 180 ml  Output 2500 ml  Net -2320 ml   Filed Weights   10/20/24 1548  Weight: 92.6 kg    Examination:   General appearance: Awake alert.  In no distress Resp: Clear to auscultation bilaterally.  Normal effort Cardio: S1-S2 is normal regular.  No S3-S4.  No rubs murmurs or bruit GI: Abdomen is soft.  Nontender nondistended.  Bowel sounds are present normal.  No masses organomegaly Extremities: Physical deconditioning noted. No obvious focal neurological deficits.  Data Reviewed: I have personally reviewed following labs and reports of imaging studies  CBC: Recent Labs  Lab 10/22/24 1038 10/23/24 0521 10/24/24 0821 10/25/24 0416 10/25/24 1929 10/26/24 0502 10/27/24 0458 10/28/24 0500  WBC 15.3*   < > 14.0* 12.7* 12.6*  11.7* 9.8 10.2  NEUTROABS 6.7  --  5.6 4.8  --   --  3.9  --   HGB 9.2*   < > 8.3* 8.1* 8.3* 7.9* 8.1* 7.9*  HCT 27.5*   < > 25.2* 24.9* 25.4* 24.7* 25.2* 24.9*  MCV 84.4   < > 85.7 85.6 85.5 86.4 87.2 87.4  PLT 22*   < > 17* 16* 17* 18* 19* 18*   < > = values in this interval not displayed.   Basic Metabolic Panel: Recent Labs  Lab 10/24/24 0507 10/25/24 0416 10/25/24 1929 10/26/24 0502 10/27/24 0458 10/28/24 0500  NA 141 140 140 142  140 143  K 3.4* 3.2* 3.5 3.2* 3.9 2.9*  CL 110 110 109 109 107 106  CO2 22 20* 22 22 20* 24  GLUCOSE 97 97 111* 101* 100* 97  BUN 6 5* 6 6 6 8   CREATININE 0.43* 0.42* 0.40* 0.39* 0.36* 0.41*  CALCIUM  11.0* 10.9* 10.9* 11.1* 10.4* 9.9  MG  --   --   --  1.5* 2.5* 2.1  PHOS 2.0* 2.3*  --   --   --   --    GFR: Estimated Creatinine Clearance: 80.3 mL/min (A) (by C-G formula based on SCr of 0.41 mg/dL (L)).   Liver Function Tests: Recent Labs  Lab 10/24/24 0507 10/25/24 0416 10/26/24 0502  AST  --   --  34  ALT  --   --  6  ALKPHOS  --   --  141*  BILITOT  --   --  0.8  PROT  --   --  7.2  ALBUMIN 2.7* 2.9* 2.8*    Anemia Panel: Recent Labs    10/25/24 1053  VITAMINB12 1,022*    Radiology Studies: IR BONE MARROW BIOPSY & ASPIRATION Result Date: 10/27/2024 INDICATION: Myeloma, reassess marrow status EXAM: FLUOROSCOPICALLY GUIDED RIGHT ILIAC BONE MARROW ASPIRATION AND CORE BIOPSY Date:  10/27/2024 10/27/2024 10:19 am Radiologist:  M. Frederic Specking, MD Guidance: Fluoroscopy FLUOROSCOPY: Fluoroscopy Time:  (56 mGy). MEDICATIONS: 1% lidocaine  local ANESTHESIA/SEDATION: 1.5 mg IV Versed ; 100 mcg IV Fentanyl  Moderate Sedation Time:  11 minutes The patient was continuously monitored during the procedure by the interventional radiology nurse under my direct supervision. CONTRAST:  None. COMPLICATIONS: None immediate PROCEDURE: Informed consent was obtained from the patient following explanation of the procedure, risks, benefits and alternatives. The patient understands, agrees and consents for the procedure. All questions were addressed. A time out was performed. The patient was positioned prone and fluoroscopic localization was performed of the pelvis to demonstrate the iliac marrow spaces. Maximal barrier sterile technique utilized including caps, mask, sterile gowns, sterile gloves, large sterile drape, hand hygiene, and Betadine prep. Under sterile conditions and local anesthesia, an 11  gauge coaxial bone biopsy needle was advanced into the right iliac marrow space. Needle position was confirmed with fluoroscopic imaging. Initially, bone marrow aspiration was performed. Next, the 11 gauge outer cannula was utilized to obtain a right iliac bone marrow core biopsy. Needle was removed. Hemostasis was obtained with compression. The patient tolerated the procedure well. Samples were prepared with the cytotechnologist. No immediate complications. IMPRESSION: Fluoroscopically guided right iliac bone marrow aspiration and core biopsy. Electronically Signed   By: CHRISTELLA.  Shick M.D.   On: 10/27/2024 10:31   DG CHEST PORT 1 VIEW Result Date: 10/26/2024 CLINICAL DATA:  Dyspnea. EXAM: PORTABLE CHEST 1 VIEW COMPARISON:  09/08/2024 FINDINGS: The heart is enlarged. Mediastinal contours are stable. Vascular congestion. There are  small bilateral pleural effusions. Ill-defined bilateral infrahilar opacities. No pneumothorax. IMPRESSION: 1. Cardiomegaly with vascular congestion and small bilateral pleural effusions. 2. Ill-defined bilateral infrahilar opacities, atelectasis versus pneumonia. Electronically Signed   By: Andrea Gasman M.D.   On: 10/26/2024 16:52      Scheduled Meds:  acyclovir   400 mg Oral BID   albuterol   2.5 mg Nebulization Q6H   calcitonin (salmon)  1 spray Alternating Nares Daily   ferrous sulfate   325 mg Oral Q breakfast   folic acid   1 mg Oral BID   furosemide   40 mg Intravenous Once   lidocaine  (PF)  30 mL Intradermal Once   loratadine   10 mg Oral QHS   losartan   25 mg Oral Daily   pantoprazole   40 mg Oral Q0600   potassium chloride   40 mEq Oral Q4H   [START ON 10/29/2024] potassium chloride   40 mEq Oral BID   thiamine   100 mg Oral Daily   Continuous Infusions:     LOS: 2 days    Joette Pebbles,  [facility] 10/28/2024, 10:01 AM   Available via Epic secure chat 7am-7pm After these hours, please refer to coverage provider listed on [email or web address]  "

## 2024-10-29 ENCOUNTER — Other Ambulatory Visit (HOSPITAL_COMMUNITY): Payer: Self-pay

## 2024-10-29 DIAGNOSIS — E66812 Obesity, class 2: Secondary | ICD-10-CM | POA: Diagnosis not present

## 2024-10-29 DIAGNOSIS — R0600 Dyspnea, unspecified: Secondary | ICD-10-CM | POA: Diagnosis not present

## 2024-10-29 DIAGNOSIS — C9000 Multiple myeloma not having achieved remission: Secondary | ICD-10-CM | POA: Diagnosis not present

## 2024-10-29 DIAGNOSIS — I4892 Unspecified atrial flutter: Secondary | ICD-10-CM

## 2024-10-29 DIAGNOSIS — I483 Typical atrial flutter: Secondary | ICD-10-CM

## 2024-10-29 DIAGNOSIS — D6481 Anemia due to antineoplastic chemotherapy: Secondary | ICD-10-CM | POA: Diagnosis not present

## 2024-10-29 DIAGNOSIS — E785 Hyperlipidemia, unspecified: Secondary | ICD-10-CM | POA: Diagnosis not present

## 2024-10-29 DIAGNOSIS — T451X5A Adverse effect of antineoplastic and immunosuppressive drugs, initial encounter: Secondary | ICD-10-CM | POA: Diagnosis not present

## 2024-10-29 DIAGNOSIS — D61818 Other pancytopenia: Secondary | ICD-10-CM | POA: Diagnosis not present

## 2024-10-29 DIAGNOSIS — D509 Iron deficiency anemia, unspecified: Secondary | ICD-10-CM | POA: Diagnosis not present

## 2024-10-29 DIAGNOSIS — I119 Hypertensive heart disease without heart failure: Secondary | ICD-10-CM | POA: Diagnosis not present

## 2024-10-29 DIAGNOSIS — D6959 Other secondary thrombocytopenia: Secondary | ICD-10-CM | POA: Diagnosis not present

## 2024-10-29 DIAGNOSIS — J90 Pleural effusion, not elsewhere classified: Secondary | ICD-10-CM | POA: Diagnosis not present

## 2024-10-29 DIAGNOSIS — D649 Anemia, unspecified: Secondary | ICD-10-CM | POA: Diagnosis not present

## 2024-10-29 LAB — BASIC METABOLIC PANEL WITH GFR
Anion gap: 12 (ref 5–15)
BUN: 9 mg/dL (ref 6–20)
CO2: 25 mmol/L (ref 22–32)
Calcium: 10.1 mg/dL (ref 8.9–10.3)
Chloride: 106 mmol/L (ref 98–111)
Creatinine, Ser: 0.46 mg/dL (ref 0.44–1.00)
GFR, Estimated: >60 mL/min
Glucose, Bld: 110 mg/dL — ABNORMAL HIGH (ref 70–99)
Potassium: 3.7 mmol/L (ref 3.5–5.1)
Sodium: 142 mmol/L (ref 135–145)

## 2024-10-29 LAB — CBC
HCT: 28.4 % — ABNORMAL LOW (ref 36.0–46.0)
Hemoglobin: 9 g/dL — ABNORMAL LOW (ref 12.0–15.0)
MCH: 27.8 pg (ref 26.0–34.0)
MCHC: 31.7 g/dL (ref 30.0–36.0)
MCV: 87.7 fL (ref 80.0–100.0)
Platelets: 23 K/uL — CL (ref 150–400)
RBC: 3.24 MIL/uL — ABNORMAL LOW (ref 3.87–5.11)
RDW: 17.2 % — ABNORMAL HIGH (ref 11.5–15.5)
WBC: 9.9 K/uL (ref 4.0–10.5)
nRBC: 10.3 % — ABNORMAL HIGH (ref 0.0–0.2)

## 2024-10-29 LAB — SURGICAL PATHOLOGY

## 2024-10-29 LAB — TSH: TSH: 1.51 u[IU]/mL (ref 0.350–4.500)

## 2024-10-29 LAB — MAGNESIUM: Magnesium: 2.3 mg/dL (ref 1.7–2.4)

## 2024-10-29 MED ORDER — ALPRAZOLAM 0.25 MG PO TABS: 0.2500 mg | ORAL_TABLET | Freq: Three times a day (TID) | ORAL | Status: DC | PRN

## 2024-10-29 MED ORDER — AMIODARONE HCL IN DEXTROSE 360-4.14 MG/200ML-% IV SOLN
60.0000 mg/h | INTRAVENOUS | Status: AC
  Administered 2024-10-29: 60 mg/h via INTRAVENOUS
  Filled 2024-10-29: qty 200

## 2024-10-29 MED ORDER — DIGOXIN 0.25 MG/ML IJ SOLN
0.5000 mg | Freq: Once | INTRAMUSCULAR | Status: AC
  Administered 2024-10-29: 0.5 mg via INTRAVENOUS
  Filled 2024-10-29: qty 2

## 2024-10-29 MED ORDER — BUDESON-GLYCOPYRROL-FORMOTEROL 160-9-4.8 MCG/ACT IN AERO
2.0000 | INHALATION_SPRAY | Freq: Two times a day (BID) | RESPIRATORY_TRACT | Status: DC
  Administered 2024-10-30 – 2024-11-05 (×8): 2 via RESPIRATORY_TRACT
  Filled 2024-10-29: qty 5.9

## 2024-10-29 MED ORDER — AMIODARONE HCL IN DEXTROSE 360-4.14 MG/200ML-% IV SOLN
30.0000 mg/h | INTRAVENOUS | Status: DC
  Administered 2024-10-29 – 2024-10-31 (×4): 30 mg/h via INTRAVENOUS
  Filled 2024-10-29 (×5): qty 200

## 2024-10-29 MED ORDER — AMIODARONE LOAD VIA INFUSION
150.0000 mg | Freq: Once | INTRAVENOUS | Status: AC
  Administered 2024-10-29: 150 mg via INTRAVENOUS
  Filled 2024-10-29: qty 83.34

## 2024-10-29 MED ORDER — METOPROLOL TARTRATE 5 MG/5ML IV SOLN
2.5000 mg | Freq: Once | INTRAVENOUS | Status: AC | PRN
  Administered 2024-10-29: 2.5 mg via INTRAVENOUS
  Filled 2024-10-29: qty 5

## 2024-10-29 MED ORDER — METOPROLOL TARTRATE 25 MG PO TABS
25.0000 mg | ORAL_TABLET | Freq: Four times a day (QID) | ORAL | Status: DC | PRN
  Administered 2024-10-29: 25 mg via ORAL
  Filled 2024-10-29: qty 1

## 2024-10-29 MED ORDER — SODIUM CHLORIDE 0.9 % IV BOLUS
250.0000 mL | Freq: Once | INTRAVENOUS | Status: AC
  Administered 2024-10-29: 250 mL via INTRAVENOUS

## 2024-10-29 MED ORDER — DILTIAZEM HCL-DEXTROSE 125-5 MG/125ML-% IV SOLN (PREMIX)
5.0000 mg/h | INTRAVENOUS | Status: DC
  Administered 2024-10-29: 15 mg/h via INTRAVENOUS
  Administered 2024-10-29: 5 mg/h via INTRAVENOUS
  Administered 2024-10-30 – 2024-10-31 (×4): 15 mg/h via INTRAVENOUS
  Filled 2024-10-29 (×6): qty 125

## 2024-10-29 MED ORDER — METOPROLOL TARTRATE 5 MG/5ML IV SOLN
2.5000 mg | Freq: Once | INTRAVENOUS | Status: AC | PRN
  Administered 2024-10-29: 2.5 mg via INTRAVENOUS

## 2024-10-29 MED FILL — Dexamethasone Sodium Phosphate Inj 100 MG/10ML: INTRAMUSCULAR | Qty: 2 | Status: AC

## 2024-10-29 NOTE — Consult Note (Signed)
 CARDIOLOGY CONSULT NOTE       Patient ID: Carla Hanson MRN: [id number] DOB/AGE: December 18, 1965 58 y.o.  Admit date: 10/20/2024 Referring Physician: Verdene Primary Physician: Anders Otto DASEN, MD Primary Cardiologist: New Reason for Consultation: Aflutter  Principal Problem:   Symptomatic anemia Active Problems:   Essential hypertension   History of pulmonary embolism   Iron deficiency anemia   Anemia   Hypercalcemia of malignancy   Multiple myeloma not having achieved remission (HCC)   Folate deficiency   Anemia due to antineoplastic agent   Counseling regarding advance care planning and goals of care   Thrombocytopenia   HPI:  58 y.o. admitted 12/17 with weakness and Hb 5.3.  She has history of DVT, ICH, HTN, HLD, COPD and multiple myeloma getting chemo. Hct still low today with Hb 9.0 She also has marked thrombocytopenia with PLT 23. This am went into rapid atrial flutter rates 130-146 bpm. She is unaware of rhythm. No dyspnea, chest pain or palpitations  CXR with CE vascular congestion small bilateral effusions and infrahilar opacities ? Pneumonia. Rx by hospitalist with 2.5 mg iv lopressor  x 2 and 250 cc saline bolus. Fellow last night suggested lopressor  25 mg q 6 hours She has received a total of 4 units of PRBC;s last on 12/18. She has a large bruise on her RUE Has had a dose of lasix  for dyspnea improved   ROS All other systems reviewed and negative except as noted above  Past Medical History:  Diagnosis Date   Alcohol  abuse    Anxiety    Back pain    Bipolar disorder (HCC)    Bronchitis    COPD exacerbation (HCC) 05/10/2016   Drug use    Edema 02/24/2024   History of blood clots    Hyperlipidemia    Hypertension    Influenza A 12/01/2018   Joint pain    Left leg DVT (HCC) 09/29/2013   Lipoma    Abdomen   LIPOMA 01/20/2008   Obesity    Occasional tremors 05/12/2019   Osteoarthritis of right knee    Other fatigue    Painful menstrual periods  01/05/2012   Pneumonia    Prediabetes    Seizure (HCC)    Shortness of breath on exertion    Stroke (HCC)    Tobacco abuse     Family History  Problem Relation Age of Onset   Other Mother        High blood pressure runs in the family   Obesity Mother    Asthma Father    Heart attack Father    Obesity Father    Diabetes Maternal Aunt    Breast cancer Neg Hx     Social History   Socioeconomic History   Marital status: Divorced    Spouse name: Not on file   Number of children: 4   Years of education: 10   Highest education level: Not on file  Occupational History   Occupation: stay at home  Tobacco Use   Smoking status: Former    Current packs/day: 0.00    Average packs/day: 0.5 packs/day for 35.0 years (17.5 ttl pk-yrs)    Types: Cigarettes    Start date: 11/04/1981    Quit date: 10/2016    Years since quitting: 8.0   Smokeless tobacco: Never  Vaping Use   Vaping status: Never Used  Substance and Sexual Activity   Alcohol  use: No    Alcohol /week: 0.0 standard drinks of alcohol   Comment: Quit 10/2016   Drug use: Yes    Types: Marijuana   Sexual activity: Not Currently    Comment: tubal  Other Topics Concern   Not on file  Social History Narrative   Unemployed, 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  Considering nursing.    Lives at home alone   Right-handed   Caffeine: not much   Social Drivers of Health   Tobacco Use: Medium Risk (10/20/2024)   Patient History    Smoking Tobacco Use: Former    Smokeless Tobacco Use: Never    Passive Exposure: Not on file  Financial Resource Strain: Low Risk (05/14/2024)   Overall Financial Resource Strain (CARDIA)    Difficulty of Paying Living Expenses: Not hard at all  Food Insecurity: No Food Insecurity (10/20/2024)   Epic    Worried About Programme Researcher, Broadcasting/film/video in the Last Year: Never true    Ran Out of Food in the Last Year: Never true   Transportation Needs: No Transportation Needs (10/20/2024)   Epic    Lack of Transportation (Medical): No    Lack of Transportation (Non-Medical): No  Physical Activity: Insufficiently Active (05/14/2024)   Exercise Vital Sign    Days of Exercise per Week: 3 days    Minutes of Exercise per Session: 20 min  Stress: No Stress Concern Present (05/14/2024)   [employer] of Occupational Health - Occupational Stress Questionnaire    Feeling of Stress: Not at all  Social Connections: Moderately Integrated (10/20/2024)   Social Connection and Isolation Panel    Frequency of Communication with Friends and Family: More than three times a week    Frequency of Social Gatherings with Friends and Family: More than three times a week    Attends Religious Services: More than 4 times per year    Active Member of Clubs or Organizations: Yes    Attends Banker Meetings: More than 4 times per year    Marital Status: Divorced  Intimate Partner Violence: Not At Risk (10/20/2024)   Epic    Fear of Current or Ex-Partner: No    Emotionally Abused: No    Physically Abused: No    Sexually Abused: No  Depression (PHQ2-9): Low Risk (09/08/2024)   Depression (PHQ2-9)    PHQ-2 Score: 1  Alcohol  Screen: Not on file  Housing: Low Risk (10/20/2024)   Epic    Unable to Pay for Housing in the Last Year: No    Number of Times Moved in the Last Year: 0    Homeless in the Last Year: No  Utilities: Not At Risk (10/20/2024)   Epic    Threatened with loss of utilities: No  Health Literacy: Not on file    Past Surgical History:  Procedure Laterality Date   CESAREAN SECTION     CYSTECTOMY     IR BONE MARROW BIOPSY & ASPIRATION  10/27/2024   LIPOMA EXCISION  03/2011   ORIF ANKLE FRACTURE  03/13/2012   Procedure: OPEN REDUCTION INTERNAL FIXATION (ORIF) ANKLE FRACTURE;  Surgeon: Lynwood FORBES Better, MD;  Location: [facility];  Service: Orthopedics;  Laterality: Right;   TUBAL LIGATION       Current  Medications[1]  acyclovir   400 mg Oral BID   calcitonin (salmon)  1 spray Alternating Nares Daily   ferrous sulfate   325 mg Oral Q breakfast   folic acid   1 mg Oral BID  lidocaine  (PF)  30 mL Intradermal Once   loratadine   10 mg Oral QHS   losartan   25 mg Oral Daily   pantoprazole   40 mg Oral Q0600   potassium chloride   40 mEq Oral BID   thiamine   100 mg Oral Daily    diltiazem  (CARDIZEM ) infusion      Physical Exam: Blood pressure 122/72, pulse (!) 162, temperature 98.5 F (36.9 C), temperature source Oral, resp. rate 18, height 5' 1.5 (1.562 m), weight 92.6 kg, SpO2 92%.   Chronically ill black female Large bruise in RUE Decreased BS bilateral base SEM No significant edema Abdomen benign  Labs:   Lab Results  Component Value Date   WBC 9.9 10/29/2024   HGB 9.0 (L) 10/29/2024   HCT 28.4 (L) 10/29/2024   MCV 87.7 10/29/2024   PLT 23 (LL) 10/29/2024    Recent Labs  Lab 10/26/24 0502 10/27/24 0458 10/29/24 0434  NA 142   < > 142  K 3.2*   < > 3.7  CL 109   < > 106  CO2 22   < > 25  BUN 6   < > 9  CREATININE 0.39*   < > 0.46  CALCIUM  11.1*   < > 10.1  PROT 7.2  --   --   BILITOT 0.8  --   --   ALKPHOS 141*  --   --   ALT 6  --   --   AST 34  --   --   GLUCOSE 101*   < > 110*   < > = values in this interval not displayed.   Lab Results  Component Value Date   CKTOTAL 93 01/29/2024   TROPONINI <0.03 10/31/2016    Lab Results  Component Value Date   CHOL 143 02/10/2024   CHOL 138 03/25/2023   CHOL 145 12/18/2022   Lab Results  Component Value Date   HDL 45 02/10/2024   HDL 44 03/25/2023   HDL 50 12/18/2022   Lab Results  Component Value Date   LDLCALC 77 02/10/2024   LDLCALC 72 03/25/2023   LDLCALC 79 12/18/2022   Lab Results  Component Value Date   TRIG 113 02/10/2024   TRIG 121 03/25/2023   TRIG 83 12/18/2022   Lab Results  Component Value Date   CHOLHDL 3.2 02/10/2024   CHOLHDL 5.7 (H) 05/15/2021   CHOLHDL 5.4 (H) 12/19/2020    No results found for: LDLDIRECT    Radiology: IR BONE MARROW BIOPSY & ASPIRATION Result Date: 10/27/2024 INDICATION: Myeloma, reassess marrow status EXAM: FLUOROSCOPICALLY GUIDED RIGHT ILIAC BONE MARROW ASPIRATION AND CORE BIOPSY Date:  10/27/2024 10/27/2024 10:19 am Radiologist:  M. Frederic Specking, MD Guidance: Fluoroscopy FLUOROSCOPY: Fluoroscopy Time:  (56 mGy). MEDICATIONS: 1% lidocaine  local ANESTHESIA/SEDATION: 1.5 mg IV Versed ; 100 mcg IV Fentanyl  Moderate Sedation Time:  11 minutes The patient was continuously monitored during the procedure by the interventional radiology nurse under my direct supervision. CONTRAST:  None. COMPLICATIONS: None immediate PROCEDURE: Informed consent was obtained from the patient following explanation of the procedure, risks, benefits and alternatives. The patient understands, agrees and consents for the procedure. All questions were addressed. A time out was performed. The patient was positioned prone and fluoroscopic localization was performed of the pelvis to demonstrate the iliac marrow spaces. Maximal barrier sterile technique utilized including caps, mask, sterile gowns, sterile gloves, large sterile drape, hand hygiene, and Betadine prep. Under sterile conditions and local anesthesia, an 11 gauge coaxial bone biopsy  needle was advanced into the right iliac marrow space. Needle position was confirmed with fluoroscopic imaging. Initially, bone marrow aspiration was performed. Next, the 11 gauge outer cannula was utilized to obtain a right iliac bone marrow core biopsy. Needle was removed. Hemostasis was obtained with compression. The patient tolerated the procedure well. Samples were prepared with the cytotechnologist. No immediate complications. IMPRESSION: Fluoroscopically guided right iliac bone marrow aspiration and core biopsy. Electronically Signed   By: CHRISTELLA.  Shick M.D.   On: 10/27/2024 10:31   DG CHEST PORT 1 VIEW Result Date: 10/26/2024 CLINICAL DATA:   Dyspnea. EXAM: PORTABLE CHEST 1 VIEW COMPARISON:  09/08/2024 FINDINGS: The heart is enlarged. Mediastinal contours are stable. Vascular congestion. There are small bilateral pleural effusions. Ill-defined bilateral infrahilar opacities. No pneumothorax. IMPRESSION: 1. Cardiomegaly with vascular congestion and small bilateral pleural effusions. 2. Ill-defined bilateral infrahilar opacities, atelectasis versus pneumonia. Electronically Signed   By: Andrea Gasman M.D.   On: 10/26/2024 16:52    EKG: atrial flutter    ASSESSMENT AND PLAN:   Atrial flutter:  in cancer patient with severe anemia will be very hard to control Onset this am. Ok to use iv amiodarone  for 48 hours to try and convert. Will need transfer to telemetry floor. Continue oral lopressor  Will need better rate control before checking TTE over the weekend. Check TSH/T4  Keep Hb > 10 No anticoagulation with anemia, severe thrombocytopenia and history of ICH Anemia:  from myeloma, chemo with associated thrombocytopenia Transfuse per primary service Has large bruise on RUE HTN:  discontinue Cozaar  to make room for AV nodal blocking drugs   Signed: Maude Emmer 10/29/2024, 9:45 AM      [1]  Current Facility-Administered Medications:    acetaminophen  (TYLENOL ) tablet 650 mg, 650 mg, Oral, Q6H PRN, 650 mg at 10/22/24 0601 **OR** acetaminophen  (TYLENOL ) suppository 650 mg, 650 mg, Rectal, Q6H PRN, Zella, Mir M, MD   acyclovir  (ZOVIRAX ) tablet 400 mg, 400 mg, Oral, BID, Krishnan, Gokul, MD, 400 mg at 10/28/24 2141   ALPRAZolam  (XANAX ) tablet 0.25 mg, 0.25 mg, Oral, TID PRN, Krishnan, Gokul, MD   artificial tears ophthalmic solution 1 drop, 1 drop, Both Eyes, TID PRN, Zella, Mir M, MD   calcitonin (salmon) (MIACALCIN MARCIANA) nasal spray 1 spray, 1 spray, Alternating Nares, Daily, Onesimo Emaline Brink, MD, 1 spray at 10/28/24 1022   diltiazem  (CARDIZEM ) 125 mg in dextrose  5% 125 mL (1 mg/mL) infusion, 5-15 mg/hr, Intravenous,  Titrated, Verdene Purchase, MD   ferrous sulfate  tablet 325 mg, 325 mg, Oral, Q breakfast, Zella, Mir M, MD, 325 mg at 10/29/24 9162   folic acid  (FOLVITE ) tablet 1 mg, 1 mg, Oral, BID, Ikramullah, Mir M, MD, 1 mg at 10/29/24 9162   lidocaine  (PF) (XYLOCAINE ) 1 % injection 30 mL, 30 mL, Intradermal, Once, Vanice Sharper, MD   loratadine  (CLARITIN ) tablet 10 mg, 10 mg, Oral, QHS, Ikramullah, Mir M, MD, 10 mg at 10/28/24 2141   losartan  (COZAAR ) tablet 25 mg, 25 mg, Oral, Daily, Zella, Mir M, MD, 25 mg at 10/28/24 1018   metoprolol  tartrate (LOPRESSOR ) tablet 25 mg, 25 mg, Oral, Q6H PRN, Daniels, James K, NP, 25 mg at 10/29/24 9562   ondansetron  (ZOFRAN ) tablet 4 mg, 4 mg, Oral, Q6H PRN **OR** ondansetron  (ZOFRAN ) injection 4 mg, 4 mg, Intravenous, Q6H PRN, Zella, Mir M, MD   pantoprazole  (PROTONIX ) EC tablet 40 mg, 40 mg, Oral, Q0600, Zella, Mir M, MD, 40 mg at 10/29/24 9162   potassium chloride  SA (KLOR-CON  M)  CR tablet 40 mEq, 40 mEq, Oral, BID, Krishnan, Gokul, MD   thiamine  (VITAMIN B1) tablet 100 mg, 100 mg, Oral, Daily, Zella, Mir M, MD, 100 mg at 10/28/24 1018   traZODone  (DESYREL ) tablet 25 mg, 25 mg, Oral, QHS PRN, Ikramullah, Mir M, MD, 25 mg at 10/28/24 2142

## 2024-10-29 NOTE — Progress Notes (Signed)
 Report given to Manuelita, RN, 4th floor, regarding pt status since Rapid Response. HR remains High @150  yet BP lower. Pt denies pain.

## 2024-10-29 NOTE — Progress Notes (Signed)
 PT Cancellation Note  Patient Details Name: Carla Hanson MRN: [id number] DOB: Sep 06, 1966   Cancelled Treatment:    Reason Eval/Treat Not Completed: Medical issues which prohibited therapy. Patient with rapid response called at 9:02am, HR remained elevated in 150s. Will re-attempt pt when medically appropriate.  Isaiah DEL. Kele Barthelemy, PT, DPT   [employer] 10/29/2024, 1:10 PM

## 2024-10-29 NOTE — Progress Notes (Signed)
 Rapid Response Team called because no changes in HR and BP trending up. Pt also using accessory abd muscles with breathing.

## 2024-10-29 NOTE — Progress Notes (Signed)
" °   10/29/24 0609  Assess: MEWS Score  Temp 98.5 F (36.9 C)  BP 105/62  MAP (mmHg) 76  Pulse Rate (!) 154 (Nurse notified)  Resp 18  Level of Consciousness Alert  SpO2 97 %  O2 Device Room Air  Assess: MEWS Score  MEWS Temp 0  MEWS Systolic 0  MEWS Pulse 3  MEWS RR 0  MEWS LOC 0  MEWS Score 3  MEWS Score Color Yellow  Assess: if the MEWS score is Yellow or Red  Were vital signs accurate and taken at a resting state? Yes  Does the patient meet 2 or more of the SIRS criteria? No  MEWS guidelines implemented  Yes, yellow  Treat  MEWS Interventions Considered administering scheduled or prn medications/treatments as ordered  Take Vital Signs  Increase Vital Sign Frequency  Yellow: Q2hr x1, continue Q4hrs until patient remains green for 12hrs  Escalate  MEWS: Escalate Yellow: Discuss with charge nurse and consider notifying provider and/or RRT  Notify: Charge Nurse/RN  Name of Charge Nurse/RN Notified Carla RN  Provider Notification  Provider Name/Title Blondie NP  Date Provider Notified 10/29/24  Time Provider Notified [phone]  Method of Notification Page  Provider response See new orders  Date of Provider Response 10/29/24  Time of Provider Response 0040  Notify: Rapid Response  Name of Rapid Response RN Notified Timber RN  Date Rapid Response Notified 10/29/24  Time Rapid Response Notified 0100  Assess: SIRS CRITERIA  SIRS Temperature  0  SIRS Respirations  0  SIRS Pulse 1  SIRS WBC 0  SIRS Score Sum  1    "

## 2024-10-29 NOTE — Progress Notes (Signed)
 Brief note:   RN called to review her case Patient is asymptomatic but remains in RVR.  Currently on Amio and Cardizem  gtt.   Temp:  [98.4 F (36.9 C)-98.5 F (36.9 C)] 98.5 F (36.9 C) (12/26 0609) Pulse Rate:  [107-162] 153 (12/26 1415) Cardiac Rhythm: Atrial fibrillation (12/26 1202) Resp:  [18-44] 30 (12/26 1415) BP: (93-139)/(62-90) 102/73 (12/26 1415) SpO2:  [92 %-100 %] 98 % (12/26 1415)  Continue the drips.  Will give IV digoxin  500mcg x1.  Labs reviewed  Continue telemetry.   Dr. Michele 2:44 PM

## 2024-10-29 NOTE — Progress Notes (Addendum)
"  ° °      Overnight   NAME: Carla Hanson MRN: [id number] DOB : 06/26/1966    Date of Service   10/29/2024   HPI/Events of Note    Notified by RN for tachycardia.  Brief history 58 year old female past medical history of DVTOn aspirin  only due to intracranial hemorrhage while on Xarelto . Essential hypertension, multiple myeloma on chemotherapy. Admitted through ER for generalized weakness found to have symptomatic anemia and a hematologist office with a hemoglobin of 5.3. Received 2 units red blood cells and another 2 units on 1218.  Bedside visit EKG shows tachycardia.  Heart rate 150 Patient no stated obvious distress, does appear slightly anxious, but she does admit to this from time to time.  Rhythm verified by in-house cardiology as a-flutter.  Patient was given 2.5 mg IV Lopressor  x 2 with little effect. Also was given 250 cc bolus normal saline.  Contacted in-house Cardiology Fellow.  Cardiology reiterates that rate may stay elevated.  Recommendation: Due to patient's history and prior diagnosis cardiology fellow does not recommend any further than Lopressor  25 mg p.o. every 6 hours. If no resolution in rate after second dose escalate to Lopressor  50 mg p.o. every 6 hours   Patient in no obvious or stated distress, blood pressure remained stable Current vitals blood pressure 122/67 MAP 82 Heart rate 157 Respirations 19 SpO2 96%   Interventions/ Plan   Continue all attending orders STAT AM labs - pending Follow cardiology fellow recommendations Maintain telemetry, change patient's status as needed.      Lynwood Kipper BSN MSNA MSN ACNPC-AG Acute Care Nurse Practitioner [facility] [place]  "

## 2024-10-29 NOTE — Significant Event (Signed)
 Rapid Response Event Note   Reason for Call :  Rapid Heart Rate  Initial Focused Assessment:  Patient alert and oriented, denies SOB despite patient looking a bit labored. Lung sounds clear. HR 150-160's, A-flutter. Patient had been placed on oral Metoprolol  overnight which did not help. Patient currently waiting for a progressive bed, Cardiologist Maude Emmer MD at bedside. Plan is to do Amiodarone  bolus now by myself then place patient on Amiodarone  infusion and Diltiazem  infusion once patient on Progressive care unit. Other than HR vital signs stable. HR trending down post-Amio bolus HR 142, all other vital signs are stable. See Flowsheets for full sets of vital signs.   Interventions:  150mg  Amio bolus administered   Plan of Care:  150mg  Amiodarone  bolus now  Transfer to PCU  Start Amio and Dilt gtt once on PCU  Event Summary:   MD Notified: Maude Emmer MD Call Time: 0902 Arrival Time: 0915 End Time: 1100  Omega LILLETTE Settles, RN

## 2024-10-29 NOTE — Progress Notes (Signed)
 " PROGRESS NOTE    Carla Hanson  FMW:[id number] DOB: 10/20/1966 DOA: 10/20/2024 PCP: Anders Otto DASEN, MD     Brief Narrative:  Carla Hanson is an 58 y.o. 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  Was found to have a big bruise on her right upper extremity.  She was transfused 2 unit packed red blood cell 12/17, another 2 unit 12/18.  Subjective: Overnight events noted.  Patient went into atrial flutter with RVR.  Patient denies any chest pain.  Shortness of breath is as before.  Denies any dizziness or lightheadedness.  No nausea or vomiting.  Assessment & Plan:   Multiple myeloma/Symptomatic anemia, thrombocytopenia Patient has been transfused 4 units of PRBC since admission. Medical oncology is following and managing. Transfuse platelets if counts are below 10,000.  Transfuse PRBC if hemoglobin is less than 7. Patient underwent bone marrow biopsy on 12/24.  Await report. Labs are being checked daily.  Hemoglobin is 9.0 today.  Platelet counts 23,000.  Atrial flutter with RVR Went into atrial flutter overnight.  No previous history of same. Check TSH.  Check echocardiogram once heart rate is better controlled. Cardiology consulted to assist with management since there has been no improvement with metoprolol . Patient has been started on amiodarone  by cardiology. Not a candidate for anticoagulation due to severe thrombocytopenia and previous history of intracranial hemorrhage.  Dyspnea Saturation levels are noted to be normal.  She has been complaining of shortness of breath for a few days. Chest x-ray was done which showed small bilateral pleural effusions.  Nonspecific opacities thought to be atelectasis.  In the absence of a cough  fever this is unlikely to be pneumonia.   Patient was given furosemide  the last 2 days with some improvement in her symptoms.  She did diurese well.  Hold off on further doses for now due to her change in rhythm.  Will reevaluate tomorrow. Continue with incentive spirometry.  No indication for antibiotics at this time.  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  Unclear if she received any bisphosphonate recently.  Calcium  level stable for the most part.  Continue to monitor.    Hypokalemia/hypomagnesemia Continue to supplement as indicated.  Essential hypertension Noted to be on losartan .  Monitor blood pressures closely.  History of PE Not on anticoagulant due to history of intracranial hemorrhage.  She is on aspirin  as outpatient, currently holding due to thrombocytopenia   DVT prophylaxis: SCDs Code Status: Full Family Communication: Discussed with patient.  No family at bedside today. Disposition Plan: SNF is recommended   Objective: Vitals:   10/29/24 1018 10/29/24 1025 10/29/24 1030 10/29/24 1035  BP: 120/76 139/76 115/74 127/64  Pulse: (!) 157 (!) 150 (!) 149 (!) 149  Resp: 19     Temp:      TempSrc:      SpO2: 93%   95%  Weight:      Height:        Intake/Output Summary (Last 24 hours) at 10/29/2024 1050 Last data filed at 10/29/2024 1000 Gross per 24 hour  Intake 945.06 ml  Output 2500 ml  Net -1554.94 ml   Filed Weights   10/20/24 1548  Weight: 92.6 kg    Examination:   General appearance: Awake alert.  In no distress Resp: Clear to auscultation bilaterally.  Normal effort Cardio: S1-S2 is tachycardic regular. GI: Abdomen is soft.  Nontender nondistended.  Bowel sounds are present normal.  No masses organomegaly Extremities: No significant edema.  Physical deconditioning is noted. No obvious focal neurological deficits.  Data Reviewed: I have personally reviewed following labs and reports of imaging studies  CBC: Recent Labs  Lab  10/24/24 0821 10/25/24 0416 10/25/24 1929 10/26/24 0502 10/27/24 0458 10/28/24 0500 10/29/24 0434  WBC 14.0* 12.7* 12.6* 11.7* 9.8 10.2 9.9  NEUTROABS 5.6 4.8  --   --  3.9  --   --   HGB 8.3* 8.1* 8.3* 7.9* 8.1* 7.9* 9.0*  HCT 25.2* 24.9* 25.4* 24.7* 25.2* 24.9* 28.4*  MCV 85.7 85.6 85.5 86.4 87.2 87.4 87.7  PLT 17* 16* 17* 18* 19* 18* 23*   Basic Metabolic Panel: Recent Labs  Lab 10/24/24 0507 10/25/24 0416 10/25/24 1929 10/26/24 0502 10/27/24 0458 10/28/24 0500 10/29/24 0434  NA 141 140 140 142 140 143 142  K 3.4* 3.2* 3.5 3.2* 3.9 2.9* 3.7  CL 110 110 109 109 107 106 106  CO2 22 20* 22 22 20* 24 25  GLUCOSE 97 97 111* 101* 100* 97 110*  BUN 6 5* 6 6 6 8 9   CREATININE 0.43* 0.42* 0.40* 0.39* 0.36* 0.41* 0.46  CALCIUM  11.0* 10.9* 10.9* 11.1* 10.4* 9.9 10.1  MG  --   --   --  1.5* 2.5* 2.1 2.3  PHOS 2.0* 2.3*  --   --   --   --   --    GFR: Estimated Creatinine Clearance: 80.3 mL/min (by C-G formula based on SCr of 0.46 mg/dL).   Liver Function Tests: Recent Labs  Lab 10/24/24 0507 10/25/24 0416 10/26/24 0502  AST  --   --  34  ALT  --   --  6  ALKPHOS  --   --  141*  BILITOT  --   --  0.8  PROT  --   --  7.2  ALBUMIN 2.7* 2.9* 2.8*    Radiology Studies: No results found.   Scheduled Meds:  acyclovir   400 mg Oral BID   calcitonin (salmon)  1 spray Alternating Nares Daily   ferrous sulfate   325 mg Oral Q breakfast   folic acid   1 mg Oral BID   lidocaine  (PF)  30 mL Intradermal Once   loratadine   10 mg Oral QHS   pantoprazole   40 mg Oral Q0600   potassium chloride   40 mEq Oral BID   thiamine   100 mg Oral Daily   Continuous Infusions:  amiodarone      Followed by   amiodarone      diltiazem  (CARDIZEM ) infusion        LOS: 3 days    Joette Pebbles,  [facility] 10/29/2024, 10:50 AM   Available via Epic secure chat 7am-7pm After these hours, please refer to coverage provider listed on [email or web address]  "

## 2024-10-29 NOTE — Progress Notes (Signed)
 Cardiology on call contacted regarding pt w/ unchanged HR despite Amio and Dilt gtt. Pt currently asymptomatic. MD okay with keeping systolic BP >100 for Dilt titration. See new orders for additional medications. Will continue to monitor patient closely.

## 2024-10-29 NOTE — Plan of Care (Signed)
" °  Problem: Clinical Measurements: Goal: Diagnostic test results will improve Outcome: Progressing Goal: Cardiovascular complication will be avoided Outcome: Progressing   Problem: Nutrition: Goal: Adequate nutrition will be maintained Outcome: Progressing   Problem: Pain Managment: Goal: General experience of comfort will improve and/or be controlled Outcome: Progressing   Problem: Pain Managment: Goal: General experience of comfort will improve and/or be controlled Outcome: Progressing   "

## 2024-10-29 NOTE — Progress Notes (Signed)
 RRT Charge RN, Omega at bedside as well as Development Worker, [employer].

## 2024-10-29 NOTE — Progress Notes (Signed)
 Current HR 154 (RN aware). RT discontinued Albuterol  nebs- BBS clear, diminished, Sp02 97% on RA. PT does have diagnosis of COPD with no maintenance meds ordered this admission (MD note 12/25).

## 2024-10-29 NOTE — Progress Notes (Signed)
" °   10/29/24 1205  Assess: MEWS Score  BP 116/74  MAP (mmHg) 85  ECG Heart Rate (!) 148  Resp (!) 31  Level of Consciousness Alert  SpO2 97 %  O2 Device Room Air  Assess: MEWS Score  MEWS Temp 0  MEWS Systolic 0  MEWS Pulse 3  MEWS RR 2  MEWS LOC 0  MEWS Score 5  MEWS Score Color Red  Assess: if the MEWS score is Yellow or Red  Were vital signs accurate and taken at a resting state? Yes  Does the patient meet 2 or more of the SIRS criteria? Yes  Does the patient have a confirmed or suspected source of infection? No  MEWS guidelines implemented  Yes, red  Treat  MEWS Interventions Considered administering scheduled or prn medications/treatments as ordered  Take Vital Signs  Increase Vital Sign Frequency  Red: Q1hr x2, continue Q4hrs until patient remains green for 12hrs  Escalate  MEWS: Escalate Red: Discuss with charge nurse and notify provider. Consider notifying RRT. If remains red for 2 hours consider need for higher level of care  Notify: Charge Nurse/RN  Name of Charge Nurse/RN Notified Tara, Counselling Psychologist Name/Title Provided already aware of patient status  Notify: Rapid Response  Name of Rapid Response RN Notified Rapid RN already aware of patient status  Assess: SIRS CRITERIA  SIRS Temperature  0  SIRS Respirations  1  SIRS Pulse 1  SIRS WBC 0  SIRS Score Sum  2    "

## 2024-10-30 ENCOUNTER — Inpatient Hospital Stay (HOSPITAL_COMMUNITY)

## 2024-10-30 DIAGNOSIS — C9000 Multiple myeloma not having achieved remission: Secondary | ICD-10-CM | POA: Diagnosis not present

## 2024-10-30 DIAGNOSIS — D649 Anemia, unspecified: Secondary | ICD-10-CM | POA: Diagnosis not present

## 2024-10-30 DIAGNOSIS — I4892 Unspecified atrial flutter: Secondary | ICD-10-CM

## 2024-10-30 DIAGNOSIS — R0600 Dyspnea, unspecified: Secondary | ICD-10-CM | POA: Diagnosis not present

## 2024-10-30 LAB — MAGNESIUM: Magnesium: 2 mg/dL (ref 1.7–2.4)

## 2024-10-30 LAB — CBC
HCT: 27.6 % — ABNORMAL LOW (ref 36.0–46.0)
Hemoglobin: 8.6 g/dL — ABNORMAL LOW (ref 12.0–15.0)
MCH: 27.7 pg (ref 26.0–34.0)
MCHC: 31.2 g/dL (ref 30.0–36.0)
MCV: 89 fL (ref 80.0–100.0)
Platelets: 22 K/uL — CL (ref 150–400)
RBC: 3.1 MIL/uL — ABNORMAL LOW (ref 3.87–5.11)
RDW: 17 % — ABNORMAL HIGH (ref 11.5–15.5)
WBC: 9.1 K/uL (ref 4.0–10.5)
nRBC: 16.2 % — ABNORMAL HIGH (ref 0.0–0.2)

## 2024-10-30 LAB — ECHOCARDIOGRAM COMPLETE
AR max vel: 2.51 cm2
AV Area VTI: 2.62 cm2
AV Area mean vel: 2.5 cm2
AV Mean grad: 9 mmHg
AV Peak grad: 18.6 mmHg
Ao pk vel: 2.16 m/s
Area-P 1/2: 4.71 cm2
Height: 61.5 in
S' Lateral: 3.4 cm
Weight: 3266.34 [oz_av]

## 2024-10-30 LAB — BASIC METABOLIC PANEL WITH GFR
Anion gap: 14 (ref 5–15)
BUN: 12 mg/dL (ref 6–20)
CO2: 22 mmol/L (ref 22–32)
Calcium: 10 mg/dL (ref 8.9–10.3)
Chloride: 104 mmol/L (ref 98–111)
Creatinine, Ser: 0.58 mg/dL (ref 0.44–1.00)
GFR, Estimated: >60 mL/min
Glucose, Bld: 133 mg/dL — ABNORMAL HIGH (ref 70–99)
Potassium: 3.5 mmol/L (ref 3.5–5.1)
Sodium: 141 mmol/L (ref 135–145)

## 2024-10-30 LAB — PHOSPHORUS: Phosphorus: 2.4 mg/dL — ABNORMAL LOW (ref 2.5–4.6)

## 2024-10-30 MED ORDER — POTASSIUM PHOSPHATES 15 MMOLE/5ML IV SOLN
15.0000 mmol | Freq: Once | INTRAVENOUS | Status: AC
  Administered 2024-10-31: 15 mmol via INTRAVENOUS
  Filled 2024-10-30: qty 5

## 2024-10-30 MED ORDER — SENNOSIDES-DOCUSATE SODIUM 8.6-50 MG PO TABS
2.0000 | ORAL_TABLET | Freq: Two times a day (BID) | ORAL | Status: DC
  Administered 2024-10-31 – 2024-11-05 (×2): 2 via ORAL
  Filled 2024-10-30 (×3): qty 2

## 2024-10-30 MED ORDER — PERFLUTREN LIPID MICROSPHERE
1.0000 mL | INTRAVENOUS | Status: AC | PRN
  Administered 2024-10-30: 3 mL via INTRAVENOUS

## 2024-10-30 MED ORDER — POLYETHYLENE GLYCOL 3350 17 G PO PACK
17.0000 g | PACK | Freq: Every day | ORAL | Status: DC
  Administered 2024-10-30: 17 g via ORAL
  Filled 2024-10-30: qty 1

## 2024-10-30 NOTE — Progress Notes (Signed)
 " PROGRESS NOTE    Carla Hanson  FMW:[id number] DOB: 12-Jun-1966 DOA: 10/20/2024 PCP: Anders Otto DASEN, MD     Brief Narrative:  Carla Hanson is an 58 y.o. 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  Was found to have a big bruise on her right upper extremity.  She was transfused 2 unit packed red blood cell 12/17, another 2 unit 12/18.  Subjective: Patient feels well this morning.  Denies any shortness of breath.  Has experienced some chest tightness recently but none currently.  No dizziness or lightheadedness.    Assessment & Plan:   Multiple myeloma/Symptomatic anemia, thrombocytopenia Patient has been transfused 4 units of PRBC since admission. Medical oncology is following and managing. Transfuse platelets if counts are below 10,000.  Transfuse PRBC if hemoglobin is less than 7. Patient underwent bone marrow biopsy on 12/24.  Pathology report is available.  Await oncology input.  He is followed by Dr. Onesimo with medical oncology. Labs are being checked daily.  Hemoglobin is 8.6.  Platelet count is 22,000.  Atrial flutter with RVR Went into atrial flutter.  No previous history of same. TSH noted to be normal. Cardiology was consulted.  Patient was initially placed on Cardizem  infusion and then started on amiodarone  as well.  Seems to have converted to sinus rhythm this morning. Will proceed with echocardiogram. Further management per cardiology. Not a candidate for anticoagulation due to severe thrombocytopenia and previous history of intracranial hemorrhage.  Dyspnea Saturation levels are noted to be normal.  She has been complaining of shortness of breath for a few days. Chest x-ray was done which showed small bilateral pleural  effusions.  Nonspecific opacities thought to be atelectasis.  In the absence of a cough fever this is unlikely to be pneumonia.   Patient was given furosemide  the last 2 days with some improvement in her symptoms.  She did not get a dose yesterday due to onset of atrial fibrillation and need for multiple agents to control heart rate.  Consider giving an additional dose tomorrow if her blood pressure remains stable and if she is off of Cardizem  infusion. Continue with incentive spirometry.  No indication for antibiotics at this time.  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  Unclear if she received any bisphosphonate recently.  Calcium  levels have improved during this hospital stay.    Hypokalemia/hypomagnesemia/hypophosphatemia Continue to supplement as indicated.  Essential hypertension ARB held to avoid hypotension.  History of PE Not on anticoagulant due to history of intracranial hemorrhage.  She is on aspirin  as outpatient, currently holding due to thrombocytopenia   DVT prophylaxis: SCDs Code Status: Full Family Communication: Discussed with patient.  No family at bedside today. Disposition Plan: SNF is recommended   Objective: Vitals:   10/30/24 0530 10/30/24 0600 10/30/24 0630 10/30/24 0700  BP: 118/64 125/75 116/62 118/84  Pulse: (!) 106 80 86 94  Resp: (!) 24 14 (!) 22 (!) 40  Temp:      TempSrc:      SpO2: 100% 100% 99% 100%  Weight:      Height:        Intake/Output Summary (Last 24 hours) at 10/30/2024 0945 Last data filed at 10/30/2024 0600 Gross per 24 hour  Intake 790.51 ml  Output  300 ml  Net 490.51 ml   Filed Weights   10/20/24 1548  Weight: 92.6 kg    Examination:   General appearance: Awake alert.  In no distress Resp: Clear to auscultation bilaterally.  Normal effort Cardio: S1-S2 is normal regular.  No S3-S4.  No rubs murmurs or bruit GI: Abdomen is soft.  Nontender nondistended.  Bowel sounds are present normal.  No masses  organomegaly Extremities: No edema.  Full range of motion of lower extremities.  Physical deconditioning is noted. Neurologic: Alert and oriented x3.  No focal neurological deficits.    Data Reviewed: I have personally reviewed following labs and reports of imaging studies  CBC: Recent Labs  Lab 10/24/24 0821 10/25/24 0416 10/25/24 1929 10/26/24 0502 10/27/24 0458 10/28/24 0500 10/29/24 0434 10/30/24 0546  WBC 14.0* 12.7*   < > 11.7* 9.8 10.2 9.9 9.1  NEUTROABS 5.6 4.8  --   --  3.9  --   --   --   HGB 8.3* 8.1*   < > 7.9* 8.1* 7.9* 9.0* 8.6*  HCT 25.2* 24.9*   < > 24.7* 25.2* 24.9* 28.4* 27.6*  MCV 85.7 85.6   < > 86.4 87.2 87.4 87.7 89.0  PLT 17* 16*   < > 18* 19* 18* 23* 22*   < > = values in this interval not displayed.   Basic Metabolic Panel: Recent Labs  Lab 10/24/24 0507 10/25/24 0416 10/25/24 1929 10/26/24 0502 10/27/24 0458 10/28/24 0500 10/29/24 0434 10/30/24 0546  NA 141 140   < > 142 140 143 142 141  K 3.4* 3.2*   < > 3.2* 3.9 2.9* 3.7 3.5  CL 110 110   < > 109 107 106 106 104  CO2 22 20*   < > 22 20* 24 25 22   GLUCOSE 97 97   < > 101* 100* 97 110* 133*  BUN 6 5*   < > 6 6 8 9 12   CREATININE 0.43* 0.42*   < > 0.39* 0.36* 0.41* 0.46 0.58  CALCIUM  11.0* 10.9*   < > 11.1* 10.4* 9.9 10.1 10.0  MG  --   --   --  1.5* 2.5* 2.1 2.3 2.0  PHOS 2.0* 2.3*  --   --   --   --   --  2.4*   < > = values in this interval not displayed.   GFR: Estimated Creatinine Clearance: 80.3 mL/min (by C-G formula based on SCr of 0.58 mg/dL).   Liver Function Tests: Recent Labs  Lab 10/24/24 0507 10/25/24 0416 10/26/24 0502  AST  --   --  34  ALT  --   --  6  ALKPHOS  --   --  141*  BILITOT  --   --  0.8  PROT  --   --  7.2  ALBUMIN 2.7* 2.9* 2.8*    Radiology Studies: No results found.   Scheduled Meds:  acyclovir   400 mg Oral BID   budesonide -glycopyrrolate -formoterol   2 puff Inhalation BID   calcitonin (salmon)  1 spray Alternating Nares Daily   ferrous  sulfate  325 mg Oral Q breakfast   folic acid   1 mg Oral BID   lidocaine  (PF)  30 mL Intradermal Once   loratadine   10 mg Oral QHS   pantoprazole   40 mg Oral Q0600   polyethylene glycol  17 g Oral Daily   potassium chloride   40 mEq Oral BID   senna-docusate  2 tablet Oral BID  thiamine   100 mg Oral Daily   Continuous Infusions:  amiodarone  30 mg/hr (10/30/24 0534)   diltiazem  (CARDIZEM ) infusion 15 mg/hr (10/30/24 0533)      LOS: 4 days    Joette Pebbles,  [facility] 10/30/2024, 9:45 AM   Available via Epic secure chat 7am-7pm After these hours, please refer to coverage provider listed on [email or web address]  "

## 2024-10-30 NOTE — Progress Notes (Signed)
"  °  Progress Note  Patient Name: Carla Hanson Date of Encounter: 10/30/2024 [facility] Cardiologist: Maude Emmer, MD new  Interval Summary   No chest pain, prior echo with normal EF in 2023. She converted at about 5 AM this morning with both IV amiodarone  and IV diltiazem .  Vital Signs Vitals:   10/30/24 0530 10/30/24 0600 10/30/24 0630 10/30/24 0700  BP: 118/64 125/75 116/62 118/84  Pulse: (!) 106 80 86 94  Resp: (!) 24 14 (!) 22 (!) 40  Temp:      TempSrc:      SpO2: 100% 100% 99% 100%  Weight:      Height:        Intake/Output Summary (Last 24 hours) at 10/30/2024 0915 Last data filed at 10/30/2024 0600 Gross per 24 hour  Intake 790.51 ml  Output 300 ml  Net 490.51 ml      10/20/2024    3:48 PM 10/20/2024    8:43 AM 09/22/2024    1:16 PM  Last 3 Weights  Weight (lbs) 204 lb 2.3 oz -- 213 lb  Weight (kg) 92.6 kg -- 96.616 kg      Telemetry/ECG  Currently sinus rhythm, 90- Personally Reviewed  Physical Exam  GEN: No acute distress.   Neck: No JVD Cardiac: RRR, no murmurs, rubs, or gallops.  Respiratory: Clear to auscultation bilaterally. GI: Soft, nontender, non-distended  MS: Large bruises noted  Assessment & Plan  58 year old with severe pancytopenia in the setting of multiple myeloma chemotherapy with subsequent atrial flutter RVR.  Atrial flutter/fibrillation RVR - Currently on IV amiodarone .  Challenging to rate control.  Received a one-time dose of IV digoxin  500 mcg yesterday afternoon.  May not be a bad idea to continue IV amiodarone  and IV diltiazem  15 today to help ward off further episodes, then tomorrow change for p.o. amiodarone  short course 200 mg twice a day as well as diltiazem  long-acting 240 mg daily. - Converted at approximately 5 AM this morning after 30 minutes of atrial fibrillation. - Not a candidate for anticoagulation given her severe secondary pancytopenia and risk of repeat intracranial hemorrhage.  Severe  pancytopenia prior ICH - Not a candidate for anticoagulation.  For questions or updates, please contact [facility] Please consult [email or web address] for contact info under         Signed, Oneil Parchment, MD   "

## 2024-10-31 ENCOUNTER — Inpatient Hospital Stay (HOSPITAL_COMMUNITY)

## 2024-10-31 DIAGNOSIS — D649 Anemia, unspecified: Secondary | ICD-10-CM | POA: Diagnosis not present

## 2024-10-31 DIAGNOSIS — C9000 Multiple myeloma not having achieved remission: Secondary | ICD-10-CM | POA: Diagnosis not present

## 2024-10-31 DIAGNOSIS — I4892 Unspecified atrial flutter: Secondary | ICD-10-CM | POA: Diagnosis not present

## 2024-10-31 DIAGNOSIS — R0600 Dyspnea, unspecified: Secondary | ICD-10-CM | POA: Diagnosis not present

## 2024-10-31 LAB — CBC
HCT: 22 % — ABNORMAL LOW (ref 36.0–46.0)
Hemoglobin: 7.1 g/dL — ABNORMAL LOW (ref 12.0–15.0)
MCH: 28.3 pg (ref 26.0–34.0)
MCHC: 32.3 g/dL (ref 30.0–36.0)
MCV: 87.6 fL (ref 80.0–100.0)
Platelets: 18 K/uL — CL (ref 150–400)
RBC: 2.51 MIL/uL — ABNORMAL LOW (ref 3.87–5.11)
RDW: 16.7 % — ABNORMAL HIGH (ref 11.5–15.5)
WBC: 8.5 K/uL (ref 4.0–10.5)
nRBC: 14.3 % — ABNORMAL HIGH (ref 0.0–0.2)

## 2024-10-31 LAB — BASIC METABOLIC PANEL WITH GFR
Anion gap: 6 (ref 5–15)
BUN: 9 mg/dL (ref 6–20)
CO2: 29 mmol/L (ref 22–32)
Calcium: 10 mg/dL (ref 8.9–10.3)
Chloride: 106 mmol/L (ref 98–111)
Creatinine, Ser: 0.56 mg/dL (ref 0.44–1.00)
GFR, Estimated: >60 mL/min
Glucose, Bld: 103 mg/dL — ABNORMAL HIGH (ref 70–99)
Potassium: 4.1 mmol/L (ref 3.5–5.1)
Sodium: 141 mmol/L (ref 135–145)

## 2024-10-31 LAB — MAGNESIUM: Magnesium: 1.8 mg/dL (ref 1.7–2.4)

## 2024-10-31 LAB — PHOSPHORUS: Phosphorus: 2.5 mg/dL (ref 2.5–4.6)

## 2024-10-31 MED ORDER — AMIODARONE HCL 200 MG PO TABS
200.0000 mg | ORAL_TABLET | Freq: Two times a day (BID) | ORAL | Status: DC
  Administered 2024-10-31 – 2024-11-05 (×11): 200 mg via ORAL
  Filled 2024-10-31 (×2): qty 1

## 2024-10-31 MED ORDER — TIZANIDINE HCL 4 MG PO TABS
4.0000 mg | ORAL_TABLET | Freq: Two times a day (BID) | ORAL | Status: DC | PRN
  Administered 2024-10-31 – 2024-11-03 (×4): 4 mg via ORAL
  Filled 2024-10-31 (×2): qty 1

## 2024-10-31 MED ORDER — DILTIAZEM HCL ER COATED BEADS 240 MG PO CP24
240.0000 mg | ORAL_CAPSULE | Freq: Every day | ORAL | Status: DC
  Administered 2024-10-31 – 2024-11-05 (×6): 240 mg via ORAL
  Filled 2024-10-31: qty 1

## 2024-10-31 NOTE — Progress Notes (Signed)
 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  She received 2 units PRBCs 12/17 and another 2 units 12/18.  Medical oncology was consulted.  Patient underwent bone marrow biopsy 12/24. Hospital course further complicated by a flutter with RVR.  Cardiology was consulted and patient was started on Cardizem  and then amiodarone .  She converted to NSR in 12/27.  On 12/28 she was transition to p.o. medications.   Subjective: This morning patient reports she generally feels unwell.  She believes that she is hungry, she would like to order some lunch.  I encouraged her to do so.  Denies any shortness of breath this morning.  Believes that the inhaler has been helping this significantly.  Objective: Vitals:   10/31/24 0300 10/31/24 0400 10/31/24 0500 10/31/24 0600  BP: 114/68 119/69 118/67 107/75  Pulse:      Resp: (!) 23 13 (!) 28 20  Temp:      TempSrc:      SpO2:      Weight:      Height:        Intake/Output Summary (Last 24 hours) at 10/31/2024 0832 Last data filed at 10/31/2024 0600 Gross per 24 hour  Intake 984.72 ml  Output 500 ml  Net 484.72 ml   Filed Weights   10/20/24 1548  Weight: 92.6 kg    Examination: General exam: Appears calm and comfortable, NAD, chronically ill.  HEENT. Respiratory system: No work of breathing, symmetric chest wall expansion Cardiovascular system: S1 & S2 heard, RRR.  Gastrointestinal system: Abdomen is nondistended, soft and nontender.  Neuro: Alert and oriented.  Slight tremor  bilaterally  Assessment & Plan:  Principal Problem:   Symptomatic anemia Active Problems:   Essential hypertension   History of pulmonary embolism   Iron deficiency anemia   Anemia   Hypercalcemia of malignancy   Multiple myeloma not having achieved remission (HCC)   Folate deficiency   Anemia due to antineoplastic agent   Counseling regarding advance care planning and goals of care   Thrombocytopenia   Multiple myeloma Symptomatic anemia Thrombocytopenia - Status post 4 units PRBC since arrival - Medical oncology following, no comment some bone marrow biopsy yet. - Bone marrow biopsy 12/24.  Pathology report available.  Awaiting oncology input.  Followed by Dr. Onesimo with med-onc - Continue CBCs. Hgb continues to downtrend currently at 7.1.  Will likely require another transfusion soon, have discussed this with her and she consents to this. - Platelets continue to downtrend.  Now at 18K will transfuse if counts fall below 10,000 - Continue with acyclovir , folic acid , thiamine , ferrous sulfate   A-flutter with RVR - New diagnosis - TSH WNL - Cardiology consulted.  Initially on Cardizem  then amiodarone .  Received digoxin  one-time dose.  Rate has been difficult to control - Has been on IV amiodarone  and IV diltiazem .  Transitioning to p.o. now.  Cardiology recommends short course of p.o. amiodarone  200 mg twice daily x 30 days, addition to diltiazem  long-acting 240 mg daily - NSR now -  Echocardiogram: LVEF 60 to 65%, no RWMA, diastolic parameters indeterminate.  Right ventricle mildly enlarged.  Mildly elevated pulmonary artery systolic pressure.  Large pleural effusion to the left lateral region significant valvular disease noted.  Effusion not noted on CXR today.   - Not a candidate for anticoagulation due to severe thrombocytopenia and history of prior intracranial hemorrhage  Pleural effusion - Has received Lasix  with some improvement.  Not currently receiving due to soft blood  pressures - Pleural effusion seems large on echocardiogram, repeat CXR this a.m. shows very small effusions. - Continue with as needed IV Lasix  - Repeat CXR this a.m. repeat IV Lasix  as needed  Dyspnea - O2 sats remained WNL - CXR with small bilateral pleural effusions and nonspecific opacities likely atelectasis - Remains without cough or fever.  Unlikely to be pneumonia - Did receive Lasix  in the last 2 days with some improvement in her symptoms.  Continue with additional Lasix  as needed - On Breztri .  She reports significant improvement with this.  Does take albuterol  outpatient as needed - Continue with I-S - No indication for antibiotics at this time - Continue PT/OT  Hypercalcemia of malignancy - On calcitonin, unclear if any recent bisphosphonates - Calcium  levels have improved during this hospital stay - Heme-onc following  Hypokalemia Hypomagnesemia Hypophosphatemia - Continue trending and replace as needed  Hypertension - Meant to be on ARB, currently held due to hypotension.  Follow BPs closely and resume as able.  Titrate  History of pulmonary embolism History of DVT - Not on anticoagulation due to history of intracranial hemorrhage on Xarelto  - On aspirin  outpatient.  Currently holding secondary to thrombocytopenia.  Body mass index is 37.95 kg/m. Obesity Class 2 - Outpatient follow up for lifestyle modification and risk factor management  Generalized weakness - Secondary to her multiple comorbidities as above. - PT/OT consulted.  Will need SNF.  TOC consulted for placement   DVT prophylaxis: SCDs only.  Hold anticoagulation given thrombocytopenia.   Code Status: Full Code Disposition: Anticipate she is nearing medical readiness.  Will need SNF at DC.  TOC consulted for these arrangements  Consultants:  Treatment Team:  Consulting Physician: Onesimo Emaline Brink, MD  Procedures:    Antimicrobials:  Anti-infectives (From admission, onward)    Start      Dose/Rate Route Frequency Ordered Stop   10/26/24 1145  acyclovir  (ZOVIRAX ) tablet 400 mg        400 mg Oral 2 times daily 10/26/24 1048         Data Reviewed: I have personally reviewed following labs and imaging studies CBC: Recent Labs  Lab 10/25/24 0416 10/25/24 1929 10/27/24 0458 10/28/24 0500 10/29/24 0434 10/30/24 0546 10/31/24 0546  WBC 12.7*   < > 9.8 10.2 9.9 9.1 8.5  NEUTROABS 4.8  --  3.9  --   --   --   --   HGB 8.1*   < > 8.1* 7.9* 9.0* 8.6* 7.1*  HCT 24.9*   < > 25.2* 24.9* 28.4* 27.6* 22.0*  MCV 85.6   < > 87.2 87.4 87.7 89.0 87.6  PLT 16*   < > 19* 18* 23* 22* 18*   < > = values in this interval not displayed.   Basic Metabolic Panel: Recent Labs  Lab 10/25/24 0416 10/25/24 1929 10/27/24 0458 10/28/24 0500 10/29/24 0434 10/30/24 0546 10/31/24 0546  NA 140   < > 140 143 142 141 141  K 3.2*   < > 3.9 2.9* 3.7 3.5  4.1  CL 110   < > 107 106 106 104 106  CO2 20*   < > 20* 24 25 22 29   GLUCOSE 97   < > 100* 97 110* 133* 103*  BUN 5*   < > 6 8 9 12 9   CREATININE 0.42*   < > 0.36* 0.41* 0.46 0.58 0.56  CALCIUM  10.9*   < > 10.4* 9.9 10.1 10.0 10.0  MG  --    < > 2.5* 2.1 2.3 2.0 1.8  PHOS 2.3*  --   --   --   --  2.4* 2.5   < > = values in this interval not displayed.   GFR: Estimated Creatinine Clearance: 80.3 mL/min (by C-G formula based on SCr of 0.56 mg/dL). Liver Function Tests: Recent Labs  Lab 10/25/24 0416 10/26/24 0502  AST  --  34  ALT  --  6  ALKPHOS  --  141*  BILITOT  --  0.8  PROT  --  7.2  ALBUMIN 2.9* 2.8*   CBG: No results for input(s): GLUCAP in the last 168 hours.  No results found for this or any previous visit (from the past 240 hours).   Radiology Studies: ECHOCARDIOGRAM COMPLETE Result Date: 10/30/2024    ECHOCARDIOGRAM REPORT   Patient Name:   WAJIHA VERSTEEG [facility] Date of Exam: 10/30/2024 Medical Rec #:  [id number]      Height:       61.5 in Accession #:    [id number]     Weight:       204.1 lb Date of Birth:   October 11, 1966     BSA:          1.917 m Patient Age:    58 years       BP:           126/70 mmHg Patient Gender: F              HR:           86 bpm. Exam Location:  Inpatient Procedure: 2D Echo, Cardiac Doppler, Color Doppler and Intracardiac            Opacification Agent (Both Spectral and Color Flow Doppler were            utilized during procedure). Indications:    Atrial Flutter  History:        Patient has prior history of Echocardiogram examinations, most                 recent 12/05/2021.  Sonographer:    Philomena Daring Referring Phys: [id number] JOETTE PEBBLES IMPRESSIONS  1. Left ventricular ejection fraction, by estimation, is 60 to 65%. The left ventricle has normal function. The left ventricle has no regional wall motion abnormalities. Left ventricular diastolic parameters are indeterminate.  2. Right ventricular systolic function is normal. The right ventricular size is mildly enlarged. There is mildly elevated pulmonary artery systolic pressure. The estimated right ventricular systolic pressure is 39.9 mmHg.  3. Large pleural effusion in the left lateral region.  4. The mitral valve was not well visualized. No evidence of mitral valve regurgitation.  5. The aortic valve is tricuspid. Aortic valve regurgitation is not visualized. No aortic stenosis is present.  6. The inferior vena cava is dilated in size with <50% respiratory variability, suggesting right atrial pressure of 15 mmHg. Comparison(s): Prior images reviewed side by side. 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 FINDINGS  Left Ventricle: Left ventricular ejection  fraction, by estimation, is 60 to 65%. The left ventricle has normal function. The left ventricle has no regional wall motion abnormalities. The left ventricular internal cavity size was normal in size. There is  no left ventricular hypertrophy. Left ventricular diastolic parameters are indeterminate. Right Ventricle: The right ventricular size is mildly enlarged. No increase in  right ventricular wall thickness. Right ventricular systolic function is normal. There is mildly elevated pulmonary artery systolic pressure. The tricuspid regurgitant velocity is 2.23 m/s, and with an assumed right atrial pressure of 20 mmHg, the estimated right ventricular systolic pressure is 39.9 mmHg. Left Atrium: Left atrial size was normal in size. Right Atrium: Right atrial size was normal in size. Pericardium: There is no evidence of pericardial effusion. Mitral Valve: The mitral valve was not well visualized. No evidence of mitral valve regurgitation. Tricuspid Valve: The tricuspid valve is normal in structure. Tricuspid valve regurgitation is trivial. No evidence of tricuspid stenosis. Aortic Valve: The aortic valve is tricuspid. Aortic valve regurgitation is not visualized. No aortic stenosis is present. Aortic valve mean gradient measures 9.0 mmHg. Aortic valve peak gradient measures 18.6 mmHg. Aortic valve area, by VTI measures 2.62  cm. Pulmonic Valve: The pulmonic valve was normal in structure. Pulmonic valve regurgitation is trivial. No evidence of pulmonic stenosis. Aorta: The aortic root and ascending aorta are structurally normal, with no evidence of dilitation. Venous: The inferior vena cava is dilated in size with less than 50% respiratory variability, suggesting right atrial pressure of 15 mmHg. IAS/Shunts: No atrial level shunt detected by color flow Doppler. Additional Comments: There is a large pleural effusion in the left lateral region.  LEFT VENTRICLE PLAX 2D LVIDd:         5.00 cm   Diastology LVIDs:         3.40 cm   LV e' medial:    7.72 cm/s LV PW:         1.00 cm   LV E/e' medial:  12.9 LV IVS:        0.90 cm   LV e' lateral:   11.00 cm/s LVOT diam:     2.00 cm   LV E/e' lateral: 9.1 LV SV:         87 LV SV Index:   45 LVOT Area:     3.14 cm  RIGHT VENTRICLE             IVC RV S prime:     20.40 cm/s  IVC diam: 2.60 cm TAPSE (M-mode): 3.2 cm LEFT ATRIUM             Index         RIGHT ATRIUM           Index LA Vol (A2C):   53.7 ml 28.01 ml/m  RA Area:     17.00 cm LA Vol (A4C):   34.2 ml 17.84 ml/m  RA Volume:   39.90 ml  20.82 ml/m LA Biplane Vol: 43.6 ml 22.75 ml/m  AORTIC VALVE AV Area (Vmax):    2.51 cm AV Area (Vmean):   2.50 cm AV Area (VTI):     2.62 cm AV Vmax:           215.50 cm/s AV Vmean:          137.500 cm/s AV VTI:            0.333 m AV Peak Grad:      18.6 mmHg AV Mean Grad:  9.0 mmHg LVOT Vmax:         172.00 cm/s LVOT Vmean:        109.500 cm/s LVOT VTI:          0.278 m LVOT/AV VTI ratio: 0.83  AORTA Ao Root diam: 3.20 cm Ao Asc diam:  3.00 cm MITRAL VALVE                TRICUSPID VALVE MV Area (PHT): 4.71 cm     TR Peak grad:   19.9 mmHg MV Decel Time: 161 msec     TR Vmax:        223.00 cm/s MV E velocity: 99.80 cm/s MV A velocity: 101.00 cm/s  SHUNTS MV E/A ratio:  0.99         Systemic VTI:  0.28 m                             Systemic Diam: 2.00 cm Stanly Leavens MD Electronically signed by Stanly Leavens MD Signature Date/Time: 10/30/2024/3:14:55 PM    Final     Scheduled Meds:  acyclovir   400 mg Oral BID   budesonide -glycopyrrolate -formoterol   2 puff Inhalation BID   calcitonin (salmon)  1 spray Alternating Nares Daily   ferrous sulfate   325 mg Oral Q breakfast   folic acid   1 mg Oral BID   lidocaine  (PF)  30 mL Intradermal Once   loratadine   10 mg Oral QHS   pantoprazole   40 mg Oral Q0600   polyethylene glycol  17 g Oral Daily   potassium chloride   40 mEq Oral BID   senna-docusate  2 tablet Oral BID   thiamine   100 mg Oral Daily   Continuous Infusions:  amiodarone  30 mg/hr (10/30/24 2122)   diltiazem  (CARDIZEM ) infusion 15 mg/hr (10/31/24 0711)   potassium PHOSPHATE  IVPB (in mmol)       LOS: 5 days  MDM: Patient is high risk for one or more organ failure.  They necessitate ongoing hospitalization for continued IV therapies and subsequent lab monitoring. Total time spent interpreting labs and vitals, reviewing the  medical record, coordinating care amongst consultants and care team members, directly assessing and discussing care with the patient and/or family: 55 min Niyah Mamaril, DO [facility]  To contact the attending physician between 7A-7P please use Epic Chat. To contact the covering physician during after hours 7P-7A, please review Amion.  10/31/2024, 8:32 AM   *This document has been created with the assistance of dictation software. Please excuse typographical errors. *   "

## 2024-10-31 NOTE — Progress Notes (Signed)
"  °  Progress Note  Patient Name: Carla Hanson Date of Encounter: 10/31/2024 [facility] Cardiologist: Maude Emmer, MD new  Interval Summary   No further chest discomfort.  Echocardiogram this admission shows EF of 65%, mildly enlarged RV, large pleural effusion on the left side, plethoric IVC. She converted at about 5 AM 10/30/2024 with both IV amiodarone  and IV diltiazem . Poor appetite.    Vital Signs Vitals:   10/31/24 0400 10/31/24 0500 10/31/24 0600 10/31/24 1006  BP: 119/69 118/67 107/75   Pulse:      Resp: 13 (!) 28 20   Temp:      TempSrc:      SpO2:    98%  Weight:      Height:        Intake/Output Summary (Last 24 hours) at 10/31/2024 1059 Last data filed at 10/31/2024 0600 Gross per 24 hour  Intake 744.72 ml  Output 200 ml  Net 544.72 ml      10/20/2024    3:48 PM 10/20/2024    8:43 AM 09/22/2024    1:16 PM  Last 3 Weights  Weight (lbs) 204 lb 2.3 oz -- 213 lb  Weight (kg) 92.6 kg -- 96.616 kg      Telemetry/ECG  Currently sinus rhythm, 70-90- Personally Reviewed  Physical Exam  GEN: No acute distress.   Neck: No JVD Cardiac: RRR, no murmurs, rubs, or gallops.  Respiratory: Clear to auscultation bilaterally. GI: Soft, nontender, non-distended  MS: Large bruises noted, unchanged  Assessment & Plan  58 year old with severe pancytopenia in the setting of multiple myeloma chemotherapy with subsequent atrial flutter RVR.  Atrial flutter/fibrillation RVR - Currently on IV amiodarone .  Challenging to rate control.  Received a one-time dose of IV digoxin  500 mcg .  Change to p.o. amiodarone  short course (perhaps one month) 200 mg twice a day as well as diltiazem  long-acting 240 mg daily. - Converted at approximately 5 AM to 10/30/2024 after 30 minutes of atrial fibrillation after extended period of atrial flutter. - Not a candidate for anticoagulation given her severe secondary pancytopenia and risk of repeat intracranial hemorrhage.  Severe  pancytopenia prior ICH - Not a candidate for anticoagulation.  Bruising noted.  Platelets in the 20,000 range.  For questions or updates, please contact [facility] Please consult [email or web address] for contact info under         Signed, Oneil Parchment, MD   "

## 2024-10-31 NOTE — Progress Notes (Signed)
 Physical Therapy Treatment Patient Details Name: Carla Hanson MRN: [id number] DOB: 01/22/1966 Today's Date: 10/31/2024   History of Present Illness 58 y.o. female was found in the hematologist office with a hemoglobin of 5.3. Pt with past medical history of DVT on aspirin  only due to intracranial hemorrhage while on Xarelto , essential hypertension, multiple myeloma on chemotherapy comes in with generalized weakness    PT Comments  Pt supine in bed on arrival.  She reports B knees feeling sore after standing with NT and RN this am.  Pt agreeable to bed level exercises this session.  Post exercises, assisted patient in rolling for bed linen change secondary to urinary incontinence.  Pt is very painful with any and all mobilization from bed level.  Pt positioned for comfort post session and B heat packs applied to B knees.      If plan is discharge home, recommend the following: A lot of help with walking and/or transfers;A lot of help with bathing/dressing/bathroom;Assistance with cooking/housework;Assist for transportation;Help with stairs or ramp for entrance   Can travel by private vehicle     No  Equipment Recommendations  None recommended by PT    Recommendations for Other Services       Precautions / Restrictions Precautions Precautions: Fall Recall of Precautions/Restrictions: Intact Precaution/Restrictions Comments: denies h/o falls in past 6 months Restrictions Weight Bearing Restrictions Per Provider Order: No     Mobility  Bed Mobility Overal bed mobility: Needs Assistance Bed Mobility: Rolling Rolling: Max assist, Mod assist         General bed mobility comments: mod to max assistance to roll in both directions this session to remove bedding and perform pericare this session.  Pt very painful with assistance around her hips knee and bottocks when assisting into rolling.  Performed boosting in supine x 2 with bed in trended position and use of head board and B feet  in hooklying to move to Head of Bed.    Transfers                   General transfer comment: Pt reports standing this am with tech and RN and refused OOB mobility at this time.    Ambulation/Gait                   Stairs             Wheelchair Mobility     Tilt Bed    Modified Rankin (Stroke Patients Only)       Balance                                            Communication Communication Communication: No apparent difficulties  Cognition Arousal: Alert Behavior During Therapy: WFL for tasks assessed/performed   PT - Cognitive impairments: No apparent impairments                         Following commands: Intact      Cueing Cueing Techniques: Verbal cues  Exercises General Exercises - Lower Extremity Ankle Circles/Pumps: AROM, Both, 10 reps, Supine Quad Sets: AROM, Both, 10 reps, Supine Heel Slides: Both, 10 reps, AROM, AAROM Hip ABduction/ADduction: AROM, Both, 10 reps, AAROM Straight Leg Raises: AROM, AAROM, Both, 10 reps, Supine    General Comments        Pertinent Vitals/Pain Pain  Assessment Pain Assessment: Faces Faces Pain Scale: Hurts whole lot Pain Location: B hips, buttocks, sacrum and B knees with rolling and hand placement from PTA to assist with rolling in supine. Pain Descriptors / Indicators: Discomfort, Grimacing, Guarding, Sore, Sharp Pain Intervention(s): Heat applied (heat applied to B knees.)    Home Living                          Prior Function            PT Goals (current goals can now be found in the care plan section) Acute Rehab PT Goals Patient Stated Goal: To get into dry linen PT Goal Formulation: With patient Potential to Achieve Goals: Good Progress towards PT goals: Progressing toward goals    Frequency    Min 3X/week      PT Plan      Co-evaluation              AM-PAC PT 6 Clicks Mobility   Outcome Measure  Help needed turning  from your back to your side while in a flat bed without using bedrails?: A Lot Help needed moving from lying on your back to sitting on the side of a flat bed without using bedrails?: A Lot Help needed moving to and from a bed to a chair (including a wheelchair)?: A Lot Help needed standing up from a chair using your arms (e.g., wheelchair or bedside chair)?: Total Help needed to walk in hospital room?: Total Help needed climbing 3-5 steps with a railing? : Total 6 Click Score: 9    End of Session Equipment Utilized During Treatment: Gait belt Activity Tolerance: Patient limited by pain Patient left: in bed;with bed alarm set;with call bell/phone within reach Nurse Communication: Mobility status PT Visit Diagnosis: Difficulty in walking, not elsewhere classified (R26.2);Other abnormalities of gait and mobility (R26.89);Muscle weakness (generalized) (M62.81)     Time: 8698-8674 PT Time Calculation (min) (ACUTE ONLY): 24 min  Charges:    $Therapeutic Exercise: 8-22 mins $Therapeutic Activity: 8-22 mins PT General Charges $$ ACUTE PT VISIT: 1 Visit                     Carla Hanson , PTA Acute Rehabilitation Services Office [phone]    Carla Hanson 10/31/2024, 1:37 PM

## 2024-11-01 ENCOUNTER — Inpatient Hospital Stay

## 2024-11-01 ENCOUNTER — Ambulatory Visit: Admitting: Physical Therapy

## 2024-11-01 DIAGNOSIS — D649 Anemia, unspecified: Secondary | ICD-10-CM | POA: Diagnosis not present

## 2024-11-01 DIAGNOSIS — R0600 Dyspnea, unspecified: Secondary | ICD-10-CM | POA: Diagnosis not present

## 2024-11-01 DIAGNOSIS — C9000 Multiple myeloma not having achieved remission: Secondary | ICD-10-CM | POA: Diagnosis not present

## 2024-11-01 DIAGNOSIS — I4892 Unspecified atrial flutter: Secondary | ICD-10-CM | POA: Diagnosis not present

## 2024-11-01 LAB — COMPREHENSIVE METABOLIC PANEL WITH GFR
ALT: 7 U/L (ref 0–44)
AST: 174 U/L — ABNORMAL HIGH (ref 15–41)
Albumin: 2.7 g/dL — ABNORMAL LOW (ref 3.5–5.0)
Alkaline Phosphatase: 156 U/L — ABNORMAL HIGH (ref 38–126)
Anion gap: 7 (ref 5–15)
BUN: 10 mg/dL (ref 6–20)
CO2: 25 mmol/L (ref 22–32)
Calcium: 9.4 mg/dL (ref 8.9–10.3)
Chloride: 106 mmol/L (ref 98–111)
Creatinine, Ser: 0.5 mg/dL (ref 0.44–1.00)
GFR, Estimated: >60 mL/min
Glucose, Bld: 95 mg/dL (ref 70–99)
Potassium: 4.7 mmol/L (ref 3.5–5.1)
Sodium: 139 mmol/L (ref 135–145)
Total Bilirubin: 0.8 mg/dL (ref 0.0–1.2)
Total Protein: 6.8 g/dL (ref 6.5–8.1)

## 2024-11-01 LAB — PREPARE RBC (CROSSMATCH)

## 2024-11-01 LAB — CBC
HCT: 22.3 % — ABNORMAL LOW (ref 36.0–46.0)
Hemoglobin: 6.8 g/dL — CL (ref 12.0–15.0)
MCH: 27.5 pg (ref 26.0–34.0)
MCHC: 30.5 g/dL (ref 30.0–36.0)
MCV: 90.3 fL (ref 80.0–100.0)
Platelets: 17 K/uL — CL (ref 150–400)
RBC: 2.47 MIL/uL — ABNORMAL LOW (ref 3.87–5.11)
RDW: 16.8 % — ABNORMAL HIGH (ref 11.5–15.5)
WBC: 8.3 K/uL (ref 4.0–10.5)
nRBC: 15.1 % — ABNORMAL HIGH (ref 0.0–0.2)

## 2024-11-01 LAB — PHOSPHORUS: Phosphorus: 3.6 mg/dL (ref 2.5–4.6)

## 2024-11-01 LAB — MAGNESIUM: Magnesium: 1.7 mg/dL (ref 1.7–2.4)

## 2024-11-01 MED ORDER — SODIUM CHLORIDE 0.9% IV SOLUTION: Freq: Once | INTRAVENOUS | Status: AC

## 2024-11-01 MED ORDER — HYDROCODONE-ACETAMINOPHEN 5-325 MG PO TABS
1.0000 | ORAL_TABLET | Freq: Four times a day (QID) | ORAL | Status: DC | PRN
  Administered 2024-11-01 – 2024-11-04 (×6): 1 via ORAL
  Filled 2024-11-01 (×6): qty 1

## 2024-11-01 MED ORDER — MORPHINE SULFATE (PF) 2 MG/ML IV SOLN
2.0000 mg | INTRAVENOUS | Status: DC | PRN
  Administered 2024-11-01 – 2024-11-02 (×2): 2 mg via INTRAVENOUS
  Filled 2024-11-01 (×2): qty 1

## 2024-11-01 NOTE — Plan of Care (Signed)
  Problem: Clinical Measurements: Goal: Will remain free from infection Outcome: Progressing Goal: Respiratory complications will improve Outcome: Progressing Goal: Cardiovascular complication will be avoided Outcome: Progressing   Problem: Activity: Goal: Risk for activity intolerance will decrease Outcome: Progressing   Problem: Nutrition: Goal: Adequate nutrition will be maintained Outcome: Progressing   Problem: Coping: Goal: Level of anxiety will decrease Outcome: Progressing   Problem: Elimination: Goal: Will not experience complications related to urinary retention Outcome: Progressing   Problem: Pain Managment: Goal: General experience of comfort will improve and/or be controlled Outcome: Progressing   Problem: Safety: Goal: Ability to remain free from injury will improve Outcome: Progressing   Problem: Skin Integrity: Goal: Risk for impaired skin integrity will decrease Outcome: Progressing

## 2024-11-01 NOTE — Progress Notes (Addendum)
"  °  Progress Note  Patient Name: Carla Hanson Date of Encounter: 11/01/2024 [facility] Cardiologist: Maude Emmer, MD   Interval Summary   Resting comfortably in bed, reports no acute symptoms  Remains in sinus with rates in the 80s  Hemoglobin 6.8 this AM -- primary ordering 1 unit pRBC  Vital Signs Vitals:   10/31/24 2005 10/31/24 2026 10/31/24 2032 11/01/24 0500  BP:   104/69 113/68  Pulse:      Resp:  20 (!) 22 (!) 24  Temp:  98.3 F (36.8 C)  97.9 F (36.6 C)  TempSrc:  Oral  Oral  SpO2: 97%  98% 99%  Weight:      Height:        Intake/Output Summary (Last 24 hours) at 11/01/2024 0935 Last data filed at 11/01/2024 9357 Gross per 24 hour  Intake 549.43 ml  Output 1200 ml  Net -650.57 ml      10/20/2024    3:48 PM 10/20/2024    8:43 AM 09/22/2024    1:16 PM  Last 3 Weights  Weight (lbs) 204 lb 2.3 oz -- 213 lb  Weight (kg) 92.6 kg -- 96.616 kg     Telemetry/ECG  Sinus rhythm, HR 80s - Personally Reviewed  Physical Exam  GEN: No acute distress.   Neck: No JVD Cardiac: RRR, no murmurs, rubs, or gallops.  Respiratory: Clear to auscultation bilaterally. GI: Soft, nontender, non-distended  MS: No edema  Assessment & Plan   Atrial fibrillation/flutter with RVR, new onset Has a difficult time with rate control this admission Given IV digoxin  500 mcg x 1 dose Started on PO amiodarone , plans for short course Currently in sinus with heart rate 80s TSH normal Currently on amiodarone  200 mg twice daily (plans to continue this for 1 month), diltiazem  240 mg daily Not a candidate for anticoagulation given severe secondary pancytopenia (platelets 17k, hemoglobin 6.8 today) , history of intracranial hemorrhages  Per primary Symptomatic anemia Pancytopenia Multiple myeloma Pleural effusion Electrolyte disturbances Hypertension History of PE/DVT   For questions or updates, please contact [facility] Please consult [email or web address] for  contact info under   Signed, Waddell DELENA Donath, PA-C   Personally seen and examined. Agree with above.  Hemoglobin 6.8-1 unit packed red blood cells, multiple myeloma, chemotherapy, pancytopenia  Atrial fibrillation/flutter new onset with recent conversion now on p.o. amiodarone  hopefully for short course.  Currently on amiodarone  2 mg twice a day.  Also on diltiazem  240 a day.  No anticoagulation.  Perhaps keep amiodarone  for 1 month.  Oneil Parchment, MD  "

## 2024-11-01 NOTE — Progress Notes (Signed)
 " PROGRESS NOTE    Carla Hanson  FMW:[id number] DOB: [birth year] DOA: 10/20/2024 PCP: Anders Otto DASEN, MD  Chief Complaint  Patient presents with   low hemoglobin     Hospital Course:  Carla Hanson is a 58 year old female with tree of DVT only on aspirin  due to intracranial hemorrhage she will on Xarelto , hypertension, multiple myeloma on chemotherapy comes in with generalized weakness found to have symptomatic anemia with a hemoglobin of 5.3 as well as a large bruise on her right upper extremity.  She received 2 units PRBCs 12/17 and another 2 units 12/18.  Medical oncology was consulted.  Patient underwent bone marrow biopsy 12/24. Hospital course further complicated by a flutter with RVR.  Cardiology was consulted and patient was started on Cardizem  and then amiodarone .  She converted to NSR in 12/27.  On 12/28 she was transitioned to p.o. medications.  Subjective: No acute events overnight. Hemoglobin has downtrended again today.  Receiving another unit PRBC Patient complains of bilateral knee pain today.  Denies any trauma.   Her mother is at bedside today.  Objective: Vitals:   11/01/24 0500 11/01/24 1000 11/01/24 1044 11/01/24 1330  BP: 113/68  110/69 (!) 99/54  Pulse:  84 75 76  Resp: (!) 24  20   Temp: 97.9 F (36.6 C) 98.3 F (36.8 C) 98.5 F (36.9 C) 98.2 F (36.8 C)  TempSrc: Oral Oral Oral Oral  SpO2: 99%     Weight:      Height:        Intake/Output Summary (Last 24 hours) at 11/01/2024 1429 Last data filed at 11/01/2024 1344 Gross per 24 hour  Intake 1151.43 ml  Output 1200 ml  Net -48.57 ml   Filed Weights   10/20/24 1548  Weight: 92.6 kg    Examination: General exam: Appears calm and comfortable, NAD, chronically ill appearing Respiratory system: No work of breathing, symmetric chest wall expansion Cardiovascular system: S1 & S2 heard, RRR.  Gastrointestinal system: Abdomen is nondistended, soft and nontender.  Neuro: Alert and  oriented Extremities: Knees are very tender to the touch, no obvious ecchymosis, swelling, or erythema  Assessment & Plan:  Principal Problem:   Symptomatic anemia Active Problems:   Essential hypertension   History of pulmonary embolism   Iron deficiency anemia   Anemia   Hypercalcemia of malignancy   Multiple myeloma not having achieved remission (HCC)   Folate deficiency   Anemia due to antineoplastic agent   Counseling regarding advance care planning and goals of care   Thrombocytopenia   Multiple myeloma Symptomatic anemia Thrombocytopenia Iron deficiency anemia Folate deficiency - Status post 5 units PRBC since arrival - Bone marrow biopsy 12/24.  Pathology report available.  Awaiting oncology input.  Followed by Dr. Onesimo with med-onc - Continue trending CBCs.  Hemoglobin continues to downtrend.  Goal to keep hemoglobin above 8.  Receiving another transfusion today - Platelets also downtrending but more stable.  Transfuse if counts fall below 10,000.   - Continue with acyclovir , folic acid , thiamine , ferrous sulfate   A-flutter with RVR - New diagnosis - TSH WNL - Cardiology consulted.  Initially on Cardizem  then amiodarone .  Received digoxin  one-time dose.  Rate has been difficult to control - Was on IV amiodarone  and IV diltiazem .  Have transitioned to p.o. now patient appears to be stable. Cardiology recommends short course of p.o. amiodarone  200 mg twice daily x 30 days, addition to diltiazem  long-acting 240 mg daily - NSR now - Echocardiogram:  LVEF 60 to 65%, no RWMA, diastolic parameters indeterminate.  Right ventricle mildly enlarged.  Mildly elevated pulmonary artery systolic pressure.  Large pleural effusion to the left lateral region significant valvular disease noted.  Effusion not noted on CXR after echo - Not a candidate for anticoagulation due to severe thrombocytopenia and history of prior intracranial hemorrhage  Pleural effusion - Has received Lasix  with  some improvement.  Not currently receiving due to soft blood pressures - Pleural effusion seems large on echocardiogram, follow-up CXR shows very small effusions. - Continue with as needed IV Lasix .  Not needed today.  Dyspnea - O2 sats remained WNL - CXR with small bilateral pleural effusions and nonspecific opacities likely atelectasis - Remains without cough or fever.  Unlikely to be pneumonia - Did receive Lasix  in the last 2 days with some improvement in her symptoms.  Continue with additional Lasix  as needed - On Breztri .  She reports significant improvement with this.  Does take albuterol  outpatient as needed - Continue with incentive spirometry - No indication for antibiotics at this time  Generalized deconditioning Weakness - Overall patient's functional status appears poor.  Patient has had difficulty pursuing aggressive course of cancer treatment outpatient due to her comorbid conditions and some social issues. - Continue with PT/OT.  Anticipate she will need SNF at DC - Palliative consult  Hypercalcemia of malignancy - On calcitonin, received Zometa  in October - Calcium  levels improving - Heme-onc following  Hypokalemia Hypomagnesemia Hypophosphatemia - Continue trending and replace as needed  Hypertension - Meant to be on ARB, currently held due to hypotension.  Follow BPs closely and resume as able.  Titrate  History of pulmonary embolism History of DVT - Not on anticoagulation due to history of intracranial hemorrhage on Xarelto  - On aspirin  outpatient.  Currently holding secondary to thrombocytopenia.  Body mass index is 37.95 kg/m. Obesity Class 2 - Outpatient follow up for lifestyle modification and risk factor management  Generalized weakness - Secondary to her multiple comorbidities as above. - PT/OT consulted.  Will need SNF.  TOC consulted for placement   DVT prophylaxis: SCDs only.  Hold anticoagulation given thrombocytopenia.   Code Status: Full  Code Disposition: Anticipate she is nearing medical readiness.  Will need SNF at DC.  TOC consulted for these arrangements  Consultants:  Treatment Team:  Consulting Physician: Onesimo Emaline Brink, MD  Procedures:    Antimicrobials:  Anti-infectives (From admission, onward)    Start     Dose/Rate Route Frequency Ordered Stop   10/26/24 1145  acyclovir  (ZOVIRAX ) tablet 400 mg        400 mg Oral 2 times daily 10/26/24 1048         Data Reviewed: I have personally reviewed following labs and imaging studies CBC: Recent Labs  Lab 10/27/24 0458 10/28/24 0500 10/29/24 0434 10/30/24 0546 10/31/24 0546 11/01/24 0415  WBC 9.8 10.2 9.9 9.1 8.5 8.3  NEUTROABS 3.9  --   --   --   --   --   HGB 8.1* 7.9* 9.0* 8.6* 7.1* 6.8*  HCT 25.2* 24.9* 28.4* 27.6* 22.0* 22.3*  MCV 87.2 87.4 87.7 89.0 87.6 90.3  PLT 19* 18* 23* 22* 18* 17*   Basic Metabolic Panel: Recent Labs  Lab 10/28/24 0500 10/29/24 0434 10/30/24 0546 10/31/24 0546 11/01/24 0415  NA 143 142 141 141 139  K 2.9* 3.7 3.5 4.1 4.7  CL 106 106 104 106 106  CO2 24 25 22 29 25   GLUCOSE 97 110*  133* 103* 95  BUN 8 9 12 9 10   CREATININE 0.41* 0.46 0.58 0.56 0.50  CALCIUM  9.9 10.1 10.0 10.0 9.4  MG 2.1 2.3 2.0 1.8 1.7  PHOS  --   --  2.4* 2.5 3.6   GFR: Estimated Creatinine Clearance: 80.3 mL/min (by C-G formula based on SCr of 0.5 mg/dL). Liver Function Tests: Recent Labs  Lab 10/26/24 0502 11/01/24 0415  AST 34 174*  ALT 6 7  ALKPHOS 141* 156*  BILITOT 0.8 0.8  PROT 7.2 6.8  ALBUMIN 2.8* 2.7*   CBG: No results for input(s): GLUCAP in the last 168 hours.  No results found for this or any previous visit (from the past 240 hours).   Radiology Studies: DG CHEST PORT 1 VIEW Result Date: 10/31/2024 CLINICAL DATA:  Pleural effusion.  Multiple myeloma. EXAM: PORTABLE CHEST 1 VIEW COMPARISON:  10/26/2024 FINDINGS: Stable cardiomegaly and pulmonary vascular congestion. Stable mild pleural thickening or tiny  pleural effusions. No significant change in left lower lobe atelectasis or scarring. No new or worsening areas of pulmonary opacity are seen. IMPRESSION: Stable cardiomegaly and pulmonary vascular congestion. Stable left lower lobe atelectasis or scarring. Stable tiny bilateral pleural effusions versus pleural thickening. Electronically Signed   By: Norleen DELENA Kil M.D.   On: 10/31/2024 09:54    Scheduled Meds:  acyclovir   400 mg Oral BID   amiodarone   200 mg Oral BID   budesonide -glycopyrrolate -formoterol   2 puff Inhalation BID   calcitonin (salmon)  1 spray Alternating Nares Daily   diltiazem   240 mg Oral Daily   ferrous sulfate   325 mg Oral Q breakfast   folic acid   1 mg Oral BID   loratadine   10 mg Oral QHS   pantoprazole   40 mg Oral Q0600   polyethylene glycol  17 g Oral Daily   potassium chloride   40 mEq Oral BID   senna-docusate  2 tablet Oral BID   thiamine   100 mg Oral Daily   Continuous Infusions:     LOS: 6 days  MDM: Patient is high risk for one or more organ failure.  They necessitate ongoing hospitalization for continued IV therapies and subsequent lab monitoring. Total time spent interpreting labs and vitals, reviewing the medical record, coordinating care amongst consultants and care team members, directly assessing and discussing care with the patient and/or family: 55 min Shealynn Saulnier, DO [facility]  To contact the attending physician between 7A-7P please use Epic Chat. To contact the covering physician during after hours 7P-7A, please review Amion.  11/01/2024, 2:29 PM   *This document has been created with the assistance of dictation software. Please excuse typographical errors. *   "

## 2024-11-02 DIAGNOSIS — R52 Pain, unspecified: Secondary | ICD-10-CM | POA: Diagnosis not present

## 2024-11-02 DIAGNOSIS — I4892 Unspecified atrial flutter: Secondary | ICD-10-CM | POA: Diagnosis not present

## 2024-11-02 DIAGNOSIS — Z7189 Other specified counseling: Secondary | ICD-10-CM

## 2024-11-02 DIAGNOSIS — Z86711 Personal history of pulmonary embolism: Secondary | ICD-10-CM

## 2024-11-02 DIAGNOSIS — Z515 Encounter for palliative care: Secondary | ICD-10-CM | POA: Diagnosis not present

## 2024-11-02 DIAGNOSIS — R0600 Dyspnea, unspecified: Secondary | ICD-10-CM | POA: Diagnosis not present

## 2024-11-02 DIAGNOSIS — C9000 Multiple myeloma not having achieved remission: Secondary | ICD-10-CM | POA: Diagnosis not present

## 2024-11-02 DIAGNOSIS — D649 Anemia, unspecified: Secondary | ICD-10-CM | POA: Diagnosis not present

## 2024-11-02 DIAGNOSIS — D696 Thrombocytopenia, unspecified: Secondary | ICD-10-CM | POA: Diagnosis not present

## 2024-11-02 DIAGNOSIS — R4589 Other symptoms and signs involving emotional state: Secondary | ICD-10-CM | POA: Diagnosis not present

## 2024-11-02 LAB — TYPE AND SCREEN
ABO/RH(D): A POS
Antibody Screen: NEGATIVE
Unit division: 0

## 2024-11-02 LAB — COMPREHENSIVE METABOLIC PANEL WITH GFR
ALT: 10 U/L (ref 0–44)
AST: 323 U/L — ABNORMAL HIGH (ref 15–41)
Albumin: 2.8 g/dL — ABNORMAL LOW (ref 3.5–5.0)
Alkaline Phosphatase: 235 U/L — ABNORMAL HIGH (ref 38–126)
Anion gap: 10 (ref 5–15)
BUN: 11 mg/dL (ref 6–20)
CO2: 24 mmol/L (ref 22–32)
Calcium: 8.9 mg/dL (ref 8.9–10.3)
Chloride: 105 mmol/L (ref 98–111)
Creatinine, Ser: 0.5 mg/dL (ref 0.44–1.00)
GFR, Estimated: >60 mL/min
Glucose, Bld: 102 mg/dL — ABNORMAL HIGH (ref 70–99)
Potassium: 4.3 mmol/L (ref 3.5–5.1)
Sodium: 140 mmol/L (ref 135–145)
Total Bilirubin: 0.7 mg/dL (ref 0.0–1.2)
Total Protein: 7.2 g/dL (ref 6.5–8.1)

## 2024-11-02 LAB — MAGNESIUM: Magnesium: 1.8 mg/dL (ref 1.7–2.4)

## 2024-11-02 LAB — CBC
HCT: 25.8 % — ABNORMAL LOW (ref 36.0–46.0)
Hemoglobin: 8.3 g/dL — ABNORMAL LOW (ref 12.0–15.0)
MCH: 28.1 pg (ref 26.0–34.0)
MCHC: 32.2 g/dL (ref 30.0–36.0)
MCV: 87.5 fL (ref 80.0–100.0)
Platelets: 18 K/uL — CL (ref 150–400)
RBC: 2.95 MIL/uL — ABNORMAL LOW (ref 3.87–5.11)
RDW: 16.2 % — ABNORMAL HIGH (ref 11.5–15.5)
WBC: 8.2 K/uL (ref 4.0–10.5)
nRBC: 17.5 % — ABNORMAL HIGH (ref 0.0–0.2)

## 2024-11-02 LAB — PHOSPHORUS: Phosphorus: 3.5 mg/dL (ref 2.5–4.6)

## 2024-11-02 LAB — BPAM RBC
Blood Product Expiration Date: 202601242359
ISSUE DATE / TIME: 202512291018
Unit Type and Rh: 6200

## 2024-11-02 NOTE — Progress Notes (Addendum)
"  °  Progress Note  Patient Name: Carla Hanson Date of Encounter: 11/02/2024 [facility] Cardiologist: Maude Emmer, MD   Interval Summary   Received packed red blood cells yesterday.  Fairly comfortable in bed.  Mildly increased work of breathing.  Vital Signs Vitals:   11/01/24 1550 11/01/24 2000 11/02/24 0500 11/02/24 0800  BP:  (!) 153/68 132/67 135/67  Pulse: 76     Resp:      Temp: 98.2 F (36.8 C) 98.4 F (36.9 C) 98.3 F (36.8 C)   TempSrc: Oral Oral Oral   SpO2: 99% 98% 97%   Weight:      Height:        Intake/Output Summary (Last 24 hours) at 11/02/2024 0811 Last data filed at 11/02/2024 0540 Gross per 24 hour  Intake 602 ml  Output 600 ml  Net 2 ml      10/20/2024    3:48 PM 10/20/2024    8:43 AM 09/22/2024    1:16 PM  Last 3 Weights  Weight (lbs) 204 lb 2.3 oz -- 213 lb  Weight (kg) 92.6 kg -- 96.616 kg     Telemetry/ECG  Converted to sinus rhythm earlier on this admit.  Currently in the 80s-90s- Personally Reviewed  Physical Exam  GEN: Ill-appearing Neck: No JVD Cardiac: RRR, no murmurs, rubs, or gallops.  Respiratory: Mildly increased work of breathing GI: Soft, nontender, non-distended  MS: Large bruising noted on extremities  Assessment & Plan   Atrial fibrillation/flutter with RVR, new onset Has a difficult time with rate control this admission Given IV digoxin  500 mcg x 1 dose Started on PO amiodarone , plans for short course Currently in sinus with heart rate 90s TSH normal Currently on amiodarone  200 mg twice daily (plans to continue this for 1 month), diltiazem  240 mg daily Not a candidate for anticoagulation given severe secondary pancytopenia (platelets 18k, hemoglobin 8.3 today) , history of intracranial hemorrhages   Per primary Symptomatic anemia Pancytopenia Multiple myeloma Pleural effusion Electrolyte disturbances Hypertension History of PE/DVT  No changes in therapy at this time.  We will go ahead and sign  off.  Please let us  know we can be of further assistance.  For questions or updates, please contact [facility] Please consult [email or web address] for contact info under   Signed, Oneil Parchment, MD   "

## 2024-11-02 NOTE — Consult Note (Signed)
 " Consultation Note Date: 11/02/2024   Patient Name: Carla Hanson  DOB: 01-29-1966  MRN: [id number]  Age / Sex: 58 y.o., female   PCP: Anders Otto DASEN, MD Referring Physician: Leesa Kast, DO  Reason for Consultation: Establishing goals of care     Chief Complaint/History of Present Illness:   Patient is a 58 year old female with a past medical history of DVT only on aspirin  due to prior intracranial hemorrhage, hypertension, and multiple myeloma who was admitted on 10/20/2024 for generalized weakness and found to have symptomatic anemia.  During hospitalization, has received management for symptomatic anemia and thrombocytopenia with transfusions.  Hospitalization also complicated by a flutter with RVR for which cardiology was consulted for recommendations.  Palliative medicine team consulted to assist with complex medical decision making.  Reviewed EMR including recent documentation from hospitalist and cardiologist.  Reviewed recent CMP noting albumin low at 2.8.  Alk phos elevated at 235 and AST elevated at 323.  Reviewed recent CBC noting hemoglobin has improved to 8.3 after transfusion.  Platelets remain low at 18.  Reviewed recent chest x-ray noting stable tiny bilateral pleural effusions. Discussed care with hospitalist and oncology team/on-call oncologist for updates.  Was previously stated by oncology that there may be a plan for referral to [facility] for consideration of BITE therapy.  Patient did have repeat bone marrow biopsy performed on 10/27/2024.  Presented to bedside to meet with patient.  RN at bedside able to provide updates.  Patient laying comfortably in bed without visitors present.  Introduced myself as a member of the palliative medicine team and my role in patient's medical journey.  Spent time learning about patient's medical care up into this point.  Patient able to voice she is currently receiving transfusions as needed for management of her low blood counts.   Patient did inquire why her oncologist, Dr. Onesimo, has not been to see her.  Discussed that he has been on vacation though other oncologist who are his partners have provided recommendations as needed.  Inquired about care planning moving forward.  Patient noted that she is motivated to participate with PT/OT to regain her strength to continue with cancer directed therapies that are offered.  Discussed possible referral to [facility] and patient noted that had previously been mentioned by Dr. Onesimo.  She would be willing to pursue this.  Patient notes that it is her mother who helps with getting her to doctors appointments as needed.  Inquired about other family support.  Patient notes she has 3 daughters and 1 son.  Patient notes that they are located in the [place] area.  Inquired if patient has previously completed ACP documentation and she noted that she has not.  Patient stated if she was unable to make medical decisions for herself she would want her mother and eldest daughter, Cherise, to make medical decisions on her behalf.  Encouraged completion of ACP documentation if she wishes for these people to be HCPOA/primary alternate.  Inquired about CODE STATUS and patient noted that providers have already asked her about this and she has stated that she would like to be full code at this time.  Acknowledged her wishes regarding this.  Inquired about symptom burden at this time.  Patient feels that her primary concern has been bilateral knee pain after working with physical therapy and right hip pain from the biopsy.  Patient has as needed Norco ordered and feels that it is working for her pain as needed.  Noted can  make adjustments if required.  Did inquire about patient's motivation and mood and patient feels that she is overall doing all right.  Patient notes that while she can be down at sometimes, she overall feels she is doing well and wants to continue to regain strength so then she can follow-up  for cancer therapies after rehab.  Acknowledged this.  Provided emotional support via active listening.  All questions answered at that time.  Noted palliative medicine team will continue to follow along with patient's medical journey.  Discussed care with hospitalist, bedside RN, and TOC to coordinate care after visit.  Primary Diagnoses  Present on Admission:  Symptomatic anemia  History of pulmonary embolism  Essential hypertension  Iron deficiency anemia  Multiple myeloma not having achieved remission (HCC)  Hypercalcemia of malignancy  Folate deficiency  Anemia   Palliative Review of Systems: Bilateral knee pain and right hip pain improved with medications  Past Medical History:  Diagnosis Date   Alcohol  abuse    Anxiety    Back pain    Bipolar disorder (HCC)    Bronchitis    COPD exacerbation (HCC) 05/10/2016   Drug use    Edema 02/24/2024   History of blood clots    Hyperlipidemia    Hypertension    Influenza A 12/01/2018   Joint pain    Left leg DVT (HCC) 09/29/2013   Lipoma    Abdomen   LIPOMA 01/20/2008   Obesity    Occasional tremors 05/12/2019   Osteoarthritis of right knee    Other fatigue    Painful menstrual periods 01/05/2012   Pneumonia    Prediabetes    Seizure (HCC)    Shortness of breath on exertion    Stroke (HCC)    Tobacco abuse    Social History   Socioeconomic History   Marital status: Divorced    Spouse name: Not on file   Number of children: 4   Years of education: 10   Highest education level: Not on file  Occupational History   Occupation: stay at home  Tobacco Use   Smoking status: Former    Current packs/day: 0.00    Average packs/day: 0.5 packs/day for 35.0 years (17.5 ttl pk-yrs)    Types: Cigarettes    Start date: 11/04/1981    Quit date: 10/2016    Years since quitting: 8.0   Smokeless tobacco: Never  Vaping Use   Vaping status: Never Used  Substance and Sexual Activity   Alcohol  use: No    Alcohol /week: 0.0  standard drinks of alcohol     Comment: Quit 10/2016   Drug use: Yes    Types: Marijuana   Sexual activity: Not Currently    Comment: tubal  Other Topics Concern   Not on file  Social History Narrative   Unemployed, 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  Considering nursing.    Lives at home alone   Right-handed   Caffeine: not much   Social Drivers of Health   Tobacco Use: Medium Risk (10/20/2024)   Patient History    Smoking Tobacco Use: Former    Smokeless Tobacco Use: Never    Passive Exposure: Not on file  Financial Resource Strain: Low Risk (05/14/2024)   Overall Financial Resource Strain (CARDIA)    Difficulty of Paying Living Expenses: Not hard at all  Food Insecurity: No Food Insecurity (10/20/2024)   Epic    Worried About  Running Out of Food in the Last Year: Never true    Ran Out of Food in the Last Year: Never true  Transportation Needs: No Transportation Needs (10/20/2024)   Epic    Lack of Transportation (Medical): No    Lack of Transportation (Non-Medical): No  Physical Activity: Insufficiently Active (05/14/2024)   Exercise Vital Sign    Days of Exercise per Week: 3 days    Minutes of Exercise per Session: 20 min  Stress: No Stress Concern Present (05/14/2024)   [employer] of Occupational Health - Occupational Stress Questionnaire    Feeling of Stress: Not at all  Social Connections: Moderately Integrated (10/20/2024)   Social Connection and Isolation Panel    Frequency of Communication with Friends and Family: More than three times a week    Frequency of Social Gatherings with Friends and Family: More than three times a week    Attends Religious Services: More than 4 times per year    Active Member of Clubs or Organizations: Yes    Attends Banker Meetings: More than 4 times per year    Marital Status: Divorced  Depression (PHQ2-9): Low Risk (09/08/2024)    Depression (PHQ2-9)    PHQ-2 Score: 1  Alcohol  Screen: Not on file  Housing: Low Risk (10/20/2024)   Epic    Unable to Pay for Housing in the Last Year: No    Number of Times Moved in the Last Year: 0    Homeless in the Last Year: No  Utilities: Not At Risk (10/20/2024)   Epic    Threatened with loss of utilities: No  Health Literacy: Not on file   Family History  Problem Relation Age of Onset   Other Mother        High blood pressure runs in the family   Obesity Mother    Asthma Father    Heart attack Father    Obesity Father    Diabetes Maternal Aunt    Breast cancer Neg Hx    Scheduled Meds:  acyclovir   400 mg Oral BID   amiodarone   200 mg Oral BID   budesonide -glycopyrrolate -formoterol   2 puff Inhalation BID   calcitonin (salmon)  1 spray Alternating Nares Daily   diltiazem   240 mg Oral Daily   ferrous sulfate   325 mg Oral Q breakfast   folic acid   1 mg Oral BID   loratadine   10 mg Oral QHS   pantoprazole   40 mg Oral Q0600   polyethylene glycol  17 g Oral Daily   potassium chloride   40 mEq Oral BID   senna-docusate  2 tablet Oral BID   thiamine   100 mg Oral Daily   Continuous Infusions: PRN Meds:.acetaminophen  **OR** acetaminophen , ALPRAZolam , artificial tears, HYDROcodone -acetaminophen , metoprolol  tartrate, morphine  injection, ondansetron  **OR** ondansetron  (ZOFRAN ) IV, tiZANidine , traZODone  Allergies[1] CBC:    Component Value Date/Time   WBC 8.3 11/01/2024 0415   HGB 6.8 (LL) 11/01/2024 0415   HGB 5.9 (LL) 10/20/2024 0817   HGB 11.3 02/10/2024 1201   HCT 22.3 (L) 11/01/2024 0415   HCT 34.7 02/10/2024 1201   PLT 17 (LL) 11/01/2024 0415   PLT 31 (L) 10/20/2024 0817   PLT 342 02/10/2024 1201   MCV 90.3 11/01/2024 0415   MCV 84 02/10/2024 1201   NEUTROABS 3.9 10/27/2024 0458   NEUTROABS 4.6 02/10/2024 1201   LYMPHSABS 3.5 10/27/2024 0458   LYMPHSABS 2.3 02/10/2024 1201   MONOABS 1.2 (H) 10/27/2024 0458   EOSABS 0.2 10/27/2024 0458  EOSABS 0.2  02/10/2024 1201   BASOSABS 0.2 (H) 10/27/2024 0458   BASOSABS 0.0 02/10/2024 1201   Comprehensive Metabolic Panel:    Component Value Date/Time   NA 140 11/02/2024 0513   NA 145 (H) 02/24/2024 1217   K 4.3 11/02/2024 0513   CL 105 11/02/2024 0513   CO2 24 11/02/2024 0513   BUN 11 11/02/2024 0513   BUN 16 02/24/2024 1217   CREATININE 0.50 11/02/2024 0513   CREATININE 0.59 10/20/2024 0817   CREATININE 0.57 11/09/2014 1032   GLUCOSE 102 (H) 11/02/2024 0513   CALCIUM  8.9 11/02/2024 0513   AST 323 (H) 11/02/2024 0513   AST 52 (H) 10/20/2024 0817   ALT 10 11/02/2024 0513   ALT 11 10/20/2024 0817   ALKPHOS 235 (H) 11/02/2024 0513   BILITOT 0.7 11/02/2024 0513   BILITOT 0.9 10/20/2024 0817   PROT 7.2 11/02/2024 0513   PROT 8.2 02/24/2024 1217   ALBUMIN 2.8 (L) 11/02/2024 0513   ALBUMIN 3.6 (L) 02/24/2024 1217    Physical Exam: Vital Signs: BP 132/67 (BP Location: Right Wrist)   Pulse 76   Temp 98.3 F (36.8 C) (Oral)   Resp 20   Ht 5' 1.5 (1.562 m)   Wt 92.6 kg   LMP  (LMP Unknown)   SpO2 97%   BMI 37.95 kg/m  SpO2: SpO2: 97 % O2 Device: O2 Device: Nasal Cannula O2 Flow Rate: O2 Flow Rate (L/min): 2 L/min Intake/output summary:  Intake/Output Summary (Last 24 hours) at 11/02/2024 9340 Last data filed at 11/02/2024 0540 Gross per 24 hour  Intake 602 ml  Output 600 ml  Net 2 ml   LBM: Last BM Date : 11/01/24 Baseline Weight: Weight: 92.6 kg Most recent weight: Weight: 92.6 kg  General: NAD, alert chronically ill-appearing, pleasant Cardiovascular: RRR Respiratory: no increased work of breathing noted, not in respiratory distress Abdomen: not distended Skin: Large ecchymoses present on the right upper extremity Neuro: A&Ox4, following commands easily Psych: appropriately answers all questions          Palliative Performance Scale: 50%              Additional Data Reviewed: Recent Labs    10/31/24 0546 11/01/24 0415 11/02/24 0513  WBC 8.5 8.3  --    HGB 7.1* 6.8*  --   PLT 18* 17*  --   NA 141 139 140  BUN 9 10 11   CREATININE 0.56 0.50 0.50    Imaging: DG CHEST PORT 1 VIEW CLINICAL DATA:  Pleural effusion.  Multiple myeloma.  EXAM: PORTABLE CHEST 1 VIEW  COMPARISON:  10/26/2024  FINDINGS: Stable cardiomegaly and pulmonary vascular congestion. Stable mild pleural thickening or tiny pleural effusions. No significant change in left lower lobe atelectasis or scarring. No new or worsening areas of pulmonary opacity are seen.  IMPRESSION: Stable cardiomegaly and pulmonary vascular congestion.  Stable left lower lobe atelectasis or scarring. Stable tiny bilateral pleural effusions versus pleural thickening.  Electronically Signed   By: Norleen DELENA Kil M.D.   On: 10/31/2024 09:54    I personally reviewed recent imaging.   Palliative Care Assessment and Plan Summary of Established Goals of Care and Medical Treatment Preferences   Patient is a 58 year old female with a past medical history of DVT only on aspirin  due to prior intracranial hemorrhage, hypertension, and multiple myeloma who was admitted on 10/20/2024 for generalized weakness and found to have symptomatic anemia.  During hospitalization, has received management for symptomatic anemia and thrombocytopenia with  transfusions.  Hospitalization also complicated by a flutter with RVR for which cardiology was consulted for recommendations.  Palliative medicine team consulted to assist with complex medical decision making.  # Complex medical decision making/goals of care  - Discussed care with patient as detailed above in HPI.  Patient currently desiring to continue with appropriate medical therapies.  Patient would like to go to rehab to regain her strength so she can follow-up for further cancer directed therapies after completion of rehab.  Palliative medicine team continuing to follow with patient's medical journey.  -  Code Status: Full Code    - Discussed CODE STATUS  with patient as detailed above in HPI.  Patient noted that providers have already discussed CODE STATUS with her and she would like to remain full code at this time.  # Symptom management Patient is receiving these palliative interventions for symptom management with an intent to improve quality of life.   - Pain, in setting of multiple myeloma   - Receiving hydrocodone -acetaminophen  5-325 mg 1 tablet every 6 hours as needed   - Receiving as needed IV morphine  2 mg every 4 hours as needed  # Psycho-social/Spiritual Support:  - Support System: Mother, 4 children (3 daughters and 1 son)  # Discharge Planning:  To Be Determined - Placed referral for ambulatory palliative medicine follow-up at [facility]  Thank you for allowing the palliative care team to participate in the care [name].  Tinnie Radar, DO Palliative Care Provider PMT # [phone]  If patient remains symptomatic despite maximum doses, please call PMT at [phone] between 0700 and 1900. Outside of these hours, please call attending, as PMT does not have night coverage.  Billing based on MDM: High  Problems Addressed: One or more chronic illnesses with severe exacerbation, progression, or side effects of treatment.  Amount and/or Complexity of Data: Category 2:Independent interpretation of a test performed by another physician/other qualified health care professional (not separately reported) and Category 3:Discussion of management or test interpretation with external physician/other qualified health care professional/appropriate source (not separately reported)     [1]  Allergies Allergen Reactions   Latuda  [Lurasidone ] Other (See Comments)    Made leg muscles twitch (went off this herself in December, 2017)   "

## 2024-11-02 NOTE — Progress Notes (Signed)
 " PROGRESS NOTE    Carla Hanson  FMW:[id number] DOB: Sep 27, 1966 DOA: 10/20/2024 PCP: Anders Otto DASEN, MD  Chief Complaint  Patient presents with   low hemoglobin     Hospital Course:  Carla Hanson is a 58 year old female with tree of DVT only on aspirin  due to intracranial hemorrhage she will on Xarelto , hypertension, multiple myeloma on chemotherapy comes in with generalized weakness found to have symptomatic anemia with a hemoglobin of 5.3 as well as a large bruise on her right upper extremity.  She received 2 units PRBCs 12/17 and another 2 units 12/18.  Medical oncology was consulted.  Patient underwent bone marrow biopsy 12/24. Hospital course further complicated by a flutter with RVR.  Cardiology was consulted and patient was started on Cardizem  and then amiodarone .  She converted to NSR in 12/27.  On 12/28 she was transitioned to p.o. medications.  Subjective: No acute events overnight.  Discussed SNF with patient today.  She is amendable to this.  Objective: Vitals:   11/01/24 1550 11/01/24 2000 11/02/24 0500 11/02/24 0800  BP:  (!) 153/68 132/67 135/67  Pulse: 76     Resp:      Temp: 98.2 F (36.8 C) 98.4 F (36.9 C) 98.3 F (36.8 C)   TempSrc: Oral Oral Oral   SpO2: 99% 98% 97%   Weight:      Height:        Intake/Output Summary (Last 24 hours) at 11/02/2024 1542 Last data filed at 11/02/2024 1449 Gross per 24 hour  Intake --  Output 1100 ml  Net -1100 ml   Filed Weights   10/20/24 1548  Weight: 92.6 kg    Examination: General exam: Appears calm and comfortable, NAD, chronically ill appearing Respiratory system: No work of breathing, symmetric chest wall expansion Cardiovascular system: S1 & S2 heard, RRR.  Gastrointestinal system: Abdomen is nondistended, soft and nontender.  Neuro: Alert and oriented, appears fatigued   Assessment & Plan:  Principal Problem:   Symptomatic anemia Active Problems:   Essential hypertension   History of pulmonary  embolism   Iron deficiency anemia   Anemia   Hypercalcemia of malignancy   Multiple myeloma not having achieved remission (HCC)   Folate deficiency   Anemia due to antineoplastic agent   Counseling regarding advance care planning and goals of care   Thrombocytopenia   Pain   Counseling and coordination of care   Goals of care, counseling/discussion   Palliative care encounter   Multiple myeloma Symptomatic anemia Thrombocytopenia Iron deficiency anemia Folate deficiency - Status post 5 units PRBC since arrival - Bone marrow biopsy 12/24.  Pathology report available.  Awaiting oncology input.  Followed by Dr. Onesimo with med-onc - Continue trending CBCs.  Hemoglobin continues to downtrend. - I discussed with oncology.  They recommend continuing to transfuse to keep hemoglobin above 7 - No urgent plans to transfer, may pursue [facility] treatments as an outpatient - Has been on multiple different rounds of chemotherapy.  Will have to pause chemotherapy while attending SNF.  Patient is aware of this.  - Platelets also downtrending but more stable.  Transfuse if counts fall below 10,000.   - Continue with acyclovir , folic acid , thiamine , ferrous sulfate   A-flutter with RVR - New diagnosis - TSH WNL - Cardiology consulted.  Initially on Cardizem  then amiodarone .  Received digoxin  one-time dose.  Rate has been difficult to control - Was on IV amiodarone  and IV diltiazem .  Have transitioned to p.o. now  patient appears to be stable. Cardiology recommends short course of p.o. amiodarone  200 mg twice daily x 30 days, addition to diltiazem  long-acting 240 mg daily - Maintaining NSR now. - Echocardiogram: LVEF 60 to 65%, no RWMA, diastolic parameters indeterminate.  Right ventricle mildly enlarged.  Mildly elevated pulmonary artery systolic pressure.  Large pleural effusion to the left lateral region significant valvular disease noted.  Effusion not noted on CXR after echo - Not a candidate  for anticoagulation due to severe thrombocytopenia and history of prior intracranial hemorrhage  Pleural effusion, resolved - Has received Lasix  with some improvement.  Not currently receiving due to soft blood pressures - Pleural effusion seems large on echocardiogram, follow-up CXR shows very small effusions. - Continue with as needed IV Lasix .  Not needed today.  Dyspnea, resolving - O2 sats remained WNL - CXR with small bilateral pleural effusions and nonspecific opacities likely atelectasis - Remains without cough or fever.  Unlikely to be pneumonia - Did receive Lasix  in the last 2 days with some improvement in her symptoms.  Continue with additional Lasix  as needed - On Breztri .  She reports significant improvement with this.  Does take albuterol  outpatient as needed - Continue with incentive spirometry - No indication for antibiotics at this time  Generalized deconditioning Weakness - Overall patient's functional status appears poor.  Patient has had difficulty pursuing aggressive course of cancer treatment outpatient due to her comorbid conditions and some social issues. - Needs SNF at DC.  We will have to pause her chemotherapy treatments while placed at SNF.  She is aware. - Still consistently weaker than baseline. - Palliative consulted, assisting with pain management  Hypercalcemia of malignancy - On calcitonin, received Zometa  in October - Calcium  levels improving - Heme-onc following  Hypokalemia Hypomagnesemia Hypophosphatemia - Continue trending and replace as needed  Hypertension - Meant to be on ARB, currently held due to hypotension.  Follow BPs closely and resume as able.  Titrate  History of pulmonary embolism History of DVT - Not on anticoagulation due to history of intracranial hemorrhage on Xarelto  - On aspirin  outpatient.  Currently holding secondary to thrombocytopenia.  Body mass index is 37.95 kg/m. Obesity Class 2 - Outpatient follow up for  lifestyle modification and risk factor management  Generalized weakness - Secondary to her multiple comorbidities as above. - PT/OT consulted.  Will need SNF.  TOC consulted for placement   DVT prophylaxis: SCDs only.  Hold anticoagulation given thrombocytopenia.   Code Status: Full Code Disposition: Medically ready for DC.  Will need SNF at DC.  TOC consulted for these arrangements  Consultants:  Treatment Team:  Consulting Physician: Onesimo Emaline Brink, MD Consulting Physician: Clayton Tinnie ORN, DO  Procedures:    Antimicrobials:  Anti-infectives (From admission, onward)    Start     Dose/Rate Route Frequency Ordered Stop   10/26/24 1145  acyclovir  (ZOVIRAX ) tablet 400 mg        400 mg Oral 2 times daily 10/26/24 1048         Data Reviewed: I have personally reviewed following labs and imaging studies CBC: Recent Labs  Lab 10/27/24 0458 10/28/24 0500 10/29/24 0434 10/30/24 0546 10/31/24 0546 11/01/24 0415 11/02/24 0513  WBC 9.8   < > 9.9 9.1 8.5 8.3 8.2  NEUTROABS 3.9  --   --   --   --   --   --   HGB 8.1*   < > 9.0* 8.6* 7.1* 6.8* 8.3*  HCT 25.2*   < >  28.4* 27.6* 22.0* 22.3* 25.8*  MCV 87.2   < > 87.7 89.0 87.6 90.3 87.5  PLT 19*   < > 23* 22* 18* 17* 18*   < > = values in this interval not displayed.   Basic Metabolic Panel: Recent Labs  Lab 10/29/24 0434 10/30/24 0546 10/31/24 0546 11/01/24 0415 11/02/24 0513  NA 142 141 141 139 140  K 3.7 3.5 4.1 4.7 4.3  CL 106 104 106 106 105  CO2 25 22 29 25 24   GLUCOSE 110* 133* 103* 95 102*  BUN 9 12 9 10 11   CREATININE 0.46 0.58 0.56 0.50 0.50  CALCIUM  10.1 10.0 10.0 9.4 8.9  MG 2.3 2.0 1.8 1.7 1.8  PHOS  --  2.4* 2.5 3.6 3.5   GFR: Estimated Creatinine Clearance: 80.3 mL/min (by C-G formula based on SCr of 0.5 mg/dL). Liver Function Tests: Recent Labs  Lab 11/01/24 0415 11/02/24 0513  AST 174* 323*  ALT 7 10  ALKPHOS 156* 235*  BILITOT 0.8 0.7  PROT 6.8 7.2  ALBUMIN 2.7* 2.8*   CBG: No  results for input(s): GLUCAP in the last 168 hours.  No results found for this or any previous visit (from the past 240 hours).   Radiology Studies: No results found.   Scheduled Meds:  acyclovir   400 mg Oral BID   amiodarone   200 mg Oral BID   budesonide -glycopyrrolate -formoterol   2 puff Inhalation BID   calcitonin (salmon)  1 spray Alternating Nares Daily   diltiazem   240 mg Oral Daily   ferrous sulfate   325 mg Oral Q breakfast   folic acid   1 mg Oral BID   loratadine   10 mg Oral QHS   pantoprazole   40 mg Oral Q0600   polyethylene glycol  17 g Oral Daily   potassium chloride   40 mEq Oral BID   senna-docusate  2 tablet Oral BID   thiamine   100 mg Oral Daily   Continuous Infusions:     LOS: 7 days  MDM: Patient is high risk for one or more organ failure.  They necessitate ongoing hospitalization for continued IV therapies and subsequent lab monitoring. Total time spent interpreting labs and vitals, reviewing the medical record, coordinating care amongst consultants and care team members, directly assessing and discussing care with the patient and/or family: 55 min Avryl Roehm, DO [facility]  To contact the attending physician between 7A-7P please use Epic Chat. To contact the covering physician during after hours 7P-7A, please review Amion.  11/02/2024, 3:42 PM   *This document has been created with the assistance of dictation software. Please excuse typographical errors. *   "

## 2024-11-02 NOTE — Progress Notes (Signed)
 OT Cancellation Note  Patient Details Name: EMERI ESTILL MRN: [id number] DOB: December 07, 1965   Cancelled Treatment:    Reason Eval/Treat Not Completed: Patient declined, no reason specified Pt declined OT session citing preference to be seen in morning, knee pain and that medicine for my knees made me feel funny. Educated on various options for OT session though pt said you're not going to change my mind  Mliss Fish 11/02/2024, 12:56 PM

## 2024-11-02 NOTE — Progress Notes (Addendum)
 Carla Hanson   DOB:June 13, 1966   FM#:[id number]    I saw the patient, reviewed her chart, performed independent review of test results, recent bone marrow biopsy, her labs and discussed plan of care with the patient and her mother  ASSESSMENT:  Carla Hanson is a 58 year old female patient admitted on 10/20/2024 with complaints of weakness. Oncologic history is significant for multiple myeloma with bone mets.   ONCOLOGIC HISTORY:  Refractory IgG lambda multiple myeloma -Diagnosed 02/05/2024 after bone marrow biopsy that showed hypercellular bone marrow 60% plasma cell neoplasm. - Status post multiple chemo lines.  She was started on carfilzomib  + cyclophosphamide  + Dex.  Patient missed many treatments due to personal issues.  The plan is to continue outpatient carfilzomib  and hold Cytoxan . - Plan referral to [facility] for consideration of BITE therapy. ASSESSMENT & PLAN:  IgG lambda MM, refractory -Due to concerns for progression of disease, repeat bone marrow biopsy was done on 10/27/2024.  This confirmed hypercellular bone marrow 80% with markedly increased plasma cells. - Medical oncology/Dr. Onesimo is primary oncologist who previously discussed GOC with consideration for hospice which patient declined.   -Recommend continue supportive care with transfusions as needed during this admission.   Due to a compliance issue, she might not be a good candidate for treatment Will defer final recommendation to Dr. Onesimo when he returns In the meantime, continue supportive care  Generalized weakness Fatigue - Patient complained of progressive weakness.  Likely secondary to low blood counts and malignancy. - States she feels tired and sleepy.  Denies acute pain. - Continue supportive care - PT OT following.  Noted plan for discharge to SNF.  Severe thrombocytopenia - Platelets continue to be very low, 18K today. - Recommend platelets for counts <10K or <50 K with active bleeding - Continue to  monitor CBC with differential  Anemia History of iron deficiency anemia - Hemoglobin 8.3 today.  Status post PRBC transfusions this admission - Recommend PRBC transfusion for hemoglobin <8.0 - Continue to monitor CBC with differential  Code Status Full  Subjective:  Patient seen resting comfortably laying in bed.  States that she feels tired and sleepy.  Denies any acute pain or bleeding.  No acute distress noted.  Objective:   Intake/Output Summary (Last 24 hours) at 11/02/2024 1213 Last data filed at 11/02/2024 1000 Gross per 24 hour  Intake 602 ml  Output 900 ml  Net -298 ml     PHYSICAL EXAMINATION: ECOG PERFORMANCE STATUS: 3 - Symptomatic, >50% confined to bed  Vitals:   11/02/24 0500 11/02/24 0800  BP: 132/67 135/67  Pulse:    Resp:    Temp: 98.3 F (36.8 C)   SpO2: 97%    Filed Weights   10/20/24 1548  Weight: 204 lb 2.3 oz (92.6 kg)    GENERAL: alert, no distress and comfortable + obese SKIN: + Pale skin color, + large R UE bruise EYES: + Slight scleral icterus OROPHARYNX: no exudate, no erythema and lips, buccal mucosa, and tongue normal  NECK: supple, thyroid  normal size, non-tender, without nodularity LYMPH: no palpable lymphadenopathy in the cervical, axillary or inguinal LUNGS: clear to auscultation and percussion with normal breathing effort HEART: regular rate & rhythm and no murmurs and no lower extremity edema ABDOMEN: abdomen soft, non-tender and normal bowel sounds MUSCULOSKELETAL: no cyanosis of digits and no clubbing  PSYCH: alert & oriented x 3 with fluent speech NEURO: no focal motor/sensory deficits   All questions were answered. The patient knows  to call the clinic with any problems, questions or concerns.   I personally spent a total of 40 minutes minutes in the care of the patient today including preparing to see the patient, performing a medically appropriate exam/evaluation, referring and communicating with other health care  professionals, documenting clinical information in the EHR, and communicating results.    Olam PARAS Rouson, NP 11/02/2024 12:13 PM    Labs Reviewed:  Lab Results  Component Value Date   WBC 8.2 11/02/2024   HGB 8.3 (L) 11/02/2024   HCT 25.8 (L) 11/02/2024   MCV 87.5 11/02/2024   PLT 18 (LL) 11/02/2024   Recent Labs    10/26/24 0502 10/27/24 0458 10/31/24 0546 11/01/24 0415 11/02/24 0513  NA 142   < > 141 139 140  K 3.2*   < > 4.1 4.7 4.3  CL 109   < > 106 106 105  CO2 22   < > 29 25 24   GLUCOSE 101*   < > 103* 95 102*  BUN 6   < > 9 10 11   CREATININE 0.39*   < > 0.56 0.50 0.50  CALCIUM  11.1*   < > 10.0 9.4 8.9  GFRNONAA >60   < > >60 >60 >60  PROT 7.2  --   --  6.8 7.2  ALBUMIN 2.8*  --   --  2.7* 2.8*  AST 34  --   --  174* 323*  ALT 6  --   --  7 10  ALKPHOS 141*  --   --  156* 235*  BILITOT 0.8  --   --  0.8 0.7   < > = values in this interval not displayed.    Studies Reviewed:   DG CHEST PORT 1 VIEW Result Date: 10/31/2024 CLINICAL DATA:  Pleural effusion.  Multiple myeloma. EXAM: PORTABLE CHEST 1 VIEW COMPARISON:  10/26/2024 FINDINGS: Stable cardiomegaly and pulmonary vascular congestion. Stable mild pleural thickening or tiny pleural effusions. No significant change in left lower lobe atelectasis or scarring. No new or worsening areas of pulmonary opacity are seen. IMPRESSION: Stable cardiomegaly and pulmonary vascular congestion. Stable left lower lobe atelectasis or scarring. Stable tiny bilateral pleural effusions versus pleural thickening. Electronically Signed   By: Norleen DELENA Kil M.D.   On: 10/31/2024 09:54   ECHOCARDIOGRAM COMPLETE Result Date: 10/30/2024    ECHOCARDIOGRAM REPORT   Patient Name:   Carla Hanson [facility] Date of Exam: 10/30/2024 Medical Rec #:  [id number]      Height:       61.5 in Accession #:    [id number]     Weight:       204.1 lb Date of Birth:  1965-12-23     BSA:          1.917 m Patient Age:    58 years       BP:           126/70 mmHg Patient  Gender: F              HR:           86 bpm. Exam Location:  Inpatient Procedure: 2D Echo, Cardiac Doppler, Color Doppler and Intracardiac            Opacification Agent (Both Spectral and Color Flow Doppler were            utilized during procedure). Indications:    Atrial Flutter  History:        Patient has prior history of  Echocardiogram examinations, most                 recent 12/05/2021.  Sonographer:    Philomena Daring Referring Phys: [id number] JOETTE PEBBLES IMPRESSIONS  1. Left ventricular ejection fraction, by estimation, is 60 to 65%. The left ventricle has normal function. The left ventricle has no regional wall motion abnormalities. Left ventricular diastolic parameters are indeterminate.  2. Right ventricular systolic function is normal. The right ventricular size is mildly enlarged. There is mildly elevated pulmonary artery systolic pressure. The estimated right ventricular systolic pressure is 39.9 mmHg.  3. Large pleural effusion in the left lateral region.  4. The mitral valve was not well visualized. No evidence of mitral valve regurgitation.  5. The aortic valve is tricuspid. Aortic valve regurgitation is not visualized. No aortic stenosis is present.  6. The inferior vena cava is dilated in size with <50% respiratory variability, suggesting right atrial pressure of 15 mmHg. Comparison(s): Prior images reviewed side by side. 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 FINDINGS  Left Ventricle: Left ventricular ejection fraction, by estimation, is 60 to 65%. The left ventricle has normal function. The left ventricle has no regional wall motion abnormalities. The left ventricular internal cavity size was normal in size. There is  no left ventricular hypertrophy. Left ventricular diastolic parameters are indeterminate. Right Ventricle: The right ventricular size is mildly enlarged. No increase in right ventricular wall thickness. Right ventricular systolic function is normal. There is mildly  elevated pulmonary artery systolic pressure. The tricuspid regurgitant velocity is 2.23 m/s, and with an assumed right atrial pressure of 20 mmHg, the estimated right ventricular systolic pressure is 39.9 mmHg. Left Atrium: Left atrial size was normal in size. Right Atrium: Right atrial size was normal in size. Pericardium: There is no evidence of pericardial effusion. Mitral Valve: The mitral valve was not well visualized. No evidence of mitral valve regurgitation. Tricuspid Valve: The tricuspid valve is normal in structure. Tricuspid valve regurgitation is trivial. No evidence of tricuspid stenosis. Aortic Valve: The aortic valve is tricuspid. Aortic valve regurgitation is not visualized. No aortic stenosis is present. Aortic valve mean gradient measures 9.0 mmHg. Aortic valve peak gradient measures 18.6 mmHg. Aortic valve area, by VTI measures 2.62  cm. Pulmonic Valve: The pulmonic valve was normal in structure. Pulmonic valve regurgitation is trivial. No evidence of pulmonic stenosis. Aorta: The aortic root and ascending aorta are structurally normal, with no evidence of dilitation. Venous: The inferior vena cava is dilated in size with less than 50% respiratory variability, suggesting right atrial pressure of 15 mmHg. IAS/Shunts: No atrial level shunt detected by color flow Doppler. Additional Comments: There is a large pleural effusion in the left lateral region.  LEFT VENTRICLE PLAX 2D LVIDd:         5.00 cm   Diastology LVIDs:         3.40 cm   LV e' medial:    7.72 cm/s LV PW:         1.00 cm   LV E/e' medial:  12.9 LV IVS:        0.90 cm   LV e' lateral:   11.00 cm/s LVOT diam:     2.00 cm   LV E/e' lateral: 9.1 LV SV:         87 LV SV Index:   45 LVOT Area:     3.14 cm  RIGHT VENTRICLE  IVC RV S prime:     20.40 cm/s  IVC diam: 2.60 cm TAPSE (M-mode): 3.2 cm LEFT ATRIUM             Index        RIGHT ATRIUM           Index LA Vol (A2C):   53.7 ml 28.01 ml/m  RA Area:     17.00 cm LA Vol  (A4C):   34.2 ml 17.84 ml/m  RA Volume:   39.90 ml  20.82 ml/m LA Biplane Vol: 43.6 ml 22.75 ml/m  AORTIC VALVE AV Area (Vmax):    2.51 cm AV Area (Vmean):   2.50 cm AV Area (VTI):     2.62 cm AV Vmax:           215.50 cm/s AV Vmean:          137.500 cm/s AV VTI:            0.333 m AV Peak Grad:      18.6 mmHg AV Mean Grad:      9.0 mmHg LVOT Vmax:         172.00 cm/s LVOT Vmean:        109.500 cm/s LVOT VTI:          0.278 m LVOT/AV VTI ratio: 0.83  AORTA Ao Root diam: 3.20 cm Ao Asc diam:  3.00 cm MITRAL VALVE                TRICUSPID VALVE MV Area (PHT): 4.71 cm     TR Peak grad:   19.9 mmHg MV Decel Time: 161 msec     TR Vmax:        223.00 cm/s MV E velocity: 99.80 cm/s MV A velocity: 101.00 cm/s  SHUNTS MV E/A ratio:  0.99         Systemic VTI:  0.28 m                             Systemic Diam: 2.00 cm Stanly Leavens MD Electronically signed by Stanly Leavens MD Signature Date/Time: 10/30/2024/3:14:55 PM    Final    IR BONE MARROW BIOPSY & ASPIRATION Result Date: 10/27/2024 INDICATION: Myeloma, reassess marrow status EXAM: FLUOROSCOPICALLY GUIDED RIGHT ILIAC BONE MARROW ASPIRATION AND CORE BIOPSY Date:  10/27/2024 10/27/2024 10:19 am Radiologist:  CHRISTELLA. Frederic Specking, MD Guidance: Fluoroscopy FLUOROSCOPY: Fluoroscopy Time:  (56 mGy). MEDICATIONS: 1% lidocaine  local ANESTHESIA/SEDATION: 1.5 mg IV Versed ; 100 mcg IV Fentanyl  Moderate Sedation Time:  11 minutes The patient was continuously monitored during the procedure by the interventional radiology nurse under my direct supervision. CONTRAST:  None. COMPLICATIONS: None immediate PROCEDURE: Informed consent was obtained from the patient following explanation of the procedure, risks, benefits and alternatives. The patient understands, agrees and consents for the procedure. All questions were addressed. A time out was performed. The patient was positioned prone and fluoroscopic localization was performed of the pelvis to demonstrate the iliac  marrow spaces. Maximal barrier sterile technique utilized including caps, mask, sterile gowns, sterile gloves, large sterile drape, hand hygiene, and Betadine prep. Under sterile conditions and local anesthesia, an 11 gauge coaxial bone biopsy needle was advanced into the right iliac marrow space. Needle position was confirmed with fluoroscopic imaging. Initially, bone marrow aspiration was performed. Next, the 11 gauge outer cannula was utilized to obtain a right iliac bone marrow core biopsy. Needle was removed. Hemostasis was obtained with compression. The  patient tolerated the procedure well. Samples were prepared with the cytotechnologist. No immediate complications. IMPRESSION: Fluoroscopically guided right iliac bone marrow aspiration and core biopsy. Electronically Signed   By: CHRISTELLA.  Shick M.D.   On: 10/27/2024 10:31   DG CHEST PORT 1 VIEW Result Date: 10/26/2024 CLINICAL DATA:  Dyspnea. EXAM: PORTABLE CHEST 1 VIEW COMPARISON:  09/08/2024 FINDINGS: The heart is enlarged. Mediastinal contours are stable. Vascular congestion. There are small bilateral pleural effusions. Ill-defined bilateral infrahilar opacities. No pneumothorax. IMPRESSION: 1. Cardiomegaly with vascular congestion and small bilateral pleural effusions. 2. Ill-defined bilateral infrahilar opacities, atelectasis versus pneumonia. Electronically Signed   By: Andrea Gasman M.D.   On: 10/26/2024 16:52

## 2024-11-02 NOTE — TOC Progression Note (Signed)
 Transition of Care [facility]) - Progression Note    Patient Details  Name: Carla Hanson MRN: [id number] Date of Birth: [birth year]  Transition [facility] Care [facility]) CM/SW Contact  Evyn Putzier, Nathanel, RN Phone Number: 11/02/2024, 11:25 AM  Clinical Narrative:  Spoke to patient about facilities so far with no bed offers-confirmed that patient does not want to receive any Cancer treatments while [facility] for rehab-confirmed w/team. LVM w/2 considering facilitieswith update on no cancer treatment while @ facilities-await outcome-[facility] rep [facility], & [facility] rep [place].     Expected Discharge Plan: Skilled Nursing Facility Barriers to Discharge: Continued Medical Work up               Expected Discharge Plan and Services In-house Referral: Clinical Social Work   Post Acute Care Choice: Skilled Nursing Facility Living arrangements for the past 2 months: Apartment                 DME Arranged: N/A DME Agency: NA                   Social Drivers of Health (SDOH) Interventions SDOH Screenings   Food Insecurity: No Food Insecurity (10/20/2024)  Housing: Low Risk (10/20/2024)  Transportation Needs: No Transportation Needs (10/20/2024)  Utilities: Not At Risk (10/20/2024)  Depression (PHQ2-9): Low Risk (09/08/2024)  Financial Resource Strain: Low Risk (05/14/2024)  Physical Activity: Insufficiently Active (05/14/2024)  Social Connections: Moderately Integrated (10/20/2024)  Stress: No Stress Concern Present (05/14/2024)  Tobacco Use: Medium Risk (10/20/2024)    Readmission Risk Interventions    10/26/2024    2:05 PM 02/03/2024    1:14 PM  Readmission Risk Prevention Plan  Transportation Screening Complete Complete  PCP or Specialist Appt within 3-5 Days Complete Complete  HRI or Home Care Consult Complete   Social Work Consult for Recovery Care Planning/Counseling Complete Complete  Palliative Care Screening  Not Applicable  Medication Review Oceanographer) Complete  Complete

## 2024-11-03 DIAGNOSIS — R4589 Other symptoms and signs involving emotional state: Secondary | ICD-10-CM | POA: Diagnosis not present

## 2024-11-03 DIAGNOSIS — D696 Thrombocytopenia, unspecified: Secondary | ICD-10-CM | POA: Diagnosis not present

## 2024-11-03 DIAGNOSIS — D649 Anemia, unspecified: Secondary | ICD-10-CM | POA: Diagnosis not present

## 2024-11-03 DIAGNOSIS — C9000 Multiple myeloma not having achieved remission: Secondary | ICD-10-CM | POA: Diagnosis not present

## 2024-11-03 DIAGNOSIS — Z515 Encounter for palliative care: Secondary | ICD-10-CM | POA: Diagnosis not present

## 2024-11-03 DIAGNOSIS — Z7189 Other specified counseling: Secondary | ICD-10-CM | POA: Diagnosis not present

## 2024-11-03 LAB — COMPREHENSIVE METABOLIC PANEL WITH GFR
ALT: 8 U/L (ref 0–44)
AST: 183 U/L — ABNORMAL HIGH (ref 15–41)
Albumin: 2.5 g/dL — ABNORMAL LOW (ref 3.5–5.0)
Alkaline Phosphatase: 246 U/L — ABNORMAL HIGH (ref 38–126)
Anion gap: 10 (ref 5–15)
BUN: 12 mg/dL (ref 6–20)
CO2: 25 mmol/L (ref 22–32)
Calcium: 9.6 mg/dL (ref 8.9–10.3)
Chloride: 103 mmol/L (ref 98–111)
Creatinine, Ser: 0.44 mg/dL (ref 0.44–1.00)
GFR, Estimated: >60 mL/min
Glucose, Bld: 92 mg/dL (ref 70–99)
Potassium: 3.8 mmol/L (ref 3.5–5.1)
Sodium: 138 mmol/L (ref 135–145)
Total Bilirubin: 0.8 mg/dL (ref 0.0–1.2)
Total Protein: 7.2 g/dL (ref 6.5–8.1)

## 2024-11-03 LAB — CBC
HCT: 26.2 % — ABNORMAL LOW (ref 36.0–46.0)
Hemoglobin: 8.4 g/dL — ABNORMAL LOW (ref 12.0–15.0)
MCH: 28.2 pg (ref 26.0–34.0)
MCHC: 32.1 g/dL (ref 30.0–36.0)
MCV: 87.9 fL (ref 80.0–100.0)
Platelets: 16 K/uL — CL (ref 150–400)
RBC: 2.98 MIL/uL — ABNORMAL LOW (ref 3.87–5.11)
RDW: 16.5 % — ABNORMAL HIGH (ref 11.5–15.5)
WBC: 6.8 K/uL (ref 4.0–10.5)
nRBC: 12.4 % — ABNORMAL HIGH (ref 0.0–0.2)

## 2024-11-03 LAB — PHOSPHORUS: Phosphorus: 2.5 mg/dL (ref 2.5–4.6)

## 2024-11-03 LAB — MAGNESIUM: Magnesium: 1.8 mg/dL (ref 1.7–2.4)

## 2024-11-03 NOTE — Progress Notes (Signed)
 Physical Therapy Treatment Patient Details Name: Carla Hanson MRN: [id number] DOB: Jun 07, 1966 Today's Date: 11/03/2024   History of Present Illness 58 y.o. female was found in the hematologist office with a hemoglobin of 5.3. Pt with past medical history of DVT on aspirin  only due to intracranial hemorrhage while on Xarelto , essential hypertension, multiple myeloma on chemotherapy comes in with generalized weakness    PT Comments  Pt agreeable to therapy session. Mod A for bed mobility. Once EOB, pt able to perform seated UE and LE exercises with instruction. She declined attempting standing on today due to legs feeling too weak. She required +2 assist to reposition in bed. Plan is for SNF. Will continue to follow.     If plan is discharge home, recommend the following:     Can travel by private vehicle     No  Equipment Recommendations  None recommended by PT    Recommendations for Other Services       Precautions / Restrictions Precautions Precautions: Fall Restrictions Weight Bearing Restrictions Per Provider Order: No     Mobility  Bed Mobility Overal bed mobility: Needs Assistance Bed Mobility: Supine to Sit, Sit to Supine     Supine to sit: Mod assist, HOB elevated, Used rails Sit to supine: Mod assist, HOB elevated, Used rails   General bed mobility comments: Assist for bil LEs. Increased time. Cues and encouragement provided.    Transfers                   General transfer comment: NT-pt declined reporting legs felt too weak    Ambulation/Gait                   Stairs             Wheelchair Mobility     Tilt Bed    Modified Rankin (Stroke Patients Only)       Balance Overall balance assessment: Needs assistance Sitting-balance support: Bilateral upper extremity supported, Feet supported Sitting balance-Leahy Scale: Fair                                      Hotel Manager:  No apparent difficulties  Cognition Arousal: Alert Behavior During Therapy: WFL for tasks assessed/performed   PT - Cognitive impairments: No apparent impairments                         Following commands: Intact      Cueing Cueing Techniques: Verbal cues  Exercises General Exercises - Upper Extremity Shoulder Flexion: AROM, Both, 5 reps, Seated General Exercises - Lower Extremity Ankle Circles/Pumps: AROM, Both, 10 reps, Seated Quad Sets: AROM, Both, 10 reps, Seated Hip Flexion/Marching: AROM, Both, 10 reps, Seated Other Exercises Other Exercises: air punches x 10 reps, scapular retraction x 10 reps, shoulder shrugs x 10 reps    General Comments        Pertinent Vitals/Pain Pain Assessment Pain Assessment: Faces Faces Pain Scale: Hurts a little bit Pain Location: bil knees Pain Descriptors / Indicators: Aching Pain Intervention(s): Limited activity within patient's tolerance, Monitored during session, Repositioned    Home Living                          Prior Function            PT Goals (current  goals can now be found in the care plan section) Progress towards PT goals: Progressing toward goals    Frequency    Min 3X/week      PT Plan      Co-evaluation              AM-PAC PT 6 Clicks Mobility   Outcome Measure  Help needed turning from your back to your side while in a flat bed without using bedrails?: A Lot Help needed moving from lying on your back to sitting on the side of a flat bed without using bedrails?: A Lot Help needed moving to and from a bed to a chair (including a wheelchair)?: Total Help needed standing up from a chair using your arms (e.g., wheelchair or bedside chair)?: Total Help needed to walk in hospital room?: Total Help needed climbing 3-5 steps with a railing? : Total 6 Click Score: 8    End of Session   Activity Tolerance: Patient tolerated treatment well Patient left: in bed;with call  bell/phone within reach;with bed alarm set   PT Visit Diagnosis: Difficulty in walking, not elsewhere classified (R26.2);Other abnormalities of gait and mobility (R26.89);Muscle weakness (generalized) (M62.81)     Time: 8545-8461 PT Time Calculation (min) (ACUTE ONLY): 44 min  Charges:    $Therapeutic Exercise: 23-37 mins $Therapeutic Activity: 8-22 mins PT General Charges $$ ACUTE PT VISIT: 1 Visit                        Dannial SQUIBB, PT Acute Rehabilitation  Office: [phone]

## 2024-11-03 NOTE — Plan of Care (Signed)
" °  Problem: Education: Goal: Knowledge of General Education information will improve Description: Including pain rating scale, medication(s)/side effects and non-pharmacologic comfort measures Outcome: Progressing   Problem: Health Behavior/Discharge Planning: Goal: Ability to manage health-related needs will improve Outcome: Progressing   Problem: Clinical Measurements: Goal: Ability to maintain clinical measurements within normal limits will improve Outcome: Progressing Goal: Will remain free from infection Outcome: Progressing Goal: Diagnostic test results will improve Outcome: Progressing Goal: Respiratory complications will improve Outcome: Progressing Goal: Cardiovascular complication will be avoided Outcome: Progressing   Problem: Activity: Goal: Risk for activity intolerance will decrease Outcome: Progressing   Problem: Nutrition: Goal: Adequate nutrition will be maintained Outcome: Progressing   Problem: Coping: Goal: Level of anxiety will decrease Outcome: Progressing   Problem: Elimination: Goal: Will not experience complications related to urinary retention Outcome: Progressing   Problem: Pain Managment: Goal: General experience of comfort will improve and/or be controlled Outcome: Progressing   Problem: Safety: Goal: Ability to remain free from injury will improve Outcome: Progressing   Problem: Skin Integrity: Goal: Risk for impaired skin integrity will decrease Outcome: Progressing   Problem: Education: Goal: Knowledge of General Education information will improve Description: Including pain rating scale, medication(s)/side effects and non-pharmacologic comfort measures Outcome: Progressing   Problem: Health Behavior/Discharge Planning: Goal: Ability to manage health-related needs will improve Outcome: Progressing   Problem: Clinical Measurements: Goal: Ability to maintain clinical measurements within normal limits will improve Outcome:  Progressing Goal: Will remain free from infection Outcome: Progressing Goal: Diagnostic test results will improve Outcome: Progressing Goal: Respiratory complications will improve Outcome: Progressing Goal: Cardiovascular complication will be avoided Outcome: Progressing   Problem: Activity: Goal: Risk for activity intolerance will decrease Outcome: Progressing   Problem: Nutrition: Goal: Adequate nutrition will be maintained Outcome: Progressing   Problem: Coping: Goal: Level of anxiety will decrease Outcome: Progressing   Problem: Elimination: Goal: Will not experience complications related to bowel motility Outcome: Progressing Goal: Will not experience complications related to urinary retention Outcome: Progressing   Problem: Pain Managment: Goal: General experience of comfort will improve and/or be controlled Outcome: Progressing   Problem: Safety: Goal: Ability to remain free from injury will improve Outcome: Progressing   Problem: Skin Integrity: Goal: Risk for impaired skin integrity will decrease Outcome: Progressing   "

## 2024-11-03 NOTE — Progress Notes (Signed)
" °  Progress Note   Patient: Carla Hanson FMW:[id number] DOB: 1966-02-28 DOA: 10/20/2024     8 DOS: the patient was seen and examined on 11/03/2024   Brief hospital course:   Assessment and Plan: Multiple myeloma with hypercalcemia Symptomatic anemia Thrombocytopenia Iron deficiency anemia Folate deficiency Status post 5 units PRBC this admission Transfuse for hemoglobin less than 7 Holding chemotherapy for now while attending SNF Transfuse if platelet count falls below 10,000 or for bleeding Hypercalcemia was treated with Zometa  back in October.  Continue calcitonin.  Atrial flutter with RVR New diagnosis.  TSH within normal limits.  Seen by cardiology.  Rate control was difficult.  Treated with amiodarone  and diltiazem  IV. 2D echocardiogram normal LVEF, no wall motion abnormalities. Plan short course of amiodarone  oral for 30 days, long-acting diltiazem . Not a candidate for anticoagulation secondary to thrombocytopenia and history of intracranial hemorrhage  Resolved pleural effusion  Generalized deconditioning Generalized weakness Plan SNF  PMH PE, DVT Aspirin  given no anticoagulation given history of intracranial hemorrhage on Xarelto   Class 2 obesity Body mass index is 37.95 kg/m.     Subjective:  Feels ok  Physical Exam: Vitals:   11/02/24 0800 11/02/24 2007 11/03/24 0427 11/03/24 1100  BP: 135/67 (!) 132/91 128/71   Pulse:  96 89 96  Resp:  19 18 17   Temp:  98.8 F (37.1 C) 98.3 F (36.8 C) 98.9 F (37.2 C)  TempSrc:  Oral Oral Oral  SpO2:  99% 96% 94%  Weight:      Height:       Physical Exam Vitals reviewed.  Constitutional:      General: She is not in acute distress.    Appearance: She is not ill-appearing or toxic-appearing.  Cardiovascular:     Rate and Rhythm: Normal rate and regular rhythm.     Heart sounds: No murmur heard. Pulmonary:     Effort: Pulmonary effort is normal. No respiratory distress.     Breath sounds: No wheezing,  rhonchi or rales.  Neurological:     Mental Status: She is alert.  Psychiatric:        Mood and Affect: Mood normal.        Behavior: Behavior normal.     Data Reviewed: BMP unremarkable, LFTs notable for elevated alkaline phosphatase of unclear significance AST decreased Platelets stable at 16 He globin stable 8.4  Family Communication:   Disposition: Status is: Inpatient Remains inpatient appropriate because: Plan SNF     Time spent: 35 minutes  Author: Toribio Door, MD 11/03/2024 7:12 PM  For on call review [email or web address].    "

## 2024-11-03 NOTE — Progress Notes (Signed)
 " Daily Progress Note   Patient Name: Carla Hanson       Date: 11/03/2024 DOB: 1966/04/08  Age: 58 y.o. MRN#: [id number] Attending Physician: Jadine Toribio SQUIBB, MD Primary Care Physician: Anders Otto DASEN, MD Admit Date: 10/20/2024 Length of Stay: 8 days  Reason for Consultation/Follow-up: Establishing goals of care  Subjective:   EMR including recent documentation from [facility], hospitalist, and oncologist.  Patient seen by Dr. Lonn yesterday who is currently covering for Dr. Onesimo.  The repeat bone marrow biopsy on 10/27/2024 confirmed hypercellular bone marrow at 80% with markedly increased plasma cells.  Dr. Onesimo had already discussed the idea of hospice with patient which she had declined.  At this time continuing supportive medical care.  Reviewed recent CBC noting hemoglobin stable at 8.4.  Platelets remain low at 16.  Review of CMP notes albumin remains low at 2.5.  At time of EMR review of past 24 hours patient has received Norco 5 x 2 doses and as needed IV morphine  2 mg x 1 dose.  Presented to bedside to see patient.  Patient laying comfortably in bed, no visitors present at bedside.  Able to review patient's symptoms at this time.  Patient notes that the Norco works well when she receives it.  Revisited patient's goals for medical care moving forward after visit with oncologist yesterday.  Patient notes that she does want to resume cancer directed therapies if offered.  We again discussed importance of functional status related to this.  Spent time explaining that based on data and PPS scores, patient has to be able to maintain a certain functional level to be appropriate for cancer directed therapies.  If patient does not have a functional level that is appropriate, worried that cancer directed therapies would harm her and not potentially help her.  Patient then verbalized recognizing importance of PT and OT.  Discussed patient must regain strength and that would be through going to  rehab to even be considered for Further cancer directed therapies if they are appropriate.  Patient acknowledged this.  All questions answered at that time.  Noted palliative medicine team will continue to follow with patient's medical journey.  Objective:   Vital Signs:  BP 128/71 (BP Location: Right Wrist)   Pulse 89   Temp 98.3 F (36.8 C) (Oral)   Resp 18   Ht 5' 1.5 (1.562 m)   Wt 92.6 kg   LMP  (LMP Unknown)   SpO2 96%   BMI 37.95 kg/m   Physical Exam: General: NAD, alert, chronically ill-appearing, pleasant Cardiovascular: RRR Respiratory: no increased work of breathing noted, not in respiratory distress Abdomen: not distended Skin: Large ecchymoses present on the right upper extremity Neuro: A&Ox4, following commands easily Psych: appropriately answers all questions  Assessment & Plan:   Assessment: Patient is a 58 year old female with a past medical history of DVT only on aspirin  due to prior intracranial hemorrhage, hypertension, and multiple myeloma who was admitted on 10/20/2024 for generalized weakness and found to have symptomatic anemia. During hospitalization, has received management for symptomatic anemia and thrombocytopenia with transfusions. Hospitalization also complicated by a flutter with RVR for which cardiology was consulted for recommendations. Palliative medicine team consulted to assist with complex medical decision making.   Recommendations/Plan: # Complex medical decision making/goals of care:      - Discussed care with patient as detailed above in HPI.  Patient continues to desire appropriate medical therapies; would want to receive cancer directed therapies if they are  offered.  Again emphasized importance of good functional status which would be required to be appropriate for cancer directed therapies.  Emphasized importance of working with PT/OT and going to rehab to regain strength.  Patient acknowledged this.  Palliative medicine team continuing  to follow with patient's medical journey.                -  Code Status: Full Code                                - Have discussed CODE STATUS with patient as detailed above in HPI.  Patient noted that providers have already discussed CODE STATUS with her and she would like to remain full code at this time.   # Symptom management Patient is receiving these palliative interventions for symptom management with an intent to improve quality of life.                 - Pain, in setting of multiple myeloma                               - Continue hydrocodone -acetaminophen  5-325 mg 1 tablet every 6 hours as needed                               - Continue as needed IV morphine  2 mg every 4 hours as needed   # Psycho-social/Spiritual Support:  - Support System: Mother, 4 children (3 daughters and 1 son)   # Discharge Planning:  To Be Determined - Already placed referral for ambulatory palliative medicine follow-up at [facility]  Thank you for allowing the palliative care team to participate in the care Carla Hanson.  Tinnie Radar, DO Palliative Care Provider PMT # [phone]  If patient remains symptomatic despite maximum doses, please call PMT at [phone] between 0700 and 1900. Outside of these hours, please call attending, as PMT does not have night coverage.  Personally spent 35 minutes in patient care including extensive chart review (labs, imaging, progress/consult notes, vital signs), medically appropraite exam, discussed with treatment team, education to patient, family, and staff, documenting clinical information, medication review and management, coordination of care, and available advanced directive documents.   "

## 2024-11-03 NOTE — TOC Progression Note (Addendum)
 Transition of Care [facility]) - Progression Note    Patient Details  Name: Carla Hanson MRN: [id number] Date of Birth: [birth year]  [facility] CM/SW Contact  Judeth Gilles, Nathanel, RN Phone Number: 11/03/2024, 12:36 PM  Clinical Narrative: LVM w/both facilities considering [facility] rep [place], & [facility]  rep [place]-await if can accept with no cancer treatments while @ facility & to initiate auth-await outcome. -3:16p TC [facility] await call back from christy if can accept;[facility] rep [place] will f/u & have office contact me tomorrow or I can call main tel# in am for f/u.   -3:30p [facility] rep [place] accepted, she will initiate auth.  -4:32p Received call from [facility] rep christy has accepted-will let patient know & await choice.  [facility]  Considering -- [address], [place] [address] [phone]   Service Provider Request Status Stars Address Phone  [facility]  Accepted [address], [place] [address] [phone]    Expected Discharge Plan: Skilled Nursing Facility Barriers to Discharge: Continued Medical Work up               Expected Discharge Plan and Services In-house Referral: Clinical Social Work   Post Acute Care Choice: Skilled Nursing Facility Living arrangements for the past 2 months: Apartment                 DME Arranged: N/A DME Agency: NA                   Social Drivers of Health (SDOH) Interventions SDOH Screenings   Food Insecurity: No Food Insecurity (10/20/2024)  Housing: Low Risk (10/20/2024)  Transportation Needs: No Transportation Needs (10/20/2024)  Utilities: Not At Risk (10/20/2024)  Depression (PHQ2-9): Low Risk (09/08/2024)  Financial Resource Strain: Low Risk (05/14/2024)  Physical Activity: Insufficiently Active (05/14/2024)  Social Connections: Moderately Integrated (10/20/2024)  Stress: No Stress Concern Present (05/14/2024)  Tobacco Use:  Medium Risk (10/20/2024)    Readmission Risk Interventions    10/26/2024    2:05 PM 02/03/2024    1:14 PM  Readmission Risk Prevention Plan  Transportation Screening Complete Complete  PCP or Specialist Appt within 3-5 Days Complete Complete  HRI or Home Care Consult Complete   Social Work Consult for Recovery Care Planning/Counseling Complete Complete  Palliative Care Screening  Not Applicable  Medication Review Oceanographer) Complete Complete

## 2024-11-03 NOTE — Plan of Care (Signed)
" °  Problem: Safety: Goal: Ability to remain free from injury will improve Outcome: Progressing   Problem: Education: Goal: Knowledge of General Education information will improve Description: Including pain rating scale, medication(s)/side effects and non-pharmacologic comfort measures Outcome: Progressing   Problem: Coping: Goal: Level of anxiety will decrease Outcome: Progressing   Problem: Safety: Goal: Ability to remain free from injury will improve Outcome: Progressing   "

## 2024-11-04 DIAGNOSIS — D509 Iron deficiency anemia, unspecified: Secondary | ICD-10-CM | POA: Diagnosis not present

## 2024-11-04 DIAGNOSIS — D696 Thrombocytopenia, unspecified: Secondary | ICD-10-CM | POA: Diagnosis not present

## 2024-11-04 DIAGNOSIS — C9000 Multiple myeloma not having achieved remission: Secondary | ICD-10-CM | POA: Diagnosis not present

## 2024-11-04 DIAGNOSIS — D649 Anemia, unspecified: Secondary | ICD-10-CM | POA: Diagnosis not present

## 2024-11-04 MED ORDER — ORAL CARE MOUTH RINSE: 15.0000 mL | OROMUCOSAL | Status: DC | PRN

## 2024-11-04 NOTE — Hospital Course (Signed)
 59 year old woman PMH including multiple myeloma, intracranial hemorrhage on Xarelto , presented with symptomatic anemia.  Underwent multiple transfusions, seen by oncology, underwent repeat bone marrow biopsy.  Eventually hemoglobin and platelets stabilized.  Oncology plans referral to [facility] for consideration of BITE therapy.  Hospitalization was complicated by new diagnosis atrial flutter with rapid ventricular response which was difficult to control.  Seen by cardiology.  Consultants Oncology IR Cardiology  Palliative Care  Procedures/Events 12/17 admission for anemia 12/24 fluoro RT ILIAC BM ASP AND CORE  12/26 new diagnosis aflutter RVR

## 2024-11-04 NOTE — Plan of Care (Signed)
" °  Problem: Education: Goal: Knowledge of General Education information will improve Description: Including pain rating scale, medication(s)/side effects and non-pharmacologic comfort measures Outcome: Progressing   Problem: Safety: Goal: Ability to remain free from injury will improve Outcome: Progressing   Problem: Skin Integrity: Goal: Risk for impaired skin integrity will decrease Outcome: Progressing   Problem: Safety: Goal: Ability to remain free from injury will improve Outcome: Progressing   Problem: Skin Integrity: Goal: Risk for impaired skin integrity will decrease Outcome: Progressing   "

## 2024-11-04 NOTE — Progress Notes (Addendum)
" °  Progress Note   Patient: Carla Hanson FMW:[id number] DOB: 12/23/1965 DOA: 10/20/2024     9 DOS: the patient was seen and examined on 11/04/2024   Brief hospital course: 59 year old woman PMH including multiple myeloma, intracranial hemorrhage on Xarelto , presented with symptomatic anemia.  Underwent multiple transfusions, seen by oncology, underwent repeat bone marrow biopsy.  Eventually hemoglobin and platelets stabilized.  Oncology plans referral to [facility] for consideration of BITE therapy.  Hospitalization was complicated by new diagnosis atrial flutter with rapid ventricular response which was difficult to control.  Seen by cardiology.  Consultants Oncology IR Cardiology  Palliative Care  Procedures/Events 12/17 admission for anemia 12/24 fluoro RT ILIAC BM ASP AND CORE  12/26 new diagnosis aflutter RVR  Assessment and Plan: Multiple myeloma not having achieved remission Symptomatic anemia Thrombocytopenia Iron deficiency anemia Folate deficiency Status post 5 units PRBC this admission Transfuse for hemoglobin less than 7 Holding chemotherapy for now while attending SNF.  Plan for outpatient referral to [facility] for consideration of BITE therapy, PET/CT as outpatient. Transfuse if platelet count falls below 10,000 or above 50,000 if bleeding  Hypercalcemia of malignancy Hypercalcemia was treated with Zometa  12/18, receives every 28 days.  Continue calcitonin. Calcium  is normalized   Atrial flutter with RVR New diagnosis.  TSH within normal limits.  Seen by cardiology.  Rate control was difficult.  Treated with amiodarone  and diltiazem  IV. 2D echocardiogram normal LVEF, no wall motion abnormalities. Plan short course of amiodarone  oral for 30 days, long-acting diltiazem . Not a candidate for anticoagulation secondary to thrombocytopenia and history of intracranial hemorrhage   Tiny pleural effusions   Generalized deconditioning Generalized weakness Plan SNF    PMH PE, DVT Aspirin . No anticoagulation given history of intracranial hemorrhage on Xarelto    Class 2 obesity Body mass index is 37.95 kg/m.     Subjective:  Feels ok today. Hopes to go to [facility].  Physical Exam: Vitals:   11/03/24 1100 11/03/24 2052 11/04/24 0621 11/04/24 1347  BP:  137/70 (!) 146/78 (!) 143/73  Pulse: 96 88  89  Resp: 17 19 20 15   Temp: 98.9 F (37.2 C) 98.2 F (36.8 C) 97.8 F (36.6 C) 98.3 F (36.8 C)  TempSrc: Oral Oral Oral Oral  SpO2: 94% 97% 98% 98%  Weight:      Height:       Physical Exam Vitals reviewed.  Constitutional:      General: She is not in acute distress.    Appearance: She is not ill-appearing or toxic-appearing.  Cardiovascular:     Rate and Rhythm: Normal rate and regular rhythm.     Heart sounds: No murmur heard. Pulmonary:     Effort: Pulmonary effort is normal. No respiratory distress.     Breath sounds: No wheezing, rhonchi or rales.  Neurological:     Mental Status: She is alert.  Psychiatric:        Mood and Affect: Mood normal.        Behavior: Behavior normal.     Data Reviewed: No new data  Family Communication: none present  Disposition: Status is: Inpatient Remains inpatient appropriate because: await SNF     Time spent: 20 minutes  Author: Toribio Door, MD 11/04/2024 6:12 PM  For on call review [email or web address].    "

## 2024-11-05 ENCOUNTER — Other Ambulatory Visit: Payer: Self-pay

## 2024-11-05 ENCOUNTER — Other Ambulatory Visit (HOSPITAL_COMMUNITY): Payer: Self-pay

## 2024-11-05 DIAGNOSIS — C9000 Multiple myeloma not having achieved remission: Secondary | ICD-10-CM | POA: Diagnosis not present

## 2024-11-05 DIAGNOSIS — D649 Anemia, unspecified: Secondary | ICD-10-CM | POA: Diagnosis not present

## 2024-11-05 DIAGNOSIS — D696 Thrombocytopenia, unspecified: Secondary | ICD-10-CM | POA: Diagnosis not present

## 2024-11-05 LAB — BASIC METABOLIC PANEL WITH GFR
Anion gap: 10 (ref 5–15)
BUN: 11 mg/dL (ref 6–20)
CO2: 22 mmol/L (ref 22–32)
Calcium: 10.1 mg/dL (ref 8.9–10.3)
Chloride: 104 mmol/L (ref 98–111)
Creatinine, Ser: 0.46 mg/dL (ref 0.44–1.00)
GFR, Estimated: >60 mL/min
Glucose, Bld: 125 mg/dL — ABNORMAL HIGH (ref 70–99)
Potassium: 4.8 mmol/L (ref 3.5–5.1)
Sodium: 136 mmol/L (ref 135–145)

## 2024-11-05 LAB — CBC
HCT: 28.4 % — ABNORMAL LOW (ref 36.0–46.0)
Hemoglobin: 8.9 g/dL — ABNORMAL LOW (ref 12.0–15.0)
MCH: 27.7 pg (ref 26.0–34.0)
MCHC: 31.3 g/dL (ref 30.0–36.0)
MCV: 88.5 fL (ref 80.0–100.0)
Platelets: 19 K/uL — CL (ref 150–400)
RBC: 3.21 MIL/uL — ABNORMAL LOW (ref 3.87–5.11)
RDW: 16.3 % — ABNORMAL HIGH (ref 11.5–15.5)
WBC: 7.8 K/uL (ref 4.0–10.5)
nRBC: 12 % — ABNORMAL HIGH (ref 0.0–0.2)

## 2024-11-05 MED ORDER — BUDESON-GLYCOPYRROL-FORMOTEROL 160-9-4.8 MCG/ACT IN AERO
2.0000 | INHALATION_SPRAY | Freq: Two times a day (BID) | RESPIRATORY_TRACT | 0 refills | Status: DC
  Filled 2024-11-05: qty 10.7, 30d supply, fill #0

## 2024-11-05 MED ORDER — DILTIAZEM HCL ER COATED BEADS 240 MG PO CP24
240.0000 mg | ORAL_CAPSULE | Freq: Every day | ORAL | 0 refills | Status: AC
  Filled 2024-11-05: qty 90, 90d supply, fill #0

## 2024-11-05 MED ORDER — AMIODARONE HCL 200 MG PO TABS
200.0000 mg | ORAL_TABLET | Freq: Two times a day (BID) | ORAL | 0 refills | Status: AC
  Filled 2024-11-05: qty 60, 30d supply, fill #0

## 2024-11-05 MED ORDER — CALCITONIN (SALMON) 200 UNIT/ACT NA SOLN
1.0000 | Freq: Every day | NASAL | 0 refills | Status: DC
  Filled 2024-11-05: qty 3.7, 41d supply, fill #0
  Filled 2024-11-05: qty 3.7, 34d supply, fill #0

## 2024-11-05 NOTE — Plan of Care (Signed)
" °  Daily Progress Note   Patient Name: Carla Hanson       Date: 11/05/2024 DOB: 1966-02-25  Age: 59 y.o. MRN#: [id number] Attending Physician: Jadine Toribio SQUIBB, MD Primary Care Physician: Anders Otto DASEN, MD Admit Date: 10/20/2024 Length of Stay: 10 days  Discussed care with primary hospitalist today.  Patient has decided she does not want to go to SNF for rehab and instead would want to go home with home health.  Planning for discharge today.  Patient will follow-up with outpatient oncologist at [facility].  Has already been referred to outpatient palliative medicine team at [facility] as well.  Please reach out if acute PMT needs arise.  Thank you.   Tinnie Radar, DO Palliative Care Provider PMT # [phone]  No Charge Note  "

## 2024-11-05 NOTE — Plan of Care (Signed)

## 2024-11-05 NOTE — Discharge Summary (Addendum)
 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 *  Hospital Course: 59 year old woman PMH including multiple myeloma, intracranial hemorrhage on Xarelto , presented with symptomatic anemia.  Underwent multiple transfusions, seen by oncology, underwent repeat bone marrow biopsy.  Eventually hemoglobin and platelets stabilized.  Oncology plans referral to [facility] for consideration of BITE therapy.  Hospitalization was complicated by new diagnosis atrial flutter with rapid ventricular response which was difficult to control.  Seen by cardiology.  Eventually rate was controlled, blood counts stabilized.  Plan was for SNF but patient eventually elected to go home with home health.  She will follow-up with oncology next week and with her PCP.  Consultants Oncology IR Cardiology  Palliative Care  Procedures/Events 12/17 admission for anemia 12/24 fluoro RT ILIAC BM ASP AND CORE  12/26 new diagnosis aflutter RVR  Multiple myeloma not having achieved remission Symptomatic anemia Thrombocytopenia Iron deficiency anemia Folate deficiency Status post 5 units PRBC this  admission Transfuse for hemoglobin less than 7 Holding chemotherapy for now while attending SNF.  Plan for outpatient referral to [facility] for consideration of BITE therapy, PET/CT as outpatient. Transfuse if platelet count falls below 10,000 or above 50,000 if bleeding Follow-up with oncology next week, have communicated with Dr. Lonn who will arrange   Hypercalcemia of malignancy Hypercalcemia was treated with Zometa  12/18, receives every 28 days.  Continue calcitonin. Calcium  normalized   Atrial flutter with RVR New diagnosis.  TSH within normal limits.  Seen by cardiology.  Rate control was difficult.  Treated with amiodarone  and diltiazem  IV. 2D echocardiogram normal LVEF, no wall motion abnormalities. Plan short course of amiodarone  oral for 30 days, long-acting diltiazem . Not a candidate for anticoagulation secondary to thrombocytopenia and history of intracranial hemorrhage   Tiny pleural effusions   Generalized deconditioning Generalized weakness Plan SNF   PMH PE, DVT Aspirin . No anticoagulation given history of intracranial hemorrhage on Xarelto    Class 2 obesity Body mass index is 37.95 kg/m.  Disposition: Home health Diet recommendation:  Diet Orders (From admission, onward)     Start     Ordered   10/27/24 1042  Diet regular Room service appropriate? Yes; Fluid consistency: Thin  Diet effective now       Question Answer Comment  Room service appropriate? Yes   Fluid consistency: Thin      10/27/24 1041            DISCHARGE MEDICATION: Allergies as of 11/05/2024       Reactions   Latuda  [lurasidone ] Other (See Comments)   Made leg muscles twitch (went off this herself in December, 2017)  Medication List     STOP taking these medications    aspirin  EC 81 MG tablet   carvedilol  3.125 MG tablet Commonly known as: COREG    LORazepam  0.5 MG tablet Commonly known as: ATIVAN    losartan  25 MG tablet Commonly known as: COZAAR     pantoprazole  40 MG tablet Commonly known as: PROTONIX        TAKE these medications    acyclovir  400 MG tablet Commonly known as: ZOVIRAX  Take 1 tablet (400 mg total) by mouth 2 (two) times daily.   albuterol  (2.5 MG/3ML) 0.083% nebulizer solution Commonly known as: PROVENTIL  Take 3 mLs (2.5 mg total) by nebulization every 6 (six) hours as needed for wheezing or shortness of breath.   Ventolin  HFA 108 (90 Base) MCG/ACT inhaler Generic drug: albuterol  Inhale 2 puffs into the lungs every 6 (six) hours as needed for wheezing or shortness of breath.   amiodarone  200 MG tablet Commonly known as: PACERONE  Take 1 tablet (200 mg total) by mouth 2 (two) times daily.   calcitonin (salmon) 200 UNIT/ACT nasal spray Commonly known as: MIACALCIN /FORTICAL Place 1 spray into alternate nostrils daily. Start taking on: November 06, 2024   cetirizine  10 MG tablet Commonly known as: ZYRTEC  Take 1 tablet (10 mg total) by mouth daily. What changed:  when to take this reasons to take this   diltiazem  240 MG 24 hr capsule Commonly known as: CARDIZEM  CD Take 1 capsule (240 mg total) by mouth daily. Start taking on: November 06, 2024   ferrous sulfate  325 (65 FE) MG tablet Take 325 mg by mouth daily with breakfast.   folic acid  1 MG tablet Commonly known as: FOLVITE  Take 1 tablet (1 mg total) by mouth 2 (two) times daily.   potassium chloride  SA 20 MEQ tablet Commonly known as: KLOR-CON  M Take 1 tablet (20 mEq total) by mouth 2 (two) times daily. What changed: how much to take   prochlorperazine  10 MG tablet Commonly known as: COMPAZINE  Take 1 tablet (10 mg total) by mouth every 6 (six) hours as needed for nausea or vomiting   Systane Ultra 0.4-0.3 % Soln Generic drug: Polyethyl Glycol-Propyl Glycol Place 1 drop into both eyes 3 (three) times daily as needed (for dryness).   thiamine  100 MG tablet Commonly known as: VITAMIN B1 Take 1 tablet (100 mg total) by mouth daily.    tiZANidine  4 MG tablet Commonly known as: Zanaflex  Take 1 tablet (4 mg total) by mouth 2 (two) times daily as needed for muscle spasms.   TYLENOL  500 MG tablet Generic drug: acetaminophen  Take 500-1,000 mg by mouth every 6 (six) hours as needed for mild pain (pain score 1-3) (or headaches). What changed: Another medication with the same name was removed. Continue taking this medication, and follow the directions you see here.               Durable Medical Equipment  (From admission, onward)           Start     Ordered   11/05/24 1258  For home use only DME 3 n 1  Once        11/05/24 1257            Contact information for follow-up providers     [employer] (DME) Follow up.   Specialty: DME Services Why: Programmer, Multimedia information: [address] [place]  [address] [phone]        Anders Otto DASEN, MD. Schedule an appointment  as soon as possible for a visit in 1 week(s).   Specialty: Family Medicine Contact information: [address] [place] [address] [phone]         Onesimo Emaline Brink, MD Follow up.   Specialties: Hematology, Oncology Why: Office will call you to set up an appointment Contact information: [address] [place] [address] [phone]              Contact information for after-discharge care     Home Medical Care     [facility] [place] [facility]) .   Service: Home Health Services Why: HHRN/PT/OT/aide Contact information: [address] [place] [address] [place]  [address] [phone]                    Discharge Exam: Fredricka Weights   10/20/24 1548  Weight: 92.6 kg   Physical Exam Vitals reviewed.  Constitutional:      General: She is not in acute distress.    Appearance: She is not ill-appearing or toxic-appearing.  Cardiovascular:     Rate and Rhythm: Normal rate and regular rhythm.      Heart sounds: No murmur heard. Pulmonary:     Effort: Pulmonary effort is normal. No respiratory distress.     Breath sounds: No wheezing, rhonchi or rales.  Neurological:     Mental Status: She is alert.  Psychiatric:        Mood and Affect: Mood normal.        Behavior: Behavior normal.      Condition at discharge: good  The results of significant diagnostics from this hospitalization (including imaging, microbiology, ancillary and laboratory) are listed below for reference.   Imaging Studies: DG CHEST PORT 1 VIEW Result Date: 10/31/2024 CLINICAL DATA:  Pleural effusion.  Multiple myeloma. EXAM: PORTABLE CHEST 1 VIEW COMPARISON:  10/26/2024 FINDINGS: Stable cardiomegaly and pulmonary vascular congestion. Stable mild pleural thickening or tiny pleural effusions. No significant change in left lower lobe atelectasis or scarring. No new or worsening areas of pulmonary opacity are seen. IMPRESSION: Stable cardiomegaly and pulmonary vascular congestion. Stable left lower lobe atelectasis or scarring. Stable tiny bilateral pleural effusions versus pleural thickening. Electronically Signed   By: Norleen DELENA Kil M.D.   On: 10/31/2024 09:54   ECHOCARDIOGRAM COMPLETE Result Date: 10/30/2024    ECHOCARDIOGRAM REPORT   Patient Name:   RAELAN BURGOON [facility] Date of Exam: 10/30/2024 Medical Rec #:  [id number]      Height:       61.5 in Accession #:    [id number]     Weight:       204.1 lb Date of Birth:  16-Nov-1965     BSA:          1.917 m Patient Age:    58 years       BP:           126/70 mmHg Patient Gender: F              HR:           86 bpm. Exam Location:  Inpatient Procedure: 2D Echo, Cardiac Doppler, Color Doppler and Intracardiac            Opacification Agent (Both Spectral and Color Flow Doppler were            utilized during procedure). Indications:    Atrial Flutter  History:        Patient has prior history  of Echocardiogram examinations, most                 recent 12/05/2021.  Sonographer:     Philomena Daring Referring Phys: [id number] JOETTE PEBBLES IMPRESSIONS  1. Left ventricular ejection fraction, by estimation, is 60 to 65%. The left ventricle has normal function. The left ventricle has no regional wall motion abnormalities. Left ventricular diastolic parameters are indeterminate.  2. Right ventricular systolic function is normal. The right ventricular size is mildly enlarged. There is mildly elevated pulmonary artery systolic pressure. The estimated right ventricular systolic pressure is 39.9 mmHg.  3. Large pleural effusion in the left lateral region.  4. The mitral valve was not well visualized. No evidence of mitral valve regurgitation.  5. The aortic valve is tricuspid. Aortic valve regurgitation is not visualized. No aortic stenosis is present.  6. The inferior vena cava is dilated in size with <50% respiratory variability, suggesting right atrial pressure of 15 mmHg. Comparison(s): Prior images reviewed side by side. 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 FINDINGS  Left Ventricle: Left ventricular ejection fraction, by estimation, is 60 to 65%. The left ventricle has normal function. The left ventricle has no regional wall motion abnormalities. The left ventricular internal cavity size was normal in size. There is  no left ventricular hypertrophy. Left ventricular diastolic parameters are indeterminate. Right Ventricle: The right ventricular size is mildly enlarged. No increase in right ventricular wall thickness. Right ventricular systolic function is normal. There is mildly elevated pulmonary artery systolic pressure. The tricuspid regurgitant velocity is 2.23 m/s, and with an assumed right atrial pressure of 20 mmHg, the estimated right ventricular systolic pressure is 39.9 mmHg. Left Atrium: Left atrial size was normal in size. Right Atrium: Right atrial size was normal in size. Pericardium: There is no evidence of pericardial effusion. Mitral Valve: The mitral valve was not well  visualized. No evidence of mitral valve regurgitation. Tricuspid Valve: The tricuspid valve is normal in structure. Tricuspid valve regurgitation is trivial. No evidence of tricuspid stenosis. Aortic Valve: The aortic valve is tricuspid. Aortic valve regurgitation is not visualized. No aortic stenosis is present. Aortic valve mean gradient measures 9.0 mmHg. Aortic valve peak gradient measures 18.6 mmHg. Aortic valve area, by VTI measures 2.62  cm. Pulmonic Valve: The pulmonic valve was normal in structure. Pulmonic valve regurgitation is trivial. No evidence of pulmonic stenosis. Aorta: The aortic root and ascending aorta are structurally normal, with no evidence of dilitation. Venous: The inferior vena cava is dilated in size with less than 50% respiratory variability, suggesting right atrial pressure of 15 mmHg. IAS/Shunts: No atrial level shunt detected by color flow Doppler. Additional Comments: There is a large pleural effusion in the left lateral region.  LEFT VENTRICLE PLAX 2D LVIDd:         5.00 cm   Diastology LVIDs:         3.40 cm   LV e' medial:    7.72 cm/s LV PW:         1.00 cm   LV E/e' medial:  12.9 LV IVS:        0.90 cm   LV e' lateral:   11.00 cm/s LVOT diam:     2.00 cm   LV E/e' lateral: 9.1 LV SV:         87 LV SV Index:   45 LVOT Area:     3.14 cm  RIGHT VENTRICLE  IVC RV S prime:     20.40 cm/s  IVC diam: 2.60 cm TAPSE (M-mode): 3.2 cm LEFT ATRIUM             Index        RIGHT ATRIUM           Index LA Vol (A2C):   53.7 ml 28.01 ml/m  RA Area:     17.00 cm LA Vol (A4C):   34.2 ml 17.84 ml/m  RA Volume:   39.90 ml  20.82 ml/m LA Biplane Vol: 43.6 ml 22.75 ml/m  AORTIC VALVE AV Area (Vmax):    2.51 cm AV Area (Vmean):   2.50 cm AV Area (VTI):     2.62 cm AV Vmax:           215.50 cm/s AV Vmean:          137.500 cm/s AV VTI:            0.333 m AV Peak Grad:      18.6 mmHg AV Mean Grad:      9.0 mmHg LVOT Vmax:         172.00 cm/s LVOT Vmean:        109.500 cm/s LVOT VTI:           0.278 m LVOT/AV VTI ratio: 0.83  AORTA Ao Root diam: 3.20 cm Ao Asc diam:  3.00 cm MITRAL VALVE                TRICUSPID VALVE MV Area (PHT): 4.71 cm     TR Peak grad:   19.9 mmHg MV Decel Time: 161 msec     TR Vmax:        223.00 cm/s MV E velocity: 99.80 cm/s MV A velocity: 101.00 cm/s  SHUNTS MV E/A ratio:  0.99         Systemic VTI:  0.28 m                             Systemic Diam: 2.00 cm Stanly Leavens MD Electronically signed by Stanly Leavens MD Signature Date/Time: 10/30/2024/3:14:55 PM    Final    IR BONE MARROW BIOPSY & ASPIRATION Result Date: 10/27/2024 INDICATION: Myeloma, reassess marrow status EXAM: FLUOROSCOPICALLY GUIDED RIGHT ILIAC BONE MARROW ASPIRATION AND CORE BIOPSY Date:  10/27/2024 10/27/2024 10:19 am Radiologist:  CHRISTELLA. Frederic Specking, MD Guidance: Fluoroscopy FLUOROSCOPY: Fluoroscopy Time:  (56 mGy). MEDICATIONS: 1% lidocaine  local ANESTHESIA/SEDATION: 1.5 mg IV Versed ; 100 mcg IV Fentanyl  Moderate Sedation Time:  11 minutes The patient was continuously monitored during the procedure by the interventional radiology nurse under my direct supervision. CONTRAST:  None. COMPLICATIONS: None immediate PROCEDURE: Informed consent was obtained from the patient following explanation of the procedure, risks, benefits and alternatives. The patient understands, agrees and consents for the procedure. All questions were addressed. A time out was performed. The patient was positioned prone and fluoroscopic localization was performed of the pelvis to demonstrate the iliac marrow spaces. Maximal barrier sterile technique utilized including caps, mask, sterile gowns, sterile gloves, large sterile drape, hand hygiene, and Betadine prep. Under sterile conditions and local anesthesia, an 11 gauge coaxial bone biopsy needle was advanced into the right iliac marrow space. Needle position was confirmed with fluoroscopic imaging. Initially, bone marrow aspiration was performed. Next, the 11  gauge outer cannula was utilized to obtain a right iliac bone marrow core biopsy. Needle was removed. Hemostasis was obtained with compression. The  patient tolerated the procedure well. Samples were prepared with the cytotechnologist. No immediate complications. IMPRESSION: Fluoroscopically guided right iliac bone marrow aspiration and core biopsy. Electronically Signed   By: CHRISTELLA.  Shick M.D.   On: 10/27/2024 10:31   DG CHEST PORT 1 VIEW Result Date: 10/26/2024 CLINICAL DATA:  Dyspnea. EXAM: PORTABLE CHEST 1 VIEW COMPARISON:  09/08/2024 FINDINGS: The heart is enlarged. Mediastinal contours are stable. Vascular congestion. There are small bilateral pleural effusions. Ill-defined bilateral infrahilar opacities. No pneumothorax. IMPRESSION: 1. Cardiomegaly with vascular congestion and small bilateral pleural effusions. 2. Ill-defined bilateral infrahilar opacities, atelectasis versus pneumonia. Electronically Signed   By: Andrea Gasman M.D.   On: 10/26/2024 16:52    Microbiology: Results for orders placed or performed during the hospital encounter of 09/08/24  Culture, blood (Routine x 2)     Status: None   Collection Time: 09/08/24  3:45 PM   Specimen: BLOOD  Result Value Ref Range Status   Specimen Description   Final    BLOOD RIGHT ANTECUBITAL Performed at [facility], [address], [place], [place] [address]    Special Requests   Final    BOTTLES DRAWN AEROBIC AND ANAEROBIC Blood Culture results may not be optimal due to an inadequate volume of blood received in culture bottles Performed at [facility], [address] [place], [place] [address]    Culture   Final    NO GROWTH 5 DAYS Performed at [facility], [address] [place], [place] [address]    Report Status 09/13/2024 FINAL  Final  Culture, blood (Routine x 2)     Status: None   Collection Time: 09/08/24  3:49 PM   Specimen: BLOOD  Result Value Ref Range Status   Specimen  Description   Final    BLOOD BLOOD RIGHT FOREARM Performed at [facility], [address] [place], [place] [address]    Special Requests   Final    BOTTLES DRAWN AEROBIC AND ANAEROBIC Blood Culture results may not be optimal due to an inadequate volume of blood received in culture bottles Performed at [facility], [address], [place], [place] [address]    Culture   Final    NO GROWTH 5 DAYS Performed at [facility], [address], [place], [place] [address]    Report Status 09/13/2024 FINAL  Final  Resp panel by RT-PCR (RSV, Flu A&B, Covid)     Status: None   Collection Time: 09/08/24  4:05 PM   Specimen: Nasal Swab  Result Value Ref Range Status   SARS Coronavirus 2 by RT PCR NEGATIVE NEGATIVE Final    Comment: (NOTE) SARS-CoV-2 target nucleic acids are NOT DETECTED.  The SARS-CoV-2 RNA is generally detectable in upper respiratory specimens during the acute phase of infection. The lowest concentration of SARS-CoV-2 viral copies this assay can detect is 138 copies/mL. A negative result does not preclude SARS-Cov-2 infection and should not be used as the sole basis for treatment or other patient management decisions. A negative result may occur with  improper specimen collection/handling, submission of specimen other than nasopharyngeal swab, presence of viral mutation(s) within the areas targeted by this assay, and inadequate number of viral copies(<138 copies/mL). A negative result must be combined with clinical observations, patient history, and epidemiological information. The expected result is Negative.  Fact Sheet for Patients:  bloggercourse.com  Fact Sheet for Healthcare Providers:  seriousbroker.it  This test is no t  yet approved or cleared by the [place]  FDA and  has been authorized for detection and/or diagnosis of SARS-CoV-2 by FDA under an Emergency Use  Authorization (EUA). This EUA will remain  in effect (meaning this test can be used) for the duration of the COVID-19 declaration under Section 564(b)(1) of the Act, 21 U.S.C.section 360bbb-3(b)(1), unless the authorization is terminated  or revoked sooner.       Influenza A by PCR NEGATIVE NEGATIVE Final   Influenza B by PCR NEGATIVE NEGATIVE Final    Comment: (NOTE) The Xpert Xpress SARS-CoV-2/FLU/RSV plus assay is intended as an aid in the diagnosis of influenza from Nasopharyngeal swab specimens and should not be used as a sole basis for treatment. Nasal washings and aspirates are unacceptable for Xpert Xpress SARS-CoV-2/FLU/RSV testing.  Fact Sheet for Patients: bloggercourse.com  Fact Sheet for Healthcare Providers: seriousbroker.it  This test is not yet approved or cleared by the [place]  FDA and has been authorized for detection and/or diagnosis of SARS-CoV-2 by FDA under an Emergency Use Authorization (EUA). This EUA will remain in effect (meaning this test can be used) for the duration of the COVID-19 declaration under Section 564(b)(1) of the Act, 21 U.S.C. section 360bbb-3(b)(1), unless the authorization is terminated or revoked.     Resp Syncytial Virus by PCR NEGATIVE NEGATIVE Final    Comment: (NOTE) Fact Sheet for Patients: bloggercourse.com  Fact Sheet for Healthcare Providers: seriousbroker.it  This test is not yet approved or cleared by the [place]  FDA and has been authorized for detection and/or diagnosis of SARS-CoV-2 by FDA under an Emergency Use Authorization (EUA). This EUA will remain in effect (meaning this test can be used) for the duration of the COVID-19 declaration under Section 564(b)(1) of the Act, 21 U.S.C. section 360bbb-3(b)(1), unless the authorization is terminated or revoked.  Performed at [facility],  [address] [place], [place] [address]   Urine Culture (for pregnant, neutropenic or urologic patients or patients with an indwelling urinary catheter)     Status: Abnormal   Collection Time: 09/08/24  5:28 PM   Specimen: Urine, Clean Catch  Result Value Ref Range Status   Specimen Description   Final    URINE, CLEAN CATCH Performed at [facility], [address] [place], [place] [address]    Special Requests   Final    NONE Performed at [facility], [address], [place], [place] [address]    Culture MULTIPLE SPECIES PRESENT, SUGGEST RECOLLECTION (A)  Final   Report Status 09/10/2024 FINAL  Final   *Note: Due to a large number of results and/or encounters for the requested time period, some results have not been displayed. A complete set of results can be found in Results Review.    Labs: CBC: Recent Labs  Lab 10/31/24 0546 11/01/24 0415 11/02/24 0513 11/03/24 0557 11/05/24 0547  WBC 8.5 8.3 8.2 6.8 7.8  HGB 7.1* 6.8* 8.3* 8.4* 8.9*  HCT 22.0* 22.3* 25.8* 26.2* 28.4*  MCV 87.6 90.3 87.5 87.9 88.5  PLT 18* 17* 18* 16* 19*   Basic Metabolic Panel: Recent Labs  Lab 10/30/24 0546 10/31/24 0546 11/01/24 0415 11/02/24 0513 11/03/24 0557 11/05/24 1036  NA 141 141 139 140 138 136  K 3.5 4.1 4.7 4.3 3.8 4.8  CL 104 106 106 105 103 104  CO2 22 29 25 24 25 22   GLUCOSE 133* 103* 95 102* 92 125*  BUN 12 9 10 11 12  11  CREATININE 0.58 0.56 0.50 0.50 0.44 0.46  CALCIUM  10.0 10.0 9.4 8.9 9.6 10.1  MG 2.0 1.8 1.7 1.8 1.8  --   PHOS 2.4* 2.5 3.6 3.5 2.5  --    Liver Function Tests: Recent Labs  Lab 11/01/24 0415 11/02/24 0513 11/03/24 0557  AST 174* 323* 183*  ALT 7 10 8   ALKPHOS 156* 235* 246*  BILITOT 0.8 0.7 0.8  PROT 6.8 7.2 7.2  ALBUMIN 2.7* 2.8* 2.5*   CBG: No results for input(s): GLUCAP in the last 168 hours.  Discharge time spent: greater than 30 minutes.  Signed: Toribio Door, MD [facility] 11/05/2024 "

## 2024-11-05 NOTE — TOC Transition Note (Addendum)
 Transition of Care [facility]) - Discharge Note   Patient Details  Name: Carla Hanson MRN: [id number] Date of Birth: 01-10-1966  Transition of Care [facility]) CM/SW Contact:  Bascom Service, RN Phone Number: 11/05/2024, 1:12 PM   Clinical Narrative:  Patient declined ST SNF-prefer home w/HHC-[facility] accepted HHRN/PT/OT/aide;[employer] rep jermaine 3n1-to ship to home since patient for d/c by PTAR.  -1:20p-patient already has a 3n1-no dme needed. Confimred address, & family will be 2 home for PTAR. No further CM needs.PTAR called.                                                                             Final next level of care: Home w Home Health Services Barriers to Discharge: No Barriers Identified   Patient Goals and CMS Choice Patient states their goals for this hospitalization and ongoing recovery are:: Declined ST SNF-prefer home w/HHC/dme/PTAR CMS Medicare.gov Compare Post Acute Care list provided to:: Patient Choice offered to / list presented to : Patient [place] ownership interest in [facility].provided to:: Patient    Discharge Placement                       Discharge Plan and Services Additional resources added to the After Visit Summary for   In-house Referral: Clinical Social Work Discharge Planning Services: CM Consult Post Acute Care Choice: Durable Medical Equipment, Home Health (HHC-HHRN/OT/OT/aide;3n1)          DME Arranged: 3-N-1 DME Agency: [employer] Date DME Agency Contacted: 11/05/24 Time DME Agency Contacted: 1312 Representative spoke with at DME Agency: [place] HH Arranged: RN, PT, OT, Nurse's Aide HH Agency: [facility] Date HH Agency Contacted: 11/05/24 Time HH Agency Contacted: 1312 Representative spoke with at [facility] Agency: [facility]  Social Drivers of Health (SDOH) Interventions SDOH Screenings   Food Insecurity: No Food Insecurity (10/20/2024)  Housing: Low Risk (10/20/2024)   Transportation Needs: No Transportation Needs (10/20/2024)  Utilities: Not At Risk (10/20/2024)  Depression (PHQ2-9): Low Risk (09/08/2024)  Financial Resource Strain: Low Risk (05/14/2024)  Physical Activity: Insufficiently Active (05/14/2024)  Social Connections: Moderately Integrated (10/20/2024)  Stress: No Stress Concern Present (05/14/2024)  Tobacco Use: Medium Risk (10/20/2024)     Readmission Risk Interventions    10/26/2024    2:05 PM 02/03/2024    1:14 PM  Readmission Risk Prevention Plan  Transportation Screening Complete Complete  PCP or Specialist Appt within 3-5 Days Complete Complete  HRI or Home Care Consult Complete   Social Work Consult for Recovery Care Planning/Counseling Complete Complete  Palliative Care Screening  Not Applicable  Medication Review Oceanographer) Complete Complete

## 2024-11-07 ENCOUNTER — Other Ambulatory Visit: Payer: Self-pay

## 2024-11-07 ENCOUNTER — Emergency Department (HOSPITAL_COMMUNITY)

## 2024-11-07 ENCOUNTER — Encounter: Payer: Self-pay | Admitting: Hematology

## 2024-11-07 ENCOUNTER — Inpatient Hospital Stay (HOSPITAL_COMMUNITY)
Admission: EM | Admit: 2024-11-07 | Discharge: 2024-11-25 | DRG: 189 | Disposition: A | Discharge status: Home / Self Care | Attending: Internal Medicine | Admitting: Internal Medicine

## 2024-11-07 ENCOUNTER — Encounter (HOSPITAL_COMMUNITY): Payer: Self-pay | Admitting: Internal Medicine

## 2024-11-07 DIAGNOSIS — Z79899 Other long term (current) drug therapy: Secondary | ICD-10-CM | POA: Diagnosis not present

## 2024-11-07 DIAGNOSIS — J910 Malignant pleural effusion: Secondary | ICD-10-CM | POA: Diagnosis present

## 2024-11-07 DIAGNOSIS — Z91128 Patient's intentional underdosing of medication regimen for other reason: Secondary | ICD-10-CM

## 2024-11-07 DIAGNOSIS — Z923 Personal history of irradiation: Secondary | ICD-10-CM

## 2024-11-07 DIAGNOSIS — D649 Anemia, unspecified: Secondary | ICD-10-CM | POA: Diagnosis not present

## 2024-11-07 DIAGNOSIS — D630 Anemia in neoplastic disease: Secondary | ICD-10-CM | POA: Diagnosis present

## 2024-11-07 DIAGNOSIS — C9000 Multiple myeloma not having achieved remission: Principal | ICD-10-CM | POA: Diagnosis present

## 2024-11-07 DIAGNOSIS — R531 Weakness: Secondary | ICD-10-CM | POA: Diagnosis not present

## 2024-11-07 DIAGNOSIS — J9601 Acute respiratory failure with hypoxia: Principal | ICD-10-CM | POA: Diagnosis present

## 2024-11-07 DIAGNOSIS — J90 Pleural effusion, not elsewhere classified: Secondary | ICD-10-CM

## 2024-11-07 DIAGNOSIS — I4891 Unspecified atrial fibrillation: Secondary | ICD-10-CM | POA: Diagnosis present

## 2024-11-07 DIAGNOSIS — R0902 Hypoxemia: Secondary | ICD-10-CM | POA: Diagnosis not present

## 2024-11-07 DIAGNOSIS — S22050A Wedge compression fracture of T5-T6 vertebra, initial encounter for closed fracture: Secondary | ICD-10-CM | POA: Diagnosis not present

## 2024-11-07 DIAGNOSIS — J440 Chronic obstructive pulmonary disease with acute lower respiratory infection: Secondary | ICD-10-CM | POA: Diagnosis present

## 2024-11-07 DIAGNOSIS — Z6841 Body Mass Index (BMI) 40.0 and over, adult: Secondary | ICD-10-CM

## 2024-11-07 DIAGNOSIS — I110 Hypertensive heart disease with heart failure: Secondary | ICD-10-CM | POA: Diagnosis present

## 2024-11-07 DIAGNOSIS — L89322 Pressure ulcer of left buttock, stage 2: Secondary | ICD-10-CM | POA: Diagnosis present

## 2024-11-07 DIAGNOSIS — C787 Secondary malignant neoplasm of liver and intrahepatic bile duct: Secondary | ICD-10-CM | POA: Diagnosis present

## 2024-11-07 DIAGNOSIS — Y92009 Unspecified place in unspecified non-institutional (private) residence as the place of occurrence of the external cause: Secondary | ICD-10-CM | POA: Diagnosis not present

## 2024-11-07 DIAGNOSIS — L8990 Pressure ulcer of unspecified site, unspecified stage: Secondary | ICD-10-CM

## 2024-11-07 DIAGNOSIS — Z751 Person awaiting admission to adequate facility elsewhere: Secondary | ICD-10-CM

## 2024-11-07 DIAGNOSIS — M4854XA Collapsed vertebra, not elsewhere classified, thoracic region, initial encounter for fracture: Secondary | ICD-10-CM | POA: Diagnosis not present

## 2024-11-07 DIAGNOSIS — I4892 Unspecified atrial flutter: Secondary | ICD-10-CM | POA: Diagnosis present

## 2024-11-07 DIAGNOSIS — G9520 Unspecified cord compression: Secondary | ICD-10-CM | POA: Diagnosis not present

## 2024-11-07 DIAGNOSIS — Z87891 Personal history of nicotine dependence: Secondary | ICD-10-CM | POA: Diagnosis not present

## 2024-11-07 DIAGNOSIS — J9600 Acute respiratory failure, unspecified whether with hypoxia or hypercapnia: Secondary | ICD-10-CM | POA: Diagnosis present

## 2024-11-07 DIAGNOSIS — D696 Thrombocytopenia, unspecified: Secondary | ICD-10-CM | POA: Diagnosis present

## 2024-11-07 DIAGNOSIS — Z8249 Family history of ischemic heart disease and other diseases of the circulatory system: Secondary | ICD-10-CM

## 2024-11-07 DIAGNOSIS — Z7189 Other specified counseling: Secondary | ICD-10-CM | POA: Diagnosis not present

## 2024-11-07 DIAGNOSIS — Z86711 Personal history of pulmonary embolism: Secondary | ICD-10-CM

## 2024-11-07 DIAGNOSIS — Z825 Family history of asthma and other chronic lower respiratory diseases: Secondary | ICD-10-CM

## 2024-11-07 DIAGNOSIS — R52 Pain, unspecified: Secondary | ICD-10-CM | POA: Diagnosis not present

## 2024-11-07 DIAGNOSIS — M8458XA Pathological fracture in neoplastic disease, other specified site, initial encounter for fracture: Secondary | ICD-10-CM | POA: Diagnosis present

## 2024-11-07 DIAGNOSIS — Z8673 Personal history of transient ischemic attack (TIA), and cerebral infarction without residual deficits: Secondary | ICD-10-CM

## 2024-11-07 DIAGNOSIS — M4804 Spinal stenosis, thoracic region: Secondary | ICD-10-CM | POA: Diagnosis present

## 2024-11-07 DIAGNOSIS — J189 Pneumonia, unspecified organism: Secondary | ICD-10-CM | POA: Diagnosis present

## 2024-11-07 DIAGNOSIS — E785 Hyperlipidemia, unspecified: Secondary | ICD-10-CM | POA: Diagnosis present

## 2024-11-07 DIAGNOSIS — Z888 Allergy status to other drugs, medicaments and biological substances status: Secondary | ICD-10-CM

## 2024-11-07 DIAGNOSIS — Z515 Encounter for palliative care: Secondary | ICD-10-CM | POA: Diagnosis not present

## 2024-11-07 DIAGNOSIS — Z1152 Encounter for screening for COVID-19: Secondary | ICD-10-CM | POA: Diagnosis not present

## 2024-11-07 DIAGNOSIS — F419 Anxiety disorder, unspecified: Secondary | ICD-10-CM | POA: Diagnosis present

## 2024-11-07 DIAGNOSIS — I5033 Acute on chronic diastolic (congestive) heart failure: Secondary | ICD-10-CM | POA: Diagnosis present

## 2024-11-07 DIAGNOSIS — G992 Myelopathy in diseases classified elsewhere: Secondary | ICD-10-CM | POA: Diagnosis present

## 2024-11-07 DIAGNOSIS — E876 Hypokalemia: Secondary | ICD-10-CM | POA: Diagnosis present

## 2024-11-07 DIAGNOSIS — Z9221 Personal history of antineoplastic chemotherapy: Secondary | ICD-10-CM

## 2024-11-07 DIAGNOSIS — I739 Peripheral vascular disease, unspecified: Secondary | ICD-10-CM | POA: Diagnosis present

## 2024-11-07 DIAGNOSIS — E66813 Obesity, class 3: Secondary | ICD-10-CM | POA: Diagnosis present

## 2024-11-07 DIAGNOSIS — I10 Essential (primary) hypertension: Secondary | ICD-10-CM | POA: Diagnosis present

## 2024-11-07 DIAGNOSIS — D701 Agranulocytosis secondary to cancer chemotherapy: Secondary | ICD-10-CM

## 2024-11-07 DIAGNOSIS — C782 Secondary malignant neoplasm of pleura: Secondary | ICD-10-CM | POA: Diagnosis present

## 2024-11-07 DIAGNOSIS — Z833 Family history of diabetes mellitus: Secondary | ICD-10-CM

## 2024-11-07 DIAGNOSIS — I517 Cardiomegaly: Secondary | ICD-10-CM | POA: Diagnosis not present

## 2024-11-07 DIAGNOSIS — D509 Iron deficiency anemia, unspecified: Secondary | ICD-10-CM | POA: Diagnosis present

## 2024-11-07 DIAGNOSIS — K5900 Constipation, unspecified: Secondary | ICD-10-CM | POA: Diagnosis present

## 2024-11-07 DIAGNOSIS — J449 Chronic obstructive pulmonary disease, unspecified: Secondary | ICD-10-CM | POA: Diagnosis present

## 2024-11-07 DIAGNOSIS — Z7951 Long term (current) use of inhaled steroids: Secondary | ICD-10-CM

## 2024-11-07 DIAGNOSIS — E538 Deficiency of other specified B group vitamins: Secondary | ICD-10-CM | POA: Diagnosis present

## 2024-11-07 DIAGNOSIS — R4589 Other symptoms and signs involving emotional state: Secondary | ICD-10-CM | POA: Diagnosis not present

## 2024-11-07 DIAGNOSIS — Z86718 Personal history of other venous thrombosis and embolism: Secondary | ICD-10-CM

## 2024-11-07 DIAGNOSIS — G893 Neoplasm related pain (acute) (chronic): Secondary | ICD-10-CM | POA: Diagnosis not present

## 2024-11-07 DIAGNOSIS — T502X6A Underdosing of carbonic-anhydrase inhibitors, benzothiadiazides and other diuretics, initial encounter: Secondary | ICD-10-CM | POA: Diagnosis present

## 2024-11-07 LAB — BASIC METABOLIC PANEL WITH GFR
Anion gap: 11 (ref 5–15)
BUN: 12 mg/dL (ref 6–20)
CO2: 27 mmol/L (ref 22–32)
Calcium: 11.4 mg/dL — ABNORMAL HIGH (ref 8.9–10.3)
Chloride: 102 mmol/L (ref 98–111)
Creatinine, Ser: 0.59 mg/dL (ref 0.44–1.00)
GFR, Estimated: >60 mL/min
Glucose, Bld: 110 mg/dL — ABNORMAL HIGH (ref 70–99)
Potassium: 3.3 mmol/L — ABNORMAL LOW (ref 3.5–5.1)
Sodium: 140 mmol/L (ref 135–145)

## 2024-11-07 LAB — RESP PANEL BY RT-PCR (RSV, FLU A&B, COVID)  RVPGX2
Influenza A by PCR: NEGATIVE
Influenza B by PCR: NEGATIVE
Resp Syncytial Virus by PCR: NEGATIVE
SARS Coronavirus 2 by RT PCR: NEGATIVE

## 2024-11-07 LAB — PRO BRAIN NATRIURETIC PEPTIDE: Pro Brain Natriuretic Peptide: 2001 pg/mL — ABNORMAL HIGH

## 2024-11-07 LAB — TROPONIN T, HIGH SENSITIVITY
Troponin T High Sensitivity: 49 ng/L — ABNORMAL HIGH (ref 0–19)
Troponin T High Sensitivity: 49 ng/L — ABNORMAL HIGH (ref 0–19)

## 2024-11-07 MED ORDER — SODIUM CHLORIDE 0.9 % IV SOLN
500.0000 mg | Freq: Once | INTRAVENOUS | Status: AC
  Administered 2024-11-07: 500 mg via INTRAVENOUS
  Filled 2024-11-07: qty 5

## 2024-11-07 MED ORDER — DILTIAZEM HCL ER COATED BEADS 240 MG PO CP24
240.0000 mg | ORAL_CAPSULE | Freq: Every day | ORAL | Status: DC
  Administered 2024-11-08 – 2024-11-25 (×18): 240 mg via ORAL
  Filled 2024-11-07 (×18): qty 1

## 2024-11-07 MED ORDER — FUROSEMIDE 10 MG/ML IJ SOLN
20.0000 mg | Freq: Once | INTRAMUSCULAR | Status: AC
  Administered 2024-11-07: 20 mg via INTRAVENOUS
  Filled 2024-11-07: qty 4

## 2024-11-07 MED ORDER — ACETAMINOPHEN 325 MG PO TABS: 650.0000 mg | ORAL_TABLET | Freq: Four times a day (QID) | ORAL | Status: DC | PRN

## 2024-11-07 MED ORDER — CALCITONIN (SALMON) 200 UNIT/ACT NA SOLN
1.0000 | Freq: Every day | NASAL | Status: DC
  Administered 2024-11-08 – 2024-11-14 (×7): 1 via NASAL
  Filled 2024-11-07: qty 3.7

## 2024-11-07 MED ORDER — POTASSIUM CHLORIDE CRYS ER 20 MEQ PO TBCR
40.0000 meq | EXTENDED_RELEASE_TABLET | Freq: Once | ORAL | Status: AC
  Administered 2024-11-07: 40 meq via ORAL
  Filled 2024-11-07: qty 2

## 2024-11-07 MED ORDER — AMIODARONE HCL 200 MG PO TABS
200.0000 mg | ORAL_TABLET | Freq: Two times a day (BID) | ORAL | Status: DC
  Administered 2024-11-07 – 2024-11-25 (×35): 200 mg via ORAL
  Filled 2024-11-07 (×35): qty 1

## 2024-11-07 MED ORDER — ACETAMINOPHEN 650 MG RE SUPP: 650.0000 mg | Freq: Four times a day (QID) | RECTAL | Status: DC | PRN

## 2024-11-07 MED ORDER — POTASSIUM CHLORIDE CRYS ER 20 MEQ PO TBCR: 40.0000 meq | EXTENDED_RELEASE_TABLET | Freq: Two times a day (BID) | ORAL | Status: DC

## 2024-11-07 MED ORDER — FUROSEMIDE 10 MG/ML IJ SOLN
20.0000 mg | Freq: Two times a day (BID) | INTRAMUSCULAR | Status: AC
  Administered 2024-11-08 – 2024-11-09 (×3): 20 mg via INTRAVENOUS
  Filled 2024-11-07 (×3): qty 2

## 2024-11-07 MED ORDER — FOLIC ACID 1 MG PO TABS
1.0000 mg | ORAL_TABLET | Freq: Two times a day (BID) | ORAL | Status: DC
  Administered 2024-11-07 – 2024-11-25 (×36): 1 mg via ORAL
  Filled 2024-11-07 (×36): qty 1

## 2024-11-07 MED ORDER — SODIUM CHLORIDE 0.9 % IV SOLN
500.0000 mg | INTRAVENOUS | Status: DC
  Filled 2024-11-07: qty 5

## 2024-11-07 MED ORDER — THIAMINE MONONITRATE 100 MG PO TABS
100.0000 mg | ORAL_TABLET | Freq: Every day | ORAL | Status: DC
  Administered 2024-11-08 – 2024-11-25 (×18): 100 mg via ORAL
  Filled 2024-11-07 (×18): qty 1

## 2024-11-07 MED ORDER — THIAMINE HCL 100 MG PO TABS: 100.0000 mg | ORAL_TABLET | Freq: Every day | ORAL | Status: DC

## 2024-11-07 MED ORDER — ACYCLOVIR 400 MG PO TABS
400.0000 mg | ORAL_TABLET | Freq: Two times a day (BID) | ORAL | Status: DC
  Administered 2024-11-07 – 2024-11-25 (×36): 400 mg via ORAL
  Filled 2024-11-07 (×36): qty 1

## 2024-11-07 MED ORDER — FLUTICASONE FUROATE-VILANTEROL 100-25 MCG/ACT IN AEPB
1.0000 | INHALATION_SPRAY | Freq: Every day | RESPIRATORY_TRACT | Status: DC
  Administered 2024-11-08 – 2024-11-25 (×17): 1 via RESPIRATORY_TRACT
  Filled 2024-11-07 (×2): qty 28

## 2024-11-07 MED ORDER — FERROUS SULFATE 325 (65 FE) MG PO TABS
325.0000 mg | ORAL_TABLET | Freq: Every day | ORAL | Status: DC
  Administered 2024-11-08 – 2024-11-25 (×18): 325 mg via ORAL
  Filled 2024-11-07 (×18): qty 1

## 2024-11-07 MED ORDER — SODIUM CHLORIDE 0.9 % IV SOLN
1.0000 g | Freq: Once | INTRAVENOUS | Status: AC
  Administered 2024-11-07: 1 g via INTRAVENOUS
  Filled 2024-11-07: qty 10

## 2024-11-07 MED ORDER — SODIUM CHLORIDE 0.9 % IV SOLN
2.0000 g | INTRAVENOUS | Status: DC
  Administered 2024-11-08 – 2024-11-11 (×4): 2 g via INTRAVENOUS
  Filled 2024-11-07 (×4): qty 20

## 2024-11-07 NOTE — H&P (Incomplete)
 " History and Physical    Carla Hanson FMW:[id number] DOB: 1965-12-07 DOA: 11/07/2024  Patient coming from: Home.  Chief Complaint: Shortness of breath.  HPI: Carla Hanson is a 59 y.o. female with history of multiple myeloma, intracranial hemorrhage while being on Xarelto , recently admitted from 10/20/2024 through 11/05/2024 for symptomatic anemia had required at least 5 units of PRBC transfusion also has severe thrombocytopenia during the stay patient also had new onset A-fib difficult to control was placed on amiodarone  and Cardizem  not on anticoagulation due to history of severe thrombocytopenia and intracranial bleed previously discharged about 2 days ago presents with increasing shortness of breath since yesterday.  Patient states that at baseline Carla Hanson is nonambulatory.  Was getting short of breath more on lying down with no associated productive cough fever chills.  Denies any chest pain.  At home patient's home health aide found that patient was desatting to 74% and had to be placed on oxygen and was referred to ER.  ED Course: In the ER patient was hypoxic requiring 4 L oxygen.  Patient was afebrile.  Chest x-ray showed bilateral pleural effusion more on the left side.  CT chest shows moderate-sized left greater than right pleural effusion with possible partial loculation of the left effusion.  Associated multifocal lobular pleural thickening likely related to myelomatous involvement of the pleural metastasis new since April 2025.  Also has age indeterminant T6 compression fracture with mild cord compression.  Multiple rib fracture age.  proBNP was 2000 troponins were flat at 49.  WBC 12.3 hemoglobin 8.3 platelets 18.  Patient was started on Lasix  antibiotics admitted for further workup.   Review of Systems: As per HPI, rest all negative.   Past Medical History:  Diagnosis Date   Alcohol  abuse    Anxiety    Back pain    Bipolar disorder (HCC)    Bronchitis    COPD exacerbation (HCC)  05/10/2016   Drug use    Edema 02/24/2024   History of blood clots    Hyperlipidemia    Hypertension    Influenza A 12/01/2018   Joint pain    Left leg DVT (HCC) 09/29/2013   Lipoma    Abdomen   LIPOMA 01/20/2008   Obesity    Occasional tremors 05/12/2019   Osteoarthritis of right knee    Other fatigue    Painful menstrual periods 01/05/2012   Pneumonia    Prediabetes    Seizure (HCC)    Shortness of breath on exertion    Stroke (HCC)    Tobacco abuse     Past Surgical History:  Procedure Laterality Date   CESAREAN SECTION     CYSTECTOMY     IR BONE MARROW BIOPSY & ASPIRATION  10/27/2024   LIPOMA EXCISION  03/2011   ORIF ANKLE FRACTURE  03/13/2012   Procedure: OPEN REDUCTION INTERNAL FIXATION (ORIF) ANKLE FRACTURE;  Surgeon: Lynwood FORBES Better, MD;  Location: [facility] ORS;  Service: Orthopedics;  Laterality: Right;   TUBAL LIGATION       reports that Carla Hanson quit smoking about 8 years ago. Carla Hanson smoking use included cigarettes. Carla Hanson started smoking about 43 years ago. Carla Hanson has a 17.5 pack-year smoking history. Carla Hanson has never used smokeless tobacco. Carla Hanson reports current drug use. Drug: Marijuana. Carla Hanson reports that Carla Hanson does not drink alcohol .  Allergies[1]  Family History  Problem Relation Age of Onset   Other Mother        High blood pressure runs in the family  Obesity Mother    Asthma Father    Heart attack Father    Obesity Father    Diabetes Maternal Aunt    Breast cancer Neg Hx     Prior to Admission medications  Medication Sig Start Date End Date Taking? Authorizing Provider  acyclovir  (ZOVIRAX ) 400 MG tablet Take 1 tablet (400 mg total) by mouth 2 (two) times daily. 08/06/24  Yes Onesimo Emaline Brink, MD  albuterol  (PROVENTIL ) (2.5 MG/3ML) 0.083% nebulizer solution Take 3 mLs (2.5 mg total) by nebulization every 6 (six) hours as needed for wheezing or shortness of breath. 02/06/24  Yes Anders Otto DASEN, MD  albuterol  (VENTOLIN  HFA) 108 (90 Base) MCG/ACT inhaler Inhale 2 puffs  into the lungs every 6 (six) hours as needed for wheezing or shortness of breath. 10/18/24  Yes Anders Otto DASEN, MD  amiodarone  (PACERONE ) 200 MG tablet Take 1 tablet (200 mg total) by mouth 2 (two) times daily. 11/05/24  Yes Jadine Toribio SQUIBB, MD  calcitonin, salmon, (MIACALCIN MARCIANA) 200 UNIT/ACT nasal spray Place 1 spray into alternate nostrils daily. 11/06/24  Yes Jadine Toribio SQUIBB, MD  cetirizine  (ZYRTEC ) 10 MG tablet Take 1 tablet (10 mg total) by mouth daily. Patient taking differently: Take 10 mg by mouth daily as needed for rhinitis or allergies. 08/06/24  Yes Anders Otto DASEN, MD  diltiazem  (CARDIZEM  CD) 240 MG 24 hr capsule Take 1 capsule (240 mg total) by mouth daily. 11/06/24 02/04/25 Yes Jadine Toribio SQUIBB, MD  ferrous sulfate  325 (65 FE) MG tablet Take 325 mg by mouth daily with breakfast.   Yes [provider]  folic acid  (FOLVITE ) 1 MG tablet Take 1 tablet (1 mg total) by mouth 2 (two) times daily. 09/15/24  Yes Onesimo Emaline Brink, MD  potassium chloride  SA (KLOR-CON  M) 20 MEQ tablet Take 1 tablet (20 mEq total) by mouth 2 (two) times daily. Patient taking differently: Take 40 mEq by mouth 2 (two) times daily. 10/18/24 11/21/24 Yes Onesimo Emaline Brink, MD  prochlorperazine  (COMPAZINE ) 10 MG tablet Take 1 tablet (10 mg total) by mouth every 6 (six) hours as needed for nausea or vomiting 07/06/24  Yes Onesimo Emaline Brink, MD  SYMBICORT  160-4.5 MCG/ACT inhaler Inhale into the lungs. 11/05/24  Yes [provider]  SYSTANE ULTRA 0.4-0.3 % SOLN Place 1 drop into both eyes 3 (three) times daily as needed (for dryness).   Yes [provider]  thiamine  (VITAMIN B1) 100 MG tablet Take 1 tablet (100 mg total) by mouth daily. 09/15/24  Yes Neomi Lis T, PA-C  TYLENOL  500 MG tablet Take 500-1,000 mg by mouth every 6 (six) hours as needed for mild pain (pain score 1-3) (or headaches).   Yes [provider]  tiZANidine  (ZANAFLEX ) 4 MG tablet Take 1 tablet (4 mg  total) by mouth 2 (two) times daily as needed for muscle spasms. Patient not taking: Reported on 11/07/2024 08/06/24   Anders Otto DASEN, MD  rosuvastatin  (CRESTOR ) 20 MG tablet Take 1 tablet (20 mg total) by mouth daily. 05/20/24 10/06/24  Anders Otto DASEN, MD    Physical Exam: Constitutional: Moderately built and nourished. Vitals:   11/07/24 1352 11/07/24 1445 11/07/24 1645 11/07/24 1900  BP: 139/72 130/76 122/78 122/78  Pulse: (!) 102 96 96 87  Resp: (!) 22 18 20  (!) 24  Temp: 98.3 F (36.8 C) 98.2 F (36.8 C) 98.3 F (36.8 C) 98.5 F (36.9 C)  TempSrc: Oral Oral Oral   SpO2: 95% 100% 100% 100%   Eyes:  Anicteric no pallor. ENMT: No discharge from the ears eyes nose or mouth. Neck: No mass felt.  No neck rigidity. Respiratory: No rhonchi or crepitations. Cardiovascular: S1-S2 heard. Abdomen: Soft nontender bowel sound present. Musculoskeletal: No edema. Skin: No rash. Neurologic: Alert awake oriented to time place and person.  Moves all extremities. Psychiatric: Appears normal.  Normal affect.   Labs on Admission: I have personally reviewed following labs and imaging studies  CBC: Recent Labs  Lab 11/01/24 0415 11/02/24 0513 11/03/24 0557 11/05/24 0547 11/07/24 1643  WBC 8.3 8.2 6.8 7.8 12.3*  NEUTROABS  --   --   --   --  4.9  HGB 6.8* 8.3* 8.4* 8.9* 8.3*  HCT 22.3* 25.8* 26.2* 28.4* 26.6*  MCV 90.3 87.5 87.9 88.5 88.7  PLT 17* 18* 16* 19* 18*   Basic Metabolic Panel: Recent Labs  Lab 11/01/24 0415 11/02/24 0513 11/03/24 0557 11/05/24 1036 11/07/24 1643  NA 139 140 138 136 140  K 4.7 4.3 3.8 4.8 3.3*  CL 106 105 103 104 102  CO2 25 24 25 22 27   GLUCOSE 95 102* 92 125* 110*  BUN 10 11 12 11 12   CREATININE 0.50 0.50 0.44 0.46 0.59  CALCIUM  9.4 8.9 9.6 10.1 11.4*  MG 1.7 1.8 1.8  --   --   PHOS 3.6 3.5 2.5  --   --    GFR: CrCl cannot be calculated (Unknown ideal weight.). Liver Function Tests: Recent Labs  Lab 11/01/24 0415 11/02/24 0513  11/03/24 0557  AST 174* 323* 183*  ALT 7 10 8   ALKPHOS 156* 235* 246*  BILITOT 0.8 0.7 0.8  PROT 6.8 7.2 7.2  ALBUMIN 2.7* 2.8* 2.5*   No results for input(s): LIPASE, AMYLASE in the last 168 hours. No results for input(s): AMMONIA in the last 168 hours. Coagulation Profile: No results for input(s): INR, PROTIME in the last 168 hours. Cardiac Enzymes: No results for input(s): CKTOTAL, CKMB, CKMBINDEX, TROPONINI in the last 168 hours. BNP (last 3 results) Recent Labs    11/07/24 1643  PROBNP 2,001.0*   HbA1C: No results for input(s): HGBA1C in the last 72 hours. CBG: No results for input(s): GLUCAP in the last 168 hours. Lipid Profile: No results for input(s): CHOL, HDL, LDLCALC, TRIG, CHOLHDL, LDLDIRECT in the last 72 hours. Thyroid  Function Tests: No results for input(s): TSH, T4TOTAL, FREET4, T3FREE, THYROIDAB in the last 72 hours. Anemia Panel: No results for input(s): VITAMINB12, FOLATE, FERRITIN, TIBC, IRON, RETICCTPCT in the last 72 hours. Urine analysis:    Component Value Date/Time   COLORURINE YELLOW 09/08/2024 1728   APPEARANCEUR CLOUDY (A) 09/08/2024 1728   LABSPEC 1.020 09/08/2024 1728   PHURINE 5.0 09/08/2024 1728   GLUCOSEU NEGATIVE 09/08/2024 1728   HGBUR NEGATIVE 09/08/2024 1728   BILIRUBINUR NEGATIVE 09/08/2024 1728   BILIRUBINUR NEG 12/29/2013 0900   KETONESUR NEGATIVE 09/08/2024 1728   PROTEINUR 30 (A) 09/08/2024 1728   UROBILINOGEN 0.2 12/29/2013 0900   UROBILINOGEN 0.2 03/07/2011 1430   NITRITE NEGATIVE 09/08/2024 1728   LEUKOCYTESUR NEGATIVE 09/08/2024 1728   Sepsis Labs: @LABRCNTIP (procalcitonin:4,lacticidven:4) ) Recent Results (from the past 240 hours)  Resp panel by RT-PCR (RSV, Flu A&B, Covid) Anterior Nasal Swab     Status: None   Collection Time: 11/07/24  5:00 PM   Specimen: Anterior Nasal Swab  Result Value Ref Range Status   SARS Coronavirus 2 by RT PCR NEGATIVE NEGATIVE  Final    Comment: (NOTE) SARS-CoV-2 target nucleic acids are NOT DETECTED.  The SARS-CoV-2 RNA is generally detectable in upper respiratory specimens during the acute phase of infection. The lowest concentration of SARS-CoV-2 viral copies this assay can detect is 138 copies/mL. A negative result does not preclude SARS-Cov-2 infection and should not be used as the sole basis for treatment or other patient management decisions. A negative result may occur with  improper specimen collection/handling, submission of specimen other than nasopharyngeal swab, presence of viral mutation(s) within the areas targeted by this assay, and inadequate number of viral copies(<138 copies/mL). A negative result must be combined with clinical observations, patient history, and epidemiological information. The expected result is Negative.  Fact Sheet for Patients:  bloggercourse.com  Fact Sheet for Healthcare Providers:  seriousbroker.it  This test is no t yet approved or cleared by the [place]  FDA and  has been authorized for detection and/or diagnosis of SARS-CoV-2 by FDA under an Emergency Use Authorization (EUA). This EUA will remain  in effect (meaning this test can be used) for the duration of the COVID-19 declaration under Section 564(b)(1) of the Act, 21 U.S.C.section 360bbb-3(b)(1), unless the authorization is terminated  or revoked sooner.       Influenza A by PCR NEGATIVE NEGATIVE Final   Influenza B by PCR NEGATIVE NEGATIVE Final    Comment: (NOTE) The Xpert Xpress SARS-CoV-2/FLU/RSV plus assay is intended as an aid in the diagnosis of influenza from Nasopharyngeal swab specimens and should not be used as a sole basis for treatment. Nasal washings and aspirates are unacceptable for Xpert Xpress SARS-CoV-2/FLU/RSV testing.  Fact Sheet for Patients: bloggercourse.com  Fact Sheet for Healthcare  Providers: seriousbroker.it  This test is not yet approved or cleared by the [place]  FDA and has been authorized for detection and/or diagnosis of SARS-CoV-2 by FDA under an Emergency Use Authorization (EUA). This EUA will remain in effect (meaning this test can be used) for the duration of the COVID-19 declaration under Section 564(b)(1) of the Act, 21 U.S.C. section 360bbb-3(b)(1), unless the authorization is terminated or revoked.     Resp Syncytial Virus by PCR NEGATIVE NEGATIVE Final    Comment: (NOTE) Fact Sheet for Patients: bloggercourse.com  Fact Sheet for Healthcare Providers: seriousbroker.it  This test is not yet approved or cleared by the [place]  FDA and has been authorized for detection and/or diagnosis of SARS-CoV-2 by FDA under an Emergency Use Authorization (EUA). This EUA will remain in effect (meaning this test can be used) for the duration of the COVID-19 declaration under Section 564(b)(1) of the Act, 21 U.S.C. section 360bbb-3(b)(1), unless the authorization is terminated or revoked.  Performed at [facility], [address] [place], [place] [address]      Radiological Exams on Admission: CT CHEST WO CONTRAST Result Date: 11/07/2024 EXAM: CT CHEST WITHOUT CONTRAST 11/07/2024 07:57:35 PM TECHNIQUE: CT of the chest was performed without the administration of intravenous contrast. Multiplanar reformatted images are provided for review. Automated exposure control, iterative reconstruction, and/or weight based adjustment of the mA/kV was utilized to reduce the radiation dose to as low as reasonably achievable. COMPARISON: Portable chest films from 10/31/2024 and 10/26/2024, full body PET CT 02/12/2024, and CTA chest 10/31/2016. CLINICAL HISTORY: Pleural effusion, malignancy suspected. Patient has a history of multiple myeloma. FINDINGS: MEDIASTINUM: Heart is  moderately enlarged. There is no pericardial effusion. There are calcifications in the left main and proximal LAD coronary artery. The cardiac blood pool is hypodense consistent with anemia. The pulmonary trunk is prominent measuring 3.9 cm indicating arterial hypertension (  previously 3.8 cm). There is mild prominence of the central veins. Mild aortic atherosclerosis without aneurysm. The great vessels branched normally. There is lobulated substernal soft tissue thickening in the mediastinum which is most likely related to myelomatous involvement of sternum with multiple lytic sternal lesions noted, some with cortical breakthrough posteriorly. The trachea and main bronchi are clear. Central airways are small in caliber, which could be due to active breathing or bronchospasm. LYMPH NODES: There are shotty mediastinal lymph nodes in the AP window, prevascular and subcarinal spaces up to 10 mm. Hilar adenopathy is not well evaluated without contrast. No axillary adenopathy is seen. LUNGS AND PLEURA: There are moderate-sized left greater than right pleural effusions. Some of the left pleural fluid could be loculated anterolaterally. The pleural effusions collapse and obscure the lower lobes on the left greater than right. There is posterior atelectasis in the upper and right middle lobes along the fissures. Linear atelectasis in the bases. There is multifocal lobular pleural thickening in both hemithoraces probably also due to myelomatous involvement affecting the rib cage, otherwise could be other pleural metastases. No pneumothorax. SOFT TISSUES/BONES: There is a 6 mm hypodense nodule in the left lobe of the thyroid . No follow-up imaging is recommended. Generalized edema of the chest wall. There are extensive lytic lesions present throughout the spine, sternum, ribs, and several in the scapulae. There are multiple fractures in various stages of healing of the bilateral ribs. There is a severe age indeterminate  pathologic compression fracture of the T6 vertebral body and a moderate compression fracture of T4, at T6 with vertebra plana, retropulsion and soft tissue extending into the spinal canal, narrowing the thecal sac to 7 mm AP with likely mild compressive effect on the thoracic cord. . There is a mild upper plate pathologic wedge compression fracture of the L2 vertebral body as well. UPPER ABDOMEN: Limited images of the upper abdomen demonstrate suspected multiple liver masses, not well seen due to beam hardening artifact. There is a nodular surface contour to the liver, which was also not seen previously. . On the last image, there is a vague 2.7 cm hypodense mass in segment 4, and a suspected 1.4 cm periligamentous mass in this segment also in the last image. There are probably other masses obscured by beam hardening. Further evaluation is recommended. IMPRESSION: 1. Moderate-sized left greater than right pleural effusions, with possible partial loculation of the left effusion, and associated multifocal lobular pleural thickening, likely related to myelomatous involvement or other pleural metastases, new since 02/12/2024. 2. Extensive osseous lytic disease with multiple bilateral rib fractures in various stages of healing , and age indeterminate . severe pathologic compression fracture of T6 with vertebra plana, retropulsion, and soft tissue extension into the spinal canal with likely mild thoracic cord compression; additional moderate T4 and mild L2 pathologic compression fractures are present, and thoracic spine MRI is recommended. 3. Suspected multiple liver masses, suboptimally evaluated due to beam hardening artifact, and contrast-enhanced CT or MRI of the abdomen is recommended for further evaluation. 4. The pleural effusions collapse and obscure the lower lobes on the left greater than right. Underlying pneumonia or mass would be difficult to exclude. 5. Cardiomegaly with enlarged pulmonary trunk , prominent  central veins , edema of the body wall. No pulmonary edema is seen. 6. Aortic and coronary artery atherosclerosis. Electronically signed by: Francis Quam MD 11/07/2024 08:40 PM EST RP Workstation: [id number]   DG Chest Port 1 View Result Date: 11/07/2024 CLINICAL DATA:  Shortness of breath  EXAM: PORTABLE CHEST 1 VIEW COMPARISON:  10/31/2024, PET CT 02/12/2024, chest x-ray 09/08/2024 FINDINGS: Cardiomegaly with vascular congestion. Small right and moderate left pleural effusions, increased compared to prior. Worsened airspace disease at both bases. Aortic atherosclerosis. No pneumothorax IMPRESSION: Cardiomegaly with vascular congestion. Small right and moderate left pleural effusions, increased compared to prior. Worsened airspace disease at both bases may be due to atelectasis or pneumonia. Electronically Signed   By: Luke Bun M.D.   On: 11/07/2024 15:54    EKG: Independently reviewed.  Sinus tachycardia.  Assessment/Plan Principal Problem:   Acute respiratory failure (HCC) Active Problems:   Hyperlipidemia   Essential hypertension   History of pulmonary embolism   Iron deficiency anemia   History of hemorrhagic cerebrovascular accident (CVA) without residual deficits   COPD, moderate (HCC)   Hypercalcemia of malignancy   Multiple myeloma not having achieved remission (HCC)   Thrombocytopenia   Acute respiratory failure with hypoxia (HCC)   Pleural effusion    Acute respiratory failure with hypoxia presently on 4 L oxygen likely related to bilateral pleural effusion which could be a combination of both CHF and possible pleural effusions secondary to metastasis.  Patient received 20 mg IV Lasix  and I ordered 3 more doses.  Since there is some concern for possible pneumonia will keep patient on empiric antibiotics.  Given the possibility of pleural effusion loculation which may be secondary to malignancy will consult pulmonologist.  2D echo done on October 30, 2024 showed EF of 60 to  65%. Age-indeterminate T6 compression fraction with mild cord compression and multiple rib fracture.  Will consult neurosurgery.  Will also notify oncology.  Addendum -   discussed with Megan Bergman on-call for neurosurgery team who at this time recommending MRI of the T-spine nonsurgical management and treatment for multiple myeloma.  Neurosurgery team will be following the MRI report. Symptomatic anemia and thrombocytopenia with multiple myeloma recent admission requiring 5 units of PRBC transfusion and also had required platelet transfusion.  Patient's chemotherapy was held and oncology planning to refer patient to [facility] for consideration of BITE therapy.  To transfuse if platelets less than 10,000 or if bleeding then need to keep platelets more than 50,000. Atrial flutter with RVR now in sinus rhythm on amiodarone  and diltiazem .  Not on anticoagulation secondary to severe thrombocytopenia and also history of intracranial bleed. Hypercalcemia of multiple myeloma was treated with Zometa  on 10/21/2024.  On calcitonin. Prior history of PE and DVT.  On aspirin .  No longer on anticoagulation secondary to history of intracranial bleed. Hypertension on Cardizem .  Lasix  added. COPD not actively wheezing continue inhaler.  Since patient has acute respiratory failure with pleural effusions T6 compression fracture with mild cord compression will need more than 2 midnight stay.  DVT prophylaxis: SCDs. Code Status: Full code. Family Communication: Family at the bedside. Disposition Plan: Monitor bed. Consults called: Will consult neurosurgery oncology and pulmonology. Admission status: Inpatient.         [1]  Allergies Allergen Reactions   Latuda  [Lurasidone ] Other (See Comments)    Made leg muscles twitch (went off this herself in December, 2017)   "

## 2024-11-07 NOTE — ED Notes (Signed)
Pt given ginger ale, crackers and cheese.

## 2024-11-07 NOTE — ED Notes (Signed)
 Purewick placed on pt. Brief changed. Mepilex placed on pts sore on bottom.

## 2024-11-07 NOTE — ED Provider Notes (Signed)
 " [place] EMERGENCY DEPARTMENT AT [facility] Provider Note   CSN: [id number] Arrival date & time: 11/07/24  1343     Patient presents with: Shortness of Breath   Carla Hanson is a 59 y.o. female.   Patient is a 59 year old female with a past medical history of multiple myeloma not currently on chemotherapy, prior ICH while on [facility], prior provoked DVT and recent hospitalization for symptomatic anemia presenting to the emergency department with shortness of breath.  Patient states that she started to have shortness of breath yesterday.  She states that she initially thought it was due to her anxiety but states that she thinks she is just anxious from feeling short of breath.  She denies any associated chest pain or cough.  She denies any new bruising or bleeding.  She denies any lower extremity swelling.  She denies fever.  She was found to be hypoxic in the 70s on room air and was placed on 4 L nasal cannula by EMS.  The history is provided by the patient.  Shortness of Breath      Prior to Admission medications  Medication Sig Start Date End Date Taking? Authorizing Provider  acyclovir  (ZOVIRAX ) 400 MG tablet Take 1 tablet (400 mg total) by mouth 2 (two) times daily. 08/06/24  Yes Onesimo Emaline Brink, MD  albuterol  (PROVENTIL ) (2.5 MG/3ML) 0.083% nebulizer solution Take 3 mLs (2.5 mg total) by nebulization every 6 (six) hours as needed for wheezing or shortness of breath. 02/06/24  Yes Anders Otto DASEN, MD  albuterol  (VENTOLIN  HFA) 108 (90 Base) MCG/ACT inhaler Inhale 2 puffs into the lungs every 6 (six) hours as needed for wheezing or shortness of breath. 10/18/24  Yes Anders Otto DASEN, MD  amiodarone  (PACERONE ) 200 MG tablet Take 1 tablet (200 mg total) by mouth 2 (two) times daily. 11/05/24  Yes Jadine Toribio SQUIBB, MD  calcitonin, salmon, (MIACALCIN MARCIANA) 200 UNIT/ACT nasal spray Place 1 spray into alternate nostrils daily. 11/06/24  Yes Jadine Toribio SQUIBB, MD  cetirizine   (ZYRTEC ) 10 MG tablet Take 1 tablet (10 mg total) by mouth daily. Patient taking differently: Take 10 mg by mouth daily as needed for rhinitis or allergies. 08/06/24  Yes Anders Otto DASEN, MD  diltiazem  (CARDIZEM  CD) 240 MG 24 hr capsule Take 1 capsule (240 mg total) by mouth daily. 11/06/24 02/04/25 Yes Jadine Toribio SQUIBB, MD  ferrous sulfate  325 (65 FE) MG tablet Take 325 mg by mouth daily with breakfast.   Yes [provider]  folic acid  (FOLVITE ) 1 MG tablet Take 1 tablet (1 mg total) by mouth 2 (two) times daily. 09/15/24  Yes Onesimo Emaline Brink, MD  potassium chloride  SA (KLOR-CON  M) 20 MEQ tablet Take 1 tablet (20 mEq total) by mouth 2 (two) times daily. Patient taking differently: Take 40 mEq by mouth 2 (two) times daily. 10/18/24 11/21/24 Yes Onesimo Emaline Brink, MD  prochlorperazine  (COMPAZINE ) 10 MG tablet Take 1 tablet (10 mg total) by mouth every 6 (six) hours as needed for nausea or vomiting 07/06/24  Yes Onesimo Emaline Brink, MD  SYMBICORT  160-4.5 MCG/ACT inhaler Inhale into the lungs. 11/05/24  Yes [provider]  SYSTANE ULTRA 0.4-0.3 % SOLN Place 1 drop into both eyes 3 (three) times daily as needed (for dryness).   Yes [provider]  thiamine  (VITAMIN B1) 100 MG tablet Take 1 tablet (100 mg total) by mouth daily. 09/15/24  Yes Thayil, Irene T, PA-C  TYLENOL  500 MG tablet Take 500-1,000 mg  by mouth every 6 (six) hours as needed for mild pain (pain score 1-3) (or headaches).   Yes [provider]  tiZANidine  (ZANAFLEX ) 4 MG tablet Take 1 tablet (4 mg total) by mouth 2 (two) times daily as needed for muscle spasms. Patient not taking: Reported on 11/07/2024 08/06/24   Anders Otto DASEN, MD  rosuvastatin  (CRESTOR ) 20 MG tablet Take 1 tablet (20 mg total) by mouth daily. 05/20/24 10/06/24  Anders Otto DASEN, MD    Allergies: Latuda  [lurasidone ]    Review of Systems  Respiratory:  Positive for shortness of breath.     Updated Vital Signs BP 122/78 (BP  Location: Left Arm)   Pulse 96   Temp 98.3 F (36.8 C) (Oral)   Resp 20   LMP  (LMP Unknown)   SpO2 100%   Physical Exam Vitals and nursing note reviewed.  Constitutional:      General: She is not in acute distress.    Appearance: She is well-developed. She is obese.  HENT:     Head: Normocephalic.     Mouth/Throat:     Mouth: Mucous membranes are moist.     Pharynx: Oropharynx is clear.  Eyes:     Extraocular Movements: Extraocular movements intact.  Cardiovascular:     Rate and Rhythm: Normal rate and regular rhythm.  Pulmonary:     Effort: Pulmonary effort is normal.     Breath sounds: Examination of the right-lower field reveals rales. Examination of the left-lower field reveals rales. Rales present.  Abdominal:     Palpations: Abdomen is soft.  Musculoskeletal:        General: Normal range of motion.     Cervical back: Normal range of motion and neck supple.     Right lower leg: No edema.     Left lower leg: No edema.  Skin:    General: Skin is warm and dry.     Findings: Ecchymosis (Large eccymosis to R forearm, smaller contusions on L forearm; old per patient) present.  Neurological:     General: No focal deficit present.     Mental Status: She is alert and oriented to person, place, and time.  Psychiatric:        Mood and Affect: Mood normal.        Behavior: Behavior normal.     (all labs ordered are listed, but only abnormal results are displayed) Labs Reviewed  CBC WITH DIFFERENTIAL/PLATELET - Abnormal; Notable for the following components:      Result Value   WBC 12.3 (*)    RBC 3.00 (*)    Hemoglobin 8.3 (*)    HCT 26.6 (*)    RDW 16.3 (*)    Platelets 18 (*)    nRBC 8.0 (*)    Monocytes Absolute 2.4 (*)    Abs Immature Granulocytes 1.54 (*)    All other components within normal limits  BASIC METABOLIC PANEL WITH GFR - Abnormal; Notable for the following components:   Potassium 3.3 (*)    Glucose, Bld 110 (*)    Calcium  11.4 (*)    All other  components within normal limits  PRO BRAIN NATRIURETIC PEPTIDE - Abnormal; Notable for the following components:   Pro Brain Natriuretic Peptide 2,001.0 (*)    All other components within normal limits  TROPONIN T, HIGH SENSITIVITY - Abnormal; Notable for the following components:   Troponin T High Sensitivity 49 (*)    All other components within normal limits  RESP PANEL  BY RT-PCR (RSV, FLU A&B, COVID)  RVPGX2  TYPE AND SCREEN  TROPONIN T, HIGH SENSITIVITY    EKG: EKG Interpretation Date/Time:  Sunday November 07 2024 14:07:54 EST Ventricular Rate:  104 PR Interval:  152 QRS Duration:  90 QT Interval:  340 QTC Calculation: 448 R Axis:   121  Text Interpretation: Sinus tachycardia Ventricular premature complex Right axis deviation Borderline repolarization abnormality Appears to be in sinus rhythm now compared to prior EKG Interpretation limited secondary to artifact Confirmed by Ellouise Fine ([id number]) on 11/07/2024 2:45:44 PM  Radiology: [facility] Chest Port 1 View Result Date: 11/07/2024 CLINICAL DATA:  Shortness of breath EXAM: PORTABLE CHEST 1 VIEW COMPARISON:  10/31/2024, PET CT 02/12/2024, chest x-ray 09/08/2024 FINDINGS: Cardiomegaly with vascular congestion. Small right and moderate left pleural effusions, increased compared to prior. Worsened airspace disease at both bases. Aortic atherosclerosis. No pneumothorax IMPRESSION: Cardiomegaly with vascular congestion. Small right and moderate left pleural effusions, increased compared to prior. Worsened airspace disease at both bases may be due to atelectasis or pneumonia. Electronically Signed   By: Luke Bun M.D.   On: 11/07/2024 15:54     Procedures   Medications Ordered in the ED  furosemide  (LASIX ) injection 20 mg (has no administration in time range)  potassium chloride  SA (KLOR-CON  M) CR tablet 40 mEq (has no administration in time range)  cefTRIAXone  (ROCEPHIN ) 1 g in sodium chloride  0.9 % 100 mL IVPB (0 g Intravenous  Stopped 11/07/24 1759)  azithromycin  (ZITHROMAX ) 500 mg in sodium chloride  0.9 % 250 mL IVPB (0 mg Intravenous Stopped 11/07/24 1903)    Clinical Course as of 11/07/24 1910  Sun Nov 07, 2024  1620 CXR with cardiomegaly and pulmonary edema vs possible PNA, echo ~1 week ago without signs of CHF. [VK]  1708 Labs with mild leukocytosis, hgb and platelets at baseline. Will give abx for suspected pneumonia.  [VK]    Clinical Course User Index [VK] Kingsley, Royanne Warshaw K, DO                                 Medical Decision Making This patient presents to the ED with chief complaint(s) of SOB, hypoxia with pertinent past medical history of multiple myeloma, recent admission for symptomatic anemia which further complicates the presenting complaint. The complaint involves an extensive differential diagnosis and also carries with it a high risk of complications and morbidity.    The differential diagnosis includes ACS, arrhythmia, anemia, pneumonia, pneumothorax, pulmonary edema, pleural effusion, considering PE with recent hospitalization, viral syndrome  Additional history obtained: Additional history obtained from N/A Records reviewed previous admission documents  ED Course and Reassessment: On patient's arrival she is hemodynamically stable in no acute distress satting well on 4 L nasal cannula.  The patient had EKG showed normal sinus rhythm without acute ischemic changes.  Show of labs and chest x-ray and viral swab will be closely reassessed.  Independent labs interpretation:  The following labs were independently interpreted: mild leukocytosis, Hgb and platelets at baseline; elevated BNP, mild hypokalemia  Independent visualization of imaging: - I independently visualized the following imaging with scope of interpretation limited to determining acute life threatening conditions related to emergency care: CXR, which revealed cardiomegaly with increased vascular congestions, pleural effusions,  bilateral lower lobe pneumonia  Consultation: - Consulted or discussed management/test interpretation w/ external professional: hospitalist  Consideration for admission or further workup: patient requires admission for hypoxia in the setting  of pneumonia and pleural effusions Social Determinants of health: N/A    Amount and/or Complexity of Data Reviewed Labs: ordered. Radiology: ordered.  Risk Prescription drug management. Decision regarding hospitalization.       Final diagnoses:  Hypoxia  Community acquired pneumonia, unspecified laterality  Pleural effusion    ED Discharge Orders     None          Ellouise Richerd POUR, DO 11/07/24 1910  "

## 2024-11-07 NOTE — ED Triage Notes (Signed)
 Pt arrives via [facility] from home. Recently admitted for extended LOS for anemia r/t multiple myeloma. Pt states she began feeling SOB yesterday. Home health nurse checked on pt and found her to be hypoxic at 74% on RA. Confirmed by EMS. Pt placed on 4 lpm O2 via [place]. Pt denies any overt bleeding. No CP per pt.

## 2024-11-08 ENCOUNTER — Inpatient Hospital Stay (HOSPITAL_COMMUNITY)

## 2024-11-08 ENCOUNTER — Encounter (HOSPITAL_COMMUNITY): Payer: Self-pay | Admitting: Internal Medicine

## 2024-11-08 ENCOUNTER — Encounter (HOSPITAL_COMMUNITY): Payer: Self-pay

## 2024-11-08 DIAGNOSIS — Z87891 Personal history of nicotine dependence: Secondary | ICD-10-CM

## 2024-11-08 DIAGNOSIS — C9000 Multiple myeloma not having achieved remission: Secondary | ICD-10-CM | POA: Diagnosis not present

## 2024-11-08 DIAGNOSIS — J90 Pleural effusion, not elsewhere classified: Secondary | ICD-10-CM

## 2024-11-08 DIAGNOSIS — D649 Anemia, unspecified: Secondary | ICD-10-CM | POA: Diagnosis not present

## 2024-11-08 DIAGNOSIS — R0902 Hypoxemia: Secondary | ICD-10-CM

## 2024-11-08 DIAGNOSIS — D696 Thrombocytopenia, unspecified: Secondary | ICD-10-CM

## 2024-11-08 DIAGNOSIS — J9601 Acute respiratory failure with hypoxia: Secondary | ICD-10-CM | POA: Diagnosis not present

## 2024-11-08 DIAGNOSIS — S22050A Wedge compression fracture of T5-T6 vertebra, initial encounter for closed fracture: Secondary | ICD-10-CM

## 2024-11-08 DIAGNOSIS — I5033 Acute on chronic diastolic (congestive) heart failure: Secondary | ICD-10-CM | POA: Insufficient documentation

## 2024-11-08 LAB — CBC
HCT: 23.4 % — ABNORMAL LOW (ref 36.0–46.0)
Hemoglobin: 7.2 g/dL — ABNORMAL LOW (ref 12.0–15.0)
MCH: 27.5 pg (ref 26.0–34.0)
MCHC: 30.8 g/dL (ref 30.0–36.0)
MCV: 89.3 fL (ref 80.0–100.0)
Platelets: 15 K/uL — CL (ref 150–400)
RBC: 2.62 MIL/uL — ABNORMAL LOW (ref 3.87–5.11)
RDW: 16.1 % — ABNORMAL HIGH (ref 11.5–15.5)
WBC: 9.6 K/uL (ref 4.0–10.5)
nRBC: 6.3 % — ABNORMAL HIGH (ref 0.0–0.2)

## 2024-11-08 LAB — COMPREHENSIVE METABOLIC PANEL WITH GFR
ALT: 8 U/L (ref 0–44)
AST: 48 U/L — ABNORMAL HIGH (ref 15–41)
Albumin: 2.6 g/dL — ABNORMAL LOW (ref 3.5–5.0)
Alkaline Phosphatase: 186 U/L — ABNORMAL HIGH (ref 38–126)
Anion gap: 9 (ref 5–15)
BUN: 12 mg/dL (ref 6–20)
CO2: 28 mmol/L (ref 22–32)
Calcium: 10.8 mg/dL — ABNORMAL HIGH (ref 8.9–10.3)
Chloride: 104 mmol/L (ref 98–111)
Creatinine, Ser: 0.58 mg/dL (ref 0.44–1.00)
GFR, Estimated: >60 mL/min
Glucose, Bld: 97 mg/dL (ref 70–99)
Potassium: 2.9 mmol/L — ABNORMAL LOW (ref 3.5–5.1)
Sodium: 141 mmol/L (ref 135–145)
Total Bilirubin: 0.5 mg/dL (ref 0.0–1.2)
Total Protein: 7 g/dL (ref 6.5–8.1)

## 2024-11-08 LAB — PREPARE RBC (CROSSMATCH)

## 2024-11-08 LAB — MAGNESIUM: Magnesium: 1.5 mg/dL — ABNORMAL LOW (ref 1.7–2.4)

## 2024-11-08 LAB — TSH: TSH: 2.92 u[IU]/mL (ref 0.350–4.500)

## 2024-11-08 MED ORDER — POTASSIUM CHLORIDE CRYS ER 20 MEQ PO TBCR
40.0000 meq | EXTENDED_RELEASE_TABLET | ORAL | Status: AC
  Administered 2024-11-08 (×3): 40 meq via ORAL
  Filled 2024-11-08 (×3): qty 2

## 2024-11-08 MED ORDER — HYDROMORPHONE HCL 1 MG/ML IJ SOLN
1.0000 mg | INTRAMUSCULAR | Status: DC | PRN
  Administered 2024-11-08 – 2024-11-21 (×20): 1 mg via INTRAVENOUS
  Filled 2024-11-08 (×21): qty 1

## 2024-11-08 MED ORDER — SODIUM CHLORIDE 0.9% IV SOLUTION: Freq: Once | INTRAVENOUS | Status: AC

## 2024-11-08 MED ORDER — DEXAMETHASONE 4 MG PO TABS
10.0000 mg | ORAL_TABLET | Freq: Two times a day (BID) | ORAL | Status: DC
  Administered 2024-11-08 – 2024-11-09 (×2): 10 mg via ORAL
  Filled 2024-11-08 (×2): qty 3

## 2024-11-08 MED ORDER — DEXAMETHASONE 4 MG PO TABS
4.0000 mg | ORAL_TABLET | ORAL | Status: AC
  Administered 2024-11-08: 4 mg via ORAL
  Filled 2024-11-08: qty 1

## 2024-11-08 MED ORDER — DOXYCYCLINE HYCLATE 100 MG PO TABS
100.0000 mg | ORAL_TABLET | Freq: Two times a day (BID) | ORAL | Status: DC
  Administered 2024-11-08 – 2024-11-12 (×9): 100 mg via ORAL
  Filled 2024-11-08 (×9): qty 1

## 2024-11-08 MED ORDER — GADOBUTROL 1 MMOL/ML IV SOLN
10.0000 mL | Freq: Once | INTRAVENOUS | Status: AC | PRN
  Administered 2024-11-08: 10 mL via INTRAVENOUS

## 2024-11-08 MED ORDER — LORAZEPAM 2 MG/ML IJ SOLN
0.5000 mg | Freq: Once | INTRAMUSCULAR | Status: AC
  Administered 2024-11-08: 0.5 mg via INTRAVENOUS
  Filled 2024-11-08: qty 1

## 2024-11-08 NOTE — Consult Note (Signed)
 "  NAME:  Carla Hanson, MRN:  [id number], DOB:  05-09-1966, LOS: 1 ADMISSION DATE:  11/07/2024, CONSULTATION DATE:  11/08/24 REFERRING MD:  Carla Hanson , CHIEF COMPLAINT:  SOB  History of Present Illness:  59 yo F PMH multiple myeloma, ICH, DVT,  presented to [facility] ED 11/07/24 w SOB. Associated hypoxia req 4L [place]. Found to have bilat pleural effusions.   PCCM consulted in this setting  Recent admission 10/20/24-11/05/24 for symptomatic anemia req multiple transfusions.    Pertinent  Medical History  Multiple myeloma   Significant Hospital Events: Including procedures, antibiotic start and stop dates in addition to other pertinent events   1/4 admit to [facility] hypoxic resp failure 1/5 PCCM consult for pleural effusion. NSGY consult for bone lesions. Onc consult for dz progression    Interim History / Subjective:   Admitted yesterday   NSGY and oncology saw this morning   Plt 15   Objective    Blood pressure (!) 147/59, pulse 80, temperature 97.8 F (36.6 C), temperature source Oral, resp. rate 20, height 5' 1.5 (1.562 m), weight 92.6 kg, SpO2 96%.        Intake/Output Summary (Last 24 hours) at 11/08/2024 1134 Last data filed at 11/08/2024 1030 Gross per 24 hour  Intake 487 ml  Output 800 ml  Net -313 ml   Filed Weights   11/07/24 2308  Weight: 92.6 kg    Examination: General: chronically ill middle aged F NAD  Psych: flat mood and affect Neuro: AAOx3 HENT: NCAT  Lungs: shallow respirations, symmetrical chest expansion  Cardiovascular: cap refill <  3 sec  Abdomen: soft abdomen  Extremities: no obvious acute joint deformity  GU: defer  Resolved problem list   Assessment and Plan    Acute resp failure w hypoxia Bilat pleural effusions, L>R  -c/f malignant effusions.  -Bnp is also elevated, could be some volume overload component. -doubt infectious P -rec continuing to diurese  -discussed utility of thora w pt -- symptomatically, might help a bit w her SOB, but  greatest yield is diagnostic data. Discussed need for plt transfusions prior to procedure if she were to move forward  -she wants time to think, and wants to d/w her mother. She is pretty apprehensive about the procedure in general but also isn't sure if she could sit in position given her degree of lethargy/weakness. -follow up w pt later today v tomorrow. No urgent indication, taking time to think/talk is fine  -if ultimately she does wish to move forward, will need plt & will plan to send usual pleural studies including cyto.     Multiple myeloma, with mets & progression of dz  Anemia  Thrombocytopenia  -if she consents to thora, will need plt transfusion  -onc is following    Labs   CBC: Recent Labs  Lab 11/02/24 0513 11/03/24 0557 11/05/24 0547 11/07/24 1643 11/08/24 0557  WBC 8.2 6.8 7.8 12.3* 9.6  NEUTROABS  --   --   --  4.9  --   HGB 8.3* 8.4* 8.9* 8.3* 7.2*  HCT 25.8* 26.2* 28.4* 26.6* 23.4*  MCV 87.5 87.9 88.5 88.7 89.3  PLT 18* 16* 19* 18* 15*    Basic Metabolic Panel: Recent Labs  Lab 11/02/24 0513 11/03/24 0557 11/05/24 1036 11/07/24 1643 11/08/24 0557  NA 140 138 136 140 141  K 4.3 3.8 4.8 3.3* 2.9*  CL 105 103 104 102 104  CO2 24 25 22 27 28   GLUCOSE 102* 92 125* 110*  97  BUN 11 12 11 12 12   CREATININE 0.50 0.44 0.46 0.59 0.58  CALCIUM  8.9 9.6 10.1 11.4* 10.8*  MG 1.8 1.8  --   --   --   PHOS 3.5 2.5  --   --   --    GFR: Estimated Creatinine Clearance: 80.3 mL/min (by C-G formula based on SCr of 0.58 mg/dL). Recent Labs  Lab 11/03/24 0557 11/05/24 0547 11/07/24 1643 11/08/24 0557  WBC 6.8 7.8 12.3* 9.6    Liver Function Tests: Recent Labs  Lab 11/02/24 0513 11/03/24 0557 11/08/24 0557  AST 323* 183* 48*  ALT 10 8 8   ALKPHOS 235* 246* 186*  BILITOT 0.7 0.8 0.5  PROT 7.2 7.2 7.0  ALBUMIN 2.8* 2.5* 2.6*   No results for input(s): LIPASE, AMYLASE in the last 168 hours. No results for input(s): AMMONIA in the last 168  hours.  ABG    Component Value Date/Time   TCO2 25 04/17/2016 1453     Coagulation Profile: No results for input(s): INR, PROTIME in the last 168 hours.  Cardiac Enzymes: No results for input(s): CKTOTAL, CKMB, CKMBINDEX, TROPONINI in the last 168 hours.  HbA1C: HbA1c, POC (controlled diabetic range)  Date/Time Value Ref Range Status  04/11/2020 11:10 AM 6.1 0.0 - 7.0 % Final  08/27/2019 09:50 AM 6.0 0.0 - 7.0 % Final   Hgb A1c MFr Bld  Date/Time Value Ref Range Status  08/04/2024 01:04 PM 6.6 (H) 4.8 - 5.6 % Final    Comment:             Prediabetes: 5.7 - 6.4          Diabetes: >6.4          Glycemic control for adults with diabetes: <7.0   06/24/2024 08:46 AM 6.2 (H) 4.8 - 5.6 % Final    Comment:             Prediabetes: 5.7 - 6.4          Diabetes: >6.4          Glycemic control for adults with diabetes: <7.0     CBG: No results for input(s): GLUCAP in the last 168 hours.  Review of Systems:   Review of Systems  Constitutional:  Positive for malaise/fatigue.  Respiratory:  Positive for shortness of breath.   Endo/Heme/Allergies:  Bruises/bleeds easily.     Past Medical History:  She,  has a past medical history of Alcohol  abuse, Anxiety, Back pain, Bipolar disorder (HCC), Bronchitis, COPD exacerbation (HCC) (05/10/2016), Drug use, Edema (02/24/2024), History of blood clots, Hyperlipidemia, Hypertension, Influenza A (12/01/2018), Joint pain, Left leg DVT (HCC) (09/29/2013), Lipoma, LIPOMA (01/20/2008), Obesity, Occasional tremors (05/12/2019), Osteoarthritis of right knee, Other fatigue, Painful menstrual periods (01/05/2012), Pneumonia, Prediabetes, Seizure (HCC), Shortness of breath on exertion, Stroke (HCC), and Tobacco abuse.   Surgical History:   Past Surgical History:  Procedure Laterality Date   CESAREAN SECTION     CYSTECTOMY     IR BONE MARROW BIOPSY & ASPIRATION  10/27/2024   LIPOMA EXCISION  03/2011   ORIF ANKLE FRACTURE  03/13/2012    Procedure: OPEN REDUCTION INTERNAL FIXATION (ORIF) ANKLE FRACTURE;  Surgeon: Lynwood FORBES Better, MD;  Location: [facility];  Service: Orthopedics;  Laterality: Right;   TUBAL LIGATION       Social History:   reports that she quit smoking about 8 years ago. Her smoking use included cigarettes. She started smoking about 43 years ago. She has a 17.5 pack-year  smoking history. She has never used smokeless tobacco. She reports current drug use. Drug: Marijuana. She reports that she does not drink alcohol .   Family History:  Her family history includes Asthma in her father; Diabetes in her maternal aunt; Heart attack in her father; Obesity in her father and mother; Other in her mother. There is no history of Breast cancer.   Allergies Allergies[1]   Home Medications  Prior to Admission medications  Medication Sig Start Date End Date Taking? Authorizing Provider  acyclovir  (ZOVIRAX ) 400 MG tablet Take 1 tablet (400 mg total) by mouth 2 (two) times daily. 08/06/24  Yes Onesimo Emaline Brink, MD  albuterol  (PROVENTIL ) (2.5 MG/3ML) 0.083% nebulizer solution Take 3 mLs (2.5 mg total) by nebulization every 6 (six) hours as needed for wheezing or shortness of breath. 02/06/24  Yes Anders Otto DASEN, MD  albuterol  (VENTOLIN  HFA) 108 (90 Base) MCG/ACT inhaler Inhale 2 puffs into the lungs every 6 (six) hours as needed for wheezing or shortness of breath. 10/18/24  Yes Anders Otto DASEN, MD  amiodarone  (PACERONE ) 200 MG tablet Take 1 tablet (200 mg total) by mouth 2 (two) times daily. 11/05/24  Yes Jadine Toribio SQUIBB, MD  calcitonin, salmon, (MIACALCIN MARCIANA) 200 UNIT/ACT nasal spray Place 1 spray into alternate nostrils daily. 11/06/24  Yes Jadine Toribio SQUIBB, MD  cetirizine  (ZYRTEC ) 10 MG tablet Take 1 tablet (10 mg total) by mouth daily. Patient taking differently: Take 10 mg by mouth daily as needed for rhinitis or allergies. 08/06/24  Yes Anders Otto DASEN, MD  diltiazem  (CARDIZEM  CD) 240 MG 24 hr capsule Take 1  capsule (240 mg total) by mouth daily. 11/06/24 02/04/25 Yes Jadine Toribio SQUIBB, MD  ferrous sulfate  325 (65 FE) MG tablet Take 325 mg by mouth daily with breakfast.   Yes [provider]  folic acid  (FOLVITE ) 1 MG tablet Take 1 tablet (1 mg total) by mouth 2 (two) times daily. 09/15/24  Yes Onesimo Emaline Brink, MD  potassium chloride  SA (KLOR-CON  M) 20 MEQ tablet Take 1 tablet (20 mEq total) by mouth 2 (two) times daily. Patient taking differently: Take 40 mEq by mouth 2 (two) times daily. 10/18/24 11/21/24 Yes Onesimo Emaline Brink, MD  prochlorperazine  (COMPAZINE ) 10 MG tablet Take 1 tablet (10 mg total) by mouth every 6 (six) hours as needed for nausea or vomiting 07/06/24  Yes Onesimo Emaline Brink, MD  SYMBICORT  160-4.5 MCG/ACT inhaler Inhale into the lungs. 11/05/24  Yes [provider]  SYSTANE ULTRA 0.4-0.3 % SOLN Place 1 drop into both eyes 3 (three) times daily as needed (for dryness).   Yes [provider]  thiamine  (VITAMIN B1) 100 MG tablet Take 1 tablet (100 mg total) by mouth daily. 09/15/24  Yes Neomi Lis T, PA-C  TYLENOL  500 MG tablet Take 500-1,000 mg by mouth every 6 (six) hours as needed for mild pain (pain score 1-3) (or headaches).   Yes [provider]  tiZANidine  (ZANAFLEX ) 4 MG tablet Take 1 tablet (4 mg total) by mouth 2 (two) times daily as needed for muscle spasms. Patient not taking: Reported on 11/07/2024 08/06/24   Anders Otto DASEN, MD  rosuvastatin  (CRESTOR ) 20 MG tablet Take 1 tablet (20 mg total) by mouth daily. 05/20/24 10/06/24  Anders Otto DASEN, MD     Critical care time: n/a      Ronnald Gave MSN, AGACNP-BC [facility] Amion for pager  11/08/2024, 1:24 PM      [1]  Allergies Allergen Reactions  Latuda  [Lurasidone ] Other (See Comments)    Made leg muscles twitch (went off this herself in December, 2017)   "

## 2024-11-08 NOTE — Consult Note (Signed)
 Reason for Consult: Multiple spinal fractures/metastasis Referring Physician: Dr. Will Jon LITTIE Carla Hanson is an 59 y.o. female.  HPI: The patient is an healthy 59 year old black female with a history of multiple myeloma.  She was admitted with respiratory difficulties and found to have a malignant pleural effusion multiple bony lesions including spinal lesions.  A neurosurgical consultation was requested.  Presently the patient denies any back pain.  She complains of shortness of breath.  Past Medical History:  Diagnosis Date   Alcohol  abuse    Anxiety    Back pain    Bipolar disorder (HCC)    Bronchitis    COPD exacerbation (HCC) 05/10/2016   Drug use    Edema 02/24/2024   History of blood clots    Hyperlipidemia    Hypertension    Influenza A 12/01/2018   Joint pain    Left leg DVT (HCC) 09/29/2013   Lipoma    Abdomen   LIPOMA 01/20/2008   Obesity    Occasional tremors 05/12/2019   Osteoarthritis of right knee    Other fatigue    Painful menstrual periods 01/05/2012   Pneumonia    Prediabetes    Seizure (HCC)    Shortness of breath on exertion    Stroke (HCC)    Tobacco abuse     Past Surgical History:  Procedure Laterality Date   CESAREAN SECTION     CYSTECTOMY     IR BONE MARROW BIOPSY & ASPIRATION  10/27/2024   LIPOMA EXCISION  03/2011   ORIF ANKLE FRACTURE  03/13/2012   Procedure: OPEN REDUCTION INTERNAL FIXATION (ORIF) ANKLE FRACTURE;  Surgeon: Lynwood FORBES Better, MD;  Location: [facility];  Service: Orthopedics;  Laterality: Right;   TUBAL LIGATION      Family History  Problem Relation Age of Onset   Other Mother        High blood pressure runs in the family   Obesity Mother    Asthma Father    Heart attack Father    Obesity Father    Diabetes Maternal Aunt    Breast cancer Neg Hx     Social History:  reports that she quit smoking about 8 years ago. Her smoking use included cigarettes. She started smoking about 43 years ago. She has a 17.5 pack-year  smoking history. She has never used smokeless tobacco. She reports current drug use. Drug: Marijuana. She reports that she does not drink alcohol .  Allergies: Allergies[1]  Medications: I have reviewed the patient's current medications. Prior to Admission:  Medications Prior to Admission  Medication Sig Dispense Refill Last Dose/Taking   acyclovir  (ZOVIRAX ) 400 MG tablet Take 1 tablet (400 mg total) by mouth 2 (two) times daily. 60 tablet 5 11/07/2024 Morning   albuterol  (PROVENTIL ) (2.5 MG/3ML) 0.083% nebulizer solution Take 3 mLs (2.5 mg total) by nebulization every 6 (six) hours as needed for wheezing or shortness of breath. 75 mL 2 Unknown   albuterol  (VENTOLIN  HFA) 108 (90 Base) MCG/ACT inhaler Inhale 2 puffs into the lungs every 6 (six) hours as needed for wheezing or shortness of breath. 18 g 2 Unknown   amiodarone  (PACERONE ) 200 MG tablet Take 1 tablet (200 mg total) by mouth 2 (two) times daily. 60 tablet 0 11/07/2024 Morning   calcitonin, salmon, (MIACALCIN /FORTICAL) 200 UNIT/ACT nasal spray Place 1 spray into alternate nostrils daily. 11.1 mL 0 11/07/2024 Morning   cetirizine  (ZYRTEC ) 10 MG tablet Take 1 tablet (10 mg total) by mouth daily. (Patient taking differently: Take  10 mg by mouth daily as needed for rhinitis or allergies.) 90 tablet 2 Past Month   diltiazem  (CARDIZEM  CD) 240 MG 24 hr capsule Take 1 capsule (240 mg total) by mouth daily. 90 capsule 0 11/07/2024 Morning   ferrous sulfate  325 (65 FE) MG tablet Take 325 mg by mouth daily with breakfast.   Taking   folic acid  (FOLVITE ) 1 MG tablet Take 1 tablet (1 mg total) by mouth 2 (two) times daily. 60 tablet 1 11/07/2024 Morning   potassium chloride  SA (KLOR-CON  M) 20 MEQ tablet Take 1 tablet (20 mEq total) by mouth 2 (two) times daily. (Patient taking differently: Take 40 mEq by mouth 2 (two) times daily.) 60 tablet 0 11/07/2024 Morning   prochlorperazine  (COMPAZINE ) 10 MG tablet Take 1 tablet (10 mg total) by mouth every 6 (six) hours as  needed for nausea or vomiting 30 tablet 1 Past Week   SYMBICORT  160-4.5 MCG/ACT inhaler Inhale into the lungs.   11/07/2024 Morning   SYSTANE ULTRA 0.4-0.3 % SOLN Place 1 drop into both eyes 3 (three) times daily as needed (for dryness).   Unknown   thiamine  (VITAMIN B1) 100 MG tablet Take 1 tablet (100 mg total) by mouth daily. 30 tablet 3 11/07/2024 Morning   TYLENOL  500 MG tablet Take 500-1,000 mg by mouth every 6 (six) hours as needed for mild pain (pain score 1-3) (or headaches).   Unknown   tiZANidine  (ZANAFLEX ) 4 MG tablet Take 1 tablet (4 mg total) by mouth 2 (two) times daily as needed for muscle spasms. (Patient not taking: Reported on 11/07/2024) 60 tablet 3 Not Taking   Scheduled:  acyclovir   400 mg Oral BID   amiodarone   200 mg Oral BID   calcitonin (salmon)  1 spray Alternating Nares Daily   diltiazem   240 mg Oral Daily   doxycycline   100 mg Oral Q12H   ferrous sulfate   325 mg Oral Q breakfast   fluticasone  furoate-vilanterol  1 puff Inhalation Daily   folic acid   1 mg Oral BID   furosemide   20 mg Intravenous Q12H   LORazepam   0.5 mg Intravenous Once   potassium chloride   40 mEq Oral Q2H   thiamine   100 mg Oral Daily   Continuous:  cefTRIAXone  (ROCEPHIN )  IV     PRN:acetaminophen  **OR** acetaminophen  Anti-infectives (From admission, onward)    Start     Dose/Rate Route Frequency Ordered Stop   11/08/24 1700  cefTRIAXone  (ROCEPHIN ) 2 g in sodium chloride  0.9 % 100 mL IVPB        2 g 200 mL/hr over 30 Minutes Intravenous Every 24 hours 11/07/24 2057     11/08/24 1000  azithromycin  (ZITHROMAX ) 500 mg in sodium chloride  0.9 % 250 mL IVPB  Status:  Discontinued        500 mg 250 mL/hr over 60 Minutes Intravenous Every 24 hours 11/07/24 2057 11/08/24 0901   11/08/24 1000  doxycycline  (VIBRA -TABS) tablet 100 mg        100 mg Oral Every 12 hours 11/08/24 0901     11/07/24 2200  acyclovir  (ZOVIRAX ) tablet 400 mg        400 mg Oral 2 times daily 11/07/24 2056     11/07/24 1715   cefTRIAXone  (ROCEPHIN ) 1 g in sodium chloride  0.9 % 100 mL IVPB        1 g 200 mL/hr over 30 Minutes Intravenous  Once 11/07/24 1709 11/07/24 1759   11/07/24 1715  azithromycin  (ZITHROMAX ) 500 mg in  sodium chloride  0.9 % 250 mL IVPB        500 mg 250 mL/hr over 60 Minutes Intravenous  Once 11/07/24 1709 11/07/24 1903        Results for orders placed or performed during the hospital encounter of 11/07/24 (from the past 48 hours)  Type and screen [facility]     Status: None   Collection Time: 11/07/24  4:32 PM  Result Value Ref Range   ABO/RH(D) A POS    Antibody Screen NEG    Sample Expiration      11/10/2024,2359 Performed at [facility], [address] [place], [place] [address]   CBC with Differential     Status: Abnormal   Collection Time: 11/07/24  4:43 PM  Result Value Ref Range   WBC 12.3 (H) 4.0 - 10.5 K/uL    Comment: REPEATED TO VERIFY WHITE COUNT CONFIRMED ON SMEAR    RBC 3.00 (L) 3.87 - 5.11 MIL/uL   Hemoglobin 8.3 (L) 12.0 - 15.0 g/dL   HCT 73.3 (L) 63.9 - 53.9 %   MCV 88.7 80.0 - 100.0 fL   MCH 27.7 26.0 - 34.0 pg   MCHC 31.2 30.0 - 36.0 g/dL   RDW 83.6 (H) 88.4 - 84.4 %   Platelets 18 (LL) 150 - 400 K/uL    Comment: SPECIMEN CHECKED FOR CLOTS PLATELET COUNT CONFIRMED BY SMEAR REPEATED TO VERIFY Immature Platelet Fraction may be clinically indicated, consider ordering this additional test [id number]    nRBC 8.0 (H) 0.0 - 0.2 %   Neutrophils Relative % 39 %   Neutro Abs 4.9 1.7 - 7.7 K/uL   Lymphocytes Relative 26 %   Lymphs Abs 3.2 0.7 - 4.0 K/uL   Monocytes Relative 19 %   Monocytes Absolute 2.4 (H) 0.1 - 1.0 K/uL   Eosinophils Relative 2 %   Eosinophils Absolute 0.2 0.0 - 0.5 K/uL   Basophils Relative 1 %   Basophils Absolute 0.1 0.0 - 0.1 K/uL   WBC Morphology See Note     Comment: Moderate Left Shift (>5% metas and myelos) SMUDGE CELLS    Smear Review Normal platelet morphology     Comment: PLATELETS  APPEAR DECREASED   Immature Granulocytes 13 %   Abs Immature Granulocytes 1.54 (H) 0.00 - 0.07 K/uL   Polychromasia PRESENT     Comment: Performed at [facility], [address], [place], [place] [address]  Basic metabolic panel     Status: Abnormal   Collection Time: 11/07/24  4:43 PM  Result Value Ref Range   Sodium 140 135 - 145 mmol/L   Potassium 3.3 (L) 3.5 - 5.1 mmol/L   Chloride 102 98 - 111 mmol/L   CO2 27 22 - 32 mmol/L   Glucose, Bld 110 (H) 70 - 99 mg/dL    Comment: Glucose reference range applies only to samples taken after fasting for at least 8 hours.   BUN 12 6 - 20 mg/dL   Creatinine, Ser 9.40 0.44 - 1.00 mg/dL   Calcium  11.4 (H) 8.9 - 10.3 mg/dL   GFR, Estimated >39 >39 mL/min    Comment: (NOTE) Calculated using the CKD-EPI Creatinine Equation (2021)    Anion gap 11 5 - 15    Comment: Performed at [facility], [address], [place], [place] [address]  Troponin T, High Sensitivity     Status: Abnormal   Collection Time: 11/07/24  4:43 PM  Result Value Ref  Range   Troponin T High Sensitivity 49 (H) 0 - 19 ng/L    Comment: (NOTE) Biotin concentrations > 1000 ng/mL falsely decrease TnT results.  Serial cardiac troponin measurements are suggested.  Refer to the Links section for chest pain algorithms and additional  guidance. Performed at [facility], [address] [place], [place] [address]   Pro Brain natriuretic peptide     Status: Abnormal   Collection Time: 11/07/24  4:43 PM  Result Value Ref Range   Pro Brain Natriuretic Peptide 2,001.0 (H) <300.0 pg/mL    Comment: (NOTE) Age Group        Cut-Points    Interpretation  < 50 years     450 pg/mL       NT-proBNP > 450 pg/mL indicates                                ADHF is likely              50 to 75 years  900 pg/mL      NT-proBNP > 900 pg/mL indicates          ADHF is likely  > 75 years      1800 pg/mL     NT-proBNP > 1800 pg/mL indicates           ADHF is likely                           All ages    Results between       Indeterminate. Further clinical             300 and the cut-   information is needed to determine            point for age group   if ADHF is present.                                                             Elecsys proBNP II/ Elecsys proBNP II STAT           Cut-Point                       Interpretation  300 pg/mL                    NT-proBNP <300pg/mL indicates                             ADHF is not likely  Performed at [facility], [address], [place], [place] [address]   Resp panel by RT-PCR (RSV, Flu A&B, Covid) Anterior Nasal Swab     Status: None   Collection Time: 11/07/24  5:00 PM   Specimen: Anterior Nasal Swab  Result Value Ref Range   SARS Coronavirus 2 by RT PCR NEGATIVE NEGATIVE    Comment: (NOTE) SARS-CoV-2 target nucleic acids are NOT DETECTED.  The SARS-CoV-2 RNA is generally detectable in upper respiratory specimens during the acute phase of infection. The lowest concentration of SARS-CoV-2 viral copies this assay can detect is 138 copies/mL. A negative result does not preclude  SARS-Cov-2 infection and should not be used as the sole basis for treatment or other patient management decisions. A negative result may occur with  improper specimen collection/handling, submission of specimen other than nasopharyngeal swab, presence of viral mutation(s) within the areas targeted by this assay, and inadequate number of viral copies(<138 copies/mL). A negative result must be combined with clinical observations, patient history, and epidemiological information. The expected result is Negative.  Fact Sheet for Patients:  bloggercourse.com  Fact Sheet for Healthcare Providers:  seriousbroker.it  This test is no t yet approved or cleared by the [place]  FDA and  has been authorized for detection and/or  diagnosis of SARS-CoV-2 by FDA under an Emergency Use Authorization (EUA). This EUA will remain  in effect (meaning this test can be used) for the duration of the COVID-19 declaration under Section 564(b)(1) of the Act, 21 U.S.C.section 360bbb-3(b)(1), unless the authorization is terminated  or revoked sooner.       Influenza A by PCR NEGATIVE NEGATIVE   Influenza B by PCR NEGATIVE NEGATIVE    Comment: (NOTE) The Xpert Xpress SARS-CoV-2/FLU/RSV plus assay is intended as an aid in the diagnosis of influenza from Nasopharyngeal swab specimens and should not be used as a sole basis for treatment. Nasal washings and aspirates are unacceptable for Xpert Xpress SARS-CoV-2/FLU/RSV testing.  Fact Sheet for Patients: bloggercourse.com  Fact Sheet for Healthcare Providers: seriousbroker.it  This test is not yet approved or cleared by the [place]  FDA and has been authorized for detection and/or diagnosis of SARS-CoV-2 by FDA under an Emergency Use Authorization (EUA). This EUA will remain in effect (meaning this test can be used) for the duration of the COVID-19 declaration under Section 564(b)(1) of the Act, 21 U.S.C. section 360bbb-3(b)(1), unless the authorization is terminated or revoked.     Resp Syncytial Virus by PCR NEGATIVE NEGATIVE    Comment: (NOTE) Fact Sheet for Patients: bloggercourse.com  Fact Sheet for Healthcare Providers: seriousbroker.it  This test is not yet approved or cleared by the [place]  FDA and has been authorized for detection and/or diagnosis of SARS-CoV-2 by FDA under an Emergency Use Authorization (EUA). This EUA will remain in effect (meaning this test can be used) for the duration of the COVID-19 declaration under Section 564(b)(1) of the Act, 21 U.S.C. section 360bbb-3(b)(1), unless the authorization is terminated or revoked.  Performed  at [facility], [address], [place], [place] [address]   Troponin T, High Sensitivity     Status: Abnormal   Collection Time: 11/07/24  7:29 PM  Result Value Ref Range   Troponin T High Sensitivity 49 (H) 0 - 19 ng/L    Comment: (NOTE) Biotin concentrations > 1000 ng/mL falsely decrease TnT results.  Serial cardiac troponin measurements are suggested.  Refer to the Links section for chest pain algorithms and additional  guidance. Performed at [facility], [address], [place], [place] [address]   TSH     Status: None   Collection Time: 11/08/24  5:57 AM  Result Value Ref Range   TSH 2.920 0.350 - 4.500 uIU/mL    Comment: Performed at [facility], [address], [place], [place] [address]  Comprehensive metabolic panel     Status: Abnormal   Collection Time: 11/08/24  5:57 AM  Result Value Ref Range   Sodium 141 135 - 145 mmol/L   Potassium 2.9 (L) 3.5 - 5.1 mmol/L   Chloride 104 98 - 111 mmol/L  CO2 28 22 - 32 mmol/L   Glucose, Bld 97 70 - 99 mg/dL    Comment: Glucose reference range applies only to samples taken after fasting for at least 8 hours.   BUN 12 6 - 20 mg/dL   Creatinine, Ser 9.41 0.44 - 1.00 mg/dL   Calcium  10.8 (H) 8.9 - 10.3 mg/dL   Total Protein 7.0 6.5 - 8.1 g/dL   Albumin 2.6 (L) 3.5 - 5.0 g/dL   AST 48 (H) 15 - 41 U/L   ALT 8 0 - 44 U/L   Alkaline Phosphatase 186 (H) 38 - 126 U/L   Total Bilirubin 0.5 0.0 - 1.2 mg/dL   GFR, Estimated >39 >39 mL/min    Comment: (NOTE) Calculated using the CKD-EPI Creatinine Equation (2021)    Anion gap 9 5 - 15    Comment: Performed at [facility] [address] [place], [place] [address]  CBC     Status: Abnormal   Collection Time: 11/08/24  5:57 AM  Result Value Ref Range   WBC 9.6 4.0 - 10.5 K/uL    Comment: WHITE COUNT CONFIRMED ON SMEAR   RBC 2.62 (L) 3.87 - 5.11 MIL/uL   Hemoglobin 7.2 (L) 12.0 - 15.0 g/dL   HCT 76.5  (L) 63.9 - 46.0 %   MCV 89.3 80.0 - 100.0 fL   MCH 27.5 26.0 - 34.0 pg   MCHC 30.8 30.0 - 36.0 g/dL   RDW 83.8 (H) 88.4 - 84.4 %   Platelets 15 (LL) 150 - 400 K/uL    Comment: CRITICAL VALUE NOTED.  VALUE IS CONSISTENT WITH PREVIOUSLY REPORTED AND CALLED VALUE. SPECIMEN CHECKED FOR CLOTS Immature Platelet Fraction may be clinically indicated, consider ordering this additional test [id number]    nRBC 6.3 (H) 0.0 - 0.2 %    Comment: Performed at [facility] [address] [place], [place] [address]   *Note: Due to a large number of results and/or encounters for the requested time period, some results have not been displayed. A complete set of results can be found in Results Review.    CT CHEST WO CONTRAST Result Date: 11/07/2024 EXAM: CT CHEST WITHOUT CONTRAST 11/07/2024 07:57:35 PM TECHNIQUE: CT of the chest was performed without the administration of intravenous contrast. Multiplanar reformatted images are provided for review. Automated exposure control, iterative reconstruction, and/or weight based adjustment of the mA/kV was utilized to reduce the radiation dose to as low as reasonably achievable. COMPARISON: Portable chest films from 10/31/2024 and 10/26/2024, full body PET CT 02/12/2024, and CTA chest 10/31/2016. CLINICAL HISTORY: Pleural effusion, malignancy suspected. Patient has a history of multiple myeloma. FINDINGS: MEDIASTINUM: Heart is moderately enlarged. There is no pericardial effusion. There are calcifications in the left main and proximal LAD coronary artery. The cardiac blood pool is hypodense consistent with anemia. The pulmonary trunk is prominent measuring 3.9 cm indicating arterial hypertension (previously 3.8 cm). There is mild prominence of the central veins. Mild aortic atherosclerosis without aneurysm. The great vessels branched normally. There is lobulated substernal soft tissue thickening in the mediastinum which is most likely related to myelomatous  involvement of sternum with multiple lytic sternal lesions noted, some with cortical breakthrough posteriorly. The trachea and main bronchi are clear. Central airways are small in caliber, which could be due to active breathing or bronchospasm. LYMPH NODES: There are shotty mediastinal lymph nodes in the AP window, prevascular and subcarinal spaces up to 10 mm. Hilar adenopathy is not well evaluated without  contrast. No axillary adenopathy is seen. LUNGS AND PLEURA: There are moderate-sized left greater than right pleural effusions. Some of the left pleural fluid could be loculated anterolaterally. The pleural effusions collapse and obscure the lower lobes on the left greater than right. There is posterior atelectasis in the upper and right middle lobes along the fissures. Linear atelectasis in the bases. There is multifocal lobular pleural thickening in both hemithoraces probably also due to myelomatous involvement affecting the rib cage, otherwise could be other pleural metastases. No pneumothorax. SOFT TISSUES/BONES: There is a 6 mm hypodense nodule in the left lobe of the thyroid . No follow-up imaging is recommended. Generalized edema of the chest wall. There are extensive lytic lesions present throughout the spine, sternum, ribs, and several in the scapulae. There are multiple fractures in various stages of healing of the bilateral ribs. There is a severe age indeterminate pathologic compression fracture of the T6 vertebral body and a moderate compression fracture of T4, at T6 with vertebra plana, retropulsion and soft tissue extending into the spinal canal, narrowing the thecal sac to 7 mm AP with likely mild compressive effect on the thoracic cord. . There is a mild upper plate pathologic wedge compression fracture of the L2 vertebral body as well. UPPER ABDOMEN: Limited images of the upper abdomen demonstrate suspected multiple liver masses, not well seen due to beam hardening artifact. There is a nodular  surface contour to the liver, which was also not seen previously. . On the last image, there is a vague 2.7 cm hypodense mass in segment 4, and a suspected 1.4 cm periligamentous mass in this segment also in the last image. There are probably other masses obscured by beam hardening. Further evaluation is recommended. IMPRESSION: 1. Moderate-sized left greater than right pleural effusions, with possible partial loculation of the left effusion, and associated multifocal lobular pleural thickening, likely related to myelomatous involvement or other pleural metastases, new since 02/12/2024. 2. Extensive osseous lytic disease with multiple bilateral rib fractures in various stages of healing , and age indeterminate . severe pathologic compression fracture of T6 with vertebra plana, retropulsion, and soft tissue extension into the spinal canal with likely mild thoracic cord compression; additional moderate T4 and mild L2 pathologic compression fractures are present, and thoracic spine MRI is recommended. 3. Suspected multiple liver masses, suboptimally evaluated due to beam hardening artifact, and contrast-enhanced CT or MRI of the abdomen is recommended for further evaluation. 4. The pleural effusions collapse and obscure the lower lobes on the left greater than right. Underlying pneumonia or mass would be difficult to exclude. 5. Cardiomegaly with enlarged pulmonary trunk , prominent central veins , edema of the body wall. No pulmonary edema is seen. 6. Aortic and coronary artery atherosclerosis. Electronically signed by: Francis Quam MD 11/07/2024 08:40 PM EST RP Workstation: [id number]   DG Chest Port 1 View Result Date: 11/07/2024 CLINICAL DATA:  Shortness of breath EXAM: PORTABLE CHEST 1 VIEW COMPARISON:  10/31/2024, PET CT 02/12/2024, chest x-ray 09/08/2024 FINDINGS: Cardiomegaly with vascular congestion. Small right and moderate left pleural effusions, increased compared to prior. Worsened airspace disease at  both bases. Aortic atherosclerosis. No pneumothorax IMPRESSION: Cardiomegaly with vascular congestion. Small right and moderate left pleural effusions, increased compared to prior. Worsened airspace disease at both bases may be due to atelectasis or pneumonia. Electronically Signed   By: Luke Bun M.D.   On: 11/07/2024 15:54    ROS: As above Blood pressure (!) 147/59, pulse 80, temperature 97.8 F (36.6 C), temperature  source Oral, resp. rate 20, height 5' 1.5 (1.562 m), weight 92.6 kg, SpO2 96%. Estimated body mass index is 37.95 kg/m as calculated from the following:   Height as of this encounter: 5' 1.5 (1.562 m).   Weight as of this encounter: 92.6 kg.  Physical Exam  General: An unhealthy hearing order breath 59 year old female with ecchymosis.  HEENT: Normocephalic, extraocular muscles are intact  Thorax: Symmetric  Abdomen: Obese  Extremities: Unremarkable  Neurologic exam: The patient is alert and oriented x 3.  Cranial nerves II through XII were examined bilaterally grossly normal.  The patient's motor strength is grossly normal in her bilateral bicep, handgrip, gastrocnemius and dorsiflexors.  Sensory function is intact to light touch sensation in all tested dermatomes bilaterally.  Cerebellar functions intact to rapid alternating movements of the upper extremities bilaterally.  I reviewed the patient's chest CT performed yesterday.  She has multiple bony lesions with prominent at T6.  There is mild stenosis.  Assessment/Plan: Multiple myeloma, pathologic spinal fractures: I have discussed the situation with the patient.  Her fractures are incidental and not symptomatic.  I do not think I have much to offer her.  Please call if I can be of further assistance.  Carla Hanson 11/08/2024, 11:01 AM        [1]  Allergies Allergen Reactions   Latuda  [Lurasidone ] Other (See Comments)    Made leg muscles twitch (went off this herself in December, 2017)

## 2024-11-08 NOTE — Plan of Care (Signed)
" °  Problem: Education: Goal: Knowledge of General Education information will improve Description: Including pain rating scale, medication(s)/side effects and non-pharmacologic comfort measures Outcome: Progressing   Problem: Clinical Measurements: Goal: Will remain free from infection Outcome: Progressing Goal: Respiratory complications will improve Outcome: Progressing Goal: Cardiovascular complication will be avoided Outcome: Progressing   Problem: Nutrition: Goal: Adequate nutrition will be maintained Outcome: Progressing   Problem: Elimination: Goal: Will not experience complications related to urinary retention Outcome: Progressing   Problem: Pain Managment: Goal: General experience of comfort will improve and/or be controlled Outcome: Progressing   "

## 2024-11-08 NOTE — Progress Notes (Signed)
 " PROGRESS NOTE    Carla Hanson  FMW:[id number] DOB: 02/23/1966 DOA: 11/07/2024 PCP: Anders Otto DASEN, MD  Brief Narrative: 59 y.o. female with history of multiple myeloma, intracranial hemorrhage while being on Xarelto , recently admitted from 10/20/2024 through 11/05/2024 for symptomatic anemia had required at least 5 units of PRBC transfusion also has severe thrombocytopenia during the stay patient also had new onset A-fib difficult to control was placed on amiodarone  and Cardizem  not on anticoagulation due to history of severe thrombocytopenia and intracranial bleed previously discharged about 2 days ago presents with increasing shortness of breath since yesterday.  Patient states that at baseline she is nonambulatory.  Was getting short of breath more on lying down with no associated productive cough fever chills.  Denies any chest pain.  At home patient's home health aide found that patient was desatting to 74% and had to be placed on oxygen and was referred to ER.   ED Course: In the ER patient was hypoxic requiring 4 L oxygen.  Patient was afebrile.  Chest x-ray showed bilateral pleural effusion more on the left side.  CT chest shows moderate-sized left greater than right pleural effusion with possible partial loculation of the left effusion.  Associated multifocal lobular pleural thickening likely related to myelomatous involvement of the pleural metastasis new since April 2025.  Also has age indeterminant T6 compression fracture with mild cord compression.  Multiple rib fracture age.  proBNP was 2000 troponins were flat at 49.  WBC 12.3 hemoglobin 8.3 platelets 18.  Patient was started on Lasix  antibiotics admitted for further workup.    Assessment & Plan:   Principal Problem:   Acute respiratory failure (HCC) Active Problems:   Hyperlipidemia   Essential hypertension   History of pulmonary embolism   Iron deficiency anemia   History of hemorrhagic cerebrovascular accident (CVA) without  residual deficits   COPD, moderate (HCC)   Hypercalcemia of malignancy   Multiple myeloma not having achieved remission (HCC)   Thrombocytopenia   Acute respiratory failure with hypoxia (HCC)   Pleural effusion   Compression fracture of T6 vertebra (HCC)   Acute on chronic heart failure with preserved ejection fraction (HFpEF) (HCC)   #1 acute hypoxic respiratory failure multifactorial, probably related to multiple myeloma versus severe hypoalbuminemia versus diastolic heart failure versus pneumonia.  She was not taking her diuretics at home due to soft BP.  She was discharged 2 days ago from the hospital.  She has not walked since she reached home for the last day or 2.  Her BNP was found to be elevated.  She was started on diuresis.  Continue diuresis as long as she can tolerate monitor blood pressure.  Unfortunately due to very low platelets risk of bleeding is high with thoracentesis.  Patient wants to think about thoracentesis and talk to the family and decide whether she wants to have it done or not. Patient was given Lasix , was started on Rocephin  azithromycin  but changed to Rocephin  and Doxy due to concern for prolonged QT.  #2 multiple compression fractures of the thoracic spine mild cord compression.  She really do not have any complaints of back pain, no bowel or bladder complaints, no complaints with sensation she is able to move her lower extremities.  At baseline she does not walk a lot at home but she is able to walk.  MRI thoracic spine - Extensive enhancing epidural and paraspinous disease with diffuse abnormal/enhancing thoracic marrow signal resulting in multilevel spinal canal stenosis; at  T6 there is posterior vertebral body bowing abutting the ventral cord without definite intramedullary cord lesion. Overall appearance is consistent with multiple myeloma. Recommend urgent neurosurgical and/or radiation oncology consultation to assess for impending cord  compression. Pathological fractures of T3 and T4, vertebral planar deformity of T6, and downward deformity of the superior endplate of T12.Mild bilateral pleural effusions. Decadron  10 mg every 12 will inform neurosurgery of MRI findings. Discussed with radiation oncology.  Dr. Mikal to see patient.  #3 severe hypokalemia potassium 2.9 replete check mag level.  #4 symptomatic anemia with severe thrombocytopenia with no evidence of active acute bleed.  Will transfuse 1 unit of packed RBC. Transfuse platelets for less than 10,000 or if signs of bleeding.  #5 atrial flutter on amiodarone  and diltiazem  continue not on anticoagulation due to severe thrombocytopenia and history of intracranial bleed  #6 history of DVT and PVD only on aspirin  due to above  #7 hypercalcemia secondary to multiple myeloma gets monthly Zometa  on calcitonin.  #8 history of hypertension on Cardizem  and Lasix  continue  #9 stage II left buttock pressure injury present on admission   Wound 11/07/24 2300 Pressure Injury Buttocks Left Stage 2 -  Partial thickness loss of dermis presenting as a shallow open injury with a red, pink wound bed without slough. (Active)    Estimated body mass index is 37.95 kg/m as calculated from the following:   Height as of this encounter: 5' 1.5 (1.562 m).   Weight as of this encounter: 92.6 kg.  DVT prophylaxis: None due to severe thrombocytopenia Code Status: Full code  family Communication: None at bedside Disposition Plan:  Status is: Inpatient   Consultants: Oncology, neurosurgery, pulmonary  Procedures: MRI and CT scan of the chest MRI of the thoracic spine Antimicrobials:-Doxycycline  Subjective: Patient is resting in bed very weak chronically ill-appearing wanted to think about thoracentesis wants to talk to her mom  Objective: Vitals:   11/08/24 0333 11/08/24 0739 11/08/24 0815 11/08/24 1438  BP: 129/60 (!) 147/59  127/66  Pulse: 81 80  80  Resp: 18 20  20    Temp: 98.8 F (37.1 C) 97.8 F (36.6 C)  98.2 F (36.8 C)  TempSrc: Oral Oral  Oral  SpO2: 100% 96% 96% 100%  Weight:      Height:        Intake/Output Summary (Last 24 hours) at 11/08/2024 1720 Last data filed at 11/08/2024 1500 Gross per 24 hour  Intake 487 ml  Output 1250 ml  Net -763 ml   Filed Weights   11/07/24 2308  Weight: 92.6 kg    Examination:  General exam: Appears weak and chronically ill-appearing Respiratory system: Diminished breath sounds at the bases respiratory effort normal. Cardiovascular system: reg Gastrointestinal system: Abdomen is nondistended, soft and nontender. No organomegaly or masses felt. Normal bowel sounds heard. Central nervous system: Alert and oriented.  Sensation in the lower extremities intact able to move legs up and down moves all extremities. Extremities: No edema    Data Reviewed: I have personally reviewed following labs and imaging studies  CBC: Recent Labs  Lab 11/02/24 0513 11/03/24 0557 11/05/24 0547 11/07/24 1643 11/08/24 0557  WBC 8.2 6.8 7.8 12.3* 9.6  NEUTROABS  --   --   --  4.9  --   HGB 8.3* 8.4* 8.9* 8.3* 7.2*  HCT 25.8* 26.2* 28.4* 26.6* 23.4*  MCV 87.5 87.9 88.5 88.7 89.3  PLT 18* 16* 19* 18* 15*   Basic Metabolic Panel: Recent Labs  Lab  11/02/24 0513 11/03/24 0557 11/05/24 1036 11/07/24 1643 11/08/24 0557  NA 140 138 136 140 141  K 4.3 3.8 4.8 3.3* 2.9*  CL 105 103 104 102 104  CO2 24 25 22 27 28   GLUCOSE 102* 92 125* 110* 97  BUN 11 12 11 12 12   CREATININE 0.50 0.44 0.46 0.59 0.58  CALCIUM  8.9 9.6 10.1 11.4* 10.8*  MG 1.8 1.8  --   --   --   PHOS 3.5 2.5  --   --   --    GFR: Estimated Creatinine Clearance: 80.3 mL/min (by C-G formula based on SCr of 0.58 mg/dL). Liver Function Tests: Recent Labs  Lab 11/02/24 0513 11/03/24 0557 11/08/24 0557  AST 323* 183* 48*  ALT 10 8 8   ALKPHOS 235* 246* 186*  BILITOT 0.7 0.8 0.5  PROT 7.2 7.2 7.0  ALBUMIN 2.8* 2.5* 2.6*   No results for  input(s): LIPASE, AMYLASE in the last 168 hours. No results for input(s): AMMONIA in the last 168 hours. Coagulation Profile: No results for input(s): INR, PROTIME in the last 168 hours. Cardiac Enzymes: No results for input(s): CKTOTAL, CKMB, CKMBINDEX, TROPONINI in the last 168 hours. BNP (last 3 results) Recent Labs    11/07/24 1643  PROBNP 2,001.0*   HbA1C: No results for input(s): HGBA1C in the last 72 hours. CBG: No results for input(s): GLUCAP in the last 168 hours. Lipid Profile: No results for input(s): CHOL, HDL, LDLCALC, TRIG, CHOLHDL, LDLDIRECT in the last 72 hours. Thyroid  Function Tests: Recent Labs    11/08/24 0557  TSH 2.920   Anemia Panel: No results for input(s): VITAMINB12, FOLATE, FERRITIN, TIBC, IRON, RETICCTPCT in the last 72 hours. Sepsis Labs: No results for input(s): PROCALCITON, LATICACIDVEN in the last 168 hours.  Recent Results (from the past 240 hours)  Resp panel by RT-PCR (RSV, Flu A&B, Covid) Anterior Nasal Swab     Status: None   Collection Time: 11/07/24  5:00 PM   Specimen: Anterior Nasal Swab  Result Value Ref Range Status   SARS Coronavirus 2 by RT PCR NEGATIVE NEGATIVE Final    Comment: (NOTE) SARS-CoV-2 target nucleic acids are NOT DETECTED.  The SARS-CoV-2 RNA is generally detectable in upper respiratory specimens during the acute phase of infection. The lowest concentration of SARS-CoV-2 viral copies this assay can detect is 138 copies/mL. A negative result does not preclude SARS-Cov-2 infection and should not be used as the sole basis for treatment or other patient management decisions. A negative result may occur with  improper specimen collection/handling, submission of specimen other than nasopharyngeal swab, presence of viral mutation(s) within the areas targeted by this assay, and inadequate number of viral copies(<138 copies/mL). A negative result must be combined  with clinical observations, patient history, and epidemiological information. The expected result is Negative.  Fact Sheet for Patients:  bloggercourse.com  Fact Sheet for Healthcare Providers:  seriousbroker.it  This test is no t yet approved or cleared by the [place]  FDA and  has been authorized for detection and/or diagnosis of SARS-CoV-2 by FDA under an Emergency Use Authorization (EUA). This EUA will remain  in effect (meaning this test can be used) for the duration of the COVID-19 declaration under Section 564(b)(1) of the Act, 21 U.S.C.section 360bbb-3(b)(1), unless the authorization is terminated  or revoked sooner.       Influenza A by PCR NEGATIVE NEGATIVE Final   Influenza B by PCR NEGATIVE NEGATIVE Final    Comment: (NOTE) The Xpert Xpress  SARS-CoV-2/FLU/RSV plus assay is intended as an aid in the diagnosis of influenza from Nasopharyngeal swab specimens and should not be used as a sole basis for treatment. Nasal washings and aspirates are unacceptable for Xpert Xpress SARS-CoV-2/FLU/RSV testing.  Fact Sheet for Patients: bloggercourse.com  Fact Sheet for Healthcare Providers: seriousbroker.it  This test is not yet approved or cleared by the [place]  FDA and has been authorized for detection and/or diagnosis of SARS-CoV-2 by FDA under an Emergency Use Authorization (EUA). This EUA will remain in effect (meaning this test can be used) for the duration of the COVID-19 declaration under Section 564(b)(1) of the Act, 21 U.S.C. section 360bbb-3(b)(1), unless the authorization is terminated or revoked.     Resp Syncytial Virus by PCR NEGATIVE NEGATIVE Final    Comment: (NOTE) Fact Sheet for Patients: bloggercourse.com  Fact Sheet for Healthcare Providers: seriousbroker.it  This test is not yet approved  or cleared by the [place]  FDA and has been authorized for detection and/or diagnosis of SARS-CoV-2 by FDA under an Emergency Use Authorization (EUA). This EUA will remain in effect (meaning this test can be used) for the duration of the COVID-19 declaration under Section 564(b)(1) of the Act, 21 U.S.C. section 360bbb-3(b)(1), unless the authorization is terminated or revoked.  Performed at [facility], [address] [place], [place] [address]          Radiology Studies: MR THORACIC SPINE W WO CONTRAST Result Date: 11/08/2024 EXAM: MRI THORACIC SPINE WITH AND WITHOUT INTRAVENOUS CONTRAST 11/08/2024 12:08:51 PM TECHNIQUE: Multiplanar multisequence MRI of the thoracic spine was performed with and without the administration of 10 mL gadobutrol  (GADAVIST ) 1 MMOL/ML injection. COMPARISON: CT of the chest dated 11/07/2024. CLINICAL HISTORY: Spine metastases, thoracic, monitor. FINDINGS: BONES AND ALIGNMENT: Normal alignment. The thoracic vertebrae are all diffusely reduced in T1 signal intensity and/or heterogeneous in signal intensity on T2. There is a vertebral planar deformity of T6 again demonstrated. There are pathological fractures of T3 and T4. There is a downward bone deformity of the superior endplate of T12. The vertebral bodies also demonstrate heterogeneous contrast enhancement. The findings are highly suggestive of widespread metastatic disease involving the thoracic spine, epidural space, and paraspinous regions, with associated pathological fractures, vertebral deformities, and spinal canal stenosis. Follow-up imaging, potentially with a dedicated whole-body bone scan or PET/CT, may be considered for further assessment of disease extent. Clinical correlation with known primary malignancy is essential. SPINAL CORD: Normal spinal cord volume. Normal spinal cord signal. There are no definite spinal cord lesions. SOFT TISSUES: There is extensive enhancing epidural  and paraspinous metastatic disease, which is resulting in multilevel spinal canal stenosis. There are mild bilateral pleural effusions. DEGENERATIVE CHANGES: No significant disc herniation. At T6, there is posterior bowing of the posterior wall of the vertebral body, which abuts the ventral surface of the spinal cord. There is multilevel spinal canal stenosis resulting from extensive enhancing epidural and paraspinous metastatic disease. IMPRESSION: 1. Extensive enhancing epidural and paraspinous disease with diffuse abnormal/enhancing thoracic marrow signal resulting in multilevel spinal canal stenosis; at T6 there is posterior vertebral body bowing abutting the ventral cord without definite intramedullary cord lesion. Overall appearance is consistent with multiple myeloma. Recommend urgent neurosurgical and/or radiation oncology consultation to assess for impending cord compression. 2. Pathological fractures of T3 and T4, vertebral planar deformity of T6, and downward deformity of the superior endplate of T12. 3. Mild bilateral pleural effusions. Electronically signed by: Evalene Coho MD 11/08/2024 12:44 PM EST RP Workstation:  [id number]   CT CHEST WO CONTRAST Result Date: 11/07/2024 EXAM: CT CHEST WITHOUT CONTRAST 11/07/2024 07:57:35 PM TECHNIQUE: CT of the chest was performed without the administration of intravenous contrast. Multiplanar reformatted images are provided for review. Automated exposure control, iterative reconstruction, and/or weight based adjustment of the mA/kV was utilized to reduce the radiation dose to as low as reasonably achievable. COMPARISON: Portable chest films from 10/31/2024 and 10/26/2024, full body PET CT 02/12/2024, and CTA chest 10/31/2016. CLINICAL HISTORY: Pleural effusion, malignancy suspected. Patient has a history of multiple myeloma. FINDINGS: MEDIASTINUM: Heart is moderately enlarged. There is no pericardial effusion. There are calcifications in the left main and  proximal LAD coronary artery. The cardiac blood pool is hypodense consistent with anemia. The pulmonary trunk is prominent measuring 3.9 cm indicating arterial hypertension (previously 3.8 cm). There is mild prominence of the central veins. Mild aortic atherosclerosis without aneurysm. The great vessels branched normally. There is lobulated substernal soft tissue thickening in the mediastinum which is most likely related to myelomatous involvement of sternum with multiple lytic sternal lesions noted, some with cortical breakthrough posteriorly. The trachea and main bronchi are clear. Central airways are small in caliber, which could be due to active breathing or bronchospasm. LYMPH NODES: There are shotty mediastinal lymph nodes in the AP window, prevascular and subcarinal spaces up to 10 mm. Hilar adenopathy is not well evaluated without contrast. No axillary adenopathy is seen. LUNGS AND PLEURA: There are moderate-sized left greater than right pleural effusions. Some of the left pleural fluid could be loculated anterolaterally. The pleural effusions collapse and obscure the lower lobes on the left greater than right. There is posterior atelectasis in the upper and right middle lobes along the fissures. Linear atelectasis in the bases. There is multifocal lobular pleural thickening in both hemithoraces probably also due to myelomatous involvement affecting the rib cage, otherwise could be other pleural metastases. No pneumothorax. SOFT TISSUES/BONES: There is a 6 mm hypodense nodule in the left lobe of the thyroid . No follow-up imaging is recommended. Generalized edema of the chest wall. There are extensive lytic lesions present throughout the spine, sternum, ribs, and several in the scapulae. There are multiple fractures in various stages of healing of the bilateral ribs. There is a severe age indeterminate pathologic compression fracture of the T6 vertebral body and a moderate compression fracture of T4, at T6  with vertebra plana, retropulsion and soft tissue extending into the spinal canal, narrowing the thecal sac to 7 mm AP with likely mild compressive effect on the thoracic cord. . There is a mild upper plate pathologic wedge compression fracture of the L2 vertebral body as well. UPPER ABDOMEN: Limited images of the upper abdomen demonstrate suspected multiple liver masses, not well seen due to beam hardening artifact. There is a nodular surface contour to the liver, which was also not seen previously. . On the last image, there is a vague 2.7 cm hypodense mass in segment 4, and a suspected 1.4 cm periligamentous mass in this segment also in the last image. There are probably other masses obscured by beam hardening. Further evaluation is recommended. IMPRESSION: 1. Moderate-sized left greater than right pleural effusions, with possible partial loculation of the left effusion, and associated multifocal lobular pleural thickening, likely related to myelomatous involvement or other pleural metastases, new since 02/12/2024. 2. Extensive osseous lytic disease with multiple bilateral rib fractures in various stages of healing , and age indeterminate . severe pathologic compression fracture of T6 with vertebra plana, retropulsion, and  soft tissue extension into the spinal canal with likely mild thoracic cord compression; additional moderate T4 and mild L2 pathologic compression fractures are present, and thoracic spine MRI is recommended. 3. Suspected multiple liver masses, suboptimally evaluated due to beam hardening artifact, and contrast-enhanced CT or MRI of the abdomen is recommended for further evaluation. 4. The pleural effusions collapse and obscure the lower lobes on the left greater than right. Underlying pneumonia or mass would be difficult to exclude. 5. Cardiomegaly with enlarged pulmonary trunk , prominent central veins , edema of the body wall. No pulmonary edema is seen. 6. Aortic and coronary artery  atherosclerosis. Electronically signed by: Francis Quam MD 11/07/2024 08:40 PM EST RP Workstation: [id number]   DG Chest Port 1 View Result Date: 11/07/2024 CLINICAL DATA:  Shortness of breath EXAM: PORTABLE CHEST 1 VIEW COMPARISON:  10/31/2024, PET CT 02/12/2024, chest x-ray 09/08/2024 FINDINGS: Cardiomegaly with vascular congestion. Small right and moderate left pleural effusions, increased compared to prior. Worsened airspace disease at both bases. Aortic atherosclerosis. No pneumothorax IMPRESSION: Cardiomegaly with vascular congestion. Small right and moderate left pleural effusions, increased compared to prior. Worsened airspace disease at both bases may be due to atelectasis or pneumonia. Electronically Signed   By: Luke Bun M.D.   On: 11/07/2024 15:54   Scheduled Meds:  acyclovir   400 mg Oral BID   amiodarone   200 mg Oral BID   calcitonin (salmon)  1 spray Alternating Nares Daily   diltiazem   240 mg Oral Daily   doxycycline   100 mg Oral Q12H   ferrous sulfate   325 mg Oral Q breakfast   fluticasone  furoate-vilanterol  1 puff Inhalation Daily   folic acid   1 mg Oral BID   furosemide   20 mg Intravenous Q12H   thiamine   100 mg Oral Daily   Continuous Infusions:  cefTRIAXone  (ROCEPHIN )  IV 2 g (11/08/24 1714)     LOS: 1 day   Almarie KANDICE Hoots, MD  11/08/2024, 5:20 PM   "

## 2024-11-08 NOTE — Progress Notes (Addendum)
 Carla Hanson   DOB:December 19, 1965   FM#:[id number]      CLINICAL SUMMARY:  Carla Hanson  is a 58 year old female patient admitted on 10/20/2024 with complaints of weakness. Oncologic history is significant for multiple myeloma with bone mets.    ONCOLOGIC HISTORY:  - Patient is diagnosed with refractory IgG lambda multiple myeloma. - Status post multiple chemo lines.  She was started on carfilzomib  + cyclophosphamide  + Dex.  Patient missed many treatments due to personal issues.  The plan is to continue outpatient carfilzomib  and hold Cytoxan . - Due to concerns for progression of disease, we recommended repeat bone marrow biopsy.  - Plan referral to [facility] for [facility] of BITE therapy. - However she has been admitted due to multiple complaints including most recently on 11/07/2024 for acute hypoxic respiratory failure.   ASSESSMENT & PLAN:  Refractory IgG lambda multiple myeloma, with liver mets - Patient now with progression of disease per repeat bone marrow biopsy done 10/27/2024 that confirmed hypercellular bone marrow 80% with markedly increased plasma cells. - Previous plan to refer her to [facility] for bite therapy however unclear if she will be a good candidate due to previous compliance issues and multiple comorbidities. - CT scan done 11/08/2023 shows extensive osseous lytic disease with multiple bilateral rib fractures.   - Medical oncology/Dr. Onesimo following and will make further recommendations.  Acute hypoxic respiratory failure Shortness of breath Pleural effusion - Patient admitted on 11/07/2024 with complaints of low oxygen levels and shortness of breath.  States she was getting short of breath even when she lay down.  O2 was noted to be in the 70s. - Chest x-ray showed moderate-sized left greater than right pleural effusion. - Continue respiratory therapy as ordered  Thoracic cord compression -CT scan also showed mild thoracic cord compression - Seen by neurosurgery, no  surgical intervention planned. - Continue supportive care  Thrombocytopenia - Platelets low 15K today. - Patient's platelets have been chronically low. - Recommend platelet transfusion for counts <10K or <50 K with active bleeding - Continue to monitor CBC with differential  Anemia History of iron deficiency anemia History of folate deficiency - Hemoglobin low 7.2 - Recommend PRBC transfusion for counts <7.0 - Recommend continue ferrous sulfate  and folic acid  - Continue to monitor CBC with differential  Hypercalcemia of malignancy - Calcium  10.8 - Has received Zometa  in the past - Continue to monitor CMP     Code Status Full  Subjective:  Patient seen awake and alert sitting up in bed with O2 via nasal cannula intact.  States that she came back to the hospital because her oxygen level was in the 70s.  Also states she was very short of breath even though she has not been moving around.  Denies active bleeding.  Objective:   Intake/Output Summary (Last 24 hours) at 11/08/2024 1006 Last data filed at 11/08/2024 0649 Gross per 24 hour  Intake 370 ml  Output 800 ml  Net -430 ml     PHYSICAL EXAMINATION: ECOG PERFORMANCE STATUS: 3 - Symptomatic, >50% confined to bed  Vitals:   11/08/24 0739 11/08/24 0815  BP: (!) 147/59   Pulse: 80   Resp: 20   Temp: 97.8 F (36.6 C)   SpO2: 96% 96%   Filed Weights   11/07/24 2308  Weight: 204 lb 2.3 oz (92.6 kg)    GENERAL: alert, no distress and comfortable + obese SKIN: + Large ecchymotic areas on bilateral UE, right greater than left EYES: normal,  conjunctiva are pink and non-injected, +mild sclera icterus OROPHARYNX: no exudate, no erythema and lips, buccal mucosa, and tongue normal  NECK: supple, thyroid  normal size, non-tender, without nodularity LYMPH: no palpable lymphadenopathy in the cervical, axillary or inguinal LUNGS: clear to auscultation and percussion with normal breathing effort HEART: regular rate & rhythm  and no murmurs and no lower extremity edema ABDOMEN: abdomen soft, non-tender and normal bowel sounds MUSCULOSKELETAL: no cyanosis of digits and no clubbing  PSYCH: alert & oriented x 3 with fluent speech NEURO: no focal motor/sensory deficits   All questions were answered. The patient knows to call the clinic with any problems, questions or concerns.   I personally spent a total of 40 minutes minutes in the care of the patient today including preparing to see the patient, getting/reviewing separately obtained history, performing a medically appropriate exam/evaluation, referring and communicating with other health care professionals, documenting clinical information in the EHR, communicating results, and coordinating care.    Olam PARAS Rouson, NP 11/08/2024 10:06 AM    Labs Reviewed:  Lab Results  Component Value Date   WBC 9.6 11/08/2024   HGB 7.2 (L) 11/08/2024   HCT 23.4 (L) 11/08/2024   MCV 89.3 11/08/2024   PLT 15 (LL) 11/08/2024   Recent Labs    11/02/24 0513 11/03/24 0557 11/05/24 1036 11/07/24 1643 11/08/24 0557  NA 140 138 136 140 141  K 4.3 3.8 4.8 3.3* 2.9*  CL 105 103 104 102 104  CO2 24 25 22 27 28   GLUCOSE 102* 92 125* 110* 97  BUN 11 12 11 12 12   CREATININE 0.50 0.44 0.46 0.59 0.58  CALCIUM  8.9 9.6 10.1 11.4* 10.8*  GFRNONAA >60 >60 >60 >60 >60  PROT 7.2 7.2  --   --  7.0  ALBUMIN 2.8* 2.5*  --   --  2.6*  AST 323* 183*  --   --  48*  ALT 10 8  --   --  8  ALKPHOS 235* 246*  --   --  186*  BILITOT 0.7 0.8  --   --  0.5    Studies Reviewed:   CT CHEST WO CONTRAST Result Date: 11/07/2024 EXAM: CT CHEST WITHOUT CONTRAST 11/07/2024 07:57:35 PM TECHNIQUE: CT of the chest was performed without the administration of intravenous contrast. Multiplanar reformatted images are provided for review. Automated exposure control, iterative reconstruction, and/or weight based adjustment of the mA/kV was utilized to reduce the radiation dose to as low as reasonably  achievable. COMPARISON: Portable chest films from 10/31/2024 and 10/26/2024, full body PET CT 02/12/2024, and CTA chest 10/31/2016. CLINICAL HISTORY: Pleural effusion, malignancy suspected. Patient has a history of multiple myeloma. FINDINGS: MEDIASTINUM: Heart is moderately enlarged. There is no pericardial effusion. There are calcifications in the left main and proximal LAD coronary artery. The cardiac blood pool is hypodense consistent with anemia. The pulmonary trunk is prominent measuring 3.9 cm indicating arterial hypertension (previously 3.8 cm). There is mild prominence of the central veins. Mild aortic atherosclerosis without aneurysm. The great vessels branched normally. There is lobulated substernal soft tissue thickening in the mediastinum which is most likely related to myelomatous involvement of sternum with multiple lytic sternal lesions noted, some with cortical breakthrough posteriorly. The trachea and main bronchi are clear. Central airways are small in caliber, which could be due to active breathing or bronchospasm. LYMPH NODES: There are shotty mediastinal lymph nodes in the AP window, prevascular and subcarinal spaces up to 10 mm. Hilar adenopathy is not  well evaluated without contrast. No axillary adenopathy is seen. LUNGS AND PLEURA: There are moderate-sized left greater than right pleural effusions. Some of the left pleural fluid could be loculated anterolaterally. The pleural effusions collapse and obscure the lower lobes on the left greater than right. There is posterior atelectasis in the upper and right middle lobes along the fissures. Linear atelectasis in the bases. There is multifocal lobular pleural thickening in both hemithoraces probably also due to myelomatous involvement affecting the rib cage, otherwise could be other pleural metastases. No pneumothorax. SOFT TISSUES/BONES: There is a 6 mm hypodense nodule in the left lobe of the thyroid . No follow-up imaging is recommended.  Generalized edema of the chest wall. There are extensive lytic lesions present throughout the spine, sternum, ribs, and several in the scapulae. There are multiple fractures in various stages of healing of the bilateral ribs. There is a severe age indeterminate pathologic compression fracture of the T6 vertebral body and a moderate compression fracture of T4, at T6 with vertebra plana, retropulsion and soft tissue extending into the spinal canal, narrowing the thecal sac to 7 mm AP with likely mild compressive effect on the thoracic cord. . There is a mild upper plate pathologic wedge compression fracture of the L2 vertebral body as well. UPPER ABDOMEN: Limited images of the upper abdomen demonstrate suspected multiple liver masses, not well seen due to beam hardening artifact. There is a nodular surface contour to the liver, which was also not seen previously. . On the last image, there is a vague 2.7 cm hypodense mass in segment 4, and a suspected 1.4 cm periligamentous mass in this segment also in the last image. There are probably other masses obscured by beam hardening. Further evaluation is recommended. IMPRESSION: 1. Moderate-sized left greater than right pleural effusions, with possible partial loculation of the left effusion, and associated multifocal lobular pleural thickening, likely related to myelomatous involvement or other pleural metastases, new since 02/12/2024. 2. Extensive osseous lytic disease with multiple bilateral rib fractures in various stages of healing , and age indeterminate . severe pathologic compression fracture of T6 with vertebra plana, retropulsion, and soft tissue extension into the spinal canal with likely mild thoracic cord compression; additional moderate T4 and mild L2 pathologic compression fractures are present, and thoracic spine MRI is recommended. 3. Suspected multiple liver masses, suboptimally evaluated due to beam hardening artifact, and contrast-enhanced CT or MRI of  the abdomen is recommended for further evaluation. 4. The pleural effusions collapse and obscure the lower lobes on the left greater than right. Underlying pneumonia or mass would be difficult to exclude. 5. Cardiomegaly with enlarged pulmonary trunk , prominent central veins , edema of the body wall. No pulmonary edema is seen. 6. Aortic and coronary artery atherosclerosis. Electronically signed by: Francis Quam MD 11/07/2024 08:40 PM EST RP Workstation: [id number]   DG Chest Port 1 View Result Date: 11/07/2024 CLINICAL DATA:  Shortness of breath EXAM: PORTABLE CHEST 1 VIEW COMPARISON:  10/31/2024, PET CT 02/12/2024, chest x-ray 09/08/2024 FINDINGS: Cardiomegaly with vascular congestion. Small right and moderate left pleural effusions, increased compared to prior. Worsened airspace disease at both bases. Aortic atherosclerosis. No pneumothorax IMPRESSION: Cardiomegaly with vascular congestion. Small right and moderate left pleural effusions, increased compared to prior. Worsened airspace disease at both bases may be due to atelectasis or pneumonia. Electronically Signed   By: Luke Bun M.D.   On: 11/07/2024 15:54   DG CHEST PORT 1 VIEW Result Date: 10/31/2024 CLINICAL DATA:  Pleural effusion.  Multiple myeloma. EXAM: PORTABLE CHEST 1 VIEW COMPARISON:  10/26/2024 FINDINGS: Stable cardiomegaly and pulmonary vascular congestion. Stable mild pleural thickening or tiny pleural effusions. No significant change in left lower lobe atelectasis or scarring. No new or worsening areas of pulmonary opacity are seen. IMPRESSION: Stable cardiomegaly and pulmonary vascular congestion. Stable left lower lobe atelectasis or scarring. Stable tiny bilateral pleural effusions versus pleural thickening. Electronically Signed   By: Norleen DELENA Kil M.D.   On: 10/31/2024 09:54   ECHOCARDIOGRAM COMPLETE Result Date: 10/30/2024    ECHOCARDIOGRAM REPORT   Patient Name:   ANASHA PERFECTO [facility] Date of Exam: 10/30/2024 Medical Rec #:   [id number]      Height:       61.5 in Accession #:    [id number]     Weight:       204.1 lb Date of Birth:  1965-12-09     BSA:          1.917 m Patient Age:    58 years       BP:           126/70 mmHg Patient Gender: F              HR:           86 bpm. Exam Location:  Inpatient Procedure: 2D Echo, Cardiac Doppler, Color Doppler and Intracardiac            Opacification Agent (Both Spectral and Color Flow Doppler were            utilized during procedure). Indications:    Atrial Flutter  History:        Patient has prior history of Echocardiogram examinations, most                 recent 12/05/2021.  Sonographer:    Philomena Daring Referring Phys: [id number] JOETTE PEBBLES IMPRESSIONS  1. Left ventricular ejection fraction, by estimation, is 60 to 65%. The left ventricle has normal function. The left ventricle has no regional wall motion abnormalities. Left ventricular diastolic parameters are indeterminate.  2. Right ventricular systolic function is normal. The right ventricular size is mildly enlarged. There is mildly elevated pulmonary artery systolic pressure. The estimated right ventricular systolic pressure is 39.9 mmHg.  3. Large pleural effusion in the left lateral region.  4. The mitral valve was not well visualized. No evidence of mitral valve regurgitation.  5. The aortic valve is tricuspid. Aortic valve regurgitation is not visualized. No aortic stenosis is present.  6. The inferior vena cava is dilated in size with <50% respiratory variability, suggesting right atrial pressure of 15 mmHg. Comparison(s): Prior images reviewed side by side. 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 FINDINGS  Left Ventricle: Left ventricular ejection fraction, by estimation, is 60 to 65%. The left ventricle has normal function. The left ventricle has no regional wall motion abnormalities. The left ventricular internal cavity size was normal in size. There is  no left ventricular hypertrophy. Left ventricular diastolic  parameters are indeterminate. Right Ventricle: The right ventricular size is mildly enlarged. No increase in right ventricular wall thickness. Right ventricular systolic function is normal. There is mildly elevated pulmonary artery systolic pressure. The tricuspid regurgitant velocity is 2.23 m/s, and with an assumed right atrial pressure of 20 mmHg, the estimated right ventricular systolic pressure is 39.9 mmHg. Left Atrium: Left atrial size was normal in size. Right Atrium: Right atrial size was normal in size. Pericardium: There is no evidence of  pericardial effusion. Mitral Valve: The mitral valve was not well visualized. No evidence of mitral valve regurgitation. Tricuspid Valve: The tricuspid valve is normal in structure. Tricuspid valve regurgitation is trivial. No evidence of tricuspid stenosis. Aortic Valve: The aortic valve is tricuspid. Aortic valve regurgitation is not visualized. No aortic stenosis is present. Aortic valve mean gradient measures 9.0 mmHg. Aortic valve peak gradient measures 18.6 mmHg. Aortic valve area, by VTI measures 2.62  cm. Pulmonic Valve: The pulmonic valve was normal in structure. Pulmonic valve regurgitation is trivial. No evidence of pulmonic stenosis. Aorta: The aortic root and ascending aorta are structurally normal, with no evidence of dilitation. Venous: The inferior vena cava is dilated in size with less than 50% respiratory variability, suggesting right atrial pressure of 15 mmHg. IAS/Shunts: No atrial level shunt detected by color flow Doppler. Additional Comments: There is a large pleural effusion in the left lateral region.  LEFT VENTRICLE PLAX 2D LVIDd:         5.00 cm   Diastology LVIDs:         3.40 cm   LV e' medial:    7.72 cm/s LV PW:         1.00 cm   LV E/e' medial:  12.9 LV IVS:        0.90 cm   LV e' lateral:   11.00 cm/s LVOT diam:     2.00 cm   LV E/e' lateral: 9.1 LV SV:         87 LV SV Index:   45 LVOT Area:     3.14 cm  RIGHT VENTRICLE             IVC  RV S prime:     20.40 cm/s  IVC diam: 2.60 cm TAPSE (M-mode): 3.2 cm LEFT ATRIUM             Index        RIGHT ATRIUM           Index LA Vol (A2C):   53.7 ml 28.01 ml/m  RA Area:     17.00 cm LA Vol (A4C):   34.2 ml 17.84 ml/m  RA Volume:   39.90 ml  20.82 ml/m LA Biplane Vol: 43.6 ml 22.75 ml/m  AORTIC VALVE AV Area (Vmax):    2.51 cm AV Area (Vmean):   2.50 cm AV Area (VTI):     2.62 cm AV Vmax:           215.50 cm/s AV Vmean:          137.500 cm/s AV VTI:            0.333 m AV Peak Grad:      18.6 mmHg AV Mean Grad:      9.0 mmHg LVOT Vmax:         172.00 cm/s LVOT Vmean:        109.500 cm/s LVOT VTI:          0.278 m LVOT/AV VTI ratio: 0.83  AORTA Ao Root diam: 3.20 cm Ao Asc diam:  3.00 cm MITRAL VALVE                TRICUSPID VALVE MV Area (PHT): 4.71 cm     TR Peak grad:   19.9 mmHg MV Decel Time: 161 msec     TR Vmax:        223.00 cm/s MV E velocity: 99.80 cm/s MV A velocity: 101.00 cm/s  SHUNTS MV E/A ratio:  0.99         Systemic VTI:  0.28 m                             Systemic Diam: 2.00 cm Stanly Leavens MD Electronically signed by Stanly Leavens MD Signature Date/Time: 10/30/2024/3:14:55 PM    Final    IR BONE MARROW BIOPSY & ASPIRATION Result Date: 10/27/2024 INDICATION: Myeloma, reassess marrow status EXAM: FLUOROSCOPICALLY GUIDED RIGHT ILIAC BONE MARROW ASPIRATION AND CORE BIOPSY Date:  10/27/2024 10/27/2024 10:19 am Radiologist:  M. Frederic Specking, MD Guidance: Fluoroscopy FLUOROSCOPY: Fluoroscopy Time:  (56 mGy). MEDICATIONS: 1% lidocaine  local ANESTHESIA/SEDATION: 1.5 mg IV Versed ; 100 mcg IV Fentanyl  Moderate Sedation Time:  11 minutes The patient was continuously monitored during the procedure by the interventional radiology nurse under my direct supervision. CONTRAST:  None. COMPLICATIONS: None immediate PROCEDURE: Informed consent was obtained from the patient following explanation of the procedure, risks, benefits and alternatives. The patient understands, agrees and  consents for the procedure. All questions were addressed. A time out was performed. The patient was positioned prone and fluoroscopic localization was performed of the pelvis to demonstrate the iliac marrow spaces. Maximal barrier sterile technique utilized including caps, mask, sterile gowns, sterile gloves, large sterile drape, hand hygiene, and Betadine prep. Under sterile conditions and local anesthesia, an 11 gauge coaxial bone biopsy needle was advanced into the right iliac marrow space. Needle position was confirmed with fluoroscopic imaging. Initially, bone marrow aspiration was performed. Next, the 11 gauge outer cannula was utilized to obtain a right iliac bone marrow core biopsy. Needle was removed. Hemostasis was obtained with compression. The patient tolerated the procedure well. Samples were prepared with the cytotechnologist. No immediate complications. IMPRESSION: Fluoroscopically guided right iliac bone marrow aspiration and core biopsy. Electronically Signed   By: CHRISTELLA.  Shick M.D.   On: 10/27/2024 10:31   DG CHEST PORT 1 VIEW Result Date: 10/26/2024 CLINICAL DATA:  Dyspnea. EXAM: PORTABLE CHEST 1 VIEW COMPARISON:  09/08/2024 FINDINGS: The heart is enlarged. Mediastinal contours are stable. Vascular congestion. There are small bilateral pleural effusions. Ill-defined bilateral infrahilar opacities. No pneumothorax. IMPRESSION: 1. Cardiomegaly with vascular congestion and small bilateral pleural effusions. 2. Ill-defined bilateral infrahilar opacities, atelectasis versus pneumonia. Electronically Signed   By: Andrea Gasman M.D.   On: 10/26/2024 16:52    ADDENDUM  .Patient was Personally and independently interviewed, examined and relevant elements of the history of present illness were reviewed in details and an assessment and plan was created. All elements of the patient's history of present illness , assessment and plan were discussed in details with Lisa Rouson NP. The above  documentation reflects our combined findings assessment and plan.   Patient presenting with increasing shortness of breath and noted to have symptomatic pleural effusions. Also noted to have significant pancytopenia with symptomatic anemia and significant thrombocytopenia due to myeloma progression. Patient has had worsening performance status currently with an ECOG PS of 3. Patient's CT of the chest and subsequent MRI of the thorax and spine shows extensive osseous involvement and chronic vertebral compression fractures.  At T6 there is posterior vertebral body abutment of the ventral cord.Extensive enhancing epidural and paraspinous disease with diffuse abnormal/enhancing thoracic marrow signal resulting in multilevel spinal canal Stenosis. Overall concern for possible impending cord compression though the patient does not report any significant thoracic spinal pain or lower extremity weakness or bowel or bladder changes. Neurosurgery saw the patient and appropriately suggested  against surgical intervention. Radiation oncology has been consulted.  I had a detailed goals of care discussion with the patient.  At this point she is undecided about doing a thoracentesis for her shortness of breath and significant pleural effusion. We discussed that she is having pretty aggressive progressive multiple myeloma that is affecting her functional status causing severe pancytopenia, persistent malignant hypercalcemia. She has been unable to tolerate and maintain compliance with her current treatment as outpatient and has not been able to get her PET scan that was ordered a long time ago. She is having repeated hospitalizations and is unable to follow-up with [facility] for evaluation for possible bite therapy or CAR-T cell therapy.  We discussed that we are not doing step ups for the bite therapy at here at [facility]. Patient was offered the option of best supportive care through hospice and is  disinclined to pursue this. She was referred to home physical therapy health evaluation but was not able to benefit from this and she was readmitted fairly quickly. No specific inpatient oncologic treatments can be done at this time. Reasonable to involve palliative care for continued goals of care discussion. If the patient can be stabilized with PRBC transfusions and thoracentesis we can continue the transfusion support as outpatient and try to get her to [facility] for consideration of BITE therapy. Uncertain level of social support might limit how she gets to [facility] and back-and-forth for her treatment.  Japji Kok MD MS

## 2024-11-09 ENCOUNTER — Ambulatory Visit
Admit: 2024-11-09 | Discharge: 2024-11-09 | Disposition: A | Discharge status: Home / Self Care | Attending: Radiation Oncology | Admitting: Radiation Oncology

## 2024-11-09 ENCOUNTER — Inpatient Hospital Stay (HOSPITAL_COMMUNITY)

## 2024-11-09 DIAGNOSIS — R531 Weakness: Secondary | ICD-10-CM | POA: Diagnosis not present

## 2024-11-09 DIAGNOSIS — Z515 Encounter for palliative care: Secondary | ICD-10-CM

## 2024-11-09 DIAGNOSIS — Z7189 Other specified counseling: Secondary | ICD-10-CM | POA: Diagnosis not present

## 2024-11-09 DIAGNOSIS — C9000 Multiple myeloma not having achieved remission: Secondary | ICD-10-CM | POA: Diagnosis not present

## 2024-11-09 DIAGNOSIS — R52 Pain, unspecified: Secondary | ICD-10-CM | POA: Diagnosis not present

## 2024-11-09 LAB — COMPREHENSIVE METABOLIC PANEL WITH GFR
ALT: 9 U/L (ref 0–44)
AST: 88 U/L — ABNORMAL HIGH (ref 15–41)
Albumin: 2.9 g/dL — ABNORMAL LOW (ref 3.5–5.0)
Alkaline Phosphatase: 189 U/L — ABNORMAL HIGH (ref 38–126)
Anion gap: 9 (ref 5–15)
BUN: 15 mg/dL (ref 6–20)
CO2: 28 mmol/L (ref 22–32)
Calcium: 10.7 mg/dL — ABNORMAL HIGH (ref 8.9–10.3)
Chloride: 101 mmol/L (ref 98–111)
Creatinine, Ser: 0.52 mg/dL (ref 0.44–1.00)
GFR, Estimated: >60 mL/min
Glucose, Bld: 201 mg/dL — ABNORMAL HIGH (ref 70–99)
Potassium: 4.2 mmol/L (ref 3.5–5.1)
Sodium: 138 mmol/L (ref 135–145)
Total Bilirubin: 0.4 mg/dL (ref 0.0–1.2)
Total Protein: 7.4 g/dL (ref 6.5–8.1)

## 2024-11-09 LAB — TYPE AND SCREEN
ABO/RH(D): A POS
Antibody Screen: NEGATIVE
Unit division: 0

## 2024-11-09 LAB — BPAM RBC
Blood Product Expiration Date: 202601312359
ISSUE DATE / TIME: 202601052152
Unit Type and Rh: 6200

## 2024-11-09 MED ORDER — DEXAMETHASONE 4 MG PO TABS
4.0000 mg | ORAL_TABLET | Freq: Four times a day (QID) | ORAL | Status: DC
  Administered 2024-11-09 – 2024-11-14 (×19): 4 mg via ORAL
  Filled 2024-11-09 (×20): qty 1

## 2024-11-09 MED ORDER — POTASSIUM CHLORIDE CRYS ER 20 MEQ PO TBCR
20.0000 meq | EXTENDED_RELEASE_TABLET | Freq: Every day | ORAL | Status: DC
  Administered 2024-11-09 – 2024-11-25 (×17): 20 meq via ORAL
  Filled 2024-11-09 (×19): qty 1

## 2024-11-09 MED ORDER — ALUM & MAG HYDROXIDE-SIMETH 200-200-20 MG/5ML PO SUSP
30.0000 mL | ORAL | Status: DC | PRN
  Administered 2024-11-09 – 2024-11-10 (×2): 30 mL via ORAL
  Filled 2024-11-09 (×2): qty 30

## 2024-11-09 MED ORDER — IPRATROPIUM-ALBUTEROL 0.5-2.5 (3) MG/3ML IN SOLN
3.0000 mL | Freq: Once | RESPIRATORY_TRACT | Status: AC | PRN
  Administered 2024-11-10: 3 mL via RESPIRATORY_TRACT
  Filled 2024-11-09: qty 3

## 2024-11-09 MED ORDER — MAGNESIUM SULFATE 2 GM/50ML IV SOLN
2.0000 g | Freq: Once | INTRAVENOUS | Status: AC
  Administered 2024-11-09: 2 g via INTRAVENOUS
  Filled 2024-11-09: qty 50

## 2024-11-09 MED ORDER — FUROSEMIDE 10 MG/ML IJ SOLN
40.0000 mg | Freq: Every day | INTRAMUSCULAR | Status: DC
  Administered 2024-11-09 – 2024-11-12 (×4): 40 mg via INTRAVENOUS
  Filled 2024-11-09 (×5): qty 4

## 2024-11-09 NOTE — Plan of Care (Signed)

## 2024-11-09 NOTE — Consult Note (Signed)
 "                                                                                   Consultation Note Date: 11/09/2024   Patient Name: Carla Hanson  DOB: [birth year]  MRN: [id number]  Age / Sex: 59 y.o., female  PCP: Anders Otto DASEN, MD Referring Physician: Will Almarie MATSU, MD  Reason for Consultation: Establishing goals of care  HPI/Patient Profile: 58 y.o. female admitted on 11/07/2024    Clinical Assessment and Goals of Care: 59 year old lady who lives at home with her mother, she is under the care of Dr. Onesimo for life-limiting illness of multiple myeloma with bone mets.  Patient admitted with weakness and shortness of breath Patient has refractory IgG lambda multiple myeloma she is status post multiple lines of chemotherapy. Patient has had concern for progression of disease and was recommended to undergo repeat bone marrow biopsy, she was also under consideration for being referred to [facility] for consideration of bite therapy Patient admitted to hospital medicine service seen and evaluated by medical oncology Patient now with progression of disease and repeat bone marrow biopsy was done on 10-27-2024 that confirmed hypercellular bone marrow 80% with markedly increased plasma cells. CT scan done on 1-4 shows extensive osseous lytic lesions disease and multiple bilateral rib fractures, she has been referred to radiation oncology as well. CT scan additionally also showed mild thoracic cord compression, she was placed on steroids, seen by neurosurgery with no surgical intervention planned and therefore radiation and supportive care. She is to continue PRBC transfusions as well as transfusions of ferrous sulfate  and folic acid  and CBC is to be monitored Palliative care consulted for CODE STATUS and broad goals of care discussions. Chart reviewed Patient seen and examined Palliative medicine is specialized medical care for people living with serious illness. It focuses on providing  relief from the symptoms and stress of a serious illness. The goal is to improve quality of life for both the patient and the family. Goals of care: Broad aims of medical therapy in relation to the patient's values and preferences. Our aim is to provide medical care aimed at enabling patients to achieve the goals that matter most to them, given the circumstances of their particular medical situation and their constraints.   NEXT OF KIN  Wants mother and eldest daughter to be her medical decision makers, gets tearful and states she is not ready to have chaplain come by for making HCPOA paperwork.   SUMMARY OF RECOMMENDATIONS   CODE STATUS and goals of care discussions undertaken with the patient.  I reviewed with her frankly but compassionately about the serious nature of her illness.  Importance of advance care planning was also stressed.  Patient states that she wishes to continue with full code/full scope care for now.  She is hopeful to get established with [facility] and to be considered for bite therapy.  She states that she has the social support on the transportation to get to [facility] and would like to continue with any and all offered therapies and interventions for her disease. Recommend continued outpatient palliative support in the community as well as  at [facility].  Code Status/Advance Care Planning: Full code   Symptom Management:     Palliative Prophylaxis:  Delirium Protocol  Additional Recommendations (Limitations, Scope, Preferences): Full Scope Treatment  Psycho-social/Spiritual:  Desire for further Chaplaincy support:yes Additional Recommendations: Caregiving  Support/Resources  Prognosis:  Unable to determine  Discharge Planning: To Be Determined      Primary Diagnoses: Present on Admission:  History of pulmonary embolism  Hyperlipidemia  Essential hypertension  Iron deficiency anemia  COPD, moderate (HCC)  Hypercalcemia of  malignancy  Multiple myeloma not having achieved remission (HCC)  Thrombocytopenia  Acute respiratory failure (HCC)   I have reviewed the medical record, interviewed the patient and family, and examined the patient. The following aspects are pertinent.  Past Medical History:  Diagnosis Date   Alcohol  abuse    Anxiety    Back pain    Bipolar disorder (HCC)    Bronchitis    COPD exacerbation (HCC) 05/10/2016   Drug use    Edema 02/24/2024   History of blood clots    Hyperlipidemia    Hypertension    Influenza A 12/01/2018   Joint pain    Left leg DVT (HCC) 09/29/2013   Lipoma    Abdomen   LIPOMA 01/20/2008   Obesity    Occasional tremors 05/12/2019   Osteoarthritis of right knee    Other fatigue    Painful menstrual periods 01/05/2012   Pneumonia    Prediabetes    Seizure (HCC)    Shortness of breath on exertion    Stroke (HCC)    Tobacco abuse    Social History   Socioeconomic History   Marital status: Divorced    Spouse name: Not on file   Number of children: 4   Years of education: 10   Highest education level: Not on file  Occupational History   Occupation: stay at home  Tobacco Use   Smoking status: Former    Current packs/day: 0.00    Average packs/day: 0.5 packs/day for 35.0 years (17.5 ttl pk-yrs)    Types: Cigarettes    Start date: 11/04/1981    Quit date: 10/2016    Years since quitting: 8.1   Smokeless tobacco: Never  Vaping Use   Vaping status: Never Used  Substance and Sexual Activity   Alcohol  use: No    Alcohol /week: 0.0 standard drinks of alcohol     Comment: Quit 10/2016   Drug use: Yes    Types: Marijuana   Sexual activity: Not Currently    Comment: tubal  Other Topics Concern   Not on file  Social History Narrative   Unemployed, 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  Considering nursing.    Lives at home alone   Right-handed   Caffeine: not much    Social Drivers of Health   Tobacco Use: Medium Risk (11/08/2024)   Patient History    Smoking Tobacco Use: Former    Smokeless Tobacco Use: Never    Passive Exposure: Not on file  Financial Resource Strain: Low Risk (05/14/2024)   Overall Financial Resource Strain (CARDIA)    Difficulty of Paying Living Expenses: Not hard at all  Food Insecurity: No Food Insecurity (11/07/2024)   Epic    Worried About Programme Researcher, Broadcasting/film/video in the Last Year: Never true    Ran Out of Food in the Last Year: Never true  Transportation Needs: No Transportation Needs (  11/07/2024)   Epic    Lack of Transportation (Medical): No    Lack of Transportation (Non-Medical): No  Physical Activity: Insufficiently Active (05/14/2024)   Exercise Vital Sign    Days of Exercise per Week: 3 days    Minutes of Exercise per Session: 20 min  Stress: No Stress Concern Present (05/14/2024)   [employer] of Occupational Health - Occupational Stress Questionnaire    Feeling of Stress: Not at all  Social Connections: Moderately Integrated (10/20/2024)   Social Connection and Isolation Panel    Frequency of Communication with Friends and Family: More than three times a week    Frequency of Social Gatherings with Friends and Family: More than three times a week    Attends Religious Services: More than 4 times per year    Active Member of [employer] or Organizations: Yes    Attends Banker Meetings: More than 4 times per year    Marital Status: Divorced  Depression (PHQ2-9): Low Risk (09/08/2024)   Depression (PHQ2-9)    PHQ-2 Score: 1  Alcohol  Screen: Not on file  Housing: Low Risk (11/07/2024)   Epic    Unable to Pay for Housing in the Last Year: No    Number of Times Moved in the Last Year: 0    Homeless in the Last Year: No  Utilities: Not At Risk (11/07/2024)   Epic    Threatened with loss of utilities: No  Health Literacy: Not on file   Family History  Problem Relation Age of Onset   Other Mother         High blood pressure runs in the family   Obesity Mother    Asthma Father    Heart attack Father    Obesity Father    Diabetes Maternal Aunt    Breast cancer Neg Hx    Scheduled Meds:  acyclovir   400 mg Oral BID   amiodarone   200 mg Oral BID   calcitonin (salmon)  1 spray Alternating Nares Daily   dexamethasone   4 mg Oral Q6H   diltiazem   240 mg Oral Daily   doxycycline   100 mg Oral Q12H   ferrous sulfate   325 mg Oral Q breakfast   fluticasone  furoate-vilanterol  1 puff Inhalation Daily   folic acid   1 mg Oral BID   thiamine   100 mg Oral Daily   Continuous Infusions:  cefTRIAXone  (ROCEPHIN )  IV Stopped (11/08/24 1744)   PRN Meds:.acetaminophen  **OR** acetaminophen , HYDROmorphone  (DILAUDID ) injection Medications Prior to Admission:  Prior to Admission medications  Medication Sig Start Date End Date Taking? Authorizing Provider  acyclovir  (ZOVIRAX ) 400 MG tablet Take 1 tablet (400 mg total) by mouth 2 (two) times daily. 08/06/24  Yes Onesimo Emaline Brink, MD  albuterol  (PROVENTIL ) (2.5 MG/3ML) 0.083% nebulizer solution Take 3 mLs (2.5 mg total) by nebulization every 6 (six) hours as needed for wheezing or shortness of breath. 02/06/24  Yes Anders Otto DASEN, MD  albuterol  (VENTOLIN  HFA) 108 (90 Base) MCG/ACT inhaler Inhale 2 puffs into the lungs every 6 (six) hours as needed for wheezing or shortness of breath. 10/18/24  Yes Anders Otto DASEN, MD  amiodarone  (PACERONE ) 200 MG tablet Take 1 tablet (200 mg total) by mouth 2 (two) times daily. 11/05/24  Yes Jadine Toribio SQUIBB, MD  calcitonin, salmon, (MIACALCIN MARCIANA) 200 UNIT/ACT nasal spray Place 1 spray into alternate nostrils daily. 11/06/24  Yes Jadine Toribio SQUIBB, MD  cetirizine  (ZYRTEC ) 10 MG tablet Take 1 tablet (10 mg total) by  mouth daily. Patient taking differently: Take 10 mg by mouth daily as needed for rhinitis or allergies. 08/06/24  Yes Anders Otto DASEN, MD  diltiazem  (CARDIZEM  CD) 240 MG 24 hr capsule Take 1 capsule (240 mg  total) by mouth daily. 11/06/24 02/04/25 Yes Jadine Toribio SQUIBB, MD  ferrous sulfate  325 (65 FE) MG tablet Take 325 mg by mouth daily with breakfast.   Yes [provider]  folic acid  (FOLVITE ) 1 MG tablet Take 1 tablet (1 mg total) by mouth 2 (two) times daily. 09/15/24  Yes Onesimo Emaline Brink, MD  potassium chloride  SA (KLOR-CON  M) 20 MEQ tablet Take 1 tablet (20 mEq total) by mouth 2 (two) times daily. Patient taking differently: Take 40 mEq by mouth 2 (two) times daily. 10/18/24 11/21/24 Yes Onesimo Emaline Brink, MD  prochlorperazine  (COMPAZINE ) 10 MG tablet Take 1 tablet (10 mg total) by mouth every 6 (six) hours as needed for nausea or vomiting 07/06/24  Yes Onesimo Emaline Brink, MD  SYMBICORT  160-4.5 MCG/ACT inhaler Inhale into the lungs. 11/05/24  Yes [provider]  SYSTANE ULTRA 0.4-0.3 % SOLN Place 1 drop into both eyes 3 (three) times daily as needed (for dryness).   Yes [provider]  thiamine  (VITAMIN B1) 100 MG tablet Take 1 tablet (100 mg total) by mouth daily. 09/15/24  Yes Neomi Lis T, PA-C  TYLENOL  500 MG tablet Take 500-1,000 mg by mouth every 6 (six) hours as needed for mild pain (pain score 1-3) (or headaches).   Yes [provider]  tiZANidine  (ZANAFLEX ) 4 MG tablet Take 1 tablet (4 mg total) by mouth 2 (two) times daily as needed for muscle spasms. Patient not taking: Reported on 11/07/2024 08/06/24   Anders Otto DASEN, MD  rosuvastatin  (CRESTOR ) 20 MG tablet Take 1 tablet (20 mg total) by mouth daily. 05/20/24 10/06/24  Anders Otto DASEN, MD   Allergies[1] Review of Systems +weakness  Physical Exam Awake alert Sitting up in bed Edema both UE and LE Regular work of breathing Abdomen not distended  Vital Signs: BP 133/68 (BP Location: Right Arm)   Pulse 78   Temp 98.1 F (36.7 C) (Oral)   Resp 18   Ht 5' 1.5 (1.562 m)   Wt 92.6 kg   LMP  (LMP Unknown)   SpO2 98%   BMI 37.95 kg/m  Pain Scale: 0-10   Pain Score: 0-No  pain   SpO2: SpO2: 98 % O2 Device:SpO2: 98 % O2 Flow Rate: .O2 Flow Rate (L/min): 3 L/min  IO: Intake/output summary:  Intake/Output Summary (Last 24 hours) at 11/09/2024 1445 Last data filed at 11/09/2024 0800 Gross per 24 hour  Intake 1726.89 ml  Output 2650 ml  Net -923.11 ml    LBM: Last BM Date : 11/08/24 Baseline Weight: Weight: 92.6 kg Most recent weight: Weight: 92.6 kg     Palliative Assessment/Data:   PPS 50%  Time In:  11 Time Out:  12.15 Time Total:  75 Greater than 50%  of this time was spent counseling and coordinating care related to the above assessment and plan.  Signed by: Lonia Serve, MD   Please contact Palliative Medicine Team phone at [phone] for questions and concerns.  For individual provider: See Amion                 [1]  Allergies Allergen Reactions   Latuda  [Lurasidone ] Other (See Comments)    Made leg muscles twitch (went off this herself in December, 2017)   "

## 2024-11-09 NOTE — Progress Notes (Signed)
 " PROGRESS NOTE    Carla Hanson  FMW:[id number] DOB: [birth year] DOA: 11/07/2024 PCP: Anders Otto DASEN, MD  Brief Narrative: 59 y.o. female with history of multiple myeloma, intracranial hemorrhage while being on Xarelto , recently admitted from 10/20/2024 through 11/05/2024 for symptomatic anemia had required at least 5 units of PRBC transfusion also has severe thrombocytopenia during the stay patient also had new onset A-fib difficult to control was placed on amiodarone  and Cardizem  not on anticoagulation due to history of severe thrombocytopenia and intracranial bleed previously discharged about 2 days ago presents with increasing shortness of breath since yesterday.  Patient states that at baseline she is nonambulatory.  Was getting short of breath more on lying down with no associated productive cough fever chills.  Denies any chest pain.  At home patient's home health aide found that patient was desatting to 74% and had to be placed on oxygen and was referred to ER.   ED Course: In the ER patient was hypoxic requiring 4 L oxygen.  Patient was afebrile.  Chest x-ray showed bilateral pleural effusion more on the left side.  CT chest shows moderate-sized left greater than right pleural effusion with possible partial loculation of the left effusion.  Associated multifocal lobular pleural thickening likely related to myelomatous involvement of the pleural metastasis new since April 2025.  Also has age indeterminant T6 compression fracture with mild cord compression.  Multiple rib fracture age.  proBNP was 2000 troponins were flat at 49.  WBC 12.3 hemoglobin 8.3 platelets 18.  Patient was started on Lasix  antibiotics admitted for further workup.    Assessment & Plan:   Principal Problem:   Acute respiratory failure (HCC) Active Problems:   Hyperlipidemia   Essential hypertension   History of pulmonary embolism   Iron deficiency anemia   History of hemorrhagic cerebrovascular accident (CVA) without  residual deficits   COPD, moderate (HCC)   Hypercalcemia of malignancy   Multiple myeloma not having achieved remission (HCC)   Thrombocytopenia   Acute respiratory failure with hypoxia (HCC)   Pleural effusion   Compression fracture of T6 vertebra (HCC)   Acute on chronic heart failure with preserved ejection fraction (HFpEF) (HCC)   Hypoxia   #1 acute hypoxic respiratory failure multifactorial, probably related to multiple myeloma versus severe hypoalbuminemia versus diastolic heart failure versus pneumonia.  She was not taking her diuretics at home due to soft BP.  She was discharged 2 days ago from the hospital. Her BNP was found to be elevated.  She was started on diuresis.  Continue diuresis as long as she can tolerate monitor blood pressure.  Unfortunately due to very low platelets risk of bleeding is high with thoracentesis.  Patient wants to think about thoracentesis and talk to the family and decide whether she wants to have it done or not. Patient was given Lasix , was started on Rocephin  azithromycin  but changed to Rocephin  and Doxy due to concern for prolonged QT.  #2 multiple compression fractures of the thoracic spine mild cord compression.  She really do not have any complaints of back pain, no bowel or bladder complaints, no complaints with sensation she is able to move her lower extremities.  At baseline she does not walk a lot at home but she is able to walk.   MRI thoracic spine - Extensive enhancing epidural and paraspinous disease with diffuse abnormal/enhancing thoracic marrow signal resulting in multilevel spinal canal stenosis; at T6 there is posterior vertebral body bowing abutting the ventral cord without  definite intramedullary cord lesion. Overall appearance is consistent with multiple myeloma. Recommend urgent neurosurgical and/or radiation oncology consultation to assess for impending cord compression. Pathological fractures of T3 and T4, vertebral planar deformity  of T6, and downward deformity of the superior endplate of T12.Mild bilateral pleural effusions continue Decadron . Neurosurgery does not think she is a candidate for surgery with bilateral malignant pleural effusion and thrombocytopenia and multiple comorbidities.  Patient seen by radiation oncology who would like to pursue radiation to that area however patient wants to talk to her family about this prior to making up her mind.  #3 severe hypokalemia resolved with aggressive repletion.  Mag 1.5 replete and recheck in AM.  #4 symptomatic anemia with severe thrombocytopenia with no evidence of active acute bleed.   She got 1 unit of packed RBC.   Platelets still at 15 since she is not bleeding we will hold off on transfusion or if she decides to have thoracentesis then we will transfuse her prior to that.    #5 atrial flutter on amiodarone  and diltiazem  not on anticoagulation due to severe thrombocytopenia and history of intracranial bleed  #6 history of DVT and PVD only on aspirin  due to above  #7 hypercalcemia secondary to multiple myeloma gets monthly Zometa  on calcitonin.ca 10.7.  #8 history of hypertension on Cardizem  and Lasix  continue  #9 stage II left buttock pressure injury present on admission   Wound 11/07/24 2300 Pressure Injury Buttocks Left Stage 2 -  Partial thickness loss of dermis presenting as a shallow open injury with a red, pink wound bed without slough. (Active)    Estimated body mass index is 37.95 kg/m as calculated from the following:   Height as of this encounter: 5' 1.5 (1.562 m).   Weight as of this encounter: 92.6 kg.  DVT prophylaxis: None due to severe thrombocytopenia Code Status: Full code  family Communication: None at bedside Disposition Plan:  Status is: Inpatient   Consultants: Oncology, neurosurgery, pulmonary  Procedures: MRI and CT scan of the chest MRI of the thoracic spine Antimicrobials:-Doxycycline  Subjective:  Resting in bed reports  she feels better she is still thinking about thoracentesis still thinking about radiation to thoracic spine she wants to talk to family about it they have not made a decision  Objective: Vitals:   11/08/24 2134 11/08/24 2213 11/09/24 0117 11/09/24 0549  BP: 137/82 (!) 143/72 (!) 157/71 133/68  Pulse: 85 85 88 78  Resp: 18 14  18   Temp: 98.3 F (36.8 C) 98.3 F (36.8 C) 98 F (36.7 C) 98.1 F (36.7 C)  TempSrc: Oral Oral Oral Oral  SpO2: 99% 100% 99% 98%  Weight:      Height:        Intake/Output Summary (Last 24 hours) at 11/09/2024 1437 Last data filed at 11/09/2024 0800 Gross per 24 hour  Intake 1726.89 ml  Output 2650 ml  Net -923.11 ml   Filed Weights   11/07/24 2308  Weight: 92.6 kg    Examination:  General exam: Appears weak and chronically ill-appearing Respiratory system: Diminished breath sounds at the bases respiratory effort normal. Cardiovascular system: reg Gastrointestinal system: Abdomen is nondistended, soft and nontender. No organomegaly or masses felt. Normal bowel sounds heard. Central nervous system: Alert and oriented.  Sensation in the lower extremities intact able to move legs up and down moves all extremities. Extremities: No edema    Data Reviewed: I have personally reviewed following labs and imaging studies  CBC: Recent Labs  Lab  11/03/24 0557 11/05/24 0547 11/07/24 1643 11/08/24 0557 11/09/24 0606  WBC 6.8 7.8 12.3* 9.6 11.7*  NEUTROABS  --   --  4.9  --   --   HGB 8.4* 8.9* 8.3* 7.2* 8.4*  HCT 26.2* 28.4* 26.6* 23.4* 26.7*  MCV 87.9 88.5 88.7 89.3 87.3  PLT 16* 19* 18* 15* 15*   Basic Metabolic Panel: Recent Labs  Lab 11/03/24 0557 11/05/24 1036 11/07/24 1643 11/08/24 0557 11/09/24 0606  NA 138 136 140 141 138  K 3.8 4.8 3.3* 2.9* 4.2  CL 103 104 102 104 101  CO2 25 22 27 28 28   GLUCOSE 92 125* 110* 97 201*  BUN 12 11 12 12 15   CREATININE 0.44 0.46 0.59 0.58 0.52  CALCIUM  9.6 10.1 11.4* 10.8* 10.7*  MG 1.8  --   --   1.5*  --   PHOS 2.5  --   --   --   --    GFR: Estimated Creatinine Clearance: 80.3 mL/min (by C-G formula based on SCr of 0.52 mg/dL). Liver Function Tests: Recent Labs  Lab 11/03/24 0557 11/08/24 0557 11/09/24 0606  AST 183* 48* 88*  ALT 8 8 9   ALKPHOS 246* 186* 189*  BILITOT 0.8 0.5 0.4  PROT 7.2 7.0 7.4  ALBUMIN 2.5* 2.6* 2.9*   No results for input(s): LIPASE, AMYLASE in the last 168 hours. No results for input(s): AMMONIA in the last 168 hours. Coagulation Profile: No results for input(s): INR, PROTIME in the last 168 hours. Cardiac Enzymes: No results for input(s): CKTOTAL, CKMB, CKMBINDEX, TROPONINI in the last 168 hours. BNP (last 3 results) Recent Labs    11/07/24 1643  PROBNP 2,001.0*   HbA1C: No results for input(s): HGBA1C in the last 72 hours. CBG: No results for input(s): GLUCAP in the last 168 hours. Lipid Profile: No results for input(s): CHOL, HDL, LDLCALC, TRIG, CHOLHDL, LDLDIRECT in the last 72 hours. Thyroid  Function Tests: Recent Labs    11/08/24 0557  TSH 2.920   Anemia Panel: No results for input(s): VITAMINB12, FOLATE, FERRITIN, TIBC, IRON, RETICCTPCT in the last 72 hours. Sepsis Labs: No results for input(s): PROCALCITON, LATICACIDVEN in the last 168 hours.  Recent Results (from the past 240 hours)  Resp panel by RT-PCR (RSV, Flu A&B, Covid) Anterior Nasal Swab     Status: None   Collection Time: 11/07/24  5:00 PM   Specimen: Anterior Nasal Swab  Result Value Ref Range Status   SARS Coronavirus 2 by RT PCR NEGATIVE NEGATIVE Final    Comment: (NOTE) SARS-CoV-2 target nucleic acids are NOT DETECTED.  The SARS-CoV-2 RNA is generally detectable in upper respiratory specimens during the acute phase of infection. The lowest concentration of SARS-CoV-2 viral copies this assay can detect is 138 copies/mL. A negative result does not preclude SARS-Cov-2 infection and should not be used as the  sole basis for treatment or other patient management decisions. A negative result may occur with  improper specimen collection/handling, submission of specimen other than nasopharyngeal swab, presence of viral mutation(s) within the areas targeted by this assay, and inadequate number of viral copies(<138 copies/mL). A negative result must be combined with clinical observations, patient history, and epidemiological information. The expected result is Negative.  Fact Sheet for Patients:  bloggercourse.com  Fact Sheet for Healthcare Providers:  seriousbroker.it  This test is no t yet approved or cleared by the [place]  FDA and  has been authorized for detection and/or diagnosis of SARS-CoV-2 by FDA under an Emergency  Use Authorization (EUA). This EUA will remain  in effect (meaning this test can be used) for the duration of the COVID-19 declaration under Section 564(b)(1) of the Act, 21 U.S.C.section 360bbb-3(b)(1), unless the authorization is terminated  or revoked sooner.       Influenza A by PCR NEGATIVE NEGATIVE Final   Influenza B by PCR NEGATIVE NEGATIVE Final    Comment: (NOTE) The Xpert Xpress SARS-CoV-2/FLU/RSV plus assay is intended as an aid in the diagnosis of influenza from Nasopharyngeal swab specimens and should not be used as a sole basis for treatment. Nasal washings and aspirates are unacceptable for Xpert Xpress SARS-CoV-2/FLU/RSV testing.  Fact Sheet for Patients: bloggercourse.com  Fact Sheet for Healthcare Providers: seriousbroker.it  This test is not yet approved or cleared by the [place]  FDA and has been authorized for detection and/or diagnosis of SARS-CoV-2 by FDA under an Emergency Use Authorization (EUA). This EUA will remain in effect (meaning this test can be used) for the duration of the COVID-19 declaration under Section 564(b)(1) of the  Act, 21 U.S.C. section 360bbb-3(b)(1), unless the authorization is terminated or revoked.     Resp Syncytial Virus by PCR NEGATIVE NEGATIVE Final    Comment: (NOTE) Fact Sheet for Patients: bloggercourse.com  Fact Sheet for Healthcare Providers: seriousbroker.it  This test is not yet approved or cleared by the [place]  FDA and has been authorized for detection and/or diagnosis of SARS-CoV-2 by FDA under an Emergency Use Authorization (EUA). This EUA will remain in effect (meaning this test can be used) for the duration of the COVID-19 declaration under Section 564(b)(1) of the Act, 21 U.S.C. section 360bbb-3(b)(1), unless the authorization is terminated or revoked.  Performed at [facility], [address] [place], [place] [address]          Radiology Studies: MR THORACIC SPINE W WO CONTRAST Result Date: 11/08/2024 EXAM: MRI THORACIC SPINE WITH AND WITHOUT INTRAVENOUS CONTRAST 11/08/2024 12:08:51 PM TECHNIQUE: Multiplanar multisequence MRI of the thoracic spine was performed with and without the administration of 10 mL gadobutrol  (GADAVIST ) 1 MMOL/ML injection. COMPARISON: CT of the chest dated 11/07/2024. CLINICAL HISTORY: Spine metastases, thoracic, monitor. FINDINGS: BONES AND ALIGNMENT: Normal alignment. The thoracic vertebrae are all diffusely reduced in T1 signal intensity and/or heterogeneous in signal intensity on T2. There is a vertebral planar deformity of T6 again demonstrated. There are pathological fractures of T3 and T4. There is a downward bone deformity of the superior endplate of T12. The vertebral bodies also demonstrate heterogeneous contrast enhancement. The findings are highly suggestive of widespread metastatic disease involving the thoracic spine, epidural space, and paraspinous regions, with associated pathological fractures, vertebral deformities, and spinal canal stenosis. Follow-up  imaging, potentially with a dedicated whole-body bone scan or PET/CT, may be considered for further assessment of disease extent. Clinical correlation with known primary malignancy is essential. SPINAL CORD: Normal spinal cord volume. Normal spinal cord signal. There are no definite spinal cord lesions. SOFT TISSUES: There is extensive enhancing epidural and paraspinous metastatic disease, which is resulting in multilevel spinal canal stenosis. There are mild bilateral pleural effusions. DEGENERATIVE CHANGES: No significant disc herniation. At T6, there is posterior bowing of the posterior wall of the vertebral body, which abuts the ventral surface of the spinal cord. There is multilevel spinal canal stenosis resulting from extensive enhancing epidural and paraspinous metastatic disease. IMPRESSION: 1. Extensive enhancing epidural and paraspinous disease with diffuse abnormal/enhancing thoracic marrow signal resulting in multilevel spinal canal stenosis; at T6 there is  posterior vertebral body bowing abutting the ventral cord without definite intramedullary cord lesion. Overall appearance is consistent with multiple myeloma. Recommend urgent neurosurgical and/or radiation oncology consultation to assess for impending cord compression. 2. Pathological fractures of T3 and T4, vertebral planar deformity of T6, and downward deformity of the superior endplate of T12. 3. Mild bilateral pleural effusions. Electronically signed by: Evalene Coho MD 11/08/2024 12:44 PM EST RP Workstation: [id number]   CT CHEST WO CONTRAST Result Date: 11/07/2024 EXAM: CT CHEST WITHOUT CONTRAST 11/07/2024 07:57:35 PM TECHNIQUE: CT of the chest was performed without the administration of intravenous contrast. Multiplanar reformatted images are provided for review. Automated exposure control, iterative reconstruction, and/or weight based adjustment of the mA/kV was utilized to reduce the radiation dose to as low as reasonably achievable.  COMPARISON: Portable chest films from 10/31/2024 and 10/26/2024, full body PET CT 02/12/2024, and CTA chest 10/31/2016. CLINICAL HISTORY: Pleural effusion, malignancy suspected. Patient has a history of multiple myeloma. FINDINGS: MEDIASTINUM: Heart is moderately enlarged. There is no pericardial effusion. There are calcifications in the left main and proximal LAD coronary artery. The cardiac blood pool is hypodense consistent with anemia. The pulmonary trunk is prominent measuring 3.9 cm indicating arterial hypertension (previously 3.8 cm). There is mild prominence of the central veins. Mild aortic atherosclerosis without aneurysm. The great vessels branched normally. There is lobulated substernal soft tissue thickening in the mediastinum which is most likely related to myelomatous involvement of sternum with multiple lytic sternal lesions noted, some with cortical breakthrough posteriorly. The trachea and main bronchi are clear. Central airways are small in caliber, which could be due to active breathing or bronchospasm. LYMPH NODES: There are shotty mediastinal lymph nodes in the AP window, prevascular and subcarinal spaces up to 10 mm. Hilar adenopathy is not well evaluated without contrast. No axillary adenopathy is seen. LUNGS AND PLEURA: There are moderate-sized left greater than right pleural effusions. Some of the left pleural fluid could be loculated anterolaterally. The pleural effusions collapse and obscure the lower lobes on the left greater than right. There is posterior atelectasis in the upper and right middle lobes along the fissures. Linear atelectasis in the bases. There is multifocal lobular pleural thickening in both hemithoraces probably also due to myelomatous involvement affecting the rib cage, otherwise could be other pleural metastases. No pneumothorax. SOFT TISSUES/BONES: There is a 6 mm hypodense nodule in the left lobe of the thyroid . No follow-up imaging is recommended. Generalized  edema of the chest wall. There are extensive lytic lesions present throughout the spine, sternum, ribs, and several in the scapulae. There are multiple fractures in various stages of healing of the bilateral ribs. There is a severe age indeterminate pathologic compression fracture of the T6 vertebral body and a moderate compression fracture of T4, at T6 with vertebra plana, retropulsion and soft tissue extending into the spinal canal, narrowing the thecal sac to 7 mm AP with likely mild compressive effect on the thoracic cord. . There is a mild upper plate pathologic wedge compression fracture of the L2 vertebral body as well. UPPER ABDOMEN: Limited images of the upper abdomen demonstrate suspected multiple liver masses, not well seen due to beam hardening artifact. There is a nodular surface contour to the liver, which was also not seen previously. . On the last image, there is a vague 2.7 cm hypodense mass in segment 4, and a suspected 1.4 cm periligamentous mass in this segment also in the last image. There are probably other masses obscured by beam  hardening. Further evaluation is recommended. IMPRESSION: 1. Moderate-sized left greater than right pleural effusions, with possible partial loculation of the left effusion, and associated multifocal lobular pleural thickening, likely related to myelomatous involvement or other pleural metastases, new since 02/12/2024. 2. Extensive osseous lytic disease with multiple bilateral rib fractures in various stages of healing , and age indeterminate . severe pathologic compression fracture of T6 with vertebra plana, retropulsion, and soft tissue extension into the spinal canal with likely mild thoracic cord compression; additional moderate T4 and mild L2 pathologic compression fractures are present, and thoracic spine MRI is recommended. 3. Suspected multiple liver masses, suboptimally evaluated due to beam hardening artifact, and contrast-enhanced CT or MRI of the abdomen  is recommended for further evaluation. 4. The pleural effusions collapse and obscure the lower lobes on the left greater than right. Underlying pneumonia or mass would be difficult to exclude. 5. Cardiomegaly with enlarged pulmonary trunk , prominent central veins , edema of the body wall. No pulmonary edema is seen. 6. Aortic and coronary artery atherosclerosis. Electronically signed by: Francis Quam MD 11/07/2024 08:40 PM EST RP Workstation: [id number]   DG Chest Port 1 View Result Date: 11/07/2024 CLINICAL DATA:  Shortness of breath EXAM: PORTABLE CHEST 1 VIEW COMPARISON:  10/31/2024, PET CT 02/12/2024, chest x-ray 09/08/2024 FINDINGS: Cardiomegaly with vascular congestion. Small right and moderate left pleural effusions, increased compared to prior. Worsened airspace disease at both bases. Aortic atherosclerosis. No pneumothorax IMPRESSION: Cardiomegaly with vascular congestion. Small right and moderate left pleural effusions, increased compared to prior. Worsened airspace disease at both bases may be due to atelectasis or pneumonia. Electronically Signed   By: Luke Bun M.D.   On: 11/07/2024 15:54   Scheduled Meds:  acyclovir   400 mg Oral BID   amiodarone   200 mg Oral BID   calcitonin (salmon)  1 spray Alternating Nares Daily   dexamethasone   10 mg Oral Q12H   diltiazem   240 mg Oral Daily   doxycycline   100 mg Oral Q12H   ferrous sulfate   325 mg Oral Q breakfast   fluticasone  furoate-vilanterol  1 puff Inhalation Daily   folic acid   1 mg Oral BID   thiamine   100 mg Oral Daily   Continuous Infusions:  cefTRIAXone  (ROCEPHIN )  IV Stopped (11/08/24 1744)     LOS: 2 days   Almarie KANDICE Hoots, MD  11/09/2024, 2:37 PM   "

## 2024-11-09 NOTE — Progress Notes (Addendum)
 " Radiation Oncology         [phone] ________________________________  Initial Inpatient Consultation  Name: Carla Hanson MRN: [id number]  Date: 11/07/2024  DOB: 1966/06/14  RR:Zwpnoj, Otto DASEN, MD  No ref. provider found   REFERRING PHYSICIAN: Dr. Onesimo  DIAGNOSIS: The primary encounter diagnosis was Multiple myeloma not having achieved remission (HCC). A diagnosis of Compression fracture of T6 vertebra, initial encounter [facility]) was also pertinent to this visit.  HISTORY OF PRESENT ILLNESS: Carla Hanson is a 59 y.o. female with a medical history significant for refractory IgG lambda multiple myeloma, who is being seen at the request of Dr. Onesimo for T6 pathologic compression fracture.   Patient is status post multiple lines of chemotherapy under the care of Dr. Onesimo. She was in the process of being referred to [facility] for consideration of BITE therapy.   She was recently admitted from 10/20/2025 - 11/05/2024 for symptomatic anemia that required at least 5 units of PRBC transfusion. During her stay, she also developed new onset A-fib and was placed on amiodarone  and Cardizem . She was discharged with plans to follow-up with oncology and PCP.  Most recently, she presented to the ED on 11/07/2024 with complaints of shortness of breath. CT of the chest on 11/07/2024 demonstrated moderate-sized bilateral pleural effusions with associated multifocal lobular pleural thickening; extensive osseous lytic disease with multiple bilateral rib fractures and severe pathologic compression fracture of T6 with likely mild thoracic cord compression; suspected liver metastases; cardiomegaly; and aortic and coronary artery atherosclerosis.  Subsequent MRI of the thoracic spine on 11/08/2024 showed extensive enhancing epidural and paraspinous disease with diffuse abnormal thoracic marrow signal resulting in multilevel level spinal canal stenosis, with a posterior vertebral body bowing abutting the ventral cord  at T6; pathologic fractures of T3 and T4, vertebral plana deformity of T6, and downward deformity of the superior endplate of T12.   She was seen by neurosurgeon, Dr. Mavis, and not deemed to be a surgical candidate.  She was kindly referred to us  today to discuss radiation treatment options.  She was seen at bedside today.  Patient notes chronic intermittent lower back pain.  She denies any spinal pain, or pain elsewhere.  She notes difficulty with ambulation due to severe shortness of breath, but denies any extremity weakness, numbness, or tingling.  She denies any bowel or bladder incontinence.  She denies any saddle anesthesia.  She denies any nausea, constipation, or diarrhea.   PREVIOUS RADIATION THERAPY: No  PAST MEDICAL HISTORY:  has a past medical history of Alcohol  abuse, Anxiety, Back pain, Bipolar disorder (HCC), Bronchitis, COPD exacerbation (HCC) (05/10/2016), Drug use, Edema (02/24/2024), History of blood clots, Hyperlipidemia, Hypertension, Influenza A (12/01/2018), Joint pain, Left leg DVT (HCC) (09/29/2013), Lipoma, LIPOMA (01/20/2008), Obesity, Occasional tremors (05/12/2019), Osteoarthritis of right knee, Other fatigue, Painful menstrual periods (01/05/2012), Pneumonia, Prediabetes, Seizure (HCC), Shortness of breath on exertion, Stroke (HCC), and Tobacco abuse.    PAST SURGICAL HISTORY: Past Surgical History:  Procedure Laterality Date   CESAREAN SECTION     CYSTECTOMY     IR BONE MARROW BIOPSY & ASPIRATION  10/27/2024   LIPOMA EXCISION  03/2011   ORIF ANKLE FRACTURE  03/13/2012   Procedure: OPEN REDUCTION INTERNAL FIXATION (ORIF) ANKLE FRACTURE;  Surgeon: Lynwood FORBES Better, MD;  Location: [facility];  Service: Orthopedics;  Laterality: Right;   TUBAL LIGATION      FAMILY HISTORY: family history includes Asthma in her father; Diabetes in her maternal aunt; Heart attack  in her father; Obesity in her father and mother; Other in her mother.  SOCIAL HISTORY:  reports that she quit  smoking about 8 years ago. Her smoking use included cigarettes. She started smoking about 43 years ago. She has a 17.5 pack-year smoking history. She has never used smokeless tobacco. She reports current drug use. Drug: Marijuana. She reports that she does not drink alcohol .  ALLERGIES: Latuda  [lurasidone ]  MEDICATIONS:  Current Facility-Administered Medications  Medication Dose Route Frequency Provider Last Rate Last Admin   acetaminophen  (TYLENOL ) tablet 650 mg  650 mg Oral Q6H PRN Franky Redia SAILOR, MD       Or   acetaminophen  (TYLENOL ) suppository 650 mg  650 mg Rectal Q6H PRN Franky Redia SAILOR, MD       acyclovir  (ZOVIRAX ) tablet 400 mg  400 mg Oral BID Franky Redia SAILOR, MD   400 mg at 11/09/24 1000   amiodarone  (PACERONE ) tablet 200 mg  200 mg Oral BID Franky Redia SAILOR, MD   200 mg at 11/09/24 1000   calcitonin (salmon) (MIACALCIN /FORTICAL) nasal spray 1 spray  1 spray Alternating Nares Daily Franky Redia SAILOR, MD   1 spray at 11/09/24 1001   cefTRIAXone  (ROCEPHIN ) 2 g in sodium chloride  0.9 % 100 mL IVPB  2 g Intravenous Q24H Kakrakandy, Arshad N, MD   Stopped at 11/08/24 1744   dexamethasone  (DECADRON ) tablet 10 mg  10 mg Oral Q12H Mathews, Elizabeth G, MD   10 mg at 11/09/24 1000   diltiazem  (CARDIZEM  CD) 24 hr capsule 240 mg  240 mg Oral Daily Kakrakandy, Arshad N, MD   240 mg at 11/09/24 1001   doxycycline  (VIBRA -TABS) tablet 100 mg  100 mg Oral Q12H Will Almarie MATSU, MD   100 mg at 11/09/24 1001   ferrous sulfate  tablet 325 mg  325 mg Oral Q breakfast Kakrakandy, Arshad N, MD   325 mg at 11/09/24 1001   fluticasone  furoate-vilanterol (BREO ELLIPTA ) 100-25 MCG/ACT 1 puff  1 puff Inhalation Daily Franky Redia SAILOR, MD   1 puff at 11/09/24 1002   folic acid  (FOLVITE ) tablet 1 mg  1 mg Oral BID Kakrakandy, Arshad N, MD   1 mg at 11/09/24 1000   HYDROmorphone  (DILAUDID ) injection 1 mg  1 mg Intravenous Q4H PRN Mathews, Elizabeth G, MD   1 mg at 11/08/24 2112    thiamine  (VITAMIN B1) tablet 100 mg  100 mg Oral Daily Carolee Browning T, RPH   100 mg at 11/09/24 1000    REVIEW OF SYSTEMS: Notable for that above.   PHYSICAL EXAM:  height is 5' 1.5 (1.562 m) and weight is 204 lb 2.3 oz (92.6 kg). Her oral temperature is 98.1 F (36.7 C). Her blood pressure is 133/68 and her pulse is 78. Her respiration is 18 and oxygen saturation is 98%.    General: Alert and oriented, in no acute distress HEENT: Head is normocephalic. Extraocular movements are intact. Neck: Neck is supple. Heart: Regular in rate and rhythm with no murmurs, rubs, or gallops. Chest: Clear to auscultation bilaterally, with decreased breath sounds appreciated at the right lung base. Abdomen: Soft, nontender, nondistended, with no rigidity or guarding. Extremities: No pedal edema. Lymphatics: SCDs in-place.  Skin: No concerning lesions. Musculoskeletal: 5/5 strength dorsiflexion, plantar flexion, gastrocnemius, handgrip, and biceps.  Neurologic: Cranial nerves II through XII are grossly intact. No obvious focalities. Speech is fluent. Coordination is intact. Sensory function to light touch is grossly intact.  Psychiatric: Judgment and insight are intact.  Affect is appropriate.     ECOG = 3  0 - Asymptomatic (Fully active, able to carry on all predisease activities without restriction)  1 - Symptomatic but completely ambulatory (Restricted in physically strenuous activity but ambulatory and able to carry out work of a light or sedentary nature. For example, light housework, office work)  2 - Symptomatic, <50% in bed during the day (Ambulatory and capable of all self care but unable to carry out any work activities. Up and about more than 50% of waking hours)  3 - Symptomatic, >50% in bed, but not bedbound (Capable of only limited self-care, confined to bed or chair 50% or more of waking hours)  4 - Bedbound (Completely disabled. Cannot carry on any self-care. Totally confined to bed  or chair)  5 - Death   Raylene MM, Creech RH, Tormey DC, et al. [phone]). Toxicity and response criteria of the [facility]. Am. DOROTHA Bridges. Oncol. 5 (6): 649-55  LABORATORY DATA:  Lab Results  Component Value Date   WBC 11.7 (H) 11/09/2024   HGB 8.4 (L) 11/09/2024   HCT 26.7 (L) 11/09/2024   MCV 87.3 11/09/2024   PLT 15 (LL) 11/09/2024   NEUTROABS 4.9 11/07/2024   Lab Results  Component Value Date   NA 138 11/09/2024   K 4.2 11/09/2024   CL 101 11/09/2024   CO2 28 11/09/2024   GLUCOSE 201 (H) 11/09/2024   BUN 15 11/09/2024   CREATININE 0.52 11/09/2024   CALCIUM  10.7 (H) 11/09/2024      RADIOGRAPHY: MR THORACIC SPINE W WO CONTRAST Result Date: 11/08/2024 EXAM: MRI THORACIC SPINE WITH AND WITHOUT INTRAVENOUS CONTRAST 11/08/2024 12:08:51 PM TECHNIQUE: Multiplanar multisequence MRI of the thoracic spine was performed with and without the administration of 10 mL gadobutrol  (GADAVIST ) 1 MMOL/ML injection. COMPARISON: CT of the chest dated 11/07/2024. CLINICAL HISTORY: Spine metastases, thoracic, monitor. FINDINGS: BONES AND ALIGNMENT: Normal alignment. The thoracic vertebrae are all diffusely reduced in T1 signal intensity and/or heterogeneous in signal intensity on T2. There is a vertebral planar deformity of T6 again demonstrated. There are pathological fractures of T3 and T4. There is a downward bone deformity of the superior endplate of T12. The vertebral bodies also demonstrate heterogeneous contrast enhancement. The findings are highly suggestive of widespread metastatic disease involving the thoracic spine, epidural space, and paraspinous regions, with associated pathological fractures, vertebral deformities, and spinal canal stenosis. Follow-up imaging, potentially with a dedicated whole-body bone scan or PET/CT, may be considered for further assessment of disease extent. Clinical correlation with known primary malignancy is essential. SPINAL CORD: Normal spinal cord  volume. Normal spinal cord signal. There are no definite spinal cord lesions. SOFT TISSUES: There is extensive enhancing epidural and paraspinous metastatic disease, which is resulting in multilevel spinal canal stenosis. There are mild bilateral pleural effusions. DEGENERATIVE CHANGES: No significant disc herniation. At T6, there is posterior bowing of the posterior wall of the vertebral body, which abuts the ventral surface of the spinal cord. There is multilevel spinal canal stenosis resulting from extensive enhancing epidural and paraspinous metastatic disease. IMPRESSION: 1. Extensive enhancing epidural and paraspinous disease with diffuse abnormal/enhancing thoracic marrow signal resulting in multilevel spinal canal stenosis; at T6 there is posterior vertebral body bowing abutting the ventral cord without definite intramedullary cord lesion. Overall appearance is consistent with multiple myeloma. Recommend urgent neurosurgical and/or radiation oncology consultation to assess for impending cord compression. 2. Pathological fractures of T3 and T4, vertebral planar deformity of T6, and downward  deformity of the superior endplate of T12. 3. Mild bilateral pleural effusions. Electronically signed by: Evalene Coho MD 11/08/2024 12:44 PM EST RP Workstation: [id number]   CT CHEST WO CONTRAST Result Date: 11/07/2024 EXAM: CT CHEST WITHOUT CONTRAST 11/07/2024 07:57:35 PM TECHNIQUE: CT of the chest was performed without the administration of intravenous contrast. Multiplanar reformatted images are provided for review. Automated exposure control, iterative reconstruction, and/or weight based adjustment of the mA/kV was utilized to reduce the radiation dose to as low as reasonably achievable. COMPARISON: Portable chest films from 10/31/2024 and 10/26/2024, full body PET CT 02/12/2024, and CTA chest 10/31/2016. CLINICAL HISTORY: Pleural effusion, malignancy suspected. Patient has a history of multiple myeloma.  FINDINGS: MEDIASTINUM: Heart is moderately enlarged. There is no pericardial effusion. There are calcifications in the left main and proximal LAD coronary artery. The cardiac blood pool is hypodense consistent with anemia. The pulmonary trunk is prominent measuring 3.9 cm indicating arterial hypertension (previously 3.8 cm). There is mild prominence of the central veins. Mild aortic atherosclerosis without aneurysm. The great vessels branched normally. There is lobulated substernal soft tissue thickening in the mediastinum which is most likely related to myelomatous involvement of sternum with multiple lytic sternal lesions noted, some with cortical breakthrough posteriorly. The trachea and main bronchi are clear. Central airways are small in caliber, which could be due to active breathing or bronchospasm. LYMPH NODES: There are shotty mediastinal lymph nodes in the AP window, prevascular and subcarinal spaces up to 10 mm. Hilar adenopathy is not well evaluated without contrast. No axillary adenopathy is seen. LUNGS AND PLEURA: There are moderate-sized left greater than right pleural effusions. Some of the left pleural fluid could be loculated anterolaterally. The pleural effusions collapse and obscure the lower lobes on the left greater than right. There is posterior atelectasis in the upper and right middle lobes along the fissures. Linear atelectasis in the bases. There is multifocal lobular pleural thickening in both hemithoraces probably also due to myelomatous involvement affecting the rib cage, otherwise could be other pleural metastases. No pneumothorax. SOFT TISSUES/BONES: There is a 6 mm hypodense nodule in the left lobe of the thyroid . No follow-up imaging is recommended. Generalized edema of the chest wall. There are extensive lytic lesions present throughout the spine, sternum, ribs, and several in the scapulae. There are multiple fractures in various stages of healing of the bilateral ribs. There is a  severe age indeterminate pathologic compression fracture of the T6 vertebral body and a moderate compression fracture of T4, at T6 with vertebra plana, retropulsion and soft tissue extending into the spinal canal, narrowing the thecal sac to 7 mm AP with likely mild compressive effect on the thoracic cord. . There is a mild upper plate pathologic wedge compression fracture of the L2 vertebral body as well. UPPER ABDOMEN: Limited images of the upper abdomen demonstrate suspected multiple liver masses, not well seen due to beam hardening artifact. There is a nodular surface contour to the liver, which was also not seen previously. . On the last image, there is a vague 2.7 cm hypodense mass in segment 4, and a suspected 1.4 cm periligamentous mass in this segment also in the last image. There are probably other masses obscured by beam hardening. Further evaluation is recommended. IMPRESSION: 1. Moderate-sized left greater than right pleural effusions, with possible partial loculation of the left effusion, and associated multifocal lobular pleural thickening, likely related to myelomatous involvement or other pleural metastases, new since 02/12/2024. 2. Extensive osseous lytic disease with multiple  bilateral rib fractures in various stages of healing , and age indeterminate . severe pathologic compression fracture of T6 with vertebra plana, retropulsion, and soft tissue extension into the spinal canal with likely mild thoracic cord compression; additional moderate T4 and mild L2 pathologic compression fractures are present, and thoracic spine MRI is recommended. 3. Suspected multiple liver masses, suboptimally evaluated due to beam hardening artifact, and contrast-enhanced CT or MRI of the abdomen is recommended for further evaluation. 4. The pleural effusions collapse and obscure the lower lobes on the left greater than right. Underlying pneumonia or mass would be difficult to exclude. 5. Cardiomegaly with enlarged  pulmonary trunk , prominent central veins , edema of the body wall. No pulmonary edema is seen. 6. Aortic and coronary artery atherosclerosis. Electronically signed by: Francis Quam MD 11/07/2024 08:40 PM EST RP Workstation: [id number]   DG Chest Port 1 View Result Date: 11/07/2024 CLINICAL DATA:  Shortness of breath EXAM: PORTABLE CHEST 1 VIEW COMPARISON:  10/31/2024, PET CT 02/12/2024, chest x-ray 09/08/2024 FINDINGS: Cardiomegaly with vascular congestion. Small right and moderate left pleural effusions, increased compared to prior. Worsened airspace disease at both bases. Aortic atherosclerosis. No pneumothorax IMPRESSION: Cardiomegaly with vascular congestion. Small right and moderate left pleural effusions, increased compared to prior. Worsened airspace disease at both bases may be due to atelectasis or pneumonia. Electronically Signed   By: Luke Bun M.D.   On: 11/07/2024 15:54   DG CHEST PORT 1 VIEW Result Date: 10/31/2024 CLINICAL DATA:  Pleural effusion.  Multiple myeloma. EXAM: PORTABLE CHEST 1 VIEW COMPARISON:  10/26/2024 FINDINGS: Stable cardiomegaly and pulmonary vascular congestion. Stable mild pleural thickening or tiny pleural effusions. No significant change in left lower lobe atelectasis or scarring. No new or worsening areas of pulmonary opacity are seen. IMPRESSION: Stable cardiomegaly and pulmonary vascular congestion. Stable left lower lobe atelectasis or scarring. Stable tiny bilateral pleural effusions versus pleural thickening. Electronically Signed   By: Norleen DELENA Kil M.D.   On: 10/31/2024 09:54   ECHOCARDIOGRAM COMPLETE Result Date: 10/30/2024    ECHOCARDIOGRAM REPORT   Patient Name:   JILLIAM BELLMORE [facility] Date of Exam: 10/30/2024 Medical Rec #:  [id number]      Height:       61.5 in Accession #:    [id number]     Weight:       204.1 lb Date of Birth:  Mar 12, 1966     BSA:          1.917 m Patient Age:    58 years       BP:           126/70 mmHg Patient Gender: F              HR:            86 bpm. Exam Location:  Inpatient Procedure: 2D Echo, Cardiac Doppler, Color Doppler and Intracardiac            Opacification Agent (Both Spectral and Color Flow Doppler were            utilized during procedure). Indications:    Atrial Flutter  History:        Patient has prior history of Echocardiogram examinations, most                 recent 12/05/2021.  Sonographer:    Philomena Daring Referring Phys: [id number] JOETTE PEBBLES IMPRESSIONS  1. Left ventricular ejection fraction, by estimation, is 60 to 65%. The left ventricle has normal function.  The left ventricle has no regional wall motion abnormalities. Left ventricular diastolic parameters are indeterminate.  2. Right ventricular systolic function is normal. The right ventricular size is mildly enlarged. There is mildly elevated pulmonary artery systolic pressure. The estimated right ventricular systolic pressure is 39.9 mmHg.  3. Large pleural effusion in the left lateral region.  4. The mitral valve was not well visualized. No evidence of mitral valve regurgitation.  5. The aortic valve is tricuspid. Aortic valve regurgitation is not visualized. No aortic stenosis is present.  6. The inferior vena cava is dilated in size with <50% respiratory variability, suggesting right atrial pressure of 15 mmHg. Comparison(s): Prior images reviewed side by side. 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 FINDINGS  Left Ventricle: Left ventricular ejection fraction, by estimation, is 60 to 65%. The left ventricle has normal function. The left ventricle has no regional wall motion abnormalities. The left ventricular internal cavity size was normal in size. There is  no left ventricular hypertrophy. Left ventricular diastolic parameters are indeterminate. Right Ventricle: The right ventricular size is mildly enlarged. No increase in right ventricular wall thickness. Right ventricular systolic function is normal. There is mildly elevated pulmonary artery systolic  pressure. The tricuspid regurgitant velocity is 2.23 m/s, and with an assumed right atrial pressure of 20 mmHg, the estimated right ventricular systolic pressure is 39.9 mmHg. Left Atrium: Left atrial size was normal in size. Right Atrium: Right atrial size was normal in size. Pericardium: There is no evidence of pericardial effusion. Mitral Valve: The mitral valve was not well visualized. No evidence of mitral valve regurgitation. Tricuspid Valve: The tricuspid valve is normal in structure. Tricuspid valve regurgitation is trivial. No evidence of tricuspid stenosis. Aortic Valve: The aortic valve is tricuspid. Aortic valve regurgitation is not visualized. No aortic stenosis is present. Aortic valve mean gradient measures 9.0 mmHg. Aortic valve peak gradient measures 18.6 mmHg. Aortic valve area, by VTI measures 2.62  cm. Pulmonic Valve: The pulmonic valve was normal in structure. Pulmonic valve regurgitation is trivial. No evidence of pulmonic stenosis. Aorta: The aortic root and ascending aorta are structurally normal, with no evidence of dilitation. Venous: The inferior vena cava is dilated in size with less than 50% respiratory variability, suggesting right atrial pressure of 15 mmHg. IAS/Shunts: No atrial level shunt detected by color flow Doppler. Additional Comments: There is a large pleural effusion in the left lateral region.  LEFT VENTRICLE PLAX 2D LVIDd:         5.00 cm   Diastology LVIDs:         3.40 cm   LV e' medial:    7.72 cm/s LV PW:         1.00 cm   LV E/e' medial:  12.9 LV IVS:        0.90 cm   LV e' lateral:   11.00 cm/s LVOT diam:     2.00 cm   LV E/e' lateral: 9.1 LV SV:         87 LV SV Index:   45 LVOT Area:     3.14 cm  RIGHT VENTRICLE             IVC RV S prime:     20.40 cm/s  IVC diam: 2.60 cm TAPSE (M-mode): 3.2 cm LEFT ATRIUM             Index        RIGHT ATRIUM  Index LA Vol (A2C):   53.7 ml 28.01 ml/m  RA Area:     17.00 cm LA Vol (A4C):   34.2 ml 17.84 ml/m  RA  Volume:   39.90 ml  20.82 ml/m LA Biplane Vol: 43.6 ml 22.75 ml/m  AORTIC VALVE AV Area (Vmax):    2.51 cm AV Area (Vmean):   2.50 cm AV Area (VTI):     2.62 cm AV Vmax:           215.50 cm/s AV Vmean:          137.500 cm/s AV VTI:            0.333 m AV Peak Grad:      18.6 mmHg AV Mean Grad:      9.0 mmHg LVOT Vmax:         172.00 cm/s LVOT Vmean:        109.500 cm/s LVOT VTI:          0.278 m LVOT/AV VTI ratio: 0.83  AORTA Ao Root diam: 3.20 cm Ao Asc diam:  3.00 cm MITRAL VALVE                TRICUSPID VALVE MV Area (PHT): 4.71 cm     TR Peak grad:   19.9 mmHg MV Decel Time: 161 msec     TR Vmax:        223.00 cm/s MV E velocity: 99.80 cm/s MV A velocity: 101.00 cm/s  SHUNTS MV E/A ratio:  0.99         Systemic VTI:  0.28 m                             Systemic Diam: 2.00 cm Stanly Leavens MD Electronically signed by Stanly Leavens MD Signature Date/Time: 10/30/2024/3:14:55 PM    Final    IR BONE MARROW BIOPSY & ASPIRATION Result Date: 10/27/2024 INDICATION: Myeloma, reassess marrow status EXAM: FLUOROSCOPICALLY GUIDED RIGHT ILIAC BONE MARROW ASPIRATION AND CORE BIOPSY Date:  10/27/2024 10/27/2024 10:19 am Radiologist:  CHRISTELLA. Frederic Specking, MD Guidance: Fluoroscopy FLUOROSCOPY: Fluoroscopy Time:  (56 mGy). MEDICATIONS: 1% lidocaine  local ANESTHESIA/SEDATION: 1.5 mg IV Versed ; 100 mcg IV Fentanyl  Moderate Sedation Time:  11 minutes The patient was continuously monitored during the procedure by the interventional radiology nurse under my direct supervision. CONTRAST:  None. COMPLICATIONS: None immediate PROCEDURE: Informed consent was obtained from the patient following explanation of the procedure, risks, benefits and alternatives. The patient understands, agrees and consents for the procedure. All questions were addressed. A time out was performed. The patient was positioned prone and fluoroscopic localization was performed of the pelvis to demonstrate the iliac marrow spaces. Maximal barrier  sterile technique utilized including caps, mask, sterile gowns, sterile gloves, large sterile drape, hand hygiene, and Betadine prep. Under sterile conditions and local anesthesia, an 11 gauge coaxial bone biopsy needle was advanced into the right iliac marrow space. Needle position was confirmed with fluoroscopic imaging. Initially, bone marrow aspiration was performed. Next, the 11 gauge outer cannula was utilized to obtain a right iliac bone marrow core biopsy. Needle was removed. Hemostasis was obtained with compression. The patient tolerated the procedure well. Samples were prepared with the cytotechnologist. No immediate complications. IMPRESSION: Fluoroscopically guided right iliac bone marrow aspiration and core biopsy. Electronically Signed   By: CHRISTELLA.  Shick M.D.   On: 10/27/2024 10:31   DG CHEST PORT 1 VIEW Result Date: 10/26/2024 CLINICAL DATA:  Dyspnea.  EXAM: PORTABLE CHEST 1 VIEW COMPARISON:  09/08/2024 FINDINGS: The heart is enlarged. Mediastinal contours are stable. Vascular congestion. There are small bilateral pleural effusions. Ill-defined bilateral infrahilar opacities. No pneumothorax. IMPRESSION: 1. Cardiomegaly with vascular congestion and small bilateral pleural effusions. 2. Ill-defined bilateral infrahilar opacities, atelectasis versus pneumonia. Electronically Signed   By: Andrea Gasman M.D.   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  She presents today with a T6 pathologic compression fracture resulting in mild cord compression, consistent with her history of refractory multiple myeloma.  This was found incidentally, and she fortunately remains asymptomatic from this lesion. Dr. Shannon recommends palliative radiation to this area to reduce her risk of further cord compression. Patient is currently on 10 mg  Decadron  BID. Recommend Decadron  to 4mg  q6h.   Today, I talked to the patient about the findings and work-up thus far.  We discussed the natural history of vertebral pathologic compression fractures and general treatment, highlighting the role of radiotherapy in the management.  We discussed the available radiation techniques, and focused on the details of logistics and delivery.  We reviewed the anticipated acute and late sequelae associated with radiation in this setting.  The patient was encouraged to ask questions that I answered to the best of my ability. A patient consent form was discussed and signed.  We retained a copy for our records.   The patient is inclined to proceed with radiation however she would like to discuss with her family. Ideally we would SIM her today but she does not feel comfortable moving forward without talking to her mother and three children. She expressed a good understanding of the urgency of the situation and was educated on signs/symptoms of cord compression. She understands to notify staff immediately if she develops any of these. She was given our number to contact if she or her family has any questions or concerns.    PLAN: Patient has been scheduled for CT simulation and her first treatment on 11/11/2023. Dr. Shannon anticipates a course of 30 Gy in 10 fractions (delivered Monday-Friday) directed at T6 vertebra.   We look forward to participating in this patient's care.    I spent 60 minutes minutes face to face with the patient and more than 50% of that time was spent in counseling and/or coordination of care.   ------------------------------------------------   Leeroy Due, PA-C   Lynwood CHARM Shannon, PhD, MD   [name]  Radiation Oncology Direct Dial: [phone]  Fax: [phone] [email or web address]          "

## 2024-11-09 NOTE — Progress Notes (Signed)
 Patient ID: Carla Hanson, female   DOB: Aug 13, 1966, 59 y.o.   MRN: [id number] I reviewed the patient's thoracic MRI performed yesterday.  She has multiple myeloma.  She has a T6 fracture with some epidural spread and stenosis.  In her condition with malignant pleural effusions, severe thrombocytopenia,..., She is not a candidate for surgery.  I would suggest palliative radiation therapy and a hospice consult.  I will sign off.  Please call if I can be of further assistance.

## 2024-11-09 NOTE — Progress Notes (Signed)
 " Radiation Oncology         [phone] ________________________________  Initial Inpatient Consultation Note  Name: Carla Hanson MRN: [id number]  Date: 11/09/2024  DOB: 07/24/1966  RR:Zwpnoj, Otto DASEN, MD  Onesimo Emaline Brink, MD   REFERRING PHYSICIAN: Dr. Onesimo  DIAGNOSIS: The encounter diagnosis was Multiple myeloma not having achieved remission (HCC).  HISTORY OF PRESENT ILLNESS: Carla Hanson is a 59 y.o. female with a medical history significant for refractory IgG lambda multiple myeloma, who is being seen at the request of Dr. Onesimo for T6 pathologic compression fracture.   Patient is status post multiple lines of chemotherapy under the care of Dr. Onesimo. She was in the process of being referred to [facility] for consideration of BITE therapy.   She was recently admitted from 10/20/2025 - 11/05/2024 for symptomatic anemia that required at least 5 units of PRBC transfusion. During her stay, she also developed new onset A-fib and was placed on amiodarone  and Cardizem . She was discharged with plans to follow-up with oncology and PCP.  Most recently, she presented to the ED on 11/07/2024 with complaints of shortness of breath. CT of the chest on 11/07/2024 demonstrated moderate-sized bilateral pleural effusions with associated multifocal lobular pleural thickening; extensive osseous lytic disease with multiple bilateral rib fractures and severe pathologic compression fracture of T6 with likely mild thoracic cord compression; suspected liver metastases; cardiomegaly; and aortic and coronary artery atherosclerosis.  Subsequent MRI of the thoracic spine on 11/08/2024 showed extensive enhancing epidural and paraspinous disease with diffuse abnormal thoracic marrow signal resulting in multilevel level spinal canal stenosis, with a posterior vertebral body bowing abutting the ventral cord at T6; pathologic fractures of T3 and T4, vertebral plana deformity of T6, and downward deformity of the  superior endplate of T12.   She was seen by neurosurgeon, Dr. Mavis, and not deemed to be a surgical candidate.  She was kindly referred to us  today to discuss radiation treatment options.  She was seen at bedside today.  Patient notes chronic intermittent lower back pain.  She denies any spinal pain, or pain elsewhere.  She notes difficulty with ambulation due to severe shortness of breath, but denies any extremity weakness, numbness, or tingling.  She denies any bowel or bladder incontinence.  She denies any saddle anesthesia.  She denies any nausea, constipation, or diarrhea.   PREVIOUS RADIATION THERAPY: No  PAST MEDICAL HISTORY:  has a past medical history of Alcohol  abuse, Anxiety, Back pain, Bipolar disorder (HCC), Bronchitis, COPD exacerbation (HCC) (05/10/2016), Drug use, Edema (02/24/2024), History of blood clots, Hyperlipidemia, Hypertension, Influenza A (12/01/2018), Joint pain, Left leg DVT (HCC) (09/29/2013), Lipoma, LIPOMA (01/20/2008), Obesity, Occasional tremors (05/12/2019), Osteoarthritis of right knee, Other fatigue, Painful menstrual periods (01/05/2012), Pneumonia, Prediabetes, Seizure (HCC), Shortness of breath on exertion, Stroke (HCC), and Tobacco abuse.    PAST SURGICAL HISTORY: Past Surgical History:  Procedure Laterality Date   CESAREAN SECTION     CYSTECTOMY     IR BONE MARROW BIOPSY & ASPIRATION  10/27/2024   LIPOMA EXCISION  03/2011   ORIF ANKLE FRACTURE  03/13/2012   Procedure: OPEN REDUCTION INTERNAL FIXATION (ORIF) ANKLE FRACTURE;  Surgeon: Lynwood FORBES Better, MD;  Location: [facility];  Service: Orthopedics;  Laterality: Right;   TUBAL LIGATION      FAMILY HISTORY: family history includes Asthma in her father; Diabetes in her maternal aunt; Heart attack in her father; Obesity in her father and mother; Other in her mother.  SOCIAL HISTORY:  reports that she quit smoking about 8 years ago. Her smoking use included cigarettes. She started smoking about 43 years ago.  She has a 17.5 pack-year smoking history. She has never used smokeless tobacco. She reports current drug use. Drug: Marijuana. She reports that she does not drink alcohol .  ALLERGIES: Latuda  [lurasidone ]  MEDICATIONS:  No current facility-administered medications for this encounter.   No current outpatient medications on file.   Facility-Administered Medications Ordered in Other Encounters  Medication Dose Route Frequency Provider Last Rate Last Admin   acetaminophen  (TYLENOL ) tablet 650 mg  650 mg Oral Q6H PRN Franky Redia SAILOR, MD       Or   acetaminophen  (TYLENOL ) suppository 650 mg  650 mg Rectal Q6H PRN Franky Redia SAILOR, MD       acyclovir  (ZOVIRAX ) tablet 400 mg  400 mg Oral BID Franky Redia SAILOR, MD   400 mg at 11/09/24 1000   amiodarone  (PACERONE ) tablet 200 mg  200 mg Oral BID Kakrakandy, Arshad N, MD   200 mg at 11/09/24 1000   calcitonin (salmon) (MIACALCIN MARCIANA) nasal spray 1 spray  1 spray Alternating Nares Daily Kakrakandy, Arshad N, MD   1 spray at 11/09/24 1001   cefTRIAXone  (ROCEPHIN ) 2 g in sodium chloride  0.9 % 100 mL IVPB  2 g Intravenous Q24H Kakrakandy, Arshad N, MD   Stopped at 11/08/24 1744   dexamethasone  (DECADRON ) tablet 4 mg  4 mg Oral Q6H Will Almarie MATSU, MD       diltiazem  (CARDIZEM  CD) 24 hr capsule 240 mg  240 mg Oral Daily Kakrakandy, Arshad N, MD   240 mg at 11/09/24 1001   doxycycline  (VIBRA -TABS) tablet 100 mg  100 mg Oral Q12H Mathews, Elizabeth G, MD   100 mg at 11/09/24 1001   ferrous sulfate  tablet 325 mg  325 mg Oral Q breakfast Kakrakandy, Arshad N, MD   325 mg at 11/09/24 1001   fluticasone  furoate-vilanterol (BREO ELLIPTA ) 100-25 MCG/ACT 1 puff  1 puff Inhalation Daily Franky Redia SAILOR, MD   1 puff at 11/09/24 1002   folic acid  (FOLVITE ) tablet 1 mg  1 mg Oral BID Kakrakandy, Arshad N, MD   1 mg at 11/09/24 1000   furosemide  (LASIX ) injection 40 mg  40 mg Intravenous Daily Mathews, Elizabeth G, MD   40 mg at 11/09/24 1542    HYDROmorphone  (DILAUDID ) injection 1 mg  1 mg Intravenous Q4H PRN Mathews, Elizabeth G, MD   1 mg at 11/08/24 2112   potassium chloride  SA (KLOR-CON  M) CR tablet 20 mEq  20 mEq Oral Daily Will Almarie MATSU, MD   20 mEq at 11/09/24 1542   thiamine  (VITAMIN B1) tablet 100 mg  100 mg Oral Daily Carolee Browning T, RPH   100 mg at 11/09/24 1000    REVIEW OF SYSTEMS: Notable for that above.   PHYSICAL EXAM:   General: Alert and oriented, in no acute distress HEENT: Head is normocephalic. Extraocular movements are intact. Neck: Neck is supple. Heart: Regular in rate and rhythm with no murmurs, rubs, or gallops. Chest: Clear to auscultation bilaterally, with decreased breath sounds appreciated at the right lung base. Abdomen: Soft, nontender, nondistended, with no rigidity or guarding. Extremities: No pedal edema. Lymphatics: SCDs in-place.  Skin: No concerning lesions. Musculoskeletal: 5/5 strength dorsiflexion, plantar flexion, gastrocnemius, handgrip, and biceps.  Neurologic: Cranial nerves II through XII are grossly intact. No obvious focalities. Speech is fluent. Coordination is intact. Sensory function to light touch is grossly intact.  Psychiatric: Judgment and insight are intact. Affect is appropriate.   ECOG = 3  0 - Asymptomatic (Fully active, able to carry on all predisease activities without restriction)  1 - Symptomatic but completely ambulatory (Restricted in physically strenuous activity but ambulatory and able to carry out work of a light or sedentary nature. For example, light housework, office work)  2 - Symptomatic, <50% in bed during the day (Ambulatory and capable of all self care but unable to carry out any work activities. Up and about more than 50% of waking hours)  3 - Symptomatic, >50% in bed, but not bedbound (Capable of only limited self-care, confined to bed or chair 50% or more of waking hours)  4 - Bedbound (Completely disabled. Cannot carry on any self-care.  Totally confined to bed or chair)  5 - Death   Carla Hanson, Creech RH, Tormey DC, et al. [phone]). Toxicity and response criteria of the [facility]. Am. DOROTHA Bridges. Oncol. 5 (6): 649-55  LABORATORY DATA:  Lab Results  Component Value Date   WBC 11.7 (H) 11/09/2024   HGB 8.4 (L) 11/09/2024   HCT 26.7 (L) 11/09/2024   MCV 87.3 11/09/2024   PLT 15 (LL) 11/09/2024   NEUTROABS 4.9 11/07/2024   Lab Results  Component Value Date   NA 138 11/09/2024   K 4.2 11/09/2024   CL 101 11/09/2024   CO2 28 11/09/2024   GLUCOSE 201 (H) 11/09/2024   BUN 15 11/09/2024   CREATININE 0.52 11/09/2024   CALCIUM  10.7 (H) 11/09/2024      RADIOGRAPHY: MR THORACIC SPINE W WO CONTRAST Result Date: 11/08/2024 EXAM: MRI THORACIC SPINE WITH AND WITHOUT INTRAVENOUS CONTRAST 11/08/2024 12:08:51 PM TECHNIQUE: Multiplanar multisequence MRI of the thoracic spine was performed with and without the administration of 10 mL gadobutrol  (GADAVIST ) 1 MMOL/ML injection. COMPARISON: CT of the chest dated 11/07/2024. CLINICAL HISTORY: Spine metastases, thoracic, monitor. FINDINGS: BONES AND ALIGNMENT: Normal alignment. The thoracic vertebrae are all diffusely reduced in T1 signal intensity and/or heterogeneous in signal intensity on T2. There is a vertebral planar deformity of T6 again demonstrated. There are pathological fractures of T3 and T4. There is a downward bone deformity of the superior endplate of T12. The vertebral bodies also demonstrate heterogeneous contrast enhancement. The findings are highly suggestive of widespread metastatic disease involving the thoracic spine, epidural space, and paraspinous regions, with associated pathological fractures, vertebral deformities, and spinal canal stenosis. Follow-up imaging, potentially with a dedicated whole-body bone scan or PET/CT, may be considered for further assessment of disease extent. Clinical correlation with known primary malignancy is essential. SPINAL  CORD: Normal spinal cord volume. Normal spinal cord signal. There are no definite spinal cord lesions. SOFT TISSUES: There is extensive enhancing epidural and paraspinous metastatic disease, which is resulting in multilevel spinal canal stenosis. There are mild bilateral pleural effusions. DEGENERATIVE CHANGES: No significant disc herniation. At T6, there is posterior bowing of the posterior wall of the vertebral body, which abuts the ventral surface of the spinal cord. There is multilevel spinal canal stenosis resulting from extensive enhancing epidural and paraspinous metastatic disease. IMPRESSION: 1. Extensive enhancing epidural and paraspinous disease with diffuse abnormal/enhancing thoracic marrow signal resulting in multilevel spinal canal stenosis; at T6 there is posterior vertebral body bowing abutting the ventral cord without definite intramedullary cord lesion. Overall appearance is consistent with multiple myeloma. Recommend urgent neurosurgical and/or radiation oncology consultation to assess for impending cord compression. 2. Pathological fractures of T3 and T4, vertebral planar deformity  of T6, and downward deformity of the superior endplate of T12. 3. Mild bilateral pleural effusions. Electronically signed by: Evalene Coho MD 11/08/2024 12:44 PM EST RP Workstation: [id number]   CT CHEST WO CONTRAST Result Date: 11/07/2024 EXAM: CT CHEST WITHOUT CONTRAST 11/07/2024 07:57:35 PM TECHNIQUE: CT of the chest was performed without the administration of intravenous contrast. Multiplanar reformatted images are provided for review. Automated exposure control, iterative reconstruction, and/or weight based adjustment of the mA/kV was utilized to reduce the radiation dose to as low as reasonably achievable. COMPARISON: Portable chest films from 10/31/2024 and 10/26/2024, full body PET CT 02/12/2024, and CTA chest 10/31/2016. CLINICAL HISTORY: Pleural effusion, malignancy suspected. Patient has a history of  multiple myeloma. FINDINGS: MEDIASTINUM: Heart is moderately enlarged. There is no pericardial effusion. There are calcifications in the left main and proximal LAD coronary artery. The cardiac blood pool is hypodense consistent with anemia. The pulmonary trunk is prominent measuring 3.9 cm indicating arterial hypertension (previously 3.8 cm). There is mild prominence of the central veins. Mild aortic atherosclerosis without aneurysm. The great vessels branched normally. There is lobulated substernal soft tissue thickening in the mediastinum which is most likely related to myelomatous involvement of sternum with multiple lytic sternal lesions noted, some with cortical breakthrough posteriorly. The trachea and main bronchi are clear. Central airways are small in caliber, which could be due to active breathing or bronchospasm. LYMPH NODES: There are shotty mediastinal lymph nodes in the AP window, prevascular and subcarinal spaces up to 10 Hanson. Hilar adenopathy is not well evaluated without contrast. No axillary adenopathy is seen. LUNGS AND PLEURA: There are moderate-sized left greater than right pleural effusions. Some of the left pleural fluid could be loculated anterolaterally. The pleural effusions collapse and obscure the lower lobes on the left greater than right. There is posterior atelectasis in the upper and right middle lobes along the fissures. Linear atelectasis in the bases. There is multifocal lobular pleural thickening in both hemithoraces probably also due to myelomatous involvement affecting the rib cage, otherwise could be other pleural metastases. No pneumothorax. SOFT TISSUES/BONES: There is a 6 Hanson hypodense nodule in the left lobe of the thyroid . No follow-up imaging is recommended. Generalized edema of the chest wall. There are extensive lytic lesions present throughout the spine, sternum, ribs, and several in the scapulae. There are multiple fractures in various stages of healing of the bilateral  ribs. There is a severe age indeterminate pathologic compression fracture of the T6 vertebral body and a moderate compression fracture of T4, at T6 with vertebra plana, retropulsion and soft tissue extending into the spinal canal, narrowing the thecal sac to 7 Hanson AP with likely mild compressive effect on the thoracic cord. . There is a mild upper plate pathologic wedge compression fracture of the L2 vertebral body as well. UPPER ABDOMEN: Limited images of the upper abdomen demonstrate suspected multiple liver masses, not well seen due to beam hardening artifact. There is a nodular surface contour to the liver, which was also not seen previously. . On the last image, there is a vague 2.7 cm hypodense mass in segment 4, and a suspected 1.4 cm periligamentous mass in this segment also in the last image. There are probably other masses obscured by beam hardening. Further evaluation is recommended. IMPRESSION: 1. Moderate-sized left greater than right pleural effusions, with possible partial loculation of the left effusion, and associated multifocal lobular pleural thickening, likely related to myelomatous involvement or other pleural metastases, new since 02/12/2024. 2. Extensive osseous  lytic disease with multiple bilateral rib fractures in various stages of healing , and age indeterminate . severe pathologic compression fracture of T6 with vertebra plana, retropulsion, and soft tissue extension into the spinal canal with likely mild thoracic cord compression; additional moderate T4 and mild L2 pathologic compression fractures are present, and thoracic spine MRI is recommended. 3. Suspected multiple liver masses, suboptimally evaluated due to beam hardening artifact, and contrast-enhanced CT or MRI of the abdomen is recommended for further evaluation. 4. The pleural effusions collapse and obscure the lower lobes on the left greater than right. Underlying pneumonia or mass would be difficult to exclude. 5. Cardiomegaly  with enlarged pulmonary trunk , prominent central veins , edema of the body wall. No pulmonary edema is seen. 6. Aortic and coronary artery atherosclerosis. Electronically signed by: Francis Quam MD 11/07/2024 08:40 PM EST RP Workstation: [id number]   DG Chest Port 1 View Result Date: 11/07/2024 CLINICAL DATA:  Shortness of breath EXAM: PORTABLE CHEST 1 VIEW COMPARISON:  10/31/2024, PET CT 02/12/2024, chest x-ray 09/08/2024 FINDINGS: Cardiomegaly with vascular congestion. Small right and moderate left pleural effusions, increased compared to prior. Worsened airspace disease at both bases. Aortic atherosclerosis. No pneumothorax IMPRESSION: Cardiomegaly with vascular congestion. Small right and moderate left pleural effusions, increased compared to prior. Worsened airspace disease at both bases may be due to atelectasis or pneumonia. Electronically Signed   By: Luke Bun M.D.   On: 11/07/2024 15:54   DG CHEST PORT 1 VIEW Result Date: 10/31/2024 CLINICAL DATA:  Pleural effusion.  Multiple myeloma. EXAM: PORTABLE CHEST 1 VIEW COMPARISON:  10/26/2024 FINDINGS: Stable cardiomegaly and pulmonary vascular congestion. Stable mild pleural thickening or tiny pleural effusions. No significant change in left lower lobe atelectasis or scarring. No new or worsening areas of pulmonary opacity are seen. IMPRESSION: Stable cardiomegaly and pulmonary vascular congestion. Stable left lower lobe atelectasis or scarring. Stable tiny bilateral pleural effusions versus pleural thickening. Electronically Signed   By: Norleen DELENA Kil M.D.   On: 10/31/2024 09:54   ECHOCARDIOGRAM COMPLETE Result Date: 10/30/2024    ECHOCARDIOGRAM REPORT   Patient Name:   DANNETTA LEKAS [facility] Date of Exam: 10/30/2024 Medical Rec #:  [id number]      Height:       61.5 in Accession #:    [id number]     Weight:       204.1 lb Date of Birth:  November 18, 1965     BSA:          1.917 m Patient Age:    58 years       BP:           126/70 mmHg Patient Gender: F               HR:           86 bpm. Exam Location:  Inpatient Procedure: 2D Echo, Cardiac Doppler, Color Doppler and Intracardiac            Opacification Agent (Both Spectral and Color Flow Doppler were            utilized during procedure). Indications:    Atrial Flutter  History:        Patient has prior history of Echocardiogram examinations, most                 recent 12/05/2021.  Sonographer:    Philomena Daring Referring Phys: [id number] JOETTE PEBBLES IMPRESSIONS  1. Left ventricular ejection fraction, by estimation, is 60 to 65%. The left  ventricle has normal function. The left ventricle has no regional wall motion abnormalities. Left ventricular diastolic parameters are indeterminate.  2. Right ventricular systolic function is normal. The right ventricular size is mildly enlarged. There is mildly elevated pulmonary artery systolic pressure. The estimated right ventricular systolic pressure is 39.9 mmHg.  3. Large pleural effusion in the left lateral region.  4. The mitral valve was not well visualized. No evidence of mitral valve regurgitation.  5. The aortic valve is tricuspid. Aortic valve regurgitation is not visualized. No aortic stenosis is present.  6. The inferior vena cava is dilated in size with <50% respiratory variability, suggesting right atrial pressure of 15 mmHg. Comparison(s): Prior images reviewed side by side. 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 FINDINGS  Left Ventricle: Left ventricular ejection fraction, by estimation, is 60 to 65%. The left ventricle has normal function. The left ventricle has no regional wall motion abnormalities. The left ventricular internal cavity size was normal in size. There is  no left ventricular hypertrophy. Left ventricular diastolic parameters are indeterminate. Right Ventricle: The right ventricular size is mildly enlarged. No increase in right ventricular wall thickness. Right ventricular systolic function is normal. There is mildly elevated pulmonary  artery systolic pressure. The tricuspid regurgitant velocity is 2.23 m/s, and with an assumed right atrial pressure of 20 mmHg, the estimated right ventricular systolic pressure is 39.9 mmHg. Left Atrium: Left atrial size was normal in size. Right Atrium: Right atrial size was normal in size. Pericardium: There is no evidence of pericardial effusion. Mitral Valve: The mitral valve was not well visualized. No evidence of mitral valve regurgitation. Tricuspid Valve: The tricuspid valve is normal in structure. Tricuspid valve regurgitation is trivial. No evidence of tricuspid stenosis. Aortic Valve: The aortic valve is tricuspid. Aortic valve regurgitation is not visualized. No aortic stenosis is present. Aortic valve mean gradient measures 9.0 mmHg. Aortic valve peak gradient measures 18.6 mmHg. Aortic valve area, by VTI measures 2.62  cm. Pulmonic Valve: The pulmonic valve was normal in structure. Pulmonic valve regurgitation is trivial. No evidence of pulmonic stenosis. Aorta: The aortic root and ascending aorta are structurally normal, with no evidence of dilitation. Venous: The inferior vena cava is dilated in size with less than 50% respiratory variability, suggesting right atrial pressure of 15 mmHg. IAS/Shunts: No atrial level shunt detected by color flow Doppler. Additional Comments: There is a large pleural effusion in the left lateral region.  LEFT VENTRICLE PLAX 2D LVIDd:         5.00 cm   Diastology LVIDs:         3.40 cm   LV e' medial:    7.72 cm/s LV PW:         1.00 cm   LV E/e' medial:  12.9 LV IVS:        0.90 cm   LV e' lateral:   11.00 cm/s LVOT diam:     2.00 cm   LV E/e' lateral: 9.1 LV SV:         87 LV SV Index:   45 LVOT Area:     3.14 cm  RIGHT VENTRICLE             IVC RV S prime:     20.40 cm/s  IVC diam: 2.60 cm TAPSE (M-mode): 3.2 cm LEFT ATRIUM             Index        RIGHT ATRIUM  Index LA Vol (A2C):   53.7 ml 28.01 ml/m  RA Area:     17.00 cm LA Vol (A4C):   34.2 ml 17.84  ml/m  RA Volume:   39.90 ml  20.82 ml/m LA Biplane Vol: 43.6 ml 22.75 ml/m  AORTIC VALVE AV Area (Vmax):    2.51 cm AV Area (Vmean):   2.50 cm AV Area (VTI):     2.62 cm AV Vmax:           215.50 cm/s AV Vmean:          137.500 cm/s AV VTI:            0.333 m AV Peak Grad:      18.6 mmHg AV Mean Grad:      9.0 mmHg LVOT Vmax:         172.00 cm/s LVOT Vmean:        109.500 cm/s LVOT VTI:          0.278 m LVOT/AV VTI ratio: 0.83  AORTA Ao Root diam: 3.20 cm Ao Asc diam:  3.00 cm MITRAL VALVE                TRICUSPID VALVE MV Area (PHT): 4.71 cm     TR Peak grad:   19.9 mmHg MV Decel Time: 161 msec     TR Vmax:        223.00 cm/s MV E velocity: 99.80 cm/s MV A velocity: 101.00 cm/s  SHUNTS MV E/A ratio:  0.99         Systemic VTI:  0.28 m                             Systemic Diam: 2.00 cm Stanly Leavens MD Electronically signed by Stanly Leavens MD Signature Date/Time: 10/30/2024/3:14:55 PM    Final    IR BONE MARROW BIOPSY & ASPIRATION Result Date: 10/27/2024 INDICATION: Myeloma, reassess marrow status EXAM: FLUOROSCOPICALLY GUIDED RIGHT ILIAC BONE MARROW ASPIRATION AND CORE BIOPSY Date:  10/27/2024 10/27/2024 10:19 am Radiologist:  CHRISTELLA. Frederic Specking, MD Guidance: Fluoroscopy FLUOROSCOPY: Fluoroscopy Time:  (56 mGy). MEDICATIONS: 1% lidocaine  local ANESTHESIA/SEDATION: 1.5 mg IV Versed ; 100 mcg IV Fentanyl  Moderate Sedation Time:  11 minutes The patient was continuously monitored during the procedure by the interventional radiology nurse under my direct supervision. CONTRAST:  None. COMPLICATIONS: None immediate PROCEDURE: Informed consent was obtained from the patient following explanation of the procedure, risks, benefits and alternatives. The patient understands, agrees and consents for the procedure. All questions were addressed. A time out was performed. The patient was positioned prone and fluoroscopic localization was performed of the pelvis to demonstrate the iliac marrow spaces. Maximal  barrier sterile technique utilized including caps, mask, sterile gowns, sterile gloves, large sterile drape, hand hygiene, and Betadine prep. Under sterile conditions and local anesthesia, an 11 gauge coaxial bone biopsy needle was advanced into the right iliac marrow space. Needle position was confirmed with fluoroscopic imaging. Initially, bone marrow aspiration was performed. Next, the 11 gauge outer cannula was utilized to obtain a right iliac bone marrow core biopsy. Needle was removed. Hemostasis was obtained with compression. The patient tolerated the procedure well. Samples were prepared with the cytotechnologist. No immediate complications. IMPRESSION: Fluoroscopically guided right iliac bone marrow aspiration and core biopsy. Electronically Signed   By: CHRISTELLA.  Shick M.D.   On: 10/27/2024 10:31   DG CHEST PORT 1 VIEW Result Date: 10/26/2024 CLINICAL DATA:  Dyspnea.  EXAM: PORTABLE CHEST 1 VIEW COMPARISON:  09/08/2024 FINDINGS: The heart is enlarged. Mediastinal contours are stable. Vascular congestion. There are small bilateral pleural effusions. Ill-defined bilateral infrahilar opacities. No pneumothorax. IMPRESSION: 1. Cardiomegaly with vascular congestion and small bilateral pleural effusions. 2. Ill-defined bilateral infrahilar opacities, atelectasis versus pneumonia. Electronically Signed   By: Andrea Gasman M.D.   On: 10/26/2024 16:52      IMPRESSION: 59 yo female with T6 pathologic compression fracture resulting in mild cord compression secondary to refractory IgG Multiple Myeloma  We reviewed this patient's current workup.  She presents today with a T6 pathologic compression fracture resulting in mild cord compression, consistent with her history of refractory multiple myeloma.  This was found incidentally, and she fortunately remains asymptomatic from this lesion. Dr. Shannon recommends palliative radiation to this area to reduce her risk of further cord compression. Patient is currently on  10 mg Decadron  BID.   Today, I talked to the patient about the findings and work-up thus far.  We discussed the natural history of vertebral pathologic compression fractures and general treatment, highlighting the role of radiotherapy in the management.  We discussed the available radiation techniques, and focused on the details of logistics and delivery.  We reviewed the anticipated acute and late sequelae associated with radiation in this setting.  The patient was encouraged to ask questions that I answered to the best of my ability. A patient consent form was discussed and signed.  We retained a copy for our records.   The patient is inclined to proceed with radiation however she would like to discuss with her family. Ideally we would SIM her today but she does not feel comfortable moving forward without talking to her mother and three children. She expressed a good understanding of the urgency of the situation and was educated on signs/symptoms of cord compression. She understands to notify staff immediately if she develops any of these. She was given our number to contact if she or her family has any questions or concerns.    PLAN: Patient has been scheduled for CT simulation and her first treatment on 11/11/2023. Dr. Shannon anticipates a course of 25 Gy in 10 fractions (delivered Monday-Friday) directed at T6 vertebra.   We look forward to participating in this patient's care.    I spent 60 minutes minutes face to face with the patient and more than 50% of that time was spent in counseling and/or coordination of care.   ------------------------------------------------   Leeroy Due, PA-C   Lynwood CHARM Shannon, PhD, MD   [facility]  Radiation Oncology Direct Dial: [phone]  Fax: [phone] [email or web address]          "

## 2024-11-09 NOTE — TOC Initial Note (Signed)
 Transition of Care [facility]) - Initial/Assessment Note    Patient Details  Name: Carla Hanson MRN: [id number] Date of Birth: 06-25-1966  Transition of [facility]) CM/SW Contact:    Toy LITTIE Agar, RN Phone [phone]  11/09/2024, 3:16 PM  Clinical Narrative:                 Inpatient care manager following patient with high risk for readmission. Patient is from home where her mother  lives with her. Patient states that she was independent until a few weeks ago and she got to the point where she was unable to walk. Patient states that she has DME at home (walker , rollator , wheelchair, cane, BSC & shower seat). Patient states that she has home health with [facility]. Patient does have PCP that she follows on a regular basis. Patient confirms that she has insurance and access to medications.   Inpatient care manager following for pending PT/OT evaluation.   Expected Discharge Plan: Home w Home Health Services Barriers to Discharge: Continued Medical Work up   Patient Goals and CMS Choice Patient states their goals for this hospitalization and ongoing recovery are:: Patient not sure at this time   Choice offered to / list presented to : NA [place] ownership interest in [facility].provided to::  (n/a)    Expected Discharge Plan and Services In-house Referral: NA Discharge Planning Services: CM Consult Post Acute Care Choice:  (patient states that she has HH PT /OT/RN/ (BSC , shower chair, rollator, walker, wheelchair)) Living arrangements for the past 2 months: Single Family Home                 DME Arranged: N/A DME Agency: NA       HH Arranged: NA HH Agency: NA        Prior Living Arrangements/Services Living arrangements for the past 2 months: Single Family Home Lives with:: Parents Patient language and need for interpreter reviewed:: Yes Do you feel safe going back to the place where you live?: Yes      Need for Family Participation in Patient Care:  Yes (Comment) Care giver support system in place?: Yes (comment) Current home services: DME (Patient already has a wheelchair, rollator, rolling walker, tub bench and bedside commode) Criminal Activity/Legal Involvement Pertinent to Current Situation/Hospitalization: No - Comment as needed  Activities of Daily Living   ADL Screening (condition at time of admission) Independently performs ADLs?: No Does the patient have a NEW difficulty with bathing/dressing/toileting/self-feeding that is expected to last >3 days?: Yes (Initiates electronic notice to provider for possible OT consult) Does the patient have a NEW difficulty with getting in/out of bed, walking, or climbing stairs that is expected to last >3 days?: Yes (Initiates electronic notice to provider for possible PT consult) Does the patient have a NEW difficulty with communication that is expected to last >3 days?: No Is the patient deaf or have difficulty hearing?: No Does the patient have difficulty seeing, even when wearing glasses/contacts?: No Does the patient have difficulty concentrating, remembering, or making decisions?: No  Permission Sought/Granted Permission sought to share information with : Family Supports Permission granted to share information with : Yes, Release of Information Signed  Share Information with NAME: Zettie Lee [phone]     Permission granted to share info w Relationship: mother  Permission granted to share info w Contact Information: [phone]  Emotional Assessment Appearance:: Appears older than stated age Attitude/Demeanor/Rapport: Gracious Affect (typically observed): Pleasant, Overwhelmed (patient c/o  feeling overwelmed but is pleasant) Orientation: : Oriented to Self, Oriented to Place, Oriented to  Time, Oriented to Situation Alcohol  / Substance Use: Not Applicable Psych Involvement: No (comment)  Admission diagnosis:  Acute respiratory failure (HCC) [J96.00] Pleural effusion  [J90] Hypoxia [R09.02] Community acquired pneumonia, unspecified laterality [J18.9] Patient Active Problem List   Diagnosis Date Noted   Compression fracture of T6 vertebra (HCC) 11/08/2024   Acute on chronic heart failure with preserved ejection fraction (HFpEF) (HCC) 11/08/2024   Hypoxia 11/08/2024   Acute respiratory failure with hypoxia (HCC) 11/07/2024   Pleural effusion 11/07/2024   Acute respiratory failure (HCC) 11/07/2024   Need for emotional support 11/03/2024   Pain 11/02/2024   Counseling and coordination of care 11/02/2024   Goals of care, counseling/discussion 11/02/2024   Palliative care encounter 11/02/2024   Thrombocytopenia 10/23/2024   Anemia due to antineoplastic agent 10/21/2024   Counseling regarding advance care planning and goals of care 10/21/2024   Symptomatic anemia 10/20/2024   Moderate protein-calorie malnutrition 09/21/2024   Chemotherapy-induced neutropenia 09/09/2024   Folate deficiency 09/09/2024   Pancytopenia (HCC) 09/08/2024   History of compression fracture of spine 04/12/2024   Electrolyte abnormality 02/10/2024   Edema leg 02/10/2024   Hypercalcemia of malignancy 02/02/2024   Multiple myeloma not having achieved remission (HCC) 02/02/2024   Hypokalemia 01/30/2024   BMI 40.0-44.9, adult (HCC) 12/18/2022   Chronic pain of both shoulders 09/05/2022   Vitamin D  deficiency 12/19/2020   Palpitation 12/03/2019   History of intracranial hemorrhage 08/02/2019   History of DVT of lower extremity 08/02/2019   History of seizure 08/02/2019   COPD, moderate (HCC) 12/11/2018   Anemia 02/11/2018   History of hemorrhagic cerebrovascular accident (CVA) without residual deficits 08/13/2017   Iron deficiency anemia 12/12/2016   Pre-diabetes 03/02/2013   History of pulmonary embolism 05/05/2012   Former tobacco use 08/23/2009   Hyperlipidemia 04/30/2007   Essential hypertension 01/01/2007   PCP:  Anders Otto DASEN, MD Pharmacy:   [facility] [address] [place] [address] Phone: [phone] Fax: [phone]     Social Drivers of Health (SDOH) Social History: SDOH Screenings   Food Insecurity: No Food Insecurity (11/07/2024)  Housing: Low Risk (11/07/2024)  Transportation Needs: No Transportation Needs (11/07/2024)  Utilities: Not At Risk (11/07/2024)  Depression (PHQ2-9): Low Risk (09/08/2024)  Financial Resource Strain: Low Risk (05/14/2024)  Physical Activity: Insufficiently Active (05/14/2024)  Social Connections: Moderately Integrated (10/20/2024)  Stress: No Stress Concern Present (05/14/2024)  Tobacco Use: Medium Risk (11/08/2024)   SDOH Interventions:     Readmission Risk Interventions    11/09/2024    3:02 PM 10/26/2024    2:05 PM 02/03/2024    1:14 PM  Readmission Risk Prevention Plan  Transportation Screening Complete Complete Complete  PCP or Specialist Appt within 3-5 Days Complete Complete Complete  HRI or Home Care Consult Complete Complete   Social Work Consult for Recovery Care Planning/Counseling Complete Complete Complete  Palliative Care Screening Not Applicable  Not Applicable  Medication Review Oceanographer) Referral to Pharmacy Complete Complete

## 2024-11-10 ENCOUNTER — Ambulatory Visit: Admitting: Radiation Oncology

## 2024-11-10 ENCOUNTER — Ambulatory Visit: Attending: Radiation Oncology | Admitting: Radiation Oncology

## 2024-11-10 ENCOUNTER — Other Ambulatory Visit: Payer: Self-pay

## 2024-11-10 DIAGNOSIS — R52 Pain, unspecified: Secondary | ICD-10-CM

## 2024-11-10 DIAGNOSIS — L8990 Pressure ulcer of unspecified site, unspecified stage: Secondary | ICD-10-CM

## 2024-11-10 DIAGNOSIS — J9601 Acute respiratory failure with hypoxia: Secondary | ICD-10-CM | POA: Diagnosis not present

## 2024-11-10 DIAGNOSIS — I4892 Unspecified atrial flutter: Secondary | ICD-10-CM | POA: Insufficient documentation

## 2024-11-10 DIAGNOSIS — Z515 Encounter for palliative care: Secondary | ICD-10-CM | POA: Diagnosis not present

## 2024-11-10 DIAGNOSIS — Z7189 Other specified counseling: Secondary | ICD-10-CM | POA: Diagnosis not present

## 2024-11-10 LAB — RAD ONC ARIA SESSION SUMMARY
Course Elapsed Days: 0
Plan Fractions Treated to Date: 1
Plan Prescribed Dose Per Fraction: 2.5 Gy
Plan Total Fractions Prescribed: 10
Plan Total Prescribed Dose: 25 Gy
Reference Point Dosage Given to Date: 2.5 Gy
Reference Point Session Dosage Given: 2.5 Gy
Session Number: 1

## 2024-11-10 MED ORDER — ALBUTEROL SULFATE (2.5 MG/3ML) 0.083% IN NEBU
2.5000 mg | INHALATION_SOLUTION | Freq: Four times a day (QID) | RESPIRATORY_TRACT | Status: DC | PRN
  Administered 2024-11-10: 2.5 mg via RESPIRATORY_TRACT
  Filled 2024-11-10: qty 3

## 2024-11-10 NOTE — Assessment & Plan Note (Signed)
 Hemoglobin remained stable - Trend CBC - Continue ferrous sulfate 

## 2024-11-10 NOTE — Progress Notes (Signed)
 "                                                                                                                                                                                                          Daily Progress Note   Patient Name: Carla Hanson       Date: 11/10/2024 DOB: October 07, 1966  Age: 59 y.o. MRN#: [id number] Attending Physician: Jonel Lonni SQUIBB, * Primary Care Physician: Anders Otto DASEN, MD Admit Date: 11/07/2024  Reason for Consultation/Follow-up: Establishing goals of care  Subjective:    Length of Stay: 3  Current Medications: Scheduled Meds:   acyclovir   400 mg Oral BID   amiodarone   200 mg Oral BID   calcitonin (salmon)  1 spray Alternating Nares Daily   dexamethasone   4 mg Oral Q6H   diltiazem   240 mg Oral Daily   doxycycline   100 mg Oral Q12H   ferrous sulfate   325 mg Oral Q breakfast   fluticasone  furoate-vilanterol  1 puff Inhalation Daily   folic acid   1 mg Oral BID   furosemide   40 mg Intravenous Daily   potassium chloride   20 mEq Oral Daily   thiamine   100 mg Oral Daily    Continuous Infusions:  cefTRIAXone  (ROCEPHIN )  IV 2 g (11/09/24 1746)    PRN Meds: acetaminophen  **OR** acetaminophen , alum & mag hydroxide-simeth, HYDROmorphone  (DILAUDID ) injection  Physical Exam          Vital Signs: BP 129/71 (BP Location: Right Arm)   Pulse 75   Temp 98.4 F (36.9 C) (Oral)   Resp 14   Ht 5' 1.5 (1.562 m)   Wt 92.6 kg   LMP  (LMP Unknown)   SpO2 97%   BMI 37.95 kg/m  SpO2: SpO2: 97 % O2 Device: O2 Device: Nasal Cannula O2 Flow Rate: O2 Flow Rate (L/min): 3 L/min  Intake/output summary:  Intake/Output Summary (Last 24 hours) at 11/10/2024 1409 Last data filed at 11/10/2024 1000 Gross per 24 hour  Intake 647 ml  Output 3730 ml  Net -3083 ml   LBM: Last BM Date : 11/10/24 Baseline Weight: Weight: 92.6 kg Most recent weight: Weight: 92.6 kg       Palliative Assessment/Data:      Patient Active Problem List   Diagnosis Date  Noted   Compression fracture of T6 vertebra (HCC) 11/08/2024   Acute on chronic heart failure with preserved ejection fraction (HFpEF) (HCC) 11/08/2024   Hypoxia 11/08/2024   Acute respiratory failure with hypoxia (HCC) 11/07/2024   Pleural effusion 11/07/2024  Acute respiratory failure (HCC) 11/07/2024   Need for emotional support 11/03/2024   Pain 11/02/2024   Counseling and coordination of care 11/02/2024   Goals of care, counseling/discussion 11/02/2024   Palliative care encounter 11/02/2024   Thrombocytopenia 10/23/2024   Anemia due to antineoplastic agent 10/21/2024   Counseling regarding advance care planning and goals of care 10/21/2024   Symptomatic anemia 10/20/2024   Moderate protein-calorie malnutrition 09/21/2024   Chemotherapy-induced neutropenia 09/09/2024   Folate deficiency 09/09/2024   Pancytopenia (HCC) 09/08/2024   History of compression fracture of spine 04/12/2024   Electrolyte abnormality 02/10/2024   Edema leg 02/10/2024   Hypercalcemia of malignancy 02/02/2024   Multiple myeloma not having achieved remission (HCC) 02/02/2024   Hypokalemia 01/30/2024   BMI 40.0-44.9, adult (HCC) 12/18/2022   Chronic pain of both shoulders 09/05/2022   Vitamin D  deficiency 12/19/2020   Palpitation 12/03/2019   History of intracranial hemorrhage 08/02/2019   History of DVT of lower extremity 08/02/2019   History of seizure 08/02/2019   COPD, moderate (HCC) 12/11/2018   Anemia 02/11/2018   History of hemorrhagic cerebrovascular accident (CVA) without residual deficits 08/13/2017   Iron deficiency anemia 12/12/2016   Pre-diabetes 03/02/2013   History of pulmonary embolism 05/05/2012   Former tobacco use 08/23/2009   Hyperlipidemia 04/30/2007   Essential hypertension 01/01/2007    Palliative Care Assessment & Plan   59 year old lady with refractory IgG lambda multiple myeloma, follows with Dr. Onesimo at [facility], admitted with weakness to the hospital  medicine service, also seen by radiation oncology for T6 pathologic compression fracture Patient is status post multiple lines of chemotherapy She is in the process of being referred to [facility] for consideration of bite therapy Patient was admitted from October 20, 2024 through November 05, 2024 for symptomatic anemia that required 5 units of PRBC transfusion.  The stay was also significant for new onset A-fib and patient was placed on amiodarone  and Cardizem  Recent imaging has not shown that the patient has extensive osseous lytic disease with multiple bilateral rib fractures severe pathologic compression fracture of T6 suspected liver metastasis cardiomegaly, MRI thoracic spine on 11-08-2024 also showed extensive enhancing epidural paraspinous disease resulting in multilevel spinal canal stenosis.  1.  Pain-patient has Decadron  and IV hydromorphone  available for pain, restarted on radiation from 11-10-2024 after simulation.  Radiation oncology note reviewed and shared with the patient to the best of my ability.  Patient inquiring as to whether she can remain in the hospital until her radiation course is done.  2.  In light of acute hypoxic respiratory failure possibility of pneumonia she has also been placed on antibiotics.  3.  For her history of hypertension and atrial fibrillation-patient remains on cardiac medications Cardizem  and amiodarone  in addition to Lasix .  4.  CODE STATUS and goals of care discussions with patient and mother at bedside-full code full scope.  Rediscussed on 11-10-2024, we discussed about patient's current condition thoracic spine involvement from multiple myeloma necessitating radiation oncology informed parent and patient to begin 10 radiation treatments.  Risks benefits and alternatives were discussed in detail.  Patient and mother demonstrate understanding and express a desire to continue full code.I also advised patient about chaplain availability for spiritual support,  patient states that she has her own pastor who visits often.    Goals of Care and Additional Recommendations: Limitations on Scope of Treatment: Full Scope Treatment  Code Status:    Code Status Orders  (From admission, onward)  Start     Ordered   11/07/24 2056  Full code  Continuous       Question:  By:  Answer:  Consent: discussion documented in EHR   11/07/24 2056           Code Status History     Date Active Date Inactive Code Status Order ID Comments User Context   10/20/2024 1050 11/05/2024 2300 Full Code [id number]  Zella Katha HERO, MD ED   09/08/2024 1708 09/12/2024 1738 Full Code [id number]  Sebastian Toribio GAILS, MD ED   01/30/2024 2047 02/05/2024 2004 Full Code [id number]  Tobie Jorie SAUNDERS, MD Inpatient   11/08/2016 2132 11/13/2016 2053 Full Code [id number]  Michaela Aisha SQUIBB, MD ED   10/31/2016 2025 11/03/2016 1615 Full Code [id number]  Drusilla Sabas RAMAN, MD Inpatient   05/05/2012 0158 05/05/2012 2331 Full Code [id number]  Ritch, [place] BIRCH, MD Inpatient       Prognosis:  Unable to determine But does appear guarded given extensive nature of serious illness. Discharge Planning: Home with Palliative Services Also recommend palliative care follow-up at [facility] Care plan was discussed with patient and patient's mother present in the room  Thank you for allowing the Palliative Medicine Team to assist in the care of this patient. I personally spent a total of 50 minutes in the care of the patient today including preparing to see the patient, getting/reviewing separately obtained history, performing a medically appropriate exam/evaluation, counseling and educating, referring and communicating with other health care professionals, documenting clinical information in the EHR, and communicating results.      Greater than 50%  of this time was spent counseling and coordinating care related to the above assessment and plan.  Lonia Serve, MD  Please contact  Palliative Medicine Team phone at [phone] for questions and concerns.       "

## 2024-11-10 NOTE — Assessment & Plan Note (Signed)
-   Consult hematology and radiation oncology

## 2024-11-10 NOTE — Assessment & Plan Note (Addendum)
 No active symptoms - Continue antibiotics - Albuterol  as needed - Continue ICS/LABA

## 2024-11-10 NOTE — Evaluation (Signed)
 Physical Therapy Evaluation Patient Details Name: Carla Hanson MRN: [id number] DOB: Mar 20, 1966 Today's Date: 11/10/2024  History of Present Illness  Pt admitted with acute respiratory failure due to bilateral pleural effusions, T6 compression fx with mild cord compression in the setting of refractory multiple myeloma. Neurosurgery does not recommend surgical intervention. 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 PMHx includes HTN, HLD, LLE DVT, CVA w/o residual deficits, AFIB, bronchitis, PNA, COPD, PE, cystectomy, multiple myeloma (currently under tx), OA, anxiety, bipolar   Clinical Impression  Pt is a 59 y.o. female with above HPI resulting in the deficits listed below (see PT Problem List). Pt with noted functional decline ~6 months, recently has been performing ADLs at bed level with mother assisting; can perform UB bathing, grooming and self-feeding. Prior to admission 2-3 weeks ago, pt was able to transfer with +1 assist from bed <> BSC/wheelchair- reports mother would standby for safety. Pt required MOD A+2 for bed mobility and transfer this date. Pt performed sit to stand transfer x1 reporting increased fatigue and deferring further mobility. Encouraged for circulation/ROM intervention to be performed outside of therapy sessions. Pt will benefit from continued skilled PT to maximize functional mobility and increase independence. Recommend home with caregiver and HHPT upon d/c.          If plan is discharge home, recommend the following: Two people to help with walking and/or transfers;A lot of help with bathing/dressing/bathroom;Assistance with cooking/housework;Assist for transportation;Help with stairs or ramp for entrance   Can travel by private vehicle   No    Equipment Recommendations Hospital bed  Recommendations for Other Services       Functional Status Assessment Patient has had a recent  decline in their functional status and demonstrates the ability to make significant improvements in function in a reasonable and predictable amount of time.     Precautions / Restrictions Precautions Precautions: Fall;Back;Other (comment) Recall of Precautions/Restrictions: Impaired Precaution/Restrictions Comments: spinal precautions for comfort, may use TLSO for comfort per MD (brace not ordered, pt does not experience back pain throughout eval) Required Braces or Orthoses: Other Brace (per conversation with MD, pt may use TLSO for comfort. Spinal precautions for comfort.) Restrictions Weight Bearing Restrictions Per Provider Order: No      Mobility  Bed Mobility Overal bed mobility: Needs Assistance Bed Mobility: Rolling, Sidelying to Sit, Sit to Sidelying Rolling: Mod assist, +2 for physical assistance, +2 for safety/equipment Sidelying to sit: Mod assist, +2 for physical assistance, +2 for safety/equipment     Sit to sidelying: Mod assist, +2 for physical assistance, +2 for safety/equipment General bed mobility comments: educated on log rolling, use of bed rails- required multimodal cues, pt with good return demo. Mild dizziness once sititng EOB, resolved wiht prolonged seated rest.    Transfers Overall transfer level: Needs assistance Equipment used: 2 person hand held assist Transfers: Sit to/from Stand Sit to Stand: Mod assist, +2 physical assistance, +2 safety/equipment, From elevated surface           General transfer comment: Pt puts forth good effort and is motivated. Cues for upright posture. Able to stand for under 20 seconds before fatiguing. Lateral scooting with MOD A +2 and use of bed pads for repositioning prior to return to supine.    Ambulation/Gait                  Stairs  Wheelchair Mobility     Tilt Bed    Modified Rankin (Stroke Patients Only)       Balance Overall balance assessment: Needs  assistance Sitting-balance support: Bilateral upper extremity supported, Feet supported Sitting balance-Leahy Scale: Fair     Standing balance support: Bilateral upper extremity supported, During functional activity Standing balance-Leahy Scale: Poor Standing balance comment: under 20 seconds standing tolerance and requires external support                             Pertinent Vitals/Pain Pain Assessment Pain Assessment: 0-10 Pain Score: 3  Pain Location: L medial side of foot (started at 8/10 with standing attempt, decreased to 3/10 end of session) Pain Descriptors / Indicators: Sore, Other (Comment) (pulling) Pain Intervention(s): Limited activity within patient's tolerance, Monitored during session    Home Living Family/patient expects to be discharged to:: Private residence Living Arrangements: Alone Available Help at Discharge: Family;Available 24 hours/day Type of Home: Apartment Home Access: Level entry       Home Layout: One level Home Equipment: Rollator (4 wheels);Tub bench;Grab bars - tub/shower;Hand held shower head;BSC/3in1;Rolling Walker (2 wheels);Wheelchair - manual Additional Comments: Mom, brother, was planned to start HHPT/OT prior to coming back to hospital. Think she was going to get [facility] aide 2x/week as well. Mother has been primary caregiver.    Prior Function Prior Level of Function : Needs assist       Physical Assist : Mobility (physical);ADLs (physical) Mobility (physical): Bed mobility;Transfers ADLs (physical): Bathing;Dressing;Toileting;IADLs Mobility Comments: per pt, she was previously transferring only with +1 assist for safety. has been in bed for 2-3 weeks with recent hospitalizations. pt last walked fall of 2025 short household distances. ADLs Comments: Pt performs all ADLs bed level. Her mom helps her with sponge bathing, uses bed pan or brief. Pt can help bathe UB     Extremity/Trunk Assessment   Upper Extremity  Assessment Upper Extremity Assessment: Generalized weakness;Right hand dominant    Lower Extremity Assessment Lower Extremity Assessment: Generalized weakness (muscle atrophy noted throughout extremities)    Cervical / Trunk Assessment Cervical / Trunk Assessment: Kyphotic  Communication   Communication Communication: No apparent difficulties    Cognition Arousal: Alert Behavior During Therapy: WFL for tasks assessed/performed                             Following commands: Intact       Cueing Cueing Techniques: Verbal cues     General Comments General comments (skin integrity, edema, etc.): O2 stable on 3L throughout. Discussed with MD prior to eval - per MD, may use TLSO for comfort if pt wishes. Educated pt on TLSO and use. Pt does not experience any back pain or discomfort throughout session. Anticipate TLSO would provide more discomfort based on pt's respiratory status, overall body habitus and rib fx.    Exercises     Assessment/Plan    PT Assessment Patient needs continued PT services  PT Problem List Decreased activity tolerance;Decreased mobility;Decreased strength       PT Treatment Interventions Gait training;Therapeutic exercise;Therapeutic activities;Functional mobility training;Patient/family education;DME instruction;Balance training;Wheelchair mobility training    PT Goals (Current goals can be found in the Care Plan section)  Acute Rehab PT Goals Patient Stated Goal: want to get up and walk PT Goal Formulation: With patient Time For Goal Achievement: 11/24/24 Potential to Achieve Goals: Fair    Frequency  Min 3X/week     Co-evaluation   Reason for Co-Treatment: For patient/therapist safety;To address functional/ADL transfers PT goals addressed during session: Balance;Mobility/safety with mobility OT goals addressed during session: ADL's and self-care;Proper use of Adaptive equipment and DME       AM-PAC PT 6 Clicks Mobility   Outcome Measure Help needed turning from your back to your side while in a flat bed without using bedrails?: A Lot Help needed moving from lying on your back to sitting on the side of a flat bed without using bedrails?: Total Help needed moving to and from a bed to a chair (including a wheelchair)?: Total Help needed standing up from a chair using your arms (e.g., wheelchair or bedside chair)?: Total Help needed to walk in hospital room?: Total Help needed climbing 3-5 steps with a railing? : Total 6 Click Score: 7    End of Session Equipment Utilized During Treatment: Gait belt Activity Tolerance: Patient tolerated treatment well Patient left: in bed;with call bell/phone within reach;with bed alarm set Nurse Communication: Mobility status PT Visit Diagnosis: Unsteadiness on feet (R26.81);Muscle weakness (generalized) (M62.81);Other abnormalities of gait and mobility (R26.89)    Time: 8997-8964 PT Time Calculation (min) (ACUTE ONLY): 33 min   Charges:   PT Evaluation $PT Eval Moderate Complexity: 1 Mod PT Treatments $Therapeutic Activity: 8-22 mins PT General Charges $$ ACUTE PT VISIT: 1 Visit         Tinnie BERRY PT, DPT  Acute Rehabilitation Services  Office [phone]  11/10/2024, 4:16 PM

## 2024-11-10 NOTE — Assessment & Plan Note (Addendum)
 Possible pneumonia Treated with 5 days Rocephin  and doxycycline  and diuretics.  Weaned to room air.  Effusions resolved on CXR.

## 2024-11-10 NOTE — Assessment & Plan Note (Addendum)
 T6 compression fracture at the time of diagnosis in 2025.  On recent imaging this appears worse.  Concern for progression and spinal cord impingement.  Thankfully no lower extremity weakness, bowel or bladder dysfunction - Consult radiation oncology - Continue Decadron , begin wean

## 2024-11-10 NOTE — Assessment & Plan Note (Signed)
 stage II left buttock pressure injury present on admission

## 2024-11-10 NOTE — Assessment & Plan Note (Signed)
-   Platelet transfusion for <10K or <50K with bleeding - Trend CBC

## 2024-11-10 NOTE — Assessment & Plan Note (Signed)
 Blood pressure controlled - Continue diltiazem , Lasix 

## 2024-11-10 NOTE — Assessment & Plan Note (Signed)
-   Continue amiodarone  and diltiazem  -Telemetry monitoring - Not on anticoagulation due to thrombocytopenia and history of SAH

## 2024-11-10 NOTE — Assessment & Plan Note (Signed)
 Not on anticoagulation due to history of subarachnoid hemorrhage and thrombocytopenia

## 2024-11-10 NOTE — Evaluation (Signed)
 Occupational Therapy Evaluation Patient Details Name: Carla Hanson MRN: [id number] DOB: 26-Aug-1966 Today's Date: 11/10/2024   History of Present Illness   Pt admitted with acute respiratory failure due to bilateral pleural effusions, T6 compression fx with mild cord compression in the setting of refractory multiple myeloma. Neurosurgery does not recommend surgical intervention. 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 Pt with complex PMH and recent hospitalizations. Pt with noted functional decline ~6 months, recently has been performing BADLs at bed level with mother assisting; can perform UB bathing, grooming and self-feeding. Prior to admission 2-3 weeks ago, pt was able to transfer with +1 assist from bed <> BSC/wheelchair. Pt presents with poor activity tolerance, global weakness with muscle atrophy, decreased cardiopulmonary status, impaired balance, decreased ROM and decreased functional mobility. Pt educated on log roll technique and was able to return demo with MOD A +2. Good static seated balance once at EOB, pt able to stand with MOD A +2 HHA briefly, and is too fatigued to attempt x2. Anticipate pt will require MAX-TOTAL for LB ADLs at bed level. VSS on 3L O2. Pt would benefit from skilled OT services to address noted impairments and functional limitations (see below for any additional details) in order to maximize safety and independence while minimizing falls risk and caregiver burden. Anticipate the need for follow up [facility] OT services upon acute hospital DC.      If plan is discharge home, recommend the following:   Two people to help with walking and/or transfers;A lot of help with  bathing/dressing/bathroom;Help with stairs or ramp for entrance;Assist for transportation     Functional Status Assessment   Patient has had a recent decline in their functional status and demonstrates the ability to make significant improvements in function in a reasonable and predictable amount of time.     Equipment Recommendations   [employer];Hospital bed      Precautions/Restrictions   Precautions Precautions: Fall;Back;Other (comment) Recall of Precautions/Restrictions: Impaired Precaution/Restrictions Comments: spinal precautions for comfort, may use TLSO for comfort per MD (brace not ordered, pt does not experience back pain throughout OT eval) Required Braces or Orthoses: Other Brace (per conversation with MD, pt may use TLSO for comfort. Spinal precautions for comfort.) Restrictions Weight Bearing Restrictions Per Provider Order: No     Mobility Bed Mobility Overal bed mobility: Needs Assistance Bed Mobility: Rolling, Sidelying to Sit, Sit to Sidelying Rolling: Mod assist, +2 for physical assistance, +2 for safety/equipment Sidelying to sit: Mod assist, +2 for physical assistance, +2 for safety/equipment     Sit to sidelying: Mod assist, +2 for physical assistance, +2 for safety/equipment General bed mobility comments: educated on log rolling, use of bed rails, pt with good return demo    Transfers Overall transfer level: Needs assistance Equipment used: 2 person hand held assist Transfers: Sit to/from Stand Sit to Stand: Mod assist, +2 physical assistance, +2 safety/equipment, From elevated surface           General transfer comment: Pt puts forth good effort and is motivated. Able to stand for under 20 seconds before fatiguing. Lateral scooting with MOD A +2 and use of bed pads.      Balance Overall balance assessment: Needs assistance Sitting-balance support: Bilateral upper extremity supported,  Feet supported Sitting balance-Leahy Scale: Fair      Standing balance support: Bilateral upper extremity supported, During functional activity Standing balance-Leahy Scale: Poor Standing balance comment: under 20 seconds standing tolerance                           ADL either performed or assessed with clinical judgement   ADL Overall ADL's : Needs assistance/impaired Eating/Feeding: Sitting;Set up   Grooming: Sitting;Set up   Upper Body Bathing: Sitting;Set up   Lower Body Bathing: Bed level;Maximal assistance   Upper Body Dressing : Set up;Sitting   Lower Body Dressing: Bed level;Maximal assistance     Toilet Transfer Details (indicate cue type and reason): unsafe to attempt Toileting- Clothing Manipulation and Hygiene: Total assistance;Bed level       Functional mobility during ADLs: +2 for physical assistance;+2 for safety/equipment;Moderate assistance General ADL Comments: Pt with severe deconditioning, generalized weakness and and fatigues quickly. Pt was able to stand with +2 MOD A for under 20 seconds. Anticipate need for bed level ADLs.     Vision Baseline Vision/History: 1 Wears glasses Ability to See in Adequate Light: 0 Adequate Patient Visual Report: No change from baseline Vision Assessment?: No apparent visual deficits            Pertinent Vitals/Pain Pain Assessment Pain Assessment: 0-10 Pain Score: 3  Pain Location: L medial side of foot (started at 8/10 with standing attempt, decreased to 3/10 end of session) Pain Descriptors / Indicators: Sore Pain Intervention(s): Limited activity within patient's tolerance, Monitored during session     Extremity/Trunk Assessment Upper Extremity Assessment Upper Extremity Assessment: Generalized weakness;Right hand dominant (muscle atrophy noted throughout extremeties)   Lower Extremity Assessment Lower Extremity Assessment: Generalized weakness   Cervical / Trunk Assessment Cervical / Trunk Assessment: Kyphotic;Other exceptions    Communication Communication Communication: No apparent difficulties   Cognition Arousal: Alert Behavior During Therapy: WFL for tasks assessed/performed Cognition: No apparent impairments                               Following commands: Intact       Cueing  General Comments   Cueing Techniques: Verbal cues  O2 stable on 3L throughout. Discussed with MD prior to eval - per MD, may use TLSO for comfort if pt wishes. Educated pt on TLSO and use. Pt does not experience any back pain or discomfort throughout session. Anticipate TLSO would provide more discomfort based on pt's respiratory status, overall body habitus and rib fx.           Home Living Family/patient expects to be discharged to:: Private residence Living Arrangements: Alone Available Help at Discharge: Family;Available 24 hours/day Type of Home: Apartment Home Access: Level entry     Home Layout: One level     Bathroom Shower/Tub: Tub/shower unit;Sponge bathes at baseline   [employer]: Standard     Home Equipment: Rollator (4 wheels);Tub bench;Grab bars - tub/shower;Hand held shower head;BSC/3in1;Rolling Walker (2 wheels);Wheelchair - manual   Additional Comments: Mom, brother, was planned to start HHPT/OT prior to coming back to hospital. Think she was going to get [facility] aide 2x/week as well.      Prior Functioning/Environment Prior Level of Function : Needs assist       Physical Assist : Mobility (physical);ADLs (physical) Mobility (physical): Bed mobility;Transfers ADLs (physical): Bathing;Dressing;Toileting;IADLs Mobility Comments: per pt, she was previously transferring only  with +1-2 assist. has been in bed for 2-3 weeks with recent hospitalizations. pt last walked fall of 2025 short household distances. ADLs Comments: Pt performs all ADLs bed level. Her mom helps her with sponge bathing, uses bed pan or brief. Pt can help bathe UB    OT Problem List: Decreased strength;Impaired  balance (sitting and/or standing);Decreased activity tolerance;Obesity;Cardiopulmonary status limiting activity   OT Treatment/Interventions: Self-care/ADL training;Therapeutic exercise;Energy conservation;DME and/or AE instruction;Therapeutic activities;Patient/family education;Balance training      OT Goals(Current goals can be found in the care plan section)   Acute Rehab OT Goals OT Goal Formulation: With patient Time For Goal Achievement: 11/24/24 Potential to Achieve Goals: Fair   OT Frequency:  Min 2X/week    Co-evaluation PT/OT/SLP Co-Evaluation/Treatment: Yes Reason for Co-Treatment: For patient/therapist safety;To address functional/ADL transfers PT goals addressed during session: Balance;Mobility/safety with mobility OT goals addressed during session: ADL's and self-care;Proper use of Adaptive equipment and DME      AM-PAC OT 6 Clicks Daily Activity     Outcome Measure Help from another person eating meals?: None Help from another person taking care of personal grooming?: A Little Help from another person toileting, which includes using toliet, bedpan, or urinal?: Total Help from another person bathing (including washing, rinsing, drying)?: A Lot Help from another person to put on and taking off regular upper body clothing?: A Little Help from another person to put on and taking off regular lower body clothing?: A Lot 6 Click Score: 15   End of Session Equipment Utilized During Treatment: Gait belt;Oxygen Nurse Communication: Mobility status  Activity Tolerance: Patient tolerated treatment well Patient left: in bed;with call bell/phone within reach;with bed alarm set;with nursing/sitter in room  OT Visit Diagnosis: Other abnormalities of gait and mobility (R26.89);Muscle weakness (generalized) (M62.81);Unsteadiness on feet (R26.81)                Time: 8997-8964 OT Time Calculation (min): 33 min Charges:  OT General Charges $OT Visit: 1 Visit OT  Evaluation $OT Eval Moderate Complexity: 1 Mod  Zyheir Daft L. Nehemiah Montee, OTR/L  11/10/2024, 1:57 PM

## 2024-11-10 NOTE — Assessment & Plan Note (Addendum)
 Completed 7 days calcitonin, asymptomatic

## 2024-11-10 NOTE — Assessment & Plan Note (Signed)
-   Supplement potassium

## 2024-11-10 NOTE — Progress Notes (Signed)
" °  Progress Note   Patient: Carla Hanson DOB: Aug 28, 1966 DOA: 11/07/2024     3 DOS: the patient was seen and examined on 11/10/2024 at 11:26 AM      Brief hospital course: 59 y.o. F with obesity, HTN, COPD, hx DVT and SAH no longer on AC, and relapsed refractory MM who presented with shortness of breath found to have pleural effusions, worsening T6 compression fracture.     Assessment and Plan: * Acute respiratory failure with hypoxia (HCC) Possible pneumonia - Wean oxygen as able - Continue doxycycline  and Rocephin  - Continue diuretics - US  thoracentesis if able    Compression fracture of T6 vertebra (HCC) T6 compression fracture at the time of diagnosis in 2025.  On recent imaging this appears worse.  Concern for progression and spinal cord impingement.  Thankfully no lower extremity weakness, bowel or bladder dysfunction - Consult radiation oncology - Continue Decadron   Thrombocytopenia - Platelet transfusion for <10K or <50K with bleeding - Trend CBC  Atrial flutter (HCC) - Continue amiodarone  and diltiazem  -Telemetry monitoring - Not on anticoagulation due to thrombocytopenia and history of SAH  Pleural effusion - Continue supplemental oxygen - Trend platelets and reconsider thoracentesis  Pressure injury of skin stage II left buttock pressure injury present on admission   Acute on chronic heart failure with preserved ejection fraction (HFpEF) (HCC) - Continue IV Lasix  and potassium  Multiple myeloma not having achieved remission (HCC) - Consult hematology and radiation oncology  Hypercalcemia of malignancy - Continue calcitonin  Hypokalemia - Supplement potassium  COPD, moderate (HCC) No active symptoms - Continue antibiotics - Albuterol  as needed - Continue ICS/LABA  Iron deficiency anemia Hemoglobin remained stable - Trend CBC - Continue ferrous sulfate   History of pulmonary embolism Not on anticoagulation due to history of  subarachnoid hemorrhage and thrombocytopenia  Essential hypertension Blood pressure controlled - Continue diltiazem , Lasix   Class III obesity Complicates care        Subjective: Patient reports no bleeding, no nursing reports of bleeding.  She is generally tired.  She does not have any significant back pain.  Her breathing is now improved.  Planning to go for CT simulation and first radiation therapy treatment this afternoon.     Physical Exam: BP 129/71 (BP Location: Right Arm)   Pulse 75   Temp 98.4 F (36.9 C) (Oral)   Resp 14   Ht 5' 1.5 (1.562 m)   Wt 92.6 kg   LMP  (LMP Unknown)   SpO2 97%   BMI 37.95 kg/m   Obese adult female, lying in bed, appears tired, on nasal cannula RRR, regular, no murmurs, no peripheral pitting Respiratory rate increased, severe shortness of breath at rest, lung sounds diminished, no rales or wheezes Attention normal, affect normal, oriented x 3, face symmetric, speech fluent, generalized weakness, no sensory deficits in the upper or lower extremities bilaterally.  4/4 strength in bilateral lower extremities.    Data Reviewed: Basic metabolic panel shows normal electrolytes and renal function CBC shows anemia and stable thrombocytopenia    Family Communication: Mother at the bedside    Disposition: Status is: Inpatient         Author: [name], MD 11/10/2024 2:40 PM  For on call review [email or web address].    "

## 2024-11-10 NOTE — Hospital Course (Signed)
 59 y.o. F with obesity, HTN, COPD, hx DVT and SAH no longer on AC, and relapsed refractory MM who presented with shortness of breath found to have pleural effusions, worsening T6 compression fracture.

## 2024-11-10 NOTE — Plan of Care (Signed)

## 2024-11-10 NOTE — Assessment & Plan Note (Addendum)
 Small, resolved with diuresis

## 2024-11-10 NOTE — Assessment & Plan Note (Addendum)
 Treated with IV Lasix  and resolved. Not on diuretics as an outpatient.

## 2024-11-11 ENCOUNTER — Ambulatory Visit

## 2024-11-11 ENCOUNTER — Other Ambulatory Visit: Payer: Self-pay

## 2024-11-11 DIAGNOSIS — J9601 Acute respiratory failure with hypoxia: Secondary | ICD-10-CM | POA: Diagnosis not present

## 2024-11-11 LAB — RAD ONC ARIA SESSION SUMMARY
Course Elapsed Days: 1
Plan Fractions Treated to Date: 2
Plan Prescribed Dose Per Fraction: 2.5 Gy
Plan Total Fractions Prescribed: 10
Plan Total Prescribed Dose: 25 Gy
Reference Point Dosage Given to Date: 5 Gy
Reference Point Session Dosage Given: 2.5 Gy
Session Number: 2

## 2024-11-11 LAB — CBC
HCT: 23.4 % — ABNORMAL LOW (ref 36.0–46.0)
Hemoglobin: 7.4 g/dL — ABNORMAL LOW (ref 12.0–15.0)
MCH: 28.1 pg (ref 26.0–34.0)
MCHC: 31.6 g/dL (ref 30.0–36.0)
MCV: 89 fL (ref 80.0–100.0)
Platelets: 16 K/uL — CL (ref 150–400)
RBC: 2.63 MIL/uL — ABNORMAL LOW (ref 3.87–5.11)
RDW: 15.7 % — ABNORMAL HIGH (ref 11.5–15.5)
WBC: 12 K/uL — ABNORMAL HIGH (ref 4.0–10.5)
nRBC: 14.2 % — ABNORMAL HIGH (ref 0.0–0.2)

## 2024-11-11 LAB — COMPREHENSIVE METABOLIC PANEL WITH GFR
ALT: 8 U/L (ref 0–44)
AST: 45 U/L — ABNORMAL HIGH (ref 15–41)
Albumin: 3 g/dL — ABNORMAL LOW (ref 3.5–5.0)
Alkaline Phosphatase: 142 U/L — ABNORMAL HIGH (ref 38–126)
Anion gap: 7 (ref 5–15)
BUN: 19 mg/dL (ref 6–20)
CO2: 32 mmol/L (ref 22–32)
Calcium: 9.3 mg/dL (ref 8.9–10.3)
Chloride: 101 mmol/L (ref 98–111)
Creatinine, Ser: 0.47 mg/dL (ref 0.44–1.00)
GFR, Estimated: >60 mL/min
Glucose, Bld: 173 mg/dL — ABNORMAL HIGH (ref 70–99)
Potassium: 3.4 mmol/L — ABNORMAL LOW (ref 3.5–5.1)
Sodium: 140 mmol/L (ref 135–145)
Total Bilirubin: 0.3 mg/dL (ref 0.0–1.2)
Total Protein: 6.7 g/dL (ref 6.5–8.1)

## 2024-11-11 MED ORDER — POTASSIUM CHLORIDE CRYS ER 20 MEQ PO TBCR
40.0000 meq | EXTENDED_RELEASE_TABLET | Freq: Once | ORAL | Status: AC
  Administered 2024-11-11: 40 meq via ORAL
  Filled 2024-11-11: qty 2

## 2024-11-11 NOTE — Progress Notes (Signed)
" °  Progress Note   Patient: Carla Hanson FMW:[id number] DOB: 1966-02-06 DOA: 11/07/2024     4 DOS: the patient was seen and examined on 11/11/2024 at 9:20 AM      Brief hospital course: 59 y.o. F with obesity, HTN, COPD, hx DVT and SAH no longer on AC, and relapsed refractory MM who presented with shortness of breath found to have pleural effusions, worsening T6 compression fracture.     Assessment and Plan: * Acute respiratory failure with hypoxia (HCC) Possible pneumonia Still requiring oxygen. - Continue Rocephin  and doxycycline , day 4 of 5 - Continue diuretics - Wean oxygen as able     Compression fracture of T6 vertebra (HCC) See summary from 1/7.  Went for first radiation treatment yesterday - Consult radiation oncology - Continue Decadron   Thrombocytopenia Platelets stable at 16K today - Platelet transfusion for <10K or <50K with bleeding - Trend CBC  Atrial flutter (HCC) Not on anticoagulation due to history of SAH and thrombocytopenia - Continue amiodarone  and diltiazem  -Telemetry monitoring  Small bilateral pleural effusions We are able to wean her oxygen  Pressure injury of skin stage II left buttock pressure injury present on admission   Acute on chronic heart failure with preserved ejection fraction (HFpEF) (HCC) Creatinine stable, potassium trending down.  Net -1.4 L yesterday, 5.6 on admission - Wean oxygen as able - Continue IV Lasix  - Daily BMP  Multiple myeloma not having achieved remission (HCC) - Consult hematology and radiation oncology  Hypercalcemia of malignancy - Continue calcitonin  Hypokalemia - Supplement potassium  COPD, moderate (HCC) No active symptoms - Continue antibiotics - Albuterol  as needed - Continue ICS/LABA  Iron deficiency anemia Hemoglobin remained stable - Trend CBC - Continue ferrous sulfate   History of pulmonary embolism Not on anticoagulation due to history of subarachnoid hemorrhage and  thrombocytopenia  Essential hypertension Blood pressure controlled - Continue diltiazem , Lasix           Subjective: Patient doing okay, she is feeling all right, dyspnea has improved.  Had her first radiation treatment yesterday, second treatment today.     Physical Exam: BP 134/63 (BP Location: Left Leg)   Pulse 70   Temp 98 F (36.7 C)   Resp 18   Ht 5' 1.5 (1.562 m)   Wt 92.6 kg   LMP  (LMP Unknown)   SpO2 95%   BMI 37.95 kg/m   Obese adult female, sitting up in bed, interactive and appropriate RRR, no murmurs, no peripheral edema Respiratory rate normal, lungs with good air movement bilaterally, no rales or wheezes Abdomen soft, no tenderness palpation Attention normal, affect normal, judgment insight appear normal, upper extremity strength normal   Data Reviewed: Basic metabolic panel shows mild hypokalemia CBC shows mild leukocytosis, stable anemia, platelets up to 16K    Family Communication:     Disposition: Status is: Inpatient         Author: [name], MD 11/11/2024 12:36 PM  For on call review [email or web address]    "

## 2024-11-11 NOTE — Plan of Care (Signed)

## 2024-11-11 NOTE — TOC Progression Note (Signed)
 Transition of Care [facility]) - Progression Note    Patient Details  Name: Carla Hanson MRN: [id number] Date of Birth: [birth year]  [facility] CM/SW Contact  Toy LITTIE Agar, RN Phone Number:[phone]  11/11/2024, 1:47 PM  Clinical Narrative:    CM has confirmed that patient is active with [facility] for [facility] needs.    Expected Discharge Plan: Home w Home Health Services Barriers to Discharge: Continued Medical Work up               Expected Discharge Plan and Services In-house Referral: NA Discharge Planning Services: CM Consult Post Acute Care Choice:  (patient states that she has HH PT /OT/RN/ (BSC , shower chair, rollator, walker, wheelchair)) Living arrangements for the past 2 months: Single Family Home                 DME Arranged: N/A DME Agency: NA       HH Arranged: NA HH Agency: NA         Social Drivers of Health (SDOH) Interventions SDOH Screenings   Food Insecurity: No Food Insecurity (11/07/2024)  Housing: Low Risk (11/07/2024)  Transportation Needs: No Transportation Needs (11/07/2024)  Utilities: Not At Risk (11/07/2024)  Depression (PHQ2-9): Low Risk (09/08/2024)  Financial Resource Strain: Low Risk (05/14/2024)  Physical Activity: Insufficiently Active (05/14/2024)  Social Connections: Moderately Integrated (10/20/2024)  Stress: No Stress Concern Present (05/14/2024)  Tobacco Use: Medium Risk (11/08/2024)    Readmission Risk Interventions    11/09/2024    3:02 PM 10/26/2024    2:05 PM 02/03/2024    1:14 PM  Readmission Risk Prevention Plan  Transportation Screening Complete Complete Complete  PCP or Specialist Appt within 3-5 Days Complete Complete Complete  HRI or Home Care Consult Complete Complete   Social Work Consult for Recovery Care Planning/Counseling Complete Complete Complete  Palliative Care Screening Not Applicable  Not Applicable  Medication Review Oceanographer) Referral to Pharmacy Complete Complete

## 2024-11-11 NOTE — Assessment & Plan Note (Signed)
 SABRA

## 2024-11-12 ENCOUNTER — Ambulatory Visit

## 2024-11-12 ENCOUNTER — Other Ambulatory Visit: Payer: Self-pay

## 2024-11-12 DIAGNOSIS — I517 Cardiomegaly: Secondary | ICD-10-CM

## 2024-11-12 DIAGNOSIS — J9601 Acute respiratory failure with hypoxia: Secondary | ICD-10-CM | POA: Diagnosis not present

## 2024-11-12 DIAGNOSIS — M4854XA Collapsed vertebra, not elsewhere classified, thoracic region, initial encounter for fracture: Secondary | ICD-10-CM

## 2024-11-12 DIAGNOSIS — G893 Neoplasm related pain (acute) (chronic): Secondary | ICD-10-CM

## 2024-11-12 LAB — COMPREHENSIVE METABOLIC PANEL WITH GFR
ALT: 9 U/L (ref 0–44)
AST: 37 U/L (ref 15–41)
Albumin: 3.2 g/dL — ABNORMAL LOW (ref 3.5–5.0)
Alkaline Phosphatase: 143 U/L — ABNORMAL HIGH (ref 38–126)
Anion gap: 7 (ref 5–15)
BUN: 18 mg/dL (ref 6–20)
CO2: 32 mmol/L (ref 22–32)
Calcium: 9.1 mg/dL (ref 8.9–10.3)
Chloride: 100 mmol/L (ref 98–111)
Creatinine, Ser: 0.45 mg/dL (ref 0.44–1.00)
GFR, Estimated: >60 mL/min
Glucose, Bld: 158 mg/dL — ABNORMAL HIGH (ref 70–99)
Potassium: 4.5 mmol/L (ref 3.5–5.1)
Sodium: 140 mmol/L (ref 135–145)
Total Bilirubin: 0.3 mg/dL (ref 0.0–1.2)
Total Protein: 7.1 g/dL (ref 6.5–8.1)

## 2024-11-12 LAB — CBC
HCT: 26.7 % — ABNORMAL LOW (ref 36.0–46.0)
HCT: 26 % — ABNORMAL LOW (ref 36.0–46.0)
Hemoglobin: 8.4 g/dL — ABNORMAL LOW (ref 12.0–15.0)
Hemoglobin: 7.9 g/dL — ABNORMAL LOW (ref 12.0–15.0)
MCH: 27.5 pg (ref 26.0–34.0)
MCH: 27.5 pg (ref 26.0–34.0)
MCHC: 31.5 g/dL (ref 30.0–36.0)
MCHC: 30.4 g/dL (ref 30.0–36.0)
MCV: 87.3 fL (ref 80.0–100.0)
MCV: 90.6 fL (ref 80.0–100.0)
Platelets: 15 K/uL — CL (ref 150–400)
Platelets: 17 K/uL — CL (ref 150–400)
RBC: 3.06 MIL/uL — ABNORMAL LOW (ref 3.87–5.11)
RBC: 2.87 MIL/uL — ABNORMAL LOW (ref 3.87–5.11)
RDW: 16 % — ABNORMAL HIGH (ref 11.5–15.5)
RDW: 15.7 % — ABNORMAL HIGH (ref 11.5–15.5)
WBC: 11.7 K/uL — ABNORMAL HIGH (ref 4.0–10.5)
WBC: 13.7 K/uL — ABNORMAL HIGH (ref 4.0–10.5)
nRBC: 7.3 % — ABNORMAL HIGH (ref 0.0–0.2)
nRBC: 18.1 % — ABNORMAL HIGH (ref 0.0–0.2)

## 2024-11-12 LAB — CBC WITH DIFFERENTIAL/PLATELET
Abs Immature Granulocytes: 1.54 K/uL — ABNORMAL HIGH (ref 0.00–0.07)
Basophils Absolute: 0.1 K/uL (ref 0.0–0.1)
Basophils Relative: 1 %
Eosinophils Absolute: 0.2 K/uL (ref 0.0–0.5)
Eosinophils Relative: 2 %
HCT: 26.6 % — ABNORMAL LOW (ref 36.0–46.0)
Hemoglobin: 8.3 g/dL — ABNORMAL LOW (ref 12.0–15.0)
Immature Granulocytes: 13 %
Lymphocytes Relative: 26 %
Lymphs Abs: 3.2 K/uL (ref 0.7–4.0)
MCH: 27.7 pg (ref 26.0–34.0)
MCHC: 31.2 g/dL (ref 30.0–36.0)
MCV: 88.7 fL (ref 80.0–100.0)
Monocytes Absolute: 2.4 K/uL — ABNORMAL HIGH (ref 0.1–1.0)
Monocytes Relative: 19 %
Neutro Abs: 4.9 K/uL (ref 1.7–7.7)
Neutrophils Relative %: 39 %
Platelets: 18 K/uL — CL (ref 150–400)
RBC: 3 MIL/uL — ABNORMAL LOW (ref 3.87–5.11)
RDW: 16.3 % — ABNORMAL HIGH (ref 11.5–15.5)
Smear Review: NORMAL
WBC: 12.3 K/uL — ABNORMAL HIGH (ref 4.0–10.5)
nRBC: 8 % — ABNORMAL HIGH (ref 0.0–0.2)

## 2024-11-12 LAB — RAD ONC ARIA SESSION SUMMARY
Course Elapsed Days: 2
Plan Fractions Treated to Date: 3
Plan Prescribed Dose Per Fraction: 2.5 Gy
Plan Total Fractions Prescribed: 10
Plan Total Prescribed Dose: 25 Gy
Reference Point Dosage Given to Date: 7.5 Gy
Reference Point Session Dosage Given: 2.5 Gy
Session Number: 3

## 2024-11-12 MED ORDER — BISACODYL 5 MG PO TBEC
5.0000 mg | DELAYED_RELEASE_TABLET | Freq: Every day | ORAL | Status: DC | PRN
  Administered 2024-11-21: 5 mg via ORAL
  Filled 2024-11-12: qty 1

## 2024-11-12 MED ORDER — POLYETHYLENE GLYCOL 3350 17 G PO PACK
17.0000 g | PACK | Freq: Every day | ORAL | Status: DC
  Administered 2024-11-13 – 2024-11-24 (×7): 17 g via ORAL
  Filled 2024-11-12 (×11): qty 1

## 2024-11-12 NOTE — Plan of Care (Signed)

## 2024-11-12 NOTE — Progress Notes (Signed)
 "                                                                                                                                                                                                          Daily Progress Note   Patient Name: Carla Hanson       Date: 11/12/2024 DOB: Mar 09, 1966  Age: 59 y.o. MRN#: [id number] Attending Physician: Jonel Lonni SQUIBB, * Primary Care Physician: Anders Otto DASEN, MD Admit Date: 11/07/2024  Reason for Consultation/Follow-up: Establishing goals of care  Subjective: Patient is awake alert resting in bed complains of abdominal discomfort, thinks it could be from constipation.  We discussed about adding a bowel regimen. She states that she is tolerating radiation treatments well Length of Stay: 5  Current Medications: Scheduled Meds:   acyclovir   400 mg Oral BID   amiodarone   200 mg Oral BID   calcitonin (salmon)  1 spray Alternating Nares Daily   dexamethasone   4 mg Oral Q6H   diltiazem   240 mg Oral Daily   ferrous sulfate   325 mg Oral Q breakfast   fluticasone  furoate-vilanterol  1 puff Inhalation Daily   folic acid   1 mg Oral BID   furosemide   40 mg Intravenous Daily   polyethylene glycol  17 g Oral Daily   potassium chloride   20 mEq Oral Daily   thiamine   100 mg Oral Daily    Continuous Infusions:    PRN Meds: acetaminophen  **OR** acetaminophen , albuterol , alum & mag hydroxide-simeth, bisacodyl , HYDROmorphone  (DILAUDID ) injection  Physical Exam         Weak appearing lady resting in bed Mild generalized edema upper lower extremities Regular work of breathing Abdomen not distended  Vital Signs: BP 127/69   Pulse 70   Temp 98 F (36.7 C) (Oral)   Resp 18   Ht 5' 1.5 (1.562 m)   Wt 92.6 kg   LMP  (LMP Unknown)   SpO2 100%   BMI 37.95 kg/m  SpO2: SpO2: 100 % O2 Device: O2 Device: Nasal Cannula O2 Flow Rate: O2 Flow Rate (L/min): (S) 3 L/min (down to 1 lpm post tx- rn aware)  Intake/output summary:  Intake/Output  Summary (Last 24 hours) at 11/12/2024 1202 Last data filed at 11/12/2024 1010 Gross per 24 hour  Intake 360 ml  Output 2150 ml  Net -1790 ml   LBM: Last BM Date : 11/12/24 Baseline Weight: Weight: 92.6 kg Most recent weight: Weight: 92.6 kg       Palliative Assessment/Data:      Patient Active Problem  List   Diagnosis Date Noted   Pressure injury of skin 11/10/2024   Atrial flutter (HCC) 11/10/2024   Compression fracture of T6 vertebra (HCC) 11/08/2024   Acute on chronic heart failure with preserved ejection fraction (HFpEF) (HCC) 11/08/2024   Acute respiratory failure with hypoxia (HCC) 11/07/2024   Pleural effusion 11/07/2024   Need for emotional support 11/03/2024   Pain 11/02/2024   Counseling and coordination of care 11/02/2024   Goals of care, counseling/discussion 11/02/2024   Palliative care encounter 11/02/2024   Thrombocytopenia 10/23/2024   Anemia due to antineoplastic agent 10/21/2024   Counseling regarding advance care planning and goals of care 10/21/2024   Symptomatic anemia 10/20/2024   Moderate protein-calorie malnutrition 09/21/2024   Chemotherapy-induced neutropenia 09/09/2024   Folate deficiency 09/09/2024   Pancytopenia (HCC) 09/08/2024   History of compression fracture of spine 04/12/2024   Electrolyte abnormality 02/10/2024   Edema leg 02/10/2024   Hypercalcemia of malignancy 02/02/2024   Multiple myeloma not having achieved remission (HCC) 02/02/2024   Hypokalemia 01/30/2024   BMI 40.0-44.9, adult (HCC) 12/18/2022   Chronic pain of both shoulders 09/05/2022   Vitamin D  deficiency 12/19/2020   Palpitation 12/03/2019   History of intracranial hemorrhage 08/02/2019   History of DVT of lower extremity 08/02/2019   History of seizure 08/02/2019   COPD, moderate (HCC) 12/11/2018   Anemia 02/11/2018   History of hemorrhagic cerebrovascular accident (CVA) without residual deficits 08/13/2017   Iron deficiency anemia 12/12/2016   Pre-diabetes  03/02/2013   History of pulmonary embolism 05/05/2012   Former tobacco use 08/23/2009   Hyperlipidemia 04/30/2007   Essential hypertension 01/01/2007    Palliative Care Assessment & Plan   59 year old lady with refractory IgG lambda multiple myeloma, follows with Dr. Onesimo at [facility], admitted with weakness to the hospital medicine service, also seen by radiation oncology for T6 pathologic compression fracture Patient is status post multiple lines of chemotherapy She is in the process of being referred to [facility] for consideration of bite therapy Patient was admitted from October 20, 2024 through November 05, 2024 for symptomatic anemia that required 5 units of PRBC transfusion.  The stay was also significant for new onset A-fib and patient was placed on amiodarone  and Cardizem  Recent imaging has not shown that the patient has extensive osseous lytic disease with multiple bilateral rib fractures severe pathologic compression fracture of T6 suspected liver metastasis cardiomegaly, MRI thoracic spine on 11-08-2024 also showed extensive enhancing epidural paraspinous disease resulting in multilevel spinal canal stenosis.  1.  Pain-patient has Decadron  and IV hydromorphone  available for pain, started on radiation from 11-10-2024 after simulation.  Patient states radiation appointments are going well 2.  CODE STATUS and goals of care discussions: Full code.  Care for now Patient wishes for transfer to [facility] for further treatments for her life-limiting illness Currently admitted to the hospital and undergoing radiation treatments 3.  Abdominal pain and constipation Add bowel regimen and monitor    Goals of Care and Additional Recommendations: Limitations on Scope of Treatment: Full Scope Treatment  Code Status:    Code Status Orders  (From admission, onward)           Start     Ordered   11/07/24 2056  Full code  Continuous       Question:  By:  Answer:   Consent: discussion documented in EHR   11/07/24 2056           Code Status History  Date Active Date Inactive Code Status Order ID Comments User Context   10/20/2024 1050 11/05/2024 2300 Full Code [id number]  Zella Katha HERO, MD ED   09/08/2024 1708 09/12/2024 1738 Full Code [id number]  Sebastian Toribio GAILS, MD ED   01/30/2024 2047 02/05/2024 2004 Full Code [id number]  Tobie Jorie SAUNDERS, MD Inpatient   11/08/2016 2132 11/13/2016 2053 Full Code [id number]  Michaela Aisha SQUIBB, MD ED   10/31/2016 2025 11/03/2016 1615 Full Code [id number]  Drusilla Sabas RAMAN, MD Inpatient   05/05/2012 0158 05/05/2012 2331 Full Code [id number]  Ritch, [place] BIRCH, MD Inpatient       Prognosis:  Unable to determine But does appear guarded given extensive nature of serious illness. Discharge Planning: Home with Palliative Services Also recommend palliative care follow-up at [facility] Care plan was discussed with patient and patient's RN  Thank you for allowing the Palliative Medicine Team to assist in the care of this patient. I personally spent a total of 35 minutes in the care of the patient today including preparing to see the patient, getting/reviewing separately obtained history, performing a medically appropriate exam/evaluation, counseling and educating, referring and communicating with other health care professionals, documenting clinical information in the EHR, and communicating results.      Greater than 50%  of this time was spent counseling and coordinating care related to the above assessment and plan.  Lonia Serve, MD  Please contact Palliative Medicine Team phone at [phone] for questions and concerns.       "

## 2024-11-12 NOTE — Progress Notes (Signed)
" °  Progress Note   Patient: Carla Hanson FMW:[id number] DOB: Dec 21, 1965 DOA: 11/07/2024     5 DOS: the patient was seen and examined on 11/12/2024 at 10:30 AM      Brief hospital course: 59 y.o. F with obesity, HTN, COPD, hx DVT and SAH no longer on AC, and relapsed refractory MM who presented with shortness of breath found to have pleural effusions, worsening T6 compression fracture.     Assessment and Plan: * Acute respiratory failure with hypoxia (HCC) Possible pneumonia Completed 5 days Rocephin  and doxycycline .  Repeat chest x-ray showed improved pleural effusions.  Still on oxygen due to atelectasis, limited pulmonary toilet. - Pulmonary toilet - Continue diuretics     Compression fracture of T6 vertebra (HCC) See prior summary - Continue dexamethasone  - Daily radiation until 1/20  Thrombocytopenia Platelets up to 18K today, no bleeding observed or reported  Atrial flutter (HCC) - Continue amiodarone  and diltiazem  -Telemetry monitoring - Not on anticoagulation due to thrombocytopenia and history of SAH  Pressure injury of skin stage II left buttock pressure injury present on admission   Acute on chronic heart failure with preserved ejection fraction (HFpEF) (HCC) Still on oxygen, no evidence of swelling but exam limited by physical habitus.  Chest x-ray showed persistent pleural effusions a few days ago - Strict ins and outs - Continue IV Lasix  - Continue potassium  Multiple myeloma not having achieved remission (HCC) - Consult hematology and radiation oncology  Hypercalcemia of malignancy - Continue calcitonin  Hypokalemia - Supplement potassium  BMI 40.0-44.9, adult (HCC)    COPD, moderate (HCC) No active symptoms - Continue antibiotics - Albuterol  as needed - Continue ICS/LABA  Iron deficiency anemia Hemoglobin remained stable - Trend CBC - Continue ferrous sulfate   History of pulmonary embolism Not on anticoagulation due to history of  subarachnoid hemorrhage and thrombocytopenia  Essential hypertension Blood pressure controlled - Continue diltiazem , Lasix           Subjective: Patient worked with physical therapy today, required 2+ assistance plus a left in order to stand.  No fever, no respiratory distress.  No evidence of bleeding.     Physical Exam: BP 116/61 (BP Location: Right Arm)   Pulse 64   Temp 98.2 F (36.8 C) (Oral)   Resp 18   Ht 5' 1.5 (1.562 m)   Wt 92.6 kg   LMP  (LMP Unknown)   SpO2 98%   BMI 37.95 kg/m   Obese adult female, lying in bed, appears weak and tired RRR, no murmurs, no pitting in the extremities Respiratory rate seems normal, good air movement, diminished at bases, no rales or wheezes appreciated Abdomen soft, no tenderness palpation or guarding Attention normal, affect pleasant, judgment and insight appear normal, oriented x 3, upper extremity strength normal    Data Reviewed: Basic metabolic panel shows normal electrolytes and renal function CBC shows stable anemia, stable thrombocytopenia     Family Communication:     Disposition: Status is: Inpatient         Author: [name], MD 11/12/2024 4:16 PM  For on call review [email or web address]    "

## 2024-11-12 NOTE — Plan of Care (Signed)
  Problem: Education: Goal: Knowledge of General Education information will improve Description: Including pain rating scale, medication(s)/side effects and non-pharmacologic comfort measures Outcome: Progressing   Problem: Health Behavior/Discharge Planning: Goal: Ability to manage health-related needs will improve Outcome: Progressing   Problem: Clinical Measurements: Goal: Ability to maintain clinical measurements within normal limits will improve Outcome: Progressing Goal: Will remain free from infection Outcome: Progressing Goal: Cardiovascular complication will be avoided Outcome: Progressing   Problem: Activity: Goal: Risk for activity intolerance will decrease Outcome: Progressing   Problem: Nutrition: Goal: Adequate nutrition will be maintained Outcome: Progressing   Problem: Elimination: Goal: Will not experience complications related to urinary retention Outcome: Progressing   Problem: Pain Managment: Goal: General experience of comfort will improve and/or be controlled Outcome: Progressing   Problem: Safety: Goal: Ability to remain free from injury will improve Outcome: Progressing

## 2024-11-12 NOTE — Progress Notes (Cosign Needed Addendum)
 Occupational Therapy Treatment Patient Details Name: Carla Hanson MRN: [id number] DOB: 27-Feb-1966 Today's Date: 11/12/2024   History of present illness Pt admitted with acute respiratory failure due to bilateral pleural effusions, T6 compression fx with mild cord compression in the setting of refractory multiple myeloma. Neurosurgery does not recommend surgical intervention. 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 PMHx includes HTN, HLD, LLE DVT, CVA w/o residual deficits, AFIB, bronchitis, PNA, COPD, PE, cystectomy, multiple myeloma (currently under tx), OA, anxiety, bipolar   OT comments  Pt. Seen for skilled OT treatment session with focus on use of A/E for LB ADL completion.  Pt. Provided with reacher, sock aide, LH sponge and shoe horn.  Able to return demo of use. Reports she has a 3n1 at home. Her goal is to get stronger and decrease level of assistance needed and then return home.  Per conversation with PT pt. Required MOD A +2 in conjunction with use of STEDY ambulation/transfer equipment.  Pt. Reports she does not have family available that can consistently provide this level of A.  Pt. Will benefit from <3hrs/day of continued therapies prior to home. Will alert OTR/L of need to update d/c recommendations and any goal modification needed.        If plan is discharge home, recommend the following:  Two people to help with walking and/or transfers;A lot of help with bathing/dressing/bathroom;Help with stairs or ramp for entrance;Assist for transportation   Equipment Recommendations  [place] lift;Hospital bed    Recommendations for Other Services      Precautions / Restrictions Precautions Precautions: Fall;Back;Other (comment) Recall of Precautions/Restrictions: Impaired Precaution/Restrictions Comments: spinal precautions for comfort, may use TLSO for comfort per MD (brace not ordered, pt does not  experience back pain throughout eval)       Mobility Bed Mobility               General bed mobility comments: seated in recliner at beg/end of session    Transfers                         Balance                                           ADL either performed or assessed with clinical judgement   ADL Overall ADL's : Needs assistance/impaired             Lower Body Bathing: With adaptive equipment Lower Body Bathing Details (indicate cue type and reason): provided LH sponge for LB bathing     Lower Body Dressing: Cueing for sequencing;With adaptive equipment;Sitting/lateral leans;Set up;Minimal assistance Lower Body Dressing Details (indicate cue type and reason): initial MIN A for 1st use with reacher and sock aide with pt. progressing to set up for 2nd LE after introduction with 1st LE   Toilet Transfer Details (indicate cue type and reason): pt. reports she has 3n1 at home                Extremity/Trunk Assessment              Vision       Perception     Praxis     Communication Communication Communication: No apparent difficulties   Cognition   Behavior During Therapy: [facility] for tasks assessed/performed Cognition: No apparent impairments  Following commands: Intact        Cueing   Cueing Techniques: Verbal cues  Exercises      Shoulder Instructions       General Comments      Pertinent Vitals/ Pain       Pain Assessment Pain Assessment: No/denies pain  Home Living                                          Prior Functioning/Environment              Frequency  Min 2X/week        Progress Toward Goals  OT Goals(current goals can now be found in the care plan section)  Progress towards OT goals: Progressing toward goals     Plan      Co-evaluation                 AM-PAC OT 6 Clicks Daily Activity     Outcome  Measure   Help from another person eating meals?: None Help from another person taking care of personal grooming?: A Little Help from another person toileting, which includes using toliet, bedpan, or urinal?: Total Help from another person bathing (including washing, rinsing, drying)?: A Lot Help from another person to put on and taking off regular upper body clothing?: A Little Help from another person to put on and taking off regular lower body clothing?: A Lot 6 Click Score: 15    End of Session Equipment Utilized During Treatment: Other (comment) (A/E)  OT Visit Diagnosis: Other abnormalities of gait and mobility (R26.89);Muscle weakness (generalized) (M62.81);Unsteadiness on feet (R26.81)   Activity Tolerance Patient tolerated treatment well   Patient Left in chair;with call bell/phone within reach;with chair alarm set   Nurse Communication          Time: 8490-8479 OT Time Calculation (min): 11 min  Charges: OT General Charges $OT Visit: 1 Visit OT Treatments $Self Care/Home Management : 8-22 mins  Randall, COTA/L Acute Rehabilitation [phone]   Dannielle Delon Jean 11/12/2024, 3:36 PM

## 2024-11-12 NOTE — Progress Notes (Signed)
 Physical Therapy Treatment Patient Details Name: Carla Hanson MRN: [id number] DOB: 1966-08-20 Today's Date: 11/12/2024   History of Present Illness Pt admitted with acute respiratory failure due to bilateral pleural effusions, T6 compression fx with mild cord compression in the setting of refractory multiple myeloma. Neurosurgery does not recommend surgical intervention. 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 PMHx includes HTN, HLD, LLE DVT, CVA w/o residual deficits, AFIB, bronchitis, PNA, COPD, PE, cystectomy, multiple myeloma (currently under tx), OA, anxiety, bipolar    PT Comments  Today's PT session focused on bed mobility and transfers. Pt performed bed mobility with MIN A and up to MOD A for scooting hips to EOB in prep for feet flat to perform transfers. STS x3 attempted with unsuccessful rise to stand despite MAX A+2- pt able to perform successfully with MOD A+2 and use of stedy. Pt has assist from mother at home who has been helping pt at bed level due to functional decline over past few months. Pt currently requires increased assist for bed mobility and +2 assistance for transfers. Updating d/c recs - pt will benefit from continued inpatient therapy <3hrs/day upon d/c to maximize functional mobility, improve independence, and decrease caregiver burden.    If plan is discharge home, recommend the following: Two people to help with walking and/or transfers;A lot of help with bathing/dressing/bathroom;Assistance with cooking/housework;Assist for transportation;Help with stairs or ramp for entrance   Can travel by private vehicle     No  Equipment Recommendations  Hospital bed;Hoyer lift (if pt returns to home)    Recommendations for Other Services       Precautions / Restrictions Precautions Precautions: Fall;Back;Other (comment) Restrictions Weight Bearing Restrictions Per Provider Order: No      Mobility  Bed Mobility Overal bed mobility: Needs Assistance Bed Mobility: Supine to Sit     Supine to sit: Min assist     General bed mobility comments: use of bed rails as needed to assista dn HOB elevated, pt with good intiation fo LEs and trunk to upright. Required up to MOD A for scooting to EOB for feet flat in prep for sit to stand transfers.    Transfers Overall transfer level: Needs assistance Equipment used: Rolling walker (2 wheels) Transfers: Sit to/from Stand, Bed to chair/wheelchair/BSC Sit to Stand: Max assist, +2 physical assistance, +2 safety/equipment, From elevated surface           General transfer comment: Attempted STS x3 with +2 and use of RW with unsuccessful attempts despite MAX A+2, pt with diffulty power up with LEs and leanign forward with increased reliance of arms. Trialed with use of sara stedy and pt able toperform stand with MOD A+2 to intiiate and able to complete rise to full stand with B UEs on stedy and MIN A. Able to stand in stedy for ~1-49min and perform mini squats with B UE supported on stedy. Transfer via Lift Equipment: Stedy  Ambulation/Gait                   Stairs             Wheelchair Mobility     Tilt Bed    Modified Rankin (Stroke Patients Only)       Balance Overall balance assessment: Needs assistance Sitting-balance support: Single extremity supported, Feet supported Sitting balance-Leahy Scale: Fair     Standing balance support: Bilateral upper extremity supported, During functional activity, Reliant on assistive device  for balance Standing balance-Leahy Scale: Poor                              Communication Communication Communication: No apparent difficulties  Cognition Arousal: Alert Behavior During Therapy: WFL for tasks assessed/performed   PT - Cognitive impairments: No apparent impairments                         Following commands: Intact      Cueing  Cueing Techniques: Verbal cues, Tactile cues  Exercises      General Comments General comments (skin integrity, edema, etc.): pt on 1L upon entry with O2 sats at 98%, weaned to RA with O2 sats 94-95%      Pertinent Vitals/Pain Pain Assessment Pain Assessment: No/denies pain    Home Living                          Prior Function            PT Goals (current goals can now be found in the care plan section) Acute Rehab PT Goals Patient Stated Goal: want to get up and walk PT Goal Formulation: With patient Time For Goal Achievement: 11/24/24 Potential to Achieve Goals: Fair Progress towards PT goals: Progressing toward goals    Frequency    Min 3X/week      PT Plan      Co-evaluation              AM-PAC PT 6 Clicks Mobility   Outcome Measure  Help needed turning from your back to your side while in a flat bed without using bedrails?: A Little Help needed moving from lying on your back to sitting on the side of a flat bed without using bedrails?: A Lot Help needed moving to and from a bed to a chair (including a wheelchair)?: Total Help needed standing up from a chair using your arms (e.g., wheelchair or bedside chair)?: Total Help needed to walk in hospital room?: Total Help needed climbing 3-5 steps with a railing? : Total 6 Click Score: 9    End of Session Equipment Utilized During Treatment: Gait belt;Oxygen Activity Tolerance: Patient tolerated treatment well Patient left: in chair;with call bell/phone within reach;with chair alarm set Nurse Communication: Mobility status;Other (comment);Need for lift equipment (use of sara stedy +2) PT Visit Diagnosis: Unsteadiness on feet (R26.81);Muscle weakness (generalized) (M62.81);Other abnormalities of gait and mobility (R26.89)     Time: 8571-8553 PT Time Calculation (min) (ACUTE ONLY): 18 min  Charges:    $Therapeutic Activity: 8-22 mins PT General Charges $$ ACUTE PT VISIT: 1 Visit                      Tinnie BERRY PT, DPT  Acute Rehabilitation Services  Office [phone]  11/12/2024, 4:07 PM

## 2024-11-13 DIAGNOSIS — J9601 Acute respiratory failure with hypoxia: Secondary | ICD-10-CM | POA: Diagnosis not present

## 2024-11-13 LAB — CBC
HCT: 27.8 % — ABNORMAL LOW (ref 36.0–46.0)
Hemoglobin: 8.6 g/dL — ABNORMAL LOW (ref 12.0–15.0)
MCH: 28 pg (ref 26.0–34.0)
MCHC: 30.9 g/dL (ref 30.0–36.0)
MCV: 90.6 fL (ref 80.0–100.0)
Platelets: 21 K/uL — CL (ref 150–400)
RBC: 3.07 MIL/uL — ABNORMAL LOW (ref 3.87–5.11)
RDW: 15.8 % — ABNORMAL HIGH (ref 11.5–15.5)
WBC: 15.6 K/uL — ABNORMAL HIGH (ref 4.0–10.5)
nRBC: 39.1 % — ABNORMAL HIGH (ref 0.0–0.2)

## 2024-11-13 NOTE — Progress Notes (Signed)
" °  Progress Note   Patient: Carla Hanson FMW:[id number] DOB: Sep 30, 1966 DOA: 11/07/2024     6 DOS: the patient was seen and examined on 11/13/2024        Brief hospital course: 59 y.o. F with obesity, HTN, COPD, hx DVT and SAH no longer on AC, and relapsed refractory MM who presented with shortness of breath found to have pleural effusions, worsening T6 compression fracture.     Assessment and Plan: * Acute respiratory failure with hypoxia (HCC) Possible pneumonia Completed 5 days of Rocephin  and doxycycline .  Pleural effusion is resolved on chest x-ray. - Hold further diuresis    Compression fracture of T6 vertebra (HCC) -Continue radiation therapy - Continue dexamethasone   Thrombocytopenia Platelets stable, up to 21K today  Atrial flutter (HCC) - Continue amiodarone  and diltiazem  -Telemetry monitoring - Not on anticoagulation due to thrombocytopenia and history of SAH  Pleural effusion This appears to have gotten better with diuresis  Pressure injury of skin stage II left buttock pressure injury present on admission   Acute on chronic heart failure with preserved ejection fraction (HFpEF) (HCC) Stable - Hold further Lasix   Multiple myeloma not having achieved remission (HCC) - Consult hematology and radiation oncology  Hypercalcemia of malignancy - Continue calcitonin  Hypokalemia - Supplement potassium  BMI 40.0-44.9, adult (HCC)    COPD, moderate (HCC) No active symptoms - Continue antibiotics - Albuterol  as needed - Continue ICS/LABA  Iron deficiency anemia Hemoglobin remained stable - Trend CBC - Continue ferrous sulfate   History of pulmonary embolism Not on anticoagulation due to history of subarachnoid hemorrhage and thrombocytopenia  Essential hypertension Blood pressure controlled - Continue diltiazem            Subjective: Patient is feeling okay, she is tired, she learned some exercises with physical therapy yesterday.  She  has had no new numbness or loss of bowel or bladder function.  No new nursing concerns.     Physical Exam: BP 129/75 (BP Location: Right Arm)   Pulse 66   Temp 98.4 F (36.9 C) (Oral)   Resp 14   Ht 5' 1.5 (1.562 m)   Wt 92.6 kg   LMP  (LMP Unknown)   SpO2 95%   BMI 37.95 kg/m   Obese adult female, lying in bed, watching television RRR, no murmurs, no peripheral edema Respiratory rate normal, lungs clear without rales or wheezes Abdomen soft, no tenderness palpation or guarding, no ascites or distention Upper extremity strength weak but symmetric, lower extremity strength 4 -/5 but symmetric.  No loss of sensation in upper or lower extremities bilaterally, speech fluent, oriented x 3    Data Reviewed: Basic metabolic panel pending CBC shows improved hemoglobin and platelets   Family Communication:     Disposition: Status is: Inpatient         Author: [name], MD 11/13/2024 8:49 AM  For on call review [email or web address]    "

## 2024-11-13 NOTE — Progress Notes (Signed)
 Physical Therapy Treatment Patient Details Name: Carla Hanson MRN: [id number] DOB: 10-Sep-1966 Today's Date: 11/13/2024   History of Present Illness Pt admitted with acute respiratory failure due to bilateral pleural effusions, T6 compression fx with mild cord compression in the setting of refractory multiple myeloma. Neurosurgery does not recommend surgical intervention. 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 Pt performed bed mobility with close supervision and increased time, sit to stand transfers and step transfer to recliner with MOD A+2. Pt ambulated ~32ft with MIN A, use of RW, and close chair follow provided. Pt will benefit from continued skilled PT to increase their independence and maximize safety with mobility.      If plan is discharge home, recommend the following: Two people to help with walking and/or transfers;A lot of help with bathing/dressing/bathroom;Assistance with cooking/housework;Assist for transportation;Help with stairs or ramp for entrance   Can travel by private vehicle     No  Equipment Recommendations  Hospital bed;Hoyer lift (if pt returns to home)    Recommendations for Other Services       Precautions / Restrictions Precautions Precautions: Fall;Back;Other (comment) Precaution/Restrictions Comments: spinal precautions for comfort, may use TLSO for comfort per MD (brace not ordered, pt does not experience back pain throughout eval) Required Braces or Orthoses: Other Brace Other Brace: per conversation with MD, pt may use TLSO for comfort. Spinal precautions  for comfort Restrictions Weight Bearing Restrictions Per Provider Order: No     Mobility  Bed Mobility Overal bed mobility: Needs Assistance Bed Mobility: Supine to Sit     Supine to sit: Supervision     General bed mobility comments: no use of bed rails, good initiation to EOB and able to scoot toward EOB with incr time.    Transfers Overall transfer level: Needs assistance Equipment used: 2 person hand held assist Transfers: Sit to/from Stand, Bed to chair/wheelchair/BSC Sit to Stand: Mod assist, +2 physical assistance, +2 safety/equipment   Step pivot transfers: Mod assist, +2 safety/equipment       General transfer comment: STSx2 with 2 person HHA from EOB, additional STS from recliner with 2 person HHA. pt able to take side steps on each stand trial. 2nd stand trial pt able to stand with 2 person HHA and transition UEs to RW and take steps to recliner. Good management of RW with increased time and cues for sequencing of steps. Attempted stand with pt using of B UEs supported - unsuccessful. pt does best with 2 person HHA and knee block to power up for stand-no buckling noted.    Ambulation/Gait Ambulation/Gait assistance: Min assist Gait Distance (Feet): 7 Feet Assistive device: Rolling walker (2 wheels) Gait Pattern/deviations: Step-through pattern, Narrow base of support Gait velocity: decreased     General Gait Details: Close chair follow provided in rooom from recliner to bathroom door. cues for positioning in RW and sequencing. UE muscle fatigue/fasciculations noted with ambulation.   Stairs             Wheelchair Mobility     Tilt Bed    Modified Rankin (Stroke Patients Only)       Balance Overall balance assessment:  Needs assistance Sitting-balance support: Single extremity supported, Feet supported Sitting balance-Leahy Scale: Good     Standing balance support: Bilateral upper extremity supported, During functional activity, Reliant on  assistive device for balance Standing balance-Leahy Scale: Poor                              Communication Communication Communication: No apparent difficulties  Cognition Arousal: Alert Behavior During Therapy: WFL for tasks assessed/performed   PT - Cognitive impairments: No apparent impairments                         Following commands: Intact      Cueing Cueing Techniques: Verbal cues, Tactile cues  Exercises      General Comments General comments (skin integrity, edema, etc.): educated on performance of hip flexion, LAQ, hip ABD/ADD ankle pumps to be performed outside of PT sessions for increased circulation/strength.      Pertinent Vitals/Pain Pain Assessment Pain Assessment: No/denies pain    Home Living                          Prior Function            PT Goals (current goals can now be found in the care plan section) Acute Rehab PT Goals Patient Stated Goal: want to get up and walk PT Goal Formulation: With patient Time For Goal Achievement: 11/24/24 Potential to Achieve Goals: Fair Progress towards PT goals: Progressing toward goals    Frequency    Min 3X/week      PT Plan      Co-evaluation              AM-PAC PT 6 Clicks Mobility   Outcome Measure  Help needed turning from your back to your side while in a flat bed without using bedrails?: A Little Help needed moving from lying on your back to sitting on the side of a flat bed without using bedrails?: A Little Help needed moving to and from a bed to a chair (including a wheelchair)?: Total Help needed standing up from a chair using your arms (e.g., wheelchair or bedside chair)?: Total Help needed to walk in hospital room?: Total Help needed climbing 3-5 steps with a railing? : Total 6 Click Score: 10    End of Session Equipment Utilized During Treatment: Gait belt Activity Tolerance: Patient tolerated treatment well Patient left: in chair;with  call bell/phone within reach;with chair alarm set Nurse Communication: Mobility status;Other (comment);Need for lift equipment (use of sara stedy +2) PT Visit Diagnosis: Unsteadiness on feet (R26.81);Muscle weakness (generalized) (M62.81);Other abnormalities of gait and mobility (R26.89)     Time: 8982-8961 PT Time Calculation (min) (ACUTE ONLY): 21 min  Charges:    $Therapeutic Activity: 8-22 mins PT General Charges $$ ACUTE PT VISIT: 1 Visit                     Tinnie BERRY PT, DPT  Acute Rehabilitation Services  Office [phone]  11/13/2024, 1:58 PM

## 2024-11-13 NOTE — Plan of Care (Signed)

## 2024-11-14 DIAGNOSIS — J9601 Acute respiratory failure with hypoxia: Secondary | ICD-10-CM | POA: Diagnosis not present

## 2024-11-14 LAB — BASIC METABOLIC PANEL WITH GFR
Anion gap: 8 (ref 5–15)
BUN: 18 mg/dL (ref 6–20)
CO2: 29 mmol/L (ref 22–32)
Calcium: 8.3 mg/dL — ABNORMAL LOW (ref 8.9–10.3)
Chloride: 98 mmol/L (ref 98–111)
Creatinine, Ser: 0.44 mg/dL (ref 0.44–1.00)
GFR, Estimated: >60 mL/min
Glucose, Bld: 154 mg/dL — ABNORMAL HIGH (ref 70–99)
Potassium: 4.4 mmol/L (ref 3.5–5.1)
Sodium: 136 mmol/L (ref 135–145)

## 2024-11-14 LAB — CBC
HCT: 28.6 % — ABNORMAL LOW (ref 36.0–46.0)
Hemoglobin: 8.9 g/dL — ABNORMAL LOW (ref 12.0–15.0)
MCH: 28 pg (ref 26.0–34.0)
MCHC: 31.1 g/dL (ref 30.0–36.0)
MCV: 89.9 fL (ref 80.0–100.0)
Platelets: 22 K/uL — CL (ref 150–400)
RBC: 3.18 MIL/uL — ABNORMAL LOW (ref 3.87–5.11)
RDW: 15.7 % — ABNORMAL HIGH (ref 11.5–15.5)
WBC: 15.6 K/uL — ABNORMAL HIGH (ref 4.0–10.5)
nRBC: 27.1 % — ABNORMAL HIGH (ref 0.0–0.2)

## 2024-11-14 MED ORDER — DEXAMETHASONE 4 MG PO TABS
4.0000 mg | ORAL_TABLET | Freq: Two times a day (BID) | ORAL | Status: DC
  Administered 2024-11-14 – 2024-11-20 (×12): 4 mg via ORAL
  Filled 2024-11-14 (×12): qty 1

## 2024-11-14 MED ORDER — OXYCODONE HCL 5 MG PO TABS
5.0000 mg | ORAL_TABLET | ORAL | Status: DC | PRN
  Administered 2024-11-15 – 2024-11-20 (×7): 5 mg via ORAL
  Filled 2024-11-14 (×10): qty 1

## 2024-11-14 MED ORDER — ORAL CARE MOUTH RINSE: 15.0000 mL | OROMUCOSAL | Status: DC | PRN

## 2024-11-14 NOTE — Progress Notes (Signed)
" °  Progress Note   Patient: Carla Hanson FMW:[id number] DOB: Jun 07, 1966 DOA: 11/07/2024     7 DOS: the patient was seen and examined on 11/14/2024 9:27 AM      Brief hospital course: 59 y.o. F with obesity, HTN, COPD, hx DVT and SAH no longer on AC, and relapsed refractory MM who presented with shortness of breath found to have pleural effusions, worsening T6 compression fracture.     Assessment and Plan: * Acute respiratory failure with hypoxia (HCC) Possible pneumonia Treated with 5 days Rocephin  and doxycycline  and diuretics.  Weaned to room air.  Effusions resolved on CXR.     Compression fracture of T6 vertebra (HCC) - Consult radiation oncology - Continue Decadron , begin wean  Thrombocytopenia - Platelet transfusion for <10K or <50K with bleeding - Trend CBC  Atrial flutter (HCC) - Continue amiodarone  and diltiazem  -Telemetry monitoring - Not on anticoagulation due to thrombocytopenia and history of SAH  Pleural effusion Small, resolved with diuresis  Pressure injury of skin stage II left buttock pressure injury present on admission   Acute on chronic heart failure with preserved ejection fraction (HFpEF) (HCC) Treated with IV Lasix  and resolved. Not on diuretics as an outpatient.  Multiple myeloma not having achieved remission (HCC) - Consult hematology and radiation oncology  Hypercalcemia of malignancy Completed 7 days calcitonin, asymptomatic  Hypokalemia - Supplement potassium  BMI 40.0-44.9, adult (HCC)    COPD, moderate (HCC) No active symptoms - Continue antibiotics - Albuterol  as needed - Continue ICS/LABA  Iron deficiency anemia Hemoglobin remained stable - Trend CBC - Continue ferrous sulfate   History of pulmonary embolism Not on anticoagulation due to history of subarachnoid hemorrhage and thrombocytopenia  Essential hypertension Blood pressure controlled - Continue diltiazem           Subjective: Patient is feeling fine,  no new concerns, no nursing concerns.     Physical Exam: BP 128/77 (BP Location: Right Arm)   Pulse 78   Temp 99 F (37.2 C) (Oral)   Resp 18   Ht 5' 1.5 (1.562 m)   Wt 92.6 kg   LMP  (LMP Unknown)   SpO2 93%   BMI 37.95 kg/m   Obese adult female, lying in bed, interactive and appropriate RRR, no murmurs, no peripheral edema Respiratory rate normal, lungs clear without rales or wheezes Abdomen soft, no tenderness palpation or guarding, no ascites or distention Attention normal, affect normal, judgment and insight appear normal    Data Reviewed: Basic metabolic panel shows normal electrolytes and renal function CBC shows stable anemia, platelets slowly trending up     Family Communication:     Disposition: Status is: Inpatient         Author: [name], MD 11/14/2024 2:12 PM  For on call review [email or web address]    "

## 2024-11-14 NOTE — Progress Notes (Signed)
 "                                                                                                                                                                                                          Daily Progress Note   Patient Name: Carla Hanson       Date: 11/14/2024 DOB: 1966/01/13  Age: 59 y.o. MRN#: [id number] Attending Physician: Jonel Lonni SQUIBB, * Primary Care Physician: Anders Otto DASEN, MD Admit Date: 11/07/2024  Reason for Consultation/Follow-up: Establishing goals of care  Subjective: Patient is awake alert resting in bed, states that her radiation is going well abdominal discomfort has resolved, she has had bowel movements.  Trying to participate with physical therapy   Length of Stay: 7  Current Medications: Scheduled Meds:   acyclovir   400 mg Oral BID   amiodarone   200 mg Oral BID   dexamethasone   4 mg Oral Q12H   diltiazem   240 mg Oral Daily   ferrous sulfate   325 mg Oral Q breakfast   fluticasone  furoate-vilanterol  1 puff Inhalation Daily   folic acid   1 mg Oral BID   polyethylene glycol  17 g Oral Daily   potassium chloride   20 mEq Oral Daily   thiamine   100 mg Oral Daily    Continuous Infusions:    PRN Meds: acetaminophen  **OR** acetaminophen , albuterol , alum & mag hydroxide-simeth, bisacodyl , HYDROmorphone  (DILAUDID ) injection, mouth rinse  Physical Exam         Generalized weakness Awake alert No distress Breath sounds clear  generalized edema upper lower extremities   Abdomen not distended  Vital Signs: BP 132/76 (BP Location: Right Arm)   Pulse 61   Temp 98.5 F (36.9 C) (Oral)   Resp 16   Ht 5' 1.5 (1.562 m)   Wt 92.6 kg   LMP  (LMP Unknown)   SpO2 94%   BMI 37.95 kg/m  SpO2: SpO2: 94 % O2 Device: O2 Device: Room Air O2 Flow Rate: O2 Flow Rate (L/min): (S) 3 L/min (down to 1 lpm post tx- rn aware)  Intake/output summary:  Intake/Output Summary (Last 24 hours) at 11/14/2024 1355 Last data filed at 11/13/2024 2059 Gross  per 24 hour  Intake --  Output 300 ml  Net -300 ml   LBM: Last BM Date : 11/13/24 Baseline Weight: Weight: 92.6 kg Most recent weight: Weight: 92.6 kg       Palliative Assessment/Data:      Patient Active Problem List   Diagnosis Date Noted   Pressure injury of skin 11/10/2024  Atrial flutter (HCC) 11/10/2024   Compression fracture of T6 vertebra (HCC) 11/08/2024   Acute on chronic heart failure with preserved ejection fraction (HFpEF) (HCC) 11/08/2024   Acute respiratory failure with hypoxia (HCC) 11/07/2024   Pleural effusion 11/07/2024   Need for emotional support 11/03/2024   Pain 11/02/2024   Counseling and coordination of care 11/02/2024   Goals of care, counseling/discussion 11/02/2024   Palliative care encounter 11/02/2024   Thrombocytopenia 10/23/2024   Anemia due to antineoplastic agent 10/21/2024   Counseling regarding advance care planning and goals of care 10/21/2024   Symptomatic anemia 10/20/2024   Moderate protein-calorie malnutrition 09/21/2024   Chemotherapy-induced neutropenia 09/09/2024   Folate deficiency 09/09/2024   Pancytopenia (HCC) 09/08/2024   History of compression fracture of spine 04/12/2024   Electrolyte abnormality 02/10/2024   Edema leg 02/10/2024   Hypercalcemia of malignancy 02/02/2024   Multiple myeloma not having achieved remission (HCC) 02/02/2024   Hypokalemia 01/30/2024   BMI 40.0-44.9, adult (HCC) 12/18/2022   Chronic pain of both shoulders 09/05/2022   Vitamin D  deficiency 12/19/2020   Palpitation 12/03/2019   History of intracranial hemorrhage 08/02/2019   History of DVT of lower extremity 08/02/2019   History of seizure 08/02/2019   COPD, moderate (HCC) 12/11/2018   Anemia 02/11/2018   History of hemorrhagic cerebrovascular accident (CVA) without residual deficits 08/13/2017   Iron deficiency anemia 12/12/2016   Pre-diabetes 03/02/2013   History of pulmonary embolism 05/05/2012   Former tobacco use 08/23/2009    Hyperlipidemia 04/30/2007   Essential hypertension 01/01/2007    Palliative Care Assessment & Plan   58 year old lady with refractory IgG lambda multiple myeloma, follows with Dr. Onesimo at [facility], admitted with weakness to the hospital medicine service, also seen by radiation oncology for T6 pathologic compression fracture Patient is status post multiple lines of chemotherapy She is in the process of being referred to [facility] for consideration of bite therapy Patient was admitted from October 20, 2024 through November 05, 2024 for symptomatic anemia that required 5 units of PRBC transfusion.  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  1.  Pain-patient has Decadron  PO, add Oxy IR PO PRN for moderate pain and use IV hydromorphone  for severe or breakthrough pain, started on radiation from 11-10-2024 after simulation.  Patient states radiation appointments are going well 2.  CODE STATUS and goals of care discussions: Full code.  Care for now Patient wishes for transfer to [facility] for further treatments for her life-limiting illness Currently admitted to the hospital and undergoing radiation treatments 3.  Abdominal pain and constipation  On a bowel regimen and continue to monitor 4. Deconditioning  PT note reviewed, continued skilled PT has been recommended.     Goals of Care and Additional Recommendations: Limitations on Scope of Treatment: Full Scope Treatment  Code Status:    Code Status Orders  (From admission, onward)           Start     Ordered   11/07/24 2056   Full code  Continuous       Question:  By:  Answer:  Consent: discussion documented in EHR   11/07/24 2056  Code Status History     Date Active Date Inactive Code Status Order ID Comments User Context   10/20/2024 1050 11/05/2024 2300 Full Code [id number]  Zella Katha HERO, MD ED   09/08/2024 1708 09/12/2024 1738 Full Code [id number]  Sebastian Toribio GAILS, MD ED   01/30/2024 2047 02/05/2024 2004 Full Code [id number]  Tobie Jorie SAUNDERS, MD Inpatient   11/08/2016 2132 11/13/2016 2053 Full Code [id number]  Michaela Aisha SQUIBB, MD ED   10/31/2016 2025 11/03/2016 1615 Full Code [id number]  Drusilla Sabas RAMAN, MD Inpatient   05/05/2012 0158 05/05/2012 2331 Full Code [id number]  Ritch, Dayton BIRCH, MD Inpatient       Prognosis:  Unable to determine But does appear guarded given extensive nature of serious illness. Discharge Planning: Home with Palliative Services Also recommend palliative care follow-up at [facility] Care plan was discussed with patient    Thank you for allowing the Palliative Medicine Team to assist in the care of this patient. I personally spent a total of 35 minutes in the care of the patient today including preparing to see the patient, getting/reviewing separately obtained history, performing a medically appropriate exam/evaluation, counseling and educating, referring and communicating with other health care professionals, documenting clinical information in the EHR, and communicating results.      Greater than 50%  of this time was spent counseling and coordinating care related to the above assessment and plan.  Lonia Serve, MD  Please contact Palliative Medicine Team phone at [phone] for questions and concerns.       "

## 2024-11-14 NOTE — Plan of Care (Signed)

## 2024-11-14 NOTE — Progress Notes (Signed)
" °   11/14/24 1142  PT Visit Information  Last PT Received On 11/14/24  Reason Eval/Treat Not Completed  (pt politely deferring mobility this date reporting feeling tired, stomach discomfort, and wanting to rest today. Agreeable to participation in next PT session. Request PT attempt tomorrow. Encouraged continued performance of ther ex outside of sessions.)   PT will follow up as schedule allows.   "

## 2024-11-14 NOTE — Plan of Care (Signed)
" °  Problem: Education: Goal: Knowledge of General Education information will improve Description: Including pain rating scale, medication(s)/side effects and non-pharmacologic comfort measures Outcome: Progressing   Problem: Clinical Measurements: Goal: Respiratory complications will improve Outcome: Progressing Goal: Cardiovascular complication will be avoided Outcome: Progressing   Problem: Safety: Goal: Ability to remain free from injury will improve Outcome: Progressing   Problem: Skin Integrity: Goal: Risk for impaired skin integrity will decrease Outcome: Progressing   Problem: Education: Goal: Knowledge of General Education information will improve Description: Including pain rating scale, medication(s)/side effects and non-pharmacologic comfort measures Outcome: Progressing   Problem: Clinical Measurements: Goal: Respiratory complications will improve Outcome: Progressing Goal: Cardiovascular complication will be avoided Outcome: Progressing   Problem: Safety: Goal: Ability to remain free from injury will improve Outcome: Progressing   Problem: Skin Integrity: Goal: Risk for impaired skin integrity will decrease Outcome: Progressing   "

## 2024-11-15 ENCOUNTER — Inpatient Hospital Stay: Admitting: Nurse Practitioner

## 2024-11-15 ENCOUNTER — Other Ambulatory Visit: Payer: Self-pay

## 2024-11-15 ENCOUNTER — Ambulatory Visit

## 2024-11-15 ENCOUNTER — Other Ambulatory Visit (HOSPITAL_COMMUNITY): Payer: Self-pay

## 2024-11-15 DIAGNOSIS — J9601 Acute respiratory failure with hypoxia: Secondary | ICD-10-CM | POA: Diagnosis not present

## 2024-11-15 LAB — RAD ONC ARIA SESSION SUMMARY
Course Elapsed Days: 5
Plan Fractions Treated to Date: 4
Plan Prescribed Dose Per Fraction: 2.5 Gy
Plan Total Fractions Prescribed: 10
Plan Total Prescribed Dose: 25 Gy
Reference Point Dosage Given to Date: 10 Gy
Reference Point Session Dosage Given: 2.5 Gy
Session Number: 4

## 2024-11-15 NOTE — Progress Notes (Signed)
" °  Progress Note   Patient: Carla Hanson FMW:[id number] DOB: 10-13-1966 DOA: 11/07/2024     8 DOS: the patient was seen and examined on 11/15/2024        Brief hospital course: 59 y.o. F with obesity, HTN, COPD, hx DVT and SAH no longer on AC, and relapsed refractory MM who presented with shortness of breath found to have pleural effusions, worsening T6 compression fracture.     Assessment and Plan: * Acute respiratory failure with hypoxia (HCC) Possible pneumonia Treated with 5 days Rocephin  and doxycycline  and diuretics.  Weaned to room air.  Effusions resolved on CXR.     Compression fracture of T6 vertebra (HCC) T6 compression fracture at the time of diagnosis in 2025.  On recent imaging this appears worse.  Concern for progression and spinal cord impingement.  Thankfully no lower extremity weakness, bowel or bladder dysfunction - Consult radiation oncology - Continue Decadron , continue wean  Thrombocytopenia Trending up - Platelet transfusion for <10K or <50K with bleeding - Trend CBC  Atrial flutter (HCC) - Continue amiodarone  and diltiazem  -Telemetry monitoring - Not on anticoagulation due to thrombocytopenia and history of SAH  Pleural effusion Small, resolved with diuresis  Pressure injury of skin stage II left buttock pressure injury present on admission   Acute on chronic heart failure with preserved ejection fraction (HFpEF) (HCC) Treated with IV Lasix  and resolved. Not on diuretics as an outpatient.  Multiple myeloma not having achieved remission (HCC) - Consult hematology and radiation oncology  Hypercalcemia of malignancy Completed 7 days calcitonin, asymptomatic  Hypokalemia - Supplement potassium  BMI 40.0-44.9, adult (HCC)    COPD, moderate (HCC) No active symptoms - Continue antibiotics - Albuterol  as needed - Continue ICS/LABA  Iron deficiency anemia Hemoglobin remained stable - Trend CBC - Continue ferrous sulfate   History of  pulmonary embolism Not on anticoagulation due to history of subarachnoid hemorrhage and thrombocytopenia  Essential hypertension Blood pressure controlled - Continue diltiazem , Lasix           Subjective: No clinical change, no new nursing concerns, no fever, no respiratory symptoms     Physical Exam: BP 133/71 (BP Location: Right Arm)   Pulse 65   Temp 98.3 F (36.8 C) (Oral)   Resp 16   Ht 5' 1.5 (1.562 m)   Wt 92.6 kg   LMP  (LMP Unknown)   SpO2 98%   BMI 37.95 kg/m   Adult female, sitting up in bed, interactive and appropriate RRR, no murmurs, no peripheral edema Respiratory normal, lungs clear without rales or wheezes Abdomen soft no tenderness palpation or guarding, no ascites or distention     Disposition: Status is: Inpatient         Author: [name], MD 11/15/2024 4:19 PM  For on call review [email or web address].    "

## 2024-11-15 NOTE — NC FL2 (Signed)
 " [place]  MEDICAID FL2 LEVEL OF CARE FORM     IDENTIFICATION  Patient Name: Carla Hanson Birthdate: [birth year] Sex: female Admission Date (Current Location): 11/07/2024  [place] and [place] Number:  [place] [id number] Facility and Address:  [facility],  [address], [place] [address]      Provider Number: [id number]  Attending Physician Name and Address:  Jonel Lonni SQUIBB, *  Relative Name and Phone Number:  Mother Yong Ruth [phone]    Current Level of Care: Hospital Recommended Level of Care: Skilled Nursing Facility Prior Approval Number:    Date Approved/Denied:   PASRR Number: [id number]  Discharge Plan: SNF    Current Diagnoses: Patient Active Problem List   Diagnosis Date Noted   Pressure injury of skin 11/10/2024   Atrial flutter (HCC) 11/10/2024   Compression fracture of T6 vertebra (HCC) 11/08/2024   Acute on chronic heart failure with preserved ejection fraction (HFpEF) (HCC) 11/08/2024   Acute respiratory failure with hypoxia (HCC) 11/07/2024   Pleural effusion 11/07/2024   Need for emotional support 11/03/2024   Pain 11/02/2024   Counseling and coordination of care 11/02/2024   Goals of care, counseling/discussion 11/02/2024   Palliative care encounter 11/02/2024   Thrombocytopenia 10/23/2024   Anemia due to antineoplastic agent 10/21/2024   Counseling regarding advance care planning and goals of care 10/21/2024   Symptomatic anemia 10/20/2024   Moderate protein-calorie malnutrition 09/21/2024   Chemotherapy-induced neutropenia 09/09/2024   Folate deficiency 09/09/2024   Pancytopenia (HCC) 09/08/2024   History of compression fracture of spine 04/12/2024   Electrolyte abnormality 02/10/2024   Edema leg 02/10/2024   Hypercalcemia of malignancy 02/02/2024   Multiple myeloma not having achieved remission (HCC) 02/02/2024   Hypokalemia 01/30/2024   BMI 40.0-44.9, adult (HCC) 12/18/2022   Chronic pain of both  shoulders 09/05/2022   Vitamin D  deficiency 12/19/2020   Palpitation 12/03/2019   History of intracranial hemorrhage 08/02/2019   History of DVT of lower extremity 08/02/2019   History of seizure 08/02/2019   COPD, moderate (HCC) 12/11/2018   Anemia 02/11/2018   History of hemorrhagic cerebrovascular accident (CVA) without residual deficits 08/13/2017   Iron deficiency anemia 12/12/2016   Pre-diabetes 03/02/2013   History of pulmonary embolism 05/05/2012   Former tobacco use 08/23/2009   Hyperlipidemia 04/30/2007   Essential hypertension 01/01/2007    Orientation RESPIRATION BLADDER Height & Weight     Self, Time, Situation, Place  Normal Continent Weight: 92.6 kg Height:  5' 1.5 (156.2 cm)  BEHAVIORAL SYMPTOMS/MOOD NEUROLOGICAL BOWEL NUTRITION STATUS     (n/a) Continent Diet (Regular)  AMBULATORY STATUS COMMUNICATION OF NEEDS Skin   Limited Assist Verbally Bruising, Other (Comment) (ecchymosis bilateral buttocks & leg, petechia bilateral legs)                       Personal Care Assistance Level of Assistance  Bathing, Feeding, Dressing, Total care Bathing Assistance: Limited assistance Feeding assistance: Independent (after set up) Dressing Assistance: Limited assistance Total Care Assistance:  (n/a)   Functional Limitations Info  Sight, Hearing, Speech Sight Info: Adequate Hearing Info: Adequate Speech Info: Adequate    SPECIAL CARE FACTORS FREQUENCY  PT (By licensed PT), OT (By licensed OT)     PT Frequency: 5x/wk OT Frequency: 5x/wk            Contractures Contractures Info: Not present    Additional Factors Info  Code Status, Allergies, Psychotropic, Insulin  Sliding Scale, Isolation Precautions, Suctioning Needs Code Status  Info: Full Allergies Info: Latuda  (Lurasidone ) Psychotropic Info: see d/c summary Insulin  Sliding Scale Info: see d/c summary Isolation Precautions Info: see d/c summary Suctioning Needs: n/a   Current Medications  (11/15/2024):  This is the current hospital active medication list Current Facility-Administered Medications  Medication Dose Route Frequency Provider Last Rate Last Admin   acetaminophen  (TYLENOL ) tablet 650 mg  650 mg Oral Q6H PRN Franky Redia SAILOR, MD       Or   acetaminophen  (TYLENOL ) suppository 650 mg  650 mg Rectal Q6H PRN Franky Redia SAILOR, MD       acyclovir  (ZOVIRAX ) tablet 400 mg  400 mg Oral BID Franky Redia SAILOR, MD   400 mg at 11/15/24 0959   albuterol  (PROVENTIL ) (2.5 MG/3ML) 0.083% nebulizer solution 2.5 mg  2.5 mg Nebulization Q6H PRN Jonel Lonni SQUIBB, MD   2.5 mg at 11/10/24 1755   alum & mag hydroxide-simeth (MAALOX/MYLANTA) 200-200-20 MG/5ML suspension 30 mL  30 mL Oral Q4H PRN Will Almarie MATSU, MD   30 mL at 11/10/24 9162   amiodarone  (PACERONE ) tablet 200 mg  200 mg Oral BID Kakrakandy, Arshad N, MD   200 mg at 11/15/24 9040   bisacodyl  (DULCOLAX) EC tablet 5 mg  5 mg Oral Daily PRN Anwar, Zeba, MD       dexamethasone  (DECADRON ) tablet 4 mg  4 mg Oral Q12H Danford, Lonni SQUIBB, MD   4 mg at 11/15/24 0959   diltiazem  (CARDIZEM  CD) 24 hr capsule 240 mg  240 mg Oral Daily Kakrakandy, Arshad N, MD   240 mg at 11/15/24 9040   ferrous sulfate  tablet 325 mg  325 mg Oral Q breakfast Kakrakandy, Arshad N, MD   325 mg at 11/15/24 0754   fluticasone  furoate-vilanterol (BREO ELLIPTA ) 100-25 MCG/ACT 1 puff  1 puff Inhalation Daily Franky Redia SAILOR, MD   1 puff at 11/15/24 0753   folic acid  (FOLVITE ) tablet 1 mg  1 mg Oral BID Kakrakandy, Arshad N, MD   1 mg at 11/15/24 9040   HYDROmorphone  (DILAUDID ) injection 1 mg  1 mg Intravenous Q4H PRN Mathews, Elizabeth G, MD   1 mg at 11/14/24 1931   Oral care mouth rinse  15 mL Mouth Rinse PRN Danford, Lonni SQUIBB, MD       oxyCODONE  (Oxy IR/ROXICODONE ) immediate release tablet 5 mg  5 mg Oral Q4H PRN Anwar, Zeba, MD   5 mg at 11/15/24 0753   polyethylene glycol (MIRALAX  / GLYCOLAX ) packet 17 g  17 g Oral Daily Anwar,  Zeba, MD   17 g at 11/13/24 0850   potassium chloride  SA (KLOR-CON  M) CR tablet 20 mEq  20 mEq Oral Daily Will Almarie MATSU, MD   20 mEq at 11/15/24 9040   thiamine  (VITAMIN B1) tablet 100 mg  100 mg Oral Daily Carolee Rosaline DASEN, RPH   100 mg at 11/15/24 9040     Discharge Medications: Please see discharge summary for a list of discharge medications.  Relevant Imaging Results:  Relevant Lab Results:   Additional Information SS# [id number]  Patient currently undergoing daily radiation therapy M-F until 1/20. Will need transortation.  Toy LITTIE Agar, RN     "

## 2024-11-15 NOTE — Progress Notes (Signed)
 "                                                                                                                                                                                                          Daily Progress Note   Patient Name: Carla Hanson       Date: 11/15/2024 DOB: November 17, 1965  Age: 59 y.o. MRN#: [id number] Attending Physician: Jonel Lonni SQUIBB, * Primary Care Physician: Anders Otto DASEN, MD Admit Date: 11/07/2024  Reason for Consultation/Follow-up: Establishing goals of care  Subjective: Patient is awake alert resting in bed, episodic abdominal discomfort continues, although none currently.  Mother at bedside, patient is awaiting her radiation treatment this morning.        Length of Stay: 8  Current Medications: Scheduled Meds:   acyclovir   400 mg Oral BID   amiodarone   200 mg Oral BID   dexamethasone   4 mg Oral Q12H   diltiazem   240 mg Oral Daily   ferrous sulfate   325 mg Oral Q breakfast   fluticasone  furoate-vilanterol  1 puff Inhalation Daily   folic acid   1 mg Oral BID   polyethylene glycol  17 g Oral Daily   potassium chloride   20 mEq Oral Daily   thiamine   100 mg Oral Daily    Continuous Infusions:    PRN Meds: acetaminophen  **OR** acetaminophen , albuterol , alum & mag hydroxide-simeth, bisacodyl , HYDROmorphone  (DILAUDID ) injection, mouth rinse, oxyCODONE   Physical Exam         Generalized weakness Awake alert No distress Breath sounds clear  generalized edema upper lower extremities   Abdomen not distended  Vital Signs: BP (!) 150/86 (BP Location: Right Arm)   Pulse 70   Temp 98.2 F (36.8 C) (Oral)   Resp 20   Ht 5' 1.5 (1.562 m)   Wt 92.6 kg   LMP  (LMP Unknown)   SpO2 98%   BMI 37.95 kg/m  SpO2: SpO2: 98 % O2 Device: O2 Device: Room Air O2 Flow Rate: O2 Flow Rate (L/min): (S) 3 L/min (down to 1 lpm post tx- rn aware)  Intake/output summary:  Intake/Output Summary (Last 24 hours) at 11/15/2024 0951 Last data filed at  11/15/2024 0504 Gross per 24 hour  Intake 960 ml  Output 1200 ml  Net -240 ml   LBM: Last BM Date : 11/15/24 Baseline Weight: Weight: 92.6 kg Most recent weight: Weight: 92.6 kg       Palliative Assessment/Data:      Patient Active Problem List   Diagnosis Date Noted   Pressure injury  of skin 11/10/2024   Atrial flutter (HCC) 11/10/2024   Compression fracture of T6 vertebra (HCC) 11/08/2024   Acute on chronic heart failure with preserved ejection fraction (HFpEF) (HCC) 11/08/2024   Acute respiratory failure with hypoxia (HCC) 11/07/2024   Pleural effusion 11/07/2024   Need for emotional support 11/03/2024   Pain 11/02/2024   Counseling and coordination of care 11/02/2024   Goals of care, counseling/discussion 11/02/2024   Palliative care encounter 11/02/2024   Thrombocytopenia 10/23/2024   Anemia due to antineoplastic agent 10/21/2024   Counseling regarding advance care planning and goals of care 10/21/2024   Symptomatic anemia 10/20/2024   Moderate protein-calorie malnutrition 09/21/2024   Chemotherapy-induced neutropenia 09/09/2024   Folate deficiency 09/09/2024   Pancytopenia (HCC) 09/08/2024   History of compression fracture of spine 04/12/2024   Electrolyte abnormality 02/10/2024   Edema leg 02/10/2024   Hypercalcemia of malignancy 02/02/2024   Multiple myeloma not having achieved remission (HCC) 02/02/2024   Hypokalemia 01/30/2024   BMI 40.0-44.9, adult (HCC) 12/18/2022   Chronic pain of both shoulders 09/05/2022   Vitamin D  deficiency 12/19/2020   Palpitation 12/03/2019   History of intracranial hemorrhage 08/02/2019   History of DVT of lower extremity 08/02/2019   History of seizure 08/02/2019   COPD, moderate (HCC) 12/11/2018   Anemia 02/11/2018   History of hemorrhagic cerebrovascular accident (CVA) without residual deficits 08/13/2017   Iron deficiency anemia 12/12/2016   Pre-diabetes 03/02/2013   History of pulmonary embolism 05/05/2012   Former  tobacco use 08/23/2009   Hyperlipidemia 04/30/2007   Essential hypertension 01/01/2007    Palliative Care Assessment & Plan   59 year old lady with refractory IgG lambda multiple myeloma, follows with Dr. Onesimo at [facility], admitted with weakness to the hospital medicine service, also seen by radiation oncology for T6 pathologic compression fracture Patient is status post multiple lines of chemotherapy She is in the process of being referred to [facility] for consideration of bite therapy Patient was admitted from October 20, 2024 through November 05, 2024 for symptomatic anemia that required 5 units of PRBC transfusion.  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  1.  Pain-patient has Decadron  PO, now on Oxy IR 5 mg PO PRN for moderate pain and also to use IV hydromorphone  for severe or breakthrough pain, started on radiation from 11-10-2024 after simulation.  Patient states radiation appointments are going well. One dose of PO 5 mg Oxy IR and 1 dose of IV Dilaudid  1 mg in the past 24 hours. Discussed with patient about her pain regimen.   2.  CODE STATUS and goals of care discussions: Full code, full scope care for now, briefly reviewed this with patient as well as with her mother at bedside this am.  Patient wishes for transfer to [facility] for further treatments for her life-limiting illness Currently admitted to the hospital and undergoing radiation treatments 3.  Abdominal pain and constipation  On a bowel regimen and continue to monitor 4. Deconditioning  PT note reviewed,  continued skilled PT has been recommended.  5. Care coordination: Recommend outpatient palliative care at patient's next discharge location.   Goals of Care and Additional  Recommendations: Limitations on Scope of Treatment: Full Scope Treatment  Code Status:    Code Status Orders  (From admission, onward)           Start     Ordered   11/07/24 2056  Full code  Continuous       Question:  By:  Answer:  Consent: discussion documented in EHR   11/07/24 2056           Code Status History     Date Active Date Inactive Code Status Order ID Comments User Context   10/20/2024 1050 11/05/2024 2300 Full Code [id number]  Zella Katha HERO, MD ED   09/08/2024 1708 09/12/2024 1738 Full Code [id number]  Sebastian Toribio GAILS, MD ED   01/30/2024 2047 02/05/2024 2004 Full Code [id number]  Tobie Jorie SAUNDERS, MD Inpatient   11/08/2016 2132 11/13/2016 2053 Full Code [id number]  Michaela Aisha SQUIBB, MD ED   10/31/2016 2025 11/03/2016 1615 Full Code [id number]  Drusilla Sabas RAMAN, MD Inpatient   05/05/2012 0158 05/05/2012 2331 Full Code [id number]  Ritch, [place] BIRCH, MD Inpatient       Prognosis:  Unable to determine But does appear guarded given extensive nature of serious illness. Discharge Planning: Home with Palliative Services Also recommend palliative care follow-[facility] Care plan was discussed with patient  and mother.   Thank you for allowing the Palliative Medicine Team to assist in the care of this patient. I personally spent a total of 35 minutes in the care of the patient today including preparing to see the patient, getting/reviewing separately obtained history, performing a medically appropriate exam/evaluation, counseling and educating, referring and communicating with other health care professionals, documenting clinical information in the EHR, and communicating results.      Greater than 50%  of this time was spent counseling and coordinating care related to the above  assessment and plan.  Lonia Serve, MD  Please contact Palliative Medicine Team phone at [phone] for questions and concerns.       "

## 2024-11-15 NOTE — Plan of Care (Signed)
  Problem: Clinical Measurements: Goal: Ability to maintain clinical measurements within normal limits will improve Outcome: Progressing   Problem: Elimination: Goal: Will not experience complications related to bowel motility Outcome: Progressing   Problem: Pain Managment: Goal: General experience of comfort will improve and/or be controlled Outcome: Progressing   Problem: Safety: Goal: Ability to remain free from injury will improve Outcome: Progressing

## 2024-11-15 NOTE — TOC Progression Note (Addendum)
 Transition of Care [facility]) - Progression Note    Patient Details  Name: Carla Hanson MRN: [id number] Date of Birth: 17-Jul-1966  Transition of Care [facility]) CM/SW Contact  Toy LITTIE Agar, RN Phone [phone]  11/15/2024, 2:27 PM  Clinical Narrative:    Cm at bedside to update patient on PT recommendations for rehab. Patient is agreeable initiate bed search. CM provided patient with medicare.gov list of facilities.   FL2 completed and faxed out for bed offers.    Expected Discharge Plan: Home w Home Health Services Barriers to Discharge: Continued Medical Work up               Expected Discharge Plan and Services In-house Referral: NA Discharge Planning Services: CM Consult Post Acute Care Choice:  (patient states that she has HH PT /OT/RN/ (BSC , shower chair, rollator, walker, wheelchair)) Living arrangements for the past 2 months: Single Family Home                 DME Arranged: N/A DME Agency: NA       HH Arranged: NA HH Agency: NA         Social Drivers of Health (SDOH) Interventions SDOH Screenings   Food Insecurity: No Food Insecurity (11/07/2024)  Housing: Low Risk (11/07/2024)  Transportation Needs: No Transportation Needs (11/07/2024)  Utilities: Not At Risk (11/07/2024)  Depression (PHQ2-9): Low Risk (09/08/2024)  Financial Resource Strain: Low Risk (05/14/2024)  Physical Activity: Insufficiently Active (05/14/2024)  Social Connections: Moderately Integrated (10/20/2024)  Stress: No Stress Concern Present (05/14/2024)  Tobacco Use: Medium Risk (11/08/2024)    Readmission Risk Interventions    11/09/2024    3:02 PM 10/26/2024    2:05 PM 02/03/2024    1:14 PM  Readmission Risk Prevention Plan  Transportation Screening Complete Complete Complete  PCP or Specialist Appt within 3-5 Days Complete Complete Complete  HRI or Home Care Consult Complete Complete   Social Work Consult for Recovery Care Planning/Counseling Complete Complete Complete  Palliative  Care Screening Not Applicable  Not Applicable  Medication Review Oceanographer) Referral to Pharmacy Complete Complete

## 2024-11-15 NOTE — Plan of Care (Signed)

## 2024-11-16 ENCOUNTER — Other Ambulatory Visit: Payer: Self-pay

## 2024-11-16 ENCOUNTER — Ambulatory Visit

## 2024-11-16 DIAGNOSIS — J9601 Acute respiratory failure with hypoxia: Secondary | ICD-10-CM | POA: Diagnosis not present

## 2024-11-16 LAB — RAD ONC ARIA SESSION SUMMARY
Course Elapsed Days: 6
Plan Fractions Treated to Date: 5
Plan Prescribed Dose Per Fraction: 2.5 Gy
Plan Total Fractions Prescribed: 10
Plan Total Prescribed Dose: 25 Gy
Reference Point Dosage Given to Date: 12.5 Gy
Reference Point Session Dosage Given: 2.5 Gy
Session Number: 5

## 2024-11-16 NOTE — Plan of Care (Signed)

## 2024-11-16 NOTE — Progress Notes (Signed)
" °  Progress Note   Patient: Carla Hanson FMW:[id number] DOB: 1966-09-16 DOA: 11/07/2024     9 DOS: the patient was seen and examined on 11/16/2024 at 9:40 AM      Brief hospital course: 59 y.o. F with obesity, HTN, COPD, hx DVT and SAH no longer on AC, and relapsed refractory MM who presented with shortness of breath found to have pleural effusions, worsening T6 compression fracture.     Assessment and Plan: * Acute respiratory failure with hypoxia (HCC) Possible pneumonia Treated with 5 days Rocephin  and doxycycline  and diuretics.  Weaned to room air.  Effusions resolved on CXR.     Compression fracture of T6 vertebra (HCC) T6 compression fracture at the time of diagnosis in 2025.  On recent imaging this appears worse.  Concern for progression and spinal cord impingement.  Thankfully no lower extremity weakness, bowel or bladder dysfunction - Consult radiation oncology - Continue Decadron , wean to once daily  Thrombocytopenia - Platelet transfusion for <10K or <50K with bleeding - Trend CBC  Atrial flutter (HCC) - Continue amiodarone  and diltiazem  -Telemetry monitoring - Not on anticoagulation due to thrombocytopenia and history of SAH  Pleural effusion Small, resolved with diuresis  Pressure injury of skin stage II left buttock pressure injury present on admission   Acute on chronic heart failure with preserved ejection fraction (HFpEF) (HCC) Treated with IV Lasix  and resolved. Not on diuretics as an outpatient.  Multiple myeloma not having achieved remission (HCC) - Consult hematology and radiation oncology  Hypercalcemia of malignancy Completed 7 days calcitonin, asymptomatic  Hypokalemia - Supplement potassium  BMI 40.0-44.9, adult (HCC)    COPD, moderate (HCC) No active symptoms - Continue antibiotics - Albuterol  as needed - Continue ICS/LABA  Iron deficiency anemia Hemoglobin remained stable - Trend CBC - Continue ferrous sulfate   History of  pulmonary embolism Not on anticoagulation due to history of subarachnoid hemorrhage and thrombocytopenia  Essential hypertension Blood pressure controlled - Continue diltiazem , Lasix           Subjective: Practicing her exercises.  No new weakness, no bleeding, no change in mentation.  No nursing concerns.     Physical Exam: BP 132/82 (BP Location: Right Arm)   Pulse 73   Temp 97.6 F (36.4 C) (Oral)   Resp 18   Ht 5' 1.5 (1.562 m)   Wt 92.6 kg   LMP  (LMP Unknown)   SpO2 100%   BMI 37.95 kg/m   Adult female, sitting up in bed, interactive and appropriate RRR, no murmurs, no peripheral edema Respiratory rate normal, lungs clear without rales or wheezes Abdomen soft, no tenderness palpation or guarding, severe generalized weakness, oriented x 3, face symmetric, speech fluent, pleasant demeanor        Disposition: Status is: Inpatient 59 y.o. 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  Now getting weaker.  Will need radiation therapy to the spine until Jan 20, then can discharge to SNF on Jan 20        Author: [name], MD 11/16/2024 5:17 PM  For on call review [email or web address].    "

## 2024-11-16 NOTE — Progress Notes (Signed)
 Physical Therapy Treatment Patient Details Name: Carla Hanson MRN: [id number] DOB: 11-23-1965 Today's Date: 11/16/2024   History of Present Illness Pt admitted with acute respiratory failure due to bilateral pleural effusions, T6 compression fx with mild cord compression in the setting of refractory multiple myeloma. Neurosurgery does not recommend surgical intervention. 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 PMHx includes HTN, HLD, LLE DVT, CVA w/o residual deficits, AFIB, bronchitis, PNA, COPD, PE, cystectomy, multiple myeloma (currently under tx), OA, anxiety, bipolar    PT Comments   Pt admitted with above diagnosis.  Pt currently with functional limitations due to the deficits listed below (see PT Problem List). Pt in bed s/p radiation and reported feeling a little shaky, expressed fear of falling and anxiety associated with movement and distrain for recliner. Pt required some encouragement for participation with therapy today and reported preference of earlier am sessions, pt is aware that PT will try and accommodate pt however not a guarantee with verbalizing understanding. Pt required CGA and use of hospital bed features for supine to sit, multiple standing trials from EOB with A x 2, pt tolerated modified HHA x 2/mod A x 2 well and able to side step with min A x 2 2 feet with cues. Pt left in bed, all needs in place and encouraged to order lunch. Patient will benefit from continued inpatient follow up therapy, <3 hours/day.  Pt will benefit from acute skilled PT to increase their independence and safety with mobility to allow discharge.      If plan is discharge home, recommend the following: Two people to help with walking and/or transfers;A lot of help with bathing/dressing/bathroom;Assistance with cooking/housework;Assist for transportation;Help with stairs or ramp for entrance   Can travel by private  vehicle     No  Equipment Recommendations  Hospital bed;Hoyer lift (if pt returns to home)    Recommendations for Other Services       Precautions / Restrictions Precautions Precautions: Fall;Back;Other (comment) Recall of Precautions/Restrictions: Impaired Precaution/Restrictions Comments: spinal precautions for comfort, may use TLSO for comfort per MD (brace not ordered, pt does not experience back pain throughout eval) Required Braces or Orthoses: Other Brace Other Brace: no TLSO in room Restrictions Weight Bearing Restrictions Per Provider Order: No     Mobility  Bed Mobility Overal bed mobility: Needs Assistance Bed Mobility: Supine to Sit, Sit to Supine     Supine to sit: Contact guard, HOB elevated Sit to supine: Max assist, +2 for physical assistance, +2 for safety/equipment   General bed mobility comments: increased time, cues and use of hospital bed for supine to sit, max A x 2 to return to bed due to fatigue, pt is able to perofrm bridging in bed to reposition    Transfers Overall transfer level: Needs assistance Equipment used: 2 person hand held assist, Rolling walker (2 wheels) Transfers: Sit to/from Stand Sit to Stand: Max assist, +2 physical assistance, +2 safety/equipment, From elevated surface           General transfer comment: standing trial x 2  with pull to stand at Rw and with max A x 2 unable to clear buttocks during the course of bed mobiltiy and standing attempts pt voided bladder and migraded toward EOB and unable to assist to reposition safely and more posteriorly on bed with PT thus bear hugging and lifting pt and placing in more appropriate location on EOB, fouth standing trial with modified HHA  x 2 and mod A x 2 for sit to stand, min A x 2 to maintain statind balance no reports of back pain with above technique and pt able to side step to the R for 2 feet for improved positioning upon return to bed. pt indicated feeling successful and glad that  she was able to try and stand up    Ambulation/Gait                   Stairs             Wheelchair Mobility     Tilt Bed    Modified Rankin (Stroke Patients Only)       Balance Overall balance assessment: Needs assistance Sitting-balance support: Feet supported Sitting balance-Leahy Scale: Good     Standing balance support: Bilateral upper extremity supported, During functional activity, Reliant on assistive device for balance Standing balance-Leahy Scale: Zero Standing balance comment: min A x 2 with modified HHA and cues with knees blocked initally                            Communication Communication Communication: No apparent difficulties  Cognition Arousal: Alert Behavior During Therapy: WFL for tasks assessed/performed   PT - Cognitive impairments: No apparent impairments                         Following commands: Intact      Cueing Cueing Techniques: Verbal cues, Tactile cues  Exercises      General Comments        Pertinent Vitals/Pain Pain Assessment Pain Assessment: Faces Faces Pain Scale: Hurts little more Pain Location: back Pain Descriptors / Indicators: Aching, Discomfort, Grimacing Pain Intervention(s): Monitored during session, Repositioned    Home Living                          Prior Function            PT Goals (current goals can now be found in the care plan section) Acute Rehab PT Goals Patient Stated Goal: want to get up and walk Progress towards PT goals: Progressing toward goals    Frequency    Min 3X/week      PT Plan      Co-evaluation              AM-PAC PT 6 Clicks Mobility   Outcome Measure  Help needed turning from your back to your side while in a flat bed without using bedrails?: A Little Help needed moving from lying on your back to sitting on the side of a flat bed without using bedrails?: A Little Help needed moving to and from a bed to a  chair (including a wheelchair)?: A Lot Help needed standing up from a chair using your arms (e.g., wheelchair or bedside chair)?: Total Help needed to walk in hospital room?: Total Help needed climbing 3-5 steps with a railing? : Total 6 Click Score: 11    End of Session Equipment Utilized During Treatment: Gait belt Activity Tolerance: Patient tolerated treatment well Patient left: with call bell/phone within reach;in bed;with bed alarm set Nurse Communication: Mobility status;Need for lift equipment PT Visit Diagnosis: Unsteadiness on feet (R26.81);Muscle weakness (generalized) (M62.81);Other abnormalities of gait and mobility (R26.89)     Time: 8866-8847 PT Time Calculation (min) (ACUTE ONLY): 19 min  Charges:    $Therapeutic Activity:  8-22 mins PT General Charges $$ ACUTE PT VISIT: 1 Visit                     [place], PT Acute Rehab    [place] VEAR Drone 11/16/2024, 12:23 PM

## 2024-11-16 NOTE — Plan of Care (Signed)
  Problem: Education: Goal: Knowledge of General Education information will improve Description: Including pain rating scale, medication(s)/side effects and non-pharmacologic comfort measures Outcome: Progressing   Problem: Health Behavior/Discharge Planning: Goal: Ability to manage health-related needs will improve Outcome: Progressing   Problem: Clinical Measurements: Goal: Ability to maintain clinical measurements within normal limits will improve Outcome: Progressing Goal: Will remain free from infection Outcome: Progressing Goal: Diagnostic test results will improve Outcome: Progressing Goal: Respiratory complications will improve Outcome: Progressing Goal: Cardiovascular complication will be avoided Outcome: Progressing   Problem: Activity: Goal: Risk for activity intolerance will decrease Outcome: Progressing   Problem: Nutrition: Goal: Adequate nutrition will be maintained Outcome: Progressing   Problem: Pain Managment: Goal: General experience of comfort will improve and/or be controlled Outcome: Progressing

## 2024-11-16 NOTE — TOC Progression Note (Signed)
 Transition of Care [facility]) - Progression Note    Patient Details  Name: Carla Hanson MRN: [id number] Date of Birth: 1966-04-16  Transition of Care [facility]) CM/SW Contact  Toy LITTIE Agar, RN Phone Number:[phone]  11/16/2024, 1:50 PM  Clinical Narrative:    Patient has been faxed out for bed offers. Currently no offers.  [facility] AND REHAB[facility]ATION, [facility] Preferred SNF  Declined No medicaid beds -- [address], [place] [address] [phone] --  [facility] Preferred SNF  Declined Out of network -- [address], [place] [address] [phone] --  [facility]  Declined Insurance not in network -- [address] [place] [address] [phone] --  [facility]  Declined Insurance not in network -- [address], [place] [address] [phone] --  [facility] Preferred SNF  Declined No bed availablity -- [address] [place] [address] [phone] --  [facility] Preferred SNF  Declined No bed availablity -- [address], [place] [address] [phone] --  [facility]  Declined Facility full -- [address] [place] [address] [phone] --  [facility]  Declined Facility full -- [address] [place] [address] [phone] --  [facility] Preferred SNF  Declined Insurance not in network -- [address] [place] [address] [phone] --  [facility] AND REHABILITATION [facility] Preferred SNF  Declined -- [address], [place] [address] [phone] --  [facility] CARE SNF  Declined Out of network -- [address], [place] [address] [phone] --     Expected Discharge Plan: Home w Home Health Services Barriers to  Discharge: Continued Medical Work up               Expected Discharge Plan and Services In-house Referral: NA Discharge Planning Services: CM Consult Post Acute Care Choice:  (patient states that she has HH PT /OT/RN/ (BSC , shower chair, rollator, walker, wheelchair)) Living arrangements for the past 2 months: Single Family Home                 DME Arranged: N/A DME Agency: NA       HH Arranged: NA HH Agency: NA         Social Drivers of Health (SDOH) Interventions SDOH Screenings   Food Insecurity: No Food Insecurity (11/07/2024)  Housing: Low Risk (11/07/2024)  Transportation Needs: No Transportation Needs (11/07/2024)  Utilities: Not At Risk (11/07/2024)  Depression (PHQ2-9): Low Risk (09/08/2024)  Financial Resource Strain: Low Risk (05/14/2024)  Physical Activity: Insufficiently Active (05/14/2024)  Social Connections: Moderately Integrated (10/20/2024)  Stress: No Stress Concern Present (05/14/2024)  Tobacco Use: Medium Risk (11/08/2024)    Readmission Risk Interventions    11/09/2024    3:02 PM 10/26/2024    2:05 PM 02/03/2024    1:14 PM  Readmission Risk Prevention Plan  Transportation Screening Complete Complete Complete  PCP or Specialist Appt within 3-5 Days Complete Complete Complete  HRI or Home Care Consult Complete Complete   Social Work Consult for Recovery Care Planning/Counseling Complete Complete Complete  Palliative Care Screening Not Applicable  Not Applicable  Medication Review Ocean[facility]) Referral to Pharmacy Complete Complete

## 2024-11-17 ENCOUNTER — Other Ambulatory Visit: Payer: Self-pay

## 2024-11-17 ENCOUNTER — Ambulatory Visit

## 2024-11-17 DIAGNOSIS — C787 Secondary malignant neoplasm of liver and intrahepatic bile duct: Secondary | ICD-10-CM

## 2024-11-17 DIAGNOSIS — C9000 Multiple myeloma not having achieved remission: Secondary | ICD-10-CM | POA: Diagnosis not present

## 2024-11-17 DIAGNOSIS — G9520 Unspecified cord compression: Secondary | ICD-10-CM | POA: Diagnosis not present

## 2024-11-17 DIAGNOSIS — J9601 Acute respiratory failure with hypoxia: Secondary | ICD-10-CM | POA: Diagnosis not present

## 2024-11-17 DIAGNOSIS — D696 Thrombocytopenia, unspecified: Secondary | ICD-10-CM | POA: Diagnosis not present

## 2024-11-17 DIAGNOSIS — D509 Iron deficiency anemia, unspecified: Secondary | ICD-10-CM

## 2024-11-17 DIAGNOSIS — J90 Pleural effusion, not elsewhere classified: Secondary | ICD-10-CM | POA: Diagnosis not present

## 2024-11-17 DIAGNOSIS — S22050A Wedge compression fracture of T5-T6 vertebra, initial encounter for closed fracture: Secondary | ICD-10-CM | POA: Diagnosis not present

## 2024-11-17 LAB — CBC
HCT: 27.5 % — ABNORMAL LOW (ref 36.0–46.0)
Hemoglobin: 8.7 g/dL — ABNORMAL LOW (ref 12.0–15.0)
MCH: 28.1 pg (ref 26.0–34.0)
MCHC: 31.6 g/dL (ref 30.0–36.0)
MCV: 88.7 fL (ref 80.0–100.0)
Platelets: 31 K/uL — ABNORMAL LOW (ref 150–400)
RBC: 3.1 MIL/uL — ABNORMAL LOW (ref 3.87–5.11)
RDW: 15.7 % — ABNORMAL HIGH (ref 11.5–15.5)
WBC: 10.8 K/uL — ABNORMAL HIGH (ref 4.0–10.5)
nRBC: 10.8 % — ABNORMAL HIGH (ref 0.0–0.2)

## 2024-11-17 LAB — COMPREHENSIVE METABOLIC PANEL WITH GFR
ALT: 26 U/L (ref 0–44)
AST: 32 U/L (ref 15–41)
Albumin: 3.1 g/dL — ABNORMAL LOW (ref 3.5–5.0)
Alkaline Phosphatase: 110 U/L (ref 38–126)
Anion gap: 8 (ref 5–15)
BUN: 20 mg/dL (ref 6–20)
CO2: 26 mmol/L (ref 22–32)
Calcium: 8.5 mg/dL — ABNORMAL LOW (ref 8.9–10.3)
Chloride: 100 mmol/L (ref 98–111)
Creatinine, Ser: 0.43 mg/dL — ABNORMAL LOW (ref 0.44–1.00)
GFR, Estimated: >60 mL/min
Glucose, Bld: 145 mg/dL — ABNORMAL HIGH (ref 70–99)
Potassium: 4.6 mmol/L (ref 3.5–5.1)
Sodium: 134 mmol/L — ABNORMAL LOW (ref 135–145)
Total Bilirubin: 0.4 mg/dL (ref 0.0–1.2)
Total Protein: 6 g/dL — ABNORMAL LOW (ref 6.5–8.1)

## 2024-11-17 LAB — RAD ONC ARIA SESSION SUMMARY
Course Elapsed Days: 7
Plan Fractions Treated to Date: 6
Plan Prescribed Dose Per Fraction: 2.5 Gy
Plan Total Fractions Prescribed: 10
Plan Total Prescribed Dose: 25 Gy
Reference Point Dosage Given to Date: 15 Gy
Reference Point Session Dosage Given: 2.5 Gy
Session Number: 6

## 2024-11-17 NOTE — Progress Notes (Signed)
 PT Cancellation Note  Patient Details Name: [name] MRN: [id number] DOB: 07-06-1966   Cancelled Treatment:    Reason Eval/Treat Not Completed: Patient declined, no reason specified. PT attempted to see pt prior to leaving for radiation treatment, however pt declined stating she was too tired and having stomach pain. PT noted radiation schedule in Rehab Sticky Note to assist with coordinating future treatment sessions.  Isaiah DEL. Ayen Viviano, PT, DPT   [employer] 11/17/2024, 10:34 AM

## 2024-11-17 NOTE — Progress Notes (Signed)
 " PROGRESS NOTE  Carla Hanson FMW:[id number] DOB: 1966/10/10 DOA: 11/07/2024 PCP: Anders Otto DASEN, MD   LOS: 10 days   Brief Narrative / Interim history: 59 year old female history of obesity, HTN, COPD, prior DVT and SAH no longer anticoagulation, relapsed refractory multiple myeloma who comes into the hospital with shortness of breath, found to have pleural effusion, also found to have worsening T6 compression fracture.  Oncology and radiation oncology consulted, currently undergoing XRT  Subjective / 24h Interval events: She is doing well this morning.  She denies any chest pain, no shortness of breath.  No significant pain.  Her mother is at bedside  Assesement and Plan: Principal problem Compression fracture of the T6 -on recent imaging this appears worse, initially diagnosed in 2025.  There is concern for progression of spinal cord impingement.  She has no extremity weakness, bowel or bladder dysfunction.  Radiation oncology consulted, she is getting XRT.  She has been placed on Decadron , continue  Active problems Acute respiratory failure hypoxia, possible pneumonia-status post 5 days of Rocephin  and doxycycline  as well as diuretics.  Now on room air.  Effusions resolved on chest x-ray  Thrombocytopenia-platelet transfusion for less than 10K or less than 50K with bleeding.  This is likely in the setting of underlying malignancy.  Trend CBC  Anemia-no bleeding, hemoglobin overall stable  PA flutter-continue amiodarone , diltiazem .  Not on anticoagulation due to thrombocytopenia and history of SAH  Pressure injury of skin-stage II, left buttock, POA  Acute on chronic diastolic CHF-status post IV Lasix , currently appears euvolemic.  Multiple myeloma-seeing hematology as an outpatient.  Hypercalcemia of malignancy-status post 7 days of calcitonin  Hypokalemia-continue to supplement potassium and monitor  COPD-stable, no wheezing  History of PE-not on anticoagulation for  reasons above  Essential hypertension-continue diltiazem ,  Obesity, morbid-BMI over 40.  She would benefit from weight loss  Scheduled Meds:  acyclovir   400 mg Oral BID   amiodarone   200 mg Oral BID   dexamethasone   4 mg Oral Q12H   diltiazem   240 mg Oral Daily   ferrous sulfate   325 mg Oral Q breakfast   fluticasone  furoate-vilanterol  1 puff Inhalation Daily   folic acid   1 mg Oral BID   polyethylene glycol  17 g Oral Daily   potassium chloride   20 mEq Oral Daily   thiamine   100 mg Oral Daily   Continuous Infusions: PRN Meds:.acetaminophen  **OR** acetaminophen , albuterol , alum & mag hydroxide-simeth, bisacodyl , HYDROmorphone  (DILAUDID ) injection, mouth rinse, oxyCODONE   Current Outpatient Medications  Medication Instructions   acyclovir  (ZOVIRAX ) 400 mg, Oral, 2 times daily   albuterol  (PROVENTIL ) 2.5 mg, Nebulization, Every 6 hours PRN   albuterol  (VENTOLIN  HFA) 108 (90 Base) MCG/ACT inhaler 2 puffs, Inhalation, Every 6 hours PRN   amiodarone  (PACERONE ) 200 mg, Oral, 2 times daily   calcitonin, salmon, (MIACALCIN /FORTICAL) 200 UNIT/ACT nasal spray 1 spray, Alternating Nares, Daily   cetirizine  (ZYRTEC ) 10 mg, Oral, Daily   diltiazem  (CARDIZEM  CD) 240 mg, Oral, Daily   ferrous sulfate  325 mg, Oral, Daily with breakfast   folic acid  (FOLVITE ) 1 mg, Oral, 2 times daily   potassium chloride  SA (KLOR-CON  M) 20 MEQ tablet 20 mEq, Oral, 2 times daily   prochlorperazine  (COMPAZINE ) 10 MG tablet Take 1 tablet (10 mg total) by mouth every 6 (six) hours as needed for nausea or vomiting   SYMBICORT  160-4.5 MCG/ACT inhaler Inhalation   SYSTANE ULTRA 0.4-0.3 % SOLN 1 drop, Both Eyes, 3 times daily PRN  thiamine  (VITAMIN B1) 100 mg, Oral, Daily   tiZANidine  (ZANAFLEX ) 4 mg, Oral, 2 times daily PRN   TYLENOL  500-1,000 mg, Oral, Every 6 hours PRN    Diet Orders (From admission, onward)     Start     Ordered   11/07/24 2056  Diet Heart Room service appropriate? Yes; Fluid consistency:  Thin; Fluid restriction: 1500 mL Fluid  Diet effective now       Question Answer Comment  Room service appropriate? Yes   Fluid consistency: Thin   Fluid restriction: 1500 mL Fluid      11/07/24 2056            DVT prophylaxis: SCDs Start: 11/07/24 2056   Lab Results  Component Value Date   PLT 31 (L) 11/17/2024      Code Status: Full Code  Family Communication: Patient's mother present at bedside  Status is: Inpatient Remains inpatient appropriate because: Radiation therapy   Level of care: Med-Surg  Consultants:  Radiation oncology Palliative care  Objective: Vitals:   11/16/24 1306 11/16/24 2204 11/17/24 0658 11/17/24 0958  BP: 132/82 134/83 (!) 141/88   Pulse: 73 72 70   Resp: 18 18 18    Temp: 97.6 F (36.4 C)  98.1 F (36.7 C)   TempSrc: Oral     SpO2: 100% 98% 97% 97%  Weight:      Height:        Intake/Output Summary (Last 24 hours) at 11/17/2024 1316 Last data filed at 11/17/2024 1100 Gross per 24 hour  Intake 960 ml  Output 700 ml  Net 260 ml   Wt Readings from Last 3 Encounters:  11/07/24 92.6 kg  10/20/24 92.6 kg  09/22/24 96.6 kg    Examination:  Constitutional: NAD Eyes: no scleral icterus ENMT: Mucous membranes are moist.  Neck: normal, supple Respiratory: clear to auscultation bilaterally, no wheezing, no crackles.  Cardiovascular: Regular rate and rhythm, no murmurs / rubs / gallops. No LE edema.  Abdomen: non distended, no tenderness. Bowel sounds positive.  Musculoskeletal: no clubbing / cyanosis.    Data Reviewed: I have independently reviewed following labs and imaging studies   CBC Recent Labs  Lab 11/11/24 0727 11/12/24 0700 11/13/24 0640 11/14/24 0654 11/17/24 0716  WBC 12.0* 13.7* 15.6* 15.6* 10.8*  HGB 7.4* 7.9* 8.6* 8.9* 8.7*  HCT 23.4* 26.0* 27.8* 28.6* 27.5*  PLT 16* 17* 21* 22* 31*  MCV 89.0 90.6 90.6 89.9 88.7  MCH 28.1 27.5 28.0 28.0 28.1  MCHC 31.6 30.4 30.9 31.1 31.6  RDW 15.7* 15.7* 15.8*  15.7* 15.7*    Recent Labs  Lab 11/11/24 0727 11/12/24 0700 11/14/24 0654 11/17/24 0716  NA 140 140 136 134*  K 3.4* 4.5 4.4 4.6  CL 101 100 98 100  CO2 32 32 29 26  GLUCOSE 173* 158* 154* 145*  BUN 19 18 18 20   CREATININE 0.47 0.45 0.44 0.43*  CALCIUM  9.3 9.1 8.3* 8.5*  AST 45* 37  --  32  ALT 8 9  --  26  ALKPHOS 142* 143*  --  110  BILITOT 0.3 0.3  --  0.4  ALBUMIN 3.0* 3.2*  --  3.1*    ------------------------------------------------------------------------------------------------------------------ No results for input(s): CHOL, HDL, LDLCALC, TRIG, CHOLHDL, LDLDIRECT in the last 72 hours.  Lab Results  Component Value Date   HGBA1C 6.6 (H) 08/04/2024   ------------------------------------------------------------------------------------------------------------------ No results for input(s): TSH, T4TOTAL, T3FREE, THYROIDAB in the last 72 hours.  Invalid input(s): FREET3  Cardiac Enzymes  No results for input(s): CKMB, TROPONINI, MYOGLOBIN in the last 168 hours.  Invalid input(s): CK ------------------------------------------------------------------------------------------------------------------    Component Value Date/Time   BNP 50.0 11/01/2016 0535    CBG: No results for input(s): GLUCAP in the last 168 hours.  Recent Results (from the past 240 hours)  Resp panel by RT-PCR (RSV, Flu A&B, Covid) Anterior Nasal Swab     Status: None   Collection Time: 11/07/24  5:00 PM   Specimen: Anterior Nasal Swab  Result Value Ref Range Status   SARS Coronavirus 2 by RT PCR NEGATIVE NEGATIVE Final    Comment: (NOTE) SARS-CoV-2 target nucleic acids are NOT DETECTED.  The SARS-CoV-2 RNA is generally detectable in upper respiratory specimens during the acute phase of infection. The lowest concentration of SARS-CoV-2 viral copies this assay can detect is 138 copies/mL. A negative result does not preclude SARS-Cov-2 infection and should  not be used as the sole basis for treatment or other patient management decisions. A negative result may occur with  improper specimen collection/handling, submission of specimen other than nasopharyngeal swab, presence of viral mutation(s) within the areas targeted by this assay, and inadequate number of viral copies(<138 copies/mL). A negative result must be combined with clinical observations, patient history, and epidemiological information. The expected result is Negative.  Fact Sheet for Patients:  bloggercourse.com  Fact Sheet for Healthcare Providers:  seriousbroker.it  This test is no t yet approved or cleared by the [place]  FDA and  has been authorized for detection and/or diagnosis of SARS-CoV-2 by FDA under an Emergency Use Authorization (EUA). This EUA will remain  in effect (meaning this test can be used) for the duration of the COVID-19 declaration under Section 564(b)(1) of the Act, 21 U.S.C.section 360bbb-3(b)(1), unless the authorization is terminated  or revoked sooner.       Influenza A by PCR NEGATIVE NEGATIVE Final   Influenza B by PCR NEGATIVE NEGATIVE Final    Comment: (NOTE) The Xpert Xpress SARS-CoV-2/FLU/RSV plus assay is intended as an aid in the diagnosis of influenza from Nasopharyngeal swab specimens and should not be used as a sole basis for treatment. Nasal washings and aspirates are unacceptable for Xpert Xpress SARS-CoV-2/FLU/RSV testing.  Fact Sheet for Patients: bloggercourse.com  Fact Sheet for Healthcare Providers: seriousbroker.it  This test is not yet approved or cleared by the [place]  FDA and has been authorized for detection and/or diagnosis of SARS-CoV-2 by FDA under an Emergency Use Authorization (EUA). This EUA will remain in effect (meaning this test can be used) for the duration of the COVID-19 declaration under  Section 564(b)(1) of the Act, 21 U.S.C. section 360bbb-3(b)(1), unless the authorization is terminated or revoked.     Resp Syncytial Virus by PCR NEGATIVE NEGATIVE Final    Comment: (NOTE) Fact Sheet for Patients: bloggercourse.com  Fact Sheet for Healthcare Providers: seriousbroker.it  This test is not yet approved or cleared by the [place]  FDA and has been authorized for detection and/or diagnosis of SARS-CoV-2 by FDA under an Emergency Use Authorization (EUA). This EUA will remain in effect (meaning this test can be used) for the duration of the COVID-19 declaration under Section 564(b)(1) of the Act, 21 U.S.C. section 360bbb-3(b)(1), unless the authorization is terminated or revoked.  Performed at [facility], [address] [place], [place] [address]      Radiology Studies: No results found.   Nilda Fendt, MD, PhD [facility]  Between 7 am - 7 pm I am available, please  contact me via Amion (for emergencies) or Securechat (non urgent messages)  Between 7 pm - 7 am I am not available, please contact night coverage MD/APP via Amion  "

## 2024-11-17 NOTE — Plan of Care (Signed)
   Problem: Education: Goal: Knowledge of General Education information will improve Description: Including pain rating scale, medication(s)/side effects and non-pharmacologic comfort measures Outcome: Progressing   Problem: Activity: Goal: Risk for activity intolerance will decrease Outcome: Progressing   Problem: Coping: Goal: Level of anxiety will decrease Outcome: Progressing

## 2024-11-17 NOTE — Plan of Care (Signed)

## 2024-11-17 NOTE — Progress Notes (Signed)
 Carla Hanson   DOB:14-Sep-1966   FM#:[id number]      CLINICAL SUMMARY:  Carla Hanson  is a 59 year old female patient admitted on 10/20/2024 with complaints of weakness. Oncologic history is significant for multiple myeloma with bone mets. Medical Oncology following.   ONCOLOGIC HISTORY:  - Patient is diagnosed with refractory IgG lambda multiple myeloma. - Status post multiple chemo lines.  She was started on carfilzomib  + cyclophosphamide  + Dex.  Patient missed many treatments due to personal issues.  The plan is to continue outpatient carfilzomib  and hold Cytoxan . - Due to concerns for progression of disease, we recommended repeat bone marrow biopsy.  - Plan referral to [facility] for consideration of BITE therapy. - However she has been admitted due to multiple complaints including most recently on 11/07/2024 for acute hypoxic respiratory failure.    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  Confirmed hypercellular bone marrow 80% with markedly increased plasma cells. - Plan is to refer to [facility] for bite therapy.  Patient reports that she wants to proceed with this plan.  Discussed compliance issues, patient reports that she missed previous oncology appointments due to church issues.  However restates that she wants to proceed with bite therapy. -Medical oncology/Dr. Onesimo following closely  Thoracic cord compression - Per imaging - Patient was seen by neurosurgery with no surgical intervention planned - Seen by radiation oncology, 10 fractions planned and in progress  Shortness of breath Pleural effusion -Patient admitted 11/07/2024 with complaints of shortness of breath and low oxygen levels. - Respiratory status has improved significantly.  Indeed patient is seen today and not on supplemental O2. - Continue to monitor respiratory status   Thoracic cord compression -CT  scan also showed mild thoracic cord compression - Seen by neurosurgery, no surgical intervention planned. - Continue supportive care   Thrombocytopenia -Platelets improving, 31K today - Patient has had chronically low platelets - Recommend platelet transfusion for counts <10K or <50 K with bleeding - Continue to monitor CBC with differential   Anemia History of iron deficiency anemia and folate deficiency - Hemoglobin stable 8.7 today - No transfusional intervention required at this time - Continue ferrous sulfate  and folic acid  - Continue to monitor CBC with differential   HCOM -Calcium  has improved, 8.5 today with adjusted calcium  9.4 - Status post Zometa  - Continue to monitor calcium  levels and CMP      Code Status Full  Subjective:  Patient seen awake and alert laying in bed.  Just returned from radiation therapy.  States that she feels much better than she has felt before.  Expresses desire to go to rehab facility so that she can get stronger.  Also states that she wishes to proceed with bite therapy at [facility]  No other acute complaints offered.  Objective:   Intake/Output Summary (Last 24 hours) at 11/17/2024 1008 Last data filed at 11/17/2024 0843 Gross per 24 hour  Intake 960 ml  Output 550 ml  Net 410 ml     PHYSICAL EXAMINATION: ECOG PERFORMANCE STATUS: 3 - Symptomatic, >50% confined to bed  Vitals:   11/17/24 0658 11/17/24 0958  BP: (!) 141/88   Pulse: 70   Resp: 18   Temp: 98.1 F (36.7 C)   SpO2: 97% 97%   Filed Weights   11/07/24 2308  Weight: 204 lb 2.3 oz (92.6 kg)    GENERAL: alert, no distress and  comfortable SKIN: skin color, texture, turgor are normal, + bilateral UE bruises improving significantly EYES: normal, conjunctiva are pink and non-injected, sclera clear OROPHARYNX: no exudate, no erythema and lips, buccal mucosa, and tongue normal  NECK: supple, thyroid  normal size, non-tender, without nodularity LYMPH: no palpable  lymphadenopathy in the cervical, axillary or inguinal LUNGS: clear to auscultation and percussion with normal breathing effort HEART: regular rate & rhythm and no murmurs and no lower extremity edema ABDOMEN: abdomen soft, non-tender and normal bowel sounds MUSCULOSKELETAL: no cyanosis of digits and no clubbing  PSYCH: alert & oriented x 3 with fluent speech NEURO: no focal motor/sensory deficits   All questions were answered. The patient knows to call the clinic with any problems, questions or concerns.   I personally spent a total of 40 minutes minutes in the care of the patient today including preparing to see the patient, getting/reviewing separately obtained history, counseling and educating, referring and communicating with other health care professionals, documenting clinical information in the EHR, communicating results, and coordinating care.    Olam PARAS Rafay Dahan, NP 11/17/2024 10:08 AM    Labs Reviewed:  Lab Results  Component Value Date   WBC 10.8 (H) 11/17/2024   HGB 8.7 (L) 11/17/2024   HCT 27.5 (L) 11/17/2024   MCV 88.7 11/17/2024   PLT 31 (L) 11/17/2024   Recent Labs    11/11/24 0727 11/12/24 0700 11/14/24 0654 11/17/24 0716  NA 140 140 136 134*  K 3.4* 4.5 4.4 4.6  CL 101 100 98 100  CO2 32 32 29 26  GLUCOSE 173* 158* 154* 145*  BUN 19 18 18 20   CREATININE 0.47 0.45 0.44 0.43*  CALCIUM  9.3 9.1 8.3* 8.5*  GFRNONAA >60 >60 >60 >60  PROT 6.7 7.1  --  6.0*  ALBUMIN 3.0* 3.2*  --  3.1*  AST 45* 37  --  32  ALT 8 9  --  26  ALKPHOS 142* 143*  --  110  BILITOT 0.3 0.3  --  0.4    Studies Reviewed:   DG Chest 1 View Result Date: 11/09/2024 EXAM: 1 VIEW(S) XRAY OF THE CHEST 11/09/2024 03:29:00 PM COMPARISON: 11/07/2024 CLINICAL HISTORY: Pleural effusion FINDINGS: LUNGS AND PLEURA: Mild bilateral interstitial opacities. Decreased small left pleural effusion. Unchanged small right pleural effusion. No pneumothorax. HEART AND MEDIASTINUM: Cardiomegaly. Aortic  atherosclerosis. BONES AND SOFT TISSUES: No acute osseous abnormality. IMPRESSION: 1. Decreased small left pleural effusion and unchanged small right pleural effusion. 2. Mild bilateral interstitial opacities. 3. Cardiomegaly and aortic atherosclerosis. Electronically signed by: Greig Pique MD 11/09/2024 09:18 PM EST RP Workstation: [id number]   MR THORACIC SPINE W WO CONTRAST Result Date: 11/08/2024 EXAM: MRI THORACIC SPINE WITH AND WITHOUT INTRAVENOUS CONTRAST 11/08/2024 12:08:51 PM TECHNIQUE: Multiplanar multisequence MRI of the thoracic spine was performed with and without the administration of 10 mL gadobutrol  (GADAVIST ) 1 MMOL/ML injection. COMPARISON: CT of the chest dated 11/07/2024. CLINICAL HISTORY: Spine metastases, thoracic, monitor. FINDINGS: BONES AND ALIGNMENT: Normal alignment. The thoracic vertebrae are all diffusely reduced in T1 signal intensity and/or heterogeneous in signal intensity on T2. There is a vertebral planar deformity of T6 again demonstrated. There are pathological fractures of T3 and T4. There is a downward bone deformity of the superior endplate of T12. The vertebral bodies also demonstrate heterogeneous contrast enhancement. The findings are highly suggestive of widespread metastatic disease involving the thoracic spine, epidural space, and paraspinous regions, with associated pathological fractures, vertebral deformities, and spinal canal stenosis. Follow-up imaging, potentially with  a dedicated whole-body bone scan or PET/CT, may be considered for further assessment of disease extent. Clinical correlation with known primary malignancy is essential. SPINAL CORD: Normal spinal cord volume. Normal spinal cord signal. There are no definite spinal cord lesions. SOFT TISSUES: There is extensive enhancing epidural and paraspinous metastatic disease, which is resulting in multilevel spinal canal stenosis. There are mild bilateral pleural effusions. DEGENERATIVE CHANGES: No significant  disc herniation. At T6, there is posterior bowing of the posterior wall of the vertebral body, which abuts the ventral surface of the spinal cord. There is multilevel spinal canal stenosis resulting from extensive enhancing epidural and paraspinous metastatic disease. IMPRESSION: 1. Extensive enhancing epidural and paraspinous disease with diffuse abnormal/enhancing thoracic marrow signal resulting in multilevel spinal canal stenosis; at T6 there is posterior vertebral body bowing abutting the ventral cord without definite intramedullary cord lesion. Overall appearance is consistent with multiple myeloma. Recommend urgent neurosurgical and/or radiation oncology consultation to assess for impending cord compression. 2. Pathological fractures of T3 and T4, vertebral planar deformity of T6, and downward deformity of the superior endplate of T12. 3. Mild bilateral pleural effusions. Electronically signed by: Evalene Coho MD 11/08/2024 12:44 PM EST RP Workstation: [id number]   CT CHEST WO CONTRAST Result Date: 11/07/2024 EXAM: CT CHEST WITHOUT CONTRAST 11/07/2024 07:57:35 PM TECHNIQUE: CT of the chest was performed without the administration of intravenous contrast. Multiplanar reformatted images are provided for review. Automated exposure control, iterative reconstruction, and/or weight based adjustment of the mA/kV was utilized to reduce the radiation dose to as low as reasonably achievable. COMPARISON: Portable chest films from 10/31/2024 and 10/26/2024, full body PET CT 02/12/2024, and CTA chest 10/31/2016. CLINICAL HISTORY: Pleural effusion, malignancy suspected. Patient has a history of multiple myeloma. FINDINGS: MEDIASTINUM: Heart is moderately enlarged. There is no pericardial effusion. There are calcifications in the left main and proximal LAD coronary artery. The cardiac blood pool is hypodense consistent with anemia. The pulmonary trunk is prominent measuring 3.9 cm indicating arterial hypertension  (previously 3.8 cm). There is mild prominence of the central veins. Mild aortic atherosclerosis without aneurysm. The great vessels branched normally. There is lobulated substernal soft tissue thickening in the mediastinum which is most likely related to myelomatous involvement of sternum with multiple lytic sternal lesions noted, some with cortical breakthrough posteriorly. The trachea and main bronchi are clear. Central airways are small in caliber, which could be due to active breathing or bronchospasm. LYMPH NODES: There are shotty mediastinal lymph nodes in the AP window, prevascular and subcarinal spaces up to 10 mm. Hilar adenopathy is not well evaluated without contrast. No axillary adenopathy is seen. LUNGS AND PLEURA: There are moderate-sized left greater than right pleural effusions. Some of the left pleural fluid could be loculated anterolaterally. The pleural effusions collapse and obscure the lower lobes on the left greater than right. There is posterior atelectasis in the upper and right middle lobes along the fissures. Linear atelectasis in the bases. There is multifocal lobular pleural thickening in both hemithoraces probably also due to myelomatous involvement affecting the rib cage, otherwise could be other pleural metastases. No pneumothorax. SOFT TISSUES/BONES: There is a 6 mm hypodense nodule in the left lobe of the thyroid . No follow-up imaging is recommended. Generalized edema of the chest wall. There are extensive lytic lesions present throughout the spine, sternum, ribs, and several in the scapulae. There are multiple fractures in various stages of healing of the bilateral ribs. There is a severe age indeterminate pathologic compression fracture of the  T6 vertebral body and a moderate compression fracture of T4, at T6 with vertebra plana, retropulsion and soft tissue extending into the spinal canal, narrowing the thecal sac to 7 mm AP with likely mild compressive effect on the thoracic  cord. . There is a mild upper plate pathologic wedge compression fracture of the L2 vertebral body as well. UPPER ABDOMEN: Limited images of the upper abdomen demonstrate suspected multiple liver masses, not well seen due to beam hardening artifact. There is a nodular surface contour to the liver, which was also not seen previously. . On the last image, there is a vague 2.7 cm hypodense mass in segment 4, and a suspected 1.4 cm periligamentous mass in this segment also in the last image. There are probably other masses obscured by beam hardening. Further evaluation is recommended. IMPRESSION: 1. Moderate-sized left greater than right pleural effusions, with possible partial loculation of the left effusion, and associated multifocal lobular pleural thickening, likely related to myelomatous involvement or other pleural metastases, new since 02/12/2024. 2. Extensive osseous lytic disease with multiple bilateral rib fractures in various stages of healing , and age indeterminate . severe pathologic compression fracture of T6 with vertebra plana, retropulsion, and soft tissue extension into the spinal canal with likely mild thoracic cord compression; additional moderate T4 and mild L2 pathologic compression fractures are present, and thoracic spine MRI is recommended. 3. Suspected multiple liver masses, suboptimally evaluated due to beam hardening artifact, and contrast-enhanced CT or MRI of the abdomen is recommended for further evaluation. 4. The pleural effusions collapse and obscure the lower lobes on the left greater than right. Underlying pneumonia or mass would be difficult to exclude. 5. Cardiomegaly with enlarged pulmonary trunk , prominent central veins , edema of the body wall. No pulmonary edema is seen. 6. Aortic and coronary artery atherosclerosis. Electronically signed by: Francis Quam MD 11/07/2024 08:40 PM EST RP Workstation: [id number]   DG Chest Port 1 View Result Date: 11/07/2024 CLINICAL DATA:   Shortness of breath EXAM: PORTABLE CHEST 1 VIEW COMPARISON:  10/31/2024, PET CT 02/12/2024, chest x-ray 09/08/2024 FINDINGS: Cardiomegaly with vascular congestion. Small right and moderate left pleural effusions, increased compared to prior. Worsened airspace disease at both bases. Aortic atherosclerosis. No pneumothorax IMPRESSION: Cardiomegaly with vascular congestion. Small right and moderate left pleural effusions, increased compared to prior. Worsened airspace disease at both bases may be due to atelectasis or pneumonia. Electronically Signed   By: Luke Bun M.D.   On: 11/07/2024 15:54   DG CHEST PORT 1 VIEW Result Date: 10/31/2024 CLINICAL DATA:  Pleural effusion.  Multiple myeloma. EXAM: PORTABLE CHEST 1 VIEW COMPARISON:  10/26/2024 FINDINGS: Stable cardiomegaly and pulmonary vascular congestion. Stable mild pleural thickening or tiny pleural effusions. No significant change in left lower lobe atelectasis or scarring. No new or worsening areas of pulmonary opacity are seen. IMPRESSION: Stable cardiomegaly and pulmonary vascular congestion. Stable left lower lobe atelectasis or scarring. Stable tiny bilateral pleural effusions versus pleural thickening. Electronically Signed   By: Norleen DELENA Kil M.D.   On: 10/31/2024 09:54   ECHOCARDIOGRAM COMPLETE Result Date: 10/30/2024    ECHOCARDIOGRAM REPORT   Patient Name:   FALLON HAECKER [facility] Date of Exam: 10/30/2024 Medical Rec #:  [id number]      Height:       61.5 in Accession #:    [id number]     Weight:       204.1 lb Date of Birth:  1966/04/19     BSA:  1.917 m Patient Age:    58 years       BP:           126/70 mmHg Patient Gender: F              HR:           86 bpm. Exam Location:  Inpatient Procedure: 2D Echo, Cardiac Doppler, Color Doppler and Intracardiac            Opacification Agent (Both Spectral and Color Flow Doppler were            utilized during procedure). Indications:    Atrial Flutter  History:        Patient has prior history of  Echocardiogram examinations, most                 recent 12/05/2021.  Sonographer:    Philomena Daring Referring Phys: [id number] JOETTE PEBBLES IMPRESSIONS  1. Left ventricular ejection fraction, by estimation, is 60 to 65%. The left ventricle has normal function. The left ventricle has no regional wall motion abnormalities. Left ventricular diastolic parameters are indeterminate.  2. Right ventricular systolic function is normal. The right ventricular size is mildly enlarged. There is mildly elevated pulmonary artery systolic pressure. The estimated right ventricular systolic pressure is 39.9 mmHg.  3. Large pleural effusion in the left lateral region.  4. The mitral valve was not well visualized. No evidence of mitral valve regurgitation.  5. The aortic valve is tricuspid. Aortic valve regurgitation is not visualized. No aortic stenosis is present.  6. The inferior vena cava is dilated in size with <50% respiratory variability, suggesting right atrial pressure of 15 mmHg. Comparison(s): Prior images reviewed side by side. 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 FINDINGS  Left Ventricle: Left ventricular ejection fraction, by estimation, is 60 to 65%. The left ventricle has normal function. The left ventricle has no regional wall motion abnormalities. The left ventricular internal cavity size was normal in size. There is  no left ventricular hypertrophy. Left ventricular diastolic parameters are indeterminate. Right Ventricle: The right ventricular size is mildly enlarged. No increase in right ventricular wall thickness. Right ventricular systolic function is normal. There is mildly elevated pulmonary artery systolic pressure. The tricuspid regurgitant velocity is 2.23 m/s, and with an assumed right atrial pressure of 20 mmHg, the estimated right ventricular systolic pressure is 39.9 mmHg. Left Atrium: Left atrial size was normal in size. Right Atrium: Right atrial size was normal in size. Pericardium: There is  no evidence of pericardial effusion. Mitral Valve: The mitral valve was not well visualized. No evidence of mitral valve regurgitation. Tricuspid Valve: The tricuspid valve is normal in structure. Tricuspid valve regurgitation is trivial. No evidence of tricuspid stenosis. Aortic Valve: The aortic valve is tricuspid. Aortic valve regurgitation is not visualized. No aortic stenosis is present. Aortic valve mean gradient measures 9.0 mmHg. Aortic valve peak gradient measures 18.6 mmHg. Aortic valve area, by VTI measures 2.62  cm. Pulmonic Valve: The pulmonic valve was normal in structure. Pulmonic valve regurgitation is trivial. No evidence of pulmonic stenosis. Aorta: The aortic root and ascending aorta are structurally normal, with no evidence of dilitation. Venous: The inferior vena cava is dilated in size with less than 50% respiratory variability, suggesting right atrial pressure of 15 mmHg. IAS/Shunts: No atrial level shunt detected by color flow Doppler. Additional Comments: There is a large pleural effusion in the left lateral region.  LEFT VENTRICLE PLAX 2D LVIDd:  5.00 cm   Diastology LVIDs:         3.40 cm   LV e' medial:    7.72 cm/s LV PW:         1.00 cm   LV E/e' medial:  12.9 LV IVS:        0.90 cm   LV e' lateral:   11.00 cm/s LVOT diam:     2.00 cm   LV E/e' lateral: 9.1 LV SV:         87 LV SV Index:   45 LVOT Area:     3.14 cm  RIGHT VENTRICLE             IVC RV S prime:     20.40 cm/s  IVC diam: 2.60 cm TAPSE (M-mode): 3.2 cm LEFT ATRIUM             Index        RIGHT ATRIUM           Index LA Vol (A2C):   53.7 ml 28.01 ml/m  RA Area:     17.00 cm LA Vol (A4C):   34.2 ml 17.84 ml/m  RA Volume:   39.90 ml  20.82 ml/m LA Biplane Vol: 43.6 ml 22.75 ml/m  AORTIC VALVE AV Area (Vmax):    2.51 cm AV Area (Vmean):   2.50 cm AV Area (VTI):     2.62 cm AV Vmax:           215.50 cm/s AV Vmean:          137.500 cm/s AV VTI:            0.333 m AV Peak Grad:      18.6 mmHg AV Mean Grad:       9.0 mmHg LVOT Vmax:         172.00 cm/s LVOT Vmean:        109.500 cm/s LVOT VTI:          0.278 m LVOT/AV VTI ratio: 0.83  AORTA Ao Root diam: 3.20 cm Ao Asc diam:  3.00 cm MITRAL VALVE                TRICUSPID VALVE MV Area (PHT): 4.71 cm     TR Peak grad:   19.9 mmHg MV Decel Time: 161 msec     TR Vmax:        223.00 cm/s MV E velocity: 99.80 cm/s MV A velocity: 101.00 cm/s  SHUNTS MV E/A ratio:  0.99         Systemic VTI:  0.28 m                             Systemic Diam: 2.00 cm Stanly Leavens MD Electronically signed by Stanly Leavens MD Signature Date/Time: 10/30/2024/3:14:55 PM    Final    IR BONE MARROW BIOPSY & ASPIRATION Result Date: 10/27/2024 INDICATION: Myeloma, reassess marrow status EXAM: FLUOROSCOPICALLY GUIDED RIGHT ILIAC BONE MARROW ASPIRATION AND CORE BIOPSY Date:  10/27/2024 10/27/2024 10:19 am Radiologist:  M. Frederic Specking, MD Guidance: Fluoroscopy FLUOROSCOPY: Fluoroscopy Time:  (56 mGy). MEDICATIONS: 1% lidocaine  local ANESTHESIA/SEDATION: 1.5 mg IV Versed ; 100 mcg IV Fentanyl  Moderate Sedation Time:  11 minutes The patient was continuously monitored during the procedure by the interventional radiology nurse under my direct supervision. CONTRAST:  None. COMPLICATIONS: None immediate PROCEDURE: Informed consent was obtained from the patient following explanation of the procedure, risks, benefits and  alternatives. The patient understands, agrees and consents for the procedure. All questions were addressed. A time out was performed. The patient was positioned prone and fluoroscopic localization was performed of the pelvis to demonstrate the iliac marrow spaces. Maximal barrier sterile technique utilized including caps, mask, sterile gowns, sterile gloves, large sterile drape, hand hygiene, and Betadine prep. Under sterile conditions and local anesthesia, an 11 gauge coaxial bone biopsy needle was advanced into the right iliac marrow space. Needle position was confirmed with  fluoroscopic imaging. Initially, bone marrow aspiration was performed. Next, the 11 gauge outer cannula was utilized to obtain a right iliac bone marrow core biopsy. Needle was removed. Hemostasis was obtained with compression. The patient tolerated the procedure well. Samples were prepared with the cytotechnologist. No immediate complications. IMPRESSION: Fluoroscopically guided right iliac bone marrow aspiration and core biopsy. Electronically Signed   By: CHRISTELLA.  Shick M.D.   On: 10/27/2024 10:31   DG CHEST PORT 1 VIEW Result Date: 10/26/2024 CLINICAL DATA:  Dyspnea. EXAM: PORTABLE CHEST 1 VIEW COMPARISON:  09/08/2024 FINDINGS: The heart is enlarged. Mediastinal contours are stable. Vascular congestion. There are small bilateral pleural effusions. Ill-defined bilateral infrahilar opacities. No pneumothorax. IMPRESSION: 1. Cardiomegaly with vascular congestion and small bilateral pleural effusions. 2. Ill-defined bilateral infrahilar opacities, atelectasis versus pneumonia. Electronically Signed   By: Andrea Gasman M.D.   On: 10/26/2024 16:52

## 2024-11-17 NOTE — Progress Notes (Signed)
 OT Cancellation Note  Patient Details Name: BONNITA NEWBY MRN: [id number] DOB: 03-25-1966   Cancelled Treatment:    Reason Eval/Treat Not Completed: Patient declined, no reason specified OT and PT attempting to see prior to radiation, however patient citing stomach pain limiting ability to participate. Schedule noted for radiation, and will attempt accommodate morning sessions over the next few days.   Ronal Gift E. Macy Polio, OTR/L Acute Rehabilitation Services [phone]   Ronal Gift Salt 11/17/2024, 10:37 AM

## 2024-11-18 ENCOUNTER — Other Ambulatory Visit: Payer: Self-pay

## 2024-11-18 ENCOUNTER — Ambulatory Visit

## 2024-11-18 ENCOUNTER — Ambulatory Visit (INDEPENDENT_AMBULATORY_CARE_PROVIDER_SITE_OTHER): Admitting: Family Medicine

## 2024-11-18 DIAGNOSIS — J9601 Acute respiratory failure with hypoxia: Secondary | ICD-10-CM | POA: Diagnosis not present

## 2024-11-18 DIAGNOSIS — C9000 Multiple myeloma not having achieved remission: Secondary | ICD-10-CM | POA: Diagnosis not present

## 2024-11-18 DIAGNOSIS — S22050A Wedge compression fracture of T5-T6 vertebra, initial encounter for closed fracture: Secondary | ICD-10-CM | POA: Diagnosis not present

## 2024-11-18 LAB — RAD ONC ARIA SESSION SUMMARY
Course Elapsed Days: 8
Plan Fractions Treated to Date: 7
Plan Prescribed Dose Per Fraction: 2.5 Gy
Plan Total Fractions Prescribed: 10
Plan Total Prescribed Dose: 25 Gy
Reference Point Dosage Given to Date: 17.5 Gy
Reference Point Session Dosage Given: 2.5 Gy
Session Number: 7

## 2024-11-18 NOTE — Progress Notes (Signed)
 PT Cancellation Note  Patient Details Name: Carla Hanson MRN: [id number] DOB: 24-Aug-1966   Cancelled Treatment:    Reason Eval/Treat Not Completed: Other (comment) (attempted PT twice this morning. At 9:30 breakfast had just arrived & pt wanted to eat. At 10:20 pt stated she was waiting for the bedpan. Pt stated she does not want to attempt PT after radiation as she's always too tired.  Will follow.)   Sylvan Delon Copp PT 11/18/2024  Acute Rehabilitation Services  Office [phone]

## 2024-11-18 NOTE — Plan of Care (Signed)

## 2024-11-18 NOTE — Progress Notes (Signed)
 " PROGRESS NOTE  Carla Hanson FMW:[id number] DOB: 16-Aug-1966 DOA: 11/07/2024 PCP: Anders Otto DASEN, MD   LOS: 11 days   Brief Narrative / Interim history: 59 year old female history of obesity, HTN, COPD, prior DVT and SAH no longer anticoagulation, relapsed refractory multiple myeloma who comes into the hospital with shortness of breath, found to have pleural effusion, also found to have worsening T6 compression fracture.  Oncology and radiation oncology consulted, currently undergoing XRT  Subjective / 24h Interval events: Feels overwhelmed about everything that has been going on, tells me that she needs to take some deep breaths.  Assesement and Plan: Principal problem Compression fracture of the T6 -on recent imaging this appears worse, initially diagnosed in 2025.  There is concern for progression of spinal cord impingement.  She has no extremity weakness, bowel or bladder dysfunction.  Radiation oncology consulted, she is getting XRT, about to go to radiation today.  She has been placed on Decadron , continue  Active problems Acute respiratory failure hypoxia, possible pneumonia-status post 5 days of Rocephin  and doxycycline  as well as diuretics.  Remains stable on room air  Thrombocytopenia-platelet transfusion for less than 10K or less than 50K with bleeding.  This is likely in the setting of underlying malignancy.  Trend CBC, platelets 31K this morning  Anemia-no bleeding, hemoglobin overall stable, hemoglobin 8.7  PA flutter-continue amiodarone , diltiazem .  Not on anticoagulation due to thrombocytopenia and history of SAH  Pressure injury of skin-stage II, left buttock, POA  Acute on chronic diastolic CHF-status post IV Lasix , currently appears euvolemic.  Multiple myeloma-seeing hematology as an outpatient.  Hypercalcemia of malignancy-status post 7 days of calcitonin  Hypokalemia-continue to supplement potassium and monitor  COPD-stable, no wheezing  History of  PE-not on anticoagulation for reasons above  Essential hypertension-continue diltiazem ,  Obesity, morbid-BMI over 40.  She would benefit from weight loss  Scheduled Meds:  acyclovir   400 mg Oral BID   amiodarone   200 mg Oral BID   dexamethasone   4 mg Oral Q12H   diltiazem   240 mg Oral Daily   ferrous sulfate   325 mg Oral Q breakfast   fluticasone  furoate-vilanterol  1 puff Inhalation Daily   folic acid   1 mg Oral BID   polyethylene glycol  17 g Oral Daily   potassium chloride   20 mEq Oral Daily   thiamine   100 mg Oral Daily   Continuous Infusions: PRN Meds:.acetaminophen  **OR** acetaminophen , albuterol , alum & mag hydroxide-simeth, bisacodyl , HYDROmorphone  (DILAUDID ) injection, mouth rinse, oxyCODONE   Current Outpatient Medications  Medication Instructions   acyclovir  (ZOVIRAX ) 400 mg, Oral, 2 times daily   albuterol  (PROVENTIL ) 2.5 mg, Nebulization, Every 6 hours PRN   albuterol  (VENTOLIN  HFA) 108 (90 Base) MCG/ACT inhaler 2 puffs, Inhalation, Every 6 hours PRN   amiodarone  (PACERONE ) 200 mg, Oral, 2 times daily   calcitonin, salmon, (MIACALCIN /FORTICAL) 200 UNIT/ACT nasal spray 1 spray, Alternating Nares, Daily   cetirizine  (ZYRTEC ) 10 mg, Oral, Daily   diltiazem  (CARDIZEM  CD) 240 mg, Oral, Daily   ferrous sulfate  325 mg, Oral, Daily with breakfast   folic acid  (FOLVITE ) 1 mg, Oral, 2 times daily   potassium chloride  SA (KLOR-CON  M) 20 MEQ tablet 20 mEq, Oral, 2 times daily   prochlorperazine  (COMPAZINE ) 10 MG tablet Take 1 tablet (10 mg total) by mouth every 6 (six) hours as needed for nausea or vomiting   SYMBICORT  160-4.5 MCG/ACT inhaler Inhalation   SYSTANE ULTRA 0.4-0.3 % SOLN 1 drop, Both Eyes, 3 times daily PRN  thiamine  (VITAMIN B1) 100 mg, Oral, Daily   tiZANidine  (ZANAFLEX ) 4 mg, Oral, 2 times daily PRN   TYLENOL  500-1,000 mg, Oral, Every 6 hours PRN    Diet Orders (From admission, onward)     Start     Ordered   11/07/24 2056  Diet Heart Room service  appropriate? Yes; Fluid consistency: Thin; Fluid restriction: 1500 mL Fluid  Diet effective now       Question Answer Comment  Room service appropriate? Yes   Fluid consistency: Thin   Fluid restriction: 1500 mL Fluid      11/07/24 2056            DVT prophylaxis: SCDs Start: 11/07/24 2056   Lab Results  Component Value Date   PLT 31 (L) 11/17/2024      Code Status: Full Code  Family Communication: Patient's mother present at bedside  Status is: Inpatient Remains inpatient appropriate because: Radiation therapy   Level of care: Med-Surg  Consultants:  Radiation oncology Palliative care  Objective: Vitals:   11/17/24 0958 11/17/24 2132 11/18/24 0525 11/18/24 0854  BP:  (!) 145/83 (!) 147/89   Pulse:  63 63   Resp:  14 14   Temp:  98.4 F (36.9 C) 98.4 F (36.9 C)   TempSrc:  Oral Oral   SpO2: 97% 93% 96% 98%  Weight:      Height:        Intake/Output Summary (Last 24 hours) at 11/18/2024 1247 Last data filed at 11/18/2024 1139 Gross per 24 hour  Intake 1674 ml  Output 425 ml  Net 1249 ml   Wt Readings from Last 3 Encounters:  11/07/24 92.6 kg  10/20/24 92.6 kg  09/22/24 96.6 kg    Examination:  Constitutional: NAD Eyes: lids and conjunctivae normal, no scleral icterus ENMT: mmm Neck: normal, supple Respiratory: clear to auscultation bilaterally, no wheezing, no crackles. Normal respiratory effort.  Cardiovascular: Regular rate and rhythm, no murmurs / rubs / gallops. No LE edema. Abdomen: soft, no distention, no tenderness. Bowel sounds positive.   Data Reviewed: I have independently reviewed following labs and imaging studies   CBC Recent Labs  Lab 11/12/24 0700 11/13/24 0640 11/14/24 0654 11/17/24 0716  WBC 13.7* 15.6* 15.6* 10.8*  HGB 7.9* 8.6* 8.9* 8.7*  HCT 26.0* 27.8* 28.6* 27.5*  PLT 17* 21* 22* 31*  MCV 90.6 90.6 89.9 88.7  MCH 27.5 28.0 28.0 28.1  MCHC 30.4 30.9 31.1 31.6  RDW 15.7* 15.8* 15.7* 15.7*    Recent Labs   Lab 11/12/24 0700 11/14/24 0654 11/17/24 0716  NA 140 136 134*  K 4.5 4.4 4.6  CL 100 98 100  CO2 32 29 26  GLUCOSE 158* 154* 145*  BUN 18 18 20   CREATININE 0.45 0.44 0.43*  CALCIUM  9.1 8.3* 8.5*  AST 37  --  32  ALT 9  --  26  ALKPHOS 143*  --  110  BILITOT 0.3  --  0.4  ALBUMIN 3.2*  --  3.1*    ------------------------------------------------------------------------------------------------------------------ No results for input(s): CHOL, HDL, LDLCALC, TRIG, CHOLHDL, LDLDIRECT in the last 72 hours.  Lab Results  Component Value Date   HGBA1C 6.6 (H) 08/04/2024   ------------------------------------------------------------------------------------------------------------------ No results for input(s): TSH, T4TOTAL, T3FREE, THYROIDAB in the last 72 hours.  Invalid input(s): FREET3  Cardiac Enzymes No results for input(s): CKMB, TROPONINI, MYOGLOBIN in the last 168 hours.  Invalid input(s): CK ------------------------------------------------------------------------------------------------------------------    Component Value Date/Time   BNP 50.0 11/01/2016  0535    CBG: No results for input(s): GLUCAP in the last 168 hours.  No results found for this or any previous visit (from the past 240 hours).    Radiology Studies: No results found.   Nilda Fendt, MD, PhD [facility]  Between 7 am - 7 pm I am available, please contact me via Amion (for emergencies) or Securechat (non urgent messages)  Between 7 pm - 7 am I am not available, please contact night coverage MD/APP via Amion  "

## 2024-11-18 NOTE — Progress Notes (Addendum)
 OT Cancellation Note  Patient Details Name: Carla Hanson MRN: [id number] DOB: 01-May-1966   Cancelled Treatment:    Reason Eval/Treat Not Completed: Fatigue/lethargy limiting ability to participate  Patient at radiation earlier day when checked on and now asleep, declined session,  requesting tomorrow as she is too fatigues post radiation. Will attempt tomorrow am prior to radiation.   Joselyne Spake OT/L Acute Rehabilitation Department  [phone]    11/18/2024, 3:11 PM

## 2024-11-18 NOTE — TOC Progression Note (Signed)
 Transition of Care [facility]) - Progression Note    Patient Details  Name: Carla Hanson MRN: [id number] Date of Birth: 11/16/1965  [facility]) CM/SW Contact  Toy LITTIE Agar, RN Phone [phone]  11/18/2024, 4:49 PM  Clinical Narrative:    No bed offers. Will expand search.    Expected Discharge Plan: Home w Home Health Services Barriers to Discharge: Continued Medical Work up               Expected Discharge Plan and Services In-house Referral: NA Discharge Planning Services: CM Consult Post Acute Care Choice:  (patient states that she has HH PT /OT/RN/ (BSC , shower chair, rollator, walker, wheelchair)) Living arrangements for the past 2 months: Single Family Home                 DME Arranged: N/A DME Agency: NA       HH Arranged: NA HH Agency: NA         Social Drivers of Health (SDOH) Interventions SDOH Screenings   Food Insecurity: No Food Insecurity (11/07/2024)  Housing: Low Risk (11/07/2024)  Transportation Needs: No Transportation Needs (11/07/2024)  Utilities: Not At Risk (11/07/2024)  Depression (PHQ2-9): Low Risk (09/08/2024)  Financial Resource Strain: Low Risk (05/14/2024)  Physical Activity: Insufficiently Active (05/14/2024)  Social Connections: Moderately Integrated (10/20/2024)  Stress: No Stress Concern Present (05/14/2024)  Tobacco Use: Medium Risk (11/08/2024)    Readmission Risk Interventions    11/09/2024    3:02 PM 10/26/2024    2:05 PM 02/03/2024    1:14 PM  Readmission Risk Prevention Plan  Transportation Screening Complete Complete Complete  PCP or Specialist Appt within 3-5 Days Complete Complete Complete  HRI or Home Care Consult Complete Complete   Social Work Consult for Recovery Care Planning/Counseling Complete Complete Complete  Palliative Care Screening Not Applicable  Not Applicable  Medication Review Oceanographer) Referral to Pharmacy Complete Complete

## 2024-11-19 ENCOUNTER — Other Ambulatory Visit: Payer: Self-pay

## 2024-11-19 ENCOUNTER — Ambulatory Visit

## 2024-11-19 DIAGNOSIS — J9601 Acute respiratory failure with hypoxia: Secondary | ICD-10-CM | POA: Diagnosis not present

## 2024-11-19 DIAGNOSIS — C9000 Multiple myeloma not having achieved remission: Secondary | ICD-10-CM | POA: Diagnosis not present

## 2024-11-19 DIAGNOSIS — S22050A Wedge compression fracture of T5-T6 vertebra, initial encounter for closed fracture: Secondary | ICD-10-CM | POA: Diagnosis not present

## 2024-11-19 LAB — RAD ONC ARIA SESSION SUMMARY
Course Elapsed Days: 9
Plan Fractions Treated to Date: 8
Plan Prescribed Dose Per Fraction: 2.5 Gy
Plan Total Fractions Prescribed: 10
Plan Total Prescribed Dose: 25 Gy
Reference Point Dosage Given to Date: 20 Gy
Reference Point Session Dosage Given: 2.5 Gy
Session Number: 8

## 2024-11-19 NOTE — Plan of Care (Signed)

## 2024-11-19 NOTE — TOC Progression Note (Addendum)
 Transition of Care [facility]) - Progression Note    Patient Details  Name: Carla Hanson MRN: [id number] Date of Birth: Jun 13, 1966  [facility]) CM/SW Contact  Toy LITTIE Agar, RN Phone [phone]  11/19/2024, 1:46 PM  Clinical Narrative:    Patient has 1 bed offer. CM at bedside to update patient. Patient states that [facility] will not work for her due to facility being in [place]. Patient has given CM permission to follow up with [facility] to determine if they can make a bed offer. CM to follow up with [facility]. Message has been sent via hub in reference to clinical review and consideration for admit.  Destination  Service Provider Request Status Services Address Phone Fax Patient Preferred  [facility] SN[facility]  Accepted -- [address], [place] [address] [phone] --  [facility]  Considering Need clinical review -- [address] [place] [address] [phone] --  [facility] SN[facility]  Considering -- [address] [place] [address] [phone] --  [facility] [place] Preferred SNF  Pending - Request Sent -- [address], [place] [address] [phone] --  [facility] Preferred SNF  Pending - Request Sent -- [address], [place] [address] [phone] --  [facility] [place] Preferred SNF  Pending - Request Sent -- [address] [place] [address] [phone] --  [facility] [place] Preferred SNF  Pending - Request Sent -- [address], [place] [address] [phone] --  [facility] SNF  Pending - Request Sent -- [address] [place], [place] [address] [phone] --  [facility] Preferred SNF   Pending - Request Sent -- [address] [place] [address] [phone] --  [name], [facility] Preferred SNF  Declined No medicaid beds -- [address], [place] [address] [phone] --  [facility]  Declined Cannot meet patient's needs -- [address] [place] [address] [phone] --  [facility] Preferred SNF  Declined Out of network -- [address] [place] [address] [phone] --  [facility]  Declined Insurance not in network -- [address], [place] [address] [phone] --  [facility]  [employer] not in network -- [address], [place] [address] [phone] --  [facility] SN[facility]  Declined -- [address] [place] [address] [phone] --  [facility] Preferred SN[facility]  Declined No bed availablity -- [address] [place] [address] [phone] --  [facility] Preferred SNF  Declined No bed availablity -- [address] [place] [address] [phone] --  [facility] PREFERRED SNF/ALF  Declined Facility full -- [address] [place] [address] [phone] --  [facility] SN[facility]/ALF  Declined Facility full -- [address], [place] [address] [phone] --  [facility] Preferred SNF  Declined Insurance not in network -- [address] [place] [address] [phone] --  [facility] AND REHABILITATION [facility] Preferred SNF  Declined -- [address] [place] [address] [phone] --  [facility] SNF  Declined Out of network -- [address] [place] [address] [phone] --  [facility] Preferred SNF  Declined -- [address], [place] [address] [phone] --  [facility] Preferred SNF  Declined No bed availablity -- [address], [place] [address] [phone] --  [facility] Preferred SNF  Declined -- [address], [place] [address] [phone] --  [facility] SNF  Declined Cannot meet patient's needs -- [address], [place] [address] [phone] --  [facility] Preferred SNF  Declined -- [address], [place] [address] [phone] --  [facility] PREFERRED SNF  Declined No bed availablity -- [address], [place] [address] [phone] --  [facility] Preferred SNF  Declined -- [address], [place] [address] [phone] --     Expected Discharge Plan: Home w Home Health Services Barriers to Discharge: Continued Medical Work up               Expected Discharge Plan and Services In-house Referral: NA Discharge Planning Services: CM Consult Post Acute Care Choice:  (patient states that she has HH PT /OT/RN/ (BSC , shower chair, rollator, walker, wheelchair)) Living arrangements for the past 2 months: Single Family Home                 DME Arranged: N/A DME Agency: NA       HH Arranged: NA HH Agency: NA         Social Drivers of Health (SDOH) Interventions SDOH Screenings   Food Insecurity: No Food Insecurity (11/07/2024)  Housing: Low Risk (11/07/2024)  Transportation Needs: No Transportation Needs (11/07/2024)  Utilities: Not At Risk (11/07/2024)  Depression (PHQ2-9): Low Risk (09/08/2024)  Financial Resource Strain: Low Risk (05/14/2024)  Physical Activity: Insufficiently Active (05/14/2024)  Social Connections: Moderately Integrated (10/20/2024)  Stress: No Stress Concern Present (05/14/2024)   Tobacco Use: Medium Risk (11/08/2024)    Readmission Risk Interventions    11/09/2024    3:02 PM 10/26/2024    2:05 PM 02/03/2024    1:14 PM  Readmission Risk Prevention Plan  Transportation Screening Complete Complete Complete  PCP or Specialist Appt within 3-5 Days Complete Complete Complete  HRI or Home Care Consult Complete Complete   Social Work Consult for Recovery Care Planning/Counseling Complete Complete Complete  Palliative Care Screening Not Applicable  Not Applicable  Medication Review Oceanographer) Referral to Pharmacy Complete Complete

## 2024-11-19 NOTE — Progress Notes (Signed)
 " PROGRESS NOTE  Carla Hanson FMW:[id number] DOB: 09-May-1966 DOA: 11/07/2024 PCP: Anders Otto DASEN, MD   LOS: 12 days   Brief Narrative / Interim history: 59 year old female history of obesity, HTN, COPD, prior DVT and SAH no longer anticoagulation, relapsed refractory multiple myeloma who comes into the hospital with shortness of breath, found to have pleural effusion, also found to have worsening T6 compression fracture.  Oncology and radiation oncology consulted, currently undergoing XRT  Subjective / 24h Interval events: Overall better, awaiting radiation therapy  Assesement and Plan: Principal problem Compression fracture of the T6 -on recent imaging this appears worse, initially diagnosed in 2025.  There is concern for progression of spinal cord impingement.  She has no extremity weakness, bowel or bladder dysfunction.  Radiation oncology consulted, she is getting XRT, about to go to radiation today.  She has been placed on Decadron , per radiation oncology  Active problems Acute respiratory failure hypoxia, possible pneumonia-status post 5 days of Rocephin  and doxycycline  as well as diuretics.  Stable, no respiratory symptoms, on room air  Thrombocytopenia-platelet transfusion for less than 10K or less than 50K with bleeding.  This is likely in the setting of underlying malignancy.  Repeat CBC tomorrow morning  Anemia-no bleeding, hemoglobin overall stable, repeat CBC in the morning  PA flutter-continue amiodarone , diltiazem .  Not on anticoagulation due to thrombocytopenia and history of SAH  Pressure injury of skin-stage II, left buttock, POA  Acute on chronic diastolic CHF-status post IV Lasix , currently appears euvolemic.  Multiple myeloma-seeing hematology as an outpatient.  Hypercalcemia of malignancy-status post 7 days of calcitonin  Hypokalemia-continue to supplement potassium and monitor  COPD-stable, no wheezing  History of PE-not on anticoagulation for reasons  above  Essential hypertension-continue diltiazem , blood pressure overall stable  Obesity, morbid-BMI over 40.  She would benefit from weight loss  Scheduled Meds:  acyclovir   400 mg Oral BID   amiodarone   200 mg Oral BID   dexamethasone   4 mg Oral Q12H   diltiazem   240 mg Oral Daily   ferrous sulfate   325 mg Oral Q breakfast   fluticasone  furoate-vilanterol  1 puff Inhalation Daily   folic acid   1 mg Oral BID   polyethylene glycol  17 g Oral Daily   potassium chloride   20 mEq Oral Daily   thiamine   100 mg Oral Daily   Continuous Infusions: PRN Meds:.acetaminophen  **OR** acetaminophen , albuterol , alum & mag hydroxide-simeth, bisacodyl , HYDROmorphone  (DILAUDID ) injection, mouth rinse, oxyCODONE   Current Outpatient Medications  Medication Instructions   acyclovir  (ZOVIRAX ) 400 mg, Oral, 2 times daily   albuterol  (PROVENTIL ) 2.5 mg, Nebulization, Every 6 hours PRN   albuterol  (VENTOLIN  HFA) 108 (90 Base) MCG/ACT inhaler 2 puffs, Inhalation, Every 6 hours PRN   amiodarone  (PACERONE ) 200 mg, Oral, 2 times daily   calcitonin, salmon, (MIACALCIN /FORTICAL) 200 UNIT/ACT nasal spray 1 spray, Alternating Nares, Daily   cetirizine  (ZYRTEC ) 10 mg, Oral, Daily   diltiazem  (CARDIZEM  CD) 240 mg, Oral, Daily   ferrous sulfate  325 mg, Oral, Daily with breakfast   folic acid  (FOLVITE ) 1 mg, Oral, 2 times daily   potassium chloride  SA (KLOR-CON  M) 20 MEQ tablet 20 mEq, Oral, 2 times daily   prochlorperazine  (COMPAZINE ) 10 MG tablet Take 1 tablet (10 mg total) by mouth every 6 (six) hours as needed for nausea or vomiting   SYMBICORT  160-4.5 MCG/ACT inhaler Inhalation   SYSTANE ULTRA 0.4-0.3 % SOLN 1 drop, Both Eyes, 3 times daily PRN   thiamine  (VITAMIN B1) 100 mg,  Oral, Daily   tiZANidine  (ZANAFLEX ) 4 mg, Oral, 2 times daily PRN   TYLENOL  500-1,000 mg, Oral, Every 6 hours PRN    Diet Orders (From admission, onward)     Start     Ordered   11/07/24 2056  Diet Heart Room service appropriate?  Yes; Fluid consistency: Thin; Fluid restriction: 1500 mL Fluid  Diet effective now       Question Answer Comment  Room service appropriate? Yes   Fluid consistency: Thin   Fluid restriction: 1500 mL Fluid      11/07/24 2056            DVT prophylaxis: SCDs Start: 11/07/24 2056   Lab Results  Component Value Date   PLT 31 (L) 11/17/2024      Code Status: Full Code  Family Communication: Patient's mother present at bedside  Status is: Inpatient Remains inpatient appropriate because: Radiation therapy   Level of care: Med-Surg  Consultants:  Radiation oncology Palliative care  Objective: Vitals:   11/18/24 1430 11/18/24 2119 11/19/24 0501 11/19/24 0841  BP: 133/80 (!) 144/80 (!) 142/72   Pulse: 68 72 66   Resp:  14 14   Temp: (!) 97.4 F (36.3 C) 98.2 F (36.8 C) 98.7 F (37.1 C)   TempSrc: Oral Oral Oral   SpO2: 100% 95% 99% 99%  Weight:      Height:        Intake/Output Summary (Last 24 hours) at 11/19/2024 1106 Last data filed at 11/19/2024 9491 Gross per 24 hour  Intake 1193 ml  Output 725 ml  Net 468 ml   Wt Readings from Last 3 Encounters:  11/07/24 92.6 kg  10/20/24 92.6 kg  09/22/24 96.6 kg    Examination:  Constitutional: NAD Eyes: lids and conjunctivae normal, no scleral icterus ENMT: mmm Neck: normal, supple Respiratory: clear to auscultation bilaterally, no wheezing, no crackles. Normal respiratory effort.  Cardiovascular: Regular rate and rhythm, no murmurs / rubs / gallops. No LE edema. Abdomen: soft, no distention, no tenderness. Bowel sounds positive.   Data Reviewed: I have independently reviewed following labs and imaging studies   CBC Recent Labs  Lab 11/13/24 0640 11/14/24 0654 11/17/24 0716  WBC 15.6* 15.6* 10.8*  HGB 8.6* 8.9* 8.7*  HCT 27.8* 28.6* 27.5*  PLT 21* 22* 31*  MCV 90.6 89.9 88.7  MCH 28.0 28.0 28.1  MCHC 30.9 31.1 31.6  RDW 15.8* 15.7* 15.7*    Recent Labs  Lab 11/14/24 0654 11/17/24 0716  NA  136 134*  K 4.4 4.6  CL 98 100  CO2 29 26  GLUCOSE 154* 145*  BUN 18 20  CREATININE 0.44 0.43*  CALCIUM  8.3* 8.5*  AST  --  32  ALT  --  26  ALKPHOS  --  110  BILITOT  --  0.4  ALBUMIN  --  3.1*    ------------------------------------------------------------------------------------------------------------------ No results for input(s): CHOL, HDL, LDLCALC, TRIG, CHOLHDL, LDLDIRECT in the last 72 hours.  Lab Results  Component Value Date   HGBA1C 6.6 (H) 08/04/2024   ------------------------------------------------------------------------------------------------------------------ No results for input(s): TSH, T4TOTAL, T3FREE, THYROIDAB in the last 72 hours.  Invalid input(s): FREET3  Cardiac Enzymes No results for input(s): CKMB, TROPONINI, MYOGLOBIN in the last 168 hours.  Invalid input(s): CK ------------------------------------------------------------------------------------------------------------------    Component Value Date/Time   BNP 50.0 11/01/2016 0535    CBG: No results for input(s): GLUCAP in the last 168 hours.  No results found for this or any previous visit (from  the past 240 hours).    Radiology Studies: No results found.   Nilda Fendt, MD, PhD [facility]  Between 7 am - 7 pm I am available, please contact me via Amion (for emergencies) or Securechat (non urgent messages)  Between 7 pm - 7 am I am not available, please contact night coverage MD/APP via Amion  "

## 2024-11-19 NOTE — Plan of Care (Signed)
  Problem: Nutrition: Goal: Adequate nutrition will be maintained Outcome: Progressing   Problem: Coping: Goal: Level of anxiety will decrease Outcome: Progressing   Problem: Pain Managment: Goal: General experience of comfort will improve and/or be controlled Outcome: Progressing

## 2024-11-19 NOTE — Progress Notes (Signed)
 Occupational Therapy Treatment Patient Details Name: Carla Hanson MRN: [id number] DOB: [birth year] Today's Date: 11/19/2024   History of present illness Pt admitted with acute respiratory failure due to bilateral pleural effusions, T6 compression fx with mild cord compression in the setting of refractory multiple myeloma. Neurosurgery does not recommend surgical intervention. 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 PMHx includes HTN, HLD, LLE DVT, CVA w/o residual deficits, AFIB, bronchitis, PNA, COPD, PE, cystectomy, multiple myeloma (currently under tx), OA, anxiety, bipolar   OT comments  Patient seen for skilled OT session this am. Patient motivated to progress and open to ADL's EOB, STS trials for hygiene and LB dressing, B UE therex and activity to progress overall strength and activity tolerance. Requested to remain bed level due to feeling shaky (VSS) and exhibited some lability and overwhelment re: her condition with OT providing comfort, encouragement and listening support. See below for all current status. Encouraged UE HEP with L1 theraband between visits and patient able to teach back. Patient will benefit from continued inpatient follow up therapy, <3 hours/day. Patient requires continued Acute care hospital level OT services to progress safety and functional performance and allow for discharge.        If plan is discharge home, recommend the following:  Two people to help with walking and/or transfers;A lot of help with bathing/dressing/bathroom;Help with stairs or ramp for entrance;Assist for transportation   Equipment Recommendations  [place] lift;Hospital bed       Precautions / Restrictions Precautions Precautions: Fall;Back;Other (comment) Recall of Precautions/Restrictions: Impaired Precaution/Restrictions Comments: spinal precautions for comfort, may use TLSO for comfort per MD (brace not  ordered, pt does not experience back pain throughout eval) Required Braces or Orthoses: Other Brace Restrictions Weight Bearing Restrictions Per Provider Order: No       Mobility Bed Mobility Overal bed mobility: Needs Assistance Bed Mobility: Supine to Sit, Sit to Supine     Supine to sit: Contact guard, HOB elevated Sit to supine: HOB elevated, Used rails, Mod assist, Min assist   General bed mobility comments: educated on use of gait belt and bed features    Transfers Overall transfer level: Needs assistance Equipment used: 2 person hand held assist (deferred use of RW due to patient fearful despite OT education and STEDY offered as well and deferred by patient) Transfers: Sit to/from Stand             General transfer comment: STS x 5 x 2 sets with +2 with min buttocks clearance only, declined up to recliner with STEDY or lateral scoot due to feeling shaky today     Balance Overall balance assessment: Needs assistance Sitting-balance support: Feet supported Sitting balance-Leahy Scale: Good     Standing balance support: Bilateral upper extremity supported, During functional activity, Reliant on assistive device for balance Standing balance-Leahy Scale: Poor Standing balance comment: does best with B arm hold and knee/foot blocking due to patient fear                           ADL either performed or assessed with clinical judgement   ADL Overall ADL's : Needs assistance/impaired Eating/Feeding: Set up;Sitting   Grooming: Wash/dry hands;Wash/dry face;Oral care;Applying deodorant;Sitting;Set up   Upper Body Bathing: Sitting;Set up   Lower Body Bathing: Sitting/lateral leans;Bed level;Moderate assistance;With adaptive equipment   Upper Body Dressing : Set up;Sitting   Lower Body Dressing: Minimal assistance;Moderate assistance;Sitting/lateral leans;Bed  level       Toileting- Clothing Manipulation and Hygiene: Maximal assistance;Sitting/lateral  lean       Functional mobility during ADLs: +2 for physical assistance;+2 for safety/equipment;Moderate assistance (STS this visity only due to fear and overhelment re condition) General ADL Comments: requires support and listening skills with some tearfulness but good effort put forth overall, needs frequent rests but no pain    Extremity/Trunk Assessment Upper Extremity Assessment Upper Extremity Assessment: Generalized weakness;Right hand dominant   Lower Extremity Assessment Lower Extremity Assessment: Defer to PT evaluation                 Communication Communication Communication: No apparent difficulties   Cognition Arousal: Alert Behavior During Therapy: Anxious, Lability Cognition: No apparent impairments             OT - Cognition Comments: continues to be anxious about mobility attempts, fearful of falling, reassured and supported                 Following commands: Intact        Cueing   Cueing Techniques: Verbal cues, Tactile cues  Exercises Exercises: Other exercises (balloon toss 20 reps x 5 sets EOB Bly, yellow L 1 tband for tricep press Bly 10 reps rec 3x per day)       General Comments cues for pacing, fatuges easily, c/o shakiness but BP 148/76 and HR 68 SpO2 98%    Pertinent Vitals/ Pain       Pain Assessment Pain Assessment: Faces Faces Pain Scale: No hurt Pain Intervention(s): Monitored during session, Premedicated before session, Repositioned   Frequency  Min 2X/week        Progress Toward Goals  OT Goals(current goals can now be found in the care plan section)  Progress towards OT goals: Progressing toward goals  Acute Rehab OT Goals Patient Stated Goal: to get my body strong OT Goal Formulation: With patient Time For Goal Achievement: 11/24/24 Potential to Achieve Goals: Fair ADL Goals Pt Will Perform Lower Body Bathing: with mod assist;sit to/from stand Pt Will Perform Upper Body Dressing: sitting;with  supervision Pt Will Perform Lower Body Dressing: sitting/lateral leans;sit to/from stand;with adaptive equipment;with mod assist Pt Will Transfer to Toilet: with mod assist;stand pivot transfer;bedside commode Pt Will Perform Toileting - Clothing Manipulation and hygiene: with mod assist;sit to/from stand;sitting/lateral leans Pt/caregiver will Perform Home Exercise Program: Increased strength;Both right and left upper extremity;With theraband;Independently;With written HEP provided Additional ADL Goal #1: Pt will return demo bed mobility using log roll technique with MIN A to decrease caregiver burden for ADL performance at bed level  Plan         AM-PAC OT 6 Clicks Daily Activity     Outcome Measure   Help from another person eating meals?: None Help from another person taking care of personal grooming?: A Little Help from another person toileting, which includes using toliet, bedpan, or urinal?: A Lot Help from another person bathing (including washing, rinsing, drying)?: A Lot Help from another person to put on and taking off regular upper body clothing?: A Little Help from another person to put on and taking off regular lower body clothing?: A Lot 6 Click Score: 16    End of Session Equipment Utilized During Treatment: Gait belt  OT Visit Diagnosis: Other abnormalities of gait and mobility (R26.89);Muscle weakness (generalized) (M62.81);Unsteadiness on feet (R26.81)   Activity Tolerance Patient limited by fatigue   Patient Left in bed;with call bell/phone within reach;with bed alarm set  Nurse Communication Mobility status        Time: 9081-8989 OT Time Calculation (min): 52 min  Charges: OT General Charges $OT Visit: 1 Visit OT Treatments $Self Care/Home Management : 8-22 mins $Therapeutic Activity: 8-22 mins $Therapeutic Exercise: 8-22 mins  Kamarri Fischetti OT/L Acute Rehabilitation Department  [phone]  11/19/2024, 10:22 AM

## 2024-11-19 NOTE — Progress Notes (Signed)
 PT Cancellation Note  Patient Details Name: Carla Hanson MRN: [id number] DOB: 01/14/1966   Cancelled Treatment:     Pt declined any activity.  I just don't want to and I don't care.  I asked if she needed anything, and she responded, yeah, something for depression.    Katheryn Leap  PTA Acute  Rehabilitation Services Office M-F          [phone]

## 2024-11-20 DIAGNOSIS — C9000 Multiple myeloma not having achieved remission: Secondary | ICD-10-CM | POA: Diagnosis not present

## 2024-11-20 DIAGNOSIS — J9601 Acute respiratory failure with hypoxia: Secondary | ICD-10-CM | POA: Diagnosis not present

## 2024-11-20 DIAGNOSIS — S22050A Wedge compression fracture of T5-T6 vertebra, initial encounter for closed fracture: Secondary | ICD-10-CM | POA: Diagnosis not present

## 2024-11-20 LAB — CBC
HCT: 27.6 % — ABNORMAL LOW (ref 36.0–46.0)
Hemoglobin: 9 g/dL — ABNORMAL LOW (ref 12.0–15.0)
MCH: 28.7 pg (ref 26.0–34.0)
MCHC: 32.6 g/dL (ref 30.0–36.0)
MCV: 87.9 fL (ref 80.0–100.0)
Platelets: 42 K/uL — ABNORMAL LOW (ref 150–400)
RBC: 3.14 MIL/uL — ABNORMAL LOW (ref 3.87–5.11)
RDW: 16 % — ABNORMAL HIGH (ref 11.5–15.5)
WBC: 9.2 K/uL (ref 4.0–10.5)
nRBC: 20.1 % — ABNORMAL HIGH (ref 0.0–0.2)

## 2024-11-20 LAB — COMPREHENSIVE METABOLIC PANEL WITH GFR
ALT: 36 U/L (ref 0–44)
AST: 31 U/L (ref 15–41)
Albumin: 3.4 g/dL — ABNORMAL LOW (ref 3.5–5.0)
Alkaline Phosphatase: 118 U/L (ref 38–126)
Anion gap: 10 (ref 5–15)
BUN: 18 mg/dL (ref 6–20)
CO2: 24 mmol/L (ref 22–32)
Calcium: 8.2 mg/dL — ABNORMAL LOW (ref 8.9–10.3)
Chloride: 102 mmol/L (ref 98–111)
Creatinine, Ser: 0.56 mg/dL (ref 0.44–1.00)
GFR, Estimated: >60 mL/min
Glucose, Bld: 162 mg/dL — ABNORMAL HIGH (ref 70–99)
Potassium: 4.3 mmol/L (ref 3.5–5.1)
Sodium: 136 mmol/L (ref 135–145)
Total Bilirubin: 0.4 mg/dL (ref 0.0–1.2)
Total Protein: 6.1 g/dL — ABNORMAL LOW (ref 6.5–8.1)

## 2024-11-20 LAB — MAGNESIUM: Magnesium: 1.9 mg/dL (ref 1.7–2.4)

## 2024-11-20 MED ORDER — DEXAMETHASONE 4 MG PO TABS
4.0000 mg | ORAL_TABLET | Freq: Every day | ORAL | Status: DC
  Administered 2024-11-21 – 2024-11-25 (×5): 4 mg via ORAL
  Filled 2024-11-20 (×5): qty 1

## 2024-11-20 MED ORDER — LORATADINE 10 MG PO TABS
10.0000 mg | ORAL_TABLET | Freq: Every day | ORAL | Status: DC
  Administered 2024-11-20 – 2024-11-25 (×6): 10 mg via ORAL
  Filled 2024-11-20 (×6): qty 1

## 2024-11-20 NOTE — Plan of Care (Signed)

## 2024-11-20 NOTE — Progress Notes (Signed)
 " PROGRESS NOTE  Carla Hanson FMW:[id number] DOB: [birth year] DOA: 11/07/2024 PCP: Anders Otto DASEN, MD   LOS: 13 days   Brief Narrative / Interim history: 59 year old female history of obesity, HTN, COPD, prior DVT and SAH no longer anticoagulation, relapsed refractory multiple myeloma who comes into the hospital with shortness of breath, found to have pleural effusion, also found to have worsening T6 compression fracture.  Oncology and radiation oncology consulted, currently undergoing XRT  Subjective / 24h Interval events: No complaints today  Assesement and Plan: Principal problem Compression fracture of the T6 -on recent imaging this appears worse, initially diagnosed in 2025.  There is concern for progression of spinal cord impingement.  She has no extremity weakness, bowel or bladder dysfunction.  Radiation oncology consulted, she is getting XRT, about to go to radiation today.  She has been placed on Decadron , start tapering  Active problems Acute respiratory failure hypoxia, possible pneumonia-status post 5 days of Rocephin  and doxycycline  as well as diuretics.  Stable, no respiratory symptoms, remains on room air  Thrombocytopenia-platelet transfusion for less than 10K or less than 50K with bleeding.  Platelets overall stable, 42K this morning  Anemia-no bleeding, hemoglobin overall stable, 9.0 today  PA flutter-continue amiodarone , diltiazem .  Not on anticoagulation due to thrombocytopenia and history of SAH  Pressure injury of skin-stage II, left buttock, POA  Acute on chronic diastolic CHF-status post IV Lasix , currently appears euvolemic.  Multiple myeloma-seeing hematology as an outpatient.  Hypercalcemia of malignancy-status post 7 days of calcitonin  Hypokalemia-continue to supplement potassium and monitor  COPD-stable, no wheezing  History of PE-not on anticoagulation for reasons above  Essential hypertension-continue diltiazem , blood pressure is  stable  Obesity, morbid-BMI over 40.  She would benefit from weight loss  Scheduled Meds:  acyclovir   400 mg Oral BID   amiodarone   200 mg Oral BID   dexamethasone   4 mg Oral Q12H   diltiazem   240 mg Oral Daily   ferrous sulfate   325 mg Oral Q breakfast   fluticasone  furoate-vilanterol  1 puff Inhalation Daily   folic acid   1 mg Oral BID   loratadine   10 mg Oral Daily   polyethylene glycol  17 g Oral Daily   potassium chloride   20 mEq Oral Daily   thiamine   100 mg Oral Daily   Continuous Infusions: PRN Meds:.acetaminophen  **OR** acetaminophen , albuterol , alum & mag hydroxide-simeth, bisacodyl , HYDROmorphone  (DILAUDID ) injection, mouth rinse, oxyCODONE   Current Outpatient Medications  Medication Instructions   acyclovir  (ZOVIRAX ) 400 mg, Oral, 2 times daily   albuterol  (PROVENTIL ) 2.5 mg, Nebulization, Every 6 hours PRN   albuterol  (VENTOLIN  HFA) 108 (90 Base) MCG/ACT inhaler 2 puffs, Inhalation, Every 6 hours PRN   amiodarone  (PACERONE ) 200 mg, Oral, 2 times daily   calcitonin, salmon, (MIACALCIN /FORTICAL) 200 UNIT/ACT nasal spray 1 spray, Alternating Nares, Daily   cetirizine  (ZYRTEC ) 10 mg, Oral, Daily   diltiazem  (CARDIZEM  CD) 240 mg, Oral, Daily   ferrous sulfate  325 mg, Oral, Daily with breakfast   folic acid  (FOLVITE ) 1 mg, Oral, 2 times daily   potassium chloride  SA (KLOR-CON  M) 20 MEQ tablet 20 mEq, Oral, 2 times daily   prochlorperazine  (COMPAZINE ) 10 MG tablet Take 1 tablet (10 mg total) by mouth every 6 (six) hours as needed for nausea or vomiting   SYMBICORT  160-4.5 MCG/ACT inhaler Inhalation   SYSTANE ULTRA 0.4-0.3 % SOLN 1 drop, Both Eyes, 3 times daily PRN   thiamine  (VITAMIN B1) 100 mg, Oral, Daily   tiZANidine  (  ZANAFLEX ) 4 mg, Oral, 2 times daily PRN   TYLENOL  500-1,000 mg, Oral, Every 6 hours PRN    Diet Orders (From admission, onward)     Start     Ordered   11/07/24 2056  Diet Heart Room service appropriate? Yes; Fluid consistency: Thin; Fluid  restriction: 1500 mL Fluid  Diet effective now       Question Answer Comment  Room service appropriate? Yes   Fluid consistency: Thin   Fluid restriction: 1500 mL Fluid      11/07/24 2056            DVT prophylaxis: SCDs Start: 11/07/24 2056   Lab Results  Component Value Date   PLT 42 (L) 11/20/2024      Code Status: Full Code  Family Communication: Patient's mother present at bedside  Status is: Inpatient Remains inpatient appropriate because: Radiation therapy   Level of care: Med-Surg  Consultants:  Radiation oncology Palliative care  Objective: Vitals:   11/19/24 0841 11/19/24 1309 11/19/24 2022 11/20/24 0542  BP:  (!) 143/80 127/81 135/79  Pulse:  70 69 64  Resp:   18 16  Temp:  98.5 F (36.9 C) 97.7 F (36.5 C) 97.9 F (36.6 C)  TempSrc:  Oral Oral Oral  SpO2: 99% 95% 97% 97%  Weight:      Height:        Intake/Output Summary (Last 24 hours) at 11/20/2024 1115 Last data filed at 11/20/2024 0636 Gross per 24 hour  Intake 240 ml  Output 300 ml  Net -60 ml   Wt Readings from Last 3 Encounters:  11/07/24 92.6 kg  10/20/24 92.6 kg  09/22/24 96.6 kg    Examination:  Constitutional: NAD Eyes: lids and conjunctivae normal, no scleral icterus ENMT: mmm Neck: normal, supple Respiratory: clear to auscultation bilaterally, no wheezing, no crackles. Normal respiratory effort.  Cardiovascular: Regular rate and rhythm, no murmurs / rubs / gallops. No LE edema. Abdomen: soft, no distention, no tenderness. Bowel sounds positive.   Data Reviewed: I have independently reviewed following labs and imaging studies   CBC Recent Labs  Lab 11/14/24 0654 11/17/24 0716 11/20/24 0613  WBC 15.6* 10.8* 9.2  HGB 8.9* 8.7* 9.0*  HCT 28.6* 27.5* 27.6*  PLT 22* 31* 42*  MCV 89.9 88.7 87.9  MCH 28.0 28.1 28.7  MCHC 31.1 31.6 32.6  RDW 15.7* 15.7* 16.0*    Recent Labs  Lab 11/14/24 0654 11/17/24 0716 11/20/24 0613  NA 136 134* 136  K 4.4 4.6 4.3   CL 98 100 102  CO2 29 26 24   GLUCOSE 154* 145* 162*  BUN 18 20 18   CREATININE 0.44 0.43* 0.56  CALCIUM  8.3* 8.5* 8.2*  AST  --  32 31  ALT  --  26 36  ALKPHOS  --  110 118  BILITOT  --  0.4 0.4  ALBUMIN  --  3.1* 3.4*  MG  --   --  1.9    ------------------------------------------------------------------------------------------------------------------ No results for input(s): CHOL, HDL, LDLCALC, TRIG, CHOLHDL, LDLDIRECT in the last 72 hours.  Lab Results  Component Value Date   HGBA1C 6.6 (H) 08/04/2024   ------------------------------------------------------------------------------------------------------------------ No results for input(s): TSH, T4TOTAL, T3FREE, THYROIDAB in the last 72 hours.  Invalid input(s): FREET3  Cardiac Enzymes No results for input(s): CKMB, TROPONINI, MYOGLOBIN in the last 168 hours.  Invalid input(s): CK ------------------------------------------------------------------------------------------------------------------    Component Value Date/Time   BNP 50.0 11/01/2016 0535    CBG: No results for input(s):  GLUCAP in the last 168 hours.  No results found for this or any previous visit (from the past 240 hours).    Radiology Studies: No results found.   Nilda Fendt, MD, PhD [facility]  Between 7 am - 7 pm I am available, please contact me via Amion (for emergencies) or Securechat (non urgent messages)  Between 7 pm - 7 am I am not available, please contact night coverage MD/APP via Amion  "

## 2024-11-21 DIAGNOSIS — J9601 Acute respiratory failure with hypoxia: Secondary | ICD-10-CM | POA: Diagnosis not present

## 2024-11-21 DIAGNOSIS — S22050A Wedge compression fracture of T5-T6 vertebra, initial encounter for closed fracture: Secondary | ICD-10-CM | POA: Diagnosis not present

## 2024-11-21 DIAGNOSIS — C9000 Multiple myeloma not having achieved remission: Secondary | ICD-10-CM | POA: Diagnosis not present

## 2024-11-21 DIAGNOSIS — Z79899 Other long term (current) drug therapy: Secondary | ICD-10-CM

## 2024-11-21 MED ORDER — HYDROMORPHONE HCL 1 MG/ML IJ SOLN
1.0000 mg | INTRAMUSCULAR | Status: DC | PRN
  Administered 2024-11-21 – 2024-11-24 (×6): 1 mg via INTRAVENOUS
  Filled 2024-11-21 (×6): qty 1

## 2024-11-21 MED ORDER — OXYCODONE HCL 5 MG PO TABS
5.0000 mg | ORAL_TABLET | ORAL | Status: DC | PRN
  Administered 2024-11-21 – 2024-11-25 (×12): 5 mg via ORAL
  Filled 2024-11-21 (×12): qty 1

## 2024-11-21 NOTE — Progress Notes (Signed)
 " Daily Progress Note   Patient Name: Carla Hanson       Date: 11/21/2024 DOB: Aug 06, 1966  Age: 59 y.o. MRN#: [id number] Attending Physician: Trixie Nilda HERO, MD Primary Care Physician: Anders Otto DASEN, MD Admit Date: 11/07/2024 Length of Stay: 14 days  Reason for Follow-up: Establishing goals of care and symptom management  Subjective:   CC: Patient noted pain overall improving.  Following up regarding symptom management and goals of care.  Reviewed hospitalist documentation for today.  Also discussed care with hospitalist for medical updates.   Patient currently completing radiation for malignant compression fracture at T6; radiation to be completed on Tuesday, 1/20.  TOC assisting with discharge coordination and planning for rehab. At time of EMR reviewed past 24 hours patient has received as needed oral oxycodone  5 mg x 3 doses and as needed IV Dilaudid  1 mg x 1 dose.  Last BM noted to be on 11/21/2024. Reviewed CMP from today did note that patient's GFR estimated >60 to help with medication management and recommendations.  Presented to bedside to see patient.  Again introduced myself as a member of the palliative medicine team; have seen patient previously.  Able to follow-up on patient's symptom management at this time.  Patient feels the as needed oxycodone  at the 5 mg dose does work to help with management of her abdominal pain.  Discussed patient has still needed as needed IV Dilaudid  in the past 24 hours and explained that patient would not be able to receive IV medication once discharged from the hospital.  Patient acknowledges this and still noted she did not feel the as needed oxycodone  needed to be adjusted at this time.  Noted could inform care team if feels the oxycodone  should be further adjusted.  Also encouraged patient not using IV Dilaudid  since again she cannot receive it after discharge.  Patient still noting plan is for her to go to rehab which she is currently stating  she still wants to do.  Acknowledged this and spent time providing emotional support via active listening.  Noted palliative medicine team will be available if needed.  Discussed care with RN after visit to coordinate care as well.  Objective:   Vital Signs:  BP 133/75 (BP Location: Right Arm)   Pulse 65   Temp 97.7 F (36.5 C) (Oral)   Resp 16   Ht 5' 1.5 (1.562 m)   Wt 92.6 kg   LMP  (LMP Unknown)   SpO2 99%   BMI 37.95 kg/m   Physical Exam: General: NAD, alert, chronically ill-appearing Cardiovascular: RRR Respiratory: no increased work of breathing noted, not in respiratory distress Abdomen: not distended Neuro: A&Ox4, following commands easily  Assessment & Plan:   Assessment: Patient is a 59 year old female with a past medical history of obesity, hypertension, COPD, prior DVT and SAH no longer on anticoagulation, and relapsed refractory multiple myeloma who was admitted on 11/07/2024 for management of shortness of breath and was found to have a pleural effusion as well as worsening T6 compression fracture.  Oncology and radiation oncology consulted to provide recommendations.  Patient currently undergoing radiation.  Palliative medicine team consulted to assist with goals of care and symptom management.  Recommendations/Plan: # Complex medical decision making/goals of care:  - Per prior conversations patient has continued to express wishes for full code, full scope of care.  Patient hopeful to regain her strength to eventually follow-up at [facility] for further therapies for her life-limiting illness.  -  Code Status: Full Code  # Symptom management: Patient is receiving these palliative interventions for symptom management with an intent to improve quality of life.   - Pain, acute on chronic in setting of malignant T6 compression fracture and refractory multiple myeloma   - Continue oxycodone  5 mg every 4 hours as needed   - Continue IV Dilaudid  1 mg every 4 hours as  needed though explained/adjusted order as this is for breakthrough pain only after oral oxycodone  since patient will not be able to receive IV Dilaudid  once outside of the hospital  # Discharge Planning: [facility] assisting with discharge coordination.  Planning for rehab currently. - Recommend palliative medicine follow-up once patient discharged.  Discussed with: Hospitalist, RN, patient  Thank you for allowing the palliative care team to participate in the care [name].  Tinnie Radar, DO Palliative Care Provider PMT # [phone]  If patient remains symptomatic despite maximum doses, please call PMT at [phone] between 0700 and 1900. Outside of these hours, please call attending, as PMT does not have night coverage.  Personally spent 35 minutes in patient care including extensive chart review (labs, imaging, progress/consult notes, vital signs), medically appropraite exam, discussed with treatment team, education to patient, documenting clinical information, medication review and management, and coordination of care.  "

## 2024-11-21 NOTE — Plan of Care (Signed)

## 2024-11-21 NOTE — Progress Notes (Signed)
 " PROGRESS NOTE  Carla Hanson FMW:[id number] DOB: 14-Mar-1966 DOA: 11/07/2024 PCP: Anders Otto DASEN, MD   LOS: 14 days   Brief Narrative / Interim history: 59 year old female history of obesity, HTN, COPD, prior DVT and SAH no longer anticoagulation, relapsed refractory multiple myeloma who comes into the hospital with shortness of breath, found to have pleural effusion, also found to have worsening T6 compression fracture.  Oncology and radiation oncology consulted, currently undergoing XRT  Subjective / 24h Interval events: No complaints  Assesement and Plan: Principal problem Compression fracture of the T6 -on recent imaging this appears worse, initially diagnosed in 2025.  There is concern for progression of spinal cord impingement.  She has no extremity weakness, bowel or bladder dysfunction.  Radiation oncology consulted, she is getting XRT, about to go to radiation today.  She has been placed on Decadron , tapering off  Active problems Acute respiratory failure hypoxia, possible pneumonia-status post 5 days of Rocephin  and doxycycline  as well as diuretics.  Stable, no respiratory symptoms, remains on room air  Thrombocytopenia-platelet transfusion for less than 10K or less than 50K with bleeding.  Platelets overall stable, check in the morning  Anemia-no bleeding, hemoglobin overall stable, recheck in the morning  PA flutter-continue amiodarone , diltiazem .  Not on anticoagulation due to thrombocytopenia and history of SAH  Pressure injury of skin-stage II, left buttock, POA  Acute on chronic diastolic CHF-status post IV Lasix , currently euvolemic.  Multiple myeloma-seeing hematology as an outpatient.  Hypercalcemia of malignancy-status post 7 days of calcitonin.  Calcium  stable  Hypokalemia-continue to supplement potassium and monitor.  Most recently normalized  COPD-stable, no wheezing, on room air  History of PE-not on anticoagulation for reasons above  Essential  hypertension-continue diltiazem , blood pressure is stable  Obesity, morbid-BMI over 40.  She would benefit from weight loss  Scheduled Meds:  acyclovir   400 mg Oral BID   amiodarone   200 mg Oral BID   dexamethasone   4 mg Oral Daily   diltiazem   240 mg Oral Daily   ferrous sulfate   325 mg Oral Q breakfast   fluticasone  furoate-vilanterol  1 puff Inhalation Daily   folic acid   1 mg Oral BID   loratadine   10 mg Oral Daily   polyethylene glycol  17 g Oral Daily   potassium chloride   20 mEq Oral Daily   thiamine   100 mg Oral Daily   Continuous Infusions: PRN Meds:.acetaminophen  **OR** acetaminophen , albuterol , alum & mag hydroxide-simeth, bisacodyl , HYDROmorphone  (DILAUDID ) injection, mouth rinse, oxyCODONE   Current Outpatient Medications  Medication Instructions   acyclovir  (ZOVIRAX ) 400 mg, Oral, 2 times daily   albuterol  (PROVENTIL ) 2.5 mg, Nebulization, Every 6 hours PRN   albuterol  (VENTOLIN  HFA) 108 (90 Base) MCG/ACT inhaler 2 puffs, Inhalation, Every 6 hours PRN   amiodarone  (PACERONE ) 200 mg, Oral, 2 times daily   calcitonin, salmon, (MIACALCIN /FORTICAL) 200 UNIT/ACT nasal spray 1 spray, Alternating Nares, Daily   cetirizine  (ZYRTEC ) 10 mg, Oral, Daily   diltiazem  (CARDIZEM  CD) 240 mg, Oral, Daily   ferrous sulfate  325 mg, Oral, Daily with breakfast   folic acid  (FOLVITE ) 1 mg, Oral, 2 times daily   potassium chloride  SA (KLOR-CON  M) 20 MEQ tablet 20 mEq, Oral, 2 times daily   prochlorperazine  (COMPAZINE ) 10 MG tablet Take 1 tablet (10 mg total) by mouth every 6 (six) hours as needed for nausea or vomiting   SYMBICORT  160-4.5 MCG/ACT inhaler Inhalation   SYSTANE ULTRA 0.4-0.3 % SOLN 1 drop, Both Eyes, 3 times daily PRN  thiamine  (VITAMIN B1) 100 mg, Oral, Daily   tiZANidine  (ZANAFLEX ) 4 mg, Oral, 2 times daily PRN   TYLENOL  500-1,000 mg, Oral, Every 6 hours PRN    Diet Orders (From admission, onward)     Start     Ordered   11/07/24 2056  Diet Heart Room service  appropriate? Yes; Fluid consistency: Thin; Fluid restriction: 1500 mL Fluid  Diet effective now       Question Answer Comment  Room service appropriate? Yes   Fluid consistency: Thin   Fluid restriction: 1500 mL Fluid      11/07/24 2056            DVT prophylaxis: SCDs Start: 11/07/24 2056   Lab Results  Component Value Date   PLT 42 (L) 11/20/2024      Code Status: Full Code  Family Communication: Patient's mother present at bedside  Status is: Inpatient Remains inpatient appropriate because: Radiation therapy   Level of care: Med-Surg  Consultants:  Radiation oncology Palliative care  Objective: Vitals:   11/20/24 0542 11/20/24 1358 11/20/24 2032 11/21/24 0428  BP: 135/79 (!) 142/72 (!) 143/68 133/75  Pulse: 64 72 72 65  Resp: 16  18 16   Temp: 97.9 F (36.6 C) 98.4 F (36.9 C) 98.7 F (37.1 C) 97.7 F (36.5 C)  TempSrc: Oral Oral Oral Oral  SpO2: 97% 96% 100% 99%  Weight:      Height:        Intake/Output Summary (Last 24 hours) at 11/21/2024 1113 Last data filed at 11/21/2024 0055 Gross per 24 hour  Intake 355 ml  Output --  Net 355 ml   Wt Readings from Last 3 Encounters:  11/07/24 92.6 kg  10/20/24 92.6 kg  09/22/24 96.6 kg    Examination:  Constitutional: NAD Eyes: lids and conjunctivae normal, no scleral icterus ENMT: mmm Neck: normal, supple Respiratory: clear to auscultation bilaterally, no wheezing, no crackles. Normal respiratory effort.  Cardiovascular: Regular rate and rhythm, no murmurs / rubs / gallops. No LE edema. Abdomen: soft, no distention, no tenderness. Bowel sounds positive.   Data Reviewed: I have independently reviewed following labs and imaging studies   CBC Recent Labs  Lab 11/17/24 0716 11/20/24 0613  WBC 10.8* 9.2  HGB 8.7* 9.0*  HCT 27.5* 27.6*  PLT 31* 42*  MCV 88.7 87.9  MCH 28.1 28.7  MCHC 31.6 32.6  RDW 15.7* 16.0*    Recent Labs  Lab 11/17/24 0716 11/20/24 0613  NA 134* 136  K 4.6 4.3   CL 100 102  CO2 26 24  GLUCOSE 145* 162*  BUN 20 18  CREATININE 0.43* 0.56  CALCIUM  8.5* 8.2*  AST 32 31  ALT 26 36  ALKPHOS 110 118  BILITOT 0.4 0.4  ALBUMIN 3.1* 3.4*  MG  --  1.9    ------------------------------------------------------------------------------------------------------------------ No results for input(s): CHOL, HDL, LDLCALC, TRIG, CHOLHDL, LDLDIRECT in the last 72 hours.  Lab Results  Component Value Date   HGBA1C 6.6 (H) 08/04/2024   ------------------------------------------------------------------------------------------------------------------ No results for input(s): TSH, T4TOTAL, T3FREE, THYROIDAB in the last 72 hours.  Invalid input(s): FREET3  Cardiac Enzymes No results for input(s): CKMB, TROPONINI, MYOGLOBIN in the last 168 hours.  Invalid input(s): CK ------------------------------------------------------------------------------------------------------------------    Component Value Date/Time   BNP 50.0 11/01/2016 0535    CBG: No results for input(s): GLUCAP in the last 168 hours.  No results found for this or any previous visit (from the past 240 hours).    Radiology  Studies: No results found.   Nilda Fendt, MD, PhD [facility]  Between 7 am - 7 pm I am available, please contact me via Amion (for emergencies) or Securechat (non urgent messages)  Between 7 pm - 7 am I am not available, please contact night coverage MD/APP via Amion  "

## 2024-11-22 ENCOUNTER — Other Ambulatory Visit: Payer: Self-pay

## 2024-11-22 ENCOUNTER — Ambulatory Visit

## 2024-11-22 DIAGNOSIS — Z79899 Other long term (current) drug therapy: Secondary | ICD-10-CM

## 2024-11-22 DIAGNOSIS — R4589 Other symptoms and signs involving emotional state: Secondary | ICD-10-CM

## 2024-11-22 LAB — CBC
HCT: 25.4 % — ABNORMAL LOW (ref 36.0–46.0)
Hemoglobin: 8 g/dL — ABNORMAL LOW (ref 12.0–15.0)
MCH: 28.1 pg (ref 26.0–34.0)
MCHC: 31.5 g/dL (ref 30.0–36.0)
MCV: 89.1 fL (ref 80.0–100.0)
Platelets: 37 K/uL — ABNORMAL LOW (ref 150–400)
RBC: 2.85 MIL/uL — ABNORMAL LOW (ref 3.87–5.11)
RDW: 16.5 % — ABNORMAL HIGH (ref 11.5–15.5)
WBC: 5.3 K/uL (ref 4.0–10.5)
nRBC: 29.8 % — ABNORMAL HIGH (ref 0.0–0.2)

## 2024-11-22 LAB — RAD ONC ARIA SESSION SUMMARY
Course Elapsed Days: 12
Plan Fractions Treated to Date: 9
Plan Prescribed Dose Per Fraction: 2.5 Gy
Plan Total Fractions Prescribed: 10
Plan Total Prescribed Dose: 25 Gy
Reference Point Dosage Given to Date: 22.5 Gy
Reference Point Session Dosage Given: 2.5 Gy
Session Number: 9

## 2024-11-22 LAB — BASIC METABOLIC PANEL WITH GFR
Anion gap: 6 (ref 5–15)
BUN: 12 mg/dL (ref 6–20)
CO2: 26 mmol/L (ref 22–32)
Calcium: 7.9 mg/dL — ABNORMAL LOW (ref 8.9–10.3)
Chloride: 104 mmol/L (ref 98–111)
Creatinine, Ser: 0.42 mg/dL — ABNORMAL LOW (ref 0.44–1.00)
GFR, Estimated: >60 mL/min
Glucose, Bld: 97 mg/dL (ref 70–99)
Potassium: 4 mmol/L (ref 3.5–5.1)
Sodium: 137 mmol/L (ref 135–145)

## 2024-11-22 NOTE — Plan of Care (Signed)

## 2024-11-22 NOTE — Progress Notes (Signed)
 " PROGRESS NOTE  Carla Hanson FMW:[id number] DOB: December 06, 1965 DOA: 11/07/2024 PCP: Anders Otto DASEN, MD   LOS: 15 days   Brief Narrative / Interim history: 59 year old female history of obesity, HTN, COPD, prior DVT and SAH no longer anticoagulation, relapsed refractory multiple myeloma who comes into the hospital with shortness of breath, found to have pleural effusion, also found to have worsening T6 compression fracture.  Oncology and radiation oncology consulted, currently undergoing XRT  Subjective / 24h Interval events: No complaints, just got back from radiation therapy  Assesement and Plan: Principal problem Compression fracture of the T6 -on recent imaging this appears worse, initially diagnosed in 2025.  There is concern for progression of spinal cord impingement.  She has no extremity weakness, bowel or bladder dysfunction.  Radiation oncology consulted, she is getting XRT, about to go to radiation today.  She has been placed on Decadron , tapering off  Active problems Acute respiratory failure hypoxia, possible pneumonia-status post 5 days of Rocephin  and doxycycline  as well as diuretics.  Stable, no respiratory symptoms, remains on room air  Thrombocytopenia-platelet transfusion for less than 10K or less than 50K with bleeding.  Platelets overall stable, 37K today  Anemia-no bleeding, hemoglobin overall stable, 8.0 today  PA flutter-continue amiodarone , diltiazem .  Not on anticoagulation due to thrombocytopenia and history of SAH  Pressure injury of skin-stage II, left buttock, POA  Acute on chronic diastolic CHF-status post IV Lasix , currently euvolemic.  Multiple myeloma-seeing hematology as an outpatient.  Hypercalcemia of malignancy-status post 7 days of calcitonin.  Calcium  stable  Hypokalemia-continue to supplement potassium and monitor.  Most recently normalized  COPD-stable, no wheezing, on room air  History of PE-not on anticoagulation for reasons  above  Essential hypertension-continue diltiazem , blood pressure is stable  Obesity, morbid-BMI over 40.  She would benefit from weight loss  Scheduled Meds:  acyclovir   400 mg Oral BID   amiodarone   200 mg Oral BID   dexamethasone   4 mg Oral Daily   diltiazem   240 mg Oral Daily   ferrous sulfate   325 mg Oral Q breakfast   fluticasone  furoate-vilanterol  1 puff Inhalation Daily   folic acid   1 mg Oral BID   loratadine   10 mg Oral Daily   polyethylene glycol  17 g Oral Daily   potassium chloride   20 mEq Oral Daily   thiamine   100 mg Oral Daily   Continuous Infusions: PRN Meds:.acetaminophen  **OR** acetaminophen , albuterol , alum & mag hydroxide-simeth, bisacodyl , HYDROmorphone  (DILAUDID ) injection, mouth rinse, oxyCODONE   Current Outpatient Medications  Medication Instructions   acyclovir  (ZOVIRAX ) 400 mg, Oral, 2 times daily   albuterol  (PROVENTIL ) 2.5 mg, Nebulization, Every 6 hours PRN   albuterol  (VENTOLIN  HFA) 108 (90 Base) MCG/ACT inhaler 2 puffs, Inhalation, Every 6 hours PRN   amiodarone  (PACERONE ) 200 mg, Oral, 2 times daily   calcitonin, salmon, (MIACALCIN /FORTICAL) 200 UNIT/ACT nasal spray 1 spray, Alternating Nares, Daily   cetirizine  (ZYRTEC ) 10 mg, Oral, Daily   diltiazem  (CARDIZEM  CD) 240 mg, Oral, Daily   ferrous sulfate  325 mg, Oral, Daily with breakfast   folic acid  (FOLVITE ) 1 mg, Oral, 2 times daily   potassium chloride  SA (KLOR-CON  M) 20 MEQ tablet 20 mEq, Oral, 2 times daily   prochlorperazine  (COMPAZINE ) 10 MG tablet Take 1 tablet (10 mg total) by mouth every 6 (six) hours as needed for nausea or vomiting   SYMBICORT  160-4.5 MCG/ACT inhaler Inhalation   SYSTANE ULTRA 0.4-0.3 % SOLN 1 drop, Both Eyes, 3 times daily  PRN   thiamine  (VITAMIN B1) 100 mg, Oral, Daily   tiZANidine  (ZANAFLEX ) 4 mg, Oral, 2 times daily PRN   TYLENOL  500-1,000 mg, Oral, Every 6 hours PRN    Diet Orders (From admission, onward)     Start     Ordered   11/07/24 2056  Diet Heart  Room service appropriate? Yes; Fluid consistency: Thin; Fluid restriction: 1500 mL Fluid  Diet effective now       Question Answer Comment  Room service appropriate? Yes   Fluid consistency: Thin   Fluid restriction: 1500 mL Fluid      11/07/24 2056            DVT prophylaxis: SCDs Start: 11/07/24 2056   Lab Results  Component Value Date   PLT 37 (L) 11/22/2024      Code Status: Full Code  Family Communication: Patient's mother present at bedside  Status is: Inpatient Remains inpatient appropriate because: Radiation therapy   Level of care: Med-Surg  Consultants:  Radiation oncology Palliative care  Objective: Vitals:   11/21/24 1228 11/21/24 2123 11/22/24 0702 11/22/24 0930  BP: 133/70 109/87 115/72   Pulse: 76 66 63   Resp:  18 18   Temp: 98.7 F (37.1 C) 98.2 F (36.8 C) 98.1 F (36.7 C)   TempSrc: Oral Oral Oral   SpO2: 99% 100% 100% 100%  Weight:      Height:        Intake/Output Summary (Last 24 hours) at 11/22/2024 1114 Last data filed at 11/22/2024 1025 Gross per 24 hour  Intake 357 ml  Output 100 ml  Net 257 ml   Wt Readings from Last 3 Encounters:  11/07/24 92.6 kg  10/20/24 92.6 kg  09/22/24 96.6 kg    Examination:  Constitutional: NAD Eyes: lids and conjunctivae normal, no scleral icterus ENMT: mmm Neck: normal, supple Respiratory: clear to auscultation bilaterally, no wheezing, no crackles. Normal respiratory effort.  Cardiovascular: Regular rate and rhythm, no murmurs / rubs / gallops. No LE edema. Abdomen: soft, no distention, no tenderness. Bowel sounds positive.   Data Reviewed: I have independently reviewed following labs and imaging studies   CBC Recent Labs  Lab 11/17/24 0716 11/20/24 0613 11/22/24 0532  WBC 10.8* 9.2 5.3  HGB 8.7* 9.0* 8.0*  HCT 27.5* 27.6* 25.4*  PLT 31* 42* 37*  MCV 88.7 87.9 89.1  MCH 28.1 28.7 28.1  MCHC 31.6 32.6 31.5  RDW 15.7* 16.0* 16.5*    Recent Labs  Lab 11/17/24 0716  11/20/24 0613 11/22/24 0532  NA 134* 136 137  K 4.6 4.3 4.0  CL 100 102 104  CO2 26 24 26   GLUCOSE 145* 162* 97  BUN 20 18 12   CREATININE 0.43* 0.56 0.42*  CALCIUM  8.5* 8.2* 7.9*  AST 32 31  --   ALT 26 36  --   ALKPHOS 110 118  --   BILITOT 0.4 0.4  --   ALBUMIN 3.1* 3.4*  --   MG  --  1.9  --     ------------------------------------------------------------------------------------------------------------------ No results for input(s): CHOL, HDL, LDLCALC, TRIG, CHOLHDL, LDLDIRECT in the last 72 hours.  Lab Results  Component Value Date   HGBA1C 6.6 (H) 08/04/2024   ------------------------------------------------------------------------------------------------------------------ No results for input(s): TSH, T4TOTAL, T3FREE, THYROIDAB in the last 72 hours.  Invalid input(s): FREET3  Cardiac Enzymes No results for input(s): CKMB, TROPONINI, MYOGLOBIN in the last 168 hours.  Invalid input(s): CK ------------------------------------------------------------------------------------------------------------------    Component Value Date/Time  BNP 50.0 11/01/2016 0535    CBG: No results for input(s): GLUCAP in the last 168 hours.  No results found for this or any previous visit (from the past 240 hours).    Radiology Studies: No results found.   Nilda Fendt, MD, PhD [facility]  Between 7 am - 7 pm I am available, please contact me via Amion (for emergencies) or Securechat (non urgent messages)  Between 7 pm - 7 am I am not available, please contact night coverage MD/APP via Amion  "

## 2024-11-22 NOTE — TOC Progression Note (Addendum)
 Transition of Care [facility]) - Progression Note    Patient Details  Name: Carla Hanson MRN: [id number] Date of Birth: [birth year]  Transition of [facility]) CM/SW Contact  Toy LITTIE Agar, RN Phone [phone]  11/22/2024, 2:22 PM  Clinical Narrative:    CM has reached out to [facility] per patients request. Kathlean Milian has been considering bed offer. CM spoke with [facility] admissions coordinator to determine if facility can make bed offer. Per Leontine she will send patients info to nursing for review and get back with this CM .    1459 CM received call from [place] at [facility] to update CM that nursing is to review patient and will determine if facility can accept patient.    Expected Discharge Plan: Home w Home Health Services Barriers to Discharge: Continued Medical Work up               Expected Discharge Plan and Services In-house Referral: NA Discharge Planning Services: CM Consult Post Acute Care Choice:  (patient states that she has HH PT /OT/RN/ (BSC , shower chair, rollator, walker, wheelchair)) Living arrangements for the past 2 months: Single Family Home                 DME Arranged: N/A DME Agency: NA       HH Arranged: NA HH Agency: NA         Social Drivers of Health (SDOH) Interventions SDOH Screenings   Food Insecurity: No Food Insecurity (11/07/2024)  Housing: Low Risk (11/07/2024)  Transportation Needs: No Transportation Needs (11/07/2024)  Utilities: Not At Risk (11/07/2024)  Depression (PHQ2-9): Low Risk (09/08/2024)  Financial Resource Strain: Low Risk (05/14/2024)  Physical Activity: Insufficiently Active (05/14/2024)  Social Connections: Moderately Integrated (10/20/2024)  Stress: No Stress Concern Present (05/14/2024)  Tobacco Use: Medium Risk (11/08/2024)    Readmission Risk Interventions    11/09/2024    3:02 PM 10/26/2024    2:05 PM 02/03/2024    1:14 PM  Readmission Risk Prevention Plan  Transportation Screening Complete Complete  Complete  PCP or Specialist Appt within 3-5 Days Complete Complete Complete  HRI or Home Care Consult Complete Complete   Social Work Consult for Recovery Care Planning/Counseling Complete Complete Complete  Palliative Care Screening Not Applicable  Not Applicable  Medication Review Ocean[facility]) Referral to Pharmacy Complete Complete

## 2024-11-22 NOTE — Progress Notes (Signed)
 Physical Therapy Treatment Patient Details Name: Carla Hanson MRN: [id number] DOB: September 03, 1966 Today's Date: 11/22/2024   History of Present Illness Pt admitted with acute respiratory failure due to bilateral pleural effusions, T6 compression fx with mild cord compression in the setting of refractory multiple myeloma. Neurosurgery does not recommend surgical intervention. 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  Required Max encouragement.  Slight anxiety.  Mom in room visiting, wanting to see Pt move.  Pt only agreed to EOB but then ended up standing a few times.  General transfer comment: Assisted with sit to stand three time requiring + 2 assist such that one Therapist could support/block knees.  Partial upright stance time was 25 sec, 35 sec then 40 seconds.  Max c/o B LE weakness.  Muscles quivering.  Max c/o weakness B LE's.   prolonged bed bound. LPT has rec Pt will need ST Rehab at SNF to address mobility and functional decline prior to safely returning home.    If plan is discharge home, recommend the following: Two people to help with walking and/or transfers;A lot of help with bathing/dressing/bathroom;Assistance with cooking/housework;Assist for transportation;Help with stairs or ramp for entrance   Can travel by private vehicle     No  Equipment Recommendations  Hospital bed;Hoyer lift    Recommendations for Other Services       Precautions / Restrictions Precautions Precautions: None Precaution/Restrictions Comments: back recent T6 comp Fx Restrictions Weight Bearing Restrictions Per Provider Order: No     Mobility  Bed Mobility   Bed Mobility: Supine to Sit,  Sit to Supine Rolling: Mod assist, +2 for physical assistance, +2 for safety/equipment Sidelying to sit: Mod assist, +2 for physical assistance, +2 for safety/equipment   Sit to supine: HOB elevated, Used rails, Mod assist, Min assist   General bed mobility comments: required increased time and VC's on partial log roll requiring HOB elevated.  Increased assist back to bed.    Transfers Overall transfer level: Needs assistance Equipment used: Rolling walker (2 wheels) Transfers: Sit to/from Stand Sit to Stand: Max assist, +2 physical assistance, +2 safety/equipment, From elevated surface           General transfer comment: Assisted with sit to stand three time requiring + 2 assist such that one Therapist could support/block knees.  Partial upright stance time was 25 sec, 35 sec then 40 seconds.  Max c/o B LE weakness.  Muscles quivering.  Max c/o weakness B LE's.   prolonged bed bound.    Ambulation/Gait               General Gait Details: unable to attempt due to B LE weakness   Stairs             Wheelchair Mobility     Tilt Bed    Modified Rankin (Stroke Patients Only)       Balance                                            Communication Communication Communication: No apparent difficulties  Cognition Arousal: Alert Behavior During Therapy: Anxious,  Lability   PT - Cognitive impairments: No apparent impairments                       PT - Cognition Comments: AxO x 3 pleasant Lady.  Required Max encouragement.  Slight anxiety. Following commands: Intact      Cueing Cueing Techniques: Verbal cues, Tactile cues  Exercises      General Comments        Pertinent Vitals/Pain Pain Assessment Pain Assessment: Faces Faces Pain Scale: Hurts a little bit Pain Location: back Pain Descriptors / Indicators: Aching, Discomfort, Grimacing Pain Intervention(s): Monitored during session    Home Living                           Prior Function            PT Goals (current goals can now be found in the care plan section) Progress towards PT goals: Progressing toward goals    Frequency    Min 3X/week      PT Plan      Co-evaluation              AM-PAC PT 6 Clicks Mobility   Outcome Measure  Help needed turning from your back to your side while in a flat bed without using bedrails?: A Lot Help needed moving from lying on your back to sitting on the side of a flat bed without using bedrails?: A Lot Help needed moving to and from a bed to a chair (including a wheelchair)?: A Lot Help needed standing up from a chair using your arms (e.g., wheelchair or bedside chair)?: A Lot Help needed to walk in hospital room?: Total Help needed climbing 3-5 steps with a railing? : Total 6 Click Score: 10    End of Session Equipment Utilized During Treatment: Gait belt Activity Tolerance: Patient limited by fatigue Patient left: in bed;with call bell/phone within reach;with bed alarm set;with family/visitor present   PT Visit Diagnosis: Unsteadiness on feet (R26.81);Muscle weakness (generalized) (M62.81);Other abnormalities of gait and mobility (R26.89)     Time: 8567-8541 PT Time Calculation (min) (ACUTE ONLY): 26 min  Charges:    $Therapeutic Activity: 23-37 mins PT General Charges $$ ACUTE PT VISIT: 1 Visit                     Katheryn Leap  PTA Acute  Rehabilitation Services Office M-F          [phone]

## 2024-11-23 ENCOUNTER — Ambulatory Visit

## 2024-11-23 ENCOUNTER — Other Ambulatory Visit: Payer: Self-pay

## 2024-11-23 LAB — RAD ONC ARIA SESSION SUMMARY
Course Elapsed Days: 13
Plan Fractions Treated to Date: 10
Plan Prescribed Dose Per Fraction: 2.5 Gy
Plan Total Fractions Prescribed: 10
Plan Total Prescribed Dose: 25 Gy
Reference Point Dosage Given to Date: 25 Gy
Reference Point Session Dosage Given: 2.5 Gy
Session Number: 10

## 2024-11-23 MED ORDER — ZINC OXIDE 12.8 % EX OINT
1.0000 | TOPICAL_OINTMENT | CUTANEOUS | Status: DC | PRN
  Filled 2024-11-23: qty 56.7

## 2024-11-23 NOTE — Progress Notes (Signed)
 " PROGRESS NOTE  Carla Hanson FMW:[id number] DOB: 12-Aug-1966 DOA: 11/07/2024 PCP: Anders Otto DASEN, MD   LOS: 16 days   Brief Narrative / Interim history: 59 year old female history of obesity, HTN, COPD, prior DVT and SAH no longer anticoagulation, relapsed refractory multiple myeloma who comes into the hospital with shortness of breath, found to have pleural effusion, also found to have worsening T6 compression fracture.  Oncology and radiation oncology consulted, currently undergoing XRT  Subjective / 24h Interval events: She feels well.  She is looking forward to last radiation treatment this afternoon and looking forward to rehab.  Assesement and Plan: Principal problem Compression fracture of the T6 -on recent imaging this appears worse, initially diagnosed in 2025.  There is concern for progression of spinal cord impingement.  She has no extremity weakness, bowel or bladder dysfunction.  Radiation oncology consulted, she is getting XRT, last treatment today.  She has been placed on Decadron , continue to taper off  Active problems Acute respiratory failure hypoxia, possible pneumonia-status post 5 days of Rocephin  and doxycycline  as well as diuretics.  Respiratory status has remained stable, she is on room air  Thrombocytopenia-platelet transfusion for less than 10K or less than 50K with bleeding.  Platelets overall stable  Anemia-no bleeding, hemoglobin overall stable  PA flutter-continue amiodarone , diltiazem .  Not on anticoagulation due to thrombocytopenia and history of SAH  Pressure injury of skin-stage II, left buttock, POA  Acute on chronic diastolic CHF-status post IV Lasix , currently euvolemic.  Multiple myeloma-seeing hematology as an outpatient.  Hypercalcemia of malignancy-status post 7 days of calcitonin.  Calcium  stable  Hypokalemia-continue to supplement potassium and monitor.  Now normalized  COPD-stable, no wheezing, on room air  History of PE-not on  anticoagulation for reasons above  Essential hypertension-continue diltiazem , blood pressure is stable  Obesity, morbid-BMI over 40.  She would benefit from weight loss  Disposition-awaiting to hear from [facility] if they can accept patient, if they do, she will need insurance authorization  Scheduled Meds:  acyclovir   400 mg Oral BID   amiodarone   200 mg Oral BID   dexamethasone   4 mg Oral Daily   diltiazem   240 mg Oral Daily   ferrous sulfate   325 mg Oral Q breakfast   fluticasone  furoate-vilanterol  1 puff Inhalation Daily   folic acid   1 mg Oral BID   loratadine   10 mg Oral Daily   polyethylene glycol  17 g Oral Daily   potassium chloride   20 mEq Oral Daily   thiamine   100 mg Oral Daily   Continuous Infusions: PRN Meds:.acetaminophen  **OR** acetaminophen , albuterol , alum & mag hydroxide-simeth, bisacodyl , HYDROmorphone  (DILAUDID ) injection, mouth rinse, oxyCODONE , Zinc  Oxide  Current Outpatient Medications  Medication Instructions   acyclovir  (ZOVIRAX ) 400 mg, Oral, 2 times daily   albuterol  (PROVENTIL ) 2.5 mg, Nebulization, Every 6 hours PRN   albuterol  (VENTOLIN  HFA) 108 (90 Base) MCG/ACT inhaler 2 puffs, Inhalation, Every 6 hours PRN   amiodarone  (PACERONE ) 200 mg, Oral, 2 times daily   calcitonin, salmon, (MIACALCIN /FORTICAL) 200 UNIT/ACT nasal spray 1 spray, Alternating Nares, Daily   cetirizine  (ZYRTEC ) 10 mg, Oral, Daily   diltiazem  (CARDIZEM  CD) 240 mg, Oral, Daily   ferrous sulfate  325 mg, Oral, Daily with breakfast   folic acid  (FOLVITE ) 1 mg, Oral, 2 times daily   potassium chloride  SA (KLOR-CON  M) 20 MEQ tablet 20 mEq, Oral, 2 times daily   prochlorperazine  (COMPAZINE ) 10 MG tablet Take 1 tablet (10 mg total) by mouth every 6 (  six) hours as needed for nausea or vomiting   SYMBICORT  160-4.5 MCG/ACT inhaler Inhalation   SYSTANE ULTRA 0.4-0.3 % SOLN 1 drop, Both Eyes, 3 times daily PRN   thiamine  (VITAMIN B1) 100 mg, Oral, Daily   tiZANidine  (ZANAFLEX ) 4 mg,  Oral, 2 times daily PRN   TYLENOL  500-1,000 mg, Oral, Every 6 hours PRN    Diet Orders (From admission, onward)     Start     Ordered   11/07/24 2056  Diet Heart Room service appropriate? Yes; Fluid consistency: Thin; Fluid restriction: 1500 mL Fluid  Diet effective now       Question Answer Comment  Room service appropriate? Yes   Fluid consistency: Thin   Fluid restriction: 1500 mL Fluid      11/07/24 2056            DVT prophylaxis: SCDs Start: 11/07/24 2056   Lab Results  Component Value Date   PLT 37 (L) 11/22/2024      Code Status: Full Code  Family Communication: No family at bedside  Status is: Inpatient Remains inpatient appropriate because: Awaiting placement   Level of care: Med-Surg  Consultants:  Radiation oncology Palliative care  Objective: Vitals:   11/22/24 1520 11/22/24 2134 11/23/24 0647 11/23/24 0814  BP: 118/73 (!) 140/69 132/83   Pulse: 79 74 73   Resp: 16 15 16    Temp: 98.4 F (36.9 C) 98.8 F (37.1 C) 98.2 F (36.8 C)   TempSrc: Oral     SpO2: 97% 96% 98% 100%  Weight:      Height:        Intake/Output Summary (Last 24 hours) at 11/23/2024 1135 Last data filed at 11/23/2024 9346 Gross per 24 hour  Intake 240 ml  Output 550 ml  Net -310 ml   Wt Readings from Last 3 Encounters:  11/07/24 92.6 kg  10/20/24 92.6 kg  09/22/24 96.6 kg    Examination:  Constitutional: NAD Eyes: lids and conjunctivae normal, no scleral icterus ENMT: mmm Neck: normal, supple Respiratory: clear to auscultation bilaterally, no wheezing, no crackles. Normal respiratory effort.  Cardiovascular: Regular rate and rhythm, no murmurs / rubs / gallops. No LE edema. Abdomen: soft, no distention, no tenderness. Bowel sounds positive.   Data Reviewed: I have independently reviewed following labs and imaging studies   CBC Recent Labs  Lab 11/17/24 0716 11/20/24 0613 11/22/24 0532  WBC 10.8* 9.2 5.3  HGB 8.7* 9.0* 8.0*  HCT 27.5* 27.6* 25.4*   PLT 31* 42* 37*  MCV 88.7 87.9 89.1  MCH 28.1 28.7 28.1  MCHC 31.6 32.6 31.5  RDW 15.7* 16.0* 16.5*    Recent Labs  Lab 11/17/24 0716 11/20/24 0613 11/22/24 0532  NA 134* 136 137  K 4.6 4.3 4.0  CL 100 102 104  CO2 26 24 26   GLUCOSE 145* 162* 97  BUN 20 18 12   CREATININE 0.43* 0.56 0.42*  CALCIUM  8.5* 8.2* 7.9*  AST 32 31  --   ALT 26 36  --   ALKPHOS 110 118  --   BILITOT 0.4 0.4  --   ALBUMIN 3.1* 3.4*  --   MG  --  1.9  --     ------------------------------------------------------------------------------------------------------------------ No results for input(s): CHOL, HDL, LDLCALC, TRIG, CHOLHDL, LDLDIRECT in the last 72 hours.  Lab Results  Component Value Date   HGBA1C 6.6 (H) 08/04/2024   ------------------------------------------------------------------------------------------------------------------ No results for input(s): TSH, T4TOTAL, T3FREE, THYROIDAB in the last 72 hours.  Invalid input(s):  FREET3  Cardiac Enzymes No results for input(s): CKMB, TROPONINI, MYOGLOBIN in the last 168 hours.  Invalid input(s): CK ------------------------------------------------------------------------------------------------------------------    Component Value Date/Time   BNP 50.0 11/01/2016 0535    CBG: No results for input(s): GLUCAP in the last 168 hours.  No results found for this or any previous visit (from the past 240 hours).    Radiology Studies: No results found.   Nilda Fendt, MD, PhD [facility]  Between 7 am - 7 pm I am available, please contact me via Amion (for emergencies) or Securechat (non urgent messages)  Between 7 pm - 7 am I am not available, please contact night coverage MD/APP via Amion  "

## 2024-11-23 NOTE — Progress Notes (Signed)
 "                                                                                                                                                                                                          Daily Progress Note   Patient Name: Carla Hanson       Date: 11/23/2024 DOB: [birth year]  Age: 59 y.o. MRN#: [id number] Attending Physician: Trixie Nilda HERO, MD Primary Care Physician: Anders Otto DASEN, MD Admit Date: 11/07/2024  Reason for Consultation/Follow-up: Establishing goals of care  Subjective: Awake alert, resting in bed, anticipates finishing up radiation today, is awaiting SNF rehab, discussed with patient, TOC note reviewed.   Length of Stay: 16  Current Medications: Scheduled Meds:   acyclovir   400 mg Oral BID   amiodarone   200 mg Oral BID   dexamethasone   4 mg Oral Daily   diltiazem   240 mg Oral Daily   ferrous sulfate   325 mg Oral Q breakfast   fluticasone  furoate-vilanterol  1 puff Inhalation Daily   folic acid   1 mg Oral BID   loratadine   10 mg Oral Daily   polyethylene glycol  17 g Oral Daily   potassium chloride   20 mEq Oral Daily   thiamine   100 mg Oral Daily    Continuous Infusions:   PRN Meds: acetaminophen  **OR** acetaminophen , albuterol , alum & mag hydroxide-simeth, bisacodyl , HYDROmorphone  (DILAUDID ) injection, mouth rinse, oxyCODONE , Zinc  Oxide  Physical Exam         Awake alert No distress Has peripheral edema Generalized weakness Regular work of breathing.   Vital Signs: BP 119/62 (BP Location: Right Arm)   Pulse 79   Temp 98.8 F (37.1 C) (Oral)   Resp 16   Ht 5' 1.5 (1.562 m)   Wt 92.6 kg   LMP  (LMP Unknown)   SpO2 97%   BMI 37.95 kg/m  SpO2: SpO2: 97 % O2 Device: O2 Device: Room Air O2 Flow Rate: O2 Flow Rate (L/min): (S) 3 L/min (down to 1 lpm post tx- rn aware)  Intake/output summary:  Intake/Output Summary (Last 24 hours) at 11/23/2024 1415 Last data filed at 11/23/2024 1338 Gross per 24 hour  Intake --  Output 950 ml   Net -950 ml   LBM: Last BM Date : 11/22/24 Baseline Weight: Weight: 92.6 kg Most recent weight: Weight: 92.6 kg       Palliative Assessment/Data:      Patient Active Problem List   Diagnosis Date Noted   Medication management 11/21/2024   Pressure injury of skin 11/10/2024  Atrial flutter (HCC) 11/10/2024   Compression fracture of T6 vertebra (HCC) 11/08/2024   Acute on chronic heart failure with preserved ejection fraction (HFpEF) (HCC) 11/08/2024   Acute respiratory failure with hypoxia (HCC) 11/07/2024   Pleural effusion 11/07/2024   Need for emotional support 11/03/2024   Pain 11/02/2024   Counseling and coordination of care 11/02/2024   Goals of care, counseling/discussion 11/02/2024   Palliative care encounter 11/02/2024   Thrombocytopenia 10/23/2024   Anemia due to antineoplastic agent 10/21/2024   Counseling regarding advance care planning and goals of care 10/21/2024   Symptomatic anemia 10/20/2024   Moderate protein-calorie malnutrition 09/21/2024   Chemotherapy-induced neutropenia 09/09/2024   Folate deficiency 09/09/2024   Pancytopenia (HCC) 09/08/2024   History of compression fracture of spine 04/12/2024   Electrolyte abnormality 02/10/2024   Edema leg 02/10/2024   Hypercalcemia of malignancy 02/02/2024   Multiple myeloma not having achieved remission (HCC) 02/02/2024   Hypokalemia 01/30/2024   BMI 40.0-44.9, adult (HCC) 12/18/2022   Chronic pain of both shoulders 09/05/2022   Vitamin D  deficiency 12/19/2020   Palpitation 12/03/2019   History of intracranial hemorrhage 08/02/2019   History of DVT of lower extremity 08/02/2019   History of seizure 08/02/2019   COPD, moderate (HCC) 12/11/2018   Anemia 02/11/2018   History of hemorrhagic cerebrovascular accident (CVA) without residual deficits 08/13/2017   Iron deficiency anemia 12/12/2016   Pre-diabetes 03/02/2013   History of pulmonary embolism 05/05/2012   Former tobacco use 08/23/2009    Hyperlipidemia 04/30/2007   Essential hypertension 01/01/2007    Palliative Care Assessment & Plan   Patient Profile:    Assessment: 59 year old female history of obesity, HTN, COPD, prior DVT and SAH no longer anticoagulation,  relapsed refractory multiple myeloma who comes into the hospital with shortness of breath, found to have pleural effusion, also found to have worsening T6 compression fracture.    currently undergoing XRT  PMT for pain.   Recommendations/Plan:  SNF rehab with palliative at [facility] is being arranged for Also recommend palliative support at the cancer center Full code full scope for now, discussed with patient.  Patient to complete course of radiation for T 6 compression fracture today.  Patient has Oxy IR PO PRN and IV Dilaudid  PRN for pain, discussed pain regimen.     Code Status:    Code Status Orders  (From admission, onward)           Start     Ordered   11/07/24 2056  Full code  Continuous       Question:  By:  Answer:  Consent: discussion documented in EHR   11/07/24 2056           Code Status History     Date Active Date Inactive Code Status Order ID Comments User Context   10/20/2024 1050 11/05/2024 2300 Full Code [id number]  Zella Katha HERO, MD ED   09/08/2024 1708 09/12/2024 1738 Full Code [id number]  Sebastian Toribio GAILS, MD ED   01/30/2024 2047 02/05/2024 2004 Full Code [id number]  Tobie Jorie SAUNDERS, MD Inpatient   11/08/2016 2132 11/13/2016 2053 Full Code [id number]  Michaela Aisha SQUIBB, MD ED   10/31/2016 2025 11/03/2016 1615 Full Code [id number]  Drusilla Sabas RAMAN, MD Inpatient   05/05/2012 0158 05/05/2012 2331 Full Code [id number]  Ritch, [place] BIRCH, MD Inpatient       Prognosis:  Unable to determine  Discharge Planning: Skilled Nursing Facility for rehab with Palliative care service follow-up  Care plan was  discussed with  patient.   Thank you for allowing the Palliative Medicine Team to assist in the care of this patient. I personally  spent a total of 35 minutes in the care of the patient today including preparing to see the patient, getting/reviewing separately obtained history, performing a medically appropriate exam/evaluation, counseling and educating, and coordinating care.      Greater than 50%  of this time was spent counseling and coordinating care related to the above assessment and plan.  Lonia Serve, MD  Please contact Palliative Medicine Team phone at [phone] for questions and concerns.       "

## 2024-11-23 NOTE — TOC Progression Note (Addendum)
 Transition of Care [facility]) - Progression Note    Patient Details  Name: Carla Hanson MRN: [id number] Date of Birth: 22-Oct-1966  Transition of Care [facility]) CM/SW Contact  Toy LITTIE Agar, RN Phone Number:[phone]  11/23/2024, 10:17 AM  Clinical Narrative:    CM spoke with [place] at [facility] to inquire if facility can accept patient. Per Asberry she is waiting to hear from DON who will be in a little late this am.   1530 Cm received call from [place] at [facility] with questions concerning patient and r. All questions have been answered. Asberry states that nursing is reviewing and that she will get back with CM for final determination. CM made Asberry aware that facility will need to get insurance auth. CM continues to await decision. Patient has no other bed offers and is agreeable to go to [facility] if offered a bed.    Expected Discharge Plan: Home w Home Health Services Barriers to Discharge: Continued Medical Work up               Expected Discharge Plan and Services In-house Referral: NA Discharge Planning Services: CM Consult Post Acute Care Choice:  (patient states that she has HH PT /OT/RN/ (BSC , shower chair, rollator, walker, wheelchair)) Living arrangements for the past 2 months: Single Family Home                 DME Arranged: N/A DME Agency: NA       HH Arranged: NA HH Agency: NA         Social Drivers of Health (SDOH) Interventions SDOH Screenings   Food Insecurity: No Food Insecurity (11/07/2024)  Housing: Low Risk (11/07/2024)  Transportation Needs: No Transportation Needs (11/07/2024)  Utilities: Not At Risk (11/07/2024)  Depression (PHQ2-9): Low Risk (09/08/2024)  Financial Resource Strain: Low Risk (05/14/2024)  Physical Activity: Insufficiently Active (05/14/2024)  Social Connections: Moderately Integrated (10/20/2024)  Stress: No Stress Concern Present (05/14/2024)  Tobacco Use: Medium Risk (11/08/2024)    Readmission Risk Interventions     11/09/2024    3:02 PM 10/26/2024    2:05 PM 02/03/2024    1:14 PM  Readmission Risk Prevention Plan  Transportation Screening Complete Complete Complete  PCP or Specialist Appt within 3-5 Days Complete Complete Complete  HRI or Home Care Consult Complete Complete   Social Work Consult for Recovery Care Planning/Counseling Complete Complete Complete  Palliative Care Screening Not Applicable  Not Applicable  Medication Review Oceanographer) Referral to Pharmacy Complete Complete

## 2024-11-23 NOTE — Plan of Care (Signed)
  Problem: Education: Goal: Knowledge of General Education information will improve Description: Including pain rating scale, medication(s)/side effects and non-pharmacologic comfort measures Outcome: Progressing   Problem: Health Behavior/Discharge Planning: Goal: Ability to manage health-related needs will improve Outcome: Progressing   Problem: Clinical Measurements: Goal: Ability to maintain clinical measurements within normal limits will improve Outcome: Progressing Goal: Will remain free from infection Outcome: Progressing Goal: Diagnostic test results will improve Outcome: Progressing Goal: Respiratory complications will improve Outcome: Progressing Goal: Cardiovascular complication will be avoided Outcome: Progressing   Problem: Nutrition: Goal: Adequate nutrition will be maintained Outcome: Progressing   Problem: Activity: Goal: Risk for activity intolerance will decrease Outcome: Progressing   Problem: Coping: Goal: Level of anxiety will decrease Outcome: Progressing   Problem: Elimination: Goal: Will not experience complications related to bowel motility Outcome: Progressing Goal: Will not experience complications related to urinary retention Outcome: Progressing   Problem: Safety: Goal: Ability to remain free from injury will improve Outcome: Progressing   Problem: Pain Managment: Goal: General experience of comfort will improve and/or be controlled Outcome: Progressing   Problem: Skin Integrity: Goal: Risk for impaired skin integrity will decrease Outcome: Progressing

## 2024-11-24 DIAGNOSIS — J9601 Acute respiratory failure with hypoxia: Secondary | ICD-10-CM | POA: Diagnosis not present

## 2024-11-24 LAB — CBC
HCT: 23.8 % — ABNORMAL LOW (ref 36.0–46.0)
Hemoglobin: 7.6 g/dL — ABNORMAL LOW (ref 12.0–15.0)
MCH: 28.3 pg (ref 26.0–34.0)
MCHC: 31.9 g/dL (ref 30.0–36.0)
MCV: 88.5 fL (ref 80.0–100.0)
Platelets: 39 K/uL — ABNORMAL LOW (ref 150–400)
RBC: 2.69 MIL/uL — ABNORMAL LOW (ref 3.87–5.11)
RDW: 16.9 % — ABNORMAL HIGH (ref 11.5–15.5)
WBC: 4.3 K/uL (ref 4.0–10.5)
nRBC: 29.5 % — ABNORMAL HIGH (ref 0.0–0.2)

## 2024-11-24 LAB — MAGNESIUM: Magnesium: 1.9 mg/dL (ref 1.7–2.4)

## 2024-11-24 LAB — BASIC METABOLIC PANEL WITH GFR
Anion gap: 7 (ref 5–15)
BUN: 12 mg/dL (ref 6–20)
CO2: 27 mmol/L (ref 22–32)
Calcium: 8.1 mg/dL — ABNORMAL LOW (ref 8.9–10.3)
Chloride: 106 mmol/L (ref 98–111)
Creatinine, Ser: 0.38 mg/dL — ABNORMAL LOW (ref 0.44–1.00)
GFR, Estimated: >60 mL/min
Glucose, Bld: 96 mg/dL (ref 70–99)
Potassium: 3.6 mmol/L (ref 3.5–5.1)
Sodium: 139 mmol/L (ref 135–145)

## 2024-11-24 LAB — PHOSPHORUS: Phosphorus: 3.1 mg/dL (ref 2.5–4.6)

## 2024-11-24 NOTE — Progress Notes (Signed)
 " PROGRESS NOTE    Carla Hanson  FMW:[id number] DOB: [birth year] DOA: 11/07/2024 PCP: Anders Otto DASEN, MD   Brief Narrative:  59 year old female history of obesity, HTN, COPD, prior DVT and SAH no longer anticoagulation, relapsed refractory multiple myeloma who comes into the hospital with shortness of breath, found to have pleural effusion, also found to have worsening T6 compression fracture.  Oncology and radiation oncology consulted, currently undergoing XRT  Medically stable for discharge - awaiting placement   Assessment & Plan:   Principal Problem:   Acute respiratory failure with hypoxia (HCC) Active Problems:   Compression fracture of T6 vertebra (HCC)   Thrombocytopenia   Pleural effusion   Atrial flutter (HCC)   Hyperlipidemia   Essential hypertension   History of pulmonary embolism   Iron deficiency anemia   History of hemorrhagic cerebrovascular accident (CVA) without residual deficits   COPD, moderate (HCC)   BMI 40.0-44.9, adult (HCC)   Hypokalemia   Hypercalcemia of malignancy   Multiple myeloma not having achieved remission (HCC)   Acute on chronic heart failure with preserved ejection fraction (HFpEF) (HCC)   Pressure injury of skin   Medication management   Compression fracture of the T6 -on recent imaging this appears worse, initially diagnosed in 2025.  There is concern for progression of spinal cord impingement.  She has no extremity weakness, bowel or bladder dysfunction.  Radiation oncology consulted, she is getting XRT, last treatment 1/20.  She has been placed on Decadron , continue to taper off  Acute respiratory failure hypoxia, possible pneumonia-status post 5 days of Rocephin  and doxycycline  as well as diuretics.  Respiratory status has remained stable, she is on room air   Thrombocytopenia-platelet transfusion for less than 10K or less than 50K with bleeding.  Platelets overall stable   Anemia-no bleeding, hemoglobin overall stable   PA  flutter-continue amiodarone , diltiazem .  Not on anticoagulation due to thrombocytopenia and history of SAH   Pressure injury of skin-stage II, left buttock, POA   Acute on chronic diastolic CHF-status post IV Lasix , currently euvolemic.   Multiple myeloma-seeing hematology as an outpatient.   Hypercalcemia of malignancy-status post 7 days of calcitonin.  Calcium  stable   Hypokalemia-continue to supplement potassium and monitor.  Now normalized   COPD-stable, no wheezing, on room air   History of PE-not on anticoagulation for reasons above   Essential hypertension-continue diltiazem , blood pressure is stable   Obesity, morbid-BMI over 40.  She would benefit from weight loss  DVT prophylaxis: SCDs Start: 11/07/24 2056   Code Status:   Code Status: Full Code  Family Communication: None present  Status is: Inpatient  Dispo: The patient is from: Home              Anticipated d/c is to: Facility              Anticipated d/c date is: Imminent              Patient currently is medically stable for discharge  Consultants:  Radiation oncology  Antimicrobials:  None  Subjective: No acute issues or events overnight denies nausea vomiting diarrhea constipation headache fevers chills or chest pain  Objective: Vitals:   11/23/24 1354 11/23/24 2119 11/24/24 0505 11/24/24 0528  BP: 119/62 132/71 120/64 130/70  Pulse: 79 79 78 74  Resp: 16 18 18 18   Temp: 98.8 F (37.1 C) 98.8 F (37.1 C) 98.7 F (37.1 C) 98.8 F (37.1 C)  TempSrc: Oral  Oral   SpO2:  97% 98% 100% 99%  Weight:      Height:        Intake/Output Summary (Last 24 hours) at 11/24/2024 0738 Last data filed at 11/24/2024 0104 Gross per 24 hour  Intake 240 ml  Output 400 ml  Net -160 ml   Filed Weights   11/07/24 2308  Weight: 92.6 kg    Examination:  General:  Pleasantly resting in bed, No acute distress. HEENT:  Normocephalic atraumatic.   Lungs:  Clear to auscultate bilaterally without rhonchi,  wheeze, or rales. Heart:  Regular rate and rhythm.   Abdomen:  Soft, obese, nontender, nondistended.  Data Reviewed: I have personally reviewed following labs and imaging studies  CBC: Recent Labs  Lab 11/20/24 0613 11/22/24 0532 11/24/24 0628  WBC 9.2 5.3 4.3  HGB 9.0* 8.0* 7.6*  HCT 27.6* 25.4* 23.8*  MCV 87.9 89.1 88.5  PLT 42* 37* 39*   Basic Metabolic Panel: Recent Labs  Lab 11/20/24 0613 11/22/24 0532 11/24/24 0628  NA 136 137 139  K 4.3 4.0 3.6  CL 102 104 106  CO2 24 26 27   GLUCOSE 162* 97 96  BUN 18 12 12   CREATININE 0.56 0.42* 0.38*  CALCIUM  8.2* 7.9* 8.1*  MG 1.9  --  1.9  PHOS  --   --  3.1   GFR: Estimated Creatinine Clearance: 80.3 mL/min (A) (by C-G formula based on SCr of 0.38 mg/dL (L)). Liver Function Tests: Recent Labs  Lab 11/20/24 0613  AST 31  ALT 36  ALKPHOS 118  BILITOT 0.4  PROT 6.1*  ALBUMIN 3.4*   BNP (last 3 results) Recent Labs    11/07/24 1643  PROBNP 2,001.0*    No results found for this or any previous visit (from the past 240 hours).   Radiology Studies: No results found.  Scheduled Meds:  acyclovir   400 mg Oral BID   amiodarone   200 mg Oral BID   dexamethasone   4 mg Oral Daily   diltiazem   240 mg Oral Daily   ferrous sulfate   325 mg Oral Q breakfast   fluticasone  furoate-vilanterol  1 puff Inhalation Daily   folic acid   1 mg Oral BID   loratadine   10 mg Oral Daily   polyethylene glycol  17 g Oral Daily   potassium chloride   20 mEq Oral Daily   thiamine   100 mg Oral Daily   Continuous Infusions:   LOS: 17 days   Time spent:  Elsie JAYSON [place], DO [facility]  If 7PM-7AM, please contact night-coverage [email or web address]  11/24/2024, 7:38 AM      "

## 2024-11-24 NOTE — Progress Notes (Signed)
 "                                                                                                                                                                                                          Daily Progress Note   Patient Name: Carla Hanson       Date: 11/24/2024 DOB: 1966-08-20  Age: 59 y.o. MRN#: [id number] Attending Physician: Lue Elsie BROCKS, MD Primary Care Physician: Anders Otto DASEN, MD Admit Date: 11/07/2024  Reason for Consultation/Follow-up: Establishing goals of care  Subjective: Awake alert, resting in bed, she has finished radiation    is awaiting SNF rehab, discussed with patient, TOC note reviewed. Patient has a lot of questions about which SNF rehab facility has offered her a bed and I have discussed with her to the best of my ability regarding differences between SNF rehab with palliative versus home with home health.   Length of Stay: 17  Current Medications: Scheduled Meds:   acyclovir   400 mg Oral BID   amiodarone   200 mg Oral BID   dexamethasone   4 mg Oral Daily   diltiazem   240 mg Oral Daily   ferrous sulfate   325 mg Oral Q breakfast   fluticasone  furoate-vilanterol  1 puff Inhalation Daily   folic acid   1 mg Oral BID   loratadine   10 mg Oral Daily   polyethylene glycol  17 g Oral Daily   potassium chloride   20 mEq Oral Daily   thiamine   100 mg Oral Daily    Continuous Infusions:   PRN Meds: acetaminophen  **OR** acetaminophen , albuterol , alum & mag hydroxide-simeth, bisacodyl , HYDROmorphone  (DILAUDID ) injection, mouth rinse, oxyCODONE , Zinc  Oxide  Physical Exam         Awake alert No distress Has peripheral edema - decreasing.  Generalized weakness Regular work of breathing.   Vital Signs: BP 130/70 (BP Location: Right Arm)   Pulse 74   Temp 98.8 F (37.1 C)   Resp 18   Ht 5' 1.5 (1.562 m)   Wt 92.6 kg   LMP  (LMP Unknown)   SpO2 98%   BMI 37.95 kg/m  SpO2: SpO2: 98 % O2 Device: O2 Device: Room Air O2 Flow Rate: O2 Flow  Rate (L/min): (S) 3 L/min (down to 1 lpm post tx- rn aware)  Intake/output summary:  Intake/Output Summary (Last 24 hours) at 11/24/2024 1155 Last data filed at 11/24/2024 1100 Gross per 24 hour  Intake 240 ml  Output 700 ml  Net -460 ml   LBM: Last BM Date : 11/22/24 Baseline Weight:  Weight: 92.6 kg Most recent weight: Weight: 92.6 kg       Palliative Assessment/Data:      Patient Active Problem List   Diagnosis Date Noted   Medication management 11/21/2024   Pressure injury of skin 11/10/2024   Atrial flutter (HCC) 11/10/2024   Compression fracture of T6 vertebra (HCC) 11/08/2024   Acute on chronic heart failure with preserved ejection fraction (HFpEF) (HCC) 11/08/2024   Acute respiratory failure with hypoxia (HCC) 11/07/2024   Pleural effusion 11/07/2024   Need for emotional support 11/03/2024   Pain 11/02/2024   Counseling and coordination of care 11/02/2024   Goals of care, counseling/discussion 11/02/2024   Palliative care encounter 11/02/2024   Thrombocytopenia 10/23/2024   Anemia due to antineoplastic agent 10/21/2024   Counseling regarding advance care planning and goals of care 10/21/2024   Symptomatic anemia 10/20/2024   Moderate protein-calorie malnutrition 09/21/2024   Chemotherapy-induced neutropenia 09/09/2024   Folate deficiency 09/09/2024   Pancytopenia (HCC) 09/08/2024   History of compression fracture of spine 04/12/2024   Electrolyte abnormality 02/10/2024   Edema leg 02/10/2024   Hypercalcemia of malignancy 02/02/2024   Multiple myeloma not having achieved remission (HCC) 02/02/2024   Hypokalemia 01/30/2024   BMI 40.0-44.9, adult (HCC) 12/18/2022   Chronic pain of both shoulders 09/05/2022   Vitamin D  deficiency 12/19/2020   Palpitation 12/03/2019   History of intracranial hemorrhage 08/02/2019   History of DVT of lower extremity 08/02/2019   History of seizure 08/02/2019   COPD, moderate (HCC) 12/11/2018   Anemia 02/11/2018   History of  hemorrhagic cerebrovascular accident (CVA) without residual deficits 08/13/2017   Iron deficiency anemia 12/12/2016   Pre-diabetes 03/02/2013   History of pulmonary embolism 05/05/2012   Former tobacco use 08/23/2009   Hyperlipidemia 04/30/2007   Essential hypertension 01/01/2007    Palliative Care Assessment & Plan   Patient Profile:    Assessment: 59 year old female history of obesity, HTN, COPD, prior DVT and SAH no longer anticoagulation,  relapsed refractory multiple myeloma who comes into the hospital with shortness of breath, found to have pleural effusion, also found to have worsening T6 compression fracture.    Currently has completed XRT  PMT for pain and GOC.   Recommendations/Plan: Cancer-related pain / T6 compression fracture Patient is completed the final session of radiation therapy today for T6 compression fracture. Pain regimen was reviewed in detail with the patient. He currently has oxycodone  IR PO as needed for baseline pain control and IV hydromorphone  as needed for breakthrough pain while inpatient. Discussed appropriate use of oral versus IV medications and expectations for pain control. Patient verbalized understanding.  Goals of care / Code status Goals of care were reviewed with the patient today. Patient remains full code with full scope of treatment at this time, and this was discussed and confirmed.  Disposition planning / Supportive care needs Discussed discharge planning in detail with the patient. Skilled nursing facility (SNF) rehabilitation with palliative care support at [facility] is being arranged. Explained the difference between SNF rehab with palliative involvement--which includes daily nursing care, physical therapy, medication management, and ongoing symptom-focused medical support--versus home-based home health care, which provides intermittent nursing and therapy visits without continuous clinical support. Patient expressed understanding of the  distinction and agreed that SNF rehab with palliative support is the most appropriate next level of care at this time.  Also recommended continued palliative care involvement through the cancer center for longitudinal symptom management and goals-of-care support after discharge.    Code  Status:    Code Status Orders  (From admission, onward)           Start     Ordered   11/07/24 2056  Full code  Continuous       Question:  By:  Answer:  Consent: discussion documented in EHR   11/07/24 2056           Code Status History     Date Active Date Inactive Code Status Order ID Comments User Context   10/20/2024 1050 11/05/2024 2300 Full Code [id number]  Zella Katha HERO, MD ED   09/08/2024 1708 09/12/2024 1738 Full Code [id number]  Sebastian Toribio GAILS, MD ED   01/30/2024 2047 02/05/2024 2004 Full Code [id number]  Tobie Jorie SAUNDERS, MD Inpatient   11/08/2016 2132 11/13/2016 2053 Full Code [id number]  Michaela Aisha SQUIBB, MD ED   10/31/2016 2025 11/03/2016 1615 Full Code [id number]  Drusilla Sabas RAMAN, MD Inpatient   05/05/2012 0158 05/05/2012 2331 Full Code [id number]  Ritch, [place] BIRCH, MD Inpatient       Prognosis:  Unable to determine  Discharge Planning: Skilled Nursing Facility for rehab with Palliative care service follow-up  Care plan was discussed with  patient.   Thank you for allowing the Palliative Medicine Team to assist in the care of this patient. I personally spent a total of 35 minutes in the care of the patient today including preparing to see the patient, getting/reviewing separately obtained history, performing a medically appropriate exam/evaluation, counseling and educating, and coordinating care.      Greater than 50%  of this time was spent counseling and coordinating care related to the above assessment and plan.  Lonia Serve, MD  Please contact Palliative Medicine Team phone at [phone] for questions and concerns.       "

## 2024-11-25 ENCOUNTER — Other Ambulatory Visit (HOSPITAL_COMMUNITY): Payer: Self-pay

## 2024-11-25 ENCOUNTER — Encounter: Payer: Self-pay | Admitting: Hematology

## 2024-11-25 ENCOUNTER — Other Ambulatory Visit: Payer: Self-pay

## 2024-11-25 DIAGNOSIS — J9601 Acute respiratory failure with hypoxia: Secondary | ICD-10-CM | POA: Diagnosis not present

## 2024-11-25 MED ORDER — POLYETHYLENE GLYCOL 3350 17 GM/SCOOP PO POWD
17.0000 g | Freq: Every day | ORAL | 0 refills | Status: AC
  Filled 2024-11-25: qty 238, 14d supply, fill #0

## 2024-11-25 MED ORDER — BISACODYL 5 MG PO TBEC
5.0000 mg | DELAYED_RELEASE_TABLET | Freq: Every day | ORAL | 0 refills | Status: AC | PRN
  Filled 2024-11-25 (×2): qty 30, 30d supply, fill #0

## 2024-11-25 MED ORDER — LORATADINE 10 MG PO TABS
10.0000 mg | ORAL_TABLET | Freq: Every day | ORAL | 0 refills | Status: AC
  Filled 2024-11-25 (×2): qty 30, 30d supply, fill #0

## 2024-11-25 MED ORDER — DEXAMETHASONE 4 MG PO TABS
4.0000 mg | ORAL_TABLET | Freq: Every day | ORAL | 0 refills | Status: AC
  Filled 2024-11-25 (×2): qty 30, 30d supply, fill #0

## 2024-11-25 MED ORDER — OXYCODONE HCL 5 MG PO TABS
5.0000 mg | ORAL_TABLET | Freq: Three times a day (TID) | ORAL | 0 refills | Status: AC | PRN
  Filled 2024-11-25: qty 9, 3d supply, fill #0

## 2024-11-25 NOTE — TOC Progression Note (Addendum)
 Transition of Care [facility]) - Progression Note    Patient Details  Name: Carla Hanson MRN: [id number] Date of Birth: May 26, 1966  Transition of [facility]) CM/SW Contact  Toy LITTIE Agar, RN Phone [phone]  11/25/2024, 11:36 AM  Clinical Narrative:    CM spoke with [facility] and facility can accept patient. Cm at bedside to update patient and patient states that she does not want to go to [facility] and prefers to go home. Patient states that family is preparing home for her to come home with home health.CM has explained to patient that MD may d/c today if medically stable. CM has made patient aware that CM can set up transportation but can not guarantee that she can stay for an extra day since she is currently refusing [facility] placement.  Message has been sent to MD.   [phone] Patient has discharge orders. Per nurse patient states that she does not have keys to get into her home and thee is no one there. Cm has instructed nurse to make CM aware once patient has access to the home and CM can arrange transport for safe disposition.  [id number] Discharge packet has been placed at nurses station.    Expected Discharge Plan: Home w Home Health Services Barriers to Discharge: Continued Medical Work up               Expected Discharge Plan and Services In-house Referral: NA Discharge Planning Services: CM Consult Post Acute Care Choice:  (patient states that she has HH PT /OT/RN/ (BSC , shower chair, rollator, walker, wheelchair)) Living arrangements for the past 2 months: Single Family Home                 DME Arranged: N/A DME Agency: NA       HH Arranged: NA HH Agency: NA         Social Drivers of Health (SDOH) Interventions SDOH Screenings   Food Insecurity: No Food Insecurity (11/07/2024)  Housing: Low Risk (11/07/2024)  Transportation Needs: No Transportation Needs (11/07/2024)  Utilities: Not At Risk (11/07/2024)  Depression (PHQ2-9): Low Risk  (09/08/2024)  Financial Resource Strain: Low Risk (05/14/2024)  Physical Activity: Insufficiently Active (05/14/2024)  Social Connections: Moderately Integrated (10/20/2024)  Stress: No Stress Concern Present (05/14/2024)  Tobacco Use: Medium Risk (11/08/2024)    Readmission Risk Interventions    11/09/2024    3:02 PM 10/26/2024    2:05 PM 02/03/2024    1:14 PM  Readmission Risk Prevention Plan  Transportation Screening Complete Complete Complete  PCP or Specialist Appt within 3-5 Days Complete Complete Complete  HRI or Home Care Consult Complete Complete   Social Work Consult for Recovery Care Planning/Counseling Complete Complete Complete  Palliative Care Screening Not Applicable  Not Applicable  Medication Review [employer]) Referral to Pharmacy Complete Complete

## 2024-11-25 NOTE — Progress Notes (Signed)
 PT Cancellation Note  Patient Details Name: Carla Hanson MRN: [id number] DOB: 16-Jul-1966   Cancelled Treatment:    Reason Eval/Treat Not Completed: Patient declined, no reason specified. Pt declined participation in therapy session reporting I can do it all when I get home. PT/OT presented various tasks pt will need to be able to do at home to address any safety concerns, pt continued to state I did it all before. PT/OT offered to come back at later time to attempt OOB activity, pt declined. Education provided for current level of assistance based on most recent treatment and safety with family assisting with mobility; pt verbalizes understanding to education.  Isaiah DEL. Carla Hanson, PT, DPT   [employer] 11/25/2024, 1:57 PM

## 2024-11-25 NOTE — Discharge Summary (Addendum)
 Physician Discharge Summary  Carla Hanson FMW:[id number] DOB: 29-Apr-1966 DOA: 11/07/2024  PCP: Anders Otto DASEN, MD  Admit date: 11/07/2024 Discharge date: 11/25/2024  Admitted From: Home Disposition: Home with home health  Recommendations for Outpatient Follow-up:  Follow up with PCP in 1-2 weeks Follow-up with oncology as scheduled   Home Health: PT OT Equipment/Devices: None  Discharge Condition: Stable CODE STATUS: Full Diet recommendation: Low-salt low-fat diet  Brief/Interim Summary: 59 year old female history of obesity, HTN, COPD, prior DVT and SAH no longer anticoagulation, relapsed refractory multiple myeloma who comes into the hospital with shortness of breath, found to have pleural effusion, also found to have worsening T6 compression fracture.  Oncology and radiation oncology consulted, completed XRT 11/23/2024  Patient remains medically stable for discharge -previously awaiting placement but now refusing discharge to facility despite bed offer.  She is otherwise stable and agreeable for discharge home, continue home health and therapy per recommendations.  Follow-up with PCP and oncology as scheduled.  Discharge Diagnoses:  Principal Problem:   Acute respiratory failure with hypoxia (HCC) Active Problems:   Compression fracture of T6 vertebra (HCC)   Thrombocytopenia   Pleural effusion   Atrial flutter (HCC)   Hyperlipidemia   Essential hypertension   History of pulmonary embolism   Iron deficiency anemia   History of hemorrhagic cerebrovascular accident (CVA) without residual deficits   COPD, moderate (HCC)   BMI 40.0-44.9, adult (HCC)   Hypokalemia   Hypercalcemia of malignancy   Multiple myeloma not having achieved remission (HCC)   Acute on chronic heart failure with preserved ejection fraction (HFpEF) (HCC)   Pressure injury of skin   Medication management  Compression fracture of the T6 -on recent imaging this appears worse, initially diagnosed in  2025.  There is concern for progression of spinal cord impingement.  She has no extremity weakness, bowel or bladder dysfunction.  Radiation oncology consulted, she is getting XRT, last treatment 1/20.  She has been placed on Decadron , continue to taper off   Acute respiratory failure hypoxia, possible pneumonia-status post 5 days of Rocephin  and doxycycline  as well as diuretics.  Respiratory status has remained stable, she is on room air   Thrombocytopenia- Platelets overall stable   Anemia, likely of chronic disease-no bleeding, hemoglobin overall stable   Paroxysmal atrial flutter-continue amiodarone , diltiazem .  Not on anticoagulation due to thrombocytopenia and history of SAH  Pressure injury of skin-stage II, left buttock, POA   Acute on chronic diastolic CHF-status post IV Lasix , currently euvolemic.   Multiple myeloma-seeing hematology as an outpatient.   Hypercalcemia of malignancy-status post 7 days of calcitonin.  Calcium  stable   Hypokalemia-continue to supplement potassium and monitor.  Now normalized   COPD-stable, no wheezing, on room air   History of PE-not on anticoagulation for reasons above   Essential hypertension-continue diltiazem , blood pressure is stable   Obesity, morbid-BMI over 40.  She would benefit from weight loss  Discharge Instructions  Discharge Instructions     Amb Referral to Palliative Care   Complete by: As directed    Call MD for:  difficulty breathing, headache or visual disturbances   Complete by: As directed    Call MD for:  extreme fatigue   Complete by: As directed    Call MD for:  hives   Complete by: As directed    Call MD for:  persistant dizziness or light-headedness   Complete by: As directed    Call MD for:  persistant nausea and vomiting  Complete by: As directed    Call MD for:  severe uncontrolled pain   Complete by: As directed    Call MD for:  temperature >100.4   Complete by: As directed    Diet - low sodium heart  healthy   Complete by: As directed    Increase activity slowly   Complete by: As directed    No wound care   Complete by: As directed       Allergies as of 11/25/2024       Reactions   Latuda  [lurasidone ] Other (See Comments)   Made leg muscles twitch (went off this herself in December, 2017)        Medication List     STOP taking these medications    calcitonin (salmon) 200 UNIT/ACT nasal spray Commonly known as: MIACALCIN /FORTICAL   prochlorperazine  10 MG tablet Commonly known as: COMPAZINE    tiZANidine  4 MG tablet Commonly known as: Zanaflex        TAKE these medications    acyclovir  400 MG tablet Commonly known as: ZOVIRAX  Take 1 tablet (400 mg total) by mouth 2 (two) times daily.   albuterol  (2.5 MG/3ML) 0.083% nebulizer solution Commonly known as: PROVENTIL  Take 3 mLs (2.5 mg total) by nebulization every 6 (six) hours as needed for wheezing or shortness of breath.   Ventolin  HFA 108 (90 Base) MCG/ACT inhaler Generic drug: albuterol  Inhale 2 puffs into the lungs every 6 (six) hours as needed for wheezing or shortness of breath.   amiodarone  200 MG tablet Commonly known as: PACERONE  Take 1 tablet (200 mg total) by mouth 2 (two) times daily.   bisacodyl  5 MG EC tablet Generic drug: bisacodyl  Take 1 tablet (5 mg total) by mouth daily as needed for moderate constipation.   cetirizine  10 MG tablet Commonly known as: ZYRTEC  Take 1 tablet (10 mg total) by mouth daily. What changed:  when to take this reasons to take this   dexamethasone  4 MG tablet Commonly known as: DECADRON  Take 1 tablet (4 mg total) by mouth daily. Start taking on: November 26, 2024   diltiazem  240 MG 24 hr capsule Commonly known as: CARDIZEM  CD Take 1 capsule (240 mg total) by mouth daily.   ferrous sulfate  325 (65 FE) MG tablet Take 325 mg by mouth daily with breakfast.   folic acid  1 MG tablet Commonly known as: FOLVITE  Take 1 tablet (1 mg total) by mouth 2 (two) times  daily.   loratadine  10 MG tablet Commonly known as: CLARITIN  Take 1 tablet (10 mg total) by mouth daily. Start taking on: November 26, 2024   oxyCODONE  5 MG immediate release tablet Commonly known as: Oxy IR/ROXICODONE  Take 1 tablet (5 mg total) by mouth every 8 (eight) hours as needed for up to 3 days for breakthrough pain.   polyethylene glycol powder 17 GM/SCOOP powder Commonly known as: GLYCOLAX /MIRALAX  Take 17 g by mouth daily. Start taking on: November 26, 2024   potassium chloride  SA 20 MEQ tablet Commonly known as: KLOR-CON  M Take 1 tablet (20 mEq total) by mouth 2 (two) times daily. What changed: how much to take   Symbicort  160-4.5 MCG/ACT inhaler Generic drug: budesonide -formoterol  Inhale into the lungs.   Systane Ultra 0.4-0.3 % Soln Generic drug: Polyethyl Glycol-Propyl Glycol Place 1 drop into both eyes 3 (three) times daily as needed (for dryness).   thiamine  100 MG tablet Commonly known as: VITAMIN B1 Take 1 tablet (100 mg total) by mouth daily.   TYLENOL  500 MG tablet  Generic drug: acetaminophen  Take 500-1,000 mg by mouth every 6 (six) hours as needed for mild pain (pain score 1-3) (or headaches).        Contact information for after-discharge care     Home Medical Care     [facility]) .   Service: Home Health Services Contact information: [phone] [place]  [phone]                    Allergies[1]  Consultations: Palliative care, radiation oncology  Procedures/Studies: DG Chest 1 View Result Date: 11/09/2024 EXAM: 1 VIEW(S) XRAY OF THE CHEST 11/09/2024 03:29:00 PM COMPARISON: 11/07/2024 CLINICAL HISTORY: Pleural effusion FINDINGS: LUNGS AND PLEURA: Mild bilateral interstitial opacities. Decreased small left pleural effusion. Unchanged small right pleural effusion. No pneumothorax. HEART AND MEDIASTINUM: Cardiomegaly. Aortic atherosclerosis. BONES AND SOFT TISSUES: No acute  osseous abnormality. IMPRESSION: 1. Decreased small left pleural effusion and unchanged small right pleural effusion. 2. Mild bilateral interstitial opacities. 3. Cardiomegaly and aortic atherosclerosis. Electronically signed by: Greig Pique MD 11/09/2024 09:18 PM EST RP Workstation: [id number]   MR THORACIC SPINE W WO CONTRAST Result Date: 11/08/2024 EXAM: MRI THORACIC SPINE WITH AND WITHOUT INTRAVENOUS CONTRAST 11/08/2024 12:08:51 PM TECHNIQUE: Multiplanar multisequence MRI of the thoracic spine was performed with and without the administration of 10 mL gadobutrol  (GADAVIST ) 1 MMOL/ML injection. COMPARISON: CT of the chest dated 11/07/2024. CLINICAL HISTORY: Spine metastases, thoracic, monitor. FINDINGS: BONES AND ALIGNMENT: Normal alignment. The thoracic vertebrae are all diffusely reduced in T1 signal intensity and/or heterogeneous in signal intensity on T2. There is a vertebral planar deformity of T6 again demonstrated. There are pathological fractures of T3 and T4. There is a downward bone deformity of the superior endplate of T12. The vertebral bodies also demonstrate heterogeneous contrast enhancement. The findings are highly suggestive of widespread metastatic disease involving the thoracic spine, epidural space, and paraspinous regions, with associated pathological fractures, vertebral deformities, and spinal canal stenosis. Follow-up imaging, potentially with a dedicated whole-body bone scan or PET/CT, may be considered for further assessment of disease extent. Clinical correlation with known primary malignancy is essential. SPINAL CORD: Normal spinal cord volume. Normal spinal cord signal. There are no definite spinal cord lesions. SOFT TISSUES: There is extensive enhancing epidural and paraspinous metastatic disease, which is resulting in multilevel spinal canal stenosis. There are mild bilateral pleural effusions. DEGENERATIVE CHANGES: No significant disc herniation. At T6, there is posterior bowing  of the posterior wall of the vertebral body, which abuts the ventral surface of the spinal cord. There is multilevel spinal canal stenosis resulting from extensive enhancing epidural and paraspinous metastatic disease. IMPRESSION: 1. Extensive enhancing epidural and paraspinous disease with diffuse abnormal/enhancing thoracic marrow signal resulting in multilevel spinal canal stenosis; at T6 there is posterior vertebral body bowing abutting the ventral cord without definite intramedullary cord lesion. Overall appearance is consistent with multiple myeloma. Recommend urgent neurosurgical and/or radiation oncology consultation to assess for impending cord compression. 2. Pathological fractures of T3 and T4, vertebral planar deformity of T6, and downward deformity of the superior endplate of T12. 3. Mild bilateral pleural effusions. Electronically signed by: Evalene Coho MD 11/08/2024 12:44 PM EST RP Workstation: [id number]   CT CHEST WO CONTRAST Result Date: 11/07/2024 EXAM: CT CHEST WITHOUT CONTRAST 11/07/2024 07:57:35 PM TECHNIQUE: CT of the chest was performed without the administration of intravenous contrast. Multiplanar reformatted images are provided for review. Automated exposure control, iterative reconstruction, and/or weight based adjustment of the mA/kV was utilized  to reduce the radiation dose to as low as reasonably achievable. COMPARISON: Portable chest films from 10/31/2024 and 10/26/2024, full body PET CT 02/12/2024, and CTA chest 10/31/2016. CLINICAL HISTORY: Pleural effusion, malignancy suspected. Patient has a history of multiple myeloma. FINDINGS: MEDIASTINUM: Heart is moderately enlarged. There is no pericardial effusion. There are calcifications in the left main and proximal LAD coronary artery. The cardiac blood pool is hypodense consistent with anemia. The pulmonary trunk is prominent measuring 3.9 cm indicating arterial hypertension (previously 3.8 cm). There is mild prominence of the  central veins. Mild aortic atherosclerosis without aneurysm. The great vessels branched normally. There is lobulated substernal soft tissue thickening in the mediastinum which is most likely related to myelomatous involvement of sternum with multiple lytic sternal lesions noted, some with cortical breakthrough posteriorly. The trachea and main bronchi are clear. Central airways are small in caliber, which could be due to active breathing or bronchospasm. LYMPH NODES: There are shotty mediastinal lymph nodes in the AP window, prevascular and subcarinal spaces up to 10 mm. Hilar adenopathy is not well evaluated without contrast. No axillary adenopathy is seen. LUNGS AND PLEURA: There are moderate-sized left greater than right pleural effusions. Some of the left pleural fluid could be loculated anterolaterally. The pleural effusions collapse and obscure the lower lobes on the left greater than right. There is posterior atelectasis in the upper and right middle lobes along the fissures. Linear atelectasis in the bases. There is multifocal lobular pleural thickening in both hemithoraces probably also due to myelomatous involvement affecting the rib cage, otherwise could be other pleural metastases. No pneumothorax. SOFT TISSUES/BONES: There is a 6 mm hypodense nodule in the left lobe of the thyroid . No follow-up imaging is recommended. Generalized edema of the chest wall. There are extensive lytic lesions present throughout the spine, sternum, ribs, and several in the scapulae. There are multiple fractures in various stages of healing of the bilateral ribs. There is a severe age indeterminate pathologic compression fracture of the T6 vertebral body and a moderate compression fracture of T4, at T6 with vertebra plana, retropulsion and soft tissue extending into the spinal canal, narrowing the thecal sac to 7 mm AP with likely mild compressive effect on the thoracic cord. . There is a mild upper plate pathologic wedge  compression fracture of the L2 vertebral body as well. UPPER ABDOMEN: Limited images of the upper abdomen demonstrate suspected multiple liver masses, not well seen due to beam hardening artifact. There is a nodular surface contour to the liver, which was also not seen previously. . On the last image, there is a vague 2.7 cm hypodense mass in segment 4, and a suspected 1.4 cm periligamentous mass in this segment also in the last image. There are probably other masses obscured by beam hardening. Further evaluation is recommended. IMPRESSION: 1. Moderate-sized left greater than right pleural effusions, with possible partial loculation of the left effusion, and associated multifocal lobular pleural thickening, likely related to myelomatous involvement or other pleural metastases, new since 02/12/2024. 2. Extensive osseous lytic disease with multiple bilateral rib fractures in various stages of healing , and age indeterminate . severe pathologic compression fracture of T6 with vertebra plana, retropulsion, and soft tissue extension into the spinal canal with likely mild thoracic cord compression; additional moderate T4 and mild L2 pathologic compression fractures are present, and thoracic spine MRI is recommended. 3. Suspected multiple liver masses, suboptimally evaluated due to beam hardening artifact, and contrast-enhanced CT or MRI of the abdomen is recommended  for further evaluation. 4. The pleural effusions collapse and obscure the lower lobes on the left greater than right. Underlying pneumonia or mass would be difficult to exclude. 5. Cardiomegaly with enlarged pulmonary trunk , prominent central veins , edema of the body wall. No pulmonary edema is seen. 6. Aortic and coronary artery atherosclerosis. Electronically signed by: Francis Quam MD 11/07/2024 08:40 PM EST RP Workstation: [id number]   DG Chest Port 1 View Result Date: 11/07/2024 CLINICAL DATA:  Shortness of breath EXAM: PORTABLE CHEST 1 VIEW  COMPARISON:  10/31/2024, PET CT 02/12/2024, chest x-ray 09/08/2024 FINDINGS: Cardiomegaly with vascular congestion. Small right and moderate left pleural effusions, increased compared to prior. Worsened airspace disease at both bases. Aortic atherosclerosis. No pneumothorax IMPRESSION: Cardiomegaly with vascular congestion. Small right and moderate left pleural effusions, increased compared to prior. Worsened airspace disease at both bases may be due to atelectasis or pneumonia. Electronically Signed   By: Luke Bun M.D.   On: 11/07/2024 15:54   DG CHEST PORT 1 VIEW Result Date: 10/31/2024 CLINICAL DATA:  Pleural effusion.  Multiple myeloma. EXAM: PORTABLE CHEST 1 VIEW COMPARISON:  10/26/2024 FINDINGS: Stable cardiomegaly and pulmonary vascular congestion. Stable mild pleural thickening or tiny pleural effusions. No significant change in left lower lobe atelectasis or scarring. No new or worsening areas of pulmonary opacity are seen. IMPRESSION: Stable cardiomegaly and pulmonary vascular congestion. Stable left lower lobe atelectasis or scarring. Stable tiny bilateral pleural effusions versus pleural thickening. Electronically Signed   By: Norleen DELENA Kil M.D.   On: 10/31/2024 09:54   ECHOCARDIOGRAM COMPLETE Result Date: 10/30/2024    ECHOCARDIOGRAM REPORT   Patient Name:   RAVAN SCHLEMMER [facility] Date of Exam: 10/30/2024 Medical Rec #:  [id number]      Height:       61.5 in Accession #:    [id number]     Weight:       204.1 lb Date of Birth:  [birth year]     BSA:          1.917 m Patient Age:    58 years       BP:           126/70 mmHg Patient Gender: F              HR:           86 bpm. Exam Location:  Inpatient Procedure: 2D Echo, Cardiac Doppler, Color Doppler and Intracardiac            Opacification Agent (Both Spectral and Color Flow Doppler were            utilized during procedure). Indications:    Atrial Flutter  History:        Patient has prior history of Echocardiogram examinations, most                  recent 12/05/2021.  Sonographer:    Philomena Daring Referring Phys: [id number] JOETTE PEBBLES IMPRESSIONS  1. Left ventricular ejection fraction, by estimation, is 60 to 65%. The left ventricle has normal function. The left ventricle has no regional wall motion abnormalities. Left ventricular diastolic parameters are indeterminate.  2. Right ventricular systolic function is normal. The right ventricular size is mildly enlarged. There is mildly elevated pulmonary artery systolic pressure. The estimated right ventricular systolic pressure is 39.9 mmHg.  3. Large pleural effusion in the left lateral region.  4. The mitral valve was not well visualized. No evidence of mitral valve regurgitation.  5. The aortic  valve is tricuspid. Aortic valve regurgitation is not visualized. No aortic stenosis is present.  6. The inferior vena cava is dilated in size with <50% respiratory variability, suggesting right atrial pressure of 15 mmHg. Comparison(s): Prior images reviewed side by side. 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 FINDINGS  Left Ventricle: Left ventricular ejection fraction, by estimation, is 60 to 65%. The left ventricle has normal function. The left ventricle has no regional wall motion abnormalities. The left ventricular internal cavity size was normal in size. There is  no left ventricular hypertrophy. Left ventricular diastolic parameters are indeterminate. Right Ventricle: The right ventricular size is mildly enlarged. No increase in right ventricular wall thickness. Right ventricular systolic function is normal. There is mildly elevated pulmonary artery systolic pressure. The tricuspid regurgitant velocity is 2.23 m/s, and with an assumed right atrial pressure of 20 mmHg, the estimated right ventricular systolic pressure is 39.9 mmHg. Left Atrium: Left atrial size was normal in size. Right Atrium: Right atrial size was normal in size. Pericardium: There is no evidence of pericardial effusion. Mitral Valve:  The mitral valve was not well visualized. No evidence of mitral valve regurgitation. Tricuspid Valve: The tricuspid valve is normal in structure. Tricuspid valve regurgitation is trivial. No evidence of tricuspid stenosis. Aortic Valve: The aortic valve is tricuspid. Aortic valve regurgitation is not visualized. No aortic stenosis is present. Aortic valve mean gradient measures 9.0 mmHg. Aortic valve peak gradient measures 18.6 mmHg. Aortic valve area, by VTI measures 2.62  cm. Pulmonic Valve: The pulmonic valve was normal in structure. Pulmonic valve regurgitation is trivial. No evidence of pulmonic stenosis. Aorta: The aortic root and ascending aorta are structurally normal, with no evidence of dilitation. Venous: The inferior vena cava is dilated in size with less than 50% respiratory variability, suggesting right atrial pressure of 15 mmHg. IAS/Shunts: No atrial level shunt detected by color flow Doppler. Additional Comments: There is a large pleural effusion in the left lateral region.  LEFT VENTRICLE PLAX 2D LVIDd:         5.00 cm   Diastology LVIDs:         3.40 cm   LV e' medial:    7.72 cm/s LV PW:         1.00 cm   LV E/e' medial:  12.9 LV IVS:        0.90 cm   LV e' lateral:   11.00 cm/s LVOT diam:     2.00 cm   LV E/e' lateral: 9.1 LV SV:         87 LV SV Index:   45 LVOT Area:     3.14 cm  RIGHT VENTRICLE             IVC RV S prime:     20.40 cm/s  IVC diam: 2.60 cm TAPSE (M-mode): 3.2 cm LEFT ATRIUM             Index        RIGHT ATRIUM           Index LA Vol (A2C):   53.7 ml 28.01 ml/m  RA Area:     17.00 cm LA Vol (A4C):   34.2 ml 17.84 ml/m  RA Volume:   39.90 ml  20.82 ml/m LA Biplane Vol: 43.6 ml 22.75 ml/m  AORTIC VALVE AV Area (Vmax):    2.51 cm AV Area (Vmean):   2.50 cm AV Area (VTI):     2.62 cm AV Vmax:  215.50 cm/s AV Vmean:          137.500 cm/s AV VTI:            0.333 m AV Peak Grad:      18.6 mmHg AV Mean Grad:      9.0 mmHg LVOT Vmax:         172.00 cm/s LVOT Vmean:         109.500 cm/s LVOT VTI:          0.278 m LVOT/AV VTI ratio: 0.83  AORTA Ao Root diam: 3.20 cm Ao Asc diam:  3.00 cm MITRAL VALVE                TRICUSPID VALVE MV Area (PHT): 4.71 cm     TR Peak grad:   19.9 mmHg MV Decel Time: 161 msec     TR Vmax:        223.00 cm/s MV E velocity: 99.80 cm/s MV A velocity: 101.00 cm/s  SHUNTS MV E/A ratio:  0.99         Systemic VTI:  0.28 m                             Systemic Diam: 2.00 cm Stanly Leavens MD Electronically signed by Stanly Leavens MD Signature Date/Time: 10/30/2024/3:14:55 PM    Final    IR BONE MARROW BIOPSY & ASPIRATION Result Date: 10/27/2024 INDICATION: Myeloma, reassess marrow status EXAM: FLUOROSCOPICALLY GUIDED RIGHT ILIAC BONE MARROW ASPIRATION AND CORE BIOPSY Date:  10/27/2024 10/27/2024 10:19 am Radiologist:  CHRISTELLA. Frederic Specking, MD Guidance: Fluoroscopy FLUOROSCOPY: Fluoroscopy Time:  (56 mGy). MEDICATIONS: 1% lidocaine  local ANESTHESIA/SEDATION: 1.5 mg IV Versed ; 100 mcg IV Fentanyl  Moderate Sedation Time:  11 minutes The patient was continuously monitored during the procedure by the interventional radiology nurse under my direct supervision. CONTRAST:  None. COMPLICATIONS: None immediate PROCEDURE: Informed consent was obtained from the patient following explanation of the procedure, risks, benefits and alternatives. The patient understands, agrees and consents for the procedure. All questions were addressed. A time out was performed. The patient was positioned prone and fluoroscopic localization was performed of the pelvis to demonstrate the iliac marrow spaces. Maximal barrier sterile technique utilized including caps, mask, sterile gowns, sterile gloves, large sterile drape, hand hygiene, and Betadine prep. Under sterile conditions and local anesthesia, an 11 gauge coaxial bone biopsy needle was advanced into the right iliac marrow space. Needle position was confirmed with fluoroscopic imaging. Initially, bone marrow aspiration  was performed. Next, the 11 gauge outer cannula was utilized to obtain a right iliac bone marrow core biopsy. Needle was removed. Hemostasis was obtained with compression. The patient tolerated the procedure well. Samples were prepared with the cytotechnologist. No immediate complications. IMPRESSION: Fluoroscopically guided right iliac bone marrow aspiration and core biopsy. Electronically Signed   By: CHRISTELLA.  Shick M.D.   On: 10/27/2024 10:31     Subjective: No acute issues or events overnight denies nausea vomiting diarrhea constipation headache fevers chills or chest pain   Discharge Exam: Vitals:   11/25/24 1014 11/25/24 1427  BP: 115/85 121/63  Pulse:  70  Resp:  18  Temp:  98.8 F (37.1 C)  SpO2:  97%   Vitals:   11/25/24 0546 11/25/24 0746 11/25/24 1014 11/25/24 1427  BP: (!) 141/72  115/85 121/63  Pulse: 77   70  Resp: 18   18  Temp: 98.3 F (36.8 C)   98.8 F (  37.1 C)  TempSrc:    Oral  SpO2: 95% 98%  97%  Weight:      Height:       General:  Pleasantly resting in bed, No acute distress. HEENT:  Normocephalic atraumatic.   Lungs:  Clear to auscultate bilaterally without rhonchi, wheeze, or rales. Heart:  Regular rate and rhythm.   Abdomen:  Soft, obese, nontender, nondistended  The results of significant diagnostics from this hospitalization (including imaging, microbiology, ancillary and laboratory) are listed below for reference.    Microbiology: No results found for this or any previous visit (from the past 240 hours).   Labs:  Basic Metabolic Panel: Recent Labs  Lab 11/20/24 0613 11/22/24 0532 11/24/24 0628  NA 136 137 139  K 4.3 4.0 3.6  CL 102 104 106  CO2 24 26 27   GLUCOSE 162* 97 96  BUN 18 12 12   CREATININE 0.56 0.42* 0.38*  CALCIUM  8.2* 7.9* 8.1*  MG 1.9  --  1.9  PHOS  --   --  3.1   Liver Function Tests: Recent Labs  Lab 11/20/24 0613  AST 31  ALT 36  ALKPHOS 118  BILITOT 0.4  PROT 6.1*  ALBUMIN 3.4*   CBC: Recent Labs  Lab  11/20/24 0613 11/22/24 0532 11/24/24 0628  WBC 9.2 5.3 4.3  HGB 9.0* 8.0* 7.6*  HCT 27.6* 25.4* 23.8*  MCV 87.9 89.1 88.5  PLT 42* 37* 39*   Urinalysis    Component Value Date/Time   COLORURINE YELLOW 09/08/2024 1728   APPEARANCEUR CLOUDY (A) 09/08/2024 1728   LABSPEC 1.020 09/08/2024 1728   PHURINE 5.0 09/08/2024 1728   GLUCOSEU NEGATIVE 09/08/2024 1728   HGBUR NEGATIVE 09/08/2024 1728   BILIRUBINUR NEGATIVE 09/08/2024 1728   BILIRUBINUR NEG 12/29/2013 0900   KETONESUR NEGATIVE 09/08/2024 1728   PROTEINUR 30 (A) 09/08/2024 1728   UROBILINOGEN 0.2 12/29/2013 0900   UROBILINOGEN 0.2 03/07/2011 1430   NITRITE NEGATIVE 09/08/2024 1728   LEUKOCYTESUR NEGATIVE 09/08/2024 1728   Sepsis Labs Recent Labs  Lab 11/20/24 0613 11/22/24 0532 11/24/24 0628  WBC 9.2 5.3 4.3   Microbiology No results found for this or any previous visit (from the past 240 hours).   Time coordinating discharge: Over 30 minutes  SIGNED:   Elsie JAYSON [place]clair, DO [facility] 11/25/2024, 2:47 PM Pager   If 7PM-7AM, please contact night-coverage [email or web address]     [1]  Allergies Allergen Reactions   Latuda  [Lurasidone ] Other (See Comments)    Made leg muscles twitch (went off this herself in December, 2017)

## 2024-11-25 NOTE — Progress Notes (Signed)
 Patient informed of discharge order, states she cannot get home today as she has no one at home. Per [facility], they have discussed getting her transportation. Patient states she cannot get into the house until tomorrow morning when her mom can get there. patient states she will see if she can get someone to come unlock her door that she cannot get in her door right now. Informed patient to let us  know when she got in contact with her mom or someone that can unlock her door and we can arrange transportation.

## 2024-11-25 NOTE — Plan of Care (Signed)

## 2024-11-25 NOTE — Progress Notes (Signed)
 1700 called [facility] for transportation, reviewed AVS with patient and patients mother along with new medications

## 2024-11-25 NOTE — Progress Notes (Signed)
 "                                                                                                                                                                                                          Daily Progress Note   Patient Name: Carla Hanson       Date: 11/25/2024 DOB: 03-Sep-1966  Age: 59 y.o. MRN#: [id number] Attending Physician: Lue Elsie BROCKS, MD Primary Care Physician: Anders Otto DASEN, MD Admit Date: 11/07/2024  Reason for Consultation/Follow-up: Establishing goals of care  Subjective: Awake alert, resting in bed, she states she is awaiting discharge, wants to go back home, has declined SNF rehab, asks about further oncology follow up.   PMT ambulatory referral to palliative care has also been set up. Discussed with patient.   Pain management options also reviewed with the patient.    Length of Stay: 18  Current Medications: Scheduled Meds:   acyclovir   400 mg Oral BID   amiodarone   200 mg Oral BID   dexamethasone   4 mg Oral Daily   diltiazem   240 mg Oral Daily   ferrous sulfate   325 mg Oral Q breakfast   fluticasone  furoate-vilanterol  1 puff Inhalation Daily   folic acid   1 mg Oral BID   loratadine   10 mg Oral Daily   polyethylene glycol  17 g Oral Daily   potassium chloride   20 mEq Oral Daily   thiamine   100 mg Oral Daily    Continuous Infusions:   PRN Meds: acetaminophen  **OR** acetaminophen , albuterol , alum & mag hydroxide-simeth, bisacodyl , mouth rinse, oxyCODONE , Zinc  Oxide  Physical Exam         Awake alert oriented.  No distress Has peripheral edema - decreasing.  Generalized weakness Regular work of breathing.   Vital Signs: BP 121/63 (BP Location: Right Arm)   Pulse 70   Temp 98.8 F (37.1 C) (Oral)   Resp 18   Ht 5' 1.5 (1.562 m)   Wt 92.6 kg   LMP  (LMP Unknown)   SpO2 97%   BMI 37.95 kg/m  SpO2: SpO2: 97 % O2 Device: O2 Device: Room Air O2 Flow Rate: O2 Flow Rate (L/min): (S) 3 L/min (down to 1 lpm post tx- rn  aware)  Intake/output summary:  Intake/Output Summary (Last 24 hours) at 11/25/2024 1610 Last data filed at 11/25/2024 0600 Gross per 24 hour  Intake 240 ml  Output 500 ml  Net -260 ml   LBM: Last BM Date : 11/24/24 Baseline Weight: Weight: 92.6 kg Most recent weight: Weight: 92.6 kg  Palliative Assessment/Data:      Patient Active Problem List   Diagnosis Date Noted   Medication management 11/21/2024   Pressure injury of skin 11/10/2024   Atrial flutter (HCC) 11/10/2024   Compression fracture of T6 vertebra (HCC) 11/08/2024   Acute on chronic heart failure with preserved ejection fraction (HFpEF) (HCC) 11/08/2024   Acute respiratory failure with hypoxia (HCC) 11/07/2024   Pleural effusion 11/07/2024   Need for emotional support 11/03/2024   Pain 11/02/2024   Counseling and coordination of care 11/02/2024   Goals of care, counseling/discussion 11/02/2024   Palliative care encounter 11/02/2024   Thrombocytopenia 10/23/2024   Anemia due to antineoplastic agent 10/21/2024   Counseling regarding advance care planning and goals of care 10/21/2024   Symptomatic anemia 10/20/2024   Moderate protein-calorie malnutrition 09/21/2024   Chemotherapy-induced neutropenia 09/09/2024   Folate deficiency 09/09/2024   Pancytopenia (HCC) 09/08/2024   History of compression fracture of spine 04/12/2024   Electrolyte abnormality 02/10/2024   Edema leg 02/10/2024   Hypercalcemia of malignancy 02/02/2024   Multiple myeloma not having achieved remission (HCC) 02/02/2024   Hypokalemia 01/30/2024   BMI 40.0-44.9, adult (HCC) 12/18/2022   Chronic pain of both shoulders 09/05/2022   Vitamin D  deficiency 12/19/2020   Palpitation 12/03/2019   History of intracranial hemorrhage 08/02/2019   History of DVT of lower extremity 08/02/2019   History of seizure 08/02/2019   COPD, moderate (HCC) 12/11/2018   Anemia 02/11/2018   History of hemorrhagic cerebrovascular accident (CVA) without  residual deficits 08/13/2017   Iron deficiency anemia 12/12/2016   Pre-diabetes 03/02/2013   History of pulmonary embolism 05/05/2012   Former tobacco use 08/23/2009   Hyperlipidemia 04/30/2007   Essential hypertension 01/01/2007    Palliative Care Assessment & Plan   Patient Profile:    Assessment: 60 year old female history of obesity, HTN, COPD, prior DVT and SAH no longer anticoagulation,  relapsed refractory multiple myeloma who comes into the hospital with shortness of breath, found to have pleural effusion, also found to have worsening T6 compression fracture.    Currently has completed XRT  PMT for pain and GOC.   Recommendations/Plan: Cancer-related pain / T6 compression fracture Patient is completed the final session of radiation therapy for T6 compression fracture. Pain regimen was reviewed in detail with the patient. She currently has oxycodone  IR PO as needed for baseline pain control and IV hydromorphone  as needed for breakthrough pain while inpatient. Discussed appropriate use of oral versus IV medications and expectations for pain control. Patient verbalized understanding. Outpatient PMT follow up arranged.  Goals of care / Code status  Patient remains full code with full scope of treatment at this time, and this was discussed and confirmed.  Disposition planning / Supportive care needs Home with home health care Outpatient palliative support at the cancer center     Code Status:    Code Status Orders  (From admission, onward)           Start     Ordered   11/07/24 2056  Full code  Continuous       Question:  By:  Answer:  Consent: discussion documented in EHR   11/07/24 2056           Code Status History     Date Active Date Inactive Code Status Order ID Comments User Context   10/20/2024 1050 11/05/2024 2300 Full Code [id number]  Zella Katha HERO, MD ED   09/08/2024 1708 09/12/2024 1738 Full Code [id number]  Sebastian Sieving  V, MD ED   01/30/2024  2047 02/05/2024 2004 Full Code [id number]  Tobie Jorie SAUNDERS, MD Inpatient   11/08/2016 2132 11/13/2016 2053 Full Code [id number]  Michaela Aisha SQUIBB, MD ED   10/31/2016 2025 11/03/2016 1615 Full Code [id number]  Drusilla Sabas RAMAN, MD Inpatient   05/05/2012 0158 05/05/2012 2331 Full Code [id number]  Gwenyth Dayton BIRCH, MD Inpatient       Prognosis:  Unable to determine  Discharge Planning: Home with home health.   Care plan was discussed with  patient.  Onc and [facility]  Thank you for allowing the Palliative Medicine Team to assist in the care of this patient. I personally spent a total of 35 minutes in the care of the patient today including preparing to see the patient, getting/reviewing separately obtained history, performing a medically appropriate exam/evaluation, counseling and educating, and coordinating care.      Greater than 50%  of this time was spent counseling and coordinating care related to the above assessment and plan.  Lonia Serve, MD  Please contact Palliative Medicine Team phone at [phone] for questions and concerns.       "

## 2024-11-25 NOTE — Plan of Care (Signed)

## 2024-11-26 ENCOUNTER — Telehealth: Payer: Self-pay

## 2024-11-26 NOTE — Transitions of Care (Post Inpatient/ED Visit) (Signed)
" ° °  11/26/2024  Name: Carla Hanson MRN: [id number] DOB: May 24, 1966  Today's TOC FU Call Status:    Attempted to reach the patient regarding the most recent Inpatient/ED visit.  Follow Up Plan: Additional outreach attempts will be made to reach the patient to complete the Transitions of Care (Post Inpatient/ED visit) call.    Bing Edison MSN, RN RN [employer]  [facility] Office Hours M-F [phone] Direct Dial: [phone] Main Phone [phone]  Fax: [phone] [email or web address]    "

## 2024-11-28 ENCOUNTER — Other Ambulatory Visit: Payer: Self-pay

## 2024-11-29 ENCOUNTER — Other Ambulatory Visit: Payer: Self-pay | Admitting: Hematology

## 2024-11-29 ENCOUNTER — Telehealth: Payer: Self-pay

## 2024-11-29 ENCOUNTER — Other Ambulatory Visit (HOSPITAL_COMMUNITY): Payer: Self-pay

## 2024-11-29 ENCOUNTER — Encounter: Payer: Self-pay | Admitting: Hematology

## 2024-11-29 DIAGNOSIS — C9000 Multiple myeloma not having achieved remission: Secondary | ICD-10-CM

## 2024-11-29 MED ORDER — POTASSIUM CHLORIDE CRYS ER 20 MEQ PO TBCR
20.0000 meq | EXTENDED_RELEASE_TABLET | Freq: Two times a day (BID) | ORAL | 0 refills | Status: AC
  Filled 2024-11-29: qty 60, 30d supply, fill #0

## 2024-11-29 NOTE — Transitions of Care (Post Inpatient/ED Visit) (Signed)
" ° °  11/29/2024  Name: Carla Hanson MRN: [id number] DOB: [birth year]  Today's TOC FU Call Status: Today's TOC FU Call Status:: Unsuccessful Call (2nd Attempt) Unsuccessful Call (2nd Attempt) Date: 11/29/24  Attempted to reach the patient regarding the most recent Inpatient/ED visit.  Follow Up Plan: Additional outreach attempts will be made to reach the patient to complete the Transitions of Care (Post Inpatient/ED visit) call.    Bing Edison MSN, RN RN [employer]  [facility] Office Hours M-F [phone] Direct Dial: [phone] Main Phone [phone]  Fax: [phone] [email or web address]    "

## 2024-11-30 ENCOUNTER — Other Ambulatory Visit: Payer: Self-pay

## 2024-11-30 ENCOUNTER — Telehealth: Admitting: Student

## 2024-11-30 ENCOUNTER — Ambulatory Visit: Admitting: Family Medicine

## 2024-11-30 DIAGNOSIS — D61818 Other pancytopenia: Secondary | ICD-10-CM | POA: Diagnosis not present

## 2024-11-30 DIAGNOSIS — E876 Hypokalemia: Secondary | ICD-10-CM

## 2024-11-30 DIAGNOSIS — J90 Pleural effusion, not elsewhere classified: Secondary | ICD-10-CM | POA: Diagnosis present

## 2024-11-30 DIAGNOSIS — R7303 Prediabetes: Secondary | ICD-10-CM | POA: Diagnosis not present

## 2024-11-30 NOTE — Assessment & Plan Note (Signed)
 Patient due for A1c  Order with above lab work  Follow up with PCP for diabetes control  Due to patient being on steroid taper, anticipate elevated A1c

## 2024-11-30 NOTE — Assessment & Plan Note (Signed)
 No symptoms of anemia on exam  Recheck CBC w diff this week Patient scheduled for lab appointment

## 2024-11-30 NOTE — Progress Notes (Signed)
  [facility] Telemedicine Visit  Patient consented to have virtual visit and was identified by name and date of birth. Method of visit: Video  Encounter participants: Patient: Carla Hanson - located at home Provider: Damien Pinal - located at home   Chief Complaint: Hospital Follow up  HPI: Recently admitted from 1/4-1/22 for SOB 2/2 to pleural effusion. PMHx includes refractory and relapsed multiple myeloma, HTN, COPD, prior DVT and SAH.   Prior to discharge she completed 5 days of Rocephin  and doxycycline  and was discharged home on RA.   Found to have T6 compression fracture, discharged with Decadron  taper.   Today she is feeling well and feels her breathing has improved even more so. She does not require O2 at home. No reported orthopnea or lower extremity swelling. She was not discharged home on Lasix .   She has follow up with her oncologist scheduled in February.   ROS: per HPI  Pertinent PMHx: as above   Current Medications:  Acyclovir  400 mg BID Albuterol  neb as needed  Amiodarone  200 mg BID  Dexamethazone taper  Diltiazem  240 mg daily  Ferrous sulfate  325 mg daily  Loratadine  10 mg daily  Potassium 20 mEq BID  Symbicort  160-4.5 mcg/act daily  Thiamine  500 mg daily   Exam:  LMP  (LMP Unknown)   Respiratory: speaking in complete sentences, no signs of respiratory distress on observation   Assessment & Plan Pleural effusion Appears clinically stable on interview with improvement in work of breathing  Strict return precautions discussed with patient  Reviewed discharge medications with the patient  Pancytopenia (HCC) No symptoms of anemia on exam  Recheck CBC w diff this week Patient scheduled for lab appointment  Hypokalemia Hypercalcemia of malignancy Normal electrolytes at discharge  Patient currently taking potassium supplementation  Recheck BMP this week  Pre-diabetes Patient due for A1c  Order with above lab work  Follow up  with PCP for diabetes control  Due to patient being on steroid taper, anticipate elevated A1c     Time spent during visit with patient: 30 minutes

## 2024-11-30 NOTE — Assessment & Plan Note (Addendum)
 Appears clinically stable on interview with improvement in work of breathing  Strict return precautions discussed with patient  Reviewed discharge medications with the patient

## 2024-11-30 NOTE — Assessment & Plan Note (Signed)
 Normal electrolytes at discharge  Patient currently taking potassium supplementation  Recheck BMP this week

## 2024-12-01 ENCOUNTER — Telehealth: Payer: Self-pay

## 2024-12-01 NOTE — Transitions of Care (Post Inpatient/ED Visit) (Signed)
" ° °  12/01/2024  Name: AMALEE OLSEN MRN: [id number] DOB: 11-Oct-1966  Today's TOC FU Call Status: Today's TOC FU Call Status:: Unsuccessful Call (3rd Attempt) Unsuccessful Call (3rd Attempt) Date: 12/01/24  Attempted to reach the patient regarding the most recent Inpatient/ED visit.  Follow Up Plan: No further outreach attempts will be made at this time. We have been unable to contact the patient.   Bing Edison MSN, RN RN [employer]  [facility] Office Hours M-F [phone] Direct Dial: [phone] Main Phone [phone]  Fax: [phone] [email or web address]    "

## 2024-12-02 ENCOUNTER — Other Ambulatory Visit: Payer: Self-pay

## 2024-12-09 ENCOUNTER — Other Ambulatory Visit (HOSPITAL_COMMUNITY): Payer: Self-pay

## 2024-12-16 ENCOUNTER — Encounter (HOSPITAL_COMMUNITY)

## 2024-12-20 ENCOUNTER — Inpatient Hospital Stay

## 2024-12-20 ENCOUNTER — Inpatient Hospital Stay: Attending: Hematology | Admitting: Hematology

## 2024-12-28 ENCOUNTER — Ambulatory Visit: Admitting: Radiology

## 2025-01-05 ENCOUNTER — Inpatient Hospital Stay: Attending: Hematology
